# Patient Record
Sex: Male | Born: 1975 | Race: Black or African American | Hispanic: No | Marital: Single | Smoking: Former smoker
Health system: Southern US, Community
[De-identification: ages and names within clinical notes are randomized; demographics above are authoritative.]

## PROBLEM LIST (undated history)

## (undated) DIAGNOSIS — K219 Gastro-esophageal reflux disease without esophagitis: Secondary | ICD-10-CM

## (undated) DIAGNOSIS — N183 Chronic kidney disease, stage 3 unspecified: Secondary | ICD-10-CM

## (undated) DIAGNOSIS — I10 Essential (primary) hypertension: Secondary | ICD-10-CM

## (undated) DIAGNOSIS — R0600 Dyspnea, unspecified: Secondary | ICD-10-CM

## (undated) DIAGNOSIS — K859 Acute pancreatitis without necrosis or infection, unspecified: Secondary | ICD-10-CM

## (undated) DIAGNOSIS — I219 Acute myocardial infarction, unspecified: Secondary | ICD-10-CM

## (undated) DIAGNOSIS — I509 Heart failure, unspecified: Secondary | ICD-10-CM

## (undated) DIAGNOSIS — J449 Chronic obstructive pulmonary disease, unspecified: Secondary | ICD-10-CM

## (undated) DIAGNOSIS — I471 Supraventricular tachycardia, unspecified: Secondary | ICD-10-CM

## (undated) DIAGNOSIS — G4730 Sleep apnea, unspecified: Secondary | ICD-10-CM

## (undated) DIAGNOSIS — I214 Non-ST elevation (NSTEMI) myocardial infarction: Secondary | ICD-10-CM

## (undated) DIAGNOSIS — E669 Obesity, unspecified: Secondary | ICD-10-CM

## (undated) DIAGNOSIS — M109 Gout, unspecified: Secondary | ICD-10-CM

## (undated) DIAGNOSIS — I700 Atherosclerosis of aorta: Secondary | ICD-10-CM

## (undated) DIAGNOSIS — Z789 Other specified health status: Secondary | ICD-10-CM

## (undated) DIAGNOSIS — I428 Other cardiomyopathies: Secondary | ICD-10-CM

## (undated) DIAGNOSIS — Z95810 Presence of automatic (implantable) cardiac defibrillator: Secondary | ICD-10-CM

## (undated) DIAGNOSIS — E119 Type 2 diabetes mellitus without complications: Secondary | ICD-10-CM

## (undated) DIAGNOSIS — R0902 Hypoxemia: Secondary | ICD-10-CM

## (undated) DIAGNOSIS — I2510 Atherosclerotic heart disease of native coronary artery without angina pectoris: Secondary | ICD-10-CM

## (undated) DIAGNOSIS — I480 Paroxysmal atrial fibrillation: Secondary | ICD-10-CM

## (undated) DIAGNOSIS — J9610 Chronic respiratory failure, unspecified whether with hypoxia or hypercapnia: Secondary | ICD-10-CM

## (undated) DIAGNOSIS — J45909 Unspecified asthma, uncomplicated: Secondary | ICD-10-CM

## (undated) DIAGNOSIS — G4733 Obstructive sleep apnea (adult) (pediatric): Secondary | ICD-10-CM

## (undated) DIAGNOSIS — I420 Dilated cardiomyopathy: Secondary | ICD-10-CM

## (undated) DIAGNOSIS — K449 Diaphragmatic hernia without obstruction or gangrene: Secondary | ICD-10-CM

## (undated) DIAGNOSIS — E785 Hyperlipidemia, unspecified: Secondary | ICD-10-CM

## (undated) DIAGNOSIS — N189 Chronic kidney disease, unspecified: Secondary | ICD-10-CM

## (undated) DIAGNOSIS — Y639 Failure in dosage during unspecified surgical and medical care: Secondary | ICD-10-CM

## (undated) DIAGNOSIS — I499 Cardiac arrhythmia, unspecified: Secondary | ICD-10-CM

## (undated) DIAGNOSIS — Z9889 Other specified postprocedural states: Secondary | ICD-10-CM

## (undated) DIAGNOSIS — H9191 Unspecified hearing loss, right ear: Secondary | ICD-10-CM

## (undated) DIAGNOSIS — I5020 Unspecified systolic (congestive) heart failure: Secondary | ICD-10-CM

## (undated) DIAGNOSIS — I429 Cardiomyopathy, unspecified: Secondary | ICD-10-CM

## (undated) DIAGNOSIS — I447 Left bundle-branch block, unspecified: Secondary | ICD-10-CM

## (undated) HISTORY — PX: IMPLANTABLE CARDIOVERTER DEFIBRILLATOR IMPLANT: SHX5860

## (undated) HISTORY — DX: Unspecified asthma, uncomplicated: J45.909

## (undated) HISTORY — DX: Hyperlipidemia, unspecified: E78.5

## (undated) HISTORY — PX: TONSILLECTOMY: SUR1361

## (undated) HISTORY — PX: INSERT / REPLACE / REMOVE PACEMAKER: SUR710

## (undated) HISTORY — DX: Unspecified hearing loss, right ear: H91.91

## (undated) HISTORY — DX: Type 2 diabetes mellitus without complications: E11.9

---

## 2006-01-31 ENCOUNTER — Emergency Department: Payer: Self-pay | Admitting: Internal Medicine

## 2006-04-23 ENCOUNTER — Emergency Department: Payer: Self-pay | Admitting: Emergency Medicine

## 2009-12-13 ENCOUNTER — Inpatient Hospital Stay: Payer: Self-pay | Admitting: Internal Medicine

## 2009-12-14 HISTORY — PX: RIGHT/LEFT HEART CATH AND CORONARY ANGIOGRAPHY: CATH118266

## 2011-01-30 ENCOUNTER — Inpatient Hospital Stay: Payer: Self-pay | Admitting: Internal Medicine

## 2012-02-06 ENCOUNTER — Inpatient Hospital Stay: Payer: Self-pay | Admitting: Internal Medicine

## 2012-02-06 LAB — COMPREHENSIVE METABOLIC PANEL
Albumin: 3.5 g/dL (ref 3.4–5.0)
Alkaline Phosphatase: 179 U/L — ABNORMAL HIGH (ref 50–136)
Anion Gap: 9 (ref 7–16)
BUN: 12 mg/dL (ref 7–18)
Calcium, Total: 8.3 mg/dL — ABNORMAL LOW (ref 8.5–10.1)
Chloride: 103 mmol/L (ref 98–107)
EGFR (African American): >60
Glucose: 110 mg/dL — ABNORMAL HIGH (ref 65–99)
Osmolality: 278 (ref 275–301)
Potassium: 3.8 mmol/L (ref 3.5–5.1)
SGOT(AST): 62 U/L — ABNORMAL HIGH (ref 15–37)
Sodium: 139 mmol/L (ref 136–145)
Total Protein: 8.4 g/dL — ABNORMAL HIGH (ref 6.4–8.2)

## 2012-02-06 LAB — URINALYSIS, COMPLETE
Bilirubin,UR: NEGATIVE
Glucose,UR: NEGATIVE mg/dL (ref 0–75)
Ketone: NEGATIVE
Leukocyte Esterase: NEGATIVE
Ph: 5 (ref 4.5–8.0)
Protein: 100
Specific Gravity: 1.006 (ref 1.003–1.030)
WBC UR: 4 /HPF (ref 0–5)

## 2012-02-06 LAB — CBC
HCT: 46.1 % (ref 40.0–52.0)
HGB: 15.6 g/dL (ref 13.0–18.0)
MCH: 32.2 pg (ref 26.0–34.0)
MCHC: 33.8 g/dL (ref 32.0–36.0)
MCV: 95 fL (ref 80–100)
Platelet: 169 10*3/uL (ref 150–440)
RBC: 4.83 10*6/uL (ref 4.40–5.90)
RDW: 13.7 % (ref 11.5–14.5)
WBC: 6.7 10*3/uL (ref 3.8–10.6)

## 2012-02-06 LAB — TROPONIN I
Troponin-I: 0.1 ng/mL — ABNORMAL HIGH
Troponin-I: 0.09 ng/mL — ABNORMAL HIGH

## 2012-02-06 LAB — LIPASE, BLOOD: Lipase: 161 U/L (ref 73–393)

## 2012-02-06 LAB — PRO B NATRIURETIC PEPTIDE: B-Type Natriuretic Peptide: 405 pg/mL — ABNORMAL HIGH (ref 0–125)

## 2012-02-06 LAB — CK TOTAL AND CKMB (NOT AT ARMC)
CK-MB: 3.9 ng/mL — ABNORMAL HIGH (ref 0.5–3.6)
CK-MB: 4.2 ng/mL — ABNORMAL HIGH (ref 0.5–3.6)
CK-MB: 3.3 ng/mL (ref 0.5–3.6)

## 2012-02-07 LAB — LIPID PANEL
Cholesterol: 256 mg/dL — ABNORMAL HIGH (ref 0–200)
HDL Cholesterol: 40 mg/dL (ref 40–60)
Ldl Cholesterol, Calc: 203 mg/dL — ABNORMAL HIGH (ref 0–100)
VLDL Cholesterol, Calc: 13 mg/dL (ref 5–40)

## 2012-02-07 LAB — CBC WITH DIFFERENTIAL/PLATELET
Basophil %: 0 %
HGB: 16.4 g/dL (ref 13.0–18.0)
Lymphocyte %: 17.7 %
MCH: 32.3 pg (ref 26.0–34.0)
MCHC: 33.7 g/dL (ref 32.0–36.0)
Monocyte #: 0 x10 3/mm — ABNORMAL LOW (ref 0.2–1.0)
Monocyte %: 1 %
Neutrophil %: 81.3 %
Platelet: 204 10*3/uL (ref 150–440)
RBC: 5.07 10*6/uL (ref 4.40–5.90)

## 2012-02-07 LAB — COMPREHENSIVE METABOLIC PANEL
Alkaline Phosphatase: 176 U/L — ABNORMAL HIGH (ref 50–136)
Anion Gap: 7 (ref 7–16)
Bilirubin,Total: 0.3 mg/dL (ref 0.2–1.0)
Co2: 31 mmol/L (ref 21–32)
Creatinine: 0.84 mg/dL (ref 0.60–1.30)
EGFR (Non-African Amer.): >60
Glucose: 146 mg/dL — ABNORMAL HIGH (ref 65–99)
SGOT(AST): 47 U/L — ABNORMAL HIGH (ref 15–37)
SGPT (ALT): 83 U/L — ABNORMAL HIGH

## 2012-02-07 LAB — TSH: Thyroid Stimulating Horm: 0.275 u[IU]/mL — ABNORMAL LOW

## 2012-02-07 LAB — T4, FREE: Free Thyroxine: 0.99 ng/dL (ref 0.76–1.46)

## 2012-02-07 LAB — PROTIME-INR: Prothrombin Time: 13.5 secs (ref 11.5–14.7)

## 2012-02-07 LAB — HEMOGLOBIN A1C: Hemoglobin A1C: 6.6 % — ABNORMAL HIGH (ref 4.2–6.3)

## 2012-02-20 ENCOUNTER — Emergency Department: Payer: Self-pay | Admitting: Emergency Medicine

## 2012-02-20 LAB — CBC
HCT: 43.6 % (ref 40.0–52.0)
HGB: 14.6 g/dL (ref 13.0–18.0)
MCH: 32.3 pg (ref 26.0–34.0)
MCHC: 33.4 g/dL (ref 32.0–36.0)
Platelet: 160 10*3/uL (ref 150–440)
RDW: 13.4 % (ref 11.5–14.5)
WBC: 5.5 10*3/uL (ref 3.8–10.6)

## 2012-02-20 LAB — BASIC METABOLIC PANEL
Calcium, Total: 8.4 mg/dL — ABNORMAL LOW (ref 8.5–10.1)
Chloride: 103 mmol/L (ref 98–107)
Co2: 29 mmol/L (ref 21–32)
Creatinine: 1 mg/dL (ref 0.60–1.30)
EGFR (Non-African Amer.): >60
Osmolality: 282 (ref 275–301)
Potassium: 3.9 mmol/L (ref 3.5–5.1)
Sodium: 138 mmol/L (ref 136–145)

## 2012-02-20 LAB — CK TOTAL AND CKMB (NOT AT ARMC)
CK, Total: 162 U/L (ref 35–232)
CK-MB: 3.5 ng/mL (ref 0.5–3.6)

## 2012-02-25 ENCOUNTER — Emergency Department: Payer: Self-pay | Admitting: *Deleted

## 2012-02-25 LAB — CBC
MCH: 32.4 pg (ref 26.0–34.0)
MCHC: 33.8 g/dL (ref 32.0–36.0)
MCV: 96 fL (ref 80–100)
Platelet: 151 10*3/uL (ref 150–440)
RBC: 4.55 10*6/uL (ref 4.40–5.90)
RDW: 13.7 % (ref 11.5–14.5)

## 2012-02-25 LAB — COMPREHENSIVE METABOLIC PANEL
Albumin: 3.3 g/dL — ABNORMAL LOW (ref 3.4–5.0)
Alkaline Phosphatase: 135 U/L (ref 50–136)
Anion Gap: 5 — ABNORMAL LOW (ref 7–16)
Calcium, Total: 8.8 mg/dL (ref 8.5–10.1)
Co2: 29 mmol/L (ref 21–32)
Creatinine: 0.94 mg/dL (ref 0.60–1.30)
EGFR (African American): >60
EGFR (Non-African Amer.): >60
Osmolality: 280 (ref 275–301)
Potassium: 4.1 mmol/L (ref 3.5–5.1)

## 2012-02-25 LAB — URINALYSIS, COMPLETE
Bacteria: NONE SEEN
Bilirubin,UR: NEGATIVE
Blood: NEGATIVE
Ketone: NEGATIVE
Ph: 5 (ref 4.5–8.0)
Protein: 100
Specific Gravity: 1.016 (ref 1.003–1.030)
Squamous Epithelial: 1

## 2012-02-25 LAB — TROPONIN I: Troponin-I: 0.1 ng/mL — ABNORMAL HIGH

## 2012-12-19 ENCOUNTER — Inpatient Hospital Stay: Payer: Self-pay | Admitting: Internal Medicine

## 2012-12-19 LAB — CK TOTAL AND CKMB (NOT AT ARMC)
CK, Total: 387 U/L — ABNORMAL HIGH (ref 35–232)
CK, Total: 257 U/L — ABNORMAL HIGH (ref 35–232)
CK-MB: 2.8 ng/mL (ref 0.5–3.6)
CK-MB: 2.9 ng/mL (ref 0.5–3.6)

## 2012-12-19 LAB — TROPONIN I
Troponin-I: 0.19 ng/mL — ABNORMAL HIGH
Troponin-I: 0.14 ng/mL — ABNORMAL HIGH
Troponin-I: 0.12 ng/mL — ABNORMAL HIGH

## 2012-12-19 LAB — COMPREHENSIVE METABOLIC PANEL
Alkaline Phosphatase: 116 U/L (ref 50–136)
Anion Gap: 5 — ABNORMAL LOW (ref 7–16)
Bilirubin,Total: 0.2 mg/dL (ref 0.2–1.0)
Co2: 30 mmol/L (ref 21–32)
Creatinine: 0.85 mg/dL (ref 0.60–1.30)
EGFR (Non-African Amer.): >60
Osmolality: 284 (ref 275–301)
Potassium: 3.6 mmol/L (ref 3.5–5.1)
SGPT (ALT): 46 U/L (ref 12–78)
Total Protein: 7.8 g/dL (ref 6.4–8.2)

## 2012-12-19 LAB — CBC
HCT: 42.4 % (ref 40.0–52.0)
MCH: 33 pg (ref 26.0–34.0)
MCHC: 35.1 g/dL (ref 32.0–36.0)
MCV: 94 fL (ref 80–100)
Platelet: 187 10*3/uL (ref 150–440)
RBC: 4.51 10*6/uL (ref 4.40–5.90)
WBC: 6.1 10*3/uL (ref 3.8–10.6)

## 2012-12-19 LAB — LIPASE, BLOOD: Lipase: 2864 U/L — ABNORMAL HIGH (ref 73–393)

## 2012-12-19 LAB — LIPID PANEL: VLDL Cholesterol, Calc: 30 mg/dL (ref 5–40)

## 2013-01-13 ENCOUNTER — Ambulatory Visit: Payer: Self-pay | Admitting: Adult Health

## 2013-03-15 ENCOUNTER — Ambulatory Visit: Payer: Self-pay | Admitting: Adult Health

## 2013-07-19 ENCOUNTER — Emergency Department: Payer: Self-pay | Admitting: Emergency Medicine

## 2014-04-14 ENCOUNTER — Emergency Department: Payer: Self-pay | Admitting: Emergency Medicine

## 2014-04-14 LAB — CBC
HCT: 46.4 % (ref 40.0–52.0)
HGB: 15.7 g/dL (ref 13.0–18.0)
MCH: 32.6 pg (ref 26.0–34.0)
MCHC: 33.7 g/dL (ref 32.0–36.0)
MCV: 97 fL (ref 80–100)
PLATELETS: 192 10*3/uL (ref 150–440)
RBC: 4.8 10*6/uL (ref 4.40–5.90)
RDW: 13.6 % (ref 11.5–14.5)
WBC: 6 10*3/uL (ref 3.8–10.6)

## 2014-04-14 LAB — BASIC METABOLIC PANEL
Anion Gap: 11 (ref 7–16)
BUN: 16 mg/dL (ref 7–18)
CO2: 28 mmol/L (ref 21–32)
CREATININE: 0.88 mg/dL (ref 0.60–1.30)
Calcium, Total: 8.5 mg/dL (ref 8.5–10.1)
Chloride: 100 mmol/L (ref 98–107)
EGFR (African American): >60
EGFR (Non-African Amer.): >60
Glucose: 97 mg/dL (ref 65–99)
OSMOLALITY: 279 (ref 275–301)
POTASSIUM: 3.5 mmol/L (ref 3.5–5.1)
Sodium: 139 mmol/L (ref 136–145)

## 2014-04-14 LAB — HEPATIC FUNCTION PANEL A (ARMC)
ALT: 42 U/L
AST: 46 U/L — AB (ref 15–37)
Albumin: 3.5 g/dL (ref 3.4–5.0)
Alkaline Phosphatase: 119 U/L — ABNORMAL HIGH
Bilirubin, Direct: 0.1 mg/dL (ref 0.00–0.20)
Bilirubin,Total: 0.4 mg/dL (ref 0.2–1.0)
TOTAL PROTEIN: 8.7 g/dL — AB (ref 6.4–8.2)

## 2014-04-14 LAB — LIPASE, BLOOD: Lipase: 195 U/L (ref 73–393)

## 2014-04-14 LAB — TROPONIN I
Troponin-I: 0.1 ng/mL — ABNORMAL HIGH
Troponin-I: 0.11 ng/mL — ABNORMAL HIGH

## 2014-04-18 ENCOUNTER — Inpatient Hospital Stay: Payer: Self-pay | Admitting: Internal Medicine

## 2014-04-18 LAB — CBC WITH DIFFERENTIAL/PLATELET
BASOS PCT: 0.4 %
Basophil #: 0 10*3/uL (ref 0.0–0.1)
Eosinophil #: 0.2 10*3/uL (ref 0.0–0.7)
Eosinophil %: 3.6 %
HCT: 45.5 % (ref 40.0–52.0)
HGB: 15.4 g/dL (ref 13.0–18.0)
Lymphocyte #: 1.7 10*3/uL (ref 1.0–3.6)
Lymphocyte %: 34.3 %
MCH: 32.6 pg (ref 26.0–34.0)
MCHC: 33.8 g/dL (ref 32.0–36.0)
MCV: 96 fL (ref 80–100)
Monocyte #: 0.3 x10 3/mm (ref 0.2–1.0)
Monocyte %: 5.6 %
NEUTROS PCT: 56.1 %
Neutrophil #: 2.9 10*3/uL (ref 1.4–6.5)
Platelet: 178 10*3/uL (ref 150–440)
RBC: 4.73 10*6/uL (ref 4.40–5.90)
RDW: 13.4 % (ref 11.5–14.5)
WBC: 5.1 10*3/uL (ref 3.8–10.6)

## 2014-04-18 LAB — URINALYSIS, COMPLETE
BACTERIA: NONE SEEN
Blood: NEGATIVE
Glucose,UR: NEGATIVE mg/dL (ref 0–75)
KETONE: NEGATIVE
Nitrite: NEGATIVE
Ph: 5 (ref 4.5–8.0)
RBC,UR: 23 /HPF (ref 0–5)
Specific Gravity: 1.018 (ref 1.003–1.030)
Squamous Epithelial: 12

## 2014-04-18 LAB — COMPREHENSIVE METABOLIC PANEL
AST: 130 U/L — AB (ref 15–37)
Albumin: 3.3 g/dL — ABNORMAL LOW (ref 3.4–5.0)
Alkaline Phosphatase: 202 U/L — ABNORMAL HIGH
Anion Gap: 10 (ref 7–16)
BUN: 17 mg/dL (ref 7–18)
Bilirubin,Total: 1.6 mg/dL — ABNORMAL HIGH (ref 0.2–1.0)
CALCIUM: 8.4 mg/dL — AB (ref 8.5–10.1)
CHLORIDE: 101 mmol/L (ref 98–107)
Co2: 29 mmol/L (ref 21–32)
Creatinine: 1 mg/dL (ref 0.60–1.30)
EGFR (Non-African Amer.): >60
Glucose: 102 mg/dL — ABNORMAL HIGH (ref 65–99)
Osmolality: 281 (ref 275–301)
POTASSIUM: 3.6 mmol/L (ref 3.5–5.1)
SGPT (ALT): 219 U/L — ABNORMAL HIGH
SODIUM: 140 mmol/L (ref 136–145)
Total Protein: 8.3 g/dL — ABNORMAL HIGH (ref 6.4–8.2)

## 2014-04-18 LAB — LIPASE, BLOOD: Lipase: 4866 U/L — ABNORMAL HIGH (ref 73–393)

## 2014-04-18 LAB — TROPONIN I: TROPONIN-I: 0.08 ng/mL — AB

## 2014-04-19 LAB — CBC WITH DIFFERENTIAL/PLATELET
Basophil #: 0 10*3/uL (ref 0.0–0.1)
Basophil %: 0.3 %
EOS ABS: 0.2 10*3/uL (ref 0.0–0.7)
Eosinophil %: 4 %
HCT: 44.4 % (ref 40.0–52.0)
HGB: 15.5 g/dL (ref 13.0–18.0)
Lymphocyte #: 2 10*3/uL (ref 1.0–3.6)
Lymphocyte %: 37.3 %
MCH: 33.8 pg (ref 26.0–34.0)
MCHC: 35 g/dL (ref 32.0–36.0)
MCV: 97 fL (ref 80–100)
MONO ABS: 0.3 x10 3/mm (ref 0.2–1.0)
MONOS PCT: 6 %
NEUTROS ABS: 2.9 10*3/uL (ref 1.4–6.5)
NEUTROS PCT: 52.4 %
PLATELETS: 163 10*3/uL (ref 150–440)
RBC: 4.59 10*6/uL (ref 4.40–5.90)
RDW: 13.5 % (ref 11.5–14.5)
WBC: 5.5 10*3/uL (ref 3.8–10.6)

## 2014-04-19 LAB — COMPREHENSIVE METABOLIC PANEL
ALBUMIN: 3.1 g/dL — AB (ref 3.4–5.0)
ALT: 168 U/L — AB
ANION GAP: 10 (ref 7–16)
Alkaline Phosphatase: 185 U/L — ABNORMAL HIGH
BUN: 14 mg/dL (ref 7–18)
Bilirubin,Total: 1.1 mg/dL — ABNORMAL HIGH (ref 0.2–1.0)
Calcium, Total: 7.9 mg/dL — ABNORMAL LOW (ref 8.5–10.1)
Chloride: 101 mmol/L (ref 98–107)
Co2: 30 mmol/L (ref 21–32)
Creatinine: 0.84 mg/dL (ref 0.60–1.30)
EGFR (African American): >60
EGFR (Non-African Amer.): >60
GLUCOSE: 79 mg/dL (ref 65–99)
Osmolality: 281 (ref 275–301)
Potassium: 3.7 mmol/L (ref 3.5–5.1)
SGOT(AST): 91 U/L — ABNORMAL HIGH (ref 15–37)
SODIUM: 141 mmol/L (ref 136–145)
TOTAL PROTEIN: 8 g/dL (ref 6.4–8.2)

## 2014-04-19 LAB — LIPID PANEL
Cholesterol: 220 mg/dL — ABNORMAL HIGH (ref 0–200)
HDL: 26 mg/dL — AB (ref 40–60)
LDL CHOLESTEROL, CALC: 166 mg/dL — AB (ref 0–100)
Triglycerides: 141 mg/dL (ref 0–200)
VLDL Cholesterol, Calc: 28 mg/dL (ref 5–40)

## 2014-04-19 LAB — LIPASE, BLOOD: Lipase: 1683 U/L — ABNORMAL HIGH (ref 73–393)

## 2014-04-19 LAB — TSH: THYROID STIMULATING HORM: 0.91 u[IU]/mL

## 2014-04-19 LAB — URINE CULTURE

## 2014-08-03 ENCOUNTER — Emergency Department: Payer: Self-pay | Admitting: Emergency Medicine

## 2014-08-03 LAB — CBC
HCT: 48.7 % (ref 40.0–52.0)
HGB: 16.4 g/dL (ref 13.0–18.0)
MCH: 32.8 pg (ref 26.0–34.0)
MCHC: 33.6 g/dL (ref 32.0–36.0)
MCV: 98 fL (ref 80–100)
Platelet: 202 10*3/uL (ref 150–440)
RBC: 4.99 10*6/uL (ref 4.40–5.90)
RDW: 12.7 % (ref 11.5–14.5)
WBC: 5.9 10*3/uL (ref 3.8–10.6)

## 2014-08-03 LAB — URINALYSIS, COMPLETE
BILIRUBIN, UR: NEGATIVE
Bacteria: NONE SEEN
Glucose,UR: NEGATIVE mg/dL (ref 0–75)
Hyaline Cast: 2
Ketone: NEGATIVE
Nitrite: NEGATIVE
Ph: 5 (ref 4.5–8.0)
Protein: 100
RBC,UR: 4 /HPF (ref 0–5)
SPECIFIC GRAVITY: 1.015 (ref 1.003–1.030)
WBC UR: 77 /HPF (ref 0–5)

## 2014-08-03 LAB — COMPREHENSIVE METABOLIC PANEL
ANION GAP: 5 — AB (ref 7–16)
Albumin: 3.3 g/dL — ABNORMAL LOW (ref 3.4–5.0)
Alkaline Phosphatase: 144 U/L — ABNORMAL HIGH
BILIRUBIN TOTAL: 0.3 mg/dL (ref 0.2–1.0)
BUN: 18 mg/dL (ref 7–18)
CALCIUM: 8.4 mg/dL — AB (ref 8.5–10.1)
Chloride: 101 mmol/L (ref 98–107)
Co2: 31 mmol/L (ref 21–32)
Creatinine: 1.03 mg/dL (ref 0.60–1.30)
EGFR (Non-African Amer.): >60
Glucose: 215 mg/dL — ABNORMAL HIGH (ref 65–99)
Osmolality: 282 (ref 275–301)
Potassium: 4.2 mmol/L (ref 3.5–5.1)
SGOT(AST): 44 U/L — ABNORMAL HIGH (ref 15–37)
SGPT (ALT): 51 U/L
Sodium: 137 mmol/L (ref 136–145)
Total Protein: 8.3 g/dL — ABNORMAL HIGH (ref 6.4–8.2)

## 2014-08-03 LAB — LIPASE, BLOOD: LIPASE: 587 U/L — AB (ref 73–393)

## 2014-08-10 ENCOUNTER — Inpatient Hospital Stay: Payer: Self-pay | Admitting: Internal Medicine

## 2014-08-10 LAB — URINALYSIS, COMPLETE
BACTERIA: NONE SEEN
BLOOD: NEGATIVE
Bilirubin,UR: NEGATIVE
Glucose,UR: NEGATIVE mg/dL (ref 0–75)
Ketone: NEGATIVE
NITRITE: NEGATIVE
PH: 7 (ref 4.5–8.0)
RBC,UR: 5 /HPF (ref 0–5)
Specific Gravity: 1.015 (ref 1.003–1.030)

## 2014-08-10 LAB — CBC WITH DIFFERENTIAL/PLATELET
BASOS PCT: 0.4 %
Basophil #: 0 10*3/uL (ref 0.0–0.1)
Eosinophil #: 0.2 10*3/uL (ref 0.0–0.7)
Eosinophil %: 3.2 %
HCT: 47.7 % (ref 40.0–52.0)
HGB: 16.3 g/dL (ref 13.0–18.0)
LYMPHS PCT: 45.8 %
Lymphocyte #: 2.9 10*3/uL (ref 1.0–3.6)
MCH: 32.9 pg (ref 26.0–34.0)
MCHC: 34.2 g/dL (ref 32.0–36.0)
MCV: 96 fL (ref 80–100)
MONOS PCT: 7.5 %
Monocyte #: 0.5 x10 3/mm (ref 0.2–1.0)
Neutrophil #: 2.7 10*3/uL (ref 1.4–6.5)
Neutrophil %: 43.1 %
PLATELETS: 202 10*3/uL (ref 150–440)
RBC: 4.96 10*6/uL (ref 4.40–5.90)
RDW: 12.4 % (ref 11.5–14.5)
WBC: 6.4 10*3/uL (ref 3.8–10.6)

## 2014-08-10 LAB — COMPREHENSIVE METABOLIC PANEL
ALT: 41 U/L
Albumin: 3.3 g/dL — ABNORMAL LOW (ref 3.4–5.0)
Alkaline Phosphatase: 153 U/L — ABNORMAL HIGH
Anion Gap: 4 — ABNORMAL LOW (ref 7–16)
BUN: 15 mg/dL (ref 7–18)
Bilirubin,Total: 0.3 mg/dL (ref 0.2–1.0)
CO2: 33 mmol/L — AB (ref 21–32)
Calcium, Total: 8.6 mg/dL (ref 8.5–10.1)
Chloride: 101 mmol/L (ref 98–107)
Creatinine: 1.21 mg/dL (ref 0.60–1.30)
EGFR (African American): >60
GLUCOSE: 131 mg/dL — AB (ref 65–99)
Osmolality: 278 (ref 275–301)
Potassium: 4.2 mmol/L (ref 3.5–5.1)
SGOT(AST): 42 U/L — ABNORMAL HIGH (ref 15–37)
Sodium: 138 mmol/L (ref 136–145)
Total Protein: 8.5 g/dL — ABNORMAL HIGH (ref 6.4–8.2)

## 2014-08-10 LAB — LIPASE, BLOOD: LIPASE: 4121 U/L — AB (ref 73–393)

## 2014-08-10 LAB — TROPONIN I: Troponin-I: 0.12 ng/mL — ABNORMAL HIGH

## 2014-08-11 LAB — BASIC METABOLIC PANEL
Anion Gap: 4 — ABNORMAL LOW (ref 7–16)
BUN: 11 mg/dL (ref 7–18)
CO2: 30 mmol/L (ref 21–32)
Calcium, Total: 8.4 mg/dL — ABNORMAL LOW (ref 8.5–10.1)
Chloride: 103 mmol/L (ref 98–107)
Creatinine: 0.77 mg/dL (ref 0.60–1.30)
EGFR (African American): >60
Glucose: 132 mg/dL — ABNORMAL HIGH (ref 65–99)
OSMOLALITY: 275 (ref 275–301)
Potassium: 4.1 mmol/L (ref 3.5–5.1)
Sodium: 137 mmol/L (ref 136–145)

## 2014-08-11 LAB — COMPREHENSIVE METABOLIC PANEL
ALK PHOS: 130 U/L — AB
AST: 35 U/L (ref 15–37)
Albumin: 3 g/dL — ABNORMAL LOW (ref 3.4–5.0)
Anion Gap: 8 (ref 7–16)
BILIRUBIN TOTAL: 0.2 mg/dL (ref 0.2–1.0)
BUN: 18 mg/dL (ref 7–18)
CO2: 25 mmol/L (ref 21–32)
CREATININE: 0.97 mg/dL (ref 0.60–1.30)
Calcium, Total: 8.1 mg/dL — ABNORMAL LOW (ref 8.5–10.1)
Chloride: 104 mmol/L (ref 98–107)
EGFR (African American): >60
EGFR (Non-African Amer.): >60
GLUCOSE: 163 mg/dL — AB (ref 65–99)
Osmolality: 279 (ref 275–301)
Potassium: 3.7 mmol/L (ref 3.5–5.1)
SGPT (ALT): 37 U/L
Sodium: 137 mmol/L (ref 136–145)
Total Protein: 7.6 g/dL (ref 6.4–8.2)

## 2014-08-11 LAB — LIPID PANEL
Cholesterol: 237 mg/dL — ABNORMAL HIGH (ref 0–200)
HDL: 31 mg/dL — AB (ref 40–60)
Ldl Cholesterol, Calc: 186 mg/dL — ABNORMAL HIGH (ref 0–100)
Triglycerides: 101 mg/dL (ref 0–200)
VLDL Cholesterol, Calc: 20 mg/dL (ref 5–40)

## 2014-08-11 LAB — LIPASE, BLOOD
Lipase: 2467 U/L — ABNORMAL HIGH (ref 73–393)
Lipase: 5151 U/L — ABNORMAL HIGH (ref 73–393)

## 2014-08-11 LAB — CBC WITH DIFFERENTIAL/PLATELET
BASOS ABS: 0.1 10*3/uL (ref 0.0–0.1)
Basophil %: 0.9 %
Eosinophil #: 0.2 10*3/uL (ref 0.0–0.7)
Eosinophil %: 3.3 %
HCT: 44.7 % (ref 40.0–52.0)
HGB: 15 g/dL (ref 13.0–18.0)
Lymphocyte #: 2.6 10*3/uL (ref 1.0–3.6)
Lymphocyte %: 44.8 %
MCH: 32.6 pg (ref 26.0–34.0)
MCHC: 33.6 g/dL (ref 32.0–36.0)
MCV: 97 fL (ref 80–100)
MONO ABS: 0.4 x10 3/mm (ref 0.2–1.0)
Monocyte %: 6.1 %
Neutrophil #: 2.6 10*3/uL (ref 1.4–6.5)
Neutrophil %: 44.9 %
Platelet: 166 10*3/uL (ref 150–440)
RBC: 4.59 10*6/uL (ref 4.40–5.90)
RDW: 12.9 % (ref 11.5–14.5)
WBC: 5.8 10*3/uL (ref 3.8–10.6)

## 2014-08-11 LAB — CK-MB: CK-MB: 2 ng/mL (ref 0.5–3.6)

## 2014-08-11 LAB — TROPONIN I: Troponin-I: 0.13 ng/mL — ABNORMAL HIGH

## 2014-08-12 LAB — URINE CULTURE

## 2014-08-21 ENCOUNTER — Inpatient Hospital Stay: Payer: Self-pay | Admitting: Specialist

## 2014-08-21 LAB — COMPREHENSIVE METABOLIC PANEL
ALK PHOS: 262 U/L — AB
ANION GAP: 7 (ref 7–16)
Albumin: 3.3 g/dL — ABNORMAL LOW (ref 3.4–5.0)
BUN: 21 mg/dL — AB (ref 7–18)
Bilirubin,Total: 0.4 mg/dL (ref 0.2–1.0)
CREATININE: 1.02 mg/dL (ref 0.60–1.30)
Calcium, Total: 8.9 mg/dL (ref 8.5–10.1)
Chloride: 100 mmol/L (ref 98–107)
Co2: 29 mmol/L (ref 21–32)
EGFR (African American): >60
Glucose: 138 mg/dL — ABNORMAL HIGH (ref 65–99)
OSMOLALITY: 277 (ref 275–301)
Potassium: 4.8 mmol/L (ref 3.5–5.1)
SGOT(AST): 133 U/L — ABNORMAL HIGH (ref 15–37)
SGPT (ALT): 136 U/L — ABNORMAL HIGH
SODIUM: 136 mmol/L (ref 136–145)
Total Protein: 8.7 g/dL — ABNORMAL HIGH (ref 6.4–8.2)

## 2014-08-21 LAB — TROPONIN I
Troponin-I: 0.11 ng/mL — ABNORMAL HIGH
Troponin-I: 0.09 ng/mL — ABNORMAL HIGH
Troponin-I: 0.07 ng/mL — ABNORMAL HIGH

## 2014-08-21 LAB — CBC
HCT: 47.6 % (ref 40.0–52.0)
HGB: 16.5 g/dL (ref 13.0–18.0)
MCH: 32.8 pg (ref 26.0–34.0)
MCHC: 34.6 g/dL (ref 32.0–36.0)
MCV: 95 fL (ref 80–100)
PLATELETS: 192 10*3/uL (ref 150–440)
RBC: 5.03 10*6/uL (ref 4.40–5.90)
RDW: 12.4 % (ref 11.5–14.5)
WBC: 4.8 10*3/uL (ref 3.8–10.6)

## 2014-08-21 LAB — URINALYSIS, COMPLETE
BILIRUBIN, UR: NEGATIVE
BLOOD: NEGATIVE
Glucose,UR: NEGATIVE mg/dL (ref 0–75)
Ketone: NEGATIVE
NITRITE: NEGATIVE
Ph: 5 (ref 4.5–8.0)
Protein: 100
Specific Gravity: 1.015 (ref 1.003–1.030)
Squamous Epithelial: 2

## 2014-08-21 LAB — LIPASE, BLOOD: Lipase: 9295 U/L — ABNORMAL HIGH (ref 73–393)

## 2014-08-21 LAB — ETHANOL

## 2014-08-22 LAB — CBC WITH DIFFERENTIAL/PLATELET
BASOS ABS: 0 10*3/uL (ref 0.0–0.1)
Basophil %: 0.2 %
EOS ABS: 0.1 10*3/uL (ref 0.0–0.7)
Eosinophil %: 3 %
HCT: 43.1 % (ref 40.0–52.0)
HGB: 14.3 g/dL (ref 13.0–18.0)
LYMPHS PCT: 51 %
Lymphocyte #: 2.5 10*3/uL (ref 1.0–3.6)
MCH: 32.4 pg (ref 26.0–34.0)
MCHC: 33.1 g/dL (ref 32.0–36.0)
MCV: 98 fL (ref 80–100)
MONO ABS: 0.3 x10 3/mm (ref 0.2–1.0)
MONOS PCT: 6.3 %
Neutrophil #: 2 10*3/uL (ref 1.4–6.5)
Neutrophil %: 39.5 %
PLATELETS: 179 10*3/uL (ref 150–440)
RBC: 4.41 10*6/uL (ref 4.40–5.90)
RDW: 12.6 % (ref 11.5–14.5)
WBC: 5 10*3/uL (ref 3.8–10.6)

## 2014-08-22 LAB — COMPREHENSIVE METABOLIC PANEL
Albumin: 2.9 g/dL — ABNORMAL LOW (ref 3.4–5.0)
Alkaline Phosphatase: 209 U/L — ABNORMAL HIGH
Anion Gap: 5 — ABNORMAL LOW (ref 7–16)
BILIRUBIN TOTAL: 0.4 mg/dL (ref 0.2–1.0)
BUN: 21 mg/dL — ABNORMAL HIGH (ref 7–18)
CALCIUM: 8 mg/dL — AB (ref 8.5–10.1)
CO2: 27 mmol/L (ref 21–32)
Chloride: 103 mmol/L (ref 98–107)
Creatinine: 1.08 mg/dL (ref 0.60–1.30)
Glucose: 138 mg/dL — ABNORMAL HIGH (ref 65–99)
OSMOLALITY: 275 (ref 275–301)
Potassium: 4.1 mmol/L (ref 3.5–5.1)
SGOT(AST): 68 U/L — ABNORMAL HIGH (ref 15–37)
SGPT (ALT): 100 U/L — ABNORMAL HIGH
SODIUM: 135 mmol/L — AB (ref 136–145)
Total Protein: 7.5 g/dL (ref 6.4–8.2)

## 2014-08-22 LAB — LIPASE, BLOOD: LIPASE: 482 U/L — AB (ref 73–393)

## 2014-12-09 ENCOUNTER — Encounter (HOSPITAL_COMMUNITY)
Admission: EM | Disposition: A | Discharge status: Home / Self Care | Payer: Self-pay | Source: Other Acute Inpatient Hospital | Attending: Cardiology

## 2014-12-09 ENCOUNTER — Emergency Department (HOSPITAL_COMMUNITY): Payer: Self-pay

## 2014-12-09 ENCOUNTER — Inpatient Hospital Stay (HOSPITAL_COMMUNITY)
Admission: EM | Admit: 2014-12-09 | Discharge: 2014-12-13 | DRG: 280 | Disposition: A | Discharge status: Home / Self Care | Payer: Self-pay | Source: Other Acute Inpatient Hospital | Attending: Cardiology | Admitting: Cardiology

## 2014-12-09 ENCOUNTER — Encounter (HOSPITAL_COMMUNITY): Payer: Self-pay | Admitting: Cardiology

## 2014-12-09 ENCOUNTER — Other Ambulatory Visit (HOSPITAL_COMMUNITY): Payer: Self-pay

## 2014-12-09 ENCOUNTER — Other Ambulatory Visit: Payer: Self-pay

## 2014-12-09 DIAGNOSIS — I429 Cardiomyopathy, unspecified: Secondary | ICD-10-CM | POA: Diagnosis present

## 2014-12-09 DIAGNOSIS — I2510 Atherosclerotic heart disease of native coronary artery without angina pectoris: Secondary | ICD-10-CM | POA: Diagnosis present

## 2014-12-09 DIAGNOSIS — Z6841 Body Mass Index (BMI) 40.0 and over, adult: Secondary | ICD-10-CM

## 2014-12-09 DIAGNOSIS — I214 Non-ST elevation (NSTEMI) myocardial infarction: Secondary | ICD-10-CM

## 2014-12-09 DIAGNOSIS — I471 Supraventricular tachycardia, unspecified: Secondary | ICD-10-CM | POA: Diagnosis present

## 2014-12-09 DIAGNOSIS — R079 Chest pain, unspecified: Secondary | ICD-10-CM

## 2014-12-09 DIAGNOSIS — I129 Hypertensive chronic kidney disease with stage 1 through stage 4 chronic kidney disease, or unspecified chronic kidney disease: Secondary | ICD-10-CM | POA: Diagnosis present

## 2014-12-09 DIAGNOSIS — I428 Other cardiomyopathies: Secondary | ICD-10-CM

## 2014-12-09 DIAGNOSIS — F17210 Nicotine dependence, cigarettes, uncomplicated: Secondary | ICD-10-CM | POA: Diagnosis present

## 2014-12-09 DIAGNOSIS — R109 Unspecified abdominal pain: Secondary | ICD-10-CM

## 2014-12-09 DIAGNOSIS — R0902 Hypoxemia: Secondary | ICD-10-CM | POA: Insufficient documentation

## 2014-12-09 DIAGNOSIS — G4733 Obstructive sleep apnea (adult) (pediatric): Secondary | ICD-10-CM | POA: Diagnosis present

## 2014-12-09 DIAGNOSIS — E785 Hyperlipidemia, unspecified: Secondary | ICD-10-CM | POA: Diagnosis present

## 2014-12-09 DIAGNOSIS — N189 Chronic kidney disease, unspecified: Secondary | ICD-10-CM | POA: Diagnosis present

## 2014-12-09 DIAGNOSIS — R7989 Other specified abnormal findings of blood chemistry: Secondary | ICD-10-CM

## 2014-12-09 DIAGNOSIS — R778 Other specified abnormalities of plasma proteins: Secondary | ICD-10-CM | POA: Insufficient documentation

## 2014-12-09 DIAGNOSIS — I10 Essential (primary) hypertension: Secondary | ICD-10-CM

## 2014-12-09 DIAGNOSIS — Z713 Dietary counseling and surveillance: Secondary | ICD-10-CM | POA: Diagnosis present

## 2014-12-09 DIAGNOSIS — K219 Gastro-esophageal reflux disease without esophagitis: Secondary | ICD-10-CM | POA: Diagnosis present

## 2014-12-09 DIAGNOSIS — J9612 Chronic respiratory failure with hypercapnia: Secondary | ICD-10-CM | POA: Diagnosis present

## 2014-12-09 DIAGNOSIS — I5041 Acute combined systolic (congestive) and diastolic (congestive) heart failure: Secondary | ICD-10-CM | POA: Diagnosis present

## 2014-12-09 HISTORY — DX: Essential (primary) hypertension: I10

## 2014-12-09 HISTORY — PX: LEFT HEART CATHETERIZATION WITH CORONARY ANGIOGRAM: SHX5451

## 2014-12-09 HISTORY — DX: Gastro-esophageal reflux disease without esophagitis: K21.9

## 2014-12-09 LAB — COMPREHENSIVE METABOLIC PANEL
ALBUMIN: 3.4 g/dL — AB (ref 3.5–5.2)
ALT: 45 U/L (ref 0–53)
AST: 49 U/L — AB (ref 0–37)
Alkaline Phosphatase: 139 U/L — ABNORMAL HIGH (ref 39–117)
Anion gap: 13 (ref 5–15)
BUN: 20 mg/dL (ref 6–23)
CHLORIDE: 101 mmol/L (ref 96–112)
CO2: 24 mmol/L (ref 19–32)
Calcium: 8.8 mg/dL (ref 8.4–10.5)
Creatinine, Ser: 1.48 mg/dL — ABNORMAL HIGH (ref 0.50–1.35)
GFR calc Af Amer: 67 mL/min — ABNORMAL LOW (ref >90)
GFR calc non Af Amer: 58 mL/min — ABNORMAL LOW (ref >90)
Glucose, Bld: 199 mg/dL — ABNORMAL HIGH (ref 70–99)
Potassium: 3.6 mmol/L (ref 3.5–5.1)
SODIUM: 138 mmol/L (ref 135–145)
TOTAL PROTEIN: 7.2 g/dL (ref 6.0–8.3)
Total Bilirubin: 0.7 mg/dL (ref 0.3–1.2)

## 2014-12-09 LAB — CBC WITH DIFFERENTIAL/PLATELET
Basophils Absolute: 0 10*3/uL (ref 0.0–0.1)
Basophils Relative: 0 % (ref 0–1)
Eosinophils Absolute: 0.1 10*3/uL (ref 0.0–0.7)
Eosinophils Relative: 2 % (ref 0–5)
HCT: 45.8 % (ref 39.0–52.0)
Hemoglobin: 15.9 g/dL (ref 13.0–17.0)
LYMPHS ABS: 1.6 10*3/uL (ref 0.7–4.0)
Lymphocytes Relative: 31 % (ref 12–46)
MCH: 32.3 pg (ref 26.0–34.0)
MCHC: 34.7 g/dL (ref 30.0–36.0)
MCV: 93.1 fL (ref 78.0–100.0)
MONO ABS: 0.3 10*3/uL (ref 0.1–1.0)
Monocytes Relative: 6 % (ref 3–12)
Neutro Abs: 3.2 10*3/uL (ref 1.7–7.7)
Neutrophils Relative %: 61 % (ref 43–77)
PLATELETS: 179 10*3/uL (ref 150–400)
RBC: 4.92 MIL/uL (ref 4.22–5.81)
RDW: 12.7 % (ref 11.5–15.5)
WBC: 5.2 10*3/uL (ref 4.0–10.5)

## 2014-12-09 LAB — I-STAT CG4 LACTIC ACID, ED
Lactic Acid, Venous: 1.61 mmol/L (ref 0.5–2.0)
Lactic Acid, Venous: 0.86 mmol/L (ref 0.5–2.0)

## 2014-12-09 LAB — LIPASE, BLOOD: Lipase: 36 U/L (ref 11–59)

## 2014-12-09 LAB — I-STAT TROPONIN, ED: Troponin i, poc: 0.13 ng/mL (ref 0.00–0.08)

## 2014-12-09 LAB — BRAIN NATRIURETIC PEPTIDE: B NATRIURETIC PEPTIDE 5: 69.1 pg/mL (ref 0.0–100.0)

## 2014-12-09 SURGERY — LEFT HEART CATHETERIZATION WITH CORONARY ANGIOGRAM
Anesthesia: LOCAL

## 2014-12-09 MED ORDER — ONDANSETRON HCL 4 MG/2ML IJ SOLN: 4.0000 mg | Freq: Four times a day (QID) | INTRAMUSCULAR | Status: DC | PRN

## 2014-12-09 MED ORDER — NITROGLYCERIN 0.4 MG SL SUBL: 0.4000 mg | SUBLINGUAL_TABLET | SUBLINGUAL | Status: DC | PRN

## 2014-12-09 MED ORDER — HYDROMORPHONE HCL 1 MG/ML IJ SOLN
INTRAMUSCULAR | Status: AC
  Filled 2014-12-09: qty 1

## 2014-12-09 MED ORDER — NITROGLYCERIN IN D5W 200-5 MCG/ML-% IV SOLN
0.0000 ug/min | INTRAVENOUS | Status: DC
  Administered 2014-12-10: 40 ug/min via INTRAVENOUS
  Filled 2014-12-09 (×2): qty 250

## 2014-12-09 MED ORDER — NITROGLYCERIN IN D5W 200-5 MCG/ML-% IV SOLN
10.0000 ug/min | Freq: Once | INTRAVENOUS | Status: AC
  Administered 2014-12-09: 5 ug/min via INTRAVENOUS
  Filled 2014-12-09: qty 250

## 2014-12-09 MED ORDER — SODIUM CHLORIDE 0.9 % IV BOLUS (SEPSIS)
500.0000 mL | Freq: Once | INTRAVENOUS | Status: AC
  Administered 2014-12-09: 500 mL via INTRAVENOUS

## 2014-12-09 MED ORDER — ASPIRIN EC 81 MG PO TBEC
81.0000 mg | DELAYED_RELEASE_TABLET | Freq: Every day | ORAL | Status: DC
  Administered 2014-12-10: 81 mg via ORAL
  Filled 2014-12-09: qty 1

## 2014-12-09 MED ORDER — HYDROMORPHONE HCL 1 MG/ML IJ SOLN
1.0000 mg | Freq: Once | INTRAMUSCULAR | Status: AC
  Administered 2014-12-09: 1 mg via INTRAVENOUS

## 2014-12-09 MED ORDER — ATORVASTATIN CALCIUM 80 MG PO TABS
80.0000 mg | ORAL_TABLET | Freq: Every day | ORAL | Status: DC
  Administered 2014-12-10 – 2014-12-12 (×4): 80 mg via ORAL
  Filled 2014-12-09 (×5): qty 1

## 2014-12-09 MED ORDER — ASPIRIN 325 MG PO TABS: 325.0000 mg | ORAL_TABLET | Freq: Once | ORAL | Status: DC

## 2014-12-09 MED ORDER — ASPIRIN 300 MG RE SUPP: 300.0000 mg | RECTAL | Status: AC

## 2014-12-09 MED ORDER — ASPIRIN 81 MG PO CHEW: 324.0000 mg | CHEWABLE_TABLET | ORAL | Status: AC

## 2014-12-09 MED ORDER — ACETAMINOPHEN 325 MG PO TABS
650.0000 mg | ORAL_TABLET | ORAL | Status: DC | PRN
  Administered 2014-12-10 – 2014-12-11 (×2): 650 mg via ORAL
  Filled 2014-12-09 (×2): qty 2

## 2014-12-09 MED ORDER — PANTOPRAZOLE SODIUM 40 MG PO TBEC
40.0000 mg | DELAYED_RELEASE_TABLET | Freq: Every day | ORAL | Status: DC
  Administered 2014-12-10 – 2014-12-13 (×4): 40 mg via ORAL
  Filled 2014-12-09 (×4): qty 1

## 2014-12-09 MED ORDER — CETYLPYRIDINIUM CHLORIDE 0.05 % MT LIQD
7.0000 mL | Freq: Two times a day (BID) | OROMUCOSAL | Status: DC
  Administered 2014-12-09 – 2014-12-13 (×8): 7 mL via OROMUCOSAL

## 2014-12-09 MED ORDER — IOHEXOL 300 MG/ML  SOLN
25.0000 mL | Freq: Once | INTRAMUSCULAR | Status: AC | PRN
  Administered 2014-12-09: 25 mL via ORAL

## 2014-12-09 MED ORDER — IOHEXOL 300 MG/ML  SOLN
100.0000 mL | Freq: Once | INTRAMUSCULAR | Status: AC | PRN
Start: 2014-12-09 — End: 2014-12-09
  Administered 2014-12-09: 100 mL via INTRAVENOUS

## 2014-12-09 MED ORDER — FUROSEMIDE 10 MG/ML IJ SOLN
40.0000 mg | Freq: Once | INTRAMUSCULAR | Status: AC
  Administered 2014-12-10: 40 mg via INTRAVENOUS
  Filled 2014-12-09: qty 4

## 2014-12-09 MED ORDER — METOPROLOL TARTRATE 12.5 MG HALF TABLET
12.5000 mg | ORAL_TABLET | Freq: Two times a day (BID) | ORAL | Status: DC
  Administered 2014-12-10 (×2): 12.5 mg via ORAL
  Filled 2014-12-09 (×3): qty 1

## 2014-12-09 MED ORDER — HEPARIN BOLUS VIA INFUSION
4000.0000 [IU] | Freq: Once | INTRAVENOUS | Status: AC
  Administered 2014-12-09: 4000 [IU] via INTRAVENOUS
  Filled 2014-12-09: qty 4000

## 2014-12-09 MED ORDER — HEPARIN (PORCINE) IN NACL 100-0.45 UNIT/ML-% IJ SOLN
2300.0000 [IU]/h | INTRAMUSCULAR | Status: DC
  Administered 2014-12-09: 1500 [IU]/h via INTRAVENOUS
  Administered 2014-12-10 (×2): 1900 [IU]/h via INTRAVENOUS
  Administered 2014-12-11 (×2): 2300 [IU]/h via INTRAVENOUS
  Filled 2014-12-09 (×7): qty 250

## 2014-12-09 NOTE — ED Notes (Signed)
Pt to department via EMS- pt reports chest pain and SOB that started this afternoon while he was working. Pt took 325 ASA, and was noted to be in SVT on EMS arrival. Pt given 6mg  and 12mg  Adenosine. 4mg  Zofran. Pt with 20g left hand and 18g RAC. Given 48ml NS. Bp-108/82 225-cbg Hr-95.

## 2014-12-09 NOTE — Plan of Care (Signed)
Problem: Phase I Progression Outcomes Goal: Aspirin unless contraindicated Outcome: Completed/Met Date Met:  12/09/14 ASA was given by EMS

## 2014-12-09 NOTE — Progress Notes (Signed)
ANTICOAGULATION CONSULT NOTE - Initial Consult  Pharmacy Consult for Heparin Indication: chest pain/ACS  No Known Allergies  Patient Measurements: Weight: (!) 345 lb (156.491 kg)  Ht: 5'9" (RN estimate - pt is sleepy) IBW ~ 70.7 kg Heparin Dosing Weight: 108.8 kg  Vital Signs: Temp: 98.5 F (36.9 C) (04/09 1807) Temp Source: Oral (04/09 1807) BP: 97/58 mmHg (04/09 1845) Pulse Rate: 92 (04/09 1845)  Labs:  Recent Labs  12/09/14 1810  HGB 15.9  HCT 45.8  PLT 179    CrCl cannot be calculated (Unknown ideal weight.).   Medical History: Past Medical History  Diagnosis Date  . Hypertension   . GERD (gastroesophageal reflux disease)    Assessment: 57 YOM who presented on 4/9 with CP and SOB - originally called a code STEMI however cancelled upon arrival to the [facility] consulted to start Heparin for r/o ACS. Baseline CBC wnl, the patient was not on any blood thinners PTA besides ASA. Estimated Heparin Wt: 108.8 kg  Goal of Therapy:  Heparin level 0.3-0.7 units/ml Monitor platelets by anticoagulation protocol: Yes   Plan:  1. Heparin 4000 unit bolus x 1 dose 2. Start Heparin at a drip rate of 1500 units/hr (15 ml/hr) 3. Daily HL, CBC 4. Will continue to monitor for any signs/symptoms of bleeding and will follow up with heparin level in 6 hours   Alycia Rossetti, PharmD, BCPS Clinical Pharmacist Pager: [phone] 12/09/2014 7:09 PM

## 2014-12-09 NOTE — Progress Notes (Signed)
Chaplain responded to code stemi. No family present at this time. Page chaplain should family arrive and need support. [phone]   12/09/14 1800  Clinical Encounter Type  Visited With Health care provider  Visit Type Initial;Spiritual support  Marcelino Scot 12/09/2014 6:05 PM

## 2014-12-09 NOTE — H&P (Addendum)
Oscar [name] is an 39 y.o. male.    Chief Complaint: chest pain Primary Cardiologist: new HPI: Oscar [name] is a 39 yo man with PMH of hypertension on lisinopril and furosemide, pancreatitis and prior episode of SVT per history at [facility] with similar presentation and cardiac catheterization per history revealing borderline obstructive disease without PCI/intervention and he was supposed to follow-up but did not who presents with chest pain and shortness of breath. In the ambulance he was noted to be in SVT and code STEMI activated given ST elevation per their read. In the [facility] ER, after he had received 6 mg and 12 mg of adenosine and converted to sinus rhythm, pain much improved and ST elevation noted but in setting of significant LVH so code STEMI cancelled. Heparin gtt started for presumed ACS and troponin 0.13 resulted. He also received aspirin. He tells me he is now pain free after low dose NTG gtt started. He's noticed some swelling in his abdomen and legs at home over the last few weeks. He is compliant with his medications. 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 He continues to smoke ~ 1/2 ppd. He snores at night and has been told he stops breathing.    Past Medical History  Diagnosis Date  . Hypertension   . GERD (gastroesophageal reflux disease)     History reviewed. No pertinent past surgical history. No known family history of CAD History reviewed. No pertinent family history. Social History:  reports that he has been smoking.  He does not have any smokeless tobacco history on file. He reports that he does not drink alcohol or use illicit drugs.  Allergies: No Known Allergies   (Not in a hospital admission)  Results for orders placed or performed during the hospital encounter of 12/09/14 (from the past 48 hour(s))  CBC with Differential     Status: None   Collection Time: 12/09/14  6:10 PM  Result Value Ref Range   WBC 5.2 4.0 - 10.5 K/uL   RBC 4.92 4.22  - 5.81 MIL/uL   Hemoglobin 15.9 13.0 - 17.0 g/dL   HCT 45.8 39.0 - 52.0 %   MCV 93.1 78.0 - 100.0 fL   MCH 32.3 26.0 - 34.0 pg   MCHC 34.7 30.0 - 36.0 g/dL   RDW 12.7 11.5 - 15.5 %   Platelets 179 150 - 400 K/uL   Neutrophils Relative % 61 43 - 77 %   Neutro Abs 3.2 1.7 - 7.7 K/uL   Lymphocytes Relative 31 12 - 46 %   Lymphs Abs 1.6 0.7 - 4.0 K/uL   Monocytes Relative 6 3 - 12 %   Monocytes Absolute 0.3 0.1 - 1.0 K/uL   Eosinophils Relative 2 0 - 5 %   Eosinophils Absolute 0.1 0.0 - 0.7 K/uL   Basophils Relative 0 0 - 1 %   Basophils Absolute 0.0 0.0 - 0.1 K/uL  Comprehensive metabolic panel     Status: Abnormal   Collection Time: 12/09/14  6:10 PM  Result Value Ref Range   Sodium 138 135 - 145 mmol/L   Potassium 3.6 3.5 - 5.1 mmol/L   Chloride 101 96 - 112 mmol/L   CO2 24 19 - 32 mmol/L   Glucose, Bld 199 (H) 70 - 99 mg/dL   BUN 20 6 - 23 mg/dL   Creatinine, Ser 1.48 (H) 0.50 - 1.35 mg/dL   Calcium 8.8 8.4 - 10.5 mg/dL   Total Protein 7.2  6.0 - 8.3 g/dL   Albumin 3.4 (L) 3.5 - 5.2 g/dL   AST 49 (H) 0 - 37 U/L   ALT 45 0 - 53 U/L   Alkaline Phosphatase 139 (H) 39 - 117 U/L   Total Bilirubin 0.7 0.3 - 1.2 mg/dL   GFR calc non Af Amer 58 (L) >90 mL/min   GFR calc Af Amer 67 (L) >90 mL/min    Comment: (NOTE) The eGFR has been calculated using the CKD EPI equation. This calculation has not been validated in all clinical situations. eGFR's persistently <90 mL/min signify possible Chronic Kidney Disease.    Anion gap 13 5 - 15  Lipase, blood     Status: None   Collection Time: 12/09/14  6:10 PM  Result Value Ref Range   Lipase 36 11 - 59 U/L  Brain natriuretic peptide     Status: None   Collection Time: 12/09/14  6:10 PM  Result Value Ref Range   B Natriuretic Peptide 69.1 0.0 - 100.0 pg/mL  I-stat troponin, ED     Status: Abnormal   Collection Time: 12/09/14  6:31 PM  Result Value Ref Range   Troponin i, poc 0.13 (HH) 0.00 - 0.08 ng/mL   Comment NOTIFIED PHYSICIAN      Comment 3            Comment: Due to the release kinetics of cTnI, a negative result within the first hours of the onset of symptoms does not rule out myocardial infarction with certainty. If myocardial infarction is still suspected, repeat the test at appropriate intervals.   I-Stat CG4 Lactic Acid, ED     Status: None   Collection Time: 12/09/14  6:33 PM  Result Value Ref Range   Lactic Acid, Venous 1.61 0.5 - 2.0 mmol/L   Dg Chest Port 1 View  12/09/2014   CLINICAL DATA:  Chest pain, shortness of breath, dizziness  EXAM: PORTABLE CHEST - 1 VIEW  COMPARISON:  08/10/2014  FINDINGS: Significant cardiomegaly again noted. No acute infiltrate or pleural effusion. No pulmonary edema. Bony thorax is unremarkable.  IMPRESSION: No active disease.  Significant cardiomegaly.   Electronically Signed   By: Lahoma Crocker M.D.   On: 12/09/2014 18:37    Review of Systems  Constitutional: Positive for malaise/fatigue. Negative for fever and chills.  HENT: Negative for ear pain.   Eyes: Negative for double vision and photophobia.  Respiratory: Positive for cough and shortness of breath.   Cardiovascular: Positive for chest pain, palpitations and leg swelling.  Gastrointestinal: Negative for diarrhea, blood in stool and melena.  Genitourinary: Negative for dysuria and urgency.  Musculoskeletal: Negative for myalgias and neck pain.  Skin: Negative for rash.  Neurological: Negative for dizziness and headaches.  Endo/Heme/Allergies: Negative for polydipsia. Does not bruise/bleed easily.  Psychiatric/Behavioral: Negative for suicidal ideas and hallucinations. The patient is not nervous/anxious.     Blood pressure 92/65, pulse 85, temperature 98.5 F (36.9 C), temperature source Oral, resp. rate 18, weight 156.491 kg (345 lb), SpO2 94 %. Physical Exam  Nursing note and vitals reviewed. Constitutional: He is oriented to person, place, and time. He appears well-developed and well-nourished. No distress.   Obese man  HENT:  Head: Normocephalic and atraumatic.  Nose: Nose normal.  Mouth/Throat: Oropharynx is clear and moist. No oropharyngeal exudate.  Large neck  Eyes: Conjunctivae and EOM are normal. Pupils are equal, round, and reactive to light. No scleral icterus.  Neck: Normal range of motion. Neck supple. JVD  present. No tracheal deviation present.  JVP appeared elevated midneck but challenging to fully assess given large neck  Cardiovascular: Normal rate, regular rhythm, normal heart sounds and intact distal pulses.  Exam reveals no gallop.   No murmur heard. Respiratory: Effort normal and breath sounds normal. No respiratory distress. He has no wheezes. He has no rales.  GI: Soft. Bowel sounds are normal. He exhibits no distension. There is no tenderness.  + umbilical hernia  Musculoskeletal: Normal range of motion. He exhibits edema.  Trace to 1+ edema bilaterally  Neurological: He is alert and oriented to person, place, and time. No cranial nerve deficit. Coordination normal.  Skin: Skin is warm and dry. No rash noted. He is not diaphoretic. No erythema.  Psychiatric: He has a normal mood and affect. His behavior is normal. Thought content normal.    Labs reviewed; trop 0.13, creatinine 1.49, K 3.6, na 138, ast/alt 49/45 ECG: HR 93, LBBB with significant voltage (difficult to interpret in setting of LBBB), sinus rhythm  Assessment/Plan  39 yo man with prior history of SVT, hypertension, pancreatitis, ? Obstructive CAD per patient self report who presents with chest pain, shortness of breath and SVT that converted in the ambulance.  Problem List Chest Pain SVT NSTEMI with elevated troponin Hypertension ? 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 Stops breathing at night Tobacco abuse Plan: Differential diagnosis is musculoskeletal pain, esophageal spasm, GERD, aortic dissection, pericarditis, heart  failure, ACS/NSTEMI among other etiologies. I do not feel there is clear etiology as yet. He does have some known underlying CAD. However, he also has some manifestations of heart failure. Will trend cardiac biomarkers, ok to continue on heparin gtt and obtain echocardiogram in AM. Attempt to find old records. He also probably warrants LHC. He has a LBBB and elevated troponin with presumed history of CAD per patient report on prior cath. For now, aspirin, heparin, ntg gtt, morphine. If pain recurs, low threshold to activate cath lab.   - trend cardiac markers - observation on telemetry, admit to stepdown - plan for LHC; if symptoms change, low threshold to activate cath lab - IV lasix 40 mg x1 - asa 81 mg, heparin gtt - atorvastatin 80 mg qHS first dose now - hba1c, tsh, lipid panel, BNP - echocardiogram in AM - he will need sleep apnea evaluation as outpatient - daily smoking cessation counseling  Leigh Kaeding 12/09/2014, 7:45 PM

## 2014-12-09 NOTE — ED Notes (Signed)
Cardiology at [facility]

## 2014-12-09 NOTE — ED Notes (Signed)
CT informed that patient is done with contrast

## 2014-12-10 ENCOUNTER — Encounter (HOSPITAL_COMMUNITY): Payer: Self-pay | Admitting: *Deleted

## 2014-12-10 DIAGNOSIS — I471 Supraventricular tachycardia: Secondary | ICD-10-CM

## 2014-12-10 DIAGNOSIS — R079 Chest pain, unspecified: Secondary | ICD-10-CM

## 2014-12-10 LAB — HEPARIN LEVEL (UNFRACTIONATED)
Heparin Unfractionated: 0.19 IU/mL — ABNORMAL LOW (ref 0.30–0.70)
Heparin Unfractionated: 0.36 [IU]/mL (ref 0.30–0.70)
Heparin Unfractionated: 0.14 IU/mL — ABNORMAL LOW (ref 0.30–0.70)

## 2014-12-10 LAB — COMPREHENSIVE METABOLIC PANEL WITH GFR
ALT: 41 U/L (ref 0–53)
AST: 39 U/L — ABNORMAL HIGH (ref 0–37)
Albumin: 3 g/dL — ABNORMAL LOW (ref 3.5–5.2)
Alkaline Phosphatase: 132 U/L — ABNORMAL HIGH (ref 39–117)
Anion gap: 7 (ref 5–15)
BUN: 20 mg/dL (ref 6–23)
CO2: 31 mmol/L (ref 19–32)
Calcium: 8.1 mg/dL — ABNORMAL LOW (ref 8.4–10.5)
Chloride: 99 mmol/L (ref 96–112)
Creatinine, Ser: 1.2 mg/dL (ref 0.50–1.35)
GFR calc Af Amer: 87 mL/min — ABNORMAL LOW
GFR calc non Af Amer: 75 mL/min — ABNORMAL LOW
Glucose, Bld: 140 mg/dL — ABNORMAL HIGH (ref 70–99)
Potassium: 4.1 mmol/L (ref 3.5–5.1)
Sodium: 137 mmol/L (ref 135–145)
Total Bilirubin: 0.6 mg/dL (ref 0.3–1.2)
Total Protein: 7.1 g/dL (ref 6.0–8.3)

## 2014-12-10 LAB — BASIC METABOLIC PANEL WITH GFR
Anion gap: 13 (ref 5–15)
BUN: 21 mg/dL (ref 6–23)
CO2: 26 mmol/L (ref 19–32)
Calcium: 8.2 mg/dL — ABNORMAL LOW (ref 8.4–10.5)
Chloride: 98 mmol/L (ref 96–112)
Creatinine, Ser: 1.21 mg/dL (ref 0.50–1.35)
GFR calc Af Amer: 86 mL/min — ABNORMAL LOW
GFR calc non Af Amer: 74 mL/min — ABNORMAL LOW
Glucose, Bld: 192 mg/dL — ABNORMAL HIGH (ref 70–99)
Potassium: 4 mmol/L (ref 3.5–5.1)
Sodium: 137 mmol/L (ref 135–145)

## 2014-12-10 LAB — CBC
HCT: 43.1 % (ref 39.0–52.0)
HEMATOCRIT: 42.2 % (ref 39.0–52.0)
HEMOGLOBIN: 14.4 g/dL (ref 13.0–17.0)
Hemoglobin: 14.6 g/dL (ref 13.0–17.0)
MCH: 32.4 pg (ref 26.0–34.0)
MCH: 32.7 pg (ref 26.0–34.0)
MCHC: 33.9 g/dL (ref 30.0–36.0)
MCHC: 34.1 g/dL (ref 30.0–36.0)
MCV: 95.8 fL (ref 78.0–100.0)
MCV: 95.7 fL (ref 78.0–100.0)
PLATELETS: 165 10*3/uL (ref 150–400)
Platelets: 128 10*3/uL — ABNORMAL LOW (ref 150–400)
RBC: 4.5 MIL/uL (ref 4.22–5.81)
RBC: 4.41 MIL/uL (ref 4.22–5.81)
RDW: 13.3 % (ref 11.5–15.5)
RDW: 12.9 % (ref 11.5–15.5)
WBC: 5.6 10*3/uL (ref 4.0–10.5)
WBC: 7.2 10*3/uL (ref 4.0–10.5)

## 2014-12-10 LAB — MAGNESIUM: MAGNESIUM: 1.6 mg/dL (ref 1.5–2.5)

## 2014-12-10 LAB — TROPONIN I
TROPONIN I: 0.68 ng/mL — AB (ref <0.031)
Troponin I: 0.63 ng/mL (ref <0.031)
Troponin I: 0.44 ng/mL — ABNORMAL HIGH (ref <0.031)

## 2014-12-10 LAB — LIPID PANEL
Cholesterol: 236 mg/dL — ABNORMAL HIGH (ref 0–200)
HDL: 28 mg/dL — ABNORMAL LOW (ref >39)
LDL Cholesterol: 169 mg/dL — ABNORMAL HIGH (ref 0–99)
Total CHOL/HDL Ratio: 8.4 RATIO
Triglycerides: 195 mg/dL — ABNORMAL HIGH (ref <150)
VLDL: 39 mg/dL (ref 0–40)

## 2014-12-10 LAB — PROTIME-INR
INR: 1.05 (ref 0.00–1.49)
PROTHROMBIN TIME: 13.8 s (ref 11.6–15.2)

## 2014-12-10 LAB — BRAIN NATRIURETIC PEPTIDE: B NATRIURETIC PEPTIDE 5: 60.8 pg/mL (ref 0.0–100.0)

## 2014-12-10 LAB — MRSA PCR SCREENING: MRSA by PCR: NEGATIVE

## 2014-12-10 LAB — TSH: TSH: 0.648 u[IU]/mL (ref 0.350–4.500)

## 2014-12-10 MED ORDER — SODIUM CHLORIDE 0.9 % IJ SOLN: 3.0000 mL | INTRAMUSCULAR | Status: DC | PRN

## 2014-12-10 MED ORDER — HEPARIN BOLUS VIA INFUSION
3000.0000 [IU] | Freq: Once | INTRAVENOUS | Status: AC
  Administered 2014-12-10: 3000 [IU] via INTRAVENOUS
  Filled 2014-12-10: qty 3000

## 2014-12-10 MED ORDER — SODIUM CHLORIDE 0.9 % IJ SOLN
3.0000 mL | Freq: Two times a day (BID) | INTRAMUSCULAR | Status: DC
  Administered 2014-12-10: 3 mL via INTRAVENOUS
  Administered 2014-12-11: 11:00:00 via INTRAVENOUS

## 2014-12-10 MED ORDER — POTASSIUM CHLORIDE CRYS ER 20 MEQ PO TBCR
40.0000 meq | EXTENDED_RELEASE_TABLET | Freq: Once | ORAL | Status: AC
  Administered 2014-12-10: 40 meq via ORAL
  Filled 2014-12-10: qty 2

## 2014-12-10 MED ORDER — SODIUM CHLORIDE 0.9 % IV SOLN: 250.0000 mL | INTRAVENOUS | Status: DC | PRN

## 2014-12-10 MED ORDER — CARVEDILOL 6.25 MG PO TABS
6.2500 mg | ORAL_TABLET | Freq: Two times a day (BID) | ORAL | Status: DC
  Administered 2014-12-10 – 2014-12-13 (×7): 6.25 mg via ORAL
  Filled 2014-12-10 (×9): qty 1

## 2014-12-10 MED ORDER — CARVEDILOL 6.25 MG PO TABS: 6.2500 mg | ORAL_TABLET | Freq: Two times a day (BID) | ORAL | Status: DC

## 2014-12-10 MED ORDER — ASPIRIN 81 MG PO CHEW
81.0000 mg | CHEWABLE_TABLET | ORAL | Status: AC
Start: 2014-12-11 — End: 2014-12-11
  Administered 2014-12-11: 81 mg via ORAL
  Filled 2014-12-10: qty 1

## 2014-12-10 MED ORDER — FUROSEMIDE 10 MG/ML IJ SOLN
40.0000 mg | Freq: Once | INTRAMUSCULAR | Status: AC
  Administered 2014-12-10: 40 mg via INTRAVENOUS
  Filled 2014-12-10: qty 4

## 2014-12-10 NOTE — Progress Notes (Signed)
ANTICOAGULATION CONSULT NOTE - Follow Up Consult  Pharmacy Consult for heparin Indication: NSTEMI   Labs:  Recent Labs  12/09/14 1810 12/10/14 0007 12/10/14 0352  HGB 15.9  --  14.6  HCT 45.8  --  43.1  PLT 179  --  PENDING  HEPARINUNFRC  --   --  0.19*  CREATININE 1.48* 1.20  --   TROPONINI  --  0.68*  --      Assessment: 38yo male subtherapeutic on heparin with initial dosing for NSTEMI.  Goal of Therapy:  Heparin level 0.3-0.7 units/ml   Plan:  Will rebolus with heparin 3000 units x1 and increase gtt by 3 units/kg/hr to 1900 units/hr and check level in 6hr.  Wynona Neat, PharmD, BCPS  12/10/2014,4:53 AM

## 2014-12-10 NOTE — Progress Notes (Signed)
ANTICOAGULATION CONSULT NOTE - Follow Up Consult  Pharmacy Consult for heparin Indication: NSTEMI  No Known Allergies  Patient Measurements: Height: 5\' 4"  (162.6 cm) Weight: (!) 340 lb 9.8 oz (154.5 kg) IBW/kg (Calculated) : 59.2 Heparin Dosing Weight: 109 kg  Vital Signs: Temp: 97.5 F (36.4 C) (04/10 1642) Temp Source: Oral (04/10 1642) BP: 104/72 mmHg (04/10 1642) Pulse Rate: 78 (04/10 1642)  Labs:  Recent Labs  12/09/14 1810 12/10/14 0007 12/10/14 0352 12/10/14 1310 12/10/14 1631  HGB 15.9  --  14.6  --   --   HCT 45.8  --  43.1  --   --   PLT 179  --  128*  --   --   HEPARINUNFRC  --   --  0.19* >2.20* 0.36  CREATININE 1.48* 1.20 1.21  --   --   TROPONINI  --  0.68* 0.63* 0.44*  --     Estimated Creatinine Clearance: 112.8 mL/min (by C-G formula based on Cr of 1.21).   Medications:  Infusions:  . heparin 1,900 Units (12/10/14 1400)  . nitroGLYCERIN 45 mcg/min (12/10/14 1400)    Assessment: 39 y/o male on IV heparin for NSTEMI awaiting cath tomorrow. Heparin level is therapeutic at 0.36 on 1900 units/hr. No bleeding noted, Hb is stable, platelets have dropped to 128 (179 on admit).  Goal of Therapy:  Heparin level 0.3-0.7 units/ml Monitor platelets by anticoagulation protocol: Yes   Plan:  - Continue heparin drip at 1900 units/hr - 6 hr confirmatory heparin level - Will also check CBC with drop in platelets  [facility] [place].D., BCPS Clinical Pharmacist Pager: [phone] 12/10/2014 5:07 PM

## 2014-12-10 NOTE — Consult Note (Signed)
PULMONARY  / CRITICAL CARE MEDICINE  Name: Oscar [name] MRN: [id number] DOB: [birth year]    LOS: 2  REFERRING MD :  Cardiology  CHIEF COMPLAINT: Desaturation while asleep/OSA  BRIEF PATIENT DESCRIPTION: Oscar [name] is a 39 yo man with PMHx of HTN, suspected OSA, CHF, CKD, borderline obstructive CAD, pancreatitis and history of SVT who presented in SVT and found to have elevated troponins. Cardiology is managing patient for demand ischemia due to SVT versus ACS with plaque rupture. Patient will go for cath tomorrow. PCCM consulted for management of desaturation while asleep on nasal cannula.   LINES / TUBES: PIV  CULTURES: None  ANTIBIOTICS: None  LEVEL OF CARE:  Step-down PRIMARY SERVICE:  Cardiology CONSULTANTS: PCCM CODE STATUS: FULL DIET:  NPO at midnight for cath DVT Px: Heparin gtt GI Px: Protonix 40 mg QD  HISTORY OF PRESENT ILLNESS:  Oscar [name] is a 39 yo man with PMHx of HTN, suspected OSA, CHF, CKD, borderline obstructive CAD, pancreatitis and history of SVT who presented in SVT and found to have elevated troponins. In the ED, patient was given adenosine with conversion to sinus rhythm. Heparin gtt and nitro gtt was started and ASA given for possible ACS/NSTEMI with elevated troponin 0.68>0.63>0.44. Chest pain resolved and patient remained in sinus rhythm. Cardiology is managing patient for demand ischemia due to SVT versus ACS with plaque rupture. Patient will go for cath tomorrow. Patient has been satting in the low-mid 90s while on nasal cannula 3-4 L during the day. Patient was desaturating from this to 79% while sleeping on 5 L via [place]. Patient also noted to have periods of apnea lasting for 25 seconds. PCCM was consulted for management. Patient was sleeping comfortably but arousable while on BiPAP during visit.    PAST MEDICAL HISTORY :  Past Medical History  Diagnosis Date  . Hypertension   . GERD (gastroesophageal reflux disease)    Past Surgical History   Procedure Laterality Date  . Left heart catheterization with coronary angiogram N/A 12/09/2014    Procedure: LEFT HEART CATHETERIZATION WITH CORONARY ANGIOGRAM;  Surgeon: Burnell Blanks, MD;  Location: [facility] CATH LAB;  Service: Cardiovascular;  Laterality: N/A;   Prior to Admission medications   Medication Sig Start Date End Date Taking? Authorizing Provider  aspirin EC 325 MG tablet Take 325 mg by mouth daily.   Yes Historical Provider, MD  furosemide (LASIX) 20 MG tablet Take 20 mg by mouth daily.   Yes Historical Provider, MD  lisinopril (PRINIVIL,ZESTRIL) 20 MG tablet Take 20 mg by mouth daily.   Yes Historical Provider, MD  omeprazole (PRILOSEC) 20 MG capsule Take 20 mg by mouth daily.   Yes Historical Provider, MD  potassium chloride SA (K-DUR,KLOR-CON) 20 MEQ tablet Take 20 mEq by mouth daily.   Yes Historical Provider, MD   No Known Allergies  FAMILY HISTORY:  History reviewed. No pertinent family history.   SOCIAL HISTORY:  reports that he has been smoking Cigarettes.  He has been smoking about 0.50 packs per day. He does not have any smokeless tobacco history on file. He reports that he does not drink alcohol or use illicit drugs.  REVIEW OF SYSTEMS:   General: Denies fever, chills, fatigue Respiratory: Denies SOB, cough, chest tightness, and wheezing.   Cardiovascular: Denies chest pain and palpitations.  Gastrointestinal: Denies nausea, vomiting, abdominal pain  VITAL SIGNS: Temp:  [97.5 F (36.4 C)-98.8 F (37.1 C)] 97.6 F (36.4 C) (04/10 2300) Pulse Rate:  [51-85] 78 (04/11  0012) Resp:  [14-20] 19 (04/11 0012) BP: (99-152)/(56-92) 106/63 mmHg (04/11 0012) SpO2:  [90 %-99 %] 97 % (04/11 0012) FiO2 (%):  [40 %] 40 % (04/11 0012) Weight:  [340 lb 9.8 oz (154.5 kg)] 340 lb 9.8 oz (154.5 kg) (04/10 0426)   Vent Mode:  [-]  FiO2 (%):  [40 %] 40 % INTAKE / OUTPUT: Intake/Output      04/10 0701 - 04/11 0700   P.O. 1180   I.V. (mL/kg) 555.2 (3.6)   Total  Intake(mL/kg) 1735.2 (11.2)   Urine (mL/kg/hr) 2100 (0.6)   Total Output 2100   Net -364.9        PHYSICAL EXAMINATION: General: Vital signs reviewed.  Patient is an obese male, sleeping comfortably, in no acute distress and cooperative with exam.  Neck: Morbidly obese neck, unable to effectively assess for JVD Cardiovascular: RRR Pulmonary/Chest: Clear to auscultation in anterior fields, difficult to auscultate posterior fields given obesity, no wheezes, rales, or rhonchi. Abdominal: Soft, obese, non-tender, non-distended, BS + Extremities: Trace pitting edema bilaterally Skin: Warm, dry and intact. No rashes or erythema.  LABS: Cbc  Recent Labs Lab 12/09/14 1810 12/10/14 0352 12/10/14 2153  WBC 5.2 5.6 7.2  HGB 15.9 14.6 14.4  HCT 45.8 43.1 42.2  PLT 179 128* 165    Chemistry   Recent Labs Lab 12/09/14 1810 12/10/14 0007 12/10/14 0352  NA 138 137 137  K 3.6 4.1 4.0  CL 101 99 98  CO2 24 31 26   BUN 20 20 21   CREATININE 1.48* 1.20 1.21  CALCIUM 8.8 8.1* 8.2*  MG  --  1.6  --   GLUCOSE 199* 140* 192*    Liver fxn  Recent Labs Lab 12/09/14 1810 12/10/14 0007  AST 49* 39*  ALT 45 41  ALKPHOS 139* 132*  BILITOT 0.7 0.6  PROT 7.2 7.1  ALBUMIN 3.4* 3.0*   coags  Recent Labs Lab 12/10/14 1631  INR 1.05   Sepsis markers  Recent Labs Lab 12/09/14 1833 12/09/14 2126  LATICACIDVEN 1.61 0.86   Cardiac markers  Recent Labs Lab 12/10/14 0007 12/10/14 0352 12/10/14 1310  TROPONINI 0.68* 0.63* 0.44*   IMAGING: CT abdomen/pelvix 12/09/14>>Stable fat containing periumbilical hernia. Stable retroperitoneal and inguinal adenopathy compared to prior exam most likely reactive or benign. No other significant abnormality seen in the abdomen or pelvis. CXR 12/09/2014>> No active disease.Significant cardiomegaly.  DIAGNOSES: Active Problems:   SVT (supraventricular tachycardia)   NSTEMI (non-ST elevated myocardial infarction)    Hypertension   ASSESSMENT / PLAN:  PULMONARY  ASSESSMENT: Pt with mild desaturation down to 91% on 5L O2 via nasal canula. No prior sleep study in the system and pt is not on CPAP at home. Pt is morbidly obese and is intermittently apneic while asleep on exam. Tobacco abuse 1/2 ppd PLAN:  Likely with OSA vs OHS given body habitus Bipap overnight Will need sleep study as an outpatient Smoking cessation  CARDIOVASCULAR  ASSESSMENT:  Chest pain on admission SVT w/ demand ischemia vs ACS Acute CHF exacerbation H/o CAD  HTN PLAN:  Pt admitted to Cardiology service Plans for heart catheterization 4/11 SVT control with medical management per primary team Diuresis and BP control per primary team  RENAL  ASSESSMENT: CKD 2 vs AKI. Cr improved from admission.  PLAN:  ACEI being held prior to heart catheterization BMP in am  CLINICAL SUMMARY: Oscar [name] is a 39 yo man with PMHx of HTN, suspected OSA, CHF, CKD, borderline obstructive CAD, pancreatitis  and history of SVT who presented in SVT and found to have elevated troponins. Cardiology is managing patient for demand ischemia due to SVT versus ACS with plaque rupture. Patient will go for cath tomorrow. Patient was desaturating from this to 79% while sleeping on 5 L via [place] with apneic episodes. PCCM was consulted for management. Patient satting well on BiPAP.   Osa Craver, DO PGY-1 Internal Medicine Resident Pager # [phone] 12/11/2014 12:21 AM   Attending:  I have seen and examined the patient with nurse practitioner/resident and agree with the note above.   On my exam Oscar [name] is sleepy but comfortable, answering questions, some crackles in bases, legs with edema.    Impression/Plan 1) Obstructive sleep apnea> untreated due to financial issues, never had CPAP titration study.  I think he has resting hypercapnea so he may qualify for BIPAP based on that alone.  Clearly his OSA is contributing to the severity  of his hypertension and heart failure.   Will obtain ABG and place case management consult for BIPAP.  2) CHF exacerbation 3) NSTEMI  Roselie Awkward, MD [place] [facility] Pager: [phone] Cell: [phone] If no response, call [phone]

## 2014-12-10 NOTE — ED Provider Notes (Signed)
CSN: [id number]     Arrival date & time 12/09/14  1801 History   First MD Initiated Contact with Patient 12/09/14 1813     Chief Complaint  Patient presents with  . Chest Pain     (Consider location/radiation/quality/duration/timing/severity/associated sxs/prior Treatment) HPI  This is a 39 yo male with PMH HTN, GERD, ACS about a year ago, presenting today with CP as a code STEMI.  Pain started two years ago.  Located substernally.  Persistent, but has improved after converting from SVT to sinus rhythm.  Described as pressure-like.  Non-radiating.  Initially associated with nausea, diaphoresis.   Past Medical History  Diagnosis Date  . Hypertension   . GERD (gastroesophageal reflux disease)    Past Surgical History  Procedure Laterality Date  . Left heart catheterization with coronary angiogram N/A 12/09/2014    Procedure: LEFT HEART CATHETERIZATION WITH CORONARY ANGIOGRAM;  Surgeon: Burnell Blanks, MD;  Location: [facility] CATH LAB;  Service: Cardiovascular;  Laterality: N/A;   History reviewed. No pertinent family history. History  Substance Use Topics  . Smoking status: Current Every Day Smoker  . Smokeless tobacco: Not on file  . Alcohol Use: No    Review of Systems  Constitutional: Negative for fever and chills.  HENT: Negative for facial swelling.   Eyes: Negative for pain and visual disturbance.  Respiratory: Positive for chest tightness and shortness of breath.   Cardiovascular: Positive for chest pain.  Gastrointestinal: Positive for nausea. Negative for vomiting.  Genitourinary: Negative for dysuria.  Musculoskeletal: Negative for myalgias and arthralgias.  Neurological: Negative for headaches.  Psychiatric/Behavioral: Negative for behavioral problems.      Allergies  Review of patient's allergies indicates no known allergies.  Home Medications   Prior to Admission medications   Medication Sig Start Date End Date Taking? Authorizing Provider  aspirin EC  325 MG tablet Take 325 mg by mouth daily.   Yes Historical Provider, MD  furosemide (LASIX) 20 MG tablet Take 20 mg by mouth daily.   Yes Historical Provider, MD  lisinopril (PRINIVIL,ZESTRIL) 20 MG tablet Take 20 mg by mouth daily.   Yes Historical Provider, MD  omeprazole (PRILOSEC) 20 MG capsule Take 20 mg by mouth daily.   Yes Historical Provider, MD  potassium chloride SA (K-DUR,KLOR-CON) 20 MEQ tablet Take 20 mEq by mouth daily.   Yes Historical Provider, MD   BP 118/69 mmHg  Pulse 65  Temp(Src) 97.4 F (36.3 C) (Oral)  Resp 18  Ht 5\' 4"  (1.626 m)  Wt 340 lb 6.2 oz (154.4 kg)  BMI 58.40 kg/m2  SpO2 93% Physical Exam  Constitutional: He is oriented to person, place, and time. He appears well-developed and well-nourished. No distress.  HENT:  Head: Normocephalic and atraumatic.  Mouth/Throat: No oropharyngeal exudate.  Eyes: Conjunctivae are normal. Pupils are equal, round, and reactive to light. No scleral icterus.  Neck: Normal range of motion. No tracheal deviation present. No thyromegaly present.  Cardiovascular: Regular rhythm and normal heart sounds.  Exam reveals no gallop and no friction rub.   No murmur heard. Sinus tachycardia  Pulmonary/Chest: Effort normal and breath sounds normal. No stridor. No respiratory distress. He has no wheezes. He has no rales. He exhibits no tenderness.  Abdominal: Soft. He exhibits no distension and no mass. There is no tenderness. There is no rebound and no guarding.  Musculoskeletal: Normal range of motion. He exhibits no edema.  Neurological: He is alert and oriented to person, place, and time.  Skin: Skin  is warm. He is diaphoretic (mild).    ED Course  Procedures (including critical care time) Labs Review Labs Reviewed  COMPREHENSIVE METABOLIC PANEL - Abnormal; Notable for the following:    Glucose, Bld 199 (*)    Creatinine, Ser 1.48 (*)    Albumin 3.4 (*)    AST 49 (*)    Alkaline Phosphatase 139 (*)    GFR calc non Af Amer  58 (*)    GFR calc Af Amer 67 (*)    All other components within normal limits  I-STAT TROPOININ, ED - Abnormal; Notable for the following:    Troponin i, poc 0.13 (*)    All other components within normal limits  MRSA PCR SCREENING  CBC WITH DIFFERENTIAL/PLATELET  LIPASE, BLOOD  BRAIN NATRIURETIC PEPTIDE  HEPARIN LEVEL (UNFRACTIONATED)  COMPREHENSIVE METABOLIC PANEL  MAGNESIUM  TSH  TROPONIN I  TROPONIN I  TROPONIN I  HEMOGLOBIN A1C  BRAIN NATRIURETIC PEPTIDE  BASIC METABOLIC PANEL  LIPID PANEL  CBC  I-STAT CG4 LACTIC ACID, ED  I-STAT CG4 LACTIC ACID, ED    Imaging Review Ct Abdomen Pelvis W Contrast  12/09/2014   CLINICAL DATA:  Upper abdominal pain.  EXAM: CT ABDOMEN AND PELVIS WITH CONTRAST  TECHNIQUE: Multidetector CT imaging of the abdomen and pelvis was performed using the standard protocol following bolus administration of intravenous contrast.  CONTRAST:  166mL OMNIPAQUE IOHEXOL 300 MG/ML  SOLN  COMPARISON:  CT scan of February 16, 2014.  FINDINGS: Visualized lung bases appear normal. No significant osseous abnormality is noted.  No gallstones are noted. The liver, spleen and pancreas appear normal. Adrenal glands and kidneys appear normal. No hydronephrosis or renal obstruction is noted. No renal or ureteral calculi are noted. Stable fat containing periumbilical hernia is noted. There is no evidence bowel obstruction. No abnormal fluid collection is noted. Stable retroperitoneal and bilateral inguinal adenopathy is noted compared to prior exam which is nonspecific and most likely reactive. Urinary bladder appears normal.  IMPRESSION: Stable fat containing periumbilical hernia.  Stable retroperitoneal and inguinal adenopathy compared to prior exam most likely reactive or benign.  No other significant abnormality seen in the abdomen or pelvis.   Electronically Signed   By: Marijo Conception, M.D.   On: 12/09/2014 20:34   Dg Chest Port 1 View  12/09/2014   CLINICAL DATA:  Chest pain,  shortness of breath, dizziness  EXAM: PORTABLE CHEST - 1 VIEW  COMPARISON:  08/10/2014  FINDINGS: Significant cardiomegaly again noted. No acute infiltrate or pleural effusion. No pulmonary edema. Bony thorax is unremarkable.  IMPRESSION: No active disease.  Significant cardiomegaly.   Electronically Signed   By: Lahoma Crocker M.D.   On: 12/09/2014 18:37     EKG Interpretation   Date/Time:  Saturday December 09 2014 18:15:29 EDT Ventricular Rate:  93 PR Interval:  161 QRS Duration: 126 QT Interval:  424 QTC Calculation: 527 R Axis:   -28 Text Interpretation:  Sinus rhythm Left atrial enlargement Left bundle  branch block No significant change since last tracing Confirmed by YAO   MD, DAVID ([id number]) on 12/09/2014 6:40:17 PM      MDM   Final diagnoses:  Chest pain  Abdominal pain    This is a 39 yo male with PMH HTN, GERD, ACS about a year ago, presenting today with CP as a code STEMI.  Pain started two years ago.  Located substernally.  Persistent, but has improved after converting from SVT to sinus rhythm.  Described  as pressure-like.  Non-radiating.  Initially associated with nausea, diaphoresis.   Exam reveals an uncomfortable gentleman, with mild diaphoresis.  He states that he feels much better since conversion from SVT.  Paramedics state they found the patient tachycardic to the 160s, in SVT.  6 mg IV adenosine given to no avail.  Pt converted after second dose, consisting of 12 mg.    Troponin is positive.  Heparin given.  Pt has improved.  I am concerned with history of ACS, presentation, J point elevation (although in the setting of LVH), and elevated troponin, pt is suffering from ACS.  I have spoken with cardiology.  They will admit to their service.  I have started nitroglycerin drip with good response.    Pt is being admitted in stable condition.    I have discussed case and care has been guided by my attending physician, Darl Householder.   Doy Hutching, MD 12/10/14 0025  Wandra Arthurs, MD 12/11/14 [phone]

## 2014-12-10 NOTE — Progress Notes (Signed)
Patient c/o feeling like he can not breath when sleeping. Patient observed having periods of apnea lasting about 25 sec with oxygen levels dropping to 79 with O2 @ 5L Oscar [name]. Dr. Wynonia Lawman notified and plans to get pulmonary involved. Will continue to monitor at this time.

## 2014-12-10 NOTE — Progress Notes (Signed)
ANTICOAGULATION CONSULT NOTE - Follow Up Consult  Pharmacy Consult for heparin Indication: NSTEMI  Labs:  Recent Labs  12/09/14 1810 12/10/14 0007  12/10/14 0352 12/10/14 1310 12/10/14 1631 12/10/14 2153  HGB 15.9  --   --  14.6  --   --  14.4  HCT 45.8  --   --  43.1  --   --  42.2  PLT 179  --   --  128*  --   --  165  LABPROT  --   --   --   --   --  13.8  --   INR  --   --   --   --   --  1.05  --   HEPARINUNFRC  --   --   < > 0.19* >2.20* 0.36 0.14*  CREATININE 1.48* 1.20  --  1.21  --   --   --   TROPONINI  --  0.68*  --  0.63* 0.44*  --   --   < > = values in this interval not displayed.   Assessment: 39yo male now subtherapeutic on heparin after one level at goal, no gtt issues per RN.  Plt back to baseline, earlier Plt ?lab error.  Goal of Therapy:  Heparin level 0.3-0.7 units/ml   Plan:  Will rebolus with heparin 3000 units and increase gtt by 3-4 units/kg/hr to 2300 units/hr and check level with am labs.  Wynona Neat, PharmD, BCPS  12/10/2014,10:48 PM

## 2014-12-10 NOTE — Progress Notes (Addendum)
Patient ID: Oscar [name], male   DOB: 1976-08-10, 39 y.o.   MRN: [id number]   SUBJECTIVE: No further chest pain, remains in NSR.  Reports a lot of sleepiness during the day.   Scheduled Meds: . antiseptic oral rinse  7 mL Mouth Rinse BID  . aspirin  324 mg Oral NOW   Or  . aspirin  300 mg Rectal NOW  . aspirin EC  81 mg Oral Daily  . atorvastatin  80 mg Oral q1800  . carvedilol  6.25 mg Oral BID WC  . furosemide  40 mg Intravenous Once  . metoprolol tartrate  12.5 mg Oral BID  . pantoprazole  40 mg Oral Daily   Continuous Infusions: . heparin 1,900 Units (12/10/14 0900)  . nitroGLYCERIN 45 mcg/min (12/10/14 0900)   PRN Meds:.acetaminophen, nitroGLYCERIN, ondansetron (ZOFRAN) IV    Filed Vitals:   12/10/14 0300 12/10/14 0355 12/10/14 0426 12/10/14 0808  BP: 107/57 129/78  152/92  Pulse: 65 69  80  Temp:  97.6 F (36.4 C)  98.8 F (37.1 C)  TempSrc:  Oral  Oral  Resp: 14 15  18   Height:      Weight:   340 lb 9.8 oz (154.5 kg)   SpO2: 90% 92%  92%    Intake/Output Summary (Last 24 hours) at 12/10/14 1027 Last data filed at 12/10/14 0900  Gross per 24 hour  Intake 1962.08 ml  Output   1300 ml  Net 662.08 ml    LABS: Basic Metabolic Panel:  Recent Labs  12/10/14 0007 12/10/14 0352  NA 137 137  K 4.1 4.0  CL 99 98  CO2 31 26  GLUCOSE 140* 192*  BUN 20 21  CREATININE 1.20 1.21  CALCIUM 8.1* 8.2*  MG 1.6  --    Liver Function Tests:  Recent Labs  12/09/14 1810 12/10/14 0007  AST 49* 39*  ALT 45 41  ALKPHOS 139* 132*  BILITOT 0.7 0.6  PROT 7.2 7.1  ALBUMIN 3.4* 3.0*    Recent Labs  12/09/14 1810  LIPASE 36   CBC:  Recent Labs  12/09/14 1810 12/10/14 0352  WBC 5.2 5.6  NEUTROABS 3.2  --   HGB 15.9 14.6  HCT 45.8 43.1  MCV 93.1 95.8  PLT 179 128*   Cardiac Enzymes:  Recent Labs  12/10/14 0007 12/10/14 0352  TROPONINI 0.68* 0.63*   BNP: Invalid input(s): POCBNP D-Dimer: No results for input(s): DDIMER in the last 72  hours. Hemoglobin A1C: No results for input(s): HGBA1C in the last 72 hours. Fasting Lipid Panel:  Recent Labs  12/10/14 0352  CHOL 236*  HDL 28*  LDLCALC 169*  TRIG 195*  CHOLHDL 8.4   Thyroid Function Tests:  Recent Labs  12/10/14 0007  TSH 0.648   Anemia Panel: No results for input(s): VITAMINB12, FOLATE, FERRITIN, TIBC, IRON, RETICCTPCT in the last 72 hours.  RADIOLOGY: Ct Abdomen Pelvis W Contrast  12/09/2014   CLINICAL DATA:  Upper abdominal pain.  EXAM: CT ABDOMEN AND PELVIS WITH CONTRAST  TECHNIQUE: Multidetector CT imaging of the abdomen and pelvis was performed using the standard protocol following bolus administration of intravenous contrast.  CONTRAST:  160mL OMNIPAQUE IOHEXOL 300 MG/ML  SOLN  COMPARISON:  CT scan of February 16, 2014.  FINDINGS: Visualized lung bases appear normal. No significant osseous abnormality is noted.  No gallstones are noted. The liver, spleen and pancreas appear normal. Adrenal glands and kidneys appear normal. No hydronephrosis or renal obstruction is noted. No  renal or ureteral calculi are noted. Stable fat containing periumbilical hernia is noted. There is no evidence bowel obstruction. No abnormal fluid collection is noted. Stable retroperitoneal and bilateral inguinal adenopathy is noted compared to prior exam which is nonspecific and most likely reactive. Urinary bladder appears normal.  IMPRESSION: Stable fat containing periumbilical hernia.  Stable retroperitoneal and inguinal adenopathy compared to prior exam most likely reactive or benign.  No other significant abnormality seen in the abdomen or pelvis.   Electronically Signed   By: Marijo Conception, M.D.   On: 12/09/2014 20:34   Dg Chest Port 1 View  12/09/2014   CLINICAL DATA:  Chest pain, shortness of breath, dizziness  EXAM: PORTABLE CHEST - 1 VIEW  COMPARISON:  08/10/2014  FINDINGS: Significant cardiomegaly again noted. No acute infiltrate or pleural effusion. No pulmonary edema. Bony  thorax is unremarkable.  IMPRESSION: No active disease.  Significant cardiomegaly.   Electronically Signed   By: Lahoma Crocker M.D.   On: 12/09/2014 18:37    PHYSICAL EXAM General: NAD Neck: Thick, JVP 10 cm, no thyromegaly or thyroid nodule.  Lungs: Clear to auscultation bilaterally with normal respiratory effort. CV: Nondisplaced PMI.  Heart regular S1/S2, no S3/S4, no murmur.  Trace ankle edema.   Abdomen: Soft, nontender, no hepatosplenomegaly, no distention.  Neurologic: Alert and oriented x 3.  Psych: Normal affect. Extremities: No clubbing or cyanosis.   TELEMETRY: Reviewed telemetry pt in NSR  ASSESSMENT AND PLAN: 38 yo with h/o SVT, "borderline obstructive CAD," HTN, smoking, suspected OSA was admitted with chest pain and SVT. Troponin elevated.  1. Elevated troponin: Reached 0.63.  Demand ischemia due to SVT versus true ACS with plaque rupture.  Says that the chest pain began when his heart started racing.  CP has resolved.  - Continue ASA, statin, heparin gtt.  - He is on NTG gtt, will continue.  - Add Coreg 6.25 mg bid.   - I would favor cardiac catheterization.  He had cath around 2 years ago at [facility], was told his CAD was "borderline" and was told to go to [facility] for something but he does not remember what.  He never followed up. May risk factors: HTN, hyperlipidemia, ongoing smoking.  Will arrange for tomorrow.  2. HTN: For now, manage with NTG gtt and Coreg.  Will hold lisinopril pre-cath given CKD.  Restart afterwards.  3. Acute CHF: Most likely diastolic. He has volume overload on exam with elevated JVP and dyspnea.  BNP not high but he is obese.  - Got Lasix this am, will give another 40 mg IV at noon then hold off until after cath tomorrow. - Echo 4. CKD: Stable creatinine.  As above, hold ACEI prior to cath.  5. SVT: Resolved in ER with adenosine. No strips or ECGs showing the SVT.  Has had in the past.  Not on beta blocker or CCB at home.  - Adding Coreg.  - Could consider  ablation, but would probably try medical management 1st especially as we have no 12 lead to document.  6. Suspect OSA, possible OHS/OSA given low oxygen saturation currently.  Will need workup of this.   Loralie Champagne 12/10/2014 10:37 AM

## 2014-12-10 NOTE — Progress Notes (Signed)
  Echocardiogram 2D Echocardiogram has been performed.  Oscar [name] 12/10/2014, 4:23 PM

## 2014-12-11 ENCOUNTER — Encounter (HOSPITAL_COMMUNITY)
Admission: EM | Disposition: A | Discharge status: Home / Self Care | Payer: Self-pay | Source: Other Acute Inpatient Hospital | Attending: Cardiology

## 2014-12-11 ENCOUNTER — Encounter (HOSPITAL_COMMUNITY): Payer: Self-pay | Admitting: Cardiovascular Disease

## 2014-12-11 DIAGNOSIS — I10 Essential (primary) hypertension: Secondary | ICD-10-CM

## 2014-12-11 DIAGNOSIS — I214 Non-ST elevation (NSTEMI) myocardial infarction: Principal | ICD-10-CM

## 2014-12-11 DIAGNOSIS — I509 Heart failure, unspecified: Secondary | ICD-10-CM

## 2014-12-11 DIAGNOSIS — I209 Angina pectoris, unspecified: Secondary | ICD-10-CM

## 2014-12-11 DIAGNOSIS — R0902 Hypoxemia: Secondary | ICD-10-CM | POA: Insufficient documentation

## 2014-12-11 DIAGNOSIS — I5041 Acute combined systolic (congestive) and diastolic (congestive) heart failure: Secondary | ICD-10-CM | POA: Insufficient documentation

## 2014-12-11 DIAGNOSIS — I429 Cardiomyopathy, unspecified: Secondary | ICD-10-CM

## 2014-12-11 DIAGNOSIS — R079 Chest pain, unspecified: Secondary | ICD-10-CM | POA: Insufficient documentation

## 2014-12-11 DIAGNOSIS — G4733 Obstructive sleep apnea (adult) (pediatric): Secondary | ICD-10-CM

## 2014-12-11 HISTORY — PX: CARDIAC CATHETERIZATION: SHX172

## 2014-12-11 LAB — POCT I-STAT 3, ART BLOOD GAS (G3+)
Acid-Base Excess: 2 mmol/L (ref 0.0–2.0)
Bicarbonate: 28.9 mEq/L — ABNORMAL HIGH (ref 20.0–24.0)
O2 Saturation: 89 %
PCO2 ART: 53.5 mmHg — AB (ref 35.0–45.0)
TCO2: 31 mmol/L (ref 0–100)
pH, Arterial: 7.337 — ABNORMAL LOW (ref 7.350–7.450)
pO2, Arterial: 60 mmHg — ABNORMAL LOW (ref 80.0–100.0)

## 2014-12-11 LAB — BASIC METABOLIC PANEL
Anion gap: 5 (ref 5–15)
BUN: 17 mg/dL (ref 6–23)
CHLORIDE: 97 mmol/L (ref 96–112)
CO2: 32 mmol/L (ref 19–32)
Calcium: 8 mg/dL — ABNORMAL LOW (ref 8.4–10.5)
Creatinine, Ser: 0.91 mg/dL (ref 0.50–1.35)
GFR calc Af Amer: >90 mL/min (ref >90)
GLUCOSE: 167 mg/dL — AB (ref 70–99)
Potassium: 4.1 mmol/L (ref 3.5–5.1)
Sodium: 134 mmol/L — ABNORMAL LOW (ref 135–145)

## 2014-12-11 LAB — POCT ACTIVATED CLOTTING TIME: Activated Clotting Time: 147 seconds

## 2014-12-11 LAB — HEPARIN LEVEL (UNFRACTIONATED): HEPARIN UNFRACTIONATED: 0.65 [IU]/mL (ref 0.30–0.70)

## 2014-12-11 LAB — HEMOGLOBIN A1C
HEMOGLOBIN A1C: 8.1 % — AB (ref 4.8–5.6)
Mean Plasma Glucose: 186 mg/dL

## 2014-12-11 LAB — CBC
HCT: 40.5 % (ref 39.0–52.0)
Hemoglobin: 13.6 g/dL (ref 13.0–17.0)
MCH: 32.4 pg (ref 26.0–34.0)
MCHC: 33.6 g/dL (ref 30.0–36.0)
MCV: 96.4 fL (ref 78.0–100.0)
Platelets: 161 10*3/uL (ref 150–400)
RBC: 4.2 MIL/uL — ABNORMAL LOW (ref 4.22–5.81)
RDW: 13 % (ref 11.5–15.5)
WBC: 6.2 10*3/uL (ref 4.0–10.5)

## 2014-12-11 SURGERY — RIGHT/LEFT HEART CATH AND CORONARY ANGIOGRAPHY

## 2014-12-11 MED ORDER — SODIUM CHLORIDE 0.9 % IV SOLN
INTRAVENOUS | Status: DC
  Administered 2014-12-11: 16:00:00 via INTRAVENOUS

## 2014-12-11 MED ORDER — LIDOCAINE HCL (PF) 1 % IJ SOLN
INTRAMUSCULAR | Status: AC
  Filled 2014-12-11: qty 30

## 2014-12-11 MED ORDER — FENTANYL CITRATE 0.05 MG/ML IJ SOLN
INTRAMUSCULAR | Status: AC
  Filled 2014-12-11: qty 2

## 2014-12-11 MED ORDER — ACETAMINOPHEN 325 MG PO TABS: 650.0000 mg | ORAL_TABLET | ORAL | Status: DC | PRN

## 2014-12-11 MED ORDER — ONDANSETRON HCL 4 MG/2ML IJ SOLN: 4.0000 mg | Freq: Four times a day (QID) | INTRAMUSCULAR | Status: DC | PRN

## 2014-12-11 MED ORDER — NITROGLYCERIN 1 MG/10 ML FOR IR/CATH LAB
INTRA_ARTERIAL | Status: AC
  Filled 2014-12-11: qty 10

## 2014-12-11 MED ORDER — SODIUM CHLORIDE 0.9 % IV SOLN: INTRAVENOUS | Status: AC

## 2014-12-11 MED ORDER — MORPHINE SULFATE 2 MG/ML IJ SOLN
2.0000 mg | INTRAMUSCULAR | Status: DC | PRN
  Administered 2014-12-12: 2 mg via INTRAVENOUS
  Filled 2014-12-11: qty 1

## 2014-12-11 MED ORDER — HEPARIN (PORCINE) IN NACL 2-0.9 UNIT/ML-% IJ SOLN
INTRAMUSCULAR | Status: AC
  Filled 2014-12-11: qty 1000

## 2014-12-11 MED ORDER — ASPIRIN 81 MG PO CHEW
81.0000 mg | CHEWABLE_TABLET | Freq: Every day | ORAL | Status: DC
  Administered 2014-12-12 – 2014-12-13 (×2): 81 mg via ORAL
  Filled 2014-12-11 (×2): qty 1

## 2014-12-11 MED ORDER — HEPARIN SODIUM (PORCINE) 5000 UNIT/ML IJ SOLN
5000.0000 [IU] | Freq: Three times a day (TID) | INTRAMUSCULAR | Status: DC
  Administered 2014-12-11 – 2014-12-13 (×5): 5000 [IU] via SUBCUTANEOUS
  Filled 2014-12-11 (×6): qty 1

## 2014-12-11 NOTE — CV Procedure (Signed)
Oscar [name] is a 39 y.o. male    [id number] LOCATION:  FACILITY: [place]  PHYSICIAN: Quay Burow, M.D. 1976/01/09   DATE OF PROCEDURE:  12/11/2014  DATE OF DISCHARGE:     CARDIAC CATHETERIZATION     History obtained from chart review. Oscar [name] is a 39 year old morbidly overweight African-American male admitted on 12/09/14 with congestive heart failure, SVT converted with adenosine with mildly positive enzymes. His ejection fraction is 25%. He presents now for right and left heart cardiac catheterization to define his anatomy and physiology.   PROCEDURE DESCRIPTION:   The patient was brought to the second floor [facility] cath lab in the postabsorptive state. He was not premedicated. His right groinwas prepped and shaved in usual sterile fashion. Xylocaine 1% was used for local anesthesia. A 5 French sheath was inserted into the right common femoral artery using standard Seldinger technique. A 7 French sheath was inserted into the right common femoral vein. A 7 French balloon tipped thermodilution Swan-Ganz catheter was advanced through the right heart chambers appearing sequential pressures. 5 French right and left Judkins diagnostic catheters along with a 5 French pigtail catheter were used for selective coronary angiography and obtaining left heart pressures. Visipaque dye was used for the entirety of the case. Retrograde aortic, left ventricular and pullback pressures were recorded.   HEMODYNAMICS:    AO SYSTOLIC/AO DIASTOLIC: 578/46   LV SYSTOLIC/LV DIASTOLIC: 962/95  Right atrial pressure: 12/11, mean equals 9  Right ventricular pressure: 41/12  Pulmonary artery pressure: Systolic 36, diastolic 7, mean equals 22  Pulmonary capillary wedge pressure: A-wave equals 24, V-wave equals 24, mean equals 22  ANGIOGRAPHIC RESULTS:   1. Left main; normal  2. LAD; normal 3. Left circumflex; dominant and normal.  4. Right coronary artery; nondominant and normal 5.  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 Continue medical therapy will be recommended. A right common femoral angiogram was performed and his puncture site was closed with a MYNX device. The venous sheath was pull up old also in the Cath Lab and pressure held. The patient left the lab in stable condition.  Lorretta Harp MD, [facility] 12/11/2014 4:42 PM

## 2014-12-11 NOTE — Progress Notes (Signed)
Utilization review completed. Roylee Chaffin, RN, BSN. 

## 2014-12-11 NOTE — Interval H&P Note (Signed)
Cath Lab Visit (complete for each Cath Lab visit)  Clinical Evaluation Leading to the Procedure:   ACS: Yes.    Non-ACS:    Anginal Classification: CCS IV  Anti-ischemic medical therapy: Minimal Therapy (1 class of medications)  Non-Invasive Test Results: No non-invasive testing performed  Prior CABG: No previous CABG      History and Physical Interval Note:  12/11/2014 3:49 PM  Oscar [name]  has presented today for surgery, with the diagnosis of NSTEMi  The various methods of treatment have been discussed with the patient and family. After consideration of risks, benefits and other options for treatment, the patient has consented to  Procedure(s): LEFT HEART CATHETERIZATION WITH CORONARY ANGIOGRAM (N/A) as a surgical intervention .  The patient's history has been reviewed, patient examined, no change in status, stable for surgery.  I have reviewed the patient's chart and labs.  Questions were answered to the patient's satisfaction.     Lorretta Harp

## 2014-12-11 NOTE — Progress Notes (Signed)
ANTICOAGULATION CONSULT NOTE - Follow Up Consult  Pharmacy Consult for heparin Indication: NSTEMI  No Known Allergies  Patient Measurements: Height: 5\' 4"  (162.6 cm) Weight: (!) 341 lb (154.677 kg) IBW/kg (Calculated) : 59.2 Heparin Dosing Weight: 109 kg  Vital Signs: Temp: 97.6 F (36.4 C) (04/11 0802) Temp Source: Oral (04/11 0802) BP: 158/93 mmHg (04/11 0943) Pulse Rate: 79 (04/11 0943)  Labs:  Recent Labs  12/10/14 0007  12/10/14 0352 12/10/14 1310 12/10/14 1631 12/10/14 2153 12/11/14 0250  HGB  --   --  14.6  --   --  14.4 13.6  HCT  --   --  43.1  --   --  42.2 40.5  PLT  --   --  128*  --   --  165 161  LABPROT  --   --   --   --  13.8  --   --   INR  --   --   --   --  1.05  --   --   HEPARINUNFRC  --   < > 0.19* >2.20* 0.36 0.14* 0.65  CREATININE 1.20  --  1.21  --   --   --  0.91  TROPONINI 0.68*  --  0.63* 0.44*  --   --   --   < > = values in this interval not displayed.  Estimated Creatinine Clearance: 150.1 mL/min (by C-G formula based on Cr of 0.91).   Medications:  Infusions:  . heparin 2,300 Units/hr (12/11/14 0800)  . nitroGLYCERIN 20 mcg/min (12/11/14 0800)    Assessment: 39 y/o male on IV heparin for NSTEMI awaiting cath today. Heparin level is therapeutic at 0.65 on 2300 units/hr. No bleeding noted, Hb is stable, platelets back up to 161 (179 on admit).  Goal of Therapy:  Heparin level 0.3-0.7 units/ml Monitor platelets by anticoagulation protocol: Yes   Plan:  - Continue heparin drip at 2300 units/hr - F/u plans for anticoagulation after cath today.  Uvaldo Rising, BCPS  Clinical Pharmacist Pager [phone]  12/11/2014 11:44 AM

## 2014-12-11 NOTE — H&P (View-Only) (Signed)
SUBJECTIVE: No chest pain this am  BP 151/89 mmHg  Pulse 78  Temp(Src) 98.1 F (36.7 C) (Oral)  Resp 20  Ht 5\' 4"  (1.626 m)  Wt 341 lb (154.677 kg)  BMI 58.50 kg/m2  SpO2 93%  Intake/Output Summary (Last 24 hours) at 12/11/14 0706 Last data filed at 12/11/14 0700  Gross per 24 hour  Intake 1969.65 ml  Output   2550 ml  Net -580.35 ml    PHYSICAL EXAM General: Well developed, well nourished, in no acute distress. Alert and oriented x 3.  Psych:  Good affect, responds appropriately Neck: + JVD. No masses noted.  Lungs: Clear bilaterally with no wheezes or rhonci noted.  Heart: RRR with no murmurs noted. Abdomen: Bowel sounds are present. Soft, non-tender.  Extremities: No lower extremity edema.   LABS: Basic Metabolic Panel:  Recent Labs  12/10/14 0007 12/10/14 0352 12/11/14 0250  NA 137 137 134*  K 4.1 4.0 4.1  CL 99 98 97  CO2 31 26 32  GLUCOSE 140* 192* 167*  BUN 20 21 17   CREATININE 1.20 1.21 0.91  CALCIUM 8.1* 8.2* 8.0*  MG 1.6  --   --    CBC:  Recent Labs  12/09/14 1810  12/10/14 2153 12/11/14 0250  WBC 5.2  < > 7.2 6.2  NEUTROABS 3.2  --   --   --   HGB 15.9  < > 14.4 13.6  HCT 45.8  < > 42.2 40.5  MCV 93.1  < > 95.7 96.4  PLT 179  < > 165 161  < > = values in this interval not displayed. Cardiac Enzymes:  Recent Labs  12/10/14 0007 12/10/14 0352 12/10/14 1310  TROPONINI 0.68* 0.63* 0.44*   Fasting Lipid Panel:  Recent Labs  12/10/14 0352  CHOL 236*  HDL 28*  LDLCALC 169*  TRIG 195*  CHOLHDL 8.4    Current Meds: . antiseptic oral rinse  7 mL Mouth Rinse BID  . aspirin EC  81 mg Oral Daily  . atorvastatin  80 mg Oral q1800  . carvedilol  6.25 mg Oral BID WC  . pantoprazole  40 mg Oral Daily  . sodium chloride  3 mL Intravenous Q12H   Echo 12/10/14: Left ventricle: Diffuse hypokinesis worse in the inferior wall The cavity size was severely dilated. There was moderate concentric hypertrophy. The estimated  ejection fraction was 25%. - Mitral valve: Calcified annulus. Mildly thickened leaflets . There was mild regurgitation. - Left atrium: The atrium was moderately dilated.  ASSESSMENT AND PLAN: 39 yo with h/o SVT, "borderline obstructive CAD," HTN, smoking, suspected OSA was admitted with chest pain and SVT. Troponin elevated with peak of 0.6 and resolution of chest pain when SVT resolved.   1. Elevated troponin: This could represent demand ischemia with SVT but with new LV dysfunction/wall motion abnormality and chest pain will plan cath today to exclude obstructive CAD. Continue ASA, Coreg, statin, heparin gtt, NTG drip.   2. HTN: For now, manage with NTG gtt and Coreg. Holding lisinopril pre-cath given CKD. Restart afterwards.   3. Acute combined diastolic and systolic CHF: Echo yesterday with LVEF=25% with wall motion abnormality. He is followed by Dr. [name] in [place] and has not bee told before about LV dysfunction. He has volume overload on exam with elevated JVP and dyspnea. BNP not high but he is obese. A right heart cath would be helpful today as well to define volume status.   4. CKD: Stable  creatinine. As above, hold ACE-Inh prior to cath.   5. SVT: Resolved via EMS with adenosine. He has had in the past. Not on beta blocker or CCB at home.   6. Suspect OSA, possible OHS/OSA given low oxygen saturation currently. Will need workup of this.   Right and left heart cath today in the afternoon. Clear liquid breakfast. Likely transfer to floor on telemetry unit after cath. His long term follow up is with Dr. [name] in [place].     Oscar [name]  4/11/20167:06 AM

## 2014-12-11 NOTE — Progress Notes (Signed)
SUBJECTIVE: No chest pain this am  BP 151/89 mmHg  Pulse 78  Temp(Src) 98.1 F (36.7 C) (Oral)  Resp 20  Ht 5\' 4"  (1.626 m)  Wt 341 lb (154.677 kg)  BMI 58.50 kg/m2  SpO2 93%  Intake/Output Summary (Last 24 hours) at 12/11/14 0706 Last data filed at 12/11/14 0700  Gross per 24 hour  Intake 1969.65 ml  Output   2550 ml  Net -580.35 ml    PHYSICAL EXAM General: Well developed, well nourished, in no acute distress. Alert and oriented x 3.  Psych:  Good affect, responds appropriately Neck: + JVD. No masses noted.  Lungs: Clear bilaterally with no wheezes or rhonci noted.  Heart: RRR with no murmurs noted. Abdomen: Bowel sounds are present. Soft, non-tender.  Extremities: No lower extremity edema.   LABS: Basic Metabolic Panel:  Recent Labs  12/10/14 0007 12/10/14 0352 12/11/14 0250  NA 137 137 134*  K 4.1 4.0 4.1  CL 99 98 97  CO2 31 26 32  GLUCOSE 140* 192* 167*  BUN 20 21 17   CREATININE 1.20 1.21 0.91  CALCIUM 8.1* 8.2* 8.0*  MG 1.6  --   --    CBC:  Recent Labs  12/09/14 1810  12/10/14 2153 12/11/14 0250  WBC 5.2  < > 7.2 6.2  NEUTROABS 3.2  --   --   --   HGB 15.9  < > 14.4 13.6  HCT 45.8  < > 42.2 40.5  MCV 93.1  < > 95.7 96.4  PLT 179  < > 165 161  < > = values in this interval not displayed. Cardiac Enzymes:  Recent Labs  12/10/14 0007 12/10/14 0352 12/10/14 1310  TROPONINI 0.68* 0.63* 0.44*   Fasting Lipid Panel:  Recent Labs  12/10/14 0352  CHOL 236*  HDL 28*  LDLCALC 169*  TRIG 195*  CHOLHDL 8.4    Current Meds: . antiseptic oral rinse  7 mL Mouth Rinse BID  . aspirin EC  81 mg Oral Daily  . atorvastatin  80 mg Oral q1800  . carvedilol  6.25 mg Oral BID WC  . pantoprazole  40 mg Oral Daily  . sodium chloride  3 mL Intravenous Q12H   Echo 12/10/14: Left ventricle: Diffuse hypokinesis worse in the inferior wall The cavity size was severely dilated. There was moderate concentric hypertrophy. The estimated  ejection fraction was 25%. - Mitral valve: Calcified annulus. Mildly thickened leaflets . There was mild regurgitation. - Left atrium: The atrium was moderately dilated.  ASSESSMENT AND PLAN: 39 yo with h/o SVT, "borderline obstructive CAD," HTN, smoking, suspected OSA was admitted with chest pain and SVT. Troponin elevated with peak of 0.6 and resolution of chest pain when SVT resolved.   1. Elevated troponin: This could represent demand ischemia with SVT but with new LV dysfunction/wall motion abnormality and chest pain will plan cath today to exclude obstructive CAD. Continue ASA, Coreg, statin, heparin gtt, NTG drip.   2. HTN: For now, manage with NTG gtt and Coreg. Holding lisinopril pre-cath given CKD. Restart afterwards.   3. Acute combined diastolic and systolic CHF: Echo yesterday with LVEF=25% with wall motion abnormality. He is followed by Dr. [name] in [place] and has not bee told before about LV dysfunction. He has volume overload on exam with elevated JVP and dyspnea. BNP not high but he is obese. A right heart cath would be helpful today as well to define volume status.   4. CKD: Stable  creatinine. As above, hold ACE-Inh prior to cath.   5. SVT: Resolved via EMS with adenosine. He has had in the past. Not on beta blocker or CCB at home.   6. Suspect OSA, possible OHS/OSA given low oxygen saturation currently. Will need workup of this.   Right and left heart cath today in the afternoon. Clear liquid breakfast. Likely transfer to floor on telemetry unit after cath. His long term follow up is with Dr. [name] in [place].     Oscar [name]  4/11/20167:06 AM

## 2014-12-12 DIAGNOSIS — J9612 Chronic respiratory failure with hypercapnia: Secondary | ICD-10-CM

## 2014-12-12 DIAGNOSIS — R7989 Other specified abnormal findings of blood chemistry: Secondary | ICD-10-CM

## 2014-12-12 DIAGNOSIS — R778 Other specified abnormalities of plasma proteins: Secondary | ICD-10-CM | POA: Insufficient documentation

## 2014-12-12 DIAGNOSIS — I428 Other cardiomyopathies: Secondary | ICD-10-CM | POA: Insufficient documentation

## 2014-12-12 DIAGNOSIS — I5041 Acute combined systolic (congestive) and diastolic (congestive) heart failure: Secondary | ICD-10-CM

## 2014-12-12 LAB — HEMOGLOBIN A1C
Hgb A1c MFr Bld: 8 % — ABNORMAL HIGH (ref 4.8–5.6)
MEAN PLASMA GLUCOSE: 183 mg/dL

## 2014-12-12 MED ORDER — LISINOPRIL 20 MG PO TABS
20.0000 mg | ORAL_TABLET | Freq: Every day | ORAL | Status: DC
  Administered 2014-12-12 – 2014-12-13 (×2): 20 mg via ORAL
  Filled 2014-12-12 (×2): qty 1

## 2014-12-12 MED ORDER — FUROSEMIDE 10 MG/ML IJ SOLN
40.0000 mg | Freq: Every day | INTRAMUSCULAR | Status: DC
  Administered 2014-12-12 – 2014-12-13 (×2): 40 mg via INTRAVENOUS
  Filled 2014-12-12 (×2): qty 4

## 2014-12-12 NOTE — Progress Notes (Signed)
CSW ordered received to assist patient with medication assistance. This is an RNCM function and CSW will notify her of order.  CSW will sign off but will be available if needed.  Oscar [name]. [name], [place]

## 2014-12-12 NOTE — Progress Notes (Signed)
SUBJECTIVE: No chest pain. Breathing is better.   BP 151/99 mmHg  Pulse 86  Temp(Src) 98.1 F (36.7 C) (Oral)  Resp 23  Ht 5\' 4"  (1.626 m)  Wt 335 lb 15.7 oz (152.4 kg)  BMI 57.64 kg/m2  SpO2 96%  Intake/Output Summary (Last 24 hours) at 12/12/14 1044 Last data filed at 12/12/14 0358  Gross per 24 hour  Intake 655.18 ml  Output   2990 ml  Net -2334.82 ml    PHYSICAL EXAM General: Well developed, well nourished, in no acute distress. Alert and oriented x 3.  Psych:  Good affect, responds appropriately Neck: Unable to assess JVD. No masses noted.  Lungs: Clear bilaterally with no wheezes or rhonci noted.  Heart: RRR with no murmurs noted. Abdomen: Bowel sounds are present. Soft, non-tender.  Extremities: No lower extremity edema.   LABS: Basic Metabolic Panel:  Recent Labs  12/10/14 0007 12/10/14 0352 12/11/14 0250  NA 137 137 134*  K 4.1 4.0 4.1  CL 99 98 97  CO2 31 26 32  GLUCOSE 140* 192* 167*  BUN 20 21 17   CREATININE 1.20 1.21 0.91  CALCIUM 8.1* 8.2* 8.0*  MG 1.6  --   --    CBC:  Recent Labs  12/09/14 1810  12/10/14 2153 12/11/14 0250  WBC 5.2  < > 7.2 6.2  NEUTROABS 3.2  --   --   --   HGB 15.9  < > 14.4 13.6  HCT 45.8  < > 42.2 40.5  MCV 93.1  < > 95.7 96.4  PLT 179  < > 165 161  < > = values in this interval not displayed. Cardiac Enzymes:  Recent Labs  12/10/14 0007 12/10/14 0352 12/10/14 1310  TROPONINI 0.68* 0.63* 0.44*   Fasting Lipid Panel:  Recent Labs  12/10/14 0352  CHOL 236*  HDL 28*  LDLCALC 169*  TRIG 195*  CHOLHDL 8.4    Current Meds: . antiseptic oral rinse  7 mL Mouth Rinse BID  . aspirin  81 mg Oral Daily  . atorvastatin  80 mg Oral q1800  . carvedilol  6.25 mg Oral BID WC  . heparin subcutaneous  5,000 Units Subcutaneous 3 times per day  . pantoprazole  40 mg Oral Daily   Echo 12/10/14: Left ventricle: Diffuse hypokinesis worse in the inferior wall The cavity size was severely dilated. There  was moderate concentric hypertrophy. The estimated ejection fraction was 25%. - Mitral valve: Calcified annulus. Mildly thickened leaflets . There was mild regurgitation. - Left atrium: The atrium was moderately dilated.  Cardiac cath 9/56/21: AO SYSTOLIC/AO DIASTOLIC: 308/65 LV SYSTOLIC/LV DIASTOLIC: 784/69 Right atrial pressure: 12/11, mean equals 9 Right ventricular pressure: 41/12 Pulmonary artery pressure: Systolic 36, diastolic 7, mean equals 22 Pulmonary capillary wedge pressure: A-wave equals 24, V-wave equals 24, mean equals 22 ANGIOGRAPHIC RESULTS:  1. Left main; normal  2. LAD; normal 3. Left circumflex; dominant and normal.  4. Right coronary artery; nondominant and normal 5. Left ventriculography;left particular views not performed by left heart pressures and pullback pressures were recorded  ASSESSMENT AND PLAN: 39 yo with h/o SVT, "borderline obstructive CAD," HTN, smoking, suspected OSA was admitted with chest pain and SVT. Troponin elevated with peak of 0.6 and resolution of chest pain when SVT resolved. Echo with LVEF=25%. Cardiac cath with no CAD.   1. Elevated troponin: Likely demand ischemia with SVT. He has no CAD by cath yesterday. Continue ASA, Coreg, statin.    2.  HTN: Will continue Coreg and will restart lisinopril.    3. Acute combined diastolic and systolic CHF: Echo with XHFS=14% with wall motion abnormality. He is followed by Dr. [name] in [place] and has not been told before about LV dysfunction. He has volume overload on exam with elevated JVP and dyspnea. PCWP is elevated. Will continue diuresis with IV Lasix.    4. CKD: Stable creatinine.  5. SVT: Resolved via EMS with adenosine. He has had in the past. Not on beta blocker or CCB at home. Now on beta blocker.   6. Respiratory distress/Suspect OSA: Appreciate Pulm consult team assistance.    7. Non-ischemic cardiomyopathy: Medical management.      Oscar [name]  4/12/201610:44 AM

## 2014-12-12 NOTE — Progress Notes (Signed)
CARDIAC REHAB PHASE I   PRE:  Rate/Rhythm: 73 SR  BP:  Supine:   Sitting:   Standing:    SaO2: 95 RA  MODE:  Ambulation: 740 ft   POST:  Rate/Rhythm: 90 SR  BP:  Supine:   Sitting:149/77   SaO2: 91-93 RA 1335-1415 On arrival pt was sitting outside of his room in chair, states that he had walked from room to the chair. Pt was able to walk 740 feet without c/o. VS stable Pt's RA sat during walk 91-93%. Gait steady. Pt back to recliner after walk. Completed CHF education with pt. I gave him CHF packet. I discussed with him CHF zones, daily weights,sodium and fluid restrictions and when to call MD and 911. Pt voices understanding. I  discussed smoking cessation with pt. He plans to quit cold [place]. I gave him cessation information. Placed CHF video on for pt to watch.  Rodney Langton RN 12/12/2014 2:16 PM

## 2014-12-12 NOTE — Care Management Note (Addendum)
    Page 1 of 2   12/13/2014     11:52:04 AM CARE MANAGEMENT NOTE 12/13/2014  Patient:  Oscar [name], Oscar [name]   Account Number:  [id number]  Date Initiated:  12/11/2014  Documentation initiated by:  MAYO,HENRIETTA  Subjective/Objective Assessment:   dx SVT/NSTEMI; lives with sister    Primary Care @ [facility] in [place]     Action/Plan:   Anticipated DC Date:     Anticipated DC Plan:    In-house referral  [place]  CM consult  [facility]      Choice offered to / List presented to:             Status of service:  Completed, signed off Medicare Important Message given?  NO (If response is "NO", the following Medicare IM given date fields will be blank) Date Medicare IM given:   Medicare IM given by:   Date Additional Medicare IM given:   Additional Medicare IM given by:    Discharge Disposition:  HOME/SELF CARE  Per UR Regulation:  Reviewed for med. necessity/level of care/duration of stay  If discussed at [place] of Stay Meetings, dates discussed:    Comments:  12-13-14 1128 Jacqlyn Krauss, RN,BSN [phone] CM did receive a call from [place]. Center needed application order verification for BIPAP split night test. Tarri Fuller PA cardiology gave verbal order over phone to [facility] schedular at the center for correct test. Gaspar Bidding placed order for BIPAP in EPIC. CM did call [place] to see if they could work with pt in regards in BIPAP. CM did call [facility] to see if Medicaid would possibly be approved and pt has Low EF. [facility] felt like pt had good chance for approval. Medicaid would take at least 90 days to be in effect. MATCH not needed due to meds can be obtained via [facility] $4.00 med list or the [facility]. Unable to set up a follow up appointment @ the Clinic. Placed on AVS pt to f/u in one week for diabetes management. No further needs from CM at this time.  12/12/14 1005 [place] MSN BSN CCM Application for sleep study faxed to [employer].   Pt has been getting meds for 0 copay through [facility], will qualify for free 34-day supply of meds through [facility] when discharged.  12/11/14 [place] MSN BSN CCM Per pt, he has applied for disability and medicaid, has retained attorney for disability application.  Will need BiPAP and sleep study on discharge, pt requests sleep lab in [place]. 4287 Provided application for [place] sleep lab. [facility] will provide BiPAP for dischange based on CO2 level > 50.

## 2014-12-12 NOTE — Consult Note (Signed)
PULMONARY  / CRITICAL CARE MEDICINE  Name: Oscar [name] MRN: [id number] DOB: [birth year]    LOS: 8  REFERRING MD :  Cardiology  CHIEF COMPLAINT: Desaturation while asleep/OSA  BRIEF PATIENT DESCRIPTION: Oscar [name] is a 39 yo man with PMHx of HTN, suspected OSA, CHF, CKD, borderline obstructive CAD, pancreatitis and history of SVT who presented in SVT and found to have elevated troponins. Cardiology is managing patient for demand ischemia due to SVT versus ACS with plaque rupture. Patient will go for cath tomorrow. PCCM consulted for management of desaturation while asleep on nasal cannula.   HISTORY OF PRESENT ILLNESS:  Oscar [name] is a 39 yo man with PMHx of HTN, suspected OSA, CHF, CKD, borderline obstructive CAD, pancreatitis and history of SVT who presented in SVT and found to have elevated troponins. In the ED, patient was given adenosine with conversion to sinus rhythm. Heparin gtt and nitro gtt was started and ASA given for possible ACS/NSTEMI with elevated troponin 0.68>0.63>0.44. Chest pain resolved and patient remained in sinus rhythm. Cardiology is managing patient for demand ischemia due to SVT versus ACS with plaque rupture. Patient will go for cath tomorrow. Patient has been satting in the low-mid 90s while on nasal cannula 3-4 L during the day. Patient was desaturating from this to 79% while sleeping on 5 L via [place]. Patient also noted to have periods of apnea lasting for 25 seconds. PCCM was consulted for management. Patient was sleeping comfortably but arousable while on BiPAP during visit.    Subjective: pt doing well   VITAL SIGNS: Temp:  [97.5 F (36.4 C)-98.1 F (36.7 C)] 98.1 F (36.7 C) (04/12 0734) Pulse Rate:  [65-82] 72 (04/11 2258) Resp:  [15-23] 23 (04/12 0734) BP: (130-168)/(72-113) 145/82 mmHg (04/12 0734) SpO2:  [94 %-99 %] 96 % (04/12 0734) Weight:  [335 lb 15.7 oz (152.4 kg)] 335 lb 15.7 oz (152.4 kg) (04/12 0401)     INTAKE / OUTPUT: Intake/Output       04/11 0701 - 04/12 0700 04/12 0701 - 04/13 0700   P.O. 800    I.V. (mL/kg) 399.7 (2.6)    Total Intake(mL/kg) 1199.7 (7.9)    Urine (mL/kg/hr) 3790 (1)    Stool     Total Output 3790     Net -2590.3           PHYSICAL EXAMINATION: General: obese male, NAD Neck: Morbidly obese neck, unable to effectively assess for JVD Cardiovascular: RRR, no murmurs, rubs, gallops Pulmonary/Chest: Clear to auscultation in anterior fields, difficult to auscultate posterior fields given obesity, no wheezes, rales, or rhonchi. Abdominal: Soft, obese, non-tender, non-distended, BS + Extremities: Trace pitting edema bilaterally, right arterial site c/d/i Skin: Warm, dry and intact. No rashes or erythema.  LABS: Cbc  Recent Labs Lab 12/10/14 0352 12/10/14 2153 12/11/14 0250  WBC 5.6 7.2 6.2  HGB 14.6 14.4 13.6  HCT 43.1 42.2 40.5  PLT 128* 165 161    Chemistry   Recent Labs Lab 12/10/14 0007 12/10/14 0352 12/11/14 0250  NA 137 137 134*  K 4.1 4.0 4.1  CL 99 98 97  CO2 31 26 32  BUN 20 21 17   CREATININE 1.20 1.21 0.91  CALCIUM 8.1* 8.2* 8.0*  MG 1.6  --   --   GLUCOSE 140* 192* 167*    Liver fxn  Recent Labs Lab 12/09/14 1810 12/10/14 0007  AST 49* 39*  ALT 45 41  ALKPHOS 139* 132*  BILITOT 0.7 0.6  PROT 7.2  7.1  ALBUMIN 3.4* 3.0*   coags  Recent Labs Lab 12/10/14 1631  INR 1.05   Sepsis markers  Recent Labs Lab 12/09/14 1833 12/09/14 2126  LATICACIDVEN 1.61 0.86   Cardiac markers  Recent Labs Lab 12/10/14 0007 12/10/14 0352 12/10/14 1310  TROPONINI 0.68* 0.63* 0.44*   IMAGING: CT abdomen/pelvix 12/09/14>>Stable fat containing periumbilical hernia. Stable retroperitoneal and inguinal adenopathy compared to prior exam most likely reactive or benign. No other significant abnormality seen in the abdomen or pelvis. CXR 12/09/2014>> No active disease.Significant cardiomegaly.  DIAGNOSES: Nonischemic cardiomyopathy EF 25% Daytime  hypercapnea  ASSESSMENT / PLAN:  PULMONARY  ASSESSMENT: Pt with mild desaturation down to 91% on 5L O2 via nasal canula. No prior sleep study in the system and pt is not on CPAP at home. Pt is morbidly obese and is intermittently apneic while asleep on exam. 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 Cr improved from admission.  PLAN:  ACEI being held prior to heart catheterization BMP in am  CLINICAL SUMMARY: Oscar [name] is a 39 yo man with PMHx of HTN, suspected OSA, PCCM was consulted for management. Patient satting well on BiPAP at night.   Clinton Gallant, MD IM PGY-3 Pgr: [phone] 12/12/2014 8:26 AM   Attending:  I have seen and examined the patient with nurse practitioner/resident and agree with the note above.   [name] has non-ischemic cardiomyopathy on his echo.  He also has daytime resting hypercapnea.  On exam: Lungs clear CV: normal S1/S2, cannot assess JVD Ext: warm trace edema  RHC numbers reviewed  Impression/plan: 1) Chronic respiratory failure with hypercapnea: given his severe ischemic cardiomyopathy his obstructive sleep apnea (known) and daytime resting hypercapne (newly diagnosed yesterday) are both significantly contribute to his cardiovascular disease.  He needs BIPAP qHS and O2 bleed-in with it sufficient to keep his O2 saturation > 88% 2) Non-ischemic cardiomyopathy: per cardiology  PCCM to sign off, call if questions  Roselie Awkward, MD [place] PCCM Pager: [phone] Cell: [phone] If no  response, call [phone]

## 2014-12-13 LAB — CBC
HCT: 43.2 % (ref 39.0–52.0)
Hemoglobin: 14.5 g/dL (ref 13.0–17.0)
MCH: 32.1 pg (ref 26.0–34.0)
MCHC: 33.6 g/dL (ref 30.0–36.0)
MCV: 95.6 fL (ref 78.0–100.0)
Platelets: 166 10*3/uL (ref 150–400)
RBC: 4.52 MIL/uL (ref 4.22–5.81)
RDW: 12.6 % (ref 11.5–15.5)
WBC: 4.9 10*3/uL (ref 4.0–10.5)

## 2014-12-13 LAB — BASIC METABOLIC PANEL
ANION GAP: 7 (ref 5–15)
BUN: 13 mg/dL (ref 6–23)
CALCIUM: 8.6 mg/dL (ref 8.4–10.5)
CO2: 30 mmol/L (ref 19–32)
Chloride: 100 mmol/L (ref 96–112)
Creatinine, Ser: 0.87 mg/dL (ref 0.50–1.35)
GFR calc non Af Amer: >90 mL/min (ref >90)
GLUCOSE: 153 mg/dL — AB (ref 70–99)
POTASSIUM: 4.3 mmol/L (ref 3.5–5.1)
Sodium: 137 mmol/L (ref 135–145)

## 2014-12-13 MED ORDER — ATORVASTATIN CALCIUM 80 MG PO TABS: 80.0000 mg | ORAL_TABLET | Freq: Every day | ORAL | Status: DC

## 2014-12-13 MED ORDER — CARVEDILOL 6.25 MG PO TABS: 6.2500 mg | ORAL_TABLET | Freq: Two times a day (BID) | ORAL | Status: DC

## 2014-12-13 NOTE — Discharge Summary (Signed)
Physician Discharge Summary     Cardiologist:  Ileene Rubens, [facility] Patient ID: Oscar [name] MRN: [id number] DOB/AGE: [birth year] 39 y.o.  Admit date: 12/09/2014 Discharge date: 12/13/2014  Admission Diagnoses: SVT, NSTEMI  Discharge Diagnoses:  Active Problems:   SVT (supraventricular tachycardia)   NSTEMI (non-ST elevated myocardial infarction)   Hypertension   Chest pain   OSA (obstructive sleep apnea)   Hypoxemia   Acute combined systolic and diastolic heart failure   Chronic respiratory failure with hypercapnia   Elevated troponin   Non-ischemic cardiomyopathy   Discharged Condition: stable  Hospital Course:   [name] is a 39 yo man with PMH of hypertension on lisinopril and furosemide, pancreatitis and prior episode of SVT per history at [facility] with similar presentation and cardiac catheterization per history.   Cardiac cath revealed borderline obstructive disease without PCI/intervention and he was supposed to follow-up but did not who.   He presented with chest pain and shortness of breath. In the ambulance he was noted to be in SVT and code STEMI activated given ST elevation per their read. In the [facility] ER, after he had received 6 mg and 12 mg of adenosine and converted to sinus rhythm, pain improved and ST elevation noted but in setting of significant LVH so code STEMI cancelled.  Heparin gtt started for presumed ACS and troponin 0.13 resulted. He also received aspirin. He reported being pain free after low dose NTG gtt started. He's noticed some swelling in his abdomen and legs at home over the last few weeks. He is compliant with his medications. 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 He continues to smoke ~ 1/2 ppd. He snores at night and has been told he stops breathing.   He was admitted to [facility].  He was given a dose of IV lasix 40mg .  ASA, Coreg 6.25BID and lipitor 80mg (See lipid panel below).  He had no further SVT.  He was taken to  the cath lab for [facility] which revealed normal coronary arteries and moderately elevated filling pressures.  He was given additional IV lasix, was asymptomatic, and appeared to be euvolemic at discharge.  Weight decreased from 340 to 333.  Echo revealed an EF of  25%.  He was continued on lisinopril 20mg .   He was also seen by Pulmonology who started Bipap QHS with O2.  The patient will be scheduled for a sleep study and sent home on Bipap for now.  He might need BP med titration in the office at follow up. Range:  106/82-151/99.  Follow BP in the office.  The patient was seen by Dr. Mare Ferrari who felt he was stable for DC home.   Consults: Pulmonology, Diabetes Coordinator, Heart Failure Coor   Significant Diagnostic Studies:  CARDIAC CATHETERIZATION   History obtained from chart review. Oscar [name] is a 39 year old morbidly overweight African-American male admitted on 12/09/14 with congestive heart failure, SVT converted with adenosine with mildly positive enzymes. His ejection fraction is 25%. He presents now for right and left heart cardiac catheterization to define his anatomy and physiology.   PROCEDURE DESCRIPTION:   The patient was brought to the second floor [place] [facility]iac cath lab in the postabsorptive state. He was not premedicated. His right groinwas prepped and shaved in usual sterile fashion. Xylocaine 1% was used for local anesthesia. A 5 French sheath was inserted into the right common femoral artery using standard Seldinger technique. A 7 French sheath was inserted into the  right common femoral vein. A 7 French balloon tipped thermodilution Swan-Ganz catheter was advanced through the right heart chambers appearing sequential pressures. 5 French right and left Judkins diagnostic catheters along with a 5 French pigtail catheter were used for selective coronary angiography and obtaining left heart pressures. Visipaque dye was used for the entirety of the case. Retrograde aortic, left  ventricular and pullback pressures were recorded.   HEMODYNAMICS:   AO SYSTOLIC/AO DIASTOLIC: 448/18  LV SYSTOLIC/LV DIASTOLIC: 563/14  Right atrial pressure: 12/11, mean equals 9  Right ventricular pressure: 41/12  Pulmonary artery pressure: Systolic 36, diastolic 7, mean equals 22  Pulmonary capillary wedge pressure: A-wave equals 24, V-wave equals 24, mean equals 22  ANGIOGRAPHIC RESULTS:   1. Left main; normal  2. LAD; normal 3. Left circumflex; dominant and normal.  4. Right coronary artery; nondominant and normal 5. 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 Continue medical therapy will be recommended. A right common femoral angiogram was performed and his puncture site was closed with a MYNX device. The venous sheath was pull up old also in the Cath Lab and pressure held. The patient left the lab in stable condition.  Lorretta Harp MD, [facility] 12/11/2014  Echocardiogram, 12/10/14 Study Conclusions  - Left ventricle: Diffuse hypokinesis worse in the inferior wall The cavity size was severely dilated. There was moderate concentric hypertrophy. The estimated ejection fraction was 25%. - Mitral valve: Calcified annulus. Mildly thickened leaflets . There was mild regurgitation. - Left atrium: The atrium was moderately dilated.  Lipid Panel     Component Value Date/Time   CHOL 236* 12/10/2014 0352   TRIG 195* 12/10/2014 0352   HDL 28* 12/10/2014 0352   CHOLHDL 8.4 12/10/2014 0352   VLDL 39 12/10/2014 0352   LDLCALC 169* 12/10/2014 0352       Treatments: See above  Discharge Exam: Blood pressure 150/100, pulse 69, temperature 97.9 F (36.6 C), temperature source Oral, resp. rate 18, height 5\' 6"  (1.676 m), weight 333 lb 4.8 oz (151.184 kg), SpO2  95 %.   Disposition:       Discharge Instructions    Diet - low sodium heart healthy    Complete by:  As directed      Discharge instructions    Complete by:  As directed   Reduce carbohydrate/starch/sugar intake.  This will help you loose weight along with exercise.     Increase activity slowly    Complete by:  As directed             Medication List    TAKE these medications        aspirin EC 325 MG tablet  Take 325 mg by mouth daily.     atorvastatin 80 MG tablet  Commonly known as:  LIPITOR  Take 1 tablet (80 mg total) by mouth daily at 6 PM.     carvedilol 6.25 MG tablet  Commonly known as:  COREG  Take 1 tablet (6.25 mg total) by mouth 2 (two) times daily with a meal.     furosemide 20 MG tablet  Commonly known as:  LASIX  Take 20 mg by mouth daily.     lisinopril 20 MG tablet  Commonly known as:  PRINIVIL,ZESTRIL  Take 20 mg by mouth daily.     omeprazole 20 MG capsule  Commonly known as:  PRILOSEC  Take 20 mg by mouth daily.     potassium chloride SA 20 MEQ tablet  Commonly known as:  K-DUR,KLOR-CON  Take 20 mEq by mouth daily.       Follow-up Information    Follow up with Yolonda Kida., MD On 12/19/2014.   Specialty:  Internal Medicine   Why:  9:45 AM   Contact information:   [place] [address] [phone]       Follow up with [facility].   Why:  BIPAP   Contact information:   [address] [place] [address] [phone]       Follow up with [facility]. Go in 1 week.   Specialty:  Primary Care   Why:  Please follow up for diabetes management.    Contact information:   [place]      Follow up with [facility] On 12/15/2014.   Why:  Please arrive @ 8:45 pm for sleep study until 6:00 am next morning.    Contact information:   [address] [place], [place] [address]     Greater than 30 minutes was spent completing the patient's discharge.    SignedTarri [name], [place] 12/13/2014, 12:01 PM

## 2014-12-13 NOTE — Progress Notes (Signed)
Subjective: No complaints.  Feels back to his usual self.   Objective: Vital signs in last 24 hours: Temp:  [97.5 F (36.4 C)-98.3 F (36.8 C)] 97.9 F (36.6 C) (04/13 0528) Pulse Rate:  [61-70] 69 (04/13 0835) Resp:  [17-20] 18 (04/13 0528) BP: (106-150)/(76-100) 150/100 mmHg (04/13 0835) SpO2:  [94 %-100 %] 95 % (04/13 0528) Weight:  [333 lb 4.8 oz (151.184 kg)-335 lb 8 oz (152.182 kg)] 333 lb 4.8 oz (151.184 kg) (04/13 0528) Last BM Date: 12/12/14  Intake/Output from previous day: 04/12 0701 - 04/13 0700 In: 545 [P.O.:655; I.V.:4] Out: -  Intake/Output this shift:    Medications Current Facility-Administered Medications  Medication Dose Route Frequency Provider Last Rate Last Dose  . acetaminophen (TYLENOL) tablet 650 mg  650 mg Oral Q4H PRN Lorretta Harp, MD      . antiseptic oral rinse (CPC / CETYLPYRIDINIUM CHLORIDE 0.05%) solution 7 mL  7 mL Mouth Rinse BID Larey Dresser, MD   7 mL at 12/13/14 1000  . aspirin chewable tablet 81 mg  81 mg Oral Daily Lorretta Harp, MD   81 mg at 12/13/14 0946  . atorvastatin (LIPITOR) tablet 80 mg  80 mg Oral q1800 Jules Husbands, MD   80 mg at 12/12/14 1713  . carvedilol (COREG) tablet 6.25 mg  6.25 mg Oral BID WC Larey Dresser, MD   6.25 mg at 12/13/14 0835  . furosemide (LASIX) injection 40 mg  40 mg Intravenous Daily Burnell Blanks, MD   40 mg at 12/13/14 0946  . heparin injection 5,000 Units  5,000 Units Subcutaneous 3 times per day Lorretta Harp, MD   5,000 Units at 12/13/14 0554  . lisinopril (PRINIVIL,ZESTRIL) tablet 20 mg  20 mg Oral Daily Burnell Blanks, MD   20 mg at 12/13/14 0946  . morphine 2 MG/ML injection 2 mg  2 mg Intravenous Q1H PRN Lorretta Harp, MD   2 mg at 12/12/14 6256  . nitroGLYCERIN (NITROSTAT) SL tablet 0.4 mg  0.4 mg Sublingual Q5 Min x 3 PRN Jules Husbands, MD      . ondansetron [facility]) injection 4 mg  4 mg Intravenous Q6H PRN Lorretta Harp, MD      . pantoprazole (PROTONIX)  EC tablet 40 mg  40 mg Oral Daily Jules Husbands, MD   40 mg at 12/13/14 0946    PE: General appearance: alert, cooperative and no distress Neck: no JVD Lungs: clear to auscultation bilaterally Heart: regular rate and rhythm, S1, S2 normal, no murmur, click, rub or gallop Extremities: No LEE Pulses: 2+ and symmetric Skin: Warm and dry Neurologic: Grossly normal  Lab Results:   Recent Labs  12/10/14 2153 12/11/14 0250 12/13/14 0440  WBC 7.2 6.2 4.9  HGB 14.4 13.6 14.5  HCT 42.2 40.5 43.2  PLT 165 161 166   BMET  Recent Labs  12/11/14 0250 12/13/14 0440  NA 134* 137  K 4.1 4.3  CL 97 100  CO2 32 30  GLUCOSE 167* 153*  BUN 17 13  CREATININE 0.91 0.87  CALCIUM 8.0* 8.6   PT/INR  Recent Labs  12/10/14 1631  LABPROT 13.8  INR 1.05     Assessment/Plan 39 yo with h/o SVT, "borderline obstructive CAD," HTN, smoking, suspected OSA was admitted with chest pain and SVT. Troponin elevated with peak of 0.6 and resolution of chest pain when SVT resolved. Echo with LVEF=25%. Cardiac cath with no CAD.   Active Problems:  SVT (supraventricular tachycardia)   NSTEMI (non-ST elevated myocardial infarction)   Hypertension   Chest pain   OSA (obstructive sleep apnea)   Hypoxemia   Acute combined systolic and diastolic heart failure   Chronic respiratory failure with hypercapnia   Elevated troponin   Non-ischemic cardiomyopathy  1. Elevated troponin: Likely demand ischemia with SVT. He has no CAD by cath 12/11/14. Continue ASA, Coreg, statin.   2. HTN: Will continue Coreg and will restart lisinopril.   3. Acute combined diastolic and systolic CHF: Echo with TXMI=68% with wall motion abnormality. He is followed by Dr. [name] in [place] and has not been told before about LV dysfunction. He has volume overload on exam with elevated JVP and dyspnea. PCWP is elevated.    Net fluids:  Uncharted UOP.  Weight down from 340 to 333.  IV Lasix 40mg  Daily currently.  Stable  SCr. Supposed to take 20mg  PO lasix daily at home.   Resume this.  4. CKD: Stable creatinine.  5. SVT: Resolved via EMS with adenosine. He has had in the past. Not on beta blocker or CCB at home. Now on Coreg 6.25 BID.   6. Respiratory distress/Suspect OSA with hypercapnea: Appreciate Pulm consult team assistance.   7. Non-ischemic cardiomyopathy: Medical management.   8.  Of for DC home today.  Follow up with cardiologist, Dr. [name], in [place].  He will need a sleep study.     LOS: 4 days    HAGER, BRYAN PA-C 12/13/2014 10:29 AM  Patient is seen.  He has had no further chest pain or SVT.  He thinks that he had a sleep study many years ago but certainly needs an update.  He will arrange this through his PCP and his cardiologist in [place].  okay for discharge from cardiac standpoint.

## 2014-12-13 NOTE — Progress Notes (Signed)
Inpatient Diabetes Program Recommendations  AACE/ADA: New Consensus Statement on Inpatient Glycemic Control (2013)  Target Ranges:  Prepandial:   less than 140 mg/dL      Peak postprandial:   less than 180 mg/dL (1-2 hours)      Critically ill patients:  140 - 180 mg/dL  Results for BEHR, CISLO (MRN [id number]) as of 12/13/2014 09:07  Ref. Range 12/11/2014 02:50  Hemoglobin A1C Latest Ref Range: 4.8-5.6 % 8.0 (H)    Diabetes history: None listed  Inpatient Diabetes Program Recommendations  HgbA1C: Patient's A1c is 8%. Per ADA this meets criteria for a new diagnosis of DM. If patient is newley diagnosed, MD please place order for DM consult and Dietitian consult. Patient may need to be placed on oral medication on discharge.  Thanks,  Tama Headings RN, MSN, [facility] Inpatient Diabetes Coordinator Team Pager [phone]

## 2014-12-13 NOTE — Progress Notes (Signed)
Heart Failure Navigator Consult Note  Presentation: Oscar [name] is a 39 yo man with PMH of hypertension on lisinopril and furosemide, pancreatitis and prior episode of SVT per history at [facility] with similar presentation and cardiac catheterization per history revealing borderline obstructive disease without PCI/intervention and he was supposed to follow-up but did not who presents with chest pain and shortness of breath. In the ambulance he was noted to be in SVT and code STEMI activated given ST elevation per their read. In the [facility] ER, after he had received 6 mg and 12 mg of adenosine and converted to sinus rhythm, pain much improved and ST elevation noted but in setting of significant LVH so code STEMI cancelled. Heparin gtt started for presumed ACS and troponin 0.13 resulted. He also received aspirin. He tells me he is now pain free after low dose NTG gtt started. He's noticed some swelling in his abdomen and legs at home over the last few weeks. He is compliant with his medications. 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 He continues to smoke ~ 1/2 ppd. He snores at night and has been told he stops breathing.   Past Medical History  Diagnosis Date  . Hypertension   . GERD (gastroesophageal reflux disease)     History   Social History  . Marital Status: Single    Spouse Name: N/A  . Number of Children: N/A  . Years of Education: N/A   Social History Main Topics  . Smoking status: Current Every Day Smoker -- 0.50 packs/day    Types: Cigarettes  . Smokeless tobacco: Not on file  . Alcohol Use: No  . Drug Use: No  . Sexual Activity: Yes   Other Topics Concern  . None   Social History Narrative    ECHO:Study Conclusions--12/10/14  - Left ventricle: Diffuse hypokinesis worse in the inferior wall The cavity size was severely dilated. There was moderate concentric hypertrophy. The estimated ejection fraction was 25%. - Mitral valve: Calcified annulus. Mildly  thickened leaflets . There was mild regurgitation. - Left atrium: The atrium was moderately dilated.  Transthoracic echocardiography. M-mode, complete 2D, spectral Doppler, and color Doppler. Birthdate: Patient birthdate: 31-Dec-1975. Age: Patient is 39 yr old. Sex: Gender: male. BMI: 58.4 kg/m^2. Blood pressure:   100/65 Patient status: Inpatient. Study date: Study date: 12/10/2014. Study time: 03:41 PM. Location: ICU/CCU  BNP    Component Value Date/Time   BNP 60.8 12/10/2014 0007    ProBNP No results found for: PROBNP   Education Assessment and Provision:  Detailed education and instructions provided on heart failure disease management including the following:  Signs and symptoms of Heart Failure When to call the physician Importance of daily weights Low sodium diet Fluid restriction Medication management Anticipated future follow-up appointments  Patient education given on each of the above topics.  Patient acknowledges understanding and acceptance of all instructions.  I spoke briefly with patient regarding HF.  He tells me that he has had HF for "some time".  He has received education from cardiac rehab prior to my visit and he is able to teach back all topics listed above.  He admits that he frequently forgets his medications and only takes them once a day and says he thinks he was supposed to be taking twice a day.  He also admits that he is frequently very thirsty and "drinks a lot of fluid".  I reinforced a 2 L per day fluid restriction.  I also reviewed  a low sodium diet and high sodium foods to avoid.  He acknowledges understanding and is going to "try to do better".  He lives in [place] alone.  Per physician notes he will follow up with a [facility] in [place].  Education Materials:  "Living Better With Heart Failure" Booklet, Daily Weight Tracker Tool    High Risk Criteria for Readmission and/or Poor Patient Outcomes:   EF <30%-  Yes-25%  2 or more admissions in 6 months- No  Difficult social situation- No-lives alone  Demonstrates medication noncompliance- ?Yes    Barriers of Care:  Knowledge and compliance  Discharge Planning:   Plans to return home alone

## 2014-12-22 NOTE — Discharge Summary (Signed)
PATIENT NAME:  Oscar [name], Oscar [name] MR#:  [id number] DATE OF BIRTH:  [birth year]  DATE OF ADMISSION:  12/19/2012 DATE OF DISCHARGE:  12/20/2012  PRIMARY CARE PHYSICIAN: [facility]    FINAL DIAGNOSES: 1.  Acute pancreatitis.  2.  Nonischemic cardiomyopathy with chronic systolic congestive heart failure and low ejection fraction.  3.  Sleep apnea and obesity.  4.  Tobacco abuse.  5.  Elevated troponin. Likely from the malignant hypertension on presentation.  6.  Malignant hypertension.   MEDICATIONS ON DISCHARGE: Include aspirin 81 mg daily,  Klor-Con 20 mEq extended-release daily, Lasix 20 mg daily, lisinopril 10 mg daily, carvedilol 25 mg twice a day, nicotine patch 21 mg per 24-hour chest wall film daily.   FOLLOW-UP:  In 1 to 2 weeks [facility].   ACTIVITY: As tolerated.   DIET:  Low sodium, bland diet.   HOSPITAL COURSE:   The patient was admitted 12/19/2012 and discharged 12/20/2012, came in with abdominal pain. This is a 39 year old man with ejection fraction 25%, came in with abdominal pain, right upper quadrant, 10 out of 10 in intensity, no radiation. He was found to have an elevated lipase. The patient does drink some alcohol. He was admitted with acute pancreatitis, given IV fluid hydration.   LABORATORY AND RADIOLOGICAL DATA DURING HOSPITAL COURSE:  Included EKG showed normal sinus rhythm, left atrial enlargement, left ventricular hypertrophy.   LDL 168, HDL 31, triglycerides 151. BNP 168, lipase 2864.   Chest x-ray showed no acute cardiopulmonary disease. Mild cardiomegaly.   Troponin borderline at 0.19. Glucose 182, BUN 18, creatinine 0.85, sodium 139, potassium 3.6, chloride 104, CO2 30, calcium 8.6.   Liver function tests: AST slightly elevated at 39, albumin slightly low at 33.2, other liver function tests normal.   White blood cell count 6.1, hemoglobin and hematocrit 14.9 and 42.4, platelet count 187.   Ultrasound of the abdomen showed prominent pancreatic  head and body. No evidence of pancreatic or biliary ductal dilation and large nodes in the peripancreatic and portal or hepatic region, can correlate for infectious or malignant etiologies. No evidence of cholelithiasis. Gallbladder is decompressed without definite stones. There is a negative sonographic minimal Murphy's sign reported.   Troponin borderline at 0.14, next troponin borderline at 0.12, repeat lipase down to 501.   HOSPITAL COURSE PER PROBLEM LIST:  1.  For the patient's acute pancreatitis. The patient was admitted 12/19/2012. He was feeling much better on 12/20/2012. I advanced him to liquid diet at that point in time. He grabbed in the afternoon and said "I have to get out here at this time." I had him eat a sandwich and since he was having no further pain, he was discharged home on no pain medications. I believe alcohol as the cause of his pancreatitis. He was advised no alcohol drinking. Lisinopril can also be a culprit, so I decreased the dose down to 10 mg. Can consider a CT scan of the abdomen and pelvis as an outpatient, since the patient did have some lymph nodes in the abdomen.  2.  For his nonischemic cardiomyopathy, chronic systolic congestive heart failure. He was not in heart failure during the hospital stay. His Lasix was held and he was given IV fluids. I did give the patient discharge instructions with regards to heart failure because of his history. He was started on Coreg. His lisinopril dose was decreased secondary to being a possible cause of pancreatitis and he is on aspirin and oral Lasix on  discharge:  3.  For her sleep apnea and obesity. He can consider a sleep study as outpatient, must lose weight.  4.  For his tobacco abuse. Smoking cessation counseling done 3 minutes by me. Nicotine patch applied.  5.  For his elevated troponin. Likely secondary to malignant hypertension or chronic systolic congestive heart failure. He is not having chest pain. This is not a  myocardial infarction. I repeat this is not a myocardial infarction.  6.  Malignant hypertension. Blood pressure elevated on presentation and likely secondary to pain. The patient's blood pressure upon discharge was 139/95, was as low as 127/83 prior.   Close clinical follow-up as outpatient needed.    ____________________________ Tana Conch. Leslye Peer, MD rjw:cc D: 12/21/2012 17:13:48 ET T: 12/21/2012 18:49:30 ET JOB#: [id number]  cc: Tana Conch. Leslye Peer, MD, <Dictator> [facility] MD ELECTRONICALLY SIGNED 12/22/2012 20:16

## 2014-12-22 NOTE — H&P (Signed)
PATIENT NAME:  Oscar [name], Oscar [name] MR#:  [id number] DATE OF BIRTH:  May 27, 1976  DATE OF ADMISSION:  12/19/2012  PRIMARY CARE PHYSICIAN: [facility].   REFERRING PHYSICIAN: Dr. Renard Hamper.   CHIEF COMPLAINT: Abdominal pain.   HISTORY OF PRESENT ILLNESS: The patient is an unfortunate 39 year old African American male with a history of malignant hypertension, morbid obesity, congestive heart failure with EF of 25%, presented to the Emergency Department with complaints of abdominal pain of 3 to 4 days' duration. The patient states the pain is in the right upper quadrant area,  10/10 intensity, no radiation, and stabbing in nature. This was associated with the nausea. No episodes of vomiting. The patient denied having any fever. Denied having any diarrhea.  this, came to the Emergency Department. The patient also has been experiencing chest pain in the mid-sternal area associated with mild shortness of breath. Workup in the Emergency Department revealed the patient had elevated lipase of 2864. The patient denied drinking daily use of alcohol, however drinks alcohol 1 to  2 times a week lasting. The last time was 3 days back. The patient states he drank 2 shots of vodka. In the Emergency Department ultrasound was done that did not show any gallstones.   In regards to chest pain cardiac enzymes were done which showed troponin of 0.19, however CK-MB was normal at 3.2. EKG does not show any ST  or T wave abnormalities other than repolarization changes.   PAST MEDICAL HISTORY:  1.  History of MI in April 2011, with heart catheterization; showed normal coronaries.  2.  Supraventricular tachycardia, with atrial flutter.  3.  Ongoing tobacco use.  4.  Morbid obesity.  5.  Noncompliance.  6.  Cardiomyopathy, with EF of 25%  7.  Possible obstructive sleep apnea.  8.  Hyperlipidemia.   ALLERGIES: No known drug allergies.   HOME MEDICATIONS:  1.  Lisinopril 20 mg 2 times a day.  2.  Lasix 20 mg daily.  3.   Klor-Con 20 mg daily.  4.  Aspirin 81 mg daily.   SOCIAL HISTORY: Continues to smoke 1 pack a day. Drinks alcohol 1 to 2 times a day. Denies using any illicit drugs. Lives by himself. Works as a Biomedical scientist.   FAMILY HISTORY: Mother died of heart disease. Father has gout.   REVIEW OF SYSTEMS: CONSTITUTIONAL: Generalized weakness. No fever.  EYES: No change in vision. No pain in the eyes.  ENT: No sore throat. No change in hearing.  RESPIRATORY:  Denies any cough. Has shortness of breath.  CARDIOVASCULAR: Complained of chest pain.  GASTROINTESTINAL: Has nausea, abdominal pain.  GENITOURINARY: Denies any dysuria or hematuria.  ENDOCRINE: No polyuria or polydipsia.  SKIN: No rash or lesions.  HEMATOLOGIC: No easy bruising or bleeding.  MUSCULOSKELETAL: Denies in any pain or swelling in the joints.  NEUROLOGIC: No numbness or weakness.  PSYCHIATRIC: No anxiety or depression.   FAMILY HISTORY: Morbidly obese male laying down in the bed, sleeping after receiving a dose of Dilaudid.   VITAL SIGNS: Temperature 98.2, pulse 89. Blood pressure 165/89, respiratory rate of 22, oxygen saturations 97% on 2 liters of oxygen.  HEENT: Head normocephalic, atraumatic. EYES: No scleral icterus. Conjunctivae normal. Pupils equal and reactive to light. Mucous membranes moist.  NECK: Supple. No lymphadenopathy. No JVD. No carotid bruit.  CHEST: Has no focal tenderness.  LUNGS: Bilaterally to auscultation.  HEART: S1, S2 regular. No murmurs are heard. No pedal edema. Pulses less in dorsalis pedis and posterior  tibialis.   ABDOMEN: Bowel sounds soft. Obese abdomen. Has tenderness in the right upper quadrant. No rebound or guarding. No CVA tenderness.   EXTREMITIES/MUSCULOSKELETAL: Has good range of motion in all the extremities.  SKIN: No rash or lesions.   NEUROLOGIC: The patient is alert, oriented to place, person and time. The cranial nerves II-XII intact. Motor 5/5 in upper and lower extremities. No sensory  deficits.  LYMPHATIC SYSTEM: No cervical, axillary, or inguinal lymphadenopathy.   LABORATORY AND DIAGNOSTIC DATA: CBC: WBC of 6.1, hemoglobin 14.9, platelet count of 187.   CMP is completely within normal limits. Troponin 0.19, CK 387, CK-MB 8.2.   Chest x-ray, portable: No acute cardiopulmonary disease.   Lipase 2800, BNP is 168.   EKG, 12-lead: Normal sinus rhythm, with nonspecific ST-T wave abnormalities, repolarization changes secondary to left ventricular hypertrophy.   ASSESSMENT AND PLAN: The patient is a 39 year old morbidly obese male who comes to the Emergency Department with complaints of right upper quadrant pain, was found to have acute pancreatitis.   1.  Acute pancreatitis: Rule out gallstones with ultrasound of the right upper quadrant. We will check a lipid profile, with possible hypertriglyceridemia. The patient also consumed alcohol three days which corresponds to the time of the onset of the right upper quadrant. The patient has a normal calcium level. I will give gentle hydration with 50 mL per hour, considering the patient's history of congestive heart failure. Continue to follow up with lipase.  2. Chest pain: The patient had a heart catheterization in 2011, showed normal coronaries. However, considering the patient's multiple risk factors I will continue to check on cardiac enzymes x 3. However, this seems to be unlikely from cardiac origin. Patient has normal CK-MB.  3.  Shortness of breath: This could be a combination of mild congestive heart failure and chronic obstructive pulmonary disease. Currently, the patient has clear lungs at this time. Will continue to follow up.  4.  Morbid obesity: Counseled with the patient regarding diet and exercise. The patient expressed understanding.  5.  Hypertension, poorly-controlled. The patient also is noncompliant with his medication. Start back on lisinopril.  6.  Congestive heart failure, with ejection fraction of 25%: The  patient is not on any beta blocker. Also considering the patient's atrial flutter start the patient on Coreg 25 mg b.i.d.  7.  Keep the patient on deep vein thrombosis prophylaxis with Lovenox.   Time spent: Fifty-five minutes.    ____________________________ Monica Becton, MD pv:dm D: 12/19/2012 08:04:23 ET T: 12/19/2012 08:32:24 ET JOB#: [id number]  cc: Monica Becton, MD, <Dictator> Monica Becton MD ELECTRONICALLY SIGNED 12/22/2012 8:34

## 2014-12-23 NOTE — H&P (Signed)
PATIENT NAME:  Oscar [name], Oscar [name] MR#:  [id number] DATE OF BIRTH:  1975/12/01  DATE OF ADMISSION:  08/10/2014  PRIMARY CARE PHYSICIAN:  Oscar Lou. Shepanski, NP  REFERRING PHYSICIAN:   Eryka A. Edd Fabian, MD  CHIEF COMPLAINT:  Abdominal pain.  HISTORY OF PRESENT ILLNESS:  Oscar [name] is a 39 year old morbidly-obese male with complaints of abdominal pain in the epigastric area. The patient has had multiple episodes of abdominal pain and acute pancreatitis. The patient's last admission was in August 2015. The patient denies drinking any alcohol. The patient had previous workup done with ultrasound, which did not show any gallstones. The patient states the pain is in the epigastric area and 10/10 in intensity. Workup in the Emergency Department:  The patient's lipase was elevated at 4000.  PAST MEDICAL HISTORY:   1.  History of coronary artery disease status post MI with stent placement in 2011.   2.  History of supraventricular tachycardia. 3.  Ongoing tobacco abuse. 4.  Morbid obesity. 5.  Noncompliance.  6.  Sleep apnea. 7.  Hyperlipidemia. 8.  Hypertension.  ALLERGIES:  No known drug allergies.  HOME MEDICATIONS:   1.  Prevacid 30 mg daily. 2.  Lisinopril 20 mg once a day. 3.  Coreg 25 mg once a day. 4.  Aspirin 81 mg daily.  SOCIAL HISTORY:  Continues to smoke 1 pack a day. Denies drinking alcohol or using illicit drugs. Lives by himself.   FAMILY HISTORY:  History of hypertension and diabetes mellitus.  REVIEW OF SYSTEMS: CONSTITUTIONAL:  Experiencing generalized weakness. EYES:  No change in vision. EARS, NOSE, AND THROAT:  No change in hearing. RESPIRATORY:  No cough or shortness of breath. CARDIOVASCULAR:  No chest pain or palpitations. GASTROINTESTINAL:  Has abdominal pain in the epigastric area. No hepatosplenomegaly. SKIN:  No rash or lesions. MUSCULOSKELETAL:  Good range of motion in all of the extremities. No joint pains and aches.   LABORATORY AND RADIOGRAPHIC DATA:   Lipase is 4300. No elevated LFTs. CMP:  Elevated alkaline phosphatase of 153, AST 42, ALT 41. Ultrasound of the right upper quadrant is negative for any cholelithiasis or acute cholecystitis. CBC is completely within normal limits. UA is negative for nitrites and leukocyte esterase.  ASSESSMENT AND PLAN:  Oscar [name] is a 39 year old male who comes to the Emergency Department with multiple episodes of acute pancreatitis.  1.  Acute pancreatitis. The patient denies drinking any alcohol. We will obtain lipid profile. Also could be medication-induced from lisinopril. We will hold the lisinopril and start the patient on a different type of antihypertensive medication. Keep the patient n.p.o. Continue with IV fluids.  2.  Morbid obesity. Counseled with the patient regarding diet and exercise. 3.  Tobacco use. Counseled with the patient.  4.  Diabetes mellitus. 5.  Hypertension, currently well controlled. 6.  Keep the patient on deep vein thrombosis prophylaxis with Lovenox.  TIME SPENT:  50 minutes.   ____________________________ Monica Becton, MD pv:nb D: 08/11/2014 02:34:55 ET T: 08/11/2014 02:45:45 ET JOB#: [id number]  cc: Monica Becton, MD, <Dictator> Monica Becton MD ELECTRONICALLY SIGNED 08/11/2014 21:38

## 2014-12-23 NOTE — H&P (Signed)
PATIENT NAME:  Oscar [name], FORGET MR#:  [id number] DATE OF BIRTH:  1975-10-21  DATE OF ADMISSION:  08/21/2014  REFERRING PHYSICIAN: Gretchen Short. Beather Arbour, MD  PRIMARY CARE PHYSICIAN: [facility], Howell Shepanski, NP, trying to establish care at [facility].   CHIEF COMPLAINT: Abdominal pain.   HISTORY OF PRESENT ILLNESS: This is a 39 year old African American gentleman with history of nonischemic cardiomyopathy, ejection fraction of 25%, which was diagnosed back in 2013; hypertension, essential; hyperlipidemia, unspecified; obstructive sleep apnea, presenting with abdominal pain. Recent discharge from [place] on 08/11/2014 with discharge diagnosis of acute pancreatitis. He said is doing fairly well since discharge; however, his abdominal pain never completely resolved. Today, he ate a large meal and developed acute abdominal pain, periumbilical epigastric in location, sharp in quality, 10 out of 10 in intensity, nonradiating, worse with p.o. intake, no relieving factors,  had associated constipation over the last few days as well. Workup in the Emergency Department revealed worsening lipase from his discharge, currently 9295.   REVIEW OF SYSTEMS:  CONSTITUTIONAL: Denies any fevers. Positive for fatigue, weakness. EYES: Denies blurred vision, double vision, eye pain. EARS, NOSE, THROAT: Denies tinnitus, ear pain, hearing loss. RESPIRATORY: Denies cough, wheeze, shortness of breath.  CARDIOVASCULAR: Denies chest pain, palpitations, edema.  GASTROINTESTINAL: Positive for constipation and abdominal pain as described above. Denies any nausea, vomiting, diarrhea.  GENITOURINARY: Denies dysuria or hematuria. ENDOCRINE: Denies nocturia or thyroid problems. HEMATOLOGIC AND LYMPHATIC: Denies easy bruising, bleeding.  SKIN: Denies rash or lesions.  MUSCULOSKELETAL: Denies pain in neck, back, shoulder, knees, hips or arthritic symptoms  NEUROLOGIC: Denies any paralysis or paresthesias.  PSYCHIATRIC: Denies anxiety  or depressive symptoms.   Otherwise, full review of systems performed by me is negative.   PAST MEDICAL HISTORY: Essential hypertension, gastroesophageal reflux disease,  without esophagitis, coronary artery disease status post PCI and stent placement, hyperlipidemia, obstructive sleep apnea, history of nonischemic cardiomyopathy, ejection fraction of 25% on last echo in 2013.   SOCIAL HISTORY: Everyday tobacco use. Denies any alcohol or drug use.   FAMILY HISTORY: No known cardiovascular or pulmonary disorders.   ALLERGIES: No known drug allergies.   HOME MEDICATIONS: Include spironolactone 25 mg p.o. daily, aspirin 81 mg p.o. daily, lisinopril 40 mg p.o. b.i.d., Coreg 25 mg p.o. b.i.d., Prevacid 30 mg p.o. daily.   PHYSICAL EXAMINATION:  VITAL SIGNS: Temperature 97.5, heart rate of 74, respirations 20, blood pressure 143/86, currently systolic blood pressure in the 90s, saturating 96% on room air. Weight 151.5 kg, BMI 53.97.  GENERAL: Obese African American gentleman, currently in minimal distress given abdominal pain.  HEAD: Normocephalic, atraumatic.  EYES: Pupils equal, round, reactive to light. Extraocular muscles intact. No scleral icterus. MOUTH: Moist mucosal membranes. Dentition intact. No abscess noted.  EARS, NOSE, THROAT: Clear without exudates. No external lesions.  NECK: Supple. No thyromegaly. No nodules. No JVD.  PULMONARY: Clear to auscultation bilaterally without wheezes, rubs, or rhonchi. No use of accessory muscles. Good respiratory effort.  CHEST: Nontender to palpation.  CARDIOVASCULAR: S1, S2, regular rate and rhythm. No murmurs, rubs, or gallops. No edema. Pedal pulses 2+ bilaterally.  GASTROINTESTINAL: Soft. Minimal tenderness in the epigastric region. Nondistended, obese. There is an umbilical hernia which is reducible. Positive bowel sounds. No appreciable hepatosplenomegaly.  MUSCULOSKELETAL: No swelling, clubbing, or edema. Range of motion full in all  extremities.  NEUROLOGIC: Cranial nerves II through XII though intact. No gross focal neurological deficits. Sensation intact. Reflexes intact.  SKIN: No ulceration, lesions, rashes, or cyanosis.  Skin warm and dry. Turgor intact.  PSYCHIATRIC: Mood and affect within normal limits. The patient is awake, alert, oriented x 3. Insight and judgment intact.   LABORATORY DATA: Sodium 136, potassium 4.8, chloride 100, bicarbonate 29, BUN 21, creatinine 1.02, glucose 138, lipase 9295. LFTs: Protein of 8.7, albumin 3.3, bilirubin 0.4, alkaline phosphatase 262, AST 133, ALT 136. Troponin 0.11. WBC of 4.8, hemoglobin 16.5, platelets of 192,000. X-rays of the abdomen and chest performed: No acute abdominal process. No acute cardiopulmonary process.   ASSESSMENT AND PLAN: A 39 year old African American gentleman with a history of nonischemic cardiomyopathy, ejection fraction of 25%, hypertension, hyperlipidemia, obstructive sleep apnea, complaining of abdominal pain. Recent discharge from [place] on 08/11/2014 with discharge diagnosis of acute pancreatitis.  1.  Recurrence of pancreatitis: Intravenous fluid hydration and pain medications as required. Add bowel regimen and will monitor his respiratory status as he is getting intravenous fluids and has a low ejection fraction.  2.  Elevated troponin, which is chronic in nature: Place on telemetry. Trend cardiac enzymes x 3.  3.  Transaminitis with recent right upper quadrant ultrasound within normal limits: Suspect this is related to solely to pancreatitis; however, if no improvement, we get a CT abdomen and pelvis to look at further etiologies.  4.  Hypertension: He currently is hypotensive after receiving some pain medications. We will hold his angiotensin-converting enzyme inhibitor and Aldactone  for now. Readjust blood pressure medications as required.  5.  Obstructive sleep apnea: Give him continuous positive airway pressure for nighttime use.  6.  Venous  thromboembolism prophylaxis with heparin subcutaneous.   CODE STATUS: The patient is FULL CODE.   TIME SPENT: 45 minutes.    ____________________________ Aaron Mose. Govani Radloff, MD dkh:ts D: 08/21/2014 03:07:40 ET T: 08/21/2014 03:37:28 ET JOB#: [id number]  cc: Aaron Mose. Stuart Mirabile, MD, <Dictator> Rhylen Pulido Woodfin Ganja MD ELECTRONICALLY SIGNED 08/21/2014 20:34

## 2014-12-23 NOTE — Discharge Summary (Signed)
PATIENT NAME:  Oscar [name], ADERMAN MR#:  [id number] DATE OF BIRTH:  14-Jul-1976  DATE OF ADMISSION:  04/18/2014 DATE OF DISCHARGE:  04/19/2014  DISCHARGE DIAGNOSES: 1. Acute alcoholic pancreatitis.  2. Chronic systolic congestive heart failure.  3. Nonischemic cardiomyopathy with ejection fraction of 25%.  4. Tobacco abuse.  5. Alcohol abuse.  6. Morbid obesity.  7. Hyperlipidemia.  8. Chronic elevation of troponin.   CONSULTS: Manya Silvas, MD, with GI.   IMAGING STUDIES: Include a CT scan of the abdomen and pelvis which showed nothing acute.   Ultrasound Doppler right upper quadrant, showed no gallstones, normal gallbladder CBD.   ADMITTING HISTORY AND PHYSICAL: Please see detailed H and P dictated by Dr. Posey Pronto. In brief, a 39 year old morbidly obese African American male patient with history of chronic systolic CHF, nonischemic cardiomyopathy with an EF of 25% presented to the hospital complaining of abdominal pain. The patient was found to have elevated lipase into the 4000s and was admitted to the hospitalist service.   HOSPITAL COURSE: 1. Acute alcoholic pancreatitis: The patient has history of alcoholic pancreatitis and continues to drink alcohol and had elevated lipase, presented to the ER with abdominal pain, admitted to the hospitalist service, started on IV fluids. By day of discharge his lipase trended down to 1600. CT scan of the abdomen and right upper quadrant ultrasound were normal. No gallstones. No problems with the gallbladder. Was seen by GI, thought to be likely from alcohol or passing gallstone. His numbers are improving. The patient has eaten food and tolerated it well. Has requested to be discharged home and was discharged with pain medication to follow up with his primary care physician. He has been requested to return to the Emergency Room if there is any worsening.  2. Nonischemic cardiomyopathy 25%: No signs of heart failure at this point in spite of IV fluids,  and his medications are being continued from home.   Prior to discharge, the patient has very minimal tenderness in the epigastric area. Lungs sound clear. Heart sounds S1, S2 with a systolic murmur, and no edema.   The patient was suspected to have a UTI initially with some white blood cells, but no bacteria, afebrile. No dysuria, and antibiotics have been stopped. His cultures have shown contamination, mixed cultures.   DISCHARGE MEDICATIONS: 1. Coreg 25 mg orally 2 times a day.  2. Lisinopril 20 mg orally 2 times a day.  3. Aspirin 81 mg daily.  4. Acetaminophen/oxycodone 325/5, 1 tablet every 6 hours as needed for pain.  DISCHARGE INSTRUCTIONS:  Low-sodium, low-fat diet. Activity as tolerated. Follow up with primary care physician in 1 to 2 weeks.   Time spent on discharge and discharge activity was 35 minutes.    ____________________________ Leia Alf Samarie Pinder, MD srs:NT D: 04/19/2014 14:22:59 ET T: 04/19/2014 16:32:33 ET JOB#: [id number]  cc: Alveta Heimlich R. Reichen Hutzler, MD, <Dictator> Earlie Lou. Keitha Butte, NP Neita Carp MD ELECTRONICALLY SIGNED 05/16/2014 16:27

## 2014-12-23 NOTE — Consult Note (Signed)
PATIENT NAME:  Oscar [name], Oscar [name] MR#:  [id number] DATE OF BIRTH:  1976-05-07  DATE OF CONSULTATION:  04/19/2014  CONSULTING PHYSICIAN:  Manya Silvas, MD.  REASON FOR ADMISSION: The patient is a 39 year old black male with a history of nonischemic cardiomyopathy with ejection fraction 25%. Hypertension, morbid obesity, previous pancreatitis 1 year ago. He had onset of abdominal pain Friday, which is 5 days ago, he was seen in the ER and ultrasound was negative. He was discharged from the ER with his lipase normal at that time. He tried to eat after that and vomited every time he tried to eat. There was no blood in the vomitus, no coffee grounds, and he was also constipated and took Ex-Lax.  He had loose stools after that.  He says his abdomen was sore but he is now 75% better.   The patient is evasive about alcohol and grossly under reports his drinking activities. He told the ER doctor that and he had not drunk in 3 months, told me he had a fifth of alcohol 3 weeks ago, but he normally drinks 1-2 drinks a day, as well as beer, but he does have spells where he drinks more heavily for a period of time.   PAST MEDICAL HISTORY: Myocardial infarction in 2011, normal coronaries, a 25% ejection fraction due to nonischemic cardiomyopathy, history of supraventricular tachycardia with atrial flutter, morbid obesity, alcohol abuse, possible sleep apnea, hyperlipidemia, and previous pancreatitis.   MEDICATIONS: Lisinopril 20 mg a day, carvedilol 25 mg b.i.d.   ALLERGIES: None.   HABITS: Alcohol, sporadic; sometimes 2 drinks a day, sometimes 1 fifth per day. Does smoke 0.5 pack a day.   FAMILY HISTORY: Mother died of heart disease. He denies any rectal bleeding. No melena. No hematemesis. He denies any fever. No asthma or wheezing. No current chest pains but his abdomen hurt all over. No dysuria or hematuria.   PHYSICAL EXAMINATION:  GENERAL: Obese black male in no acute distress says he is feeling a lot  better than on admission.  VITAL SIGNS: Blood pressure 160/100, temperature 97.5, pulse 70, respirations 18.  HEENT: Sclerae anicteric. Conjunctivae negative. Tongue is negative.  HEAD: Atraumatic.  CHEST: Clear in the anterior fields.  HEART: No murmurs or gallops that I can hear.  ABDOMEN: Obese. Minimal tenderness in the epigastric area. Bowel sounds are present. He does have a periumbilical hernia, nontender.  RECTAL: Exam not done at this time.  SKIN: Warm and dry.  PSYCHIATRIC: Mood and affect are appropriate.   LABORATORY DATA:  He has 3+ leukocyte esterase with 156 white cells per high-powered field.   White count 5.5, hemoglobin 15.5, platelet count 163,000, TSH 0.91, troponin highest 0.11 on August 14th, 0.08 yesterday; protein 8, albumin 3.1, total bilirubin 1.1, alkaline phosphatase 185, SGOT 91, SGPT 168, lipase yesterday was 4866, today is 1683; cholesterol 220, triglycerides 141, calcium 7.9, glucose 79, BUN 14, creatinine 0.84, sodium 141, potassium 3.7, chloride 101, CO2 30.   CAT scan of the abdomen done with contrast yesterday reveals a periumbilical hernia. There is enlargement of the left ventricle with left ventricular hypertrophy, similar to previous examination. Trace amount of free fluid in the pelvis. There is a normal appearance of the pancreas, spleen, adrenal glands and kidneys.   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 Cannot rule out the possibility of passage of small stones. He had an ultrasound of the abdomen done 1 week ago that showed no gallstones or wall thickening. Certainly possible to form stones and  pass them and have an acute pancreatitis and then be improved afterwards.   ASSESSMENT:  1.  Acute pancreatitis with improvement with fall in lipase and improvement in abdominal pain.  2.  Cardiomyopathy with decreased ejection fraction reported to be 25%.  3.  Pyuria, etiology unknown; possible prostate, possible bladder infection.  4.   Alcohol abuse.  5.  Cigarette abuse.  6.  Hypertension.   I will follow with you.  Recommend we await urine culture.  The patient received a dose of Rocephin yesterday. He has also been placed on subcutaneous Lovenox to prevent clotting.   ____________________________ Manya Silvas, MD rte:lt D: 04/19/2014 07:42:00 ET T: 04/19/2014 08:25:59 ET JOB#: [id number]  cc:  Manya Silvas, MD, <Dictator> [facility] Manya Silvas MD ELECTRONICALLY SIGNED 04/30/2014 12:44

## 2014-12-23 NOTE — H&P (Signed)
PATIENT NAME:  Oscar [name], Oscar [name] MR#:  [id number] DATE OF BIRTH:  [birth year]  DATE OF ADMISSION:  04/18/2014  PRIMARY CARE PROVIDER: [facility].   EMERGENCY DEPARTMENT REFERRING PHYSICIAN: Dr. Jacqualine Code.   CHIEF COMPLAINT: Abdominal pain.   HISTORY OF PRESENT ILLNESS: The patient is a 39 year old African American male with history of nonischemic cardiomyopathy, hypertension, morbid obesity, likely sleep apnea, who presented to the ED with complaint of having abdominal pain, sharp in nature, in the epigastric region with radiation to the back. The patient was actually seen in the Emergency Room on August 14th. At that time he was evaluated and had an ultrasound of the abdomen which showed negative for gallstones or any other abnormality. He returns back complaining of worsening abdominal pain, unable to eat, along with nausea. Today the patient is noted to have elevated lipase in the 4000 range. His lipase on August 14th was 195. His LFTs are also elevated. The patient otherwise reports no diarrhea, denies any urinary frequency, urgency. No fevers or chills. He reports that every time he eats he gets nauseous and food comes out and then he starts having substernal pressure related to indigestion. He used to drink in the past but reports he had not had a drink in 3 months. Even before that he was drinking only 1-2 drinks.   PAST MEDICAL HISTORY:  1.  History of myocardial infarction in 2011 with cardiac catheterization showing normal coronary arteries, has ejection fraction of 25% due to nonischemic cardiomyopathy.  2.  History of supraventricular tachycardia with atrial flutter.  3.  Ongoing tobacco abuse.  4.  Morbid obesity.  5.  Noncompliance.  6.  Likely sleep apnea.  7.  Hyperlipidemia.   ALLERGIES: None.   HOME MEDICATIONS: He is on lisinopril 20 mg daily, carvedilol 25 mg 1 tab p.o. b.i.d.   SOCIAL HISTORY: Continues to smoke, less than 0.5 pack per day, denies any alcohol use. No  drug use.   FAMILY HISTORY: Mother died of heart disease. Father has gout.   REVIEW OF SYSTEMS: CONSTITUTIONAL: Denies any fevers, fatigue, or weakness. No weight loss or weight gain.  EYES: No blurred or double vision. No pain. No redness. No inflammation. No glaucoma.  ENT: No tinnitus. No ear pain. No hearing loss. No seasonal or year-round allergies. No epistaxis. No nasal discharge. No difficulty swallowing.  RESPIRATORY: Denies any cough, wheezing, hemoptysis. No dyspnea. No asthma. No painful respiration. No chronic obstructive pulmonary disease.  CARDIOVASCULAR: Denies any chest pain, orthopnea, edema, or arrhythmia.  GASTROINTESTINAL: Denies any nausea, vomiting or diarrhea. Complains of abdominal pain. No hematemesis. No melena. No ulcer, no GERD, no irritable bowel syndrome. No jaundice.  GENITOURINARY: Denies any dysuria, hematuria, renal calculus or frequency.  ENDOCRINE: Denies any polyuria, nocturia or thyroid problems.  HEMATOLOGIC AND LYMPHATIC: Denies anemia, easy bruisability or bleeding.  SKIN: No acne. No rash. No changes in mole, hair or skin.  MUSCULOSKELETAL: Denies any pain in the neck, back or shoulder.  NEUROLOGIC: No numbness, CVA, transient ischemic attack or seizures.  PSYCHIATRIC: No anxiety, insomnia, or ADD.   PHYSICAL EXAMINATION:  VITAL SIGNS: Temperature 98.5, pulse 58, respirations 14, blood pressure 171/95, O2 saturation of 95%.  GENERAL: The patient is a well-developed, obese male in no acute distress.  HEENT: Head atraumatic, normocephalic. Pupils equally round, reactive to light and accommodation. No conjunctival pallor. No scleral icterus. Extraocular movements intact. Nasal exam shows no drainage or lesions. Ears: No drainage or external lesions. Mouth has  no lesions or exudate.  NECK: Supple. No masses. Thyroid midline not enlarged. No JVD.  RESPIRATORY: Good respiratory effort, clear to auscultation bilaterally without any rales, rhonchi, wheezing.   CARDIOVASCULAR: Regular rate and rhythm. No murmurs, rubs, clicks, or gallops. PMI is not displaced.  ABDOMEN: There is mild epigastric tenderness. No rebound. No guarding. No hepatosplenomegaly.  GENITOURINARY: Deferred.  MUSCULOSKELETAL: There is no erythema or swelling.  SKIN: No rash.  LYMPHATICS: No lymph nodes palpable.  VASCULAR: Good DP, PT pulses.  PSYCHIATRIC: Not anxious or depressed.  NEUROLOGIC: Awake, alert, oriented x 3. PSYCHIATRIC: Not anxious or depressed.   LABORATORY DATA: Glucose 102, BUN 17, creatinine 1.0, sodium 140, potassium 3.6, chloride 101, CO2 of 29, lipase 4866, total protein 8.3, albumin 3.3, bilirubin total 1.6, bilirubin direct 0.1, alkaline phosphatase 202, AST 130, ALT 219, troponin less than 0.08, WBCs 5.1, hemoglobin 15.4, platelet count 178.  CT scan of the abdomen and pelvis without contrast showed trace amount of free fluid in the pelvis, finding, nonspecific. Periumbilical hernia containing fat, prominent left ventricle. Prominent lymph nodes in the upper abdomen.   ASSESSMENT AND PLAN: The patient is a 39 year old African American male with history of nonischemic cardiomyopathy, history of alcohol use in the past, none now, who presents with abdominal pain, noted to have acute pancreatitis.  1.  Acute pancreatitis, etiology unclear, in light of the patient not drinking. His ultrasound of the abdomen done a few days ago was negative. His CT scan is nonrevealing. His LFTs are abnormal.  I will have GI come evaluate the patient for further recommendations.  Will keep him n.p.o., give him IV fluids. Pain control.  2.  History of nonischemic cardiomyopathy. We will monitor his IV fluid intake. Monitor for any evidence of congestive heart failure.  3.  Hypertension. Hold lisinopril and carvedilol. I will put him on p.r.n. hydralazine.  4.  Nicotine addiction. Smoking cessation done, 4 minutes spent. Recommend the patient to stop smoking. He will be started on  a nicotine patch.   TIME SPENT: 60 minutes on this patient.    ____________________________ Lafonda Mosses. Posey Pronto, MD shp:lt D: 04/18/2014 15:18:39 ET T: 04/18/2014 15:39:24 ET JOB#: [id number]  cc: Jashley Yellin H. Posey Pronto, MD, <Dictator> Alric Seton MD ELECTRONICALLY SIGNED 04/23/2014 13:53

## 2014-12-23 NOTE — Discharge Summary (Signed)
PATIENT NAME:  Oscar [name], Oscar [name] MR#:  [id number] DATE OF BIRTH:  [birth year]  DATE OF ADMISSION:  08/21/2014 DATE OF DISCHARGE:  08/22/2014  For a detailed note, please take a look at the history and physical done on admission by Dr. Valentino Nose.   DIAGNOSES AT DISCHARGE: As follows: Acute on chronic pancreatitis ischemic, cardiomyopathy, ejection fraction of 25%, hypertension, hyperlipidemia, obstructive sleep apnea.   CODE STATUS: The patient is being discharged on a low-sodium, low-fat diet.   ACTIVITY: As tolerated. Follow-up at the [facility].   DISCHARGE MEDICATIONS: Are as follows: Omeprazole 20 mg daily, lisinopril 20 mg daily; potassium 20 mEq daily, promethazine 25 mg 1-2 tabs every 4-6 hours as needed, Tylenol with oxycodone 5/325 one to 2 tabs q. 4-6 hours as needed, Coreg 25 mg b.i.d.   PERTINENT STUDIES DONE DURING THE HOSPITAL COURSE: As follows: A lipase level noted to be as high as 9200. An abdominal 3-way done showing negative abdominal radiograph, no acute cardiopulmonary disease.   BRIEF HOSPITAL COURSE: This is a 39 year old male who presented to the hospital with abdominal pain and nausea, and noted to have elevated lipase consistent with acute pancreatitis.   1.  Acute on chronic pancreatitis. The patient has had previous history of pancreatitis. The patient was admitted to the hospital, started on supportive care with IV fluids, antiemetics, pain control. He was kept n.p.o. The exact etiology of his pancreatitis is unclear. He denies any alcohol abuse. He was recently admitted for similar reasons. Had a right upper quadrant ultrasound, which was essentially benign. His LFTs actually improved. His triglycerides levels were normal. After supportive care, the patient's lipase level did come down to as low as 400. He was started on a liquid diet, eventually advanced to a low-fat diet, which he is currently tolerating without any evidence of further abdominal pain, nausea,  or vomiting: and therefore, is being discharged home on oral pain meds and antiemetics as mentioned.  2.  Elevated troponin. This was likely in the setting of demand ischemia. The patient had no evidence of acute coronary syndrome. He was observed on telemetry, which was discontinued 24 hours later. He will continue his Coreg, aspirin, and lisinopril as stated.  3.  Obstructive sleep apnea. The patient was maintained on CPAP while in the hospital. He does not have a CPAP machine at home, but he plans on getting this through his primary care physician.  4.  Gastroesophageal reflux disease. The patient has maintained on his Protonix. He will resume that.   CODE STATUS: The patient is a full code.   DISPOSITION: He was discharged home.   TIME SPENT: 40 minutes    ____________________________ Belia Heman. Verdell Carmine, MD vjs:mw D: 08/22/2014 16:33:45 ET T: 08/22/2014 17:16:58 ET JOB#: [id number]  cc: Belia Heman. Verdell Carmine, MD, <Dictator> Henreitta Leber MD ELECTRONICALLY SIGNED 08/31/2014 10:46

## 2014-12-23 NOTE — Discharge Summary (Signed)
PATIENT NAME:  LENG, MONTESDEOCA MR#:  [id number] DATE OF BIRTH:  1975-10-25  DATE OF ADMISSION:  08/10/2014 DATE OF DISCHARGE:  08/11/2014  ADDENDUM: It was noted that the patient had elevated troponins on admission; however, it should be noted that the patient has chronically elevated troponins. There is no indication for acute coronary syndrome. This is at his baseline.    ____________________________ Donell Beers. Benjie Karvonen, MD spm:TT D: 08/11/2014 12:57:24 ET T: 08/11/2014 20:34:26 ET JOB#: [id number]  cc: Taylon Coole P. Benjie Karvonen, MD, <Dictator> Donell Beers Gretchen Weinfeld MD ELECTRONICALLY SIGNED 08/11/2014 21:47

## 2014-12-23 NOTE — Consult Note (Signed)
Pt consult done, he has pancreatitis, likely alcohol in origin, can't rule out passage of small stones.  He is improving with less pain and falling lipase.  He does drink more than he lets on, drank a fifth of liquor 3 weeks ago, usually 2-3 drinks a day. He has signif pyuria of uncertain cause, rocephin started, will likely need a few days of oral antibiotics.  I will follow with you.  With an EF of 25 % is he a candidate for implantation of pacemaker/defib device? like to see HCT fall after hydration in acute pancreatitis but our hydration must take in account his CHF/cardiomyopathy.  indication for EGD or colonoscopy at this time. follow with you.  Electronic Signatures: Manya Silvas (MD)  (Signed on 19-Aug-15 07:47)  Authored  Last Updated: 19-Aug-15 07:47 by Manya Silvas (MD)

## 2014-12-23 NOTE — Discharge Summary (Signed)
PATIENT NAME:  Oscar [name], Oscar [name] MR#:  [id number] DATE OF BIRTH:  [birth year]  DATE OF ADMISSION:  08/10/2014 DATE OF DISCHARGE:  08/11/2014  ADMISSION DIAGNOSIS: Acute pancreatitis.   DISCHARGE DIAGNOSES:  1.  Acute pancreatitis with a history of pancreatitis in the past.  2.  Morbid obesity.  3.  Cardiomyopathy nonischemic, ejection fraction of less than 25%, with a history of coronary artery disease and stents.  4.  Diabetes, without mention of complications.  5.  Essential hypertension.  6.  Obstructive sleep apnea, does not have a CPAP at this time.   LABORATORIES AT DISCHARGE: Triglycerides of 101, LDL of 186, lipase of 2467. White blood cells 5.8, hemoglobin 15, hematocrit 45, platelets are 166,000. Sodium 137, potassium 3.7, chloride 104, bicarbonate 25, BUN 18, creatinine 0.97, glucose is 163. AST is 35, ALT 37, alkaline phosphatase 130, bilirubin 0.2. Troponin 0.13.   Ultrasound of the abdomen showed no evidence of acute cholecystitis.   HOSPITAL COURSE: This is a 39 year old male with morbid obesity, nonischemic cardiomyopathy, history of CAD, who presents with epigastric pain, found to have acute pancreatitis. For further details, please refer to the H and P.  1.  Acute pancreatitis. The patient had several admissions in the past for acute pancreatitis without a clear etiology. Abdominal ultrasound did not show gallbladder stones. He denies any alcohol abuse. Triglyceride levels were within normal limits. He thinks, despite an elevated lipase, that his abdominal pain is better, so we tried him on a liquid diet, advanced as tolerated, and he is tolerating this diet.  2.  OSA. The patient does not have a CPAP; he says he will obtain this from [facility]. We encouraged patient to do so.  3.  History of CAD, stents and nonischemic cardiomyopathy. Last EF was less than 20%. The patient is on lisinopril. I did add an ARB, and he will continue on Coreg.  4.  Tobacco dependence. The patient was  counseled to stop smoking.  5.  Morbid obesity. Encouraged weight loss as tolerated.  6.  Malignant hypertension on essential hypertension. The patient is not compliant with his medications. I made some medication regimen changes, and he will need to follow up closely with his primary care physician.   DISCHARGE MEDICATIONS:  1.  Aspirin 81 mg daily.  2.  Prevacid 30 mg daily. 3.  Lisinopril 20 mg 2 tablets b.i.d.  4.  Coreg 25 mg b.i.d.  5.  Spironolactone 25 mg daily.  6.  Nicotine patch 21 mg per 24 hours.   DISCHARGE DIET: Low sodium.   DISCHARGE ACTIVITY: As tolerated.   DISCHARGE FOLLOWUP: The patient will need to follow up with his primary care physician for blood pressure control as well as a BMP.    TIME SPENT: Approximately 40 minutes.    ____________________________ Donell Beers. Benjie Karvonen, MD spm:MT D: 08/11/2014 12:37:01 ET T: 08/11/2014 19:29:48 ET JOB#: [id number]  cc: [address]by [address] P. Benjie Karvonen, MD, <Dictator> Earlie Lou. Keitha Butte, NP Donell Beers Zaahir Pickney MD ELECTRONICALLY SIGNED 08/11/2014 21:46

## 2014-12-24 NOTE — Discharge Summary (Signed)
PATIENT NAME:  Oscar [name], Oscar [name] MR#:  [id number] DATE OF BIRTH:  1976/08/21  DATE OF ADMISSION:  02/06/2012 DATE OF DISCHARGE:  02/07/2012  ADMITTING PHYSICIAN: Alounthith Phichith, MD  DISCHARGING PHYSICIAN: Gladstone Lighter, MD  PRIMARY CARE PHYSICIAN: None. An appointment has been scheduled with the [facility] prior to discharge. (His appointment is on 02/19/2012)  CONSULTANTS: Lujean Amel, MD - Cardiology.  DISCHARGE DIAGNOSES:  1. Elevated troponins, likely demand ischemia, not non-ST-elevation myocardial infarction.  2. Congestive heart failure with systolic dysfunction, ejection fraction 25%.  3. Nonischemic cardiomyopathy.  4. Hypertension.  5. Noncompliance with medications.  6. Chronic obstructive pulmonary disease exacerbation with bronchitis.  7. Obesity.  8. Tobacco use disorder.  9. Likely has obstructive sleep apnea and will need outpatient sleep study.  10. Elevated transaminases, liver normal on ultrasound. 11. 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   DISCHARGE HOME MEDICATIONS:  1. Aspirin 81 mg p.o. daily.  2. Magnesium oxide 400 mg p.o. daily.  3. Lisinopril 40 mg p.o. daily.  4. Coreg 25 mg p.o. b.i.d.  5. Hydralazine 25 mg p.o. b.i.d.  6. Prednisone taper.  7. Levaquin 500 mg p.o. daily for five more days.   NOTE: The patient was advised to hold the following medications: Simvastatin.  DISCHARGE HOME OXYGEN: 2 liters.   DISCHARGE DIET: Low-sodium diet.   DISCHARGE ACTIVITY: As tolerated.   FOLLOWUP INSTRUCTIONS: Primary care physician followup in 1 to 2 weeks. Liver function tests in one week.   LABS AND IMAGING STUDIES: WBC 4.8, hemoglobin 16.4, hematocrit 48.6, platelet count 204.   Sodium 137, potassium 4.5, chloride 99, bicarbonate 31, BUN 18, creatinine 0.84 glucose 146, calcium 9.1.   ALT 83, AST 47, alkaline phosphatase 176, total bilirubin 0.3, and albumin 3.4. Magnesium 1.7. INR 1.0. LDL 203, HDL 40, total  cholesterol 256, and triglycerides 65. Free T4 0.99.   Acute hepatitis panel was negative. HIV antibody test was negative.   Ultrasound of the abdomen is showing normal abdominal ultrasound. The liver is normal without evidence of focal hepatic lesion, normal echogenicity.   CT of the chest with contrast is showing no classic alveolar pneumonia. Mild increased interstitial density noted in superior segment of right lower lobe. Could be interstitial pneumonia and there are bullous changes seen in the apices. Borderline enlargement of mediastinal and hilar lymph nodes. Enlargement of cardiac chambers without evidence of pleural or pericardial effusion.  No evidence of pulmonary embolus.   Urinalysis: Negative for any infection.   Troponin on admission was 0.10 and trended down to be negative later.  BRIEF HOSPITAL COURSE: Oscar [name] a very obese 39 year old African American male with history of nonischemic cardiomyopathy with ejection fraction of 25%, hypertension, and chronic obstructive pulmonary disease who is noncompliant with his medications who presents with sudden onset of chest pain and found to have troponin elevation of 0.1.  1. Chest pain with mild troponin elevation. Possibly the chest pain was related to his chronic obstructive pulmonary disease exacerbation as he was found to be wheezing in the hospital and troponin elevation was probably because of his cardiomyopathy and congestive heart failure and it trended down. Dr. [name] from cardiology has seen the patient. Echo was with no wall motion abnormalities. So he is being discharged to home since his chest pain has resolved. He will be on medical management with aspirin, lisinopril, Coreg, and hydralazine.  2. Chronic obstructive pulmonary disease exacerbation with bronchitis and/or early pneumonia as seen on CT of the chest.  He was started on Levaquin in the hospital and finished off the course. He was on Solu-Medrol IV, which  improved his wheezing, and he is being discharged on prednisone taper.  3. Hypertension. He is on lisinopril, Coreg, and hydralazine.  4. Obesity and hypoxia. Probably has underlying sleep apnea. He was requiring 2 to 3 liters oxygen while in the hospital and is being discharged on the same. He will need outpatient follow-up with the [facility]. 5. Tobacco abuse disorder. He is strongly counseled against smoking and requested for nicotine patch in the hospital and wanted to use Nicorette gum, which is over-the-counter, as an outpatient.  6. Elevated transaminases secondary to congestive hepatomegaly on admission. That was improving by the time of discharge. Repeat liver test in one week recommended. Ultrasound of the abdomen showed normal liver echogenicity.  His course has been otherwise uneventful in the hospital.   DISCHARGE CONDITION: Stable.   DISCHARGE DISPOSITION: Home with home oxygen.   TIME SPENT ON DISCHARGE: 40 minutes.  ____________________________ Gladstone Lighter, MD rk:slb D: 02/08/2012 12:47:10 ET    T: 02/10/2012 09:55:40 ET      JOB#: [id number]  cc: Gladstone Lighter, MD, <Dictator> [facility] Gladstone Lighter MD ELECTRONICALLY SIGNED 02/10/2012 14:21

## 2014-12-24 NOTE — H&P (Signed)
PATIENT NAME:  Oscar [name], Oscar [name] MR#:  [id number] DATE OF BIRTH:  Dec 17, 1975  DATE OF ADMISSION:  02/06/2012  REFERRING PHYSICIAN: Marjean Donna, MD    PRIMARY CARE PHYSICIAN: [facility] but has not been seen for the past four months.   PRESENTING COMPLAINT: Chest pain, shortness of breath.   HISTORY OF PRESENT ILLNESS: Oscar [name] is a 39 year old unfortunate gentleman with history of malignant hypertension, cardiomyopathy, last ejection fraction of 25%, history of hypertension and probable obstructive sleep apnea, hyperlipidemia, who represents with complaints of developing acute onset of chest pain earlier this evening when he was coming out of the shower. He reports it occurring in the middle of his chest, no radiation associated. He reports it was associated with shortness of breath and diaphoresis. No nausea or vomiting. No palpitations, presyncope, or syncope. He reports that for the past one month now he has run out of his medications. He cannot provide what medications he was on. He also endorses approximately one week ago he had one episode where he had a cough that was bright red and bloody, has not had recurrence, but continues to have the cough. No fevers or chills. He does endorse abdominal pain that has been going on for approximately one week and noticing the development of a knot in his abdomen this evening.   PAST MEDICAL HISTORY:  1. Hospitalized in April 2011 for a non-ST elevation myocardial infarction. He had a cardiac catheterization at that time that showed mainly normal coronaries. He apparently had a renal angiogram to evaluate for his hypertension and it was normal in the past. He was readmitted in May of 2012 also for non-ST elevation myocardial infarction and had another catheterization by Oscar [name] that showed ejection fraction of 25% and no significant coronary disease.  2. History of supraventricular tachycardia/atrial flutter.  3. Tobacco use, ongoing.   4. Noncompliance.  5. Cardiomyopathy with ejection fraction of 25% thought to be secondary to elevated blood pressure plus/minus obstructive sleep apnea. The patient was instructed back in April of 2011 to evaluate his sleep apnea, but he has not done so.  6. Hypertension.  7. Again, questionable obstructive sleep apnea.  8. Hyperlipidemia.   ALLERGIES: No known drug allergies.   MEDICATIONS: He has been out of his medications for the past month and does not know what medicine he is on except for Lipitor. According to his Discharge Summary from May 2012, he was on:  1. Aspirin 81 mg daily.  2. Nicotine patch 14 mg daily with titration after two weeks.  3. Magnesium oxide 400 mg daily.  4. Coreg 25 mg b.i.d.  5. Lisinopril 40 mg daily.  6. Hydralazine 10 mg q.i.d.  7. Simvastatin 40 mg at bedtime.   FAMILY HISTORY: Mother died of heart disease. Father has gout.   PAST SURGICAL HISTORY: None.   SOCIAL HISTORY: He lives in [place] alone. He smokes a pack per day. No alcohol or drug use. He works as a Training and development officer at [employer].    REVIEW OF SYSTEMS: CONSTITUTIONAL: No fevers, chills, nausea, or vomiting. EYES: No visual disturbances. ENT: No epistaxis, discharge. RESPIRATORY: Endorses cough that began a week ago, productive with reports of one episode of hemoptysis. Denies any wheezing. CARDIOVASCULAR: As per history of present illness. He endorses chronic feet edema. No palpitations or syncope. GI: No nausea, vomiting, diarrhea. He endorses abdominal pain for the past one week. No hematemesis or melena. He reports developing a knot over his umbilical area. GU:  No dysuria or hematuria. ENDOCRINE: No polyuria or polydipsia. HEMATOLOGIC: No easy bleeding. SKIN: No ulcers. MUSCULOSKELETAL: No joint swelling. NEUROLOGIC: No history of stroke or seizure. PSYCHIATRIC: Denies any suicidal ideation.   PHYSICAL EXAMINATION:  VITAL SIGNS: Temperature 98.2, pulse 103, respiratory rate 28 initially,  blood pressure 196/113, repeat blood pressure 161/86, sating 94% on room air.   GENERAL: Obese, lying in bed in some mild discomfort.  HEENT: Normocephalic, atraumatic. Pupils are slightly dilated. Anicteric. Nares without discharge. He has moist mucous membranes.   NECK: Soft and supple. No adenopathy. He has elevated JVP of approximately 8 cm.   CARDIOVASCULAR: Mildly tachycardic. No murmurs, rubs, or gallops.   LUNGS: Coarse breath sounds. No use of accessory muscles or increased respiratory effort.   ABDOMEN: Soft, obese. He has mild tenderness. There is a palpable knot over his umbilical area, difficult to assess his liver size.   EXTREMITIES: Trace edema bilaterally. Dorsalis pedis pulses intact.   MUSCULOSKELETAL: No joint effusion.   SKIN: No ulcers.   NEUROLOGIC: No dysarthria or aphasia. Symmetrical strength. No focal deficits.   PSYCHIATRIC: He is alert and oriented. The patient is cooperative.   PERTINENT LABORATORY, DIAGNOSTIC AND RADIOLOGICAL DATA:  Urinalysis with specific gravity of 1.006, blood 1+, pH 5, protein 100 mg/dL, 4 WBC per high-power field.  Troponin 0.1. CK 646. MB 3.9.  WBC 6.7, hemoglobin 15.6, hematocrit 46.1, platelets 169, MCV 95. Glucose 110, BUN 12, creatinine 0.85, sodium 139, potassium 3.8, chloride 103, carbon dioxide 27, calcium 8.3. Total bilirubin 0.4, alkaline phosphatase 179, ALT 103, AST of 62, total protein 8.4.  INR 0.9. BNP 405.  Chest x-ray with pulmonary congestion and cardiomegaly. Official chest x-ray read is pending. EKG with sinus tachycardia with mildly peaked T waves. No ST elevation or depression. No Q waves.   ASSESSMENT AND PLAN: Oscar [name] is a 39 year old gentleman with history of hypertension malignant, SVT, atrial flutter, dyslipidemia, obesity, cardiomyopathy with ejection fraction of 25%, prior non-ST elevation myocardial infarction, in the setting of hypertension and cardiomyopathy, history of cardiomegaly, probable  obstructive sleep apnea, ongoing tobacco abuse and noncompliance, presenting with chest pain and shortness of breath.   1. Non-ST elevation myocardial infarction: Likely demand ischemia in the setting of accelerated hypertension, cardiomyopathy, and noncompliance. His chest x-ray, as above, is revealing for pulmonary congestion; and it appears that there is likely a trend of the patient running out of medications and presenting with similar issues as he was admitted on a yearly basis from April 2011, and then May 2012. The patient was educated on medication compliance. We will continue him on telemetry. Cycle cardiac enzymes. Start on Lovenox, aspirin. Resume his Coreg, lisinopril. We will hold simvastatin in light of his transaminitis. Obtain Cardiology consultation. We will repeat an echocardiogram. Send a TSH, fasting lipid panel, and A1c.  2. Hemoptysis x1: Questionable in the setting of congestive heart failure and hypertension uncontrolled.  His chest x-ray does show some right hilar prominence, could be fluid related. We will get CT of the chest to further evaluate. He has no recurrence with ongoing cough. We will follow his hemoglobin and hematocrit.  3. Abdominal pain and transaminitis: Likely congestive hepatopathy versus fatty infiltration. We will get a lipase level, hepatitis panel, and HIV. We will obtain an abdominal ultrasound, hold his simvastatin.  4. Hypertension, accelerated: As above, restart Coreg, lisinopril. We will restart his hydralazine.  5. Cardiomyopathy with ejection fraction of 25%: As above, Cardiology consultation, echocardiogram. Resume medications. Ins and outs,  daily weights, and start on Lasix IV.  6. Prophylaxis with Lovenox, aspirin and Protonix.   TIME SPENT: Approximately 50 minutes spent on patient care.   ____________________________ Rita Ohara, MD ap:cbb D: 02/06/2012 05:06:42 ET T: 02/06/2012 09:45:26 ET JOB#: [id number]  cc: Brien Few Darrian Goodwill,  MD, <Dictator> [facility] MD ELECTRONICALLY SIGNED 02/20/2012 0:27

## 2015-01-04 ENCOUNTER — Ambulatory Visit: Payer: MEDICAID | Attending: Ophthalmology

## 2015-01-04 DIAGNOSIS — G4733 Obstructive sleep apnea (adult) (pediatric): Secondary | ICD-10-CM | POA: Insufficient documentation

## 2015-01-04 DIAGNOSIS — I509 Heart failure, unspecified: Secondary | ICD-10-CM | POA: Insufficient documentation

## 2015-01-17 ENCOUNTER — Other Ambulatory Visit: Payer: Self-pay

## 2015-01-24 ENCOUNTER — Ambulatory Visit: Payer: Self-pay | Admitting: Internal Medicine

## 2015-02-22 ENCOUNTER — Ambulatory Visit: Payer: MEDICAID

## 2015-03-08 ENCOUNTER — Ambulatory Visit: Payer: MEDICAID | Admitting: Ophthalmology

## 2015-03-08 ENCOUNTER — Ambulatory Visit: Payer: MEDICAID

## 2015-03-13 ENCOUNTER — Ambulatory Visit: Payer: Self-pay

## 2015-03-19 ENCOUNTER — Inpatient Hospital Stay: Admission: RE | Admit: 2015-03-19 | Payer: Self-pay | Source: Ambulatory Visit

## 2015-03-29 ENCOUNTER — Encounter
Admission: RE | Admit: 2015-03-29 | Discharge: 2015-03-29 | Disposition: A | Discharge status: Home / Self Care | Payer: Self-pay | Source: Ambulatory Visit | Attending: Cardiology | Admitting: Cardiology

## 2015-03-29 DIAGNOSIS — Z01812 Encounter for preprocedural laboratory examination: Secondary | ICD-10-CM | POA: Insufficient documentation

## 2015-03-29 HISTORY — DX: Cardiomyopathy, unspecified: I42.9

## 2015-03-29 HISTORY — DX: Sleep apnea, unspecified: G47.30

## 2015-03-29 HISTORY — DX: Hypoxemia: R09.02

## 2015-03-29 HISTORY — DX: Cardiac arrhythmia, unspecified: I49.9

## 2015-03-29 HISTORY — DX: Atherosclerotic heart disease of native coronary artery without angina pectoris: I25.10

## 2015-03-29 HISTORY — DX: Failure in dosage during unspecified surgical and medical care: Y63.9

## 2015-03-29 HISTORY — DX: Heart failure, unspecified: I50.9

## 2015-03-29 HISTORY — DX: Acute myocardial infarction, unspecified: I21.9

## 2015-03-29 LAB — CBC
HEMATOCRIT: 47.7 % (ref 40.0–52.0)
Hemoglobin: 16.5 g/dL (ref 13.0–18.0)
MCH: 32.3 pg (ref 26.0–34.0)
MCHC: 34.6 g/dL (ref 32.0–36.0)
MCV: 93.3 fL (ref 80.0–100.0)
PLATELETS: 162 10*3/uL (ref 150–440)
RBC: 5.12 MIL/uL (ref 4.40–5.90)
RDW: 13.3 % (ref 11.5–14.5)
WBC: 5.5 10*3/uL (ref 3.8–10.6)

## 2015-03-29 LAB — URINALYSIS COMPLETE WITH MICROSCOPIC (ARMC ONLY)
Bacteria, UA: NONE SEEN
Bilirubin Urine: NEGATIVE
Glucose, UA: 50 mg/dL — AB
Hgb urine dipstick: NEGATIVE
Ketones, ur: NEGATIVE mg/dL
Nitrite: NEGATIVE
PH: 5 (ref 5.0–8.0)
PROTEIN: 100 mg/dL — AB
SPECIFIC GRAVITY, URINE: 1.02 (ref 1.005–1.030)

## 2015-03-29 LAB — DIFFERENTIAL
BASOS ABS: 0 10*3/uL (ref 0–0.1)
Basophils Relative: 0 %
EOS PCT: 3 %
Eosinophils Absolute: 0.1 10*3/uL (ref 0–0.7)
LYMPHS ABS: 2 10*3/uL (ref 1.0–3.6)
LYMPHS PCT: 37 %
Monocytes Absolute: 0.3 10*3/uL (ref 0.2–1.0)
Monocytes Relative: 6 %
Neutro Abs: 3 10*3/uL (ref 1.4–6.5)
Neutrophils Relative %: 54 %

## 2015-03-29 LAB — BASIC METABOLIC PANEL
ANION GAP: 8 (ref 5–15)
BUN: 21 mg/dL — AB (ref 6–20)
CALCIUM: 8.6 mg/dL — AB (ref 8.9–10.3)
CHLORIDE: 96 mmol/L — AB (ref 101–111)
CO2: 31 mmol/L (ref 22–32)
CREATININE: 0.72 mg/dL (ref 0.61–1.24)
GFR calc Af Amer: >60 mL/min (ref >60)
GFR calc non Af Amer: >60 mL/min (ref >60)
Glucose, Bld: 259 mg/dL — ABNORMAL HIGH (ref 65–99)
POTASSIUM: 4.1 mmol/L (ref 3.5–5.1)
SODIUM: 135 mmol/L (ref 135–145)

## 2015-03-29 LAB — APTT: aPTT: 27 seconds (ref 24–36)

## 2015-03-29 LAB — PROTIME-INR
INR: 0.9
PROTHROMBIN TIME: 12.4 s (ref 11.4–15.0)

## 2015-03-29 NOTE — Patient Instructions (Signed)
  Your procedure is scheduled on:7/29/16Report to Day Surgery.  [facility] SECOND FLOOR To find out your arrival time please call [phone] between 1PM - 3PM on   03/29/15  Remember: Instructions that are not followed completely may result in serious medical risk, up to and including death, or upon the discretion of your surgeon and anesthesiologist your surgery may need to be rescheduled.    __X__ 1. Do not eat food or drink liquids after midnight. No gum chewing or hard candies.     __X__ 2. No Alcohol for 24 hours before or after surgery.   ____ 3. Bring all medications with you on the day of surgery if instructed.    __X__ 4. Notify your doctor if there is any change in your medical condition     (cold, fever, infections).     Do not wear jewelry, make-up, hairpins, clips or nail polish.  Do not wear lotions, powders, or perfumes. You may wear deodorant.  Do not shave 48 hours prior to surgery. Men may shave face and neck.  Do not bring valuables to the hospital.    [facility] is not responsible for any belongings or valuables.               Contacts, dentures or bridgework may not be worn into surgery.  Leave your suitcase in the car. After surgery it may be brought to your room.  For patients admitted to the hospital, discharge time is determined by your                treatment team.   Patients discharged the day of surgery will not be allowed to drive home.   Please read over the following fact sheets that you were given:   Surgical Site Infection Prevention   ____ Take these medicines the morning of surgery with A SIP OF WATER:    1.AS INSTRUCTED BY HEART DOCTOR  2.   3.   4.  5.  6.  ____ Fleet Enema (as directed)   ___X_ Use CHG Soap as directed  ____ Use inhalers on the day of surgery  ____ Stop metformin 2 days prior to surgery    ____ Take 1/2 of usual insulin dose the night before surgery and none on the morning of surgery.   ____ Stop  Coumadin/Plavix/aspirin on   ____ Stop Anti-inflammatories on   ____ Stop supplements until after surgery.    ____ Bring C-Pap to the hospital.

## 2015-03-30 ENCOUNTER — Observation Stay
Admission: RE | Admit: 2015-03-30 | Discharge: 2015-03-31 | Disposition: A | Discharge status: Home / Self Care | Payer: Self-pay | Source: Ambulatory Visit | Attending: Cardiology | Admitting: Cardiology

## 2015-03-30 ENCOUNTER — Encounter: Payer: Self-pay | Admitting: *Deleted

## 2015-03-30 ENCOUNTER — Encounter
Admission: RE | Disposition: A | Discharge status: Home / Self Care | Payer: Self-pay | Source: Ambulatory Visit | Attending: Cardiology

## 2015-03-30 ENCOUNTER — Ambulatory Visit: Payer: Self-pay | Admitting: Anesthesiology

## 2015-03-30 ENCOUNTER — Ambulatory Visit: Payer: Self-pay

## 2015-03-30 DIAGNOSIS — Z950 Presence of cardiac pacemaker: Secondary | ICD-10-CM | POA: Diagnosis present

## 2015-03-30 DIAGNOSIS — Z79899 Other long term (current) drug therapy: Secondary | ICD-10-CM | POA: Insufficient documentation

## 2015-03-30 DIAGNOSIS — I423 Endomyocardial (eosinophilic) disease: Secondary | ICD-10-CM | POA: Insufficient documentation

## 2015-03-30 DIAGNOSIS — I10 Essential (primary) hypertension: Secondary | ICD-10-CM | POA: Insufficient documentation

## 2015-03-30 DIAGNOSIS — I252 Old myocardial infarction: Secondary | ICD-10-CM | POA: Insufficient documentation

## 2015-03-30 DIAGNOSIS — R0602 Shortness of breath: Secondary | ICD-10-CM | POA: Insufficient documentation

## 2015-03-30 DIAGNOSIS — I5022 Chronic systolic (congestive) heart failure: Secondary | ICD-10-CM | POA: Insufficient documentation

## 2015-03-30 DIAGNOSIS — I420 Dilated cardiomyopathy: Principal | ICD-10-CM | POA: Insufficient documentation

## 2015-03-30 DIAGNOSIS — R002 Palpitations: Secondary | ICD-10-CM | POA: Insufficient documentation

## 2015-03-30 DIAGNOSIS — E780 Pure hypercholesterolemia: Secondary | ICD-10-CM | POA: Insufficient documentation

## 2015-03-30 DIAGNOSIS — Z8249 Family history of ischemic heart disease and other diseases of the circulatory system: Secondary | ICD-10-CM | POA: Insufficient documentation

## 2015-03-30 DIAGNOSIS — R55 Syncope and collapse: Secondary | ICD-10-CM | POA: Insufficient documentation

## 2015-03-30 DIAGNOSIS — K219 Gastro-esophageal reflux disease without esophagitis: Secondary | ICD-10-CM | POA: Insufficient documentation

## 2015-03-30 DIAGNOSIS — R079 Chest pain, unspecified: Secondary | ICD-10-CM | POA: Insufficient documentation

## 2015-03-30 DIAGNOSIS — F17200 Nicotine dependence, unspecified, uncomplicated: Secondary | ICD-10-CM | POA: Insufficient documentation

## 2015-03-30 DIAGNOSIS — Z7982 Long term (current) use of aspirin: Secondary | ICD-10-CM | POA: Insufficient documentation

## 2015-03-30 DIAGNOSIS — G4733 Obstructive sleep apnea (adult) (pediatric): Secondary | ICD-10-CM | POA: Insufficient documentation

## 2015-03-30 HISTORY — PX: ICD LEAD REMOVAL: SHX5855

## 2015-03-30 LAB — POTASSIUM: Potassium: 4.5 mmol/L (ref 3.5–5.1)

## 2015-03-30 SURGERY — REMOVAL, ELECTRODE LEAD, ICD
Anesthesia: General | Wound class: Clean

## 2015-03-30 MED ORDER — HEPARIN SODIUM (PORCINE) 5000 UNIT/ML IJ SOLN
INTRAMUSCULAR | Status: AC
  Filled 2015-03-30: qty 1

## 2015-03-30 MED ORDER — FAMOTIDINE 20 MG PO TABS
20.0000 mg | ORAL_TABLET | Freq: Once | ORAL | Status: AC
  Administered 2015-03-30: 20 mg via ORAL

## 2015-03-30 MED ORDER — ATORVASTATIN CALCIUM 20 MG PO TABS
80.0000 mg | ORAL_TABLET | Freq: Every day | ORAL | Status: DC
  Administered 2015-03-30: 80 mg via ORAL
  Filled 2015-03-30: qty 4

## 2015-03-30 MED ORDER — PANTOPRAZOLE SODIUM 40 MG PO TBEC
40.0000 mg | DELAYED_RELEASE_TABLET | Freq: Every day | ORAL | Status: DC
  Administered 2015-03-30 – 2015-03-31 (×2): 40 mg via ORAL
  Filled 2015-03-30 (×2): qty 1

## 2015-03-30 MED ORDER — FENTANYL CITRATE (PF) 100 MCG/2ML IJ SOLN: 25.0000 ug | INTRAMUSCULAR | Status: DC | PRN

## 2015-03-30 MED ORDER — KETAMINE HCL 50 MG/ML IJ SOLN
INTRAMUSCULAR | Status: DC | PRN
  Administered 2015-03-30: 50 mg via INTRAMUSCULAR

## 2015-03-30 MED ORDER — PROPOFOL INFUSION 10 MG/ML OPTIME
INTRAVENOUS | Status: DC | PRN
  Administered 2015-03-30: 50 ug/kg/min via INTRAVENOUS

## 2015-03-30 MED ORDER — ASPIRIN EC 325 MG PO TBEC
325.0000 mg | DELAYED_RELEASE_TABLET | Freq: Every day | ORAL | Status: DC
  Administered 2015-03-30 – 2015-03-31 (×2): 325 mg via ORAL
  Filled 2015-03-30 (×2): qty 1

## 2015-03-30 MED ORDER — FUROSEMIDE 20 MG PO TABS
20.0000 mg | ORAL_TABLET | Freq: Every day | ORAL | Status: DC
  Administered 2015-03-30 – 2015-03-31 (×2): 20 mg via ORAL
  Filled 2015-03-30 (×2): qty 1

## 2015-03-30 MED ORDER — GENTAMICIN SULFATE 40 MG/ML IJ SOLN
INTRAMUSCULAR | Status: AC
  Filled 2015-03-30: qty 2

## 2015-03-30 MED ORDER — LIDOCAINE 1 % OPTIME INJ - NO CHARGE
INTRAMUSCULAR | Status: DC | PRN
  Administered 2015-03-30: 20 mL

## 2015-03-30 MED ORDER — LACTATED RINGERS IV SOLN
INTRAVENOUS | Status: DC
  Administered 2015-03-30: 75 mL/h via INTRAVENOUS
  Administered 2015-03-30: 14:00:00 via INTRAVENOUS
  Administered 2015-03-30: 75 mL/h via INTRAVENOUS

## 2015-03-30 MED ORDER — CARVEDILOL 6.25 MG PO TABS
6.2500 mg | ORAL_TABLET | Freq: Two times a day (BID) | ORAL | Status: DC
  Administered 2015-03-30 – 2015-03-31 (×2): 6.25 mg via ORAL
  Filled 2015-03-30 (×3): qty 1

## 2015-03-30 MED ORDER — ONDANSETRON HCL 4 MG/2ML IJ SOLN: 4.0000 mg | Freq: Four times a day (QID) | INTRAMUSCULAR | Status: DC | PRN

## 2015-03-30 MED ORDER — FAMOTIDINE 20 MG PO TABS
ORAL_TABLET | ORAL | Status: AC
  Administered 2015-03-30: 20 mg via ORAL
  Filled 2015-03-30: qty 1

## 2015-03-30 MED ORDER — MIDAZOLAM HCL 2 MG/2ML IJ SOLN
INTRAMUSCULAR | Status: DC | PRN
  Administered 2015-03-30 (×2): 1 mg via INTRAVENOUS
  Administered 2015-03-30: 2 mg via INTRAVENOUS

## 2015-03-30 MED ORDER — GENTAMICIN SULFATE 40 MG/ML IJ SOLN
80.0000 mg | Freq: Once | INTRAMUSCULAR | Status: DC
  Filled 2015-03-30: qty 2

## 2015-03-30 MED ORDER — ONDANSETRON HCL 4 MG/2ML IJ SOLN: 4.0000 mg | Freq: Once | INTRAMUSCULAR | Status: DC | PRN

## 2015-03-30 MED ORDER — HYDRALAZINE HCL 25 MG PO TABS
25.0000 mg | ORAL_TABLET | Freq: Two times a day (BID) | ORAL | Status: DC
  Administered 2015-03-30 – 2015-03-31 (×2): 25 mg via ORAL
  Filled 2015-03-30 (×2): qty 1

## 2015-03-30 MED ORDER — GENTAMICIN SULFATE 40 MG/ML IJ SOLN
INTRAMUSCULAR | Status: DC | PRN
  Administered 2015-03-30: 200 mL

## 2015-03-30 MED ORDER — CARVEDILOL 12.5 MG PO TABS
ORAL_TABLET | ORAL | Status: AC
  Administered 2015-03-30: 6.25 mg via ORAL
  Filled 2015-03-30: qty 1

## 2015-03-30 MED ORDER — LISINOPRIL 20 MG PO TABS
20.0000 mg | ORAL_TABLET | Freq: Every day | ORAL | Status: DC
  Administered 2015-03-30 – 2015-03-31 (×2): 20 mg via ORAL
  Filled 2015-03-30 (×2): qty 1

## 2015-03-30 MED ORDER — ACETAMINOPHEN 325 MG PO TABS
325.0000 mg | ORAL_TABLET | ORAL | Status: DC | PRN
  Administered 2015-03-30 – 2015-03-31 (×2): 650 mg via ORAL
  Filled 2015-03-30 (×2): qty 2

## 2015-03-30 MED ORDER — CARVEDILOL 12.5 MG PO TABS
6.2500 mg | ORAL_TABLET | Freq: Once | ORAL | Status: AC
  Administered 2015-03-30: 6.25 mg via ORAL

## 2015-03-30 MED ORDER — CEFAZOLIN SODIUM-DEXTROSE 2-3 GM-% IV SOLR
2.0000 g | INTRAVENOUS | Status: AC
  Administered 2015-03-30: 2 g via INTRAVENOUS

## 2015-03-30 MED ORDER — POTASSIUM CHLORIDE CRYS ER 20 MEQ PO TBCR
20.0000 meq | EXTENDED_RELEASE_TABLET | Freq: Every day | ORAL | Status: DC
  Administered 2015-03-30 – 2015-03-31 (×2): 20 meq via ORAL
  Filled 2015-03-30 (×2): qty 1

## 2015-03-30 MED ORDER — CEFAZOLIN SODIUM-DEXTROSE 2-3 GM-% IV SOLR
INTRAVENOUS | Status: AC
  Administered 2015-03-30: 2 g via INTRAVENOUS
  Filled 2015-03-30: qty 50

## 2015-03-30 SURGICAL SUPPLY — 43 items
BAG DECANTER STRL (MISCELLANEOUS) ×3 IMPLANT
CABLE SURG 12 DISP A/V CHANNEL (MISCELLANEOUS) ×3 IMPLANT
CANISTER SUCT 1200ML W/VALVE (MISCELLANEOUS) ×3 IMPLANT
CHLORAPREP W/TINT 26ML (MISCELLANEOUS) ×3 IMPLANT
COVER LIGHT HANDLE STERIS (MISCELLANEOUS) ×6 IMPLANT
COVER MAYO STAND STRL (DRAPES) ×3 IMPLANT
COVER PROBE FLX POLY STRL (MISCELLANEOUS) IMPLANT
DRAPE C-ARM XRAY 36X54 (DRAPES) ×3 IMPLANT
DRESSING TELFA 4X3 1S ST N-ADH (GAUZE/BANDAGES/DRESSINGS) ×3 IMPLANT
DRSG TEGADERM 4X4.75 (GAUZE/BANDAGES/DRESSINGS) ×3 IMPLANT
GLOVE BIO SURGEON STRL SZ7.5 (GLOVE) ×6 IMPLANT
GOWN STRL REUS W/ TWL LRG LVL3 (GOWN DISPOSABLE) IMPLANT
GOWN STRL REUS W/ TWL XL LVL3 (GOWN DISPOSABLE) ×2 IMPLANT
GOWN STRL REUS W/TWL LRG LVL3 (GOWN DISPOSABLE)
GOWN STRL REUS W/TWL XL LVL3 (GOWN DISPOSABLE) ×4
HANDLE YANKAUER SUCT BULB TIP (MISCELLANEOUS) ×3 IMPLANT
ICD EVERA DR XT MRI DDMB1D4 (ICD Generator) ×3 IMPLANT
IMMOBILIZER SHDR LG LX 900803 (SOFTGOODS) ×3 IMPLANT
INTRO PACEMAKR LEAD 9FR 13CM (INTRODUCER) ×3
INTRO PACEMKR SHEATH II 7FR (MISCELLANEOUS) ×3
INTRODUCER PACEMKR LD 9FR 13CM (INTRODUCER) ×1 IMPLANT
INTRODUCER PACEMKR SHTH II 7FR (MISCELLANEOUS) ×1 IMPLANT
IV NS 1000ML (IV SOLUTION) ×2
IV NS 1000ML BAXH (IV SOLUTION) ×1 IMPLANT
KIT RM TURNOVER STRD PROC AR (KITS) ×3 IMPLANT
LABEL OR SOLS (LABEL) ×3 IMPLANT
LEAD CAPSURE NOVUS 5076-52CM (Lead) ×3 IMPLANT
LEAD SPRINT QUAT SEC 6935M-62 (Lead) ×3 IMPLANT
NDL SAFETY 25GX1.5 (NEEDLE) ×3 IMPLANT
NEEDLE FILTER BLUNT 18X 1/2SAF (NEEDLE) ×2
NEEDLE FILTER BLUNT 18X1 1/2 (NEEDLE) ×1 IMPLANT
NEEDLE SPNL 22GX3.5 QUINCKE BK (NEEDLE) IMPLANT
NS IRRIG 500ML POUR BTL (IV SOLUTION) ×3 IMPLANT
PACK PACE INSERTION (MISCELLANEOUS) ×3 IMPLANT
PAD GROUND ADULT SPLIT (MISCELLANEOUS) ×3 IMPLANT
PAD STATPAD (MISCELLANEOUS) ×3 IMPLANT
SUT DVC V-LOC 4-0 90 CLR P-12 (SUTURE) ×3
SUT DVC VLOC 3-0 CL 6 P-12 (SUTURE) ×3 IMPLANT
SUT SILK 0 CT 1 30 (SUTURE) ×6 IMPLANT
SUT VIC AB 3-0 PS2 18 (SUTURE) ×3 IMPLANT
SUTURE DVC V-LC4-0 90 CLR P-12 (SUTURE) ×1 IMPLANT
SYR CONTROL 10ML (SYRINGE) ×3 IMPLANT
SYRINGE 10CC LL (SYRINGE) ×3 IMPLANT

## 2015-03-30 NOTE — Anesthesia Preprocedure Evaluation (Signed)
Anesthesia Evaluation  Patient identified by MRN, date of birth, ID band Patient awake    Reviewed: Allergy & Precautions, NPO status , Patient's Chart, lab work & pertinent test results  History of Anesthesia Complications Negative for: history of anesthetic complications  Airway Mallampati: III  TM Distance: >3 FB     Dental  (+) Missing, Loose, Chipped, Poor Dentition   Pulmonary sleep apnea and Continuous Positive Airway Pressure Ventilation , Current Smoker (1/2 ppd),          Cardiovascular hypertension, + CAD, + Past MI and +CHF     Neuro/Psych    GI/Hepatic GERD-  Medicated and Poorly Controlled,  Endo/Other    Renal/GU      Musculoskeletal   Abdominal   Peds  Hematology   Anesthesia Other Findings   Reproductive/Obstetrics                             Anesthesia Physical Anesthesia Plan  ASA: III  Anesthesia Plan: General   Post-op Pain Management:    Induction: Intravenous  Airway Management Planned: Nasal Cannula  Additional Equipment:   Intra-op Plan:   Post-operative Plan:   Informed Consent: I have reviewed the patients History and Physical, chart, labs and discussed the procedure including the risks, benefits and alternatives for the proposed anesthesia with the patient or authorized representative who has indicated his/her understanding and acceptance.     Plan Discussed with:   Anesthesia Plan Comments:         Anesthesia Quick Evaluation

## 2015-03-30 NOTE — Anesthesia Procedure Notes (Signed)
Date/Time: 03/30/2015 2:05 PM Performed by: Nelda Marseille Pre-anesthesia Checklist: Patient identified, Emergency Drugs available, Suction available, Patient being monitored and Timeout performed Oxygen Delivery Method: Simple face mask Airway Equipment and Method: Oral airway Comments: #10 oral airway placed and #7 nasal airway placed breathing 10L/min vis simple oxygen mask

## 2015-03-30 NOTE — H&P (Deleted)
Referring Provider: Jobe Gibbon, MD [place], [place] [address]  Primary Care Provider: [place], [place] [address]   Patient Profile:   Oscar [name] is a very pleasant 39 y.o. male who I was asked to see for consideration of a primary prevention ICD in a patient with NICM, OSA, obesity, HTN ? SVT no dcoumentation.   Problem List:  Problem List Date Reviewed: 13-Mar-2015  Codes Priority Class Noted - Resolved  Obstructive sleep apnea ICD-10-CM: G47.33 ICD-9-CM: 327.23 01/02/2015 - Present  Endomyocardial disease ICD-10-CM: I42.3 ICD-9-CM: 425.4 01/02/2015 - Present  Hypoxemia ICD-10-CM: R09.02 ICD-9-CM: 799.02 01/02/2015 - Present  Other specified abnormal findings of blood chemistry ICD-10-CM: R79.89 ICD-9-CM: 790.6 01/02/2015 - Present  Chest pain ICD-10-CM: R07.9 ICD-9-CM: 786.50 01/02/2015 - Present  Acute combined systolic and diastolic heart failure GHW-29-HB: I50.41 ICD-9-CM: 428.41 01/02/2015 - Present  Chronic respiratory failure ICD-10-CM: J96.10 ICD-9-CM: 518.83 12/12/2014 - Present  Supraventricular tachycardia ICD-10-CM: I47.1 ICD-9-CM: 427.89 12/09/2014 - Present  Acute myocardial infarction, subendocardial infarction ICD-10-CM: I21.4 ICD-9-CM: 410.70 12/09/2014 - Present  Essential hypertension ICD-10-CM: I10 ICD-9-CM: 401.9 12/09/2014 - Present     Current Medications:  Current Outpatient Prescriptions  Medication Sig Dispense Refill  . aspirin 325 MG EC tablet Take by mouth.  Marland Kitchen atorvastatin (LIPITOR) 80 MG tablet Take by mouth.  . carvedilol (COREG) 25 MG tablet Take 1 tablet (25 mg total) by mouth 2 (two) times daily with meals. 60 tablet 5  . FUROsemide (LASIX) 20 MG tablet Take by mouth.  . hydrALAZINE (APRESOLINE) 25 MG tablet Take 1 tablet (25 mg total) by mouth 3 (three) times daily. 90 tablet 5  . lisinopril (PRINIVIL,ZESTRIL) 40 MG tablet Take 1 tablet  (40 mg total) by mouth once daily. 30 tablet 5  . omeprazole (PRILOSEC) 20 MG DR capsule Take by mouth.  . potassium chloride (K-DUR,KLOR-CON) 20 MEQ ER tablet Take by mouth.   No current facility-administered medications for this visit.    Allergies:  Review of patient's allergies indicates no known allergies.  History of Present Illness   Oscar [name] is a very pleasant 39 y.o. male with NICM. The patient has had shortness of breath, has had chest pain, has had near syncope, has not had syncope, and has had palpitations. The patient has had orthopnea and PND.  He was hospitalized at [facility] in [facility] 12/09/2014 12/13/2014. He presented with cp and dyspnea/ EMS reported SVT and ST elevation. He was bought to the ER and treated with adenosine x 2 with restoration of NSR. Strips unavailable for my review. Ekg notable for LVH with repolarization changes. He was diuresed and treated for decompensated HF. LHC and RHC pefomred with no obstructive epicardial CAD. Elevated filling pressure wedge 24. ECHO and cath notable for EF 20-25% RANGE. It was recommended he have an outpatient evaluation of OSA and empirically treated with cpap per d/c summary. Has been followed closely by [facility] cardiology. Echo 02/16/2015 EF 25% dilated LV suggestive chronic cardiomyopathy. His NYHA class is II-III. 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   Family History   Patient denies any family history of sudden cardiac death.  Social History   History   Social History  . Marital Status: Single  Spouse Name: N/A  . Number of Children: N/A  . Years of Education: N/A   Occupational  History  . Not currently employed previously worked as a Training and development officer.   Social History Main Topics  . Smoking status: Current Every Day Smoker  . Smokeless tobacco: Never Used  . Alcohol Use: No  . Drug Use: No  . Sexual Activity: Defer   Other Topics Concern  . Not  on file   Social History Narrative   Review of Systems   14 systems reviewed pertinent positives and negatives as noted in the HPI and below.  Physical Examinations   Vital Signs: BP 150/100 mmHg  Pulse 86  Temp(Src) 36.8 C (98.2 F)  Ht 165.1 cm (5\' 5" )  Wt 151.501 kg (334 lb)  BMI 55.58 kg/m2  SpO2 92% Body mass index is 55.58 kg/(m^2). General appearance: He appears well, in no apparent distress. Alert and oriented times three. Obese Neck: supple, no significant adenopathy, carotids upstroke normal bilaterally, no bruits   Unable to assess JVP due to habitus.  Thyroid: thyroid is normal in size without nodules or tenderness   Lungs: clear to auscultation, no wheezes, rales or rhonchi, symmetric air entry   CV: normal rate and regular rhythm, S4 present  Abdomen: soft, nontender, nondistended, no masses or organomegaly   Neurological: alert, oriented, normal speech, no focal findings or movement disorder noted   Musculoskeletal: full range of motion without pain, moves all extremities. Ambulates without difficulty. There is no erythema, erosion or discharge.  Extremities: no edema  ECG  Rhythm: normal sinus rhythm, rate=88 bpm, QRS=128, LVH No ST segment or T-wave abnormalities.  Echocardiogram  Date: 02/06/2015 ECHOCARDIOGRAPHIC MEASUREMENTS 2D DIMENSIONS AORTA       Values   Normal Range   MAIN PA     Values   Normal Range     Annulus: nm*    [2.3 - 2.9]        PA Main: nm*    [1.5 - 2.1]    Aorta Sin: nm*    [3.1 - 3.7]    RIGHT VENTRICLE   ST Junction: nm*    [2.6 - 3.2]        RV Base: 4.8 cm  [ < 4.2]    Asc.Aorta: nm*    [2.6 - 3.4]         RV Mid: nm*    [ < 3.5] LEFT VENTRICLE                    RV Length: nm*    [ < 8.6]      LVIDd: 6.7 cm  [4.2 - 5.9]    INFERIOR VENA CAVA      LVIDs: 6.2 cm                Max. IVC: nm*    [ <= 2.1]        FS: 7.5 %   [> 25]          Min. IVC: nm*       SWT: 2.1 cm  [0.6 - 1.0]          ------------------       PWT: 1.7 cm  [0.6 - 1.0]          nm* - not measured LEFT ATRIUM     LA Diam: 5.5 cm  [3.0 - 4.0]   LA A4C Area: nm*    [ < 20]    LA Volume: nm*    [18 - 58]  ___________________________________________________________________________________________ ECHOCARDIOGRAPHIC DESCRIPTIONS  AORTIC ROOT    Size:Normal  Dissection:No dissection  AORTIC VALVE  Leaflets:Tricuspid        Morphology:Normal  Mobility:Fully mobile  LEFT VENTRICLE    Size:MODERATELY ENLARGED    Anterior:HYPOCONTRACTILE Contraction:SEVERE GLOBAL DECREASE   Lateral:HYPOCONTRACTILE Closest EF:25% (Estimated)       Septal:HYPOCONTRACTILE  LV Masses:No Masses          Apical:HYPOCONTRACTILE     ZWC:HENIDP LVH CONCENTRIC   Inferior:HYPOCONTRACTILE                   Posterior:HYPOCONTRACTILE Dias.FxClass:(Grade 1) relaxation abnormal, E/A reversal  MITRAL VALVE  Leaflets:Normal          Mobility:Fully mobile Morphology:Normal  LEFT ATRIUM    Size:MILDLY ENLARGED     LA Masses:No masses  IA Septum:Normal IAS  MAIN PA    Size:Normal  PULMONIC VALVE Morphology:Normal          Mobility:Fully mobile  RIGHT VENTRICLE  RV Masses:No Masses           Size:Normal  Free Wall:Normal         Contraction:Normal  TRICUSPID VALVE  Leaflets:Normal          Mobility:Fully mobile Morphology:Normal  RIGHT ATRIUM    Size:Normal          RA Other:None   RA Mass:No masses  PERICARDIUM    Fluid:No effusion  INFERIOR VENACAVA    Size:Normal Normal respiratory  collapse   ____________________________________________________________________ DOPPLER ECHO and OTHER SPECIAL PROCEDURES  Aortic:No AR             No AS     138.0 cm/sec peak vel     7.6 mmHg peak grad     4.0 mmHg mean grad      5.2 cm^2 by DOPPLER   Mitral:MILD MR            No MS                    2.9 cm^2 by DOPPLER     MV Inflow E Vel=67.1 cm/sec  MV Annulus E'Vel=4.9 cm/sec     E/E'Ratio=13.7  Tricuspid:TRIVIAL TR          No TS     192.0 cm/sec peak TR vel   19.7 mmHg peak RV pressure  Pulmonary:TRIVIAL PR          No PS     ___________________________________________________________________________________________ INTERPRETATION SEVERE LV SYSTOLIC DYSFUNCTION WITH AN ESTIMATED EF = 20-30 % NORMAL RIGHT VENTRICULAR SYSTOLIC FUNCTION MILD MITRAL VALVE INSUFFICIENCY TRACE TRICUSPID VALVE INSUFFICIENCY NO VALVULAR STENOSIS SEVERE LVH MODERATE LV ENLARGEMENT MILD LA ENLARGEMENT  Labs   Hematology No results found for: WBC, HGB, HCT, MCV, PLT  Chemistries No results found for: CREATININE, BUN, NA, K, CL, CO2  Liver Function Studies No results found for: ALT, AST, GGT, ALKPHOS, BILITOT  Thyroid Function Studies No results found for: TSH, T3TOTAL, T4TOTAL, THYROIDAB  INR No results found for: INR  Assessment and Plan  Given the patient's persistently low EF 25%, NYHA class II-III HF sxs on optimal medical therapy he is at an increased risk of sudden cardiac death and will likely benefit from an ICD. I spent some time with him reviewing the potential benefits and risks of the procedure. Specifically, I informed him that the potential risks include but are not limited to infection, bleeding, pneumothorax, and cardiac perforation with or without tamponade. He verbalized understanding, and he signed the consent form in my presence in clinic today.  His procedure is scheduled for  July 29th vendor [employer]. He knows to be n.p.o. after midnight the night before his procedure. I gave him a scrub brush to use the morning of his procedure. We plan to implant a dual-chamber ICD in the left infraclavicular region, and I plan not to do DFT testing. I plan to give him cefazolin 2grams before his procedure. He knows to call us should he have any questions or problems at any time. Vendor MDT MRI compatible device given his co-morbidities.

## 2015-03-30 NOTE — Transfer of Care (Signed)
Immediate Anesthesia Transfer of Care Note  Patient: Oscar [name]  Procedure(s) Performed: Procedure(s): ICD LEAD REMOVAL (N/A)  Patient Location: PACU  Anesthesia Type:General  Level of Consciousness: sedated  Airway & Oxygen Therapy: Patient connected to face mask oxygen  Post-op Assessment: Report given to RN and Post -op Vital signs reviewed and stable  Post vital signs: Reviewed and stable  Last Vitals:  Filed Vitals:   03/30/15 1222  BP: 175/111  Pulse: 91  Temp: 36.6 C  Resp: 16    Complications: No apparent anesthesia complications

## 2015-03-30 NOTE — H&P (Signed)
Referring Provider: Jobe Gibbon, MD [place], [place] [address]  Primary Care Provider: [place],  [id number]   Patient Profile:   Oscar [name] is a very pleasant 39 y.o. male who I was asked to see for consideration of a primary prevention ICD in a patient with NICM, OSA, obesity, HTN ? SVT no dcoumentation.   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 ICD-9-CM: 425.4 01/02/2015 - Present  Hypoxemia ICD-10-CM: R09.02 ICD-9-CM: 799.02 01/02/2015 - Present  Other specified abnormal findings of blood chemistry ICD-10-CM: R79.89 ICD-9-CM: 790.6 01/02/2015 - Present  Chest pain ICD-10-CM: R07.9 ICD-9-CM: 786.50 01/02/2015 - Present  Acute combined systolic and diastolic heart failure UJW-11-BJ: I50.41 ICD-9-CM: 428.41 01/02/2015 - Present  Chronic respiratory failure ICD-10-CM: J96.10 ICD-9-CM: 518.83 12/12/2014 - Present  Supraventricular tachycardia ICD-10-CM: I47.1 ICD-9-CM: 427.89 12/09/2014 - Present  Acute myocardial infarction, subendocardial infarction ICD-10-CM: I21.4 ICD-9-CM: 410.70 12/09/2014 - Present  Essential hypertension ICD-10-CM: I10 ICD-9-CM: 401.9 12/09/2014 - Present     Current Medications:  Current Outpatient Prescriptions  Medication Sig Dispense Refill  . aspirin 325 MG EC tablet Take by mouth.  Marland Kitchen atorvastatin (LIPITOR) 80 MG tablet Take by mouth.  . carvedilol (COREG) 25 MG tablet Take 1 tablet (25 mg total) by mouth 2 (two) times daily with meals. 60 tablet 5  . FUROsemide (LASIX) 20 MG tablet Take by mouth.  . hydrALAZINE (APRESOLINE) 25 MG tablet Take 1 tablet (25 mg total) by mouth 3 (three) times daily. 90 tablet 5  . lisinopril (PRINIVIL,ZESTRIL) 40 MG tablet Take 1 tablet  (40 mg total) by mouth once daily. 30 tablet 5  . omeprazole (PRILOSEC) 20 MG DR capsule Take by mouth.  . potassium chloride (K-DUR,KLOR-CON) 20 MEQ ER tablet Take by mouth.   No current facility-administered medications for this visit.    Allergies:  Review of patient's allergies indicates no known allergies.  History of Present Illness   [name] is a very pleasant 39 y.o. male with NICM. The patient has had shortness of breath, has had chest pain, has had near syncope, has not had syncope, and has had palpitations. The patient has had orthopnea and PND.  He was hospitalized at [facility] in [facility] 12/09/2014 12/13/2014. He presented with cp and dyspnea/ EMS reported SVT and ST elevation. He was bought to the ER and treated with adenosine x 2 with restoration of NSR. Strips unavailable for my review. Ekg notable for LVH with repolarization changes. He was diuresed and treated for decompensated HF. LHC and RHC pefomred with no obstructive epicardial CAD. Elevated filling pressure wedge 24. ECHO and cath notable for EF 20-25% RANGE. It was recommended he have an outpatient evaluation of OSA and empirically treated with cpap per d/c summary. Has been followed closely by [facility] cardiology. Echo 02/16/2015 EF 25% dilated LV suggestive chronic cardiomyopathy. His NYHA class is II-III. 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   Family History   Patient denies any family history of sudden cardiac death.  Social History   History   Social History  . Marital Status: Single  Spouse Name: N/A  . Number of Children: N/A  . Years of Education: N/A   Occupational  History  . Not currently employed previously worked as a Training and development officer.   Social History Main Topics  . Smoking status: Current Every Day Smoker  . Smokeless tobacco: Never Used  . Alcohol Use: No  . Drug Use: No  . Sexual Activity: Defer   Other Topics Concern  . Not  on file   Social History Narrative   Review of Systems   14 systems reviewed pertinent positives and negatives as noted in the HPI and below.  Physical Examinations   Vital Signs: BP 150/100 mmHg  Pulse 86  Temp(Src) 36.8 C (98.2 F)  Ht 165.1 cm (5\' 5" )  Wt 151.501 kg (334 lb)  BMI 55.58 kg/m2  SpO2 92% Body mass index is 55.58 kg/(m^2). General appearance: He appears well, in no apparent distress. Alert and oriented times three. Obese Neck: supple, no significant adenopathy, carotids upstroke normal bilaterally, no bruits   Unable to assess JVP due to habitus.  Thyroid: thyroid is normal in size without nodules or tenderness   Lungs: clear to auscultation, no wheezes, rales or rhonchi, symmetric air entry   CV: normal rate and regular rhythm, S4 present  Abdomen: soft, nontender, nondistended, no masses or organomegaly   Neurological: alert, oriented, normal speech, no focal findings or movement disorder noted   Musculoskeletal: full range of motion without pain, moves all extremities. Ambulates without difficulty. There is no erythema, erosion or discharge.  Extremities: no edema  ECG  Rhythm: normal sinus rhythm, rate=88 bpm, QRS=128, LVH No ST segment or T-wave abnormalities.  Echocardiogram  Date: 02/06/2015 ECHOCARDIOGRAPHIC MEASUREMENTS 2D DIMENSIONS AORTA       Values   Normal Range   MAIN PA     Values   Normal Range     Annulus: nm*    [2.3 - 2.9]        PA Main: nm*    [1.5 - 2.1]    Aorta Sin: nm*    [3.1 - 3.7]    RIGHT VENTRICLE   ST Junction: nm*    [2.6 - 3.2]        RV Base: 4.8 cm  [ < 4.2]    Asc.Aorta: nm*    [2.6 - 3.4]         RV Mid: nm*    [ < 3.5] LEFT VENTRICLE                    RV Length: nm*    [ < 8.6]      LVIDd: 6.7 cm  [4.2 - 5.9]    INFERIOR VENA CAVA      LVIDs: 6.2 cm                Max. IVC: nm*    [ <= 2.1]        FS: 7.5 %   [> 25]          Min. IVC: nm*       SWT: 2.1 cm  [0.6 - 1.0]          ------------------       PWT: 1.7 cm  [0.6 - 1.0]          nm* - not measured LEFT ATRIUM     LA Diam: 5.5 cm  [3.0 - 4.0]   LA A4C Area: nm*    [ < 20]    LA Volume: nm*    [18 - 58]  ___________________________________________________________________________________________ ECHOCARDIOGRAPHIC DESCRIPTIONS  AORTIC ROOT    Size:Normal  Dissection:No dissection  AORTIC VALVE  Leaflets:Tricuspid        Morphology:Normal  Mobility:Fully mobile  LEFT VENTRICLE    Size:MODERATELY ENLARGED    Anterior:HYPOCONTRACTILE Contraction:SEVERE GLOBAL DECREASE   Lateral:HYPOCONTRACTILE Closest EF:25% (Estimated)       Septal:HYPOCONTRACTILE  LV Masses:No Masses          Apical:HYPOCONTRACTILE     WCH:ENIDPO LVH CONCENTRIC   Inferior:HYPOCONTRACTILE                   Posterior:HYPOCONTRACTILE Dias.FxClass:(Grade 1) relaxation abnormal, E/A reversal  MITRAL VALVE  Leaflets:Normal          Mobility:Fully mobile Morphology:Normal  LEFT ATRIUM    Size:MILDLY ENLARGED     LA Masses:No masses  IA Septum:Normal IAS  MAIN PA    Size:Normal  PULMONIC VALVE Morphology:Normal          Mobility:Fully mobile  RIGHT VENTRICLE  RV Masses:No Masses           Size:Normal  Free Wall:Normal         Contraction:Normal  TRICUSPID VALVE  Leaflets:Normal          Mobility:Fully mobile Morphology:Normal  RIGHT ATRIUM    Size:Normal          RA Other:None   RA Mass:No masses  PERICARDIUM    Fluid:No effusion  INFERIOR VENACAVA    Size:Normal Normal respiratory  collapse   ____________________________________________________________________ DOPPLER ECHO and OTHER SPECIAL PROCEDURES  Aortic:No AR             No AS     138.0 cm/sec peak vel     7.6 mmHg peak grad     4.0 mmHg mean grad      5.2 cm^2 by DOPPLER   Mitral:MILD MR            No MS                    2.9 cm^2 by DOPPLER     MV Inflow E Vel=67.1 cm/sec  MV Annulus E'Vel=4.9 cm/sec     E/E'Ratio=13.7  Tricuspid:TRIVIAL TR          No TS     192.0 cm/sec peak TR vel   19.7 mmHg peak RV pressure  Pulmonary:TRIVIAL PR          No PS     ___________________________________________________________________________________________ INTERPRETATION SEVERE LV SYSTOLIC DYSFUNCTION WITH AN ESTIMATED EF = 20-30 % NORMAL RIGHT VENTRICULAR SYSTOLIC FUNCTION MILD MITRAL VALVE INSUFFICIENCY TRACE TRICUSPID VALVE INSUFFICIENCY NO VALVULAR STENOSIS SEVERE LVH MODERATE LV ENLARGEMENT MILD LA ENLARGEMENT  Labs   Hematology No results found for: WBC, HGB, HCT, MCV, PLT  Chemistries No results found for: CREATININE, BUN, NA, K, CL, CO2  Liver Function Studies No results found for: ALT, AST, GGT, ALKPHOS, BILITOT  Thyroid Function Studies No results found for: TSH, T3TOTAL, T4TOTAL, THYROIDAB  INR No results found for: INR  Assessment and Plan  Given the patient's persistently low EF 25%, NYHA class II-III HF sxs on optimal medical therapy he is at an increased risk of sudden cardiac death and will likely benefit from an ICD. I spent some time with him reviewing the potential benefits and risks of the procedure. Specifically, I informed him that the potential risks include but are not limited to infection, bleeding, pneumothorax, and cardiac perforation with or without tamponade. He verbalized understanding, and he signed the consent form in my presence in clinic today.  His procedure is scheduled for  July 29th vendor Medtronic. He knows to be n.p.o. after midnight the night before his procedure. I gave him a scrub brush to use the morning of his procedure. We plan to implant a dual-chamber ICD in the left infraclavicular region, and I plan not to do DFT testing. I plan to give him cefazolin 2grams before his procedure. He knows to call us should he have any questions or problems at any time. Vendor MDT MRI compatible device given his co-morbidities.   I have reviewed the patients condition today and there are no changes in his history or sxs.  I have updated his physical exam findings.

## 2015-03-30 NOTE — Brief Op Note (Signed)
03/30/2015  3:44 PM  PATIENT:  Oscar [name]  39 y.o. male  PRE-OPERATIVE DIAGNOSIS:  CARDIOMIOPATHY  POST-OPERATIVE DIAGNOSIS:  Same, Dual Chamber ICD implantation   PROCEDURE:  Dual Chamber ICD implant  SURGEON:  Surgeon(s) and Role:    * Marzetta Board, MD - Primary  ANESTHESIA:   local and IV sedation  EBL:     BLOOD ADMINISTERED:none  DRAINS: none   LOCAL MEDICATIONS USED:  LIDOCAINE   SPECIMEN:  No Specimen  DISPOSITION OF SPECIMEN:  N/A  COUNTS:  YES  TOURNIQUET:  * No tourniquets in log *  DICTATION: .Note written in EPIC  PLAN OF CARE: Admit for overnight observation  PATIENT DISPOSITION:  PACU - hemodynamically stable.   Delay start of Pharmacological VTE agent (>24hrs) due to surgical blood loss or risk of bleeding: yes

## 2015-03-30 NOTE — Op Note (Signed)
Oscar [name] [id number]  [id number]  Preop MH:DQQI EF 20% Postop Dx same NYHA Class III  Cx: none apparent   Procedure: dual  chamber ICD implantation without intraoperative defibrillation threshold testing  Following the obtaining of informed consent the patient was brought to the electrophysiology laboratory in place of the fluoroscopic table in the supine position. After routine prep and drape, lidocaine was infiltrated in the prepectoral subclavicular region and an incision was made and carried down to the layer of the prepectoral fascia using electrocautery and sharp dissection. A pocket was formed similarly.  Thereafter  attention was turned to gaining access to the extrathoracic left subclavian vein which was accomplished without difficulty and without the aspiration of air or puncture of the artery.Two separate venipunctures were accomplished  Sequentially  A 9 French sheath  And 65F sheath were placed through which were   passed a  Single coil   active fixation defibrillator lead, model 6935M serial number [id number]  and a 5076 active fixation atrial lead, serial number [id number]  .  They were  passed under fluoroscopic guidance to the right ventricular septum and R atrial lateral wall  respectively.    In its location the bipolar R wave was 20 millivolts, impedance was 608 ohms, the pacing threshold was 0.5 volts at 0.4 msec.   There was no diaphragmatic pacing at 10 V. The current of injury was brisk.  The bipolar P wave was 5.64millivolts, impedance was 494 ohms, the pacing threshold was 0.5v at 0.4 msec.   There was no diaphragmatic pacing at 10 V. The current of injury was brisk.   The leads were secured to the prepectoral fascia and then attached to a Medtronic MRI compatible ICD, serial numbermodel [id number] serial number [id number]     .  Through the device, the bipolar R wave was 20illivolts, impedance was 655ms, the pacing threshold was 0.75s at 0.16msec.  The bipolar P wave was  5.3llivolts, impedance was 46ms, the pacing threshold was 0.5lts at 0.72msec. High-voltage impedance was  86 ohms.      The pocket was copiously irrigated with antibiotic containing saline solution. Hemostasis was assured, and the device and the leads were placed in the pocket and secured to the prepectoral fascia.  The wound was closed in 2.o vicryl and 3.0 v-lock layers in normal fashion subcuticular with 4.0 v lock. The wound was washed dried and  Steri-Strips and tegaderrm dressing was  applied. Needle counts, sponge counts and instrument counts were correct at the end of the procedure according to the staff.  EBL minimal .

## 2015-03-31 MED ORDER — LISINOPRIL 20 MG PO TABS: 20.0000 mg | ORAL_TABLET | Freq: Every day | ORAL | Status: DC

## 2015-03-31 MED ORDER — ATORVASTATIN CALCIUM 80 MG PO TABS: 80.0000 mg | ORAL_TABLET | Freq: Every day | ORAL | Status: DC

## 2015-03-31 MED ORDER — ASPIRIN 325 MG PO TBEC: 325.0000 mg | DELAYED_RELEASE_TABLET | Freq: Every day | ORAL | Status: DC

## 2015-03-31 MED ORDER — CARVEDILOL 6.25 MG PO TABS: 6.2500 mg | ORAL_TABLET | Freq: Two times a day (BID) | ORAL | Status: DC

## 2015-03-31 MED ORDER — FUROSEMIDE 20 MG PO TABS: 20.0000 mg | ORAL_TABLET | Freq: Every day | ORAL | Status: DC

## 2015-03-31 MED ORDER — HYDRALAZINE HCL 25 MG PO TABS: 25.0000 mg | ORAL_TABLET | Freq: Two times a day (BID) | ORAL | Status: DC

## 2015-03-31 NOTE — Progress Notes (Signed)
As pt signed AMA form Dr Nehemiah Massed walked in and placed D/C orders for pt. Discharge instructions went over with pt with verbal acknowledgment of understanding. Pt ambulated self off unit.

## 2015-03-31 NOTE — Discharge Summary (Signed)
[facility] Cardiology Discharge Summary  Patient ID: Oscar [name] MRN: [id number] DOB/AGE: 1976-01-11 39 y.o.  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 Melchor Amour Course:  The patient has had undergone a defibrillator placement for severe systolic dysfunction congestive heart failure and dilated cardiomyopathy for which she is on stable medication management including carvedilol hydralazine lisinopril. The patient additionally has furosemide for further lower extremity edema with no evidence of heart failure during this hospital at administration. The patient also has had a high intensity cholesterol therapy with atorvastatin aspirin without bleeding complication status post left upper chest incision and with defibrillator placement. The patient was ambulating well with no evidence of complications from this procedure. He was tolerating them her seizure well and therefore had his maximal hospital benefit. He is discharged in good condition with follow-up with Dr. Gerre Couch in 7 days Discharge Exam: Blood pressure 160/105, pulse 91, temperature 97.6 F (36.4 C), temperature source Oral, resp. rate 21, height 5\' 5"  (1.651 m), weight 347 lb 1.6 oz (157.444 kg), SpO2 93 %.  Constitutional: Alet oriented to person, place, and time. No distress.  HENT: No nasal discharge.  Head: Normocephalic and atraumatic.  Eyes: Pupils are equal and round. No discharge.  Neck: Normal range of motion. Neck supple. No JVD  present. No thyromegaly present.  Cardiovascular: Normal rate, regular rhythm, normal S1 S2, no gallop, no friction rub. No murmur Pulmonary/Chest: Effort normal, No stridor. No respiratory distress. no wheezes.  no rales.    Abdominal: Soft. Bowel sounds are normal.  no distension.  no tenderness. There is no rebound and no guarding.  Musculoskeletal: No edema, no cyanosis, normal pulses, no bleeding, Normal range of motion. no tenderness.  Neurological:  alert and oriented to person, place, and time. Coordination normal.  Skin: Skin is warm and dry. No rash noted. No erythema. No pallor.  Psychiatric:  normal mood and affect. behavior is normal.    Labs:   Lab Results  Component Value Date   WBC 5.5 03/29/2015   HGB 16.5 03/29/2015   HCT 47.7 03/29/2015   MCV 93.3 03/29/2015   PLT 162 03/29/2015    Recent Labs Lab 03/29/15 1107 03/30/15 1243  NA 135  --   K 4.1 4.5  CL 96*  --   CO2 31  --   BUN 21*  --   CREATININE 0.72  --   CALCIUM 8.6*  --   GLUCOSE 259*  --     EKG: NSR without evidence of new changes  FOLLOW UP IN ONE TO TWO WEEKS    Medication List    TAKE these medications        aspirin EC 325 MG tablet  Take 325 mg by mouth daily.     aspirin 325 MG EC tablet  Take 1 tablet (325 mg total) by mouth daily.     atorvastatin 80 MG tablet  Commonly known as:  LIPITOR  Take 1 tablet (80 mg total) by mouth daily at 6 PM.     atorvastatin 80 MG tablet  Commonly known as:  LIPITOR  Take 1  tablet (80 mg total) by mouth daily at 6 PM.     carvedilol 6.25 MG tablet  Commonly known as:  COREG  Take 1 tablet (6.25 mg total) by mouth 2 (two) times daily with a meal.     carvedilol 6.25 MG tablet  Commonly known as:  COREG  Take 1 tablet (6.25 mg total) by mouth 2 (two) times daily with a meal.     furosemide 20 MG tablet  Commonly known as:  LASIX  Take 20 mg by mouth daily.     furosemide 20 MG tablet  Commonly known as:  LASIX  Take 1 tablet (20  mg total) by mouth daily.     hydrALAZINE 25 MG tablet  Commonly known as:  APRESOLINE  Take 25 mg by mouth 2 (two) times daily.     hydrALAZINE 25 MG tablet  Commonly known as:  APRESOLINE  Take 1 tablet (25 mg total) by mouth 2 (two) times daily.     lisinopril 20 MG tablet  Commonly known as:  PRINIVIL,ZESTRIL  Take 20 mg by mouth daily.     lisinopril 20 MG tablet  Commonly known as:  PRINIVIL,ZESTRIL  Take 1 tablet (20 mg total) by mouth daily.     omeprazole 20 MG capsule  Commonly known as:  PRILOSEC  Take 20 mg by mouth daily.     potassium chloride SA 20 MEQ tablet  Commonly known as:  K-DUR,KLOR-CON  Take 20 mEq by mouth daily.           Follow-up Information    Follow up In 2 weeks.   Why:  For wound re-check      Follow up with CALLWOOD,DWAYNE D., MD. Call in 7 days.   Specialties:  Cardiology, Internal Medicine   Contact information:   [place] [address] [phone]       THE PATIENT  SHALL BRING ALL MEDICATIONS TO FOLLOW UP APPOINTMENT  Signed:  Corey Skains MD, [facility] 03/31/2015, 5:06 PM

## 2015-03-31 NOTE — Progress Notes (Signed)
Patient stated that he was to be discharged today and eager to leave. Dr. Marcello [name] the only MD listed. The only number for this MD is to [facility]. On call [facility] Cardiology Oscar [name]) contacted and stated that he was ok with the patient being discharged but was unable to place D/C orders. On call for [facility] contacted and stated that she knew nothing about pt and did not feel comfortable discharging the pt. Currently attempting to contact Dr Oscar [name] as no MD has rounded on Oscar [name] and pt is eager to be discharged.

## 2015-03-31 NOTE — Progress Notes (Signed)
Initial Nutrition Assessment   INTERVENTION:   Meals and Snacks: Cater to patient preferences Education: pt reports understanding heart healthy nutrition therapy as he has had in the past. RD reviewed importance of compliance on visit.   NUTRITION DIAGNOSIS:   None at this time  GOAL:   Patient will meet greater than or equal to 90% of their needs  MONITOR:    (Energy Intake, Anthropometrics)  REASON FOR ASSESSMENT:   Diagnosis    ASSESSMENT:   Pt s/p ICD implantation yesterday.  Past Medical History  Diagnosis Date  . Hypertension   . GERD (gastroesophageal reflux disease)   . Sleep apnea     osa  . Hypoxemia   . CHF (congestive heart failure)   . Failure in dosage     chronic respiratory   . Dysrhythmia     svt  . Coronary artery disease   . Myocardial infarction     2012,2014,2/16  . Cardiomyopathy     Diet Order:  Diet Heart Room service appropriate?: Yes; Fluid consistency:: Thin    Current Nutrition: Pt reports eating 100% of breakfast and dinner last night  Food/Nutrition-Related History: Pt reports good appetite PTA   Medications: Lasix, Protonix, KCl  Electrolyte/Renal Profile and Glucose Profile:   Recent Labs Lab 03/29/15 1107 03/30/15 1243  NA 135  --   K 4.1 4.5  CL 96*  --   CO2 31  --   BUN 21*  --   CREATININE 0.72  --   CALCIUM 8.6*  --   GLUCOSE 259*  --    Protein Profile: No results for input(s): ALBUMIN in the last 168 hours.  Gastrointestinal Profile: WDL per Nsg Last BM: 03/30/2015   Nutrition-Focused Physical Exam Findings: Nutrition-Focused physical exam completed. Findings are WDL for fat depletion, muscle depletion, and edema.    Weight Change: Pt reports weight fluctuates with fluid acculation PTA; pt states he weighs himself often PTA  Height:   Ht Readings from Last 1 Encounters:  03/30/15 5\' 5"  (1.651 m)    Weight:   Wt Readings from Last 1 Encounters:  03/30/15 347 lb 1.6 oz (157.444 kg)   Wt  Readings from Last 10 Encounters:  03/30/15   347 lb 1.6 oz (157.444 kg)  03/29/15   334 lb (151.501 kg)  12/13/14   333 lb 4.8 oz (151.184 kg)   Ideal Body Weight:   64.5kg  BMI:  Body mass index is 57.76 kg/(m^2).  Estimated Nutritional Needs:   Kcal:  2119-2504kcals, BEE: 1481kcals, TEE: (IF 1.1-1.3)(AF 1.2) using IBW of 64.5kg  Protein:  52-65g protein (0.8-1.0g/kg)  Fluid:  1613-1926mL of fluid (25-34mL/kg)  EDUCATION NEEDS:   No education needs identified at this time    [place], RD, LDN Pager [phone]

## 2015-03-31 NOTE — Anesthesia Postprocedure Evaluation (Signed)
  Anesthesia Post-op Note  Patient: Oscar [name]  Procedure(s) Performed: Procedure(s): ICD LEAD REMOVAL (N/A)  Anesthesia type:General  Patient location: PACU  Post pain: Pain level controlled  Post assessment: Post-op Vital signs reviewed, Patient's Cardiovascular Status Stable, Respiratory Function Stable, Patent Airway and No signs of Nausea or vomiting  Post vital signs: Reviewed and stable  Last Vitals:  Filed Vitals:   03/31/15 0619  BP: 163/102  Pulse: 64  Temp: 36.7 C  Resp: 24    Level of consciousness: awake, alert  and patient cooperative  Complications: No apparent anesthesia complications

## 2015-03-31 NOTE — Progress Notes (Signed)
Per instruction of the charge nurse, the nursing supervisor was called about the pt going unseen and no order for discharge to be placed until tomorrow per Oscar [name]. Awaiting call back from nursing supervisor.

## 2015-04-01 ENCOUNTER — Encounter: Payer: Self-pay | Admitting: Cardiology

## 2015-04-09 ENCOUNTER — Other Ambulatory Visit: Payer: Self-pay

## 2015-04-09 ENCOUNTER — Emergency Department
Admission: EM | Admit: 2015-04-09 | Discharge: 2015-04-09 | Disposition: A | Discharge status: Home / Self Care | Payer: Self-pay | Attending: Emergency Medicine | Admitting: Emergency Medicine

## 2015-04-09 ENCOUNTER — Encounter: Payer: Self-pay | Admitting: Emergency Medicine

## 2015-04-09 DIAGNOSIS — K8500 Idiopathic acute pancreatitis without necrosis or infection: Secondary | ICD-10-CM

## 2015-04-09 DIAGNOSIS — Z720 Tobacco use: Secondary | ICD-10-CM | POA: Insufficient documentation

## 2015-04-09 DIAGNOSIS — I10 Essential (primary) hypertension: Secondary | ICD-10-CM | POA: Insufficient documentation

## 2015-04-09 LAB — CBC
HCT: 48.8 % (ref 40.0–52.0)
HEMOGLOBIN: 16.8 g/dL (ref 13.0–18.0)
MCH: 32.2 pg (ref 26.0–34.0)
MCHC: 34.4 g/dL (ref 32.0–36.0)
MCV: 93.7 fL (ref 80.0–100.0)
Platelets: 164 10*3/uL (ref 150–440)
RBC: 5.21 MIL/uL (ref 4.40–5.90)
RDW: 13.4 % (ref 11.5–14.5)
WBC: 6.7 10*3/uL (ref 3.8–10.6)

## 2015-04-09 LAB — COMPREHENSIVE METABOLIC PANEL
ALT: 39 U/L (ref 17–63)
AST: 28 U/L (ref 15–41)
Albumin: 3.6 g/dL (ref 3.5–5.0)
Alkaline Phosphatase: 187 U/L — ABNORMAL HIGH (ref 38–126)
Anion gap: 8 (ref 5–15)
BILIRUBIN TOTAL: 0.4 mg/dL (ref 0.3–1.2)
BUN: 17 mg/dL (ref 6–20)
CALCIUM: 9.4 mg/dL (ref 8.9–10.3)
CO2: 30 mmol/L (ref 22–32)
Chloride: 92 mmol/L — ABNORMAL LOW (ref 101–111)
Creatinine, Ser: 0.81 mg/dL (ref 0.61–1.24)
GFR calc Af Amer: >60 mL/min (ref >60)
GFR calc non Af Amer: >60 mL/min (ref >60)
Glucose, Bld: 238 mg/dL — ABNORMAL HIGH (ref 65–99)
POTASSIUM: 4 mmol/L (ref 3.5–5.1)
SODIUM: 130 mmol/L — AB (ref 135–145)
Total Protein: 8.4 g/dL — ABNORMAL HIGH (ref 6.5–8.1)

## 2015-04-09 LAB — URINALYSIS COMPLETE WITH MICROSCOPIC (ARMC ONLY)
BACTERIA UA: NONE SEEN
Bilirubin Urine: NEGATIVE
GLUCOSE, UA: 150 mg/dL — AB
Hgb urine dipstick: NEGATIVE
KETONES UR: NEGATIVE mg/dL
NITRITE: NEGATIVE
Protein, ur: >500 mg/dL — AB
Specific Gravity, Urine: 1.024 (ref 1.005–1.030)
pH: 5 (ref 5.0–8.0)

## 2015-04-09 LAB — LIPASE, BLOOD: Lipase: 379 U/L — ABNORMAL HIGH (ref 22–51)

## 2015-04-09 MED ORDER — ONDANSETRON HCL 4 MG PO TABS: 4.0000 mg | ORAL_TABLET | Freq: Every day | ORAL | Status: DC | PRN

## 2015-04-09 MED ORDER — OXYCODONE-ACETAMINOPHEN 5-325 MG PO TABS: 1.0000 | ORAL_TABLET | Freq: Four times a day (QID) | ORAL | Status: DC | PRN

## 2015-04-09 MED ORDER — HYDROMORPHONE HCL 1 MG/ML IJ SOLN
2.0000 mg | Freq: Once | INTRAMUSCULAR | Status: DC
  Filled 2015-04-09: qty 2

## 2015-04-09 MED ORDER — HYDROMORPHONE HCL 1 MG/ML IJ SOLN
2.0000 mg | Freq: Once | INTRAMUSCULAR | Status: AC
  Administered 2015-04-09: 2 mg via INTRAVENOUS

## 2015-04-09 NOTE — Discharge Instructions (Signed)

## 2015-04-09 NOTE — ED Notes (Addendum)
C/o mid abd. Pain since Saturday, hx of pancreatitis, having nausea but denies any v,d, pt states he just had a defib placed on Friday

## 2015-04-09 NOTE — Addendum Note (Signed)
Addendum  created 04/09/15 9735 by Rolla Plate, CRNA   Modules edited: Anesthesia Responsible Staff

## 2015-04-09 NOTE — ED Provider Notes (Signed)
[facility] Emergency Department Provider Note     Time seen: ----------------------------------------- 6:13 PM on 04/09/2015 -----------------------------------------    I have reviewed the triage vital signs and the nursing notes.   HISTORY  Chief Complaint Abdominal Pain    HPI Oscar [name] is a 39 y.o. male who presents ER with abdominal pain. Patient reports severe upper abdominal pain, nothing makes it better or worse. Patient does report a history of same with pancreatitis. [place] fevers chills, vomiting or diarrhea. Also concerned because he has umbilical hernia. Was to be evaluated for that as well.   Past Medical History  Diagnosis Date  . Hypertension   . GERD (gastroesophageal reflux disease)   . Sleep apnea     osa  . Hypoxemia   . CHF (congestive heart failure)   . Failure in dosage     chronic respiratory   . Dysrhythmia     svt  . Coronary artery disease   . Myocardial infarction     2012,2014,2/16  . Cardiomyopathy     Patient Active Problem List   Diagnosis Date Noted  . Chronic systolic heart failure 48/88/9169  . Cardiac pacemaker 03/30/2015  . Chronic respiratory failure with hypercapnia 12/12/2014  . Elevated troponin   . Non-ischemic cardiomyopathy   . Chest pain   . OSA (obstructive sleep apnea)   . Hypoxemia   . Acute combined systolic and diastolic heart failure   . SVT (supraventricular tachycardia) 12/09/2014  . NSTEMI (non-ST elevated myocardial infarction) 12/09/2014  . Hypertension 12/09/2014    Past Surgical History  Procedure Laterality Date  . Left heart catheterization with coronary angiogram N/A 12/09/2014    Procedure: LEFT HEART CATHETERIZATION WITH CORONARY ANGIOGRAM;  Surgeon: Burnell Blanks, MD;  Location: [facility] CATH LAB;  Service: Cardiovascular;  Laterality: N/A;  . Cardiac catheterization  12/11/2014    Procedure: RIGHT/LEFT HEART CATH AND CORONARY ANGIOGRAPHY;  Surgeon: Lorretta Harp, MD;  Location: [facility] CATH LAB;  Service: Cardiovascular;;  . Icd lead removal N/A 03/30/2015    Procedure: ICD LEAD REMOVAL;  Surgeon: Marzetta Board, MD;  Location: [facility] ORS;  Service: Cardiovascular;  Laterality: N/A;  . Implantable cardioverter defibrillator implant      Allergies Review of patient's allergies indicates no known allergies.  Social History History  Substance Use Topics  . Smoking status: Current Every Day Smoker -- 0.50 packs/day    Types: Cigarettes  . Smokeless tobacco: Never Used  . Alcohol Use: No    Review of Systems Constitutional: Negative for fever. Eyes: Negative for visual changes. ENT: Negative for sore throat. Cardiovascular: Negative for chest pain. Respiratory: Negative for shortness of breath. Gastrointestinal: Positive for abdominal pain Genitourinary: Negative for dysuria. Musculoskeletal: Negative for back pain. Skin: Negative for rash. Neurological: Negative for headaches, focal weakness or numbness.  10-point ROS otherwise negative.  ____________________________________________   PHYSICAL EXAM:  VITAL SIGNS: ED Triage Vitals  Enc Vitals Group     BP 04/09/15 1747 166/101 mmHg     Pulse Rate 04/09/15 1747 92     Resp 04/09/15 1747 18     Temp 04/09/15 1747 98 F (36.7 C)     Temp Source 04/09/15 1747 Oral     SpO2 04/09/15 1747 93 %     Weight 04/09/15 1747 336 lb (152.409 kg)     Height 04/09/15 1747 5\' 7"  (1.702 m)     Head Cir --      Peak Flow --  Pain Score 04/09/15 1748 8     Pain Loc --      Pain Edu? --      Excl. in [place]? --     Constitutional: Alert and oriented. Well appearing and in no distress. Eyes: Conjunctivae are normal. PERRL. Normal extraocular movements. ENT   Head: Normocephalic and atraumatic.   Nose: No congestion/rhinnorhea.   Mouth/Throat: Mucous membranes are moist.   Neck: No stridor. Cardiovascular: Normal rate, regular rhythm. Normal and symmetric distal pulses are  present in all extremities. No murmurs, rubs, or gallops. Respiratory: Normal respiratory effort without tachypnea nor retractions. Breath sounds are clear and equal bilaterally. No wheezes/rales/rhonchi. Gastrointestinal: Mild epigastric tenderness, no rebound or guarding. He has reproducible peri- Umbilical hernia. Musculoskeletal: Nontender with normal range of motion in all extremities. No joint effusions.  No lower extremity tenderness nor edema. Neurologic:  Normal speech and language. No gross focal neurologic deficits are appreciated. Speech is normal. No gait instability. Skin:  Skin is warm, dry and intact. No rash noted. Psychiatric: Mood and affect are normal. Speech and behavior are normal. Patient exhibits appropriate insight and judgment. ____________________________________________  EKG: Interpreted by me. Normal sinus rhythm with left axis deviation, LVH with QRS widening. Rate is 95 bpm  ____________________________________________  ED COURSE:  Pertinent labs & imaging results that were available during my care of the patient were reviewed by me and considered in my medical decision making (see chart for details). Patient likely with recurrent or chronic pancreatitis. We'll check abdominal labs to get pain medicine. ____________________________________________    LABS (pertinent positives/negatives)  Labs Reviewed  COMPREHENSIVE METABOLIC PANEL - Abnormal; Notable for the following:    Sodium 130 (*)    Chloride 92 (*)    Glucose, Bld 238 (*)    Total Protein 8.4 (*)    Alkaline Phosphatase 187 (*)    All other components within normal limits  LIPASE, BLOOD - Abnormal; Notable for the following:    Lipase 379 (*)    All other components within normal limits  URINALYSIS COMPLETEWITH MICROSCOPIC ([facility] ONLY) - Abnormal; Notable for the following:    Color, Urine YELLOW (*)    APPearance CLEAR (*)    Glucose, UA 150 (*)    Protein, ur >500 (*)    Leukocytes, UA  TRACE (*)    Squamous Epithelial / LPF 0-5 (*)    All other components within normal limits  CBC    ____________________________________________  FINAL ASSESSMENT AND PLAN  Abdominal pain, pancreatitis  Plan: Patient with labs and imaging as dictated above. Patient states she is feeling better after IV Dilaudid here. Does have pancreatitis by labs, this is idiopathic pancreatitis and that he states he does not drink. He's been seen for this before and admitted in the past. Did offer admission, patient wanted to go home with oral medications. Advised him to follow-up in 48 hours for recheck.   Earleen Newport, MD   Earleen Newport, MD 04/09/15 2013

## 2015-05-17 ENCOUNTER — Ambulatory Visit: Payer: MEDICAID

## 2015-06-07 ENCOUNTER — Other Ambulatory Visit: Payer: MEDICAID

## 2015-06-14 ENCOUNTER — Ambulatory Visit: Payer: MEDICAID

## 2015-07-09 ENCOUNTER — Emergency Department: Payer: Self-pay

## 2015-07-09 ENCOUNTER — Encounter: Payer: Self-pay | Admitting: Emergency Medicine

## 2015-07-09 ENCOUNTER — Inpatient Hospital Stay
Admission: EM | Admit: 2015-07-09 | Discharge: 2015-07-10 | DRG: 291 | Disposition: A | Discharge status: Home Health | Payer: Self-pay | Attending: Internal Medicine | Admitting: Internal Medicine

## 2015-07-09 DIAGNOSIS — I5020 Unspecified systolic (congestive) heart failure: Secondary | ICD-10-CM

## 2015-07-09 DIAGNOSIS — Z9981 Dependence on supplemental oxygen: Secondary | ICD-10-CM

## 2015-07-09 DIAGNOSIS — E662 Morbid (severe) obesity with alveolar hypoventilation: Secondary | ICD-10-CM

## 2015-07-09 DIAGNOSIS — F17210 Nicotine dependence, cigarettes, uncomplicated: Secondary | ICD-10-CM | POA: Diagnosis present

## 2015-07-09 DIAGNOSIS — I509 Heart failure, unspecified: Secondary | ICD-10-CM

## 2015-07-09 DIAGNOSIS — I5023 Acute on chronic systolic (congestive) heart failure: Secondary | ICD-10-CM | POA: Diagnosis present

## 2015-07-09 DIAGNOSIS — Z6841 Body Mass Index (BMI) 40.0 and over, adult: Secondary | ICD-10-CM

## 2015-07-09 DIAGNOSIS — E119 Type 2 diabetes mellitus without complications: Secondary | ICD-10-CM

## 2015-07-09 DIAGNOSIS — R079 Chest pain, unspecified: Secondary | ICD-10-CM

## 2015-07-09 DIAGNOSIS — R748 Abnormal levels of other serum enzymes: Secondary | ICD-10-CM | POA: Diagnosis present

## 2015-07-09 DIAGNOSIS — R778 Other specified abnormalities of plasma proteins: Secondary | ICD-10-CM

## 2015-07-09 DIAGNOSIS — J9621 Acute and chronic respiratory failure with hypoxia: Secondary | ICD-10-CM | POA: Diagnosis present

## 2015-07-09 DIAGNOSIS — G4733 Obstructive sleep apnea (adult) (pediatric): Secondary | ICD-10-CM | POA: Diagnosis present

## 2015-07-09 DIAGNOSIS — R7989 Other specified abnormal findings of blood chemistry: Secondary | ICD-10-CM

## 2015-07-09 DIAGNOSIS — I252 Old myocardial infarction: Secondary | ICD-10-CM

## 2015-07-09 DIAGNOSIS — I2510 Atherosclerotic heart disease of native coronary artery without angina pectoris: Secondary | ICD-10-CM | POA: Diagnosis present

## 2015-07-09 DIAGNOSIS — E785 Hyperlipidemia, unspecified: Secondary | ICD-10-CM | POA: Diagnosis present

## 2015-07-09 DIAGNOSIS — K219 Gastro-esophageal reflux disease without esophagitis: Secondary | ICD-10-CM | POA: Diagnosis present

## 2015-07-09 DIAGNOSIS — I110 Hypertensive heart disease with heart failure: Principal | ICD-10-CM | POA: Diagnosis present

## 2015-07-09 LAB — COMPREHENSIVE METABOLIC PANEL
ALBUMIN: 3.3 g/dL — AB (ref 3.5–5.0)
ALK PHOS: 126 U/L (ref 38–126)
ALT: 37 U/L (ref 17–63)
AST: 36 U/L (ref 15–41)
Anion gap: 4 — ABNORMAL LOW (ref 5–15)
BUN: 13 mg/dL (ref 6–20)
CALCIUM: 8 mg/dL — AB (ref 8.9–10.3)
CO2: 33 mmol/L — AB (ref 22–32)
Chloride: 99 mmol/L — ABNORMAL LOW (ref 101–111)
Creatinine, Ser: 0.81 mg/dL (ref 0.61–1.24)
GFR calc Af Amer: >60 mL/min (ref >60)
GFR calc non Af Amer: >60 mL/min (ref >60)
GLUCOSE: 277 mg/dL — AB (ref 65–99)
POTASSIUM: 3.9 mmol/L (ref 3.5–5.1)
Sodium: 136 mmol/L (ref 135–145)
Total Bilirubin: 0.6 mg/dL (ref 0.3–1.2)
Total Protein: 7.4 g/dL (ref 6.5–8.1)

## 2015-07-09 LAB — CBC WITH DIFFERENTIAL/PLATELET
BASOS ABS: 0 10*3/uL (ref 0–0.1)
Basophils Relative: 1 %
Eosinophils Absolute: 0.1 10*3/uL (ref 0–0.7)
Eosinophils Relative: 2 %
HCT: 47.2 % (ref 40.0–52.0)
HEMOGLOBIN: 16 g/dL (ref 13.0–18.0)
LYMPHS ABS: 2.1 10*3/uL (ref 1.0–3.6)
LYMPHS PCT: 38 %
MCH: 32.9 pg (ref 26.0–34.0)
MCHC: 33.8 g/dL (ref 32.0–36.0)
MCV: 97.2 fL (ref 80.0–100.0)
Monocytes Absolute: 0.3 10*3/uL (ref 0.2–1.0)
Monocytes Relative: 5 %
NEUTROS PCT: 54 %
Neutro Abs: 3 10*3/uL (ref 1.4–6.5)
Platelets: 153 10*3/uL (ref 150–440)
RBC: 4.86 MIL/uL (ref 4.40–5.90)
RDW: 13.4 % (ref 11.5–14.5)
WBC: 5.6 10*3/uL (ref 3.8–10.6)

## 2015-07-09 LAB — BLOOD GAS, VENOUS
ACID-BASE EXCESS: 3.6 mmol/L — AB (ref 0.0–3.0)
BICARBONATE: 31.4 meq/L — AB (ref 21.0–28.0)
PATIENT TEMPERATURE: 37
PCO2 VEN: 61 mmHg — AB (ref 44.0–60.0)
pH, Ven: 7.32 (ref 7.320–7.430)

## 2015-07-09 LAB — GLUCOSE, CAPILLARY
Glucose-Capillary: 285 mg/dL — ABNORMAL HIGH (ref 65–99)
Glucose-Capillary: 234 mg/dL — ABNORMAL HIGH (ref 65–99)
Glucose-Capillary: 221 mg/dL — ABNORMAL HIGH (ref 65–99)

## 2015-07-09 LAB — HEMOGLOBIN A1C: HEMOGLOBIN A1C: 10.3 % — AB (ref 4.0–6.0)

## 2015-07-09 LAB — TROPONIN I: Troponin I: 0.12 ng/mL — ABNORMAL HIGH (ref <0.031)

## 2015-07-09 LAB — BRAIN NATRIURETIC PEPTIDE: B Natriuretic Peptide: 128 pg/mL — ABNORMAL HIGH (ref 0.0–100.0)

## 2015-07-09 LAB — LIPASE, BLOOD: LIPASE: 47 U/L (ref 11–51)

## 2015-07-09 MED ORDER — ENOXAPARIN SODIUM 40 MG/0.4ML ~~LOC~~ SOLN
40.0000 mg | Freq: Two times a day (BID) | SUBCUTANEOUS | Status: DC
Start: 2015-07-09 — End: 2015-07-10
  Administered 2015-07-09 – 2015-07-10 (×3): 40 mg via SUBCUTANEOUS
  Filled 2015-07-09 (×3): qty 0.4

## 2015-07-09 MED ORDER — ASPIRIN EC 325 MG PO TBEC
325.0000 mg | DELAYED_RELEASE_TABLET | Freq: Every day | ORAL | Status: DC
  Administered 2015-07-10: 325 mg via ORAL
  Filled 2015-07-09: qty 1

## 2015-07-09 MED ORDER — ATORVASTATIN CALCIUM 20 MG PO TABS
80.0000 mg | ORAL_TABLET | Freq: Every day | ORAL | Status: DC
  Administered 2015-07-09: 80 mg via ORAL
  Filled 2015-07-09: qty 4

## 2015-07-09 MED ORDER — HYDRALAZINE HCL 25 MG PO TABS
25.0000 mg | ORAL_TABLET | Freq: Two times a day (BID) | ORAL | Status: DC
  Administered 2015-07-09 – 2015-07-10 (×2): 25 mg via ORAL
  Filled 2015-07-09 (×2): qty 1

## 2015-07-09 MED ORDER — CARVEDILOL 6.25 MG PO TABS
6.2500 mg | ORAL_TABLET | Freq: Two times a day (BID) | ORAL | Status: DC
  Administered 2015-07-09 – 2015-07-10 (×2): 6.25 mg via ORAL
  Filled 2015-07-09 (×2): qty 1

## 2015-07-09 MED ORDER — SODIUM CHLORIDE 0.9 % IJ SOLN: 3.0000 mL | INTRAMUSCULAR | Status: DC | PRN

## 2015-07-09 MED ORDER — POTASSIUM CHLORIDE CRYS ER 20 MEQ PO TBCR
20.0000 meq | EXTENDED_RELEASE_TABLET | Freq: Every day | ORAL | Status: DC
  Administered 2015-07-09 – 2015-07-10 (×2): 20 meq via ORAL
  Filled 2015-07-09 (×2): qty 1

## 2015-07-09 MED ORDER — PANTOPRAZOLE SODIUM 40 MG PO TBEC
40.0000 mg | DELAYED_RELEASE_TABLET | Freq: Every day | ORAL | Status: DC
  Administered 2015-07-10: 40 mg via ORAL
  Filled 2015-07-09: qty 1

## 2015-07-09 MED ORDER — FUROSEMIDE 10 MG/ML IJ SOLN
20.0000 mg | Freq: Three times a day (TID) | INTRAMUSCULAR | Status: DC
  Administered 2015-07-09 – 2015-07-10 (×4): 20 mg via INTRAVENOUS
  Filled 2015-07-09 (×4): qty 2

## 2015-07-09 MED ORDER — ONDANSETRON HCL 4 MG/2ML IJ SOLN: 4.0000 mg | Freq: Four times a day (QID) | INTRAMUSCULAR | Status: DC | PRN

## 2015-07-09 MED ORDER — LISINOPRIL 20 MG PO TABS
20.0000 mg | ORAL_TABLET | Freq: Every day | ORAL | Status: DC
  Administered 2015-07-10: 20 mg via ORAL
  Filled 2015-07-09: qty 1

## 2015-07-09 MED ORDER — INSULIN ASPART 100 UNIT/ML ~~LOC~~ SOLN
0.0000 [IU] | Freq: Three times a day (TID) | SUBCUTANEOUS | Status: DC
  Administered 2015-07-09: 8 [IU] via SUBCUTANEOUS
  Administered 2015-07-09 – 2015-07-10 (×2): 5 [IU] via SUBCUTANEOUS
  Administered 2015-07-10: 8 [IU] via SUBCUTANEOUS
  Filled 2015-07-09 (×2): qty 8
  Filled 2015-07-09: qty 5

## 2015-07-09 MED ORDER — SODIUM CHLORIDE 0.9 % IJ SOLN
3.0000 mL | Freq: Two times a day (BID) | INTRAMUSCULAR | Status: DC
  Administered 2015-07-09 – 2015-07-10 (×2): 3 mL via INTRAVENOUS

## 2015-07-09 MED ORDER — ACETAMINOPHEN 325 MG PO TABS
650.0000 mg | ORAL_TABLET | ORAL | Status: DC | PRN
  Administered 2015-07-10: 650 mg via ORAL
  Filled 2015-07-09: qty 2

## 2015-07-09 MED ORDER — SODIUM CHLORIDE 0.9 % IV SOLN: 250.0000 mL | INTRAVENOUS | Status: DC | PRN

## 2015-07-09 NOTE — Progress Notes (Signed)
Inpatient Diabetes Program Recommendations  AACE/ADA: New Consensus Statement on Inpatient Glycemic Control (2015)  Target Ranges:  Prepandial:   less than 140 mg/dL      Peak postprandial:   less than 180 mg/dL (1-2 hours)      Critically ill patients:  140 - 180 mg/dL   Review of Glycemic Control  Diabetes history: none noted- A1C 8.% on 12/11/14 Outpatient Diabetes medications: none Current orders for Inpatient glycemic control: Novolog 0-15 units tid  Inpatient Diabetes Program Recommendations: A1C in progress. Agree with Novolog 0-15 units tid.  Consider adding Novolog 0-5 units qhs.    Will follow.    Gentry Fitz, RN, BA, MHA, CDE Diabetes Coordinator Inpatient Diabetes Program  [phone] (Team Pager) [phone] ([place]) 07/09/2015 2:27 PM

## 2015-07-09 NOTE — ED Notes (Signed)
Doll to call back for report; she is in isolation room

## 2015-07-09 NOTE — Care Management (Signed)
Patient presented to ED with shortness of breath and cough.  Patient had ICD placement July 2016 and has EF of 25%.  Patient says he needs home 02.  Lowest room air sat in the ED was 89%.  Spoke with charge nurse to request aggressive assessment of need for home 02.  Patient has home 02 about "6-8" months ago "but they came and got it because I could not pay for it.  Unsure of the name of the company.  DME companies do not usually remove life saving medical equipment due to inability to pay if it is still needed.  Patient is followed by [facility] - is current and in good standing.  Able to obtain his meds.  At present, he is sitting on his bed and ambulating around the bed without 02

## 2015-07-09 NOTE — Progress Notes (Signed)
2 L of oxygen. NSR. Takes meds ok. FS are stable. IV lasix for CHF. Trop positive. A & O. Pt has not reported any pain. Pt has no further concerns at this time.

## 2015-07-09 NOTE — Progress Notes (Signed)
Clinical Education officer, museum (CSW) received consult for "Ethical Issues". Per RN patient can't afford home oxygen. RN Case Manager aware of above. Please reconsult if future social work needs arise. CSW signing off.   Blima Rich, [place] [phone]

## 2015-07-09 NOTE — ED Notes (Signed)
Pt reports that he has been feeling sob and having epigastric pain x 10 days, worsening today. States that sob is worse with exertion and when ling down. Pt alert & oriented. Placed on 2L [place] for sats at 90%.

## 2015-07-09 NOTE — ED Provider Notes (Signed)
[facility] Emergency Department Provider Note     Time seen: ----------------------------------------- 7:36 AM on 07/09/2015 -----------------------------------------    I have reviewed the triage vital signs and the nursing notes.   HISTORY  Chief Complaint Shortness of Breath    HPI Oscar [name] is a 39 y.o. male who presents ER for shortness of breath, chest pain and cough 3 days. Patient had been on oxygen continuously in the past, does not have oxygen currently because of Medicaid problems. He presents oxygen saturation is 89% having chest pain as well. Patient states his symptoms been bothering him for several days, is also having abdominal pain consistent with pancreatitis.  Past Medical History  Diagnosis Date  . Hypertension   . GERD (gastroesophageal reflux disease)   . Sleep apnea     osa  . Hypoxemia   . CHF (congestive heart failure) ([place])   . Failure in dosage     chronic respiratory   . Dysrhythmia     svt  . Coronary artery disease   . Myocardial infarction ([place])     6606,3016,0/10  . Cardiomyopathy [facility])     Patient Active Problem List   Diagnosis Date Noted  . Chronic systolic heart failure ([place]) 03/30/2015  . Cardiac pacemaker 03/30/2015  . Chronic respiratory failure with hypercapnia ([place]) 12/12/2014  . Elevated troponin   . Non-ischemic cardiomyopathy ([place])   . Chest pain   . OSA (obstructive sleep apnea)   . Hypoxemia   . Acute combined systolic and diastolic heart failure ([place])   . SVT (supraventricular tachycardia) ([place]) 12/09/2014  . NSTEMI (non-ST elevated myocardial infarction) ([place]) 12/09/2014  . Hypertension 12/09/2014    Past Surgical History  Procedure Laterality Date  . Left heart catheterization with coronary angiogram N/A 12/09/2014    Procedure: LEFT HEART CATHETERIZATION WITH CORONARY ANGIOGRAM;  Surgeon: Burnell Blanks, MD;  Location: [facility] CATH LAB;  Service: Cardiovascular;  Laterality:  N/A;  . Cardiac catheterization  12/11/2014    Procedure: RIGHT/LEFT HEART CATH AND CORONARY ANGIOGRAPHY;  Surgeon: Lorretta Harp, MD;  Location: [facility] CATH LAB;  Service: Cardiovascular;;  . Icd lead removal N/A 03/30/2015    Procedure: ICD LEAD REMOVAL;  Surgeon: Marzetta Board, MD;  Location: [facility] ORS;  Service: Cardiovascular;  Laterality: N/A;  . Implantable cardioverter defibrillator implant      Allergies Review of patient's allergies indicates no known allergies.  Social History Social History  Substance Use Topics  . Smoking status: Current Every Day Smoker -- 0.50 packs/day    Types: Cigarettes  . Smokeless tobacco: Never Used  . Alcohol Use: No    Review of Systems Constitutional: Negative for fever. Eyes: Negative for visual changes. ENT: Negative for sore throat. Cardiovascular: Positive for chest pain Respiratory: Positive for shortness of breath Gastrointestinal: Positive for abdominal pain, negative for vomiting and diarrhea Genitourinary: Negative for dysuria. Musculoskeletal: Negative for back pain. Skin: Negative for rash. Neurological: Negative for headaches, positive for weakness  10-point ROS otherwise negative.  ____________________________________________   PHYSICAL EXAM:  VITAL SIGNS: ED Triage Vitals  Enc Vitals Group     BP 07/09/15 0727 161/106 mmHg     Pulse Rate 07/09/15 0727 93     Resp 07/09/15 0727 18     Temp 07/09/15 0727 98.3 F (36.8 C)     Temp Source 07/09/15 0727 Oral     SpO2 07/09/15 0727 89 %     Weight 07/09/15 0727 343 lb (155.584 kg)  Height 07/09/15 0727 5\' 6"  (1.676 m)     Head Cir --      Peak Flow --      Pain Score 07/09/15 0732 5     Pain Loc --      Pain Edu? --      Excl. in [place]? --     Constitutional: Alert and oriented. Mild distress, patient seems drowsy Eyes: Conjunctivae are normal. PERRL. Normal extraocular movements. ENT   Head: Normocephalic and atraumatic.   Nose: No  congestion/rhinnorhea.   Mouth/Throat: Mucous membranes are moist.   Neck: No stridor. Cardiovascular: Normal rate, regular rhythm. Normal and symmetric distal pulses are present in all extremities. No murmurs, rubs, or gallops. Respiratory: Normal respiratory effort without tachypnea nor retractions. Breath sounds are clear and equal bilaterally.  Gastrointestinal: Soft and nontender. No distention. No abdominal bruits.  Musculoskeletal: Nontender with normal range of motion in all extremities. No joint effusions.  No lower extremity tenderness nor edema. Neurologic:  Normal speech and language. No gross focal neurologic deficits are appreciated. Speech is normal. No gait instability. Skin:  Skin is warm, dry and intact. No rash noted. Psychiatric: Mood and affect are normal. Speech and behavior are normal. Patient exhibits appropriate insight and judgment. ____________________________________________  EKG: Interpreted by me. Normal sinus rhythm with a rate of 98 bpm, normal PR interval, widened QRS, long QT interval. Left axis deviation, LVH with repolarization abnormalities  ____________________________________________  ED COURSE:  Pertinent labs & imaging results that were available during my care of the patient were reviewed by me and considered in my medical decision making (see chart for details). Patient well-known to me, will check basic labs and x-rays. Generally he is in poor health. ____________________________________________    LABS (pertinent positives/negatives)  Labs Reviewed  BRAIN NATRIURETIC PEPTIDE - Abnormal; Notable for the following:    B Natriuretic Peptide 128.0 (*)    All other components within normal limits  TROPONIN I - Abnormal; Notable for the following:    Troponin I 0.12 (*)    All other components within normal limits  COMPREHENSIVE METABOLIC PANEL - Abnormal; Notable for the following:    Chloride 99 (*)    CO2 33 (*)    Glucose, Bld 277  (*)    Calcium 8.0 (*)    Albumin 3.3 (*)    Anion gap 4 (*)    All other components within normal limits  BLOOD GAS, VENOUS - Abnormal; Notable for the following:    pCO2, Ven 61 (*)    Bicarbonate 31.4 (*)    Acid-Base Excess 3.6 (*)    All other components within normal limits  CBC WITH DIFFERENTIAL/PLATELET  LIPASE, BLOOD    RADIOLOGY  Chest x-ray IMPRESSION: Evidence of a degree of congestive heart failure. No airspace consolidation. ____________________________________________  FINAL ASSESSMENT AND PLAN  Chest pain, dyspnea, abdominal pain, hypercarbia, elevated troponin  Plan: Patient with labs and imaging as dictated above. No clear cause for the abdominal pain. Chest pain and shortness of breath is likely acute on chronic. He's supposed to be on home oxygen however is not. He may would benefit from observation to ensure we can arrange home oxygen, serial troponins to ensure his troponin is not going up as well as occasional BiPAP.   Earleen Newport, MD   Earleen Newport, MD 07/09/15 [phone]

## 2015-07-09 NOTE — ED Notes (Signed)
Pt presents with shortness of breath and  Cough for three days. Pt is supposed to be on oxygen continuous, but medicaid has not gone through. Pt is currently 89% on room and c/o chest pain as well.

## 2015-07-09 NOTE — ED Notes (Signed)
Critical lab value: troponin 0.12; reported to Dr. Jimmye Norman.

## 2015-07-09 NOTE — H&P (Signed)
[facility] [place] NAME: Oscar [name]    MR#:  [id number]  DATE OF BIRTH:  1976/06/18  DATE OF ADMISSION:  07/09/2015  PRIMARY CARE PHYSICIAN: PROVIDER NOT IN SYSTEM   REQUESTING/REFERRING PHYSICIAN: dr Jimmye Norman   increasing shortness of breath for 3 days. HISTORY OF PRESENT ILLNESS:  Oscar [name]  is a 39 y.o. male with a known history of nonischemic cardiomyopathy EF of 25% with normal heart catheterization in April 2016 status post AICD placement in April 2016, morbid obesity, hypertension comes to the emergency room with increasing shortness of breath for 2-3 days.   patient reports drinking a significant amount of regular Coke. He was found to have sats 87-89% on room air. Patient was on home oxygen how her about 6 months ago was taken off of it since his Medicaid has not kicked in. In the emergency room patient was found to be in congestive heart failure acute on chronic systolic. He received IV Lasix. He is being admitted for further evaluation management.  PAST MEDICAL HISTORY:   Past Medical History  Diagnosis Date  . Hypertension   . GERD (gastroesophageal reflux disease)   . Sleep apnea     osa  . Hypoxemia   . CHF (congestive heart failure) ([place])   . Failure in dosage     chronic respiratory   . Dysrhythmia     svt  . Coronary artery disease   . Myocardial infarction ([place])     [id number],4496,7/59  . Cardiomyopathy ([place])     PAST SURGICAL HISTOIRY:   Past Surgical History  Procedure Laterality Date  . Left heart catheterization with coronary angiogram N/A 12/09/2014    Procedure: LEFT HEART CATHETERIZATION WITH CORONARY ANGIOGRAM;  Surgeon: Burnell Blanks, MD;  Location: [facility] CATH LAB;  Service: Cardiovascular;  Laterality: N/A;  . Cardiac catheterization  12/11/2014    Procedure: RIGHT/LEFT HEART CATH AND CORONARY ANGIOGRAPHY;  Surgeon: Lorretta Harp, MD;  Location: [facility] CATH LAB;  Service:  Cardiovascular;;  . Icd lead removal N/A 03/30/2015    Procedure: ICD LEAD REMOVAL;  Surgeon: Marzetta Board, MD;  Location: [facility] ORS;  Service: Cardiovascular;  Laterality: N/A;  . Implantable cardioverter defibrillator implant      SOCIAL HISTORY:   Social History  Substance Use Topics  . Smoking status: Current Every Day Smoker -- 0.50 packs/day    Types: Cigarettes  . Smokeless tobacco: Never Used  . Alcohol Use: No    FAMILY HISTORY:  No known cardiovascular or pulmonary disorder.   DRUG ALLERGIES:  No Known Allergies  REVIEW OF SYSTEMS:  Review of Systems  Constitutional: Negative for fever, chills and weight loss.  HENT: Negative for ear discharge, ear pain and nosebleeds.   Eyes: Negative for blurred vision, pain and discharge.  Respiratory: Positive for cough and shortness of breath. Negative for sputum production, wheezing and stridor.   Cardiovascular: Negative for chest pain, palpitations, orthopnea and PND.  Gastrointestinal: Negative for nausea, vomiting, abdominal pain and diarrhea.  Genitourinary: Negative for urgency and frequency.  Musculoskeletal: Negative for back pain and joint pain.  Neurological: Positive for weakness. Negative for sensory change, speech change and focal weakness.  Psychiatric/Behavioral: Negative for depression. The patient is not nervous/anxious.   All other systems reviewed and are negative.    MEDICATIONS AT HOME:   Prior to Admission medications   Medication Sig Start Date End Date Taking? Authorizing Provider  aspirin EC 325 MG tablet  Take 325 mg by mouth daily.   Yes Historical Provider, MD  furosemide (LASIX) 20 MG tablet Take 20 mg by mouth daily.   Yes Historical Provider, MD  hydrALAZINE (APRESOLINE) 25 MG tablet Take 25 mg by mouth 2 (two) times daily.   Yes Historical Provider, MD  lisinopril (PRINIVIL,ZESTRIL) 20 MG tablet Take 20 mg by mouth daily.   Yes Historical Provider, MD  omeprazole (PRILOSEC) 20 MG capsule Take  20 mg by mouth daily.   Yes Historical Provider, MD  potassium chloride SA (K-DUR,KLOR-CON) 20 MEQ tablet Take 20 mEq by mouth daily.   Yes Historical Provider, MD  atorvastatin (LIPITOR) 80 MG tablet Take 1 tablet (80 mg total) by mouth daily at 6 PM. 03/31/15   Corey Skains, MD  carvedilol (COREG) 6.25 MG tablet Take 1 tablet (6.25 mg total) by mouth 2 (two) times daily with a meal. 03/31/15   Corey Skains, MD      VITAL SIGNS:  Blood pressure 155/107, pulse 81, temperature 98.3 F (36.8 C), temperature source Oral, resp. rate 13, height 5\' 6"  (1.676 m), weight 155.584 kg (343 lb), SpO2 96 %.  PHYSICAL EXAMINATION:  GENERAL:  39 y.o.-year-old patient lying in the bed with no acute distress.  morbidly obese EYES: Pupils equal, round, reactive to light and accommodation. No scleral icterus. Extraocular muscles intact.  HEENT: Head atraumatic, normocephalic. Oropharynx and nasopharynx clear.  NECK:  Supple, no jugular venous distention. No thyroid enlargement, no tenderness.  LUNGdecreasedbreath sounds bilaterally, no wheezing, rales,rhonchi or crepitation. No use of accessory muscles of respiration.  CARDIOVASCULAR: S1, S2 normal. No murmurs, rubs, or gallops.  ABDOMEN: Soft, nontender, nondistended. Bowel sounds present. No organomegaly or mass.  EXTREMITIES:+ pedal edema, no cyanosis, or clubbing.  NEUROLOGIC: Cranial nerves II through XII are intact. Muscle strength 5/5 in all extremities. Sensation intact. Gait not checked.  PSYCHIATRIC: The patient is alert and oriented x 3.  SKIN: No obvious rash, lesion, or ulcer.   LABORATORY PANEL:   CBC  Recent Labs Lab 07/09/15 0755  WBC 5.6  HGB 16.0  HCT 47.2  PLT 153   ------------------------------------------------------------------------------------------------------------------  Chemistries   Recent Labs Lab 07/09/15 0755  NA 136  K 3.9  CL 99*  CO2 33*  GLUCOSE 277*  BUN 13  CREATININE 0.81  CALCIUM 8.0*   AST 36  ALT 37  ALKPHOS 126  BILITOT 0.6   ------------------------------------------------------------------------------------------------------------------  Cardiac Enzymes  Recent Labs Lab 07/09/15 0755  TROPONINI 0.12*   ------------------------------------------------------------------------------------------------------------------  RADIOLOGY:  Dg Chest 2 View  07/09/2015  CLINICAL DATA:  Three-day history of shortness of breath and cough EXAM: CHEST  2 VIEW COMPARISON:  December 09, 2014 FINDINGS: There is interstitial edema without appreciable airspace consolidation. There is cardiomegaly with pulmonary venous hypertension. Pacemaker leads are attached to right atrium and right ventricle. No adenopathy. No pneumothorax. Slight anterior wedging of several thoracic vertebral bodies is noted, age uncertain. IMPRESSION: Evidence of a degree of congestive heart failure. No airspace consolidation. Electronically Signed   By: Lowella Grip III M.D.   On: 07/09/2015 08:19    EKG:   NSR, LVH, no acute ST-T changes IMPRESSION AND PLAN:    39 year old African-American gentleman with history of nonischemic cardiac myopathy EF of 25%, hypertension, hyperlipidemia, obstructive sleep apnea comes into the emergency room with increasing shortness of breath he was found to have    1. Acute on chronic hypoxic respiratory failure secondary to acute on chronic systolic congestive heart failure.  EF 25% by echo and cardiac catheter done in 2016 Admit to medical floor with telemetry 2 g sodium diet IV Lasix 20 mg 3 times a day Daily weights, I's and O's, monitor creatinine Continue lisinopril, Coreg, potassium Patient advised on not drinking regular Coke in excessive amounts. We'll recommend him for outpatient CHF clinic.  2. Hyper glycemia suspect underlying diabetes Check A1c We'll place patient on glycemic control with subcutaneous insulin when necessary Start on by mouth meds if A1c is  elevated  3. Hypertension Resumed Coreg, lisinopril, hydralazine  4. Hyperlipidemia We'll resume statins  5. Morbid obesity with history of sleep apnea We'll get care management involved to see patient will be eligible for home oxygen and/or CPAP  6. DVT prophylaxis subcutaneous Lovenox No family members in the emergency room  7. Tobacco abuse patient counseled on smoking cessation about 4 minutes spent.  All the records are reviewed and case discussed with ED provider. Management plans discussed with the patient, family and they are in agreement.  CODE STATUS: Full  TOTAL TIME TAKING CARE OF THIS PATIENT: 55  minutes.    Oscar [name] M.D on 07/09/2015 at 10:47 AM  Between 7am to 6pm - Pager - [phone]  After 6pm go to [email or web address] - password [email or web address] [facility]  Office  [phone]  CC: Primary care physician; PROVIDER NOT IN SYSTEM

## 2015-07-10 DIAGNOSIS — E119 Type 2 diabetes mellitus without complications: Secondary | ICD-10-CM

## 2015-07-10 LAB — BASIC METABOLIC PANEL
Anion gap: 7 (ref 5–15)
BUN: 12 mg/dL (ref 6–20)
CALCIUM: 8.4 mg/dL — AB (ref 8.9–10.3)
CO2: 34 mmol/L — AB (ref 22–32)
CREATININE: 0.79 mg/dL (ref 0.61–1.24)
Chloride: 96 mmol/L — ABNORMAL LOW (ref 101–111)
Glucose, Bld: 240 mg/dL — ABNORMAL HIGH (ref 65–99)
Potassium: 4.1 mmol/L (ref 3.5–5.1)
Sodium: 137 mmol/L (ref 135–145)

## 2015-07-10 LAB — TROPONIN I: TROPONIN I: 0.08 ng/mL — AB (ref <0.031)

## 2015-07-10 LAB — GLUCOSE, CAPILLARY
Glucose-Capillary: 254 mg/dL — ABNORMAL HIGH (ref 65–99)
Glucose-Capillary: 208 mg/dL — ABNORMAL HIGH (ref 65–99)

## 2015-07-10 MED ORDER — OMEPRAZOLE 20 MG PO CPDR: 20.0000 mg | DELAYED_RELEASE_CAPSULE | Freq: Every day | ORAL | Status: DC

## 2015-07-10 MED ORDER — METFORMIN HCL 1000 MG PO TABS: 1000.0000 mg | ORAL_TABLET | Freq: Two times a day (BID) | ORAL | Status: DC

## 2015-07-10 MED ORDER — INSULIN STARTER KIT- SYRINGES (ENGLISH): 1.0000 | Freq: Once | Status: DC

## 2015-07-10 MED ORDER — POTASSIUM CHLORIDE CRYS ER 20 MEQ PO TBCR: 20.0000 meq | EXTENDED_RELEASE_TABLET | Freq: Every day | ORAL | Status: DC

## 2015-07-10 MED ORDER — INSULIN GLARGINE 100 UNIT/ML ~~LOC~~ SOLN
15.0000 [IU] | Freq: Every day | SUBCUTANEOUS | Status: DC
  Administered 2015-07-10: 15 [IU] via SUBCUTANEOUS
  Filled 2015-07-10: qty 0.15

## 2015-07-10 MED ORDER — FUROSEMIDE 20 MG PO TABS: 20.0000 mg | ORAL_TABLET | Freq: Every day | ORAL | Status: DC

## 2015-07-10 MED ORDER — HYDRALAZINE HCL 25 MG PO TABS: 25.0000 mg | ORAL_TABLET | Freq: Two times a day (BID) | ORAL | Status: DC

## 2015-07-10 MED ORDER — INSULIN GLARGINE 100 UNIT/ML ~~LOC~~ SOLN: 10.0000 [IU] | Freq: Every day | SUBCUTANEOUS | Status: DC

## 2015-07-10 MED ORDER — LIVING WELL WITH DIABETES BOOK
Freq: Once | Status: AC
  Administered 2015-07-10: 16:00:00
  Filled 2015-07-10: qty 1

## 2015-07-10 MED ORDER — ATORVASTATIN CALCIUM 80 MG PO TABS
80.0000 mg | ORAL_TABLET | Freq: Every day | ORAL | Status: DC
Start: 2015-07-10 — End: 2015-12-25

## 2015-07-10 MED ORDER — ATORVASTATIN CALCIUM 80 MG PO TABS: 80.0000 mg | ORAL_TABLET | Freq: Every day | ORAL | Status: DC

## 2015-07-10 MED ORDER — CARVEDILOL 6.25 MG PO TABS: 6.2500 mg | ORAL_TABLET | Freq: Two times a day (BID) | ORAL | Status: DC

## 2015-07-10 MED ORDER — LISINOPRIL 20 MG PO TABS: 20.0000 mg | ORAL_TABLET | Freq: Every day | ORAL | Status: DC

## 2015-07-10 MED ORDER — ASPIRIN EC 325 MG PO TBEC: 325.0000 mg | DELAYED_RELEASE_TABLET | Freq: Every day | ORAL | Status: DC

## 2015-07-10 MED ORDER — LISINOPRIL 20 MG PO TABS
20.0000 mg | ORAL_TABLET | Freq: Every day | ORAL | Status: DC
Start: 2015-07-10 — End: 2015-08-21

## 2015-07-10 MED ORDER — INSULIN STARTER KIT- SYRINGES (ENGLISH)
1.0000 | Freq: Once | Status: AC
  Administered 2015-07-10: 1
  Filled 2015-07-10: qty 1

## 2015-07-10 NOTE — Care Management (Signed)
Patient is requesting discharge today per attending. Found out from  [facility] that if he complies with appointments, Dr Gwynneth Aliment will sign orders  for home health.  Made patient an appointment with [facility] Nov 10 7pm.  [facility] has assessed and approved for charity care for home health nurse and home 02.  Patient is discharging home on new insulin injections and glucose monitoring.  Nursing is aware that patient needs to be able to prepare and administer insuline and perform fingerstick blood sugars prior to discharge.   Compared discharge meds with meds on H/P.  The only new meds are the insulin and metoformin. Patient says that he has supply of all of his previous home meds Contacted apothecary to check  to see if  Lantus can be filled under [facility].  Informed the cost of the med warrants [facility].  Contacted the clinic and there is no Lantus in stock.  Made [facility] referral for the Lantus only and instructed on the 3 dollar copay. [facility] will instruct on the participating pharmacies. Family to transport home

## 2015-07-10 NOTE — Care Management (Signed)
Spoke with representative from [facility].  Informed that patient is most definitely not compliant with his appointments.  he is a frequent no show- no call for scheduled appointments.  Will speak with patient about conflicting information.  At present, patient's exertional sats are 87%.  Asked for [facility] to assess for charity care/dme.  Questioned [facility] as to whether CM can set up home health under [facility] attending

## 2015-07-10 NOTE — Progress Notes (Signed)
Patient given living with diabetes booklet, hypo and hyperglycemia discussed with patient, page 15 also marked for patient. Patient was able to demonstrate administering shot and have to drawn up liquid in insulin syringe. Insulin started kit also given and discussed with patient. Glucometer was also given to patient, patient and family instructed on how to set up lancet and work glucometer. Patient and family verbalized that they understood and felt comfortable with checking and administering insulin [place]

## 2015-07-10 NOTE — Care Management (Signed)
Patient enrolled in [facility] program for patient to obtain his first 30 days for Lantus for $3.  Writer called [facility] on [address] in [place].  Pharmacy stated that they were not familiar with the [facility] program, however it shouldn't be an issue to process.  I have processed [facility] prescriptions previously with [facility] on [address].  I provided the patient with the written script for Lantus, M[facility] assistance card, and list of participating pharmacies.  [facility] of pharmacies include [facility] [address], and [facility] on [address].

## 2015-07-10 NOTE — Progress Notes (Signed)
Discharge instructions along with home medication list and follow up gone over with patient and sister. Patient was given printed prescriptions. Nann and stephanie the case managers have discussed obtaining the medications with the patient and sister. Both verbalized that they understood instructions. Personal 02 tank delivered and at bedside. Iv and telemetry removed. Patient to be discharged home with home health. No distress noted.

## 2015-07-10 NOTE — Discharge Planning (Signed)
Appointment has been made to follow up in the  [facility] on 07/16/15 at 10 AM. If you have any questions or concerns please contact the office at [phone].

## 2015-07-10 NOTE — Progress Notes (Signed)
Inpatient Diabetes Program Recommendations  AACE/ADA: New Consensus Statement on Inpatient Glycemic Control (2015)  Target Ranges:  Prepandial:   less than 140 mg/dL      Peak postprandial:   less than 180 mg/dL (1-2 hours)      Critically ill patients:  140 - 180 mg/dL   Review of Glycemic Control Results for JAHRED, TATAR (MRN [id number]) as of 07/10/2015 08:01  Ref. Range 07/09/2015 12:36 07/09/2015 16:16 07/09/2015 20:33 07/10/2015 07:25  Glucose-Capillary Latest Ref Range: 65-99 mg/dL 285 (H) 234 (H) 221 (H) 254 (H)    Diabetes history: none noted- A1C 8.0 % on 12/11/14   10.3% on 07/09/15 Outpatient Diabetes medications: none Current orders for Inpatient glycemic control: Novolog 0-15 units tid  Inpatient Diabetes Program Recommendations: A1C in progress. Agree with Novolog 0-15 units tid. Consider starting the patient on Lantus 16 units q day beginning this am- he is a patient of the [facility] and will have access to medication at [facility]. Consider adding Novolog 0-5 units qhs.   Staff- Once he has been told he has diabetes, please develop a care plan and instruct patient on insulin administration (insulin pen/syringe teaching kits are available in the orders- no co-sign required). Allow him to give himself his own insulin each time it is given.  Please review hypo/hyperglycemia. Please instruct him on meter use and the  importance of checking his blood sugars. Will need a dietitian consult once he has been diagnosed.    Will continue to follow.  Gentry Fitz, RN, BA, MHA, CDE Diabetes Coordinator Inpatient Diabetes Program  [phone] (Team Pager) [phone] ([place]) 07/10/2015 8:08 AM

## 2015-07-10 NOTE — Discharge Instructions (Signed)
[facility]  Thursday NOV 10 7 PM  [facility] - TO SEE 07/11/2015-----[phone]

## 2015-07-10 NOTE — Progress Notes (Signed)
SATURATION QUALIFICATIONS: (This note is used to comply with regulatory documentation for home oxygen)  Patient Saturations on Room Air at Rest = 92%  Patient Saturations on Room Air while Ambulating = 87%  Patient Saturations on  2 Liters of oxygen while Ambulating = 95%  Please briefly explain why patient needs home oxygen: 

## 2015-07-10 NOTE — Progress Notes (Signed)
Inpatient Diabetes Program Recommendations  AACE/ADA: New Consensus Statement on Inpatient Glycemic Control (2015)  Target Ranges:  Prepandial:   less than 140 mg/dL      Peak postprandial:   less than 180 mg/dL (1-2 hours)      Critically ill patients:  140 - 180 mg/dL  Met with the patient, his sister and niece to discuss diabetes.  I have called the [facility] and they have Lantus vials available for him.  I have ordered the insulin teaching kit and Living Well with Diabetes book- both will come up through pharmacy and should be given to him before discharge.  Instructed on the drawing and administering of insulin.  Discussed storage, site rotation and expiration of insulin.  Instructed on where to pick up insulin ([facility]).  Using the teach back method, the patient was able to successfully demonstrate the use of the insulin.    I discussed the plate method of eating- I have asked him to include a protein and 4 carb servings per meal, 1 protein and 1 carb serving per meal (with examples).  It will difficult for this patient to follow through; his meal tray was on the bedside table and included an empty 20 oz regular soda, a Snicker's wrapper and an empty Cheetos bag.  Gentry Fitz, RN, BA, MHA, CDE Diabetes Coordinator Inpatient Diabetes Program  [phone] (Team Pager) [phone] ([place]) 07/10/2015 2:51 PM

## 2015-07-12 ENCOUNTER — Ambulatory Visit: Payer: MEDICAID

## 2015-07-13 NOTE — Discharge Summary (Signed)
[facility] [place] NAME: Oscar [name]    MR#:  [id number]  DATE OF BIRTH:  15-Sep-1975  DATE OF ADMISSION:  07/09/2015 ADMITTING PHYSICIAN: Fritzi Mandes, MD  DATE OF DISCHARGE: 07/10/2015  PRIMARY CARE PHYSICIAN: PROVIDER NOT IN SYSTEM    ADMISSION DIAGNOSIS:  Obesity hypoventilation syndrome ([place]) [E66.2] Elevated troponin I level [R79.89] Chest pain, unspecified chest pain type [R07.9]  DISCHARGE DIAGNOSIS:  Active Problems:   CHF (congestive heart failure), NYHA class III ([place])   Diabetes ([place])   SECONDARY DIAGNOSIS:   Past Medical History  Diagnosis Date  . Hypertension   . GERD (gastroesophageal reflux disease)   . Sleep apnea     osa  . Hypoxemia   . CHF (congestive heart failure) ([place])   . Failure in dosage     chronic respiratory   . Dysrhythmia     svt  . Coronary artery disease   . Myocardial infarction ([place])     [id number],7/78  . 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 Acute on chronic hypoxic respiratory failure secondary to acute on chronic systolic congestive heart failure. EF 25% by echo and cardiac catheter done in 2016.  He admits to non compliance with diet and medications. Has a scale but does not weigh. He knows that he could weigh and prevent exacerbation. He received CHF education in hospital from me. He has an appointment at CHF clinic for further assistance.  He responded well to diuresis and was up walking in the halls without distress. He did have some desaturations and 02 was ordered, but he did not seem like he was planning to use it even though it was encouraged. Refills for all medications were given. CM worked with him on  medication access. Follow up made with PCP.  2. Hyper glycemia suspect underlying diabetes: A1C >10. Poor control. Education on diet and lifestyle provided. Particularly stopping soda. He was started on long acting insulin and metformin. Given glucometer, strips, parameters. He does not like injections so does not seem very willing to comply. Follow up with PCP scheduled.  3. Hypertension: Controlled. Resumed Coreg, lisinopril, hydralazine  4. Hyperlipidemia: resume statins  5. Morbid obesity with history of sleep apnea: Home 02 ordered. He needs a sleep study in outpatient setting. Weight loss counseling provided.  6. Tobacco abuse patient counseled on smoking cessation daily   DISCHARGE CONDITIONS:   fair  CONSULTS OBTAINED:    None  DRUG ALLERGIES:  No Known Allergies  DISCHARGE MEDICATIONS:   Discharge Medication List as of 07/10/2015  4:14 PM    CONTINUE these medications which have CHANGED   Details  aspirin EC 325 MG tablet Take 1 tablet (325 mg total) by mouth daily., Starting 07/10/2015, Until Discontinued, Print    atorvastatin (LIPITOR) 80 MG tablet Take 1 tablet (80 mg total) by mouth daily at 6 PM., Starting 07/10/2015, Until Discontinued, Print    carvedilol (COREG) 6.25 MG tablet Take 1 tablet (6.25 mg total) by mouth 2 (two) times daily with a meal., Starting 07/10/2015, Until Discontinued, Print    furosemide (LASIX) 20 MG tablet Take 1 tablet (20 mg total) by mouth daily., Starting 07/10/2015, Until Discontinued, Print    hydrALAZINE (APRESOLINE) 25  MG tablet Take 1 tablet (25 mg total) by mouth 2 (two) times daily., Starting 07/10/2015, Until Discontinued, Print    insulin glargine (LANTUS) 100 UNIT/ML injection Inject 0.1 mLs (10 Units total) into the skin daily., Starting 07/10/2015, Until Discontinued, Print    insulin starter kit- syringes MISC 1 kit by Other route once., Starting 07/10/2015, Print    lisinopril (PRINIVIL,ZESTRIL) 20 MG tablet Take 1 tablet  (20 mg total) by mouth daily., Starting 07/10/2015, Until Discontinued, Print    metFORMIN (GLUCOPHAGE) 1000 MG tablet Take 1 tablet (1,000 mg total) by mouth 2 (two) times daily with a meal., Starting 07/10/2015, Until Discontinued, Print    omeprazole (PRILOSEC) 20 MG capsule Take 1 capsule (20 mg total) by mouth daily., Starting 07/10/2015, Until Discontinued, Print    potassium chloride SA (K-DUR,KLOR-CON) 20 MEQ tablet Take 1 tablet (20 mEq total) by mouth daily., Starting 07/10/2015, Until Discontinued, Print         DISCHARGE INSTRUCTIONS:    See AVS  If you experience worsening of your admission symptoms, develop shortness of breath, life threatening emergency, suicidal or homicidal thoughts you must seek medical attention immediately by calling 911 or calling your MD immediately  if symptoms less severe.  You Must read complete instructions/literature along with all the possible adverse reactions/side effects for all the Medicines you take and that have been prescribed to you. Take any new Medicines after you have completely understood and accept all the possible adverse reactions/side effects.   Please note  You were cared for by a hospitalist during your hospital stay. If you have any questions about your discharge medications or the care you received while you were in the hospital after you are discharged, you can call the unit and asked to speak with the hospitalist on call if the hospitalist that took care of you is not available. Once you are discharged, your primary care physician will handle any further medical issues. Please note that NO REFILLS for any discharge medications will be authorized once you are discharged, as it is imperative that you return to your primary care physician (or establish a relationship with a primary care physician if you do not have one) for your aftercare needs so that they can reassess your need for medications and monitor your lab  values.    Today   CHIEF COMPLAINT:   Chief Complaint  Patient presents with  . Shortness of Breath    HISTORY OF PRESENT ILLNESS:  Oscar [name] is a 39 y.o. male with a known history of nonischemic cardiomyopathy EF of 25% with normal heart catheterization in April 2016 status post AICD placement in April 2016, morbid obesity, hypertension comes to the emergency room with increasing shortness of breath for 2-3 days.  patient reports drinking a significant amount of regular Coke. He was found to have sats 87-89% on room air. Patient was on home oxygen how her about 6 months ago was taken off of it since his Medicaid has not kicked in. In the emergency room patient was found to be in congestive heart failure acute on chronic systolic. He received IV Lasix. He is being admitted for further evaluation management.   VITAL SIGNS:  Blood pressure 111/72, pulse 82, temperature 97.6 F (36.4 C), temperature source Oral, resp. rate 20, height _0  (1.676 m), weight 157.353 kg (346 lb 14.4 oz), SpO2 93 %.  I/O:  No intake or output data in the 24 hours ending 07/13/15 0759  PHYSICAL EXAMINATION:  GENERAL:  39 y.o.-year-old patient sitting up, no distress , obese LUNGS: Normal breath sounds bilaterally, no wheezing, rales,rhonchi or crepitation. No use of accessory muscles of respiration.  CARDIOVASCULAR: S1, S2 normal. No murmurs, rubs, or gallops. distant ABDOMEN: Soft, non-tender, non-distended. Bowel sounds present. No organomegaly or mass.  EXTREMITIES: No pedal edema, cyanosis, or clubbing.  NEUROLOGIC: Cranial nerves II through XII are intact. Muscle strength 5/5 in all extremities. Sensation intact. Gait not checked.  PSYCHIATRIC: The patient is alert and oriented x 3.  SKIN: No obvious rash, lesion, or ulcer.   DATA REVIEW:   CBC  Recent Labs Lab 07/09/15 0755  WBC 5.6  HGB 16.0  HCT 47.2  PLT 153    Chemistries   Recent Labs Lab 07/09/15 0755 07/10/15 0455   NA 136 137  K 3.9 4.1  CL 99* 96*  CO2 33* 34*  GLUCOSE 277* 240*  BUN 13 12  CREATININE 0.81 0.79  CALCIUM 8.0* 8.4*  AST 36  --   ALT 37  --   ALKPHOS 126  --   BILITOT 0.6  --     Cardiac Enzymes  Recent Labs Lab 07/10/15 1058  TROPONINI 0.08*    Microbiology Results  Results for orders placed or performed during the hospital encounter of 12/09/14  MRSA PCR Screening     Status: None   Collection Time: 12/09/14 11:08 PM  Result Value Ref Range Status   MRSA by PCR NEGATIVE NEGATIVE Final    Comment:        The GeneXpert MRSA Assay (FDA approved for NASAL specimens only), is one component of a comprehensive MRSA colonization surveillance program. It is not intended to diagnose MRSA infection nor to guide or monitor treatment for MRSA infections.     RADIOLOGY:  No results found.  EKG:   Orders placed or performed during the hospital encounter of 07/09/15  . EKG 12-Lead  . EKG 12-Lead  . ED EKG  . ED EKG  . EKG      Management plans discussed with the patient, family and they are in agreement.  CODE STATUS: full  TOTAL TIME TAKING CARE OF THIS PATIENT: 50 minutes.  Greater than 50% of time spent in care coordination and counseling.  Myrtis Ser M.D on 07/13/2015 at 7:59 AM  Between 7am to 6pm - Pager - [phone]  After 6pm go to [email or web address] - password [email or web address] [facility]  Office  [phone]  CC: Primary care physician; PROVIDER NOT IN SYSTEM

## 2015-07-16 ENCOUNTER — Telehealth: Payer: Self-pay | Admitting: Family

## 2015-07-16 ENCOUNTER — Ambulatory Visit: Payer: Self-pay | Admitting: Family

## 2015-07-16 NOTE — Telephone Encounter (Signed)
Patient did not show for his initial appointment at the [facility] on 07/16/15. Will attempt to reschedule.

## 2015-07-18 ENCOUNTER — Ambulatory Visit: Payer: Self-pay | Admitting: Family

## 2015-07-31 ENCOUNTER — Encounter: Payer: Self-pay | Admitting: Family

## 2015-07-31 ENCOUNTER — Ambulatory Visit: Payer: Self-pay | Attending: Family | Admitting: Family

## 2015-07-31 VITALS — BP 121/77 | HR 84 | Resp 18 | Ht 67.0 in | Wt 338.0 lb

## 2015-07-31 DIAGNOSIS — I498 Other specified cardiac arrhythmias: Secondary | ICD-10-CM | POA: Insufficient documentation

## 2015-07-31 DIAGNOSIS — Z9889 Other specified postprocedural states: Secondary | ICD-10-CM | POA: Insufficient documentation

## 2015-07-31 DIAGNOSIS — I429 Cardiomyopathy, unspecified: Secondary | ICD-10-CM | POA: Insufficient documentation

## 2015-07-31 DIAGNOSIS — G4733 Obstructive sleep apnea (adult) (pediatric): Secondary | ICD-10-CM | POA: Insufficient documentation

## 2015-07-31 DIAGNOSIS — Z7984 Long term (current) use of oral hypoglycemic drugs: Secondary | ICD-10-CM | POA: Insufficient documentation

## 2015-07-31 DIAGNOSIS — I2510 Atherosclerotic heart disease of native coronary artery without angina pectoris: Secondary | ICD-10-CM | POA: Insufficient documentation

## 2015-07-31 DIAGNOSIS — Z794 Long term (current) use of insulin: Secondary | ICD-10-CM | POA: Insufficient documentation

## 2015-07-31 DIAGNOSIS — F17210 Nicotine dependence, cigarettes, uncomplicated: Secondary | ICD-10-CM | POA: Insufficient documentation

## 2015-07-31 DIAGNOSIS — Z7982 Long term (current) use of aspirin: Secondary | ICD-10-CM | POA: Insufficient documentation

## 2015-07-31 DIAGNOSIS — Z79899 Other long term (current) drug therapy: Secondary | ICD-10-CM | POA: Insufficient documentation

## 2015-07-31 DIAGNOSIS — R0602 Shortness of breath: Secondary | ICD-10-CM | POA: Insufficient documentation

## 2015-07-31 DIAGNOSIS — I5022 Chronic systolic (congestive) heart failure: Secondary | ICD-10-CM | POA: Insufficient documentation

## 2015-07-31 DIAGNOSIS — E119 Type 2 diabetes mellitus without complications: Secondary | ICD-10-CM | POA: Insufficient documentation

## 2015-07-31 DIAGNOSIS — I252 Old myocardial infarction: Secondary | ICD-10-CM | POA: Insufficient documentation

## 2015-07-31 DIAGNOSIS — I10 Essential (primary) hypertension: Secondary | ICD-10-CM | POA: Insufficient documentation

## 2015-07-31 DIAGNOSIS — K219 Gastro-esophageal reflux disease without esophagitis: Secondary | ICD-10-CM | POA: Insufficient documentation

## 2015-07-31 DIAGNOSIS — Z720 Tobacco use: Secondary | ICD-10-CM | POA: Insufficient documentation

## 2015-07-31 NOTE — Patient Instructions (Signed)
Continue weighing daily and call for an overnight weight gain of > 2 pounds or a weekly weight gain of >5 pounds. 

## 2015-07-31 NOTE — Progress Notes (Signed)
Subjective:    Patient ID: Oscar [name], male    DOB: 1976/02/17, 39 y.o.   MRN: [id number]  Congestive Heart Failure Presents for initial visit. The disease course has been stable. Associated symptoms include chest pain (over defibrillator site), fatigue and shortness of breath. Pertinent negatives include no abdominal pain, edema or palpitations. The symptoms have been stable. Past treatments include ACE inhibitors, beta blockers and salt and fluid restriction. The treatment provided moderate relief. Compliance with prior treatments has been good. Prior compliance problems include medication issues. His past medical history is significant for CAD and HTN. Compliance with total regimen is 76-100%.  Other This is a chronic (fatigue) problem. The current episode started more than 1 month ago. The problem occurs constantly. The problem has been unchanged. Associated symptoms include chest pain (over defibrillator site), fatigue and headaches (in mornings until medications are taken). Pertinent negatives include no abdominal pain, congestion, coughing, neck pain, sore throat, visual change or weakness. The symptoms are aggravated by exertion. He has tried sleep for the symptoms. The treatment provided no relief.  Hypertension This is a chronic problem. The current episode started more than 1 year ago. The problem has been gradually improving since onset. Associated symptoms include chest pain (over defibrillator site), headaches (in mornings until medications are taken) and shortness of breath. Pertinent negatives include no neck pain, palpitations, peripheral edema or PND. There are no associated agents to hypertension. Risk factors for coronary artery disease include diabetes mellitus, dyslipidemia, male gender, obesity and smoking/tobacco exposure. Past treatments include diuretics, lifestyle changes, beta blockers and ACE inhibitors. The current treatment provides moderate improvement. Compliance  problems include medication cost.  Hypertensive end-organ damage includes CAD/MI and heart failure.    Past Medical History  Diagnosis Date  . Hypertension   . GERD (gastroesophageal reflux disease)   . Sleep apnea     osa  . Hypoxemia   . CHF (congestive heart failure) ([place])   . Failure in dosage     chronic respiratory   . Dysrhythmia     svt  . Coronary artery disease   . Myocardial infarction ([place])     9030,0923,3/00  . Cardiomyopathy [facility])     Past Surgical History  Procedure Laterality Date  . Left heart catheterization with coronary angiogram N/A 12/09/2014    Procedure: LEFT HEART CATHETERIZATION WITH CORONARY ANGIOGRAM;  Surgeon: Burnell Blanks, MD;  Location: [facility] CATH LAB;  Service: Cardiovascular;  Laterality: N/A;  . Cardiac catheterization  12/11/2014    Procedure: RIGHT/LEFT HEART CATH AND CORONARY ANGIOGRAPHY;  Surgeon: Lorretta Harp, MD;  Location[facility] CATH LAB;  Service: Cardiovascular;;  . Icd lead removal N/A 03/30/2015    Procedure: ICD LEAD REMOVAL;  Surgeon: Marzetta Board, MD;  Location: [facility] ORS;  Service: Cardiovascular;  Laterality: N/A;  . Implantable cardioverter defibrillator implant    . Insert / replace / remove pacemaker      Family History  Problem Relation Age of Onset  . Hypertension Mother   . Hypertension Sister   . Diabetes Sister   . Anemia Neg Hx   . Arrhythmia Neg Hx   . Asthma Neg Hx   . Clotting disorder Neg Hx   . Fainting Neg Hx   . Heart attack Neg Hx   . Heart disease Neg Hx   . Heart failure Neg Hx   . Hyperlipidemia Neg Hx      Social History  Substance Use Topics  . Smoking status:  Current Every Day Smoker -- 0.50 packs/day for 24 years    Types: Cigarettes  . Smokeless tobacco: Never Used  . Alcohol Use: No    No Known Allergies  Prior to Admission medications   Medication Sig Start Date End Date Taking? Authorizing Provider  aspirin EC 325 MG tablet Take 1 tablet (325 mg total) by mouth daily.  07/10/15  Yes Aldean Jewett, MD  atorvastatin (LIPITOR) 80 MG tablet Take 1 tablet (80 mg total) by mouth daily at 6 PM. 07/10/15  Yes Aldean Jewett, MD  carvedilol (COREG) 6.25 MG tablet Take 1 tablet (6.25 mg total) by mouth 2 (two) times daily with a meal. 07/10/15  Yes Aldean Jewett, MD  furosemide (LASIX) 20 MG tablet Take 1 tablet (20 mg total) by mouth daily. 07/10/15  Yes Aldean Jewett, MD  hydrALAZINE (APRESOLINE) 25 MG tablet Take 1 tablet (25 mg total) by mouth 2 (two) times daily. 07/10/15  Yes Aldean Jewett, MD  insulin glargine (LANTUS) 100 UNIT/ML injection Inject 0.1 mLs (10 Units total) into the skin daily. 07/10/15  Yes Aldean Jewett, MD  insulin starter kit- syringes MISC 1 kit by Other route once. 07/10/15  Yes Aldean Jewett, MD  lisinopril (PRINIVIL,ZESTRIL) 20 MG tablet Take 1 tablet (20 mg total) by mouth daily. 07/10/15  Yes Aldean Jewett, MD  metFORMIN (GLUCOPHAGE) 1000 MG tablet Take 1 tablet (1,000 mg total) by mouth 2 (two) times daily with a meal. 07/10/15  Yes Aldean Jewett, MD  omeprazole (PRILOSEC) 20 MG capsule Take 1 capsule (20 mg total) by mouth daily. 07/10/15  Yes Aldean Jewett, MD  potassium chloride SA (K-DUR,KLOR-CON) 20 MEQ tablet Take 1 tablet (20 mEq total) by mouth daily. 07/10/15  Yes Aldean Jewett, MD     Review of Systems  Constitutional: Positive for fatigue. Negative for appetite change.  HENT: Negative for congestion, postnasal drip and sore throat.   Eyes: Negative for pain and visual disturbance.  Respiratory: Positive for shortness of breath and wheezing. Negative for cough and chest tightness.   Cardiovascular: Positive for chest pain (over defibrillator site). Negative for palpitations, leg swelling and PND.  Gastrointestinal: Negative for abdominal pain and abdominal distention.  Endocrine: Negative.   Genitourinary: Negative.   Musculoskeletal: Positive for neck stiffness. Negative for back pain and neck  pain.  Skin: Negative.   Allergic/Immunologic: Negative.   Neurological: Positive for headaches (in mornings until medications are taken). Negative for dizziness, weakness and light-headedness.  Hematological: Negative for adenopathy. Bruises/bleeds easily.  Psychiatric/Behavioral: Negative for sleep disturbance and dysphoric mood. The patient is not nervous/anxious.        Objective:   Physical Exam  Constitutional: He is oriented to person, place, and time. He appears well-developed and well-nourished.  HENT:  Head: Normocephalic and atraumatic.  Eyes: Conjunctivae are normal. Pupils are equal, round, and reactive to light.  Neck: Normal range of motion. Neck supple.  Cardiovascular: Normal rate and regular rhythm.   Pulmonary/Chest: Effort normal. He has no wheezes. He has no rales.  Abdominal: Soft. He exhibits no distension. There is no tenderness.  Musculoskeletal: He exhibits no edema or tenderness.  Neurological: He is alert and oriented to person, place, and time.  Skin: Skin is warm and dry.  Psychiatric: He has a normal mood and affect. His behavior is normal. Thought content normal.  Nursing note and vitals reviewed.   BP 121/77 mmHg  Pulse 84  Resp 18  Ht 5' 7"  (1.702 m)  Wt 338 lb (153.316 kg)  BMI 52.93 kg/m2  SpO2 97%       Assessment & Plan:  1: Chronic heart failure with reduced ejection fraction- Patient presents with fatigue and shortness of breath upon exertion. He says that his biggest concern is of how tired he feels "all the time". He says that his breathing improves after he sits and rests for a few minutes but the fatigue never goes away. He does wear oxygen while he's exerting himself as well as at bedtime at 2L. He denies having any edema in his legs/abdomen. He has been weighing himself every day and says that his weight has been stable. Discussed the importance of calling for an overnight weight gain of >2 pounds or a weekly weight gain of >5  pounds. He says that he's not adding any salt to his food. Discussed the importance of reading food labels so that he can stay under 203m sodium daily. Written information was given to him about this as well. He's not sure if he received the flu vaccine so encouraged him to follow up with [facility] about this. Discussed titrating up his carvedilol, adding bidil and changing his lisinopril to entresto.  2: Diabetes- Patient says that his glucose this morning was 158 but that it's usually lower than that. He thinks it was higher because he drank a large glass of orange juice. Discussed with him that fruit and fruit juice can have quite a bit of sugar in them. Follows with [facility] about his diabetes. 3: Obstructive sleep apnea- Patient says that he had a sleep study done about 4 months ago but has never heard any results. He says that he does snore and that he's tired "all the time". He has to take a nap during the day but he's just as tired. When he wakes up in the morning after sleeping all night, he doesn't feel rested. Will have the office followup regarding this.  4: Tobacco use- Patient continues to smoke and says that he smokes about 4-5 cigarettes daily and is working on quitting completely. Complete cessation was discussed for 3 minutes with the patient.   Return in 1 month or sooner for any questions/problems before then.

## 2015-08-01 ENCOUNTER — Encounter: Payer: Self-pay | Admitting: Family

## 2015-08-06 ENCOUNTER — Other Ambulatory Visit: Payer: Self-pay | Admitting: Family

## 2015-08-06 MED ORDER — SYRINGE (DISPOSABLE) 1 ML MISC: 0.1000 mL | Status: DC

## 2015-08-06 MED ORDER — INSULIN GLARGINE 100 UNIT/ML ~~LOC~~ SOLN: 10.0000 [IU] | Freq: Every day | SUBCUTANEOUS | Status: DC

## 2015-08-14 ENCOUNTER — Emergency Department: Payer: Self-pay

## 2015-08-14 ENCOUNTER — Emergency Department
Admission: EM | Admit: 2015-08-14 | Discharge: 2015-08-14 | Disposition: A | Discharge status: Home / Self Care | Payer: Self-pay | Attending: Emergency Medicine | Admitting: Emergency Medicine

## 2015-08-14 ENCOUNTER — Encounter: Payer: Self-pay | Admitting: Medical Oncology

## 2015-08-14 DIAGNOSIS — Z79899 Other long term (current) drug therapy: Secondary | ICD-10-CM | POA: Insufficient documentation

## 2015-08-14 DIAGNOSIS — E119 Type 2 diabetes mellitus without complications: Secondary | ICD-10-CM | POA: Insufficient documentation

## 2015-08-14 DIAGNOSIS — Z950 Presence of cardiac pacemaker: Secondary | ICD-10-CM | POA: Insufficient documentation

## 2015-08-14 DIAGNOSIS — W208XXA Other cause of strike by thrown, projected or falling object, initial encounter: Secondary | ICD-10-CM | POA: Insufficient documentation

## 2015-08-14 DIAGNOSIS — E785 Hyperlipidemia, unspecified: Secondary | ICD-10-CM | POA: Insufficient documentation

## 2015-08-14 DIAGNOSIS — Z7984 Long term (current) use of oral hypoglycemic drugs: Secondary | ICD-10-CM | POA: Insufficient documentation

## 2015-08-14 DIAGNOSIS — F17210 Nicotine dependence, cigarettes, uncomplicated: Secondary | ICD-10-CM | POA: Insufficient documentation

## 2015-08-14 DIAGNOSIS — Y9289 Other specified places as the place of occurrence of the external cause: Secondary | ICD-10-CM | POA: Insufficient documentation

## 2015-08-14 DIAGNOSIS — Q845 Enlarged and hypertrophic nails: Secondary | ICD-10-CM | POA: Insufficient documentation

## 2015-08-14 DIAGNOSIS — Z8679 Personal history of other diseases of the circulatory system: Secondary | ICD-10-CM | POA: Insufficient documentation

## 2015-08-14 DIAGNOSIS — S9031XA Contusion of right foot, initial encounter: Secondary | ICD-10-CM | POA: Insufficient documentation

## 2015-08-14 DIAGNOSIS — Y998 Other external cause status: Secondary | ICD-10-CM | POA: Insufficient documentation

## 2015-08-14 DIAGNOSIS — Z794 Long term (current) use of insulin: Secondary | ICD-10-CM | POA: Insufficient documentation

## 2015-08-14 DIAGNOSIS — I10 Essential (primary) hypertension: Secondary | ICD-10-CM | POA: Insufficient documentation

## 2015-08-14 DIAGNOSIS — Y9389 Activity, other specified: Secondary | ICD-10-CM | POA: Insufficient documentation

## 2015-08-14 DIAGNOSIS — Z7982 Long term (current) use of aspirin: Secondary | ICD-10-CM | POA: Insufficient documentation

## 2015-08-14 MED ORDER — KETOROLAC TROMETHAMINE 60 MG/2ML IM SOLN
60.0000 mg | Freq: Once | INTRAMUSCULAR | Status: AC
  Administered 2015-08-14: 60 mg via INTRAMUSCULAR
  Filled 2015-08-14: qty 2

## 2015-08-14 MED ORDER — TRAMADOL HCL 50 MG PO TABS: 50.0000 mg | ORAL_TABLET | Freq: Four times a day (QID) | ORAL | Status: DC | PRN

## 2015-08-14 NOTE — ED Notes (Signed)
AAOx3.  Skin warm and dry.  NAD 

## 2015-08-14 NOTE — Discharge Instructions (Signed)
Foot Contusion  A foot contusion is a deep bruise to the foot. Contusions happen when an injury causes bleeding under the skin. Signs of bruising include pain, puffiness (swelling), and discolored skin. The contusion may turn blue, purple, or yellow. HOME CARE  Put ice on the injured area.  Put ice in a plastic bag.  Place a towel between your skin and the bag.  Leave the ice on for 15-20 minutes, 03-04 times a day.  Only take medicines as told by your doctor.  Use an elastic wrap only as told. You may remove the wrap for sleeping, showering, and bathing. Take the wrap off if you lose feeling (numb) in your toes, or they turn blue or cold. Put the wrap on more loosely.  Keep the foot raised (elevated) with pillows.  If your foot hurts, avoid standing or walking.  When your doctor says it is okay to use your foot, start using it slowly. If you have pain, lessen how much you use your foot.  See your doctor as told. GET HELP RIGHT AWAY IF:   You have more redness, puffiness, or pain in your foot.  Your puffiness or pain does not get better with medicine.  You lose feeling in your foot, or you cannot move your toes.  Your foot turns cold or blue.  You have pain when you move your toes.  Your foot feels warm.  Your contusion does not get better in 2 days. MAKE SURE YOU:   Understand these instructions.  Will watch this condition.  Will get help right away if you or your child is not doing well or gets worse.   This information is not intended to replace advice given to you by your health care provider. Make sure you discuss any questions you have with your health care provider.   Document Released: 05/27/2008 Document Revised: 02/17/2012 Document Reviewed: 04/24/2015 Elsevier Interactive Patient Education [employer].

## 2015-08-14 NOTE — ED Notes (Signed)
States he dropped a hammer on right foot 2 days ago. conts to have pain min swelling

## 2015-08-14 NOTE — ED Notes (Signed)
Pt reports that he dropped a hammer on the top of his rt foot 2 days ago and since has been having pain.

## 2015-08-14 NOTE — ED Provider Notes (Signed)
[facility] Emergency Department Provider Note  ____________________________________________  Time seen: Approximately 11:49 AM  I have reviewed the triage vital signs and the nursing notes.   HISTORY  Chief Complaint Foot Injury    HPI Oscar [name] is a 39 y.o. male patient complained of pain to the dorsal aspect of the right foot. Patient stated 2 days ago he dropped a hammer on the right foot. Patient continued head pain and mild swelling to the dorsal aspect of the right foot. Patient was is able to ambulate without difficulty. Patient rates his pain as 8/10 and describes pain as sharp.Patient also state he is diabetic and has hypertension and has a pacemaker. No palliative measures taken for this complaint.   Past Medical History  Diagnosis Date  . Hypertension   . GERD (gastroesophageal reflux disease)   . Sleep apnea     osa  . Hypoxemia   . CHF (congestive heart failure) ([place])   . Failure in dosage     chronic respiratory   . Dysrhythmia     svt  . Coronary artery disease   . Myocardial infarction ([place])     5784,6962,9/52  . Cardiomyopathy ([place])   . Diabetes mellitus without complication ([place])   . Hyperlipidemia     Patient Active Problem List   Diagnosis Date Noted  . Obstructive sleep apnea 07/31/2015  . Tobacco use 07/31/2015  . Diabetes ([place]) 07/10/2015  . Chronic systolic heart failure ([place]) 03/30/2015  . Cardiac pacemaker 03/30/2015  . Chronic respiratory failure with hypercapnia ([place]) 12/12/2014  . Elevated troponin   . Non-ischemic cardiomyopathy ([place])   . Hypoxemia   . SVT (supraventricular tachycardia) ([place]) 12/09/2014  . NSTEMI (non-ST elevated myocardial infarction) ([place]) 12/09/2014  . Hypertension 12/09/2014    Past Surgical History  Procedure Laterality Date  . Left heart catheterization with coronary angiogram N/A 12/09/2014    Procedure: LEFT HEART CATHETERIZATION WITH CORONARY ANGIOGRAM;  Surgeon: Burnell Blanks, MD;  Location: [facility] CATH LAB;  Service: Cardiovascular;  Laterality: N/A;  . Cardiac catheterization  12/11/2014    Procedure: RIGHT/LEFT HEART CATH AND CORONARY ANGIOGRAPHY;  Surgeon: Lorretta Harp, MD;  Location: [facility] CATH LAB;  Service: Cardiovascular;;  . Icd lead removal N/A 03/30/2015    Procedure: ICD LEAD REMOVAL;  Surgeon: Marzetta Board, MD;  Location: [facility] ORS;  Service: Cardiovascular;  Laterality: N/A;  . Implantable cardioverter defibrillator implant    . Insert / replace / remove pacemaker      Current Outpatient Rx  Name  Route  Sig  Dispense  Refill  . aspirin EC 325 MG tablet   Oral   Take 1 tablet (325 mg total) by mouth daily.   30 tablet   0   . atorvastatin (LIPITOR) 80 MG tablet   Oral   Take 1 tablet (80 mg total) by mouth daily at 6 PM.   90 tablet   2   . carvedilol (COREG) 6.25 MG tablet   Oral   Take 1 tablet (6.25 mg total) by mouth 2 (two) times daily with a meal.   90 tablet   2   . furosemide (LASIX) 20 MG tablet   Oral   Take 1 tablet (20 mg total) by mouth daily.   30 tablet   0   . hydrALAZINE (APRESOLINE) 25 MG tablet   Oral   Take 1 tablet (25 mg total) by mouth 2 (two) times daily.   60 tablet  0   . insulin glargine (LANTUS) 100 UNIT/ML injection   Subcutaneous   Inject 0.1 mLs (10 Units total) into the skin daily.   10 mL   11   . insulin starter kit- syringes MISC   Other   1 kit by Other route once.   1 kit   0   . lisinopril (PRINIVIL,ZESTRIL) 20 MG tablet   Oral   Take 1 tablet (20 mg total) by mouth daily.   30 tablet   0   . metFORMIN (GLUCOPHAGE) 1000 MG tablet   Oral   Take 1 tablet (1,000 mg total) by mouth 2 (two) times daily with a meal.   60 tablet   0   . omeprazole (PRILOSEC) 20 MG capsule   Oral   Take 1 capsule (20 mg total) by mouth daily.   30 capsule   0   . potassium chloride SA (K-DUR,KLOR-CON) 20 MEQ tablet   Oral   Take 1 tablet (20 mEq total) by mouth daily.   30  tablet   0   . Syringe, Disposable, 1 ML MISC   Does not apply   0.1 mLs by Does not apply route as directed.   60 each   3   . traMADol (ULTRAM) 50 MG tablet   Oral   Take 1 tablet (50 mg total) by mouth every 6 (six) hours as needed for moderate pain.   12 tablet   0     Allergies Review of patient's allergies indicates no known allergies.  Family History  Problem Relation Age of Onset  . Hypertension Mother   . Hypertension Sister   . Diabetes Sister   . Anemia Neg Hx   . Arrhythmia Neg Hx   . Asthma Neg Hx   . Clotting disorder Neg Hx   . Fainting Neg Hx   . Heart attack Neg Hx   . Heart disease Neg Hx   . Heart failure Neg Hx   . Hyperlipidemia Neg Hx     Social History Social History  Substance Use Topics  . Smoking status: Current Every Day Smoker -- 0.50 packs/day for 24 years    Types: Cigarettes  . Smokeless tobacco: Never Used  . Alcohol Use: No    Review of Systems Constitutional: No fever/chills Eyes: No visual changes. ENT: No sore throat. Cardiovascular: Denies chest pain. History of cardiomyopathy, congestive heart failure and dysrhythmia. Respiratory: Denies shortness of breath. Gastrointestinal: No abdominal pain.  No nausea, no vomiting.  No diarrhea.  No constipation. Genitourinary: Negative for dysuria. Musculoskeletal: Right dorsal foot pain Skin: Negative for rash. Neurological: Negative for headaches, focal weakness or numbness. Endocrine:Hypertension, hyperlipidemia, and diabetes. 10-point ROS otherwise negative.  ____________________________________________   PHYSICAL EXAM:  VITAL SIGNS: ED Triage Vitals  Enc Vitals Group     BP 08/14/15 1121 120/91 mmHg     Pulse Rate 08/14/15 1121 86     Resp 08/14/15 1121 20     Temp 08/14/15 1121 98 F (36.7 C)     Temp Source 08/14/15 1121 Oral     SpO2 08/14/15 1121 95 %     Weight 08/14/15 1121 333 lb (151.048 kg)     Height 08/14/15 1121 _0  (1.676 m)     Head Cir --       Peak Flow --      Pain Score 08/14/15 1121 8     Pain Loc --      Pain Edu? --  Excl. in [place]? --     Constitutional: Alert and oriented. Well appearing and in no acute distress. Morbid obesity Eyes: Conjunctivae are normal. PERRL. EOMI. Head: Atraumatic. Nose: No congestion/rhinnorhea. Mouth/Throat: Mucous membranes are moist.  Oropharynx non-erythematous. Neck: No stridor.No cervical spine tenderness to palpation. Hematological/Lymphatic/Immunilogical: No cervical lymphadenopathy. Cardiovascular: Normal rate, regular rhythm. Grossly normal heart sounds.  Good peripheral circulation. Respiratory: Normal respiratory effort.  No retractions. Lungs CTAB. Gastrointestinal: Soft and nontender. No distention. No abdominal bruits. No CVA tenderness. Musculoskeletal: No obvious deformity to the right foot there is mild edema to the dorsal aspect of the foot. Hypertrophic nails. Neurologic:  Normal speech and language. No gross focal neurologic deficits are appreciated. No gait instability. Skin:  Skin is warm, dry and intact. No rash noted. Psychiatric: Mood and affect are normal. Speech and behavior are normal.  ____________________________________________   LABS (all labs ordered are listed, but only abnormal results are displayed)  Labs Reviewed - No data to display ____________________________________________  EKG   ____________________________________________  RADIOLOGY No acute findings on x-ray. I, Sable Feil, personally viewed and evaluated these images (plain radiographs) as part of my medical decision making.   ____________________________________________   PROCEDURES  Procedure(s) performed: None  Critical Care performed: No  ____________________________________________   INITIAL IMPRESSION / ASSESSMENT AND PLAN / ED COURSE  Pertinent labs & imaging results that were available during my care of the patient were reviewed by me and considered in my medical  decision making (see chart for details).  Right foot contusion. Chest x-ray findings with patient. Patient given discharge care instructions. Patient given a prescription for tramadol and ibuprofen. He advised follow-up family doctor if this if complain continue. ____________________________________________   FINAL CLINICAL IMPRESSION(S) / ED DIAGNOSES  Final diagnoses:  Foot contusion, right, initial encounter      Sable Feil, PA-C 08/14/15 1257  Harvest Dark, MD 08/14/15 [phone]

## 2015-08-21 ENCOUNTER — Ambulatory Visit: Payer: Self-pay | Attending: Family | Admitting: Family

## 2015-08-21 ENCOUNTER — Encounter: Payer: Self-pay | Admitting: Family

## 2015-08-21 VITALS — BP 125/80 | HR 80 | Resp 20 | Ht 67.0 in | Wt 344.0 lb

## 2015-08-21 DIAGNOSIS — G4733 Obstructive sleep apnea (adult) (pediatric): Secondary | ICD-10-CM

## 2015-08-21 DIAGNOSIS — I252 Old myocardial infarction: Secondary | ICD-10-CM | POA: Insufficient documentation

## 2015-08-21 DIAGNOSIS — Z7982 Long term (current) use of aspirin: Secondary | ICD-10-CM | POA: Insufficient documentation

## 2015-08-21 DIAGNOSIS — Z7984 Long term (current) use of oral hypoglycemic drugs: Secondary | ICD-10-CM | POA: Insufficient documentation

## 2015-08-21 DIAGNOSIS — I429 Cardiomyopathy, unspecified: Secondary | ICD-10-CM | POA: Insufficient documentation

## 2015-08-21 DIAGNOSIS — I5022 Chronic systolic (congestive) heart failure: Secondary | ICD-10-CM

## 2015-08-21 DIAGNOSIS — I509 Heart failure, unspecified: Secondary | ICD-10-CM | POA: Insufficient documentation

## 2015-08-21 DIAGNOSIS — E785 Hyperlipidemia, unspecified: Secondary | ICD-10-CM | POA: Insufficient documentation

## 2015-08-21 DIAGNOSIS — K219 Gastro-esophageal reflux disease without esophagitis: Secondary | ICD-10-CM | POA: Insufficient documentation

## 2015-08-21 DIAGNOSIS — R0902 Hypoxemia: Secondary | ICD-10-CM | POA: Insufficient documentation

## 2015-08-21 DIAGNOSIS — I2510 Atherosclerotic heart disease of native coronary artery without angina pectoris: Secondary | ICD-10-CM | POA: Insufficient documentation

## 2015-08-21 DIAGNOSIS — M940 Chondrocostal junction syndrome [Tietze]: Secondary | ICD-10-CM

## 2015-08-21 DIAGNOSIS — I10 Essential (primary) hypertension: Secondary | ICD-10-CM

## 2015-08-21 DIAGNOSIS — Z79899 Other long term (current) drug therapy: Secondary | ICD-10-CM | POA: Insufficient documentation

## 2015-08-21 DIAGNOSIS — Z9889 Other specified postprocedural states: Secondary | ICD-10-CM | POA: Insufficient documentation

## 2015-08-21 DIAGNOSIS — I110 Hypertensive heart disease with heart failure: Secondary | ICD-10-CM | POA: Insufficient documentation

## 2015-08-21 DIAGNOSIS — E119 Type 2 diabetes mellitus without complications: Secondary | ICD-10-CM | POA: Insufficient documentation

## 2015-08-21 DIAGNOSIS — F17210 Nicotine dependence, cigarettes, uncomplicated: Secondary | ICD-10-CM | POA: Insufficient documentation

## 2015-08-21 DIAGNOSIS — Z794 Long term (current) use of insulin: Secondary | ICD-10-CM

## 2015-08-21 DIAGNOSIS — Z720 Tobacco use: Secondary | ICD-10-CM

## 2015-08-21 MED ORDER — SACUBITRIL-VALSARTAN 24-26 MG PO TABS: 1.0000 | ORAL_TABLET | Freq: Two times a day (BID) | ORAL | Status: DC

## 2015-08-21 MED ORDER — INSULIN GLARGINE 100 UNIT/ML ~~LOC~~ SOLN: 10.0000 [IU] | Freq: Every day | SUBCUTANEOUS | Status: DC

## 2015-08-21 MED ORDER — SYRINGE (DISPOSABLE) 1 ML MISC: 0.1000 mL | Status: DC

## 2015-08-21 NOTE — Patient Instructions (Addendum)
Continue weighing daily and call for an overnight weight gain of > 2 pounds or a weekly weight gain of >5 pounds.  Finish out the Lisinopril. When you take the last Lisinopril tablet, you will not begin Entresto until the following evening and then you will only take 1 tablet that evening. The next morning, you will start taking it twice daily (morning & evening).

## 2015-08-21 NOTE — Progress Notes (Addendum)
Subjective:    Patient ID: Oscar [name], male    DOB: Jul 04, 1976, 39 y.o.   MRN: [id number]  Congestive Heart Failure Presents for follow-up visit. The disease course has been stable. Associated symptoms include chest pain (mid-sternal to palpation only), fatigue, palpitations and shortness of breath. Pertinent negatives include no abdominal pain or edema. The symptoms have been stable. The pain is at a severity of 4/10. The pain is moderate. The pain is present in the substernal region. The quality of the pain is described as dull. The pain does not radiate. Chest pain occurs with exertion and at rest. Past treatments include nothing. His past medical history is significant for CAD, DM and HTN. Compliance with total regimen is 76-100%.  Hypertension This is a chronic problem. The current episode started more than 1 year ago. The problem is unchanged. The problem is controlled. Associated symptoms include chest pain (mid-sternal to palpation only), malaise/fatigue, neck pain, palpitations and shortness of breath. Pertinent negatives include no headaches or peripheral edema. There are no associated agents to hypertension. Risk factors for coronary artery disease include diabetes mellitus, obesity, male gender, dyslipidemia and family history. Past treatments include ACE inhibitors, beta blockers, diuretics and lifestyle changes. The current treatment provides significant improvement. Compliance problems include medication cost.  Hypertensive end-organ damage includes CAD/MI and heart failure.    Past Medical History  Diagnosis Date  . Hypertension   . GERD (gastroesophageal reflux disease)   . Sleep apnea     osa  . Hypoxemia   . CHF (congestive heart failure) ([place])   . Failure in dosage     chronic respiratory   . Dysrhythmia     svt  . Coronary artery disease   . Myocardial infarction ([place])     5093,2671,2/45  . Cardiomyopathy ([place])   . Diabetes mellitus without complication ([place])    . Hyperlipidemia     Past Surgical History  Procedure Laterality Date  . Left heart catheterization with coronary angiogram N/A 12/09/2014    Procedure: LEFT HEART CATHETERIZATION WITH CORONARY ANGIOGRAM;  Surgeon: Burnell Blanks, MD;  Location: [facility] CATH LAB;  Service: Cardiovascular;  Laterality: N/A;  . Cardiac catheterization  12/11/2014    Procedure: RIGHT/LEFT HEART CATH AND CORONARY ANGIOGRAPHY;  Surgeon: Lorretta Harp, MD;  Location: [facility] CATH LAB;  Service: Cardiovascular;;  . Icd lead removal N/A 03/30/2015    Procedure: ICD LEAD REMOVAL;  Surgeon: Marzetta Board, MD;  Location: [facility] ORS;  Service: Cardiovascular;  Laterality: N/A;  . Implantable cardioverter defibrillator implant    . Insert / replace / remove pacemaker      Family History  Problem Relation Age of Onset  . Hypertension Mother   . Hypertension Sister   . Diabetes Sister   . Anemia Neg Hx   . Arrhythmia Neg Hx   . Asthma Neg Hx   . Clotting disorder Neg Hx   . Fainting Neg Hx   . Heart attack Neg Hx   . Heart disease Neg Hx   . Heart failure Neg Hx   . Hyperlipidemia Neg Hx     Social History  Substance Use Topics  . Smoking status: Current Every Day Smoker -- 0.50 packs/day for 24 years    Types: Cigarettes  . Smokeless tobacco: Never Used  . Alcohol Use: No    No Known Allergies  Prior to Admission medications   Medication Sig Start Date End Date Taking? Authorizing Provider  aspirin EC 325 MG  tablet Take 1 tablet (325 mg total) by mouth daily. 07/10/15  Yes Aldean Jewett, MD  atorvastatin (LIPITOR) 80 MG tablet Take 1 tablet (80 mg total) by mouth daily at 6 PM. 07/10/15  Yes Aldean Jewett, MD  carvedilol (COREG) 6.25 MG tablet Take 1 tablet (6.25 mg total) by mouth 2 (two) times daily with a meal. 07/10/15  Yes Aldean Jewett, MD  furosemide (LASIX) 20 MG tablet Take 1 tablet (20 mg total) by mouth daily. 07/10/15  Yes Aldean Jewett, MD  hydrALAZINE (APRESOLINE) 25 MG tablet  Take 1 tablet (25 mg total) by mouth 2 (two) times daily. 07/10/15  Yes Aldean Jewett, MD  insulin glargine (LANTUS) 100 UNIT/ML injection Inject 0.1 mLs (10 Units total) into the skin daily. 08/21/15  Yes Alisa Graff, FNP  insulin starter kit- syringes MISC 1 kit by Other route once. 07/10/15  Yes Aldean Jewett, MD  metFORMIN (GLUCOPHAGE) 1000 MG tablet Take 1 tablet (1,000 mg total) by mouth 2 (two) times daily with a meal. 07/10/15  Yes Aldean Jewett, MD  omeprazole (PRILOSEC) 20 MG capsule Take 1 capsule (20 mg total) by mouth daily. 07/10/15  Yes Aldean Jewett, MD  potassium chloride SA (K-DUR,KLOR-CON) 20 MEQ tablet Take 1 tablet (20 mEq total) by mouth daily. 07/10/15  Yes Aldean Jewett, MD  Syringe, Disposable, 1 ML MISC 0.1 mLs by Does not apply route as directed. 08/21/15  Yes Alisa Graff, FNP  sacubitril-valsartan (ENTRESTO) 24-26 MG Take 1 tablet by mouth 2 (two) times daily. 08/21/15   Alisa Graff, FNP     Review of Systems  Constitutional: Positive for malaise/fatigue and fatigue. Negative for appetite change.  HENT: Negative for congestion, postnasal drip and sore throat.   Eyes: Negative.   Respiratory: Positive for shortness of breath and wheezing. Negative for cough and chest tightness.   Cardiovascular: Positive for chest pain (mid-sternal to palpation only) and palpitations. Negative for leg swelling.  Gastrointestinal: Negative for abdominal pain and abdominal distention.  Endocrine: Negative.   Genitourinary: Negative.   Musculoskeletal: Positive for back pain, arthralgias (right foot) and neck pain.  Skin: Negative.   Allergic/Immunologic: Negative.   Neurological: Negative for dizziness, light-headedness and headaches.  Hematological: Negative for adenopathy. Does not bruise/bleed easily.  Psychiatric/Behavioral: Negative for sleep disturbance (sleeping on 4 pillows) and dysphoric mood. The patient is not nervous/anxious.        Objective:    Physical Exam  Constitutional: He is oriented to person, place, and time. He appears well-developed and well-nourished.  HENT:  Head: Normocephalic and atraumatic.  Eyes: Conjunctivae are normal. Pupils are equal, round, and reactive to light.  Neck: Normal range of motion. Neck supple.  Cardiovascular: Normal rate and regular rhythm.   Pulmonary/Chest: Effort normal. He has no wheezes. He has no rales.  Abdominal: Soft. He exhibits no distension. There is no tenderness.  Musculoskeletal: He exhibits no edema or tenderness.  Tender to palpation over the mid-sternum  Neurological: He is alert and oriented to person, place, and time.  Skin: Skin is warm and dry.  Psychiatric: He has a normal mood and affect. His behavior is normal. Thought content normal.  Nursing note and vitals reviewed.   BP 125/80 mmHg  Pulse 80  Resp 20  Ht 5' 7"  (1.702 m)  Wt 344 lb (156.037 kg)  BMI 53.87 kg/m2  SpO2 97%       Assessment & Plan:  1: Chronic  heart failure with reduced ejection fraction- Patient presents with fatigue and shortness of breath upon exertion which is stable. He continues to weigh himself and has noticed a gradual weight gain. By our scale, he has gained 6 pounds in the last month. He doesn't add salt to his food but says that he doesn't cook his food. His sister does the cooking and he thinks that she may cook with salt. He does wear oxygen on exertion. He will finish out taking his lisinopril and then wait 36 hours before beginning entresto 24/86m. He will begin by taking just 1 tablet the evening he begins it and then will start it BID the following day. Prescription sent electronically to [facility]. He has about 2 weeks left of his lisinopril to finish.  2: HTN- Blood pressure looks good today. 3: Obstructive sleep apnea- We are still trying to track down his sleep study results. 4: Diabetes- He didn't check his glucose this morning but yesterday when he  checked it, it was 121. Prescription sent to [facility] for his insulin and needles as the price for his insulin was too much at his regular pharmacy. 5: Tobacco use- Patient continues to smoke and cessation was discussed for 3 minutes with the patient. 6: Costochondritis- Patient is tender to palpation directly over the sternum. Can you icy hot/bengay etc and ice to help alleviate inflammation. [facility]. Can present to ER if symptom worsens or changes in anyway.   Return in 2 months or sooner for any questions/problems before then.

## 2015-08-23 ENCOUNTER — Ambulatory Visit: Payer: Self-pay | Admitting: Family

## 2015-09-01 ENCOUNTER — Emergency Department: Payer: Self-pay

## 2015-09-01 ENCOUNTER — Encounter: Payer: Self-pay | Admitting: Emergency Medicine

## 2015-09-01 ENCOUNTER — Emergency Department
Admission: EM | Admit: 2015-09-01 | Discharge: 2015-09-01 | Disposition: A | Discharge status: Home / Self Care | Payer: Self-pay | Attending: Emergency Medicine | Admitting: Emergency Medicine

## 2015-09-01 DIAGNOSIS — E119 Type 2 diabetes mellitus without complications: Secondary | ICD-10-CM | POA: Insufficient documentation

## 2015-09-01 DIAGNOSIS — E669 Obesity, unspecified: Secondary | ICD-10-CM | POA: Insufficient documentation

## 2015-09-01 DIAGNOSIS — Z7984 Long term (current) use of oral hypoglycemic drugs: Secondary | ICD-10-CM | POA: Insufficient documentation

## 2015-09-01 DIAGNOSIS — R109 Unspecified abdominal pain: Secondary | ICD-10-CM

## 2015-09-01 DIAGNOSIS — R1084 Generalized abdominal pain: Secondary | ICD-10-CM

## 2015-09-01 DIAGNOSIS — Z7982 Long term (current) use of aspirin: Secondary | ICD-10-CM | POA: Insufficient documentation

## 2015-09-01 DIAGNOSIS — Z79899 Other long term (current) drug therapy: Secondary | ICD-10-CM | POA: Insufficient documentation

## 2015-09-01 DIAGNOSIS — Z794 Long term (current) use of insulin: Secondary | ICD-10-CM | POA: Insufficient documentation

## 2015-09-01 DIAGNOSIS — I10 Essential (primary) hypertension: Secondary | ICD-10-CM | POA: Insufficient documentation

## 2015-09-01 DIAGNOSIS — N390 Urinary tract infection, site not specified: Secondary | ICD-10-CM | POA: Insufficient documentation

## 2015-09-01 DIAGNOSIS — F17210 Nicotine dependence, cigarettes, uncomplicated: Secondary | ICD-10-CM | POA: Insufficient documentation

## 2015-09-01 LAB — COMPREHENSIVE METABOLIC PANEL
ALK PHOS: 166 U/L — AB (ref 38–126)
ALT: 36 U/L (ref 17–63)
AST: 35 U/L (ref 15–41)
Albumin: 3.8 g/dL (ref 3.5–5.0)
Anion gap: 8 (ref 5–15)
BUN: 15 mg/dL (ref 6–20)
CALCIUM: 9.1 mg/dL (ref 8.9–10.3)
CHLORIDE: 98 mmol/L — AB (ref 101–111)
CO2: 31 mmol/L (ref 22–32)
CREATININE: 0.81 mg/dL (ref 0.61–1.24)
GFR calc non Af Amer: >60 mL/min (ref >60)
GLUCOSE: 155 mg/dL — AB (ref 65–99)
Potassium: 4.4 mmol/L (ref 3.5–5.1)
SODIUM: 137 mmol/L (ref 135–145)
Total Bilirubin: 0.6 mg/dL (ref 0.3–1.2)
Total Protein: 8.3 g/dL — ABNORMAL HIGH (ref 6.5–8.1)

## 2015-09-01 LAB — URINALYSIS COMPLETE WITH MICROSCOPIC (ARMC ONLY)
Bilirubin Urine: NEGATIVE
Glucose, UA: NEGATIVE mg/dL
Hgb urine dipstick: NEGATIVE
KETONES UR: NEGATIVE mg/dL
Nitrite: NEGATIVE
PH: 5 (ref 5.0–8.0)
Specific Gravity, Urine: 1.025 (ref 1.005–1.030)

## 2015-09-01 LAB — CBC WITH DIFFERENTIAL/PLATELET
BASOS ABS: 0 10*3/uL (ref 0–0.1)
Basophils Relative: 0 %
EOS ABS: 0.2 10*3/uL (ref 0–0.7)
Eosinophils Relative: 3 %
HCT: 47.3 % (ref 40.0–52.0)
HEMOGLOBIN: 16.4 g/dL (ref 13.0–18.0)
LYMPHS ABS: 2.4 10*3/uL (ref 1.0–3.6)
LYMPHS PCT: 48 %
MCH: 32.3 pg (ref 26.0–34.0)
MCHC: 34.7 g/dL (ref 32.0–36.0)
MCV: 93.1 fL (ref 80.0–100.0)
Monocytes Absolute: 0.3 10*3/uL (ref 0.2–1.0)
Monocytes Relative: 6 %
Neutro Abs: 2.2 10*3/uL (ref 1.4–6.5)
Neutrophils Relative %: 43 %
PLATELETS: 178 10*3/uL (ref 150–440)
RBC: 5.08 MIL/uL (ref 4.40–5.90)
RDW: 13 % (ref 11.5–14.5)
WBC: 5 10*3/uL (ref 3.8–10.6)

## 2015-09-01 LAB — LIPASE, BLOOD: Lipase: 52 U/L — ABNORMAL HIGH (ref 11–51)

## 2015-09-01 MED ORDER — CEPHALEXIN 500 MG PO CAPS: 500.0000 mg | ORAL_CAPSULE | Freq: Two times a day (BID) | ORAL | Status: DC

## 2015-09-01 MED ORDER — DEXTROSE 5 % IV SOLN
1.0000 g | Freq: Once | INTRAVENOUS | Status: AC
  Administered 2015-09-01: 1 g via INTRAVENOUS
  Filled 2015-09-01: qty 10

## 2015-09-01 MED ORDER — MORPHINE SULFATE (PF) 4 MG/ML IV SOLN
4.0000 mg | Freq: Once | INTRAVENOUS | Status: AC
  Administered 2015-09-01: 4 mg via INTRAVENOUS
  Filled 2015-09-01: qty 1

## 2015-09-01 MED ORDER — ONDANSETRON HCL 4 MG/2ML IJ SOLN
4.0000 mg | Freq: Once | INTRAMUSCULAR | Status: AC
  Administered 2015-09-01: 4 mg via INTRAVENOUS
  Filled 2015-09-01: qty 2

## 2015-09-01 MED ORDER — SODIUM CHLORIDE 0.9 % IV BOLUS (SEPSIS)
500.0000 mL | Freq: Once | INTRAVENOUS | Status: AC
  Administered 2015-09-01: 500 mL via INTRAVENOUS

## 2015-09-01 MED ORDER — HYDROCODONE-ACETAMINOPHEN 5-325 MG PO TABS: 1.0000 | ORAL_TABLET | Freq: Four times a day (QID) | ORAL | Status: DC | PRN

## 2015-09-01 MED ORDER — PROMETHAZINE HCL 12.5 MG PO TABS: 25.0000 mg | ORAL_TABLET | Freq: Four times a day (QID) | ORAL | Status: DC | PRN

## 2015-09-01 NOTE — ED Provider Notes (Signed)
[facility] Emergency Department Provider Note   ____________________________________________  Time seen: Approximately 3:15 I have reviewed the triage vital signs and the triage nursing note.  HISTORY  Chief Complaint Abdominal Pain   Historian Patient  HPI Oscar [name] is a 39 y.o. male with a history of hypertension, GERD, CHF, and MI, and pancreatitis, is here for 3 days of periumbilical and right sided abdominal pain. Has been waxing and waning, but somewhat worse today. Does seem to be made worse with eating. No fever. Some pain up into the epigastrium into the base of the chest. No shortness of breath or trouble breathing. No fevers. No vomiting or diarrhea. Dark/bloody urine noted. No flank pain. He does report having pancreatic tightness in the past which was thought to be a medication side effect, he does not drink alcohol. He is unsure whether or not he has any gallstones. No prior abdominal surgeries.    Past Medical History  Diagnosis Date  . Hypertension   . GERD (gastroesophageal reflux disease)   . Sleep apnea     osa  . Hypoxemia   . CHF (congestive heart failure) ([place])   . Failure in dosage     chronic respiratory   . Dysrhythmia     svt  . Coronary artery disease   . Myocardial infarction ([place])     [id number],6/59  . Cardiomyopathy ([place])   . Diabetes mellitus without complication ([place])   . Hyperlipidemia     Patient Active Problem List   Diagnosis Date Noted  . Acute on chronic heart failure ([place]) 08/21/2015  . Obstructive sleep apnea 07/31/2015  . Tobacco use 07/31/2015  . Diabetes ([place]) 07/10/2015  . Chronic systolic heart failure ([place]) 03/30/2015  . Cardiac pacemaker 03/30/2015  . Chronic respiratory failure with hypercapnia ([place]) 12/12/2014  . Elevated troponin   . Non-ischemic cardiomyopathy ([place])   . Hypoxemia   . SVT (supraventricular tachycardia) ([place]) 12/09/2014  . NSTEMI (non-ST elevated myocardial  infarction) ([place]) 12/09/2014  . Hypertension 12/09/2014    Past Surgical History  Procedure Laterality Date  . Left heart catheterization with coronary angiogram N/A 12/09/2014    Procedure: LEFT HEART CATHETERIZATION WITH CORONARY ANGIOGRAM;  Surgeon: Burnell Blanks, MD;  Location: [facility] CATH LAB;  Service: Cardiovascular;  Laterality: N/A;  . Cardiac catheterization  12/11/2014    Procedure: RIGHT/LEFT HEART CATH AND CORONARY ANGIOGRAPHY;  Surgeon: Lorretta Harp, MD;  Location: [facility] CATH LAB;  Service: Cardiovascular;;  . Icd lead removal N/A 03/30/2015    Procedure: ICD LEAD REMOVAL;  Surgeon: Marzetta Board, MD;  Location: [facility] ORS;  Service: Cardiovascular;  Laterality: N/A;  . Implantable cardioverter defibrillator implant    . Insert / replace / remove pacemaker      Current Outpatient Rx  Name  Route  Sig  Dispense  Refill  . aspirin EC 325 MG tablet   Oral   Take 1 tablet (325 mg total) by mouth daily.   30 tablet   0   . atorvastatin (LIPITOR) 80 MG tablet   Oral   Take 1 tablet (80 mg total) by mouth daily at 6 PM. Patient taking differently: Take 80 mg by mouth every morning.    90 tablet   2   . carvedilol (COREG) 6.25 MG tablet   Oral   Take 1 tablet (6.25 mg total) by mouth 2 (two) times daily with a meal.   90 tablet   2   . furosemide (LASIX)  20 MG tablet   Oral   Take 1 tablet (20 mg total) by mouth daily.   30 tablet   0   . hydrALAZINE (APRESOLINE) 25 MG tablet   Oral   Take 1 tablet (25 mg total) by mouth 2 (two) times daily.   60 tablet   0   . insulin glargine (LANTUS) 100 UNIT/ML injection   Subcutaneous   Inject 0.1 mLs (10 Units total) into the skin daily. Patient taking differently: Inject 10 Units into the skin at bedtime.    10 mL   11   . insulin starter kit- syringes MISC   Other   1 kit by Other route once.   1 kit   0   . metFORMIN (GLUCOPHAGE) 1000 MG tablet   Oral   Take 1 tablet (1,000 mg total) by mouth 2 (two)  times daily with a meal.   60 tablet   0   . omeprazole (PRILOSEC) 20 MG capsule   Oral   Take 1 capsule (20 mg total) by mouth daily.   30 capsule   0   . potassium chloride SA (K-DUR,KLOR-CON) 20 MEQ tablet   Oral   Take 1 tablet (20 mEq total) by mouth daily.   30 tablet   0   . sacubitril-valsartan (ENTRESTO) 24-26 MG   Oral   Take 1 tablet by mouth 2 (two) times daily.   180 tablet   3   . Syringe, Disposable, 1 ML MISC   Does not apply   0.1 mLs by Does not apply route as directed.   60 each   11     Allergies Review of patient's allergies indicates no known allergies.  Family History  Problem Relation Age of Onset  . Hypertension Mother   . Hypertension Sister   . Diabetes Sister   . Anemia Neg Hx   . Arrhythmia Neg Hx   . Asthma Neg Hx   . Clotting disorder Neg Hx   . Fainting Neg Hx   . Heart attack Neg Hx   . Heart disease Neg Hx   . Heart failure Neg Hx   . Hyperlipidemia Neg Hx     Social History Social History  Substance Use Topics  . Smoking status: Current Every Day Smoker -- 0.50 packs/day for 24 years    Types: Cigarettes  . Smokeless tobacco: Never Used  . Alcohol Use: No    Review of Systems  Constitutional: Negative for fever. Eyes: Negative for visual changes. ENT: Negative for sore throat. Cardiovascular: Negative for palpitations Respiratory: Negative for shortness of breath. Negative for cough Gastrointestinal: Negative for vomiting and diarrhea. Genitourinary: Negative for dysuria. Musculoskeletal: Negative for back pain. Skin: Negative for rash. Neurological: Negative for headache. 10 point Review of Systems otherwise negative ____________________________________________   PHYSICAL EXAM:  VITAL SIGNS: ED Triage Vitals  Enc Vitals Group     BP 09/01/15 1243 132/76 mmHg     Pulse Rate 09/01/15 1243 78     Resp 09/01/15 1243 22     Temp 09/01/15 1243 98.6 F (37 C)     Temp src --      SpO2 09/01/15 1243 96 %      Weight 09/01/15 1243 344 lb (156.037 kg)     Height 09/01/15 1243 5' 6"  (1.676 m)     Head Cir --      Peak Flow --      Pain Score 09/01/15 1244 10  Pain Loc --      Pain Edu? --      Excl. in [place]? --      Constitutional: Alert and oriented. Well appearing and in no distress. Eyes: Conjunctivae are normal. PERRL. Normal extraocular movements. ENT   Head: Normocephalic and atraumatic.   Nose: No congestion/rhinnorhea.   Mouth/Throat: Mucous membranes are moist.   Neck: No stridor. Cardiovascular/Chest: Normal rate, regular rhythm.  No murmurs, rubs, or gallops. Respiratory: Normal respiratory effort without tachypnea nor retractions. Breath sounds are clear and equal bilaterally. No wheezes/rales/rhonchi. Gastrointestinal: Soft. No distention, no guarding, no rebound. Obese, moderate periumbilical and right-sided abdominal pain to palpation.  Genitourinary/rectal:Deferred Musculoskeletal: Nontender with normal range of motion in all extremities. No joint effusions.  No lower extremity tenderness.  No edema. Neurologic:  Normal speech and language. No gross or focal neurologic deficits are appreciated. Skin:  Skin is warm, dry and intact. No rash noted. Psychiatric: Mood and affect are normal. Speech and behavior are normal. Patient exhibits appropriate insight and judgment.  ____________________________________________   EKG I, Lisa Roca, MD, the attending physician have personally viewed and interpreted all ECGs.  81 bpm. Normal sinus rhythm. Left ventricular hypertrophy and left axis deviation. Nonspecific T-wave ____________________________________________  LABS (pertinent positives/negatives)  CBC within normal limits Comprehensive metabolic panel significant for alkaline phosphatase 166 and otherwise LFTs within normal limits Lipase 52 Urinalysis:  Trace leukocytes, 6-30 white blood cells, rare bacteria and 0-5 squamous epithelial  cells  ____________________________________________  RADIOLOGY All Xrays were viewed by me. Imaging interpreted by Radiologist.  Portable ultrasound: Unremarkable right upper quadrant abdominal ultrasound  X-ray abdomen with a chest: Negative abdominal regards. No acute cardio pulmonary disease __________________________________________  PROCEDURES  Procedure(s) performed: None  Critical Care performed: None  ____________________________________________   ED COURSE / ASSESSMENT AND PLAN  CONSULTATIONS: None  Pertinent labs & imaging results that were available during my care of the patient were reviewed by me and considered in my medical decision making (see chart for details).   The patient has had several days of waxing and waning mid abdominal pain, and on exam he does seem tender, possibly more right-sided but patient points to his mid abdomen. His symptoms sound exacerbated by food raising GERD versus biliary colic as likely sources of the pain. He's also had GERD in the past. He is denying lower abdominal tenderness.  Symptoms were controlled initially with medication. Patient reports he's had approximately 7 abdominal CT scans in the past, and we had a long discussion about the risk versus benefit. He is not a fever and his white blood cell count is normal. X-ray is normal. His pain is resolved, and so injured decision making we chose not to proceed with CT of abdomen today.  His urinalysis does show bacteria with leukocytes/white blood cells consistent with a urinary tract infection. Urine culture was sent. He was given a dose of Rocephin in the emergency department, and will be sent home on by mouth antibiotics. I think since there is bacteria this is more likely a urinary infection rather than just wbc in urine (which might be suspicious for appendicitis).  On clinical exam he does not have any tenderness over McBurney's point or the lower abdomen, and I have a very low  suspicion for appendicitis.    Patient / Family / Caregiver informed of clinical course, medical decision-making process, and agree with plan.   I discussed return precautions, follow-up instructions, and discharged instructions with patient and/or family.  ___________________________________________  FINAL CLINICAL IMPRESSION(S) / ED DIAGNOSES   Final diagnoses:  Right sided abdominal pain  Generalized abdominal pain  Urinary tract infection without hematuria, site unspecified       Lisa Roca, MD 09/01/15 1918

## 2015-09-01 NOTE — ED Notes (Signed)
MD Lord  At bedside

## 2015-09-01 NOTE — Discharge Instructions (Signed)
You were evaluated for abdominal pain, and you're being treated for urinary tract infection.  Your lipase is slightly elevated indicating mild pancreatitis. Take a liquid diet for one to 2 days and then increase increase diet as tolerated.   We discussed whether or not to obtain a CT scan today, however given your multiple CT scans in the past, and your abdominal pain is resolved, we chose to hold off at this point in time.  Return to the emergency department immediately for any worsening condition including worsening pain, fever, black or bloody stools, concerned about dehydration, dizziness or passing out, or any other symptoms concerning to you.   Abdominal Pain, Adult Many things can cause belly (abdominal) pain. Most times, the belly pain is not dangerous. Many cases of belly pain can be watched and treated at home. HOME CARE   Do not take medicines that help you go poop (laxatives) unless told to by your doctor.  Only take medicine as told by your doctor.  Eat or drink as told by your doctor. Your doctor will tell you if you should be on a special diet. GET HELP IF:  You do not know what is causing your belly pain.  You have belly pain while you are sick to your stomach (nauseous) or have runny poop (diarrhea).  You have pain while you pee or poop.  Your belly pain wakes you up at night.  You have belly pain that gets worse or better when you eat.  You have belly pain that gets worse when you eat fatty foods.  You have a fever. GET HELP RIGHT AWAY IF:   The pain does not go away within 2 hours.  You keep throwing up (vomiting).  The pain changes and is only in the right or left part of the belly.  You have bloody or tarry looking poop. MAKE SURE YOU:   Understand these instructions.  Will watch your condition.  Will get help right away if you are not doing well or get worse.   This information is not intended to replace advice given to you by your health care  provider. Make sure you discuss any questions you have with your health care provider.   Document Released: 02/04/2008 Document Revised: 09/08/2014 Document Reviewed: 04/27/2013 Elsevier Interactive Patient Education 2016 Else[employer]  Urinary Tract Infection Urinary tract infections (UTIs) can develop anywhere along your urinary tract. Your urinary tract is your body's drainage system for removing wastes and extra water. Your urinary tract includes two kidneys, two ureters, a bladder, and a urethra. Your kidneys are a pair of bean-shaped organs. Each kidney is about the size of your fist. They are located below your ribs, one on each side of your spine. CAUSES Infections are caused by microbes, which are microscopic organisms, including fungi, viruses, and bacteria. These organisms are so small that they can only be seen through a microscope. Bacteria are the microbes that most commonly cause UTIs. SYMPTOMS  Symptoms of UTIs may vary by age and gender of the patient and by the location of the infection. Symptoms in young women typically include a frequent and intense urge to urinate and a painful, burning feeling in the bladder or urethra during urination. Older women and men are more likely to be tired, shaky, and weak and have muscle aches and abdominal pain. A fever may mean the infection is in your kidneys. Other symptoms of a kidney infection include pain in your back or sides below the ribs,  nausea, and vomiting. DIAGNOSIS To diagnose a UTI, your caregiver will ask you about your symptoms. Your caregiver will also ask you to provide a urine sample. The urine sample will be tested for bacteria and white blood cells. White blood cells are made by your body to help fight infection. TREATMENT  Typically, UTIs can be treated with medication. Because most UTIs are caused by a bacterial infection, they usually can be treated with the use of antibiotics. The choice of antibiotic and length of  treatment depend on your symptoms and the type of bacteria causing your infection. HOME CARE INSTRUCTIONS  If you were prescribed antibiotics, take them exactly as your caregiver instructs you. Finish the medication even if you feel better after you have only taken some of the medication.  Drink enough water and fluids to keep your urine clear or pale yellow.  Avoid caffeine, tea, and carbonated beverages. They tend to irritate your bladder.  Empty your bladder often. Avoid holding urine for long periods of time.  Empty your bladder before and after sexual intercourse.  After a bowel movement, women should cleanse from front to back. Use each tissue only once. SEEK MEDICAL CARE IF:   You have back pain.  You develop a fever.  Your symptoms do not begin to resolve within 3 days. SEEK IMMEDIATE MEDICAL CARE IF:   You have severe back pain or lower abdominal pain.  You develop chills.  You have nausea or vomiting.  You have continued burning or discomfort with urination. MAKE SURE YOU:   Understand these instructions.  Will watch your condition.  Will get help right away if you are not doing well or get worse.   This information is not intended to replace advice given to you by your health care provider. Make sure you discuss any questions you have with your health care provider.   Document Released: 05/28/2005 Document Revised: 05/09/2015 Document Reviewed: 09/26/2011 Elsevier Interactive Patient Education [employer].

## 2015-09-01 NOTE — ED Notes (Signed)
Reports upper abd pain x 2 days.  Denies n/v.  States he has noticed blood in his urine x 2 days.

## 2015-09-01 NOTE — ED Notes (Signed)
Pt instructed to give urine sample as soon as possible.

## 2015-09-03 LAB — URINE CULTURE

## 2015-09-11 ENCOUNTER — Ambulatory Visit: Payer: Self-pay

## 2015-09-13 ENCOUNTER — Ambulatory Visit: Payer: Self-pay

## 2015-09-17 ENCOUNTER — Ambulatory Visit: Payer: Self-pay

## 2015-10-01 ENCOUNTER — Emergency Department
Admission: EM | Admit: 2015-10-01 | Discharge: 2015-10-01 | Disposition: A | Discharge status: Home / Self Care | Payer: Self-pay | Attending: Emergency Medicine | Admitting: Emergency Medicine

## 2015-10-01 ENCOUNTER — Encounter: Payer: Self-pay | Admitting: Emergency Medicine

## 2015-10-01 ENCOUNTER — Emergency Department: Payer: Self-pay

## 2015-10-01 DIAGNOSIS — Z794 Long term (current) use of insulin: Secondary | ICD-10-CM | POA: Insufficient documentation

## 2015-10-01 DIAGNOSIS — R109 Unspecified abdominal pain: Secondary | ICD-10-CM

## 2015-10-01 DIAGNOSIS — Z792 Long term (current) use of antibiotics: Secondary | ICD-10-CM | POA: Insufficient documentation

## 2015-10-01 DIAGNOSIS — R1011 Right upper quadrant pain: Secondary | ICD-10-CM | POA: Insufficient documentation

## 2015-10-01 DIAGNOSIS — I10 Essential (primary) hypertension: Secondary | ICD-10-CM | POA: Insufficient documentation

## 2015-10-01 DIAGNOSIS — F17210 Nicotine dependence, cigarettes, uncomplicated: Secondary | ICD-10-CM | POA: Insufficient documentation

## 2015-10-01 DIAGNOSIS — Z79899 Other long term (current) drug therapy: Secondary | ICD-10-CM | POA: Insufficient documentation

## 2015-10-01 DIAGNOSIS — Z7984 Long term (current) use of oral hypoglycemic drugs: Secondary | ICD-10-CM | POA: Insufficient documentation

## 2015-10-01 DIAGNOSIS — R0602 Shortness of breath: Secondary | ICD-10-CM | POA: Insufficient documentation

## 2015-10-01 DIAGNOSIS — E119 Type 2 diabetes mellitus without complications: Secondary | ICD-10-CM | POA: Insufficient documentation

## 2015-10-01 DIAGNOSIS — Z7982 Long term (current) use of aspirin: Secondary | ICD-10-CM | POA: Insufficient documentation

## 2015-10-01 LAB — BASIC METABOLIC PANEL
Anion gap: 5 (ref 5–15)
BUN: 16 mg/dL (ref 6–20)
CHLORIDE: 99 mmol/L — AB (ref 101–111)
CO2: 33 mmol/L — AB (ref 22–32)
CREATININE: 0.86 mg/dL (ref 0.61–1.24)
Calcium: 9.2 mg/dL (ref 8.9–10.3)
GFR calc Af Amer: >60 mL/min (ref >60)
GFR calc non Af Amer: >60 mL/min (ref >60)
Glucose, Bld: 181 mg/dL — ABNORMAL HIGH (ref 65–99)
Potassium: 4.2 mmol/L (ref 3.5–5.1)
SODIUM: 137 mmol/L (ref 135–145)

## 2015-10-01 LAB — CBC
HEMATOCRIT: 48 % (ref 40.0–52.0)
HEMOGLOBIN: 16.6 g/dL (ref 13.0–18.0)
MCH: 32 pg (ref 26.0–34.0)
MCHC: 34.6 g/dL (ref 32.0–36.0)
MCV: 92.4 fL (ref 80.0–100.0)
PLATELETS: 168 10*3/uL (ref 150–440)
RBC: 5.19 MIL/uL (ref 4.40–5.90)
RDW: 13.8 % (ref 11.5–14.5)
WBC: 5 10*3/uL (ref 3.8–10.6)

## 2015-10-01 LAB — URINALYSIS COMPLETE WITH MICROSCOPIC (ARMC ONLY)
BACTERIA UA: NONE SEEN
Bilirubin Urine: NEGATIVE
GLUCOSE, UA: NEGATIVE mg/dL
HGB URINE DIPSTICK: NEGATIVE
Ketones, ur: NEGATIVE mg/dL
Nitrite: NEGATIVE
Protein, ur: >500 mg/dL — AB
SPECIFIC GRAVITY, URINE: 1.018 (ref 1.005–1.030)
pH: 6 (ref 5.0–8.0)

## 2015-10-01 MED ORDER — ACETAMINOPHEN 500 MG PO TABS
1000.0000 mg | ORAL_TABLET | Freq: Once | ORAL | Status: AC
  Administered 2015-10-01: 1000 mg via ORAL

## 2015-10-01 NOTE — ED Notes (Signed)
Pt was seen here for uti 2 weeks ago, was given antibiotics, finished them, here now for continued left flank pain and hematuria.

## 2015-10-01 NOTE — Discharge Instructions (Signed)

## 2015-10-01 NOTE — ED Notes (Signed)
MD Kinner at bedside  

## 2015-10-01 NOTE — ED Provider Notes (Signed)
[facility] Emergency Department Provider Note  ____________________________________________   I have reviewed the triage vital signs and the nursing notes.   HISTORY  Chief Complaint Flank Pain    HPI Oscar [name] is a 40 y.o. male who presents with right-sided intermittent pain. More specifically appears to be in the right upper quadrant where he has discomfort especially after eating. He was treated for urinary tract infection 2 weeks ago and he feels this pain is similar. He reports improved after receiving antibiotics. He denies fevers or chills. No nausea or vomiting. He does have a history of CHF GERD and pancreatitis.      Past Medical History  Diagnosis Date  . Hypertension   . GERD (gastroesophageal reflux disease)   . Sleep apnea     osa  . Hypoxemia   . CHF (congestive heart failure) ([place])   . Failure in dosage     chronic respiratory   . Dysrhythmia     svt  . Coronary artery disease   . Myocardial infarction ([place])     4193,7902,4/09  . Cardiomyopathy ([place])   . Diabetes mellitus without complication ([place])   . Hyperlipidemia     Patient Active Problem List   Diagnosis Date Noted  . Acute on chronic heart failure ([place]) 08/21/2015  . Obstructive sleep apnea 07/31/2015  . Tobacco use 07/31/2015  . Diabetes ([place]) 07/10/2015  . Chronic systolic heart failure ([place]) 03/30/2015  . Cardiac pacemaker 03/30/2015  . Chronic respiratory failure with hypercapnia ([place]) 12/12/2014  . Elevated troponin   . Non-ischemic cardiomyopathy ([place])   . Hypoxemia   . SVT (supraventricular tachycardia) ([place]) 12/09/2014  . NSTEMI (non-ST elevated myocardial infarction) ([place]) 12/09/2014  . Hypertension 12/09/2014    Past Surgical History  Procedure Laterality Date  . Left heart catheterization with coronary angiogram N/A 12/09/2014    Procedure: LEFT HEART CATHETERIZATION WITH CORONARY ANGIOGRAM;  Surgeon: Burnell Blanks, MD;  Location: [facility]  CATH LAB;  Service: Cardiovascular;  Laterality: N/A;  . Cardiac catheterization  12/11/2014    Procedure: RIGHT/LEFT HEART CATH AND CORONARY ANGIOGRAPHY;  Surgeon: Lorretta Harp, MD;  Location: [facility] CATH LAB;  Service: Cardiovascular;;  . Icd lead removal N/A 03/30/2015    Procedure: ICD LEAD REMOVAL;  Surgeon: Marzetta Board, MD;  Location: [facility] ORS;  Service: Cardiovascular;  Laterality: N/A;  . Implantable cardioverter defibrillator implant    . Insert / replace / remove pacemaker      Current Outpatient Rx  Name  Route  Sig  Dispense  Refill  . aspirin EC 325 MG tablet   Oral   Take 1 tablet (325 mg total) by mouth daily.   30 tablet   0   . atorvastatin (LIPITOR) 80 MG tablet   Oral   Take 1 tablet (80 mg total) by mouth daily at 6 PM. Patient taking differently: Take 80 mg by mouth every morning.    90 tablet   2   . carvedilol (COREG) 6.25 MG tablet   Oral   Take 1 tablet (6.25 mg total) by mouth 2 (two) times daily with a meal.   90 tablet   2   . cephALEXin (KEFLEX) 500 MG capsule   Oral   Take 1 capsule (500 mg total) by mouth 2 (two) times daily.   14 capsule   0   . furosemide (LASIX) 20 MG tablet   Oral   Take 1 tablet (20 mg total) by mouth daily.  30 tablet   0   . hydrALAZINE (APRESOLINE) 25 MG tablet   Oral   Take 1 tablet (25 mg total) by mouth 2 (two) times daily.   60 tablet   0   . HYDROcodone-acetaminophen (NORCO/VICODIN) 5-325 MG tablet   Oral   Take 1 tablet by mouth every 6 (six) hours as needed for moderate pain.   6 tablet   0   . insulin glargine (LANTUS) 100 UNIT/ML injection   Subcutaneous   Inject 0.1 mLs (10 Units total) into the skin daily. Patient taking differently: Inject 10 Units into the skin at bedtime.    10 mL   11   . insulin starter kit- syringes MISC   Other   1 kit by Other route once.   1 kit   0   . metFORMIN (GLUCOPHAGE) 1000 MG tablet   Oral   Take 1 tablet (1,000 mg total) by mouth 2 (two) times  daily with a meal.   60 tablet   0   . omeprazole (PRILOSEC) 20 MG capsule   Oral   Take 1 capsule (20 mg total) by mouth daily.   30 capsule   0   . potassium chloride SA (K-DUR,KLOR-CON) 20 MEQ tablet   Oral   Take 1 tablet (20 mEq total) by mouth daily.   30 tablet   0   . promethazine (PHENERGAN) 12.5 MG tablet   Oral   Take 2 tablets (25 mg total) by mouth every 6 (six) hours as needed for nausea or vomiting.   20 tablet   0   . sacubitril-valsartan (ENTRESTO) 24-26 MG   Oral   Take 1 tablet by mouth 2 (two) times daily.   180 tablet   3   . Syringe, Disposable, 1 ML MISC   Does not apply   0.1 mLs by Does not apply route as directed.   60 each   11     Allergies Review of patient's allergies indicates no known allergies.  Family History  Problem Relation Age of Onset  . Hypertension Mother   . Hypertension Sister   . Diabetes Sister   . Anemia Neg Hx   . Arrhythmia Neg Hx   . Asthma Neg Hx   . Clotting disorder Neg Hx   . Fainting Neg Hx   . Heart attack Neg Hx   . Heart disease Neg Hx   . Heart failure Neg Hx   . Hyperlipidemia Neg Hx     Social History Social History  Substance Use Topics  . Smoking status: Current Every Day Smoker -- 0.50 packs/day for 24 years    Types: Cigarettes  . Smokeless tobacco: Never Used  . Alcohol Use: No    Review of Systems  Constitutional: Negative for fever. Eyes: Negative for visual changes. ENT: Negative for sore throat Cardiovascular: Negative for chest pain. Respiratory: Chronic shortness of breath, unchanged Gastrointestinal: Negative for  vomiting and diarrhea. Genitourinary: Negative for dysuria. Musculoskeletal: Negative for back pain. Skin: Negative for rash. Neurological: Negative for headaches Psychiatric: No anxiety    ____________________________________________   PHYSICAL EXAM:  VITAL SIGNS: ED Triage Vitals  Enc Vitals Group     BP 10/01/15 0946 171/104 mmHg     Pulse Rate  10/01/15 0946 92     Resp 10/01/15 0946 18     Temp 10/01/15 0946 97.5 F (36.4 C)     Temp Source 10/01/15 0946 Oral     SpO2 10/01/15 0946  94 %     Weight 10/01/15 0946 324 lb (146.965 kg)     Height 10/01/15 0946 5' 3"  (1.6 m)     Head Cir --      Peak Flow --      Pain Score 10/01/15 0948 4     Pain Loc --      Pain Edu? --      Excl. in [place]? --      Constitutional: Alert and oriented. Well appearing and in no distress. Eyes: Conjunctivae are normal.  ENT   Head: Normocephalic and atraumatic.   Mouth/Throat: Mucous membranes are moist. Cardiovascular: Normal rate, regular rhythm. Normal and symmetric distal pulses are present in all extremities. Respiratory: Normal respiratory effort without tachypnea nor retractions.  Gastrointestinal: Soft and non-tender in all quadrants. No distention. There is no CVA tenderness. Genitourinary: deferred Musculoskeletal: Nontender with normal range of motion in all extremities. No lower extremity tenderness nor edema. Neurologic:  Normal speech and language. No gross focal neurologic deficits are appreciated. Skin:  Skin is warm, dry and intact. No rash noted. Psychiatric: Mood and affect are normal. Patient exhibits appropriate insight and judgment.  ____________________________________________    LABS (pertinent positives/negatives)  Labs Reviewed  URINALYSIS COMPLETEWITH MICROSCOPIC ([place] ONLY) - Abnormal; Notable for the following:    Color, Urine YELLOW (*)    APPearance CLEAR (*)    Protein, ur >500 (*)    Leukocytes, UA TRACE (*)    Squamous Epithelial / LPF 0-5 (*)    All other components within normal limits  BASIC METABOLIC PANEL - Abnormal; Notable for the following:    Chloride 99 (*)    CO2 33 (*)    Glucose, Bld 181 (*)    All other components within normal limits  CBC    ____________________________________________   EKG  None  ____________________________________________    RADIOLOGY I have  personally reviewed any xrays that were ordered on this patient: Normal right upper quadrant ultrasound  ____________________________________________   PROCEDURES  Procedure(s) performed: none  Critical Care performed: none  ____________________________________________   INITIAL IMPRESSION / ASSESSMENT AND PLAN / ED COURSE  Pertinent labs & imaging results that were available during my care of the patient were reviewed by me and considered in my medical decision making (see chart for details).  Patient is overall well-appearing with a benign abdominal exam. He seems to have right upper quadrant pain after eating which is suspicious for cholelithiasis. His lab work is reassuring and his urine is unremarkable. We will send him for an ultrasound  Ultrasound is unremarkable. The patient is pain-free without intervention. His labs are unremarkable as noted. I have asked him to follow-up with his PCP and we discussed return precautions  ____________________________________________   FINAL CLINICAL IMPRESSION(S) / ED DIAGNOSES  Final diagnoses:  Abdominal pain     Lavonia Drafts, MD 10/01/15 [phone]

## 2015-10-02 ENCOUNTER — Encounter: Payer: Self-pay | Admitting: Urgent Care

## 2015-10-02 DIAGNOSIS — I10 Essential (primary) hypertension: Secondary | ICD-10-CM | POA: Insufficient documentation

## 2015-10-02 DIAGNOSIS — K859 Acute pancreatitis without necrosis or infection, unspecified: Secondary | ICD-10-CM | POA: Insufficient documentation

## 2015-10-02 DIAGNOSIS — Z792 Long term (current) use of antibiotics: Secondary | ICD-10-CM | POA: Insufficient documentation

## 2015-10-02 DIAGNOSIS — Z794 Long term (current) use of insulin: Secondary | ICD-10-CM | POA: Insufficient documentation

## 2015-10-02 DIAGNOSIS — Z7982 Long term (current) use of aspirin: Secondary | ICD-10-CM | POA: Insufficient documentation

## 2015-10-02 DIAGNOSIS — E119 Type 2 diabetes mellitus without complications: Secondary | ICD-10-CM | POA: Insufficient documentation

## 2015-10-02 DIAGNOSIS — Z7984 Long term (current) use of oral hypoglycemic drugs: Secondary | ICD-10-CM | POA: Insufficient documentation

## 2015-10-02 DIAGNOSIS — F17210 Nicotine dependence, cigarettes, uncomplicated: Secondary | ICD-10-CM | POA: Insufficient documentation

## 2015-10-02 DIAGNOSIS — Z79899 Other long term (current) drug therapy: Secondary | ICD-10-CM | POA: Insufficient documentation

## 2015-10-02 LAB — COMPREHENSIVE METABOLIC PANEL
ALK PHOS: 191 U/L — AB (ref 38–126)
ALT: 45 U/L (ref 17–63)
AST: 52 U/L — ABNORMAL HIGH (ref 15–41)
Albumin: 3.6 g/dL (ref 3.5–5.0)
Anion gap: 9 (ref 5–15)
BILIRUBIN TOTAL: 0.7 mg/dL (ref 0.3–1.2)
BUN: 17 mg/dL (ref 6–20)
CALCIUM: 8.8 mg/dL — AB (ref 8.9–10.3)
CO2: 28 mmol/L (ref 22–32)
Chloride: 100 mmol/L — ABNORMAL LOW (ref 101–111)
Creatinine, Ser: 0.89 mg/dL (ref 0.61–1.24)
Glucose, Bld: 187 mg/dL — ABNORMAL HIGH (ref 65–99)
Potassium: 4.6 mmol/L (ref 3.5–5.1)
Sodium: 137 mmol/L (ref 135–145)
TOTAL PROTEIN: 8.1 g/dL (ref 6.5–8.1)

## 2015-10-02 LAB — CBC
HEMATOCRIT: 48.6 % (ref 40.0–52.0)
Hemoglobin: 16.7 g/dL (ref 13.0–18.0)
MCH: 32.4 pg (ref 26.0–34.0)
MCHC: 34.3 g/dL (ref 32.0–36.0)
MCV: 94.4 fL (ref 80.0–100.0)
Platelets: 180 10*3/uL (ref 150–440)
RBC: 5.15 MIL/uL (ref 4.40–5.90)
RDW: 13.7 % (ref 11.5–14.5)
WBC: 6 10*3/uL (ref 3.8–10.6)

## 2015-10-02 LAB — LIPASE, BLOOD: Lipase: 83 U/L — ABNORMAL HIGH (ref 11–51)

## 2015-10-02 NOTE — ED Notes (Addendum)
Spoke with Clearnce Hasten, MD regarding presenting c/o and the fact that patient was seen yesterday. MD with order for abdominal pain protocols with EKG and troponin only at this time.

## 2015-10-02 NOTE — ED Notes (Addendum)
Patient c/o 10/10 abdominal pain for one month. Patient has been seen here before for same complaint. C/o diarrhea, but no N/V. B/P 200/100, 18 Left AC, BS 273

## 2015-10-02 NOTE — ED Notes (Signed)
Patient presents to the ED via EMS with c/o periumbical pain x 2 weeks. Patient reports that he came yesterday and was "just sent home with aspirin". Patient returns today reporting that it feels like he is being stabbed with a knife. (+) nausea.

## 2015-10-03 ENCOUNTER — Emergency Department
Admission: EM | Admit: 2015-10-03 | Discharge: 2015-10-03 | Disposition: A | Discharge status: Home / Self Care | Payer: Self-pay | Attending: Emergency Medicine | Admitting: Emergency Medicine

## 2015-10-03 DIAGNOSIS — K859 Acute pancreatitis, unspecified: Secondary | ICD-10-CM

## 2015-10-03 MED ORDER — MORPHINE SULFATE (PF) 2 MG/ML IV SOLN
INTRAVENOUS | Status: AC
  Filled 2015-10-03: qty 2

## 2015-10-03 MED ORDER — ONDANSETRON 4 MG PO TBDP: 4.0000 mg | ORAL_TABLET | Freq: Three times a day (TID) | ORAL | Status: DC | PRN

## 2015-10-03 MED ORDER — OXYCODONE-ACETAMINOPHEN 5-325 MG PO TABS: 1.0000 | ORAL_TABLET | ORAL | Status: DC | PRN

## 2015-10-03 MED ORDER — ONDANSETRON HCL 4 MG/2ML IJ SOLN
INTRAMUSCULAR | Status: AC
  Administered 2015-10-03: 4 mg via INTRAVENOUS
  Filled 2015-10-03: qty 2

## 2015-10-03 MED ORDER — ONDANSETRON HCL 4 MG/2ML IJ SOLN
4.0000 mg | Freq: Once | INTRAMUSCULAR | Status: AC
  Administered 2015-10-03: 4 mg via INTRAVENOUS

## 2015-10-03 MED ORDER — MORPHINE SULFATE (PF) 4 MG/ML IV SOLN
4.0000 mg | Freq: Once | INTRAVENOUS | Status: AC
  Administered 2015-10-03: 4 mg via INTRAVENOUS

## 2015-10-03 NOTE — ED Notes (Signed)
Patient discharge and follow up information reviewed with patient by ED nursing staff and patient given the opportunity to ask questions pertaining to ED visit and discharge plan of care. Patient advised that should symptoms not continue to improve, resolve entirely, or should new symptoms develop then a follow up visit with their PCP or a return visit to the ED may be warranted. Patient verbalized consent and understanding of discharge plan of care including potential need for further evaluation. Patient being discharged in stable condition per attending ED physician on duty.  Patient ambulatory to w/c with steady gait - taken to lobby by this RN to await taxi; 34min ETA provided by cab company.

## 2015-10-03 NOTE — Discharge Instructions (Signed)

## 2015-10-03 NOTE — ED Provider Notes (Signed)
[facility] Emergency Department Provider Note  ____________________________________________  Time seen: 11:45 PM  I have reviewed the triage vital signs and the nursing notes.   HISTORY  Chief Complaint Abdominal Pain      HPI Oscar [name] is a 40 y.o. male presents with epigastric abdominal pain for approximately 2 weeks. Patient admits to nausea however no vomiting. Patient admits to diarrhea. Patient states that his current pain score the time presentation was 10 out of 10 currently 5 out of 10 status post IV morphine. Patient admits to previous episodes of pancreatitis which was consistent with the current pain. Patient states that the pain is worse following eating denies any alleviating factors.    Past Medical History  Diagnosis Date  . Hypertension   . GERD (gastroesophageal reflux disease)   . Sleep apnea     osa  . Hypoxemia   . CHF (congestive heart failure) ([place])   . Failure in dosage     chronic respiratory   . Dysrhythmia     svt  . Coronary artery disease   . Myocardial infarction ([place])     [id number],9/29  . Cardiomyopathy ([place])   . Diabetes mellitus without complication ([place])   . Hyperlipidemia     Patient Active Problem List   Diagnosis Date Noted  . Acute on chronic heart failure ([place]) 08/21/2015  . Obstructive sleep apnea 07/31/2015  . Tobacco use 07/31/2015  . Diabetes ([place]) 07/10/2015  . Chronic systolic heart failure ([place]) 03/30/2015  . Cardiac pacemaker 03/30/2015  . Chronic respiratory failure with hypercapnia ([place]) 12/12/2014  . Elevated troponin   . Non-ischemic cardiomyopathy ([place])   . Hypoxemia   . SVT (supraventricular tachycardia) ([place]) 12/09/2014  . NSTEMI (non-ST elevated myocardial infarction) ([place]) 12/09/2014  . Hypertension 12/09/2014    Past Surgical History  Procedure Laterality Date  . Left heart catheterization with coronary angiogram N/A 12/09/2014    Procedure: LEFT HEART CATHETERIZATION  WITH CORONARY ANGIOGRAM;  Surgeon: Burnell Blanks, MD;  Location: [facility] CATH LAB;  Service: Cardiovascular;  Laterality: N/A;  . Cardiac catheterization  12/11/2014    Procedure: RIGHT/LEFT HEART CATH AND CORONARY ANGIOGRAPHY;  Surgeon: Lorretta Harp, MD;  Location: [facility] CATH LAB;  Service: Cardiovascular;;  . Icd lead removal N/A 03/30/2015    Procedure: ICD LEAD REMOVAL;  Surgeon: Marzetta Board, MD;  Location: [facility] ORS;  Service: Cardiovascular;  Laterality: N/A;  . Implantable cardioverter defibrillator implant    . Insert / replace / remove pacemaker      Current Outpatient Rx  Name  Route  Sig  Dispense  Refill  . aspirin EC 325 MG tablet   Oral   Take 1 tablet (325 mg total) by mouth daily.   30 tablet   0   . atorvastatin (LIPITOR) 80 MG tablet   Oral   Take 1 tablet (80 mg total) by mouth daily at 6 PM. Patient taking differently: Take 80 mg by mouth every morning.    90 tablet   2   . carvedilol (COREG) 6.25 MG tablet   Oral   Take 1 tablet (6.25 mg total) by mouth 2 (two) times daily with a meal.   90 tablet   2   . cephALEXin (KEFLEX) 500 MG capsule   Oral   Take 1 capsule (500 mg total) by mouth 2 (two) times daily.   14 capsule   0   . furosemide (LASIX) 20 MG tablet   Oral  Take 1 tablet (20 mg total) by mouth daily.   30 tablet   0   . hydrALAZINE (APRESOLINE) 25 MG tablet   Oral   Take 1 tablet (25 mg total) by mouth 2 (two) times daily.   60 tablet   0   . HYDROcodone-acetaminophen (NORCO/VICODIN) 5-325 MG tablet   Oral   Take 1 tablet by mouth every 6 (six) hours as needed for moderate pain.   6 tablet   0   . insulin glargine (LANTUS) 100 UNIT/ML injection   Subcutaneous   Inject 0.1 mLs (10 Units total) into the skin daily. Patient taking differently: Inject 10 Units into the skin at bedtime.    10 mL   11   . insulin starter kit- syringes MISC   Other   1 kit by Other route once.   1 kit   0   . metFORMIN (GLUCOPHAGE) 1000  MG tablet   Oral   Take 1 tablet (1,000 mg total) by mouth 2 (two) times daily with a meal.   60 tablet   0   . omeprazole (PRILOSEC) 20 MG capsule   Oral   Take 1 capsule (20 mg total) by mouth daily.   30 capsule   0   . ondansetron (ZOFRAN ODT) 4 MG disintegrating tablet   Oral   Take 1 tablet (4 mg total) by mouth every 8 (eight) hours as needed for nausea or vomiting.   20 tablet   0   . oxyCODONE-acetaminophen (ROXICET) 5-325 MG tablet   Oral   Take 1 tablet by mouth every 4 (four) hours as needed for severe pain.   20 tablet   0   . potassium chloride SA (K-DUR,KLOR-CON) 20 MEQ tablet   Oral   Take 1 tablet (20 mEq total) by mouth daily.   30 tablet   0   . promethazine (PHENERGAN) 12.5 MG tablet   Oral   Take 2 tablets (25 mg total) by mouth every 6 (six) hours as needed for nausea or vomiting.   20 tablet   0   . sacubitril-valsartan (ENTRESTO) 24-26 MG   Oral   Take 1 tablet by mouth 2 (two) times daily.   180 tablet   3   . Syringe, Disposable, 1 ML MISC   Does not apply   0.1 mLs by Does not apply route as directed.   60 each   11     Allergies Review of patient's allergies indicates no known allergies.  Family History  Problem Relation Age of Onset  . Hypertension Mother   . Hypertension Sister   . Diabetes Sister   . Anemia Neg Hx   . Arrhythmia Neg Hx   . Asthma Neg Hx   . Clotting disorder Neg Hx   . Fainting Neg Hx   . Heart attack Neg Hx   . Heart disease Neg Hx   . Heart failure Neg Hx   . Hyperlipidemia Neg Hx     Social History Social History  Substance Use Topics  . Smoking status: Current Every Day Smoker -- 0.50 packs/day for 24 years    Types: Cigarettes  . Smokeless tobacco: Never Used  . Alcohol Use: No    Review of Systems  Constitutional: Negative for fever. Eyes: Negative for visual changes. ENT: Negative for sore throat. Cardiovascular: Negative for chest pain. Respiratory: Negative for shortness of  breath. Gastrointestinal: Positive for abdominal pain and nausea Genitourinary: Negative for dysuria. Musculoskeletal: Negative  for back pain. Skin: Negative for rash. Neurological: Negative for headaches, focal weakness or numbness.   10-point ROS otherwise negative.  ____________________________________________   PHYSICAL EXAM:  VITAL SIGNS: ED Triage Vitals  Enc Vitals Group     BP 10/02/15 2101 170/130 mmHg     Pulse Rate 10/02/15 2101 95     Resp 10/02/15 2101 22     Temp 10/02/15 2101 97.5 F (36.4 C)     Temp Source 10/02/15 2101 Oral     SpO2 10/02/15 2101 95 %     Weight 10/02/15 2101 325 lb (147.419 kg)     Height --      Head Cir --      Peak Flow --      Pain Score 10/02/15 2101 10     Pain Loc --      Pain Edu? --      Excl. in [place]? --      Constitutional: Alert and oriented. Well appearing and in no distress. Eyes: Conjunctivae are normal. PERRL. Normal extraocular movements. ENT   Head: Normocephalic and atraumatic.   Nose: No congestion/rhinnorhea.   Mouth/Throat: Mucous membranes are moist.   Neck: No stridor. Hematological/Lymphatic/Immunilogical: No cervical lymphadenopathy. Cardiovascular: Normal rate, regular rhythm. Normal and symmetric distal pulses are present in all extremities. No murmurs, rubs, or gallops. Respiratory: Normal respiratory effort without tachypnea nor retractions. Breath sounds are clear and equal bilaterally. No wheezes/rales/rhonchi. Gastrointestinal: Epigastric abdominal discomfort with palpation No distention. There is no CVA tenderness. Genitourinary: deferred Musculoskeletal: Nontender with normal range of motion in all extremities. No joint effusions.  No lower extremity tenderness nor edema. Neurologic:  Normal speech and language. No gross focal neurologic deficits are appreciated. Speech is normal.  Skin:  Skin is warm, dry and intact. No rash noted. Psychiatric: Mood and affect are normal. Speech and  behavior are normal. Patient exhibits appropriate insight and judgment.  ____________________________________________    LABS (pertinent positives/negatives)  Labs Reviewed  LIPASE, BLOOD - Abnormal; Notable for the following:    Lipase 83 (*)    All other components within normal limits  COMPREHENSIVE METABOLIC PANEL - Abnormal; Notable for the following:    Chloride 100 (*)    Glucose, Bld 187 (*)    Calcium 8.8 (*)    AST 52 (*)    Alkaline Phosphatase 191 (*)    All other components within normal limits  CBC  URINALYSIS COMPLETEWITH MICROSCOPIC ([facility] ONLY)     ____________________________________________   EKG  ED ECG REPORT I, Raylynne Cubbage, [place], the attending physician, personally viewed and interpreted this ECG.   Date: 10/04/2015  EKG Time: 11:21 PM  Rate: 95  Rhythm: Normal sinus rhythm with left ventricular hypertrophy  Axis: None  Intervals: Normal  ST&T Change: Lateral T-wave inversion     INITIAL IMPRESSION / ASSESSMENT AND PLAN / ED COURSE  Pertinent labs & imaging results that were available during my care of the patient were reviewed by me and considered in my medical decision making (see chart for details).  History physical exam and lab data consistent with pancreatitis. She received IV morphine and Zofran emergency department with pain improvement will discharge patient home with prescription for analgesia and antiemetic with recommendation for outpatient follow-up   ____________________________________________   FINAL CLINICAL IMPRESSION(S) / ED DIAGNOSES  Final diagnoses:  Acute pancreatitis, unspecified pancreatitis type      Gregor Hams, MD 10/04/15 [phone]

## 2015-10-03 NOTE — ED Notes (Signed)
This RN to subwait area to reassess patient's pain s/p IV Morphine 4mg . Patient reports that his pain has improved and now rates pain 4/10; decreased from 10/10. Will continue to monitor.

## 2015-10-03 NOTE — ED Notes (Signed)
EDP and this RN to see and assess patient in subwait area. Of note, MD has previously seen patient in consult. Patient continues to be somnolent following IV Morphine; easy to arouse to verbal and light tactile stimulation. Patient being discharged at this time - pain is well controlled and patient, able to tolerate PO fluids, and patient has been able to rest. RN to reassess and document VS. Patient's family left to go home. RN will place call to "Clarene Critchley" at [phone] to advise that patient ready to go home at this time. EDT asked to removed PIV.

## 2015-10-03 NOTE — ED Notes (Signed)
MD reviewed labs secondary to RN requesting pain medication for this patient. MD with order for Morphine 4mg  IVP and Zofran 4mg  IVP. Order to be entered and carried by this RN.

## 2015-10-03 NOTE — ED Notes (Signed)
Patient called this RN to subwait area. Patient advising now no to call his family; he advises that he is going to call a taxi. As previously stated, patient is stable - able to be awakened, converse appropriately with MD and RN, and is able to take PO fluids without difficulties. MD ok with patient discharging home in a taxi.

## 2015-10-18 ENCOUNTER — Ambulatory Visit: Payer: MEDICAID

## 2015-10-18 DIAGNOSIS — I509 Heart failure, unspecified: Secondary | ICD-10-CM | POA: Insufficient documentation

## 2015-10-18 LAB — HEPATIC FUNCTION PANEL
ALT: 37 U/L (ref 10–40)
AST: 38 U/L (ref 14–40)
Alkaline Phosphatase: 218 U/L — AB (ref 25–125)
BILIRUBIN, TOTAL: 0.3 mg/dL

## 2015-10-18 LAB — LIPID PANEL
Cholesterol: 272 mg/dL — AB (ref 0–200)
HDL: 34 mg/dL — AB (ref 35–70)
LDL CALC: 215 mg/dL
Triglycerides: 117 mg/dL (ref 40–160)

## 2015-10-18 LAB — BASIC METABOLIC PANEL
BUN: 11 mg/dL (ref 4–21)
Creatinine: 1 mg/dL (ref 0.6–1.3)
GLUCOSE: 126 mg/dL
Potassium: 5.2 mmol/L (ref 3.4–5.3)
SODIUM: 140 mmol/L (ref 137–147)

## 2015-10-18 LAB — HEMOGLOBIN A1C: Hemoglobin A1C: 7.5

## 2015-10-18 LAB — TSH: TSH: 1.19 u[IU]/mL (ref 0.41–5.90)

## 2015-10-18 LAB — MICROALBUMIN, URINE: MICROALB UR: 3041.1

## 2015-10-22 ENCOUNTER — Ambulatory Visit: Payer: Self-pay | Admitting: Family

## 2015-10-23 DIAGNOSIS — I509 Heart failure, unspecified: Secondary | ICD-10-CM

## 2015-10-26 ENCOUNTER — Encounter: Payer: Self-pay | Admitting: Emergency Medicine

## 2015-10-26 ENCOUNTER — Emergency Department
Admission: EM | Admit: 2015-10-26 | Discharge: 2015-10-26 | Disposition: A | Discharge status: Home / Self Care | Payer: Self-pay | Attending: Emergency Medicine | Admitting: Emergency Medicine

## 2015-10-26 DIAGNOSIS — Z7982 Long term (current) use of aspirin: Secondary | ICD-10-CM | POA: Insufficient documentation

## 2015-10-26 DIAGNOSIS — I10 Essential (primary) hypertension: Secondary | ICD-10-CM | POA: Insufficient documentation

## 2015-10-26 DIAGNOSIS — R300 Dysuria: Secondary | ICD-10-CM | POA: Insufficient documentation

## 2015-10-26 DIAGNOSIS — Z7984 Long term (current) use of oral hypoglycemic drugs: Secondary | ICD-10-CM | POA: Insufficient documentation

## 2015-10-26 DIAGNOSIS — Z792 Long term (current) use of antibiotics: Secondary | ICD-10-CM | POA: Insufficient documentation

## 2015-10-26 DIAGNOSIS — R1013 Epigastric pain: Secondary | ICD-10-CM | POA: Insufficient documentation

## 2015-10-26 DIAGNOSIS — E119 Type 2 diabetes mellitus without complications: Secondary | ICD-10-CM | POA: Insufficient documentation

## 2015-10-26 DIAGNOSIS — K219 Gastro-esophageal reflux disease without esophagitis: Secondary | ICD-10-CM | POA: Insufficient documentation

## 2015-10-26 DIAGNOSIS — F17210 Nicotine dependence, cigarettes, uncomplicated: Secondary | ICD-10-CM | POA: Insufficient documentation

## 2015-10-26 DIAGNOSIS — Z794 Long term (current) use of insulin: Secondary | ICD-10-CM | POA: Insufficient documentation

## 2015-10-26 LAB — COMPREHENSIVE METABOLIC PANEL
ALK PHOS: 171 U/L — AB (ref 38–126)
ALT: 34 U/L (ref 17–63)
ANION GAP: 7 (ref 5–15)
AST: 34 U/L (ref 15–41)
Albumin: 4 g/dL (ref 3.5–5.0)
BILIRUBIN TOTAL: 0.6 mg/dL (ref 0.3–1.2)
BUN: 21 mg/dL — ABNORMAL HIGH (ref 6–20)
CALCIUM: 9.3 mg/dL (ref 8.9–10.3)
CO2: 29 mmol/L (ref 22–32)
Chloride: 100 mmol/L — ABNORMAL LOW (ref 101–111)
Creatinine, Ser: 0.86 mg/dL (ref 0.61–1.24)
GFR calc non Af Amer: >60 mL/min (ref >60)
Glucose, Bld: 138 mg/dL — ABNORMAL HIGH (ref 65–99)
Potassium: 4.1 mmol/L (ref 3.5–5.1)
Sodium: 136 mmol/L (ref 135–145)
TOTAL PROTEIN: 8.8 g/dL — AB (ref 6.5–8.1)

## 2015-10-26 LAB — URINALYSIS COMPLETE WITH MICROSCOPIC (ARMC ONLY)
BILIRUBIN URINE: NEGATIVE
Bacteria, UA: NONE SEEN
Glucose, UA: 50 mg/dL — AB
Hgb urine dipstick: NEGATIVE
Nitrite: NEGATIVE
PH: 5 (ref 5.0–8.0)
Protein, ur: >500 mg/dL — AB
Specific Gravity, Urine: 1.024 (ref 1.005–1.030)

## 2015-10-26 LAB — LIPASE, BLOOD: Lipase: 55 U/L — ABNORMAL HIGH (ref 11–51)

## 2015-10-26 LAB — CBC
HCT: 49.9 % (ref 40.0–52.0)
HEMOGLOBIN: 17.5 g/dL (ref 13.0–18.0)
MCH: 32.5 pg (ref 26.0–34.0)
MCHC: 35 g/dL (ref 32.0–36.0)
MCV: 92.7 fL (ref 80.0–100.0)
Platelets: 177 10*3/uL (ref 150–440)
RBC: 5.38 MIL/uL (ref 4.40–5.90)
RDW: 13.8 % (ref 11.5–14.5)
WBC: 4.6 10*3/uL (ref 3.8–10.6)

## 2015-10-26 MED ORDER — OXYCODONE-ACETAMINOPHEN 5-325 MG PO TABS
2.0000 | ORAL_TABLET | ORAL | Status: AC
  Administered 2015-10-26: 2 via ORAL
  Filled 2015-10-26: qty 2

## 2015-10-26 MED ORDER — OXYCODONE-ACETAMINOPHEN 5-325 MG PO TABS: 1.0000 | ORAL_TABLET | Freq: Four times a day (QID) | ORAL | Status: DC | PRN

## 2015-10-26 MED ORDER — LEVOFLOXACIN 750 MG PO TABS: 750.0000 mg | ORAL_TABLET | Freq: Every day | ORAL | Status: DC

## 2015-10-26 NOTE — ED Notes (Signed)
Pt presents with abd pain, hx of pancreatitis. Seen here recently for same. No acute distress noted.

## 2015-10-26 NOTE — Discharge Instructions (Signed)
You were seen in the emergency room for abdominal pain. It is important that you follow up closely with a gastroenterology doctor in the next couple of days.  Please return to the emergency room right away if you are to develop a fever, severe nausea, your pain becomes severe or worsens, you are unable to keep food down, begin vomiting any dark or bloody fluid, you develop any dark or bloody stools, feel dehydrated, or other new concerns or symptoms arise.   Abdominal Pain, Adult Many things can cause abdominal pain. Usually, abdominal pain is not caused by a disease and will improve without treatment. It can often be observed and treated at home. Your health care provider will do a physical exam and possibly order blood tests and X-rays to help determine the seriousness of your pain. However, in many cases, more time must pass before a clear cause of the pain can be found. Before that point, your health care provider may not know if you need more testing or further treatment. HOME CARE INSTRUCTIONS Monitor your abdominal pain for any changes. The following actions may help to alleviate any discomfort you are experiencing:  Only take over-the-counter or prescription medicines as directed by your health care provider.  Do not take laxatives unless directed to do so by your health care provider.  Try a clear liquid diet (broth, tea, or water) as directed by your health care provider. Slowly move to a bland diet as tolerated. SEEK MEDICAL CARE IF:  You have unexplained abdominal pain.  You have abdominal pain associated with nausea or diarrhea.  You have pain when you urinate or have a bowel movement.  You experience abdominal pain that wakes you in the night.  You have abdominal pain that is worsened or improved by eating food.  You have abdominal pain that is worsened with eating fatty foods.  You have a fever. SEEK IMMEDIATE MEDICAL CARE IF:  Your pain does not go away within 2  hours.  You keep throwing up (vomiting).  Your pain is felt only in portions of the abdomen, such as the right side or the left lower portion of the abdomen.  You pass bloody or black tarry stools. MAKE SURE YOU:  Understand these instructions.  Will watch your condition.  Will get help right away if you are not doing well or get worse.   This information is not intended to replace advice given to you by your health care provider. Make sure you discuss any questions you have with your health care provider.   Document Released: 05/28/2005 Document Revised: 05/09/2015 Document Reviewed: 04/27/2013 Elsevier Interactive Patient Education 2016 [employer]  Dysuria Dysuria is pain or discomfort while urinating. The pain or discomfort may be felt in the tube that carries urine out of the bladder (urethra) or in the surrounding tissue of the genitals. The pain may also be felt in the groin area, lower abdomen, and lower back. You may have to urinate frequently or have the sudden feeling that you have to urinate (urgency). Dysuria can affect both men and women, but is more common in women. Dysuria can be caused by many different things, including:  Urinary tract infection in women.  Infection of the kidney or bladder.  Kidney stones or bladder stones.  Certain sexually transmitted infections (STIs), such as chlamydia.  Dehydration.  Inflammation of the vagina.  Use of certain medicines.  Use of certain soaps or scented products that cause irritation. HOME CARE INSTRUCTIONS Watch your  dysuria for any changes. The following actions may help to reduce any discomfort you are feeling:  Drink enough fluid to keep your urine clear or pale yellow.  Empty your bladder often. Avoid holding urine for long periods of time.  After a bowel movement or urination, women should cleanse from front to back, using each tissue only once.  Empty your bladder after sexual intercourse.  Take  medicines only as directed by your health care provider.  If you were prescribed an antibiotic medicine, finish it all even if you start to feel better.  Avoid caffeine, tea, and alcohol. They can irritate the bladder and make dysuria worse. In men, alcohol may irritate the prostate.  Keep all follow-up visits as directed by your health care provider. This is important.  If you had any tests done to find the cause of dysuria, it is your responsibility to obtain your test results. Ask the lab or department performing the test when and how you will get your results. Talk with your health care provider if you have any questions about your results. SEEK MEDICAL CARE IF:  You develop pain in your back or sides.  You have a fever.  You have nausea or vomiting.  You have blood in your urine.  You are not urinating as often as you usually do. SEEK IMMEDIATE MEDICAL CARE IF:  You pain is severe and not relieved with medicines.  You are unable to hold down any fluids.  You or someone else notices a change in your mental function.  You have a rapid heartbeat at rest.  You have shaking or chills.  You feel extremely weak.   This information is not intended to replace advice given to you by your health care provider. Make sure you discuss any questions you have with your health care provider.   Document Released: 05/16/2004 Document Revised: 09/08/2014 Document Reviewed: 04/13/2014 Elsevier Interactive Patient Education [employer].

## 2015-10-26 NOTE — ED Notes (Signed)
Patient arrives from home with c/o periumbilical bilateral abd pain for 7 days. Seen here prior for same and diagnosed with pancreatitis. [facility] and prescribed cefdinir for 'some sort of kidney infection'. Patient has stopped his home medications due to ABD Pain. Denis N/V/D

## 2015-10-26 NOTE — ED Provider Notes (Addendum)
[facility] Emergency Department Provider Note  ____________________________________________  Time seen: Approximately 12:28 PM  I have reviewed the triage vital signs and the nursing notes.   HISTORY  Chief Complaint Abdominal Pain    HPI Oscar [name] is a 40 y.o. male percents for evaluation of recurrent abdominal pain.  Patient reports he was diagnosed with pancreatitis about a month ago. This has improved, and now he reports that he still has ongoing discomfort in the upper abdomen. He states that after eating especially notices pain across his upper abdomen. In addition he was having some trouble with urinary symptoms of week ago and was placed on a cefdinir. He reports he still continues to have some slight urinary symptoms.  Denies vomiting, no nausea. He reports his pain improves when he is not eating, seems to get a little worse when he eats. He reports had ultrasounds 3 or 4 times of his gallbladder in the past, has also had previous CT scan for similar and same issues which seem to recur.  Currently states he has mild to moderate discomfort  Past Medical History  Diagnosis Date  . Hypertension   . GERD (gastroesophageal reflux disease)   . Sleep apnea     osa  . Hypoxemia   . CHF (congestive heart failure) ([place])   . Failure in dosage     chronic respiratory   . Dysrhythmia     svt  . Coronary artery disease   . Myocardial infarction ([place])     [id number],2/94  . Cardiomyopathy ([place])   . Diabetes mellitus without complication ([place])   . Hyperlipidemia   . Asthma   . Deafness in right ear     Patient Active Problem List   Diagnosis Date Noted  . Congestive heart failure ([place]) 10/18/2015  . Acute on chronic heart failure ([place]) 08/21/2015  . Obstructive sleep apnea 07/31/2015  . Tobacco use 07/31/2015  . Diabetes ([place]) 07/10/2015  . Chronic systolic heart failure ([place]) 03/30/2015  . Cardiac pacemaker 03/30/2015  . Chronic  respiratory failure with hypercapnia ([place]) 12/12/2014  . Elevated troponin   . Non-ischemic cardiomyopathy ([place])   . Hypoxemia   . SVT (supraventricular tachycardia) ([place]) 12/09/2014  . NSTEMI (non-ST elevated myocardial infarction) ([place]) 12/09/2014  . Hypertension 12/09/2014    Past Surgical History  Procedure Laterality Date  . Left heart catheterization with coronary angiogram N/A 12/09/2014    Procedure: LEFT HEART CATHETERIZATION WITH CORONARY ANGIOGRAM;  Surgeon: Burnell Blanks, MD;  Location[facility] CATH LAB;  Service: Cardiovascular;  Laterality: N/A;  . Cardiac catheterization  12/11/2014    Procedure: RIGHT/LEFT HEART CATH AND CORONARY ANGIOGRAPHY;  Surgeon: Lorretta Harp, MD;  Location: [facility] CATH LAB;  Service: Cardiovascular;;  . Icd lead removal N/A 03/30/2015    Procedure: ICD LEAD REMOVAL;  Surgeon: Marzetta Board, MD;  Location: [facility] ORS;  Service: Cardiovascular;  Laterality: N/A;  . Implantable cardioverter defibrillator implant    . Insert / replace / remove pacemaker      Current Outpatient Rx  Name  Route  Sig  Dispense  Refill  . amLODipine (NORVASC) 5 MG tablet   Oral   Take 5 mg by mouth daily.         Marland Kitchen aspirin EC 325 MG tablet   Oral   Take 1 tablet (325 mg total) by mouth daily.   30 tablet   0   . atorvastatin (LIPITOR) 80 MG tablet   Oral  Take 1 tablet (80 mg total) by mouth daily at 6 PM. Patient taking differently: Take 80 mg by mouth every morning.    90 tablet   2   . carvedilol (COREG) 6.25 MG tablet   Oral   Take 1 tablet (6.25 mg total) by mouth 2 (two) times daily with a meal.   90 tablet   2   . cephALEXin (KEFLEX) 500 MG capsule   Oral   Take 1 capsule (500 mg total) by mouth 2 (two) times daily.   14 capsule   0   . furosemide (LASIX) 20 MG tablet   Oral   Take 1 tablet (20 mg total) by mouth daily.   30 tablet   0   . hydrALAZINE (APRESOLINE) 25 MG tablet   Oral   Take 1 tablet (25 mg total) by mouth 2 (two)  times daily.   60 tablet   0   . insulin glargine (LANTUS) 100 UNIT/ML injection   Subcutaneous   Inject 0.1 mLs (10 Units total) into the skin daily. Patient taking differently: Inject 10 Units into the skin at bedtime.    10 mL   11   . insulin starter kit- syringes MISC   Other   1 kit by Other route once.   1 kit   0   . levofloxacin (LEVAQUIN) 750 MG tablet   Oral   Take 1 tablet (750 mg total) by mouth daily.   6 tablet   0   . lisinopril (PRINIVIL,ZESTRIL) 40 MG tablet   Oral   Take 40 mg by mouth daily.         . metFORMIN (GLUCOPHAGE) 1000 MG tablet   Oral   Take 1 tablet (1,000 mg total) by mouth 2 (two) times daily with a meal.   60 tablet   0   . omeprazole (PRILOSEC) 20 MG capsule   Oral   Take 1 capsule (20 mg total) by mouth daily.   30 capsule   0   . ondansetron (ZOFRAN ODT) 4 MG disintegrating tablet   Oral   Take 1 tablet (4 mg total) by mouth every 8 (eight) hours as needed for nausea or vomiting.   20 tablet   0   . oxyCODONE-acetaminophen (ROXICET) 5-325 MG tablet   Oral   Take 1 tablet by mouth every 6 (six) hours as needed for severe pain.   10 tablet   0   . potassium chloride SA (K-DUR,KLOR-CON) 20 MEQ tablet   Oral   Take 1 tablet (20 mEq total) by mouth daily.   30 tablet   0   . promethazine (PHENERGAN) 12.5 MG tablet   Oral   Take 2 tablets (25 mg total) by mouth every 6 (six) hours as needed for nausea or vomiting.   20 tablet   0   . sacubitril-valsartan (ENTRESTO) 24-26 MG   Oral   Take 1 tablet by mouth 2 (two) times daily.   180 tablet   3   . Syringe, Disposable, 1 ML MISC   Does not apply   0.1 mLs by Does not apply route as directed.   60 each   11     Allergies Review of patient's allergies indicates no known allergies.  Family History  Problem Relation Age of Onset  . Hypertension Mother   . Congestive Heart Failure Mother   . Hypertension Sister   . Diabetes Sister   . Pancreatitis Sister    .  COPD Sister   . Anemia Neg Hx   . Arrhythmia Neg Hx   . Asthma Neg Hx   . Clotting disorder Neg Hx   . Fainting Neg Hx   . Heart attack Neg Hx   . Heart disease Neg Hx   . Heart failure Neg Hx   . Hyperlipidemia Neg Hx   . Pancreatitis Brother     Social History Social History  Substance Use Topics  . Smoking status: Current Every Day Smoker -- 0.50 packs/day for 24 years    Types: Cigarettes  . Smokeless tobacco: Never Used  . Alcohol Use: No    Review of Systems Constitutional: No fever/chills Eyes: No visual changes. ENT: No sore throat. Cardiovascular: Denies chest pain. Respiratory: Denies shortness of breath. Gastrointestinal: No vomiting.  No diarrhea.  No constipation. Normal bowel movements. Genitourinary: Some dysuria, says improved somewhat antibiotic however has been about a week and still having some burning. Musculoskeletal: Negative for back pain. Skin: Negative for rash. Neurological: Negative for headaches, focal weakness or numbness.  10-point ROS otherwise negative.  ____________________________________________   PHYSICAL EXAM:  VITAL SIGNS: ED Triage Vitals  Enc Vitals Group     BP 10/26/15 0920 175/130 mmHg     Pulse Rate 10/26/15 0920 91     Resp 10/26/15 0920 18     Temp 10/26/15 0920 97.6 F (36.4 C)     Temp Source 10/26/15 0920 Oral     SpO2 10/26/15 0920 93 %     Weight 10/26/15 0920 344 lb (156.037 kg)     Height 10/26/15 0920 5' 4" (1.626 m)     Head Cir --      Peak Flow --      Pain Score 10/26/15 0921 8     Pain Loc --      Pain Edu? --      Excl. in [place]? --    Constitutional: Alert and oriented. Well appearing and in no acute distress. Patient sitting up in chair, drinking less water and watching television. Eyes: Conjunctivae are normal. PERRL. EOMI. Head: Atraumatic. Nose: No congestion/rhinnorhea. Mouth/Throat: Mucous membranes are moist.  Oropharynx non-erythematous. Neck: No stridor.   Cardiovascular: Normal  rate, regular rhythm. Grossly normal heart sounds.  Good peripheral circulation. Respiratory: Normal respiratory effort.  No retractions. Lungs CTAB. Gastrointestinal: Soft and nontender except for moderate discomfort to deep palpation in the epigastrium area negative Murphy. No right lower quadrant pain. No rebound or guarding. No distention. No abdominal bruits. No CVA tenderness. Musculoskeletal: No lower extremity tenderness nor edema.  No joint effusions. Neurologic:  Normal speech and language. No gross focal neurologic deficits are appreciated. No gait instability. Skin:  Skin is warm, dry and intact. No rash noted. Psychiatric: Mood and affect are normal. Speech and behavior are normal.  ____________________________________________   LABS (all labs ordered are listed, but only abnormal results are displayed)  Labs Reviewed  LIPASE, BLOOD - Abnormal; Notable for the following:    Lipase 55 (*)    All other components within normal limits  COMPREHENSIVE METABOLIC PANEL - Abnormal; Notable for the following:    Chloride 100 (*)    Glucose, Bld 138 (*)    BUN 21 (*)    Total Protein 8.8 (*)    Alkaline Phosphatase 171 (*)    All other components within normal limits  URINALYSIS COMPLETEWITH MICROSCOPIC ([facility] ONLY) - Abnormal; Notable for the following:    Color, Urine AMBER (*)    APPearance  HAZY (*)    Glucose, UA 50 (*)    Ketones, ur TRACE (*)    Protein, ur >500 (*)    Leukocytes, UA 1+ (*)    Squamous Epithelial / LPF 0-5 (*)    All other components within normal limits  CBC   ____________________________________________  EKG  Reviewed and interpreted by me at 1240 Heart rate 80 QRS 1:30 QTC 500 , Left bundle branch block, probable left ventricular hypertrophy As compared with the previous EKG the patient had earlier this month no significant changes noted. T-wave abnormality noted in lateral precordial leads and limb  leads ____________________________________________  RADIOLOGY   ____________________________________________   PROCEDURES  Procedure(s) performed: None  Critical Care performed: No  ____________________________________________   INITIAL IMPRESSION / ASSESSMENT AND PLAN / ED COURSE  Pertinent labs & imaging results that were available during my care of the patient were reviewed by me and considered in my medical decision making (see chart for details).  Differential diagnosis includes but is not limited to, abdominal perforation, aortic dissection, cholecystitis, appendicitis, diverticulitis, colitis, esophagitis/gastritis, kidney stone, pyelonephritis, urinary tract infection, aortic aneurysm. All are considered in decision and treatment plan. Based upon the patient's presentation and risk factors reports he's had same pain multiple times, last seen about a month ago. He's had previous ultrasound in the last 6 months showed no gallstones. He does have very minimally elevated lipase, and his previous evaluation visit seem to be consistent with acute pancreatitis. At the present time is suspect probably mild ongoing symptoms of pancreatitis, question if he maybe develops some chronic pancreatitis. His benign abdominal exam is quite reassuring except for epigastric discomfort. Hemoglobin stable. He is hemodynamically stable, afebrile. No signs or symptoms of bowel obstruction. Discussed with the patient repeating ultrasound or obtaining a CT scan to further evaluate for cause of his pain but he states he has had these before and does not feel that he would like to have this done again. In reviewing his records, I think this is reasonable. I will treat his pain, and have strongly advised her to follow-up with gastroenterology.  No cardiopulmonary symptoms  Review of urinalysis is concerning for possible ongoing urinary tract infection given number of white cells. I will place him on Levaquin,  and order culture.   FINAL CLINICAL IMPRESSION(S) / ED DIAGNOSES  Final diagnoses:  Dysuria  Epigastric abdominal pain      Delman Kitten, MD 10/26/15 1244  I will prescribe the patient a narcotic pain medicine due to their condition which I anticipate will cause at least moderate pain short term. I discussed with the patient safe use of narcotic pain medicines, and that they are not to drive, work in dangerous areas, or ever take more than prescribed (no more than 1 pill every 6 hours). We discussed that this is the type of medication that can be  overdosed on and the risks of this type of medicine. Patient is very agreeable to only use as prescribed and to never use more than prescribed.   Delman Kitten, MD 10/26/15 [id number]

## 2015-11-01 ENCOUNTER — Ambulatory Visit: Payer: Self-pay | Attending: Family | Admitting: Family

## 2015-11-01 ENCOUNTER — Encounter: Payer: Self-pay | Admitting: Family

## 2015-11-01 ENCOUNTER — Other Ambulatory Visit: Payer: MEDICAID

## 2015-11-01 VITALS — BP 148/86 | HR 86 | Resp 18 | Ht 67.0 in | Wt 329.0 lb

## 2015-11-01 DIAGNOSIS — I2510 Atherosclerotic heart disease of native coronary artery without angina pectoris: Secondary | ICD-10-CM | POA: Insufficient documentation

## 2015-11-01 DIAGNOSIS — Z7982 Long term (current) use of aspirin: Secondary | ICD-10-CM | POA: Insufficient documentation

## 2015-11-01 DIAGNOSIS — R002 Palpitations: Secondary | ICD-10-CM | POA: Insufficient documentation

## 2015-11-01 DIAGNOSIS — I252 Old myocardial infarction: Secondary | ICD-10-CM | POA: Insufficient documentation

## 2015-11-01 DIAGNOSIS — K219 Gastro-esophageal reflux disease without esophagitis: Secondary | ICD-10-CM | POA: Insufficient documentation

## 2015-11-01 DIAGNOSIS — I5022 Chronic systolic (congestive) heart failure: Secondary | ICD-10-CM

## 2015-11-01 DIAGNOSIS — I429 Cardiomyopathy, unspecified: Secondary | ICD-10-CM | POA: Insufficient documentation

## 2015-11-01 DIAGNOSIS — N390 Urinary tract infection, site not specified: Secondary | ICD-10-CM | POA: Insufficient documentation

## 2015-11-01 DIAGNOSIS — E785 Hyperlipidemia, unspecified: Secondary | ICD-10-CM | POA: Insufficient documentation

## 2015-11-01 DIAGNOSIS — Z794 Long term (current) use of insulin: Secondary | ICD-10-CM | POA: Insufficient documentation

## 2015-11-01 DIAGNOSIS — R1011 Right upper quadrant pain: Secondary | ICD-10-CM

## 2015-11-01 DIAGNOSIS — G4730 Sleep apnea, unspecified: Secondary | ICD-10-CM | POA: Insufficient documentation

## 2015-11-01 DIAGNOSIS — I10 Essential (primary) hypertension: Secondary | ICD-10-CM

## 2015-11-01 DIAGNOSIS — F17210 Nicotine dependence, cigarettes, uncomplicated: Secondary | ICD-10-CM | POA: Insufficient documentation

## 2015-11-01 DIAGNOSIS — J45909 Unspecified asthma, uncomplicated: Secondary | ICD-10-CM | POA: Insufficient documentation

## 2015-11-01 DIAGNOSIS — Z79899 Other long term (current) drug therapy: Secondary | ICD-10-CM | POA: Insufficient documentation

## 2015-11-01 DIAGNOSIS — E119 Type 2 diabetes mellitus without complications: Secondary | ICD-10-CM

## 2015-11-01 DIAGNOSIS — Z720 Tobacco use: Secondary | ICD-10-CM

## 2015-11-01 NOTE — Progress Notes (Signed)
Subjective:    Patient ID: Oscar [name], male    DOB: [birth year], 40 y.o.   MRN: [id number]  Congestive Heart Failure Presents for follow-up visit. The disease course has been stable. Associated symptoms include abdominal pain, edema (minimal), fatigue, palpitations and shortness of breath. Pertinent negatives include no chest pain or orthopnea. The symptoms have been stable. Past treatments include ACE inhibitors, beta blockers, oxygen and salt and fluid restriction. The treatment provided mild relief. Compliance with prior treatments has been variable. Prior compliance problems include difficulty understanding directions. His past medical history is significant for CAD, chronic lung disease, DM and HTN. Compliance with total regimen is 76-100%.  Hypertension This is a chronic problem. The current episode started more than 1 year ago. The problem has been waxing and waning since onset. Associated symptoms include palpitations, peripheral edema and shortness of breath. Pertinent negatives include no chest pain, headaches or neck pain. There are no associated agents to hypertension. Risk factors for coronary artery disease include diabetes mellitus, dyslipidemia, family history, male gender, obesity, sedentary lifestyle and smoking/tobacco exposure. Past treatments include ACE inhibitors, beta blockers, calcium channel blockers, diuretics and lifestyle changes. The current treatment provides mild improvement. Compliance problems include exercise.  Hypertensive end-organ damage includes CAD/MI and heart failure.    Past Medical History  Diagnosis Date  . Hypertension   . GERD (gastroesophageal reflux disease)   . Sleep apnea     osa  . Hypoxemia   . CHF (congestive heart failure) ([place])   . Failure in dosage     chronic respiratory   . Dysrhythmia     svt  . Coronary artery disease   . Myocardial infarction ([place])     [id number],1/66  . Cardiomyopathy ([place])   . Diabetes mellitus without  complication ([place])   . Hyperlipidemia   . Asthma   . Deafness in right ear     Past Surgical History  Procedure Laterality Date  . Left heart catheterization with coronary angiogram N/A 12/09/2014    Procedure: LEFT HEART CATHETERIZATION WITH CORONARY ANGIOGRAM;  Surgeon: Burnell Blanks, MD;  Location: [facility] CATH LAB;  Service: Cardiovascular;  Laterality: N/A;  . Cardiac catheterization  12/11/2014    Procedure: RIGHT/LEFT HEART CATH AND CORONARY ANGIOGRAPHY;  Surgeon: Lorretta Harp, MD;  Location: [facility] CATH LAB;  Service: Cardiovascular;;  . Icd lead removal N/A 03/30/2015    Procedure: ICD LEAD REMOVAL;  Surgeon: Marzetta Board, MD;  Location: [facility] ORS;  Service: Cardiovascular;  Laterality: N/A;  . Implantable cardioverter defibrillator implant    . Insert / replace / remove pacemaker      Family History  Problem Relation Age of Onset  . Hypertension Mother   . Congestive Heart Failure Mother   . Hypertension Sister   . Diabetes Sister   . Pancreatitis Sister   . COPD Sister   . Anemia Neg Hx   . Arrhythmia Neg Hx   . Asthma Neg Hx   . Clotting disorder Neg Hx   . Fainting Neg Hx   . Heart attack Neg Hx   . Heart disease Neg Hx   . Heart failure Neg Hx   . Hyperlipidemia Neg Hx   . Pancreatitis Brother     Social History  Substance Use Topics  . Smoking status: Current Every Day Smoker -- 0.50 packs/day for 24 years    Types: Cigarettes  . Smokeless tobacco: Never Used  . Alcohol Use: No    No Known  Allergies  Prior to Admission medications   Medication Sig Start Date End Date Taking? Authorizing Provider  amLODipine (NORVASC) 5 MG tablet Take 5 mg by mouth daily. 10/18/15  Yes Historical Provider, MD  aspirin EC 325 MG tablet Take 1 tablet (325 mg total) by mouth daily. 07/10/15  Yes Aldean Jewett, MD  atorvastatin (LIPITOR) 80 MG tablet Take 1 tablet (80 mg total) by mouth daily at 6 PM. Patient taking differently: Take 80 mg by mouth every morning.   07/10/15  Yes Aldean Jewett, MD  carvedilol (COREG) 6.25 MG tablet Take 1 tablet (6.25 mg total) by mouth 2 (two) times daily with a meal. 07/10/15  Yes Aldean Jewett, MD  cefdinir (OMNICEF) 300 MG capsule Take 300 mg by mouth 2 (two) times daily.   Yes Historical Provider, MD  furosemide (LASIX) 20 MG tablet Take 1 tablet (20 mg total) by mouth daily. 07/10/15  Yes Aldean Jewett, MD  hydrALAZINE (APRESOLINE) 25 MG tablet Take 1 tablet (25 mg total) by mouth 2 (two) times daily. 07/10/15  Yes Aldean Jewett, MD  insulin glargine (LANTUS) 100 UNIT/ML injection Inject 0.1 mLs (10 Units total) into the skin daily. Patient taking differently: Inject 10 Units into the skin at bedtime.  08/21/15  Yes Alisa Graff, FNP  insulin starter kit- syringes MISC 1 kit by Other route once. 07/10/15  Yes Aldean Jewett, MD  potassium chloride SA (K-DUR,KLOR-CON) 20 MEQ tablet Take 1 tablet (20 mEq total) by mouth daily. 07/10/15  Yes Aldean Jewett, MD  Syringe, Disposable, 1 ML MISC 0.1 mLs by Does not apply route as directed. 08/21/15  Yes Alisa Graff, FNP  levofloxacin (LEVAQUIN) 750 MG tablet Take 1 tablet (750 mg total) by mouth daily. Patient not taking: Reported on 11/01/2015 10/26/15   Delman Kitten, MD  omeprazole (PRILOSEC) 20 MG capsule Take 1 capsule (20 mg total) by mouth daily. Patient not taking: Reported on 11/01/2015 07/10/15   Aldean Jewett, MD  sacubitril-valsartan (ENTRESTO) 24-26 MG Take 1 tablet by mouth 2 (two) times daily. Patient not taking: Reported on 11/01/2015 08/21/15   Alisa Graff, FNP     Review of Systems  Constitutional: Positive for appetite change and fatigue.  HENT: Positive for rhinorrhea. Negative for congestion and sore throat.   Eyes: Negative.   Respiratory: Positive for cough, chest tightness and shortness of breath.   Cardiovascular: Positive for palpitations and leg swelling (minimal ). Negative for chest pain.  Gastrointestinal: Positive for  abdominal pain. Negative for abdominal distention.  Endocrine: Negative.   Genitourinary: Positive for dysuria and hematuria.  Musculoskeletal: Positive for back pain. Negative for neck pain.  Skin: Negative.   Allergic/Immunologic: Negative.   Neurological: Positive for dizziness and light-headedness. Negative for headaches.  Hematological: Negative for adenopathy. Does not bruise/bleed easily.  Psychiatric/Behavioral: Positive for sleep disturbance (trouble sleeping due to abdominal pain). Negative for dysphoric mood. The patient is nervous/anxious (due to not knowing what abdominal pain is from).        Objective:   Physical Exam  Constitutional: He is oriented to person, place, and time. He appears well-developed and well-nourished.  HENT:  Head: Normocephalic and atraumatic.  Eyes: Conjunctivae are normal. Pupils are equal, round, and reactive to light.  Neck: Normal range of motion. Neck supple.  Cardiovascular: Normal rate and regular rhythm.   Pulmonary/Chest: Effort normal. He has no wheezes. He has no rales.  Abdominal: Soft. He exhibits no distension.  There is tenderness.  Musculoskeletal: He exhibits edema (trace amount pitting edema in bilateral lower legs). He exhibits no tenderness.  Neurological: He is alert and oriented to person, place, and time.  Skin: Skin is warm and dry.  Psychiatric: He has a normal mood and affect. His behavior is normal. Thought content normal.  Nursing note and vitals reviewed.   BP 148/86 mmHg  Pulse 86  Resp 18  Ht _0  (1.702 m)  Wt 329 lb (149.233 kg)  BMI 51.52 kg/m2  SpO2 99%       Assessment & Plan:  1: Chronic heart failure with reduced ejection fraction- Patient presents with fatigue and shortness of breath upon exertion. He says that he was a little short of breath upon walking into the office but once he sat down, his symptoms improved. He has noticed minimal swelling in his lower legs. He continues to weigh himself and  has noticed a gradual weight loss because he's not eating much. By our scale, he's lost 15 pounds since 08/21/15. Reminded to call for an overnight weight gain of >2 pounds or a weekly weight gain of >5 pounds. He is not adding any salt to his foods and tries to eat low sodium items. He hasn't begun his entresto yet because he was finishing out his lisinopril. He says that he took the last dose of lisinopril yesterday afternoon around 1pm. Advised patient to begin taking entresto 24/42m twice daily on 11/02/15. Will plan on checking lab work at his next visit. Need to also increase carvedilol and add bidil if possible at future visits.  2: HTN- Blood pressure elevated initially but better on recheck. Changing to entresto. 3: Diabetes- Fasting glucose yesterday was 120. He says that he's stopped taking metformin because it gave him diarrhea. Once he stopped it, the diarrhea went away. Encouraged him to follow up with his PCP regarding his diabetes.  4: RUQ pain- Patient has been to the ER on a few occasions due to pain and he's been told it could be pancreatitis or his gallbladder. Has also been diagnosed with a UTI for which he's been taking antibiotics for. Continues to have abdominal pain. Has an appointment with his PCP later today. 5: Tobacco use- Patient says that he's decreased his smoking and now smokes about 1/2 pack of cigarettes/week. Complete cessation discussed with the patient for 3 minutes.  Patient did not bring his medications with him so I'm not completely sure we know what he's taking. Discussed the importance of bringing his medications bottles with him to every visit every time and to every provider.  Return here in 1 month or sooner for any questions/problems before then.

## 2015-11-01 NOTE — Patient Instructions (Addendum)
Continue weighing daily and call for an overnight weight gain of > 2 pounds or a weekly weight gain of >5 pounds.  Begin taking entresto 24/26mg  twice daily beginning tomorrow morning. Do NOT resume lisinopril  Bring medications to every visit with every provider.

## 2015-11-02 DIAGNOSIS — R109 Unspecified abdominal pain: Secondary | ICD-10-CM | POA: Insufficient documentation

## 2015-11-07 ENCOUNTER — Other Ambulatory Visit: Payer: Self-pay | Admitting: Gastroenterology

## 2015-11-07 ENCOUNTER — Ambulatory Visit
Admission: RE | Admit: 2015-11-07 | Discharge: 2015-11-07 | Disposition: A | Discharge status: Home / Self Care | Payer: Self-pay | Source: Ambulatory Visit | Attending: Gastroenterology | Admitting: Gastroenterology

## 2015-11-07 DIAGNOSIS — K420 Umbilical hernia with obstruction, without gangrene: Secondary | ICD-10-CM

## 2015-11-07 DIAGNOSIS — K429 Umbilical hernia without obstruction or gangrene: Secondary | ICD-10-CM | POA: Insufficient documentation

## 2015-11-07 MED ORDER — IOHEXOL 350 MG/ML SOLN
100.0000 mL | Freq: Once | INTRAVENOUS | Status: AC | PRN
  Administered 2015-11-07: 100 mL via INTRAVENOUS

## 2015-11-08 ENCOUNTER — Ambulatory Visit: Payer: MEDICAID | Admitting: Nurse Practitioner

## 2015-11-08 VITALS — BP 119/81 | HR 81 | Wt 272.0 lb

## 2015-11-08 DIAGNOSIS — I10 Essential (primary) hypertension: Secondary | ICD-10-CM

## 2015-11-08 DIAGNOSIS — K5900 Constipation, unspecified: Secondary | ICD-10-CM

## 2015-11-08 DIAGNOSIS — I509 Heart failure, unspecified: Secondary | ICD-10-CM

## 2015-11-08 DIAGNOSIS — E119 Type 2 diabetes mellitus without complications: Secondary | ICD-10-CM

## 2015-11-08 DIAGNOSIS — Z794 Long term (current) use of insulin: Secondary | ICD-10-CM

## 2015-11-08 NOTE — Assessment & Plan Note (Addendum)
Pt had CT scan - umbilical hernia appears stable. Pt advised to eat lots of vegetables. Pt advised to get generic stool softeners from [employer] and take 1-2 twice a day.

## 2015-11-08 NOTE — Progress Notes (Signed)
   Subjective:    Patient ID: Oscar [name], male    DOB: [birth year], 40 y.o.   MRN: [id number]  HPI  Pt presents with a f/u for hypertension. Pt is taking amlodopine 5 mg, carvedilol 6.25, hydralazine 35 mg,   Pt presents with a f/u for congestive heart failure. Pt is taking carvedilol 6.26, furosemide 20 mg, Ertresto 24-26 mg. Pt    Pt presents with a f/u for diabetes. Pt is taking insulin, Lantus 100 units/mL injection 10 units daily at bedtime. Last A1c was 11.7. Blood glucose this evening was 143. Pt is checking the blood sugar twice a day (in the morning and at night before he goes to bed). It has been running in the 140s.   Pt smokes 1/2 pack of cigarettes a day.  Pt had CT (3/8),  Review of Systems  Respiratory: Positive for cough.   Gastrointestinal: Positive for constipation.  Hematological: Negative for adenopathy.       Objective:   Physical Exam  Constitutional: He is oriented to person, place, and time. He appears well-developed.  HENT:  Head: Normocephalic.  Neck: No thyromegaly present.  Pulmonary/Chest: Respiratory distress: mild.  Musculoskeletal: Edema: slight.  Neurological: He is alert and oriented to person, place, and time.  No evidence of carotid bruits. Bilateral breath sounds mildly diminished Mildly tachycardic    BP 119/81 mmHg  Pulse 81  Wt 272 lb (123.378 kg)  Blood glucose 143.      Assessment & Plan:   No problem-specific assessment & plan notes found for this encounter.  Pt advised that we can prescribe nicotine inhalers when pt is ready.  Was given dental referral today.   Labs tonight: CMP, A1c, lipid panel MD f/u: 2 weeks - check on foot sensation

## 2015-11-08 NOTE — Assessment & Plan Note (Signed)
Pt is taking insulin. Last A1c was in October (11.7).

## 2015-11-15 ENCOUNTER — Other Ambulatory Visit: Payer: MEDICAID

## 2015-11-20 ENCOUNTER — Telehealth: Payer: Self-pay

## 2015-11-20 NOTE — Telephone Encounter (Signed)
[facility]- He can google [facility] coming up to find the locations or call [phone] or [phone]. Per Ria Comment [facility] Dental Coordinator

## 2015-11-22 ENCOUNTER — Ambulatory Visit: Payer: MEDICAID

## 2015-11-22 ENCOUNTER — Encounter: Payer: Self-pay | Admitting: Internal Medicine

## 2015-11-22 ENCOUNTER — Inpatient Hospital Stay
Admission: EM | Admit: 2015-11-22 | Discharge: 2015-11-23 | DRG: 313 | Disposition: A | Discharge status: Home / Self Care | Payer: Medicaid Other | Attending: Internal Medicine | Admitting: Internal Medicine

## 2015-11-22 ENCOUNTER — Emergency Department: Payer: Self-pay

## 2015-11-22 ENCOUNTER — Encounter: Payer: Self-pay | Admitting: *Deleted

## 2015-11-22 DIAGNOSIS — I509 Heart failure, unspecified: Secondary | ICD-10-CM

## 2015-11-22 DIAGNOSIS — J9610 Chronic respiratory failure, unspecified whether with hypoxia or hypercapnia: Secondary | ICD-10-CM | POA: Diagnosis present

## 2015-11-22 DIAGNOSIS — E876 Hypokalemia: Secondary | ICD-10-CM | POA: Diagnosis present

## 2015-11-22 DIAGNOSIS — Z95810 Presence of automatic (implantable) cardiac defibrillator: Secondary | ICD-10-CM

## 2015-11-22 DIAGNOSIS — Z794 Long term (current) use of insulin: Secondary | ICD-10-CM

## 2015-11-22 DIAGNOSIS — I252 Old myocardial infarction: Secondary | ICD-10-CM

## 2015-11-22 DIAGNOSIS — Z7982 Long term (current) use of aspirin: Secondary | ICD-10-CM

## 2015-11-22 DIAGNOSIS — I110 Hypertensive heart disease with heart failure: Secondary | ICD-10-CM | POA: Diagnosis present

## 2015-11-22 DIAGNOSIS — F17210 Nicotine dependence, cigarettes, uncomplicated: Secondary | ICD-10-CM | POA: Diagnosis present

## 2015-11-22 DIAGNOSIS — I471 Supraventricular tachycardia: Secondary | ICD-10-CM

## 2015-11-22 DIAGNOSIS — I214 Non-ST elevation (NSTEMI) myocardial infarction: Secondary | ICD-10-CM | POA: Diagnosis present

## 2015-11-22 DIAGNOSIS — I10 Essential (primary) hypertension: Secondary | ICD-10-CM

## 2015-11-22 DIAGNOSIS — R0789 Other chest pain: Principal | ICD-10-CM | POA: Diagnosis present

## 2015-11-22 DIAGNOSIS — Z8249 Family history of ischemic heart disease and other diseases of the circulatory system: Secondary | ICD-10-CM

## 2015-11-22 DIAGNOSIS — R7989 Other specified abnormal findings of blood chemistry: Secondary | ICD-10-CM

## 2015-11-22 DIAGNOSIS — E119 Type 2 diabetes mellitus without complications: Secondary | ICD-10-CM | POA: Diagnosis present

## 2015-11-22 DIAGNOSIS — J45909 Unspecified asthma, uncomplicated: Secondary | ICD-10-CM | POA: Diagnosis present

## 2015-11-22 DIAGNOSIS — Z9981 Dependence on supplemental oxygen: Secondary | ICD-10-CM

## 2015-11-22 DIAGNOSIS — R000 Tachycardia, unspecified: Secondary | ICD-10-CM | POA: Diagnosis present

## 2015-11-22 DIAGNOSIS — Z6841 Body Mass Index (BMI) 40.0 and over, adult: Secondary | ICD-10-CM

## 2015-11-22 DIAGNOSIS — I5022 Chronic systolic (congestive) heart failure: Secondary | ICD-10-CM | POA: Diagnosis present

## 2015-11-22 DIAGNOSIS — R778 Other specified abnormalities of plasma proteins: Secondary | ICD-10-CM

## 2015-11-22 DIAGNOSIS — I200 Unstable angina: Secondary | ICD-10-CM

## 2015-11-22 DIAGNOSIS — G4733 Obstructive sleep apnea (adult) (pediatric): Secondary | ICD-10-CM | POA: Diagnosis present

## 2015-11-22 DIAGNOSIS — K219 Gastro-esophageal reflux disease without esophagitis: Secondary | ICD-10-CM | POA: Diagnosis present

## 2015-11-22 DIAGNOSIS — R079 Chest pain, unspecified: Secondary | ICD-10-CM

## 2015-11-22 DIAGNOSIS — E669 Obesity, unspecified: Secondary | ICD-10-CM | POA: Diagnosis present

## 2015-11-22 DIAGNOSIS — Z79899 Other long term (current) drug therapy: Secondary | ICD-10-CM

## 2015-11-22 DIAGNOSIS — H9191 Unspecified hearing loss, right ear: Secondary | ICD-10-CM | POA: Diagnosis present

## 2015-11-22 DIAGNOSIS — I2510 Atherosclerotic heart disease of native coronary artery without angina pectoris: Secondary | ICD-10-CM | POA: Diagnosis present

## 2015-11-22 DIAGNOSIS — E785 Hyperlipidemia, unspecified: Secondary | ICD-10-CM | POA: Diagnosis present

## 2015-11-22 DIAGNOSIS — Z833 Family history of diabetes mellitus: Secondary | ICD-10-CM

## 2015-11-22 LAB — COMPREHENSIVE METABOLIC PANEL
ALBUMIN: 3.6 g/dL (ref 3.5–5.0)
ALT: 54 U/L (ref 17–63)
ANION GAP: 9 (ref 5–15)
AST: 59 U/L — ABNORMAL HIGH (ref 15–41)
Alkaline Phosphatase: 174 U/L — ABNORMAL HIGH (ref 38–126)
BUN: 27 mg/dL — ABNORMAL HIGH (ref 6–20)
CALCIUM: 8.6 mg/dL — AB (ref 8.9–10.3)
CHLORIDE: 99 mmol/L — AB (ref 101–111)
CO2: 25 mmol/L (ref 22–32)
Creatinine, Ser: 1.39 mg/dL — ABNORMAL HIGH (ref 0.61–1.24)
GFR calc non Af Amer: >60 mL/min (ref >60)
GLUCOSE: 203 mg/dL — AB (ref 65–99)
POTASSIUM: 3.4 mmol/L — AB (ref 3.5–5.1)
SODIUM: 133 mmol/L — AB (ref 135–145)
Total Bilirubin: 0.8 mg/dL (ref 0.3–1.2)
Total Protein: 7.8 g/dL (ref 6.5–8.1)

## 2015-11-22 LAB — CBC WITH DIFFERENTIAL/PLATELET
BASOS PCT: 1 %
Basophils Absolute: 0 10*3/uL (ref 0–0.1)
EOS ABS: 0.2 10*3/uL (ref 0–0.7)
EOS PCT: 4 %
HCT: 47.4 % (ref 40.0–52.0)
HEMOGLOBIN: 16.5 g/dL (ref 13.0–18.0)
LYMPHS ABS: 1.8 10*3/uL (ref 1.0–3.6)
Lymphocytes Relative: 37 %
MCH: 32.6 pg (ref 26.0–34.0)
MCHC: 34.8 g/dL (ref 32.0–36.0)
MCV: 93.9 fL (ref 80.0–100.0)
MONOS PCT: 6 %
Monocytes Absolute: 0.3 10*3/uL (ref 0.2–1.0)
NEUTROS PCT: 52 %
Neutro Abs: 2.6 10*3/uL (ref 1.4–6.5)
PLATELETS: 155 10*3/uL (ref 150–440)
RBC: 5.04 MIL/uL (ref 4.40–5.90)
RDW: 13.3 % (ref 11.5–14.5)
WBC: 5 10*3/uL (ref 3.8–10.6)

## 2015-11-22 LAB — PROTIME-INR
INR: 1.13
PROTHROMBIN TIME: 14.7 s (ref 11.4–15.0)

## 2015-11-22 LAB — TROPONIN I
TROPONIN I: 0.16 ng/mL — AB (ref <0.031)
Troponin I: 0.16 ng/mL — ABNORMAL HIGH (ref <0.031)
Troponin I: 0.14 ng/mL — ABNORMAL HIGH (ref <0.031)

## 2015-11-22 LAB — GLUCOSE, CAPILLARY
Glucose-Capillary: 137 mg/dL — ABNORMAL HIGH (ref 65–99)
Glucose-Capillary: 165 mg/dL — ABNORMAL HIGH (ref 65–99)
Glucose-Capillary: 117 mg/dL — ABNORMAL HIGH (ref 65–99)
Glucose-Capillary: 215 mg/dL — ABNORMAL HIGH (ref 65–99)

## 2015-11-22 LAB — URINE DRUG SCREEN, QUALITATIVE (ARMC ONLY)
AMPHETAMINES, UR SCREEN: NOT DETECTED
Barbiturates, Ur Screen: NOT DETECTED
Benzodiazepine, Ur Scrn: NOT DETECTED
CANNABINOID 50 NG, UR ~~LOC~~: NOT DETECTED
COCAINE METABOLITE, UR ~~LOC~~: NOT DETECTED
MDMA (ECSTASY) UR SCREEN: NOT DETECTED
Methadone Scn, Ur: NOT DETECTED
Opiate, Ur Screen: NOT DETECTED
PHENCYCLIDINE (PCP) UR S: NOT DETECTED
Tricyclic, Ur Screen: NOT DETECTED

## 2015-11-22 LAB — BRAIN NATRIURETIC PEPTIDE: B NATRIURETIC PEPTIDE 5: 100 pg/mL (ref 0.0–100.0)

## 2015-11-22 LAB — HEPARIN LEVEL (UNFRACTIONATED): HEPARIN UNFRACTIONATED: 0.51 [IU]/mL (ref 0.30–0.70)

## 2015-11-22 LAB — ETHANOL: Alcohol, Ethyl (B): <5 mg/dL (ref <5)

## 2015-11-22 LAB — LIPASE, BLOOD: LIPASE: 30 U/L (ref 11–51)

## 2015-11-22 LAB — TSH: TSH: 1.602 u[IU]/mL (ref 0.350–4.500)

## 2015-11-22 LAB — HEMOGLOBIN A1C: Hgb A1c MFr Bld: 7.1 % — ABNORMAL HIGH (ref 4.0–6.0)

## 2015-11-22 LAB — APTT: APTT: 29 s (ref 24–36)

## 2015-11-22 MED ORDER — HEPARIN SODIUM (PORCINE) 5000 UNIT/ML IJ SOLN
60.0000 [IU]/kg | Freq: Once | INTRAMUSCULAR | Status: DC
  Administered 2015-11-22: 5000 [IU] via INTRAVENOUS
  Filled 2015-11-22: qty 2

## 2015-11-22 MED ORDER — HEPARIN (PORCINE) IN NACL 100-0.45 UNIT/ML-% IJ SOLN: 14.0000 [IU]/kg/h | Freq: Once | INTRAMUSCULAR | Status: DC

## 2015-11-22 MED ORDER — ACETAMINOPHEN 325 MG PO TABS: 650.0000 mg | ORAL_TABLET | Freq: Four times a day (QID) | ORAL | Status: DC | PRN

## 2015-11-22 MED ORDER — CARVEDILOL 6.25 MG PO TABS
6.2500 mg | ORAL_TABLET | Freq: Two times a day (BID) | ORAL | Status: DC
  Administered 2015-11-22 – 2015-11-23 (×3): 6.25 mg via ORAL
  Filled 2015-11-22 (×3): qty 1

## 2015-11-22 MED ORDER — OXYCODONE-ACETAMINOPHEN 5-325 MG PO TABS: 1.0000 | ORAL_TABLET | ORAL | Status: DC | PRN

## 2015-11-22 MED ORDER — POTASSIUM CHLORIDE CRYS ER 20 MEQ PO TBCR: 40.0000 meq | EXTENDED_RELEASE_TABLET | Freq: Every day | ORAL | Status: DC

## 2015-11-22 MED ORDER — ACETAMINOPHEN 650 MG RE SUPP: 650.0000 mg | Freq: Four times a day (QID) | RECTAL | Status: DC | PRN

## 2015-11-22 MED ORDER — POTASSIUM CHLORIDE CRYS ER 20 MEQ PO TBCR
40.0000 meq | EXTENDED_RELEASE_TABLET | Freq: Every day | ORAL | Status: DC
  Administered 2015-11-23: 40 meq via ORAL
  Filled 2015-11-22: qty 2

## 2015-11-22 MED ORDER — SODIUM CHLORIDE 0.9% FLUSH
3.0000 mL | Freq: Two times a day (BID) | INTRAVENOUS | Status: DC
  Administered 2015-11-22 – 2015-11-23 (×3): 3 mL via INTRAVENOUS

## 2015-11-22 MED ORDER — SODIUM CHLORIDE 0.9 % IV SOLN: INTRAVENOUS | Status: DC

## 2015-11-22 MED ORDER — MAGNESIUM SULFATE 2 GM/50ML IV SOLN
2.0000 g | Freq: Once | INTRAVENOUS | Status: AC
  Administered 2015-11-22: 2 g via INTRAVENOUS
  Filled 2015-11-22: qty 50

## 2015-11-22 MED ORDER — PANTOPRAZOLE SODIUM 40 MG PO TBEC
40.0000 mg | DELAYED_RELEASE_TABLET | Freq: Every day | ORAL | Status: DC
  Administered 2015-11-22 – 2015-11-23 (×2): 40 mg via ORAL
  Filled 2015-11-22 (×2): qty 1

## 2015-11-22 MED ORDER — HEPARIN SODIUM (PORCINE) 5000 UNIT/ML IJ SOLN
INTRAMUSCULAR | Status: AC
  Administered 2015-11-22: 5000 [IU] via INTRAVENOUS
  Filled 2015-11-22: qty 1

## 2015-11-22 MED ORDER — NITROGLYCERIN 2 % TD OINT
1.0000 [in_us] | TOPICAL_OINTMENT | Freq: Once | TRANSDERMAL | Status: AC
  Administered 2015-11-22: 1 [in_us] via TOPICAL
  Filled 2015-11-22: qty 1

## 2015-11-22 MED ORDER — HYDRALAZINE HCL 25 MG PO TABS
25.0000 mg | ORAL_TABLET | Freq: Two times a day (BID) | ORAL | Status: DC
  Administered 2015-11-22 – 2015-11-23 (×3): 25 mg via ORAL
  Filled 2015-11-22 (×3): qty 1

## 2015-11-22 MED ORDER — HEPARIN (PORCINE) IN NACL 100-0.45 UNIT/ML-% IJ SOLN
1300.0000 [IU]/h | INTRAMUSCULAR | Status: DC
  Administered 2015-11-22: 1300 [IU]/h via INTRAVENOUS
  Filled 2015-11-22: qty 250

## 2015-11-22 MED ORDER — INSULIN GLARGINE 100 UNIT/ML ~~LOC~~ SOLN
6.0000 [IU] | Freq: Every day | SUBCUTANEOUS | Status: DC
  Administered 2015-11-22: 6 [IU] via SUBCUTANEOUS
  Filled 2015-11-22 (×2): qty 0.06

## 2015-11-22 MED ORDER — HEPARIN BOLUS VIA INFUSION
4000.0000 [IU] | Freq: Once | INTRAVENOUS | Status: DC
  Filled 2015-11-22: qty 4000

## 2015-11-22 MED ORDER — ONDANSETRON HCL 4 MG/2ML IJ SOLN: 4.0000 mg | Freq: Four times a day (QID) | INTRAMUSCULAR | Status: DC | PRN

## 2015-11-22 MED ORDER — AMLODIPINE BESYLATE 5 MG PO TABS
5.0000 mg | ORAL_TABLET | Freq: Every day | ORAL | Status: DC
  Administered 2015-11-22 – 2015-11-23 (×2): 5 mg via ORAL
  Filled 2015-11-22 (×2): qty 1

## 2015-11-22 MED ORDER — ASPIRIN EC 325 MG PO TBEC
325.0000 mg | DELAYED_RELEASE_TABLET | Freq: Every day | ORAL | Status: DC
Start: 2015-11-22 — End: 2015-11-23
  Administered 2015-11-22 – 2015-11-23 (×2): 325 mg via ORAL
  Filled 2015-11-22 (×2): qty 1

## 2015-11-22 MED ORDER — POTASSIUM CHLORIDE CRYS ER 20 MEQ PO TBCR
40.0000 meq | EXTENDED_RELEASE_TABLET | Freq: Once | ORAL | Status: AC
  Administered 2015-11-22: 40 meq via ORAL
  Filled 2015-11-22: qty 2

## 2015-11-22 MED ORDER — MORPHINE SULFATE (PF) 2 MG/ML IV SOLN: 2.0000 mg | INTRAVENOUS | Status: DC | PRN

## 2015-11-22 MED ORDER — ATORVASTATIN CALCIUM 20 MG PO TABS
80.0000 mg | ORAL_TABLET | ORAL | Status: DC
  Administered 2015-11-22 – 2015-11-23 (×2): 80 mg via ORAL
  Filled 2015-11-22 (×3): qty 4

## 2015-11-22 MED ORDER — INSULIN ASPART 100 UNIT/ML ~~LOC~~ SOLN
0.0000 [IU] | Freq: Three times a day (TID) | SUBCUTANEOUS | Status: DC
  Administered 2015-11-22 – 2015-11-23 (×2): 3 [IU] via SUBCUTANEOUS
  Filled 2015-11-22 (×2): qty 3

## 2015-11-22 MED ORDER — SACUBITRIL-VALSARTAN 24-26 MG PO TABS
1.0000 | ORAL_TABLET | Freq: Two times a day (BID) | ORAL | Status: DC
  Administered 2015-11-22 – 2015-11-23 (×3): 1 via ORAL
  Filled 2015-11-22 (×3): qty 1

## 2015-11-22 MED ORDER — ONDANSETRON HCL 4 MG PO TABS: 4.0000 mg | ORAL_TABLET | Freq: Four times a day (QID) | ORAL | Status: DC | PRN

## 2015-11-22 MED ORDER — FUROSEMIDE 20 MG PO TABS
20.0000 mg | ORAL_TABLET | Freq: Every day | ORAL | Status: DC
  Administered 2015-11-22 – 2015-11-23 (×2): 20 mg via ORAL
  Filled 2015-11-22 (×2): qty 1

## 2015-11-22 MED ORDER — ASPIRIN 81 MG PO CHEW
324.0000 mg | CHEWABLE_TABLET | Freq: Once | ORAL | Status: AC
  Administered 2015-11-22: 324 mg via ORAL
  Filled 2015-11-22: qty 4

## 2015-11-22 MED ORDER — DOCUSATE SODIUM 100 MG PO CAPS
100.0000 mg | ORAL_CAPSULE | Freq: Two times a day (BID) | ORAL | Status: DC
  Administered 2015-11-22 – 2015-11-23 (×3): 100 mg via ORAL
  Filled 2015-11-22 (×3): qty 1

## 2015-11-22 NOTE — Consult Note (Signed)
The patient's device was interrogated. It demonstrated NO SHOCK ECG reviews demonstrated SVT. Presumably then the SVTs terminated spontaneously associated with a startle that was described by the patient is a shock.  Let me knmow if i can be of further assistance

## 2015-11-22 NOTE — Progress Notes (Signed)
Went to check on the patient at around 1740 and could not find patient anywhere in his room. Checked all the way down the halls on the 2nd floor and could not find the patient. Notified switch board and a security alert missing person was called. Patient showing up on the heart monitor the whole time. Patient was downstairs in the cafeteria and was brought back to his room. Patient is now sitting in the chair and has been informed he cannot leave his room to go the cafeteria while a patient.

## 2015-11-22 NOTE — Progress Notes (Signed)
Pharmacy tech is to follow up for patient's home medication list. All rest of admission is complete.

## 2015-11-22 NOTE — ED Notes (Signed)
Admitting MD in to evaluate patient for admission.

## 2015-11-22 NOTE — Progress Notes (Addendum)
ANTICOAGULATION CONSULT NOTE - Initial Consult  Pharmacy Consult for heparin drip Indication: ACS/STEMI  No Known Allergies  Patient Measurements: Height: 5\' 6"  (167.6 cm) Weight: (!) 325 lb (147.419 kg) IBW/kg (Calculated) : 63.8 Heparin Dosing Weight: 100kg  Vital Signs: Temp Source: Oral (03/23 0339) BP: 142/102 mmHg (03/23 0339) Pulse Rate: 99 (03/23 0339)  Labs:  Recent Labs  11/22/15 0339  HGB 16.5  HCT 47.4  PLT 155  LABPROT 14.7  INR 1.13  CREATININE 1.39*  TROPONINI 0.16*    Estimated Creatinine Clearance: 97.1 mL/min (by C-G formula based on Cr of 1.39).   Medical History: Past Medical History  Diagnosis Date  . Hypertension   . GERD (gastroesophageal reflux disease)   . Sleep apnea     osa  . Hypoxemia   . CHF (congestive heart failure) ([place])   . Failure in dosage     chronic respiratory   . Dysrhythmia     svt  . Coronary artery disease   . Myocardial infarction ([place])     [id number]  . Cardiomyopathy ([place])   . Diabetes mellitus without complication ([place])   . Hyperlipidemia   . Asthma   . Deafness in right ear     Medications:    Assessment: APTT pending   INR 1.13 No outpatient anticoag in med rec.   Goal of Therapy:  Heparin level 0.3-0.7 units/ml Monitor platelets by anticoagulation protocol: Yes   Plan:  8850 unit bolus given in ED. Will start infusion at initial rate of 1300 units/hr. First heparin level 6 hours after start of infusion.  Dinia Joynt S 11/22/2015,4:57 AM

## 2015-11-22 NOTE — Progress Notes (Signed)
Patient ID: Oscar [name], male   DOB: [birth year], 40 y.o.   MRN: [id number] [facility] PROGRESS NOTE  Oscar [name] [id number] DOB: October 24, 1975 DOA: 11/22/2015 PCP: No PCP Per Patient  HPI/Subjective: Patient and chest soreness. He thinks his defibrillator went off. Some slight cough.  Objective: Filed Vitals:   11/22/15 0833 11/22/15 1151  BP: 132/90 112/65  Pulse: 69 79  Temp: 97.4 F (36.3 C) 97.5 F (36.4 C)  Resp: 18 18    Filed Weights   11/22/15 0339 11/22/15 0700  Weight: 147.419 kg (325 lb) 148.372 kg (327 lb 1.6 oz)    ROS: Review of Systems  Constitutional: Negative for fever and chills.  Eyes: Negative for blurred vision.  Respiratory: Positive for cough. Negative for shortness of breath.   Cardiovascular: Positive for chest pain.  Gastrointestinal: Negative for nausea, vomiting, abdominal pain, diarrhea and constipation.  Genitourinary: Negative for dysuria.  Musculoskeletal: Negative for joint pain.  Neurological: Negative for dizziness and headaches.   Exam: Physical Exam  Constitutional: He is oriented to person, place, and time.  HENT:  Nose: No mucosal edema.  Mouth/Throat: No oropharyngeal exudate or posterior oropharyngeal edema.  Eyes: Conjunctivae, EOM and lids are normal. Pupils are equal, round, and reactive to light.  Neck: No JVD present. Carotid bruit is not present. No edema present. No thyroid mass and no thyromegaly present.  Cardiovascular: S1 normal and S2 normal.  Exam reveals no gallop.   Murmur heard.  Systolic murmur is present with a grade of 2/6  Pulses:      Dorsalis pedis pulses are 2+ on the right side, and 2+ on the left side.  Respiratory: No respiratory distress. He has no wheezes. He has no rhonchi. He has no rales.  GI: Soft. Bowel sounds are normal. There is no tenderness.  Musculoskeletal:       Right ankle: He exhibits swelling.       Left ankle: He exhibits swelling.  Lymphadenopathy:    He  has no cervical adenopathy.  Neurological: He is alert and oriented to person, place, and time. No cranial nerve deficit.  Skin: Skin is warm. No rash noted. Nails show no clubbing.  Psychiatric: He has a normal mood and affect.      Data Reviewed: Basic Metabolic Panel:  Recent Labs Lab 11/22/15 0339  NA 133*  K 3.4*  CL 99*  CO2 25  GLUCOSE 203*  BUN 27*  CREATININE 1.39*  CALCIUM 8.6*   Liver Function Tests:  Recent Labs Lab 11/22/15 0339  AST 59*  ALT 54  ALKPHOS 174*  BILITOT 0.8  PROT 7.8  ALBUMIN 3.6    Recent Labs Lab 11/22/15 0339  LIPASE 30   CBC:  Recent Labs Lab 11/22/15 0339  WBC 5.0  NEUTROABS 2.6  HGB 16.5  HCT 47.4  MCV 93.9  PLT 155   Cardiac Enzymes:  Recent Labs Lab 11/22/15 0339 11/22/15 0818 11/22/15 1346  TROPONINI 0.16* 0.16* 0.14*   BNP (last 3 results)  Recent Labs  12/10/14 0007 07/09/15 0755 11/22/15 0339  BNP 60.8 128.0* 100.0    CBG:  Recent Labs Lab 11/22/15 0814 11/22/15 1149 11/22/15 1623  GLUCAP 137* 165* 117*      Studies: Dg Chest Port 1 View  11/22/2015  CLINICAL DATA:  Sudden onset chest pain and diaphoresis.  Nausea. EXAM: PORTABLE CHEST 1 VIEW COMPARISON:  09/01/2015 FINDINGS: Cardiac pacemaker. Cardiac enlargement with hazy perihilar opacities suggesting edema. No blunting of costophrenic  angles. No pneumothorax. No focal consolidation. IMPRESSION: Cardiac enlargement with mild perihilar edema. Electronically Signed   By: Lucienne Capers M.D.   On: 11/22/2015 03:50    Scheduled Meds: . amLODipine  5 mg Oral Daily  . aspirin EC  325 mg Oral Daily  . atorvastatin  80 mg Oral [id number]  . carvedilol  6.25 mg Oral BID WC  . docusate sodium  100 mg Oral BID  . furosemide  20 mg Oral Daily  . hydrALAZINE  25 mg Oral BID  . insulin aspart  0-15 Units Subcutaneous TID WC  . insulin glargine  6 Units Subcutaneous QHS  . pantoprazole  40 mg Oral Daily  . [START ON 11/23/2015] potassium  chloride SA  40 mEq Oral Daily  . sacubitril-valsartan  1 tablet Oral BID  . sodium chloride flush  3 mL Intravenous Q12H    Assessment/Plan:  1. Elevated troponin and chest pain. This is not a myocardial infarction. Discontinue heparin drip. Continue aspirin and cardiac meds. 2. Patient states that his defibrillator fired. Interrogate the defibrillator. Likely tachyarrhythmia. 3. Chronic systolic congestive heart failure. Continue Entresto, Lasix, Coreg, hydralazine. 4. Type 2 diabetes mellitus continue Levemir insulin 5. Obesity and sleep apnea weight loss needed 6. Chronic respiratory failure on oxygen 2 L nasal cannula 7. Essential hypertension. Continue usual medications 8. Hyperlipidemia unspecified continue atorvastatin  Code Status:     Code Status Orders        Start     Ordered   11/22/15 0754  Full code   Continuous     11/22/15 0753    Code Status History    Date Active Date Inactive Code Status Order ID Comments User Context   07/09/2015 12:28 PM 07/10/2015  7:34 PM Full Code [id number]  Fritzi Mandes, MD Inpatient   03/30/2015  4:57 PM 03/31/2015  8:18 PM Full Code [id number]  Marzetta Board, MD Inpatient   12/11/2014  5:20 PM 12/13/2014  4:18 PM Full Code [id number]  Lorretta Harp, MD Inpatient   12/09/2014 11:15 PM 12/11/2014  5:20 PM Full Code [id number]  Jules Husbands, MD Inpatient     Disposition Plan: Likely home tomorrow  Consultants:  Cardiology  Time spent: 35 minutes  Loletha Grayer  [facility]

## 2015-11-22 NOTE — Progress Notes (Signed)
ANTICOAGULATION CONSULT NOTE - FOLLOW UP   Pharmacy Consult for heparin drip Indication: ACS/STEMI  No Known Allergies  Patient Measurements: Height: 5\' 6"  (167.6 cm) Weight: (!) 327 lb 1.6 oz (148.372 kg) IBW/kg (Calculated) : 63.8 Heparin Dosing Weight: 100kg  Vital Signs: Temp: 97.5 F (36.4 C) (03/23 1151) Temp Source: Oral (03/23 0833) BP: 112/65 mmHg (03/23 1151) Pulse Rate: 79 (03/23 1151)  Labs:  Recent Labs  11/22/15 0339 11/22/15 0818 11/22/15 1217 11/22/15 1346  HGB 16.5  --   --   --   HCT 47.4  --   --   --   PLT 155  --   --   --   APTT 29  --   --   --   LABPROT 14.7  --   --   --   INR 1.13  --   --   --   HEPARINUNFRC  --   --  0.51  --   CREATININE 1.39*  --   --   --   TROPONINI 0.16* 0.16*  --  0.14*    Estimated Creatinine Clearance: 97.5 mL/min (by C-G formula based on Cr of 1.39).   Medical History: Past Medical History  Diagnosis Date  . Hypertension   . GERD (gastroesophageal reflux disease)   . Sleep apnea     osa  . Hypoxemia   . CHF (congestive heart failure) ([place])   . Failure in dosage     chronic respiratory   . Dysrhythmia     svt  . Coronary artery disease   . Myocardial infarction ([place])     [id number]  . Cardiomyopathy ([place])   . Diabetes mellitus without complication ([place])   . Hyperlipidemia   . Asthma   . Deafness in right ear     Medications:    Assessment: Pharmacy consulted to dose and monitor heparin therapy in this 40 year old male being treated for NSTEMI.   Goal of Therapy:  Heparin level 0.3-0.7 units/ml Monitor platelets by anticoagulation protocol: Yes   Plan:  Heparin level resulted @ 0.51, which is therapeutic; therefore will continue the same rate. Next heparin level ordered to be drawn @1830 .    Oscar [name] D 11/22/2015,2:25 PM

## 2015-11-22 NOTE — ED Notes (Signed)
Pt presents to ED by EMS with sudden onset of chest pain while lying in bed. Pain radiates to left arm. Pt diaphoretic and nauseated. Pt with initial rate of 156. EMS report pt defib fired at 310 with a resulting rate of 90. Adenosine of 6 and then 12 had been given by EMS prior to that with no change.

## 2015-11-22 NOTE — Progress Notes (Signed)
Inpatient Diabetes Program Recommendations  AACE/ADA: New Consensus Statement on Inpatient Glycemic Control (2015)  Target Ranges:  Prepandial:   less than 140 mg/dL      Peak postprandial:   less than 180 mg/dL (1-2 hours)      Critically ill patients:  140 - 180 mg/dL   Review of Glycemic Control  Results for MAMORU, HERRIG (MRN [id number]) as of 11/22/2015 14:52  Ref. Range 11/22/2015 08:14 11/22/2015 11:49  Glucose-Capillary Latest Ref Range: 65-99 mg/dL 137 (H) 165 (H)    Diabetes history: Type 2 Outpatient Diabetes medications: Lantus 10 units qhs Current orders for Inpatient glycemic control:Lantus 6 units qhs, Novolog 0-15 units tid with meals  Inpatient Diabetes Program Recommendations:  Agree with current orders for insulin administration.  Elevated CBG at lunch because patient refused insulin at breakfast.  Gentry Fitz, RN, BA, MHA, CDE Diabetes Coordinator Inpatient Diabetes Program  [phone] (Team Pager) [phone] (Tool) 11/22/2015 2:54 PM

## 2015-11-22 NOTE — Progress Notes (Signed)
Patient admitted today from home with sister and brother-in-law. Patient is very drowsy this AM, but is arousable to voice and answers questions appropriately. Tele verified by night shift and skin assessment checked with Ronny Flurry, RN. Patient is complaining of some soreness in his chest from where they believe his ICD fired. Heparin drip currently running at 13. Cardiology is to see patient. Will continue to monitor.

## 2015-11-22 NOTE — ED Notes (Signed)
Lab called to report critical troponin of 0.16

## 2015-11-22 NOTE — Progress Notes (Signed)
Patient went and got two large cheeseburgers, french fries, chips, and sunkist down in the cafeteria. Patient also had his whole dinner tray delivered to him earlier. Patient now knows he cannot leave and go downstairs.

## 2015-11-22 NOTE — ED Provider Notes (Signed)
[facility] Emergency Department Provider Note  ____________________________________________  Time seen: Approximately 3:28 AM  I have reviewed the triage vital signs and the nursing notes.   HISTORY  Chief Complaint Chest pain   HPI Oscar [name] is a 40 y.o. male who presents to the ED from home via EMS with a chief complain of chest pain. Patient has a medical history significant for CAD s/p MI, CHF with AICD. States he was laying in bed approximately one hour ago when he had onset of left-sided chest pressure. EMS reports upon their arrival patient was extremely diaphoretic and tachycardic to the 150s. EMS administered 6 mg then 12 mg of IV adenosine without affect. Subsequently patient's AICD fired and he arrives to the ED in normal sinus rhythm. Denies recent fever, chills, cough, congestion, shortness of breath, abdominal pain, nausea, vomiting, diarrhea. Denies recent travel or trauma.   Past Medical History  Diagnosis Date  . Hypertension   . GERD (gastroesophageal reflux disease)   . Sleep apnea     osa  . Hypoxemia   . CHF (congestive heart failure) ([place])   . Failure in dosage     chronic respiratory   . Dysrhythmia     svt  . Coronary artery disease   . Myocardial infarction ([place])     [id number],8/25  . Cardiomyopathy ([place])   . Diabetes mellitus without complication ([place])   . Hyperlipidemia   . Asthma   . Deafness in right ear     Patient Active Problem List   Diagnosis Date Noted  . Constipation 11/08/2015  . Abdominal pain 11/02/2015  . Congestive heart failure ([place]) 10/18/2015  . Obstructive sleep apnea 07/31/2015  . Tobacco use 07/31/2015  . Diabetes ([place]) 07/10/2015  . Chronic systolic heart failure ([place]) 03/30/2015  . Cardiac pacemaker 03/30/2015  . Chronic respiratory failure with hypercapnia ([place]) 12/12/2014  . Elevated troponin   . Non-ischemic cardiomyopathy ([place])   . Hypoxemia   . SVT (supraventricular tachycardia)  ([place]) 12/09/2014  . NSTEMI (non-ST elevated myocardial infarction) ([place]) 12/09/2014  . Hypertension 12/09/2014    Past Surgical History  Procedure Laterality Date  . Left heart catheterization with coronary angiogram N/A 12/09/2014    Procedure: LEFT HEART CATHETERIZATION WITH CORONARY ANGIOGRAM;  Surgeon: Burnell Blanks, MD;  Location: [facility] CATH LAB;  Service: Cardiovascular;  Laterality: N/A;  . Cardiac catheterization  12/11/2014    Procedure: RIGHT/LEFT HEART CATH AND CORONARY ANGIOGRAPHY;  Surgeon: Lorretta Harp, MD;  Location: [facility] CATH LAB;  Service: Cardiovascular;;  . Icd lead removal N/A 03/30/2015    Procedure: ICD LEAD REMOVAL;  Surgeon: Marzetta Board, MD;  Location: [facility] ORS;  Service: Cardiovascular;  Laterality: N/A;  . Implantable cardioverter defibrillator implant    . Insert / replace / remove pacemaker      Current Outpatient Rx  Name  Route  Sig  Dispense  Refill  . amLODipine (NORVASC) 5 MG tablet   Oral   Take 5 mg by mouth daily.         Marland Kitchen aspirin EC 325 MG tablet   Oral   Take 1 tablet (325 mg total) by mouth daily.   30 tablet   0   . atorvastatin (LIPITOR) 80 MG tablet   Oral   Take 1 tablet (80 mg total) by mouth daily at 6 PM. Patient taking differently: Take 80 mg by mouth every morning.    90 tablet   2   . carvedilol (  COREG) 6.25 MG tablet   Oral   Take 1 tablet (6.25 mg total) by mouth 2 (two) times daily with a meal.   90 tablet   2   . furosemide (LASIX) 20 MG tablet   Oral   Take 1 tablet (20 mg total) by mouth daily.   30 tablet   0   . hydrALAZINE (APRESOLINE) 25 MG tablet   Oral   Take 1 tablet (25 mg total) by mouth 2 (two) times daily.   60 tablet   0   . insulin glargine (LANTUS) 100 UNIT/ML injection   Subcutaneous   Inject 0.1 mLs (10 Units total) into the skin daily. Patient taking differently: Inject 10 Units into the skin at bedtime.    10 mL   11   . insulin starter kit- syringes MISC   Other   1 kit  by Other route once.   1 kit   0   . omeprazole (PRILOSEC) 20 MG capsule   Oral   Take 1 capsule (20 mg total) by mouth daily.   30 capsule   0   . potassium chloride SA (K-DUR,KLOR-CON) 20 MEQ tablet   Oral   Take 1 tablet (20 mEq total) by mouth daily.   30 tablet   0   . sacubitril-valsartan (ENTRESTO) 24-26 MG   Oral   Take 1 tablet by mouth 2 (two) times daily.   180 tablet   3   . Syringe, Disposable, 1 ML MISC   Does not apply   0.1 mLs by Does not apply route as directed.   60 each   11     Allergies Review of patient's allergies indicates no known allergies.  Family History  Problem Relation Age of Onset  . Hypertension Mother   . Congestive Heart Failure Mother   . Hypertension Sister   . Diabetes Sister   . Pancreatitis Sister   . COPD Sister   . Anemia Neg Hx   . Arrhythmia Neg Hx   . Asthma Neg Hx   . Clotting disorder Neg Hx   . Fainting Neg Hx   . Heart attack Neg Hx   . Heart disease Neg Hx   . Heart failure Neg Hx   . Hyperlipidemia Neg Hx   . Pancreatitis Brother     Social History Social History  Substance Use Topics  . Smoking status: Current Every Day Smoker -- 0.50 packs/day for 24 years    Types: Cigarettes  . Smokeless tobacco: Never Used  . Alcohol Use: No    Review of Systems  Constitutional: No fever/chills. Eyes: No visual changes. ENT: No sore throat. Cardiovascular: Positive for chest pain. Respiratory: Denies shortness of breath. Gastrointestinal: No abdominal pain.  No nausea, no vomiting.  No diarrhea.  No constipation. Genitourinary: Negative for dysuria. Musculoskeletal: Negative for back pain. Skin: Negative for rash. Neurological: Negative for headaches, focal weakness or numbness.  10-point ROS otherwise negative.  ____________________________________________   PHYSICAL EXAM:  VITAL SIGNS: ED Triage Vitals  Enc Vitals Group     BP --      Pulse --      Resp --      Temp --      Temp src --       SpO2 --      Weight --      Height --      Head Cir --      Peak Flow --  Pain Score --      Pain Loc --      Pain Edu? --      Excl. in [place]? --     Constitutional: Alert and oriented. Well appearing and in mild acute distress. Eyes: Conjunctivae are normal. PERRL. EOMI. Head: Atraumatic. Nose: No congestion/rhinnorhea. Mouth/Throat: Mucous membranes are moist.  Oropharynx non-erythematous. Neck: No stridor.   Cardiovascular: Normal rate, regular rhythm. Grossly normal heart sounds.  Good peripheral circulation. Respiratory: Normal respiratory effort.  No retractions. Lungs CTAB. Gastrointestinal: Obese. Soft and nontender. No distention. No abdominal bruits. No CVA tenderness. Musculoskeletal: No lower extremity tenderness nor edema.  No joint effusions. Neurologic:  Normal speech and language. No gross focal neurologic deficits are appreciated. No gait instability. Skin:  Skin is warm, dry and intact. No rash noted. Psychiatric: Mood and affect are normal. Speech and behavior are normal.  ____________________________________________   LABS (all labs ordered are listed, but only abnormal results are displayed)  Labs Reviewed  COMPREHENSIVE METABOLIC PANEL - Abnormal; Notable for the following:    Sodium 133 (*)    Potassium 3.4 (*)    Chloride 99 (*)    Glucose, Bld 203 (*)    BUN 27 (*)    Creatinine, Ser 1.39 (*)    Calcium 8.6 (*)    AST 59 (*)    Alkaline Phosphatase 174 (*)    All other components within normal limits  TROPONIN I - Abnormal; Notable for the following:    Troponin I 0.16 (*)    All other components within normal limits  CBC WITH DIFFERENTIAL/PLATELET  ETHANOL  BRAIN NATRIURETIC PEPTIDE  LIPASE, BLOOD  PROTIME-INR  APTT   ____________________________________________  EKG  ED ECG REPORT I, Maleigh Bagot J, the attending physician, personally viewed and interpreted this ECG.   Date: 11/22/2015  EKG Time: 0326  Rate: 98  Rhythm:  normal EKG, normal sinus rhythm  Axis: LAD  Intervals:left bundle branch block  ST&T Change: T-wave depression anterior lateral leads New T-wave inversion in leads V5 and V6 compared with EKG dated 10/02/2015 ____________________________________________  RADIOLOGY  Portable chest x-ray (viewed by me, interpreted per Dr. Gerilyn Nestle): Cardiac enlargement with mild perihilar edema. ____________________________________________   PROCEDURES  Procedure(s) performed: None  Critical Care performed:   CRITICAL CARE Performed by: Paulette Blanch   Total critical care time: 30 minutes  Critical care time was exclusive of separately billable procedures and treating other patients.  Critical care was necessary to treat or prevent imminent or life-threatening deterioration.  Critical care was time spent personally by me on the following activities: development of treatment plan with patient and/or surrogate as well as nursing, discussions with consultants, evaluation of patient's response to treatment, examination of patient, obtaining history from patient or surrogate, ordering and performing treatments and interventions, ordering and review of laboratory studies, ordering and review of radiographic studies, pulse oximetry and re-evaluation of patient's condition.  ____________________________________________   INITIAL IMPRESSION / ASSESSMENT AND PLAN / ED COURSE  Pertinent labs & imaging results that were available during my care of the patient were reviewed by me and considered in my medical decision making (see chart for details).  40 year old male with a history of CAD s/p MI, cardiomyopathy, CHF with AICD who presents with chest pain, tachycardia which resolved after his pacemaker fired. Will administer aspirin, check screening lab work including troponin, BNP, portable chest x-ray. Anticipate hospital admission.  ----------------------------------------- 4:38 AM on  11/22/2015 -----------------------------------------  Updated patient of laboratory and imaging  results. Will initiate heparin bolus with drip. Discussed with hospitalist who will evaluate patient in the emergency department for admission. ____________________________________________   FINAL CLINICAL IMPRESSION(S) / ED DIAGNOSES  Final diagnoses:  Chest pain, unspecified chest pain type  Unstable angina (HCC)  Congestive heart failure, unspecified congestive heart failure chronicity, unspecified congestive heart failure type ([place])  Elevated troponin  Essential hypertension      Paulette Blanch, MD 11/22/15 [phone]

## 2015-11-22 NOTE — ED Notes (Signed)
Report called to 2A 

## 2015-11-22 NOTE — H&P (Signed)
Oscar [name] is an 40 y.o. male.   Chief Complaint: Chest pain HPI: The patient with past medical history of ischemic cardiomyopathy status post AICD placement presents emergency department via EMS after complaining of chest pain. The patient states that his pain began at rest. He called EMS who felt that he may have a tachyarrhythmia. He was given adenosine in the field which slowed his heart rate and prompted firing of his AICD. He states his chest began to hurt more after this. In the emergency department he was found to have ST segment changes to his EKG with vague intermittent chest pain which prompted the emergency department staff to call for admission.  Past Medical History  Diagnosis Date  . Hypertension   . GERD (gastroesophageal reflux disease)   . Sleep apnea     osa  . Hypoxemia   . CHF (congestive heart failure) ([place])   . Failure in dosage     chronic respiratory   . Dysrhythmia     svt  . Coronary artery disease   . Myocardial infarction ([place])     [id number],3/53  . Cardiomyopathy ([place])   . Diabetes mellitus without complication ([place])   . Hyperlipidemia   . Asthma   . Deafness in right ear     Past Surgical History  Procedure Laterality Date  . Left heart catheterization with coronary angiogram N/A 12/09/2014    Procedure: LEFT HEART CATHETERIZATION WITH CORONARY ANGIOGRAM;  Surgeon: Burnell Blanks, MD;  Location: [facility] CATH LAB;  Service: Cardiovascular;  Laterality: N/A;  . Cardiac catheterization  12/11/2014    Procedure: RIGHT/LEFT HEART CATH AND CORONARY ANGIOGRAPHY;  Surgeon: Lorretta Harp, MD;  Location: [facility] CATH LAB;  Service: Cardiovascular;;  . Icd lead removal N/A 03/30/2015    Procedure: ICD LEAD REMOVAL;  Surgeon: Marzetta Board, MD;  Location: [facility] ORS;  Service: Cardiovascular;  Laterality: N/A;  . Implantable cardioverter defibrillator implant    . Insert / replace / remove pacemaker      Family History  Problem Relation Age of Onset  .  Hypertension Mother   . Congestive Heart Failure Mother   . Hypertension Sister   . Diabetes Sister   . Pancreatitis Sister   . COPD Sister   . Anemia Neg Hx   . Arrhythmia Neg Hx   . Asthma Neg Hx   . Clotting disorder Neg Hx   . Fainting Neg Hx   . Heart attack Neg Hx   . Heart disease Neg Hx   . Heart failure Neg Hx   . Hyperlipidemia Neg Hx   . Pancreatitis Brother    Social History:  reports that he has been smoking Cigarettes.  He has a 12 pack-year smoking history. He has never used smokeless tobacco. He reports that he does not drink alcohol or use illicit drugs.  Allergies: No Known Allergies  Medications Prior to Admission  Medication Sig Dispense Refill  . aspirin EC 325 MG tablet Take 1 tablet (325 mg total) by mouth daily. 30 tablet 0  . atorvastatin (LIPITOR) 80 MG tablet Take 1 tablet (80 mg total) by mouth daily at 6 PM. (Patient taking differently: Take 80 mg by mouth every morning. ) 90 tablet 2  . amLODipine (NORVASC) 5 MG tablet Take 5 mg by mouth daily.    . carvedilol (COREG) 6.25 MG tablet Take 1 tablet (6.25 mg total) by mouth 2 (two) times daily with a meal. 90 tablet 2  . furosemide (LASIX)  20 MG tablet Take 1 tablet (20 mg total) by mouth daily. 30 tablet 0  . hydrALAZINE (APRESOLINE) 25 MG tablet Take 1 tablet (25 mg total) by mouth 2 (two) times daily. 60 tablet 0  . insulin glargine (LANTUS) 100 UNIT/ML injection Inject 0.1 mLs (10 Units total) into the skin daily. (Patient taking differently: Inject 10 Units into the skin at bedtime. ) 10 mL 11  . insulin starter kit- syringes MISC 1 kit by Other route once. 1 kit 0  . omeprazole (PRILOSEC) 20 MG capsule Take 1 capsule (20 mg total) by mouth daily. 30 capsule 0  . potassium chloride SA (K-DUR,KLOR-CON) 20 MEQ tablet Take 1 tablet (20 mEq total) by mouth daily. 30 tablet 0  . sacubitril-valsartan (ENTRESTO) 24-26 MG Take 1 tablet by mouth 2 (two) times daily. 180 tablet 3  . Syringe, Disposable, 1 ML  MISC 0.1 mLs by Does not apply route as directed. 60 each 11    Results for orders placed or performed during the hospital encounter of 11/22/15 (from the past 48 hour(s))  CBC with Differential     Status: None   Collection Time: 11/22/15  3:39 AM  Result Value Ref Range   WBC 5.0 3.8 - 10.6 K/uL   RBC 5.04 4.40 - 5.90 MIL/uL   Hemoglobin 16.5 13.0 - 18.0 g/dL   HCT 47.4 40.0 - 52.0 %   MCV 93.9 80.0 - 100.0 fL   MCH 32.6 26.0 - 34.0 pg   MCHC 34.8 32.0 - 36.0 g/dL   RDW 13.3 11.5 - 14.5 %   Platelets 155 150 - 440 K/uL   Neutrophils Relative % 52 %   Neutro Abs 2.6 1.4 - 6.5 K/uL   Lymphocytes Relative 37 %   Lymphs Abs 1.8 1.0 - 3.6 K/uL   Monocytes Relative 6 %   Monocytes Absolute 0.3 0.2 - 1.0 K/uL   Eosinophils Relative 4 %   Eosinophils Absolute 0.2 0 - 0.7 K/uL   Basophils Relative 1 %   Basophils Absolute 0.0 0 - 0.1 K/uL  Comprehensive metabolic panel     Status: Abnormal   Collection Time: 11/22/15  3:39 AM  Result Value Ref Range   Sodium 133 (L) 135 - 145 mmol/L   Potassium 3.4 (L) 3.5 - 5.1 mmol/L   Chloride 99 (L) 101 - 111 mmol/L   CO2 25 22 - 32 mmol/L   Glucose, Bld 203 (H) 65 - 99 mg/dL   BUN 27 (H) 6 - 20 mg/dL   Creatinine, Ser 1.39 (H) 0.61 - 1.24 mg/dL   Calcium 8.6 (L) 8.9 - 10.3 mg/dL   Total Protein 7.8 6.5 - 8.1 g/dL   Albumin 3.6 3.5 - 5.0 g/dL   AST 59 (H) 15 - 41 U/L   ALT 54 17 - 63 U/L   Alkaline Phosphatase 174 (H) 38 - 126 U/L   Total Bilirubin 0.8 0.3 - 1.2 mg/dL   GFR calc non Af Amer >60 >60 mL/min   GFR calc Af Amer >60 >60 mL/min    Comment: (NOTE) The eGFR has been calculated using the CKD EPI equation. This calculation has not been validated in all clinical situations. eGFR's persistently <60 mL/min signify possible Chronic Kidney Disease.    Anion gap 9 5 - 15  Ethanol     Status: None   Collection Time: 11/22/15  3:39 AM  Result Value Ref Range   Alcohol, Ethyl (B) <5 <5 mg/dL    Comment:  LOWEST DETECTABLE  LIMIT FOR SERUM ALCOHOL IS 5 mg/dL FOR MEDICAL PURPOSES ONLY   Troponin I     Status: Abnormal   Collection Time: 11/22/15  3:39 AM  Result Value Ref Range   Troponin I 0.16 (H) <0.031 ng/mL    Comment: READ BACK AND VERIFIED WITH LEE FERGUESON AT 4742 11/22/15.PMH        PERSISTENTLY INCREASED TROPONIN VALUES IN THE RANGE OF 0.04-0.49 ng/mL CAN BE SEEN IN:       -UNSTABLE ANGINA       -CONGESTIVE HEART FAILURE       -MYOCARDITIS       -CHEST TRAUMA       -ARRYHTHMIAS       -LATE PRESENTING MYOCARDIAL INFARCTION       -COPD   CLINICAL FOLLOW-UP RECOMMENDED.   Brain natriuretic peptide     Status: None   Collection Time: 11/22/15  3:39 AM  Result Value Ref Range   B Natriuretic Peptide 100.0 0.0 - 100.0 pg/mL  Lipase, blood     Status: None   Collection Time: 11/22/15  3:39 AM  Result Value Ref Range   Lipase 30 11 - 51 U/L  Protime-INR     Status: None   Collection Time: 11/22/15  3:39 AM  Result Value Ref Range   Prothrombin Time 14.7 11.4 - 15.0 seconds   INR 1.13   APTT     Status: None   Collection Time: 11/22/15  3:39 AM  Result Value Ref Range   aPTT 29 24 - 36 seconds   Dg Chest Port 1 View  11/22/2015  CLINICAL DATA:  Sudden onset chest pain and diaphoresis.  Nausea. EXAM: PORTABLE CHEST 1 VIEW COMPARISON:  09/01/2015 FINDINGS: Cardiac pacemaker. Cardiac enlargement with hazy perihilar opacities suggesting edema. No blunting of costophrenic angles. No pneumothorax. No focal consolidation. IMPRESSION: Cardiac enlargement with mild perihilar edema. Electronically Signed   By: Lucienne Capers M.D.   On: 11/22/2015 03:50    Review of Systems  Constitutional: Negative for fever and chills.  HENT: Negative for sore throat and tinnitus.   Eyes: Negative for blurred vision and redness.  Respiratory: Positive for shortness of breath. Negative for cough.   Cardiovascular: Positive for chest pain. Negative for palpitations, orthopnea and PND.  Gastrointestinal: Negative  for nausea, vomiting, abdominal pain and diarrhea.  Genitourinary: Negative for dysuria, urgency and frequency.  Musculoskeletal: Negative for myalgias and joint pain.  Skin: Negative for rash.       No lesions  Neurological: Negative for speech change, focal weakness and weakness.  Endo/Heme/Allergies: Does not bruise/bleed easily.       No temperature intolerance  Psychiatric/Behavioral: Negative for depression and suicidal ideas.    Blood pressure 125/88, pulse 89, resp. rate 20, height 5' 6"  (1.676 m), weight 148.372 kg (327 lb 1.6 oz), SpO2 99 %. Physical Exam  Constitutional: He is oriented to person, place, and time. He appears well-developed and well-nourished. No distress.  HENT:  Head: Normocephalic and atraumatic.  Mouth/Throat: Oropharynx is clear and moist.  Eyes: Conjunctivae and EOM are normal. Pupils are equal, round, and reactive to light. No scleral icterus.  Neck: Normal range of motion. Neck supple. No JVD present. No tracheal deviation present. No thyromegaly present.  Cardiovascular: Normal rate, regular rhythm and normal heart sounds.  Exam reveals no gallop and no friction rub.   No murmur heard. Respiratory: Effort normal and breath sounds normal.  GI: Soft. Bowel sounds are normal. He  exhibits no distension. There is no tenderness.  Genitourinary:  Deferred  Musculoskeletal: Normal range of motion. He exhibits no edema.  Lymphadenopathy:    He has no cervical adenopathy.  Neurological: He is alert and oriented to person, place, and time. No cranial nerve deficit.  Skin: Skin is warm and dry. No rash noted. No erythema.  Psychiatric: He has a normal mood and affect. His behavior is normal. Judgment and thought content normal.     Assessment/Plan This is a 40 year old male admitted for NSTEMI. 1. NSTEMI: Patient is chest pain-free at the time of my interview. Initial troponin elevated at 0.16; heparin drip started. Continue to follow cardiac enzymes.  Cardiology consulted. Monitor telemetry. 2. Essential hypertension: Continue amlodipine, hydralazine and Ernesto 3. Chronic systolic heart failure: Continue Lasix and carvedilol 4. Diabetes mellitus type 2: Tinea basal insulin; add sliding scale while patient is hospitalized. 5. Obesity: BMI is 52.6; encouraged healthy diet and exercise 6. Obstructive sleep apnea: Initiate CPAP oh patient is hospitalized; he has not yet received his machine at home. 7. DVT prophylaxis: Full dose anticoagulation as above 8. GI prophylaxis: None The patient is a full code. Time spent on admission orders and patient care approximately 45 minutes  Harrie Foreman, MD 11/22/2015, 7:24 AM

## 2015-11-22 NOTE — Care Management (Signed)
Patient is active and current with [facility] and Medication management. No additional needs identified. Case closed.

## 2015-11-22 NOTE — Progress Notes (Signed)
  Oscar [name] is a 40 y.o. male  [id number]  Primary Cardiologist:   Reason for Consultation: Chest pain  HPI: 40-year-old, admitted to the hospital after his AICD fired and he developed some chest pain. He was given adenosine in the field. Chest pain was sharp.   Review of Systems: No orthopnea or PND   Past Medical History  Diagnosis Date  . Hypertension   . GERD (gastroesophageal reflux disease)   . Sleep apnea     osa  . Hypoxemia   . CHF (congestive heart failure) (HCC)   . Failure in dosage     chronic respiratory   . Dysrhythmia     svt  . Coronary artery disease   . Myocardial infarction (HCC)     2012,2014,2/16  . Cardiomyopathy (HCC)   . Diabetes mellitus without complication (HCC)   . Hyperlipidemia   . Asthma   . Deafness in right ear     Medications Prior to Admission  Medication Sig Dispense Refill  . aspirin EC 325 MG tablet Take 1 tablet (325 mg total) by mouth daily. 30 tablet 0  . atorvastatin (LIPITOR) 80 MG tablet Take 1 tablet (80 mg total) by mouth daily at 6 PM. (Patient taking differently: Take 80 mg by mouth every morning. ) 90 tablet 2  . amLODipine (NORVASC) 5 MG tablet Take 5 mg by mouth daily.    . carvedilol (COREG) 6.25 MG tablet Take 1 tablet (6.25 mg total) by mouth 2 (two) times daily with a meal. 90 tablet 2  . furosemide (LASIX) 20 MG tablet Take 1 tablet (20 mg total) by mouth daily. 30 tablet 0  . hydrALAZINE (APRESOLINE) 25 MG tablet Take 1 tablet (25 mg total) by mouth 2 (two) times daily. 60 tablet 0  . insulin glargine (LANTUS) 100 UNIT/ML injection Inject 0.1 mLs (10 Units total) into the skin daily. (Patient taking differently: Inject 10 Units into the skin at bedtime. ) 10 mL 11  . insulin starter kit- syringes MISC 1 kit by Other route once. 1 kit 0  . omeprazole (PRILOSEC) 20 MG capsule Take 1 capsule (20 mg total) by mouth daily. 30 capsule 0  . potassium chloride SA (K-DUR,KLOR-CON) 20 MEQ tablet Take 1  tablet (20 mEq total) by mouth daily. 30 tablet 0  . sacubitril-valsartan (ENTRESTO) 24-26 MG Take 1 tablet by mouth 2 (two) times daily. 180 tablet 3  . Syringe, Disposable, 1 ML MISC 0.1 mLs by Does not apply route as directed. 60 each 11     . amLODipine  5 mg Oral Daily  . aspirin EC  325 mg Oral Daily  . atorvastatin  80 mg Oral [id number]  . carvedilol  6.25 mg Oral BID WC  . docusate sodium  100 mg Oral BID  . furosemide  20 mg Oral Daily  . hydrALAZINE  25 mg Oral BID  . insulin aspart  0-15 Units Subcutaneous TID WC  . insulin glargine  6 Units Subcutaneous QHS  . magnesium sulfate 1 - 4 g bolus IVPB  2 g Intravenous Once  . pantoprazole  40 mg Oral Daily  . potassium chloride  40 mEq Oral Once  . [START ON 11/23/2015] potassium chloride SA  40 mEq Oral Daily  . sacubitril-valsartan  1 tablet Oral BID  . sodium chloride flush  3 mL Intravenous Q12H    Infusions: . heparin 1,300 Units/hr (11/22/15 0530)    No Known Allergies  Social History     Social History  . Marital Status: Single    Spouse Name: N/A  . Number of Children: N/A  . Years of Education: N/A   Occupational History  . Not on file.   Social History Main Topics  . Smoking status: Current Every Day Smoker -- 0.50 packs/day for 24 years    Types: Cigarettes  . Smokeless tobacco: Never Used  . Alcohol Use: No  . Drug Use: No  . Sexual Activity: Yes   Other Topics Concern  . Not on file   Social History Narrative    Family History  Problem Relation Age of Onset  . Hypertension Mother   . Congestive Heart Failure Mother   . Hypertension Sister   . Diabetes Sister   . Pancreatitis Sister   . COPD Sister   . Anemia Neg Hx   . Arrhythmia Neg Hx   . Asthma Neg Hx   . Clotting disorder Neg Hx   . Fainting Neg Hx   . Heart attack Neg Hx   . Heart disease Neg Hx   . Heart failure Neg Hx   . Hyperlipidemia Neg Hx   . Pancreatitis Brother     PHYSICAL EXAM: Filed Vitals:   11/22/15 0639  11/22/15 0833  BP: 125/88 132/90  Pulse: 89 69  Temp:  97.4 F (36.3 C)  Resp: 20 18     Intake/Output Summary (Last 24 hours) at 11/22/15 0908 Last data filed at 11/22/15 0745  Gross per 24 hour  Intake      0 ml  Output    600 ml  Net   -600 ml    General:  Well appearing. No respiratory difficulty HEENT: normal Neck: supple. no JVD. Carotids 2+ bilat; no bruits. No lymphadenopathy or thryomegaly appreciated. Cor: PMI nondisplaced. Regular rate & rhythm. No rubs, gallops or murmurs. Lungs: clear Abdomen: soft, nontender, nondistended. No hepatosplenomegaly. No bruits or masses. Good bowel sounds. Extremities: no cyanosis, clubbing, rash, edema Neuro: alert & oriented x 3, cranial nerves grossly intact. moves all 4 extremities w/o difficulty. Affect pleasant.  ECG:Sinus rhythm 98 bpm with left bundle branch block  Results for orders placed or performed during the hospital encounter of 11/22/15 (from the past 24 hour(s))  CBC with Differential     Status: None   Collection Time: 11/22/15  3:39 AM  Result Value Ref Range   WBC 5.0 3.8 - 10.6 K/uL   RBC 5.04 4.40 - 5.90 MIL/uL   Hemoglobin 16.5 13.0 - 18.0 g/dL   HCT 47.4 40.0 - 52.0 %   MCV 93.9 80.0 - 100.0 fL   MCH 32.6 26.0 - 34.0 pg   MCHC 34.8 32.0 - 36.0 g/dL   RDW 13.3 11.5 - 14.5 %   Platelets 155 150 - 440 K/uL   Neutrophils Relative % 52 %   Neutro Abs 2.6 1.4 - 6.5 K/uL   Lymphocytes Relative 37 %   Lymphs Abs 1.8 1.0 - 3.6 K/uL   Monocytes Relative 6 %   Monocytes Absolute 0.3 0.2 - 1.0 K/uL   Eosinophils Relative 4 %   Eosinophils Absolute 0.2 0 - 0.7 K/uL   Basophils Relative 1 %   Basophils Absolute 0.0 0 - 0.1 K/uL  Comprehensive metabolic panel     Status: Abnormal   Collection Time: 11/22/15  3:39 AM  Result Value Ref Range   Sodium 133 (L) 135 - 145 mmol/L   Potassium 3.4 (L) 3.5 - 5.1 mmol/L   Chloride 99 (  L) 101 - 111 mmol/L   CO2 25 22 - 32 mmol/L   Glucose, Bld 203 (H) 65 - 99 mg/dL    BUN 27 (H) 6 - 20 mg/dL   Creatinine, Ser 1.39 (H) 0.61 - 1.24 mg/dL   Calcium 8.6 (L) 8.9 - 10.3 mg/dL   Total Protein 7.8 6.5 - 8.1 g/dL   Albumin 3.6 3.5 - 5.0 g/dL   AST 59 (H) 15 - 41 U/L   ALT 54 17 - 63 U/L   Alkaline Phosphatase 174 (H) 38 - 126 U/L   Total Bilirubin 0.8 0.3 - 1.2 mg/dL   GFR calc non Af Amer >60 >60 mL/min   GFR calc Af Amer >60 >60 mL/min   Anion gap 9 5 - 15  Ethanol     Status: None   Collection Time: 11/22/15  3:39 AM  Result Value Ref Range   Alcohol, Ethyl (B) <5 <5 mg/dL  Troponin I     Status: Abnormal   Collection Time: 11/22/15  3:39 AM  Result Value Ref Range   Troponin I 0.16 (H) <0.031 ng/mL  Brain natriuretic peptide     Status: None   Collection Time: 11/22/15  3:39 AM  Result Value Ref Range   B Natriuretic Peptide 100.0 0.0 - 100.0 pg/mL  Lipase, blood     Status: None   Collection Time: 11/22/15  3:39 AM  Result Value Ref Range   Lipase 30 11 - 51 U/L  Protime-INR     Status: None   Collection Time: 11/22/15  3:39 AM  Result Value Ref Range   Prothrombin Time 14.7 11.4 - 15.0 seconds   INR 1.13   APTT     Status: None   Collection Time: 11/22/15  3:39 AM  Result Value Ref Range   aPTT 29 24 - 36 seconds  Glucose, capillary     Status: Abnormal   Collection Time: 11/22/15  8:14 AM  Result Value Ref Range   Glucose-Capillary 137 (H) 65 - 99 mg/dL   Comment 1 Notify RN    Dg Chest Port 1 View  11/22/2015  CLINICAL DATA:  Sudden onset chest pain and diaphoresis.  Nausea. EXAM: PORTABLE CHEST 1 VIEW COMPARISON:  09/01/2015 FINDINGS: Cardiac pacemaker. Cardiac enlargement with hazy perihilar opacities suggesting edema. No blunting of costophrenic angles. No pneumothorax. No focal consolidation. IMPRESSION: Cardiac enlargement with mild perihilar edema. Electronically Signed   By: William  Stevens M.D.   On: 11/22/2015 03:50     ASSESSMENT AND PLAN:Atypical chest pain most likely due to tachyarrhythmia. We will have to get  interrogation of the AICD to find out what arrhythmia the patient was in. Will get echocardiogram to evaluate ejection fraction as history of cardiomyopathy.  , A 

## 2015-11-23 LAB — BASIC METABOLIC PANEL
ANION GAP: 3 — AB (ref 5–15)
BUN: 21 mg/dL — ABNORMAL HIGH (ref 6–20)
CALCIUM: 8 mg/dL — AB (ref 8.9–10.3)
CHLORIDE: 101 mmol/L (ref 101–111)
CO2: 32 mmol/L (ref 22–32)
CREATININE: 0.94 mg/dL (ref 0.61–1.24)
GFR calc non Af Amer: >60 mL/min (ref >60)
GLUCOSE: 187 mg/dL — AB (ref 65–99)
Potassium: 4.3 mmol/L (ref 3.5–5.1)
Sodium: 136 mmol/L (ref 135–145)

## 2015-11-23 LAB — GLUCOSE, CAPILLARY: Glucose-Capillary: 186 mg/dL — ABNORMAL HIGH (ref 65–99)

## 2015-11-23 LAB — MAGNESIUM: Magnesium: 1.7 mg/dL (ref 1.7–2.4)

## 2015-11-23 MED ORDER — INSULIN GLARGINE 100 UNIT/ML ~~LOC~~ SOLN: 6.0000 [IU] | Freq: Every day | SUBCUTANEOUS | Status: DC

## 2015-11-23 MED ORDER — CEPHALEXIN 500 MG PO CAPS: 500.0000 mg | ORAL_CAPSULE | Freq: Three times a day (TID) | ORAL | Status: DC

## 2015-11-23 MED ORDER — OXYCODONE-ACETAMINOPHEN 5-325 MG PO TABS: 1.0000 | ORAL_TABLET | Freq: Four times a day (QID) | ORAL | Status: DC | PRN

## 2015-11-23 NOTE — Progress Notes (Signed)
A& O. Room air. Ambulating in the room and tolerated it well. Prescriptions given to pt. Discharge instructions given to pt. IV and tele removed. Pt has no further concerns at this time.

## 2015-11-23 NOTE — Progress Notes (Signed)
SUBJECTIVE: Doing much better no chest pains   Filed Vitals:   11/22/15 1927 11/23/15 0245 11/23/15 0410 11/23/15 0500  BP: 121/80  129/81   Pulse: 80 68 82   Temp: 97.9 F (36.6 C)  98 F (36.7 C)   TempSrc: Oral     Resp:  20 17   Height:      Weight:    328 lb (148.78 kg)  SpO2: 99% 94% 92%     Intake/Output Summary (Last 24 hours) at 11/23/15 1031 Last data filed at 11/23/15 0820  Gross per 24 hour  Intake 373.25 ml  Output   1400 ml  Net -1026.75 ml    LABS: Basic Metabolic Panel:  Recent Labs  11/22/15 0339 11/23/15 0533  NA 133* 136  K 3.4* 4.3  CL 99* 101  CO2 25 32  GLUCOSE 203* 187*  BUN 27* 21*  CREATININE 1.39* 0.94  CALCIUM 8.6* 8.0*  MG  --  1.7   Liver Function Tests:  Recent Labs  11/22/15 0339  AST 59*  ALT 54  ALKPHOS 174*  BILITOT 0.8  PROT 7.8  ALBUMIN 3.6    Recent Labs  11/22/15 0339  LIPASE 30   CBC:  Recent Labs  11/22/15 0339  WBC 5.0  NEUTROABS 2.6  HGB 16.5  HCT 47.4  MCV 93.9  PLT 155   Cardiac Enzymes:  Recent Labs  11/22/15 0339 11/22/15 0818 11/22/15 1346  TROPONINI 0.16* 0.16* 0.14*   BNP: Invalid input(s): POCBNP D-Dimer: No results for input(s): DDIMER in the last 72 hours. Hemoglobin A1C:  Recent Labs  11/22/15 0818  HGBA1C 7.1*   Fasting Lipid Panel: No results for input(s): CHOL, HDL, LDLCALC, TRIG, CHOLHDL, LDLDIRECT in the last 72 hours. Thyroid Function Tests:  Recent Labs  11/22/15 0818  TSH 1.602   Anemia Panel: No results for input(s): VITAMINB12, FOLATE, FERRITIN, TIBC, IRON, RETICCTPCT in the last 72 hours.   PHYSICAL EXAM General: Well developed, well nourished, in no acute distress HEENT:  Normocephalic and atramatic Neck:  No JVD.  Lungs: Clear bilaterally to auscultation and percussion. Heart: HRRR . Normal S1 and S2 without gallops or murmurs.  Abdomen: Bowel sounds are positive, abdomen soft and non-tender  Msk:  Back normal, normal gait. Normal strength  and tone for age. Extremities: No clubbing, cyanosis or edema.   Neuro: Alert and oriented X 3. Psych:  Good affect, responds appropriately  TELEMETRY: NSR  ASSESSMENT AND PLAN: Atypical chest pains, no AICD discharge by enterogation, can go home with f/u next Tuesday.  Active Problems:   NSTEMI (non-ST elevated myocardial infarction) ([place])    Oscar [name] A, MD, [facility] 11/23/2015 10:31 AM

## 2015-11-23 NOTE — Discharge Instructions (Signed)
Advised to stop drinking soda. Chest Pain Observation It is often hard to give a specific diagnosis for the cause of chest pain. Among other possibilities your symptoms might be caused by inadequate oxygen delivery to your heart (angina). Angina that is not treated or evaluated can lead to a heart attack (myocardial infarction) or death. Blood tests, electrocardiograms, and X-rays may have been done to help determine a possible cause of your chest pain. After evaluation and observation, your health care provider has determined that it is unlikely your pain was caused by an unstable condition that requires hospitalization. However, a full evaluation of your pain may need to be completed, with additional diagnostic testing as directed. It is very important to keep your follow-up appointments. Not keeping your follow-up appointments could result in permanent heart damage, disability, or death. If there is any problem keeping your follow-up appointments, you must call your health care provider. HOME CARE INSTRUCTIONS  Due to the slight chance that your pain could be angina, it is important to follow your health care provider's treatment plan and also maintain a healthy lifestyle:  Maintain or work toward achieving a healthy weight.  Stay physically active and exercise regularly.  Decrease your salt intake.  Eat a balanced, healthy diet. Talk to a dietitian to learn about heart-healthy foods.  Increase your fiber intake by including whole grains, vegetables, fruits, and nuts in your diet.  Avoid situations that cause stress, anger, or depression.  Take medicines as advised by your health care provider. Report any side effects to your health care provider. Do not stop medicines or adjust the dosages on your own.  Quit smoking. Do not use nicotine patches or gum until you check with your health care provider.  Keep your blood pressure, blood sugar, and cholesterol levels within normal  limits.  Limit alcohol intake to no more than 1 drink per day for women who are not pregnant and 2 drinks per day for men.  Do not abuse drugs. SEEK IMMEDIATE MEDICAL CARE IF: You have severe chest pain or pressure which may include symptoms such as:  You feel pain or pressure in your arms, neck, jaw, or back.  You have severe back or abdominal pain, feel sick to your stomach (nauseous), or throw up (vomit).  You are sweating profusely.  You are having a fast or irregular heartbeat.  You feel short of breath while at rest.  You notice increasing shortness of breath during rest, sleep, or with activity.  You have chest pain that does not get better after rest or after taking your usual medicine.  You wake from sleep with chest pain.  You are unable to sleep because you cannot breathe.  You develop a frequent cough or you are coughing up blood.  You feel dizzy, faint, or experience extreme fatigue.  You develop severe weakness, dizziness, fainting, or chills. Any of these symptoms may represent a serious problem that is an emergency. Do not wait to see if the symptoms will go away. Call your local emergency services (911 in the [place] Do not drive yourself to the hospital. MAKE SURE YOU:  Understand these instructions.  Will watch your condition.  Will get help right away if you are not doing well or get worse.   This information is not intended to replace advice given to you by your health care provider. Make sure you discuss any questions you have with your health care provider.   Document Released: 09/20/2010 Document Revised: 08/23/2013 Document  Reviewed: 02/17/2013 Elsevier Interactive Patient Education [employer].

## 2015-11-23 NOTE — Discharge Summary (Signed)
[place] [facility] [place] NAME: Oscar [name]    MR#:  [id number]  DATE OF BIRTH:  01/04/1976  DATE OF ADMISSION:  11/22/2015 ADMITTING PHYSICIAN: Harrie Foreman, MD  DATE OF DISCHARGE: 11/23/2015  9:11 AM  PRIMARY CARE PHYSICIAN: No PCP Per Patient    ADMISSION DIAGNOSIS:  Unstable angina ([place]) [I20.0] Elevated troponin [R79.89] Essential hypertension [I10] Chest pain, unspecified chest pain type [R07.9] Congestive heart failure, unspecified congestive heart failure chronicity, unspecified congestive heart failure type ([place]) [I50.9]  DISCHARGE DIAGNOSIS:  Active Problems:   NSTEMI (non-ST elevated myocardial infarction) ([place])   SECONDARY DIAGNOSIS:   Past Medical History  Diagnosis Date  . Hypertension   . GERD (gastroesophageal reflux disease)   . Sleep apnea     osa  . Hypoxemia   . CHF (congestive heart failure) ([place])   . Failure in dosage     chronic respiratory   . Dysrhythmia     svt  . Coronary artery disease   . Myocardial infarction ([place])     6440,3474,2/59  . Cardiomyopathy ([place])   . Diabetes mellitus without complication ([place])   . Hyperlipidemia   . Asthma   . Deafness in right ear     HOSPITAL COURSE:   1. Chest pain and elevated troponin. Troponins chronically elevated with history of cardiomyopathy and CHF. Patient's chest pain had improved but relieved with pain medication. Troponins remained flat. Seen in consultation by cardiology and no further workup. Follow-up with cardiology as outpatient. 2. Patient states that his defibrillator fired. His defibrillator was interrogated and it did not fire. 3. Chronic systolic congestive heart failure. Continue interest oh, Lasix, Coreg and hydralazine 4. Type 2 diabetes mellitus continue Levemir insulin 5. Obesity and sleep apnea weight loss needed 6. Chronic respiratory failure on 2 L nasal cannula 7. Essential hypertension continue usual  medications 8. Hyperlipidemia unspecified continue atorvastatin 9. Positive urinalysis treated empirically with Keflex. 10. Hypokalemia this was replaced during the hospital course.   DISCHARGE CONDITIONS:   Satisfactory  CONSULTS OBTAINED:  Treatment Team:  Dionisio David, MD  DRUG ALLERGIES:  No Known Allergies  DISCHARGE MEDICATIONS:   Discharge Medication List as of 11/23/2015  9:06 AM    START taking these medications   Details  cephALEXin (KEFLEX) 500 MG capsule Take 1 capsule (500 mg total) by mouth 3 (three) times daily., Starting 11/23/2015, Until Discontinued, Print    oxyCODONE-acetaminophen (PERCOCET/ROXICET) 5-325 MG tablet Take 1 tablet by mouth every 6 (six) hours as needed for moderate pain., Starting 11/23/2015, Until Discontinued, Print      CONTINUE these medications which have CHANGED   Details  insulin glargine (LANTUS) 100 UNIT/ML injection Inject 0.06 mLs (6 Units total) into the skin at bedtime., Starting 11/23/2015, Until Discontinued, No Print      CONTINUE these medications which have NOT CHANGED   Details  aspirin EC 325 MG tablet Take 1 tablet (325 mg total) by mouth daily., Starting 07/10/2015, Until Discontinued, Print    atorvastatin (LIPITOR) 80 MG tablet Take 1 tablet (80 mg total) by mouth daily at 6 PM., Starting 07/10/2015, Until Discontinued, Print    amLODipine (NORVASC) 5 MG tablet Take 5 mg by mouth daily., Starting 10/18/2015, Until Discontinued, Historical Med    carvedilol (COREG) 6.25 MG tablet Take 1 tablet (6.25 mg total) by mouth 2 (two) times daily with a meal., Starting 07/10/2015, Until Discontinued, Print    furosemide (LASIX) 20 MG tablet Take  1 tablet (20 mg total) by mouth daily., Starting 07/10/2015, Until Discontinued, Print    hydrALAZINE (APRESOLINE) 25 MG tablet Take 1 tablet (25 mg total) by mouth 2 (two) times daily., Starting 07/10/2015, Until Discontinued, Print    insulin starter kit- syringes MISC 1 kit by Other  route once., Starting 07/10/2015, Print    omeprazole (PRILOSEC) 20 MG capsule Take 1 capsule (20 mg total) by mouth daily., Starting 07/10/2015, Until Discontinued, Print    potassium chloride SA (K-DUR,KLOR-CON) 20 MEQ tablet Take 1 tablet (20 mEq total) by mouth daily., Starting 07/10/2015, Until Discontinued, Print    sacubitril-valsartan (ENTRESTO) 24-26 MG Take 1 tablet by mouth 2 (two) times daily., Starting 08/21/2015, Until Discontinued, Normal    Syringe, Disposable, 1 ML MISC 0.1 mLs by Does not apply route as directed., Starting 08/21/2015, Until Discontinued, Normal         DISCHARGE INSTRUCTIONS:   Follow-up one week cardiology  If you experience worsening of your admission symptoms, develop shortness of breath, life threatening emergency, suicidal or homicidal thoughts you must seek medical attention immediately by calling 911 or calling your MD immediately  if symptoms less severe.  You Must read complete instructions/literature along with all the possible adverse reactions/side effects for all the Medicines you take and that have been prescribed to you. Take any new Medicines after you have completely understood and accept all the possible adverse reactions/side effects.   Please note  You were cared for by a hospitalist during your hospital stay. If you have any questions about your discharge medications or the care you received while you were in the hospital after you are discharged, you can call the unit and asked to speak with the hospitalist on call if the hospitalist that took care of you is not available. Once you are discharged, your primary care physician will handle any further medical issues. Please note that NO REFILLS for any discharge medications will be authorized once you are discharged, as it is imperative that you return to your primary care physician (or establish a relationship with a primary care physician if you do not have one) for your aftercare needs so  that they can reassess your need for medications and monitor your lab values.    Today   CHIEF COMPLAINT:   Chief Complaint  Patient presents with  . Chest Pain    HISTORY OF PRESENT ILLNESS:  Oscar [name]  is a 40 y.o. male with a known history of CAD and CHF presents with chest pain   VITAL SIGNS:  Blood pressure 129/81, pulse 82, temperature 98 F (36.7 C), temperature source Oral, resp. rate 17, height 5' 6"  (1.676 m), weight 148.78 kg (328 lb), SpO2 92 %.    PHYSICAL EXAMINATION:  GENERAL:  40 y.o.-year-old patient lying in the bed with no acute distress.  EYES: Pupils equal, round, reactive to light and accommodation. No scleral icterus. Extraocular muscles intact.  HEENT: Head atraumatic, normocephalic. Oropharynx and nasopharynx clear.  NECK:  Supple, no jugular venous distention. No thyroid enlargement, no tenderness.  LUNGS: Normal breath sounds bilaterally, no wheezing, rales,rhonchi or crepitation. No use of accessory muscles of respiration.  CARDIOVASCULAR: S1, S2 normal. No murmurs, rubs, or gallops.  ABDOMEN: Soft, non-tender, non-distended. Bowel sounds present. No organomegaly or mass.  EXTREMITIES: 2+ edema, no cyanosis, or clubbing.  NEUROLOGIC: Cranial nerves II through XII are intact. Muscle strength 5/5 in all extremities. Sensation intact. Gait not checked.  PSYCHIATRIC: The patient is alert and oriented  x 3.  SKIN: No obvious rash, lesion, or ulcer.   DATA REVIEW:   CBC  Recent Labs Lab 11/22/15 0339  WBC 5.0  HGB 16.5  HCT 47.4  PLT 155    Chemistries   Recent Labs Lab 11/22/15 0339 11/23/15 0533  NA 133* 136  K 3.4* 4.3  CL 99* 101  CO2 25 32  GLUCOSE 203* 187*  BUN 27* 21*  CREATININE 1.39* 0.94  CALCIUM 8.6* 8.0*  MG  --  1.7  AST 59*  --   ALT 54  --   ALKPHOS 174*  --   BILITOT 0.8  --     Cardiac Enzymes  Recent Labs Lab 11/22/15 1346  TROPONINI 0.14*    Microbiology Results  Results for orders placed or  performed during the hospital encounter of 11/22/15  Urine culture     Status: None (Preliminary result)   Collection Time: 11/22/15  6:48 PM  Result Value Ref Range Status   Specimen Description URINE, CLEAN CATCH  Final   Special Requests Normal  Final   Culture INSIGNIFICANT GROWTH  Final   Report Status PENDING  Incomplete    RADIOLOGY:  Dg Chest Port 1 View  11/22/2015  CLINICAL DATA:  Sudden onset chest pain and diaphoresis.  Nausea. EXAM: PORTABLE CHEST 1 VIEW COMPARISON:  09/01/2015 FINDINGS: Cardiac pacemaker. Cardiac enlargement with hazy perihilar opacities suggesting edema. No blunting of costophrenic angles. No pneumothorax. No focal consolidation. IMPRESSION: Cardiac enlargement with mild perihilar edema. Electronically Signed   By: Lucienne Capers M.D.   On: 11/22/2015 03:50   Management plans discussed with the patient, and he is in agreement.  CODE STATUS:  Code Status History    Date Active Date Inactive Code Status Order ID Comments User Context   11/22/2015  7:53 AM 11/23/2015 12:12 PM Full Code [id number]  Harrie Foreman, MD Inpatient   07/09/2015 12:28 PM 07/10/2015  7:34 PM Full Code [id number]  Fritzi Mandes, MD Inpatient   03/30/2015  4:57 PM 03/31/2015  8:18 PM Full Code [id number]  Marzetta Board, MD Inpatient   12/11/2014  5:20 PM 12/13/2014  4:18 PM Full Code [id number]  Lorretta Harp, MD Inpatient   12/09/2014 11:15 PM 12/11/2014  5:20 PM Full Code [id number]  Jules Husbands, MD Inpatient      TOTAL TIME TAKING CARE OF THIS PATIENT: 35 minutes.    Loletha Grayer M.D on 11/23/2015 at 4:35 PM  Between 7am to 6pm - Pager - [phone]  After 6pm go to [email or web address] - password [email or web address] [facility]  [facility]  Office  [phone]  CC: Primary care physician; No PCP Per Patient

## 2015-11-23 NOTE — Progress Notes (Signed)
Patient rested quietly tonight with no complaints of pain. A&Ox4, VSS, and NSR on tele. Nursing staff will continue to monitor. Earleen Reaper, RN

## 2015-11-24 LAB — URINE CULTURE: Special Requests: NORMAL

## 2015-11-29 ENCOUNTER — Ambulatory Visit: Payer: Self-pay | Admitting: Family

## 2015-12-07 ENCOUNTER — Other Ambulatory Visit: Payer: Self-pay | Admitting: Family

## 2015-12-07 MED ORDER — INSULIN GLARGINE 100 UNIT/ML ~~LOC~~ SOLN: 10.0000 [IU] | Freq: Every day | SUBCUTANEOUS | Status: DC

## 2015-12-11 ENCOUNTER — Telehealth: Payer: Self-pay

## 2015-12-11 NOTE — Telephone Encounter (Signed)
Mr called needs to know dates of his future appointments and wants to see if he has missed any appointments.   [phone]

## 2015-12-11 NOTE — Telephone Encounter (Signed)
Lm to give patient requested info  [phone]

## 2015-12-11 NOTE — Telephone Encounter (Signed)
Called patient to give information about appointments. Needs a call about his dental appointment.

## 2015-12-12 ENCOUNTER — Ambulatory Visit: Payer: Self-pay | Admitting: Family

## 2015-12-19 ENCOUNTER — Institutional Professional Consult (permissible substitution): Payer: MEDICAID | Admitting: Urology

## 2015-12-19 ENCOUNTER — Encounter: Payer: MEDICAID | Admitting: Urology

## 2015-12-25 ENCOUNTER — Ambulatory Visit: Payer: Self-pay | Attending: Family | Admitting: Family

## 2015-12-25 ENCOUNTER — Encounter: Payer: Self-pay | Admitting: Family

## 2015-12-25 VITALS — BP 157/82 | HR 86 | Resp 20 | Ht 67.0 in | Wt 335.0 lb

## 2015-12-25 DIAGNOSIS — I2510 Atherosclerotic heart disease of native coronary artery without angina pectoris: Secondary | ICD-10-CM | POA: Insufficient documentation

## 2015-12-25 DIAGNOSIS — I110 Hypertensive heart disease with heart failure: Secondary | ICD-10-CM | POA: Insufficient documentation

## 2015-12-25 DIAGNOSIS — G4733 Obstructive sleep apnea (adult) (pediatric): Secondary | ICD-10-CM | POA: Insufficient documentation

## 2015-12-25 DIAGNOSIS — Z794 Long term (current) use of insulin: Secondary | ICD-10-CM | POA: Insufficient documentation

## 2015-12-25 DIAGNOSIS — I252 Old myocardial infarction: Secondary | ICD-10-CM | POA: Insufficient documentation

## 2015-12-25 DIAGNOSIS — I10 Essential (primary) hypertension: Secondary | ICD-10-CM

## 2015-12-25 DIAGNOSIS — J45909 Unspecified asthma, uncomplicated: Secondary | ICD-10-CM | POA: Insufficient documentation

## 2015-12-25 DIAGNOSIS — K219 Gastro-esophageal reflux disease without esophagitis: Secondary | ICD-10-CM | POA: Insufficient documentation

## 2015-12-25 DIAGNOSIS — I429 Cardiomyopathy, unspecified: Secondary | ICD-10-CM | POA: Insufficient documentation

## 2015-12-25 DIAGNOSIS — Z720 Tobacco use: Secondary | ICD-10-CM

## 2015-12-25 DIAGNOSIS — Z79899 Other long term (current) drug therapy: Secondary | ICD-10-CM | POA: Insufficient documentation

## 2015-12-25 DIAGNOSIS — I5022 Chronic systolic (congestive) heart failure: Secondary | ICD-10-CM | POA: Insufficient documentation

## 2015-12-25 DIAGNOSIS — H9191 Unspecified hearing loss, right ear: Secondary | ICD-10-CM | POA: Insufficient documentation

## 2015-12-25 DIAGNOSIS — Z7982 Long term (current) use of aspirin: Secondary | ICD-10-CM | POA: Insufficient documentation

## 2015-12-25 DIAGNOSIS — E119 Type 2 diabetes mellitus without complications: Secondary | ICD-10-CM | POA: Insufficient documentation

## 2015-12-25 DIAGNOSIS — R5383 Other fatigue: Secondary | ICD-10-CM | POA: Insufficient documentation

## 2015-12-25 DIAGNOSIS — E785 Hyperlipidemia, unspecified: Secondary | ICD-10-CM | POA: Insufficient documentation

## 2015-12-25 DIAGNOSIS — F17210 Nicotine dependence, cigarettes, uncomplicated: Secondary | ICD-10-CM | POA: Insufficient documentation

## 2015-12-25 NOTE — Patient Instructions (Signed)
Continue weighing daily and call for an overnight weight gain of > 2 pounds or a weekly weight gain of >5 pounds.    Smoking Cessation Quitting smoking is important to your health and has many advantages. However, it is not always easy to quit since nicotine is a very addictive drug. Oftentimes, people try 3 times or more before being able to quit. This document explains the best ways for you to prepare to quit smoking. Quitting takes hard work and a lot of effort, but you can do it. ADVANTAGES OF QUITTING SMOKING  You will live longer, feel better, and live better.  Your body will feel the impact of quitting smoking almost immediately.  Within 20 minutes, blood pressure decreases. Your pulse returns to its normal level.  After 8 hours, carbon monoxide levels in the blood return to normal. Your oxygen level increases.  After 24 hours, the chance of having a heart attack starts to decrease. Your breath, hair, and body stop smelling like smoke.  After 48 hours, damaged nerve endings begin to recover. Your sense of taste and smell improve.  After 72 hours, the body is virtually free of nicotine. Your bronchial tubes relax and breathing becomes easier.  After 2 to 12 weeks, lungs can hold more air. Exercise becomes easier and circulation improves.  The risk of having a heart attack, stroke, cancer, or lung disease is greatly reduced.  After 1 year, the risk of coronary heart disease is cut in half.  After 5 years, the risk of stroke falls to the same as a nonsmoker.  After 10 years, the risk of lung cancer is cut in half and the risk of other cancers decreases significantly.  After 15 years, the risk of coronary heart disease drops, usually to the level of a nonsmoker.  If you are pregnant, quitting smoking will improve your chances of having a healthy baby.  The people you live with, especially any children, will be healthier.  You will have extra money to spend on things other  than cigarettes. QUESTIONS TO THINK ABOUT BEFORE ATTEMPTING TO QUIT You may want to talk about your answers with your health care provider.  Why do you want to quit?  If you tried to quit in the past, what helped and what did not?  What will be the most difficult situations for you after you quit? How will you plan to handle them?  Who can help you through the tough times? Your family? Friends? A health care provider?  What pleasures do you get from smoking? What ways can you still get pleasure if you quit? Here are some questions to ask your health care provider:  How can you help me to be successful at quitting?  What medicine do you think would be best for me and how should I take it?  What should I do if I need more help?  What is smoking withdrawal like? How can I get information on withdrawal? GET READY  Set a quit date.  Change your environment by getting rid of all cigarettes, ashtrays, matches, and lighters in your home, car, or work. Do not let people smoke in your home.  Review your past attempts to quit. Think about what worked and what did not. GET SUPPORT AND ENCOURAGEMENT You have a better chance of being successful if you have help. You can get support in many ways.  Tell your family, friends, and coworkers that you are going to quit and need their support. Ask   them not to smoke around you.  Get individual, group, or telephone counseling and support. Programs are available at local hospitals and health centers. Call your local health department for information about programs in your area.  Spiritual beliefs and practices may help some smokers quit.  Download a "quit meter" on your computer to keep track of quit statistics, such as how long you have gone without smoking, cigarettes not smoked, and money saved.  Get a self-help book about quitting smoking and staying off tobacco. LEARN NEW SKILLS AND BEHAVIORS  Distract yourself from urges to smoke. Talk to  someone, go for a walk, or occupy your time with a task.  Change your normal routine. Take a different route to work. Drink tea instead of coffee. Eat breakfast in a different place.  Reduce your stress. Take a hot bath, exercise, or read a book.  Plan something enjoyable to do every day. Reward yourself for not smoking.  Explore interactive web-based programs that specialize in helping you quit. GET MEDICINE AND USE IT CORRECTLY Medicines can help you stop smoking and decrease the urge to smoke. Combining medicine with the above behavioral methods and support can greatly increase your chances of successfully quitting smoking.  Nicotine replacement therapy helps deliver nicotine to your body without the negative effects and risks of smoking. Nicotine replacement therapy includes nicotine gum, lozenges, inhalers, nasal sprays, and skin patches. Some may be available over-the-counter and others require a prescription.  Antidepressant medicine helps people abstain from smoking, but how this works is unknown. This medicine is available by prescription.  Nicotinic receptor partial agonist medicine simulates the effect of nicotine in your brain. This medicine is available by prescription. Ask your health care provider for advice about which medicines to use and how to use them based on your health history. Your health care provider will tell you what side effects to look out for if you choose to be on a medicine or therapy. Carefully read the information on the package. Do not use any other product containing nicotine while using a nicotine replacement product.  RELAPSE OR DIFFICULT SITUATIONS Most relapses occur within the first 3 months after quitting. Do not be discouraged if you start smoking again. Remember, most people try several times before finally quitting. You may have symptoms of withdrawal because your body is used to nicotine. You may crave cigarettes, be irritable, feel very hungry, cough  often, get headaches, or have difficulty concentrating. The withdrawal symptoms are only temporary. They are strongest when you first quit, but they will go away within 10-14 days. To reduce the chances of relapse, try to:  Avoid drinking alcohol. Drinking lowers your chances of successfully quitting.  Reduce the amount of caffeine you consume. Once you quit smoking, the amount of caffeine in your body increases and can give you symptoms, such as a rapid heartbeat, sweating, and anxiety.  Avoid smokers because they can make you want to smoke.  Do not let weight gain distract you. Many smokers will gain weight when they quit, usually less than 10 pounds. Eat a healthy diet and stay active. You can always lose the weight gained after you quit.  Find ways to improve your mood other than smoking. FOR MORE INFORMATION  www.smokefree.gov  Document Released: 08/12/2001 Document Revised: 01/02/2014 Document Reviewed: 11/27/2011 [employer] Patient Information 2015 [employer]. This information is not intended to replace advice given to you by your health care provider. Make sure you discuss any questions you have with your   health care provider.  

## 2015-12-25 NOTE — Progress Notes (Signed)
Subjective:    Patient ID: Oscar [name], male    DOB: [birth year], 40 y.o.   MRN: [id number]  Congestive Heart Failure Presents for follow-up visit. The disease course has been stable. Associated symptoms include fatigue, orthopnea, palpitations and shortness of breath (at times). Pertinent negatives include no abdominal pain, chest pain, chest pressure or edema. The symptoms have been stable. Past treatments include angiotensin receptor blockers, beta blockers, oxygen and salt and fluid restriction. The treatment provided mild relief. Compliance with prior treatments has been good. His past medical history is significant for arrhythmia, CAD, DM and HTN. He has one 1st degree relative with heart disease.  Hypertension This is a chronic problem. The current episode started more than 1 year ago. The problem is unchanged. Associated symptoms include headaches, neck pain, palpitations and shortness of breath (at times). Pertinent negatives include no chest pain or peripheral edema. There are no associated agents to hypertension. Risk factors for coronary artery disease include diabetes mellitus, dyslipidemia, family history, male gender, obesity and smoking/tobacco exposure. Past treatments include angiotensin blockers, beta blockers, diuretics, lifestyle changes and calcium channel blockers. The current treatment provides mild improvement. Compliance problems include exercise.  Hypertensive end-organ damage includes CAD/MI and heart failure.   Past Medical History  Diagnosis Date  . Hypertension   . GERD (gastroesophageal reflux disease)   . Sleep apnea     osa  . Hypoxemia   . CHF (congestive heart failure) ([place])   . Failure in dosage     chronic respiratory   . Dysrhythmia     svt  . Coronary artery disease   . Myocardial infarction ([place])     [id number],5/63  . Cardiomyopathy ([place])   . Diabetes mellitus without complication ([place])   . Hyperlipidemia   . Asthma   . Deafness in right ear      Past Surgical History  Procedure Laterality Date  . Left heart catheterization with coronary angiogram N/A 12/09/2014    Procedure: LEFT HEART CATHETERIZATION WITH CORONARY ANGIOGRAM;  Surgeon: Burnell Blanks, MD;  Location: [facility] CATH LAB;  Service: Cardiovascular;  Laterality: N/A;  . Cardiac catheterization  12/11/2014    Procedure: RIGHT/LEFT HEART CATH AND CORONARY ANGIOGRAPHY;  Surgeon: Lorretta Harp, MD;  Location[facility] CATH LAB;  Service: Cardiovascular;;  . Icd lead removal N/A 03/30/2015    Procedure: ICD LEAD REMOVAL;  Surgeon: Marzetta Board, MD;  Location: [facility] ORS;  Service: Cardiovascular;  Laterality: N/A;  . Implantable cardioverter defibrillator implant    . Insert / replace / remove pacemaker      Family History  Problem Relation Age of Onset  . Hypertension Mother   . Congestive Heart Failure Mother   . Hypertension Sister   . Diabetes Sister   . Pancreatitis Sister   . COPD Sister   . Anemia Neg Hx   . Arrhythmia Neg Hx   . Asthma Neg Hx   . Clotting disorder Neg Hx   . Fainting Neg Hx   . Heart attack Neg Hx   . Heart disease Neg Hx   . Heart failure Neg Hx   . Hyperlipidemia Neg Hx   . Pancreatitis Brother     Social History  Substance Use Topics  . Smoking status: Current Every Day Smoker -- 0.50 packs/day for 24 years    Types: Cigarettes  . Smokeless tobacco: Never Used  . Alcohol Use: No    No Known Allergies  Prior to Admission medications  Medication Sig Start Date End Date Taking? Authorizing Provider  amLODipine (NORVASC) 5 MG tablet Take 5 mg by mouth daily. 10/18/15  Yes Historical Provider, MD  aspirin EC 325 MG tablet Take 1 tablet (325 mg total) by mouth daily. 07/10/15  Yes Aldean Jewett, MD  atorvastatin (LIPITOR) 80 MG tablet Take 80 mg by mouth daily.   Yes Historical Provider, MD  carvedilol (COREG) 6.25 MG tablet Take 1 tablet (6.25 mg total) by mouth 2 (two) times daily with a meal. 07/10/15  Yes Aldean Jewett,  MD  furosemide (LASIX) 20 MG tablet Take 1 tablet (20 mg total) by mouth daily. 07/10/15  Yes Aldean Jewett, MD  hydrALAZINE (APRESOLINE) 25 MG tablet Take 1 tablet (25 mg total) by mouth 2 (two) times daily. 07/10/15  Yes Aldean Jewett, MD  insulin glargine (LANTUS) 100 UNIT/ML injection Inject 0.1 mLs (10 Units total) into the skin at bedtime. 12/07/15  Yes Alisa Graff, FNP  insulin starter kit- syringes MISC 1 kit by Other route once. 07/10/15  Yes Aldean Jewett, MD  omeprazole (PRILOSEC) 20 MG capsule Take 1 capsule (20 mg total) by mouth daily. 07/10/15  Yes Aldean Jewett, MD  oxyCODONE-acetaminophen (PERCOCET/ROXICET) 5-325 MG tablet Take 1 tablet by mouth every 6 (six) hours as needed for moderate pain. 11/23/15  Yes Richard Leslye Peer, MD  potassium chloride SA (K-DUR,KLOR-CON) 20 MEQ tablet Take 1 tablet (20 mEq total) by mouth daily. 07/10/15  Yes Aldean Jewett, MD  sacubitril-valsartan (ENTRESTO) 24-26 MG Take 1 tablet by mouth 2 (two) times daily. 08/21/15  Yes Alisa Graff, FNP  Syringe, Disposable, 1 ML MISC 0.1 mLs by Does not apply route as directed. 08/21/15  Yes Alisa Graff, FNP      Review of Systems  Constitutional: Positive for fatigue. Negative for appetite change.  HENT: Positive for dental problem. Negative for congestion, sinus pressure and sore throat.   Eyes: Negative.   Respiratory: Positive for cough and shortness of breath (at times). Negative for chest tightness.   Cardiovascular: Positive for palpitations. Negative for chest pain and leg swelling.  Gastrointestinal: Negative for abdominal pain and abdominal distention.  Endocrine: Negative.   Genitourinary: Negative.   Musculoskeletal: Positive for neck pain. Negative for back pain.  Skin: Negative.   Allergic/Immunologic: Negative.   Neurological: Positive for headaches. Negative for dizziness and light-headedness.  Hematological: Negative for adenopathy. Does not bruise/bleed easily.   Psychiatric/Behavioral: Positive for dysphoric mood. Negative for suicidal ideas and sleep disturbance (sleeping on 3 pillows and oxygen at 3L). The patient is not nervous/anxious.        Objective:   Physical Exam  Constitutional: He is oriented to person, place, and time. He appears well-developed and well-nourished.  HENT:  Head: Normocephalic and atraumatic.  Eyes: Conjunctivae are normal. Pupils are equal, round, and reactive to light.  Neck: Normal range of motion. Neck supple.  Cardiovascular: Normal rate and regular rhythm.   Pulmonary/Chest: Effort normal. He has no wheezes. He has no rales.  Abdominal: Soft. He exhibits no distension. There is no tenderness.  Musculoskeletal: He exhibits no edema or tenderness.  Neurological: He is alert and oriented to person, place, and time.  Skin: Skin is warm and dry.  Psychiatric: Thought content normal. His mood appears not anxious. He is slowed.  Nursing note and vitals reviewed.   BP 157/82 mmHg  Pulse 86  Resp 20  Ht 5' 7"  (1.702 m)  Wt 335 lb (  151.955 kg)  BMI 52.46 kg/m2  SpO2 95%       Assessment & Plan:  1: Chronic heart failure with reduced ejection fraction- Patient presents with fatigue "all the time" along with intermittent shortness of breath. When he gets short of breath, he'll stop to sit down and rest until his breathing improves. He does have oxygen that he wears at 2L upon exertion as well as at home. He says that he's weighing himself every day and says that his weight has been stable. By our scale, he's gained 6 pounds since 11/01/15. Reminded to call for an overnight weight gain of >2 pounds or a weekly weight gain of >5 pounds. He is not adding any salt to his food and tries to eat low sodium foods. Patient is already fatigued so concerned about increasing carvedilol. Could consider titrating up entresto or adding bidil in the future. 2: HTN- Blood pressure slightly elevated today. Continue to monitor. 3:  Obstructive sleep apnea- Patient says that he's had a sleep study done about 6 months ago. He says that he's supposed to be wearing cpap but he can't afford it because he currently doesn't have any insurance. Patient is falling asleep while he's sitting in the chair and he says that he sleeps "all the time". Will try to get the sleep study report as well as call the [facility] to see if they can assist with getting the equipment for him. Patient is agreeable to [place] making that call. 4: Tobacco use- Patient says that he's still smoking about 1/2 ppd of cigarettes and doesn't express a desire to quit. Does not smoke around his oxygen. Complete cessation discussed for 3 minutes with him.  Patient did not bring his medications nor a list. Each medication was verbally reviewed with the patient and he was encouraged to bring the bottles to every visit to confirm accuracy of list.  Return here in 3 months or sooner for any questions/problems before then.

## 2015-12-26 ENCOUNTER — Ambulatory Visit: Payer: MEDICAID | Admitting: Internal Medicine

## 2016-03-10 ENCOUNTER — Emergency Department
Admission: EM | Admit: 2016-03-10 | Discharge: 2016-03-10 | Disposition: A | Discharge status: Home / Self Care | Payer: Medicaid Other | Attending: Emergency Medicine | Admitting: Emergency Medicine

## 2016-03-10 ENCOUNTER — Emergency Department: Payer: Medicaid Other

## 2016-03-10 ENCOUNTER — Encounter: Payer: Self-pay | Admitting: *Deleted

## 2016-03-10 DIAGNOSIS — I252 Old myocardial infarction: Secondary | ICD-10-CM | POA: Insufficient documentation

## 2016-03-10 DIAGNOSIS — J45909 Unspecified asthma, uncomplicated: Secondary | ICD-10-CM | POA: Insufficient documentation

## 2016-03-10 DIAGNOSIS — Z950 Presence of cardiac pacemaker: Secondary | ICD-10-CM | POA: Insufficient documentation

## 2016-03-10 DIAGNOSIS — I2510 Atherosclerotic heart disease of native coronary artery without angina pectoris: Secondary | ICD-10-CM | POA: Diagnosis not present

## 2016-03-10 DIAGNOSIS — Z8719 Personal history of other diseases of the digestive system: Secondary | ICD-10-CM | POA: Insufficient documentation

## 2016-03-10 DIAGNOSIS — Z79899 Other long term (current) drug therapy: Secondary | ICD-10-CM | POA: Insufficient documentation

## 2016-03-10 DIAGNOSIS — I110 Hypertensive heart disease with heart failure: Secondary | ICD-10-CM | POA: Diagnosis not present

## 2016-03-10 DIAGNOSIS — Z794 Long term (current) use of insulin: Secondary | ICD-10-CM | POA: Diagnosis not present

## 2016-03-10 DIAGNOSIS — E119 Type 2 diabetes mellitus without complications: Secondary | ICD-10-CM | POA: Diagnosis not present

## 2016-03-10 DIAGNOSIS — Z7982 Long term (current) use of aspirin: Secondary | ICD-10-CM | POA: Insufficient documentation

## 2016-03-10 DIAGNOSIS — E785 Hyperlipidemia, unspecified: Secondary | ICD-10-CM | POA: Diagnosis not present

## 2016-03-10 DIAGNOSIS — F17210 Nicotine dependence, cigarettes, uncomplicated: Secondary | ICD-10-CM | POA: Insufficient documentation

## 2016-03-10 DIAGNOSIS — I5022 Chronic systolic (congestive) heart failure: Secondary | ICD-10-CM | POA: Diagnosis not present

## 2016-03-10 DIAGNOSIS — I509 Heart failure, unspecified: Secondary | ICD-10-CM

## 2016-03-10 DIAGNOSIS — I429 Cardiomyopathy, unspecified: Secondary | ICD-10-CM | POA: Insufficient documentation

## 2016-03-10 DIAGNOSIS — R0602 Shortness of breath: Secondary | ICD-10-CM | POA: Diagnosis present

## 2016-03-10 LAB — COMPREHENSIVE METABOLIC PANEL
ALBUMIN: 3.5 g/dL (ref 3.5–5.0)
ALT: 33 U/L (ref 17–63)
AST: 32 U/L (ref 15–41)
Alkaline Phosphatase: 161 U/L — ABNORMAL HIGH (ref 38–126)
Anion gap: 6 (ref 5–15)
BILIRUBIN TOTAL: 0.5 mg/dL (ref 0.3–1.2)
BUN: 18 mg/dL (ref 6–20)
CO2: 32 mmol/L (ref 22–32)
CREATININE: 0.86 mg/dL (ref 0.61–1.24)
Calcium: 8.7 mg/dL — ABNORMAL LOW (ref 8.9–10.3)
Chloride: 99 mmol/L — ABNORMAL LOW (ref 101–111)
GFR calc Af Amer: >60 mL/min (ref >60)
GLUCOSE: 164 mg/dL — AB (ref 65–99)
POTASSIUM: 4 mmol/L (ref 3.5–5.1)
Sodium: 137 mmol/L (ref 135–145)
TOTAL PROTEIN: 7.7 g/dL (ref 6.5–8.1)

## 2016-03-10 LAB — CBC
HEMATOCRIT: 47.9 % (ref 40.0–52.0)
Hemoglobin: 16.9 g/dL (ref 13.0–18.0)
MCH: 33.8 pg (ref 26.0–34.0)
MCHC: 35.3 g/dL (ref 32.0–36.0)
MCV: 95.5 fL (ref 80.0–100.0)
Platelets: 161 10*3/uL (ref 150–440)
RBC: 5.02 MIL/uL (ref 4.40–5.90)
RDW: 13.1 % (ref 11.5–14.5)
WBC: 6.1 10*3/uL (ref 3.8–10.6)

## 2016-03-10 LAB — BRAIN NATRIURETIC PEPTIDE: B Natriuretic Peptide: 130 pg/mL — ABNORMAL HIGH (ref 0.0–100.0)

## 2016-03-10 LAB — TROPONIN I: TROPONIN I: 0.12 ng/mL — AB (ref <0.03)

## 2016-03-10 MED ORDER — FUROSEMIDE 10 MG/ML IJ SOLN
40.0000 mg | Freq: Once | INTRAMUSCULAR | Status: AC
  Administered 2016-03-10: 40 mg via INTRAVENOUS
  Filled 2016-03-10: qty 4

## 2016-03-10 MED ORDER — ACETAMINOPHEN 500 MG PO TABS
ORAL_TABLET | ORAL | Status: AC
  Filled 2016-03-10: qty 2

## 2016-03-10 MED ORDER — ACETAMINOPHEN 500 MG PO TABS
1000.0000 mg | ORAL_TABLET | Freq: Once | ORAL | Status: AC
  Administered 2016-03-10: 1000 mg via ORAL

## 2016-03-10 NOTE — ED Notes (Signed)
Patient presents to the ED with chest pressure x 2 days.  Patient states he normally uses oxygen at home but has been out for about 1 month due to insurance issues.  Patient reports feeling short of breath and reports increased swelling to his lower legs and difficulty lying down flat.

## 2016-03-10 NOTE — ED Notes (Signed)
Dr. Corky Downs in room to assess patient at this time.

## 2016-03-10 NOTE — ED Notes (Signed)
Patient transported to X-ray 

## 2016-03-10 NOTE — Discharge Instructions (Signed)
Heart Failure  Heart failure means your heart has trouble pumping blood. This makes it hard for your body to work well. Heart failure is usually a long-term (chronic) condition. You must take good care of yourself and follow your doctor's treatment plan.  HOME CARE   Take your heart medicine as told by your doctor.    Do not stop taking medicine unless your doctor tells you to.    Do not skip any dose of medicine.    Refill your medicines before they run out.    Take other medicines only as told by your doctor or pharmacist.   Stay active if told by your doctor. The elderly and people with severe heart failure should talk with a doctor about physical activity.   Eat heart-healthy foods. Choose foods that are without trans fat and are low in saturated fat, cholesterol, and salt (sodium). This includes fresh or frozen fruits and vegetables, fish, lean meats, fat-free or low-fat dairy foods, whole grains, and high-fiber foods. Lentils and dried peas and beans (legumes) are also good choices.   Limit salt if told by your doctor.   Cook in a healthy way. Roast, grill, broil, bake, poach, steam, or stir-fry foods.   Limit fluids as told by your doctor.   Weigh yourself every morning. Do this after you pee (urinate) and before you eat breakfast. Write down your weight to give to your doctor.   Take your blood pressure and write it down if your doctor tells you to.   Ask your doctor how to check your pulse. Check your pulse as told.   Lose weight if told by your doctor.   Stop smoking or chewing tobacco. Do not use gum or patches that help you quit without your doctor's approval.   Schedule and go to doctor visits as told.   Nonpregnant women should have no more than 1 drink a day. Men should have no more than 2 drinks a day. Talk to your doctor about drinking alcohol.   Stop illegal drug use.   Stay current with shots (immunizations).   Manage your health conditions as told by your doctor.   Learn to  manage your stress.   Rest when you are tired.   If it is really hot outside:    Avoid intense activities.    Use air conditioning or fans, or get in a cooler place.    Avoid caffeine and alcohol.    Wear loose-fitting, lightweight, and light-colored clothing.   If it is really cold outside:    Avoid intense activities.    Layer your clothing.    Wear mittens or gloves, a hat, and a scarf when going outside.    Avoid alcohol.   Learn about heart failure and get support as needed.   Get help to maintain or improve your quality of life and your ability to care for yourself as needed.  GET HELP IF:    You gain weight quickly.   You are more short of breath than usual.   You cannot do your normal activities.   You tire easily.   You cough more than normal, especially with activity.   You have any or more puffiness (swelling) in areas such as your hands, feet, ankles, or belly (abdomen).   You cannot sleep because it is hard to breathe.   You feel like your heart is beating fast (palpitations).   You get dizzy or light-headed when you stand up.  GET HELP   RIGHT AWAY IF:    You have trouble breathing.   There is a change in mental status, such as becoming less alert or not being able to focus.   You have chest pain or discomfort.   You faint.  MAKE SURE YOU:    Understand these instructions.   Will watch your condition.   Will get help right away if you are not doing well or get worse.     This information is not intended to replace advice given to you by your health care provider. Make sure you discuss any questions you have with your health care provider.     Document Released: 05/27/2008 Document Revised: 09/08/2014 Document Reviewed: 10/04/2012  [employer] Interactive Patient Education 2016 [employer]

## 2016-03-10 NOTE — ED Provider Notes (Signed)
[facility] Emergency Department Provider Note   ____________________________________________    I have reviewed the triage vital signs and the nursing notes.   HISTORY  Chief Complaint Chest Pain and Leg Swelling     HPI Oscar [name] is a 40 y.o. male who presents with intermittent chest pressure and mild shortness of breath. He also reports some increased edema in his bilateral lower extremity. He notes that he becomes short of breath with exertion but does not develop chest pain with exertion. He denies fevers or chills. No calf pain. No recent travel. He has had this before and thinks he may have some extra fluid. He is not currently have chest pain.    Past Medical History  Diagnosis Date  . Hypertension   . GERD (gastroesophageal reflux disease)   . Sleep apnea     osa  . Hypoxemia   . CHF (congestive heart failure) ([place])   . Failure in dosage     chronic respiratory   . Dysrhythmia     svt  . Coronary artery disease   . Myocardial infarction ([place])     5537,4827,0/78  . Cardiomyopathy ([place])   . Diabetes mellitus without complication ([place])   . Hyperlipidemia   . Asthma   . Deafness in right ear     Patient Active Problem List   Diagnosis Date Noted  . Constipation 11/08/2015  . Abdominal pain 11/02/2015  . Congestive heart failure ([place]) 10/18/2015  . Obstructive sleep apnea 07/31/2015  . Tobacco use 07/31/2015  . Diabetes ([place]) 07/10/2015  . Chronic systolic heart failure ([place]) 03/30/2015  . Cardiac pacemaker 03/30/2015  . Chronic respiratory failure with hypercapnia ([place]) 12/12/2014  . Elevated troponin   . Non-ischemic cardiomyopathy ([place])   . Hypoxemia   . SVT (supraventricular tachycardia) ([place]) 12/09/2014  . NSTEMI (non-ST elevated myocardial infarction) ([place]) 12/09/2014  . Hypertension 12/09/2014    Past Surgical History  Procedure Laterality Date  . Left heart catheterization with coronary angiogram N/A  12/09/2014    Procedure: LEFT HEART CATHETERIZATION WITH CORONARY ANGIOGRAM;  Surgeon: Burnell Blanks, MD;  Location: [facility] CATH LAB;  Service: Cardiovascular;  Laterality: N/A;  . Cardiac catheterization  12/11/2014    Procedure: RIGHT/LEFT HEART CATH AND CORONARY ANGIOGRAPHY;  Surgeon: Lorretta Harp, MD;  Location: [facility] CATH LAB;  Service: Cardiovascular;;  . Icd lead removal N/A 03/30/2015    Procedure: ICD LEAD REMOVAL;  Surgeon: Marzetta Board, MD;  Location: [facility] ORS;  Service: Cardiovascular;  Laterality: N/A;  . Implantable cardioverter defibrillator implant    . Insert / replace / remove pacemaker      Current Outpatient Rx  Name  Route  Sig  Dispense  Refill  . amLODipine (NORVASC) 5 MG tablet   Oral   Take 5 mg by mouth daily.         Marland Kitchen aspirin EC 325 MG tablet   Oral   Take 1 tablet (325 mg total) by mouth daily.   30 tablet   0   . atorvastatin (LIPITOR) 80 MG tablet   Oral   Take 80 mg by mouth daily.         . carvedilol (COREG) 6.25 MG tablet   Oral   Take 1 tablet (6.25 mg total) by mouth 2 (two) times daily with a meal.   90 tablet   2   . furosemide (LASIX) 20 MG tablet   Oral   Take 1 tablet (20  mg total) by mouth daily.   30 tablet   0   . hydrALAZINE (APRESOLINE) 25 MG tablet   Oral   Take 1 tablet (25 mg total) by mouth 2 (two) times daily.   60 tablet   0   . insulin glargine (LANTUS) 100 UNIT/ML injection   Subcutaneous   Inject 0.1 mLs (10 Units total) into the skin at bedtime.   4 vial   3   . omeprazole (PRILOSEC) 20 MG capsule   Oral   Take 1 capsule (20 mg total) by mouth daily.   30 capsule   0   . oxyCODONE-acetaminophen (PERCOCET/ROXICET) 5-325 MG tablet   Oral   Take 1 tablet by mouth every 6 (six) hours as needed for moderate pain.   25 tablet   0   . potassium chloride SA (K-DUR,KLOR-CON) 20 MEQ tablet   Oral   Take 1 tablet (20 mEq total) by mouth daily.   30 tablet   0   . sacubitril-valsartan (ENTRESTO)  24-26 MG   Oral   Take 1 tablet by mouth 2 (two) times daily.   180 tablet   3   . insulin starter kit- syringes MISC   Other   1 kit by Other route once.   1 kit   0   . Syringe, Disposable, 1 ML MISC   Does not apply   0.1 mLs by Does not apply route as directed.   60 each   11     Allergies Review of patient's allergies indicates no known allergies.  Family History  Problem Relation Age of Onset  . Hypertension Mother   . Congestive Heart Failure Mother   . Hypertension Sister   . Diabetes Sister   . Pancreatitis Sister   . COPD Sister   . Anemia Neg Hx   . Arrhythmia Neg Hx   . Asthma Neg Hx   . Clotting disorder Neg Hx   . Fainting Neg Hx   . Heart attack Neg Hx   . Heart disease Neg Hx   . Heart failure Neg Hx   . Hyperlipidemia Neg Hx   . Pancreatitis Brother     Social History Social History  Substance Use Topics  . Smoking status: Current Every Day Smoker -- 0.50 packs/day for 24 years    Types: Cigarettes  . Smokeless tobacco: Never Used  . Alcohol Use: No    Review of Systems  Constitutional: No fever/chills Eyes: No visual changes. ENT: No Throat swelling Cardiovascular: As above Respiratory: As above Gastrointestinal: No abdominal pain.  No nausea, no vomiting.   Genitourinary: Negative for dysuria. Musculoskeletal: Negative for back pain. Skin: Negative for rash. Neurological: Negative for headaches or weakness  10-point ROS otherwise negative.  ____________________________________________   PHYSICAL EXAM:  VITAL SIGNS: ED Triage Vitals  Enc Vitals Group     BP 03/10/16 1041 159/97 mmHg     Pulse Rate 03/10/16 1041 92     Resp 03/10/16 1041 22     Temp 03/10/16 1041 98.3 F (36.8 C)     Temp Source 03/10/16 1041 Oral     SpO2 03/10/16 1041 93 %     Weight 03/10/16 1041 338 lb (153.316 kg)     Height 03/10/16 1041 5' 4"  (1.626 m)     Head Cir --      Peak Flow --      Pain Score 03/10/16 1039 5     Pain Loc --  Pain Edu? --      Excl. in [place]? --     Constitutional: Alert and oriented. No acute distress. Pleasant and interactive Eyes: Conjunctivae are normal.  Head: Atraumatic.Normocephalic Nose: No congestion/rhinnorhea. Mouth/Throat: Mucous membranes are moist.  Oropharynx non-erythematous.No swelling Neck: No stridor. Painless ROM Cardiovascular: Normal rate, regular rhythm. Grossly normal heart sounds.  Good peripheral circulation. Respiratory: Normal respiratory effort.  No retractions. Lungs CTAB. Gastrointestinal: Soft and nontender. No distention.  No CVA tenderness. Genitourinary: deferred Musculoskeletal: No lower extremity tenderness. 1+ edema bilaterally  Warm and well perfused Neurologic:  Normal speech and language. No gross focal neurologic deficits are appreciated.  Skin:  Skin is warm, dry and intact. No rash noted. Psychiatric: Mood and affect are normal. Speech and behavior are normal.  ____________________________________________   LABS (all labs ordered are listed, but only abnormal results are displayed)  Labs Reviewed  BRAIN NATRIURETIC PEPTIDE - Abnormal; Notable for the following:    B Natriuretic Peptide 130.0 (*)    All other components within normal limits  COMPREHENSIVE METABOLIC PANEL - Abnormal; Notable for the following:    Chloride 99 (*)    Glucose, Bld 164 (*)    Calcium 8.7 (*)    Alkaline Phosphatase 161 (*)    All other components within normal limits  TROPONIN I - Abnormal; Notable for the following:    Troponin I 0.12 (*)    All other components within normal limits  CBC   ____________________________________________  EKG  ED ECG REPORT I, Lavonia Drafts, the attending physician, personally viewed and interpreted this ECG.   Date: 03/10/2016  EKG Time: 10:42 AM  Rate: 97  Rhythm: Normal sinus rhythm  Axis: Left axis deviation  Intervals:none  ST&T Change:  Nonspecific  ____________________________________________  RADIOLOGY  Chest x-ray unremarkable ____________________________________________   PROCEDURES  Procedure(s) performed: No    Critical Care performed: No ____________________________________________   INITIAL IMPRESSION / ASSESSMENT AND PLAN / ED COURSE  Pertinent labs & imaging results that were available during my care of the patient were reviewed by me and considered in my medical decision making (see chart for details).  Patient presents with increased shortness of breath with exertion, intermittent chest discomfort over the last several days. Review of records demonstrates this is not unusual for him. He does have chronically elevated troponin. We will check labs, x-ray and reevaluate  Workup is unremarkable except for chronically elevated troponin which is lower than usual. We will give Lasix 40 mg. Discussed admission with the patient he states he would prefer to go home because he does not feel he needs admission. He does agree to return immediately to the emergency department if any change in his symptoms.  He will follow-up with heart failure clinic in one day ____________________________________________   FINAL CLINICAL IMPRESSION(S) / ED DIAGNOSES  Final diagnoses:  Congestive heart failure, unspecified congestive heart failure chronicity, unspecified congestive heart failure type ([place])      NEW MEDICATIONS STARTED DURING THIS VISIT:  New Prescriptions   No medications on file     Note:  This document was prepared using Dragon voice recognition software and may include unintentional dictation errors.    Lavonia Drafts, MD 03/10/16 [id number]

## 2016-03-10 NOTE — ED Notes (Addendum)
States chest pressure for 2 days, lower ext edema with SOB, hx of CHF, pt on lasix, pt wears home o2, arrives on RA

## 2016-03-10 NOTE — ED Notes (Signed)
Patient placed on 2L nasal cannula at this time.

## 2016-03-13 ENCOUNTER — Encounter: Payer: Self-pay | Admitting: Family

## 2016-03-13 ENCOUNTER — Ambulatory Visit: Payer: Medicaid Other | Attending: Family | Admitting: Family

## 2016-03-13 VITALS — BP 149/107 | HR 87 | Resp 20 | Ht 67.0 in | Wt 343.0 lb

## 2016-03-13 DIAGNOSIS — I2510 Atherosclerotic heart disease of native coronary artery without angina pectoris: Secondary | ICD-10-CM | POA: Diagnosis not present

## 2016-03-13 DIAGNOSIS — I252 Old myocardial infarction: Secondary | ICD-10-CM | POA: Diagnosis not present

## 2016-03-13 DIAGNOSIS — Z7982 Long term (current) use of aspirin: Secondary | ICD-10-CM | POA: Insufficient documentation

## 2016-03-13 DIAGNOSIS — J45909 Unspecified asthma, uncomplicated: Secondary | ICD-10-CM | POA: Insufficient documentation

## 2016-03-13 DIAGNOSIS — Z794 Long term (current) use of insulin: Secondary | ICD-10-CM | POA: Diagnosis not present

## 2016-03-13 DIAGNOSIS — Z6841 Body Mass Index (BMI) 40.0 and over, adult: Secondary | ICD-10-CM | POA: Diagnosis not present

## 2016-03-13 DIAGNOSIS — F17210 Nicotine dependence, cigarettes, uncomplicated: Secondary | ICD-10-CM | POA: Insufficient documentation

## 2016-03-13 DIAGNOSIS — E669 Obesity, unspecified: Secondary | ICD-10-CM | POA: Diagnosis not present

## 2016-03-13 DIAGNOSIS — Z79899 Other long term (current) drug therapy: Secondary | ICD-10-CM | POA: Insufficient documentation

## 2016-03-13 DIAGNOSIS — I10 Essential (primary) hypertension: Secondary | ICD-10-CM

## 2016-03-13 DIAGNOSIS — F32A Depression, unspecified: Secondary | ICD-10-CM

## 2016-03-13 DIAGNOSIS — K219 Gastro-esophageal reflux disease without esophagitis: Secondary | ICD-10-CM | POA: Insufficient documentation

## 2016-03-13 DIAGNOSIS — I429 Cardiomyopathy, unspecified: Secondary | ICD-10-CM | POA: Insufficient documentation

## 2016-03-13 DIAGNOSIS — Z720 Tobacco use: Secondary | ICD-10-CM

## 2016-03-13 DIAGNOSIS — G4733 Obstructive sleep apnea (adult) (pediatric): Secondary | ICD-10-CM | POA: Diagnosis not present

## 2016-03-13 DIAGNOSIS — E785 Hyperlipidemia, unspecified: Secondary | ICD-10-CM | POA: Diagnosis not present

## 2016-03-13 DIAGNOSIS — F329 Major depressive disorder, single episode, unspecified: Secondary | ICD-10-CM | POA: Diagnosis not present

## 2016-03-13 DIAGNOSIS — E119 Type 2 diabetes mellitus without complications: Secondary | ICD-10-CM | POA: Insufficient documentation

## 2016-03-13 DIAGNOSIS — I5021 Acute systolic (congestive) heart failure: Secondary | ICD-10-CM | POA: Insufficient documentation

## 2016-03-13 DIAGNOSIS — I110 Hypertensive heart disease with heart failure: Secondary | ICD-10-CM | POA: Diagnosis not present

## 2016-03-13 MED ORDER — FUROSEMIDE 40 MG PO TABS: 40.0000 mg | ORAL_TABLET | Freq: Two times a day (BID) | ORAL | Status: DC

## 2016-03-13 NOTE — Progress Notes (Signed)
Subjective:    Patient ID: Oscar [name], male    DOB: June 01, 1976, 40 y.o.   MRN: [id number]  Congestive Heart Failure Presents for follow-up visit. The disease course has been worsening. Associated symptoms include edema, fatigue, palpitations and shortness of breath. Pertinent negatives include no abdominal pain, chest pain or orthopnea. The symptoms have been worsening. Past treatments include angiotensin receptor blockers, oxygen and salt and fluid restriction. The treatment provided mild relief. Compliance with prior treatments has been good. His past medical history is significant for CAD, DM and HTN. There is no history of CVA. He has one 1st degree relative with heart disease.  Hypertension This is a chronic problem. The current episode started more than 1 year ago. The problem has been waxing and waning since onset. Associated symptoms include palpitations, peripheral edema and shortness of breath. Pertinent negatives include no chest pain, headaches or neck pain. There are no associated agents to hypertension. Risk factors for coronary artery disease include diabetes mellitus, dyslipidemia, family history, male gender, obesity, sedentary lifestyle and smoking/tobacco exposure. Past treatments include angiotensin blockers, diuretics and lifestyle changes. The current treatment provides mild improvement. Compliance problems include exercise and diet.  Hypertensive end-organ damage includes CAD/MI and heart failure. Identifiable causes of hypertension include sleep apnea.   Past Medical History  Diagnosis Date  . Hypertension   . GERD (gastroesophageal reflux disease)   . Sleep apnea     osa  . Hypoxemia   . CHF (congestive heart failure) ([place])   . Failure in dosage     chronic respiratory   . Dysrhythmia     svt  . Coronary artery disease   . Myocardial infarction ([place])     5809,9833,8/25  . Cardiomyopathy ([place])   . Diabetes mellitus without complication ([place])   .  Hyperlipidemia   . Asthma   . Deafness in right ear     Past Surgical History  Procedure Laterality Date  . Left heart catheterization with coronary angiogram N/A 12/09/2014    Procedure: LEFT HEART CATHETERIZATION WITH CORONARY ANGIOGRAM;  Surgeon: Oscar Blanks, MD;  Location: [facility] CATH LAB;  Service: Cardiovascular;  Laterality: N/A;  . Cardiac catheterization  12/11/2014    Procedure: RIGHT/LEFT HEART CATH AND CORONARY ANGIOGRAPHY;  Surgeon: Lorretta Harp, MD;  Location: [facility] CATH LAB;  Service: Cardiovascular;;  . Icd lead removal N/A 03/30/2015    Procedure: ICD LEAD REMOVAL;  Surgeon: Marzetta Board, MD;  Location: [facility] ORS;  Service: Cardiovascular;  Laterality: N/A;  . Implantable cardioverter defibrillator implant    . Insert / replace / remove pacemaker      Family History  Problem Relation Age of Onset  . Hypertension Mother   . Congestive Heart Failure Mother   . Hypertension Sister   . Diabetes Sister   . Pancreatitis Sister   . COPD Sister   . Anemia Neg Hx   . Arrhythmia Neg Hx   . Asthma Neg Hx   . Clotting disorder Neg Hx   . Fainting Neg Hx   . Heart attack Neg Hx   . Heart disease Neg Hx   . Heart failure Neg Hx   . Hyperlipidemia Neg Hx   . Pancreatitis Brother    Social History  Substance Use Topics  . Smoking status: Current Every Day Smoker -- 0.50 packs/day for 24 years    Types: Cigarettes  . Smokeless tobacco: Never Used  . Alcohol Use: No    No Known Allergies  Prior to Admission medications   Medication Sig Start Date End Date Taking? Authorizing Provider  aspirin EC 325 MG tablet Take 1 tablet (325 mg total) by mouth daily. 07/10/15  Yes Aldean Jewett, MD  insulin glargine (LANTUS) 100 UNIT/ML injection Inject 0.1 mLs (10 Units total) into the skin at bedtime. 12/07/15  Yes Alisa Graff, FNP  insulin starter kit- syringes MISC 1 kit by Other route once. 07/10/15  Yes Aldean Jewett, MD  metFORMIN (GLUCOPHAGE) 1000 MG tablet  Take 1,000 mg by mouth 2 (two) times daily with a meal.   Yes Historical Provider, MD  omeprazole (PRILOSEC) 20 MG capsule Take 1 capsule (20 mg total) by mouth daily. 07/10/15  Yes Aldean Jewett, MD  potassium chloride SA (K-DUR,KLOR-CON) 20 MEQ tablet Take 1 tablet (20 mEq total) by mouth daily. 07/10/15  Yes Aldean Jewett, MD  sacubitril-valsartan (ENTRESTO) 24-26 MG Take 1 tablet by mouth 2 (two) times daily. 08/21/15  Yes Alisa Graff, FNP  Syringe, Disposable, 1 ML MISC 0.1 mLs by Does not apply route as directed. 08/21/15  Yes Alisa Graff, FNP  furosemide (LASIX) 40 MG tablet Take 1 tablet (40 mg total) by mouth 2 (two) times daily. 03/13/16   Alisa Graff, FNP      Review of Systems  Constitutional: Positive for fatigue. Negative for appetite change.  HENT: Negative for congestion, postnasal drip and sore throat.   Eyes: Negative.   Respiratory: Positive for cough, chest tightness (when laying down), shortness of breath and wheezing ("at times").   Cardiovascular: Positive for palpitations and leg swelling. Negative for chest pain.  Gastrointestinal: Positive for abdominal distention. Negative for abdominal pain.  Endocrine: Negative.   Genitourinary: Negative.   Musculoskeletal: Positive for arthralgias ("legs hurt"). Negative for neck pain.  Skin: Negative.   Allergic/Immunologic: Negative.   Neurological: Negative for dizziness, light-headedness and headaches.  Hematological: Negative for adenopathy. Does not bruise/bleed easily.  Psychiatric/Behavioral: Positive for sleep disturbance (sleeping on 2 pillows with oxygen at 2L; falling asleep constantly) and dysphoric mood. The patient is not nervous/anxious.        Objective:   Physical Exam  Constitutional: He is oriented to person, place, and time. He appears well-developed and well-nourished.  HENT:  Head: Normocephalic and atraumatic.  Eyes: Conjunctivae are normal. Pupils are equal, round, and reactive to  light.  Neck: Normal range of motion. Neck supple.  Cardiovascular: Normal rate and regular rhythm.   Pulmonary/Chest: Effort normal. He has no wheezes. He has no rales.  Abdominal: Soft. He exhibits distension. There is no tenderness.  Musculoskeletal: He exhibits edema (1+ pitting edema in bilateral lower legs). He exhibits no tenderness.  Neurological: He is alert and oriented to person, place, and time.  Skin: Skin is warm and dry.  Psychiatric: Thought content normal. His mood appears not anxious. He is slowed.  Nursing note and vitals reviewed.  BP 149/107 mmHg  Pulse 87  Resp 20  Ht 5' 7"  (1.702 m)  Wt 343 lb (155.584 kg)  BMI 53.71 kg/m2  SpO2 99%        Assessment & Plan:  1: Acute heart failure with reduced ejection fraction- Patient presents after a recent ER visit with continued edema, fatigue and shortness of breath with little exertion (Class III). Denies having symptoms at rest. He is weighing himself daily and says that he's had a gradual weight gain. By our scale, he's gained 8 pounds since he was last here  on 12/25/15. He is not adding any salt to his food and uses Mrs. Dash for seasoning instead. Does admit to drinking about 1 1/2 gallons of fluid daily and realizes that it's too much fluid. Discussed restricting his fluid intake to 1.5-2L daily. Will increase his furosemide to 48m twice daily and will plan on checking a BMP in one week. He will continue to take his potassium once daily along with entresto twice daily. Would like to add bidil but want to get some of this fluid off first. May need to change his diuretic to torsemide if increasing the furosemide doesn't help. Saw his cardiologist May 2017. 2: HTN- Blood pressure is elevated but he's obviously fluid overloaded. Hopefully blood pressure will come down with the increase in diuretic. 3: Obstructive sleep apnea- He continues to wear his oxygen at 2L around the clock. Has recently gotten his medicaid so will  re-write the orders for his CPAP so that it can get resumed. He says that he's tired "all the time" and he's almost falling asleep while he's sitting in the chair talking to me.  4: Tobacco use- Patient continues to smoke 2-3 cigarettes daily and is working on quitting completely.  5: Depression- Patient admits that he's been feeling depressed just because his health isn't doing well right now. Denies any suicidal thoughts. [facility]. Phone number given to him for a PCP as well as podiatry (per his request).   Medication bottles were reviewed with the patient.  Return here in 1 week or sooner for any problems. Patient also instructed to call if his symptoms worsen before next week.

## 2016-03-13 NOTE — Patient Instructions (Addendum)
Make New Patient appointment with:  [facility] Internal Medicine [phone]  [facility] [phone]  **Increse furosemide to 40mg  twice daily. You can finish your current prescription by taking 2 of the 20mg  tablets twice daily until gone and then begin the new prescription which will be 40mg  tablets as 1 tablet twice daily.    Continue weighing daily and call for an overnight weight gain of > 2 pounds or a weekly weight gain of >5 pounds.  Smoking Cessation Quitting smoking is important to your health and has many advantages. However, it is not always easy to quit since nicotine is a very addictive drug. Oftentimes, people try 3 times or more before being able to quit. This document explains the best ways for you to prepare to quit smoking. Quitting takes hard work and a lot of effort, but you can do it. ADVANTAGES OF QUITTING SMOKING  You will live longer, feel better, and live better.  Your body will feel the impact of quitting smoking almost immediately.  Within 20 minutes, blood pressure decreases. Your pulse returns to its normal level.  After 8 hours, carbon monoxide levels in the blood return to normal. Your oxygen level increases.  After 24 hours, the chance of having a heart attack starts to decrease. Your breath, hair, and body stop smelling like smoke.  After 48 hours, damaged nerve endings begin to recover. Your sense of taste and smell improve.  After 72 hours, the body is virtually free of nicotine. Your bronchial tubes relax and breathing becomes easier.  After 2 to 12 weeks, lungs can hold more air. Exercise becomes easier and circulation improves.  The risk of having a heart attack, stroke, cancer, or lung disease is greatly reduced.  After 1 year, the risk of coronary heart disease is cut in half.  After 5 years, the risk of stroke falls to the same as a nonsmoker.  After 10 years, the risk of lung cancer is cut in half and the risk of  other cancers decreases significantly.  After 15 years, the risk of coronary heart disease drops, usually to the level of a nonsmoker.  If you are pregnant, quitting smoking will improve your chances of having a healthy baby.  The people you live with, especially any children, will be healthier.  You will have extra money to spend on things other than cigarettes. QUESTIONS TO THINK ABOUT BEFORE ATTEMPTING TO QUIT You may want to talk about your answers with your health care provider.  Why do you want to quit?  If you tried to quit in the past, what helped and what did not?  What will be the most difficult situations for you after you quit? How will you plan to handle them?  Who can help you through the tough times? Your family? Friends? A health care provider?  What pleasures do you get from smoking? What ways can you still get pleasure if you quit? Here are some questions to ask your health care provider:  How can you help me to be successful at quitting?  What medicine do you think would be best for me and how should I take it?  What should I do if I need more help?  What is smoking withdrawal like? How can I get information on withdrawal? GET READY  Set a quit date.  Change your environment by getting rid of all cigarettes, ashtrays, matches, and lighters in your home, car, or work. Do not let people smoke in your  home.  Review your past attempts to quit. Think about what worked and what did not. GET SUPPORT AND ENCOURAGEMENT You have a better chance of being successful if you have help. You can get support in many ways.  Tell your family, friends, and coworkers that you are going to quit and need their support. Ask them not to smoke around you.  Get individual, group, or telephone counseling and support. Programs are available at [employer] and health centers. Call your local health department for information about programs in your area.  Spiritual beliefs and  practices may help some smokers quit.  Download a "quit meter" on your computer to keep track of quit statistics, such as how long you have gone without smoking, cigarettes not smoked, and money saved.  Get a self-help book about quitting smoking and staying off tobacco. Hawkeye yourself from urges to smoke. Talk to someone, go for a walk, or occupy your time with a task.  Change your normal routine. Take a different route to work. Drink tea instead of coffee. Eat breakfast in a different place.  Reduce your stress. Take a hot bath, exercise, or read a book.  Plan something enjoyable to do every day. Reward yourself for not smoking.  Explore interactive web-based programs that specialize in helping you quit. GET MEDICINE AND USE IT CORRECTLY Medicines can help you stop smoking and decrease the urge to smoke. Combining medicine with the above behavioral methods and support can greatly increase your chances of successfully quitting smoking.  Nicotine replacement therapy helps deliver nicotine to your body without the negative effects and risks of smoking. Nicotine replacement therapy includes nicotine gum, lozenges, inhalers, nasal sprays, and skin patches. Some may be available over-the-counter and others require a prescription.  Antidepressant medicine helps people abstain from smoking, but how this works is unknown. This medicine is available by prescription.  Nicotinic receptor partial agonist medicine simulates the effect of nicotine in your brain. This medicine is available by prescription. Ask your health care provider for advice about which medicines to use and how to use them based on your health history. Your health care provider will tell you what side effects to look out for if you choose to be on a medicine or therapy. Carefully read the information on the package. Do not use any other product containing nicotine while using a nicotine replacement  product.  RELAPSE OR DIFFICULT SITUATIONS Most relapses occur within the first 3 months after quitting. Do not be discouraged if you start smoking again. Remember, most people try several times before finally quitting. You may have symptoms of withdrawal because your body is used to nicotine. You may crave cigarettes, be irritable, feel very hungry, cough often, get headaches, or have difficulty concentrating. The withdrawal symptoms are only temporary. They are strongest when you first quit, but they will go away within 10-14 days. To reduce the chances of relapse, try to:  Avoid drinking alcohol. Drinking lowers your chances of successfully quitting.  Reduce the amount of caffeine you consume. Once you quit smoking, the amount of caffeine in your body increases and can give you symptoms, such as a rapid heartbeat, sweating, and anxiety.  Avoid smokers because they can make you want to smoke.  Do not let weight gain distract you. Many smokers will gain weight when they quit, usually less than 10 pounds. Eat a healthy diet and stay active. You can always lose the weight gained after you quit.  Find ways to improve your mood other than smoking. FOR MORE INFORMATION  www.smokefree.gov  Document Released: 08/12/2001 Document Revised: 01/02/2014 Document Reviewed: 11/27/2011 [employer] Patient Information 2015 [place], [place]. This information is not intended to replace advice given to you by your health care provider. Make sure you discuss any questions you have with your health care provider.

## 2016-03-18 ENCOUNTER — Ambulatory Visit: Payer: Medicaid Other | Attending: Family | Admitting: Family

## 2016-03-18 ENCOUNTER — Encounter: Payer: Self-pay | Admitting: Family

## 2016-03-18 ENCOUNTER — Telehealth: Payer: Self-pay | Admitting: Family

## 2016-03-18 VITALS — BP 150/111 | HR 92 | Resp 18 | Ht 67.0 in | Wt 337.0 lb

## 2016-03-18 DIAGNOSIS — R5383 Other fatigue: Secondary | ICD-10-CM | POA: Insufficient documentation

## 2016-03-18 DIAGNOSIS — I2510 Atherosclerotic heart disease of native coronary artery without angina pectoris: Secondary | ICD-10-CM | POA: Diagnosis not present

## 2016-03-18 DIAGNOSIS — Z716 Tobacco abuse counseling: Secondary | ICD-10-CM | POA: Insufficient documentation

## 2016-03-18 DIAGNOSIS — Z794 Long term (current) use of insulin: Secondary | ICD-10-CM | POA: Insufficient documentation

## 2016-03-18 DIAGNOSIS — Z836 Family history of other diseases of the respiratory system: Secondary | ICD-10-CM | POA: Diagnosis not present

## 2016-03-18 DIAGNOSIS — J45909 Unspecified asthma, uncomplicated: Secondary | ICD-10-CM | POA: Insufficient documentation

## 2016-03-18 DIAGNOSIS — I429 Cardiomyopathy, unspecified: Secondary | ICD-10-CM | POA: Insufficient documentation

## 2016-03-18 DIAGNOSIS — E119 Type 2 diabetes mellitus without complications: Secondary | ICD-10-CM | POA: Diagnosis not present

## 2016-03-18 DIAGNOSIS — Z8249 Family history of ischemic heart disease and other diseases of the circulatory system: Secondary | ICD-10-CM | POA: Diagnosis not present

## 2016-03-18 DIAGNOSIS — R0602 Shortness of breath: Secondary | ICD-10-CM | POA: Insufficient documentation

## 2016-03-18 DIAGNOSIS — Z720 Tobacco use: Secondary | ICD-10-CM

## 2016-03-18 DIAGNOSIS — K219 Gastro-esophageal reflux disease without esophagitis: Secondary | ICD-10-CM | POA: Insufficient documentation

## 2016-03-18 DIAGNOSIS — H9191 Unspecified hearing loss, right ear: Secondary | ICD-10-CM | POA: Diagnosis not present

## 2016-03-18 DIAGNOSIS — I252 Old myocardial infarction: Secondary | ICD-10-CM | POA: Insufficient documentation

## 2016-03-18 DIAGNOSIS — Z832 Family history of diseases of the blood and blood-forming organs and certain disorders involving the immune mechanism: Secondary | ICD-10-CM | POA: Insufficient documentation

## 2016-03-18 DIAGNOSIS — Z7982 Long term (current) use of aspirin: Secondary | ICD-10-CM | POA: Diagnosis not present

## 2016-03-18 DIAGNOSIS — I5022 Chronic systolic (congestive) heart failure: Secondary | ICD-10-CM

## 2016-03-18 DIAGNOSIS — F17210 Nicotine dependence, cigarettes, uncomplicated: Secondary | ICD-10-CM | POA: Diagnosis not present

## 2016-03-18 DIAGNOSIS — E785 Hyperlipidemia, unspecified: Secondary | ICD-10-CM | POA: Insufficient documentation

## 2016-03-18 DIAGNOSIS — I471 Supraventricular tachycardia: Secondary | ICD-10-CM | POA: Insufficient documentation

## 2016-03-18 DIAGNOSIS — G4733 Obstructive sleep apnea (adult) (pediatric): Secondary | ICD-10-CM

## 2016-03-18 DIAGNOSIS — I110 Hypertensive heart disease with heart failure: Secondary | ICD-10-CM | POA: Diagnosis not present

## 2016-03-18 DIAGNOSIS — Z7984 Long term (current) use of oral hypoglycemic drugs: Secondary | ICD-10-CM | POA: Diagnosis not present

## 2016-03-18 DIAGNOSIS — I10 Essential (primary) hypertension: Secondary | ICD-10-CM

## 2016-03-18 LAB — BASIC METABOLIC PANEL
Anion gap: 5 (ref 5–15)
BUN: 17 mg/dL (ref 6–20)
CHLORIDE: 98 mmol/L — AB (ref 101–111)
CO2: 34 mmol/L — ABNORMAL HIGH (ref 22–32)
CREATININE: 0.87 mg/dL (ref 0.61–1.24)
Calcium: 8.7 mg/dL — ABNORMAL LOW (ref 8.9–10.3)
GFR calc Af Amer: >60 mL/min (ref >60)
GLUCOSE: 170 mg/dL — AB (ref 65–99)
POTASSIUM: 4.1 mmol/L (ref 3.5–5.1)
SODIUM: 137 mmol/L (ref 135–145)

## 2016-03-18 MED ORDER — ISOSORB DINITRATE-HYDRALAZINE 20-37.5 MG PO TABS: 1.0000 | ORAL_TABLET | Freq: Three times a day (TID) | ORAL | Status: DC

## 2016-03-18 NOTE — Progress Notes (Signed)
Subjective:    Patient ID: Oscar [name], male    DOB: [birth year], 40 y.o.   MRN: [id number]  Congestive Heart Failure Presents for follow-up visit. The disease course has been stable. Associated symptoms include edema, fatigue, palpitations and shortness of breath. Pertinent negatives include no abdominal pain, chest pain, chest pressure, orthopnea or paroxysmal nocturnal dyspnea. The symptoms have been improving. Past treatments include angiotensin receptor blockers, oxygen and salt and fluid restriction. The treatment provided mild relief. Compliance with prior treatments has been good. His past medical history is significant for CAD, DM and HTN. There is no history of CVA. He has one 1st degree relative with heart disease.  Hypertension This is a chronic problem. The current episode started more than 1 year ago. The problem is unchanged. The problem is uncontrolled. Associated symptoms include palpitations, peripheral edema and shortness of breath. Pertinent negatives include no chest pain, headaches or neck pain. There are no associated agents to hypertension. Risk factors for coronary artery disease include diabetes mellitus, dyslipidemia, family history, male gender, obesity, sedentary lifestyle and smoking/tobacco exposure. Past treatments include angiotensin blockers, diuretics and lifestyle changes. The current treatment provides mild improvement. Compliance problems include exercise.  Hypertensive end-organ damage includes CAD/MI and heart failure.   Past Medical History  Diagnosis Date  . Hypertension   . GERD (gastroesophageal reflux disease)   . Sleep apnea     osa  . Hypoxemia   . CHF (congestive heart failure) ([place])   . Failure in dosage     chronic respiratory   . Dysrhythmia     svt  . Coronary artery disease   . Myocardial infarction ([place])     1583,0940,7/68  . Cardiomyopathy ([place])   . Diabetes mellitus without complication ([place])   . Hyperlipidemia   . Asthma   .  Deafness in right ear     Past Surgical History  Procedure Laterality Date  . Left heart catheterization with coronary angiogram N/A 12/09/2014    Procedure: LEFT HEART CATHETERIZATION WITH CORONARY ANGIOGRAM;  Surgeon: Burnell Blanks, MD;  Location: [facility] CATH LAB;  Service: Cardiovascular;  Laterality: N/A;  . Cardiac catheterization  12/11/2014    Procedure: RIGHT/LEFT HEART CATH AND CORONARY ANGIOGRAPHY;  Surgeon: Lorretta Harp, MD;  Location: [facility] CATH LAB;  Service: Cardiovascular;;  . Icd lead removal N/A 03/30/2015    Procedure: ICD LEAD REMOVAL;  Surgeon: Marzetta Board, MD;  Location: [facility] ORS;  Service: Cardiovascular;  Laterality: N/A;  . Implantable cardioverter defibrillator implant    . Insert / replace / remove pacemaker      Family History  Problem Relation Age of Onset  . Hypertension Mother   . Congestive Heart Failure Mother   . Hypertension Sister   . Diabetes Sister   . Pancreatitis Sister   . COPD Sister   . Anemia Neg Hx   . Arrhythmia Neg Hx   . Asthma Neg Hx   . Clotting disorder Neg Hx   . Fainting Neg Hx   . Heart attack Neg Hx   . Heart disease Neg Hx   . Heart failure Neg Hx   . Hyperlipidemia Neg Hx   . Pancreatitis Brother     Social History  Substance Use Topics  . Smoking status: Current Every Day Smoker -- 0.50 packs/day for 24 years    Types: Cigarettes  . Smokeless tobacco: Never Used  . Alcohol Use: No   No Known Allergies  Prior to Admission medications  Medication Sig Start Date End Date Taking? Authorizing Provider  aspirin EC 325 MG tablet Take 1 tablet (325 mg total) by mouth daily. 07/10/15  Yes Aldean Jewett, MD  furosemide (LASIX) 40 MG tablet Take 1 tablet (40 mg total) by mouth 2 (two) times daily. 03/13/16  Yes Alisa Graff, FNP  insulin glargine (LANTUS) 100 UNIT/ML injection Inject 0.1 mLs (10 Units total) into the skin at bedtime. 12/07/15  Yes Alisa Graff, FNP  insulin starter kit- syringes MISC 1 kit by  Other route once. 07/10/15  Yes Aldean Jewett, MD  metFORMIN (GLUCOPHAGE) 1000 MG tablet Take 1,000 mg by mouth 2 (two) times daily with a meal.   Yes Historical Provider, MD  omeprazole (PRILOSEC) 20 MG capsule Take 1 capsule (20 mg total) by mouth daily. 07/10/15  Yes Aldean Jewett, MD  potassium chloride SA (K-DUR,KLOR-CON) 20 MEQ tablet Take 1 tablet (20 mEq total) by mouth daily. 07/10/15  Yes Aldean Jewett, MD  sacubitril-valsartan (ENTRESTO) 24-26 MG Take 1 tablet by mouth 2 (two) times daily. 08/21/15  Yes Alisa Graff, FNP  Syringe, Disposable, 1 ML MISC 0.1 mLs by Does not apply route as directed. 08/21/15  Yes Alisa Graff, FNP  isosorbide-hydrALAZINE (BIDIL) 20-37.5 MG tablet Take 1 tablet by mouth 3 (three) times daily. 03/18/16   Alisa Graff, FNP      Review of Systems  Constitutional: Positive for fatigue. Negative for appetite change.  HENT: Negative for congestion, postnasal drip and sore throat.   Eyes: Negative.   Respiratory: Positive for shortness of breath. Negative for cough, chest tightness and wheezing.   Cardiovascular: Positive for palpitations and leg swelling ("better"). Negative for chest pain.  Gastrointestinal: Negative for abdominal pain and abdominal distention.  Endocrine: Negative.   Genitourinary: Negative.   Musculoskeletal: Negative for back pain and neck pain.  Allergic/Immunologic: Negative.   Neurological: Negative for dizziness, light-headedness and headaches.  Hematological: Negative for adenopathy. Does not bruise/bleed easily.  Psychiatric/Behavioral: Positive for sleep disturbance (wearing oxygen at 2L; "sleeping all the time") and dysphoric mood. Negative for suicidal ideas. The patient is not nervous/anxious.        Objective:   Physical Exam  Constitutional: He is oriented to person, place, and time. He appears well-developed and well-nourished.  HENT:  Head: Normocephalic and atraumatic.  Eyes: Conjunctivae are normal.  Pupils are equal, round, and reactive to light.  Neck: Normal range of motion. Neck supple.  Cardiovascular: Regular rhythm.  Tachycardia present.   Pulmonary/Chest: Effort normal. He has no wheezes. He has no rales.  Abdominal: Soft. He exhibits no distension. There is no tenderness.  Musculoskeletal: He exhibits edema (trace amount pedal edema in bilateral lower legs). He exhibits no tenderness.  Neurological: He is alert and oriented to person, place, and time.  Skin: Skin is warm and dry.  Psychiatric: He has a normal mood and affect. His behavior is normal. Thought content normal.  Very sleepy in the office  Nursing note and vitals reviewed.  BP 150/111 mmHg  Pulse 92  Resp 18  Ht 5' 7"  (1.702 m)  Wt 337 lb (152.862 kg)  BMI 52.77 kg/m2  SpO2 93%        Assessment & Plan:  1: Chronic heart failure with reduced ejection fraction- Patient presents with continued fatigue and shortness of breath with minimal exertion (Class III). He says that symptoms quickly improve upon rest. He feels like the swelling in his lower legs are  better from last week after increasing his furosemide to 20m twice daily. Will check a BMP today and call patient with the results. He is weighing himself daily and says that he's lost some weight. By our scale, he's lost 6 pounds in the last 5 days. Reminded to call for an overnight weight gain of >2 pounds or a weekly weight gain of >5 pounds. He is not adding any salt to his food. He still doesn't know how much fluid he is drinking but he says that he's no longer drinking anything after 8pm. Encouraged him to still measure his daily intake so as to keep it under 2L (1/2 gallon) daily. Will add bidil 37.5/20mg three times daily. [facility] went in and reviewed medications with the patient. Plan on adding carvedilol at his next visit as long as he's stable.  2: HTN- Blood pressure remains elevated but adding bidil today. Emphasized the importance of taking the  medication three times daily and patient says that he will. [facility]. 3: Obstructive sleep apnea- New order faxed in to sleep med on 03/14/16 but patient hasn't heard anything yet. If he hasn't heard anything by his next appointment with [facility] will call them to follow-up. Patient is still very sleepy while in the office. Does wear his oxygen at 2L at bedtime and exertion. 4: Tobacco use- Patient is smoking 2 cigarettes daily. Discussed complete cessation with him for 3 minutes.   Patient did not bring his medications nor a list. Each medication was verbally reviewed with the patient and he was encouraged to bring the bottles to every visit to confirm accuracy of list.  Return in 1 week or sooner for any questions/problems before then.

## 2016-03-18 NOTE — Telephone Encounter (Signed)
Advised patient that potassium, sodium and renal function all looked good. Continue furosemide 40mg  twice daily and potassium 41meq once daily. Awaiting CPAP equipment.

## 2016-03-18 NOTE — Patient Instructions (Signed)
Continue weighing daily and call for an overnight weight gain of > 2 pounds or a weekly weight gain of >5 pounds.    Smoking Cessation Quitting smoking is important to your health and has many advantages. However, it is not always easy to quit since nicotine is a very addictive drug. Oftentimes, people try 3 times or more before being able to quit. This document explains the best ways for you to prepare to quit smoking. Quitting takes hard work and a lot of effort, but you can do it. ADVANTAGES OF QUITTING SMOKING  You will live longer, feel better, and live better.  Your body will feel the impact of quitting smoking almost immediately.  Within 20 minutes, blood pressure decreases. Your pulse returns to its normal level.  After 8 hours, carbon monoxide levels in the blood return to normal. Your oxygen level increases.  After 24 hours, the chance of having a heart attack starts to decrease. Your breath, hair, and body stop smelling like smoke.  After 48 hours, damaged nerve endings begin to recover. Your sense of taste and smell improve.  After 72 hours, the body is virtually free of nicotine. Your bronchial tubes relax and breathing becomes easier.  After 2 to 12 weeks, lungs can hold more air. Exercise becomes easier and circulation improves.  The risk of having a heart attack, stroke, cancer, or lung disease is greatly reduced.  After 1 year, the risk of coronary heart disease is cut in half.  After 5 years, the risk of stroke falls to the same as a nonsmoker.  After 10 years, the risk of lung cancer is cut in half and the risk of other cancers decreases significantly.  After 15 years, the risk of coronary heart disease drops, usually to the level of a nonsmoker.  If you are pregnant, quitting smoking will improve your chances of having a healthy baby.  The people you live with, especially any children, will be healthier.  You will have extra money to spend on things other  than cigarettes. QUESTIONS TO THINK ABOUT BEFORE ATTEMPTING TO QUIT You may want to talk about your answers with your health care provider.  Why do you want to quit?  If you tried to quit in the past, what helped and what did not?  What will be the most difficult situations for you after you quit? How will you plan to handle them?  Who can help you through the tough times? Your family? Friends? A health care provider?  What pleasures do you get from smoking? What ways can you still get pleasure if you quit? Here are some questions to ask your health care provider:  How can you help me to be successful at quitting?  What medicine do you think would be best for me and how should I take it?  What should I do if I need more help?  What is smoking withdrawal like? How can I get information on withdrawal? GET READY  Set a quit date.  Change your environment by getting rid of all cigarettes, ashtrays, matches, and lighters in your home, car, or work. Do not let people smoke in your home.  Review your past attempts to quit. Think about what worked and what did not. GET SUPPORT AND ENCOURAGEMENT You have a better chance of being successful if you have help. You can get support in many ways.  Tell your family, friends, and coworkers that you are going to quit and need their support. Ask   them not to smoke around you.  Get individual, group, or telephone counseling and support. Programs are available at local hospitals and health centers. Call your local health department for information about programs in your area.  Spiritual beliefs and practices may help some smokers quit.  Download a "quit meter" on your computer to keep track of quit statistics, such as how long you have gone without smoking, cigarettes not smoked, and money saved.  Get a self-help book about quitting smoking and staying off tobacco. LEARN NEW SKILLS AND BEHAVIORS  Distract yourself from urges to smoke. Talk to  someone, go for a walk, or occupy your time with a task.  Change your normal routine. Take a different route to work. Drink tea instead of coffee. Eat breakfast in a different place.  Reduce your stress. Take a hot bath, exercise, or read a book.  Plan something enjoyable to do every day. Reward yourself for not smoking.  Explore interactive web-based programs that specialize in helping you quit. GET MEDICINE AND USE IT CORRECTLY Medicines can help you stop smoking and decrease the urge to smoke. Combining medicine with the above behavioral methods and support can greatly increase your chances of successfully quitting smoking.  Nicotine replacement therapy helps deliver nicotine to your body without the negative effects and risks of smoking. Nicotine replacement therapy includes nicotine gum, lozenges, inhalers, nasal sprays, and skin patches. Some may be available over-the-counter and others require a prescription.  Antidepressant medicine helps people abstain from smoking, but how this works is unknown. This medicine is available by prescription.  Nicotinic receptor partial agonist medicine simulates the effect of nicotine in your brain. This medicine is available by prescription. Ask your health care provider for advice about which medicines to use and how to use them based on your health history. Your health care provider will tell you what side effects to look out for if you choose to be on a medicine or therapy. Carefully read the information on the package. Do not use any other product containing nicotine while using a nicotine replacement product.  RELAPSE OR DIFFICULT SITUATIONS Most relapses occur within the first 3 months after quitting. Do not be discouraged if you start smoking again. Remember, most people try several times before finally quitting. You may have symptoms of withdrawal because your body is used to nicotine. You may crave cigarettes, be irritable, feel very hungry, cough  often, get headaches, or have difficulty concentrating. The withdrawal symptoms are only temporary. They are strongest when you first quit, but they will go away within 10-14 days. To reduce the chances of relapse, try to:  Avoid drinking alcohol. Drinking lowers your chances of successfully quitting.  Reduce the amount of caffeine you consume. Once you quit smoking, the amount of caffeine in your body increases and can give you symptoms, such as a rapid heartbeat, sweating, and anxiety.  Avoid smokers because they can make you want to smoke.  Do not let weight gain distract you. Many smokers will gain weight when they quit, usually less than 10 pounds. Eat a healthy diet and stay active. You can always lose the weight gained after you quit.  Find ways to improve your mood other than smoking. FOR MORE INFORMATION  www.smokefree.gov  Document Released: 08/12/2001 Document Revised: 01/02/2014 Document Reviewed: 11/27/2011 [employer] Patient Information 2015 [employer]. This information is not intended to replace advice given to you by your health care provider. Make sure you discuss any questions you have with your   health care provider.  

## 2016-03-21 ENCOUNTER — Ambulatory Visit: Payer: Medicaid Other | Admitting: Family

## 2016-03-25 ENCOUNTER — Ambulatory Visit: Payer: Self-pay | Admitting: Family

## 2016-03-28 ENCOUNTER — Telehealth: Payer: Self-pay | Admitting: Family

## 2016-03-28 ENCOUNTER — Ambulatory Visit: Payer: Medicaid Other | Admitting: Family

## 2016-03-28 ENCOUNTER — Encounter: Payer: Self-pay | Admitting: Family

## 2016-03-28 NOTE — Telephone Encounter (Signed)
Patient did not show for his [facility] appointment on 03/28/16. Will attempt to reschedule.

## 2016-04-01 ENCOUNTER — Other Ambulatory Visit: Payer: Self-pay

## 2016-04-01 DIAGNOSIS — K219 Gastro-esophageal reflux disease without esophagitis: Secondary | ICD-10-CM

## 2016-04-01 MED ORDER — OMEPRAZOLE 20 MG PO CPDR: 20.0000 mg | DELAYED_RELEASE_CAPSULE | Freq: Every day | ORAL | 0 refills | Status: DC

## 2016-04-15 ENCOUNTER — Encounter: Payer: Self-pay | Admitting: Family

## 2016-04-15 ENCOUNTER — Ambulatory Visit: Payer: Medicaid Other | Attending: Family | Admitting: Family

## 2016-04-15 VITALS — BP 158/114 | HR 91 | Resp 20 | Ht 67.0 in | Wt 347.0 lb

## 2016-04-15 DIAGNOSIS — R0902 Hypoxemia: Secondary | ICD-10-CM | POA: Insufficient documentation

## 2016-04-15 DIAGNOSIS — H9191 Unspecified hearing loss, right ear: Secondary | ICD-10-CM | POA: Insufficient documentation

## 2016-04-15 DIAGNOSIS — I509 Heart failure, unspecified: Secondary | ICD-10-CM | POA: Insufficient documentation

## 2016-04-15 DIAGNOSIS — Z833 Family history of diabetes mellitus: Secondary | ICD-10-CM | POA: Diagnosis not present

## 2016-04-15 DIAGNOSIS — Z8489 Family history of other specified conditions: Secondary | ICD-10-CM | POA: Diagnosis not present

## 2016-04-15 DIAGNOSIS — Z832 Family history of diseases of the blood and blood-forming organs and certain disorders involving the immune mechanism: Secondary | ICD-10-CM | POA: Insufficient documentation

## 2016-04-15 DIAGNOSIS — E785 Hyperlipidemia, unspecified: Secondary | ICD-10-CM | POA: Diagnosis not present

## 2016-04-15 DIAGNOSIS — I429 Cardiomyopathy, unspecified: Secondary | ICD-10-CM | POA: Insufficient documentation

## 2016-04-15 DIAGNOSIS — J45909 Unspecified asthma, uncomplicated: Secondary | ICD-10-CM | POA: Insufficient documentation

## 2016-04-15 DIAGNOSIS — E119 Type 2 diabetes mellitus without complications: Secondary | ICD-10-CM | POA: Insufficient documentation

## 2016-04-15 DIAGNOSIS — G4730 Sleep apnea, unspecified: Secondary | ICD-10-CM | POA: Diagnosis not present

## 2016-04-15 DIAGNOSIS — Z825 Family history of asthma and other chronic lower respiratory diseases: Secondary | ICD-10-CM | POA: Insufficient documentation

## 2016-04-15 DIAGNOSIS — Z794 Long term (current) use of insulin: Secondary | ICD-10-CM | POA: Diagnosis not present

## 2016-04-15 DIAGNOSIS — I110 Hypertensive heart disease with heart failure: Secondary | ICD-10-CM | POA: Insufficient documentation

## 2016-04-15 DIAGNOSIS — I5023 Acute on chronic systolic (congestive) heart failure: Secondary | ICD-10-CM

## 2016-04-15 DIAGNOSIS — Z79899 Other long term (current) drug therapy: Secondary | ICD-10-CM | POA: Insufficient documentation

## 2016-04-15 DIAGNOSIS — Z95811 Presence of heart assist device: Secondary | ICD-10-CM | POA: Diagnosis not present

## 2016-04-15 DIAGNOSIS — Z9889 Other specified postprocedural states: Secondary | ICD-10-CM | POA: Diagnosis not present

## 2016-04-15 DIAGNOSIS — I2510 Atherosclerotic heart disease of native coronary artery without angina pectoris: Secondary | ICD-10-CM | POA: Diagnosis not present

## 2016-04-15 DIAGNOSIS — K219 Gastro-esophageal reflux disease without esophagitis: Secondary | ICD-10-CM | POA: Insufficient documentation

## 2016-04-15 DIAGNOSIS — Z8249 Family history of ischemic heart disease and other diseases of the circulatory system: Secondary | ICD-10-CM | POA: Insufficient documentation

## 2016-04-15 DIAGNOSIS — Z888 Allergy status to other drugs, medicaments and biological substances status: Secondary | ICD-10-CM | POA: Insufficient documentation

## 2016-04-15 DIAGNOSIS — G4733 Obstructive sleep apnea (adult) (pediatric): Secondary | ICD-10-CM

## 2016-04-15 DIAGNOSIS — F17210 Nicotine dependence, cigarettes, uncomplicated: Secondary | ICD-10-CM | POA: Insufficient documentation

## 2016-04-15 DIAGNOSIS — Z720 Tobacco use: Secondary | ICD-10-CM

## 2016-04-15 DIAGNOSIS — I10 Essential (primary) hypertension: Secondary | ICD-10-CM

## 2016-04-15 DIAGNOSIS — Z7982 Long term (current) use of aspirin: Secondary | ICD-10-CM | POA: Insufficient documentation

## 2016-04-15 MED ORDER — POTASSIUM CHLORIDE CRYS ER 20 MEQ PO TBCR: 20.0000 meq | EXTENDED_RELEASE_TABLET | Freq: Two times a day (BID) | ORAL | 0 refills | Status: DC

## 2016-04-15 MED ORDER — TORSEMIDE 20 MG PO TABS: 60.0000 mg | ORAL_TABLET | Freq: Two times a day (BID) | ORAL | 3 refills | Status: DC

## 2016-04-15 MED ORDER — METOLAZONE 2.5 MG PO TABS: 2.5000 mg | ORAL_TABLET | Freq: Every day | ORAL | 0 refills | Status: DC

## 2016-04-15 NOTE — Progress Notes (Signed)
Subjective:    Patient ID: Oscar [name], male    DOB: 03/08/1976, 40 y.o.   MRN: [id number]  Congestive Heart Failure  Presents for follow-up visit. Associated symptoms include edema, fatigue, orthopnea and shortness of breath. Pertinent negatives include no abdominal pain, chest pain, chest pressure, palpitations or paroxysmal nocturnal dyspnea. The symptoms have been worsening.  Hypertension  This is a chronic problem. The current episode started more than 1 year ago. The problem has been waxing and waning since onset. The problem is uncontrolled. Associated symptoms include peripheral edema and shortness of breath. Pertinent negatives include no chest pain, headaches, neck pain or palpitations. There are no associated agents to hypertension. Risk factors for coronary artery disease include family history, obesity, male gender, smoking/tobacco exposure and dyslipidemia. Past treatments include angiotensin blockers, calcium channel blockers, diuretics and lifestyle changes. Compliance problems include exercise.  Hypertensive end-organ damage includes CAD/MI and heart failure.   Past Medical History:  Diagnosis Date  . Asthma   . Cardiomyopathy ([place])   . CHF (congestive heart failure) ([place])   . Coronary artery disease   . Deafness in right ear   . Diabetes mellitus without complication ([place])   . Dysrhythmia    svt  . 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 GERD (gastroesophageal reflux disease)   . Hyperlipidemia   . Hypertension   . Hypoxemia   . Myocardial infarction ([place])    [id number],2/20  . Sleep apnea    osa    Past Surgical History:  Procedure Laterality Date  . CARDIAC CATHETERIZATION  12/11/2014   Procedure: RIGHT/LEFT HEART CATH AND CORONARY ANGIOGRAPHY;  Surgeon: Lorretta Harp, MD;  Location: [facility] CATH LAB;  Service: Cardiovascular;;  . ICD LEAD REMOVAL N/A 03/30/2015   Procedure: ICD LEAD REMOVAL;  Surgeon: Marzetta Board, MD;  Location: [facility] ORS;  Service:  Cardiovascular;  Laterality: N/A;  . IMPLANTABLE CARDIOVERTER DEFIBRILLATOR IMPLANT    . INSERT / REPLACE / REMOVE PACEMAKER    . LEFT HEART CATHETERIZATION WITH CORONARY ANGIOGRAM N/A 12/09/2014   Procedure: LEFT HEART CATHETERIZATION WITH CORONARY ANGIOGRAM;  Surgeon: Burnell Blanks, MD;  Location: [facility] CATH LAB;  Service: Cardiovascular;  Laterality: N/A;    Family History  Problem Relation Age of Onset  . Hypertension Mother   . Congestive Heart Failure Mother   . Hypertension Sister   . Diabetes Sister   . Pancreatitis Sister   . COPD Sister   . Anemia Neg Hx   . Arrhythmia Neg Hx   . Asthma Neg Hx   . Clotting disorder Neg Hx   . Fainting Neg Hx   . Heart attack Neg Hx   . Heart disease Neg Hx   . Heart failure Neg Hx   . Hyperlipidemia Neg Hx   . Pancreatitis Brother    Social History  Substance Use Topics  . Smoking status: Current Every Day Smoker    Packs/day: 0.50    Years: 24.00    Types: Cigarettes  . Smokeless tobacco: Never Used  . Alcohol use No     Allergies  Allergen Reactions  . Bidil [Isosorb Dinitrate-Hydralazine] Other (See Comments)    Migraine Headache    Prior to Admission medications   Medication Sig Start Date End Date Taking? Authorizing Provider  aspirin EC 325 MG tablet Take 1 tablet (325 mg total) by mouth daily. 07/10/15  Yes Aldean Jewett, MD  insulin glargine (LANTUS) 100 UNIT/ML injection Inject 0.1  mLs (10 Units total) into the skin at bedtime. 12/07/15  Yes Alisa Graff, FNP  insulin starter kit- syringes MISC 1 kit by Other route once. 07/10/15  Yes Aldean Jewett, MD  metFORMIN (GLUCOPHAGE) 1000 MG tablet Take 1,000 mg by mouth 2 (two) times daily with a meal.   Yes Historical Provider, MD  omeprazole (PRILOSEC) 20 MG capsule Take 1 capsule (20 mg total) by mouth daily. 04/01/16  Yes Shannon A McGowan, PA-C  potassium chloride SA (K-DUR,KLOR-CON) 20 MEQ tablet Take 1 tablet (20 mEq total) by mouth 2 (two) times daily.  04/15/16  Yes Alisa Graff, FNP  sacubitril-valsartan (ENTRESTO) 24-26 MG Take 1 tablet by mouth 2 (two) times daily. 08/21/15  Yes Alisa Graff, FNP  Syringe, Disposable, 1 ML MISC 0.1 mLs by Does not apply route as directed. 08/21/15  Yes Alisa Graff, FNP  metolazone (ZAROXOLYN) 2.5 MG tablet Take 1 tablet (2.5 mg total) by mouth daily. 04/15/16 04/19/16  Alisa Graff, FNP  torsemide (DEMADEX) 20 MG tablet Take 3 tablets (60 mg total) by mouth 2 (two) times daily. 04/15/16   Alisa Graff, FNP      Review of Systems  Constitutional: Positive for fatigue. Negative for appetite change.  HENT: Negative for congestion, rhinorrhea and sore throat.   Eyes: Positive for visual disturbance (blurry vision at times). Negative for pain.  Respiratory: Positive for cough (productive cough of yellow sputum), shortness of breath and wheezing. Negative for chest tightness.   Cardiovascular: Positive for leg swelling. Negative for chest pain and palpitations.  Gastrointestinal: Negative for abdominal distention and abdominal pain.  Endocrine: Negative.   Genitourinary: Negative.   Musculoskeletal: Negative for back pain and neck pain.  Skin: Negative.   Allergic/Immunologic: Negative.   Neurological: Negative for dizziness, light-headedness and headaches.  Hematological: Negative for adenopathy. Does not bruise/bleed easily.  Psychiatric/Behavioral: Positive for dysphoric mood. Negative for sleep disturbance (sleeping on 4 pillows) and suicidal ideas. The patient is not nervous/anxious.        Objective:   Physical Exam  Constitutional: He is oriented to person, place, and time. He appears well-developed and well-nourished.  HENT:  Head: Normocephalic and atraumatic.  Eyes: Conjunctivae are normal. Pupils are equal, round, and reactive to light.  Neck: Normal range of motion. Neck supple.  Cardiovascular: Regular rhythm.  Tachycardia present.   Pulmonary/Chest: Effort normal. He has no  wheezes. He has no rales.  Abdominal: Soft. He exhibits no distension. There is no tenderness.  Musculoskeletal: He exhibits edema (2+ pitting edema in bilateral lower legs). He exhibits no tenderness.  Neurological: He is alert and oriented to person, place, and time.  Skin: Skin is warm and dry.  Psychiatric: He has a normal mood and affect. His behavior is normal. Thought content normal.  Nursing note and vitals reviewed.   BP (!) 158/114 (BP Location: Left Arm, Patient Position: Sitting)   Pulse 91   Resp 20   Ht 5' 7"  (1.702 m)   Wt (!) 347 lb (157.4 kg)   SpO2 98% Comment: on 2L  BMI 54.35 kg/m        Assessment & Plan:  1: Acute on chronic heart failure with reduced ejection fraction- Patient presents with fatigue and shortness of breath with little exertion (Class III). Symptoms do improve upon rest. He has continued edema in bilateral lower legs. He continues to weigh himself daily and has noticed a gradual weight gain. By our scale, he's gained 10  pounds since he was last here on 03/18/16. Will change his diuretic to torsemide 62m twice daily and add metolazone 2.520mdaily for 4 days along with increasing his potassium to 2028mtwice daily. Written instructions provided and [facility] and [facility] medications with the patient. Patient verbalizes understanding of medication changes. Added bidil at last visit but he says that he had to stop it because it gave him migraines. Added bidil to allergies today. Would like to add a beta-blocker but need to wait until he's not having a HF exacerbation. Saw his cardiologist on 01/16/16 and returns to him November 2017.  2: HTN- Blood pressure elevated but patient says that he hasn't taken any of his medications yet today. He will take them when he returns home and adjusting diuretic per above. 3: Obstructive sleep apnea- Sleep study was scheduled for 05/06/16 but patient called sleep med saying he needed to reschedule it but hadn't heard  anything. Sleep Med called and spoke with someone regarding this. Updated phone number given to sleep med and she said that she would call him to reschedule. Discussed the importance of getting this done as quickly as possible so that his CPAP can be resumed. Continues to wear oxygen at 2L around the clock.  4: Diabetes- Glucose this morning was 201. [facility] 5: Tobacco use- Patient continues to smoke and doesn't express a desire to quit at this time. Complete cessation was discussed for 3 minutes with him.   Patient did not bring his medications nor a list. Each medication was verbally reviewed with the patient and he was encouraged to bring the bottles to every visit to confirm accuracy of list.  Patient to call the office in 3 days to update on weight, edema and symptoms. He will return in 1 week and will check a basic metabolic panel at that time.

## 2016-04-15 NOTE — Patient Instructions (Addendum)
STOP Furosemide (lasix)  Begin Torsemide (Demadex) 60mg  (3 tablets) twice daily.  Take Metolazone 2.5mg  once daily for 4 days. Take it 30 minutes before the torsemide.  Increase potassium to twice daily as well.     Continue weighing daily and call for an overnight weight gain of > 2 pounds or a weekly weight gain of >5 pounds.    Smoking Cessation Quitting smoking is important to your health and has many advantages. However, it is not always easy to quit since nicotine is a very addictive drug. Oftentimes, people try 3 times or more before being able to quit. This document explains the best ways for you to prepare to quit smoking. Quitting takes hard work and a lot of effort, but you can do it. ADVANTAGES OF QUITTING SMOKING  You will live longer, feel better, and live better.  Your body will feel the impact of quitting smoking almost immediately.  Within 20 minutes, blood pressure decreases. Your pulse returns to its normal level.  After 8 hours, carbon monoxide levels in the blood return to normal. Your oxygen level increases.  After 24 hours, the chance of having a heart attack starts to decrease. Your breath, hair, and body stop smelling like smoke.  After 48 hours, damaged nerve endings begin to recover. Your sense of taste and smell improve.  After 72 hours, the body is virtually free of nicotine. Your bronchial tubes relax and breathing becomes easier.  After 2 to 12 weeks, lungs can hold more air. Exercise becomes easier and circulation improves.  The risk of having a heart attack, stroke, cancer, or lung disease is greatly reduced.  After 1 year, the risk of coronary heart disease is cut in half.  After 5 years, the risk of stroke falls to the same as a nonsmoker.  After 10 years, the risk of lung cancer is cut in half and the risk of other cancers decreases significantly.  After 15 years, the risk of coronary heart disease drops, usually to the level of a  nonsmoker.  If you are pregnant, quitting smoking will improve your chances of having a healthy baby.  The people you live with, especially any children, will be healthier.  You will have extra money to spend on things other than cigarettes. QUESTIONS TO THINK ABOUT BEFORE ATTEMPTING TO QUIT You may want to talk about your answers with your health care provider.  Why do you want to quit?  If you tried to quit in the past, what helped and what did not?  What will be the most difficult situations for you after you quit? How will you plan to handle them?  Who can help you through the tough times? Your family? Friends? A health care provider?  What pleasures do you get from smoking? What ways can you still get pleasure if you quit? Here are some questions to ask your health care provider:  How can you help me to be successful at quitting?  What medicine do you think would be best for me and how should I take it?  What should I do if I need more help?  What is smoking withdrawal like? How can I get information on withdrawal? GET READY  Set a quit date.  Change your environment by getting rid of all cigarettes, ashtrays, matches, and lighters in your home, car, or work. Do not let people smoke in your home.  Review your past attempts to quit. Think about what worked and what did not. GET  SUPPORT AND ENCOURAGEMENT You have a better chance of being successful if you have help. You can get support in many ways.  Tell your family, friends, and coworkers that you are going to quit and need their support. Ask them not to smoke around you.  Get individual, group, or telephone counseling and support. Programs are available at [employer] and health centers. Call your local health department for information about programs in your area.  Spiritual beliefs and practices may help some smokers quit.  Download a "quit meter" on your computer to keep track of quit statistics, such as how  long you have gone without smoking, cigarettes not smoked, and money saved.  Get a self-help book about quitting smoking and staying off tobacco. [place] yourself from urges to smoke. Talk to someone, go for a walk, or occupy your time with a task.  Change your normal routine. Take a different route to work. Drink tea instead of coffee. Eat breakfast in a different place.  Reduce your stress. Take a hot bath, exercise, or read a book.  Plan something enjoyable to do every day. Reward yourself for not smoking.  Explore interactive web-based programs that specialize in helping you quit. GET MEDICINE AND USE IT CORRECTLY Medicines can help you stop smoking and decrease the urge to smoke. Combining medicine with the above behavioral methods and support can greatly increase your chances of successfully quitting smoking.  Nicotine replacement therapy helps deliver nicotine to your body without the negative effects and risks of smoking. Nicotine replacement therapy includes nicotine gum, lozenges, inhalers, nasal sprays, and skin patches. Some may be available over-the-counter and others require a prescription.  Antidepressant medicine helps people abstain from smoking, but how this works is unknown. This medicine is available by prescription.  Nicotinic receptor partial agonist medicine simulates the effect of nicotine in your brain. This medicine is available by prescription. Ask your health care provider for advice about which medicines to use and how to use them based on your health history. Your health care provider will tell you what side effects to look out for if you choose to be on a medicine or therapy. Carefully read the information on the package. Do not use any other product containing nicotine while using a nicotine replacement product.  RELAPSE OR DIFFICULT SITUATIONS Most relapses occur within the first 3 months after quitting. Do not be discouraged  if you start smoking again. Remember, most people try several times before finally quitting. You may have symptoms of withdrawal because your body is used to nicotine. You may crave cigarettes, be irritable, feel very hungry, cough often, get headaches, or have difficulty concentrating. The withdrawal symptoms are only temporary. They are strongest when you first quit, but they will go away within 10-14 days. To reduce the chances of relapse, try to:  Avoid drinking alcohol. Drinking lowers your chances of successfully quitting.  Reduce the amount of caffeine you consume. Once you quit smoking, the amount of caffeine in your body increases and can give you symptoms, such as a rapid heartbeat, sweating, and anxiety.  Avoid smokers because they can make you want to smoke.  Do not let weight gain distract you. Many smokers will gain weight when they quit, usually less than 10 pounds. Eat a healthy diet and stay active. You can always lose the weight gained after you quit.  Find ways to improve your mood other than smoking. FOR MORE INFORMATION  www.smokefree.gov  Document  Released: 08/12/2001 Document Revised: 01/02/2014 Document Reviewed: 11/27/2011 [facility] Patient Information 2015 [place], [place]. This information is not intended to replace advice given to you by your health care provider. Make sure you discuss any questions you have with your health care provider.

## 2016-04-16 ENCOUNTER — Telehealth: Payer: Self-pay | Admitting: Family

## 2016-04-16 NOTE — Telephone Encounter (Signed)
Patient called to say that he began the 60mg  torsemide yesterday along with 2.5mg  metolazone. He also took extra potassium. This morning he woke up 3 pounds lighter, less swelling but with "terrible" leg/foot cramps so he only took 20mg  torsemide this morning and 1.25mg  (1/2 tablet) of metolazone this morning. Patient says that his feet look "much better" so advised him to just take 20mg  torsemide this afternoon. If his weight goes down more overnight, he can hold the metolazone. He is to call me back tomorrow if further problems or in 2 days to update. Patient is comfortable with this plan.

## 2016-04-24 ENCOUNTER — Ambulatory Visit: Payer: Medicaid Other | Admitting: Family

## 2016-05-06 ENCOUNTER — Ambulatory Visit: Payer: Medicaid Other | Attending: Otolaryngology

## 2016-05-06 DIAGNOSIS — J449 Chronic obstructive pulmonary disease, unspecified: Secondary | ICD-10-CM | POA: Diagnosis not present

## 2016-05-06 DIAGNOSIS — I509 Heart failure, unspecified: Secondary | ICD-10-CM | POA: Insufficient documentation

## 2016-05-06 DIAGNOSIS — G4733 Obstructive sleep apnea (adult) (pediatric): Secondary | ICD-10-CM | POA: Insufficient documentation

## 2016-05-06 DIAGNOSIS — G4730 Sleep apnea, unspecified: Secondary | ICD-10-CM | POA: Diagnosis present

## 2016-05-09 ENCOUNTER — Ambulatory Visit: Payer: Medicaid Other | Admitting: Family

## 2016-05-20 ENCOUNTER — Encounter: Payer: Self-pay | Admitting: Family

## 2016-05-20 ENCOUNTER — Ambulatory Visit: Payer: Medicaid Other | Attending: Family | Admitting: Family

## 2016-05-20 VITALS — BP 135/88 | HR 89 | Resp 20 | Ht 67.0 in | Wt 341.0 lb

## 2016-05-20 DIAGNOSIS — I499 Cardiac arrhythmia, unspecified: Secondary | ICD-10-CM | POA: Insufficient documentation

## 2016-05-20 DIAGNOSIS — Z833 Family history of diabetes mellitus: Secondary | ICD-10-CM | POA: Diagnosis not present

## 2016-05-20 DIAGNOSIS — Z794 Long term (current) use of insulin: Secondary | ICD-10-CM | POA: Diagnosis not present

## 2016-05-20 DIAGNOSIS — K219 Gastro-esophageal reflux disease without esophagitis: Secondary | ICD-10-CM | POA: Insufficient documentation

## 2016-05-20 DIAGNOSIS — Z7982 Long term (current) use of aspirin: Secondary | ICD-10-CM | POA: Insufficient documentation

## 2016-05-20 DIAGNOSIS — Z716 Tobacco abuse counseling: Secondary | ICD-10-CM | POA: Diagnosis not present

## 2016-05-20 DIAGNOSIS — H918X1 Other specified hearing loss, right ear: Secondary | ICD-10-CM | POA: Insufficient documentation

## 2016-05-20 DIAGNOSIS — Z8249 Family history of ischemic heart disease and other diseases of the circulatory system: Secondary | ICD-10-CM | POA: Diagnosis not present

## 2016-05-20 DIAGNOSIS — I10 Essential (primary) hypertension: Secondary | ICD-10-CM | POA: Insufficient documentation

## 2016-05-20 DIAGNOSIS — I5022 Chronic systolic (congestive) heart failure: Secondary | ICD-10-CM | POA: Diagnosis present

## 2016-05-20 DIAGNOSIS — J40 Bronchitis, not specified as acute or chronic: Secondary | ICD-10-CM

## 2016-05-20 DIAGNOSIS — Z836 Family history of other diseases of the respiratory system: Secondary | ICD-10-CM | POA: Insufficient documentation

## 2016-05-20 DIAGNOSIS — I252 Old myocardial infarction: Secondary | ICD-10-CM | POA: Diagnosis not present

## 2016-05-20 DIAGNOSIS — Z955 Presence of coronary angioplasty implant and graft: Secondary | ICD-10-CM | POA: Diagnosis not present

## 2016-05-20 DIAGNOSIS — Z720 Tobacco use: Secondary | ICD-10-CM

## 2016-05-20 DIAGNOSIS — E119 Type 2 diabetes mellitus without complications: Secondary | ICD-10-CM | POA: Insufficient documentation

## 2016-05-20 DIAGNOSIS — J9612 Chronic respiratory failure with hypercapnia: Secondary | ICD-10-CM

## 2016-05-20 DIAGNOSIS — G4733 Obstructive sleep apnea (adult) (pediatric): Secondary | ICD-10-CM | POA: Diagnosis not present

## 2016-05-20 DIAGNOSIS — I2510 Atherosclerotic heart disease of native coronary artery without angina pectoris: Secondary | ICD-10-CM | POA: Diagnosis not present

## 2016-05-20 DIAGNOSIS — E785 Hyperlipidemia, unspecified: Secondary | ICD-10-CM | POA: Insufficient documentation

## 2016-05-20 DIAGNOSIS — F17210 Nicotine dependence, cigarettes, uncomplicated: Secondary | ICD-10-CM | POA: Diagnosis not present

## 2016-05-20 MED ORDER — DM-GUAIFENESIN ER 30-600 MG PO TB12: 1.0000 | ORAL_TABLET | Freq: Two times a day (BID) | ORAL | 3 refills | Status: DC | PRN

## 2016-05-20 MED ORDER — AZITHROMYCIN 250 MG PO TABS: 250.0000 mg | ORAL_TABLET | Freq: Every day | ORAL | 0 refills | Status: DC

## 2016-05-20 NOTE — Progress Notes (Signed)
Subjective:    Patient ID: Oscar [name], male    DOB: 1976-06-04, 40 y.o.   MRN: [id number]  Congestive Heart Failure  Presents for follow-up visit. The disease course has been stable. Associated symptoms include edema, fatigue, orthopnea, palpitations and shortness of breath. Pertinent negatives include no abdominal pain, chest pain or paroxysmal nocturnal dyspnea. The symptoms have been stable. Past treatments include angiotensin receptor blockers, oxygen and salt and fluid restriction. The treatment provided moderate relief. Compliance with prior treatments has been good. His past medical history is significant for CAD, chronic lung disease, DM and HTN.  Cough  This is a new problem. The current episode started in the past 7 days. The problem has been gradually worsening. The problem occurs every few hours. The cough is productive of brown sputum. Associated symptoms include shortness of breath and wheezing. Pertinent negatives include no chest pain, ear congestion, fever, nasal congestion, postnasal drip or sore throat. Nothing aggravates the symptoms. Risk factors for lung disease include smoking/tobacco exposure. He has tried nothing for the symptoms. His past medical history is significant for COPD.   Past Medical History:  Diagnosis Date  . Asthma   . Cardiomyopathy ([place])   . CHF (congestive heart failure) ([place])   . Coronary artery disease   . Deafness in right ear   . Diabetes mellitus without complication ([place])   . Dysrhythmia    svt  . 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 GERD (gastroesophageal reflux disease)   . Hyperlipidemia   . Hypertension   . Hypoxemia   . Myocardial infarction ([place])    3016,0109,3/23  . Sleep apnea    osa    Past Surgical History:  Procedure Laterality Date  . CARDIAC CATHETERIZATION  12/11/2014   Procedure: RIGHT/LEFT HEART CATH AND CORONARY ANGIOGRAPHY;  Surgeon: Lorretta Harp, MD;  Location: [facility] CATH LAB;  Service:  Cardiovascular;;  . ICD LEAD REMOVAL N/A 03/30/2015   Procedure: ICD LEAD REMOVAL;  Surgeon: Marzetta Board, MD;  Location: [facility] ORS;  Service: Cardiovascular;  Laterality: N/A;  . IMPLANTABLE CARDIOVERTER DEFIBRILLATOR IMPLANT    . INSERT / REPLACE / REMOVE PACEMAKER    . LEFT HEART CATHETERIZATION WITH CORONARY ANGIOGRAM N/A 12/09/2014   Procedure: LEFT HEART CATHETERIZATION WITH CORONARY ANGIOGRAM;  Surgeon: Burnell Blanks, MD;  Location: [facility] CATH LAB;  Service: Cardiovascular;  Laterality: N/A;    Family History  Problem Relation Age of Onset  . Hypertension Mother   . Congestive Heart Failure Mother   . Hypertension Sister   . Diabetes Sister   . Pancreatitis Sister   . COPD Sister   . Anemia Neg Hx   . Arrhythmia Neg Hx   . Asthma Neg Hx   . Clotting disorder Neg Hx   . Fainting Neg Hx   . Heart attack Neg Hx   . Heart disease Neg Hx   . Heart failure Neg Hx   . Hyperlipidemia Neg Hx   . Pancreatitis Brother     Social History  Substance Use Topics  . Smoking status: Current Every Day Smoker    Packs/day: 0.50    Years: 24.00    Types: Cigarettes  . Smokeless tobacco: Never Used  . Alcohol use No    Allergies  Allergen Reactions  . Bidil [Isosorb Dinitrate-Hydralazine] Other (See Comments)    Migraine Headache    Prior to Admission medications   Medication Sig Start Date End Date Taking? Authorizing Provider  aspirin EC 325 MG tablet Take 1 tablet (325 mg total) by mouth daily. 07/10/15  Yes Aldean Jewett, MD  insulin glargine (LANTUS) 100 UNIT/ML injection Inject 0.1 mLs (10 Units total) into the skin at bedtime. Patient taking differently: Inject 15 Units into the skin at bedtime.  12/07/15  Yes Alisa Graff, FNP  insulin starter kit- syringes MISC 1 kit by Other route once. 07/10/15  Yes Aldean Jewett, MD  metFORMIN (GLUCOPHAGE) 1000 MG tablet Take 1,000 mg by mouth 2 (two) times daily with a meal.   Yes Historical Provider, MD  omeprazole  (PRILOSEC) 20 MG capsule Take 1 capsule (20 mg total) by mouth daily. 04/01/16  Yes Shannon A McGowan, PA-C  potassium chloride SA (K-DUR,KLOR-CON) 20 MEQ tablet Take 1 tablet (20 mEq total) by mouth 2 (two) times daily. 04/15/16  Yes Alisa Graff, FNP  sacubitril-valsartan (ENTRESTO) 24-26 MG Take 1 tablet by mouth 2 (two) times daily. 08/21/15  Yes Alisa Graff, FNP  Syringe, Disposable, 1 ML MISC 0.1 mLs by Does not apply route as directed. 08/21/15  Yes Alisa Graff, FNP  torsemide (DEMADEX) 20 MG tablet Take 3 tablets (60 mg total) by mouth 2 (two) times daily. 04/15/16  Yes Alisa Graff, FNP  azithromycin (ZITHROMAX Z-PAK) 250 MG tablet Take 1 tablet (250 mg total) by mouth daily. Take 2 tablets today and then 1 tablet daily for the next 4 days 05/20/16   Alisa Graff, FNP  [facility]an[facility] DM) 30-600 MG 12hr tablet Take 1 tablet by mouth 2 (two) times daily as needed for cough. 05/20/16   Alisa Graff, FNP             Review of Systems  Constitutional: Positive for fatigue. Negative for appetite change and fever.  HENT: Negative for congestion, postnasal drip and sore throat.   Eyes: Negative.   Respiratory: Positive for cough (brown sputum), chest tightness ("since having this cough"), shortness of breath and wheezing.   Cardiovascular: Positive for palpitations and leg swelling. Negative for chest pain.  Gastrointestinal: Negative for abdominal distention and abdominal pain.  Endocrine: Negative.   Genitourinary: Negative.   Musculoskeletal: Negative for back pain and neck pain.  Skin: Negative.   Allergic/Immunologic: Negative.   Neurological: Negative for dizziness and light-headedness.  Hematological: Negative for adenopathy. Does not bruise/bleed easily.  Psychiatric/Behavioral: Positive for sleep disturbance (ran out of oxygen 2 days ago). Negative for dysphoric mood. The patient is nervous/anxious.        Objective:   Physical Exam   Constitutional: He is oriented to person, place, and time. He appears well-developed and well-nourished.  HENT:  Head: Normocephalic and atraumatic.  Eyes: Conjunctivae are normal. Pupils are equal, round, and reactive to light.  Neck: Normal range of motion. Neck supple.  Cardiovascular: Normal rate and regular rhythm.   Pulmonary/Chest: Effort normal. He has wheezes in the right lower field and the left lower field. He has rhonchi in the right upper field, the right lower field, the left upper field and the left lower field. He has no rales.  Abdominal: Soft. He exhibits no distension. There is no tenderness.  Musculoskeletal: He exhibits edema (1+ pitting edema in bilateral lower legs). He exhibits no tenderness.  Neurological: He is alert and oriented to person, place, and time.  Skin: Skin is warm and dry.  Psychiatric: He has a normal mood and affect. His behavior is normal. Thought content normal.  Nursing note and vitals  reviewed.  BP 135/88   Pulse 89   Resp 20   Ht 5' 7"  (1.702 m)   Wt (!) 341 lb (154.7 kg)   SpO2 94%   BMI 53.41 kg/m         Assessment & Plan:  1: Chronic heart failure with reduced ejection fraction- Patient presents with fatigue and shortness of breath with little exertion (Class III) which does improve somewhat upon rest. He is minimally fluid overloaded with 1+ pitting edema in bilateral lower legs. Encouraged him to elevate them as much as possible. He is taking torsemide 80m twice daily. He continues to weigh himself and says that he's lost some weight. By our scale, he's lost 6 pounds since he was last here on 04/15/16. Reminded to call for an overnight weight gain of >2 pounds or a weekly weight gain of >5 pounds. He is not adding any salt to his food and is trying to eat low sodium foods. Need to see if we can add metoprolol XL at his next office visit along with titrating up entresto in the future. Bidil gave him migraines. [facility]  [facility]. 2: Bronchitis- He says that he started coughing a few days ago and it's getting worse. Coughing up brown sputum along with having some tightness in his chest. Will treat with zpack along with mucinex. He is to call back if he's not feeling better by the end of the week.  3: Chronic respiratory failure- He had his sleep study done on 05/06/16 and is waiting to hear about the results. Normally wears his oxygen at 2L but he says that he ran completely out 2 days ago. He says that new tanks are supposed to be coming today. He is very sleepy in the office today but does waken easily. 4: Tobacco use- Patient continues to smoke 1/2 ppd of cigarettes daily. Complete cessation discussed for 3 minutes with him.  Patient did not bring his medications nor a list. Each medication was verbally reviewed with the patient and he was encouraged to bring the bottles to every visit to confirm accuracy of list.  Return in 2 weeks for a recheck or sooner for any questions/problems before then.

## 2016-05-20 NOTE — Patient Instructions (Signed)
Take the zpack as directed along with over the counter mucinex DM.   Continue weighing daily and call for an overnight weight gain of > 2 pounds or a weekly weight gain of >5 pounds.  Smoking Cessation Quitting smoking is important to your health and has many advantages. However, it is not always easy to quit since nicotine is a very addictive drug. Oftentimes, people try 3 times or more before being able to quit. This document explains the best ways for you to prepare to quit smoking. Quitting takes hard work and a lot of effort, but you can do it. ADVANTAGES OF QUITTING SMOKING  You will live longer, feel better, and live better.  Your body will feel the impact of quitting smoking almost immediately.  Within 20 minutes, blood pressure decreases. Your pulse returns to its normal level.  After 8 hours, carbon monoxide levels in the blood return to normal. Your oxygen level increases.  After 24 hours, the chance of having a heart attack starts to decrease. Your breath, hair, and body stop smelling like smoke.  After 48 hours, damaged nerve endings begin to recover. Your sense of taste and smell improve.  After 72 hours, the body is virtually free of nicotine. Your bronchial tubes relax and breathing becomes easier.  After 2 to 12 weeks, lungs can hold more air. Exercise becomes easier and circulation improves.  The risk of having a heart attack, stroke, cancer, or lung disease is greatly reduced.  After 1 year, the risk of coronary heart disease is cut in half.  After 5 years, the risk of stroke falls to the same as a nonsmoker.  After 10 years, the risk of lung cancer is cut in half and the risk of other cancers decreases significantly.  After 15 years, the risk of coronary heart disease drops, usually to the level of a nonsmoker.  If you are pregnant, quitting smoking will improve your chances of having a healthy baby.  The people you live with, especially any children, will be  healthier.  You will have extra money to spend on things other than cigarettes. QUESTIONS TO THINK ABOUT BEFORE ATTEMPTING TO QUIT You may want to talk about your answers with your health care provider.  Why do you want to quit?  If you tried to quit in the past, what helped and what did not?  What will be the most difficult situations for you after you quit? How will you plan to handle them?  Who can help you through the tough times? Your family? Friends? A health care provider?  What pleasures do you get from smoking? What ways can you still get pleasure if you quit? Here are some questions to ask your health care provider:  How can you help me to be successful at quitting?  What medicine do you think would be best for me and how should I take it?  What should I do if I need more help?  What is smoking withdrawal like? How can I get information on withdrawal? GET READY  Set a quit date.  Change your environment by getting rid of all cigarettes, ashtrays, matches, and lighters in your home, car, or work. Do not let people smoke in your home.  Review your past attempts to quit. Think about what worked and what did not. GET SUPPORT AND ENCOURAGEMENT You have a better chance of being successful if you have help. You can get support in many ways.  Tell your family, friends, and  coworkers that you are going to quit and need their support. Ask them not to smoke around you.  Get individual, group, or telephone counseling and support. Programs are available at [employer] and health centers. Call your local health department for information about programs in your area.  Spiritual beliefs and practices may help some smokers quit.  Download a "quit meter" on your computer to keep track of quit statistics, such as how long you have gone without smoking, cigarettes not smoked, and money saved.  Get a self-help book about quitting smoking and staying off tobacco. Henry yourself from urges to smoke. Talk to someone, go for a walk, or occupy your time with a task.  Change your normal routine. Take a different route to work. Drink tea instead of coffee. Eat breakfast in a different place.  Reduce your stress. Take a hot bath, exercise, or read a book.  Plan something enjoyable to do every day. Reward yourself for not smoking.  Explore interactive web-based programs that specialize in helping you quit. GET MEDICINE AND USE IT CORRECTLY Medicines can help you stop smoking and decrease the urge to smoke. Combining medicine with the above behavioral methods and support can greatly increase your chances of successfully quitting smoking.  Nicotine replacement therapy helps deliver nicotine to your body without the negative effects and risks of smoking. Nicotine replacement therapy includes nicotine gum, lozenges, inhalers, nasal sprays, and skin patches. Some may be available over-the-counter and others require a prescription.  Antidepressant medicine helps people abstain from smoking, but how this works is unknown. This medicine is available by prescription.  Nicotinic receptor partial agonist medicine simulates the effect of nicotine in your brain. This medicine is available by prescription. Ask your health care provider for advice about which medicines to use and how to use them based on your health history. Your health care provider will tell you what side effects to look out for if you choose to be on a medicine or therapy. Carefully read the information on the package. Do not use any other product containing nicotine while using a nicotine replacement product.  RELAPSE OR DIFFICULT SITUATIONS Most relapses occur within the first 3 months after quitting. Do not be discouraged if you start smoking again. Remember, most people try several times before finally quitting. You may have symptoms of withdrawal because your body is used to nicotine.  You may crave cigarettes, be irritable, feel very hungry, cough often, get headaches, or have difficulty concentrating. The withdrawal symptoms are only temporary. They are strongest when you first quit, but they will go away within 10-14 days. To reduce the chances of relapse, try to:  Avoid drinking alcohol. Drinking lowers your chances of successfully quitting.  Reduce the amount of caffeine you consume. Once you quit smoking, the amount of caffeine in your body increases and can give you symptoms, such as a rapid heartbeat, sweating, and anxiety.  Avoid smokers because they can make you want to smoke.  Do not let weight gain distract you. Many smokers will gain weight when they quit, usually less than 10 pounds. Eat a healthy diet and stay active. You can always lose the weight gained after you quit.  Find ways to improve your mood other than smoking. FOR MORE INFORMATION  www.smokefree.gov  Document Released: 08/12/2001 Document Revised: 01/02/2014 Document Reviewed: 11/27/2011 [facility] Patient Information 2015 [place], [place]. This information is not intended to replace advice given to you by your health  care provider. Make sure you discuss any questions you have with your health care provider.

## 2016-06-05 ENCOUNTER — Ambulatory Visit: Payer: Medicaid Other | Admitting: Family

## 2016-06-17 ENCOUNTER — Ambulatory Visit: Payer: Medicaid Other | Attending: Family | Admitting: Family

## 2016-06-17 ENCOUNTER — Encounter: Payer: Self-pay | Admitting: Family

## 2016-06-17 VITALS — BP 146/105 | HR 94 | Resp 20 | Ht 66.0 in | Wt 353.0 lb

## 2016-06-17 DIAGNOSIS — Z720 Tobacco use: Secondary | ICD-10-CM

## 2016-06-17 DIAGNOSIS — H918X1 Other specified hearing loss, right ear: Secondary | ICD-10-CM | POA: Diagnosis not present

## 2016-06-17 DIAGNOSIS — Z794 Long term (current) use of insulin: Secondary | ICD-10-CM | POA: Diagnosis not present

## 2016-06-17 DIAGNOSIS — Z825 Family history of asthma and other chronic lower respiratory diseases: Secondary | ICD-10-CM | POA: Insufficient documentation

## 2016-06-17 DIAGNOSIS — E785 Hyperlipidemia, unspecified: Secondary | ICD-10-CM | POA: Diagnosis not present

## 2016-06-17 DIAGNOSIS — Z716 Tobacco abuse counseling: Secondary | ICD-10-CM | POA: Diagnosis not present

## 2016-06-17 DIAGNOSIS — G4733 Obstructive sleep apnea (adult) (pediatric): Secondary | ICD-10-CM | POA: Diagnosis not present

## 2016-06-17 DIAGNOSIS — F17210 Nicotine dependence, cigarettes, uncomplicated: Secondary | ICD-10-CM | POA: Diagnosis not present

## 2016-06-17 DIAGNOSIS — I10 Essential (primary) hypertension: Secondary | ICD-10-CM

## 2016-06-17 DIAGNOSIS — Z888 Allergy status to other drugs, medicaments and biological substances status: Secondary | ICD-10-CM | POA: Diagnosis not present

## 2016-06-17 DIAGNOSIS — Z955 Presence of coronary angioplasty implant and graft: Secondary | ICD-10-CM | POA: Diagnosis not present

## 2016-06-17 DIAGNOSIS — E119 Type 2 diabetes mellitus without complications: Secondary | ICD-10-CM | POA: Insufficient documentation

## 2016-06-17 DIAGNOSIS — I110 Hypertensive heart disease with heart failure: Secondary | ICD-10-CM | POA: Diagnosis not present

## 2016-06-17 DIAGNOSIS — I5022 Chronic systolic (congestive) heart failure: Secondary | ICD-10-CM | POA: Diagnosis present

## 2016-06-17 DIAGNOSIS — Z833 Family history of diabetes mellitus: Secondary | ICD-10-CM | POA: Diagnosis not present

## 2016-06-17 DIAGNOSIS — Z8249 Family history of ischemic heart disease and other diseases of the circulatory system: Secondary | ICD-10-CM | POA: Insufficient documentation

## 2016-06-17 DIAGNOSIS — I2510 Atherosclerotic heart disease of native coronary artery without angina pectoris: Secondary | ICD-10-CM | POA: Diagnosis not present

## 2016-06-17 DIAGNOSIS — K219 Gastro-esophageal reflux disease without esophagitis: Secondary | ICD-10-CM | POA: Diagnosis not present

## 2016-06-17 DIAGNOSIS — Z832 Family history of diseases of the blood and blood-forming organs and certain disorders involving the immune mechanism: Secondary | ICD-10-CM | POA: Insufficient documentation

## 2016-06-17 DIAGNOSIS — Z7982 Long term (current) use of aspirin: Secondary | ICD-10-CM | POA: Diagnosis not present

## 2016-06-17 DIAGNOSIS — J45909 Unspecified asthma, uncomplicated: Secondary | ICD-10-CM | POA: Diagnosis not present

## 2016-06-17 DIAGNOSIS — I219 Acute myocardial infarction, unspecified: Secondary | ICD-10-CM | POA: Insufficient documentation

## 2016-06-17 DIAGNOSIS — I429 Cardiomyopathy, unspecified: Secondary | ICD-10-CM | POA: Diagnosis not present

## 2016-06-17 DIAGNOSIS — I252 Old myocardial infarction: Secondary | ICD-10-CM | POA: Insufficient documentation

## 2016-06-17 MED ORDER — METOLAZONE 5 MG PO TABS
5.0000 mg | ORAL_TABLET | Freq: Every day | ORAL | 0 refills | Status: DC
Start: 2016-06-17 — End: 2016-09-25

## 2016-06-17 MED ORDER — SPIRONOLACTONE 25 MG PO TABS: 25.0000 mg | ORAL_TABLET | Freq: Every day | ORAL | 3 refills | Status: DC

## 2016-06-17 NOTE — Patient Instructions (Addendum)
Continue weighing daily and call for an overnight weight gain of > 2 pounds or a weekly weight gain of >5 pounds.  Take metolazone 5mg  daily for the next 3 days. Best to take it 1/2 hour prior to torsemide.  Adding spironolactone 25mg  every day.  Will check lab work next week

## 2016-06-17 NOTE — Progress Notes (Signed)
Patient ID: Oscar [name], male    DOB: 06-19-1976, 40 y.o.   MRN: [id number]  HPI  Mr Rafferty is a 40 y/o male with a history of obstructive sleep apnea (currently without CPAP), MI, HTN, hyperlipidemia, GERD, DM, SVT, asthma and chronic heart failure.  Last echo was done 02/05/14 which showed an EF of 25% which is unchanged from previous echo on 12/10/14 which showed an EF of 25% and mild MR.   Was in the ED on 03/10/16 with HF exacerbation and was treated and released. Prior admission on 11/22/15 was for a NSTEMI. Cardiology consult obtained during hospitalization and his defibrillator was interrogated. Sleep study was done on 05/06/16 but no results at this time.   He presents today for a follow-up visit with fatigue and shortness of breath with little exertion. Denies symptoms at rest. Also endorses abdominal distention but no pedal edema. Weight at home is going up. Wears oxygen at 2L. Has not gotten CPAP supplies as of yet. Continues to smoke  Past Medical History:  Diagnosis Date  . Asthma   . Cardiomyopathy ([place])   . CHF (congestive heart failure) ([place])   . Coronary artery disease   . Deafness in right ear   . Diabetes mellitus without complication ([place])   . Dysrhythmia    svt  . 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 GERD (gastroesophageal reflux disease)   . Hyperlipidemia   . Hypertension   . Hypoxemia   . Myocardial infarction    2012,2014,2/16  . Sleep apnea    osa    Past Surgical History:  Procedure Laterality Date  . CARDIAC CATHETERIZATION  12/11/2014   Procedure: RIGHT/LEFT HEART CATH AND CORONARY ANGIOGRAPHY;  Surgeon: Lorretta Harp, MD;  Location: [facility] CATH LAB;  Service: Cardiovascular;;  . ICD LEAD REMOVAL N/A 03/30/2015   Procedure: ICD LEAD REMOVAL;  Surgeon: Marzetta Board, MD;  Location: [facility] ORS;  Service: Cardiovascular;  Laterality: N/A;  . IMPLANTABLE CARDIOVERTER DEFIBRILLATOR IMPLANT    . INSERT / REPLACE / REMOVE PACEMAKER    . LEFT  HEART CATHETERIZATION WITH CORONARY ANGIOGRAM N/A 12/09/2014   Procedure: LEFT HEART CATHETERIZATION WITH CORONARY ANGIOGRAM;  Surgeon: Burnell Blanks, MD;  Location: [facility] CATH LAB;  Service: Cardiovascular;  Laterality: N/A;    Family History  Problem Relation Age of Onset  . Hypertension Mother   . Congestive Heart Failure Mother   . Hypertension Sister   . Diabetes Sister   . Pancreatitis Sister   . COPD Sister   . Pancreatitis Brother   . Anemia Neg Hx   . Arrhythmia Neg Hx   . Asthma Neg Hx   . Clotting disorder Neg Hx   . Fainting Neg Hx   . Heart attack Neg Hx   . Heart disease Neg Hx   . Heart failure Neg Hx   . Hyperlipidemia Neg Hx     Social History  Substance Use Topics  . Smoking status: Current Every Day Smoker    Packs/day: 0.50    Years: 24.00    Types: Cigarettes  . Smokeless tobacco: Never Used  . Alcohol use No    Allergies  Allergen Reactions  . Bidil [Isosorb Dinitrate-Hydralazine] Other (See Comments)    Migraine Headache    Prior to Admission medications   Medication Sig Start Date End Date Taking? Authorizing Provider  aspirin EC 325 MG tablet Take 1 tablet (325 mg total) by mouth daily. 07/10/15  Yes Aldean Jewett, MD  dextromethorphan-guaiFENesin [facility] DM) 30-600 MG 12hr tablet Take 1 tablet by mouth 2 (two) times daily as needed for cough. 05/20/16  Yes Alisa Graff, FNP  insulin glargine (LANTUS) 100 UNIT/ML injection Inject 0.1 mLs (10 Units total) into the skin at bedtime. Patient taking differently: Inject 15 Units into the skin at bedtime.  12/07/15  Yes Alisa Graff, FNP  insulin starter kit- syringes MISC 1 kit by Other route once. 07/10/15  Yes Aldean Jewett, MD  metFORMIN (GLUCOPHAGE) 1000 MG tablet Take 1,000 mg by mouth 2 (two) times daily with a meal.   Yes Historical Provider, MD  omeprazole (PRILOSEC) 20 MG capsule Take 1 capsule (20 mg total) by mouth daily. 04/01/16  Yes Shannon A McGowan, PA-C  potassium chloride  SA (K-DUR,KLOR-CON) 20 MEQ tablet Take 1 tablet (20 mEq total) by mouth 2 (two) times daily. 04/15/16  Yes Alisa Graff, FNP  sacubitril-valsartan (ENTRESTO) 24-26 MG Take 1 tablet by mouth 2 (two) times daily. 08/21/15  Yes Alisa Graff, FNP  Syringe, Disposable, 1 ML MISC 0.1 mLs by Does not apply route as directed. 08/21/15  Yes Alisa Graff, FNP  torsemide (DEMADEX) 20 MG tablet Take 3 tablets (60 mg total) by mouth 2 (two) times daily. Patient taking differently: Take 20 mg by mouth 3 (three) times daily.  04/15/16  Yes Alisa Graff, FNP  metolazone (ZAROXOLYN) 5 MG tablet Take 1 tablet (5 mg total) by mouth daily. 06/17/16 09/15/16  Alisa Graff, FNP  spironolactone (ALDACTONE) 25 MG tablet Take 1 tablet (25 mg total) by mouth daily. 06/17/16 07/17/16  Alisa Graff, FNP     Review of Systems  Constitutional: Positive for fatigue. Negative for appetite change.  HENT: Negative for congestion, postnasal drip and sore throat.   Eyes: Negative.   Respiratory: Positive for cough, chest tightness, shortness of breath and wheezing.   Cardiovascular: Negative for chest pain, palpitations and leg swelling.  Gastrointestinal: Positive for abdominal distention. Negative for abdominal pain.  Endocrine: Negative.   Genitourinary: Negative.   Musculoskeletal: Negative for back pain and neck pain.  Skin: Negative.   Allergic/Immunologic: Negative.   Neurological: Negative for dizziness and light-headedness.  Hematological: Negative for adenopathy. Does not bruise/bleed easily.  Psychiatric/Behavioral: Positive for sleep disturbance (oxygen at 2L at home). Negative for dysphoric mood. The patient is not nervous/anxious.    Vitals:   06/17/16 1039  BP: (!) 146/105  Pulse: 94  Resp: 20  SpO2: 94%  Weight: (!) 353 lb (160.1 kg)  Height: '5\' 6"'$  (1.676 m)      Physical Exam  Constitutional: He is oriented to person, place, and time. He appears well-developed and well-nourished.  HENT:   Head: Normocephalic and atraumatic.  Eyes: Conjunctivae are normal. Pupils are equal, round, and reactive to light.  Neck: Normal range of motion. Neck supple.  Cardiovascular: Regular rhythm.  Tachycardia present.   Pulmonary/Chest: Effort normal. He has wheezes in the right upper field and the left lower field. He has no rales.  Abdominal: He exhibits distension. There is no tenderness.  Musculoskeletal: He exhibits no edema or tenderness.  Neurological: He is alert and oriented to person, place, and time.  Skin: Skin is warm and dry.  Psychiatric: He has a normal mood and affect. His behavior is normal. Thought content normal.  Nursing note and vitals reviewed.  Assessment & Plan:  1: Chronic heart failure with reduced ejection fraction- - NYHA  class III - moderately fluid overloaded today - weight up 12 pounds since last here on 05/20/16 - metolazone 20m added daily for the next 3 days. Take it 1/2 hour prior to torsemide - add spironolactone 267mdaily. May need potassium decreased - pharmD reviewed medications - check BMP at next visit next week - last saw cardiology 01/16/16. Needs appointment scheduled  2: HTN- - BP elevated today but he's 12 pounds heavier from 1 month ago - Med changes per above  3: Obstructive sleep apnea- - sleep study done but hasn't heard anything - falling asleep while in the exam room  - wearing oxygen at 2L - CMA to call sleep med to see what the status of sleep study results are  4: Tobacco use- - continues to smoke 4 cigarettes daily - does remove himself from the oxygen when smoking - complete cessation discussed for 3 minutes with him  Return in 1 week for a recheck and to check lab work

## 2016-06-25 ENCOUNTER — Ambulatory Visit: Payer: Medicaid Other | Attending: Otolaryngology

## 2016-06-25 DIAGNOSIS — G4733 Obstructive sleep apnea (adult) (pediatric): Secondary | ICD-10-CM | POA: Diagnosis present

## 2016-06-26 ENCOUNTER — Telehealth: Payer: Self-pay | Admitting: Family

## 2016-06-26 ENCOUNTER — Ambulatory Visit: Payer: Medicaid Other | Admitting: Family

## 2016-06-26 NOTE — Telephone Encounter (Signed)
Patient did not show for his [facility] appointment on 06/26/16. Will attempt to reschedule.

## 2016-07-06 ENCOUNTER — Encounter: Payer: Self-pay | Admitting: Emergency Medicine

## 2016-07-06 ENCOUNTER — Emergency Department: Payer: Medicaid Other

## 2016-07-06 ENCOUNTER — Inpatient Hospital Stay
Admission: EM | Admit: 2016-07-06 | Discharge: 2016-07-07 | DRG: 439 | Disposition: A | Discharge status: Home / Self Care | Payer: Medicaid Other | Attending: Internal Medicine | Admitting: Internal Medicine

## 2016-07-06 ENCOUNTER — Other Ambulatory Visit: Payer: Self-pay

## 2016-07-06 DIAGNOSIS — J9610 Chronic respiratory failure, unspecified whether with hypoxia or hypercapnia: Secondary | ICD-10-CM | POA: Diagnosis present

## 2016-07-06 DIAGNOSIS — J45909 Unspecified asthma, uncomplicated: Secondary | ICD-10-CM | POA: Diagnosis present

## 2016-07-06 DIAGNOSIS — I429 Cardiomyopathy, unspecified: Secondary | ICD-10-CM | POA: Diagnosis present

## 2016-07-06 DIAGNOSIS — I5022 Chronic systolic (congestive) heart failure: Secondary | ICD-10-CM | POA: Diagnosis present

## 2016-07-06 DIAGNOSIS — I110 Hypertensive heart disease with heart failure: Secondary | ICD-10-CM | POA: Diagnosis present

## 2016-07-06 DIAGNOSIS — Z8249 Family history of ischemic heart disease and other diseases of the circulatory system: Secondary | ICD-10-CM | POA: Diagnosis not present

## 2016-07-06 DIAGNOSIS — Z794 Long term (current) use of insulin: Secondary | ICD-10-CM | POA: Diagnosis not present

## 2016-07-06 DIAGNOSIS — E119 Type 2 diabetes mellitus without complications: Secondary | ICD-10-CM | POA: Diagnosis present

## 2016-07-06 DIAGNOSIS — H9191 Unspecified hearing loss, right ear: Secondary | ICD-10-CM | POA: Diagnosis present

## 2016-07-06 DIAGNOSIS — F17210 Nicotine dependence, cigarettes, uncomplicated: Secondary | ICD-10-CM | POA: Diagnosis present

## 2016-07-06 DIAGNOSIS — Z9981 Dependence on supplemental oxygen: Secondary | ICD-10-CM

## 2016-07-06 DIAGNOSIS — I252 Old myocardial infarction: Secondary | ICD-10-CM | POA: Diagnosis not present

## 2016-07-06 DIAGNOSIS — K859 Acute pancreatitis without necrosis or infection, unspecified: Principal | ICD-10-CM | POA: Diagnosis present

## 2016-07-06 DIAGNOSIS — Z9889 Other specified postprocedural states: Secondary | ICD-10-CM

## 2016-07-06 DIAGNOSIS — R079 Chest pain, unspecified: Secondary | ICD-10-CM

## 2016-07-06 DIAGNOSIS — K210 Gastro-esophageal reflux disease with esophagitis: Secondary | ICD-10-CM | POA: Diagnosis present

## 2016-07-06 DIAGNOSIS — K853 Drug induced acute pancreatitis without necrosis or infection: Secondary | ICD-10-CM | POA: Diagnosis present

## 2016-07-06 DIAGNOSIS — Z79899 Other long term (current) drug therapy: Secondary | ICD-10-CM | POA: Diagnosis not present

## 2016-07-06 DIAGNOSIS — Z833 Family history of diabetes mellitus: Secondary | ICD-10-CM | POA: Diagnosis not present

## 2016-07-06 DIAGNOSIS — Z825 Family history of asthma and other chronic lower respiratory diseases: Secondary | ICD-10-CM

## 2016-07-06 DIAGNOSIS — G4733 Obstructive sleep apnea (adult) (pediatric): Secondary | ICD-10-CM | POA: Diagnosis present

## 2016-07-06 DIAGNOSIS — I2510 Atherosclerotic heart disease of native coronary artery without angina pectoris: Secondary | ICD-10-CM | POA: Diagnosis present

## 2016-07-06 DIAGNOSIS — Z888 Allergy status to other drugs, medicaments and biological substances status: Secondary | ICD-10-CM | POA: Diagnosis not present

## 2016-07-06 DIAGNOSIS — Z7982 Long term (current) use of aspirin: Secondary | ICD-10-CM

## 2016-07-06 HISTORY — DX: Acute pancreatitis without necrosis or infection, unspecified: K85.90

## 2016-07-06 LAB — HEPATIC FUNCTION PANEL
ALBUMIN: 3.6 g/dL (ref 3.5–5.0)
ALT: 34 U/L (ref 17–63)
AST: 27 U/L (ref 15–41)
Alkaline Phosphatase: 171 U/L — ABNORMAL HIGH (ref 38–126)
Bilirubin, Direct: 0.1 mg/dL (ref 0.1–0.5)
Indirect Bilirubin: 0.6 mg/dL (ref 0.3–0.9)
TOTAL PROTEIN: 8.2 g/dL — AB (ref 6.5–8.1)
Total Bilirubin: 0.7 mg/dL (ref 0.3–1.2)

## 2016-07-06 LAB — CBC
HCT: 49 % (ref 40.0–52.0)
HEMOGLOBIN: 17.3 g/dL (ref 13.0–18.0)
MCH: 33.2 pg (ref 26.0–34.0)
MCHC: 35.4 g/dL (ref 32.0–36.0)
MCV: 93.9 fL (ref 80.0–100.0)
Platelets: 159 10*3/uL (ref 150–440)
RBC: 5.21 MIL/uL (ref 4.40–5.90)
RDW: 13.4 % (ref 11.5–14.5)
WBC: 5.8 10*3/uL (ref 3.8–10.6)

## 2016-07-06 LAB — GLUCOSE, CAPILLARY
GLUCOSE-CAPILLARY: 181 mg/dL — AB (ref 65–99)
Glucose-Capillary: 148 mg/dL — ABNORMAL HIGH (ref 65–99)

## 2016-07-06 LAB — TROPONIN I
TROPONIN I: 0.1 ng/mL — AB (ref <0.03)
Troponin I: 0.07 ng/mL (ref <0.03)

## 2016-07-06 LAB — BASIC METABOLIC PANEL
ANION GAP: 8 (ref 5–15)
BUN: 17 mg/dL (ref 6–20)
CALCIUM: 8.7 mg/dL — AB (ref 8.9–10.3)
CO2: 31 mmol/L (ref 22–32)
Chloride: 94 mmol/L — ABNORMAL LOW (ref 101–111)
Creatinine, Ser: 0.93 mg/dL (ref 0.61–1.24)
Glucose, Bld: 247 mg/dL — ABNORMAL HIGH (ref 65–99)
Potassium: 4.1 mmol/L (ref 3.5–5.1)
Sodium: 133 mmol/L — ABNORMAL LOW (ref 135–145)

## 2016-07-06 LAB — LIPASE, BLOOD: LIPASE: 515 U/L — AB (ref 11–51)

## 2016-07-06 LAB — BRAIN NATRIURETIC PEPTIDE: B Natriuretic Peptide: 136 pg/mL — ABNORMAL HIGH (ref 0.0–100.0)

## 2016-07-06 MED ORDER — NITROGLYCERIN 2 % TD OINT: 1.0000 [in_us] | TOPICAL_OINTMENT | Freq: Three times a day (TID) | TRANSDERMAL | Status: DC

## 2016-07-06 MED ORDER — SODIUM CHLORIDE 0.9 % IV SOLN: 250.0000 mL | INTRAVENOUS | Status: DC | PRN

## 2016-07-06 MED ORDER — GI COCKTAIL ~~LOC~~
30.0000 mL | Freq: Once | ORAL | Status: AC
  Administered 2016-07-06: 30 mL via ORAL
  Filled 2016-07-06: qty 30

## 2016-07-06 MED ORDER — METOLAZONE 5 MG PO TABS
5.0000 mg | ORAL_TABLET | Freq: Every day | ORAL | Status: DC
  Administered 2016-07-07: 5 mg via ORAL
  Filled 2016-07-06: qty 1

## 2016-07-06 MED ORDER — IOPAMIDOL (ISOVUE-300) INJECTION 61%
125.0000 mL | Freq: Once | INTRAVENOUS | Status: AC | PRN
  Administered 2016-07-06: 125 mL via INTRAVENOUS

## 2016-07-06 MED ORDER — SODIUM CHLORIDE 0.9% FLUSH: 3.0000 mL | Freq: Two times a day (BID) | INTRAVENOUS | Status: DC

## 2016-07-06 MED ORDER — HYDROMORPHONE HCL 1 MG/ML IJ SOLN
1.0000 mg | Freq: Once | INTRAMUSCULAR | Status: AC
  Administered 2016-07-06: 1 mg via INTRAVENOUS
  Filled 2016-07-06: qty 1

## 2016-07-06 MED ORDER — INSULIN GLARGINE 100 UNIT/ML ~~LOC~~ SOLN
7.0000 [IU] | Freq: Every day | SUBCUTANEOUS | Status: DC
  Administered 2016-07-06: 7 [IU] via SUBCUTANEOUS
  Filled 2016-07-06 (×2): qty 0.07

## 2016-07-06 MED ORDER — ENOXAPARIN SODIUM 40 MG/0.4ML ~~LOC~~ SOLN
40.0000 mg | Freq: Two times a day (BID) | SUBCUTANEOUS | Status: DC
  Administered 2016-07-06 – 2016-07-07 (×2): 40 mg via SUBCUTANEOUS
  Filled 2016-07-06 (×2): qty 0.4

## 2016-07-06 MED ORDER — ORAL CARE MOUTH RINSE: 15.0000 mL | Freq: Two times a day (BID) | OROMUCOSAL | Status: DC

## 2016-07-06 MED ORDER — SODIUM CHLORIDE 0.9% FLUSH: 3.0000 mL | INTRAVENOUS | Status: DC | PRN

## 2016-07-06 MED ORDER — TORSEMIDE 20 MG PO TABS
60.0000 mg | ORAL_TABLET | Freq: Two times a day (BID) | ORAL | Status: DC
  Administered 2016-07-07: 60 mg via ORAL
  Filled 2016-07-06: qty 3

## 2016-07-06 MED ORDER — SODIUM CHLORIDE 0.9 % IV SOLN
INTRAVENOUS | Status: DC
  Administered 2016-07-06 – 2016-07-07 (×2): via INTRAVENOUS

## 2016-07-06 MED ORDER — SACUBITRIL-VALSARTAN 24-26 MG PO TABS
1.0000 | ORAL_TABLET | Freq: Two times a day (BID) | ORAL | Status: DC
  Administered 2016-07-06 – 2016-07-07 (×2): 1 via ORAL
  Filled 2016-07-06 (×3): qty 1

## 2016-07-06 MED ORDER — HYDROCODONE-ACETAMINOPHEN 5-325 MG PO TABS: 1.0000 | ORAL_TABLET | ORAL | Status: DC | PRN

## 2016-07-06 MED ORDER — SPIRONOLACTONE 25 MG PO TABS
25.0000 mg | ORAL_TABLET | Freq: Every day | ORAL | Status: DC
  Administered 2016-07-07: 25 mg via ORAL
  Filled 2016-07-06: qty 1

## 2016-07-06 MED ORDER — ACETAMINOPHEN 650 MG RE SUPP: 650.0000 mg | Freq: Four times a day (QID) | RECTAL | Status: DC | PRN

## 2016-07-06 MED ORDER — ONDANSETRON HCL 4 MG/2ML IJ SOLN
4.0000 mg | Freq: Four times a day (QID) | INTRAMUSCULAR | Status: DC | PRN
Start: 2016-07-06 — End: 2016-07-07

## 2016-07-06 MED ORDER — ACETAMINOPHEN 325 MG PO TABS
650.0000 mg | ORAL_TABLET | Freq: Once | ORAL | Status: AC
  Administered 2016-07-06: 650 mg via ORAL
  Filled 2016-07-06: qty 2

## 2016-07-06 MED ORDER — ASPIRIN EC 325 MG PO TBEC
325.0000 mg | DELAYED_RELEASE_TABLET | Freq: Every day | ORAL | Status: DC
  Administered 2016-07-07: 325 mg via ORAL
  Filled 2016-07-06: qty 1

## 2016-07-06 MED ORDER — POTASSIUM CHLORIDE CRYS ER 20 MEQ PO TBCR
20.0000 meq | EXTENDED_RELEASE_TABLET | Freq: Two times a day (BID) | ORAL | Status: DC
  Administered 2016-07-07: 20 meq via ORAL
  Filled 2016-07-06: qty 1

## 2016-07-06 MED ORDER — MORPHINE SULFATE (PF) 2 MG/ML IV SOLN
2.0000 mg | INTRAVENOUS | Status: DC | PRN
  Administered 2016-07-07: 2 mg via INTRAVENOUS
  Filled 2016-07-06: qty 1

## 2016-07-06 MED ORDER — ONDANSETRON HCL 4 MG/2ML IJ SOLN
4.0000 mg | Freq: Once | INTRAMUSCULAR | Status: AC
  Administered 2016-07-06: 4 mg via INTRAVENOUS
  Filled 2016-07-06: qty 2

## 2016-07-06 MED ORDER — ONDANSETRON HCL 4 MG PO TABS: 4.0000 mg | ORAL_TABLET | Freq: Four times a day (QID) | ORAL | Status: DC | PRN

## 2016-07-06 MED ORDER — IOPAMIDOL (ISOVUE-300) INJECTION 61%
30.0000 mL | Freq: Once | INTRAVENOUS | Status: AC | PRN
  Administered 2016-07-06: 30 mL via ORAL

## 2016-07-06 MED ORDER — NITROGLYCERIN 2 % TD OINT
1.0000 [in_us] | TOPICAL_OINTMENT | Freq: Once | TRANSDERMAL | Status: AC
  Administered 2016-07-06: 1 [in_us] via TOPICAL
  Filled 2016-07-06: qty 1

## 2016-07-06 MED ORDER — ACETAMINOPHEN 325 MG PO TABS: 650.0000 mg | ORAL_TABLET | Freq: Four times a day (QID) | ORAL | Status: DC | PRN

## 2016-07-06 MED ORDER — INSULIN ASPART 100 UNIT/ML ~~LOC~~ SOLN
0.0000 [IU] | Freq: Three times a day (TID) | SUBCUTANEOUS | Status: DC
  Administered 2016-07-06 – 2016-07-07 (×2): 2 [IU] via SUBCUTANEOUS
  Administered 2016-07-07: 3 [IU] via SUBCUTANEOUS
  Filled 2016-07-06 (×2): qty 2
  Filled 2016-07-06: qty 3

## 2016-07-06 NOTE — Progress Notes (Signed)
Pt placed on [facility] CPAP C-3. CPAP plugged into red outlet. 2L O2 in line.

## 2016-07-06 NOTE — ED Triage Notes (Signed)
Pt presents to ED c/o epigastric pain radiating into central chest. Hx Cardiac cath and ICD placement 4/17. Pain is 6/10, started last night, radiating into R arm. C/o nausea and lightheadedness. 2L chronic home O2.

## 2016-07-06 NOTE — ED Notes (Signed)
Pt HTN, MD made aware.

## 2016-07-06 NOTE — Progress Notes (Signed)
PHARMACIST - PHYSICIAN COMMUNICATION  CONCERNING:  Enoxaparin (Lovenox) for DVT Prophylaxis    RECOMMENDATION: Patient was prescribed enoxaprin 40mg  q24 hours for VTE prophylaxis.   Filed Weights   07/06/16 1154 07/06/16 1746  Weight: (!) 334 lb (151.5 kg) (!) 340 lb 3.2 oz (154.3 kg)    Body mass index is 60.26 kg/m.  Estimated Creatinine Clearance: 141.8 mL/min (by C-G formula based on SCr of 0.93 mg/dL).   Based on [place] patient is candidate for enoxaparin 40mg  every 12 hour dosing due to BMI being >40.   DESCRIPTION: Pharmacy has adjusted enoxaparin dose per [facility] policy.  Patient is now receiving enoxaparin 40mg  every 12 hours.    Nancy Fetter, PharmD Clinical Pharmacist  07/06/2016 5:49 PM

## 2016-07-06 NOTE — Progress Notes (Signed)
Pt. admitted to unit, [id number] from ED, report from Marrion Coy., RN. Oriented to room, call bell, Ascom phones and staff. Bed in low position. Fall safety plan reviewed, blue non-skid socks in place. Full assessment to Epic; skin assessed with Gildardo Pounds, RN, CDI. Telemetry box verified with CCMD by Lurlean Horns, RN and Gildardo Pounds RN: [phone]. Will continue to monitor.

## 2016-07-06 NOTE — ED Notes (Signed)
CT informed pt completed oral intake of contrast.

## 2016-07-06 NOTE — ED Notes (Signed)
Critical Troponin MD made aware.

## 2016-07-06 NOTE — H&P (Signed)
[place] [facility] [place] NAME: Oscar [name]    MR#:  [id number]  DATE OF BIRTH:  03-Sep-1975  DATE OF ADMISSION:  07/06/2016  PRIMARY CARE PHYSICIAN: Yolonda Kida, MD   REQUESTING/REFERRING PHYSICIAN: Leilani Merl MD  CHIEF COMPLAINT:   Chief Complaint  Patient presents with  . Abdominal Pain  . Chest Pain    HISTORY OF PRESENT ILLNESS: Oscar [name]  is a 40 y.o. male with a known history of  Nonischemic cardiomyopathy, asthma, congestive heart failure, diabetes type 2, chronic respiratory failure on 2 L of oxygen at all times. As well as sleep apnea and uses CPAP at nighttime is presenting with complaint of chest pain and abdominal pain. Patient reports that he started having chest tightness 2 days ago. It comes and goes. There are no loss of relieving or exacerbations factors.   His symptoms lasted for a few hours yesterday. And then reoccurred this morning. He denies any radiation of the pain. He did have some shortness of breath which is chronic. Patient also reports that he's been having constant epigastric pain ongoing for the past 2 days as well. He has not had any nausea or vomiting. Denies any fevers or chills. Patient had a lipase that was elevated had a CT scan of the abdomen which showed findings consistent with acute pancreatitis. Patient denies drinking alcohol. He does report that he has a history of pancreatitis. But not sure why. PAST MEDICAL HISTORY:   Past Medical History:  Diagnosis Date  . Asthma   . Cardiomyopathy ([place])   . CHF (congestive heart failure) ([place])   . Coronary artery disease   . Deafness in right ear   . Diabetes mellitus without complication ([place])   . Dysrhythmia    svt  . 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 GERD (gastroesophageal reflux disease)   . Hyperlipidemia   . Hypertension   . Hypoxemia   . Myocardial infarction    2012,2014,2/16  . Sleep apnea    osa    PAST  SURGICAL HISTORY: Past Surgical History:  Procedure Laterality Date  . CARDIAC CATHETERIZATION  12/11/2014   Procedure: RIGHT/LEFT HEART CATH AND CORONARY ANGIOGRAPHY;  Surgeon: Lorretta Harp, MD;  Location: [facility] CATH LAB;  Service: Cardiovascular;;  . ICD LEAD REMOVAL N/A 03/30/2015   Procedure: ICD LEAD REMOVAL;  Surgeon: Marzetta Board, MD;  Location: [facility] ORS;  Service: Cardiovascular;  Laterality: N/A;  . IMPLANTABLE CARDIOVERTER DEFIBRILLATOR IMPLANT    . INSERT / REPLACE / REMOVE PACEMAKER    . LEFT HEART CATHETERIZATION WITH CORONARY ANGIOGRAM N/A 12/09/2014   Procedure: LEFT HEART CATHETERIZATION WITH CORONARY ANGIOGRAM;  Surgeon: Burnell Blanks, MD;  Location[facility] C[facility] LAB;  Service: Cardiovascular;  Laterality: N/A;    SOCIAL HISTORY:  Social History  Substance Use Topics  . Smoking status: Current Every Day Smoker    Packs/day: 0.50    Years: 24.00    Types: Cigarettes  . Smokeless tobacco: Never Used  . Alcohol use No    FAMILY HISTORY:  Family History  Problem Relation Age of Onset  . Hypertension Mother   . Congestive Heart Failure Mother   . Hypertension Sister   . Diabetes Sister   . Pancreatitis Sister   . COPD Sister   . Pancreatitis Brother   . Anemia Neg Hx   . Arrhythmia Neg Hx   . Asthma Neg Hx   . Clotting  disorder Neg Hx   . Fainting Neg Hx   . Heart attack Neg Hx   . Heart disease Neg Hx   . Heart failure Neg Hx   . Hyperlipidemia Neg Hx     DRUG ALLERGIES:  Allergies  Allergen Reactions  . Bidil [Isosorb Dinitrate-Hydralazine] Other (See Comments)    Migraine Headache    REVIEW OF SYSTEMS:   CONSTITUTIONAL: No fever, fatigue or weakness.  EYES: No blurred or double vision.  EARS, NOSE, AND THROAT: No tinnitus or ear pain.  RESPIRATORY: No cough, shortness of breath, wheezing or hemoptysis.  CARDIOVASCULAR: Positive chest pain, orthopnea, edema.  GASTROINTESTINAL: No nausea, vomiting, diarrhea or positive abdominal pain.   GENITOURINARY: No dysuria, hematuria.  ENDOCRINE: No polyuria, nocturia,  HEMATOLOGY: No anemia, easy bruising or bleeding SKIN: No rash or lesion. MUSCULOSKELETAL: No joint pain or arthritis.   NEUROLOGIC: No tingling, numbness, weakness.  PSYCHIATRY: No anxiety or depression.   MEDICATIONS AT HOME:  Prior to Admission medications   Medication Sig Start Date End Date Taking? Authorizing Provider  aspirin EC 325 MG tablet Take 1 tablet (325 mg total) by mouth daily. 07/10/15  Yes Aldean Jewett, MD  insulin glargine (LANTUS) 100 UNIT/ML injection Inject 0.1 mLs (10 Units total) into the skin at bedtime. Patient taking differently: Inject 15 Units into the skin at bedtime.  12/07/15  Yes Alisa Graff, FNP  metFORMIN (GLUCOPHAGE) 1000 MG tablet Take 1,000 mg by mouth 2 (two) times daily with a meal.   Yes Historical Provider, MD  metolazone (ZAROXOLYN) 5 MG tablet Take 1 tablet (5 mg total) by mouth daily. 06/17/16 09/15/16 Yes Alisa Graff, FNP  omeprazole (PRILOSEC) 20 MG capsule Take 1 capsule (20 mg total) by mouth daily. 04/01/16  Yes Shannon A McGowan, PA-C  polyethylene glycol (MIRALAX / GLYCOLAX) packet Take 17 g by mouth daily.   Yes Historical Provider, MD  potassium chloride SA (K-DUR,KLOR-CON) 20 MEQ tablet Take 1 tablet (20 mEq total) by mouth 2 (two) times daily. 04/15/16  Yes Alisa Graff, FNP  sacubitril-valsartan (ENTRESTO) 24-26 MG Take 1 tablet by mouth 2 (two) times daily. 08/21/15  Yes Alisa Graff, FNP  torsemide (DEMADEX) 20 MG tablet Take 3 tablets (60 mg total) by mouth 2 (two) times daily. Patient taking differently: Take 20 mg by mouth 3 (three) times daily.  04/15/16  Yes Alisa Graff, FNP  insulin starter kit- syringes MISC 1 kit by Other route once. 07/10/15   Aldean Jewett, MD  spironolactone (ALDACTONE) 25 MG tablet Take 1 tablet (25 mg total) by mouth daily. Patient not taking: Reported on 07/06/2016 06/17/16 07/17/16  Alisa Graff, FNP  Syringe,  Disposable, 1 ML MISC 0.1 mLs by Does not apply route as directed. 08/21/15   Alisa Graff, FNP      PHYSICAL EXAMINATION:   VITAL SIGNS: Blood pressure (!) 163/109, pulse 85, temperature 97.8 F (36.6 C), temperature source Oral, resp. rate (!) 21, height 5' 4"  (1.626 m), weight (!) 334 lb (151.5 kg), SpO2 97 %.  GENERAL:  40 y.o.-year-old patient lying in the bed with no acute distress.  EYES: Pupils equal, round, reactive to light and accommodation. No scleral icterus. Extraocular muscles intact.  HEENT: Head atraumatic, normocephalic. Oropharynx and nasopharynx clear.  NECK:  Supple, no jugular venous distention. No thyroid enlargement, no tenderness.  LUNGS: Normal breath sounds bilaterally, no wheezing, rales,rhonchi or crepitation. No use of accessory muscles of respiration.  CARDIOVASCULAR: S1,  S2 normal. No murmurs, rubs, or gallops.  ABDOMEN: Soft, Epigastric tenderness nondistended. Bowel sounds present. No organomegaly or mass.  EXTREMITIES: No pedal edema, cyanosis, or clubbing.  NEUROLOGIC: Cranial nerves II through XII are intact. Muscle strength 5/5 in all extremities. Sensation intact. Gait not checked.  PSYCHIATRIC: The patient is alert and oriented x 3.  SKIN: No obvious rash, lesion, or ulcer.   LABORATORY PANEL:   CBC  Recent Labs Lab 07/06/16 1158  WBC 5.8  HGB 17.3  HCT 49.0  PLT 159  MCV 93.9  MCH 33.2  MCHC 35.4  RDW 13.4   ------------------------------------------------------------------------------------------------------------------  Chemistries   Recent Labs Lab 07/06/16 1158  NA 133*  K 4.1  CL 94*  CO2 31  GLUCOSE 247*  BUN 17  CREATININE 0.93  CALCIUM 8.7*  AST 27  ALT 34  ALKPHOS 171*  BILITOT 0.7   ------------------------------------------------------------------------------------------------------------------ estimated creatinine clearance is 142.1 mL/min (by C-G formula based on SCr of 0.93  mg/dL). ------------------------------------------------------------------------------------------------------------------ No results for input(s): TSH, T4TOTAL, T3FREE, THYROIDAB in the last 72 hours.  Invalid input(s): FREET3   Coagulation profile No results for input(s): INR, PROTIME in the last 168 hours. ------------------------------------------------------------------------------------------------------------------- No results for input(s): DDIMER in the last 72 hours. -------------------------------------------------------------------------------------------------------------------  Cardiac Enzymes  Recent Labs Lab 07/06/16 1158  TROPONINI 0.10*   ------------------------------------------------------------------------------------------------------------------ Invalid input(s): POCBNP  ---------------------------------------------------------------------------------------------------------------  Urinalysis    Component Value Date/Time   COLORURINE AMBER (A) 10/26/2015 0932   APPEARANCEUR HAZY (A) 10/26/2015 0932   APPEARANCEUR Hazy 08/21/2014 0800   LABSPEC 1.024 10/26/2015 0932   LABSPEC 1.015 08/21/2014 0800   PHURINE 5.0 10/26/2015 0932   GLUCOSEU 50 (A) 10/26/2015 0932   GLUCOSEU Negative 08/21/2014 0800   HGBUR NEGATIVE 10/26/2015 0932   BILIRUBINUR NEGATIVE 10/26/2015 0932   BILIRUBINUR Negative 08/21/2014 0800   KETONESUR TRACE (A) 10/26/2015 0932   PROTEINUR >500 (A) 10/26/2015 0932   NITRITE NEGATIVE 10/26/2015 0932   LEUKOCYTESUR 1+ (A) 10/26/2015 0932   LEUKOCYTESUR 1+ 08/21/2014 0800     RADIOLOGY: Dg Chest 2 View  Result Date: 07/06/2016 CLINICAL DATA:  Chest pain EXAM: CHEST  2 VIEW COMPARISON:  03/10/2016 chest radiograph. FINDINGS: Left subclavian 2 lead ICD is stable in configuration. Stable cardiomediastinal silhouette with mild cardiomegaly. No pneumothorax. No pleural effusion. Lungs appear clear, with no acute consolidative airspace  disease and no pulmonary edema. IMPRESSION: Stable mild cardiomegaly without pulmonary edema. No active pulmonary disease. Electronically Signed   By: Ilona Sorrel M.D.   On: 07/06/2016 12:25   Ct Abdomen Pelvis W Contrast  Result Date: 07/06/2016 CLINICAL DATA:  Epigastric pain for 3 days with nausea and vomiting. Elevated lipase. EXAM: CT ABDOMEN AND PELVIS WITH CONTRAST TECHNIQUE: Multidetector CT imaging of the abdomen and pelvis was performed using the standard protocol following bolus administration of intravenous contrast. CONTRAST:  127m ISOVUE-300 IOPAMIDOL (ISOVUE-300) INJECTION 61% COMPARISON:  11/07/2015 FINDINGS: Lower chest: Clear lung bases. Incompletely imaged pacer. Mild cardiomegaly, without pericardial or pleural effusion. Hepatobiliary: Hepatomegaly, greater than 20 cm craniocaudal. Mildly irregular hepatic capsule with lateral segment left liver lobe enlargement. No focal liver lesion. Normal gallbladder, without biliary ductal dilatation. Pancreas: Suspicion of subtle peripancreatic edema, including adjacent the head on image 52/series 8. Spleen: Normal in size, without focal abnormality. Adrenals/Urinary Tract: Normal adrenal glands. Normal kidneys, without hydronephrosis. Normal urinary bladder. Stomach/Bowel: Normal stomach, without wall thickening. Normal colon, appendix, and terminal ileum. Normal small bowel. Vascular/Lymphatic: Normal caliber of the aorta and branch vessels. Multiple small  retroperitoneal nodes are again identified. None are pathologic by size criteria. Porta hepatis nodes including at up to 1.5 cm on image 44/series, similar. No pelvic sidewall adenopathy. Reproductive: Normal prostate. Other: No significant free fluid. Fat containing periumbilical hernia is unchanged, without evidence of strangulation. Musculoskeletal: No acute osseous abnormality. IMPRESSION: 1. Suspicion of mild pancreatitis, especially given elevated lipase. No acute complication. 2.  Hepatomegaly. Possible mild cirrhosis. Correlate with risk factors. 3. Prominent abdominal nodes are similar and favored to be reactive. Electronically Signed   By: Abigail Miyamoto M.D.   On: 07/06/2016 14:27    EKG: Orders placed or performed during the hospital encounter of 07/06/16  . ED EKG within 10 minutes  . ED EKG within 10 minutes    IMPRESSION AND PLAN: Patient is a 40 year old African-American male presenting with chest pain and abdominal pain  1. Abdominal pain due to acute pancreatitis At this point I will keep patient nothing by mouth, I do not see any medications that would cause acute pancreatitis but we can have pharmacy review the medication list. I will check a fasting lipid panel in the a.m. Patient denies alcohol use.  Shows No Evidence of Gallstones  2. Chest pain with history of nonischemic cardiomyopathy reports that he had his cardiac catheter a few years ago which was negative I will cycle his troponins ask cardiology to see patient was started on nitroglycerin ED supposedly has an allergy to it but he is able to tolerate it  3. Diabetes type 2 I will place patient on sliding scale insulin, hold his metformin, decrease the dose of his insulin  4. Chronic systolic CHF I will continue diuretics He'll be on IV fluids cautiously  5. Sleep apnea continue CPAP at bedtime  6. Misc: lovenox for dvt proph       All the records are reviewed and case discussed with ED provider. Management plans discussed with the patient, family and they are in agreement.  CODE STATUS: Code Status History    Date Active Date Inactive Code Status Order ID Comments User Context   11/22/2015  7:53 AM 11/23/2015 12:12 PM Full Code [id number]  Harrie Foreman, MD Inpatient   07/09/2015 12:28 PM 07/10/2015  7:34 PM Full Code [id number]  Fritzi Mandes, MD Inpatient   03/30/2015  4:57 PM 03/31/2015  8:18 PM Full Code [id number]  Marzetta Board, MD Inpatient   12/11/2014  5:20 PM 12/13/2014  4:18  PM Full Code [id number]  Lorretta Harp, MD Inpatient   12/09/2014 11:15 PM 12/11/2014  5:20 PM Full Code [id number]  Jules Husbands, MD Inpatient       TOTAL TIME TAKING CARE OF THIS PATIENT: 55 minutes.    Dustin Flock M.D on 07/06/2016 at 4:08 PM  Between 7am to 6pm - Pager - [phone]  After 6pm go to [email or web address] - password [email or web address] [facility]  Office  [phone]  CC: Primary care physician; Yolonda Kida, MD

## 2016-07-06 NOTE — ED Provider Notes (Signed)
Time Seen: Approximately 1229  I have reviewed the triage notes  Chief Complaint: Abdominal Pain and Chest Pain   History of Present Illness: Oscar [name] is a 40 y.o. male who presents with epigastric abdominal pain and substernal chest discomfort. He states he's had epigastric pain now for the last 2 days and then the chest pain started last evening and points to the center of his chest without radiation to the arm or jaw area. Patient does have a history of coronary artery disease and has previous ICD placement. He is without syncope and nausea without vomiting. He denies any shortness of breath or diaphoresis. He denies any fever or chills or productive cough or wheezing.   Past Medical History:  Diagnosis Date  . Asthma   . Cardiomyopathy ([place])   . CHF (congestive heart failure) ([place])   . Coronary artery disease   . Deafness in right ear   . Diabetes mellitus without complication ([place])   . Dysrhythmia    svt  . 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 GERD (gastroesophageal reflux disease)   . Hyperlipidemia   . Hypertension   . Hypoxemia   . Myocardial infarction    2012,2014,2/16  . Sleep apnea    osa    Patient Active Problem List   Diagnosis Date Noted  . Bronchitis 05/20/2016  . Acute on chronic systolic (congestive) heart failure 04/15/2016  . Constipation 11/08/2015  . Abdominal pain 11/02/2015  . Obstructive sleep apnea 07/31/2015  . Tobacco use 07/31/2015  . Diabetes ([place]) 07/10/2015  . Chronic systolic heart failure ([place]) 03/30/2015  . Cardiac pacemaker 03/30/2015  . Chronic respiratory failure with hypercapnia ([place]) 12/12/2014  . Elevated troponin   . Non-ischemic cardiomyopathy ([place])   . Hypoxemia   . SVT (supraventricular tachycardia) ([place]) 12/09/2014  . NSTEMI (non-ST elevated myocardial infarction) ([place]) 12/09/2014  . Hypertension 12/09/2014    Past Surgical History:  Procedure Laterality Date  . CARDIAC CATHETERIZATION  12/11/2014    Procedure: RIGHT/LEFT HEART CATH AND CORONARY ANGIOGRAPHY;  Surgeon: Lorretta Harp, MD;  Location: [facility] CATH LAB;  Service: Cardiovascular;;  . ICD LEAD REMOVAL N/A 03/30/2015   Procedure: ICD LEAD REMOVAL;  Surgeon: Marzetta Board, MD;  Location: [facility] ORS;  Service: Cardiovascular;  Laterality: N/A;  . IMPLANTABLE CARDIOVERTER DEFIBRILLATOR IMPLANT    . INSERT / REPLACE / REMOVE PACEMAKER    . LEFT HEART CATHETERIZATION WITH CORONARY ANGIOGRAM N/A 12/09/2014   Procedure: LEFT HEART CATHETERIZATION WITH CORONARY ANGIOGRAM;  Surgeon: Burnell Blanks, MD;  Location: [facility] CATH LAB;  Service: Cardiovascular;  Laterality: N/A;    Past Surgical History:  Procedure Laterality Date  . CARDIAC CATHETERIZATION  12/11/2014   Procedure: RIGHT/LEFT HEART CATH AND CORONARY ANGIOGRAPHY;  Surgeon: Lorretta Harp, MD;  Location: [facility] CATH LAB;  Service: Cardiovascular;;  . ICD LEAD REMOVAL N/A 03/30/2015   Procedure: ICD LEAD REMOVAL;  Surgeon: Marzetta Board, MD;  Location: [facility] ORS;  Service: Cardiovascular;  Laterality: N/A;  . IMPLANTABLE CARDIOVERTER DEFIBRILLATOR IMPLANT    . INSERT / REPLACE / REMOVE PACEMAKER    . LEFT HEART CATHETERIZATION WITH CORONARY ANGIOGRAM N/A 12/09/2014   Procedure: LEFT HEART CATHETERIZATION WITH CORONARY ANGIOGRAM;  Surgeon: Burnell Blanks, MD;  Location: [facility] CATH LAB;  Service: Cardiovascular;  Laterality: N/A;    Current Outpatient Rx  . Order #: [id number] Class: Print  . Order #: [id number] Class: Print  . Order #: [id number] Class: Historical Med  .  Order #: [id number] Class: Normal  . Order #: [id number] Class: Print  . Order #: [id number] Class: Historical Med  . Order #: [id number] Class: Normal  . Order #: [id number] Class: Normal  . Order #: [id number] Class: Normal  . Order #: [id number] Class: Print  . Order #: [id number] Class: Normal  . Order #: [id number] Class: Normal    Allergies:  Bidil [isosorb dinitrate-hydralazine]  Family History: Family History   Problem Relation Age of Onset  . Hypertension Mother   . Congestive Heart Failure Mother   . Hypertension Sister   . Diabetes Sister   . Pancreatitis Sister   . COPD Sister   . Pancreatitis Brother   . Anemia Neg Hx   . Arrhythmia Neg Hx   . Asthma Neg Hx   . Clotting disorder Neg Hx   . Fainting Neg Hx   . Heart attack Neg Hx   . Heart disease Neg Hx   . Heart failure Neg Hx   . Hyperlipidemia Neg Hx     Social History: Social History  Substance Use Topics  . Smoking status: Current Every Day Smoker    Packs/day: 0.50    Years: 24.00    Types: Cigarettes  . Smokeless tobacco: Never Used  . Alcohol use No     Review of Systems:   10 point review of systems was performed and was otherwise negative:  Constitutional: No fever Eyes: No visual disturbances ENT: No sore throat, ear pain Cardiac: Chest pain substernal without radiation to the arm job back or flank area Respiratory: No shortness of breath, wheezing, or stridor Abdomen: Patient points to the epigastric region without any complaints of lower abdominal discomfort. Endocrine: No weight loss, No night sweats Extremities: No peripheral edema, cyanosis Skin: No rashes, easy bruising Neurologic: No focal weakness, trouble with speech or swollowing Urologic: No dysuria, Hematuria, or urinary frequency   Physical Exam:  ED Triage Vitals  Enc Vitals Group     BP 07/06/16 1156 (!) 151/98     Pulse Rate 07/06/16 1156 90     Resp 07/06/16 1156 20     Temp 07/06/16 1156 97.8 F (36.6 C)     Temp Source 07/06/16 1156 Oral     SpO2 07/06/16 1156 93 %     Weight 07/06/16 1154 (!) 334 lb (151.5 kg)     Height 07/06/16 1154 5\' 4"  (1.626 m)     Head Circumference --      Peak Flow --      Pain Score 07/06/16 1155 6     Pain Loc --      Pain Edu? --      Excl. in [place]? --     General: Awake , Alert , and Oriented times 3; GCS 15 Head: Normal cephalic , atraumatic Eyes: Pupils equal , round, reactive to  light Nose/Throat: No nasal drainage, patent upper airway without erythema or exudate.  Neck: Supple, Full range of motion, No anterior adenopathy or palpable thyroid masses Lungs: Clear to ascultation without wheezes , rhonchi, or rales Heart: Regular rate, regular rhythm without murmurs , gallops , or rubs. Some tenderness over the ICD in the left upper chest wall region Abdomen: Epigastric abdominal pain without any focal tenderness over the right upper or lower quadrant in the abdomen. Morbid obesity sounds positive and symmetric in all 4 quadrants. No palpable masses.        Extremities: 2 plus symmetric pulses. No edema, clubbing or cyanosis Neurologic: normal ambulation, Motor symmetric without deficits, sensory intact  Skin: warm, dry, no rashes   Labs:   All laboratory work was reviewed including any pertinent negatives or positives listed below:  Labs Reviewed  BASIC METABOLIC PANEL - Abnormal; Notable for the following:       Result Value   Sodium 133 (*)    Chloride 94 (*)    Glucose, Bld 247 (*)    Calcium 8.7 (*)    All other components within normal limits  TROPONIN I - Abnormal; Notable for the following:    Troponin I 0.10 (*)    All other components within normal limits  LIPASE, BLOOD - Abnormal; Notable for the following:    Lipase 515 (*)    All other components within normal limits  HEPATIC FUNCTION PANEL - Abnormal; Notable for the following:    Total Protein 8.2 (*)    Alkaline Phosphatase 171 (*)    All other components within normal limits  BRAIN NATRIURETIC PEPTIDE - Abnormal; Notable for the following:    B Natriuretic Peptide 136.0 (*)    All other components within normal limits  CBC   The patient's troponins are chronically elevated and it did not appear to be any significant change. EKG:  ED ECG REPORT I, Daymon Larsen, the attending physician, personally viewed and interpreted this ECG.  Date: 07/06/2016 EKG Time: 1156 Rate: 86 Rhythm:  normal sinus rhythm QRS Axis: Left axis deviation Intervals: normal ST/T Wave abnormalities: He has some peak T waves without any ST elevation or depression  Conduction Disturbances: none Narrative Interpretation: No acute ischemic changes    Radiology:  "Dg Chest 2 View  Result Date: 07/06/2016 CLINICAL DATA:  Chest pain EXAM: CHEST  2 VIEW COMPARISON:  03/10/2016 chest radiograph. FINDINGS: Left subclavian 2 lead ICD is stable in configuration. Stable cardiomediastinal silhouette with mild cardiomegaly. No pneumothorax. No pleural effusion. Lungs appear clear, with no acute consolidative airspace disease and no pulmonary edema. IMPRESSION: Stable mild cardiomegaly without pulmonary edema. No active pulmonary disease. Electronically Signed   By: Ilona Sorrel M.D.   On: 07/06/2016 12:25   Ct Abdomen Pelvis W Contrast  Result Date: 07/06/2016 CLINICAL DATA:  Epigastric pain for 3 days with nausea and vomiting. Elevated lipase. EXAM: CT ABDOMEN AND PELVIS WITH CONTRAST TECHNIQUE: Multidetector CT imaging of the abdomen and pelvis was performed using the standard protocol following bolus administration of intravenous contrast. CONTRAST:  177mL ISOVUE-300 IOPAMIDOL (ISOVUE-300) INJECTION 61% COMPARISON:  11/07/2015 FINDINGS: Lower chest: Clear lung bases. Incompletely imaged pacer. Mild cardiomegaly, without pericardial or pleural effusion. Hepatobiliary: Hepatomegaly, greater than 20 cm craniocaudal. Mildly irregular hepatic capsule with lateral segment left liver lobe enlargement. No focal liver lesion. Normal gallbladder, without biliary ductal dilatation. Pancreas: Suspicion of subtle peripancreatic edema, including adjacent the head on image 52/series 8. Spleen: Normal in size, without focal abnormality. Adrenals/Urinary Tract: Normal adrenal glands. Normal kidneys, without hydronephrosis. Normal urinary bladder. Stomach/Bowel: Normal stomach, without wall thickening. Normal colon, appendix, and  terminal ileum. Normal small bowel. Vascular/Lymphatic: Normal caliber of the aorta and branch vessels. Multiple small retroperitoneal nodes are again identified. None are pathologic by size criteria. Porta hepatis nodes including at up to 1.5 cm on image 44/series, similar. No pelvic sidewall adenopathy. Reproductive: Normal prostate. Other: No significant free fluid. Fat containing periumbilical hernia is unchanged, without evidence of strangulation. Musculoskeletal: No acute osseous abnormality. IMPRESSION: 1. Suspicion of mild pancreatitis, especially given elevated lipase. No acute complication. 2. Hepatomegaly. Possible mild cirrhosis. Correlate with risk factors. 3. Prominent abdominal  nodes are similar and favored to be reactive. Electronically Signed   By: Abigail Miyamoto M.D.   On: 07/06/2016 14:27  "  I personally reviewed the radiologic studies     ED Course:  Patient has findings on CAT scan evaluation along with laboratory work shows his epigastric pain is most likely due to acute pancreatitis. On further questioning the patient states he has had a history of pancreatitis and s secondary to his medication, though he could not tell me which medication had created the pancreatitis. The patient had substernal chest discomfort that was relieved with nitroglycerin. He does have a history of coronary artery disease and would be certainly unusual to have acute pancreatitis exacerbation, and a o acute coronary syndrome. The patient's otherwise hemodynamically stable was hypertensive here in emergency department and was given additional pain medication. Clinical Course      Assessment: * Acute unspecified chest pain History of coronary artery disease Chronically elevated troponin History of nonischemic cardiomyopathy and congestive heart failure Long-term hypertension Acute pancreatitis   Final Clinical Impression:   Final diagnoses:  Chest pain, unspecified type  Drug-induced acute  pancreatitis without infection or necrosis     Plan:  Inpatient            Daymon Larsen, MD 07/06/16 1610

## 2016-07-07 LAB — URINALYSIS COMPLETE WITH MICROSCOPIC (ARMC ONLY)
Bilirubin Urine: NEGATIVE
GLUCOSE, UA: NEGATIVE mg/dL
Ketones, ur: NEGATIVE mg/dL
Nitrite: NEGATIVE
PROTEIN: 100 mg/dL — AB
SPECIFIC GRAVITY, URINE: 1.005 (ref 1.005–1.030)
pH: 5 (ref 5.0–8.0)

## 2016-07-07 LAB — CBC
HEMATOCRIT: 47.4 % (ref 40.0–52.0)
Hemoglobin: 16.6 g/dL (ref 13.0–18.0)
MCH: 33 pg (ref 26.0–34.0)
MCHC: 35.1 g/dL (ref 32.0–36.0)
MCV: 94.2 fL (ref 80.0–100.0)
PLATELETS: 138 10*3/uL — AB (ref 150–440)
RBC: 5.04 MIL/uL (ref 4.40–5.90)
RDW: 13.6 % (ref 11.5–14.5)
WBC: 5.4 10*3/uL (ref 3.8–10.6)

## 2016-07-07 LAB — BASIC METABOLIC PANEL
ANION GAP: 7 (ref 5–15)
BUN: 16 mg/dL (ref 6–20)
CALCIUM: 8.4 mg/dL — AB (ref 8.9–10.3)
CO2: 31 mmol/L (ref 22–32)
CREATININE: 0.85 mg/dL (ref 0.61–1.24)
Chloride: 97 mmol/L — ABNORMAL LOW (ref 101–111)
Glucose, Bld: 157 mg/dL — ABNORMAL HIGH (ref 65–99)
Potassium: 4 mmol/L (ref 3.5–5.1)
SODIUM: 135 mmol/L (ref 135–145)

## 2016-07-07 LAB — GLUCOSE, CAPILLARY
GLUCOSE-CAPILLARY: 174 mg/dL — AB (ref 65–99)
GLUCOSE-CAPILLARY: 207 mg/dL — AB (ref 65–99)

## 2016-07-07 LAB — LIPID PANEL
CHOL/HDL RATIO: 7.5 ratio
Cholesterol: 234 mg/dL — ABNORMAL HIGH (ref 0–200)
HDL: 31 mg/dL — AB (ref >40)
LDL Cholesterol: 179 mg/dL — ABNORMAL HIGH (ref 0–99)
TRIGLYCERIDES: 121 mg/dL (ref <150)
VLDL: 24 mg/dL (ref 0–40)

## 2016-07-07 LAB — TROPONIN I: Troponin I: 0.08 ng/mL (ref <0.03)

## 2016-07-07 NOTE — Progress Notes (Signed)
Discharge instructions explained to pt/ verbalized an understanding/ iv and tele removed/ will transport off unit when ride arrives.  

## 2016-07-07 NOTE — Discharge Summary (Signed)
[facility] [place] NAME: Oscar [name]    MR#:  [id number]  DATE OF BIRTH:  Apr 04, 1976  DATE OF ADMISSION:  07/06/2016 ADMITTING PHYSICIAN: Dustin Flock, MD  DATE OF DISCHARGE: 07/07/16  PRIMARY CARE PHYSICIAN: Yolonda Kida, MD    ADMISSION DIAGNOSIS:  Drug-induced acute pancreatitis without infection or necrosis [K85.30] Chest pain, unspecified type [R07.9]  DISCHARGE DIAGNOSIS:  Acute pancreatitis of unclear etiology  SECONDARY DIAGNOSIS:   Past Medical History:  Diagnosis Date  . Asthma   . Cardiomyopathy ([place])   . CHF (congestive heart failure) ([place])   . Coronary artery disease   . Deafness in right ear   . Diabetes mellitus without complication ([place])   . Dysrhythmia    svt  . 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 GERD (gastroesophageal reflux disease)   . Hyperlipidemia   . Hypertension   . Hypoxemia   . Myocardial infarction    2012,2014,2/16  . Pancreatitis   . Sleep apnea    osa    HOSPITAL COURSE:  40 year old African-American male presenting with chest pain and abdominal pain  1. Abdominal pain due to acute pancreatitis Lipid panel ok -denies alcohol use -etiology unclear Patient denies alcohol use.  -US Shows No Evidence of Gallstones -no abd pain. Pt wants to eat food-soft diet  2. Chest pain with history of nonischemic cardiomyopathy reports that he had his cardiac catheterization a few years ago which was negative Pt asymptomatic Seen by cardiology appreciate input from Dr Ubaldo Glassing  3. Diabetes type 2 Resume home meds  4. Chronic systolic CHF -will continue diuretics -d/c IVF  5. Sleep apnea continue CPAP at bedtime  6. Misc: lovenox for dvt proph  Overall ok Will check UA D/c home later  CONSULTS OBTAINED:  Treatment Team:  Teodoro Spray, MD  DRUG ALLERGIES:   Allergies  Allergen Reactions  . Bidil [Isosorb Dinitrate-Hydralazine] Other (See Comments)     Migraine Headache    DISCHARGE MEDICATIONS:   Current Discharge Medication List    CONTINUE these medications which have NOT CHANGED   Details  aspirin EC 325 MG tablet Take 1 tablet (325 mg total) by mouth daily. Qty: 30 tablet, Refills: 0    insulin glargine (LANTUS) 100 UNIT/ML injection Inject 0.1 mLs (10 Units total) into the skin at bedtime. Qty: 4 vial, Refills: 3    metFORMIN (GLUCOPHAGE) 1000 MG tablet Take 1,000 mg by mouth 2 (two) times daily with a meal.    metolazone (ZAROXOLYN) 5 MG tablet Take 1 tablet (5 mg total) by mouth daily. Qty: 3 tablet, Refills: 0    omeprazole (PRILOSEC) 20 MG capsule Take 1 capsule (20 mg total) by mouth daily. Qty: 30 capsule, Refills: 0   Associated Diagnoses: Gastroesophageal reflux disease, esophagitis presence not specified    polyethylene glycol (MIRALAX / GLYCOLAX) packet Take 17 g by mouth daily.    potassium chloride SA (K-DUR,KLOR-CON) 20 MEQ tablet Take 1 tablet (20 mEq total) by mouth 2 (two) times daily. Qty: 60 tablet, Refills: 0    sacubitril-valsartan (ENTRESTO) 24-26 MG Take 1 tablet by mouth 2 (two) times daily. Qty: 180 tablet, Refills: 3    torsemide (DEMADEX) 20 MG tablet Take 3 tablets (60 mg total) by mouth 2 (two) times daily. Qty: 180 tablet, Refills: 3    insulin starter kit- syringes MISC 1 kit by Other route once. Qty: 1 kit, Refills: 0  spironolactone (ALDACTONE) 25 MG tablet Take 1 tablet (25 mg total) by mouth daily. Qty: 30 tablet, Refills: 3    Syringe, Disposable, 1 ML MISC 0.1 mLs by Does not apply route as directed. Qty: 60 each, Refills: 11        If you experience worsening of your admission symptoms, develop shortness of breath, life threatening emergency, suicidal or homicidal thoughts you must seek medical attention immediately by calling 911 or calling your MD immediately  if symptoms less severe.  You Must read complete instructions/literature along with all the possible  adverse reactions/side effects for all the Medicines you take and that have been prescribed to you. Take any new Medicines after you have completely understood and accept all the possible adverse reactions/side effects.   Please note  You were cared for by a hospitalist during your hospital stay. If you have any questions about your discharge medications or the care you received while you were in the hospital after you are discharged, you can call the unit and asked to speak with the hospitalist on call if the hospitalist that took care of you is not available. Once you are discharged, your primary care physician will handle any further medical issues. Please note that NO REFILLS for any discharge medications will be authorized once you are discharged, as it is imperative that you return to your primary care physician (or establish a relationship with a primary care physician if you do not have one) for your aftercare needs so that they can reassess your need for medications and monitor your lab values. Today   SUBJECTIVE  I am doing well. I want to eat food No abdominal pain  VITAL SIGNS:  Blood pressure 106/77, pulse 92, temperature 98 F (36.7 C), temperature source Oral, resp. rate 18, height 5' 3"  (1.6 m), weight (!) 154.3 kg (340 lb 3.2 oz), SpO2 90 %.  I/O:   Intake/Output Summary (Last 24 hours) at 07/07/16 1315 Last data filed at 07/07/16 0641  Gross per 24 hour  Intake              555 ml  Output                0 ml  Net              555 ml    PHYSICAL EXAMINATION:  GENERAL:  40 y.o.-year-old patient lying in the bed with no acute distress. obese  EYES: Pupils equal, round, reactive to light and accommodation. No scleral icterus. Extraocular muscles intact.  HEENT: Head atraumatic, normocephalic. Oropharynx and nasopharynx clear.  NECK: Supple, no jugular venous distention. No thyroid enlargement, no tenderness.  LUNGS: Normal breath sounds bilaterally, no wheezing,  rales,rhonchi or crepitation. No use of accessory muscles of respiration.  CARDIOVASCULAR: S1, S2 normal. No murmurs, rubs, or gallops.  ABDOMEN: Soft, non-tender, non-distended. Bowel sounds present. No organomegaly or mass.  EXTREMITIES: No pedal edema, cyanosis, or clubbing.  NEUROLOGIC: Cranial nerves II through XII are intact. Muscle strength 5/5 in all extremities. Sensation intact. Gait not checked.  PSYCHIATRIC:  patient is alert and oriented x 3.  SKIN: No obvious rash, lesion, or ulcer.   DATA REVIEW:   CBC   Recent Labs Lab 07/07/16 0142  WBC 5.4  HGB 16.6  HCT 47.4  PLT 138*    Chemistries   Recent Labs Lab 07/06/16 1158 07/07/16 0142  NA 133* 135  K 4.1 4.0  CL 94* 97*  CO2 31 31  GLUCOSE 247* 157*  BUN 17 16  CREATININE 0.93 0.85  CALCIUM 8.7* 8.4*  AST 27  --   ALT 34  --   ALKPHOS 171*  --   BILITOT 0.7  --     Microbiology Results   No results found for this or any previous visit (from the past 240 hour(s)).  RADIOLOGY:  Dg Chest 2 View  Result Date: 07/06/2016 CLINICAL DATA:  Chest pain EXAM: CHEST  2 VIEW COMPARISON:  03/10/2016 chest radiograph. FINDINGS: Left subclavian 2 lead ICD is stable in configuration. Stable cardiomediastinal silhouette with mild cardiomegaly. No pneumothorax. No pleural effusion. Lungs appear clear, with no acute consolidative airspace disease and no pulmonary edema. IMPRESSION: Stable mild cardiomegaly without pulmonary edema. No active pulmonary disease. Electronically Signed   By: Ilona Sorrel M.D.   On: 07/06/2016 12:25   Ct Abdomen Pelvis W Contrast  Result Date: 07/06/2016 CLINICAL DATA:  Epigastric pain for 3 days with nausea and vomiting. Elevated lipase. EXAM: CT ABDOMEN AND PELVIS WITH CONTRAST TECHNIQUE: Multidetector CT imaging of the abdomen and pelvis was performed using the standard protocol following bolus administration of intravenous contrast. CONTRAST:  117m ISOVUE-300 IOPAMIDOL (ISOVUE-300)  INJECTION 61% COMPARISON:  11/07/2015 FINDINGS: Lower chest: Clear lung bases. Incompletely imaged pacer. Mild cardiomegaly, without pericardial or pleural effusion. Hepatobiliary: Hepatomegaly, greater than 20 cm craniocaudal. Mildly irregular hepatic capsule with lateral segment left liver lobe enlargement. No focal liver lesion. Normal gallbladder, without biliary ductal dilatation. Pancreas: Suspicion of subtle peripancreatic edema, including adjacent the head on image 52/series 8. Spleen: Normal in size, without focal abnormality. Adrenals/Urinary Tract: Normal adrenal glands. Normal kidneys, without hydronephrosis. Normal urinary bladder. Stomach/Bowel: Normal stomach, without wall thickening. Normal colon, appendix, and terminal ileum. Normal small bowel. Vascular/Lymphatic: Normal caliber of the aorta and branch vessels. Multiple small retroperitoneal nodes are again identified. None are pathologic by size criteria. Porta hepatis nodes including at up to 1.5 cm on image 44/series, similar. No pelvic sidewall adenopathy. Reproductive: Normal prostate. Other: No significant free fluid. Fat containing periumbilical hernia is unchanged, without evidence of strangulation. Musculoskeletal: No acute osseous abnormality. IMPRESSION: 1. Suspicion of mild pancreatitis, especially given elevated lipase. No acute complication. 2. Hepatomegaly. Possible mild cirrhosis. Correlate with risk factors. 3. Prominent abdominal nodes are similar and favored to be reactive. Electronically Signed   By: KAbigail MiyamotoM.D.   On: 07/06/2016 14:27     Management plans discussed with the patient, family and they are in agreement.  CODE STATUS:     Code Status Orders        Start     Ordered   07/06/16 1735  Full code  Continuous     07/06/16 1735    Code Status History    Date Active Date Inactive Code Status Order ID Comments User Context   11/22/2015  7:53 AM 11/23/2015 12:12 PM Full Code [id number] MHarrie Foreman  MD Inpatient   07/09/2015 12:28 PM 07/10/2015  7:34 PM Full Code [id number] SFritzi Mandes MD Inpatient   03/30/2015  4:57 PM 03/31/2015  8:18 PM Full Code [id number] KMarzetta Board MD Inpatient   12/11/2014  5:20 PM 12/13/2014  4:18 PM Full Code [id number] JLorretta Harp MD Inpatient   12/09/2014 11:15 PM 12/11/2014  5:20 PM Full Code [id number] JJules Husbands MD Inpatient      TOTAL TIME TAKING CARE OF THIS PATIENT: 40 minutes.    Janeth Terry M.D on 07/07/2016 at 1:15 PM  Between 7am to 6pm - Pager - [phone] After 6pm go to [email or web address] - password [email or web address] [facility]  Office  [phone]  CC: Primary care physician; Yolonda Kida, MD

## 2016-07-07 NOTE — Consult Note (Signed)
Oscar [name]  CARDIOLOGY CONSULT NOTE  Patient ID: Oscar [name] MRN: [id number] DOB/AGE: 10/06/1975 40 y.o.  Admit date: 07/06/2016 Referring Physician Dr. Posey Pronto Primary Physician Dr. Ginette Pitman Primary Cardiologist Dr. [name] Reason for Consultation chest pain  HPI: Patient is an 40 year old male with history of idiopathic cardiomyopathy with left cardiac catheterization proximal and one year ago showing normal coronary arteries with ejection fraction of 25%. He was admitted that time with SVT. He was felt to have a non-ST elevation myocardial infarction. Patient presented to [facility] with complaints of chest pain. He had a mild troponin elevation of 0.12. Electrocardiogram revealed sinus rhythm at a rate of 86. Interventricular conduction delay no ischemic changes. Patient complained of midsternal and upper abdominal chest pain. Chest x-ray revealed stable mild cardiomegaly without pulmonary edema. No active pulmonary disease. Lipase was markedly elevated at 515 consistent with probable pancreatitis. Alk phosphatase was 171. He was admitted with diagnosis tightness. Pain has improved overnight. He denies shortness of breath. He continues to smoke cigarettes. Patient has an ICD in place for primary prevention.  Review of Systems  Constitutional: Negative.   HENT: Negative.   Eyes: Negative.   Respiratory: Negative.   Cardiovascular: Positive for chest pain.  Gastrointestinal: Positive for abdominal pain.  Genitourinary: Negative.   Musculoskeletal: Negative.   Skin: Negative.   Neurological: Negative.   Endo/Heme/Allergies: Negative.   Psychiatric/Behavioral: Negative.     Past Medical History:  Diagnosis Date  . Asthma   . Cardiomyopathy ([place])   . CHF (congestive heart failure) ([place])   . Coronary artery disease   . Deafness in right ear   . Diabetes mellitus without complication ([place])   . Dysrhythmia    svt  . 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 GERD (gastroesophageal reflux disease)   . Hyperlipidemia   . Hypertension   . Hypoxemia   . Myocardial infarction    2012,2014,2/16  . Pancreatitis   . Sleep apnea    osa    Family History  Problem Relation Age of Onset  . Hypertension Mother   . Congestive Heart Failure Mother   . Hypertension Sister   . Diabetes Sister   . Pancreatitis Sister   . COPD Sister   . Pancreatitis Brother   . Anemia Neg Hx   . Arrhythmia Neg Hx   . Asthma Neg Hx   . Clotting disorder Neg Hx   . Fainting Neg Hx   . Heart attack Neg Hx   . Heart disease Neg Hx   . Heart failure Neg Hx   . Hyperlipidemia Neg Hx     Social History   Social History  . Marital status: Single    Spouse name: N/A  . Number of children: N/A  . Years of education: N/A   Occupational History  . Not on file.   Social History Main Topics  . Smoking status: Current Every Day Smoker    Packs/day: 0.50    Years: 24.00    Types: Cigarettes  . Smokeless tobacco: Never Used  . Alcohol use No  . Drug use: No  . Sexual activity: Yes   Other Topics Concern  . Not on file   Social History Narrative  . No narrative on file    Past Surgical History:  Procedure Laterality Date  . CARDIAC CATHETERIZATION  12/11/2014   Procedure: RIGHT/LEFT HEART CATH AND CORONARY ANGIOGRAPHY;  Surgeon: Lorretta Harp, MD;  Location:  [facility] CATH [facility];  Service: Cardiovascular;;  . ICD LEAD REMOVAL N/A 03/30/2015   Procedure: ICD LEAD REMOVAL;  Surgeon: Marzetta Board, MD;  Location: [facility] ORS;  Service: Cardiovascular;  Laterality: N/A;  . IMPLANTABLE CARDIOVERTER DEFIBRILLATOR IMPLANT    . INSERT / REPLACE / REMOVE PACEMAKER    . LEFT HEART CATHETERIZATION WITH CORONARY ANGIOGRAM N/A 12/09/2014   Procedure: LEFT HEART CATHETERIZATION WITH CORONARY ANGIOGRAM;  Surgeon: Burnell Blanks, MD;  Location: [facility] CATH LAB;  Service: Cardiovascular;  Laterality: N/A;     Prescriptions Prior to Admission  Medication Sig  Dispense Refill Last Dose  . aspirin EC 325 MG tablet Take 1 tablet (325 mg total) by mouth daily. 30 tablet 0 Past Week at Unknown time  . insulin glargine (LANTUS) 100 UNIT/ML injection Inject 0.1 mLs (10 Units total) into the skin at bedtime. (Patient taking differently: Inject 15 Units into the skin at bedtime. ) 4 vial 3 Past Week at Unknown time  . metFORMIN (GLUCOPHAGE) 1000 MG tablet Take 1,000 mg by mouth 2 (two) times daily with a meal.   Past Week at am  . metolazone (ZAROXOLYN) 5 MG tablet Take 1 tablet (5 mg total) by mouth daily. 3 tablet 0 Past Week at Unknown time  . omeprazole (PRILOSEC) 20 MG capsule Take 1 capsule (20 mg total) by mouth daily. 30 capsule 0 Past Week at Unknown time  . polyethylene glycol (MIRALAX / GLYCOLAX) packet Take 17 g by mouth daily.   07/05/2016 at Unknown time  . potassium chloride SA (K-DUR,KLOR-CON) 20 MEQ tablet Take 1 tablet (20 mEq total) by mouth 2 (two) times daily. 60 tablet 0 Past Week at Unknown time  . sacubitril-valsartan (ENTRESTO) 24-26 MG Take 1 tablet by mouth 2 (two) times daily. 180 tablet 3 Past Week at Unknown time  . torsemide (DEMADEX) 20 MG tablet Take 3 tablets (60 mg total) by mouth 2 (two) times daily. (Patient taking differently: Take 20 mg by mouth 3 (three) times daily. ) 180 tablet 3 Past Week at Unknown time  . insulin starter kit- syringes MISC 1 kit by Other route once. 1 kit 0 Taking  . spironolactone (ALDACTONE) 25 MG tablet Take 1 tablet (25 mg total) by mouth daily. (Patient not taking: Reported on 07/06/2016) 30 tablet 3 Not Taking at Unknown time  . Syringe, Disposable, 1 ML MISC 0.1 mLs by Does not apply route as directed. 60 each 11 Taking    Physical Exam: Blood pressure (!) 95/42, pulse 78, temperature 97.6 F (36.4 C), temperature source Oral, resp. rate 18, height 5' 3"  (1.6 m), weight (!) 154.3 kg (340 lb 3.2 oz), SpO2 96 %.   Wt Readings from Last 1 Encounters:  07/06/16 (!) 154.3 kg (340 lb 3.2 oz)      General appearance: alert and cooperative Resp: clear to auscultation bilaterally Cardio: regular rate and rhythm GI: soft, non-tender; bowel sounds normal; no masses,  no organomegaly Extremities: extremities normal, atraumatic, no cyanosis or edema Neurologic: Grossly normal  Labs:   Lab Results  Component Value Date   WBC 5.4 07/07/2016   HGB 16.6 07/07/2016   HCT 47.4 07/07/2016   MCV 94.2 07/07/2016   PLT 138 (L) 07/07/2016    Recent Labs Lab 07/06/16 1158 07/07/16 0142  NA 133* 135  K 4.1 4.0  CL 94* 97*  CO2 31 31  BUN 17 16  CREATININE 0.93 0.85  CALCIUM 8.7* 8.4*  PROT 8.2*  --   BILITOT 0.7  --  ALKPHOS 171*  --   ALT 34  --   AST 27  --   GLUCOSE 247* 157*   Lab Results  Component Value Date   XPFRHZJ 250 (H) 12/19/2012   CKMB 2.0 08/11/2014   TROPONINI 0.08 ([place]) 07/07/2016      Radiology: No acute cardiopulmonary disease with cardiomegaly. EKG: Sinus rhythm with no ischemia.  ASSESSMENT AND PLAN:  40 year old male with history of idiopathic cardiomyopathy with an ejection fraction of 25% and normal coronary arteries by cardiac catheterization and [facility] approximately one year ago. He was mated with chest pain and abdominal pain. Mild troponin elevation with markedly elevated serum lipase. Likely etiology of his symptoms his pancreatitis. Given his normal coronary arteries approximate one year ago, does not appear to be in acute coronary event. Treatment of pancreatitis, weight loss and smoking cessation is recommended. Would continue with Entresto at current dose as well as torsemide and spironolactone. Low-sodium diet. Signed: Teodoro Spray MD, [facility] 07/07/2016, 7:30 AM

## 2016-09-12 ENCOUNTER — Inpatient Hospital Stay
Admission: EM | Admit: 2016-09-12 | Discharge: 2016-09-13 | DRG: 439 | Disposition: A | Discharge status: Home Health | Payer: Medicaid Other | Attending: Internal Medicine | Admitting: Internal Medicine

## 2016-09-12 ENCOUNTER — Encounter: Payer: Self-pay | Admitting: Emergency Medicine

## 2016-09-12 ENCOUNTER — Emergency Department: Payer: Medicaid Other

## 2016-09-12 DIAGNOSIS — K853 Drug induced acute pancreatitis without necrosis or infection: Principal | ICD-10-CM | POA: Diagnosis present

## 2016-09-12 DIAGNOSIS — I25118 Atherosclerotic heart disease of native coronary artery with other forms of angina pectoris: Secondary | ICD-10-CM | POA: Diagnosis present

## 2016-09-12 DIAGNOSIS — I5022 Chronic systolic (congestive) heart failure: Secondary | ICD-10-CM | POA: Diagnosis present

## 2016-09-12 DIAGNOSIS — E785 Hyperlipidemia, unspecified: Secondary | ICD-10-CM | POA: Diagnosis present

## 2016-09-12 DIAGNOSIS — R1011 Right upper quadrant pain: Secondary | ICD-10-CM

## 2016-09-12 DIAGNOSIS — K859 Acute pancreatitis without necrosis or infection, unspecified: Secondary | ICD-10-CM | POA: Diagnosis present

## 2016-09-12 DIAGNOSIS — R110 Nausea: Secondary | ICD-10-CM | POA: Diagnosis not present

## 2016-09-12 DIAGNOSIS — Z95810 Presence of automatic (implantable) cardiac defibrillator: Secondary | ICD-10-CM

## 2016-09-12 DIAGNOSIS — J45909 Unspecified asthma, uncomplicated: Secondary | ICD-10-CM | POA: Diagnosis present

## 2016-09-12 DIAGNOSIS — T501X5A Adverse effect of loop [high-ceiling] diuretics, initial encounter: Secondary | ICD-10-CM | POA: Diagnosis present

## 2016-09-12 DIAGNOSIS — Z6841 Body Mass Index (BMI) 40.0 and over, adult: Secondary | ICD-10-CM

## 2016-09-12 DIAGNOSIS — Z8249 Family history of ischemic heart disease and other diseases of the circulatory system: Secondary | ICD-10-CM | POA: Diagnosis not present

## 2016-09-12 DIAGNOSIS — F17210 Nicotine dependence, cigarettes, uncomplicated: Secondary | ICD-10-CM | POA: Diagnosis present

## 2016-09-12 DIAGNOSIS — I110 Hypertensive heart disease with heart failure: Secondary | ICD-10-CM | POA: Diagnosis present

## 2016-09-12 DIAGNOSIS — I252 Old myocardial infarction: Secondary | ICD-10-CM | POA: Diagnosis not present

## 2016-09-12 DIAGNOSIS — E119 Type 2 diabetes mellitus without complications: Secondary | ICD-10-CM | POA: Diagnosis present

## 2016-09-12 DIAGNOSIS — I248 Other forms of acute ischemic heart disease: Secondary | ICD-10-CM | POA: Diagnosis present

## 2016-09-12 DIAGNOSIS — Z794 Long term (current) use of insulin: Secondary | ICD-10-CM

## 2016-09-12 DIAGNOSIS — G4733 Obstructive sleep apnea (adult) (pediatric): Secondary | ICD-10-CM | POA: Diagnosis present

## 2016-09-12 DIAGNOSIS — Z888 Allergy status to other drugs, medicaments and biological substances status: Secondary | ICD-10-CM | POA: Diagnosis not present

## 2016-09-12 LAB — GLUCOSE, CAPILLARY
Glucose-Capillary: 382 mg/dL — ABNORMAL HIGH (ref 65–99)
Glucose-Capillary: 345 mg/dL — ABNORMAL HIGH (ref 65–99)
Glucose-Capillary: 209 mg/dL — ABNORMAL HIGH (ref 65–99)
Glucose-Capillary: 169 mg/dL — ABNORMAL HIGH (ref 65–99)

## 2016-09-12 LAB — CBC
HEMATOCRIT: 46.1 % (ref 40.0–52.0)
HEMOGLOBIN: 16.4 g/dL (ref 13.0–18.0)
MCH: 33.9 pg (ref 26.0–34.0)
MCHC: 35.5 g/dL (ref 32.0–36.0)
MCV: 95.4 fL (ref 80.0–100.0)
Platelets: 187 10*3/uL (ref 150–440)
RBC: 4.83 MIL/uL (ref 4.40–5.90)
RDW: 13.5 % (ref 11.5–14.5)
WBC: 6.6 10*3/uL (ref 3.8–10.6)

## 2016-09-12 LAB — URINALYSIS, COMPLETE (UACMP) WITH MICROSCOPIC
BACTERIA UA: NONE SEEN
Bilirubin Urine: NEGATIVE
Glucose, UA: >=500 mg/dL — AB
KETONES UR: NEGATIVE mg/dL
Nitrite: NEGATIVE
Specific Gravity, Urine: 1.025 (ref 1.005–1.030)
pH: 5 (ref 5.0–8.0)

## 2016-09-12 LAB — COMPREHENSIVE METABOLIC PANEL
ALBUMIN: 3.7 g/dL (ref 3.5–5.0)
ALT: 27 U/L (ref 17–63)
ANION GAP: 8 (ref 5–15)
AST: 28 U/L (ref 15–41)
Alkaline Phosphatase: 167 U/L — ABNORMAL HIGH (ref 38–126)
BUN: 19 mg/dL (ref 6–20)
CHLORIDE: 96 mmol/L — AB (ref 101–111)
CO2: 31 mmol/L (ref 22–32)
Calcium: 8.5 mg/dL — ABNORMAL LOW (ref 8.9–10.3)
Creatinine, Ser: 1.08 mg/dL (ref 0.61–1.24)
GFR calc Af Amer: >60 mL/min (ref >60)
GFR calc non Af Amer: >60 mL/min (ref >60)
GLUCOSE: 409 mg/dL — AB (ref 65–99)
POTASSIUM: 3.7 mmol/L (ref 3.5–5.1)
SODIUM: 135 mmol/L (ref 135–145)
Total Bilirubin: 0.7 mg/dL (ref 0.3–1.2)
Total Protein: 8.3 g/dL — ABNORMAL HIGH (ref 6.5–8.1)

## 2016-09-12 LAB — LIPASE, BLOOD: LIPASE: 621 U/L — AB (ref 11–51)

## 2016-09-12 LAB — TROPONIN I
TROPONIN I: 0.14 ng/mL — AB (ref <0.03)
Troponin I: 0.1 ng/mL (ref <0.03)
Troponin I: 0.1 ng/mL (ref <0.03)
Troponin I: 0.09 ng/mL (ref <0.03)
Troponin I: 0.09 ng/mL (ref <0.03)

## 2016-09-12 LAB — TSH: TSH: 2.282 u[IU]/mL (ref 0.350–4.500)

## 2016-09-12 LAB — LIPID PANEL
Cholesterol: 247 mg/dL — ABNORMAL HIGH (ref 0–200)
HDL: 31 mg/dL — ABNORMAL LOW (ref >40)
LDL CALC: 177 mg/dL — AB (ref 0–99)
Total CHOL/HDL Ratio: 8 RATIO
Triglycerides: 193 mg/dL — ABNORMAL HIGH (ref <150)
VLDL: 39 mg/dL (ref 0–40)

## 2016-09-12 MED ORDER — ORAL CARE MOUTH RINSE
15.0000 mL | Freq: Two times a day (BID) | OROMUCOSAL | Status: DC
  Administered 2016-09-12 – 2016-09-13 (×3): 15 mL via OROMUCOSAL

## 2016-09-12 MED ORDER — ASPIRIN EC 325 MG PO TBEC
325.0000 mg | DELAYED_RELEASE_TABLET | Freq: Every day | ORAL | Status: DC
  Administered 2016-09-13: 325 mg via ORAL
  Filled 2016-09-12: qty 1

## 2016-09-12 MED ORDER — ENOXAPARIN SODIUM 40 MG/0.4ML ~~LOC~~ SOLN
40.0000 mg | Freq: Two times a day (BID) | SUBCUTANEOUS | Status: DC
  Administered 2016-09-12 – 2016-09-13 (×3): 40 mg via SUBCUTANEOUS
  Filled 2016-09-12 (×4): qty 0.4

## 2016-09-12 MED ORDER — MORPHINE SULFATE (PF) 4 MG/ML IV SOLN
4.0000 mg | Freq: Once | INTRAVENOUS | Status: AC
  Administered 2016-09-12: 4 mg via INTRAVENOUS
  Filled 2016-09-12: qty 1

## 2016-09-12 MED ORDER — ONDANSETRON HCL 4 MG/2ML IJ SOLN: 4.0000 mg | Freq: Four times a day (QID) | INTRAMUSCULAR | Status: DC | PRN

## 2016-09-12 MED ORDER — SPIRONOLACTONE 25 MG PO TABS
25.0000 mg | ORAL_TABLET | Freq: Every day | ORAL | Status: DC
  Administered 2016-09-12 – 2016-09-13 (×2): 25 mg via ORAL
  Filled 2016-09-12 (×2): qty 1

## 2016-09-12 MED ORDER — ACETAMINOPHEN 650 MG RE SUPP: 650.0000 mg | Freq: Four times a day (QID) | RECTAL | Status: DC | PRN

## 2016-09-12 MED ORDER — ASPIRIN 81 MG PO CHEW: 324.0000 mg | CHEWABLE_TABLET | Freq: Once | ORAL | Status: DC

## 2016-09-12 MED ORDER — HYDROMORPHONE HCL 1 MG/ML IJ SOLN
1.0000 mg | INTRAMUSCULAR | Status: DC | PRN
  Administered 2016-09-12: 1 mg via INTRAVENOUS
  Filled 2016-09-12: qty 1

## 2016-09-12 MED ORDER — ACETAMINOPHEN 325 MG PO TABS: 650.0000 mg | ORAL_TABLET | Freq: Four times a day (QID) | ORAL | Status: DC | PRN

## 2016-09-12 MED ORDER — POTASSIUM CHLORIDE CRYS ER 20 MEQ PO TBCR
20.0000 meq | EXTENDED_RELEASE_TABLET | Freq: Two times a day (BID) | ORAL | Status: DC
  Administered 2016-09-12 – 2016-09-13 (×3): 20 meq via ORAL
  Filled 2016-09-12 (×3): qty 1

## 2016-09-12 MED ORDER — DEXTROSE 5 % IV SOLN
100.0000 mg | Freq: Every day | INTRAVENOUS | Status: DC | PRN
  Filled 2016-09-12: qty 10

## 2016-09-12 MED ORDER — ONDANSETRON HCL 4 MG/2ML IJ SOLN
4.0000 mg | Freq: Once | INTRAMUSCULAR | Status: AC
  Administered 2016-09-12: 4 mg via INTRAVENOUS
  Filled 2016-09-12: qty 2

## 2016-09-12 MED ORDER — ATORVASTATIN CALCIUM 20 MG PO TABS
40.0000 mg | ORAL_TABLET | Freq: Every day | ORAL | Status: DC
  Administered 2016-09-12: 40 mg via ORAL
  Filled 2016-09-12: qty 2

## 2016-09-12 MED ORDER — INSULIN ASPART 100 UNIT/ML ~~LOC~~ SOLN
0.0000 [IU] | Freq: Three times a day (TID) | SUBCUTANEOUS | Status: DC
  Administered 2016-09-12: 7 [IU] via SUBCUTANEOUS
  Administered 2016-09-12: 20 [IU] via SUBCUTANEOUS
  Administered 2016-09-12: 15 [IU] via SUBCUTANEOUS
  Administered 2016-09-13: 11 [IU] via SUBCUTANEOUS
  Administered 2016-09-13: 7 [IU] via SUBCUTANEOUS
  Filled 2016-09-12: qty 11
  Filled 2016-09-12: qty 7
  Filled 2016-09-12: qty 20
  Filled 2016-09-12: qty 7

## 2016-09-12 MED ORDER — TORSEMIDE 20 MG PO TABS
60.0000 mg | ORAL_TABLET | Freq: Once | ORAL | Status: DC
Start: 2016-09-13 — End: 2016-09-12

## 2016-09-12 MED ORDER — PANTOPRAZOLE SODIUM 40 MG PO TBEC
40.0000 mg | DELAYED_RELEASE_TABLET | Freq: Every day | ORAL | Status: DC
  Administered 2016-09-12 – 2016-09-13 (×2): 40 mg via ORAL
  Filled 2016-09-12 (×2): qty 1

## 2016-09-12 MED ORDER — METOLAZONE 5 MG PO TABS: 5.0000 mg | ORAL_TABLET | Freq: Every day | ORAL | Status: DC

## 2016-09-12 MED ORDER — TORSEMIDE 20 MG PO TABS
60.0000 mg | ORAL_TABLET | Freq: Two times a day (BID) | ORAL | Status: DC | PRN
  Filled 2016-09-12: qty 3

## 2016-09-12 MED ORDER — SACUBITRIL-VALSARTAN 24-26 MG PO TABS
1.0000 | ORAL_TABLET | Freq: Two times a day (BID) | ORAL | Status: DC
  Administered 2016-09-12 – 2016-09-13 (×3): 1 via ORAL
  Filled 2016-09-12 (×3): qty 1

## 2016-09-12 MED ORDER — SODIUM CHLORIDE 0.9 % IV SOLN
INTRAVENOUS | Status: DC
  Administered 2016-09-12 – 2016-09-13 (×3): via INTRAVENOUS

## 2016-09-12 MED ORDER — POLYETHYLENE GLYCOL 3350 17 G PO PACK
17.0000 g | PACK | Freq: Every day | ORAL | Status: DC
  Administered 2016-09-12 – 2016-09-13 (×2): 17 g via ORAL
  Filled 2016-09-12 (×2): qty 1

## 2016-09-12 MED ORDER — INSULIN GLARGINE 100 UNIT/ML ~~LOC~~ SOLN
6.0000 [IU] | Freq: Every day | SUBCUTANEOUS | Status: DC
  Administered 2016-09-12: 6 [IU] via SUBCUTANEOUS
  Filled 2016-09-12 (×2): qty 0.06

## 2016-09-12 MED ORDER — DOCUSATE SODIUM 100 MG PO CAPS
100.0000 mg | ORAL_CAPSULE | Freq: Two times a day (BID) | ORAL | Status: DC
  Administered 2016-09-12 – 2016-09-13 (×3): 100 mg via ORAL
  Filled 2016-09-12 (×3): qty 1

## 2016-09-12 MED ORDER — ONDANSETRON HCL 4 MG PO TABS: 4.0000 mg | ORAL_TABLET | Freq: Four times a day (QID) | ORAL | Status: DC | PRN

## 2016-09-12 NOTE — ED Triage Notes (Signed)
Pt ambulatory to triage with steady gait, no distress noted. Pt co of upper epigastric pain x1 week, accompanied with N/V x2 days. Pt reports he has not been able to take medications (BP) in 2 days due to emesis.

## 2016-09-12 NOTE — Progress Notes (Signed)
Inpatient Diabetes Program Recommendations  AACE/ADA: New Consensus Statement on Inpatient Glycemic Control (2015)  Target Ranges:  Prepandial:   less than 140 mg/dL      Peak postprandial:   less than 180 mg/dL (1-2 hours)      Critically ill patients:  140 - 180 mg/dL   Lab Results  Component Value Date   GLUCAP 382 (H) 09/12/2016   HGBA1C 7.1 (H) 11/22/2015    Review of Glycemic Control  Results for Oscar, [name] (MRN [id number]) as of 09/12/2016 08:12  Ref. Range 09/12/2016 08:02  Glucose-Capillary Latest Ref Range: 65 - 99 mg/dL 382 (H)    Diabetes history: Type 2- A1C in progress Outpatient Diabetes medications: Metformin 1000mg  bid, Lantus 15 units qhs Current orders for Inpatient glycemic control: Lantus 6 units qhs, Novolog 0-20 units tid  Inpatient Diabetes Program Recommendations:   Consider adding Novolog 0-5 units qhs and increasing Lantus to 20 units qhs (0.13units/kg)   Gentry Fitz, RN, BA, [place], CDE Diabetes Coordinator Inpatient Diabetes Program  [phone] (Team Pager) [phone] ([place]) 09/12/2016 8:16 AM

## 2016-09-12 NOTE — Progress Notes (Signed)
Initial Nutrition Assessment  DOCUMENTATION CODES:   Morbid obesity  INTERVENTION:  Diet advancement per MD.  Will provide appropriate nutrition intervention as needed with diet advancement.   NUTRITION DIAGNOSIS:   Inadequate oral intake related to inability to eat as evidenced by NPO status.  GOAL:   Patient will meet greater than or equal to 90% of their needs  MONITOR:   Diet advancement, Labs, Weight trends, I & O's  REASON FOR ASSESSMENT:   Malnutrition Screening Tool    ASSESSMENT:   41 year old male with PMHx significant for HTN, GERD, CHF, CAD, MI, Cardiomyopathy, DM, HLD now admitted for recurrent pancreatitis.    Spoke with patient and family members at bedside. Patient has had poor appetite for 5 days in setting of abdominal pain, N/V. Typically has 3+ meals per day with snacks. Patient unsure of UBW but reports weight is stable.   Medications reviewed and include: Colace, Novolog sliding scale TID with  Meals, Lantus 6 units daily at bedtime, pantoprazole, Miralax, potassium chloride 20 mEq BID, NS @ 100 ml/hr.   Labs reviewed: CBG 382, Chloride 96, Lipase 621, elevated Troponin.   Nutrition-Focused physical exam completed. Findings are no fat depletion, no muscle depletion, and no edema.   Diet Order:  Diet NPO time specified Except for: Sips with Meds  Skin:  Reviewed, no issues  Last BM:  Unknown  Height:   Ht Readings from Last 1 Encounters:  09/12/16 5\' 5"  (1.651 m)    Weight:   Wt Readings from Last 1 Encounters:  09/12/16 (!) 332 lb 14.3 oz (151 kg)    Ideal Body Weight:  61.8 kg  BMI:  Body mass index is 55.4 kg/m.  Estimated Nutritional Needs:   Kcal:  2100-2400 (14-16 kcal/kg)  Protein:  124 grams (2 grams/kg IBW)  Fluid:  2.1-2.4 L/day  EDUCATION NEEDS:   No education needs identified at this time  Willey Blade, MS, RD, LDN Pager: [phone] After Hours Pager: [phone]

## 2016-09-12 NOTE — Progress Notes (Signed)
Follow-up appointment at the [facility] scheduled on September 22, 2016 at 10:00am. Of note, he did not show for his last appointment October 2017. Thank you.

## 2016-09-12 NOTE — Progress Notes (Addendum)
[place] [facility] [place] NAME: Oscar [name]    MR#:  [id number]  DATE OF BIRTH:  1976/07/17  SUBJECTIVE:  CHIEF COMPLAINT:  Patient is reporting epigastric abdominal pain. Denies any alcohol intake  REVIEW OF SYSTEMS:  CONSTITUTIONAL: No fever, fatigue or weakness.  EYES: No blurred or double vision.  EARS, NOSE, AND THROAT: No tinnitus or ear pain.  RESPIRATORY: No cough, shortness of breath, wheezing or hemoptysis.  CARDIOVASCULAR: No chest pain, orthopnea, edema.  GASTROINTESTINAL: No diarrhea . Reporting epigastric abdominal pain. Reports nausea but no vomiting GENITOURINARY: No dysuria, hematuria.  ENDOCRINE: No polyuria, nocturia,  HEMATOLOGY: No anemia, easy bruising or bleeding SKIN: No rash or lesion. MUSCULOSKELETAL: No joint pain or arthritis.   NEUROLOGIC: No tingling, numbness, weakness.  PSYCHIATRY: No anxiety or depression.   DRUG ALLERGIES:   Allergies  Allergen Reactions  . Bidil [Isosorb Dinitrate-Hydralazine] Other (See Comments)    Migraine Headache    VITALS:  Blood pressure 110/70, pulse 84, temperature 97.5 F (36.4 C), temperature source Oral, resp. rate 17, height 5\' 5"  (1.651 m), weight (!) 151 kg (332 lb 14.3 oz), SpO2 92 %.  PHYSICAL EXAMINATION:  GENERAL:  41 y.o.-year-old patient lying in the bed with no acute distress.  EYES: Pupils equal, round, reactive to light and accommodation. No scleral icterus. Extraocular muscles intact.  HEENT: Head atraumatic, normocephalic. Oropharynx and nasopharynx clear.  NECK:  Supple, no jugular venous distention. No thyroid enlargement, no tenderness.  LUNGS: Normal breath sounds bilaterally, no wheezing, rales,rhonchi or crepitation. No use of accessory muscles of respiration.  CARDIOVASCULAR: S1, S2 normal. No murmurs, rubs, or gallops.  ABDOMEN: Soft, Tender epigastric area, no rebound tenderness, nondistended. Bowel sounds present. No organomegaly or mass.   EXTREMITIES: No pedal edema, cyanosis, or clubbing.  NEUROLOGIC: Cranial nerves II through XII are intact. Muscle strength 5/5 in all extremities. Sensation intact. Gait not checked.  PSYCHIATRIC: The patient is alert and oriented x 3.  SKIN: No obvious rash, lesion, or ulcer.    LABORATORY PANEL:   CBC  Recent Labs Lab 09/12/16 0109  WBC 6.6  HGB 16.4  HCT 46.1  PLT 187   ------------------------------------------------------------------------------------------------------------------  Chemistries   Recent Labs Lab 09/12/16 0109  NA 135  K 3.7  CL 96*  CO2 31  GLUCOSE 409*  BUN 19  CREATININE 1.08  CALCIUM 8.5*  AST 28  ALT 27  ALKPHOS 167*  BILITOT 0.7   ------------------------------------------------------------------------------------------------------------------  Cardiac Enzymes  Recent Labs Lab 09/12/16 1332  TROPONINI 0.09*   ------------------------------------------------------------------------------------------------------------------  RADIOLOGY:  US Abdomen Limited Ruq  Result Date: 09/12/2016 CLINICAL DATA:  Right upper quadrant pain and nausea for 1 week history of pancreatitis. EXAM: US ABDOMEN LIMITED - RIGHT UPPER QUADRANT COMPARISON:  CT abdomen and pelvis 07/06/2016. Ultrasound abdomen 10/01/2015. FINDINGS: Gallbladder: No gallstones or wall thickening visualized. No sonographic Murphy sign noted by sonographer. Common bile duct: Diameter: 6 mm, normal Liver: Liver parenchymal echotexture is heterogeneous and increased. This may indicate cirrhosis or fatty infiltration. No focal lesions identified. IMPRESSION: No evidence of cholelithiasis or cholecystitis. Increased and coarsened liver echotexture may indicate fatty infiltration or cirrhosis. Electronically Signed   By: Lucienne Capers M.D.   On: 09/12/2016 03:32    EKG:   Orders placed or performed during the hospital encounter of 09/12/16  . ED EKG  . ED EKG  . EKG 12-Lead  . EKG  12-Lead    ASSESSMENT AND PLAN:  This is a 41 year old male admitted for recurrent pancreatitis.  1. Pancreatitis: Recurrent; could be from torsemide Abdominal ultrasound is normal  Lipid panel LDL 177 and triglycerides 193  The patient has severe congestive heart failure and takes diuretics torsemide daily which may contribute to recurrent pancreatitis. Gently hydrate and hold torsemide Follow-up urine culture and sensitivity  2. Elevated troponin: This appears chronic and is clinically related to demand ischemia  3. Congestive heart failure: Chronic; systolic. Continue Entresto. Continue metolazone . Also continue spironolactone due to class IV symptoms [place] cardiology as patient is with recurrent pancreatitis holding torsemide at this time which could be the etiology  4. Coronary artery disease: Stable; the patient has stable angina which has bothered him intermittently over the last week but this is a chronic complaint. Follow cardiac biomarkers nonetheless. Continue aspirin  5. Diabetes mellitus type 2: Continue  sliding scale insulin. Patient is nothing by mouth at this time Reports taking Lantus 20 units at home  6. DVT prophylaxis: Lovenox  7. GI prophylaxis: Pantoprazole per home regimen     All the records are reviewed and case discussed with Care Management/Social Workerr. Management plans discussed with the patient, family and they are in agreement.  CODE STATUS: FC   TOTAL TIME TAKING CARE OF THIS PATIENT: 36  minutes.   POSSIBLE D/C IN 2  DAYS, DEPENDING ON CLINICAL CONDITION.  Note: This dictation was prepared with Dragon dictation along with smaller phrase technology. Any transcriptional errors that result from this process are unintentional.   Nicholes Mango M.D on 09/12/2016 at 5:00 PM  Between 7am to 6pm - Pager - [phone] After 6pm go to [email or web address] - password [email or web address] [facility]  Office  [phone]  CC: Primary  care physician; Yolonda Kida, MD

## 2016-09-12 NOTE — H&P (Signed)
Oscar [name] is an 41 y.o. male.   Chief Complaint: Abdominal pain HPI: The patient with past medical history of coronary artery disease status post MI, CHF as well as recurrent pancreatitis presents to emergency department complaining of abdominal pain. The patient states that his abdomen has been hurting for one week now. He has nausea and occasional nonbloody nonbilious emesis. In the emergency Department laboratory evaluation revealed significant elevated lipase. The patient was made nothing by mouth and the hospital service called for further management.  Past Medical History:  Diagnosis Date  . Asthma   . Cardiomyopathy ([place])   . CHF (congestive heart failure) ([place])   . Coronary artery disease   . Deafness in right ear   . Diabetes mellitus without complication ([place])   . Dysrhythmia    svt  . 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 GERD (gastroesophageal reflux disease)   . Hyperlipidemia   . Hypertension   . Hypoxemia   . Myocardial infarction    2012,2014,2/16  . Pancreatitis   . Sleep apnea    osa    Past Surgical History:  Procedure Laterality Date  . CARDIAC CATHETERIZATION  12/11/2014   Procedure: RIGHT/LEFT HEART CATH AND CORONARY ANGIOGRAPHY;  Surgeon: Lorretta Harp, MD;  Location: [facility] CATH LAB;  Service: Cardiovascular;;  . ICD LEAD REMOVAL N/A 03/30/2015   Procedure: ICD LEAD REMOVAL;  Surgeon: Marzetta Board, MD;  Location: [facility] ORS;  Service: Cardiovascular;  Laterality: N/A;  . IMPLANTABLE CARDIOVERTER DEFIBRILLATOR IMPLANT    . INSERT / REPLACE / REMOVE PACEMAKER    . LEFT HEART CATHETERIZATION WITH CORONARY ANGIOGRAM N/A 12/09/2014   Procedure: LEFT HEART CATHETERIZATION WITH CORONARY ANGIOGRAM;  Surgeon: Burnell Blanks, MD;  Location: [facility] CATH LAB;  Service: Cardiovascular;  Laterality: N/A;    Family History  Problem Relation Age of Onset  . Hypertension Mother   . Congestive Heart Failure Mother   . Hypertension Sister   . Diabetes  Sister   . Pancreatitis Sister   . COPD Sister   . Pancreatitis Brother   . Anemia Neg Hx   . Arrhythmia Neg Hx   . Asthma Neg Hx   . Clotting disorder Neg Hx   . Fainting Neg Hx   . Heart attack Neg Hx   . Heart disease Neg Hx   . Heart failure Neg Hx   . Hyperlipidemia Neg Hx    Social History:  reports that he has been smoking Cigarettes.  He has a 12.00 pack-year smoking history. He has never used smokeless tobacco. He reports that he does not drink alcohol or use drugs.  Allergies:  Allergies  Allergen Reactions  . Bidil [Isosorb Dinitrate-Hydralazine] Other (See Comments)    Migraine Headache    Medications Prior to Admission  Medication Sig Dispense Refill  . aspirin EC 325 MG tablet Take 1 tablet (325 mg total) by mouth daily. 30 tablet 0  . insulin glargine (LANTUS) 100 UNIT/ML injection Inject 0.1 mLs (10 Units total) into the skin at bedtime. (Patient taking differently: Inject 15 Units into the skin at bedtime. ) 4 vial 3  . insulin starter kit- syringes MISC 1 kit by Other route once. 1 kit 0  . metFORMIN (GLUCOPHAGE) 1000 MG tablet Take 1,000 mg by mouth 2 (two) times daily with a meal.    . metolazone (ZAROXOLYN) 5 MG tablet Take 1 tablet (5 mg total) by mouth daily. 3 tablet 0  . omeprazole (  PRILOSEC) 20 MG capsule Take 1 capsule (20 mg total) by mouth daily. 30 capsule 0  . polyethylene glycol (MIRALAX / GLYCOLAX) packet Take 17 g by mouth daily.    . potassium chloride SA (K-DUR,KLOR-CON) 20 MEQ tablet Take 1 tablet (20 mEq total) by mouth 2 (two) times daily. 60 tablet 0  . sacubitril-valsartan (ENTRESTO) 24-26 MG Take 1 tablet by mouth 2 (two) times daily. 180 tablet 3  . spironolactone (ALDACTONE) 25 MG tablet Take 1 tablet (25 mg total) by mouth daily. (Patient not taking: Reported on 07/06/2016) 30 tablet 3  . Syringe, Disposable, 1 ML MISC 0.1 mLs by Does not apply route as directed. 60 each 11  . torsemide (DEMADEX) 20 MG tablet Take 3 tablets (60 mg total)  by mouth 2 (two) times daily. (Patient taking differently: Take 20 mg by mouth 3 (three) times daily. ) 180 tablet 3    Results for orders placed or performed during the hospital encounter of 09/12/16 (from the past 48 hour(s))  Lipase, blood     Status: Abnormal   Collection Time: 09/12/16  1:09 AM  Result Value Ref Range   Lipase 621 (H) 11 - 51 U/L    Comment: RESULT CONFIRMED BY MANUAL DILUTION.MSS  Comprehensive metabolic panel     Status: Abnormal   Collection Time: 09/12/16  1:09 AM  Result Value Ref Range   Sodium 135 135 - 145 mmol/L   Potassium 3.7 3.5 - 5.1 mmol/L   Chloride 96 (L) 101 - 111 mmol/L   CO2 31 22 - 32 mmol/L   Glucose, Bld 409 (H) 65 - 99 mg/dL   BUN 19 6 - 20 mg/dL   Creatinine, Ser 1.08 0.61 - 1.24 mg/dL   Calcium 8.5 (L) 8.9 - 10.3 mg/dL   Total Protein 8.3 (H) 6.5 - 8.1 g/dL   Albumin 3.7 3.5 - 5.0 g/dL   AST 28 15 - 41 U/L   ALT 27 17 - 63 U/L   Alkaline Phosphatase 167 (H) 38 - 126 U/L   Total Bilirubin 0.7 0.3 - 1.2 mg/dL   GFR calc non Af Amer >60 >60 mL/min   GFR calc Af Amer >60 >60 mL/min    Comment: (NOTE) The eGFR has been calculated using the CKD EPI equation. This calculation has not been validated in all clinical situations. eGFR's persistently <60 mL/min signify possible Chronic Kidney Disease.    Anion gap 8 5 - 15  CBC     Status: None   Collection Time: 09/12/16  1:09 AM  Result Value Ref Range   WBC 6.6 3.8 - 10.6 K/uL   RBC 4.83 4.40 - 5.90 MIL/uL   Hemoglobin 16.4 13.0 - 18.0 g/dL   HCT 46.1 40.0 - 52.0 %   MCV 95.4 80.0 - 100.0 fL   MCH 33.9 26.0 - 34.0 pg   MCHC 35.5 32.0 - 36.0 g/dL   RDW 13.5 11.5 - 14.5 %   Platelets 187 150 - 440 K/uL  Urinalysis, Complete w Microscopic     Status: Abnormal   Collection Time: 09/12/16  1:09 AM  Result Value Ref Range   Color, Urine YELLOW (A) YELLOW   APPearance CLEAR (A) CLEAR   Specific Gravity, Urine 1.025 1.005 - 1.030   pH 5.0 5.0 - 8.0   Glucose, UA >=500 (A) NEGATIVE  mg/dL   Hgb urine dipstick SMALL (A) NEGATIVE   Bilirubin Urine NEGATIVE NEGATIVE   Ketones, ur NEGATIVE NEGATIVE mg/dL   Protein,  ur >=300 (A) NEGATIVE mg/dL   Nitrite NEGATIVE NEGATIVE   Leukocytes, UA TRACE (A) NEGATIVE   RBC / HPF 0-5 0 - 5 RBC/hpf   WBC, UA 6-30 0 - 5 WBC/hpf   Bacteria, UA NONE SEEN NONE SEEN   Squamous Epithelial / LPF 0-5 (A) NONE SEEN   Mucous PRESENT   Troponin I     Status: Abnormal   Collection Time: 09/12/16  1:09 AM  Result Value Ref Range   Troponin I 0.14 (HH) <0.03 ng/mL    Comment: CRITICAL RESULT CALLED TO, READ BACK BY AND VERIFIED WITH LISA THOMPSON RN AT 0200 09/12/16 MSS.   Troponin I     Status: Abnormal   Collection Time: 09/12/16  6:32 AM  Result Value Ref Range   Troponin I 0.10 (HH) <0.03 ng/mL    Comment: CRITICAL VALUE NOTED. VALUE IS CONSISTENT WITH PREVIOUSLY REPORTED/CALLED VALUE JLJ   US Abdomen Limited Ruq  Result Date: 09/12/2016 CLINICAL DATA:  Right upper quadrant pain and nausea for 1 week history of pancreatitis. EXAM: US ABDOMEN LIMITED - RIGHT UPPER QUADRANT COMPARISON:  CT abdomen and pelvis 07/06/2016. Ultrasound abdomen 10/01/2015. FINDINGS: Gallbladder: No gallstones or wall thickening visualized. No sonographic Murphy sign noted by sonographer. Common bile duct: Diameter: 6 mm, normal Liver: Liver parenchymal echotexture is heterogeneous and increased. This may indicate cirrhosis or fatty infiltration. No focal lesions identified. IMPRESSION: No evidence of cholelithiasis or cholecystitis. Increased and coarsened liver echotexture may indicate fatty infiltration or cirrhosis. Electronically Signed   By: Lucienne Capers M.D.   On: 09/12/2016 03:32    Review of Systems  Constitutional: Negative for chills and fever.  HENT: Negative for sore throat and tinnitus.   Eyes: Negative for blurred vision and redness.  Respiratory: Negative for cough and shortness of breath.   Cardiovascular: Positive for chest pain (chronic;  intermittent). Negative for palpitations, orthopnea and PND.  Gastrointestinal: Positive for abdominal pain and nausea. Negative for diarrhea and vomiting.  Genitourinary: Negative for dysuria, frequency and urgency.  Musculoskeletal: Negative for joint pain and myalgias.  Skin: Negative for rash.       No lesions  Neurological: Negative for speech change, focal weakness and weakness.  Endo/Heme/Allergies: Does not bruise/bleed easily.       No temperature intolerance  Psychiatric/Behavioral: Negative for depression and suicidal ideas.    Blood pressure 105/61, pulse 80, temperature 97.5 F (36.4 C), temperature source Oral, resp. rate 18, height 5' 5"  (1.651 m), weight (!) 151 kg (332 lb 14.3 oz), SpO2 91 %. Physical Exam  Constitutional: He is oriented to person, place, and time. He appears well-developed and well-nourished. No distress.  HENT:  Head: Normocephalic and atraumatic.  Mouth/Throat: Oropharynx is clear and moist.  Eyes: Conjunctivae and EOM are normal. Pupils are equal, round, and reactive to light. No scleral icterus.  Neck: Normal range of motion. Neck supple. No JVD present. No tracheal deviation present. No thyromegaly present.  Cardiovascular: Normal rate and regular rhythm.  Exam reveals no gallop and no friction rub.   No murmur heard. Respiratory: Effort normal and breath sounds normal. No respiratory distress. He has no wheezes.  GI: Soft. Bowel sounds are normal. He exhibits distension. He exhibits no mass. There is tenderness. There is no rebound and no guarding.  Small reducible umbilical hernia  Genitourinary:  Genitourinary Comments: Deferred  Musculoskeletal: Normal range of motion. He exhibits no edema.  Lymphadenopathy:    He has no cervical adenopathy.  Neurological: He is  alert and oriented to person, place, and time. No cranial nerve deficit.  Skin: Skin is warm and dry. No rash noted. No erythema.  Psychiatric: He has a normal mood and affect. His  behavior is normal. Judgment and thought content normal.     Assessment/Plan This is a 41 year old male admitted for recurrent pancreatitis. 1. Pancreatitis: Recurrent; no signs or symptoms of sepsis. No need for emergent imaging at this time. The patient has severe congestive heart failure and takes diuretics daily which may contribute to recurrent pancreatitis. Gently hydrate and stagger resumption of diuretics. Manage pain and advance diet as tolerated. 2. Elevated troponin: This appears chronic and is clinically related to demand ischemia 3. Congestive heart failure: Chronic; systolic. Continue Entresto. Continue metolazone prior to loop diuretic. Also continue spironolactone due to class IV symptoms 4. Coronary artery disease: Stable; the patient has stable angina which has bothered him intermittently over the last week but this is a chronic complaint. Follow cardiac biomarkers nonetheless. Continue aspirin 5. Diabetes mellitus type 2: Continue basal insulin therapy as well as sliding scale insulin 6. DVT prophylaxis: Lovenox 7. GI prophylaxis: Pantoprazole per home regimen The patient is a full code. Time spent on admission orders and patient care approximately 45 minutes  Harrie Foreman, MD 09/12/2016, 7:43 AM

## 2016-09-12 NOTE — ED Notes (Signed)
Tech tranported pt to room 214. Floor called before leaving ED.

## 2016-09-12 NOTE — Progress Notes (Signed)
Inpatient Diabetes Program Recommendations  AACE/ADA: New Consensus Statement on Inpatient Glycemic Control (2015)  Target Ranges:  Prepandial:   less than 140 mg/dL      Peak postprandial:   less than 180 mg/dL (1-2 hours)      Critically ill patients:  140 - 180 mg/dL   Lab Results  Component Value Date   GLUCAP 345 (H) 09/12/2016   HGBA1C 7.1 (H) 11/22/2015    Spoke with patient at the bedside- patient reports he takes Lantus 20 units qhs.  He has not been taking Metformin because his stomach has been bothering him.    Oscar Fitz, RN, BA, MHA, CDE Diabetes Coordinator Inpatient Diabetes Program  [phone] (Team Pager) [phone] ([place]) 09/12/2016 12:40 PM

## 2016-09-12 NOTE — Progress Notes (Signed)
Anticoagulation monitoring(Lovenox):  41 yo male ordered Lovenox 40 mg Q24h  Filed Weights   09/12/16 0114  Weight: (!) 333 lb (151 kg)   BMI 55.5    Lab Results  Component Value Date   CREATININE 1.08 09/12/2016   CREATININE 0.85 07/07/2016   CREATININE 0.93 07/06/2016   Estimated Creatinine Clearance: 123.9 mL/min (by C-G formula based on SCr of 1.08 mg/dL). Hemoglobin & Hematocrit     Component Value Date/Time   HGB 16.4 09/12/2016 0109   HGB 14.3 08/22/2014 0358   HCT 46.1 09/12/2016 0109   HCT 43.1 08/22/2014 0358     Per Protocol for Patient with estCrcl < 30 ml/min and BMI > 40, will transition to Lovenox 40 mg Q12h.

## 2016-09-13 LAB — COMPREHENSIVE METABOLIC PANEL
ALBUMIN: 3.2 g/dL — AB (ref 3.5–5.0)
ALK PHOS: 140 U/L — AB (ref 38–126)
ALT: 23 U/L (ref 17–63)
AST: 22 U/L (ref 15–41)
Anion gap: 8 (ref 5–15)
BUN: 19 mg/dL (ref 6–20)
CO2: 31 mmol/L (ref 22–32)
CREATININE: 0.91 mg/dL (ref 0.61–1.24)
Calcium: 8.3 mg/dL — ABNORMAL LOW (ref 8.9–10.3)
Chloride: 98 mmol/L — ABNORMAL LOW (ref 101–111)
GFR calc Af Amer: >60 mL/min (ref >60)
GFR calc non Af Amer: >60 mL/min (ref >60)
GLUCOSE: 235 mg/dL — AB (ref 65–99)
Potassium: 4 mmol/L (ref 3.5–5.1)
SODIUM: 137 mmol/L (ref 135–145)
Total Bilirubin: 0.7 mg/dL (ref 0.3–1.2)
Total Protein: 7.2 g/dL (ref 6.5–8.1)

## 2016-09-13 LAB — GLUCOSE, CAPILLARY
Glucose-Capillary: 258 mg/dL — ABNORMAL HIGH (ref 65–99)
Glucose-Capillary: 212 mg/dL — ABNORMAL HIGH (ref 65–99)

## 2016-09-13 LAB — URINE CULTURE
Culture: NO GROWTH
Special Requests: NORMAL

## 2016-09-13 LAB — HEMOGLOBIN A1C
Hgb A1c MFr Bld: 10.9 % — ABNORMAL HIGH (ref 4.8–5.6)
Mean Plasma Glucose: 266 mg/dL

## 2016-09-13 LAB — LIPASE, BLOOD: Lipase: 58 U/L — ABNORMAL HIGH (ref 11–51)

## 2016-09-13 MED ORDER — ATORVASTATIN CALCIUM 40 MG PO TABS: 40.0000 mg | ORAL_TABLET | Freq: Every day | ORAL | 0 refills | Status: DC

## 2016-09-13 MED ORDER — OXYCODONE HCL 5 MG PO TABS: 5.0000 mg | ORAL_TABLET | Freq: Four times a day (QID) | ORAL | 0 refills | Status: DC | PRN

## 2016-09-13 MED ORDER — OXYCODONE HCL 5 MG PO TABS: 5.0000 mg | ORAL_TABLET | Freq: Four times a day (QID) | ORAL | Status: DC | PRN

## 2016-09-13 NOTE — Discharge Summary (Signed)
[facility] [place] NAME: Oscar [name]    MR#:  [id number]  [place] OF BIRTH:  1976/01/09  DATE OF ADMISSION:  09/12/2016 ADMITTING PHYSICIAN: Harrie Foreman, MD  DATE OF DISCHARGE: 09/13/16 PRIMARY CARE PHYSICIAN: Yolonda Kida, MD    ADMISSION DIAGNOSIS:  Nausea [R11.0] RUQ pain [R10.11]  DISCHARGE DIAGNOSIS:  Acute pancreatitis secondary to Lasix  SECONDARY DIAGNOSIS:   Past Medical History:  Diagnosis Date  . Asthma   . Cardiomyopathy ([place])   . CHF (congestive heart failure) ([place])   . Coronary artery disease   . Deafness in right ear   . Diabetes mellitus without complication ([place])   . Dysrhythmia    svt  . 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 GERD (gastroesophageal reflux disease)   . Hyperlipidemia   . Hypertension   . Hypoxemia   . Myocardial infarction    2012,2014,2/16  . Pancreatitis   . Sleep apnea    osa    HOSPITAL COURSE:   HPI: The patient with past medical history of coronary artery disease status post MI, CHF as well as recurrent pancreatitis presents to emergency department complaining of abdominal pain. The patient states that his abdomen has been hurting for one week now. He has nausea and occasional nonbloody nonbilious emesis. In the emergency Department laboratory evaluation revealed significant elevated lipase. The patient was made nothing by mouth and the hospital service called for further management. Review history and physical for details  Hospital course  1. Pancreatitis: Recurrent; could be from torsemide Abdominal ultrasound is normal  Lipid panel LDL 177 and triglycerides 193  The patient has severe congestive heart failure and takes diuretics torsemide daily which may contribute to recurrent pancreatitis. Plan is to discontinue torsemide and outpatient follow-up with Dr. Nehemiah Massed in 2 days Continue Demadex and Spiriva lactone  Urine culture with no growth  2.  Elevated troponin: This appears chronic and is clinically related to demand ischemia  3. Congestive heart failure: Chronic; systolic. Continue Entresto. Continue metolazone .Also continue spironolactone due to class IV symptoms Discussed with Dr. Nehemiah Massed who has recommended to discontinue torsemide at this time and follow-up with him at office in 2 days on January 15  4. Coronary artery disease: Stable; the patient has stable angina which has bothered him intermittently over the last week but this is a chronic complaint.  Continue aspirin  5. Diabetes mellitus type 2:   sliding scale insulin while NPO, patient is to resume his home dose Lantus as he is tolerating diet from today  6. DVT prophylaxis: Lovenox  7. GI prophylaxis: Pantoprazole per home regimen  DISCHARGE CONDITIONS:   FAIR  CONSULTS OBTAINED:  Treatment Team:  Jobe Igo, MD Corey Skains, MD   PROCEDURES none  DRUG ALLERGIES:   Allergies  Allergen Reactions  . Bidil [Isosorb Dinitrate-Hydralazine] Other (See Comments)    Migraine Headache    DISCHARGE MEDICATIONS:   Current Discharge Medication List    START taking these medications   Details  atorvastatin (LIPITOR) 40 MG tablet Take 1 tablet (40 mg total) by mouth daily at 6 PM. Qty: 30 tablet, Refills: 0    oxyCODONE (OXY IR/ROXICODONE) 5 MG immediate release tablet Take 1 tablet (5 mg total) by mouth every 6 (six) hours as needed for moderate pain, severe pain or breakthrough pain. Qty: 20 tablet, Refills: 0      CONTINUE these medications which have NOT CHANGED  Details  aspirin EC 325 MG tablet Take 1 tablet (325 mg total) by mouth daily. Qty: 30 tablet, Refills: 0    insulin glargine (LANTUS) 100 UNIT/ML injection Inject 0.1 mLs (10 Units total) into the skin at bedtime. Qty: 4 vial, Refills: 3    insulin starter kit- syringes MISC 1 kit by Other route once. Qty: 1 kit, Refills: 0    metFORMIN (GLUCOPHAGE) 1000 MG tablet Take  1,000 mg by mouth 2 (two) times daily with a meal.    metolazone (ZAROXOLYN) 5 MG tablet Take 1 tablet (5 mg total) by mouth daily. Qty: 3 tablet, Refills: 0    omeprazole (PRILOSEC) 20 MG capsule Take 1 capsule (20 mg total) by mouth daily. Qty: 30 capsule, Refills: 0   Associated Diagnoses: Gastroesophageal reflux disease, esophagitis presence not specified    polyethylene glycol (MIRALAX / GLYCOLAX) packet Take 17 g by mouth daily.    potassium chloride SA (K-DUR,KLOR-CON) 20 MEQ tablet Take 1 tablet (20 mEq total) by mouth 2 (two) times daily. Qty: 60 tablet, Refills: 0    sacubitril-valsartan (ENTRESTO) 24-26 MG Take 1 tablet by mouth 2 (two) times daily. Qty: 180 tablet, Refills: 3    spironolactone (ALDACTONE) 25 MG tablet Take 1 tablet (25 mg total) by mouth daily. Qty: 30 tablet, Refills: 3    Syringe, Disposable, 1 ML MISC 0.1 mLs by Does not apply route as directed. Qty: 60 each, Refills: 11      STOP taking these medications     torsemide (DEMADEX) 20 MG tablet          DISCHARGE INSTRUCTIONS:  Heart Failure Clinic appointment on September 22, 2016 at 10:00am with Darylene Price, [place]. Please call [phone] to reschedule.  Follow-up with primary care physician in a week Follow-up with cardiology Dr. Nehemiah Massed on January 15 please call office Monitor daily weights Home health    DIET:  Cardiac diet, DIABETIC  DISCHARGE CONDITION:  Fair  ACTIVITY:  Activity as tolerated  OXYGEN:  Home Oxygen: No.   Oxygen Delivery: room air  DISCHARGE LOCATION:  home   If you experience worsening of your admission symptoms, develop shortness of breath, life threatening emergency, suicidal or homicidal thoughts you must seek medical attention immediately by calling 911 or calling your MD immediately  if symptoms less severe.  You Must read complete instructions/literature along with all the possible adverse reactions/side effects for all the Medicines you take and  that have been prescribed to you. Take any new Medicines after you have completely understood and accpet all the possible adverse reactions/side effects.   Please note  You were cared for by a hospitalist during your hospital stay. If you have any questions about your discharge medications or the care you received while you were in the hospital after you are discharged, you can call the unit and asked to speak with the hospitalist on call if the hospitalist that took care of you is not available. Once you are discharged, your primary care physician will handle any further medical issues. Please note that NO REFILLS for any discharge medications will be authorized once you are discharged, as it is imperative that you return to your primary care physician (or establish a relationship with a primary care physician if you do not have one) for your aftercare needs so that they can reassess your need for medications and monitor your lab values.     Today  Chief Complaint  Patient presents with  . Abdominal Pain  Patient is a significantly improved. Tolerating clear liquid diet. Advance to soft diet which was also tolerated by the patient and wants to be discharged home. Discussed with Dr. Nehemiah Massed who has recommended to discontinue furosemide and outpatient follow-up in 2-3 days  ROS:  CONSTITUTIONAL: Denies fevers, chills. Denies any fatigue, weakness.  EYES: Denies blurry vision, double vision, eye pain. EARS, NOSE, THROAT: Denies tinnitus, ear pain, hearing loss. RESPIRATORY: Denies cough, wheeze, shortness of breath.  CARDIOVASCULAR: Denies chest pain, palpitations, edema.  GASTROINTESTINAL: Denies nausea, vomiting, diarrhea, abdominal pain. Denies bright red blood per rectum. GENITOURINARY: Denies dysuria, hematuria. ENDOCRINE: Denies nocturia or thyroid problems. HEMATOLOGIC AND LYMPHATIC: Denies easy bruising or bleeding. SKIN: Denies rash or lesion. MUSCULOSKELETAL: Denies pain in neck,  back, shoulder, knees, hips or arthritic symptoms.  NEUROLOGIC: Denies paralysis, paresthesias.  PSYCHIATRIC: Denies anxiety or depressive symptoms.   VITAL SIGNS:  Blood pressure 125/84, pulse 72, temperature 98.1 F (36.7 C), temperature source Oral, resp. rate 18, height 5' 5"  (1.651 m), weight (!) 156.9 kg (345 lb 12.8 oz), SpO2 96 %.  I/O:    Intake/Output Summary (Last 24 hours) at 09/13/16 1401 Last data filed at 09/13/16 1042  Gross per 24 hour  Intake             1671 ml  Output              850 ml  Net              821 ml    PHYSICAL EXAMINATION:  GENERAL:  40 y.o.-year-old patient lying in the bed with no acute distress.  EYES: Pupils equal, round, reactive to light and accommodation. No scleral icterus. Extraocular muscles intact.  HEENT: Head atraumatic, normocephalic. Oropharynx and nasopharynx clear.  NECK:  Supple, no jugular venous distention. No thyroid enlargement, no tenderness.  LUNGS: Normal breath sounds bilaterally, no wheezing, rales,rhonchi or crepitation. No use of accessory muscles of respiration.  CARDIOVASCULAR: S1, S2 normal. No murmurs, rubs, or gallops.  ABDOMEN: Soft, non-tender, non-distended. Bowel sounds present. No organomegaly or mass.  EXTREMITIES: No pedal edema, cyanosis, or clubbing.  NEUROLOGIC: Cranial nerves II through XII are intact. Muscle strength 5/5 in all extremities. Sensation intact. Gait not checked.  PSYCHIATRIC: The patient is alert and oriented x 3.  SKIN: No obvious rash, lesion, or ulcer.   DATA REVIEW:   CBC  Recent Labs Lab 09/12/16 0109  WBC 6.6  HGB 16.4  HCT 46.1  PLT 187    Chemistries   Recent Labs Lab 09/13/16 0604  NA 137  K 4.0  CL 98*  CO2 31  GLUCOSE 235*  BUN 19  CREATININE 0.91  CALCIUM 8.3*  AST 22  ALT 23  ALKPHOS 140*  BILITOT 0.7    Cardiac Enzymes  Recent Labs Lab 09/12/16 1913  TROPONINI 0.09*    Microbiology Results  Results for orders placed or performed during  the hospital encounter of 09/12/16  Urine culture     Status: None   Collection Time: 09/12/16  1:09 AM  Result Value Ref Range Status   Specimen Description URINE, CLEAN CATCH  Final   Special Requests Normal  Final   Culture NO GROWTH Performed at [facility]   Final   Report Status 09/13/2016 FINAL  Final    RADIOLOGY:  US Abdomen Limited Ruq  Result Date: 09/12/2016 CLINICAL DATA:  Right upper quadrant pain and nausea for 1 week history of pancreatitis. EXAM: US ABDOMEN LIMITED - RIGHT  UPPER QUADRANT COMPARISON:  CT abdomen and pelvis 07/06/2016. Ultrasound abdomen 10/01/2015. FINDINGS: Gallbladder: No gallstones or wall thickening visualized. No sonographic Murphy sign noted by sonographer. Common bile duct: Diameter: 6 mm, normal Liver: Liver parenchymal echotexture is heterogeneous and increased. This may indicate cirrhosis or fatty infiltration. No focal lesions identified. IMPRESSION: No evidence of cholelithiasis or cholecystitis. Increased and coarsened liver echotexture may indicate fatty infiltration or cirrhosis. Electronically Signed   By: Lucienne Capers M.D.   On: 09/12/2016 03:32    EKG:   Orders placed or performed during the hospital encounter of 09/12/16  . ED EKG  . ED EKG  . EKG 12-Lead  . EKG 12-Lead      Management plans discussed with the patient, family and they are in agreement.  CODE STATUS:     Code Status Orders        Start     Ordered   09/12/16 0715  Full code  Continuous     09/12/16 0714    Code Status History    Date Active Date Inactive Code Status Order ID Comments User Context   07/06/2016  5:35 PM 07/07/2016  5:49 PM Full Code [id number]  Dustin Flock, MD Inpatient   11/22/2015  7:53 AM 11/23/2015 12:12 PM Full Code [id number]  Harrie Foreman, MD Inpatient   07/09/2015 12:28 PM 07/10/2015  7:34 PM Full Code [id number]  Fritzi Mandes, MD Inpatient   03/30/2015  4:57 PM 03/31/2015  8:18 PM Full Code [id number]  Marzetta Board, MD  Inpatient   12/11/2014  5:20 PM 12/13/2014  4:18 PM Full Code [id number]  Lorretta Harp, MD Inpatient   12/09/2014 11:15 PM 12/11/2014  5:20 PM Full Code [id number]  Jules Husbands, MD Inpatient      TOTAL TIME TAKING CARE OF THIS PATIENT: 45 minutes.   Note: This dictation was prepared with Dragon dictation along with smaller phrase technology. Any transcriptional errors that result from this process are unintentional.   @[facility] @  on 09/13/2016 at 2:01 PM  Between 7am to 6pm - Pager - [phone]  After 6pm go to [email or web address] - password [email or web address] [facility]  Office  [phone]  CC: Primary care physician; Yolonda Kida, MD

## 2016-09-13 NOTE — Consult Note (Signed)
[facility] Cardiology Consultation Note  Patient ID: JHAN CONERY, MRN: [id number], DOB/AGE: [birth year] 42 y.o. Admit date: 09/12/2016   Date of Consult: 09/13/2016 Primary Physician: Yolonda Kida, MD Primary Cardiologist: [facility]  Chief Complaint:  Chief Complaint  Patient presents with  . Abdominal Pain   Reason for Consult: pancreatitis  HPI: 41 y.o. male with known apparent severe systolic dysfunction congestive heart failure status post previous ICD placement and previous cardioversion who has done fairly well on current medical regimen including an tress though Aldactone and furosemide with apparent relatively recent cardiac catheterization showing normal coronary arteries. The patient recently has had progressive issues of abdominal discomfort over the last 3-6 months. At this time the patient felt as if it was heart failure although appears that it may be for pancreatitis. He came in with abdominal discomfort to the emergency room with the elevated lipase consistent with pancreatitis possibly secondary to furosemide use. Since admission and discontinuation of furosemide the patient has felt much better at this time. He did have an EKG showing normal sinus rhythm with left axis deviation and left ventricular hypertrophy and a troponin of 0.14 consistent with demand ischemia rather than acute coronary syndrome. Currently he does not have any exacerbation of his congestive heart failure and is relatively stable  Past Medical History:  Diagnosis Date  . Asthma   . Cardiomyopathy ([place])   . CHF (congestive heart failure) ([place])   . Coronary artery disease   . Deafness in right ear   . Diabetes mellitus without complication ([place])   . Dysrhythmia    svt  . 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 GERD (gastroesophageal reflux disease)   . Hyperlipidemia   . Hypertension   . Hypoxemia   . Myocardial infarction    2012,2014,2/16  . Pancreatitis   . Sleep apnea     osa      Surgical History:  Past Surgical History:  Procedure Laterality Date  . CARDIAC CATHETERIZATION  12/11/2014   Procedure: RIGHT/LEFT HEART CATH AND CORONARY ANGIOGRAPHY;  Surgeon: Lorretta Harp, MD;  Location: [facility] CATH LAB;  Service: Cardiovascular;;  . ICD LEAD REMOVAL N/A 03/30/2015   Procedure: ICD LEAD REMOVAL;  Surgeon: Marzetta Board, MD;  Location: [facility] ORS;  Service: Cardiovascular;  Laterality: N/A;  . IMPLANTABLE CARDIOVERTER DEFIBRILLATOR IMPLANT    . INSERT / REPLACE / REMOVE PACEMAKER    . LEFT HEART CATHETERIZATION WITH CORONARY ANGIOGRAM N/A 12/09/2014   Procedure: LEFT HEART CATHETERIZATION WITH CORONARY ANGIOGRAM;  Surgeon: Burnell Blanks, MD;  Location: [facility] CATH LAB;  Service: Cardiovascular;  Laterality: N/A;     Home Meds: Prior to Admission medications   Medication Sig Start Date End Date Taking? Authorizing Provider  aspirin EC 325 MG tablet Take 1 tablet (325 mg total) by mouth daily. 07/10/15   Aldean Jewett, MD  insulin glargine (LANTUS) 100 UNIT/ML injection Inject 0.1 mLs (10 Units total) into the skin at bedtime. Patient taking differently: Inject 15 Units into the skin at bedtime.  12/07/15   Alisa Graff, FNP  insulin starter kit- syringes MISC 1 kit by Other route once. 07/10/15   Aldean Jewett, MD  metFORMIN (GLUCOPHAGE) 1000 MG tablet Take 1,000 mg by mouth 2 (two) times daily with a meal.    Historical Provider, MD  metolazone (ZAROXOLYN) 5 MG tablet Take 1 tablet (5 mg total) by mouth daily. 06/17/16 09/15/16  Alisa Graff, FNP  omeprazole (  PRILOSEC) 20 MG capsule Take 1 capsule (20 mg total) by mouth daily. 04/01/16   Larene Beach A McGowan, PA-C  polyethylene glycol (MIRALAX / GLYCOLAX) packet Take 17 g by mouth daily.    Historical Provider, MD  potassium chloride SA (K-DUR,KLOR-CON) 20 MEQ tablet Take 1 tablet (20 mEq total) by mouth 2 (two) times daily. 04/15/16   Alisa Graff, FNP  sacubitril-valsartan (ENTRESTO) 24-26 MG Take 1  tablet by mouth 2 (two) times daily. 08/21/15   Alisa Graff, FNP  spironolactone (ALDACTONE) 25 MG tablet Take 1 tablet (25 mg total) by mouth daily. Patient not taking: Reported on 07/06/2016 06/17/16 07/17/16  Alisa Graff, FNP  Syringe, Disposable, 1 ML MISC 0.1 mLs by Does not apply route as directed. 08/21/15   Alisa Graff, FNP  torsemide (DEMADEX) 20 MG tablet Take 3 tablets (60 mg total) by mouth 2 (two) times daily. Patient taking differently: Take 20 mg by mouth 3 (three) times daily.  04/15/16   Alisa Graff, FNP    Inpatient Medications:  . aspirin  324 mg Oral Once  . aspirin EC  325 mg Oral Daily  . atorvastatin  40 mg Oral q1800  . docusate sodium  100 mg Oral BID  . enoxaparin (LOVENOX) injection  40 mg Subcutaneous Q12H  . insulin aspart  0-20 Units Subcutaneous TID WC  . insulin glargine  6 Units Subcutaneous QHS  . mouth rinse  15 mL Mouth Rinse BID  . pantoprazole  40 mg Oral Daily  . polyethylene glycol  17 g Oral Daily  . potassium chloride SA  20 mEq Oral BID  . sacubitril-valsartan  1 tablet Oral BID  . spironolactone  25 mg Oral Daily   . sodium chloride 100 mL/hr at 09/12/16 2304    Allergies:  Allergies  Allergen Reactions  . Bidil [Isosorb Dinitrate-Hydralazine] Other (See Comments)    Migraine Headache    Social History   Social History  . Marital status: Single    Spouse name: N/A  . Number of children: N/A  . Years of education: N/A   Occupational History  . Not on file.   Social History Main Topics  . Smoking status: Current Every Day Smoker    Packs/day: 0.50    Years: 24.00    Types: Cigarettes  . Smokeless tobacco: Never Used  . Alcohol use No  . Drug use: No  . Sexual activity: Yes   Other Topics Concern  . Not on file   Social History Narrative  . No narrative on file     Family History  Problem Relation Age of Onset  . Hypertension Mother   . Congestive Heart Failure Mother   . Hypertension Sister   .  Diabetes Sister   . Pancreatitis Sister   . COPD Sister   . Pancreatitis Brother   . Anemia Neg Hx   . Arrhythmia Neg Hx   . Asthma Neg Hx   . Clotting disorder Neg Hx   . Fainting Neg Hx   . Heart attack Neg Hx   . Heart disease Neg Hx   . Heart failure Neg Hx   . Hyperlipidemia Neg Hx      Review of Systems Positive for Shortness of breath abdominal pain Negative for: General:  chills, fever, night sweats or weight changes.  Cardiovascular: PND orthopnea syncope dizziness  Dermatological skin lesions rashes Respiratory: Cough congestion Urologic: Frequent urination urination at night and hematuria Abdominal:  positive for  nausea, negative for vomiting, diarrhea, bright red blood per rectum, melena, or hematemesis Neurologic: negative for visual changes, and/or hearing changes  All other systems reviewed and are otherwise negative except as noted above.  Labs:  Recent Labs  09/12/16 0632 09/12/16 0716 09/12/16 1332 09/12/16 1913  TROPONINI 0.10* 0.10* 0.09* 0.09*   Lab Results  Component Value Date   WBC 6.6 09/12/2016   HGB 16.4 09/12/2016   HCT 46.1 09/12/2016   MCV 95.4 09/12/2016   PLT 187 09/12/2016    Recent Labs Lab 09/12/16 0109  NA 135  K 3.7  CL 96*  CO2 31  BUN 19  CREATININE 1.08  CALCIUM 8.5*  PROT 8.3*  BILITOT 0.7  ALKPHOS 167*  ALT 27  AST 28  GLUCOSE 409*   Lab Results  Component Value Date   CHOL 247 (H) 09/12/2016   HDL 31 (L) 09/12/2016   LDLCALC 177 (H) 09/12/2016   TRIG 193 (H) 09/12/2016   No results found for: DDIMER  Radiology/Studies:  US Abdomen Limited Ruq  Result Date: 09/12/2016 CLINICAL DATA:  Right upper quadrant pain and nausea for 1 week history of pancreatitis. EXAM: US ABDOMEN LIMITED - RIGHT UPPER QUADRANT COMPARISON:  CT abdomen and pelvis 07/06/2016. Ultrasound abdomen 10/01/2015. FINDINGS: Gallbladder: No gallstones or wall thickening visualized. No sonographic Murphy sign noted by sonographer. Common  bile duct: Diameter: 6 mm, normal Liver: Liver parenchymal echotexture is heterogeneous and increased. This may indicate cirrhosis or fatty infiltration. No focal lesions identified. IMPRESSION: No evidence of cholelithiasis or cholecystitis. Increased and coarsened liver echotexture may indicate fatty infiltration or cirrhosis. Electronically Signed   By: Lucienne Capers M.D.   On: 09/12/2016 03:32    EKG: Normal sinus rhythm with left axis deviation and left ventricular hypertrophy with non-specific ST and T-wave changes  Weights: Filed Weights   09/12/16 0114 09/12/16 0726 09/13/16 0625  Weight: (!) 151 kg (333 lb) (!) 151 kg (332 lb 14.3 oz) (!) 156.9 kg (345 lb 12.8 oz)     Physical Exam: Blood pressure 133/80, pulse 79, temperature 97.5 F (36.4 C), resp. rate 20, height 5' 5"  (1.651 m), weight (!) 156.9 kg (345 lb 12.8 oz), SpO2 95 %. Body mass index is 57.54 kg/m. General: Well developed, well nourished, in no acute distress. Head eyes ears nose throat: Normocephalic, atraumatic, sclera non-icteric, no xanthomas, nares are without discharge. No apparent thyromegaly and/or mass  Lungs: Normal respiratory effort.  no wheezes, no rales, no rhonchi.  Heart: RRR with normal S1 S2. no murmur gallop, no rub, PMI is normal size and placement, carotid upstroke normal without bruit, jugular venous pressure is normal Abdomen: Soft, non-tender,  distended with normoactive bowel sounds. No hepatomegaly. No rebound/guarding. No obvious abdominal masses. Abdominal aorta is normal size without bruit Extremities: 1+ edema. no cyanosis, no clubbing, no ulcers  Peripheral : 2+ bilateral upper extremity pulses, 2+ bilateral femoral pulses, 2+ bilateral dorsal pedal pulse Neuro: Alert and oriented. No facial asymmetry. No focal deficit. Moves all extremities spontaneously. Musculoskeletal: Normal muscle tone  Psych:  Responds to questions appropriately with a normal affect.     Assessment: 40 year old male with severe dilated cardiomyopathy with chronic systolic dysfunction congestive heart failure essential hypertension makes hyperlipidemia and diabetes with complication having acute pancreatitis likely secondary to medication management for his heart failure without evidence of myocardial infarction  Plan: 1. Continue current medical regimen for further risk reduction of cardiomyopathy on  2. Discontinuation and abstain from furosemide at this  time due to concerns of causing pancreatitis 3. Further diuretic use including the possibility of Bumex or other medication management if able depending on significance of pancreatitis 4. No further cardiac diagnostics necessary at this time due to no evidence of myocardial infarction or exacerbation of congestive heart failure 5. Ambulation and follow for any further significant symptoms with adjustments of medications as outpatient  Signed, Corey Skains M.D. [facility] 09/13/2016, 6:35 AM

## 2016-09-13 NOTE — Care Management Note (Signed)
Case Management Note  Patient Details  Name: [name] MRN: [id number] Date of Birth: 02/21/1976  Subjective/Objective:     Discussed discharge planning with Oscar [name]. He chose [facility] to be his home health provider. Updated his address in EPIC to [address], [place], [place].  [name] referral for home health PT, RN, Aide was faxed to [facility] with address update. Phone number is unchanged per Oscar [name].               Action/Plan:   Expected Discharge Date:  09/13/16               Expected Discharge Plan:     In-House Referral:     Discharge planning Services     Post Acute Care Choice:    Choice offered to:     DME Arranged:    DME Agency:     HH Arranged:    HH Agency:     Status of Service:     If discussed at [employer], dates discussed:    Additional Comments:  Oscar Yeates A, RN 09/13/2016, 2:12 PM

## 2016-09-13 NOTE — Discharge Instructions (Signed)
Heart Failure Clinic appointment on September 22, 2016 at 10:00am with Darylene Price, [place]. Please call [phone] to reschedule.  Follow-up with primary care physician in a week Follow-up with cardiology Dr. Nehemiah Massed on January 15 please call office Monitor daily weights Home health

## 2016-09-13 NOTE — Clinical Social Work Note (Signed)
CSW received consult for "other" with no indication of needs. CSW contacted the RN who indicated no knowledge of consult. CSW is signing off as chart review indicates no CSW needs. CSW available for consult as needs arise.  Santiago Bumpers, MSW, Latanya Presser [phone]

## 2016-09-19 ENCOUNTER — Other Ambulatory Visit: Payer: Self-pay | Admitting: Family

## 2016-09-19 ENCOUNTER — Telehealth: Payer: Self-pay | Admitting: Family

## 2016-09-19 MED ORDER — SACUBITRIL-VALSARTAN 24-26 MG PO TABS: 1.0000 | ORAL_TABLET | Freq: Two times a day (BID) | ORAL | 3 refills | Status: DC

## 2016-09-19 MED ORDER — SPIRONOLACTONE 25 MG PO TABS: 25.0000 mg | ORAL_TABLET | Freq: Every day | ORAL | 3 refills | Status: DC

## 2016-09-19 MED ORDER — TORSEMIDE 20 MG PO TABS: 20.0000 mg | ORAL_TABLET | Freq: Every day | ORAL | 3 refills | Status: DC

## 2016-09-19 NOTE — Telephone Encounter (Signed)
Patient called to say that his torsemide had been stopped at hospital discharge due to pancreatitis. Since discharge his weight has fluctuated from 233-245 pounds and his feet are becoming more swollen. No change in his breathing status.  Upon reviewing medications, he is out of entresto and spironolactone. Advised him that I would send these prescriptions to [place]. Will also send in a prescription for torsemide 20mg  that he can take if needed over the weekend.  Does have a follow-up appointment scheduled with [place] on 09/22/16 and he was advised to bring all his bottles with him. Patient verbalized understanding.

## 2016-09-19 NOTE — Progress Notes (Signed)
Patient called to say that his diuretic had been stopped at discharge due to pancreatitis. He says that since discharge, his weight has fluctuated between 233-245 pounds and his feet are becoming more swollen. Doesn't feel like his shortness of breath is getting any worse.   In reviewing his medications, he says that he doesn't have his entresto nor his spironolactone. Advised him that I would send these prescriptions in to [facility]. I will also send in torsemide 20mg  so that if he needs it, he can take it.   He has a follow-up appointment scheduled with [place] on 09/22/16 and he was instructed to bring his medication bottles with him. Patient verbalized understanding.

## 2016-09-22 ENCOUNTER — Ambulatory Visit: Payer: Medicaid Other | Admitting: Family

## 2016-09-22 NOTE — ED Provider Notes (Signed)
[facility] Emergency Department Provider Note    First MD Initiated Contact with Patient 09/12/16 0211     (approximate)  I have reviewed the triage vital signs and the nursing notes.   HISTORY  Chief Complaint Abdominal Pain    HPI Oscar [name] is a 41 y.o. male with below list of chronic medical conditions including's coronary artery disease status post MI, CHF as well as recurrent pancreatitis presents to the emergency department with epigastric tenderness out of 10 abdominal pain with radiation to the chest times one week. Patient admits to nonbloody emesis. Patient denies any diarrhea.   Past Medical History:  Diagnosis Date  . Asthma   . Cardiomyopathy ([place])   . CHF (congestive heart failure) ([place])   . Coronary artery disease   . Deafness in right ear   . Diabetes mellitus without complication ([place])   . Dysrhythmia    svt  . 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 GERD (gastroesophageal reflux disease)   . Hyperlipidemia   . Hypertension   . Hypoxemia   . Myocardial infarction    2012,2014,2/16  . Pancreatitis   . Sleep apnea    osa    Patient Active Problem List   Diagnosis Date Noted  . Pancreatitis 09/12/2016  . Acute pancreatitis 07/06/2016  . Bronchitis 05/20/2016  . Acute on chronic systolic (congestive) heart failure 04/15/2016  . Constipation 11/08/2015  . Abdominal pain 11/02/2015  . Obstructive sleep apnea 07/31/2015  . Tobacco use 07/31/2015  . Diabetes ([place]) 07/10/2015  . Chronic systolic heart failure ([place]) 03/30/2015  . Cardiac pacemaker 03/30/2015  . Chronic respiratory failure with hypercapnia ([place]) 12/12/2014  . Elevated troponin   . Non-ischemic cardiomyopathy ([place])   . Hypoxemia   . SVT (supraventricular tachycardia) ([place]) 12/09/2014  . NSTEMI (non-ST elevated myocardial infarction) ([place]) 12/09/2014  . Hypertension 12/09/2014    Past Surgical History:  Procedure Laterality Date  . CARDIAC  CATHETERIZATION  12/11/2014   Procedure: RIGHT/LEFT HEART CATH AND CORONARY ANGIOGRAPHY;  Surgeon: Lorretta Harp, MD;  Location: [facility] CATH LAB;  Service: Cardiovascular;;  . ICD LEAD REMOVAL N/A 03/30/2015   Procedure: ICD LEAD REMOVAL;  Surgeon: Marzetta Board, MD;  Location: [facility] ORS;  Service: Cardiovascular;  Laterality: N/A;  . IMPLANTABLE CARDIOVERTER DEFIBRILLATOR IMPLANT    . INSERT / REPLACE / REMOVE PACEMAKER    . LEFT HEART CATHETERIZATION WITH CORONARY ANGIOGRAM N/A 12/09/2014   Procedure: LEFT HEART CATHETERIZATION WITH CORONARY ANGIOGRAM;  Surgeon: Burnell Blanks, MD;  Location: [facility] CATH LAB;  Service: Cardiovascular;  Laterality: N/A;    Prior to Admission medications   Medication Sig Start Date End Date Taking? Authorizing Provider  aspirin EC 325 MG tablet Take 1 tablet (325 mg total) by mouth daily. 07/10/15   Aldean Jewett, MD  atorvastatin (LIPITOR) 40 MG tablet Take 1 tablet (40 mg total) by mouth daily at 6 PM. 09/13/16   Nicholes Mango, MD  insulin glargine (LANTUS) 100 UNIT/ML injection Inject 0.1 mLs (10 Units total) into the skin at bedtime. Patient taking differently: Inject 15 Units into the skin at bedtime.  12/07/15   Alisa Graff, FNP  insulin starter kit- syringes MISC 1 kit by Other route once. 07/10/15   Aldean Jewett, MD  metFORMIN (GLUCOPHAGE) 1000 MG tablet Take 1,000 mg by mouth 2 (two) times daily with a meal.    Historical Provider, MD  metolazone (ZAROXOLYN) 5 MG tablet  Take 1 tablet (5 mg total) by mouth daily. 06/17/16 09/15/16  Alisa Graff, FNP  omeprazole (PRILOSEC) 20 MG capsule Take 1 capsule (20 mg total) by mouth daily. 04/01/16   Nori Riis, PA-C  oxyCODONE (OXY IR/ROXICODONE) 5 MG immediate release tablet Take 1 tablet (5 mg total) by mouth every 6 (six) hours as needed for moderate pain, severe pain or breakthrough pain. 09/13/16   Nicholes Mango, MD  polyethylene glycol (MIRALAX / GLYCOLAX) packet Take 17 g by mouth daily.     Historical Provider, MD  potassium chloride SA (K-DUR,KLOR-CON) 20 MEQ tablet Take 1 tablet (20 mEq total) by mouth 2 (two) times daily. 04/15/16   Alisa Graff, FNP  sacubitril-valsartan (ENTRESTO) 24-26 MG Take 1 tablet by mouth 2 (two) times daily. 09/19/16   Alisa Graff, FNP  spironolactone (ALDACTONE) 25 MG tablet Take 1 tablet (25 mg total) by mouth daily. 09/19/16 10/19/16  Alisa Graff, FNP  Syringe, Disposable, 1 ML MISC 0.1 mLs by Does not apply route as directed. 08/21/15   Alisa Graff, FNP  torsemide (DEMADEX) 20 MG tablet Take 1 tablet (20 mg total) by mouth daily. 09/19/16 10/19/16  Alisa Graff, FNP    Allergies Bidil [isosorb dinitrate-hydralazine]  Family History  Problem Relation Age of Onset  . Hypertension Mother   . Congestive Heart Failure Mother   . Hypertension Sister   . Diabetes Sister   . Pancreatitis Sister   . COPD Sister   . Pancreatitis Brother   . Anemia Neg Hx   . Arrhythmia Neg Hx   . Asthma Neg Hx   . Clotting disorder Neg Hx   . Fainting Neg Hx   . Heart attack Neg Hx   . Heart disease Neg Hx   . Heart failure Neg Hx   . Hyperlipidemia Neg Hx     Social History Social History  Substance Use Topics  . Smoking status: Current Every Day Smoker    Packs/day: 0.50    Years: 24.00    Types: Cigarettes  . Smokeless tobacco: Never Used  . Alcohol use No    Review of Systems Constitutional: No fever/chills Eyes: No visual changes. ENT: No sore throat. Cardiovascular: Denies chest pain. Respiratory: Denies shortness of breath. Gastrointestinal: Positive for abdominal pain and vomiting. Genitourinary: Negative for dysuria. Musculoskeletal: Negative for back pain. Skin: Negative for rash. Neurological: Negative for headaches, focal weakness or numbness.  10-point ROS otherwise negative.  ____________________________________________   PHYSICAL EXAM:  VITAL SIGNS: ED Triage Vitals  Enc Vitals Group     BP 09/12/16 0114 (!)  150/102     Pulse Rate 09/12/16 0114 94     Resp 09/12/16 0114 16     Temp 09/12/16 0114 98 F (36.7 C)     Temp Source 09/12/16 0114 Oral     SpO2 09/12/16 0114 98 %     Weight 09/12/16 0114 (!) 333 lb (151 kg)     Height 09/12/16 0114 5' 5"  (1.651 m)     Head Circumference --      Peak Flow --      Pain Score 09/12/16 0228 10     Pain Loc --      Pain Edu? --      Excl. in [place]? --     Constitutional: Alert and oriented. Apparent discomfort.  Eyes: Conjunctivae are normal. PERRL. EOMI. Head: Atraumatic. Mouth/Throat: Mucous membranes are moist.  Oropharynx non-erythematous. Neck: No stridor.  Cardiovascular: Normal rate, regular rhythm. Good peripheral circulation. Grossly normal heart sounds. Respiratory: Normal respiratory effort.  No retractions. Lungs CTAB. Gastrointestinal: Epigastric abdominal pain with palpation No distention.  Musculoskeletal: No lower extremity tenderness nor edema. No gross deformities of extremities. Neurologic:  Normal speech and language. No gross focal neurologic deficits are appreciated.  Skin:  Skin is warm, dry and intact. No rash noted. Psychiatric: Mood and affect are normal. Speech and behavior are normal.  ____________________________________________   LABS (all labs ordered are listed, but only abnormal results are displayed)  Labs Reviewed  LIPASE, BLOOD - Abnormal; Notable for the following:       Result Value   Lipase 621 (*)    All other components within normal limits  COMPREHENSIVE METABOLIC PANEL - Abnormal; Notable for the following:    Chloride 96 (*)    Glucose, Bld 409 (*)    Calcium 8.5 (*)    Total Protein 8.3 (*)    Alkaline Phosphatase 167 (*)    All other components within normal limits  URINALYSIS, COMPLETE (UACMP) WITH MICROSCOPIC - Abnormal; Notable for the following:    Color, Urine YELLOW (*)    APPearance CLEAR (*)    Glucose, UA >=500 (*)    Hgb urine dipstick SMALL (*)    Protein, ur >=300 (*)     Leukocytes, UA TRACE (*)    Squamous Epithelial / LPF 0-5 (*)    All other components within normal limits  TROPONIN I - Abnormal; Notable for the following:    Troponin I 0.14 (*)    All other components within normal limits  TROPONIN I - Abnormal; Notable for the following:    Troponin I 0.10 (*)    All other components within normal limits  TROPONIN I - Abnormal; Notable for the following:    Troponin I 0.10 (*)    All other components within normal limits  TROPONIN I - Abnormal; Notable for the following:    Troponin I 0.09 (*)    All other components within normal limits  TROPONIN I - Abnormal; Notable for the following:    Troponin I 0.09 (*)    All other components within normal limits  HEMOGLOBIN A1C - Abnormal; Notable for the following:    Hgb A1c MFr Bld 10.9 (*)    All other components within normal limits  LIPID PANEL - Abnormal; Notable for the following:    Cholesterol 247 (*)    Triglycerides 193 (*)    HDL 31 (*)    LDL Cholesterol 177 (*)    All other components within normal limits  GLUCOSE, CAPILLARY - Abnormal; Notable for the following:    Glucose-Capillary 382 (*)    All other components within normal limits  GLUCOSE, CAPILLARY - Abnormal; Notable for the following:    Glucose-Capillary 345 (*)    All other components within normal limits  GLUCOSE, CAPILLARY - Abnormal; Notable for the following:    Glucose-Capillary 209 (*)    All other components within normal limits  COMPREHENSIVE METABOLIC PANEL - Abnormal; Notable for the following:    Chloride 98 (*)    Glucose, Bld 235 (*)    Calcium 8.3 (*)    Albumin 3.2 (*)    Alkaline Phosphatase 140 (*)    All other components within normal limits  LIPASE, BLOOD - Abnormal; Notable for the following:    Lipase 58 (*)    All other components within normal limits  GLUCOSE, CAPILLARY - Abnormal;  Notable for the following:    Glucose-Capillary 169 (*)    All other components within normal limits    GLUCOSE, CAPILLARY - Abnormal; Notable for the following:    Glucose-Capillary 258 (*)    All other components within normal limits  GLUCOSE, CAPILLARY - Abnormal; Notable for the following:    Glucose-Capillary 212 (*)    All other components within normal limits  URINE CULTURE  CBC  TSH   ____________________________________________  EKG  ED ECG REPORT I, [place] N Kamea Dacosta, the attending physician, personally viewed and interpreted this ECG.   Date: 09/22/2016  EKG Time: 1:19 a.m.  Rate: 95  Rhythm: Normal sinus rhythm  Axis: Normal  Intervals: Normal  ST&T Change: None    Procedures     INITIAL IMPRESSION / ASSESSMENT AND PLAN / ED COURSE  Pertinent labs & imaging results that were available during my care of the patient were reviewed by me and considered in my medical decision making (see chart for details).        ____________________________________________  FINAL CLINICAL IMPRESSION(S) / ED DIAGNOSES  Pancreatitis  MEDICATIONS GIVEN DURING THIS VISIT:  Medications  morphine 4 MG/ML injection 4 mg (4 mg Intravenous Given 09/12/16 0228)  ondansetron (ZOFRAN) injection 4 mg (4 mg Intravenous Given 09/12/16 0228)  morphine 4 MG/ML injection 4 mg (4 mg Intravenous Given 09/12/16 0637)     NEW OUTPATIENT MEDICATIONS STARTED DURING THIS VISIT:  Discharge Medication List as of 09/13/2016  2:01 PM    START taking these medications   Details  atorvastatin (LIPITOR) 40 MG tablet Take 1 tablet (40 mg total) by mouth daily at 6 PM., Starting Sat 09/13/2016, Print    oxyCODONE (OXY IR/ROXICODONE) 5 MG immediate release tablet Take 1 tablet (5 mg total) by mouth every 6 (six) hours as needed for moderate pain, severe pain or breakthrough pain., Starting Sat 09/13/2016, Print        Discharge Medication List as of 09/13/2016  2:01 PM      Discharge Medication List as of 09/13/2016  2:01 PM    STOP taking these medications     torsemide (DEMADEX) 20 MG  tablet Comments:  Reason for Stopping:           Note:  This document was prepared using Dragon voice recognition software and may include unintentional dictation errors.    Gregor Hams, MD 09/24/16 [phone]

## 2016-09-24 ENCOUNTER — Ambulatory Visit: Payer: Medicaid Other | Admitting: Family

## 2016-09-25 ENCOUNTER — Telehealth: Payer: Self-pay

## 2016-09-25 ENCOUNTER — Other Ambulatory Visit: Payer: Self-pay | Admitting: Family

## 2016-09-25 NOTE — Telephone Encounter (Signed)
Spoke with Rip Harbour from [place] regarding patient's medications. Informed her that patient doesn't come with his medication bottles or a list so I'm not entirely sure that our list is accurate. She requests that a list be faxed to [place] anyways to give her a starting point.   He had missed his CPAP titration study and she has since rescheduled that to early February 2018. He currently has a follow-up appointment scheduled with [place] on 10/01/16.

## 2016-09-25 NOTE — Telephone Encounter (Signed)
Oscar [name] is starting to see Oscar [name]. She states that he has been noncompliant with taking his medications and picking them up from the pharmacy. She would like to review his medications with you, to ensure he is taking what he should be. She also states that his blood pressure reading was elevated when she checked it today but did not state the reading. Please contact her to discuss patients medications.

## 2016-10-01 ENCOUNTER — Ambulatory Visit: Payer: Medicaid Other | Attending: Family | Admitting: Family

## 2016-10-01 ENCOUNTER — Encounter: Payer: Self-pay | Admitting: Family

## 2016-10-01 VITALS — BP 124/94 | HR 90 | Resp 18 | Ht 65.0 in | Wt 348.1 lb

## 2016-10-01 DIAGNOSIS — I110 Hypertensive heart disease with heart failure: Secondary | ICD-10-CM | POA: Diagnosis not present

## 2016-10-01 DIAGNOSIS — E119 Type 2 diabetes mellitus without complications: Secondary | ICD-10-CM | POA: Diagnosis not present

## 2016-10-01 DIAGNOSIS — Z5189 Encounter for other specified aftercare: Secondary | ICD-10-CM | POA: Insufficient documentation

## 2016-10-01 DIAGNOSIS — G4733 Obstructive sleep apnea (adult) (pediatric): Secondary | ICD-10-CM | POA: Diagnosis not present

## 2016-10-01 DIAGNOSIS — Z794 Long term (current) use of insulin: Secondary | ICD-10-CM | POA: Insufficient documentation

## 2016-10-01 DIAGNOSIS — Z720 Tobacco use: Secondary | ICD-10-CM

## 2016-10-01 DIAGNOSIS — Z79899 Other long term (current) drug therapy: Secondary | ICD-10-CM | POA: Diagnosis not present

## 2016-10-01 DIAGNOSIS — Z9889 Other specified postprocedural states: Secondary | ICD-10-CM | POA: Diagnosis not present

## 2016-10-01 DIAGNOSIS — Z7982 Long term (current) use of aspirin: Secondary | ICD-10-CM | POA: Diagnosis not present

## 2016-10-01 DIAGNOSIS — I5022 Chronic systolic (congestive) heart failure: Secondary | ICD-10-CM | POA: Diagnosis not present

## 2016-10-01 DIAGNOSIS — F17210 Nicotine dependence, cigarettes, uncomplicated: Secondary | ICD-10-CM | POA: Diagnosis not present

## 2016-10-01 DIAGNOSIS — I10 Essential (primary) hypertension: Secondary | ICD-10-CM

## 2016-10-01 MED ORDER — SACUBITRIL-VALSARTAN 24-26 MG PO TABS: 1.0000 | ORAL_TABLET | Freq: Two times a day (BID) | ORAL | 0 refills | Status: DC

## 2016-10-01 MED ORDER — SACUBITRIL-VALSARTAN 24-26 MG PO TABS: 1.0000 | ORAL_TABLET | Freq: Two times a day (BID) | ORAL | 5 refills | Status: DC

## 2016-10-01 NOTE — Progress Notes (Addendum)
Patient ID: Oscar [name], male    DOB: 15-Oct-1975, 41 y.o.   MRN: [id number]  HPI  Oscar [name] is a 41 y/o male with a history of obstructive sleep apnea (currently without CPAP), MI, HTN, hyperlipidemia, GERD, DM, SVT, asthma and chronic heart failure.  Last echo was done 02/06/15 which showed an EF of 25% which is unchanged from previous echo on 12/10/14 which showed an EF of 25% and mild Oscar.   Admitted on 09/12/16 due to pancreatitis possibly due to diuretic. Elevated troponin thought to be due to demand ischemia.  Diuretic was held and patient was discharged home. Previous admission was 07/06/16 for chest pain and acute pancreatitis. Cardiology consult was obtained and patient was discharged home. Was in the ED on 03/10/16 with HF exacerbation and was treated and released. Prior admission on 11/22/15 was for a NSTEMI. Cardiology consult obtained during hospitalization and his defibrillator was interrogated. Sleep study was done on 05/06/16 but no results at this time.   He presents today for a follow-up visit with fatigue and shortness of breath with little exertion. Denies symptoms at rest. Denies any swelling in his legs/abdomen. Weight at home is decreasing. Wears oxygen at 2L. Has not gotten CPAP supplies as of yet. Continues to smoke. Has been out of entresto for the last 3 weeks.   Past Medical History:  Diagnosis Date  . Asthma   . Cardiomyopathy ([place])   . CHF (congestive heart failure) ([place])   . Coronary artery disease   . Deafness in right ear   . Diabetes mellitus without complication ([place])   . Dysrhythmia    svt  . 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 GERD (gastroesophageal reflux disease)   . Hyperlipidemia   . Hypertension   . Hypoxemia   . Myocardial infarction    2012,2014,2/16  . Pancreatitis   . Sleep apnea    osa   Past Surgical History:  Procedure Laterality Date  . CARDIAC CATHETERIZATION  12/11/2014   Procedure: RIGHT/LEFT HEART CATH AND CORONARY  ANGIOGRAPHY;  Surgeon: Lorretta Harp, MD;  Location: [facility] CATH LAB;  Service: Cardiovascular;;  . ICD LEAD REMOVAL N/A 03/30/2015   Procedure: ICD LEAD REMOVAL;  Surgeon: Marzetta Board, MD;  Location: [facility] ORS;  Service: Cardiovascular;  Laterality: N/A;  . IMPLANTABLE CARDIOVERTER DEFIBRILLATOR IMPLANT    . INSERT / REPLACE / REMOVE PACEMAKER    . LEFT HEART CATHETERIZATION WITH CORONARY ANGIOGRAM N/A 12/09/2014   Procedure: LEFT HEART CATHETERIZATION WITH CORONARY ANGIOGRAM;  Surgeon: Burnell Blanks, MD;  Location: [facility] CATH LAB;  Service: Cardiovascular;  Laterality: N/A;   Family History  Problem Relation Age of Onset  . Hypertension Mother   . Congestive Heart Failure Mother   . Hypertension Sister   . Diabetes Sister   . Pancreatitis Sister   . COPD Sister   . Pancreatitis Brother   . Anemia Neg Hx   . Arrhythmia Neg Hx   . Asthma Neg Hx   . Clotting disorder Neg Hx   . Fainting Neg Hx   . Heart attack Neg Hx   . Heart disease Neg Hx   . Heart failure Neg Hx   . Hyperlipidemia Neg Hx    Social History  Substance Use Topics  . Smoking status: Current Every Day Smoker    Packs/day: 0.50    Years: 24.00    Types: Cigarettes  . Smokeless tobacco: Never Used  . Alcohol use  No   Allergies  Allergen Reactions  . Bidil [Isosorb Dinitrate-Hydralazine] Other (See Comments)    Migraine Headache   Prior to Admission medications   Medication Sig Start Date End Date Taking? Authorizing Provider  aspirin EC 325 MG tablet Take 1 tablet (325 mg total) by mouth daily. 07/10/15  Yes Aldean Jewett, MD  atorvastatin (LIPITOR) 40 MG tablet Take 1 tablet (40 mg total) by mouth daily at 6 PM. 09/13/16  Yes Nicholes Mango, MD  insulin glargine (LANTUS) 100 UNIT/ML injection Inject 0.1 mLs (10 Units total) into the skin at bedtime. Patient taking differently: Inject 15 Units into the skin at bedtime.  12/07/15  Yes Alisa Graff, FNP  insulin starter kit- syringes MISC 1 kit by Other  route once. 07/10/15  Yes Aldean Jewett, MD  metFORMIN (GLUCOPHAGE) 1000 MG tablet Take 1,000 mg by mouth 2 (two) times daily with a meal.   Yes Historical Provider, MD  omeprazole (PRILOSEC) 20 MG capsule Take 1 capsule (20 mg total) by mouth daily. 04/01/16  Yes Shannon A McGowan, PA-C  oxyCODONE (OXY IR/ROXICODONE) 5 MG immediate release tablet Take 1 tablet (5 mg total) by mouth every 6 (six) hours as needed for moderate pain, severe pain or breakthrough pain. 09/13/16  Yes Nicholes Mango, MD  polyethylene glycol (MIRALAX / GLYCOLAX) packet Take 17 g by mouth daily.   Yes Historical Provider, MD  potassium chloride SA (K-DUR,KLOR-CON) 20 MEQ tablet Take 1 tablet (20 mEq total) by mouth 2 (two) times daily. 04/15/16  Yes Alisa Graff, FNP  spironolactone (ALDACTONE) 25 MG tablet Take 1 tablet (25 mg total) by mouth daily. 09/19/16 10/19/16 Yes Alisa Graff, FNP  Syringe, Disposable, 1 ML MISC 0.1 mLs by Does not apply route as directed. 08/21/15  Yes Alisa Graff, FNP  torsemide (DEMADEX) 20 MG tablet Take 20 mg by mouth 2 (two) times daily.   Yes Historical Provider, MD           Review of Systems  Constitutional: Positive for fatigue. Negative for appetite change.  HENT: Negative for congestion, postnasal drip and sore throat.   Eyes: Negative.   Respiratory: Positive for cough (sometimes at night) and shortness of breath (at times). Negative for chest tightness.   Cardiovascular: Negative for chest pain, palpitations and leg swelling.  Gastrointestinal: Negative for abdominal distention and abdominal pain.  Endocrine: Negative.   Genitourinary: Negative.   Musculoskeletal: Positive for arthralgias (top of both feet and fingers are stiff feeling). Negative for back pain.  Skin: Negative.   Allergic/Immunologic: Negative.   Neurological: Negative for dizziness and light-headedness.  Hematological: Negative for adenopathy. Does not bruise/bleed easily.  Psychiatric/Behavioral: Positive  for dysphoric mood (little sad) and sleep disturbance (wake up tired; sleeping with 2L oxygen). Negative for suicidal ideas. The patient is not nervous/anxious.    Vitals:   10/01/16 1246  BP: (!) 124/94  Pulse: 90  Resp: 18  SpO2: 95%  Weight: (!) 348 lb 2 oz (157.9 kg)  Height: _0  (1.651 m)   Wt Readings from Last 3 Encounters:  10/01/16 (!) 348 lb 2 oz (157.9 kg)  09/13/16 (!) 345 lb 12.8 oz (156.9 kg)  07/06/16 (!) 340 lb 3.2 oz (154.3 kg)   Lab Results  Component Value Date   CREATININE 0.91 09/13/2016   CREATININE 1.08 09/12/2016   CREATININE 0.85 07/07/2016   Physical Exam  Constitutional: He is oriented to person, place, and time. He appears well-developed and well-nourished.  HENT:  Head: Normocephalic and atraumatic.  Eyes: Conjunctivae are normal. Pupils are equal, round, and reactive to light.  Neck: Normal range of motion. Neck supple. No JVD present.  Cardiovascular: Normal rate and regular rhythm.   Pulmonary/Chest: Effort normal. He has no wheezes. He has no rales.  Abdominal: Soft. He exhibits no distension. There is no tenderness.  Musculoskeletal: He exhibits no edema or tenderness.  Neurological: He is alert and oriented to person, place, and time.  Skin: Skin is warm and dry.  Psychiatric: He has a normal mood and affect. His behavior is normal. Thought content normal.  Nursing note and vitals reviewed.  Assessment & Plan:  1: Chronic heart failure with reduced ejection fraction- - NYHA class III - euvolemic today - weight down 5 pounds from when he was last seen Oct 2017 although it's up a few pounds from hospital discharge. Reminded to call for an overnight weight gain of >2 pounds or a weekly weight gain of >5 pounds. -been out of entresto for the last 3 weeks. 2 weeks of samples provided and a new prescription was sent in as well - last saw cardiology 01/16/16. Needs appointment scheduled - consider adding toprol XL - bidil gave him migraine  headache  2: HTN- - BP elevated slightly - resume entresto per above - sees PCP [facility]) on 10/07/16  3: Obstructive sleep apnea- - goes back to sleep lab 10/07/16 to get CPAP equipment - wearing oxygen at 2L  4: Tobacco use- - continues to smoke 4 cigarettes daily - does remove himself from the oxygen when smoking - complete cessation discussed for 3 minutes with him  Patient did not bring her medications nor a list. Each medication was verbally reviewed with the patient and she was encouraged to bring the bottles to every visit to confirm accuracy of list.  [facility] is not coming to patient's home to help with medication management.  Return in 1 month or sooner for any questions/problems before then.

## 2016-10-01 NOTE — Patient Instructions (Signed)
Continue weighing daily and call for an overnight weight gain of > 2 pounds or a weekly weight gain of >5 pounds.    Smoking Cessation Quitting smoking is important to your health and has many advantages. However, it is not always easy to quit since nicotine is a very addictive drug. Oftentimes, people try 3 times or more before being able to quit. This document explains the best ways for you to prepare to quit smoking. Quitting takes hard work and a lot of effort, but you can do it. ADVANTAGES OF QUITTING SMOKING  You will live longer, feel better, and live better.  Your body will feel the impact of quitting smoking almost immediately.  Within 20 minutes, blood pressure decreases. Your pulse returns to its normal level.  After 8 hours, carbon monoxide levels in the blood return to normal. Your oxygen level increases.  After 24 hours, the chance of having a heart attack starts to decrease. Your breath, hair, and body stop smelling like smoke.  After 48 hours, damaged nerve endings begin to recover. Your sense of taste and smell improve.  After 72 hours, the body is virtually free of nicotine. Your bronchial tubes relax and breathing becomes easier.  After 2 to 12 weeks, lungs can hold more air. Exercise becomes easier and circulation improves.  The risk of having a heart attack, stroke, cancer, or lung disease is greatly reduced.  After 1 year, the risk of coronary heart disease is cut in half.  After 5 years, the risk of stroke falls to the same as a nonsmoker.  After 10 years, the risk of lung cancer is cut in half and the risk of other cancers decreases significantly.  After 15 years, the risk of coronary heart disease drops, usually to the level of a nonsmoker.  If you are pregnant, quitting smoking will improve your chances of having a healthy baby.  The people you live with, especially any children, will be healthier.  You will have extra money to spend on things other  than cigarettes. QUESTIONS TO THINK ABOUT BEFORE ATTEMPTING TO QUIT You may want to talk about your answers with your health care provider.  Why do you want to quit?  If you tried to quit in the past, what helped and what did not?  What will be the most difficult situations for you after you quit? How will you plan to handle them?  Who can help you through the tough times? Your family? Friends? A health care provider?  What pleasures do you get from smoking? What ways can you still get pleasure if you quit? Here are some questions to ask your health care provider:  How can you help me to be successful at quitting?  What medicine do you think would be best for me and how should I take it?  What should I do if I need more help?  What is smoking withdrawal like? How can I get information on withdrawal? GET READY  Set a quit date.  Change your environment by getting rid of all cigarettes, ashtrays, matches, and lighters in your home, car, or work. Do not let people smoke in your home.  Review your past attempts to quit. Think about what worked and what did not. GET SUPPORT AND ENCOURAGEMENT You have a better chance of being successful if you have help. You can get support in many ways.  Tell your family, friends, and coworkers that you are going to quit and need their support. Ask   them not to smoke around you.  Get individual, group, or telephone counseling and support. Programs are available at local hospitals and health centers. Call your local health department for information about programs in your area.  Spiritual beliefs and practices may help some smokers quit.  Download a "quit meter" on your computer to keep track of quit statistics, such as how long you have gone without smoking, cigarettes not smoked, and money saved.  Get a self-help book about quitting smoking and staying off tobacco. LEARN NEW SKILLS AND BEHAVIORS  Distract yourself from urges to smoke. Talk to  someone, go for a walk, or occupy your time with a task.  Change your normal routine. Take a different route to work. Drink tea instead of coffee. Eat breakfast in a different place.  Reduce your stress. Take a hot bath, exercise, or read a book.  Plan something enjoyable to do every day. Reward yourself for not smoking.  Explore interactive web-based programs that specialize in helping you quit. GET MEDICINE AND USE IT CORRECTLY Medicines can help you stop smoking and decrease the urge to smoke. Combining medicine with the above behavioral methods and support can greatly increase your chances of successfully quitting smoking.  Nicotine replacement therapy helps deliver nicotine to your body without the negative effects and risks of smoking. Nicotine replacement therapy includes nicotine gum, lozenges, inhalers, nasal sprays, and skin patches. Some may be available over-the-counter and others require a prescription.  Antidepressant medicine helps people abstain from smoking, but how this works is unknown. This medicine is available by prescription.  Nicotinic receptor partial agonist medicine simulates the effect of nicotine in your brain. This medicine is available by prescription. Ask your health care provider for advice about which medicines to use and how to use them based on your health history. Your health care provider will tell you what side effects to look out for if you choose to be on a medicine or therapy. Carefully read the information on the package. Do not use any other product containing nicotine while using a nicotine replacement product.  RELAPSE OR DIFFICULT SITUATIONS Most relapses occur within the first 3 months after quitting. Do not be discouraged if you start smoking again. Remember, most people try several times before finally quitting. You may have symptoms of withdrawal because your body is used to nicotine. You may crave cigarettes, be irritable, feel very hungry, cough  often, get headaches, or have difficulty concentrating. The withdrawal symptoms are only temporary. They are strongest when you first quit, but they will go away within 10-14 days. To reduce the chances of relapse, try to:  Avoid drinking alcohol. Drinking lowers your chances of successfully quitting.  Reduce the amount of caffeine you consume. Once you quit smoking, the amount of caffeine in your body increases and can give you symptoms, such as a rapid heartbeat, sweating, and anxiety.  Avoid smokers because they can make you want to smoke.  Do not let weight gain distract you. Many smokers will gain weight when they quit, usually less than 10 pounds. Eat a healthy diet and stay active. You can always lose the weight gained after you quit.  Find ways to improve your mood other than smoking. FOR MORE INFORMATION  www.smokefree.gov  Document Released: 08/12/2001 Document Revised: 01/02/2014 Document Reviewed: 11/27/2011 [employer] Patient Information 2015 [employer]. This information is not intended to replace advice given to you by your health care provider. Make sure you discuss any questions you have with your   health care provider.  

## 2016-10-02 ENCOUNTER — Telehealth: Payer: Self-pay

## 2016-10-02 NOTE — Telephone Encounter (Signed)
LM for Melinda at [facility] regarding patient's increased shortness of breath without weight gain. Seen in the clinic yesterday and Entresto 24/26mg  was resumed as he had been out of it for the last 3 weeks. Message left that patient could take an additional 20mg  torsemide today and if symptoms persist, to call us back. Also asked her to double check to make sure that he started the entresto back since samples were given to him yesterday.

## 2016-10-02 NOTE — Telephone Encounter (Signed)
Oscar [name] with [facility] called she wanted to let you know that she saw Mr. Glave today. Although there has been no changes in his weight he is complaining of more shortness of breath and states that he had to sleep sitting up in his chair.   Rip Harbour would like to know if you would like to make any changes?

## 2016-10-29 ENCOUNTER — Encounter: Payer: Self-pay | Admitting: Family

## 2016-10-29 ENCOUNTER — Ambulatory Visit: Payer: Medicaid Other | Attending: Family | Admitting: Family

## 2016-10-29 VITALS — BP 137/100 | HR 96 | Resp 20 | Ht 68.0 in | Wt 345.0 lb

## 2016-10-29 DIAGNOSIS — F17210 Nicotine dependence, cigarettes, uncomplicated: Secondary | ICD-10-CM | POA: Insufficient documentation

## 2016-10-29 DIAGNOSIS — Z7982 Long term (current) use of aspirin: Secondary | ICD-10-CM | POA: Diagnosis not present

## 2016-10-29 DIAGNOSIS — Z8249 Family history of ischemic heart disease and other diseases of the circulatory system: Secondary | ICD-10-CM | POA: Insufficient documentation

## 2016-10-29 DIAGNOSIS — Z79899 Other long term (current) drug therapy: Secondary | ICD-10-CM | POA: Diagnosis not present

## 2016-10-29 DIAGNOSIS — Z833 Family history of diabetes mellitus: Secondary | ICD-10-CM | POA: Insufficient documentation

## 2016-10-29 DIAGNOSIS — Z794 Long term (current) use of insulin: Secondary | ICD-10-CM | POA: Diagnosis not present

## 2016-10-29 DIAGNOSIS — Z9889 Other specified postprocedural states: Secondary | ICD-10-CM | POA: Insufficient documentation

## 2016-10-29 DIAGNOSIS — E785 Hyperlipidemia, unspecified: Secondary | ICD-10-CM | POA: Diagnosis not present

## 2016-10-29 DIAGNOSIS — I5022 Chronic systolic (congestive) heart failure: Secondary | ICD-10-CM | POA: Diagnosis not present

## 2016-10-29 DIAGNOSIS — K219 Gastro-esophageal reflux disease without esophagitis: Secondary | ICD-10-CM | POA: Insufficient documentation

## 2016-10-29 DIAGNOSIS — R0602 Shortness of breath: Secondary | ICD-10-CM | POA: Insufficient documentation

## 2016-10-29 DIAGNOSIS — I252 Old myocardial infarction: Secondary | ICD-10-CM | POA: Insufficient documentation

## 2016-10-29 DIAGNOSIS — Z5189 Encounter for other specified aftercare: Secondary | ICD-10-CM | POA: Diagnosis not present

## 2016-10-29 DIAGNOSIS — E119 Type 2 diabetes mellitus without complications: Secondary | ICD-10-CM | POA: Insufficient documentation

## 2016-10-29 DIAGNOSIS — I10 Essential (primary) hypertension: Secondary | ICD-10-CM

## 2016-10-29 DIAGNOSIS — G4733 Obstructive sleep apnea (adult) (pediatric): Secondary | ICD-10-CM | POA: Diagnosis not present

## 2016-10-29 DIAGNOSIS — R5383 Other fatigue: Secondary | ICD-10-CM | POA: Insufficient documentation

## 2016-10-29 DIAGNOSIS — Z720 Tobacco use: Secondary | ICD-10-CM

## 2016-10-29 DIAGNOSIS — I110 Hypertensive heart disease with heart failure: Secondary | ICD-10-CM | POA: Insufficient documentation

## 2016-10-29 MED ORDER — SACUBITRIL-VALSARTAN 49-51 MG PO TABS: 1.0000 | ORAL_TABLET | Freq: Two times a day (BID) | ORAL | 5 refills | Status: DC

## 2016-10-29 NOTE — Progress Notes (Signed)
Patient ID: Oscar [name], male    DOB: [birth year], 41 y.o.   MRN: [id number]  HPI  Mr Mahany is a 41 y/o male with a history of obstructive sleep apnea (currently without CPAP), MI, HTN, hyperlipidemia, GERD, DM, SVT, asthma and chronic heart failure.  Last echo was done 02/06/15 which showed an EF of 25% which is unchanged from previous echo on 12/10/14 which showed an EF of 25% and mild MR.   Admitted on 09/12/16 due to pancreatitis possibly due to diuretic. Elevated troponin thought to be due to demand ischemia.  Diuretic was held and patient was discharged home. Previous admission was 07/06/16 for chest pain and acute pancreatitis. Cardiology consult was obtained and patient was discharged home. Was in the ED on 03/10/16 with HF exacerbation and was treated and released. Prior admission on 11/22/15 was for a NSTEMI. Cardiology consult obtained during hospitalization and his defibrillator was interrogated. Sleep study was done on 05/06/16 but no results at this time.   He presents today for a follow-up visit with fatigue and shortness of breath with little exertion. Denies symptoms at rest. Denies any swelling in his legs/abdomen. Weight at home is decreasing. Wears oxygen at 2L although he didn't wear it in today. Has been wearing his CPAP for about one month now. Glucose has been running high and he's following with his PCP about this. Does have home health coming weekly now.   Past Medical History:  Diagnosis Date  . Asthma   . Cardiomyopathy ([place])   . CHF (congestive heart failure) ([place])   . Coronary artery disease   . Deafness in right ear   . Diabetes mellitus without complication ([place])   . Dysrhythmia    svt  . 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 GERD (gastroesophageal reflux disease)   . Hyperlipidemia   . Hypertension   . Hypoxemia   . Myocardial infarction    2012,2014,2/16  . Pancreatitis   . Sleep apnea    osa   Past Surgical History:  Procedure Laterality  Date  . CARDIAC CATHETERIZATION  12/11/2014   Procedure: RIGHT/LEFT HEART CATH AND CORONARY ANGIOGRAPHY;  Surgeon: Lorretta Harp, MD;  Location: [facility] CATH LAB;  Service: Cardiovascular;;  . ICD LEAD REMOVAL N/A 03/30/2015   Procedure: ICD LEAD REMOVAL;  Surgeon: Marzetta Board, MD;  Location: [facility] ORS;  Service: Cardiovascular;  Laterality: N/A;  . IMPLANTABLE CARDIOVERTER DEFIBRILLATOR IMPLANT    . INSERT / REPLACE / REMOVE PACEMAKER    . LEFT HEART CATHETERIZATION WITH CORONARY ANGIOGRAM N/A 12/09/2014   Procedure: LEFT HEART CATHETERIZATION WITH CORONARY ANGIOGRAM;  Surgeon: Burnell Blanks, MD;  Location: [facility] CATH LAB;  Service: Cardiovascular;  Laterality: N/A;   Family History  Problem Relation Age of Onset  . Hypertension Mother   . Congestive Heart Failure Mother   . Hypertension Sister   . Diabetes Sister   . Pancreatitis Sister   . COPD Sister   . Pancreatitis Brother   . Anemia Neg Hx   . Arrhythmia Neg Hx   . Asthma Neg Hx   . Clotting disorder Neg Hx   . Fainting Neg Hx   . Heart attack Neg Hx   . Heart disease Neg Hx   . Heart failure Neg Hx   . Hyperlipidemia Neg Hx    Social History  Substance Use Topics  . Smoking status: Current Every Day Smoker    Packs/day: 0.50    Years:  24.00    Types: Cigarettes  . Smokeless tobacco: Never Used  . Alcohol use No   Allergies  Allergen Reactions  . Bidil [Isosorb Dinitrate-Hydralazine] Other (See Comments)    Migraine Headache     Prior to Admission medications   Medication Sig Start Date End Date Taking? Authorizing Provider  aspirin EC 325 MG tablet Take 1 tablet (325 mg total) by mouth daily. 07/10/15  Yes Aldean Jewett, MD  atorvastatin (LIPITOR) 40 MG tablet Take 1 tablet (40 mg total) by mouth daily at 6 PM. 09/13/16  Yes Nicholes Mango, MD  insulin glargine (LANTUS) 100 UNIT/ML injection Inject 0.1 mLs (10 Units total) into the skin at bedtime. Patient taking differently: Inject 20 Units into the skin at  bedtime.  12/07/15  Yes Alisa Graff, FNP  insulin starter kit- syringes MISC 1 kit by Other route once. 07/10/15  Yes Aldean Jewett, MD  metFORMIN (GLUCOPHAGE) 1000 MG tablet Take 1,000 mg by mouth 2 (two) times daily with a meal.   Yes Historical Provider, MD  omeprazole (PRILOSEC) 20 MG capsule Take 1 capsule (20 mg total) by mouth daily. 04/01/16  Yes Shannon A McGowan, PA-C  oxyCODONE (OXY IR/ROXICODONE) 5 MG immediate release tablet Take 1 tablet (5 mg total) by mouth every 6 (six) hours as needed for moderate pain, severe pain or breakthrough pain. 09/13/16  Yes Nicholes Mango, MD  polyethylene glycol (MIRALAX / GLYCOLAX) packet Take 17 g by mouth daily.   Yes Historical Provider, MD  potassium chloride SA (K-DUR,KLOR-CON) 20 MEQ tablet Take 1 tablet (20 mEq total) by mouth 2 (two) times daily. 04/15/16  Yes Alisa Graff, FNP  sacubitril-valsartan (ENTRESTO) 24-26 MG Take 1 tablet by mouth 2 (two) times daily. 10/01/16  Yes Alisa Graff, FNP  Syringe, Disposable, 1 ML MISC 0.1 mLs by Does not apply route as directed. 08/21/15  Yes Alisa Graff, FNP  torsemide (DEMADEX) 20 MG tablet Take 20 mg by mouth 2 (two) times daily.   Yes Historical Provider, MD  spironolactone (ALDACTONE) 25 MG tablet Take 1 tablet (25 mg total) by mouth daily. 09/19/16 10/19/16  Alisa Graff, FNP    Review of Systems  Constitutional: Positive for fatigue. Negative for appetite change.  HENT: Positive for rhinorrhea. Negative for congestion and sore throat.   Eyes: Negative.   Respiratory: Positive for cough and shortness of breath. Negative for chest tightness.   Cardiovascular: Negative for chest pain, palpitations and leg swelling.  Gastrointestinal: Negative for abdominal distention and abdominal pain.  Endocrine: Negative.   Genitourinary: Negative.   Musculoskeletal: Negative for back pain and neck pain.  Skin: Negative.   Allergic/Immunologic: Negative.   Neurological: Positive for numbness (bilateral  feet). Negative for dizziness and light-headedness.  Hematological: Negative for adenopathy. Does not bruise/bleed easily.  Psychiatric/Behavioral: Negative for dysphoric mood, sleep disturbance (wearing CPAP nightly; sleeping on 4 pillows) and suicidal ideas. The patient is not nervous/anxious.    Vitals:   10/29/16 1127  BP: (!) 137/100  Pulse: 96  Resp: 20  SpO2: 95%  Weight: (!) 345 lb (156.5 kg)  Height: 5' 8"  (1.727 m)   Wt Readings from Last 3 Encounters:  10/29/16 (!) 345 lb (156.5 kg)  10/01/16 (!) 348 lb 2 oz (157.9 kg)  09/13/16 (!) 345 lb 12.8 oz (156.9 kg)   Lab Results  Component Value Date   CREATININE 0.91 09/13/2016   CREATININE 1.08 09/12/2016   CREATININE 0.85 07/07/2016  Physical Exam  Constitutional: He is oriented to person, place, and time. He appears well-developed and well-nourished.  HENT:  Head: Normocephalic and atraumatic.  Eyes: Conjunctivae are normal. Pupils are equal, round, and reactive to light.  Neck: Normal range of motion. Neck supple. No JVD present.  Cardiovascular: Normal rate and regular rhythm.   Pulmonary/Chest: Effort normal. He has no wheezes. He has no rales.  Abdominal: Soft. He exhibits no distension. There is no tenderness.  Musculoskeletal: He exhibits no edema or tenderness.  Neurological: He is alert and oriented to person, place, and time.  Skin: Skin is warm and dry.  Psychiatric: He has a normal mood and affect. His behavior is normal. Thought content normal.  Nursing note and vitals reviewed.    Assessment & Plan:  1: Chronic heart failure with reduced ejection fraction- - NYHA class III - euvolemic today - weight down another 3 pounds from when he was last seen. Reminded to call for an overnight weight gain of >2 pounds or a weekly weight gain of >5 pounds. - last saw cardiology [name]) 09/08/16 and returns to him 03/11/17 - increase entresto to 49/33m twice daily - could consider adding toprol xl  - bidil  gave him migraine headache  2: HTN- - BP elevated slightly - increase entresto per above - sees [facility]CP ([facility] on 11/12/16  3: Obstructive sleep apnea- - has been wearing his CPAP nightly for about the last month and reports feeling better since wearing it. Doesn't feel as tired and stays awake more during the day - wearing oxygen at 2L although he didn't wear it in today as his tank was empty  4: Tobacco use- - continues to smoke 4 cigarettes daily - does remove himself from the oxygen when smoking - complete cessation discussed for 3 minutes with him  Patient did not bring his medications nor a list. Each medication was verbally reviewed with the patient and he was encouraged to bring the bottles to every visit to confirm accuracy of list.  ABordenis now coming to patient's home to help with medication management.  Return in 1 month or sooner for any questions/problems before then. Will check a BMP at that time since increasing the entresto.

## 2016-10-29 NOTE — Patient Instructions (Addendum)
Resume weighing daily and call for an overnight weight gain of > 2 pounds or a weekly weight gain of >5 pounds. 

## 2016-11-10 ENCOUNTER — Ambulatory Visit: Payer: Medicaid Other | Admitting: Podiatry

## 2016-11-26 ENCOUNTER — Telehealth: Payer: Self-pay | Admitting: Family

## 2016-11-26 ENCOUNTER — Ambulatory Visit: Payer: Medicaid Other | Admitting: Family

## 2016-11-26 NOTE — Telephone Encounter (Signed)
Patient did not show for his [facility] appointment on 11/26/16. Will attempt to reschedule.  

## 2016-11-27 ENCOUNTER — Ambulatory Visit: Payer: Medicaid Other | Admitting: Podiatry

## 2016-12-04 ENCOUNTER — Ambulatory Visit: Payer: Medicaid Other | Attending: Family | Admitting: Family

## 2016-12-04 ENCOUNTER — Telehealth: Payer: Self-pay | Admitting: Family

## 2016-12-04 ENCOUNTER — Encounter: Payer: Self-pay | Admitting: Family

## 2016-12-04 VITALS — BP 122/87 | HR 96 | Resp 18 | Ht 67.0 in | Wt 333.4 lb

## 2016-12-04 DIAGNOSIS — K219 Gastro-esophageal reflux disease without esophagitis: Secondary | ICD-10-CM | POA: Insufficient documentation

## 2016-12-04 DIAGNOSIS — E119 Type 2 diabetes mellitus without complications: Secondary | ICD-10-CM

## 2016-12-04 DIAGNOSIS — Z79899 Other long term (current) drug therapy: Secondary | ICD-10-CM | POA: Diagnosis not present

## 2016-12-04 DIAGNOSIS — I5022 Chronic systolic (congestive) heart failure: Secondary | ICD-10-CM | POA: Diagnosis present

## 2016-12-04 DIAGNOSIS — G4733 Obstructive sleep apnea (adult) (pediatric): Secondary | ICD-10-CM | POA: Diagnosis not present

## 2016-12-04 DIAGNOSIS — Z794 Long term (current) use of insulin: Secondary | ICD-10-CM | POA: Diagnosis not present

## 2016-12-04 DIAGNOSIS — E785 Hyperlipidemia, unspecified: Secondary | ICD-10-CM | POA: Diagnosis not present

## 2016-12-04 DIAGNOSIS — I110 Hypertensive heart disease with heart failure: Secondary | ICD-10-CM | POA: Insufficient documentation

## 2016-12-04 DIAGNOSIS — I471 Supraventricular tachycardia: Secondary | ICD-10-CM | POA: Diagnosis not present

## 2016-12-04 DIAGNOSIS — Z7982 Long term (current) use of aspirin: Secondary | ICD-10-CM | POA: Insufficient documentation

## 2016-12-04 DIAGNOSIS — I252 Old myocardial infarction: Secondary | ICD-10-CM | POA: Insufficient documentation

## 2016-12-04 DIAGNOSIS — I10 Essential (primary) hypertension: Secondary | ICD-10-CM

## 2016-12-04 DIAGNOSIS — F17210 Nicotine dependence, cigarettes, uncomplicated: Secondary | ICD-10-CM | POA: Insufficient documentation

## 2016-12-04 DIAGNOSIS — J45909 Unspecified asthma, uncomplicated: Secondary | ICD-10-CM | POA: Diagnosis not present

## 2016-12-04 DIAGNOSIS — Z720 Tobacco use: Secondary | ICD-10-CM

## 2016-12-04 LAB — BASIC METABOLIC PANEL
ANION GAP: 7 (ref 5–15)
BUN: 22 mg/dL — ABNORMAL HIGH (ref 6–20)
CALCIUM: 8.8 mg/dL — AB (ref 8.9–10.3)
CO2: 31 mmol/L (ref 22–32)
CREATININE: 1.07 mg/dL (ref 0.61–1.24)
Chloride: 99 mmol/L — ABNORMAL LOW (ref 101–111)
GLUCOSE: 177 mg/dL — AB (ref 65–99)
Potassium: 5.3 mmol/L — ABNORMAL HIGH (ref 3.5–5.1)
Sodium: 137 mmol/L (ref 135–145)

## 2016-12-04 MED ORDER — METOPROLOL SUCCINATE ER 25 MG PO TB24: 25.0000 mg | ORAL_TABLET | Freq: Every day | ORAL | 5 refills | Status: DC

## 2016-12-04 NOTE — Telephone Encounter (Signed)
Spoke with patient regarding lab work that was drawn today (12/04/16). Renal function looks good but potassium is now 5.3.   Delene Loll has been increased to 49/51mg  and he takes spironolactone and potassium. Advised him to stop his potassium and we will recheck his labs at his next visit on 12/18/16.  Patient verbalized understanding.

## 2016-12-04 NOTE — Progress Notes (Signed)
Patient ID: Oscar [name], male    DOB: [birth year], 41 y.o.   MRN: [id number]  HPI Oscar [name] is a 41 y/o male with a history of obstructive sleep apnea, MI, HTN, hyperlipidemia, GERD, DM, SVT, asthma and chronic heart failure.  Last echo was done 02/06/15 which showed an EF of 25% which is unchanged from previous echo on 12/10/14 which showed an EF of 25% and mild Oscar.   Admitted on 09/12/16 due to pancreatitis possibly due to diuretic. Elevated troponin thought to be due to demand ischemia.  Diuretic was held and patient was discharged home without torsemide. Previous admission was 07/06/16 for chest pain and acute pancreatitis. Cardiology consult was obtained and patient was discharged home. Was in the ED on 03/10/16 with HF exacerbation and was treated and released. Prior admission on 11/22/15 was for a NSTEMI. Cardiology consult obtained during hospitalization and his defibrillator was interrogated. Sleep study was done on 05/06/16 but no results at this time.   He presents today for a follow-up visit with fatigue and shortness of breath with little exertion. Becomes SOB while walking but recovers quickly. Denies symptoms at rest. Denies any swelling in his legs/abdomen. Weight at home is stable at 333 lbs and is down since last visit. Wears oxygen at 2L wearing around the clock. Has CPAP machine. Glucose has been decreasing and he's following with his PCP about this. Does have home health to assess him today and will start visiting him after assessment.   Past Medical History:  Diagnosis Date  . Asthma   . Cardiomyopathy ([place])   . CHF (congestive heart failure) ([place])   . Coronary artery disease   . Deafness in right ear   . Diabetes mellitus without complication ([place])   . Dysrhythmia    svt  . 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 GERD (gastroesophageal reflux disease)   . Hyperlipidemia   . Hypertension   . Hypoxemia   . Myocardial infarction    2012,2014,2/16  . Pancreatitis    . Sleep apnea    osa   Past Surgical History:  Procedure Laterality Date  . CARDIAC CATHETERIZATION  12/11/2014   Procedure: RIGHT/LEFT HEART CATH AND CORONARY ANGIOGRAPHY;  Surgeon: Lorretta Harp, MD;  Location: [facility] CATH LAB;  Service: Cardiovascular;;  . ICD LEAD REMOVAL N/A 03/30/2015   Procedure: ICD LEAD REMOVAL;  Surgeon: Marzetta Board, MD;  Location: [facility] ORS;  Service: Cardiovascular;  Laterality: N/A;  . IMPLANTABLE CARDIOVERTER DEFIBRILLATOR IMPLANT    . INSERT / REPLACE / REMOVE PACEMAKER    . LEFT HEART CATHETERIZATION WITH CORONARY ANGIOGRAM N/A 12/09/2014   Procedure: LEFT HEART CATHETERIZATION WITH CORONARY ANGIOGRAM;  Surgeon: Burnell Blanks, MD;  Location: [facility] CATH LAB;  Service: Cardiovascular;  Laterality: N/A;   Family History  Problem Relation Age of Onset  . Hypertension Mother   . Congestive Heart Failure Mother   . Hypertension Sister   . Diabetes Sister   . Pancreatitis Sister   . COPD Sister   . Pancreatitis Brother   . Anemia Neg Hx   . Arrhythmia Neg Hx   . Asthma Neg Hx   . Clotting disorder Neg Hx   . Fainting Neg Hx   . Heart attack Neg Hx   . Heart disease Neg Hx   . Heart failure Neg Hx   . Hyperlipidemia Neg Hx    Social History  Substance Use Topics  . Smoking status: Current Every  Day Smoker    Packs/day: 0.50    Years: 24.00    Types: Cigarettes  . Smokeless tobacco: Never Used     Comment: 4/5 Smoking 5 cigs a day  . Alcohol use No   Allergies  Allergen Reactions  . Bidil [Isosorb Dinitrate-Hydralazine] Other (See Comments)    Migraine Headache   Prior to Admission medications   Medication Sig Start Date End Date Taking? Authorizing Provider  aspirin EC 81 MG tablet Take 81 mg by mouth daily.   Yes Historical Provider, MD  atorvastatin (LIPITOR) 40 MG tablet Take 1 tablet (40 mg total) by mouth daily at 6 PM. 09/13/16  Yes Nicholes Mango, MD  insulin glargine (LANTUS) 100 UNIT/ML injection Inject 0.1 mLs (10 Units total)  into the skin at bedtime. Patient taking differently: Inject 25 Units into the skin at bedtime.  12/07/15  Yes Alisa Graff, FNP  insulin starter kit- syringes MISC 1 kit by Other route once. 07/10/15  Yes Aldean Jewett, MD  metFORMIN (GLUCOPHAGE) 1000 MG tablet Take 1,000 mg by mouth 2 (two) times daily with a meal.   Yes Historical Provider, MD  omeprazole (PRILOSEC) 20 MG capsule Take 1 capsule (20 mg total) by mouth daily. 04/01/16  Yes Shannon A McGowan, PA-C  polyethylene glycol (MIRALAX / GLYCOLAX) packet Take 17 g by mouth daily.   Yes Historical Provider, MD  potassium chloride SA (K-DUR,KLOR-CON) 20 MEQ tablet Take 1 tablet (20 mEq total) by mouth 2 (two) times daily. Patient taking differently: Take 20 mEq by mouth daily.  04/15/16  Yes Alisa Graff, FNP  sacubitril-valsartan (ENTRESTO) 49-51 MG Take 1 tablet by mouth 2 (two) times daily. 10/29/16  Yes Alisa Graff, FNP  spironolactone (ALDACTONE) 25 MG tablet Take 1 tablet (25 mg total) by mouth daily. 09/19/16 12/04/16 Yes Alisa Graff, FNP  Syringe, Disposable, 1 ML MISC 0.1 mLs by Does not apply route as directed. 08/21/15  Yes Alisa Graff, FNP  tiotropium (SPIRIVA) 18 MCG inhalation capsule Place 18 mcg into inhaler and inhale daily.   Yes Historical Provider, MD  torsemide (DEMADEX) 20 MG tablet Take 20 mg by mouth daily.    Yes Historical Provider, MD  aspirin EC 325 MG tablet Take 1 tablet (325 mg total) by mouth daily. Patient not taking: Reported on 12/04/2016 07/10/15   Aldean Jewett, MD  metoprolol succinate (TOPROL XL) 25 MG 24 hr tablet Take 1 tablet (25 mg total) by mouth daily. 12/04/16   Alisa Graff, FNP    Review of Systems  Constitutional: Positive for appetite change and fatigue.       Appetite as decreased  HENT: Negative for congestion and postnasal drip.        Does have allergies  Eyes: Negative.   Respiratory: Positive for cough and shortness of breath. Negative for chest tightness and wheezing.    Cardiovascular: Negative for chest pain, palpitations and leg swelling.  Gastrointestinal: Negative for nausea and vomiting.  Endocrine: Negative.   Genitourinary: Negative.   Musculoskeletal: Positive for back pain and neck pain.       On occasion  Skin: Negative.   Allergic/Immunologic: Negative.   Neurological: Positive for headaches. Negative for dizziness and light-headedness.  Hematological: Does not bruise/bleed easily.  Psychiatric/Behavioral: Negative for dysphoric mood, sleep disturbance and suicidal ideas. The patient is not nervous/anxious.        Sleeping okay, 6 pillows because when lays down he can't breathe  Physical Exam  Constitutional: He is oriented to person, place, and time. He appears well-developed and well-nourished.  HENT:  Head: Normocephalic and atraumatic.  Eyes: Conjunctivae are normal. Pupils are equal, round, and reactive to light.  Neck: Normal range of motion. Neck supple. No JVD present.  Cardiovascular: Regular rhythm.  Tachycardia present.   Pulmonary/Chest: Effort normal. He has no wheezes. He has no rales.  Abdominal: Soft. He exhibits no distension. There is no tenderness.  Musculoskeletal: He exhibits no edema or tenderness.  Neurological: He is alert and oriented to person, place, and time.  Skin: Skin is warm and dry.  Psychiatric: He has a normal mood and affect. His behavior is normal. Thought content normal.  Nursing note and vitals reviewed.   Vitals:   12/04/16 1035  BP: 122/87  Pulse: 96  Resp: 18  SpO2: 97%  Weight: (!) 333 lb 6 oz (151.2 kg)  Height: 5' 7"  (1.702 m)   Wt Readings from Last 3 Encounters:  12/04/16 (!) 333 lb 6 oz (151.2 kg)  10/29/16 (!) 345 lb (156.5 kg)  10/01/16 (!) 348 lb 2 oz (157.9 kg)    Assessment & Plan:  1: Chronic heart failure with reduced ejection fraction- - NYHA class III - euvolemic today - weighing daily and remains ~333 lbs. Down 12 lbs since last visit (2/28) Reminded to call  for an overnight weight gain of >2 pounds or a weekly weight gain of >5 pounds. - drinking 7 bottles of water a day, recommended 40-50 oz - last saw cardiology [name]) 09/08/16 and returns to him 03/11/17 - needs another ECHO - Not salting foods, using Mrs Deliah Boston - Bidil gives patient migraine headache - Delene Loll was increased at last visit to 49/51 mg BID - patient had stopped taking evening Entresto the last two nights because of headache, asked patient to resume BID Entresto 49/85m and use Tylenol for HA if continues - Added metoprolol succinate 25 mg qd  - Will draw labs today  2: HTN- - BP good today 122/87 mmHg - increased Entresto at last visit - saw PCP ([facility] on 11/12/16, per pt there were no changes  3: Obstructive sleep apnea- - two nights of CPAP machine not working, will be getting it fixed today - Admits to being sluggish today without being able to use his CPAP but less sluggish than baseline - wearing oxygen at 2L around the clock  4: Diabetes - Checking twice daily - FBS 186 today; no lows - A1c 09/12/16 10.9% up from 7.1% 11/22/15 - Patient says he has been eating less and seeing lower numbers recently than around the beginning of the year   5. Tobacco use- - continues to smoke 5 cigarettes daily; was smoking 4 daily at last visit - does remove himself from the oxygen when smoking - has nicotine patches and gum at home, has quit date set for this Saturday - discussed in detail steps to take prior to quit date and how to use patches/gum - Says he has a good support system at home  Patient brought bubble pack of medications ([place] with him today. Each medication was verbally reviewed with the patient.  Return in 2 weeks or sooner for any questions/problems before then.   HDarrow Bussing PharmD Pharmacy Resident 12/04/2016 11:47 AM

## 2016-12-05 ENCOUNTER — Encounter: Payer: Self-pay | Admitting: Family

## 2016-12-08 ENCOUNTER — Ambulatory Visit (INDEPENDENT_AMBULATORY_CARE_PROVIDER_SITE_OTHER): Payer: Medicaid Other | Admitting: Podiatry

## 2016-12-08 ENCOUNTER — Encounter: Payer: Self-pay | Admitting: Podiatry

## 2016-12-08 DIAGNOSIS — B351 Tinea unguium: Secondary | ICD-10-CM

## 2016-12-08 DIAGNOSIS — E119 Type 2 diabetes mellitus without complications: Secondary | ICD-10-CM

## 2016-12-08 DIAGNOSIS — E1149 Type 2 diabetes mellitus with other diabetic neurological complication: Secondary | ICD-10-CM | POA: Diagnosis not present

## 2016-12-08 DIAGNOSIS — M79676 Pain in unspecified toe(s): Secondary | ICD-10-CM | POA: Diagnosis not present

## 2016-12-08 NOTE — Progress Notes (Signed)
Complaint:  Visit Type: Patient comes  to my office for  preventative foot care services. Complaint: Patient states" my nails have grown long and thick and become painful to walk and wear shoes" Patient has been diagnosed with DM with no foot complications. The patient presents for preventative foot care services. No changes to ROS  Podiatric Exam: Vascular: dorsalis pedis and posterior tibial pulses are palpable bilateral. Capillary return is immediate. Temperature gradient is WNL. Skin turgor WNL  Sensorium: Normal Semmes Weinstein monofilament test. Normal tactile sensation bilaterally. Nail Exam: Pt has thick disfigured discolored nails with subungual debris noted bilateral entire nail hallux through fifth toenails Ulcer Exam: There is no evidence of ulcer or pre-ulcerative changes or infection. Orthopedic Exam: Muscle tone and strength are WNL. No limitations in general ROM. No crepitus or effusions noted. Foot type and digits show no abnormalities. Bony prominences are unremarkable. Skin: No Porokeratosis. No infection or ulcers  Diagnosis:  Onychomycosis, , Pain in right toe, pain in left toes  Treatment & Plan Procedures and Treatment: Consent by patient was obtained for treatment procedures. The patient understood the discussion of treatment and procedures well. All questions were answered thoroughly reviewed. Debridement of mycotic and hypertrophic toenails, 1 through 5 bilateral and clearing of subungual debris. No ulceration, no infection noted.  Return Visit-Office Procedure: Patient instructed to return to the office for a follow up visit  10 weeks. for continued evaluation and treatment.    [name] DPM

## 2016-12-18 ENCOUNTER — Ambulatory Visit: Payer: Medicaid Other | Admitting: Family

## 2016-12-18 ENCOUNTER — Telehealth: Payer: Self-pay | Admitting: Family

## 2016-12-18 NOTE — Telephone Encounter (Signed)
Patient missed his appointment at the [facility] on 12/18/16. Will attempt to reschedule.

## 2017-01-06 ENCOUNTER — Ambulatory Visit: Payer: Medicaid Other | Admitting: Family

## 2017-01-09 ENCOUNTER — Ambulatory Visit: Payer: Medicaid Other | Attending: Family | Admitting: Family

## 2017-01-09 ENCOUNTER — Telehealth: Payer: Self-pay

## 2017-01-09 ENCOUNTER — Encounter: Payer: Self-pay | Admitting: Family

## 2017-01-09 VITALS — BP 140/93 | HR 93 | Resp 20 | Ht 67.0 in | Wt 332.5 lb

## 2017-01-09 DIAGNOSIS — K219 Gastro-esophageal reflux disease without esophagitis: Secondary | ICD-10-CM | POA: Diagnosis not present

## 2017-01-09 DIAGNOSIS — G4733 Obstructive sleep apnea (adult) (pediatric): Secondary | ICD-10-CM | POA: Diagnosis not present

## 2017-01-09 DIAGNOSIS — F17210 Nicotine dependence, cigarettes, uncomplicated: Secondary | ICD-10-CM | POA: Insufficient documentation

## 2017-01-09 DIAGNOSIS — Z720 Tobacco use: Secondary | ICD-10-CM

## 2017-01-09 DIAGNOSIS — E119 Type 2 diabetes mellitus without complications: Secondary | ICD-10-CM | POA: Diagnosis not present

## 2017-01-09 DIAGNOSIS — I5022 Chronic systolic (congestive) heart failure: Secondary | ICD-10-CM

## 2017-01-09 DIAGNOSIS — J45909 Unspecified asthma, uncomplicated: Secondary | ICD-10-CM | POA: Insufficient documentation

## 2017-01-09 DIAGNOSIS — Z794 Long term (current) use of insulin: Secondary | ICD-10-CM | POA: Insufficient documentation

## 2017-01-09 DIAGNOSIS — I252 Old myocardial infarction: Secondary | ICD-10-CM | POA: Diagnosis not present

## 2017-01-09 DIAGNOSIS — I110 Hypertensive heart disease with heart failure: Secondary | ICD-10-CM | POA: Diagnosis not present

## 2017-01-09 DIAGNOSIS — Z7982 Long term (current) use of aspirin: Secondary | ICD-10-CM | POA: Diagnosis not present

## 2017-01-09 DIAGNOSIS — I10 Essential (primary) hypertension: Secondary | ICD-10-CM

## 2017-01-09 LAB — BASIC METABOLIC PANEL
Anion gap: 8 (ref 5–15)
BUN: 16 mg/dL (ref 6–20)
CHLORIDE: 97 mmol/L — AB (ref 101–111)
CO2: 30 mmol/L (ref 22–32)
CREATININE: 0.93 mg/dL (ref 0.61–1.24)
Calcium: 8.3 mg/dL — ABNORMAL LOW (ref 8.9–10.3)
GFR calc non Af Amer: >60 mL/min (ref >60)
Glucose, Bld: 221 mg/dL — ABNORMAL HIGH (ref 65–99)
POTASSIUM: 4.5 mmol/L (ref 3.5–5.1)
SODIUM: 135 mmol/L (ref 135–145)

## 2017-01-09 NOTE — Progress Notes (Signed)
Patient ID: Oscar [name], male    DOB: 02-Oct-1975, 41 y.o.   MRN: [id number]  HPI  Oscar [name] is a 41 y/o male with a history of obstructive sleep apnea (currently with CPAP), MI, HTN, hyperlipidemia, GERD, DM, SVT, asthma and chronic heart failure.  Last echo was done 02/06/15 which showed an EF of 25% which is unchanged from previous echo on 12/10/14 which showed an EF of 25% and mild Oscar.   Admitted on 09/12/16 due to pancreatitis possibly due to diuretic. Elevated troponin thought to be due to demand ischemia.  Diuretic was held and patient was discharged home. Previous admission was 07/06/16 for chest pain and acute pancreatitis. Cardiology consult was obtained and patient was discharged home. Was in the ED on 03/10/16 with HF exacerbation and was treated and released. Prior admission on 11/22/15 was for a NSTEMI. Cardiology consult obtained during hospitalization and his defibrillator was interrogated. Sleep study was done on 05/06/16 but no results at this time.   He presents today for a follow-up visit with a chief complaint of mild shortness of breath with moderate exertion. He describes this as chronic in nature and has been present for several years. He has associated fatigue, wheezing and light-headedness with this.   Past Medical History:  Diagnosis Date  . Asthma   . Cardiomyopathy ([place])   . CHF (congestive heart failure) ([place])   . Coronary artery disease   . Deafness in right ear   . Diabetes mellitus without complication ([place])   . Dysrhythmia    svt  . 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 GERD (gastroesophageal reflux disease)   . Hyperlipidemia   . Hypertension   . Hypoxemia   . Myocardial infarction ([place])    [id number],2/70  . Pancreatitis   . Sleep apnea    osa   Past Surgical History:  Procedure Laterality Date  . CARDIAC CATHETERIZATION  12/11/2014   Procedure: RIGHT/LEFT HEART CATH AND CORONARY ANGIOGRAPHY;  Surgeon: Lorretta Harp, MD;  [facility]  CATH LAB;  Service: Cardiovascular;;  . ICD LEAD REMOVAL N/A 03/30/2015   Procedure: ICD LEAD REMOVAL;  Surgeon: Marzetta Board, MD;  Location: [facility] ORS;  Service: Cardiovascular;  Laterality: N/A;  . IMPLANTABLE CARDIOVERTER DEFIBRILLATOR IMPLANT    . INSERT / REPLACE / REMOVE PACEMAKER    . LEFT HEART CATHETERIZATION WITH CORONARY ANGIOGRAM N/A 12/09/2014   Procedure: LEFT HEART CATHETERIZATION WITH CORONARY ANGIOGRAM;  Surgeon: Burnell Blanks, MD;  Location: [facility] CATH LAB;  Service: Cardiovascular;  Laterality: N/A;   Family History  Problem Relation Age of Onset  . Hypertension Mother   . Congestive Heart Failure Mother   . Hypertension Sister   . Diabetes Sister   . Pancreatitis Sister   . COPD Sister   . Pancreatitis Brother   . Anemia Neg Hx   . Arrhythmia Neg Hx   . Asthma Neg Hx   . Clotting disorder Neg Hx   . Fainting Neg Hx   . Heart attack Neg Hx   . Heart disease Neg Hx   . Heart failure Neg Hx   . Hyperlipidemia Neg Hx    Social History  Substance Use Topics  . Smoking status: Current Every Day Smoker    Packs/day: 0.50    Years: 24.00    Types: Cigarettes  . Smokeless tobacco: Never Used     Comment: 4/5 Smoking 5 cigs a day  . Alcohol use No  Allergies  Allergen Reactions  . Bidil [Isosorb Dinitrate-Hydralazine] Other (See Comments)    Migraine Headache    Prior to Admission medications   Medication Sig Start Date End Date Taking? Authorizing Provider  aspirin EC 325 MG tablet Take 1 tablet (325 mg total) by mouth daily. 07/10/15  Yes Aldean Jewett, MD  aspirin EC 81 MG tablet Take 81 mg by mouth daily.   Yes [provider]  atorvastatin (LIPITOR) 40 MG tablet Take 1 tablet (40 mg total) by mouth daily at 6 PM. 09/13/16  Yes Gouru, Aruna, MD  insulin glargine (LANTUS) 100 UNIT/ML injection Inject 0.1 mLs (10 Units total) into the skin at bedtime. Patient taking differently: Inject 25 Units into the skin at bedtime.  12/07/15  Yes  Alisa Graff, FNP  insulin starter kit- syringes MISC 1 kit by Other route once. 07/10/15  Yes Aldean Jewett, MD  metFORMIN (GLUCOPHAGE) 1000 MG tablet Take 1,000 mg by mouth 2 (two) times daily with a meal.   Yes [provider]  metoprolol succinate (TOPROL XL) 25 MG 24 hr tablet Take 1 tablet (25 mg total) by mouth daily. 12/04/16  Yes Darylene Price A, FNP  omeprazole (PRILOSEC) 20 MG capsule Take 1 capsule (20 mg total) by mouth daily. 04/01/16  Yes McGowan, Larene Beach A, PA-C  polyethylene glycol (MIRALAX / GLYCOLAX) packet Take 17 g by mouth daily.   Yes [provider]  sacubitril-valsartan (ENTRESTO) 49-51 MG Take 1 tablet by mouth 2 (two) times daily. 10/29/16  Yes Darylene Price A, FNP  spironolactone (ALDACTONE) 25 MG tablet Take 1 tablet (25 mg total) by mouth daily. 09/19/16 01/09/17 Yes Hackney, Otila Kluver A, FNP  Syringe, Disposable, 1 ML MISC 0.1 mLs by Does not apply route as directed. 08/21/15  Yes Darylene Price A, FNP  tiotropium (SPIRIVA) 18 MCG inhalation capsule Place 18 mcg into inhaler and inhale daily.   Yes [provider]  torsemide (DEMADEX) 20 MG tablet Take 20 mg by mouth daily.    Yes [provider]    Review of Systems  Constitutional: Positive for fatigue. Negative for appetite change.  HENT: Negative for congestion, postnasal drip and sore throat.   Eyes: Negative.   Respiratory: Positive for shortness of breath and wheezing. Negative for chest tightness.   Cardiovascular: Negative for chest pain, palpitations and leg swelling.  Gastrointestinal: Negative for abdominal distention.  Endocrine: Negative.   Genitourinary: Negative.   Musculoskeletal: Negative for arthralgias and back pain.  Skin: Negative.   Allergic/Immunologic: Negative.   Neurological: Positive for light-headedness. Negative for dizziness.  Hematological: Negative for adenopathy. Does not bruise/bleed easily.  Psychiatric/Behavioral: Negative for dysphoric mood,  sleep disturbance (sleeping on 4 pillows) and suicidal ideas. The patient is not nervous/anxious.    Vitals:   01/09/17 1053  BP: (!) 140/93  Pulse: 93  Resp: 20  SpO2: 98%  Weight: (!) 332 lb 8 oz (150.8 kg)  Height: 5' 7"  (1.702 m)   Wt Readings from Last 3 Encounters:  01/09/17 (!) 332 lb 8 oz (150.8 kg)  12/04/16 (!) 333 lb 6 oz (151.2 kg)  10/29/16 (!) 345 lb (156.5 kg)   Lab Results  Component Value Date   CREATININE 1.07 12/04/2016   CREATININE 0.91 09/13/2016   CREATININE 1.08 09/12/2016    Physical Exam  Constitutional: He is oriented to person, place, and time. He appears well-developed and well-nourished.  HENT:  Head: Normocephalic and atraumatic.  Neck: Normal range of motion. Neck  supple. No JVD present.  Cardiovascular: Normal rate and regular rhythm.   Pulmonary/Chest: Effort normal. He has no wheezes. He has no rales.  Abdominal: Soft. He exhibits no distension. There is no tenderness.  Musculoskeletal: He exhibits no edema or tenderness.  Neurological: He is alert and oriented to person, place, and time.  Skin: Skin is warm and dry.  Psychiatric: He has a normal mood and affect. His behavior is normal. Thought content normal.  Nursing note and vitals reviewed.    Assessment & Plan:  1: Chronic heart failure with reduced ejection fraction- - NYHA class II - euvolemic today - weight stable. Reminded to call for an overnight weight gain of >2 pounds or a weekly weight gain of >5 pounds. - tolerated increase in entresto without known side effects. - last saw cardiology [name]) 09/08/16 and returns to him 03/11/17 - could consider adding toprol xl  - bidil gave him migraine headache - BMP drawn today - need to get an echocardiogram ordered  2: HTN- - BP ok today - saw PCP [facility]) on 11/12/16  3: Obstructive sleep apnea- - has been without his CPAP mask for a few days as his seal is broken and insurance will not pay for him to  get another one until 01/12/17 - he has noticed an increase in fatigue since he hasn't been wearing it but has plans to pick up the new one on the 14th - wearing oxygen at 3L now  4: Tobacco use- - continues to smoke 4 cigarettes daily - does remove himself from the oxygen when smoking - complete cessation discussed for 3 minutes with him  Patient did not bring his medications nor a list. Each medication was verbally reviewed with the patient and he was encouraged to bring the bottles to every visit to confirm accuracy of list.  [place] is now coming to patient's home to help with medication management.  Return in 2 months or sooner for any questions/problems before then.

## 2017-01-09 NOTE — Patient Instructions (Signed)
Continue weighing daily and call for an overnight weight gain of > 2 pounds or a weekly weight gain of >5 pounds. 

## 2017-01-12 ENCOUNTER — Other Ambulatory Visit: Payer: Self-pay

## 2017-01-12 DIAGNOSIS — I5022 Chronic systolic (congestive) heart failure: Secondary | ICD-10-CM

## 2017-01-12 NOTE — Addendum Note (Signed)
Addended by: Gaylord Shih on: 01/12/2017 08:58 AM   Modules accepted: Orders

## 2017-01-13 NOTE — Telephone Encounter (Signed)
Contacted Mr. Berling in regards to his labs. Pt informed that labs look good, and no changes are needed at this time. He is to follow up as directed.

## 2017-01-29 ENCOUNTER — Emergency Department: Payer: Medicaid Other

## 2017-01-29 ENCOUNTER — Emergency Department
Admission: EM | Admit: 2017-01-29 | Discharge: 2017-01-29 | Disposition: A | Discharge status: Home / Self Care | Payer: Medicaid Other | Attending: Emergency Medicine | Admitting: Emergency Medicine

## 2017-01-29 DIAGNOSIS — I5022 Chronic systolic (congestive) heart failure: Secondary | ICD-10-CM | POA: Diagnosis not present

## 2017-01-29 DIAGNOSIS — Z794 Long term (current) use of insulin: Secondary | ICD-10-CM | POA: Diagnosis not present

## 2017-01-29 DIAGNOSIS — Z79899 Other long term (current) drug therapy: Secondary | ICD-10-CM | POA: Insufficient documentation

## 2017-01-29 DIAGNOSIS — Z7982 Long term (current) use of aspirin: Secondary | ICD-10-CM | POA: Diagnosis not present

## 2017-01-29 DIAGNOSIS — F17210 Nicotine dependence, cigarettes, uncomplicated: Secondary | ICD-10-CM | POA: Insufficient documentation

## 2017-01-29 DIAGNOSIS — I110 Hypertensive heart disease with heart failure: Secondary | ICD-10-CM | POA: Insufficient documentation

## 2017-01-29 DIAGNOSIS — K859 Acute pancreatitis without necrosis or infection, unspecified: Secondary | ICD-10-CM | POA: Diagnosis not present

## 2017-01-29 DIAGNOSIS — R1010 Upper abdominal pain, unspecified: Secondary | ICD-10-CM | POA: Diagnosis present

## 2017-01-29 DIAGNOSIS — J45909 Unspecified asthma, uncomplicated: Secondary | ICD-10-CM | POA: Insufficient documentation

## 2017-01-29 DIAGNOSIS — R109 Unspecified abdominal pain: Secondary | ICD-10-CM

## 2017-01-29 DIAGNOSIS — E119 Type 2 diabetes mellitus without complications: Secondary | ICD-10-CM | POA: Insufficient documentation

## 2017-01-29 LAB — TROPONIN I: Troponin I: 0.07 ng/mL (ref <0.03)

## 2017-01-29 LAB — CBC
HCT: 43.3 % (ref 40.0–52.0)
Hemoglobin: 15.3 g/dL (ref 13.0–18.0)
MCH: 32.9 pg (ref 26.0–34.0)
MCHC: 35.4 g/dL (ref 32.0–36.0)
MCV: 93 fL (ref 80.0–100.0)
PLATELETS: 186 10*3/uL (ref 150–440)
RBC: 4.66 MIL/uL (ref 4.40–5.90)
RDW: 13.5 % (ref 11.5–14.5)
WBC: 5.1 10*3/uL (ref 3.8–10.6)

## 2017-01-29 LAB — BASIC METABOLIC PANEL
Anion gap: 8 (ref 5–15)
BUN: 20 mg/dL (ref 6–20)
CHLORIDE: 98 mmol/L — AB (ref 101–111)
CO2: 30 mmol/L (ref 22–32)
Calcium: 8.8 mg/dL — ABNORMAL LOW (ref 8.9–10.3)
Creatinine, Ser: 1.03 mg/dL (ref 0.61–1.24)
GFR calc non Af Amer: >60 mL/min (ref >60)
GLUCOSE: 207 mg/dL — AB (ref 65–99)
Potassium: 4.3 mmol/L (ref 3.5–5.1)
Sodium: 136 mmol/L (ref 135–145)

## 2017-01-29 LAB — HEPATIC FUNCTION PANEL
ALT: 40 U/L (ref 17–63)
AST: 37 U/L (ref 15–41)
Albumin: 3.9 g/dL (ref 3.5–5.0)
Alkaline Phosphatase: 221 U/L — ABNORMAL HIGH (ref 38–126)
BILIRUBIN TOTAL: 0.7 mg/dL (ref 0.3–1.2)
Total Protein: 8.3 g/dL — ABNORMAL HIGH (ref 6.5–8.1)

## 2017-01-29 LAB — LIPASE, BLOOD: Lipase: 323 U/L — ABNORMAL HIGH (ref 11–51)

## 2017-01-29 LAB — CHLAMYDIA/NGC RT PCR (ARMC ONLY)
Chlamydia Tr: NOT DETECTED
N gonorrhoeae: NOT DETECTED

## 2017-01-29 MED ORDER — ONDANSETRON 4 MG PO TBDP
8.0000 mg | ORAL_TABLET | Freq: Once | ORAL | Status: AC
  Administered 2017-01-29: 8 mg via ORAL
  Filled 2017-01-29: qty 2

## 2017-01-29 MED ORDER — ONDANSETRON 8 MG PO TBDP
8.0000 mg | ORAL_TABLET | Freq: Three times a day (TID) | ORAL | 0 refills | Status: DC | PRN
Start: 2017-01-29 — End: 2017-08-13

## 2017-01-29 MED ORDER — OXYCODONE-ACETAMINOPHEN 5-325 MG PO TABS
2.0000 | ORAL_TABLET | Freq: Once | ORAL | Status: AC
  Administered 2017-01-29: 2 via ORAL
  Filled 2017-01-29: qty 2

## 2017-01-29 MED ORDER — OXYCODONE-ACETAMINOPHEN 10-325 MG PO TABS: 1.0000 | ORAL_TABLET | Freq: Four times a day (QID) | ORAL | 0 refills | Status: DC | PRN

## 2017-01-29 MED ORDER — MORPHINE SULFATE (PF) 4 MG/ML IV SOLN
6.0000 mg | Freq: Once | INTRAVENOUS | Status: AC
  Administered 2017-01-29: 6 mg via INTRAVENOUS
  Filled 2017-01-29: qty 2

## 2017-01-29 MED ORDER — ONDANSETRON HCL 4 MG/2ML IJ SOLN
4.0000 mg | Freq: Once | INTRAMUSCULAR | Status: AC
Start: 2017-01-29 — End: 2017-01-29
  Administered 2017-01-29: 4 mg via INTRAVENOUS
  Filled 2017-01-29: qty 2

## 2017-01-29 NOTE — ED Triage Notes (Signed)
Pt reports that he has been having abd pain for the last 2 days and that this am he started with chest tightness - c/o shortness of breath but pt is chronic O2 usage at 2 L and reports it is no more than usual - reports nausea but denies vomiting - denies lightheadedness

## 2017-01-29 NOTE — ED Notes (Signed)
Collected CBC, sent to lab.

## 2017-01-29 NOTE — ED Provider Notes (Signed)
[facility] Emergency Department Provider Note  ____________________________________________   First MD Initiated Contact with Patient 01/29/17 1224     (approximate)  I have reviewed the triage vital signs and the nursing notes.   HISTORY  Chief Complaint Chest Pain; Shortness of Breath; and Abdominal Pain  HPI Oscar [name] is a 41 y.o. male who self presents to the emergency department with severe searing upper abdominal pain for the past 2-3 days. Pain is postprandial and improved when not trying to eat. It is associated with nausea. He has a previous history of pancreatitis of unknown etiology. He still has a gallbladder. He does not drink alcohol. He also reports several days of atypical chest pain. He has a past medical history of nonischemic cardiomyopathy with an AICD. It has not fired recently. He most recently had a cardiac catheterization several months ago which was negative.He sleeps on 4 pillows which he says is chronic for him.   Past Medical History:  Diagnosis Date  . Asthma   . Cardiomyopathy ([place])   . CHF (congestive heart failure) ([place])   . Coronary artery disease   . Deafness in right ear   . Diabetes mellitus without complication ([place])   . Dysrhythmia    svt  . 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 GERD (gastroesophageal reflux disease)   . Hyperlipidemia   . Hypertension   . Hypoxemia   . Myocardial infarction ([place])    [id number],2/87  . Pancreatitis   . Sleep apnea    osa    Patient Active Problem List   Diagnosis Date Noted  . Pancreatitis 09/12/2016  . Bronchitis 05/20/2016  . Constipation 11/08/2015  . Abdominal pain 11/02/2015  . Obstructive sleep apnea 07/31/2015  . Tobacco use 07/31/2015  . Diabetes ([place]) 07/10/2015  . Chronic systolic heart failure ([place]) 03/30/2015  . Cardiac pacemaker 03/30/2015  . Chronic respiratory failure with hypercapnia ([place]) 12/12/2014  . Elevated troponin   .  Non-ischemic cardiomyopathy ([place])   . Hypoxemia   . SVT (supraventricular tachycardia) ([place]) 12/09/2014  . NSTEMI (non-ST elevated myocardial infarction) ([place]) 12/09/2014  . Hypertension 12/09/2014    Past Surgical History:  Procedure Laterality Date  . CARDIAC CATHETERIZATION  12/11/2014   Procedure: RIGHT/LEFT HEART CATH AND CORONARY ANGIOGRAPHY;  Surgeon: Lorretta Harp, MD;  Location: [facility] CATH LAB;  Service: Cardiovascular;;  . ICD LEAD REMOVAL N/A 03/30/2015   Procedure: ICD LEAD REMOVAL;  Surgeon: Marzetta Board, MD;  Location: [facility] ORS;  Service: Cardiovascular;  Laterality: N/A;  . IMPLANTABLE CARDIOVERTER DEFIBRILLATOR IMPLANT    . INSERT / REPLACE / REMOVE PACEMAKER    . LEFT HEART CATHETERIZATION WITH CORONARY ANGIOGRAM N/A 12/09/2014   Procedure: LEFT HEART CATHETERIZATION WITH CORONARY ANGIOGRAM;  Surgeon: Burnell Blanks, MD;  Location[facility] CATH LAB;  Service: Cardiovascular;  Laterality: N/A;    Prior to Admission medications   Medication Sig Start Date End Date Taking? Authorizing Provider  aspirin EC 325 MG tablet Take 1 tablet (325 mg total) by mouth daily. 07/10/15  Yes Aldean Jewett, MD  atorvastatin (LIPITOR) 40 MG tablet Take 1 tablet (40 mg total) by mouth daily at 6 PM. 09/13/16  Yes Gouru, Aruna, MD  insulin glargine (LANTUS) 100 UNIT/ML injection Inject 0.1 mLs (10 Units total) into the skin at bedtime. Patient taking differently: Inject 40 Units into the skin at bedtime.  12/07/15  Yes Darylene Price A, FNP  metFORMIN (GLUCOPHAGE) 1000 MG  tablet Take 1,000 mg by mouth 2 (two) times daily with a meal.   Yes [provider]  omeprazole (PRILOSEC) 20 MG capsule Take 1 capsule (20 mg total) by mouth daily. 04/01/16  Yes McGowan, Larene Beach A, PA-C  polyethylene glycol (MIRALAX / GLYCOLAX) packet Take 17 g by mouth daily.   Yes [provider]  sacubitril-valsartan (ENTRESTO) 49-51 MG Take 1 tablet by mouth 2 (two) times daily. 10/29/16  Yes Darylene Price A, FNP  spironolactone (ALDACTONE) 25 MG tablet Take 1 tablet (25 mg total) by mouth daily. 09/19/16 01/29/18 Yes Hackney, Otila Kluver A, FNP  tiotropium (SPIRIVA) 18 MCG inhalation capsule Place 18 mcg into inhaler and inhale daily.   Yes [provider]  torsemide (DEMADEX) 20 MG tablet Take 20 mg by mouth daily.    Yes [provider]  insulin starter kit- syringes MISC 1 kit by Other route once. 07/10/15   Aldean Jewett, MD  metoprolol succinate (TOPROL XL) 25 MG 24 hr tablet Take 1 tablet (25 mg total) by mouth daily. Patient not taking: Reported on 01/29/2017 12/04/16   Alisa Graff, FNP  ondansetron (ZOFRAN ODT) 8 MG disintegrating tablet Take 1 tablet (8 mg total) by mouth every 8 (eight) hours as needed for nausea or vomiting. 01/29/17   Darel Hong, MD  oxyCODONE-acetaminophen (PERCOCET) 10-325 MG tablet Take 1 tablet by mouth every 6 (six) hours as needed for pain. 01/29/17 01/29/18  Darel Hong, MD  Syringe, Disposable, 1 ML MISC 0.1 mLs by Does not apply route as directed. 08/21/15   Alisa Graff, FNP    Allergies Bidil [isosorb dinitrate-hydralazine]  Family History  Problem Relation Age of Onset  . Hypertension Mother   . Congestive Heart Failure Mother   . Hypertension Sister   . Diabetes Sister   . Pancreatitis Sister   . COPD Sister   . Pancreatitis Brother   . Anemia Neg Hx   . Arrhythmia Neg Hx   . Asthma Neg Hx   . Clotting disorder Neg Hx   . Fainting Neg Hx   . Heart attack Neg Hx   . Heart disease Neg Hx   . Heart failure Neg Hx   . Hyperlipidemia Neg Hx     Social History Social History  Substance Use Topics  . Smoking status: Current Every Day Smoker    Packs/day: 0.50    Years: 24.00    Types: Cigarettes  . Smokeless tobacco: Never Used     Comment: 4/5 Smoking 5 cigs a day  . Alcohol use No    Review of Systems Constitutional: No fever/chills Eyes: No visual changes. ENT: No sore throat. Cardiovascular:  positivechest pain. Respiratory: positiveshortness of breath. Gastrointestinal: positiveabdominal pain.  positivenausea, no vomiting.  No diarrhea.  No constipation. Genitourinary: Negative for dysuria. Musculoskeletal: Negative for back pain. Skin: Negative for rash. Neurological: Negative for headaches, focal weakness or numbness.   ____________________________________________   PHYSICAL EXAM:  VITAL SIGNS: ED Triage Vitals  Enc Vitals Group     BP 01/29/17 1116 (!) 150/91     Pulse Rate 01/29/17 1116 94     Resp 01/29/17 1116 (!) 22     Temp 01/29/17 1116 98 F (36.7 C)     Temp Source 01/29/17 1116 Oral     SpO2 01/29/17 1116 96 %     Weight 01/29/17 1123 (!) 331 lb (150.1 kg)     Height 01/29/17 1123 _0  (1.6 m)  Head Circumference --      Peak Flow --      Pain Score 01/29/17 1123 8     Pain Loc --      Pain Edu? --      Excl. in [place]? --     Constitutional: Alert and oriented x 4 well appearing nontoxic no diaphoresis speaks in full, clear sentences Eyes: PERRL EOMI. Head: Atraumatic. Nose: No congestion/rhinnorhea. Mouth/Throat: No trismus Neck: No stridor.   Cardiovascular: Normal rate, regular rhythm. Grossly normal heart sounds.  Good peripheral circulation.Able to lie completely flat with no jugular venous distention Respiratory: Normal respiratory effort.  No retractions. Lungs CTAB and moving good air Gastrointestinal: Morbidly obese soft mild upper abdominal tenderness with negative Murphy's Musculoskeletal: No lower extremity edema   Neurologic:  Normal speech and language. No gross focal neurologic deficits are appreciated. Skin:  Skin is warm, dry and intact. No rash noted. Psychiatric: Mood and affect are normal. Speech and behavior are normal.    ____________________________________________   DIFFERENTIAL  Biliary colic, cholecystitis, pancreatitis, acute coronary syndrome, congestive heart  failure ____________________________________________   LABS (all labs ordered are listed, but only abnormal results are displayed)  Labs Reviewed  BASIC METABOLIC PANEL - Abnormal; Notable for the following:       Result Value   Chloride 98 (*)    Glucose, Bld 207 (*)    Calcium 8.8 (*)    All other components within normal limits  TROPONIN I - Abnormal; Notable for the following:    Troponin I 0.07 (*)    All other components within normal limits  HEPATIC FUNCTION PANEL - Abnormal; Notable for the following:    Total Protein 8.3 (*)    Alkaline Phosphatase 221 (*)    Bilirubin, Direct <0.1 (*)    All other components within normal limits  LIPASE, BLOOD - Abnormal; Notable for the following:    Lipase 323 (*)    All other components within normal limits  CHLAMYDIA/NGC RT PCR ([facility] ONLY)  CBC    Elevated troponin although down from previous likely nonischemic __________________________________________  EKG  ED ECG REPORT I, Darel Hong, the attending physician, personally viewed and interpreted this ECG.  Date: 01/29/2017 Rate: 84 Rhythm: normal sinus rhythm QRS Axis: normal Intervals: Prolonged QTC ST/T Wave abnormalities: Normal repolarization abnormalities with no signs of acute ischemia Conduction Disturbances: Nonspecific intraventricular conduction delay Narrative Interpretation: Abnormal  ____________________________________________  RADIOLOGY  Cardiomegaly on chest x-ray with no signs of pulmonary overload  Ultrasound of the right upper quadrant with no evidence of biliary obstruction ____________________________________________   PROCEDURES  Procedure(s) performed: no  Procedures  Critical Care performed:no  Observation: no ____________________________________________   INITIAL IMPRESSION / ASSESSMENT AND PLAN / ED COURSE  Pertinent labs & imaging results that were available during my care of the patient were reviewed by me and  considered in my medical decision making (see chart for details).  The patient arrives uncomfortable but overall well-appearing. He says this pain feels like his previous pancreatitis so I added on a lipase to his labs which is positive. Ultrasound of the right upper quadrant confirms no biliary obstruction. Unclear etiology of this pancreatitis as the patient does not drink alcohol. I offered the patient inpatient admission for IV pain control and anti-emetics, however he declined stating he felt that he was okay to go home and will slowly restart his diet with fluids himself. I think this is reasonable. He would not receive large volumes of IV  fluids in house anyway secondary to his congestive heart failure. Strict return precautions given. He is discharged home in improved condition.      ____________________________________________   FINAL CLINICAL IMPRESSION(S) / ED DIAGNOSES  Final diagnoses:  Acute pancreatitis, unspecified complication status, unspecified pancreatitis type      NEW MEDICATIONS STARTED DURING THIS VISIT:  Discharge Medication List as of 01/29/2017  2:57 PM    START taking these medications   Details  ondansetron (ZOFRAN ODT) 8 MG disintegrating tablet Take 1 tablet (8 mg total) by mouth every 8 (eight) hours as needed for nausea or vomiting., Starting Thu 01/29/2017, Print    oxyCODONE-acetaminophen (PERCOCET) 10-325 MG tablet Take 1 tablet by mouth every 6 (six) hours as needed for pain., Starting Thu 01/29/2017, Until Fri 01/29/2018, Print         Note:  This document was prepared using Oscar voice recognition software and may include unintentional dictation errors.     Darel Hong, MD 01/30/17 1141

## 2017-01-29 NOTE — Discharge Instructions (Signed)
Please take your pain medication and antinausea medication as needed. Return immediately to the emergency department for any new or worsening symptoms such as worsening pain, if you cannot eat or drink, or for any other concerns. The danger with pancreatitis is that her pain can be so intense that you are unable to keep food down. Sometimes we need to keep people in the hospital for IV medications.  It was a pleasure to take care of you today, and thank you for coming to our emergency department.  If you have any questions or concerns before leaving please ask the nurse to grab me and I'm more than happy to go through your aftercare instructions again.  If you were prescribed any opioid pain medication today such as Norco, Vicodin, Percocet, morphine, hydrocodone, or oxycodone please make sure you do not drive when you are taking this medication as it can alter your ability to drive safely.  If you have any concerns once you are home that you are not improving or are in fact getting worse before you can make it to your follow-up appointment, please do not hesitate to call 911 and come back for further evaluation.  Darel Hong MD  Results for orders placed or performed during the hospital encounter of 81/44/81  Basic metabolic panel  Result Value Ref Range   Sodium 136 135 - 145 mmol/L   Potassium 4.3 3.5 - 5.1 mmol/L   Chloride 98 (L) 101 - 111 mmol/L   CO2 30 22 - 32 mmol/L   Glucose, Bld 207 (H) 65 - 99 mg/dL   BUN 20 6 - 20 mg/dL   Creatinine, Ser 1.03 0.61 - 1.24 mg/dL   Calcium 8.8 (L) 8.9 - 10.3 mg/dL   GFR calc non Af Amer >60 >60 mL/min   GFR calc Af Amer >60 >60 mL/min   Anion gap 8 5 - 15  CBC  Result Value Ref Range   WBC 5.1 3.8 - 10.6 K/uL   RBC 4.66 4.40 - 5.90 MIL/uL   Hemoglobin 15.3 13.0 - 18.0 g/dL   HCT 43.3 40.0 - 52.0 %   MCV 93.0 80.0 - 100.0 fL   MCH 32.9 26.0 - 34.0 pg   MCHC 35.4 32.0 - 36.0 g/dL   RDW 13.5 11.5 - 14.5 %   Platelets 186 150 - 440 K/uL    Troponin I  Result Value Ref Range   Troponin I 0.07 (HH) <0.03 ng/mL  Hepatic function panel  Result Value Ref Range   Total Protein 8.3 (H) 6.5 - 8.1 g/dL   Albumin 3.9 3.5 - 5.0 g/dL   AST 37 15 - 41 U/L   ALT 40 17 - 63 U/L   Alkaline Phosphatase 221 (H) 38 - 126 U/L   Total Bilirubin 0.7 0.3 - 1.2 mg/dL   Bilirubin, Direct <0.1 (L) 0.1 - 0.5 mg/dL   Indirect Bilirubin NOT CALCULATED 0.3 - 0.9 mg/dL  Lipase, blood  Result Value Ref Range   Lipase 323 (H) 11 - 51 U/L   Dg Chest 2 View  Result Date: 01/29/2017 CLINICAL DATA:  Pt reports that he has been having abd pain for the last 2 days and that this am he started with chest tightness - c/o shortness of breath but pt is chronic O2 usage at 2 L and reports it is no more than usual - reports nausea bu.*comment was truncated* EXAM: CHEST  2 VIEW COMPARISON:  07/06/2016 FINDINGS: Dual lead pacer/ AICD device. Midline trachea.  Moderate cardiomegaly. Mediastinal contours otherwise within normal limits. No pleural effusion or pneumothorax. No congestive failure. Clear lungs. IMPRESSION: Cardiomegaly without congestive failure. Electronically Signed   By: Abigail Miyamoto M.D.   On: 01/29/2017 12:11   US Abdomen Limited Ruq  Result Date: 01/29/2017 CLINICAL DATA:  Right upper quadrant pain and nausea x1 week. History of pancreatitis. EXAM: US ABDOMEN LIMITED - RIGHT UPPER QUADRANT COMPARISON:  09/12/2016 FINDINGS: Gallbladder: No gallstones or wall thickening visualized. No sonographic Murphy sign noted by sonographer. Common bile duct: Diameter: Normal at 6 mm Liver: Slight coarsened appearance the liver echotexture nonspecific without focal mass noted nor biliary dilatation seen. The coursing can be seen with changes of fibrosis. Heterogeneity of the liver parenchyma cannot exclude areas of fatty infiltration. IMPRESSION: No acute abnormality within the right upper quadrant. Slight coarsening of hepatic echotexture may be secondary to changes of  subtle fibrosis. The slightly heterogeneous appearance of the liver may be secondary to changes of fatty infiltration. No significant change from prior. Electronically Signed   By: Ashley Royalty M.D.   On: 01/29/2017 13:36

## 2017-01-29 NOTE — ED Notes (Signed)
Elevated troponin reported verbally to Dr Mable Paris of 0.07.

## 2017-02-02 ENCOUNTER — Telehealth: Payer: Self-pay | Admitting: Emergency Medicine

## 2017-02-02 NOTE — Telephone Encounter (Signed)
Patient called asking for test results.  I gave him std results.

## 2017-02-16 ENCOUNTER — Ambulatory Visit: Payer: Medicaid Other | Admitting: Podiatry

## 2017-03-01 ENCOUNTER — Emergency Department
Admission: EM | Admit: 2017-03-01 | Discharge: 2017-03-02 | Disposition: A | Discharge status: Home / Self Care | Payer: Medicaid Other | Attending: Emergency Medicine | Admitting: Emergency Medicine

## 2017-03-01 ENCOUNTER — Encounter: Payer: Self-pay | Admitting: Emergency Medicine

## 2017-03-01 ENCOUNTER — Emergency Department: Payer: Medicaid Other

## 2017-03-01 DIAGNOSIS — I110 Hypertensive heart disease with heart failure: Secondary | ICD-10-CM | POA: Insufficient documentation

## 2017-03-01 DIAGNOSIS — I252 Old myocardial infarction: Secondary | ICD-10-CM | POA: Insufficient documentation

## 2017-03-01 DIAGNOSIS — K1120 Sialoadenitis, unspecified: Secondary | ICD-10-CM | POA: Diagnosis not present

## 2017-03-01 DIAGNOSIS — E119 Type 2 diabetes mellitus without complications: Secondary | ICD-10-CM | POA: Insufficient documentation

## 2017-03-01 DIAGNOSIS — Z950 Presence of cardiac pacemaker: Secondary | ICD-10-CM | POA: Insufficient documentation

## 2017-03-01 DIAGNOSIS — I509 Heart failure, unspecified: Secondary | ICD-10-CM | POA: Insufficient documentation

## 2017-03-01 DIAGNOSIS — K1121 Acute sialoadenitis: Secondary | ICD-10-CM

## 2017-03-01 DIAGNOSIS — J45909 Unspecified asthma, uncomplicated: Secondary | ICD-10-CM | POA: Diagnosis not present

## 2017-03-01 DIAGNOSIS — R600 Localized edema: Secondary | ICD-10-CM | POA: Diagnosis present

## 2017-03-01 DIAGNOSIS — F17210 Nicotine dependence, cigarettes, uncomplicated: Secondary | ICD-10-CM | POA: Diagnosis not present

## 2017-03-01 DIAGNOSIS — I5022 Chronic systolic (congestive) heart failure: Secondary | ICD-10-CM | POA: Insufficient documentation

## 2017-03-01 LAB — CBC WITH DIFFERENTIAL/PLATELET
BASOS PCT: 1 %
Basophils Absolute: 0 10*3/uL (ref 0–0.1)
EOS ABS: 0.2 10*3/uL (ref 0–0.7)
Eosinophils Relative: 2 %
HCT: 40.9 % (ref 40.0–52.0)
Hemoglobin: 14.8 g/dL (ref 13.0–18.0)
LYMPHS ABS: 2 10*3/uL (ref 1.0–3.6)
Lymphocytes Relative: 22 %
MCH: 33.4 pg (ref 26.0–34.0)
MCHC: 36.1 g/dL — ABNORMAL HIGH (ref 32.0–36.0)
MCV: 92.7 fL (ref 80.0–100.0)
Monocytes Absolute: 0.7 10*3/uL (ref 0.2–1.0)
Monocytes Relative: 8 %
Neutro Abs: 6.2 10*3/uL (ref 1.4–6.5)
Neutrophils Relative %: 67 %
PLATELETS: 185 10*3/uL (ref 150–440)
RBC: 4.41 MIL/uL (ref 4.40–5.90)
RDW: 12.8 % (ref 11.5–14.5)
WBC: 9.3 10*3/uL (ref 3.8–10.6)

## 2017-03-01 LAB — COMPREHENSIVE METABOLIC PANEL
ALBUMIN: 3.9 g/dL (ref 3.5–5.0)
ALT: 63 U/L (ref 17–63)
ANION GAP: 7 (ref 5–15)
AST: 45 U/L — ABNORMAL HIGH (ref 15–41)
Alkaline Phosphatase: 244 U/L — ABNORMAL HIGH (ref 38–126)
BUN: 19 mg/dL (ref 6–20)
CHLORIDE: 96 mmol/L — AB (ref 101–111)
CO2: 28 mmol/L (ref 22–32)
CREATININE: 1.05 mg/dL (ref 0.61–1.24)
Calcium: 8.3 mg/dL — ABNORMAL LOW (ref 8.9–10.3)
GFR calc non Af Amer: >60 mL/min (ref >60)
Glucose, Bld: 302 mg/dL — ABNORMAL HIGH (ref 65–99)
Potassium: 4.2 mmol/L (ref 3.5–5.1)
SODIUM: 131 mmol/L — AB (ref 135–145)
Total Bilirubin: 0.7 mg/dL (ref 0.3–1.2)
Total Protein: 8.3 g/dL — ABNORMAL HIGH (ref 6.5–8.1)

## 2017-03-01 MED ORDER — HYDROMORPHONE HCL 1 MG/ML IJ SOLN
0.5000 mg | Freq: Once | INTRAMUSCULAR | Status: AC
  Administered 2017-03-01: 0.5 mg via INTRAVENOUS
  Filled 2017-03-01: qty 1

## 2017-03-01 MED ORDER — DEXAMETHASONE SODIUM PHOSPHATE 10 MG/ML IJ SOLN
10.0000 mg | Freq: Once | INTRAMUSCULAR | Status: AC
  Administered 2017-03-01: 10 mg via INTRAVENOUS
  Filled 2017-03-01: qty 1

## 2017-03-01 MED ORDER — SODIUM CHLORIDE 0.9 % IV SOLN
3.0000 g | Freq: Once | INTRAVENOUS | Status: AC
  Administered 2017-03-01: 3 g via INTRAVENOUS
  Filled 2017-03-01: qty 3

## 2017-03-01 MED ORDER — CLINDAMYCIN HCL 300 MG PO CAPS: 300.0000 mg | ORAL_CAPSULE | Freq: Three times a day (TID) | ORAL | 0 refills | Status: DC

## 2017-03-01 MED ORDER — OXYCODONE-ACETAMINOPHEN 5-325 MG PO TABS: 1.0000 | ORAL_TABLET | Freq: Four times a day (QID) | ORAL | 0 refills | Status: DC | PRN

## 2017-03-01 MED ORDER — IOPAMIDOL (ISOVUE-300) INJECTION 61%
75.0000 mL | Freq: Once | INTRAVENOUS | Status: AC | PRN
  Administered 2017-03-01: 75 mL via INTRAVENOUS

## 2017-03-01 NOTE — ED Notes (Signed)
Patient transported to CT 

## 2017-03-01 NOTE — ED Triage Notes (Signed)
Pt states that he had right sided facial swelling as of yesterday that has not gotten better. Pt denies injury or trauma of any kind. Pt reports no dental pain and is able to maintain control of saliva at this time.

## 2017-03-01 NOTE — ED Provider Notes (Signed)
[facility] Emergency Department Provider Note       Time seen: ----------------------------------------- 10:48 PM on 03/01/2017 -----------------------------------------     I have reviewed the triage vital signs and the nursing notes.   HISTORY   Chief Complaint Facial Swelling    HPI Oscar [name] is a 41 y.o. male who presents to the ED for right-sided facial swelling that began yesterday. Patient states the swelling has not gotten any better, he denies any injury or trauma, denies fevers or chills. Patient is not had any dental pain and reports he can swallow. Pain is 9 out of 10 on the right side of his face, nothing makes his pain better or worse.   Past Medical History:  Diagnosis Date  . Asthma   . Cardiomyopathy ([place])   . CHF (congestive heart failure) ([place])   . Coronary artery disease   . Deafness in right ear   . Diabetes mellitus without complication ([place])   . Dysrhythmia    svt  . 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 GERD (gastroesophageal reflux disease)   . Hyperlipidemia   . Hypertension   . Hypoxemia   . Myocardial infarction ([place])    [id number],[id number],5/91  . Pancreatitis   . Sleep apnea    osa    Patient Active Problem List   Diagnosis Date Noted  . Pancreatitis 09/12/2016  . Bronchitis 05/20/2016  . Constipation 11/08/2015  . Abdominal pain 11/02/2015  . Obstructive sleep apnea 07/31/2015  . Tobacco use 07/31/2015  . Diabetes ([place]) 07/10/2015  . Chronic systolic heart failure ([place]) 03/30/2015  . Cardiac pacemaker 03/30/2015  . Chronic respiratory failure with hypercapnia ([place]) 12/12/2014  . Elevated troponin   . Non-ischemic cardiomyopathy ([place])   . Hypoxemia   . SVT (supraventricular tachycardia) ([place]) 12/09/2014  . NSTEMI (non-ST elevated myocardial infarction) ([place]) 12/09/2014  . Hypertension 12/09/2014    Past Surgical History:  Procedure Laterality Date  . CARDIAC CATHETERIZATION  12/11/2014   Procedure: RIGHT/LEFT HEART CATH AND CORONARY ANGIOGRAPHY;  Surgeon: Lorretta Harp, MD;  Location[facility] CATH LAB;  Service: Cardiovascular;;  . ICD LEAD REMOVAL N/A 03/30/2015   Procedure: ICD LEAD REMOVAL;  Surgeon: Marzetta Board, MD;  Location: [facility] ORS;  Service: Cardiovascular;  Laterality: N/A;  . IMPLANTABLE CARDIOVERTER DEFIBRILLATOR IMPLANT    . INSERT / REPLACE / REMOVE PACEMAKER    . LEFT HEART CATHETERIZATION WITH CORONARY ANGIOGRAM N/A 12/09/2014   Procedure: LEFT HEART CATHETERIZATION WITH CORONARY ANGIOGRAM;  Surgeon: Burnell Blanks, MD;  Location: [facility] C[facility] LAB;  Service: Cardiovascular;  Laterality: N/A;    Allergies Bidil [isosorb dinitrate-hydralazine]  Social History Social History  Substance Use Topics  . Smoking status: Current Every Day Smoker    Packs/day: 0.50    Years: 24.00    Types: Cigarettes  . Smokeless tobacco: Never Used     Comment: 4/5 Smoking 5 cigs a day  . Alcohol use No    Review of Systems Constitutional: Negative for fever. Eyes: Negative for vision changes ENT:  Negative for congestion, sore throat. Positive for right-sided facial pain and tenderness Cardiovascular: Negative for chest pain. Respiratory: Negative for shortness of breath. Gastrointestinal: Negative for abdominal pain, vomiting and diarrhea. Genitourinary: Negative for dysuria. Musculoskeletal: Negative for back pain. Skin: Negative for rash. Neurological: Negative for headaches, focal weakness or numbness.  All systems negative/normal/unremarkable except as stated in the HPI  ____________________________________________   PHYSICAL EXAM:  VITAL SIGNS: ED Triage  Vitals  Enc Vitals Group     BP 03/01/17 2135 (!) 161/93     Pulse Rate 03/01/17 2135 100     Resp --      Temp 03/01/17 2135 99.2 F (37.3 C)     Temp Source 03/01/17 2135 Oral     SpO2 03/01/17 2135 96 %     Weight 03/01/17 2137 (!) 333 lb (151 kg)     Height 03/01/17 2137 5\' 6"  (1.676 m)      Head Circumference --      Peak Flow --      Pain Score 03/01/17 2135 9     Pain Loc --      Pain Edu? --      Excl. in [place]? --     Constitutional: Alert and oriented. Well appearing and in no distress. Eyes: Conjunctivae are normal. Normal extraocular movements. ENT   Head: Normocephalic and atraumatic.   Nose: No congestion/rhinnorhea.   Mouth/Throat: Mucous membranes are moist. Enlarged and tender right parotid gland. Intraorally I cannot express any purulence from Stensen's duct   Neck: No stridor. Floor the mouth is soft, no adenopathy Cardiovascular: Normal rate, regular rhythm. No murmurs, rubs, or gallops. Respiratory: Normal respiratory effort without tachypnea nor retractions. Breath sounds are clear and equal bilaterally. No wheezes/rales/rhonchi. Gastrointestinal: Soft and nontender. Normal bowel sounds Musculoskeletal: Nontender with normal range of motion in extremities. No lower extremity tenderness nor edema. Neurologic:  Normal speech and language. No gross focal neurologic deficits are appreciated.  Skin:  Skin is warm, dry and intact. No rash noted. Psychiatric: Mood and affect are normal. Speech and behavior are normal.  ____________________________________________  ED COURSE:  Pertinent labs & imaging results that were available during my care of the patient were reviewed by me and considered in my medical decision making (see chart for details). Patient presents for parotitis, we will assess with labs and imaging as indicated.   Procedures ____________________________________________   LABS (pertinent positives/negatives)  Labs Reviewed  CBC WITH DIFFERENTIAL/PLATELET - Abnormal; Notable for the following:       Result Value   MCHC 36.1 (*)    All other components within normal limits  COMPREHENSIVE METABOLIC PANEL - Abnormal; Notable for the following:    Sodium 131 (*)    Chloride 96 (*)    Glucose, Bld 302 (*)    Calcium 8.3 (*)    Total  Protein 8.3 (*)    AST 45 (*)    Alkaline Phosphatase 244 (*)    All other components within normal limits    RADIOLOGY  CT maxillofacial with contrast Is pending ____________________________________________  FINAL ASSESSMENT AND PLAN  Parotitis  Plan: Patient's labs and imaging were dictated above. Patient had presented for parotitis. I have ordered IV antibiotics and disposition will be determined by CT imaging.   Earleen Newport, MD   Note: This note was generated in part or whole with voice recognition software. Voice recognition is usually quite accurate but there are transcription errors that can and very often do occur. I apologize for any typographical errors that were not detected and corrected.     Earleen Newport, MD 03/01/17 2250

## 2017-03-02 NOTE — ED Provider Notes (Signed)
Signout from Dr. Jimmye Norman this 41 year old male with likely right otitis. Pending CT scan to rule out abscess. Already given Unasyn. No airway compromise.  Physical Exam  BP 131/90   Pulse 98   Temp 99.2 F (37.3 C) (Oral)   Resp (!) 21   Ht 5\' 6"  (1.676 m)   Wt (!) 151 kg (333 lb)   SpO2 97%   BMI 53.75 kg/m  ----------------------------------------- 12:45 AM on 03/02/2017 -----------------------------------------   Physical Exam Patient controlling his secretions and speaking with a normal voice this time. Swelling to the right parotid gland with tenderness to palpation and no fluctuance. He requested that I looked in his ear and the ear on the right side appears normal with a mild amount of cerumen but a normal tympanic membrane. ED Course  Procedures  MDM CAT scan with peritonitis but without any abscess.  I discussed the diagnosis as well as treatment plan with the patient. He is understanding willing to comply. Will be discharged home with antibiotics as well as pain control.       Orbie Pyo, MD 03/02/17 [phone]

## 2017-03-05 ENCOUNTER — Ambulatory Visit (INDEPENDENT_AMBULATORY_CARE_PROVIDER_SITE_OTHER): Payer: Medicaid Other | Admitting: Podiatry

## 2017-03-05 ENCOUNTER — Encounter: Payer: Self-pay | Admitting: Podiatry

## 2017-03-05 DIAGNOSIS — M79676 Pain in unspecified toe(s): Secondary | ICD-10-CM | POA: Diagnosis not present

## 2017-03-05 DIAGNOSIS — B351 Tinea unguium: Secondary | ICD-10-CM | POA: Diagnosis not present

## 2017-03-05 DIAGNOSIS — E119 Type 2 diabetes mellitus without complications: Secondary | ICD-10-CM

## 2017-03-05 NOTE — Progress Notes (Signed)
Complaint:  Visit Type: Patient returns to my office for continued preventative foot care services. Complaint: Patient states" my nails have grown long and thick and become painful to walk and wear shoes" Patient has been diagnosed with DM with no foot complications. The patient presents for preventative foot care services. No changes to ROS  Podiatric Exam: Vascular: dorsalis pedis and posterior tibial pulses are palpable bilateral. Capillary return is immediate. Temperature gradient is WNL. Skin turgor WNL  Sensorium: Normal Semmes Weinstein monofilament test. Normal tactile sensation bilaterally. Nail Exam: Pt has thick disfigured discolored nails with subungual debris noted bilateral entire nail hallux through fifth toenails Ulcer Exam: There is no evidence of ulcer or pre-ulcerative changes or infection. Orthopedic Exam: Muscle tone and strength are WNL. No limitations in general ROM. No crepitus or effusions noted. Foot type and digits show no abnormalities. Bony prominences are unremarkable. Skin: No Porokeratosis. No infection or ulcers  Diagnosis:  Onychomycosis, , Pain in right toe, pain in left toes  Treatment & Plan Procedures and Treatment: Consent by patient was obtained for treatment procedures. The patient understood the discussion of treatment and procedures well. All questions were answered thoroughly reviewed. Debridement of mycotic and hypertrophic toenails, 1 through 5 bilateral and clearing of subungual debris. No ulceration, no infection noted.  Return Visit-Office Procedure: Patient instructed to return to the office for a follow up visit 3 months for continued evaluation and treatment.    Rithy Mandley DPM 

## 2017-03-23 ENCOUNTER — Encounter: Payer: Self-pay | Admitting: *Deleted

## 2017-03-23 ENCOUNTER — Emergency Department
Admission: EM | Admit: 2017-03-23 | Discharge: 2017-03-23 | Disposition: A | Discharge status: Home / Self Care | Payer: Medicaid Other | Attending: Emergency Medicine | Admitting: Emergency Medicine

## 2017-03-23 ENCOUNTER — Ambulatory Visit: Payer: Medicaid Other | Admitting: Family

## 2017-03-23 DIAGNOSIS — I5022 Chronic systolic (congestive) heart failure: Secondary | ICD-10-CM | POA: Diagnosis not present

## 2017-03-23 DIAGNOSIS — I2510 Atherosclerotic heart disease of native coronary artery without angina pectoris: Secondary | ICD-10-CM | POA: Diagnosis not present

## 2017-03-23 DIAGNOSIS — F17210 Nicotine dependence, cigarettes, uncomplicated: Secondary | ICD-10-CM | POA: Diagnosis not present

## 2017-03-23 DIAGNOSIS — Z950 Presence of cardiac pacemaker: Secondary | ICD-10-CM | POA: Diagnosis not present

## 2017-03-23 DIAGNOSIS — J45909 Unspecified asthma, uncomplicated: Secondary | ICD-10-CM | POA: Diagnosis not present

## 2017-03-23 DIAGNOSIS — E119 Type 2 diabetes mellitus without complications: Secondary | ICD-10-CM | POA: Insufficient documentation

## 2017-03-23 DIAGNOSIS — Z7982 Long term (current) use of aspirin: Secondary | ICD-10-CM | POA: Diagnosis not present

## 2017-03-23 DIAGNOSIS — Z7984 Long term (current) use of oral hypoglycemic drugs: Secondary | ICD-10-CM | POA: Diagnosis not present

## 2017-03-23 DIAGNOSIS — I252 Old myocardial infarction: Secondary | ICD-10-CM | POA: Insufficient documentation

## 2017-03-23 DIAGNOSIS — I110 Hypertensive heart disease with heart failure: Secondary | ICD-10-CM | POA: Diagnosis not present

## 2017-03-23 DIAGNOSIS — K8500 Idiopathic acute pancreatitis without necrosis or infection: Secondary | ICD-10-CM | POA: Insufficient documentation

## 2017-03-23 DIAGNOSIS — Z79899 Other long term (current) drug therapy: Secondary | ICD-10-CM | POA: Diagnosis not present

## 2017-03-23 DIAGNOSIS — Z794 Long term (current) use of insulin: Secondary | ICD-10-CM | POA: Diagnosis not present

## 2017-03-23 DIAGNOSIS — R1013 Epigastric pain: Secondary | ICD-10-CM | POA: Diagnosis present

## 2017-03-23 LAB — COMPREHENSIVE METABOLIC PANEL
ALBUMIN: 3.8 g/dL (ref 3.5–5.0)
ALT: 46 U/L (ref 17–63)
ANION GAP: 9 (ref 5–15)
AST: 51 U/L — ABNORMAL HIGH (ref 15–41)
Alkaline Phosphatase: 234 U/L — ABNORMAL HIGH (ref 38–126)
BILIRUBIN TOTAL: 0.5 mg/dL (ref 0.3–1.2)
BUN: 12 mg/dL (ref 6–20)
CO2: 24 mmol/L (ref 22–32)
Calcium: 8.6 mg/dL — ABNORMAL LOW (ref 8.9–10.3)
Chloride: 102 mmol/L (ref 101–111)
Creatinine, Ser: 1.04 mg/dL (ref 0.61–1.24)
GFR calc Af Amer: >60 mL/min (ref >60)
GFR calc non Af Amer: >60 mL/min (ref >60)
GLUCOSE: 354 mg/dL — AB (ref 65–99)
POTASSIUM: 3.7 mmol/L (ref 3.5–5.1)
Sodium: 135 mmol/L (ref 135–145)
TOTAL PROTEIN: 8.2 g/dL — AB (ref 6.5–8.1)

## 2017-03-23 LAB — CBC
HEMATOCRIT: 40.1 % (ref 40.0–52.0)
HEMOGLOBIN: 14.4 g/dL (ref 13.0–18.0)
MCH: 33.4 pg (ref 26.0–34.0)
MCHC: 36 g/dL (ref 32.0–36.0)
MCV: 92.7 fL (ref 80.0–100.0)
Platelets: 186 10*3/uL (ref 150–440)
RBC: 4.32 MIL/uL — ABNORMAL LOW (ref 4.40–5.90)
RDW: 12.9 % (ref 11.5–14.5)
WBC: 5.5 10*3/uL (ref 3.8–10.6)

## 2017-03-23 LAB — GLUCOSE, CAPILLARY: Glucose-Capillary: 217 mg/dL — ABNORMAL HIGH (ref 65–99)

## 2017-03-23 LAB — LIPASE, BLOOD: LIPASE: 89 U/L — AB (ref 11–51)

## 2017-03-23 MED ORDER — MORPHINE SULFATE (PF) 4 MG/ML IV SOLN
4.0000 mg | Freq: Once | INTRAVENOUS | Status: AC
  Administered 2017-03-23: 4 mg via INTRAVENOUS
  Filled 2017-03-23: qty 1

## 2017-03-23 MED ORDER — OXYCODONE HCL 5 MG PO TABS
5.0000 mg | ORAL_TABLET | Freq: Once | ORAL | Status: AC
  Administered 2017-03-23: 5 mg via ORAL
  Filled 2017-03-23: qty 1

## 2017-03-23 MED ORDER — ONDANSETRON HCL 4 MG PO TABS: 4.0000 mg | ORAL_TABLET | Freq: Three times a day (TID) | ORAL | 0 refills | Status: DC | PRN

## 2017-03-23 MED ORDER — OXYCODONE-ACETAMINOPHEN 5-325 MG PO TABS: 1.0000 | ORAL_TABLET | Freq: Four times a day (QID) | ORAL | 0 refills | Status: DC | PRN

## 2017-03-23 MED ORDER — ONDANSETRON HCL 4 MG/2ML IJ SOLN
4.0000 mg | Freq: Once | INTRAMUSCULAR | Status: AC
  Administered 2017-03-23: 4 mg via INTRAVENOUS
  Filled 2017-03-23: qty 2

## 2017-03-23 MED ORDER — INSULIN ASPART 100 UNIT/ML ~~LOC~~ SOLN
10.0000 [IU] | Freq: Once | SUBCUTANEOUS | Status: AC
  Administered 2017-03-23: 10 [IU] via SUBCUTANEOUS
  Filled 2017-03-23: qty 1

## 2017-03-23 MED ORDER — SODIUM CHLORIDE 0.9 % IV BOLUS (SEPSIS)
1000.0000 mL | Freq: Once | INTRAVENOUS | Status: AC
  Administered 2017-03-23: 1000 mL via INTRAVENOUS

## 2017-03-23 MED ORDER — ACETAMINOPHEN 500 MG PO TABS
1000.0000 mg | ORAL_TABLET | Freq: Once | ORAL | Status: AC
  Administered 2017-03-23: 1000 mg via ORAL
  Filled 2017-03-23: qty 2

## 2017-03-23 NOTE — ED Triage Notes (Signed)
States RUQ abd pain and cramping for several weeks, states he was seen in ED for facial swelling and given abx and then pain seemed to get worse, denies any nausea or vomiitng, pt on 2L [place] with hx of COPD

## 2017-03-23 NOTE — ED Notes (Signed)
Pt given water and crackers as PO challenge at this time

## 2017-03-23 NOTE — ED Notes (Signed)
ED Provider at bedside. 

## 2017-03-23 NOTE — ED Notes (Signed)
cbg 217, MD aware

## 2017-03-23 NOTE — Discharge Instructions (Signed)
Take pain and nausea medication as prescribed. Slowly advance your diet starting with clear fluids and advancing every 24 hours as tolerated. Return to the emergency room if you have a fever, if your pain is not well controlled, if your nausea and vomiting are not well controlled, or if you developed any new symptoms that are not present today.

## 2017-03-23 NOTE — ED Provider Notes (Signed)
[facility] Department Provider Note  ____________________________________________  Time seen: Approximately 11:52 AM  I have reviewed the triage vital signs and the nursing notes.   HISTORY  Chief Complaint Abdominal Pain   HPI Oscar [name] is a 41 y.o. male with a history of CHF, CAD, diabetes, hypertension, hyperlipidemia, pancreatitis who presents for evaluation of abdominal pain.Patient reports 2-3 weeks of constant epigastric dull abdominal pain that is worse postprandially associated with nausea. No vomiting, no diarrhea, no fever, no chills, no chest pain or shortness of breath, no dysuria or hematuria. Patient reports that the pain is similar to prior episodes of pancreatitis. Currently 6/10.  He is not a drinker. He was seen here 2 months ago with a negative right upper quadrant ultrasound with no evidence of gallstones. He has an appointment to follow-up with the GI in 10 days. He denies unintentional weight loss or family history of pancreatic cancer.  Past Medical History:  Diagnosis Date  . Asthma   . Cardiomyopathy ([place])   . CHF (congestive heart failure) ([place])   . Coronary artery disease   . Deafness in right ear   . Diabetes mellitus without complication ([place])   . Dysrhythmia    svt  . 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 GERD (gastroesophageal reflux disease)   . Hyperlipidemia   . Hypertension   . Hypoxemia   . Myocardial infarction ([place])    [id number],0/94  . Pancreatitis   . Sleep apnea    osa    Patient Active Problem List   Diagnosis Date Noted  . Pancreatitis 09/12/2016  . Bronchitis 05/20/2016  . Constipation 11/08/2015  . Abdominal pain 11/02/2015  . Obstructive sleep apnea 07/31/2015  . Tobacco use 07/31/2015  . Diabetes ([place]) 07/10/2015  . Chronic systolic heart failure ([place]) 03/30/2015  . Cardiac pacemaker 03/30/2015  . Chronic respiratory failure with hypercapnia ([place]) 12/12/2014  .  Elevated troponin   . Non-ischemic cardiomyopathy ([place])   . Hypoxemia   . SVT (supraventricular tachycardia) ([place]) 12/09/2014  . NSTEMI (non-ST elevated myocardial infarction) ([place]) 12/09/2014  . Hypertension 12/09/2014    Past Surgical History:  Procedure Laterality Date  . CARDIAC CATHETERIZATION  12/11/2014   Procedure: RIGHT/LEFT HEART CATH AND CORONARY ANGIOGRAPHY;  Surgeon: Lorretta Harp, MD;  Location: [facility] CATH LAB;  Service: Cardiovascular;;  . ICD LEAD REMOVAL N/A 03/30/2015   Procedure: ICD LEAD REMOVAL;  Surgeon: Marzetta Board, MD;  Location: [facility] ORS;  Service: Cardiovascular;  Laterality: N/A;  . IMPLANTABLE CARDIOVERTER DEFIBRILLATOR IMPLANT    . INSERT / REPLACE / REMOVE PACEMAKER    . LEFT HEART CATHETERIZATION WITH CORONARY ANGIOGRAM N/A 12/09/2014   Procedure: LEFT HEART CATHETERIZATION WITH CORONARY ANGIOGRAM;  Surgeon: Burnell Blanks, MD;  Location: [facility] CATH LAB;  Service: Cardiovascular;  Laterality: N/A;    Prior to Admission medications   Medication Sig Start Date End Date Taking? Authorizing Provider  aspirin EC 325 MG tablet Take 1 tablet (325 mg total) by mouth daily. 07/10/15  Yes Aldean Jewett, MD  atorvastatin (LIPITOR) 40 MG tablet Take 1 tablet (40 mg total) by mouth daily at 6 PM. 09/13/16  Yes Gouru, Aruna, MD  insulin glargine (LANTUS) 100 UNIT/ML injection Inject 0.1 mLs (10 Units total) into the skin at bedtime. Patient taking differently: Inject 50 Units into the skin at bedtime.  12/07/15  Yes Darylene Price A, FNP  metFORMIN (GLUCOPHAGE) 1000 MG tablet Take 1,000 mg  by mouth 2 (two) times daily with a meal.   Yes [provider]  metoprolol succinate (TOPROL XL) 25 MG 24 hr tablet Take 1 tablet (25 mg total) by mouth daily. 12/04/16  Yes Darylene Price A, FNP  omeprazole (PRILOSEC) 20 MG capsule Take 1 capsule (20 mg total) by mouth daily. 04/01/16  Yes McGowan, Larene Beach A, PA-C  polyethylene glycol (MIRALAX / GLYCOLAX) packet Take 17 g by  mouth daily.   Yes [provider]  sacubitril-valsartan (ENTRESTO) 49-51 MG Take 1 tablet by mouth 2 (two) times daily. 10/29/16  Yes Darylene Price A, FNP  spironolactone (ALDACTONE) 25 MG tablet Take 1 tablet (25 mg total) by mouth daily. 09/19/16 01/29/18 Yes Hackney, Otila Kluver A, FNP  tiotropium (SPIRIVA) 18 MCG inhalation capsule Place 18 mcg into inhaler and inhale daily.   Yes [provider]  insulin starter kit- syringes MISC 1 kit by Other route once. 07/10/15   Aldean Jewett, MD  ondansetron (ZOFRAN ODT) 8 MG disintegrating tablet Take 1 tablet (8 mg total) by mouth every 8 (eight) hours as needed for nausea or vomiting. Patient not taking: Reported on 03/23/2017 01/29/17   Darel Hong, MD  ondansetron (ZOFRAN) 4 MG tablet Take 1 tablet (4 mg total) by mouth every 8 (eight) hours as needed for nausea or vomiting. 03/23/17   Alfred Levins, [place], MD  oxyCODONE-acetaminophen (ROXICET) 5-325 MG tablet Take 1 tablet by mouth every 6 (six) hours as needed. 03/23/17 03/23/18  Rudene Re, MD  Syringe, Disposable, 1 ML MISC 0.1 mLs by Does not apply route as directed. 08/21/15   Alisa Graff, FNP  torsemide (DEMADEX) 20 MG tablet Take 20 mg by mouth daily.     [provider]    Allergies Bidil [isosorb dinitrate-hydralazine]  Family History  Problem Relation Age of Onset  . Hypertension Mother   . Congestive Heart Failure Mother   . Hypertension Sister   . Diabetes Sister   . Pancreatitis Sister   . COPD Sister   . Pancreatitis Brother   . Anemia Neg Hx   . Arrhythmia Neg Hx   . Asthma Neg Hx   . Clotting disorder Neg Hx   . Fainting Neg Hx   . Heart attack Neg Hx   . Heart disease Neg Hx   . Heart failure Neg Hx   . Hyperlipidemia Neg Hx     Social History Social History  Substance Use Topics  . Smoking status: Current Every Day Smoker    Packs/day: 0.50    Years: 24.00    Types: Cigarettes  . Smokeless tobacco: Never Used     Comment:  4/5 Smoking 5 cigs a day  . Alcohol use No    Review of Systems  Constitutional: Negative for fever. Eyes: Negative for visual changes. ENT: Negative for sore throat. Neck: No neck pain  Cardiovascular: Negative for chest pain. Respiratory: Negative for shortness of breath. Gastrointestinal: + epigastric abdominal pain and nausea. No vomiting or diarrhea. Genitourinary: Negative for dysuria. Musculoskeletal: Negative for back pain. Skin: Negative for rash. Neurological: Negative for headaches, weakness or numbness. Psych: No SI or HI  ____________________________________________   PHYSICAL EXAM:  VITAL SIGNS: ED Triage Vitals  Enc Vitals Group     BP 03/23/17 1038 124/89     Pulse Rate 03/23/17 1038 83     Resp 03/23/17 1038 18     Temp 03/23/17 1038 98 F (36.7 C)     Temp Source 03/23/17 1038 Oral  SpO2 03/23/17 1038 92 %     Weight 03/23/17 1038 (!) 333 lb (151 kg)     Height 03/23/17 1038 _0  (1.676 m)     Head Circumference --      Peak Flow --      Pain Score 03/23/17 1037 6     Pain Loc --      Pain Edu? --      Excl. in [place]? --     Constitutional: Alert and oriented. Well appearing and in no apparent distress. HEENT:      Head: Normocephalic and atraumatic.         Eyes: Conjunctivae are normal. Sclera is non-icteric.       Mouth/Throat: Mucous membranes are moist.       Neck: Supple with no signs of meningismus. Cardiovascular: Regular rate and rhythm. No murmurs, gallops, or rubs. 2+ symmetrical distal pulses are present in all extremities. No JVD. Respiratory: Normal respiratory effort. Lungs are clear to auscultation bilaterally. No wheezes, crackles, or rhonchi.  Gastrointestinal: Soft, ttp over the epigastric region and RUQ, negative Murphy's sign, non distended with positive bowel sounds. No rebound or guarding. Musculoskeletal: Nontender with normal range of motion in all extremities. No edema, cyanosis, or erythema of extremities. Neurologic:  Normal speech and language. Face is symmetric. Moving all extremities. No gross focal neurologic deficits are appreciated. Skin: Skin is warm, dry and intact. No rash noted. Psychiatric: Mood and affect are normal. Speech and behavior are normal.  ____________________________________________   LABS (all labs ordered are listed, but only abnormal results are displayed)  Labs Reviewed  LIPASE, BLOOD - Abnormal; Notable for the following:       Result Value   Lipase 89 (*)    All other components within normal limits  COMPREHENSIVE METABOLIC PANEL - Abnormal; Notable for the following:    Glucose, Bld 354 (*)    Calcium 8.6 (*)    Total Protein 8.2 (*)    AST 51 (*)    Alkaline Phosphatase 234 (*)    All other components within normal limits  CBC - Abnormal; Notable for the following:    RBC 4.32 (*)    All other components within normal limits  URINALYSIS, COMPLETE (UACMP) WITH MICROSCOPIC  CBG MONITORING, ED   ____________________________________________  EKG  ED ECG REPORT I, Rudene Re, the attending physician, personally viewed and interpreted this ECG.  Normal sinus rhythm, rate of 92, LBBB, LVH, LAD, normal QTc, STE in V1-V2 with T wave inversion in I and aVL. Unchanged from prior from June 2018  ____________________________________________  RADIOLOGY  none  ____________________________________________   PROCEDURES  Procedure(s) performed: None Procedures Critical Care performed:  None ____________________________________________   INITIAL IMPRESSION / ASSESSMENT AND PLAN / ED COURSE  41 y.o. male with a history of CHF, CAD, diabetes, hypertension, hyperlipidemia, pancreatitis who presents for evaluation of several weeks of epigastric abdominal pain worse post-prandially associated with nausea. Patient has had several episodes of pancreatitis dating back to November 2017 of unclear etiology. He had a CT scan abdomen and pelvis that did not show any  masses in the pancreas but was concerning for possible cirrhosis of the liver. Patient denies history of alcohol abuse. Patient has also had a right upper quadrant ultrasound done 2 months ago which was negative for gallstones. No evidence of hypercalcemia. Patient is on a statin for cholesterol and has not had his cholesterol checked recently and therefore recommended that he follows up with his PCP  for a fasting cholesterol check. He has an appointment with GI in 10 days for further evaluation. Patient's clinical picture and labs are consistent with acute pancreatitis. Do not believe patient benefits from any imaging at this time. Discussed with patient that he needs to f/u with GI doctor for EUS to rule out any blockages in the pancreatic duct. Will give IVF, antiemetics and analgesia and reassess.  Clinical Course as of Mar 23 1338  Mon Mar 23, 2017  1334 Patient is tolerating by mouth. Low risk based on [place] criteria. Pain is well controlled. We'll discharge patient home at this time with Zofran, Percocet, and follow up with GI. Discussed slowly advancing his diet. Discussed signs and symptoms of worsening pancreatitis including uncontrolled pain, inability to tolerate PO, fever and recommended return to the ER if these develop. Patient is in agreement.   [CV]    Clinical Course User Index [CV] Rudene Re, MD    Pertinent labs & imaging results that were available during my care of the patient were reviewed by me and considered in my medical decision making (see chart for details).    ____________________________________________   FINAL CLINICAL IMPRESSION(S) / ED DIAGNOSES  Final diagnoses:  Idiopathic acute pancreatitis without infection or necrosis      NEW MEDICATIONS STARTED DURING THIS VISIT:  New Prescriptions   ONDANSETRON (ZOFRAN) 4 MG TABLET    Take 1 tablet (4 mg total) by mouth every 8 (eight) hours as needed for nausea or vomiting.   OXYCODONE-ACETAMINOPHEN  (ROXICET) 5-325 MG TABLET    Take 1 tablet by mouth every 6 (six) hours as needed.     Note:  This document was prepared using Dragon voice recognition software and may include unintentional dictation errors.    Alfred Levins, [place], MD 03/23/17 [phone]

## 2017-03-23 NOTE — ED Notes (Signed)
Pt verbalizes understanding of d/c teaching and rx. Pt calling for ride and waiting in lobby, pt offered wheelchair but states wants to ambulate. Pt in NAD at this time

## 2017-04-03 ENCOUNTER — Encounter: Payer: Self-pay | Admitting: Family

## 2017-04-03 ENCOUNTER — Ambulatory Visit: Payer: Medicaid Other | Attending: Family | Admitting: Family

## 2017-04-03 VITALS — BP 151/96 | HR 84 | Resp 18 | Ht 67.0 in | Wt 337.0 lb

## 2017-04-03 DIAGNOSIS — I110 Hypertensive heart disease with heart failure: Secondary | ICD-10-CM | POA: Diagnosis not present

## 2017-04-03 DIAGNOSIS — I471 Supraventricular tachycardia: Secondary | ICD-10-CM | POA: Diagnosis not present

## 2017-04-03 DIAGNOSIS — E119 Type 2 diabetes mellitus without complications: Secondary | ICD-10-CM | POA: Insufficient documentation

## 2017-04-03 DIAGNOSIS — J45909 Unspecified asthma, uncomplicated: Secondary | ICD-10-CM | POA: Diagnosis not present

## 2017-04-03 DIAGNOSIS — Z8249 Family history of ischemic heart disease and other diseases of the circulatory system: Secondary | ICD-10-CM | POA: Diagnosis not present

## 2017-04-03 DIAGNOSIS — Z9981 Dependence on supplemental oxygen: Secondary | ICD-10-CM | POA: Diagnosis not present

## 2017-04-03 DIAGNOSIS — K219 Gastro-esophageal reflux disease without esophagitis: Secondary | ICD-10-CM | POA: Diagnosis not present

## 2017-04-03 DIAGNOSIS — I2510 Atherosclerotic heart disease of native coronary artery without angina pectoris: Secondary | ICD-10-CM | POA: Insufficient documentation

## 2017-04-03 DIAGNOSIS — I252 Old myocardial infarction: Secondary | ICD-10-CM | POA: Diagnosis not present

## 2017-04-03 DIAGNOSIS — Z833 Family history of diabetes mellitus: Secondary | ICD-10-CM | POA: Insufficient documentation

## 2017-04-03 DIAGNOSIS — Z7982 Long term (current) use of aspirin: Secondary | ICD-10-CM | POA: Diagnosis not present

## 2017-04-03 DIAGNOSIS — I5022 Chronic systolic (congestive) heart failure: Secondary | ICD-10-CM | POA: Diagnosis not present

## 2017-04-03 DIAGNOSIS — F17210 Nicotine dependence, cigarettes, uncomplicated: Secondary | ICD-10-CM | POA: Diagnosis not present

## 2017-04-03 DIAGNOSIS — Z794 Long term (current) use of insulin: Secondary | ICD-10-CM | POA: Insufficient documentation

## 2017-04-03 DIAGNOSIS — G4733 Obstructive sleep apnea (adult) (pediatric): Secondary | ICD-10-CM | POA: Insufficient documentation

## 2017-04-03 DIAGNOSIS — Z825 Family history of asthma and other chronic lower respiratory diseases: Secondary | ICD-10-CM | POA: Diagnosis not present

## 2017-04-03 DIAGNOSIS — I10 Essential (primary) hypertension: Secondary | ICD-10-CM

## 2017-04-03 DIAGNOSIS — Z79899 Other long term (current) drug therapy: Secondary | ICD-10-CM | POA: Insufficient documentation

## 2017-04-03 DIAGNOSIS — Z95810 Presence of automatic (implantable) cardiac defibrillator: Secondary | ICD-10-CM | POA: Diagnosis not present

## 2017-04-03 DIAGNOSIS — Z720 Tobacco use: Secondary | ICD-10-CM

## 2017-04-03 DIAGNOSIS — Z888 Allergy status to other drugs, medicaments and biological substances status: Secondary | ICD-10-CM | POA: Insufficient documentation

## 2017-04-03 DIAGNOSIS — R220 Localized swelling, mass and lump, head: Secondary | ICD-10-CM | POA: Insufficient documentation

## 2017-04-03 DIAGNOSIS — E785 Hyperlipidemia, unspecified: Secondary | ICD-10-CM | POA: Diagnosis not present

## 2017-04-03 DIAGNOSIS — I509 Heart failure, unspecified: Secondary | ICD-10-CM | POA: Diagnosis present

## 2017-04-03 NOTE — Progress Notes (Signed)
Patient ID: Oscar [name], male    DOB: 01-04-1976, 41 y.o.   MRN: [id number]  HPI  Oscar [name] is a 41 y/o male with a history of obstructive sleep apnea (currently with CPAP), MI, HTN, hyperlipidemia, GERD, DM, SVT, asthma and chronic heart failure.  Last echo was done 02/06/15 which showed an EF of 25% which is unchanged from previous echo on 12/10/14 which showed an EF of 25% and mild Oscar.   Has been in the ED 3 times since he was last here with the most recent being 03/23/17. Twice for pancreatitis and once for parotitis. Admitted on 09/12/16 due to pancreatitis possibly due to diuretic. Elevated troponin thought to be due to demand ischemia.  Diuretic was held and patient was discharged home. Previous admission was 07/06/16 for chest pain and acute pancreatitis. Cardiology consult was obtained and patient was discharged home. Was in the ED on 03/10/16 with HF exacerbation and was treated and released. Prior admission on 11/22/15 was for a NSTEMI. Cardiology consult obtained during hospitalization and his defibrillator was interrogated. Sleep study was done on 05/06/16 but no results at this time.   He presents today for a follow-up visit with a chief complaint of mild shortness of breath with moderate exertion. He describes this as chronic in nature and has been present for several years. He has associated fatigue, wheezing and weight gain along with this. Hasn't been able to wear his CPAP recently due to facial swelling.   Past Medical History:  Diagnosis Date  . Asthma   . Cardiomyopathy ([place])   . CHF (congestive heart failure) ([place])   . Coronary artery disease   . Deafness in right ear   . Diabetes mellitus without complication ([place])   . Dysrhythmia    svt  . 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 GERD (gastroesophageal reflux disease)   . Hyperlipidemia   . Hypertension   . Hypoxemia   . Myocardial infarction ([place])    [id number],0/56  . Pancreatitis   . Sleep apnea    osa    Past Surgical History:  Procedure Laterality Date  . CARDIAC CATHETERIZATION  12/11/2014   Procedure: RIGHT/LEFT HEART CATH AND CORONARY ANGIOGRAPHY;  Surgeon: Lorretta Harp, MD;  Location: [facility] CATH LAB;  Service: Cardiovascular;;  . ICD LEAD REMOVAL N/A 03/30/2015   Procedure: ICD LEAD REMOVAL;  Surgeon: Marzetta Board, MD;  Location: [facility] ORS;  Service: Cardiovascular;  Laterality: N/A;  . IMPLANTABLE CARDIOVERTER DEFIBRILLATOR IMPLANT    . INSERT / REPLACE / REMOVE PACEMAKER    . LEFT HEART CATHETERIZATION WITH CORONARY ANGIOGRAM N/A 12/09/2014   Procedure: LEFT HEART CATHETERIZATION WITH CORONARY ANGIOGRAM;  Surgeon: Burnell Blanks, MD;  Location: [facility] CATH LAB;  Service: Cardiovascular;  Laterality: N/A;   Family History  Problem Relation Age of Onset  . Hypertension Mother   . Congestive Heart Failure Mother   . Hypertension Sister   . Diabetes Sister   . Pancreatitis Sister   . COPD Sister   . Pancreatitis Brother   . Anemia Neg Hx   . Arrhythmia Neg Hx   . Asthma Neg Hx   . Clotting disorder Neg Hx   . Fainting Neg Hx   . Heart attack Neg Hx   . Heart disease Neg Hx   . Heart failure Neg Hx   . Hyperlipidemia Neg Hx    Social History  Substance Use Topics  . Smoking status: Current Every Day  Smoker    Packs/day: 0.50    Years: 24.00    Types: Cigarettes  . Smokeless tobacco: Never Used     Comment: 4/5 Smoking 5 cigs a day  . Alcohol use No   Allergies  Allergen Reactions  . Bidil [Isosorb Dinitrate-Hydralazine] Other (See Comments)    Migraine Headache Migraine Headache    Prior to Admission medications   Medication Sig Start Date End Date Taking? Authorizing Provider  aspirin EC 325 MG tablet Take 1 tablet (325 mg total) by mouth daily. 07/10/15  Yes Aldean Jewett, MD  aspirin EC 81 MG tablet Take 81 mg by mouth daily.   Yes [provider]  atorvastatin (LIPITOR) 40 MG tablet Take 1 tablet (40 mg total) by mouth daily at 6 PM. 09/13/16   Yes Gouru, Aruna, MD  insulin glargine (LANTUS) 100 UNIT/ML injection Inject 0.1 mLs (10 Units total) into the skin at bedtime. Patient taking differently: Inject 50 Units into the skin at bedtime.  12/07/15  Yes Alisa Graff, FNP  insulin starter kit- syringes MISC 1 kit by Other route once. 07/10/15  Yes Aldean Jewett, MD  metFORMIN (GLUCOPHAGE) 1000 MG tablet Take 1,000 mg by mouth 2 (two) times daily with a meal.   Yes [provider]  metoprolol succinate (TOPROL XL) 25 MG 24 hr tablet Take 1 tablet (25 mg total) by mouth daily. 12/04/16  Yes Darylene Price A, FNP  omeprazole (PRILOSEC) 20 MG capsule Take 1 capsule (20 mg total) by mouth daily. 04/01/16  Yes McGowan, Larene Beach A, PA-C  polyethylene glycol (MIRALAX / GLYCOLAX) packet Take 17 g by mouth daily.   Yes [provider]  sacubitril-valsartan (ENTRESTO) 49-51 MG Take 1 tablet by mouth 2 (two) times daily. 10/29/16  Yes Darylene Price A, FNP  spironolactone (ALDACTONE) 25 MG tablet Take 1 tablet (25 mg total) by mouth daily. 09/19/16 01/09/17 Yes Hackney, Otila Kluver A, FNP  Syringe, Disposable, 1 ML MISC 0.1 mLs by Does not apply route as directed. 08/21/15  Yes Darylene Price A, FNP  tiotropium (SPIRIVA) 18 MCG inhalation capsule Place 18 mcg into inhaler and inhale daily.   Yes [provider]  torsemide (DEMADEX) 20 MG tablet Take 20 mg by mouth daily.    Yes [provider]    Review of Systems  Constitutional: Positive for fatigue. Negative for appetite change.  HENT: Negative for congestion, postnasal drip and sore throat.   Eyes: Negative.   Respiratory: Positive for shortness of breath and wheezing. Negative for chest tightness.   Cardiovascular: Negative for chest pain, palpitations and leg swelling.  Gastrointestinal: Positive for diarrhea. Negative for abdominal distention and abdominal pain.  Endocrine: Negative.   Genitourinary: Negative.   Musculoskeletal: Negative for back pain.  Skin:  Negative.   Allergic/Immunologic: Negative.   Neurological: Negative for dizziness and light-headedness.  Hematological: Negative for adenopathy. Does not bruise/bleed easily.  Psychiatric/Behavioral: Negative for dysphoric mood, sleep disturbance (sleeping on 4 pillows) and suicidal ideas. The patient is not nervous/anxious.    Vitals:   04/03/17 1048  Pulse: 84  Resp: 18  SpO2: 96%  Weight: (!) 337 lb (152.9 kg)  Height: _0  (1.702 m)   Wt Readings from Last 3 Encounters:  04/03/17 (!) 337 lb (152.9 kg)  03/23/17 (!) 333 lb (151 kg)  03/01/17 (!) 333 lb (151 kg)   Lab Results  Component Value Date   CREATININE 1.04 03/23/2017   CREATININE 1.05 03/01/2017   CREATININE 1.03  01/29/2017    Physical Exam  Constitutional: He is oriented to person, place, and time. He appears well-developed and well-nourished.  HENT:  Head: Normocephalic and atraumatic.  Neck: Normal range of motion. Neck supple. No JVD present.  Cardiovascular: Normal rate and regular rhythm.   Pulmonary/Chest: Effort normal. He has no wheezes. He has no rales.  Abdominal: Soft. He exhibits no distension. There is no tenderness.  Musculoskeletal: He exhibits no edema or tenderness.  Neurological: He is alert and oriented to person, place, and time.  Skin: Skin is warm and dry.  Psychiatric: He has a normal mood and affect. His behavior is normal. Thought content normal.  Nursing note and vitals reviewed.    Assessment & Plan:  1: Chronic heart failure with reduced ejection fraction- - NYHA class II - euvolemic today - weight up 5 pounds since he was last here. Reminded to call for an overnight weight gain of >2 pounds or a weekly weight gain of >5 pounds. - tolerated increase in entresto without known side effects; considering titrating up at next visit - tolerating toprol XL without known side effects; consider titrating up - echo ordered as last one done 2016 - last saw cardiology [name])  03/27/17 - bidil gave him migraine headache - Pharm D went in and reviewed medications with the patient - [facility] comes and fills out his pill box for him - does not meet ReDS vest criteria due to BMI  2: HTN- - BP slightly elevated today - saw PCP [facility]) 2 weeks ago and returns in about 3 months  3: Obstructive sleep apnea- - hasn't been wearing his CPAP recently due to facial swelling - encouraged him to try resuming his CPAP as soon as he can - continues wearing oxygen at 3L   4: Diabetes- - glucose at home was 237 - lantus is being titrated up - feels like he has diarrhea with metformin even at the lower dose; discussed talking with PCP about the XR version or trying something completely different  5: Tobacco use- - continues to smoke 2-3 cigarettes daily - does remove himself from the oxygen when smoking - complete cessation discussed for 3 minutes with him  Patient did not bring his medications nor a list. Each medication was verbally reviewed with the patient and he was encouraged to bring the bottles to every visit to confirm accuracy of list.  Return in 1 month or sooner for any questions/problems before then.

## 2017-04-03 NOTE — Patient Instructions (Addendum)
Continue weighing daily and call for an overnight weight gain of > 2 pounds or a weekly weight gain of >5 pounds.    Smoking Cessation Quitting smoking is important to your health and has many advantages. However, it is not always easy to quit since nicotine is a very addictive drug. Oftentimes, people try 3 times or more before being able to quit. This document explains the best ways for you to prepare to quit smoking. Quitting takes hard work and a lot of effort, but you can do it. ADVANTAGES OF QUITTING SMOKING  You will live longer, feel better, and live better.  Your body will feel the impact of quitting smoking almost immediately.  Within 20 minutes, blood pressure decreases. Your pulse returns to its normal level.  After 8 hours, carbon monoxide levels in the blood return to normal. Your oxygen level increases.  After 24 hours, the chance of having a heart attack starts to decrease. Your breath, hair, and body stop smelling like smoke.  After 48 hours, damaged nerve endings begin to recover. Your sense of taste and smell improve.  After 72 hours, the body is virtually free of nicotine. Your bronchial tubes relax and breathing becomes easier.  After 2 to 12 weeks, lungs can hold more air. Exercise becomes easier and circulation improves.  The risk of having a heart attack, stroke, cancer, or lung disease is greatly reduced.  After 1 year, the risk of coronary heart disease is cut in half.  After 5 years, the risk of stroke falls to the same as a nonsmoker.  After 10 years, the risk of lung cancer is cut in half and the risk of other cancers decreases significantly.  After 15 years, the risk of coronary heart disease drops, usually to the level of a nonsmoker.  If you are pregnant, quitting smoking will improve your chances of having a healthy baby.  The people you live with, especially any children, will be healthier.  You will have extra money to spend on things other  than cigarettes. QUESTIONS TO THINK ABOUT BEFORE ATTEMPTING TO QUIT You may want to talk about your answers with your health care provider.  Why do you want to quit?  If you tried to quit in the past, what helped and what did not?  What will be the most difficult situations for you after you quit? How will you plan to handle them?  Who can help you through the tough times? Your family? Friends? A health care provider?  What pleasures do you get from smoking? What ways can you still get pleasure if you quit? Here are some questions to ask your health care provider:  How can you help me to be successful at quitting?  What medicine do you think would be best for me and how should I take it?  What should I do if I need more help?  What is smoking withdrawal like? How can I get information on withdrawal? GET READY  Set a quit date.  Change your environment by getting rid of all cigarettes, ashtrays, matches, and lighters in your home, car, or work. Do not let people smoke in your home.  Review your past attempts to quit. Think about what worked and what did not. GET SUPPORT AND ENCOURAGEMENT You have a better chance of being successful if you have help. You can get support in many ways.  Tell your family, friends, and coworkers that you are going to quit and need their support. Ask   them not to smoke around you.  Get individual, group, or telephone counseling and support. Programs are available at local hospitals and health centers. Call your local health department for information about programs in your area.  Spiritual beliefs and practices may help some smokers quit.  Download a "quit meter" on your computer to keep track of quit statistics, such as how long you have gone without smoking, cigarettes not smoked, and money saved.  Get a self-help book about quitting smoking and staying off tobacco. LEARN NEW SKILLS AND BEHAVIORS  Distract yourself from urges to smoke. Talk to  someone, go for a walk, or occupy your time with a task.  Change your normal routine. Take a different route to work. Drink tea instead of coffee. Eat breakfast in a different place.  Reduce your stress. Take a hot bath, exercise, or read a book.  Plan something enjoyable to do every day. Reward yourself for not smoking.  Explore interactive web-based programs that specialize in helping you quit. GET MEDICINE AND USE IT CORRECTLY Medicines can help you stop smoking and decrease the urge to smoke. Combining medicine with the above behavioral methods and support can greatly increase your chances of successfully quitting smoking.  Nicotine replacement therapy helps deliver nicotine to your body without the negative effects and risks of smoking. Nicotine replacement therapy includes nicotine gum, lozenges, inhalers, nasal sprays, and skin patches. Some may be available over-the-counter and others require a prescription.  Antidepressant medicine helps people abstain from smoking, but how this works is unknown. This medicine is available by prescription.  Nicotinic receptor partial agonist medicine simulates the effect of nicotine in your brain. This medicine is available by prescription. Ask your health care provider for advice about which medicines to use and how to use them based on your health history. Your health care provider will tell you what side effects to look out for if you choose to be on a medicine or therapy. Carefully read the information on the package. Do not use any other product containing nicotine while using a nicotine replacement product.  RELAPSE OR DIFFICULT SITUATIONS Most relapses occur within the first 3 months after quitting. Do not be discouraged if you start smoking again. Remember, most people try several times before finally quitting. You may have symptoms of withdrawal because your body is used to nicotine. You may crave cigarettes, be irritable, feel very hungry, cough  often, get headaches, or have difficulty concentrating. The withdrawal symptoms are only temporary. They are strongest when you first quit, but they will go away within 10-14 days. To reduce the chances of relapse, try to:  Avoid drinking alcohol. Drinking lowers your chances of successfully quitting.  Reduce the amount of caffeine you consume. Once you quit smoking, the amount of caffeine in your body increases and can give you symptoms, such as a rapid heartbeat, sweating, and anxiety.  Avoid smokers because they can make you want to smoke.  Do not let weight gain distract you. Many smokers will gain weight when they quit, usually less than 10 pounds. Eat a healthy diet and stay active. You can always lose the weight gained after you quit.  Find ways to improve your mood other than smoking. FOR MORE INFORMATION  www.smokefree.gov  Document Released: 08/12/2001 Document Revised: 01/02/2014 Document Reviewed: 11/27/2011 ExitCare Patient Information 2015 [employer]. This information is not intended to replace advice given to you by your health care provider. Make sure you discuss any questions you have with your   health care provider.  

## 2017-04-16 ENCOUNTER — Ambulatory Visit: Payer: Medicaid Other

## 2017-05-01 ENCOUNTER — Ambulatory Visit: Payer: Medicaid Other | Attending: Family | Admitting: Family

## 2017-05-01 ENCOUNTER — Encounter: Payer: Self-pay | Admitting: Family

## 2017-05-01 VITALS — BP 109/84 | HR 82 | Resp 18 | Ht 67.0 in | Wt 328.4 lb

## 2017-05-01 DIAGNOSIS — Z794 Long term (current) use of insulin: Secondary | ICD-10-CM | POA: Diagnosis not present

## 2017-05-01 DIAGNOSIS — I2510 Atherosclerotic heart disease of native coronary artery without angina pectoris: Secondary | ICD-10-CM | POA: Insufficient documentation

## 2017-05-01 DIAGNOSIS — J45909 Unspecified asthma, uncomplicated: Secondary | ICD-10-CM | POA: Insufficient documentation

## 2017-05-01 DIAGNOSIS — I429 Cardiomyopathy, unspecified: Secondary | ICD-10-CM | POA: Diagnosis not present

## 2017-05-01 DIAGNOSIS — I509 Heart failure, unspecified: Secondary | ICD-10-CM | POA: Insufficient documentation

## 2017-05-01 DIAGNOSIS — E119 Type 2 diabetes mellitus without complications: Secondary | ICD-10-CM | POA: Diagnosis not present

## 2017-05-01 DIAGNOSIS — Z720 Tobacco use: Secondary | ICD-10-CM

## 2017-05-01 DIAGNOSIS — I10 Essential (primary) hypertension: Secondary | ICD-10-CM

## 2017-05-01 DIAGNOSIS — Z95810 Presence of automatic (implantable) cardiac defibrillator: Secondary | ICD-10-CM | POA: Insufficient documentation

## 2017-05-01 DIAGNOSIS — I252 Old myocardial infarction: Secondary | ICD-10-CM | POA: Insufficient documentation

## 2017-05-01 DIAGNOSIS — K219 Gastro-esophageal reflux disease without esophagitis: Secondary | ICD-10-CM | POA: Diagnosis not present

## 2017-05-01 DIAGNOSIS — G4733 Obstructive sleep apnea (adult) (pediatric): Secondary | ICD-10-CM | POA: Insufficient documentation

## 2017-05-01 DIAGNOSIS — I110 Hypertensive heart disease with heart failure: Secondary | ICD-10-CM | POA: Insufficient documentation

## 2017-05-01 DIAGNOSIS — I5022 Chronic systolic (congestive) heart failure: Secondary | ICD-10-CM

## 2017-05-01 DIAGNOSIS — F17210 Nicotine dependence, cigarettes, uncomplicated: Secondary | ICD-10-CM | POA: Insufficient documentation

## 2017-05-01 DIAGNOSIS — E785 Hyperlipidemia, unspecified: Secondary | ICD-10-CM | POA: Diagnosis not present

## 2017-05-01 DIAGNOSIS — I471 Supraventricular tachycardia: Secondary | ICD-10-CM | POA: Diagnosis not present

## 2017-05-01 DIAGNOSIS — Z7982 Long term (current) use of aspirin: Secondary | ICD-10-CM | POA: Diagnosis not present

## 2017-05-01 NOTE — Patient Instructions (Signed)
Continue weighing daily and call for an overnight weight gain of > 2 pounds or a weekly weight gain of >5 pounds.    Smoking Cessation Quitting smoking is important to your health and has many advantages. However, it is not always easy to quit since nicotine is a very addictive drug. Oftentimes, people try 3 times or more before being able to quit. This document explains the best ways for you to prepare to quit smoking. Quitting takes hard work and a lot of effort, but you can do it. ADVANTAGES OF QUITTING SMOKING  You will live longer, feel better, and live better.  Your body will feel the impact of quitting smoking almost immediately.  Within 20 minutes, blood pressure decreases. Your pulse returns to its normal level.  After 8 hours, carbon monoxide levels in the blood return to normal. Your oxygen level increases.  After 24 hours, the chance of having a heart attack starts to decrease. Your breath, hair, and body stop smelling like smoke.  After 48 hours, damaged nerve endings begin to recover. Your sense of taste and smell improve.  After 72 hours, the body is virtually free of nicotine. Your bronchial tubes relax and breathing becomes easier.  After 2 to 12 weeks, lungs can hold more air. Exercise becomes easier and circulation improves.  The risk of having a heart attack, stroke, cancer, or lung disease is greatly reduced.  After 1 year, the risk of coronary heart disease is cut in half.  After 5 years, the risk of stroke falls to the same as a nonsmoker.  After 10 years, the risk of lung cancer is cut in half and the risk of other cancers decreases significantly.  After 15 years, the risk of coronary heart disease drops, usually to the level of a nonsmoker.  If you are pregnant, quitting smoking will improve your chances of having a healthy baby.  The people you live with, especially any children, will be healthier.  You will have extra money to spend on things other  than cigarettes. QUESTIONS TO THINK ABOUT BEFORE ATTEMPTING TO QUIT You may want to talk about your answers with your health care provider.  Why do you want to quit?  If you tried to quit in the past, what helped and what did not?  What will be the most difficult situations for you after you quit? How will you plan to handle them?  Who can help you through the tough times? Your family? Friends? A health care provider?  What pleasures do you get from smoking? What ways can you still get pleasure if you quit? Here are some questions to ask your health care provider:  How can you help me to be successful at quitting?  What medicine do you think would be best for me and how should I take it?  What should I do if I need more help?  What is smoking withdrawal like? How can I get information on withdrawal? GET READY  Set a quit date.  Change your environment by getting rid of all cigarettes, ashtrays, matches, and lighters in your home, car, or work. Do not let people smoke in your home.  Review your past attempts to quit. Think about what worked and what did not. GET SUPPORT AND ENCOURAGEMENT You have a better chance of being successful if you have help. You can get support in many ways.  Tell your family, friends, and coworkers that you are going to quit and need their support. Ask   them not to smoke around you.  Get individual, group, or telephone counseling and support. Programs are available at local hospitals and health centers. Call your local health department for information about programs in your area.  Spiritual beliefs and practices may help some smokers quit.  Download a "quit meter" on your computer to keep track of quit statistics, such as how long you have gone without smoking, cigarettes not smoked, and money saved.  Get a self-help book about quitting smoking and staying off tobacco. LEARN NEW SKILLS AND BEHAVIORS  Distract yourself from urges to smoke. Talk to  someone, go for a walk, or occupy your time with a task.  Change your normal routine. Take a different route to work. Drink tea instead of coffee. Eat breakfast in a different place.  Reduce your stress. Take a hot bath, exercise, or read a book.  Plan something enjoyable to do every day. Reward yourself for not smoking.  Explore interactive web-based programs that specialize in helping you quit. GET MEDICINE AND USE IT CORRECTLY Medicines can help you stop smoking and decrease the urge to smoke. Combining medicine with the above behavioral methods and support can greatly increase your chances of successfully quitting smoking.  Nicotine replacement therapy helps deliver nicotine to your body without the negative effects and risks of smoking. Nicotine replacement therapy includes nicotine gum, lozenges, inhalers, nasal sprays, and skin patches. Some may be available over-the-counter and others require a prescription.  Antidepressant medicine helps people abstain from smoking, but how this works is unknown. This medicine is available by prescription.  Nicotinic receptor partial agonist medicine simulates the effect of nicotine in your brain. This medicine is available by prescription. Ask your health care provider for advice about which medicines to use and how to use them based on your health history. Your health care provider will tell you what side effects to look out for if you choose to be on a medicine or therapy. Carefully read the information on the package. Do not use any other product containing nicotine while using a nicotine replacement product.  RELAPSE OR DIFFICULT SITUATIONS Most relapses occur within the first 3 months after quitting. Do not be discouraged if you start smoking again. Remember, most people try several times before finally quitting. You may have symptoms of withdrawal because your body is used to nicotine. You may crave cigarettes, be irritable, feel very hungry, cough  often, get headaches, or have difficulty concentrating. The withdrawal symptoms are only temporary. They are strongest when you first quit, but they will go away within 10-14 days. To reduce the chances of relapse, try to:  Avoid drinking alcohol. Drinking lowers your chances of successfully quitting.  Reduce the amount of caffeine you consume. Once you quit smoking, the amount of caffeine in your body increases and can give you symptoms, such as a rapid heartbeat, sweating, and anxiety.  Avoid smokers because they can make you want to smoke.  Do not let weight gain distract you. Many smokers will gain weight when they quit, usually less than 10 pounds. Eat a healthy diet and stay active. You can always lose the weight gained after you quit.  Find ways to improve your mood other than smoking. FOR MORE INFORMATION  www.smokefree.gov  Document Released: 08/12/2001 Document Revised: 01/02/2014 Document Reviewed: 11/27/2011 [employer] Patient Information 2015 [employer]. This information is not intended to replace advice given to you by your health care provider. Make sure you discuss any questions you have with your   health care provider.  

## 2017-05-01 NOTE — Progress Notes (Signed)
Patient ID: Oscar [name], male    DOB: [birth year], 41 y.o.   MRN: [id number]  HPI  Oscar [name] is a 41 y/o male with a history of obstructive sleep apnea (currently with CPAP), MI, HTN, hyperlipidemia, GERD, DM, SVT, asthma and chronic heart failure.  Last echo was done 02/06/15 which showed an EF of 25% which is unchanged from previous echo on 12/10/14 which showed an EF of 25% and mild Oscar.   Has been in the ED 3 times since he was last here with the most recent being 03/23/17. Twice for pancreatitis and once for parotitis. Admitted on 09/12/16 due to pancreatitis possibly due to diuretic. Elevated troponin thought to be due to demand ischemia.  Diuretic was held and patient was discharged home. Previous admission was 07/06/16 for chest pain and acute pancreatitis. Cardiology consult was obtained and patient was discharged home. Was in the ED on 03/10/16 with HF exacerbation and was treated and released. Prior admission on 11/22/15 was for a NSTEMI. Cardiology consult obtained during hospitalization and his defibrillator was interrogated. Sleep study was done on 05/06/16 but no results at this time.   He presents today for a follow-up visit with a chief complaint of moderate fatigue with moderate exertion. He describes this as chronic in nature having been present for several years with varying levels of intensity. He has associated shortness of breath and wheezing along with this. He denies any edema or weight gain. Having difficulty sleeping due to staying up late and then sleeping in.   Past Medical History:  Diagnosis Date  . Asthma   . Cardiomyopathy ([place])   . CHF (congestive heart failure) ([place])   . Coronary artery disease   . Deafness in right ear   . Diabetes mellitus without complication ([place])   . Dysrhythmia    svt  . 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 GERD (gastroesophageal reflux disease)   . Hyperlipidemia   . Hypertension   . Hypoxemia   . Myocardial infarction ([place])     5427,0623,7/62  . Pancreatitis   . Sleep apnea    osa   Past Surgical History:  Procedure Laterality Date  . CARDIAC CATHETERIZATION  12/11/2014   Procedure: RIGHT/LEFT HEART CATH AND CORONARY ANGIOGRAPHY;  Surgeon: Lorretta Harp, MD;  Location: [facility] CATH LAB;  Service: Cardiovascular;;  . ICD LEAD REMOVAL N/A 03/30/2015   Procedure: ICD LEAD REMOVAL;  Surgeon: Marzetta Board, MD;  Location: [facility] ORS;  Service: Cardiovascular;  Laterality: N/A;  . IMPLANTABLE CARDIOVERTER DEFIBRILLATOR IMPLANT    . INSERT / REPLACE / REMOVE PACEMAKER    . LEFT HEART CATHETERIZATION WITH CORONARY ANGIOGRAM N/A 12/09/2014   Procedure: LEFT HEART CATHETERIZATION WITH CORONARY ANGIOGRAM;  Surgeon: Burnell Blanks, MD;  Location: [facility] CATH LAB;  Service: Cardiovascular;  Laterality: N/A;   Family History  Problem Relation Age of Onset  . Hypertension Mother   . Congestive Heart Failure Mother   . Hypertension Sister   . Diabetes Sister   . Pancreatitis Sister   . COPD Sister   . Pancreatitis Brother   . Anemia Neg Hx   . Arrhythmia Neg Hx   . Asthma Neg Hx   . Clotting disorder Neg Hx   . Fainting Neg Hx   . Heart attack Neg Hx   . Heart disease Neg Hx   . Heart failure Neg Hx   . Hyperlipidemia Neg Hx    Social History  Substance  Use Topics  . Smoking status: Current Every Day Smoker    Packs/day: 0.50    Years: 24.00    Types: Cigarettes  . Smokeless tobacco: Never Used     Comment: 4/5 Smoking 5 cigs a day  . Alcohol use No   Allergies  Allergen Reactions  . Bidil [Isosorb Dinitrate-Hydralazine] Other (See Comments)    Migraine Headache Migraine Headache   Prior to Admission medications   Medication Sig Start Date End Date Taking? Authorizing Provider  aspirin EC 325 MG tablet Take 1 tablet (325 mg total) by mouth daily. 07/10/15  Yes Aldean Jewett, MD  atorvastatin (LIPITOR) 40 MG tablet Take 1 tablet (40 mg total) by mouth daily at 6 PM. 09/13/16  Yes Gouru, Aruna, MD   insulin glargine (LANTUS) 100 UNIT/ML injection Inject 56 Units into the skin at bedtime.    Yes [provider]  insulin starter kit- syringes MISC 1 kit by Other route once. 07/10/15  Yes Aldean Jewett, MD  metFORMIN (GLUCOPHAGE) 1000 MG tablet Take 1,000 mg by mouth 2 (two) times daily with a meal.   Yes [provider]  metoprolol succinate (TOPROL XL) 25 MG 24 hr tablet Take 1 tablet (25 mg total) by mouth daily. 12/04/16  Yes Darylene Price A, FNP  omeprazole (PRILOSEC) 20 MG capsule Take 1 capsule (20 mg total) by mouth daily. 04/01/16  Yes McGowan, Larene Beach A, PA-C  ondansetron (ZOFRAN ODT) 8 MG disintegrating tablet Take 1 tablet (8 mg total) by mouth every 8 (eight) hours as needed for nausea or vomiting. 01/29/17  Yes Darel Hong, MD  ondansetron (ZOFRAN) 4 MG tablet Take 1 tablet (4 mg total) by mouth every 8 (eight) hours as needed for nausea or vomiting. 03/23/17  Yes Alfred Levins, [place], MD  oxyCODONE-acetaminophen (ROXICET) 5-325 MG tablet Take 1 tablet by mouth every 6 (six) hours as needed. 03/23/17 03/23/18 Yes Veronese, [place], MD  polyethylene glycol [facility] / Floria Raveling) packet Take 17 g by mouth daily.   Yes [provider]  sacubitril-valsartan (ENTRESTO) 49-51 MG Take 1 tablet by mouth 2 (two) times daily. 10/29/16  Yes Darylene Price A, FNP  spironolactone (ALDACTONE) 25 MG tablet Take 1 tablet (25 mg total) by mouth daily. 09/19/16 01/29/18 Yes Caymen Dubray, Otila Kluver A, FNP  Syringe, Disposable, 1 ML MISC 0.1 mLs by Does not apply route as directed. 08/21/15  Yes Darylene Price A, FNP  tiotropium (SPIRIVA) 18 MCG inhalation capsule Place 18 mcg into inhaler and inhale daily.   Yes [provider]  torsemide (DEMADEX) 20 MG tablet Take 20 mg by mouth daily.    Yes [provider]   Review of Systems  Constitutional: Positive for fatigue. Negative for appetite change.  HENT: Negative for congestion, postnasal drip and sore throat.   Eyes:  Negative.   Respiratory: Positive for shortness of breath and wheezing. Negative for chest tightness.   Cardiovascular: Negative for chest pain, palpitations and leg swelling.  Gastrointestinal: Positive for diarrhea. Negative for abdominal distention and abdominal pain.  Endocrine: Negative.   Genitourinary: Negative.   Musculoskeletal: Negative for back pain.  Skin: Negative.   Allergic/Immunologic: Negative.   Neurological: Negative for dizziness and light-headedness.  Hematological: Negative for adenopathy. Does not bruise/bleed easily.  Psychiatric/Behavioral: Negative for dysphoric mood, sleep disturbance (sleeping on 4 pillows) and suicidal ideas. The patient is not nervous/anxious.    Vitals:   05/01/17 1113  BP: 109/84  Pulse: 82  Resp: 18  SpO2: 97%  Weight: Marland Kitchen)  328 lb 6 oz (148.9 kg)  Height: 5' 7"  (1.702 m)   Wt Readings from Last 3 Encounters:  05/01/17 (!) 328 lb 6 oz (148.9 kg)  04/03/17 (!) 337 lb (152.9 kg)  03/23/17 (!) 333 lb (151 kg)    Lab Results  Component Value Date   CREATININE 1.04 03/23/2017   CREATININE 1.05 03/01/2017   CREATININE 1.03 01/29/2017    Physical Exam  Constitutional: He is oriented to person, place, and time. He appears well-developed and well-nourished.  HENT:  Head: Normocephalic and atraumatic.  Neck: Normal range of motion. Neck supple. No JVD present.  Cardiovascular: Normal rate and regular rhythm.   Pulmonary/Chest: Effort normal. He has no wheezes. He has no rales.  Abdominal: Soft. He exhibits no distension. There is no tenderness.  Musculoskeletal: He exhibits no edema or tenderness.  Neurological: He is alert and oriented to person, place, and time.  Skin: Skin is warm and dry.  Psychiatric: He has a normal mood and affect. His behavior is normal. Thought content normal.  Nursing note and vitals reviewed.    Assessment & Plan:  1: Chronic heart failure with reduced ejection fraction- - NYHA class II - euvolemic  today - weight down 9 pounds since he was last here. Reminded to call for an overnight weight gain of >2 pounds or a weekly weight gain of >5 pounds. Says that he's been having abdominal pain when eating meat so he's been eating more fruits/vegetables - tolerating toprol XL without known side effects; consider titrating up - had missed his echo appointment so it was rescheduled for 05/18/17 - last saw cardiology [name]) 03/27/17 - bidil gave him migraine headache - [facility] comes and fills out his pill box for him - does not meet ReDS vest criteria due to BMI  2: HTN- - BP on the low side today; may not be able to titrate up entresto - BMP from 03/23/17 reviewed and shows sodium 135, potassium 3.7 & GFR >60  3: Obstructive sleep apnea- - has resumed wearing his CPAP and wears it for about 3 hours each night - continues wearing oxygen at 3L  - has his days/nights turned around and is sleeping most of the day and then not going to sleep until close to 4am; not interested in melatonin - encouraged good sleep hygiene such as not watching tv late at night  4: Diabetes- - glucose at home was 263 fasting - lantus is being titrated up by PCP - feels like he has diarrhea with metformin even at the lower dose; discussed talking with PCP about the XR version or trying something completely different  5: Tobacco use- - continues to smoke 4 cigarettes daily - does remove himself from the oxygen when smoking - complete cessation discussed for 3 minutes with him  Patient did not bring his medications nor a list. Each medication was verbally reviewed with the patient and he was encouraged to bring the bottles to every visit to confirm accuracy of list.  Return in 3 months or sooner for any questions/problems before then.

## 2017-05-18 ENCOUNTER — Emergency Department: Payer: Medicaid Other

## 2017-05-18 ENCOUNTER — Other Ambulatory Visit: Payer: Self-pay

## 2017-05-18 ENCOUNTER — Encounter: Payer: Self-pay | Admitting: Emergency Medicine

## 2017-05-18 ENCOUNTER — Inpatient Hospital Stay
Admission: EM | Admit: 2017-05-18 | Discharge: 2017-05-20 | DRG: 439 | Disposition: A | Discharge status: Home / Self Care | Payer: Medicaid Other | Attending: Internal Medicine | Admitting: Internal Medicine

## 2017-05-18 DIAGNOSIS — F17210 Nicotine dependence, cigarettes, uncomplicated: Secondary | ICD-10-CM | POA: Diagnosis present

## 2017-05-18 DIAGNOSIS — Z950 Presence of cardiac pacemaker: Secondary | ICD-10-CM

## 2017-05-18 DIAGNOSIS — R748 Abnormal levels of other serum enzymes: Secondary | ICD-10-CM | POA: Diagnosis present

## 2017-05-18 DIAGNOSIS — K859 Acute pancreatitis without necrosis or infection, unspecified: Principal | ICD-10-CM

## 2017-05-18 DIAGNOSIS — J449 Chronic obstructive pulmonary disease, unspecified: Secondary | ICD-10-CM | POA: Diagnosis present

## 2017-05-18 DIAGNOSIS — I252 Old myocardial infarction: Secondary | ICD-10-CM

## 2017-05-18 DIAGNOSIS — R109 Unspecified abdominal pain: Secondary | ICD-10-CM

## 2017-05-18 DIAGNOSIS — E785 Hyperlipidemia, unspecified: Secondary | ICD-10-CM | POA: Diagnosis present

## 2017-05-18 DIAGNOSIS — E1165 Type 2 diabetes mellitus with hyperglycemia: Secondary | ICD-10-CM | POA: Diagnosis present

## 2017-05-18 DIAGNOSIS — K219 Gastro-esophageal reflux disease without esophagitis: Secondary | ICD-10-CM | POA: Diagnosis present

## 2017-05-18 DIAGNOSIS — R7989 Other specified abnormal findings of blood chemistry: Secondary | ICD-10-CM

## 2017-05-18 DIAGNOSIS — Z79899 Other long term (current) drug therapy: Secondary | ICD-10-CM

## 2017-05-18 DIAGNOSIS — I110 Hypertensive heart disease with heart failure: Secondary | ICD-10-CM | POA: Diagnosis present

## 2017-05-18 DIAGNOSIS — T380X5A Adverse effect of glucocorticoids and synthetic analogues, initial encounter: Secondary | ICD-10-CM | POA: Diagnosis present

## 2017-05-18 DIAGNOSIS — R079 Chest pain, unspecified: Secondary | ICD-10-CM

## 2017-05-18 DIAGNOSIS — M109 Gout, unspecified: Secondary | ICD-10-CM | POA: Diagnosis present

## 2017-05-18 DIAGNOSIS — I5022 Chronic systolic (congestive) heart failure: Secondary | ICD-10-CM | POA: Diagnosis present

## 2017-05-18 DIAGNOSIS — I2510 Atherosclerotic heart disease of native coronary artery without angina pectoris: Secondary | ICD-10-CM | POA: Diagnosis present

## 2017-05-18 DIAGNOSIS — I429 Cardiomyopathy, unspecified: Secondary | ICD-10-CM | POA: Diagnosis present

## 2017-05-18 DIAGNOSIS — G4733 Obstructive sleep apnea (adult) (pediatric): Secondary | ICD-10-CM | POA: Diagnosis present

## 2017-05-18 DIAGNOSIS — Z6841 Body Mass Index (BMI) 40.0 and over, adult: Secondary | ICD-10-CM

## 2017-05-18 DIAGNOSIS — Z794 Long term (current) use of insulin: Secondary | ICD-10-CM

## 2017-05-18 DIAGNOSIS — Z7982 Long term (current) use of aspirin: Secondary | ICD-10-CM

## 2017-05-18 DIAGNOSIS — H9191 Unspecified hearing loss, right ear: Secondary | ICD-10-CM | POA: Diagnosis present

## 2017-05-18 DIAGNOSIS — R778 Other specified abnormalities of plasma proteins: Secondary | ICD-10-CM

## 2017-05-18 LAB — CBC
HCT: 40.1 % (ref 40.0–52.0)
Hemoglobin: 14.5 g/dL (ref 13.0–18.0)
MCH: 33.4 pg (ref 26.0–34.0)
MCHC: 36.1 g/dL — AB (ref 32.0–36.0)
MCV: 92.6 fL (ref 80.0–100.0)
PLATELETS: 193 10*3/uL (ref 150–440)
RBC: 4.33 MIL/uL — AB (ref 4.40–5.90)
RDW: 12.6 % (ref 11.5–14.5)
WBC: 8.2 10*3/uL (ref 3.8–10.6)

## 2017-05-18 LAB — BRAIN NATRIURETIC PEPTIDE: B Natriuretic Peptide: 156 pg/mL — ABNORMAL HIGH (ref 0.0–100.0)

## 2017-05-18 LAB — BASIC METABOLIC PANEL
Anion gap: 9 (ref 5–15)
BUN: 16 mg/dL (ref 6–20)
CALCIUM: 9.2 mg/dL (ref 8.9–10.3)
CHLORIDE: 100 mmol/L — AB (ref 101–111)
CO2: 25 mmol/L (ref 22–32)
CREATININE: 0.93 mg/dL (ref 0.61–1.24)
GFR calc Af Amer: >60 mL/min (ref >60)
GFR calc non Af Amer: >60 mL/min (ref >60)
GLUCOSE: 208 mg/dL — AB (ref 65–99)
Potassium: 4.1 mmol/L (ref 3.5–5.1)
Sodium: 134 mmol/L — ABNORMAL LOW (ref 135–145)

## 2017-05-18 LAB — TROPONIN I: TROPONIN I: 0.09 ng/mL — AB (ref <0.03)

## 2017-05-18 LAB — LIPASE, BLOOD: Lipase: 1104 U/L — ABNORMAL HIGH (ref 11–51)

## 2017-05-18 MED ORDER — ONDANSETRON HCL 4 MG/2ML IJ SOLN
4.0000 mg | Freq: Once | INTRAMUSCULAR | Status: AC
  Administered 2017-05-18: 4 mg via INTRAVENOUS
  Filled 2017-05-18: qty 2

## 2017-05-18 MED ORDER — ONDANSETRON HCL 4 MG/2ML IJ SOLN
4.0000 mg | Freq: Once | INTRAMUSCULAR | Status: AC | PRN
  Administered 2017-05-18: 4 mg via INTRAVENOUS
  Filled 2017-05-18: qty 2

## 2017-05-18 MED ORDER — MORPHINE SULFATE (PF) 4 MG/ML IV SOLN
4.0000 mg | Freq: Once | INTRAVENOUS | Status: AC
  Administered 2017-05-18: 4 mg via INTRAVENOUS
  Filled 2017-05-18: qty 1

## 2017-05-18 MED ORDER — FENTANYL CITRATE (PF) 100 MCG/2ML IJ SOLN
50.0000 ug | INTRAMUSCULAR | Status: DC | PRN
  Administered 2017-05-18: 50 ug via INTRAVENOUS
  Filled 2017-05-18: qty 2

## 2017-05-18 NOTE — ED Triage Notes (Signed)
Patient to ER from home via [facility] for c/o upper mid abd pain, as well as chest pain and pain to right foot up to ankle. Patient denies any injuries. States he has h/o pancreatitis and pain is severe like previous episodes of pancreatitis. Patient has had foot pain and abd pain x2 days, developed chest tightness today. Patient wears 3L O2 at home at all times, was placed on 3L upon arrival to ER. CBG for EMS was 200. EKG for EMS unremarkable. 324 ASA given en route.

## 2017-05-19 ENCOUNTER — Encounter: Payer: Self-pay | Admitting: Internal Medicine

## 2017-05-19 ENCOUNTER — Emergency Department: Payer: Medicaid Other

## 2017-05-19 DIAGNOSIS — G4733 Obstructive sleep apnea (adult) (pediatric): Secondary | ICD-10-CM | POA: Diagnosis present

## 2017-05-19 DIAGNOSIS — I429 Cardiomyopathy, unspecified: Secondary | ICD-10-CM | POA: Diagnosis present

## 2017-05-19 DIAGNOSIS — Z7982 Long term (current) use of aspirin: Secondary | ICD-10-CM | POA: Diagnosis not present

## 2017-05-19 DIAGNOSIS — K219 Gastro-esophageal reflux disease without esophagitis: Secondary | ICD-10-CM | POA: Diagnosis present

## 2017-05-19 DIAGNOSIS — I2510 Atherosclerotic heart disease of native coronary artery without angina pectoris: Secondary | ICD-10-CM | POA: Diagnosis present

## 2017-05-19 DIAGNOSIS — F17210 Nicotine dependence, cigarettes, uncomplicated: Secondary | ICD-10-CM | POA: Diagnosis present

## 2017-05-19 DIAGNOSIS — I252 Old myocardial infarction: Secondary | ICD-10-CM | POA: Diagnosis not present

## 2017-05-19 DIAGNOSIS — Z6841 Body Mass Index (BMI) 40.0 and over, adult: Secondary | ICD-10-CM | POA: Diagnosis not present

## 2017-05-19 DIAGNOSIS — M109 Gout, unspecified: Secondary | ICD-10-CM | POA: Diagnosis present

## 2017-05-19 DIAGNOSIS — E1165 Type 2 diabetes mellitus with hyperglycemia: Secondary | ICD-10-CM | POA: Diagnosis present

## 2017-05-19 DIAGNOSIS — R748 Abnormal levels of other serum enzymes: Secondary | ICD-10-CM | POA: Diagnosis present

## 2017-05-19 DIAGNOSIS — I5022 Chronic systolic (congestive) heart failure: Secondary | ICD-10-CM | POA: Diagnosis present

## 2017-05-19 DIAGNOSIS — R109 Unspecified abdominal pain: Secondary | ICD-10-CM | POA: Diagnosis present

## 2017-05-19 DIAGNOSIS — E785 Hyperlipidemia, unspecified: Secondary | ICD-10-CM | POA: Diagnosis present

## 2017-05-19 DIAGNOSIS — Z794 Long term (current) use of insulin: Secondary | ICD-10-CM | POA: Diagnosis not present

## 2017-05-19 DIAGNOSIS — Z950 Presence of cardiac pacemaker: Secondary | ICD-10-CM | POA: Diagnosis not present

## 2017-05-19 DIAGNOSIS — Z79899 Other long term (current) drug therapy: Secondary | ICD-10-CM | POA: Diagnosis not present

## 2017-05-19 DIAGNOSIS — I110 Hypertensive heart disease with heart failure: Secondary | ICD-10-CM | POA: Diagnosis present

## 2017-05-19 DIAGNOSIS — K859 Acute pancreatitis without necrosis or infection, unspecified: Secondary | ICD-10-CM | POA: Diagnosis not present

## 2017-05-19 DIAGNOSIS — T380X5A Adverse effect of glucocorticoids and synthetic analogues, initial encounter: Secondary | ICD-10-CM | POA: Diagnosis present

## 2017-05-19 DIAGNOSIS — J449 Chronic obstructive pulmonary disease, unspecified: Secondary | ICD-10-CM | POA: Diagnosis present

## 2017-05-19 DIAGNOSIS — H9191 Unspecified hearing loss, right ear: Secondary | ICD-10-CM | POA: Diagnosis present

## 2017-05-19 LAB — TROPONIN I
Troponin I: 0.09 ng/mL (ref <0.03)
Troponin I: 0.09 ng/mL (ref <0.03)
Troponin I: 0.08 ng/mL (ref <0.03)
Troponin I: 0.06 ng/mL (ref <0.03)

## 2017-05-19 LAB — HEPATIC FUNCTION PANEL
ALBUMIN: 3.6 g/dL (ref 3.5–5.0)
ALT: 24 U/L (ref 17–63)
AST: 26 U/L (ref 15–41)
Alkaline Phosphatase: 196 U/L — ABNORMAL HIGH (ref 38–126)
BILIRUBIN TOTAL: 0.5 mg/dL (ref 0.3–1.2)
Bilirubin, Direct: 0.1 mg/dL (ref 0.1–0.5)
Indirect Bilirubin: 0.4 mg/dL (ref 0.3–0.9)
Total Protein: 8 g/dL (ref 6.5–8.1)

## 2017-05-19 LAB — BASIC METABOLIC PANEL
Anion gap: 8 (ref 5–15)
BUN: 16 mg/dL (ref 6–20)
CHLORIDE: 100 mmol/L — AB (ref 101–111)
CO2: 28 mmol/L (ref 22–32)
Calcium: 8.7 mg/dL — ABNORMAL LOW (ref 8.9–10.3)
Creatinine, Ser: 0.98 mg/dL (ref 0.61–1.24)
GFR calc Af Amer: >60 mL/min (ref >60)
GFR calc non Af Amer: >60 mL/min (ref >60)
GLUCOSE: 218 mg/dL — AB (ref 65–99)
Potassium: 4.1 mmol/L (ref 3.5–5.1)
SODIUM: 136 mmol/L (ref 135–145)

## 2017-05-19 LAB — CBC
HEMATOCRIT: 38.7 % — AB (ref 40.0–52.0)
HEMOGLOBIN: 14 g/dL (ref 13.0–18.0)
MCH: 34.2 pg — AB (ref 26.0–34.0)
MCHC: 36.2 g/dL — AB (ref 32.0–36.0)
MCV: 94.5 fL (ref 80.0–100.0)
Platelets: 175 10*3/uL (ref 150–440)
RBC: 4.09 MIL/uL — AB (ref 4.40–5.90)
RDW: 12.7 % (ref 11.5–14.5)
WBC: 7.3 10*3/uL (ref 3.8–10.6)

## 2017-05-19 LAB — URIC ACID: Uric Acid, Serum: 7.8 mg/dL — ABNORMAL HIGH (ref 4.4–7.6)

## 2017-05-19 LAB — GLUCOSE, CAPILLARY: Glucose-Capillary: 448 mg/dL — ABNORMAL HIGH (ref 65–99)

## 2017-05-19 LAB — LIPASE, BLOOD
Lipase: 505 U/L — ABNORMAL HIGH (ref 11–51)
Lipase: 170 U/L — ABNORMAL HIGH (ref 11–51)

## 2017-05-19 LAB — TRIGLYCERIDES: Triglycerides: 157 mg/dL — ABNORMAL HIGH (ref <150)

## 2017-05-19 MED ORDER — ASPIRIN 81 MG PO CHEW
324.0000 mg | CHEWABLE_TABLET | Freq: Once | ORAL | Status: AC
Start: 2017-05-19 — End: 2017-05-19
  Administered 2017-05-19: 324 mg via ORAL
  Filled 2017-05-19: qty 4

## 2017-05-19 MED ORDER — TIOTROPIUM BROMIDE MONOHYDRATE 18 MCG IN CAPS
18.0000 ug | ORAL_CAPSULE | Freq: Every day | RESPIRATORY_TRACT | Status: DC
  Administered 2017-05-19 – 2017-05-20 (×2): 18 ug via RESPIRATORY_TRACT
  Filled 2017-05-19: qty 5

## 2017-05-19 MED ORDER — SPIRONOLACTONE 25 MG PO TABS
25.0000 mg | ORAL_TABLET | Freq: Every day | ORAL | Status: DC
  Administered 2017-05-19 – 2017-05-20 (×2): 25 mg via ORAL
  Filled 2017-05-19 (×2): qty 1

## 2017-05-19 MED ORDER — TORSEMIDE 20 MG PO TABS
20.0000 mg | ORAL_TABLET | Freq: Every day | ORAL | Status: DC
Start: 2017-05-19 — End: 2017-05-20
  Administered 2017-05-19 – 2017-05-20 (×2): 20 mg via ORAL
  Filled 2017-05-19 (×2): qty 1

## 2017-05-19 MED ORDER — INSULIN ASPART 100 UNIT/ML ~~LOC~~ SOLN
0.0000 [IU] | Freq: Four times a day (QID) | SUBCUTANEOUS | Status: DC
  Administered 2017-05-19: 15 [IU] via SUBCUTANEOUS
  Administered 2017-05-20 (×2): 8 [IU] via SUBCUTANEOUS
  Filled 2017-05-19 (×3): qty 1

## 2017-05-19 MED ORDER — ONDANSETRON HCL 4 MG PO TABS: 4.0000 mg | ORAL_TABLET | Freq: Four times a day (QID) | ORAL | Status: DC | PRN

## 2017-05-19 MED ORDER — METHYLPREDNISOLONE SODIUM SUCC 125 MG IJ SOLR
60.0000 mg | Freq: Every day | INTRAMUSCULAR | Status: DC
  Administered 2017-05-19 – 2017-05-20 (×2): 60 mg via INTRAVENOUS
  Filled 2017-05-19 (×2): qty 2

## 2017-05-19 MED ORDER — INSULIN GLARGINE 100 UNIT/ML ~~LOC~~ SOLN
12.0000 [IU] | Freq: Every day | SUBCUTANEOUS | Status: DC
  Administered 2017-05-19: 12 [IU] via SUBCUTANEOUS
  Filled 2017-05-19 (×2): qty 0.12

## 2017-05-19 MED ORDER — ACETAMINOPHEN 325 MG PO TABS
650.0000 mg | ORAL_TABLET | Freq: Four times a day (QID) | ORAL | Status: DC | PRN
  Administered 2017-05-19: 650 mg via ORAL
  Filled 2017-05-19: qty 2

## 2017-05-19 MED ORDER — MORPHINE SULFATE (PF) 2 MG/ML IV SOLN
2.0000 mg | INTRAVENOUS | Status: DC | PRN
Start: 2017-05-19 — End: 2017-05-20
  Administered 2017-05-19 (×3): 2 mg via INTRAVENOUS
  Filled 2017-05-19 (×3): qty 1

## 2017-05-19 MED ORDER — SODIUM CHLORIDE 0.9 % IV SOLN
INTRAVENOUS | Status: DC
  Administered 2017-05-19 (×2): via INTRAVENOUS

## 2017-05-19 MED ORDER — ONDANSETRON HCL 4 MG/2ML IJ SOLN: 4.0000 mg | Freq: Four times a day (QID) | INTRAMUSCULAR | Status: DC | PRN

## 2017-05-19 MED ORDER — METOPROLOL SUCCINATE ER 25 MG PO TB24
25.0000 mg | ORAL_TABLET | Freq: Every day | ORAL | Status: DC
  Administered 2017-05-19 – 2017-05-20 (×2): 25 mg via ORAL
  Filled 2017-05-19 (×2): qty 1

## 2017-05-19 MED ORDER — ENOXAPARIN SODIUM 40 MG/0.4ML ~~LOC~~ SOLN
40.0000 mg | Freq: Two times a day (BID) | SUBCUTANEOUS | Status: DC
  Administered 2017-05-19 – 2017-05-20 (×3): 40 mg via SUBCUTANEOUS
  Filled 2017-05-19 (×3): qty 0.4

## 2017-05-19 MED ORDER — ACETAMINOPHEN 650 MG RE SUPP: 650.0000 mg | Freq: Four times a day (QID) | RECTAL | Status: DC | PRN

## 2017-05-19 MED ORDER — SACUBITRIL-VALSARTAN 49-51 MG PO TABS
1.0000 | ORAL_TABLET | Freq: Two times a day (BID) | ORAL | Status: DC
  Administered 2017-05-19 – 2017-05-20 (×3): 1 via ORAL
  Filled 2017-05-19 (×3): qty 1

## 2017-05-19 NOTE — Progress Notes (Signed)
[place] [facility] [place] NAME: Oscar [name]    MR#:  [id number]  DATE OF BIRTH:  04-03-1976  SUBJECTIVE: Admitted for abdominal pain, acute pancreatitis. He also has severe right foot pain and unable to bear any weight. Going on for 2 days. No fever. Abdominal pain improving.   CHIEF COMPLAINT:   Chief Complaint  Patient presents with  . Abdominal Pain  . Chest Pain    REVIEW OF SYSTEMS:   ROS CONSTITUTIONAL: No fever, fatigue or weakness.  EYES: No. blurred or double vision.  EARS, NOSE, AND THROAT: No tinnitus or ear pain.  RESPIRATORY: No cough, shortness of breath, wheezing or hemoptysis.  CARDIOVASCULAR: No chest pain, orthopnea, edema.  GASTROINTESTINAL: No nausea, vomiting, diarrhea or abdominal pain.  GENITOURINARY: No dysuria, hematuria.  ENDOCRINE: No polyuria, nocturia,  HEMATOLOGY: No anemia, easy bruising or bleeding SKIN: No rash or lesion. MUSCULOSKELETAL: Right foot pain, unable to bear weight, unable to even tolerate putting socks on due to significant pain on the right  NEUROLOGIC: No tingling, numbness, weakness.  PSYCHIATRY: No anxiety or depression.   DRUG ALLERGIES:   Allergies  Allergen Reactions  . Bidil [Isosorb Dinitrate-Hydralazine] Other (See Comments)    Migraine Headache Migraine Headache    VITALS:  Blood pressure (!) 143/93, pulse 71, temperature 97.8 F (36.6 C), temperature source Oral, resp. rate 13, height 5\' 6"  (1.676 m), weight (!) 149.3 kg (329 lb 3.2 oz), SpO2 100 %.  PHYSICAL EXAMINATION:  GENERAL:  41 y.o.-year-old  obese male having right foot pain  EYES: Pupils equal, round, reactive to light  No scleral icterus. Extraocular muscles intact.  HEENT: Head atraumatic, normocephalic. Oropharynx and nasopharynx clear.  NECK:  Supple, no jugular venous distention. No thyroid enlargement, no tenderness.  LUNGS: Normal breath sounds bilaterally, no wheezing, rales,rhonchi or  crepitation. No use of accessory muscles of respiration.  CARDIOVASCULAR: S1, S2 normal. No murmurs, rubs, or gallops.  ABDOMEN: Soft, nontender, nondistended. Bowel sounds present. No organomegaly or mass.  EXTREMITIES: Pedal edema. Right foot is not warm to touch. But has exquisite tenderness present and range of motion is limited on the right fot pain. because of the pain, patient complains of pain mainly in the dorsum of the right foot. NEUROLOGIC: Cranial nerves II through XII are intact. Muscle strength 5/5 in all extremities. Sensation intact. Gait not checked.  PSYCHIATRIC: The patient is alert and oriented x 3.  SKIN: No obvious rash, lesion, or ulcer.    LABORATORY PANEL:   CBC  Recent Labs Lab 05/19/17 0259  WBC 7.3  HGB 14.0  HCT 38.7*  PLT 175   ------------------------------------------------------------------------------------------------------------------  Chemistries   Recent Labs Lab 05/19/17 0117 05/19/17 0259  NA  --  136  K  --  4.1  CL  --  100*  CO2  --  28  GLUCOSE  --  218*  BUN  --  16  CREATININE  --  0.98  CALCIUM  --  8.7*  AST 26  --   ALT 24  --   ALKPHOS 196*  --   BILITOT 0.5  --    ------------------------------------------------------------------------------------------------------------------  Cardiac Enzymes  Recent Labs Lab 05/19/17 0847  TROPONINI 0.08*   ------------------------------------------------------------------------------------------------------------------  RADIOLOGY:  Dg Chest 2 View  Result Date: 05/18/2017 CLINICAL DATA:  Chest pain EXAM: CHEST  2 VIEW COMPARISON:  03/01/2017 FINDINGS: Mild cardiomegaly with central vascular congestion. No focal pneumonia, collapse or consolidation. Negative for edema, effusion  or pneumothorax. Trachea is midline. Left subclavian 2 lead pacer noted. IMPRESSION: Cardiomegaly with vascular congestion Electronically Signed   By: Jerilynn Mages.  Shick M.D.   On: 05/18/2017 22:39   Dg Foot  Complete Right  Result Date: 05/18/2017 CLINICAL DATA:  Right foot pain.  No known injury. EXAM: RIGHT FOOT COMPLETE - 3+ VIEW COMPARISON:  Right foot radiograph 08/14/2015 FINDINGS: No fracture or dislocation of the right foot. Bone density is normal. Small plantar and posterior calcaneal enthesophytes. Normal joint spaces. IMPRESSION: No acute abnormality of the right foot. Electronically Signed   By: Ulyses Jarred M.D.   On: 05/18/2017 23:55   US Abdomen Limited Ruq  Result Date: 05/19/2017 CLINICAL DATA:  Abdominal pain history of pancreatitis EXAM: ULTRASOUND ABDOMEN LIMITED RIGHT UPPER QUADRANT COMPARISON:  01/29/2017 FINDINGS: Gallbladder: No gallstones or wall thickening visualized. No sonographic Murphy sign noted by sonographer. Common bile duct: Diameter: 5.1 mm Liver: Slight increased echogenicity suggesting fatty infiltration. Portal vein is patent on color Doppler imaging with normal direction of blood flow towards the liver. IMPRESSION: Negative for gallstones or biliary dilatation. Slight increased echogenicity of the liver suggests fatty infiltration Electronically Signed   By: Donavan Foil M.D.   On: 05/19/2017 01:21    EKG:   Orders placed or performed during the hospital encounter of 05/18/17  . EKG 12-Lead  . EKG 12-Lead  . ED EKG within 10 minutes  . ED EKG within 10 minutes    ASSESSMENT AND PLAN:   #1 acute abdominal pain secondary to acute pancreatitis. Patient has recurrent idiopathic pancreatitis. Consultant gastroenterology, check repeat lipase because patient says his abdominal pain is better today., These are normal, ultrasound of abdomen showed no gallstones.  2 history of chronic systolic heart failure: Stable at this time, no shortness of breath, continue beta blockers, entresto, Aldactone Respiratory history of COPD: No wheezing. Continue Spiriva #3 diabetes mellitus type 2: Continue sliding-scale insulin with coverage, hold Lantus. As patient is nothing  by mouth. Metformin also is on hold due to Nothing by mouth status. Resume them when patient starts to eat. Acute right foot pain: X-ray of the right foot is normal. Possible acute gout: Check uric acid, start IV steroids, IV pain medicines. Discussed with  sister, registered nurse.   All the records are reviewed and case discussed with Care Management/Social Workerr. Management plans discussed with the patient, family and they are in agreement.  CODE STATUS: Full code  TOTAL TIME TAKING CARE OF THIS PATIENT: 35 minutes.   POSSIBLE D/C IN 1-2DAYS, DEPENDING ON CLINICAL CONDITION.   Epifanio Lesches M.D on 05/19/2017 at 11:54 AM  Between 7am to 6pm - Pager - [phone]  After 6pm go to [email or web address] - password [email or web address] [facility]  Office  [phone]  CC: Primary care physician; [facility], [place]   Note: This dictation was prepared with Diplomatic Services operational officer dictation along with smaller phrase technology. Any transcriptional errors that result from this process are unintentional.

## 2017-05-19 NOTE — Consult Note (Signed)
Oscar Bellows MD, MRCP(U.K) [address]  [place]  [place], [place] [address]  Main: [phone]  Fax: [phone]  Consultation  Referring Provider:  DR Vianne Bulls Primary Care Physician:  [facility] Primary Gastroenterologist:  [facility]          Reason for Consultation:     Pancreatitis   Date of Admission:  05/18/2017 Date of Consultation:  05/19/2017         HPI:   Oscar [name] is a 41 y.o. male presented to the ER yesterday with abdominal pain of 2 weeks duration . On admission Lipase was 1104 ,alkaline phosphotase was elevated at 196 with normal T bilirubin . He has had a history of pancreatitis in the past, says that he has had at  least 12 episodes, no clear cause identified and says he has not actually been evaluated for the same.Denies any alcohol use, no family history of pancreatitis or pancreatic cancer . Says he developed epigastric abdominal pain two weeks back similar to prior episodes and brought him into hospital. Much better since coming into the hospital.   RUQ USG done today shows no biliary dilation  , no gall stones , fatty infiltration of the liver. CT scan of the abdomen in 07/2016 showed mild pancreatitis , possible mild cirrhosis.   Past Medical History:  Diagnosis Date  . Asthma   . Cardiomyopathy ([place])   . CHF (congestive heart failure) ([place])   . Coronary artery disease   . Deafness in right ear   . Diabetes mellitus without complication ([place])   . Dysrhythmia    svt  . 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 GERD (gastroesophageal reflux disease)   . Hyperlipidemia   . Hypertension   . Hypoxemia   . Myocardial infarction ([place])    [id number]/79  . Pancreatitis   . Sleep apnea    osa    Past Surgical History:  Procedure Laterality Date  . CARDIAC CATHETERIZATION  12/11/2014   Procedure: RIGHT/LEFT HEART CATH AND CORONARY ANGIOGRAPHY;  Surgeon: Lorretta Harp, MD;  Location: [facility] CATH LAB;   Service: Cardiovascular;;  . ICD LEAD REMOVAL N/A 03/30/2015   Procedure: ICD LEAD REMOVAL;  Surgeon: Marzetta Board, MD;  Location: [facility] ORS;  Service: Cardiovascular;  Laterality: N/A;  . IMPLANTABLE CARDIOVERTER DEFIBRILLATOR IMPLANT    . INSERT / REPLACE / REMOVE PACEMAKER    . LEFT HEART CATHETERIZATION WITH CORONARY ANGIOGRAM N/A 12/09/2014   Procedure: LEFT HEART CATHETERIZATION WITH CORONARY ANGIOGRAM;  Surgeon: Burnell Blanks, MD;  Location: [facility] CATH LAB;  Service: Cardiovascular;  Laterality: N/A;    Prior to Admission medications   Medication Sig Start Date End Date Taking? Authorizing Provider  aspirin EC 325 MG tablet Take 1 tablet (325 mg total) by mouth daily. Patient taking differently: Take 81 mg by mouth daily.  07/10/15  Yes Aldean Jewett, MD  atorvastatin (LIPITOR) 40 MG tablet Take 1 tablet (40 mg total) by mouth daily at 6 PM. 09/13/16  Yes Gouru, Aruna, MD  insulin glargine (LANTUS) 100 UNIT/ML injection Inject 25 Units into the skin at bedtime.    Yes [provider]  insulin starter kit- syringes MISC 1 kit by Other route once. 07/10/15  Yes Aldean Jewett, MD  metFORMIN (GLUCOPHAGE) 1000 MG tablet Take 1,000 mg by mouth 2 (two) times daily with a meal.   Yes [provider]  metoprolol succinate (TOPROL XL) 25  MG 24 hr tablet Take 1 tablet (25 mg total) by mouth daily. 12/04/16  Yes Darylene Price A, FNP  omeprazole (PRILOSEC) 20 MG capsule Take 1 capsule (20 mg total) by mouth daily. 04/01/16  Yes McGowan, Larene Beach A, PA-C  polyethylene glycol (MIRALAX / GLYCOLAX) packet Take 17 g by mouth daily.   Yes [provider]  sacubitril-valsartan (ENTRESTO) 49-51 MG Take 1 tablet by mouth 2 (two) times daily. 10/29/16  Yes Darylene Price A, FNP  spironolactone (ALDACTONE) 25 MG tablet Take 1 tablet (25 mg total) by mouth daily. 09/19/16 01/29/18 Yes Hackney, Otila Kluver A, FNP  Syringe, Disposable, 1 ML MISC 0.1 mLs by Does not apply route as directed.  08/21/15  Yes Darylene Price A, FNP  tiotropium (SPIRIVA) 18 MCG inhalation capsule Place 18 mcg into inhaler and inhale daily.   Yes [provider]  torsemide (DEMADEX) 20 MG tablet Take 20 mg by mouth daily.    Yes [provider]  ondansetron (ZOFRAN ODT) 8 MG disintegrating tablet Take 1 tablet (8 mg total) by mouth every 8 (eight) hours as needed for nausea or vomiting. Patient not taking: Reported on 05/19/2017 01/29/17   Darel Hong, MD  ondansetron (ZOFRAN) 4 MG tablet Take 1 tablet (4 mg total) by mouth every 8 (eight) hours as needed for nausea or vomiting. Patient not taking: Reported on 05/19/2017 03/23/17   Rudene Re, MD  oxyCODONE-acetaminophen (ROXICET) 5-325 MG tablet Take 1 tablet by mouth every 6 (six) hours as needed. Patient not taking: Reported on 05/19/2017 03/23/17 03/23/18  Rudene Re, MD    Family History  Problem Relation Age of Onset  . Hypertension Mother   . Congestive Heart Failure Mother   . Hypertension Sister   . Diabetes Sister   . Pancreatitis Sister   . COPD Sister   . Pancreatitis Brother   . Anemia Neg Hx   . Arrhythmia Neg Hx   . Asthma Neg Hx   . Clotting disorder Neg Hx   . Fainting Neg Hx   . Heart attack Neg Hx   . Heart disease Neg Hx   . Heart failure Neg Hx   . Hyperlipidemia Neg Hx      Social History  Substance Use Topics  . Smoking status: Current Every Day Smoker    Packs/day: 0.50    Years: 24.00    Types: Cigarettes  . Smokeless tobacco: Never Used     Comment: 4/5 Smoking 5 cigs a day  . Alcohol use No    Allergies as of 05/18/2017 - Review Complete 05/18/2017  Allergen Reaction Noted  . Bidil [isosorb dinitrate-hydralazine] Other (See Comments) 04/15/2016    Review of Systems:    All systems reviewed and negative except where noted in HPI.   Physical Exam:  Vital signs in last 24 hours: Temp:  [97.8 F (36.6 C)-98.7 F (37.1 C)] 98.4 F (36.9 C) (09/18 1212) Pulse Rate:   [62-92] 62 (09/18 1212) Resp:  [9-22] 18 (09/18 1212) BP: (143-200)/(83-107) 155/91 (09/18 1212) SpO2:  [94 %-100 %] 99 % (09/18 1212) Weight:  [329 lb 3.2 oz (149.3 kg)-330 lb (149.7 kg)] 329 lb 3.2 oz (149.3 kg) (09/18 0246) Last BM Date: 05/17/17 General:   Pleasant, cooperative in NAD Head:  Normocephalic and atraumatic. Eyes:   No icterus.   Conjunctiva pink. PERRLA. Ears:  Normal auditory acuity. Neck:  Supple; no masses or thyroidomegaly Lungs: Respirations even and unlabored. Lungs clear to auscultation bilaterally.   No wheezes, crackles,  or rhonchi.  Heart:  Regular rate and rhythm;  Without murmur, clicks, rubs or gallops Abdomen:  Soft, nondistended, nontender. Normal bowel sounds. No appreciable masses or hepatomegaly.  No rebound or guarding.  Rectal:  Not performed. Neurologic:  Alert and oriented x3;  grossly normal neurologically. Skin:  Intact without significant lesions or rashes. Cervical Nodes:  No significant cervical adenopathy. Psych:  Alert and cooperative. Normal affect.  LAB RESULTS:  Recent Labs  05/18/17 2207 05/19/17 0259  WBC 8.2 7.3  HGB 14.5 14.0  HCT 40.1 38.7*  PLT 193 175   BMET  Recent Labs  05/18/17 2207 05/19/17 0259  NA 134* 136  K 4.1 4.1  CL 100* 100*  CO2 25 28  GLUCOSE 208* 218*  BUN 16 16  CREATININE 0.93 0.98  CALCIUM 9.2 8.7*   LFT  Recent Labs  05/19/17 0117  PROT 8.0  ALBUMIN 3.6  AST 26  ALT 24  ALKPHOS 196*  BILITOT 0.5  BILIDIR 0.1  IBILI 0.4   PT/INR No results for input(s): LABPROT, INR in the last 72 hours.  STUDIES: Dg Chest 2 View  Result Date: 05/18/2017 CLINICAL DATA:  Chest pain EXAM: CHEST  2 VIEW COMPARISON:  03/01/2017 FINDINGS: Mild cardiomegaly with central vascular congestion. No focal pneumonia, collapse or consolidation. Negative for edema, effusion or pneumothorax. Trachea is midline. Left subclavian 2 lead pacer noted. IMPRESSION: Cardiomegaly with vascular congestion Electronically  Signed   By: Jerilynn Mages.  Shick M.D.   On: 05/18/2017 22:39   Dg Foot Complete Right  Result Date: 05/18/2017 CLINICAL DATA:  Right foot pain.  No known injury. EXAM: RIGHT FOOT COMPLETE - 3+ VIEW COMPARISON:  Right foot radiograph 08/14/2015 FINDINGS: No fracture or dislocation of the right foot. Bone density is normal. Small plantar and posterior calcaneal enthesophytes. Normal joint spaces. IMPRESSION: No acute abnormality of the right foot. Electronically Signed   By: Ulyses Jarred M.D.   On: 05/18/2017 23:55   US Abdomen Limited Ruq  Result Date: 05/19/2017 CLINICAL DATA:  Abdominal pain history of pancreatitis EXAM: ULTRASOUND ABDOMEN LIMITED RIGHT UPPER QUADRANT COMPARISON:  01/29/2017 FINDINGS: Gallbladder: No gallstones or wall thickening visualized. No sonographic Murphy sign noted by sonographer. Common bile duct: Diameter: 5.1 mm Liver: Slight increased echogenicity suggesting fatty infiltration. Portal vein is patent on color Doppler imaging with normal direction of blood flow towards the liver. IMPRESSION: Negative for gallstones or biliary dilatation. Slight increased echogenicity of the liver suggests fatty infiltration Electronically Signed   By: Donavan Foil M.D.   On: 05/19/2017 01:21      Impression / Plan:   Oscar [name] is a 41 y.o. y/o male with admitted with acute pancreatitis. History is suggestive of prior pancreatitis. LFT's are normal except for mildly elevated alkaline phosphotase which may be related to his pancreatitis or may be related to his foot pain and be from a bone source. He doesn't seemed to have been evaluated for the etiology of pancreatitis in the past .   Plan  1. Repeat Hepatic function panel and GGT tomorrow 2. Check IGG4 and TGL  3. He will need a MRCP to r/o pancreatic masses/nodules/divisum   4. Advance diet as tolerated.  5. F/u with GI clinic at [facility] as an outpatient    Thank you for involving me in the care of this patient.      LOS: 0  days   Oscar Bellows, MD  05/19/2017, 2:51 PM

## 2017-05-19 NOTE — Progress Notes (Signed)
Pt started on CL diet today, pt tolerating diet well.

## 2017-05-19 NOTE — Progress Notes (Signed)
Pt blood sugar found to be 448. MD Vianne Bulls notified.orders received to give 15 units of insulin per sliding scale. Pt started on IV solumedrol today.Will continue to monitor.

## 2017-05-19 NOTE — ED Provider Notes (Signed)
 [facility] Emergency Department Provider Note   ____________________________________________   First MD Initiated Contact with Patient 05/18/17 2313     (approximate)  I have reviewed the triage vital signs and the nursing notes.   HISTORY  Chief Complaint Abdominal Pain and Chest Pain    HPI Oscar [name] is a 41 y.o. male who comes into the hospital today with some abdominal pain and right foot and ankle pain. The patient reports that the pain is making his chest tight. The patient states that when he started feeling the chest tightness he called the ambulance. The pain is mainly in his abdomen. He's been having these symptoms for the past 2 days. The patient states that he has pain in his epigastric area and the small hernia. He reports that his stomach has been causing him problems for 2 years. The patient has a history of pancreatitis and he's not sure exactly why he gets it. He reports that he didn't take his aspirin today. He's had some decreased appetite and dry heaving. He denies any trauma to his foot but states that the pain started in his big toe and his condom moved into his foot and ankle. The patient denies a history of gout. He rates his pain 8 out of 10 in intensity. He's had diarrhea for a month. The patient is here today for evaluation of his symptoms.   Past Medical History:  Diagnosis Date  . Asthma   . Cardiomyopathy ([facility])   . CHF (congestive heart failure) ([facility])   . Coronary artery disease   . Deafness in right ear   . Diabetes mellitus without complication ([facility])   . Dysrhythmia    svt  . 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 GERD (gastroesophageal reflux disease)   . Hyperlipidemia   . Hypertension   . Hypoxemia   . Myocardial infarction ([facility])    2012,2014,2/16  . Pancreatitis   . Sleep apnea    osa    Patient Active Problem List   Diagnosis Date Noted  . Pancreatitis 09/12/2016  . Bronchitis 05/20/2016  .  Constipation 11/08/2015  . Abdominal pain 11/02/2015  . Obstructive sleep apnea 07/31/2015  . Tobacco use 07/31/2015  . Diabetes (HCC) 07/10/2015  . Chronic systolic heart failure ([facility]) 03/30/2015  . Cardiac pacemaker 03/30/2015  . Chronic respiratory failure with hypercapnia (HCC) 12/12/2014  . Elevated troponin   . Non-ischemic cardiomyopathy (H[facility])   . Hypoxemia   . SVT (supraventricular tachycardia) (H[facility]) 12/09/2014  . NSTEMI (non-ST elevated myocardial infarction) (H[facility]) 12/09/2014  . Hypertension 12/09/2014    Past Surgical History:  Procedure Laterality Date  . CARDIAC CATHETERIZATION  12/11/2014   Procedure: RIGHT/LEFT HEART CATH AND CORONARY ANGIOGRAPHY;  Surgeon: Jonathan J Berry, MD;  Location: [facility] CATH LAB;  Service: Cardiovascular;;  . ICD LEAD REMOVAL N/A 03/30/2015   Procedure: ICD LEAD REMOVAL;  Surgeon: Kevin L Thomas, MD;  Location: [facility] ORS;  Service: Cardiovascular;  Laterality: N/A;  . IMPLANTABLE CARDIOVERTER DEFIBRILLATOR IMPLANT    . INSERT / REPLACE / REMOVE PACEMAKER    . LEFT HEART CATHETERIZATION WITH CORONARY ANGIOGRAM N/A 12/09/2014   Procedure: LEFT HEART CATHETERIZATION WITH CORONARY ANGIOGRAM;  Surgeon: Christopher D McAlhany, MD;  Location: [facility] CATH LAB;  Service: Cardiovascular;  Laterality: N/A;    Prior to Admission medications   Medication Sig Start Date End Date Taking? Authorizing Provider  aspirin EC 325 MG tablet Take 1 tablet (325 mg total)   by mouth daily. 07/10/15   Walsh, Catherine P, MD  atorvastatin (LIPITOR) 40 MG tablet Take 1 tablet (40 mg total) by mouth daily at 6 PM. 09/13/16   Gouru, Aruna, MD  insulin glargine (LANTUS) 100 UNIT/ML injection Inject 56 Units into the skin at bedtime.     [provider]  insulin starter kit- syringes MISC 1 kit by Other route once. 07/10/15   Walsh, Catherine P, MD  metFORMIN (GLUCOPHAGE) 1000 MG tablet Take 1,000 mg by mouth 2 (two) times daily with a meal.    [provider]  metoprolol  succinate (TOPROL XL) 25 MG 24 hr tablet Take 1 tablet (25 mg total) by mouth daily. 12/04/16   Hackney, Tina A, FNP  omeprazole (PRILOSEC) 20 MG capsule Take 1 capsule (20 mg total) by mouth daily. 04/01/16   McGowan, Shannon A, PA-C  ondansetron (ZOFRAN ODT) 8 MG disintegrating tablet Take 1 tablet (8 mg total) by mouth every 8 (eight) hours as needed for nausea or vomiting. 01/29/17   Rifenbark, Neil, MD  ondansetron (ZOFRAN) 4 MG tablet Take 1 tablet (4 mg total) by mouth every 8 (eight) hours as needed for nausea or vomiting. 03/23/17   Veronese, Logan, MD  oxyCODONE-acetaminophen (ROXICET) 5-325 MG tablet Take 1 tablet by mouth every 6 (six) hours as needed. 03/23/17 03/23/18  Veronese, [place], MD  polyethylene glycol (MIRALAX / GLYCOLAX) packet Take 17 g by mouth daily.    [provider]  sacubitril-valsartan (ENTRESTO) 49-51 MG Take 1 tablet by mouth 2 (two) times daily. 10/29/16   Hackney, Tina A, FNP  spironolactone (ALDACTONE) 25 MG tablet Take 1 tablet (25 mg total) by mouth daily. 09/19/16 01/29/18  Hackney, Tina A, FNP  Syringe, Disposable, 1 ML MISC 0.1 mLs by Does not apply route as directed. 08/21/15   Hackney, Tina A, FNP  tiotropium (SPIRIVA) 18 MCG inhalation capsule Place 18 mcg into inhaler and inhale daily.    [provider]  torsemide (DEMADEX) 20 MG tablet Take 20 mg by mouth daily.     [provider]    Allergies Bidil [isosorb dinitrate-hydralazine]  Family History  Problem Relation Age of Onset  . Hypertension Mother   . Congestive Heart Failure Mother   . Hypertension Sister   . Diabetes Sister   . Pancreatitis Sister   . COPD Sister   . Pancreatitis Brother   . Anemia Neg Hx   . Arrhythmia Neg Hx   . Asthma Neg Hx   . Clotting disorder Neg Hx   . Fainting Neg Hx   . Heart attack Neg Hx   . Heart disease Neg Hx   . Heart failure Neg Hx   . Hyperlipidemia Neg Hx     Social History Social History  Substance Use Topics  .  Smoking status: Current Every Day Smoker    Packs/day: 0.50    Years: 24.00    Types: Cigarettes  . Smokeless tobacco: Never Used     Comment: 4/5 Smoking 5 cigs a day  . Alcohol use No    Review of Systems  Constitutional: No fever/chills Eyes: No visual changes. ENT: No sore throat. Cardiovascular:  chest pain. Respiratory:  shortness of breath. Gastrointestinal:  abdominal pain.   nausea, no vomiting.  No diarrhea.  No constipation. Genitourinary: Negative for dysuria. Musculoskeletal: right foot pain Skin: Negative for rash. Neurological: Negative for headaches, focal weakness or numbness.   ____________________________________________   PHYSICAL EXAM:  VITAL SIGNS: ED Triage Vitals    Enc Vitals Group     BP 05/18/17 2205 (!) 200/107     Pulse Rate 05/18/17 2205 92     Resp 05/18/17 2205 (!) 22     Temp 05/18/17 2205 98.7 F (37.1 C)     Temp Source 05/18/17 2205 Oral     SpO2 05/18/17 2205 94 %     Weight 05/18/17 2206 (!) 330 lb (149.7 kg)     Height 05/18/17 2206 5' 6" (1.676 m)     Head Circumference --      Peak Flow --      Pain Score 05/18/17 2205 10     Pain Loc --      Pain Edu? --      Excl. in [place]? --     Constitutional: Alert and oriented. Well appearing and in moderate distress. Eyes: Conjunctivae are normal. PERRL. EOMI. Head: Atraumatic. Nose: No congestion/rhinnorhea. Mouth/Throat: Mucous membranes are moist.  Oropharynx non-erythematous. Cardiovascular: Normal rate, regular rhythm. Grossly normal heart sounds.  Good peripheral circulation. Respiratory: Normal respiratory effort.  No retractions. Lungs CTAB. Gastrointestinal: Soft with some epigastric tenderness to palpation. No distention. positive bowel sounds Musculoskeletal: No lower extremity tenderness nor edema.  tenderness to palpation of right toe and foot without any significant toe redness or swelling. Neurologic:  Normal speech and language.  Skin:  Skin is warm, dry and intact.    Psychiatric: Mood and affect are normal.   ____________________________________________   LABS (all labs ordered are listed, but only abnormal results are displayed)  Labs Reviewed  BASIC METABOLIC PANEL - Abnormal; Notable for the following:       Result Value   Sodium 134 (*)    Chloride 100 (*)    Glucose, Bld 208 (*)    All other components within normal limits  CBC - Abnormal; Notable for the following:    RBC 4.33 (*)    MCHC 36.1 (*)    All other components within normal limits  TROPONIN I - Abnormal; Notable for the following:    Troponin I 0.09 (*)    All other components within normal limits  LIPASE, BLOOD - Abnormal; Notable for the following:    Lipase 1,104 (*)    All other components within normal limits  BRAIN NATRIURETIC PEPTIDE - Abnormal; Notable for the following:    B Natriuretic Peptide 156.0 (*)    All other components within normal limits  TROPONIN I  HEPATIC FUNCTION PANEL  URIC ACID   ____________________________________________  EKG  ED ECG REPORT I, Loney Hering, the attending physician, personally viewed and interpreted this ECG.   Date: 05/18/2017  EKG Time: 2159  Rate: 95  Rhythm: normal sinus rhythm  Axis: left axis deviation  Intervals:none  ST&T Change: Flipped T waves in lead aVL and I, no ST segment elevation  ____________________________________________  RADIOLOGY  Dg Chest 2 View  Result Date: 05/18/2017 CLINICAL DATA:  Chest pain EXAM: CHEST  2 VIEW COMPARISON:  03/01/2017 FINDINGS: Mild cardiomegaly with central vascular congestion. No focal pneumonia, collapse or consolidation. Negative for edema, effusion or pneumothorax. Trachea is midline. Left subclavian 2 lead pacer noted. IMPRESSION: Cardiomegaly with vascular congestion Electronically Signed   By: Jerilynn Mages.  Shick M.D.   On: 05/18/2017 22:39   Dg Foot Complete Right  Result Date: 05/18/2017 CLINICAL DATA:  Right foot pain.  No known injury. EXAM: RIGHT FOOT  COMPLETE - 3+ VIEW COMPARISON:  Right foot radiograph 08/14/2015 FINDINGS: No fracture or dislocation of  the right foot. Bone density is normal. Small plantar and posterior calcaneal enthesophytes. Normal joint spaces. IMPRESSION: No acute abnormality of the right foot. Electronically Signed   By: Kevin  Herman M.D.   On: 05/18/2017 23:55   Us Abdomen Limited Ruq  Result Date: 05/19/2017 CLINICAL DATA:  Abdominal pain history of pancreatitis EXAM: ULTRASOUND ABDOMEN LIMITED RIGHT UPPER QUADRANT COMPARISON:  01/29/2017 FINDINGS: Gallbladder: No gallstones or wall thickening visualized. No sonographic Murphy sign noted by sonographer. Common bile duct: Diameter: 5.1 mm Liver: Slight increased echogenicity suggesting fatty infiltration. Portal vein is patent on color Doppler imaging with normal direction of blood flow towards the liver. IMPRESSION: Negative for gallstones or biliary dilatation. Slight increased echogenicity of the liver suggests fatty infiltration Electronically Signed   By: Kim  Fujinaga M.D.   On: 05/19/2017 01:21    ____________________________________________   PROCEDURES  Procedure(s) performed: None  Procedures  Critical Care performed: No  ____________________________________________   INITIAL IMPRESSION / ASSESSMENT AND PLAN / ED COURSE  Pertinent labs & imaging results that were available during my care of the patient were reviewed by me and considered in my medical decision making (see chart for details).  this is a 40-year-old male who comes into the hospital today with some abdominal pain chest pain and ankle pain. The patient was brought from triage after his troponin was found to be 0.09. I did add a lipase and hepatic function panel onto the patient's blood work given his history of pancreatitis. When I received the results the patient's lipase was over 1100. I gave the patient a liter of normal saline as well as some morphine and Zofran. I sent the patient for  an ultrasound to evaluate for biliary disease as well. The patient's ultrasound is unremarkable but given the patient's pancreatitis and elevated troponin I will admit him to the hospital for further evaluation of his symptoms and treatment. also given the patient's symptoms I feel that he may have gout. At this time I am still awaiting the results of his uric acid. He will be admitted.       ____________________________________________   FINAL CLINICAL IMPRESSION(S) / ED DIAGNOSES  Final diagnoses:  Abdominal pain  Acute pancreatitis, unspecified complication status, unspecified pancreatitis type  Chest pain, unspecified type  Elevated troponin      NEW MEDICATIONS STARTED DURING THIS VISIT:  New Prescriptions   No medications on file     Note:  This document was prepared using Dragon voice recognition software and may include unintentional dictation errors.    Webster, Allison P, MD 05/19/17 0139  

## 2017-05-19 NOTE — Progress Notes (Signed)
Lovenox changed to 40 mg BID for BMI >40 and CrCl >30. 

## 2017-05-19 NOTE — Progress Notes (Signed)
Inpatient Diabetes Program Recommendations  AACE/ADA: New Consensus Statement on Inpatient Glycemic Control (2015)  Target Ranges:  Prepandial:   less than 140 mg/dL      Peak postprandial:   less than 180 mg/dL (1-2 hours)      Critically ill patients:  140 - 180 mg/dL   Results for LAMARK, SCHUE (MRN [id number]) as of 05/19/2017 09:35  Ref. Range 05/18/2017 22:07 05/19/2017 02:59  Glucose Latest Ref Range: 65 - 99 mg/dL 208 (H) 218 (H)   Review of Glycemic Control  Diabetes history: DM2 Outpatient Diabetes medications: Lantus 56 units QHS, Metformin 1000 mg BID Current orders for Inpatient glycemic control: None  Inpatient Diabetes Program Recommendations: Insulin - Basal: If glucose is consistently greater than 180 mg/dl with Novolog correction scale, please consider ordeirng Lantus 15 units Q24H (based on 149 kg x 0.1 units). Correction (SSI): Please consider ordering CBGs with Novolog 0-15 units Q4H. A1C: Please consider ordering an A1C to evaluate glycemic control over the past 2-3 months.  Thanks, Barnie Alderman, RN, MSN, CDE Diabetes Coordinator Inpatient Diabetes Program [phone] (Team Pager from 8am to 5pm)

## 2017-05-19 NOTE — H&P (Signed)
[facility] [place] NAME: Oscar [name]    MR#:  [id number]  DATE OF BIRTH:  02/01/1976  DATE OF ADMISSION:  05/18/2017  PRIMARY CARE PHYSICIAN: [facility] [place]   REQUESTING/REFERRING PHYSICIAN:   CHIEF COMPLAINT:   Chief Complaint  Patient presents with  . Abdominal Pain  . Chest Pain    HISTORY OF PRESENT ILLNESS: Oscar [name]  is a 41 y.o. male with a known history of Bronchial asthma, congestive heart failure, cardiomyopathy, coronary artery disease presented to the emergency room with epigastric pain. The abdominal pain is going on for the last 2 weeks and has been worse for the last 2 days. Pain is located in the epigastrium and radiates to the chest. Pain is sharp in nature ,8 out of 10 on a scale of 1-10. Patient also complaints of pain in the right foot in the big toe and has history of gout. Patient also has history of pancreatitis in the past with unknown etiology. No complaints of any shortness of breath. Has dry heaves. During the workup in the emergency room lipase level is elevated. Hospitalist service was consulted for further care of the patient.  PAST MEDICAL HISTORY:   Past Medical History:  Diagnosis Date  . Asthma   . Cardiomyopathy ([place])   . CHF (congestive heart failure) ([place])   . Coronary artery disease   . Deafness in right ear   . Diabetes mellitus without complication ([facility])   . Dysrhythmia    svt  . 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 GERD (gastroesophageal reflux disease)   . Hyperlipidemia   . Hypertension   . Hypoxemia   . Myocardial infarction ([place])    [id number],5/61  . Pancreatitis   . Sleep apnea    osa    PAST SURGICAL HISTORY: Past Surgical History:  Procedure Laterality Date  . CARDIAC CATHETERIZATION  12/11/2014   Procedure: RIGHT/LEFT HEART CATH AND CORONARY ANGIOGRAPHY;  Surgeon: Lorretta Harp, MD;  Location: [facility] CATH LAB;  Service:  Cardiovascular;;  . ICD LEAD REMOVAL N/A 03/30/2015   Procedure: ICD LEAD REMOVAL;  Surgeon: Marzetta Board, MD;  Location: [facility] ORS;  Service: Cardiovascular;  Laterality: N/A;  . IMPLANTABLE CARDIOVERTER DEFIBRILLATOR IMPLANT    . INSERT / REPLACE / REMOVE PACEMAKER    . LEFT HEART CATHETERIZATION WITH CORONARY ANGIOGRAM N/A 12/09/2014   Procedure: LEFT HEART CATHETERIZATION WITH CORONARY ANGIOGRAM;  Surgeon: Burnell Blanks, MD;  Location: [facility] CATH LAB;  Service: Cardiovascular;  Laterality: N/A;    SOCIAL HISTORY:  Social History  Substance Use Topics  . Smoking status: Current Every Day Smoker    Packs/day: 0.50    Years: 24.00    Types: Cigarettes  . Smokeless tobacco: Never Used     Comment: 4/5 Smoking 5 cigs a day  . Alcohol use No    FAMILY HISTORY:  Family History  Problem Relation Age of Onset  . Hypertension Mother   . Congestive Heart Failure Mother   . Hypertension Sister   . Diabetes Sister   . Pancreatitis Sister   . COPD Sister   . Pancreatitis Brother   . Anemia Neg Hx   . Arrhythmia Neg Hx   . Asthma Neg Hx   . Clotting disorder Neg Hx   . Fainting Neg Hx   . Heart attack Neg Hx   . Heart disease Neg Hx   . Heart failure  Neg Hx   . Hyperlipidemia Neg Hx     DRUG ALLERGIES:  Allergies  Allergen Reactions  . Bidil [Isosorb Dinitrate-Hydralazine] Other (See Comments)    Migraine Headache Migraine Headache    REVIEW OF SYSTEMS:   CONSTITUTIONAL: No fever, fatigue or weakness.  EYES: No blurred or double vision.  EARS, NOSE, AND THROAT: No tinnitus or ear pain.  RESPIRATORY: No cough, shortness of breath, wheezing or hemoptysis.  CARDIOVASCULAR: No chest pain, orthopnea, edema.  GASTROINTESTINAL: Has nausea, no vomiting, diarrhea  Has abdominal pain.  GENITOURINARY: No dysuria, hematuria.  ENDOCRINE: No polyuria, nocturia,  HEMATOLOGY: No anemia, easy bruising or bleeding SKIN: No rash or lesion. MUSCULOSKELETAL: Has pain in right foot  , big toe area NEUROLOGIC: No tingling, numbness, weakness.  PSYCHIATRY: No anxiety or depression.   MEDICATIONS AT HOME:  Prior to Admission medications   Medication Sig Start Date End Date Taking? Authorizing Provider  aspirin EC 325 MG tablet Take 1 tablet (325 mg total) by mouth daily. 07/10/15   Aldean Jewett, MD  atorvastatin (LIPITOR) 40 MG tablet Take 1 tablet (40 mg total) by mouth daily at 6 PM. 09/13/16   Gouru, Aruna, MD  insulin glargine (LANTUS) 100 UNIT/ML injection Inject 56 Units into the skin at bedtime.     [provider]  insulin starter kit- syringes MISC 1 kit by Other route once. 07/10/15   Aldean Jewett, MD  metFORMIN (GLUCOPHAGE) 1000 MG tablet Take 1,000 mg by mouth 2 (two) times daily with a meal.    [provider]  metoprolol succinate (TOPROL XL) 25 MG 24 hr tablet Take 1 tablet (25 mg total) by mouth daily. 12/04/16   Alisa Graff, FNP  omeprazole (PRILOSEC) 20 MG capsule Take 1 capsule (20 mg total) by mouth daily. 04/01/16   Zara Council A, PA-C  ondansetron (ZOFRAN ODT) 8 MG disintegrating tablet Take 1 tablet (8 mg total) by mouth every 8 (eight) hours as needed for nausea or vomiting. 01/29/17   Darel Hong, MD  ondansetron (ZOFRAN) 4 MG tablet Take 1 tablet (4 mg total) by mouth every 8 (eight) hours as needed for nausea or vomiting. 03/23/17   Alfred Levins, [place], MD  oxyCODONE-acetaminophen (ROXICET) 5-325 MG tablet Take 1 tablet by mouth every 6 (six) hours as needed. 03/23/17 03/23/18  Rudene Re, MD  polyethylene glycol [facility] / Floria Raveling) packet Take 17 g by mouth daily.    [provider]  sacubitril-valsartan (ENTRESTO) 49-51 MG Take 1 tablet by mouth 2 (two) times daily. 10/29/16   Alisa Graff, FNP  spironolactone (ALDACTONE) 25 MG tablet Take 1 tablet (25 mg total) by mouth daily. 09/19/16 01/29/18  Alisa Graff, FNP  Syringe, Disposable, 1 ML MISC 0.1 mLs by Does not apply route as directed. 08/21/15    Alisa Graff, FNP  tiotropium (SPIRIVA) 18 MCG inhalation capsule Place 18 mcg into inhaler and inhale daily.    [provider]  torsemide (DEMADEX) 20 MG tablet Take 20 mg by mouth daily.     [provider]      PHYSICAL EXAMINATION:   VITAL SIGNS: Blood pressure (!) 172/100, pulse 70, temperature 98.7 F (37.1 C), temperature source Oral, resp. rate 13, height _0  (1.676 m), weight (!) 149.7 kg (330 lb), SpO2 100 %.  GENERAL:  41 y.o.-year-old patient lying in the bed with no acute distress.  EYES: Pupils equal, round, reactive to light and accommodation. No scleral icterus. Extraocular muscles intact.  HEENT: Head atraumatic, normocephalic. Oropharynx and nasopharynx clear.  NECK:  Supple, no jugular venous distention. No thyroid enlargement, no tenderness.  LUNGS: Normal breath sounds bilaterally, no wheezing, rales,rhonchi or crepitation. No use of accessory muscles of respiration.  CARDIOVASCULAR: S1, S2 normal. No murmurs, rubs, or gallops.  ABDOMEN: Soft, nontender, nondistended. Bowel sounds present. No organomegaly or mass.  EXTREMITIES: No pedal edema, cyanosis, or clubbing.  NEUROLOGIC: Cranial nerves II through XII are intact. Muscle strength 5/5 in all extremities. Sensation intact. Gait not checked.  PSYCHIATRIC: The patient is alert and oriented x 3.  SKIN: No obvious rash, lesion, or ulcer.   LABORATORY PANEL:   CBC  Recent Labs Lab 05/18/17 2207  WBC 8.2  HGB 14.5  HCT 40.1  PLT 193  MCV 92.6  MCH 33.4  MCHC 36.1*  RDW 12.6   ------------------------------------------------------------------------------------------------------------------  Chemistries   Recent Labs Lab 05/18/17 2207 05/19/17 0117  NA 134*  --   K 4.1  --   CL 100*  --   CO2 25  --   GLUCOSE 208*  --   BUN 16  --   CREATININE 0.93  --   CALCIUM 9.2  --   AST  --  26  ALT  --  24  ALKPHOS  --  196*  BILITOT  --  0.5    ------------------------------------------------------------------------------------------------------------------ estimated creatinine clearance is 145.2 mL/min (by C-G formula based on SCr of 0.93 mg/dL). ------------------------------------------------------------------------------------------------------------------ No results for input(s): TSH, T4TOTAL, T3FREE, THYROIDAB in the last 72 hours.  Invalid input(s): FREET3   Coagulation profile No results for input(s): INR, PROTIME in the last 168 hours. ------------------------------------------------------------------------------------------------------------------- No results for input(s): DDIMER in the last 72 hours. -------------------------------------------------------------------------------------------------------------------  Cardiac Enzymes  Recent Labs Lab 05/18/17 2207 05/19/17 0117  TROPONINI 0.09* 0.09*   ------------------------------------------------------------------------------------------------------------------ Invalid input(s): POCBNP  ---------------------------------------------------------------------------------------------------------------  Urinalysis    Component Value Date/Time   COLORURINE YELLOW (A) 09/12/2016 0109   APPEARANCEUR CLEAR (A) 09/12/2016 0109   APPEARANCEUR Hazy 08/21/2014 0800   LABSPEC 1.025 09/12/2016 0109   LABSPEC 1.015 08/21/2014 0800   PHURINE 5.0 09/12/2016 0109   GLUCOSEU >=500 (A) 09/12/2016 0109   GLUCOSEU Negative 08/21/2014 0800   HGBUR SMALL (A) 09/12/2016 0109   BILIRUBINUR NEGATIVE 09/12/2016 0109   BILIRUBINUR Negative 08/21/2014 0800   KETONESUR NEGATIVE 09/12/2016 0109   PROTEINUR >=300 (A) 09/12/2016 0109   NITRITE NEGATIVE 09/12/2016 0109   LEUKOCYTESUR TRACE (A) 09/12/2016 0109   LEUKOCYTESUR 1+ 08/21/2014 0800     RADIOLOGY: Dg Chest 2 View  Result Date: 05/18/2017 CLINICAL DATA:  Chest pain EXAM: CHEST  2 VIEW COMPARISON:  03/01/2017  FINDINGS: Mild cardiomegaly with central vascular congestion. No focal pneumonia, collapse or consolidation. Negative for edema, effusion or pneumothorax. Trachea is midline. Left subclavian 2 lead pacer noted. IMPRESSION: Cardiomegaly with vascular congestion Electronically Signed   By: Jerilynn Mages.  Shick M.D.   On: 05/18/2017 22:39   Dg Foot Complete Right  Result Date: 05/18/2017 CLINICAL DATA:  Right foot pain.  No known injury. EXAM: RIGHT FOOT COMPLETE - 3+ VIEW COMPARISON:  Right foot radiograph 08/14/2015 FINDINGS: No fracture or dislocation of the right foot. Bone density is normal. Small plantar and posterior calcaneal enthesophytes. Normal joint spaces. IMPRESSION: No acute abnormality of the right foot. Electronically Signed   By: Ulyses Jarred M.D.   On: 05/18/2017 23:55   US Abdomen Limited Ruq  Result Date: 05/19/2017 CLINICAL DATA:  Abdominal pain history of pancreatitis EXAM: ULTRASOUND ABDOMEN LIMITED  RIGHT UPPER QUADRANT COMPARISON:  01/29/2017 FINDINGS: Gallbladder: No gallstones or wall thickening visualized. No sonographic Murphy sign noted by sonographer. Common bile duct: Diameter: 5.1 mm Liver: Slight increased echogenicity suggesting fatty infiltration. Portal vein is patent on color Doppler imaging with normal direction of blood flow towards the liver. IMPRESSION: Negative for gallstones or biliary dilatation. Slight increased echogenicity of the liver suggests fatty infiltration Electronically Signed   By: Donavan Foil M.D.   On: 05/19/2017 01:21    EKG: Orders placed or performed during the hospital encounter of 05/18/17  . EKG 12-Lead  . EKG 12-Lead  . ED EKG within 10 minutes  . ED EKG within 10 minutes    IMPRESSION AND PLAN: 41 year old male patient with history of cardiomyopathy, congestive heart failure, coronary artery disease, diabetes mellitus, hypertension, hyperlipidemia presented to the emergency room with abdominal discomfort.  Admitting diagnosis 1. Acute  pancreatitis 2. Abdominal pain 3. Atypical chest pain 4. Cardiomyopathy 5. Congestive heart failure 6. Coronary artery disease Treatment plan Admit patient to telemetry IV fluid hydration Keep patient nothing by mouth Cycle troponin Follow-up lipase level Pain management with IV morphine  All the records are reviewed and case discussed with ED provider. Management plans discussed with the patient, family and they are in agreement.  CODE STATUS:FULL CODE Code Status History    Date Active Date Inactive Code Status Order ID Comments User Context   09/12/2016  7:14 AM 09/13/2016  6:23 PM Full Code [id number]  Harrie Foreman, MD Inpatient   07/06/2016  5:35 PM 07/07/2016  5:49 PM Full Code [id number]  Dustin Flock, MD Inpatient   11/22/2015  7:53 AM 11/23/2015 12:12 PM Full Code [id number]  Harrie Foreman, MD Inpatient   07/09/2015 12:28 PM 07/10/2015  7:34 PM Full Code [id number]  Fritzi Mandes, MD Inpatient   03/30/2015  4:57 PM 03/31/2015  8:18 PM Full Code [id number]  Marzetta Board, MD Inpatient   12/11/2014  5:20 PM 12/13/2014  4:18 PM Full Code [id number]  Lorretta Harp, MD Inpatient   12/09/2014 11:15 PM 12/11/2014  5:20 PM Full Code [id number]  Jules Husbands, MD Inpatient       TOTAL TIME TAKING CARE OF THIS PATIENT: 50 minutes.    Saundra Shelling M.D on 05/19/2017 at 2:04 AM  Between 7am to 6pm - Pager - [phone]  After 6pm go to [email or web address] - password [email or web address] [place] [facility]  Office  [phone]  CC: Primary care physician; [facility] [place]

## 2017-05-19 NOTE — Progress Notes (Signed)
Initial Nutrition Assessment  DOCUMENTATION CODES:   Obesity unspecified  INTERVENTION:   Monitor for diet advancement  NUTRITION DIAGNOSIS:   Inadequate oral intake related to acute illness as evidenced by per patient/family report, percent weight loss.  GOAL:   Patient will meet greater than or equal to 90% of their needs  MONITOR:   PO intake, I & O's, Labs, Weight trends, Diet advancement, Supplement acceptance  REASON FOR ASSESSMENT:   Malnutrition Screening Tool    ASSESSMENT:   Oscar [name] has a PMH of CHF, DM, GERD, HLD, HTN, MI, recurrent idiopathic pancreatitis, presents with sharp epigastric pain, acute pancreatitis.  Spoke with Oscar [name] at bedside. He admits to pain today, but much less than upon admission "just some soreness." Denies any nausea/vomiting.  For the last 2 days he has eaten an apple, banana or some sort of fruit for breakfast, a sandwich for lunch, and nothing for dinner.  Normal PO intake consists of an apple, banana or fruit for breakfast, a sandwich for lunch, and a pizza or [employer] for dinner. States his usual weight is around 334 pounds with a 5-6 pound(1.7%) moderate weight loss over the past week.  No other complaints at this time. Still awaiting diet advancement pending labs Discussed with RN.   Labs reviewed:  Lipase 505  Medications reviewed and include:  Solumedrol NS at 46mL/hr  Nutrition-Focused physical exam completed. Findings are no fat depletion, no muscle depletion, and no edema.   Diet Order:  Diet NPO time specified  Skin:  Reviewed, no issues  Last BM:  PTA  Height:   Ht Readings from Last 1 Encounters:  05/18/17 5\' 6"  (1.676 m)    Weight:   Wt Readings from Last 1 Encounters:  05/19/17 (!) 329 lb 3.2 oz (149.3 kg)    Ideal Body Weight:  64.54 kg  BMI:  Body mass index is 53.13 kg/m.  Estimated Nutritional Needs:   Kcal:  2100-2300  Protein:  120-140 grams  Fluid:   2.1-2.3L  EDUCATION NEEDS:   No education needs identified at this time  Oscar [name]. Vianne Grieshop, MS, RD LDN Inpatient Clinical Dietitian Pager [phone]

## 2017-05-20 LAB — HIV ANTIBODY (ROUTINE TESTING W REFLEX): HIV SCREEN 4TH GENERATION: NONREACTIVE

## 2017-05-20 LAB — HEPATIC FUNCTION PANEL
ALK PHOS: 189 U/L — AB (ref 38–126)
ALT: 26 U/L (ref 17–63)
AST: 29 U/L (ref 15–41)
Albumin: 3.6 g/dL (ref 3.5–5.0)
BILIRUBIN INDIRECT: 0.4 mg/dL (ref 0.3–0.9)
BILIRUBIN TOTAL: 0.6 mg/dL (ref 0.3–1.2)
Bilirubin, Direct: 0.2 mg/dL (ref 0.1–0.5)
TOTAL PROTEIN: 8 g/dL (ref 6.5–8.1)

## 2017-05-20 LAB — GAMMA GT: GGT: 415 U/L — AB (ref 7–50)

## 2017-05-20 LAB — GLUCOSE, CAPILLARY
GLUCOSE-CAPILLARY: 296 mg/dL — AB (ref 65–99)
GLUCOSE-CAPILLARY: 354 mg/dL — AB (ref 65–99)
Glucose-Capillary: 300 mg/dL — ABNORMAL HIGH (ref 65–99)

## 2017-05-20 MED ORDER — PREDNISONE 10 MG PO TABS: 10.0000 mg | ORAL_TABLET | Freq: Every day | ORAL | 0 refills | Status: DC

## 2017-05-20 MED ORDER — OXYCODONE-ACETAMINOPHEN 5-325 MG PO TABS: 2.0000 | ORAL_TABLET | Freq: Four times a day (QID) | ORAL | 0 refills | Status: DC | PRN

## 2017-05-20 MED ORDER — INSULIN GLARGINE 100 UNIT/ML ~~LOC~~ SOLN: 30.0000 [IU] | Freq: Every day | SUBCUTANEOUS | 11 refills | Status: DC

## 2017-05-20 NOTE — Progress Notes (Signed)
Inpatient Diabetes Program Recommendations  AACE/ADA: New Consensus Statement on Inpatient Glycemic Control (2015)  Target Ranges:  Prepandial:   less than 140 mg/dL      Peak postprandial:   less than 180 mg/dL (1-2 hours)      Critically ill patients:  140 - 180 mg/dL   Lab Results  Component Value Date   GLUCAP 354 (H) 05/20/2017   HGBA1C 10.9 (H) 09/12/2016    Review of Glycemic ControlResults for JODEY, BURBANO (MRN [id number]) as of 05/20/2017 08:53  Ref. Range 05/19/2017 18:00 05/20/2017 01:12 05/20/2017 05:15 05/20/2017 07:26  Glucose-Capillary Latest Ref Range: 65 - 99 mg/dL 448 (H) 296 (H) 300 (H) 354 (H)   Diabetes history: Type 2 diabetes Outpatient Diabetes medications: Lantus 56 units QHS, Metformin 1000 mg BID Current orders for Inpatient glycemic control:  Lantus 12 units q HS, Novolog moderate q 6 hours  Inpatient Diabetes Program Recommendations:  Please increase Lantus to 30 units q HS and increase frequency of Novolog correction to q 4 hours.   Thanks, Adah Perl, RN, BC-ADM Inpatient Diabetes Coordinator Pager [phone] (8a-5p)

## 2017-05-20 NOTE — Discharge Summary (Signed)
Oscar [name], is a 41 y.o. male  DOB [birth year]  MRN [id number].  Admission date:  05/18/2017  Admitting Physician  Saundra Shelling, MD  Discharge Date:  05/20/2017   Primary MD  [facility], [place]  Recommendations for primary care physician for things to follow:  Follow-up with PCP in one week Follow up with [place] 2 WEEKS   Admission Diagnosis  Elevated troponin [R74.8] Abdominal pain [R10.9] Chest pain, unspecified type [R07.9] Acute pancreatitis, unspecified complication status, unspecified pancreatitis type [K85.90]   Discharge Diagnosis  Elevated troponin [R74.8] Abdominal pain [R10.9] Chest pain, unspecified type [R07.9] Acute pancreatitis, unspecified complication status, unspecified pancreatitis type [K85.90]    Active Problems:   Acute pancreatitis      Past Medical History:  Diagnosis Date  . Asthma   . Cardiomyopathy ([place])   . CHF (congestive heart failure) ([place])   . Coronary artery disease   . Deafness in right ear   . Diabetes mellitus without complication ([place])   . Dysrhythmia    svt  . 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 GERD (gastroesophageal reflux disease)   . Hyperlipidemia   . Hypertension   . Hypoxemia   . Myocardial infarction ([place])    0881,1031,5/94  . Pancreatitis   . Sleep apnea    osa    Past Surgical History:  Procedure Laterality Date  . CARDIAC CATHETERIZATION  12/11/2014   Procedure: RIGHT/LEFT HEART CATH AND CORONARY ANGIOGRAPHY;  Surgeon: Lorretta Harp, MD;  Location: [facility] CATH LAB;  Service: Cardiovascular;;  . ICD LEAD REMOVAL N/A 03/30/2015   Procedure: ICD LEAD REMOVAL;  Surgeon: Marzetta Board, MD;  Location: [facility] ORS;  Service: Cardiovascular;  Laterality: N/A;  . IMPLANTABLE CARDIOVERTER DEFIBRILLATOR IMPLANT    .  INSERT / REPLACE / REMOVE PACEMAKER    . LEFT HEART CATHETERIZATION WITH CORONARY ANGIOGRAM N/A 12/09/2014   Procedure: LEFT HEART CATHETERIZATION WITH CORONARY ANGIOGRAM;  Surgeon: Burnell Blanks, MD;  Location: [facility] CATH LAB;  Service: Cardiovascular;  Laterality: N/A;       History of present illness and  Hospital Course:     Kindly see H&P for history of present illness and admission details, please review complete Labs, Consult reports and Test reports for all details in brief  HPI  from the history and physical done on the day of admission 41 year old male patient admitted for abdominal pain, found to have acute pancreatitis.   Hospital Course  1 acute abdominal pain secondary to acute pancreatitis: Has been having recurrent episodes. Normal LFTs. Seen by gastroenterology, patient needs to see them as an outpatient for further workup including pancreatic mass/pancreas divisum. Lipase initially 500 per decreased to 170 with conservative treatment with pain medicines, nothing by mouth status. Started on the diet, patient abdominal pain resolved and tolerating the diet. Ultrasound abdomen no gallstones. Found to have isolated alkaline phosphatase elevation up to 196 without any bilirubin elevation. Denies any alcohol abuse. And no family history of pancreatitis. Follow up with GI Clinic at [facility].  Acute gout of the right foot: No history of gout but IV steroids helped him, discharged home with tapering course of steroids, pain medicines, except the right foot did not show any fractures. Needs follow-up with primary doctor regarding confirmation of diagnosis and further treatment.   #2 diabetes mellitus type 2: Dose is increased to 30 units at bedtime, dose is increased secondary to hyperglycemia due to  steroids, continue metformin.     #3 history of chronic systolic heart failure: No shortness of breath this time, no CHF attack. Continue beta blockers, and  Crestor, Aldactone #4 morbid obesity.    Discharge Condition:    Follow UP      Discharge Instructions  and  Discharge Medications      Allergies as of 05/20/2017      Reactions   Bidil [isosorb Dinitrate-hydralazine] Other (See Comments)   Migraine Headache Migraine Headache      Medication List    STOP taking these medications   ondansetron 4 MG tablet Commonly known as:  ZOFRAN     TAKE these medications   aspirin EC 325 MG tablet Take 1 tablet (325 mg total) by mouth daily. What changed:  how much to take   atorvastatin 40 MG tablet Commonly known as:  LIPITOR Take 1 tablet (40 mg total) by mouth daily at 6 PM.   insulin glargine 100 UNIT/ML injection Commonly known as:  LANTUS Inject 0.3 mLs (30 Units total) into the skin at bedtime. What changed:  how much to take   insulin starter kit- syringes Misc 1 kit by Other route once.   metFORMIN 1000 MG tablet Commonly known as:  GLUCOPHAGE Take 1,000 mg by mouth 2 (two) times daily with a meal.   metoprolol succinate 25 MG 24 hr tablet Commonly known as:  TOPROL XL Take 1 tablet (25 mg total) by mouth daily.   omeprazole 20 MG capsule Commonly known as:  PRILOSEC Take 1 capsule (20 mg total) by mouth daily.   ondansetron 8 MG disintegrating tablet Commonly known as:  ZOFRAN ODT Take 1 tablet (8 mg total) by mouth every 8 (eight) hours as needed for nausea or vomiting.   oxyCODONE-acetaminophen 5-325 MG tablet Commonly known as:  ROXICET Take 2 tablets by mouth every 6 (six) hours as needed. What changed:  how much to take   polyethylene glycol packet Commonly known as:  MIRALAX / GLYCOLAX Take 17 g by mouth daily.   predniSONE 10 MG tablet Commonly known as:  DELTASONE Take 1 tablet (10 mg total) by mouth daily.   sacubitril-valsartan 49-51 MG Commonly known as:  ENTRESTO Take 1 tablet by mouth 2 (two) times daily.   spironolactone 25 MG tablet Commonly known as:  ALDACTONE Take 1  tablet (25 mg total) by mouth daily.   Syringe (Disposable) 1 ML Misc 0.1 mLs by Does not apply route as directed.   tiotropium 18 MCG inhalation capsule Commonly known as:  SPIRIVA Place 18 mcg into inhaler and inhale daily.   torsemide 20 MG tablet Commonly known as:  DEMADEX Take 20 mg by mouth daily.            Discharge Care Instructions        Start     Ordered   05/20/17 0000  insulin glargine (LANTUS) 100 UNIT/ML injection  Daily at bedtime     05/20/17 1016   05/20/17 0000  oxyCODONE-acetaminophen (ROXICET) 5-325 MG tablet  Every 6 hours PRN     05/20/17 1016   05/20/17 0000  predniSONE (DELTASONE) 10 MG tablet  Daily    Comments:  Prednisone 50 mg daily for 2 days 40 mg by mouth daily for 2 days 30 mg daily for 2 days 20 mg daily for 2 days 10 mg daily for 2 days   05/20/17 1016        Diet and Activity recommendation: See Discharge Instructions  above   Consults obtained - GI   Major procedures and Radiology Reports - PLEASE review detailed and final reports for all details, in brief -     Dg Chest 2 View  Result Date: 05/18/2017 CLINICAL DATA:  Chest pain EXAM: CHEST  2 VIEW COMPARISON:  03/01/2017 FINDINGS: Mild cardiomegaly with central vascular congestion. No focal pneumonia, collapse or consolidation. Negative for edema, effusion or pneumothorax. Trachea is midline. Left subclavian 2 lead pacer noted. IMPRESSION: Cardiomegaly with vascular congestion Electronically Signed   By: Jerilynn Mages.  Shick M.D.   On: 05/18/2017 22:39   Dg Foot Complete Right  Result Date: 05/18/2017 CLINICAL DATA:  Right foot pain.  No known injury. EXAM: RIGHT FOOT COMPLETE - 3+ VIEW COMPARISON:  Right foot radiograph 08/14/2015 FINDINGS: No fracture or dislocation of the right foot. Bone density is normal. Small plantar and posterior calcaneal enthesophytes. Normal joint spaces. IMPRESSION: No acute abnormality of the right foot. Electronically Signed   By: Ulyses Jarred M.D.    On: 05/18/2017 23:55   US Abdomen Limited Ruq  Result Date: 05/19/2017 CLINICAL DATA:  Abdominal pain history of pancreatitis EXAM: ULTRASOUND ABDOMEN LIMITED RIGHT UPPER QUADRANT COMPARISON:  01/29/2017 FINDINGS: Gallbladder: No gallstones or wall thickening visualized. No sonographic Murphy sign noted by sonographer. Common bile duct: Diameter: 5.1 mm Liver: Slight increased echogenicity suggesting fatty infiltration. Portal vein is patent on color Doppler imaging with normal direction of blood flow towards the liver. IMPRESSION: Negative for gallstones or biliary dilatation. Slight increased echogenicity of the liver suggests fatty infiltration Electronically Signed   By: Donavan Foil M.D.   On: 05/19/2017 01:21    Micro Results     No results found for this or any previous visit (from the past 240 hour(s)).     Today   Subjective:   Oscar [name] today has no headache,no chest abdominal pain,no new weakness tingling or numbness, feels much better wants to go home today.   Objective:   Blood pressure (!) 144/88, pulse 67, temperature 97.7 F (36.5 C), temperature source Oral, resp. rate 18, height 5' 6"  (1.676 m), weight (!) 149.3 kg (329 lb 3.2 oz), SpO2 98 %.   Intake/Output Summary (Last 24 hours) at 05/20/17 1303 Last data filed at 05/20/17 1022  Gross per 24 hour  Intake              840 ml  Output             1300 ml  Net             -460 ml    Exam Awake Alert, Oriented x 3, No new F.N deficits, Normal affect [place].AT,PERRAL Supple Neck,No JVD, No cervical lymphadenopathy appriciated.  Symmetrical Chest wall movement, Good air movement bilaterally, CTAB RRR,No Gallops,Rubs or new Murmurs, No Parasternal Heave +ve B.Sounds, Abd Soft, Non tender, No organomegaly appriciated, No rebound -guarding or rigidity. No Cyanosis, Clubbing or edema, No new Rash or bruise  Data Review   CBC w Diff: Lab Results  Component Value Date   WBC 7.3 05/19/2017   HGB 14.0  05/19/2017   HGB 14.3 08/22/2014   HCT 38.7 (L) 05/19/2017   HCT 43.1 08/22/2014   PLT 175 05/19/2017   PLT 179 08/22/2014   LYMPHOPCT 22 03/01/2017   LYMPHOPCT 51.0 08/22/2014   MONOPCT 8 03/01/2017   MONOPCT 6.3 08/22/2014   EOSPCT 2 03/01/2017   EOSPCT 3.0 08/22/2014   BASOPCT 1 03/01/2017   BASOPCT 0.2 08/22/2014  CMP: Lab Results  Component Value Date   NA 136 05/19/2017   NA 140 10/18/2015   NA 135 (L) 08/22/2014   K 4.1 05/19/2017   K 4.1 08/22/2014   CL 100 (L) 05/19/2017   CL 103 08/22/2014   CO2 28 05/19/2017   CO2 27 08/22/2014   BUN 16 05/19/2017   BUN 11 10/18/2015   BUN 21 (H) 08/22/2014   CREATININE 0.98 05/19/2017   CREATININE 1.08 08/22/2014   GLU 126 10/18/2015   PROT 8.0 05/20/2017   PROT 7.5 08/22/2014   ALBUMIN 3.6 05/20/2017   ALBUMIN 2.9 (L) 08/22/2014   BILITOT 0.6 05/20/2017   BILITOT 0.4 08/22/2014   ALKPHOS 189 (H) 05/20/2017   ALKPHOS 209 (H) 08/22/2014   AST 29 05/20/2017   AST 68 (H) 08/22/2014   ALT 26 05/20/2017   ALT 100 (H) 08/22/2014  .   Total Time in preparing paper work, data evaluation and todays exam - 4 minutes  Crystall Donaldson M.D on 05/20/2017 at 1:03 PM    Note: This dictation was prepared with Dragon dictation along with smaller phrase technology. Any transcriptional errors that result from this process are unintentional.

## 2017-05-20 NOTE — Progress Notes (Signed)
Oscar [name] to be D/C'd Home per MD order.  Discussed prescriptions and follow up appointments with the patient. Prescriptions given to patient, medication list explained in detail. Pt verbalized understanding.  Allergies as of 05/20/2017      Reactions   Bidil [isosorb Dinitrate-hydralazine] Other (See Comments)   Migraine Headache Migraine Headache      Medication List    STOP taking these medications   ondansetron 4 MG tablet Commonly known as:  ZOFRAN     TAKE these medications   aspirin EC 325 MG tablet Take 1 tablet (325 mg total) by mouth daily. What changed:  how much to take   atorvastatin 40 MG tablet Commonly known as:  LIPITOR Take 1 tablet (40 mg total) by mouth daily at 6 PM.   insulin glargine 100 UNIT/ML injection Commonly known as:  LANTUS Inject 0.3 mLs (30 Units total) into the skin at bedtime. What changed:  how much to take   insulin starter kit- syringes Misc 1 kit by Other route once.   metFORMIN 1000 MG tablet Commonly known as:  GLUCOPHAGE Take 1,000 mg by mouth 2 (two) times daily with a meal.   metoprolol succinate 25 MG 24 hr tablet Commonly known as:  TOPROL XL Take 1 tablet (25 mg total) by mouth daily.   omeprazole 20 MG capsule Commonly known as:  PRILOSEC Take 1 capsule (20 mg total) by mouth daily.   ondansetron 8 MG disintegrating tablet Commonly known as:  ZOFRAN ODT Take 1 tablet (8 mg total) by mouth every 8 (eight) hours as needed for nausea or vomiting.   oxyCODONE-acetaminophen 5-325 MG tablet Commonly known as:  ROXICET Take 2 tablets by mouth every 6 (six) hours as needed. What changed:  how much to take   polyethylene glycol packet Commonly known as:  MIRALAX / GLYCOLAX Take 17 g by mouth daily.   predniSONE 10 MG tablet Commonly known as:  DELTASONE Take 1 tablet (10 mg total) by mouth daily.   sacubitril-valsartan 49-51 MG Commonly known as:  ENTRESTO Take 1 tablet by mouth 2 (two) times daily.    spironolactone 25 MG tablet Commonly known as:  ALDACTONE Take 1 tablet (25 mg total) by mouth daily.   Syringe (Disposable) 1 ML Misc 0.1 mLs by Does not apply route as directed.   tiotropium 18 MCG inhalation capsule Commonly known as:  SPIRIVA Place 18 mcg into inhaler and inhale daily.   torsemide 20 MG tablet Commonly known as:  DEMADEX Take 20 mg by mouth daily.            Discharge Care Instructions        Start     Ordered   05/20/17 0000  insulin glargine (LANTUS) 100 UNIT/ML injection  Daily at bedtime     05/20/17 1016   05/20/17 0000  oxyCODONE-acetaminophen (ROXICET) 5-325 MG tablet  Every 6 hours PRN     05/20/17 1016   05/20/17 0000  predniSONE (DELTASONE) 10 MG tablet  Daily    Comments:  Prednisone 50 mg daily for 2 days 40 mg by mouth daily for 2 days 30 mg daily for 2 days 20 mg daily for 2 days 10 mg daily for 2 days   05/20/17 1016      Vitals:   05/20/17 0808 05/20/17 1019  BP: 105/60 (!) 144/88  Pulse: (!) 59 67  Resp: 18   Temp: 97.7 F (36.5 C)   SpO2: 98%     Skin  clean, dry and intact without evidence of skin break down, no evidence of skin tears noted.  Tele box removed and returned.IV catheter discontinued intact. Site without signs and symptoms of complications. Dressing and pressure applied. Pt denies pain at this time. No complaints noted.  An After Visit Summary was printed and given to the patient. Patient escorted via [place], and D/C home via private auto.  Rolley Sims

## 2017-05-21 LAB — IGG 4: IgG, Subclass 4: 17 mg/dL (ref 2–96)

## 2017-06-08 ENCOUNTER — Ambulatory Visit: Payer: Medicaid Other | Admitting: Podiatry

## 2017-06-17 ENCOUNTER — Other Ambulatory Visit: Payer: Self-pay | Admitting: Family

## 2017-06-22 ENCOUNTER — Ambulatory Visit (INDEPENDENT_AMBULATORY_CARE_PROVIDER_SITE_OTHER): Payer: Medicaid Other | Admitting: Podiatry

## 2017-06-22 DIAGNOSIS — E119 Type 2 diabetes mellitus without complications: Secondary | ICD-10-CM

## 2017-06-22 DIAGNOSIS — M79676 Pain in unspecified toe(s): Secondary | ICD-10-CM

## 2017-06-22 DIAGNOSIS — B351 Tinea unguium: Secondary | ICD-10-CM

## 2017-06-22 NOTE — Progress Notes (Signed)
Patient presented to the office for nail care and was marked no show.  Therefore he was never brought to a treatment room.  He was rescheduled for 3 days.   [name] DPM

## 2017-06-25 ENCOUNTER — Ambulatory Visit (INDEPENDENT_AMBULATORY_CARE_PROVIDER_SITE_OTHER): Payer: Medicaid Other | Admitting: Podiatry

## 2017-06-25 ENCOUNTER — Encounter: Payer: Self-pay | Admitting: Podiatry

## 2017-06-25 DIAGNOSIS — M79676 Pain in unspecified toe(s): Secondary | ICD-10-CM

## 2017-06-25 DIAGNOSIS — B351 Tinea unguium: Secondary | ICD-10-CM

## 2017-06-25 DIAGNOSIS — E119 Type 2 diabetes mellitus without complications: Secondary | ICD-10-CM

## 2017-06-25 NOTE — Progress Notes (Signed)
Complaint:  Visit Type: Patient returns to my office for continued preventative foot care services. Complaint: Patient states" my nails have grown long and thick and become painful to walk and wear shoes" Patient has been diagnosed with DM with no foot complications. The patient presents for preventative foot care services. No changes to ROS  Podiatric Exam: Vascular: dorsalis pedis and posterior tibial pulses are palpable bilateral. Capillary return is immediate. Temperature gradient is WNL. Skin turgor WNL  Sensorium: Normal Semmes Weinstein monofilament test. Normal tactile sensation bilaterally. Nail Exam: Pt has thick disfigured discolored nails with subungual debris noted bilateral entire nail hallux through fifth toenails Ulcer Exam: There is no evidence of ulcer or pre-ulcerative changes or infection. Orthopedic Exam: Muscle tone and strength are WNL. No limitations in general ROM. No crepitus or effusions noted. Foot type and digits show no abnormalities. Bony prominences are unremarkable. Skin: No Porokeratosis. No infection or ulcers  Diagnosis:  Onychomycosis, , Pain in right toe, pain in left toes  Treatment & Plan Procedures and Treatment: Consent by patient was obtained for treatment procedures. The patient understood the discussion of treatment and procedures well. All questions were answered thoroughly reviewed. Debridement of mycotic and hypertrophic toenails, 1 through 5 bilateral and clearing of subungual debris. No ulceration, no infection noted. Medicaid  ABN signed for 2018. Return Visit-Office Procedure: Patient instructed to return to the office for a follow up visit 3 months for continued evaluation and treatment.    [name] DPM

## 2017-07-10 ENCOUNTER — Other Ambulatory Visit: Payer: Self-pay | Admitting: Family

## 2017-08-04 ENCOUNTER — Other Ambulatory Visit: Payer: Self-pay

## 2017-08-04 ENCOUNTER — Encounter: Payer: Self-pay | Admitting: Family

## 2017-08-04 ENCOUNTER — Ambulatory Visit: Payer: Medicaid Other | Attending: Family | Admitting: Family

## 2017-08-04 VITALS — BP 129/79 | HR 93 | Resp 18 | Ht 66.0 in | Wt 331.4 lb

## 2017-08-04 DIAGNOSIS — I471 Supraventricular tachycardia: Secondary | ICD-10-CM | POA: Diagnosis not present

## 2017-08-04 DIAGNOSIS — I429 Cardiomyopathy, unspecified: Secondary | ICD-10-CM | POA: Diagnosis not present

## 2017-08-04 DIAGNOSIS — Z794 Long term (current) use of insulin: Secondary | ICD-10-CM | POA: Diagnosis not present

## 2017-08-04 DIAGNOSIS — I10 Essential (primary) hypertension: Secondary | ICD-10-CM

## 2017-08-04 DIAGNOSIS — Z8249 Family history of ischemic heart disease and other diseases of the circulatory system: Secondary | ICD-10-CM | POA: Diagnosis not present

## 2017-08-04 DIAGNOSIS — F17210 Nicotine dependence, cigarettes, uncomplicated: Secondary | ICD-10-CM | POA: Diagnosis not present

## 2017-08-04 DIAGNOSIS — Z833 Family history of diabetes mellitus: Secondary | ICD-10-CM | POA: Diagnosis not present

## 2017-08-04 DIAGNOSIS — Z95810 Presence of automatic (implantable) cardiac defibrillator: Secondary | ICD-10-CM | POA: Insufficient documentation

## 2017-08-04 DIAGNOSIS — Z8379 Family history of other diseases of the digestive system: Secondary | ICD-10-CM | POA: Insufficient documentation

## 2017-08-04 DIAGNOSIS — G4733 Obstructive sleep apnea (adult) (pediatric): Secondary | ICD-10-CM | POA: Insufficient documentation

## 2017-08-04 DIAGNOSIS — H9191 Unspecified hearing loss, right ear: Secondary | ICD-10-CM | POA: Diagnosis not present

## 2017-08-04 DIAGNOSIS — E785 Hyperlipidemia, unspecified: Secondary | ICD-10-CM | POA: Diagnosis not present

## 2017-08-04 DIAGNOSIS — I2510 Atherosclerotic heart disease of native coronary artery without angina pectoris: Secondary | ICD-10-CM | POA: Insufficient documentation

## 2017-08-04 DIAGNOSIS — I110 Hypertensive heart disease with heart failure: Secondary | ICD-10-CM | POA: Diagnosis not present

## 2017-08-04 DIAGNOSIS — Z7982 Long term (current) use of aspirin: Secondary | ICD-10-CM | POA: Diagnosis not present

## 2017-08-04 DIAGNOSIS — E119 Type 2 diabetes mellitus without complications: Secondary | ICD-10-CM | POA: Diagnosis not present

## 2017-08-04 DIAGNOSIS — I252 Old myocardial infarction: Secondary | ICD-10-CM | POA: Insufficient documentation

## 2017-08-04 DIAGNOSIS — I5022 Chronic systolic (congestive) heart failure: Secondary | ICD-10-CM | POA: Insufficient documentation

## 2017-08-04 DIAGNOSIS — J45909 Unspecified asthma, uncomplicated: Secondary | ICD-10-CM | POA: Diagnosis not present

## 2017-08-04 DIAGNOSIS — R0602 Shortness of breath: Secondary | ICD-10-CM | POA: Insufficient documentation

## 2017-08-04 DIAGNOSIS — K219 Gastro-esophageal reflux disease without esophagitis: Secondary | ICD-10-CM | POA: Insufficient documentation

## 2017-08-04 DIAGNOSIS — Z79899 Other long term (current) drug therapy: Secondary | ICD-10-CM | POA: Diagnosis not present

## 2017-08-04 DIAGNOSIS — Z720 Tobacco use: Secondary | ICD-10-CM

## 2017-08-04 DIAGNOSIS — Z825 Family history of asthma and other chronic lower respiratory diseases: Secondary | ICD-10-CM | POA: Insufficient documentation

## 2017-08-04 DIAGNOSIS — K861 Other chronic pancreatitis: Secondary | ICD-10-CM

## 2017-08-04 NOTE — Progress Notes (Signed)
Patient ID: Oscar [name], male    DOB: Nov 07, 1975, 41 y.o.   MRN: [id number]  HPI  Mr Wadhwa is a 41 y/o male with a history of obstructive sleep apnea (currently with CPAP), MI, HTN, hyperlipidemia, GERD, DM, SVT, asthma and chronic heart failure.  Last echo was done 02/06/15 which showed an EF of 25% which is unchanged from previous echo on 12/10/14 which showed an EF of 25% and mild MR.   Admitted 05/18/17 due to acute pancreatitis. Lipase initially elevated at 500 and then decreased to 170 with conservative treatment. Restarted on his regular diet. Abdominal ultrasound was negative for gallstones. GI consult obtained and he was discharged home after 2 days. Had previously been in the ED 3 times with the most recent being 03/23/17. Twice for pancreatitis and once for parotitis.   He presents today for a follow-up visit with a chief complaint of mild shortness of breath upon moderate exertion. He describes this as chronic in nature having been present for several years with varying levels of severity. He has associated fatigue, wheezing, abdominal pain and weight gain associated with this. He denies any chest pain, edema, palpitations, dizziness or difficulty sleeping. He says that his abdominal pain has been worsening and he's getting frustrated with these recurrent bouts of pancreatitis that he's having. He feels like this may have coincided with the start of insulin.   Past Medical History:  Diagnosis Date  . Asthma   . Cardiomyopathy ([place])   . CHF (congestive heart failure) ([place])   . Coronary artery disease   . Deafness in right ear   . Diabetes mellitus without complication ([place])   . Dysrhythmia    svt  . 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 GERD (gastroesophageal reflux disease)   . Hyperlipidemia   . Hypertension   . Hypoxemia   . Myocardial infarction ([place])    [id number],6/57  . Pancreatitis   . Sleep apnea    osa   Past Surgical History:  Procedure Laterality  Date  . CARDIAC CATHETERIZATION  12/11/2014   Procedure: RIGHT/LEFT HEART CATH AND CORONARY ANGIOGRAPHY;  Surgeon: Lorretta Harp, MD;  Location: [facility] CATH LAB;  Service: Cardiovascular;;  . ICD LEAD REMOVAL N/A 03/30/2015   Procedure: ICD LEAD REMOVAL;  Surgeon: Marzetta Board, MD;  Location: [facility] ORS;  Service: Cardiovascular;  Laterality: N/A;  . IMPLANTABLE CARDIOVERTER DEFIBRILLATOR IMPLANT    . INSERT / REPLACE / REMOVE PACEMAKER    . LEFT HEART CATHETERIZATION WITH CORONARY ANGIOGRAM N/A 12/09/2014   Procedure: LEFT HEART CATHETERIZATION WITH CORONARY ANGIOGRAM;  Surgeon: Burnell Blanks, MD;  Location: [facility] CATH LAB;  Service: Cardiovascular;  Laterality: N/A;   Family History  Problem Relation Age of Onset  . Hypertension Mother   . Congestive Heart Failure Mother   . Hypertension Sister   . Diabetes Sister   . Pancreatitis Sister   . COPD Sister   . Pancreatitis Brother   . Anemia Neg Hx   . Arrhythmia Neg Hx   . Asthma Neg Hx   . Clotting disorder Neg Hx   . Fainting Neg Hx   . Heart attack Neg Hx   . Heart disease Neg Hx   . Heart failure Neg Hx   . Hyperlipidemia Neg Hx    Social History   Tobacco Use  . Smoking status: Current Every Day Smoker    Packs/day: 0.50    Years: 24.00    Pack  years: 12.00    Types: Cigarettes  . Smokeless tobacco: Never Used  . Tobacco comment: 4/5 Smoking 5 cigs a day  Substance Use Topics  . Alcohol use: No   Allergies  Allergen Reactions  . Bidil [Isosorb Dinitrate-Hydralazine] Other (See Comments)    Migraine Headache Migraine Headache   Prior to Admission medications   Medication Sig Start Date End Date Taking? Authorizing Provider  aspirin EC 325 MG tablet Take 1 tablet (325 mg total) by mouth daily. 07/10/15  Yes Aldean Jewett, MD  atorvastatin (LIPITOR) 40 MG tablet Take 1 tablet (40 mg total) by mouth daily at 6 PM. 09/13/16  Yes Gouru, Aruna, MD  ENTRESTO 49-51 MG TAKE 1 TABLET BY MOUTH TWO TIMES DAILY 06/17/17   Yes Darylene Price A, FNP  insulin glargine (LANTUS) 100 UNIT/ML injection Inject 0.3 mLs (30 Units total) into the skin at bedtime. 05/20/17  Yes Epifanio Lesches, MD  insulin starter kit- syringes MISC 1 kit by Other route once. 07/10/15  Yes Aldean Jewett, MD  metFORMIN (GLUCOPHAGE) 1000 MG tablet Take 1,000 mg by mouth 2 (two) times daily with a meal.   Yes [provider]  metoprolol succinate (TOPROL-XL) 25 MG 24 hr tablet TAKE ONE TABLET BY MOUTH DAILY 07/10/17  Yes Darylene Price A, FNP  omeprazole (PRILOSEC) 20 MG capsule Take 1 capsule (20 mg total) by mouth daily. 04/01/16  Yes McGowan, Larene [place] A, PA-C  oxyCODONE-acetaminophen (ROXICET) 5-325 MG tablet Take 2 tablets by mouth every 6 (six) hours as needed. 05/20/17 05/20/18 Yes Epifanio Lesches, MD  polyethylene glycol (MIRALAX / GLYCOLAX) packet Take 17 g by mouth daily.   Yes [provider]  predniSONE (DELTASONE) 10 MG tablet Take 1 tablet (10 mg total) by mouth daily. 05/20/17  Yes Epifanio Lesches, MD  spironolactone (ALDACTONE) 25 MG tablet Take 1 tablet (25 mg total) by mouth daily. 09/19/16 01/29/18 Yes Hackney, Otila Kluver A, FNP  Syringe, Disposable, 1 ML MISC 0.1 mLs by Does not apply route as directed. 08/21/15  Yes Darylene Price A, FNP  tiotropium (SPIRIVA) 18 MCG inhalation capsule Place 18 mcg into inhaler and inhale daily.   Yes [provider]  torsemide (DEMADEX) 20 MG tablet Take 20 mg by mouth daily.    Yes [provider]  ondansetron (ZOFRAN ODT) 8 MG disintegrating tablet Take 1 tablet (8 mg total) by mouth every 8 (eight) hours as needed for nausea or vomiting. Patient not taking: Reported on 05/19/2017 01/29/17   Darel Hong, MD    Review of Systems  Constitutional: Positive for fatigue. Negative for appetite change.  HENT: Negative for congestion, postnasal drip and sore throat.   Eyes: Negative.   Respiratory: Positive for shortness of breath and wheezing. Negative for  chest tightness.   Cardiovascular: Negative for chest pain, palpitations and leg swelling.  Gastrointestinal: Positive for abdominal pain. Negative for abdominal distention and diarrhea.  Endocrine: Negative.   Genitourinary: Negative.   Musculoskeletal: Negative for back pain.  Skin: Negative.   Allergic/Immunologic: Negative.   Neurological: Negative for dizziness and light-headedness.  Hematological: Negative for adenopathy. Does not bruise/bleed easily.  Psychiatric/Behavioral: Negative for dysphoric mood, sleep disturbance (sleeping on 4 pillows) and suicidal ideas. The patient is not nervous/anxious.    Vitals:   08/04/17 1121  BP: 129/79  Pulse: 93  Resp: 18  SpO2: 100%  Weight: (!) 331 lb 6 oz (150.3 kg)  Height: 5' 6"  (1.676 m)   Wt Readings from Last 3 Encounters:  08/04/17 (!) 331 lb 6 oz (150.3 kg)  05/19/17 (!) 329 lb 3.2 oz (149.3 kg)  05/01/17 (!) 328 lb 6 oz (148.9 kg)    Lab Results  Component Value Date   CREATININE 0.98 05/19/2017   CREATININE 0.93 05/18/2017   CREATININE 1.04 03/23/2017    Physical Exam  Constitutional: He is oriented to person, place, and time. He appears well-developed and well-nourished.  HENT:  Head: Normocephalic and atraumatic.  Neck: Normal range of motion. Neck supple. No JVD present.  Cardiovascular: Normal rate and regular rhythm.  Pulmonary/Chest: Effort normal. He has no wheezes. He has no rales.  Abdominal: Soft. He exhibits no distension. There is tenderness.  Musculoskeletal: He exhibits no edema or tenderness.  Neurological: He is alert and oriented to person, place, and time.  Skin: Skin is warm and dry.  Psychiatric: He has a normal mood and affect. His behavior is normal. Thought content normal.  Nursing note and vitals reviewed.    Assessment & Plan:  1: Chronic heart failure with reduced ejection fraction- - NYHA class II - euvolemic today - weight up 3 pounds since he was last here. Reminded to call for  an overnight weight gain of >2 pounds or a weekly weight gain of >5 pounds.  - tolerating toprol XL without known side effects; consider titrating up - echo not done; readdress this with patient - last saw cardiology [name]) 03/27/17 - bidil gave him migraine headache - [facility] comes and fills out his pill box for him - does not meet ReDS vest criteria due to BMI  2: HTN- - BP looks good today - BMP from 05/19/17 reviewed and shows sodium 136, potassium 4.1 & GFR >60  3: Obstructive sleep apnea- - wearing his CPAP and wears it for about 3 hours each night - continues wearing oxygen at 3L   4: Diabetes- - glucose at home was 335 fasting - lantus is being titrated up by PCP - encouraged him to follow closely with his PCP or ask about a referral to an endocrinologist  5: Tobacco use- - continues to smoke 4 cigarettes daily - does remove himself from the oxygen when smoking - complete cessation discussed for 3 minutes with him  6: Pancreatitis- - concerned about this recurrent problem - denies any alcohol use - has a follow-up appointment with GI providers - will have a pharmacist here review medications for possible contributors to pancreatitis  Patient did not bring his medications nor a list. Each medication was verbally reviewed with the patient and he was encouraged to bring the bottles to every visit to confirm accuracy of list.  Return in 3 months or sooner for any questions/problems before then.

## 2017-08-04 NOTE — Patient Instructions (Signed)
Continue weighing daily and call for an overnight weight gain of > 2 pounds or a weekly weight gain of >5 pounds. 

## 2017-08-05 ENCOUNTER — Encounter: Payer: Self-pay | Admitting: Emergency Medicine

## 2017-08-05 ENCOUNTER — Emergency Department
Admission: EM | Admit: 2017-08-05 | Discharge: 2017-08-05 | Disposition: A | Discharge status: Home / Self Care | Payer: Medicaid Other | Attending: Emergency Medicine | Admitting: Emergency Medicine

## 2017-08-05 DIAGNOSIS — I110 Hypertensive heart disease with heart failure: Secondary | ICD-10-CM | POA: Diagnosis not present

## 2017-08-05 DIAGNOSIS — I5022 Chronic systolic (congestive) heart failure: Secondary | ICD-10-CM | POA: Diagnosis not present

## 2017-08-05 DIAGNOSIS — J45909 Unspecified asthma, uncomplicated: Secondary | ICD-10-CM | POA: Insufficient documentation

## 2017-08-05 DIAGNOSIS — I509 Heart failure, unspecified: Secondary | ICD-10-CM | POA: Diagnosis not present

## 2017-08-05 DIAGNOSIS — Z794 Long term (current) use of insulin: Secondary | ICD-10-CM | POA: Insufficient documentation

## 2017-08-05 DIAGNOSIS — I2510 Atherosclerotic heart disease of native coronary artery without angina pectoris: Secondary | ICD-10-CM | POA: Diagnosis not present

## 2017-08-05 DIAGNOSIS — Z7982 Long term (current) use of aspirin: Secondary | ICD-10-CM | POA: Diagnosis not present

## 2017-08-05 DIAGNOSIS — Z950 Presence of cardiac pacemaker: Secondary | ICD-10-CM | POA: Diagnosis not present

## 2017-08-05 DIAGNOSIS — Z79899 Other long term (current) drug therapy: Secondary | ICD-10-CM | POA: Insufficient documentation

## 2017-08-05 DIAGNOSIS — R1013 Epigastric pain: Secondary | ICD-10-CM | POA: Diagnosis present

## 2017-08-05 DIAGNOSIS — E119 Type 2 diabetes mellitus without complications: Secondary | ICD-10-CM | POA: Diagnosis not present

## 2017-08-05 DIAGNOSIS — I252 Old myocardial infarction: Secondary | ICD-10-CM | POA: Insufficient documentation

## 2017-08-05 DIAGNOSIS — F17210 Nicotine dependence, cigarettes, uncomplicated: Secondary | ICD-10-CM | POA: Diagnosis not present

## 2017-08-05 DIAGNOSIS — K859 Acute pancreatitis without necrosis or infection, unspecified: Secondary | ICD-10-CM | POA: Diagnosis not present

## 2017-08-05 LAB — CBC
HEMATOCRIT: 47 % (ref 40.0–52.0)
Hemoglobin: 16.2 g/dL (ref 13.0–18.0)
MCH: 32.8 pg (ref 26.0–34.0)
MCHC: 34.5 g/dL (ref 32.0–36.0)
MCV: 95.1 fL (ref 80.0–100.0)
Platelets: 185 10*3/uL (ref 150–440)
RBC: 4.94 MIL/uL (ref 4.40–5.90)
RDW: 13.2 % (ref 11.5–14.5)
WBC: 5.3 10*3/uL (ref 3.8–10.6)

## 2017-08-05 LAB — COMPREHENSIVE METABOLIC PANEL
ALT: 45 U/L (ref 17–63)
ANION GAP: 10 (ref 5–15)
AST: 40 U/L (ref 15–41)
Albumin: 3.9 g/dL (ref 3.5–5.0)
Alkaline Phosphatase: 242 U/L — ABNORMAL HIGH (ref 38–126)
BUN: 18 mg/dL (ref 6–20)
CHLORIDE: 97 mmol/L — AB (ref 101–111)
CO2: 26 mmol/L (ref 22–32)
Calcium: 9.1 mg/dL (ref 8.9–10.3)
Creatinine, Ser: 1.04 mg/dL (ref 0.61–1.24)
Glucose, Bld: 341 mg/dL — ABNORMAL HIGH (ref 65–99)
POTASSIUM: 3.9 mmol/L (ref 3.5–5.1)
Sodium: 133 mmol/L — ABNORMAL LOW (ref 135–145)
Total Bilirubin: 1 mg/dL (ref 0.3–1.2)
Total Protein: 8.4 g/dL — ABNORMAL HIGH (ref 6.5–8.1)

## 2017-08-05 LAB — LIPASE, BLOOD: LIPASE: 144 U/L — AB (ref 11–51)

## 2017-08-05 MED ORDER — OXYCODONE-ACETAMINOPHEN 5-325 MG PO TABS
1.0000 | ORAL_TABLET | Freq: Once | ORAL | Status: AC
  Administered 2017-08-05: 1 via ORAL
  Filled 2017-08-05: qty 1

## 2017-08-05 MED ORDER — ONDANSETRON HCL 4 MG/2ML IJ SOLN
4.0000 mg | Freq: Once | INTRAMUSCULAR | Status: AC
  Administered 2017-08-05: 4 mg via INTRAVENOUS
  Filled 2017-08-05: qty 2

## 2017-08-05 MED ORDER — SODIUM CHLORIDE 0.9 % IV BOLUS (SEPSIS)
1000.0000 mL | Freq: Once | INTRAVENOUS | Status: AC
  Administered 2017-08-05: 1000 mL via INTRAVENOUS

## 2017-08-05 MED ORDER — MORPHINE SULFATE (PF) 4 MG/ML IV SOLN
4.0000 mg | Freq: Once | INTRAVENOUS | Status: AC
  Administered 2017-08-05: 4 mg via INTRAVENOUS
  Filled 2017-08-05: qty 1

## 2017-08-05 MED ORDER — OXYCODONE-ACETAMINOPHEN 5-325 MG PO TABS: 1.0000 | ORAL_TABLET | Freq: Four times a day (QID) | ORAL | 0 refills | Status: DC | PRN

## 2017-08-05 NOTE — ED Triage Notes (Signed)
Pt to ED via POV with c/o abd pain x4days , denies n/v/d. Hx of pancreatitis. Pt hx of COPD, wears 3L [place] chronically. VS stable

## 2017-08-05 NOTE — ED Provider Notes (Signed)
[facility] Emergency Department Provider Note  ____________________________________________   First MD Initiated Contact with Patient 08/05/17 1712     (approximate)  I have reviewed the triage vital signs and the nursing notes.   HISTORY  Chief Complaint Abdominal Pain   HPI Oscar [name] is a 41 y.o. male  with history of CAD, CHF as well as hyperlipidemia and pancreatitis was presented to the emergency department today with epigastric pain.  He says that the pain is been ongoing for 3 days and is an 8 out of 10.  He says it radiates through his lower back and feels similar to his past episodes of pancreatitis.  The patient says that he does not drink.  Says that it is suspicion that he may be having these episodes of pancreatitis secondary to a medication he is taking but he does not know which medication it may be.  He denies any nausea, vomiting or diarrhea.  Has had imaging in the past which has showed negative pathology in his gallbladder.   Past Medical History:  Diagnosis Date  . Asthma   . Cardiomyopathy ([place])   . CHF (congestive heart failure) ([place])   . Coronary artery disease   . Deafness in right ear   . Diabetes mellitus without complication ([place])   . Dysrhythmia    svt  . 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 GERD (gastroesophageal reflux disease)   . Hyperlipidemia   . Hypertension   . Hypoxemia   . Myocardial infarction ([place])    0034,9179,1/50  . Pancreatitis   . Sleep apnea    osa    Patient Active Problem List   Diagnosis Date Noted  . Acute pancreatitis 05/19/2017  . Pancreatitis 09/12/2016  . Bronchitis 05/20/2016  . Constipation 11/08/2015  . Abdominal pain 11/02/2015  . Obstructive sleep apnea 07/31/2015  . Tobacco use 07/31/2015  . Diabetes ([place]) 07/10/2015  . Chronic systolic heart failure ([place]) 03/30/2015  . Cardiac pacemaker 03/30/2015  . Chronic respiratory failure with hypercapnia ([place])  12/12/2014  . Elevated troponin   . Non-ischemic cardiomyopathy ([place])   . Hypoxemia   . SVT (supraventricular tachycardia) ([place]) 12/09/2014  . NSTEMI (non-ST elevated myocardial infarction) ([place]) 12/09/2014  . Hypertension 12/09/2014    Past Surgical History:  Procedure Laterality Date  . CARDIAC CATHETERIZATION  12/11/2014   Procedure: RIGHT/LEFT HEART CATH AND CORONARY ANGIOGRAPHY;  Surgeon: Lorretta Harp, MD;  Location: [facility] CATH LAB;  Service: Cardiovascular;;  . ICD LEAD REMOVAL N/A 03/30/2015   Procedure: ICD LEAD REMOVAL;  Surgeon: Marzetta Board, MD;  Location: [facility] ORS;  Service: Cardiovascular;  Laterality: N/A;  . IMPLANTABLE CARDIOVERTER DEFIBRILLATOR IMPLANT    . INSERT / REPLACE / REMOVE PACEMAKER    . LEFT HEART CATHETERIZATION WITH CORONARY ANGIOGRAM N/A 12/09/2014   Procedure: LEFT HEART CATHETERIZATION WITH CORONARY ANGIOGRAM;  Surgeon: Burnell Blanks, MD;  Location[facility] CATH LAB;  Service: Cardiovascular;  Laterality: N/A;    Prior to Admission medications   Medication Sig Start Date End Date Taking? Authorizing Provider  aspirin EC 325 MG tablet Take 1 tablet (325 mg total) by mouth daily. 07/10/15   Aldean Jewett, MD  atorvastatin (LIPITOR) 40 MG tablet Take 1 tablet (40 mg total) by mouth daily at 6 PM. 09/13/16   Nicholes Mango, MD  ENTRESTO 49-51 MG TAKE 1 TABLET BY MOUTH TWO TIMES DAILY 06/17/17   Alisa Graff, FNP  insulin glargine (  LANTUS) 100 UNIT/ML injection Inject 0.3 mLs (30 Units total) into the skin at bedtime. 05/20/17   Epifanio Lesches, MD  insulin starter kit- syringes MISC 1 kit by Other route once. 07/10/15   Aldean Jewett, MD  metFORMIN (GLUCOPHAGE) 1000 MG tablet Take 1,000 mg by mouth 2 (two) times daily with a meal.    [provider]  metoprolol succinate (TOPROL-XL) 25 MG 24 hr tablet TAKE ONE TABLET BY MOUTH DAILY 07/10/17   Darylene Price A, FNP  omeprazole (PRILOSEC) 20 MG capsule Take 1 capsule (20 mg total) by  mouth daily. 04/01/16   Zara Council A, PA-C  ondansetron (ZOFRAN ODT) 8 MG disintegrating tablet Take 1 tablet (8 mg total) by mouth every 8 (eight) hours as needed for nausea or vomiting. Patient not taking: Reported on 05/19/2017 01/29/17   Darel Hong, MD  oxyCODONE-acetaminophen (ROXICET) 5-325 MG tablet Take 2 tablets by mouth every 6 (six) hours as needed. 05/20/17 05/20/18  Epifanio Lesches, MD  polyethylene glycol (MIRALAX / GLYCOLAX) packet Take 17 g by mouth daily.    [provider]  predniSONE (DELTASONE) 10 MG tablet Take 1 tablet (10 mg total) by mouth daily. 05/20/17   Epifanio Lesches, MD  spironolactone (ALDACTONE) 25 MG tablet Take 1 tablet (25 mg total) by mouth daily. 09/19/16 01/29/18  Alisa Graff, FNP  Syringe, Disposable, 1 ML MISC 0.1 mLs by Does not apply route as directed. 08/21/15   Alisa Graff, FNP  tiotropium (SPIRIVA) 18 MCG inhalation capsule Place 18 mcg into inhaler and inhale daily.    [provider]  torsemide (DEMADEX) 20 MG tablet Take 20 mg by mouth daily.     [provider]    Allergies Bidil [isosorb dinitrate-hydralazine]  Family History  Problem Relation Age of Onset  . Hypertension Mother   . Congestive Heart Failure Mother   . Hypertension Sister   . Diabetes Sister   . Pancreatitis Sister   . COPD Sister   . Pancreatitis Brother   . Anemia Neg Hx   . Arrhythmia Neg Hx   . Asthma Neg Hx   . Clotting disorder Neg Hx   . Fainting Neg Hx   . Heart attack Neg Hx   . Heart disease Neg Hx   . Heart failure Neg Hx   . Hyperlipidemia Neg Hx     Social History Social History   Tobacco Use  . Smoking status: Current Every Day Smoker    Packs/day: 0.50    Years: 24.00    Pack years: 12.00    Types: Cigarettes  . Smokeless tobacco: Never Used  . Tobacco comment: 4/5 Smoking 5 cigs a day  Substance Use Topics  . Alcohol use: No  . Drug use: No    Review of Systems  Constitutional: No  fever/chills Eyes: No visual changes. ENT: No sore throat. Cardiovascular: Denies chest pain. Respiratory: Denies shortness of breath. Gastrointestinal:  No nausea, no vomiting.  No diarrhea.  No constipation. Genitourinary: Negative for dysuria. Musculoskeletal: Negative for back pain. Skin: Negative for rash. Neurological: Negative for headaches, focal weakness or numbness.   ____________________________________________   PHYSICAL EXAM:  VITAL SIGNS: ED Triage Vitals  Enc Vitals Group     BP 08/05/17 1627 (!) 134/95     Pulse Rate 08/05/17 1625 88     Resp 08/05/17 1625 16     Temp 08/05/17 1625 97.6 F (36.4 C)     Temp Source 08/05/17 1625 Oral  SpO2 08/05/17 1625 98 %     Weight 08/05/17 1626 (!) 331 lb (150.1 kg)     Height 08/05/17 1626 5' 6"  (1.676 m)     Head Circumference --      Peak Flow --      Pain Score 08/05/17 1625 8     Pain Loc --      Pain Edu? --      Excl. in [place]? --     Constitutional: Alert and oriented. Well appearing and in no acute distress. Eyes: Conjunctivae are normal.  Head: Atraumatic. Nose: No congestion/rhinnorhea. Mouth/Throat: Mucous membranes are moist.  Neck: No stridor.   Cardiovascular: Normal rate, regular rhythm. Grossly normal heart sounds.   Respiratory: Normal respiratory effort.  No retractions. Lungs CTAB. Gastrointestinal: Soft with moderate epigastric tenderness to palpation.  No distention.  Musculoskeletal: No lower extremity tenderness nor edema.  No joint effusions. Neurologic:  Normal speech and language. No gross focal neurologic deficits are appreciated. Skin:  Skin is warm, dry and intact. No rash noted. Psychiatric: Mood and affect are normal. Speech and behavior are normal.  ____________________________________________   LABS (all labs ordered are listed, but only abnormal results are displayed)  Labs Reviewed  LIPASE, BLOOD - Abnormal; Notable for the following components:      Result Value    Lipase 144 (*)    All other components within normal limits  COMPREHENSIVE METABOLIC PANEL - Abnormal; Notable for the following components:   Sodium 133 (*)    Chloride 97 (*)    Glucose, Bld 341 (*)    Total Protein 8.4 (*)    Alkaline Phosphatase 242 (*)    All other components within normal limits  CBC  URINALYSIS, COMPLETE (UACMP) WITH MICROSCOPIC   ____________________________________________  EKG   ____________________________________________  RADIOLOGY   ____________________________________________   PROCEDURES  Procedure(s) performed:   Procedures  Critical Care performed:   ____________________________________________   INITIAL IMPRESSION / ASSESSMENT AND PLAN / ED COURSE  Pertinent labs & imaging results that were available during my care of the patient were reviewed by me and considered in my medical decision making (see chart for details).  Differential diagnosis includes, but is not limited to, biliary disease (biliary colic, acute cholecystitis, cholangitis, choledocholithiasis, etc), intrathoracic causes for epigastric abdominal pain including ACS, gastritis, duodenitis, pancreatitis, small bowel or large bowel obstruction, abdominal aortic aneurysm, hernia, and gastritis.  As part of my medical decision making, I reviewed the following data within the [place] reviewed reviewed the patient's past triglyceride levels that are in the 100s.  Does not have triglyceride levels that appear to be high enough to cause recurrent pancreatitis.  And also reviewed Notes from prior ED visits   ----------------------------------------- 6:44 PM on 08/05/2017 -----------------------------------------  Patient says that his pain is greatly improved after morphine dosing.  He has not had any nausea or vomiting throughout his stay in the emergency I believe it is appropriate at this time for the patient be discharged home.  We discussed  discharge and he feels comfortable with the plan.  He says he still has Zofran at home if needed.  He will be given a course of Percocet as it was the last time he was here as well.  Unable to access the patient's [facility] substance controlled registry as I the log of the website and it was inoperable due to "site is currently unavailable" message on webpage.  Patient will be only a simple  diet and progress back to his normal diet as tolerated.  He is understanding of the plan and willing to comply.     ____________________________________________   FINAL CLINICAL IMPRESSION(S) / ED DIAGNOSES  Pancreatitis.    NEW MEDICATIONS STARTED DURING THIS VISIT:  This SmartLink is deprecated. Use AVSMEDLIST instead to display the medication list for a patient.   Note:  This document was prepared using Dragon voice recognition software and may include unintentional dictation errors.     Orbie Pyo, MD 08/05/17 [phone]

## 2017-08-13 ENCOUNTER — Emergency Department: Payer: Medicaid Other

## 2017-08-13 ENCOUNTER — Other Ambulatory Visit: Payer: Self-pay

## 2017-08-13 ENCOUNTER — Emergency Department
Admission: EM | Admit: 2017-08-13 | Discharge: 2017-08-13 | Disposition: A | Discharge status: Home / Self Care | Payer: Medicaid Other | Attending: Emergency Medicine | Admitting: Emergency Medicine

## 2017-08-13 DIAGNOSIS — Z950 Presence of cardiac pacemaker: Secondary | ICD-10-CM | POA: Insufficient documentation

## 2017-08-13 DIAGNOSIS — K859 Acute pancreatitis without necrosis or infection, unspecified: Secondary | ICD-10-CM

## 2017-08-13 DIAGNOSIS — R1010 Upper abdominal pain, unspecified: Secondary | ICD-10-CM | POA: Diagnosis present

## 2017-08-13 DIAGNOSIS — R778 Other specified abnormalities of plasma proteins: Secondary | ICD-10-CM

## 2017-08-13 DIAGNOSIS — I2510 Atherosclerotic heart disease of native coronary artery without angina pectoris: Secondary | ICD-10-CM | POA: Insufficient documentation

## 2017-08-13 DIAGNOSIS — I110 Hypertensive heart disease with heart failure: Secondary | ICD-10-CM | POA: Insufficient documentation

## 2017-08-13 DIAGNOSIS — I5022 Chronic systolic (congestive) heart failure: Secondary | ICD-10-CM | POA: Diagnosis not present

## 2017-08-13 DIAGNOSIS — F17210 Nicotine dependence, cigarettes, uncomplicated: Secondary | ICD-10-CM | POA: Insufficient documentation

## 2017-08-13 DIAGNOSIS — I252 Old myocardial infarction: Secondary | ICD-10-CM | POA: Insufficient documentation

## 2017-08-13 DIAGNOSIS — Z794 Long term (current) use of insulin: Secondary | ICD-10-CM | POA: Diagnosis not present

## 2017-08-13 DIAGNOSIS — R7989 Other specified abnormal findings of blood chemistry: Secondary | ICD-10-CM

## 2017-08-13 DIAGNOSIS — Z7982 Long term (current) use of aspirin: Secondary | ICD-10-CM | POA: Insufficient documentation

## 2017-08-13 DIAGNOSIS — E119 Type 2 diabetes mellitus without complications: Secondary | ICD-10-CM | POA: Insufficient documentation

## 2017-08-13 DIAGNOSIS — Z79899 Other long term (current) drug therapy: Secondary | ICD-10-CM | POA: Insufficient documentation

## 2017-08-13 DIAGNOSIS — J45909 Unspecified asthma, uncomplicated: Secondary | ICD-10-CM | POA: Insufficient documentation

## 2017-08-13 DIAGNOSIS — R1013 Epigastric pain: Secondary | ICD-10-CM | POA: Diagnosis not present

## 2017-08-13 DIAGNOSIS — I10 Essential (primary) hypertension: Secondary | ICD-10-CM | POA: Diagnosis not present

## 2017-08-13 LAB — CBC WITH DIFFERENTIAL/PLATELET
BASOS ABS: 0 10*3/uL (ref 0–0.1)
Basophils Relative: 1 %
EOS ABS: 0.1 10*3/uL (ref 0–0.7)
EOS PCT: 2 %
HCT: 46.7 % (ref 40.0–52.0)
Hemoglobin: 16.4 g/dL (ref 13.0–18.0)
Lymphocytes Relative: 47 %
Lymphs Abs: 2.7 10*3/uL (ref 1.0–3.6)
MCH: 32.8 pg (ref 26.0–34.0)
MCHC: 35.1 g/dL (ref 32.0–36.0)
MCV: 93.4 fL (ref 80.0–100.0)
MONO ABS: 0.3 10*3/uL (ref 0.2–1.0)
Monocytes Relative: 6 %
Neutro Abs: 2.5 10*3/uL (ref 1.4–6.5)
Neutrophils Relative %: 44 %
PLATELETS: 200 10*3/uL (ref 150–440)
RBC: 5 MIL/uL (ref 4.40–5.90)
RDW: 13.2 % (ref 11.5–14.5)
WBC: 5.7 10*3/uL (ref 3.8–10.6)

## 2017-08-13 LAB — BASIC METABOLIC PANEL
Anion gap: 7 (ref 5–15)
BUN: 24 mg/dL — AB (ref 6–20)
CALCIUM: 8.6 mg/dL — AB (ref 8.9–10.3)
CO2: 28 mmol/L (ref 22–32)
CREATININE: 1.1 mg/dL (ref 0.61–1.24)
Chloride: 98 mmol/L — ABNORMAL LOW (ref 101–111)
GFR calc non Af Amer: >60 mL/min (ref >60)
Glucose, Bld: 326 mg/dL — ABNORMAL HIGH (ref 65–99)
Potassium: 3.4 mmol/L — ABNORMAL LOW (ref 3.5–5.1)
SODIUM: 133 mmol/L — AB (ref 135–145)

## 2017-08-13 LAB — HEPATIC FUNCTION PANEL
ALT: 41 U/L (ref 17–63)
AST: 34 U/L (ref 15–41)
Albumin: 3.8 g/dL (ref 3.5–5.0)
Alkaline Phosphatase: 252 U/L — ABNORMAL HIGH (ref 38–126)
Bilirubin, Direct: 0.2 mg/dL (ref 0.1–0.5)
Indirect Bilirubin: 0.5 mg/dL (ref 0.3–0.9)
TOTAL PROTEIN: 8 g/dL (ref 6.5–8.1)
Total Bilirubin: 0.7 mg/dL (ref 0.3–1.2)

## 2017-08-13 LAB — TROPONIN I
TROPONIN I: 0.07 ng/mL — AB (ref <0.03)
Troponin I: 0.07 ng/mL (ref <0.03)

## 2017-08-13 LAB — LACTIC ACID, PLASMA: LACTIC ACID, VENOUS: 1.3 mmol/L (ref 0.5–1.9)

## 2017-08-13 LAB — LIPASE, BLOOD: Lipase: 128 U/L — ABNORMAL HIGH (ref 11–51)

## 2017-08-13 MED ORDER — MORPHINE SULFATE (PF) 4 MG/ML IV SOLN
8.0000 mg | Freq: Once | INTRAVENOUS | Status: AC
  Administered 2017-08-13: 8 mg via INTRAVENOUS
  Filled 2017-08-13: qty 2

## 2017-08-13 MED ORDER — SODIUM CHLORIDE 0.9 % IV BOLUS (SEPSIS): 1000.0000 mL | Freq: Once | INTRAVENOUS | Status: DC

## 2017-08-13 MED ORDER — OXYCODONE HCL 5 MG PO TABS: 5.0000 mg | ORAL_TABLET | ORAL | 0 refills | Status: DC | PRN

## 2017-08-13 MED ORDER — ONDANSETRON 4 MG PO TBDP: 4.0000 mg | ORAL_TABLET | Freq: Three times a day (TID) | ORAL | 0 refills | Status: DC | PRN

## 2017-08-13 MED ORDER — SODIUM CHLORIDE 0.9 % IV BOLUS (SEPSIS)
500.0000 mL | Freq: Once | INTRAVENOUS | Status: AC
  Administered 2017-08-13: 500 mL via INTRAVENOUS

## 2017-08-13 MED ORDER — IOPAMIDOL (ISOVUE-300) INJECTION 61%
125.0000 mL | Freq: Once | INTRAVENOUS | Status: AC | PRN
  Administered 2017-08-13: 125 mL via INTRAVENOUS

## 2017-08-13 MED ORDER — HALOPERIDOL LACTATE 5 MG/ML IJ SOLN
5.0000 mg | Freq: Once | INTRAMUSCULAR | Status: AC
  Administered 2017-08-13: 5 mg via INTRAVENOUS
  Filled 2017-08-13: qty 1

## 2017-08-13 NOTE — ED Provider Notes (Addendum)
This patient was signed out to me by Dr. Mable Paris.  41 year old male with multiple chronic comorbidities including cardiomyopathy, CHF, CAD, and chronic recurrent pancreatitis presenting with abdominal pain.  Overall, the patient is hemodynamically stable and afebrile.  I have reevaluated the patient myself, and he reports several days of abdominal pain but is able to tolerate food and liquids without any difficulty and has not been having any fever or diarrhea.  The patient CT scan does show mild pancreatitis without any complicating factor.  His EKG does not show ischemic changes, and he has a troponin of 0.07.  It appears that the patient is a chronically elevated troponin, and I have discussed his symptoms with him at length.  He reports that he has chest "discomfort" when he lays back, which she has had for years.  He also reports occasional shortness of breath, which she has also had for years.  Neither of these symptoms are new in character or changed in severity.  The patient was admitted in September for pancreatitis with elevated troponin, and was conservatively managed for both; at that time, his pancreatic flare was much more significant with lipases in the 1100s.  Today, the patient's lipase is only 128.  We will plan to get a second troponin to trend, but if it is similar, will speak to Dr. [name], the patient's primary cardiologist.  Plan reevaluation for final disposition .  ----------------------------------------- 9:56 AM on 08/13/2017 -----------------------------------------  At this time, the patient's pain is well controlled and he is tolerating liquids.  His repeat troponin was again 0.07.  Spoken with Dr. Nehemiah Massed, who is on-call for Dr. [name], who agrees with the plan for outpatient follow-up.  I have also spoken with the nurse practitioner at [facility], the patient has been scheduled for a follow-up appointment tomorrow at 11:20 AM.  Return precautions as well  as follow-up instructions were discussed.  The patient is safe for discharge at this time.   Eula Listen, MD 08/13/17 [id number]    Eula Listen, MD 08/13/17 [phone]

## 2017-08-13 NOTE — ED Provider Notes (Signed)
[facility] Emergency Department Provider Note  ____________________________________________   First MD Initiated Contact with Patient 08/13/17 [phone]     (approximate)  I have reviewed the triage vital signs and the nursing notes.   HISTORY  Chief Complaint Abdominal Pain   HPI Oscar [name] is a 41 y.o. male who comes to the emergency department via EMS with 2-3 days of gradual onset severe diffuse upper abdominal pain radiating to his back associated with nausea and decreased appetite.  He has vomited several times.  He has a long-standing history of chronic pancreatitis and was most recently diagnosed with "mild pancreatitis" 1 week ago at our hospital.  His symptoms initially improved however they recurred and he came back.  He has no history of abdominal surgeries.  He does have a history of congestive heart failure, coronary artery disease, diabetes mellitus, and COPD.  He was given no pain medications in route.  His symptoms seem to be worse with eating and somewhat improved when not.  Past Medical History:  Diagnosis Date  . Asthma   . Cardiomyopathy ([place])   . CHF (congestive heart failure) ([place])   . Coronary artery disease   . Deafness in right ear   . Diabetes mellitus without complication ([place])   . Dysrhythmia    svt  . 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 GERD (gastroesophageal reflux disease)   . Hyperlipidemia   . Hypertension   . Hypoxemia   . Myocardial infarction ([place])    3220,2542,7/06  . Pancreatitis   . Sleep apnea    osa    Patient Active Problem List   Diagnosis Date Noted  . Acute pancreatitis 05/19/2017  . Pancreatitis 09/12/2016  . Bronchitis 05/20/2016  . Constipation 11/08/2015  . Abdominal pain 11/02/2015  . Obstructive sleep apnea 07/31/2015  . Tobacco use 07/31/2015  . Diabetes ([place]) 07/10/2015  . Chronic systolic heart failure ([place]) 03/30/2015  . Cardiac pacemaker 03/30/2015  . Chronic respiratory  failure with hypercapnia ([place]) 12/12/2014  . Elevated troponin   . Non-ischemic cardiomyopathy ([place])   . Hypoxemia   . SVT (supraventricular tachycardia) ([place]) 12/09/2014  . NSTEMI (non-ST elevated myocardial infarction) ([place]) 12/09/2014  . Hypertension 12/09/2014    Past Surgical History:  Procedure Laterality Date  . CARDIAC CATHETERIZATION  12/11/2014   Procedure: RIGHT/LEFT HEART CATH AND CORONARY ANGIOGRAPHY;  Surgeon: Lorretta Harp, MD;  Location: [facility] CATH LAB;  Service: Cardiovascular;;  . ICD LEAD REMOVAL N/A 03/30/2015   Procedure: ICD LEAD REMOVAL;  Surgeon: Marzetta Board, MD;  Location: [facility] ORS;  Service: Cardiovascular;  Laterality: N/A;  . IMPLANTABLE CARDIOVERTER DEFIBRILLATOR IMPLANT    . INSERT / REPLACE / REMOVE PACEMAKER    . LEFT HEART CATHETERIZATION WITH CORONARY ANGIOGRAM N/A 12/09/2014   Procedure: LEFT HEART CATHETERIZATION WITH CORONARY ANGIOGRAM;  Surgeon: Burnell Blanks, MD;  Location: [facility] CATH LAB;  Service: Cardiovascular;  Laterality: N/A;    Prior to Admission medications   Medication Sig Start Date End Date Taking? Authorizing Provider  aspirin EC 325 MG tablet Take 1 tablet (325 mg total) by mouth daily. 07/10/15   Aldean Jewett, MD  atorvastatin (LIPITOR) 40 MG tablet Take 1 tablet (40 mg total) by mouth daily at 6 PM. 09/13/16   Nicholes Mango, MD  ENTRESTO 49-51 MG TAKE 1 TABLET BY MOUTH TWO TIMES DAILY 06/17/17   Darylene Price A, FNP  insulin glargine (LANTUS) 100 UNIT/ML injection Inject 0.3  mLs (30 Units total) into the skin at bedtime. 05/20/17   Epifanio Lesches, MD  insulin starter kit- syringes MISC 1 kit by Other route once. 07/10/15   Aldean Jewett, MD  metFORMIN (GLUCOPHAGE) 1000 MG tablet Take 1,000 mg by mouth 2 (two) times daily with a meal.    [provider]  metoprolol succinate (TOPROL-XL) 25 MG 24 hr tablet TAKE ONE TABLET BY MOUTH DAILY 07/10/17   Darylene Price A, FNP  omeprazole (PRILOSEC) 20 MG capsule Take  1 capsule (20 mg total) by mouth daily. 04/01/16   Zara Council A, PA-C  ondansetron (ZOFRAN ODT) 8 MG disintegrating tablet Take 1 tablet (8 mg total) by mouth every 8 (eight) hours as needed for nausea or vomiting. Patient not taking: Reported on 05/19/2017 01/29/17   Darel Hong, MD  oxyCODONE-acetaminophen (ROXICET) 5-325 MG tablet Take 1-2 tablets by mouth every 6 (six) hours as needed. 08/05/17   Schaevitz, Randall An, MD  polyethylene glycol [facility] / Floria Raveling) packet Take 17 g by mouth daily.    [provider]  predniSONE (DELTASONE) 10 MG tablet Take 1 tablet (10 mg total) by mouth daily. 05/20/17   Epifanio Lesches, MD  spironolactone (ALDACTONE) 25 MG tablet Take 1 tablet (25 mg total) by mouth daily. 09/19/16 01/29/18  Alisa Graff, FNP  Syringe, Disposable, 1 ML MISC 0.1 mLs by Does not apply route as directed. 08/21/15   Alisa Graff, FNP  tiotropium (SPIRIVA) 18 MCG inhalation capsule Place 18 mcg into inhaler and inhale daily.    [provider]  torsemide (DEMADEX) 20 MG tablet Take 20 mg by mouth daily.     [provider]    Allergies Bidil [isosorb dinitrate-hydralazine]  Family History  Problem Relation Age of Onset  . Hypertension Mother   . Congestive Heart Failure Mother   . Hypertension Sister   . Diabetes Sister   . Pancreatitis Sister   . COPD Sister   . Pancreatitis Brother   . Anemia Neg Hx   . Arrhythmia Neg Hx   . Asthma Neg Hx   . Clotting disorder Neg Hx   . Fainting Neg Hx   . Heart attack Neg Hx   . Heart disease Neg Hx   . Heart failure Neg Hx   . Hyperlipidemia Neg Hx     Social History Social History   Tobacco Use  . Smoking status: Current Every Day Smoker    Packs/day: 0.50    Years: 24.00    Pack years: 12.00    Types: Cigarettes  . Smokeless tobacco: Never Used  . Tobacco comment: 4/5 Smoking 5 cigs a day  Substance Use Topics  . Alcohol use: No  . Drug use: No    Review of  Systems Constitutional: No fever/chills Eyes: No visual changes. ENT: No sore throat. Cardiovascular: Denies chest pain. Respiratory: Denies shortness of breath. Gastrointestinal: Positive for abdominal pain.  Positive for nausea, positive for vomiting.  No diarrhea.  No constipation. Genitourinary: Negative for dysuria. Musculoskeletal: Negative for back pain. Skin: Negative for rash. Neurological: Negative for headaches, focal weakness or numbness.   ____________________________________________   PHYSICAL EXAM:  VITAL SIGNS: ED Triage Vitals  Enc Vitals Group     BP      Pulse      Resp      Temp      Temp src      SpO2      Weight  Height      Head Circumference      Peak Flow      Pain Score      Pain Loc      Pain Edu?      Excl. in [place]?     Constitutional: Alert and oriented x4 appears very uncomfortable moaning and groaning in pain Eyes: PERRL EOMI. Head: Atraumatic. Nose: No congestion/rhinnorhea. Mouth/Throat: No trismus Neck: No stridor.   Cardiovascular: Normal rate, regular rhythm. Grossly normal heart sounds.  Good peripheral circulation. Respiratory: Normal respiratory effort.  No retractions. Lungs CTAB and moving good air Gastrointestinal: Obese abdomen diffusely tender with no focality he does have a periumbilical hernia with no skin changes although I am unable to reduce it at this time Musculoskeletal: No lower extremity edema   Neurologic:  Normal speech and language. No gross focal neurologic deficits are appreciated. Skin:  Skin is warm, dry and intact. No rash noted. Psychiatric: Mood and affect are normal. Speech and behavior are normal.    ____________________________________________   DIFFERENTIAL includes but not limited to  Pancreatitis, incarcerated hernia, strangulated hernia, small bowel obstruction, appendicitis, diverticulitis ____________________________________________   LABS (all labs ordered are listed, but only  abnormal results are displayed)  Labs Reviewed  CBC WITH DIFFERENTIAL/PLATELET  LACTIC ACID, PLASMA  LACTIC ACID, PLASMA  TROPONIN I  BASIC METABOLIC PANEL  HEPATIC FUNCTION PANEL  LIPASE, BLOOD    Blood work is pending __________________________________________  EKG  ED ECG REPORT I, Darel Hong, the attending physician, personally viewed and interpreted this ECG.  Date: 08/13/2017 EKG Time:  Rate: 83 Rhythm: normal sinus rhythm Intervals: normal ST/T Wave abnormalities: normal Narrative Interpretation: no evidence of acute ischemia.  Left bundle branch block with no signs of acute disease  ____________________________________________  RADIOLOGY  CT abdomen pelvis is pending ____________________________________________   PROCEDURES  Procedure(s) performed: no  Procedures  Critical Care performed: no  Observation: no ____________________________________________   INITIAL IMPRESSION / ASSESSMENT AND PLAN / ED COURSE  Pertinent labs & imaging results that were available during my care of the patient were reviewed by me and considered in my medical decision making (see chart for details).  On arrival the patient is extremely uncomfortable appearing with a diffusely tender abdomen.  He most likely does have worsening pancreatitis, however of concern is also the umbilical hernia that I am unable to reduce.  He is tensing his abdominal muscles secondary to pain and swelling give him 8 mg of morphine as well as 5 mg of haloperidol for nausea now and reevaluate.  He does require CT scan with IV contrast today.  500 cc of normal saline instead of aggressive boluses secondary to his known congestive heart failure.      ____________________________________________   FINAL CLINICAL IMPRESSION(S) / ED DIAGNOSES  Final diagnoses:  Epigastric pain      NEW MEDICATIONS STARTED DURING THIS VISIT:  This SmartLink is deprecated. Use AVSMEDLIST instead to display  the medication list for a patient.   Note:  This document was prepared using Dragon voice recognition software and may include unintentional dictation errors.     Darel Hong, MD 08/13/17 [phone]

## 2017-08-13 NOTE — ED Notes (Signed)
Patient given water to drink for PO challenge per MD. Will continue to monitor for N/V.

## 2017-08-13 NOTE — ED Notes (Signed)
Recollect green tube sent to lab 

## 2017-08-13 NOTE — ED Triage Notes (Addendum)
Pt comes in via [facility] with c/o abdominal pain and nausea for past 2 days.  Per EMS vitals stable, [id number]. Pt A&OX4. Respirations even and unlabored.Pt also states he took AZO for possible kidney infection and back pain.

## 2017-08-13 NOTE — Discharge Instructions (Signed)
Please take a clear liquid diet until your pain has completely resolved, then advance to a bland diet as tolerated.  Your primary care physician's office has scheduled you for an appointment at 11:20am tomorrow.  Your appointment for today has been counseled.  You may take Tylenol or Motrin for mild to moderate pain, and oxycodone for severe pain.  Do not drive within 8 hours of taking oxycodone per  Please return to the emergency department if you develop severe pain, inability to keep down fluids, fever, or any other symptoms concerning to you.

## 2017-08-20 ENCOUNTER — Other Ambulatory Visit: Payer: Self-pay

## 2017-08-20 ENCOUNTER — Emergency Department
Admission: EM | Admit: 2017-08-20 | Discharge: 2017-08-20 | Disposition: A | Discharge status: Home / Self Care | Payer: Medicaid Other | Attending: Emergency Medicine | Admitting: Emergency Medicine

## 2017-08-20 DIAGNOSIS — R109 Unspecified abdominal pain: Secondary | ICD-10-CM

## 2017-08-20 DIAGNOSIS — Z7982 Long term (current) use of aspirin: Secondary | ICD-10-CM | POA: Insufficient documentation

## 2017-08-20 DIAGNOSIS — J45909 Unspecified asthma, uncomplicated: Secondary | ICD-10-CM | POA: Diagnosis not present

## 2017-08-20 DIAGNOSIS — R1031 Right lower quadrant pain: Secondary | ICD-10-CM | POA: Insufficient documentation

## 2017-08-20 DIAGNOSIS — I110 Hypertensive heart disease with heart failure: Secondary | ICD-10-CM | POA: Insufficient documentation

## 2017-08-20 DIAGNOSIS — I252 Old myocardial infarction: Secondary | ICD-10-CM | POA: Diagnosis not present

## 2017-08-20 DIAGNOSIS — I5022 Chronic systolic (congestive) heart failure: Secondary | ICD-10-CM | POA: Diagnosis not present

## 2017-08-20 DIAGNOSIS — F17210 Nicotine dependence, cigarettes, uncomplicated: Secondary | ICD-10-CM | POA: Insufficient documentation

## 2017-08-20 DIAGNOSIS — N179 Acute kidney failure, unspecified: Secondary | ICD-10-CM | POA: Diagnosis not present

## 2017-08-20 DIAGNOSIS — Z79899 Other long term (current) drug therapy: Secondary | ICD-10-CM | POA: Insufficient documentation

## 2017-08-20 DIAGNOSIS — I2510 Atherosclerotic heart disease of native coronary artery without angina pectoris: Secondary | ICD-10-CM | POA: Insufficient documentation

## 2017-08-20 DIAGNOSIS — E119 Type 2 diabetes mellitus without complications: Secondary | ICD-10-CM | POA: Diagnosis not present

## 2017-08-20 DIAGNOSIS — R300 Dysuria: Secondary | ICD-10-CM | POA: Diagnosis not present

## 2017-08-20 DIAGNOSIS — Z794 Long term (current) use of insulin: Secondary | ICD-10-CM | POA: Insufficient documentation

## 2017-08-20 DIAGNOSIS — R319 Hematuria, unspecified: Secondary | ICD-10-CM | POA: Diagnosis present

## 2017-08-20 LAB — BASIC METABOLIC PANEL
ANION GAP: 9 (ref 5–15)
BUN: 43 mg/dL — ABNORMAL HIGH (ref 6–20)
CALCIUM: 8.8 mg/dL — AB (ref 8.9–10.3)
CO2: 22 mmol/L (ref 22–32)
CREATININE: 1.59 mg/dL — AB (ref 0.61–1.24)
Chloride: 103 mmol/L (ref 101–111)
GFR, EST NON AFRICAN AMERICAN: 52 mL/min — AB (ref >60)
Glucose, Bld: 179 mg/dL — ABNORMAL HIGH (ref 65–99)
Potassium: 4.2 mmol/L (ref 3.5–5.1)
Sodium: 134 mmol/L — ABNORMAL LOW (ref 135–145)

## 2017-08-20 LAB — URINALYSIS, COMPLETE (UACMP) WITH MICROSCOPIC
BACTERIA UA: NONE SEEN
BILIRUBIN URINE: NEGATIVE
Glucose, UA: NEGATIVE mg/dL
KETONES UR: NEGATIVE mg/dL
LEUKOCYTES UA: NEGATIVE
Nitrite: NEGATIVE
PH: 5 (ref 5.0–8.0)
PROTEIN: 100 mg/dL — AB
Specific Gravity, Urine: 1.02 (ref 1.005–1.030)

## 2017-08-20 LAB — CBC
HCT: 47.8 % (ref 40.0–52.0)
HEMOGLOBIN: 16.7 g/dL (ref 13.0–18.0)
MCH: 32.6 pg (ref 26.0–34.0)
MCHC: 34.9 g/dL (ref 32.0–36.0)
MCV: 93.6 fL (ref 80.0–100.0)
PLATELETS: 202 10*3/uL (ref 150–440)
RBC: 5.11 MIL/uL (ref 4.40–5.90)
RDW: 12.9 % (ref 11.5–14.5)
WBC: 5.3 10*3/uL (ref 3.8–10.6)

## 2017-08-20 MED ORDER — SODIUM CHLORIDE 0.9 % IV BOLUS (SEPSIS): 1000.0000 mL | Freq: Once | INTRAVENOUS | Status: DC

## 2017-08-20 MED ORDER — CEPHALEXIN 500 MG PO CAPS: 500.0000 mg | ORAL_CAPSULE | Freq: Two times a day (BID) | ORAL | 0 refills | Status: DC

## 2017-08-20 MED ORDER — CEPHALEXIN 500 MG PO CAPS
500.0000 mg | ORAL_CAPSULE | Freq: Once | ORAL | Status: AC
  Administered 2017-08-20: 500 mg via ORAL
  Filled 2017-08-20: qty 1

## 2017-08-20 MED ORDER — TAMSULOSIN HCL 0.4 MG PO CAPS: 0.4000 mg | ORAL_CAPSULE | Freq: Every day | ORAL | 0 refills | Status: DC

## 2017-08-20 NOTE — Discharge Instructions (Signed)
You are evaluated for blood in the urine yesterday although your urine sample today does not show blood in it, there are some white blood cells which could be related to inflammation or infection.  We discussed whether or not to wait for a urine culture or go ahead and treat upfront for what feels like a urinary tract infection to you, and chose to proceed to go ahead and treat with antibiotic called Keflex.  Your kidney function was slightly decreased, which may be related to dehydration.  Make sure you take plenty of fluids.  We discussed the possibility of kidney stone with the blood in the urine yesterday and the right sided flank pain which is intermittent, and decided to hold off on CT scan at this point time, but if you have pain that is ongoing after 1 week, you would need further evaluation and possibly CT scan for further investigation.  Return to the emergency room immediately for any worsening condition including new or uncontrolled pain, weakness, numbness, black or bloody stool, fever, or any other symptoms concerning to you.

## 2017-08-20 NOTE — ED Provider Notes (Signed)
[facility] Emergency Department Provider Note ____________________________________________   I have reviewed the triage vital signs and the triage nursing note.  HISTORY  Chief Complaint Hematuria   Historian Patient  HPI Oscar [name] is a 41 y.o. male presents for evaluation of possible urinary tract infection.  Patient states that he has had urinary frequency and some burning and some flank discomfort and he thinks is probably urinary tract infection.  Yesterday he noticed blood in the urine.  No current pain, but he had pain in his low back and right flank yesterday.  Denies history of urinary tract infection.  Denies penile discharge or exposure or concern about STD.  States that he has been seen twice in the past month for pancreatitis and upper abdominal discomfort which is there occasionally, but he is not here for that are concerned about that at this point.  He wears oxygen at home and sisters bring his portable oxygen tank.    Past Medical History:  Diagnosis Date  . Asthma   . Cardiomyopathy ([place])   . CHF (congestive heart failure) ([place])   . Coronary artery disease   . Deafness in right ear   . Diabetes mellitus without complication ([place])   . Dysrhythmia    svt  . 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 GERD (gastroesophageal reflux disease)   . Hyperlipidemia   . Hypertension   . Hypoxemia   . Myocardial infarction ([place])    6811,5726,2/03  . Pancreatitis   . Sleep apnea    osa    Patient Active Problem List   Diagnosis Date Noted  . Acute pancreatitis 05/19/2017  . Pancreatitis 09/12/2016  . Bronchitis 05/20/2016  . Constipation 11/08/2015  . Abdominal pain 11/02/2015  . Obstructive sleep apnea 07/31/2015  . Tobacco use 07/31/2015  . Diabetes ([place]) 07/10/2015  . Chronic systolic heart failure ([place]) 03/30/2015  . Cardiac pacemaker 03/30/2015  . Chronic respiratory failure with hypercapnia ([place]) 12/12/2014  .  Elevated troponin   . Non-ischemic cardiomyopathy ([place])   . Hypoxemia   . SVT (supraventricular tachycardia) ([place]) 12/09/2014  . NSTEMI (non-ST elevated myocardial infarction) ([place]) 12/09/2014  . Hypertension 12/09/2014    Past Surgical History:  Procedure Laterality Date  . CARDIAC CATHETERIZATION  12/11/2014   Procedure: RIGHT/LEFT HEART CATH AND CORONARY ANGIOGRAPHY;  Surgeon: Lorretta Harp, MD;  Location: [facility] CATH LAB;  Service: Cardiovascular;;  . ICD LEAD REMOVAL N/A 03/30/2015   Procedure: ICD LEAD REMOVAL;  Surgeon: Marzetta Board, MD;  Location: [facility] ORS;  Service: Cardiovascular;  Laterality: N/A;  . IMPLANTABLE CARDIOVERTER DEFIBRILLATOR IMPLANT    . INSERT / REPLACE / REMOVE PACEMAKER    . LEFT HEART CATHETERIZATION WITH CORONARY ANGIOGRAM N/A 12/09/2014   Procedure: LEFT HEART CATHETERIZATION WITH CORONARY ANGIOGRAM;  Surgeon: Burnell Blanks, MD;  [facility] C[facility] LAB;  Service: Cardiovascular;  Laterality: N/A;    Prior to Admission medications   Medication Sig Start Date End Date Taking? Authorizing Provider  aspirin EC 325 MG tablet Take 1 tablet (325 mg total) by mouth daily. 07/10/15  Yes Aldean Jewett, MD  atorvastatin (LIPITOR) 40 MG tablet Take 1 tablet (40 mg total) by mouth daily at 6 PM. 09/13/16  Yes Gouru, Aruna, MD  ENTRESTO 49-51 MG TAKE 1 TABLET BY MOUTH TWO TIMES DAILY 06/17/17  Yes Darylene Price A, FNP  insulin glargine (LANTUS) 100 UNIT/ML injection Inject 0.3 mLs (30 Units total) into the skin at  bedtime. 05/20/17  Yes Epifanio Lesches, MD  insulin starter kit- syringes MISC 1 kit by Other route once. 07/10/15  Yes Aldean Jewett, MD  metFORMIN (GLUCOPHAGE) 1000 MG tablet Take 1,000 mg by mouth 2 (two) times daily with a meal.   Yes [provider]  metoprolol succinate (TOPROL-XL) 25 MG 24 hr tablet TAKE ONE TABLET BY MOUTH DAILY 07/10/17  Yes Darylene Price A, FNP  omeprazole (PRILOSEC) 20 MG capsule Take 1 capsule (20 mg total)  by mouth daily. 04/01/16  Yes McGowan, Larene Beach A, PA-C  spironolactone (ALDACTONE) 25 MG tablet Take 1 tablet (25 mg total) by mouth daily. 09/19/16 01/29/18 Yes Hackney, Otila Kluver A, FNP  tiotropium (SPIRIVA) 18 MCG inhalation capsule Place 18 mcg into inhaler and inhale daily.   Yes [provider]  torsemide (DEMADEX) 20 MG tablet Take 20 mg by mouth daily.    Yes [provider]  cephALEXin (KEFLEX) 500 MG capsule Take 1 capsule (500 mg total) by mouth 2 (two) times daily. 08/20/17   Lisa Roca, MD  ondansetron (ZOFRAN ODT) 4 MG disintegrating tablet Take 1 tablet (4 mg total) by mouth every 8 (eight) hours as needed for nausea or vomiting. Patient not taking: Reported on 08/20/2017 08/13/17   Eula Listen, MD  oxyCODONE (ROXICODONE) 5 MG immediate release tablet Take 1 tablet (5 mg total) by mouth every 4 (four) hours as needed for severe pain. Patient not taking: Reported on 08/20/2017 08/13/17 08/13/18  Eula Listen, MD  oxyCODONE-acetaminophen (ROXICET) 5-325 MG tablet Take 1-2 tablets by mouth every 6 (six) hours as needed. Patient not taking: Reported on 08/20/2017 08/05/17   Orbie Pyo, MD  polyethylene glycol [facility] / Floria Raveling) packet Take 17 g by mouth daily.    [provider]  predniSONE (DELTASONE) 10 MG tablet Take 1 tablet (10 mg total) by mouth daily. Patient not taking: Reported on 08/20/2017 05/20/17   Epifanio Lesches, MD  Syringe, Disposable, 1 ML MISC 0.1 mLs by Does not apply route as directed. 08/21/15   Alisa Graff, FNP  tamsulosin (FLOMAX) 0.4 MG CAPS capsule Take 1 capsule (0.4 mg total) by mouth daily. 08/20/17   Lisa Roca, MD    Allergies  Allergen Reactions  . Bidil [Isosorb Dinitrate-Hydralazine] Other (See Comments)    Migraine Headache Migraine Headache    Family History  Problem Relation Age of Onset  . Hypertension Mother   . Congestive Heart Failure Mother   . Hypertension Sister   .  Diabetes Sister   . Pancreatitis Sister   . COPD Sister   . Pancreatitis Brother   . Anemia Neg Hx   . Arrhythmia Neg Hx   . Asthma Neg Hx   . Clotting disorder Neg Hx   . Fainting Neg Hx   . Heart attack Neg Hx   . Heart disease Neg Hx   . Heart failure Neg Hx   . Hyperlipidemia Neg Hx     Social History Social History   Tobacco Use  . Smoking status: Current Every Day Smoker    Packs/day: 0.50    Years: 24.00    Pack years: 12.00    Types: Cigarettes  . Smokeless tobacco: Never Used  . Tobacco comment: 4/5 Smoking 5 cigs a day  Substance Use Topics  . Alcohol use: No  . Drug use: No    Review of Systems  Constitutional: Negative for fever. Eyes: Negative for visual changes. ENT: Negative for sore throat. Cardiovascular: Negative for chest  pain. Respiratory: Negative for shortness of breath. Gastrointestinal: Negative for vomiting and diarrhea. Genitourinary: Positive for hematuria yesterday, positive for dysuria and feeling of "pinching". Musculoskeletal: Positive for low and right-sided back pain, intermittent without any traumatic or overuse history. Skin: Negative for rash. Neurological: Negative for headache.  ____________________________________________   PHYSICAL EXAM:  VITAL SIGNS: ED Triage Vitals  Enc Vitals Group     BP 08/20/17 1105 111/74     Pulse Rate 08/20/17 1105 92     Resp 08/20/17 1105 18     Temp 08/20/17 1105 97.6 F (36.4 C)     Temp Source 08/20/17 1105 Oral     SpO2 08/20/17 1105 96 %     Weight 08/20/17 1106 (!) 320 lb (145.2 kg)     Height 08/20/17 1106 5' 6"  (1.676 m)     Head Circumference --      Peak Flow --      Pain Score 08/20/17 1105 4     Pain Loc --      Pain Edu? --      Excl. in [place]? --      Constitutional: Alert and oriented. Well appearing and in no distress. HEENT   Head: Normocephalic and atraumatic.      Eyes: Conjunctivae are normal. Pupils equal and round.       Ears:         Nose: No  congestion/rhinnorhea.   Mouth/Throat: Mucous membranes are moist.   Neck: No stridor. Cardiovascular/Chest: Normal rate, regular rhythm.  No murmurs, rubs, or gallops. Respiratory: Normal respiratory effort without tachypnea nor retractions. Breath sounds are clear and equal bilaterally. No wheezes/rales/rhonchi. Gastrointestinal: Soft. No distention, no guarding, no rebound. Nontender.  Obese Genitourinary/rectal:Deferred Musculoskeletal: Mild right flank discomfort to palpation, no midline lumbar spine tenderness.  Nontender with normal range of motion in all extremities. No joint effusions.  No lower extremity tenderness.  No edema. Neurologic:  Normal speech and language. No gross or focal neurologic deficits are appreciated. Skin:  Skin is warm, dry and intact. No rash noted. Psychiatric: Mood and affect are normal. Speech and behavior are normal. Patient exhibits appropriate insight and judgment.   ____________________________________________  LABS (pertinent positives/negatives) I, Lisa Roca, MD the attending physician have reviewed the labs noted below.  Labs Reviewed  URINALYSIS, COMPLETE (UACMP) WITH MICROSCOPIC - Abnormal; Notable for the following components:      Result Value   Color, Urine YELLOW (*)    APPearance CLEAR (*)    Hgb urine dipstick SMALL (*)    Protein, ur 100 (*)    Squamous Epithelial / LPF 6-30 (*)    All other components within normal limits  BASIC METABOLIC PANEL - Abnormal; Notable for the following components:   Sodium 134 (*)    Glucose, Bld 179 (*)    BUN 43 (*)    Creatinine, Ser 1.59 (*)    Calcium 8.8 (*)    GFR calc non Af Amer 52 (*)    All other components within normal limits  URINE CULTURE  CBC    ____________________________________________    EKG I, Lisa Roca, MD, the attending physician have personally viewed and interpreted all ECGs.  None ____________________________________________  RADIOLOGY All Xrays  were viewed by me.  Imaging interpreted by Radiologist, and I, Lisa Roca, MD the attending physician have reviewed the radiologist interpretation noted below.  None __________________________________________  PROCEDURES  Procedure(s) performed: None  Critical Care performed: None   ____________________________________________  ED COURSE /  ASSESSMENT AND PLAN  Pertinent labs & imaging results that were available during my care of the patient were reviewed by me and considered in my medical decision making (see chart for details).    Patient believes he is urinary tract infection, his urinalysis is only positive for white blood cells, but he did say that he saw gross hematuria yesterday.  He had some intermittent low back and right-sided flank pain yesterday.  Some of the description of discomfort sound like it may be related to kidney stone.  We discussed whether to CT for confirmation, or treat presumptively.  Patient would like to hold off on CT I think this is reasonable.  His creatinine is around 1 at baseline is a little elevated here, he is going to drink plenty fluids.  We discussed about whether or not to place him on antibiotics for possible urinary tract infection, versus wait for the culture.  Patient really would like to go ahead and start antibiotics.  Started him on Keflex.  DIFFERENTIAL DIAGNOSIS: Including but not limited to urinary tract infection, hematuria, radiculopathy, musculoskeletal low back pain, idiopathic hematuria, kidney stone, etc.  CONSULTATIONS:   None   Patient / Family / Caregiver informed of clinical course, medical decision-making process, and agree with plan.   I discussed return precautions, follow-up instructions, and discharge instructions with patient and/or family.  Discharge Instructions : You are evaluated for blood in the urine yesterday although your urine sample today does not show blood in it, there are some white blood cells  which could be related to inflammation or infection.  We discussed whether or not to wait for a urine culture or go ahead and treat upfront for what feels like a urinary tract infection to you, and chose to proceed to go ahead and treat with antibiotic called Keflex.  Your kidney function was slightly decreased, which may be related to dehydration.  Make sure you take plenty of fluids.  We discussed the possibility of kidney stone with the blood in the urine yesterday and the right sided flank pain which is intermittent, and decided to hold off on CT scan at this point time, but if you have pain that is ongoing after 1 week, you would need further evaluation and possibly CT scan for further investigation.  Return to the emergency room immediately for any worsening condition including new or uncontrolled pain, weakness, numbness, black or bloody stool, fever, or any other symptoms concerning to you.    ___________________________________________   FINAL CLINICAL IMPRESSION(S) / ED DIAGNOSES   Final diagnoses:  Acute renal failure, unspecified acute renal failure type ([place])  Right flank pain  Dysuria      ___________________________________________        Note: This dictation was prepared with Dragon dictation. Any transcriptional errors that result from this process are unintentional    Lisa Roca, MD 08/20/17 [phone]

## 2017-08-20 NOTE — ED Triage Notes (Signed)
Pt arrives to ED with c/o of blood in urine, low back pain. States he thinks he has a kidney infection. Hx pancreatitis. Pt is alert, oriented, ambulatory. No distress noted in triage.

## 2017-08-21 LAB — URINE CULTURE: Culture: NO GROWTH

## 2017-09-28 ENCOUNTER — Ambulatory Visit: Payer: Medicaid Other | Admitting: Podiatry

## 2017-09-28 ENCOUNTER — Encounter: Payer: Self-pay | Admitting: Podiatry

## 2017-09-28 DIAGNOSIS — B351 Tinea unguium: Secondary | ICD-10-CM | POA: Diagnosis not present

## 2017-09-28 DIAGNOSIS — E119 Type 2 diabetes mellitus without complications: Secondary | ICD-10-CM

## 2017-09-28 DIAGNOSIS — M79676 Pain in unspecified toe(s): Secondary | ICD-10-CM | POA: Diagnosis not present

## 2017-09-28 NOTE — Progress Notes (Signed)
Complaint:  Visit Type: Patient returns to my office for continued preventative foot care services. Complaint: Patient states" my nails have grown long and thick and become painful to walk and wear shoes" Patient has been diagnosed with DM with no foot complications. The patient presents for preventative foot care services. No changes to ROS  Podiatric Exam: Vascular: dorsalis pedis and posterior tibial pulses are palpable bilateral. Capillary return is immediate. Temperature gradient is WNL. Skin turgor WNL  Sensorium: Normal Semmes Weinstein monofilament test. Normal tactile sensation bilaterally. Nail Exam: Pt has thick disfigured discolored nails with subungual debris noted bilateral entire nail hallux through fifth toenails Ulcer Exam: There is no evidence of ulcer or pre-ulcerative changes or infection. Orthopedic Exam: Muscle tone and strength are WNL. No limitations in general ROM. No crepitus or effusions noted. Foot type and digits show no abnormalities. Bony prominences are unremarkable. Skin: No Porokeratosis. No infection or ulcers  Diagnosis:  Onychomycosis, , Pain in right toe, pain in left toes  Treatment & Plan Procedures and Treatment: Consent by patient was obtained for treatment procedures. The patient understood the discussion of treatment and procedures well. All questions were answered thoroughly reviewed. Debridement of mycotic and hypertrophic toenails, 1 through 5 bilateral and clearing of subungual debris. No ulceration, no infection noted. Medicaid  ABN signed for 2019. Return Visit-Office Procedure: Patient instructed to return to the office for a follow up visit 3 months for continued evaluation and treatment.    [name] DPM

## 2017-10-05 ENCOUNTER — Other Ambulatory Visit: Payer: Self-pay | Admitting: Student

## 2017-10-05 DIAGNOSIS — K859 Acute pancreatitis without necrosis or infection, unspecified: Secondary | ICD-10-CM

## 2017-10-05 DIAGNOSIS — R1013 Epigastric pain: Secondary | ICD-10-CM

## 2017-10-16 ENCOUNTER — Other Ambulatory Visit: Payer: Self-pay | Admitting: Student

## 2017-10-16 DIAGNOSIS — K861 Other chronic pancreatitis: Secondary | ICD-10-CM

## 2017-10-26 ENCOUNTER — Ambulatory Visit
Admission: RE | Admit: 2017-10-26 | Payer: Medicaid Other | Source: Ambulatory Visit | Admitting: Unknown Physician Specialty

## 2017-10-26 ENCOUNTER — Encounter: Admission: RE | Payer: Self-pay | Source: Ambulatory Visit

## 2017-10-26 SURGERY — ESOPHAGOGASTRODUODENOSCOPY (EGD) WITH PROPOFOL
Anesthesia: General

## 2017-10-27 ENCOUNTER — Ambulatory Visit
Admission: RE | Admit: 2017-10-27 | Discharge: 2017-10-27 | Disposition: A | Discharge status: Home / Self Care | Payer: Medicaid Other | Source: Ambulatory Visit | Attending: Student | Admitting: Student

## 2017-10-29 ENCOUNTER — Other Ambulatory Visit: Payer: Medicaid Other

## 2017-10-30 ENCOUNTER — Ambulatory Visit
Admission: RE | Admit: 2017-10-30 | Discharge: 2017-10-30 | Disposition: A | Discharge status: Home / Self Care | Payer: Medicaid Other | Source: Ambulatory Visit | Attending: Student | Admitting: Student

## 2017-10-30 NOTE — Progress Notes (Signed)
Patient ID: Oscar [name], male    DOB: 1975-10-18, 42 y.o.   MRN: [id number]  HPI  Oscar [name] is a 42 y/o male with a history of obstructive sleep apnea (currently with CPAP), MI, HTN, hyperlipidemia, GERD, DM, SVT, asthma and chronic heart failure.  Last echo was done 02/06/15 which showed an EF of 25% which is unchanged from previous echo on 12/10/14 which showed an EF of 25% and mild Oscar.   Was in the ED 08/20/17 due to hematuria. Treated with antibiotics for UTI and released. Was in the ED 08/13/17 due to epigastric pain. Abdominal CT shows pancreatitis. Released that day. Was in the ED 08/05/17 due to pancreatitis where he was treated and released.   He presents today for a follow-up visit with a chief complaint of minimal fatigue upon moderate exertion. He describes this as chronic in nature having been present for years although he does feel like his energy level has improved. He has associated wheezing and abdominal pain along with this. He denies any chest pain, shortness of breath, edema, palpitations, abdominal distention, dizziness, difficulty sleeping or weight gain. Scheduled for an abdominal CT on 11/10/17 due to his chronic pancreatitis.   Past Medical History:  Diagnosis Date  . Asthma   . Cardiomyopathy ([place])   . CHF (congestive heart failure) ([place])   . Coronary artery disease   . Deafness in right ear   . Diabetes mellitus without complication ([place])   . Dysrhythmia    svt  . 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 GERD (gastroesophageal reflux disease)   . Hyperlipidemia   . Hypertension   . Hypoxemia   . Myocardial infarction ([place])    [id number],1/63  . Pancreatitis   . Sleep apnea    osa   Past Surgical History:  Procedure Laterality Date  . CARDIAC CATHETERIZATION  12/11/2014   Procedure: RIGHT/LEFT HEART CATH AND CORONARY ANGIOGRAPHY;  Surgeon: Lorretta Harp, MD;  Location: [facility] CATH LAB;  Service: Cardiovascular;;  . ICD LEAD REMOVAL N/A 03/30/2015   Procedure: ICD LEAD REMOVAL;  Surgeon: Marzetta Board, MD;  Location: [facility] ORS;  Service: Cardiovascular;  Laterality: N/A;  . IMPLANTABLE CARDIOVERTER DEFIBRILLATOR IMPLANT    . INSERT / REPLACE / REMOVE PACEMAKER    . LEFT HEART CATHETERIZATION WITH CORONARY ANGIOGRAM N/A 12/09/2014   Procedure: LEFT HEART CATHETERIZATION WITH CORONARY ANGIOGRAM;  Surgeon: Burnell Blanks, MD;  Location: [facility] CATH LAB;  Service: Cardiovascular;  Laterality: N/A;   Family History  Problem Relation Age of Onset  . Hypertension Mother   . Congestive Heart Failure Mother   . Hypertension Sister   . Diabetes Sister   . Pancreatitis Sister   . COPD Sister   . Pancreatitis Brother   . Anemia Neg Hx   . Arrhythmia Neg Hx   . Asthma Neg Hx   . Clotting disorder Neg Hx   . Fainting Neg Hx   . Heart attack Neg Hx   . Heart disease Neg Hx   . Heart failure Neg Hx   . Hyperlipidemia Neg Hx    Social History   Tobacco Use  . Smoking status: Current Every Day Smoker    Packs/day: 0.50    Years: 24.00    Pack years: 12.00    Types: Cigarettes  . Smokeless tobacco: Never Used  . Tobacco comment: 4/5 Smoking 5 cigs a day  Substance Use Topics  . Alcohol use: No  Allergies  Allergen Reactions  . Bidil [Isosorb Dinitrate-Hydralazine] Other (See Comments)    Migraine Headache Migraine Headache   Prior to Admission medications   Medication Sig Start Date End Date Taking? Authorizing Provider  aspirin EC 325 MG tablet Take 1 tablet (325 mg total) by mouth daily. 07/10/15  Yes Aldean Jewett, MD  atorvastatin (LIPITOR) 40 MG tablet Take 1 tablet (40 mg total) by mouth daily at 6 PM. 09/13/16  Yes Gouru, Aruna, MD  ENTRESTO 49-51 MG TAKE 1 TABLET BY MOUTH TWO TIMES DAILY 06/17/17  Yes Darylene Price A, FNP  insulin glargine (LANTUS) 100 UNIT/ML injection Inject 0.3 mLs (30 Units total) into the skin at bedtime. Patient taking differently: Inject 30 Units into the skin at bedtime. Takes 40 units twice  daily 05/20/17  Yes Epifanio Lesches, MD  metFORMIN (GLUCOPHAGE) 1000 MG tablet Take 1,000 mg by mouth 2 (two) times daily with a meal.   Yes [provider]  metoprolol succinate (TOPROL-XL) 25 MG 24 hr tablet TAKE ONE TABLET BY MOUTH DAILY 07/10/17  Yes Darylene Price A, FNP  omeprazole (PRILOSEC) 20 MG capsule Take 1 capsule (20 mg total) by mouth daily. 04/01/16  Yes McGowan, Larene Beach A, PA-C  polyethylene glycol (MIRALAX / GLYCOLAX) packet Take 17 g by mouth daily. Takes as needed   Yes [provider]  spironolactone (ALDACTONE) 25 MG tablet Take 1 tablet (25 mg total) by mouth daily. 09/19/16 01/29/18 Yes Revis Whalin, Otila Kluver A, FNP  Syringe, Disposable, 1 ML MISC 0.1 mLs by Does not apply route as directed. 08/21/15  Yes Darylene Price A, FNP  tamsulosin (FLOMAX) 0.4 MG CAPS capsule Take 1 capsule (0.4 mg total) by mouth daily. 08/20/17  Yes Lisa Roca, MD  tiotropium (SPIRIVA) 18 MCG inhalation capsule Place 18 mcg into inhaler and inhale daily.   Yes [provider]  torsemide (DEMADEX) 20 MG tablet Take 20 mg by mouth daily.    Yes [provider]  insulin starter kit- syringes MISC 1 kit by Other route once. 07/10/15   Aldean Jewett, MD  ondansetron (ZOFRAN ODT) 8 MG disintegrating tablet Take by mouth. 01/29/17   [provider]  oxyCODONE (ROXICODONE) 5 MG immediate release tablet Take 1 tablet (5 mg total) by mouth every 4 (four) hours as needed for severe pain. Patient not taking: Reported on 08/20/2017 08/13/17 08/13/18  Eula Listen, MD  oxyCODONE-acetaminophen (PERCOCET/ROXICET) 5-325 MG tablet Take by mouth. 08/05/17   [provider]    Review of Systems  Constitutional: Positive for fatigue. Negative for appetite change.  HENT: Negative for congestion, postnasal drip and sore throat.   Eyes: Negative.   Respiratory: Positive for wheezing. Negative for chest tightness and shortness of breath.   Cardiovascular: Negative  for chest pain, palpitations and leg swelling.  Gastrointestinal: Positive for abdominal pain (at times). Negative for abdominal distention and diarrhea.  Endocrine: Negative.   Genitourinary: Negative.   Musculoskeletal: Negative for back pain.  Skin: Negative.   Allergic/Immunologic: Negative.   Neurological: Negative for dizziness and light-headedness.  Hematological: Negative for adenopathy. Does not bruise/bleed easily.  Psychiatric/Behavioral: Negative for dysphoric mood, sleep disturbance (sleeping on 4 pillows) and suicidal ideas. The patient is not nervous/anxious.    Vitals:   11/02/17 0947  BP: 134/84  Pulse: 73  Resp: 18  SpO2: 98%  Weight: (!) 318 lb 2 oz (144.3 kg)  Height: 5' 6"  (1.676 m)   Wt Readings from Last 3 Encounters:  11/02/17 (!) 318  lb 2 oz (144.3 kg)  08/20/17 (!) 320 lb (145.2 kg)  08/13/17 (!) 319 lb (144.7 kg)   Lab Results  Component Value Date   CREATININE 1.59 (H) 08/20/2017   CREATININE 1.10 08/13/2017   CREATININE 1.04 08/05/2017   Physical Exam  Constitutional: He is oriented to person, place, and time. He appears well-developed and well-nourished.  HENT:  Head: Normocephalic and atraumatic.  Neck: Normal range of motion. Neck supple. No JVD present.  Cardiovascular: Normal rate and regular rhythm.  Pulmonary/Chest: Effort normal. He has no wheezes. He has no rales.  Abdominal: Soft. He exhibits no distension. There is tenderness.  Musculoskeletal: He exhibits no edema or tenderness.  Neurological: He is alert and oriented to person, place, and time.  Skin: Skin is warm and dry.  Psychiatric: He has a normal mood and affect. His behavior is normal. Thought content normal.  Nursing note and vitals reviewed.    Assessment & Plan:  1: Chronic heart failure with reduced ejection fraction- - NYHA class II - euvolemic today - weight gradually declining at home due to pancreatitis. Reminded to call for an overnight weight gain of >2  pounds or a weekly weight gain of >5 pounds.  - weight down 13.4 pounds since 08/04/17 - tolerating toprol XL without known side effects; consider titrating up - last saw cardiology [name]) 10/08/17 - bidil gave him migraine headache - [facility] comes and fills out his pill box for him - does not meet ReDS vest criteria due to BMI - BNP on 05/18/17 was 156.0 - PharmD reconciled medications with the patient - he wants to hold off on echocardiogram until his GI issues get straightened out  2: HTN- - BP looks good today - BMP from 08/20/17 reviewed and shows sodium 134, potassium 4.2 & GFR >60  3: Obstructive sleep apnea- - wearing his CPAP inconsistently now as he recently had some ear pain/ear infection and the CPAP made it worse - encouraged him to resume wearing it - continues wearing oxygen at 3L   4: Diabetes- - glucose at home was 273 fasting - lantus is being titrated up by PCP - encouraged him to follow closely with his PCP or ask about a referral to an endocrinologist - A1c from 09/12/16 was 10.9%  5: Tobacco use- - continues to smoke 4 cigarettes daily - does remove himself from the oxygen when smoking - complete cessation discussed for 3 minutes with him  6: Pancreatitis- - scheduled for an abdominal CT next week - says when it flares, that his stomach hurts him so much that he can't really eat and can only keep down jello, freezer pops etc - denies any alcohol use - saw GI provider Wynetta Emery) 10/05/17  Patient did not bring his medications nor a list. Each medication was verbally reviewed with the patient and he was encouraged to bring the bottles to every visit to confirm accuracy of list.  Return in 6 months or sooner for any questions/problems before then.

## 2017-11-02 ENCOUNTER — Encounter: Payer: Self-pay | Admitting: Family

## 2017-11-02 ENCOUNTER — Ambulatory Visit: Payer: Medicaid Other | Attending: Family | Admitting: Family

## 2017-11-02 VITALS — BP 134/84 | HR 73 | Resp 18 | Ht 66.0 in | Wt 318.1 lb

## 2017-11-02 DIAGNOSIS — Z7982 Long term (current) use of aspirin: Secondary | ICD-10-CM | POA: Diagnosis not present

## 2017-11-02 DIAGNOSIS — K219 Gastro-esophageal reflux disease without esophagitis: Secondary | ICD-10-CM | POA: Diagnosis not present

## 2017-11-02 DIAGNOSIS — I252 Old myocardial infarction: Secondary | ICD-10-CM | POA: Diagnosis not present

## 2017-11-02 DIAGNOSIS — Z950 Presence of cardiac pacemaker: Secondary | ICD-10-CM | POA: Insufficient documentation

## 2017-11-02 DIAGNOSIS — K859 Acute pancreatitis without necrosis or infection, unspecified: Secondary | ICD-10-CM | POA: Insufficient documentation

## 2017-11-02 DIAGNOSIS — Z794 Long term (current) use of insulin: Secondary | ICD-10-CM | POA: Insufficient documentation

## 2017-11-02 DIAGNOSIS — Z888 Allergy status to other drugs, medicaments and biological substances status: Secondary | ICD-10-CM | POA: Insufficient documentation

## 2017-11-02 DIAGNOSIS — I5022 Chronic systolic (congestive) heart failure: Secondary | ICD-10-CM

## 2017-11-02 DIAGNOSIS — E785 Hyperlipidemia, unspecified: Secondary | ICD-10-CM | POA: Diagnosis not present

## 2017-11-02 DIAGNOSIS — Z825 Family history of asthma and other chronic lower respiratory diseases: Secondary | ICD-10-CM | POA: Insufficient documentation

## 2017-11-02 DIAGNOSIS — J45909 Unspecified asthma, uncomplicated: Secondary | ICD-10-CM | POA: Insufficient documentation

## 2017-11-02 DIAGNOSIS — F17210 Nicotine dependence, cigarettes, uncomplicated: Secondary | ICD-10-CM | POA: Diagnosis not present

## 2017-11-02 DIAGNOSIS — I110 Hypertensive heart disease with heart failure: Secondary | ICD-10-CM | POA: Insufficient documentation

## 2017-11-02 DIAGNOSIS — Z79891 Long term (current) use of opiate analgesic: Secondary | ICD-10-CM | POA: Insufficient documentation

## 2017-11-02 DIAGNOSIS — Z9889 Other specified postprocedural states: Secondary | ICD-10-CM | POA: Insufficient documentation

## 2017-11-02 DIAGNOSIS — G4733 Obstructive sleep apnea (adult) (pediatric): Secondary | ICD-10-CM | POA: Insufficient documentation

## 2017-11-02 DIAGNOSIS — I471 Supraventricular tachycardia: Secondary | ICD-10-CM | POA: Insufficient documentation

## 2017-11-02 DIAGNOSIS — Z8249 Family history of ischemic heart disease and other diseases of the circulatory system: Secondary | ICD-10-CM | POA: Diagnosis not present

## 2017-11-02 DIAGNOSIS — Z720 Tobacco use: Secondary | ICD-10-CM

## 2017-11-02 DIAGNOSIS — I219 Acute myocardial infarction, unspecified: Secondary | ICD-10-CM | POA: Insufficient documentation

## 2017-11-02 DIAGNOSIS — G43909 Migraine, unspecified, not intractable, without status migrainosus: Secondary | ICD-10-CM | POA: Diagnosis not present

## 2017-11-02 DIAGNOSIS — E119 Type 2 diabetes mellitus without complications: Secondary | ICD-10-CM | POA: Diagnosis not present

## 2017-11-02 DIAGNOSIS — Z79899 Other long term (current) drug therapy: Secondary | ICD-10-CM | POA: Diagnosis not present

## 2017-11-02 DIAGNOSIS — I429 Cardiomyopathy, unspecified: Secondary | ICD-10-CM | POA: Diagnosis not present

## 2017-11-02 DIAGNOSIS — I2510 Atherosclerotic heart disease of native coronary artery without angina pectoris: Secondary | ICD-10-CM | POA: Insufficient documentation

## 2017-11-02 DIAGNOSIS — I10 Essential (primary) hypertension: Secondary | ICD-10-CM

## 2017-11-02 DIAGNOSIS — K861 Other chronic pancreatitis: Secondary | ICD-10-CM

## 2017-11-02 DIAGNOSIS — Z833 Family history of diabetes mellitus: Secondary | ICD-10-CM | POA: Diagnosis not present

## 2017-11-02 DIAGNOSIS — Z87440 Personal history of urinary (tract) infections: Secondary | ICD-10-CM | POA: Insufficient documentation

## 2017-11-02 NOTE — Patient Instructions (Signed)
Continue weighing daily and call for an overnight weight gain of > 2 pounds or a weekly weight gain of >5 pounds. 

## 2017-11-10 ENCOUNTER — Ambulatory Visit
Admission: RE | Admit: 2017-11-10 | Discharge: 2017-11-10 | Disposition: A | Discharge status: Home / Self Care | Payer: Medicaid Other | Source: Ambulatory Visit | Attending: Student | Admitting: Student

## 2017-11-10 DIAGNOSIS — K429 Umbilical hernia without obstruction or gangrene: Secondary | ICD-10-CM | POA: Insufficient documentation

## 2017-11-10 DIAGNOSIS — K861 Other chronic pancreatitis: Secondary | ICD-10-CM | POA: Diagnosis present

## 2017-11-10 MED ORDER — IOPAMIDOL (ISOVUE-370) INJECTION 76%
100.0000 mL | Freq: Once | INTRAVENOUS | Status: AC | PRN
  Administered 2017-11-10: 100 mL via INTRAVENOUS

## 2017-12-18 ENCOUNTER — Encounter: Payer: Self-pay | Admitting: *Deleted

## 2017-12-21 ENCOUNTER — Encounter: Admission: RE | Payer: Self-pay | Source: Ambulatory Visit

## 2017-12-21 ENCOUNTER — Ambulatory Visit
Admission: RE | Admit: 2017-12-21 | Payer: Medicaid Other | Source: Ambulatory Visit | Admitting: Unknown Physician Specialty

## 2017-12-21 HISTORY — DX: Dilated cardiomyopathy: I42.0

## 2017-12-21 HISTORY — DX: Presence of automatic (implantable) cardiac defibrillator: Z95.810

## 2017-12-21 HISTORY — DX: Obesity, unspecified: E66.9

## 2017-12-21 SURGERY — ESOPHAGOGASTRODUODENOSCOPY (EGD) WITH PROPOFOL
Anesthesia: General

## 2017-12-28 ENCOUNTER — Ambulatory Visit: Payer: Medicaid Other | Admitting: Podiatry

## 2017-12-31 ENCOUNTER — Ambulatory Visit (INDEPENDENT_AMBULATORY_CARE_PROVIDER_SITE_OTHER): Payer: Medicare Other | Admitting: Podiatry

## 2017-12-31 ENCOUNTER — Encounter: Payer: Self-pay | Admitting: Podiatry

## 2017-12-31 DIAGNOSIS — B351 Tinea unguium: Secondary | ICD-10-CM | POA: Diagnosis not present

## 2017-12-31 DIAGNOSIS — E119 Type 2 diabetes mellitus without complications: Secondary | ICD-10-CM

## 2017-12-31 DIAGNOSIS — M79676 Pain in unspecified toe(s): Secondary | ICD-10-CM | POA: Diagnosis not present

## 2017-12-31 NOTE — Progress Notes (Signed)
Complaint:  Visit Type: Patient returns to my office for continued preventative foot care services. Complaint: Patient states" my nails have grown long and thick and become painful to walk and wear shoes" Patient has been diagnosed with DM with no foot complications. The patient presents for preventative foot care services. No changes to ROS  Podiatric Exam: Vascular: dorsalis pedis and posterior tibial pulses are palpable bilateral. Capillary return is immediate. Temperature gradient is WNL. Skin turgor WNL  Sensorium: Normal Semmes Weinstein monofilament test. Normal tactile sensation bilaterally. Nail Exam: Pt has thick disfigured discolored nails with subungual debris noted bilateral entire nail hallux through fifth toenails Ulcer Exam: There is no evidence of ulcer or pre-ulcerative changes or infection. Orthopedic Exam: Muscle tone and strength are WNL. No limitations in general ROM. No crepitus or effusions noted. Foot type and digits show no abnormalities. Bony prominences are unremarkable. Skin: No Porokeratosis. No infection or ulcers.  Heel calus  B/L.  Diagnosis:  Onychomycosis, , Pain in right toe, pain in left toes  Treatment & Plan Procedures and Treatment: Consent by patient was obtained for treatment procedures. The patient understood the discussion of treatment and procedures well. All questions were answered thoroughly reviewed. Debridement of mycotic and hypertrophic toenails, 1 through 5 bilateral and clearing of subungual debris. No ulceration, no infection noted. Medicaid  ABN signed for 2019. Told to use vaseline for feet. Return Visit-Office Procedure: Patient instructed to return to the office for a follow up visit 3 months for continued evaluation and treatment.    [name] DPM

## 2018-02-04 ENCOUNTER — Emergency Department
Admission: EM | Admit: 2018-02-04 | Discharge: 2018-02-04 | Disposition: A | Discharge status: Home / Self Care | Payer: Medicare Other | Attending: Emergency Medicine | Admitting: Emergency Medicine

## 2018-02-04 ENCOUNTER — Encounter: Payer: Self-pay | Admitting: Medical Oncology

## 2018-02-04 DIAGNOSIS — R1010 Upper abdominal pain, unspecified: Secondary | ICD-10-CM

## 2018-02-04 DIAGNOSIS — H6501 Acute serous otitis media, right ear: Secondary | ICD-10-CM | POA: Insufficient documentation

## 2018-02-04 DIAGNOSIS — R531 Weakness: Secondary | ICD-10-CM | POA: Diagnosis not present

## 2018-02-04 DIAGNOSIS — I252 Old myocardial infarction: Secondary | ICD-10-CM | POA: Insufficient documentation

## 2018-02-04 DIAGNOSIS — J45909 Unspecified asthma, uncomplicated: Secondary | ICD-10-CM | POA: Diagnosis not present

## 2018-02-04 DIAGNOSIS — F17210 Nicotine dependence, cigarettes, uncomplicated: Secondary | ICD-10-CM | POA: Insufficient documentation

## 2018-02-04 DIAGNOSIS — I110 Hypertensive heart disease with heart failure: Secondary | ICD-10-CM | POA: Diagnosis not present

## 2018-02-04 DIAGNOSIS — H9201 Otalgia, right ear: Secondary | ICD-10-CM | POA: Diagnosis not present

## 2018-02-04 DIAGNOSIS — I5022 Chronic systolic (congestive) heart failure: Secondary | ICD-10-CM | POA: Insufficient documentation

## 2018-02-04 DIAGNOSIS — Z950 Presence of cardiac pacemaker: Secondary | ICD-10-CM | POA: Diagnosis not present

## 2018-02-04 DIAGNOSIS — E119 Type 2 diabetes mellitus without complications: Secondary | ICD-10-CM | POA: Diagnosis not present

## 2018-02-04 LAB — COMPREHENSIVE METABOLIC PANEL
ALBUMIN: 4.1 g/dL (ref 3.5–5.0)
ALK PHOS: 230 U/L — AB (ref 38–126)
ALT: 39 U/L (ref 17–63)
AST: 45 U/L — AB (ref 15–41)
Anion gap: 14 (ref 5–15)
BILIRUBIN TOTAL: 0.6 mg/dL (ref 0.3–1.2)
BUN: 22 mg/dL — AB (ref 6–20)
CO2: 23 mmol/L (ref 22–32)
Calcium: 8.7 mg/dL — ABNORMAL LOW (ref 8.9–10.3)
Chloride: 100 mmol/L — ABNORMAL LOW (ref 101–111)
Creatinine, Ser: 1.13 mg/dL (ref 0.61–1.24)
GFR calc Af Amer: >60 mL/min (ref >60)
GFR calc non Af Amer: >60 mL/min (ref >60)
GLUCOSE: 173 mg/dL — AB (ref 65–99)
POTASSIUM: 3.4 mmol/L — AB (ref 3.5–5.1)
SODIUM: 137 mmol/L (ref 135–145)
TOTAL PROTEIN: 8.6 g/dL — AB (ref 6.5–8.1)

## 2018-02-04 LAB — URINALYSIS, COMPLETE (UACMP) WITH MICROSCOPIC
BACTERIA UA: NONE SEEN
BILIRUBIN URINE: NEGATIVE
GLUCOSE, UA: NEGATIVE mg/dL
HGB URINE DIPSTICK: NEGATIVE
KETONES UR: NEGATIVE mg/dL
LEUKOCYTES UA: NEGATIVE
Nitrite: NEGATIVE
PROTEIN: 30 mg/dL — AB
Specific Gravity, Urine: 1.009 (ref 1.005–1.030)
pH: 5 (ref 5.0–8.0)

## 2018-02-04 LAB — CBC WITH DIFFERENTIAL/PLATELET
BASOS ABS: 0 10*3/uL (ref 0–0.1)
BASOS PCT: 1 %
EOS ABS: 0.2 10*3/uL (ref 0–0.7)
Eosinophils Relative: 3 %
HCT: 47.2 % (ref 40.0–52.0)
HEMOGLOBIN: 16.6 g/dL (ref 13.0–18.0)
Lymphocytes Relative: 46 %
Lymphs Abs: 2.4 10*3/uL (ref 1.0–3.6)
MCH: 33.2 pg (ref 26.0–34.0)
MCHC: 35.2 g/dL (ref 32.0–36.0)
MCV: 94.2 fL (ref 80.0–100.0)
MONOS PCT: 6 %
Monocytes Absolute: 0.3 10*3/uL (ref 0.2–1.0)
NEUTROS ABS: 2.3 10*3/uL (ref 1.4–6.5)
Neutrophils Relative %: 44 %
Platelets: 195 10*3/uL (ref 150–440)
RBC: 5.01 MIL/uL (ref 4.40–5.90)
RDW: 12.8 % (ref 11.5–14.5)
WBC: 5.2 10*3/uL (ref 3.8–10.6)

## 2018-02-04 LAB — LIPASE, BLOOD: Lipase: 50 U/L (ref 11–51)

## 2018-02-04 LAB — TROPONIN I: Troponin I: 0.07 ng/mL (ref <0.03)

## 2018-02-04 MED ORDER — SODIUM CHLORIDE 0.9 % IV BOLUS
250.0000 mL | Freq: Once | INTRAVENOUS | Status: AC
  Administered 2018-02-04: 250 mL via INTRAVENOUS

## 2018-02-04 MED ORDER — ONDANSETRON 4 MG PO TBDP: 4.0000 mg | ORAL_TABLET | Freq: Three times a day (TID) | ORAL | 0 refills | Status: DC | PRN

## 2018-02-04 MED ORDER — MORPHINE SULFATE (PF) 4 MG/ML IV SOLN
4.0000 mg | Freq: Once | INTRAVENOUS | Status: AC
  Administered 2018-02-04: 4 mg via INTRAVENOUS
  Filled 2018-02-04: qty 1

## 2018-02-04 MED ORDER — OXYCODONE-ACETAMINOPHEN 5-325 MG PO TABS: 1.0000 | ORAL_TABLET | Freq: Three times a day (TID) | ORAL | 0 refills | Status: DC | PRN

## 2018-02-04 MED ORDER — CIPROFLOXACIN-DEXAMETHASONE 0.3-0.1 % OT SUSP: 4.0000 [drp] | Freq: Two times a day (BID) | OTIC | 0 refills | Status: DC

## 2018-02-04 MED ORDER — ONDANSETRON HCL 4 MG/2ML IJ SOLN
4.0000 mg | Freq: Once | INTRAMUSCULAR | Status: AC
  Administered 2018-02-04: 4 mg via INTRAVENOUS
  Filled 2018-02-04: qty 2

## 2018-02-04 NOTE — ED Notes (Signed)
Cup of water provided to pt. 

## 2018-02-04 NOTE — ED Provider Notes (Signed)
[facility] Emergency Department Provider Note       Time seen: ----------------------------------------- 9:48 AM on 02/04/2018 -----------------------------------------   I have reviewed the triage vital signs and the nursing notes.  HISTORY   Chief Complaint Abdominal Pain; Emesis; and Otalgia    HPI Oscar [name] is a 42 y.o. male with a history of asthma, cardiomyopathy, hyperlipidemia, hypertension, MI who presents to the ED for multiple complaints, mainly abdominal pain for the past 2 weeks.  Patient states whenever he eats or drinks anything he has upper abdominal pain.  He did have a history of chronic pancreatitis in the past.  He is also complained of right ear pain over the last week.  He denies fevers or chills, denies chest pain or difficulty breathing.  He does describe some weakness.  Past Medical History:  Diagnosis Date  . AICD (automatic cardioverter/defibrillator) present   . Asthma   . Cardiomyopathy ([place])   . CHF (congestive heart failure) ([place])   . Coronary artery disease   . Deafness in right ear   . Diabetes mellitus without complication ([place])   . Dilated cardiomyopathy ([place])   . Dysrhythmia    svt  . 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 GERD (gastroesophageal reflux disease)   . Hyperlipidemia   . Hypertension   . Hypoxemia   . Hypoxemia   . Mild obesity   . Myocardial infarction ([place])    [id number],4/40  . Pancreatitis   . Sleep apnea    osa    Patient Active Problem List   Diagnosis Date Noted  . Acute pancreatitis 05/19/2017  . Pancreatitis 09/12/2016  . Bronchitis 05/20/2016  . Constipation 11/08/2015  . Abdominal pain 11/02/2015  . Obstructive sleep apnea 07/31/2015  . Tobacco use 07/31/2015  . Diabetes ([place]) 07/10/2015  . Chronic systolic heart failure ([place]) 03/30/2015  . Cardiac pacemaker 03/30/2015  . Chronic respiratory failure with hypercapnia ([place]) 12/12/2014  . Elevated troponin   .  Non-ischemic cardiomyopathy ([place])   . Hypoxemia   . SVT (supraventricular tachycardia) ([place]) 12/09/2014  . NSTEMI (non-ST elevated myocardial infarction) ([place]) 12/09/2014  . Hypertension 12/09/2014    Past Surgical History:  Procedure Laterality Date  . CARDIAC CATHETERIZATION  12/11/2014   Procedure: RIGHT/LEFT HEART CATH AND CORONARY ANGIOGRAPHY;  Surgeon: Lorretta Harp, MD;  Location: [facility] CATH LAB;  Service: Cardiovascular;;  . ICD LEAD REMOVAL N/A 03/30/2015   Procedure: ICD LEAD REMOVAL;  Surgeon: Marzetta Board, MD;  Location: [facility] ORS;  Service: Cardiovascular;  Laterality: N/A;  . IMPLANTABLE CARDIOVERTER DEFIBRILLATOR IMPLANT    . INSERT / REPLACE / REMOVE PACEMAKER    . LEFT HEART CATHETERIZATION WITH CORONARY ANGIOGRAM N/A 12/09/2014   Procedure: LEFT HEART CATHETERIZATION WITH CORONARY ANGIOGRAM;  Surgeon: Burnell Blanks, MD;  Location: [facility] CATH LAB;  Service: Cardiovascular;  Laterality: N/A;    Allergies Bidil [isosorb dinitrate-hydralazine] and Ciprofloxacin  Social History Social History   Tobacco Use  . Smoking status: Current Every Day Smoker    Packs/day: 0.50    Years: 24.00    Pack years: 12.00    Types: Cigarettes  . Smokeless tobacco: Never Used  . Tobacco comment: 4/5 Smoking 5 cigs a day  Substance Use Topics  . Alcohol use: No  . Drug use: No   Review of Systems Constitutional: Negative for fever. ENT: Positive for right ear pain Cardiovascular: Negative for chest pain. Respiratory: Negative for shortness of breath. Gastrointestinal: Positive  for abdominal pain, nausea Musculoskeletal: Positive for back pain Skin: Negative for rash. Neurological: Negative for headaches, positive for weakness  All systems negative/normal/unremarkable except as stated in the HPI  ____________________________________________   PHYSICAL EXAM:  VITAL SIGNS: ED Triage Vitals [02/04/18 0935]  Enc Vitals Group     BP 101/62     Pulse Rate 85     Resp  18     Temp 97.7 F (36.5 C)     Temp Source Oral     SpO2 96 %     Weight (!) 302 lb (137 kg)     Height 5\' 8"  (1.727 m)     Head Circumference      Peak Flow      Pain Score 7     Pain Loc      Pain Edu?      Excl. in [place]?    Constitutional: Alert and oriented.  Morbidly obese, drowsy, no distress Eyes: Conjunctivae are normal. Normal extraocular movements. ENT   Head: Normocephalic and atraumatic.  Right TM is retracted with fluid behind it   Nose: No congestion/rhinnorhea.   Mouth/Throat: Mucous membranes are moist.   Neck: No stridor. Cardiovascular: Normal rate, regular rhythm. No murmurs, rubs, or gallops. Respiratory: Normal respiratory effort without tachypnea nor retractions. Breath sounds are clear and equal bilaterally. No wheezes/rales/rhonchi. Gastrointestinal: Epigastric tenderness, no rebound or guarding.  Normal bowel sounds. Musculoskeletal: Nontender with normal range of motion in extremities. No lower extremity tenderness nor edema. Neurologic:  Normal speech and language. No gross focal neurologic deficits are appreciated.  Skin:  Skin is warm, dry and intact. No rash noted. Psychiatric: Mood and affect are normal. Speech and behavior are normal.  ___________________________________________  ED COURSE:  As part of my medical decision making, I reviewed the following data within the [place] History obtained from family if available, nursing notes, old chart and ekg, as well as notes from prior ED visits. Patient presented for abdominal pain as well as right ear pain, we will assess with labs and imaging as indicated at this time.   Procedures ____________________________________________   LABS (pertinent positives/negatives)  Labs Reviewed  COMPREHENSIVE METABOLIC PANEL - Abnormal; Notable for the following components:      Result Value   Potassium 3.4 (*)    Chloride 100 (*)    Glucose, Bld 173 (*)    BUN 22 (*)     Calcium 8.7 (*)    Total Protein 8.6 (*)    AST 45 (*)    Alkaline Phosphatase 230 (*)    All other components within normal limits  URINALYSIS, COMPLETE (UACMP) WITH MICROSCOPIC - Abnormal; Notable for the following components:   Color, Urine YELLOW (*)    APPearance CLEAR (*)    Protein, ur 30 (*)    All other components within normal limits  TROPONIN I - Abnormal; Notable for the following components:   Troponin I 0.07 (*)    All other components within normal limits  CBC WITH DIFFERENTIAL/PLATELET  LIPASE, BLOOD   ____________________________________________  DIFFERENTIAL DIAGNOSIS   Otitis media, dehydration, electrolyte abnormality, pancreatitis, chronic pain, gastritis  FINAL ASSESSMENT AND PLAN  Otitis media, abdominal pain   Plan: The patient had presented for abdominal pain, poor p.o. intake and ear pain. Patient's labs did not reveal any acute abnormality, he has a chronically elevated troponin.  He did have some transient hypotension here which is likely related to his blood pressure medication.  Overall  his pain is likely coming from what has been believed to be chronic pancreatitis in the past.  Will be discharged with pain medicine, antiemetics and Ciprodex drops for his right ear.   Laurence Aly, MD   Note: This note was generated in part or whole with voice recognition software. Voice recognition is usually quite accurate but there are transcription errors that can and very often do occur. I apologize for any typographical errors that were not detected and corrected.     Earleen Newport, MD 02/04/18 1315

## 2018-02-04 NOTE — ED Triage Notes (Signed)
Pt has multiple complaints- lower abd pain/flank pain x 2 weeks, nausea and vomiting x 1 week. Also reports rt ear pain.

## 2018-02-04 NOTE — ED Notes (Signed)
AAOx3.  Skin warm and dry.  NAD 

## 2018-02-17 ENCOUNTER — Emergency Department
Admission: EM | Admit: 2018-02-17 | Discharge: 2018-02-17 | Disposition: A | Discharge status: Home / Self Care | Payer: Medicare Other | Attending: Emergency Medicine | Admitting: Emergency Medicine

## 2018-02-17 ENCOUNTER — Other Ambulatory Visit: Payer: Self-pay

## 2018-02-17 ENCOUNTER — Other Ambulatory Visit: Payer: Self-pay | Admitting: Family

## 2018-02-17 ENCOUNTER — Encounter: Payer: Self-pay | Admitting: Emergency Medicine

## 2018-02-17 DIAGNOSIS — Z79899 Other long term (current) drug therapy: Secondary | ICD-10-CM | POA: Diagnosis not present

## 2018-02-17 DIAGNOSIS — Z7982 Long term (current) use of aspirin: Secondary | ICD-10-CM | POA: Diagnosis not present

## 2018-02-17 DIAGNOSIS — I110 Hypertensive heart disease with heart failure: Secondary | ICD-10-CM | POA: Diagnosis not present

## 2018-02-17 DIAGNOSIS — F17210 Nicotine dependence, cigarettes, uncomplicated: Secondary | ICD-10-CM | POA: Diagnosis not present

## 2018-02-17 DIAGNOSIS — M109 Gout, unspecified: Secondary | ICD-10-CM | POA: Diagnosis not present

## 2018-02-17 DIAGNOSIS — I2510 Atherosclerotic heart disease of native coronary artery without angina pectoris: Secondary | ICD-10-CM | POA: Diagnosis not present

## 2018-02-17 DIAGNOSIS — J45909 Unspecified asthma, uncomplicated: Secondary | ICD-10-CM | POA: Insufficient documentation

## 2018-02-17 DIAGNOSIS — I5022 Chronic systolic (congestive) heart failure: Secondary | ICD-10-CM | POA: Insufficient documentation

## 2018-02-17 DIAGNOSIS — E119 Type 2 diabetes mellitus without complications: Secondary | ICD-10-CM | POA: Insufficient documentation

## 2018-02-17 DIAGNOSIS — Z794 Long term (current) use of insulin: Secondary | ICD-10-CM | POA: Diagnosis not present

## 2018-02-17 DIAGNOSIS — Z95810 Presence of automatic (implantable) cardiac defibrillator: Secondary | ICD-10-CM | POA: Insufficient documentation

## 2018-02-17 DIAGNOSIS — M79671 Pain in right foot: Secondary | ICD-10-CM | POA: Diagnosis present

## 2018-02-17 MED ORDER — COLCHICINE 0.6 MG PO TABS: 0.6000 mg | ORAL_TABLET | Freq: Once | ORAL | Status: DC

## 2018-02-17 MED ORDER — COLCHICINE 0.6 MG PO TABS
1.2000 mg | ORAL_TABLET | Freq: Once | ORAL | Status: AC
  Administered 2018-02-17: 1.2 mg via ORAL
  Filled 2018-02-17: qty 2

## 2018-02-17 MED ORDER — INDOMETHACIN 50 MG PO CAPS
50.0000 mg | ORAL_CAPSULE | Freq: Once | ORAL | Status: AC
  Administered 2018-02-17: 50 mg via ORAL
  Filled 2018-02-17: qty 1

## 2018-02-17 MED ORDER — TRAMADOL HCL 50 MG PO TABS: 50.0000 mg | ORAL_TABLET | Freq: Four times a day (QID) | ORAL | 0 refills | Status: DC | PRN

## 2018-02-17 NOTE — ED Notes (Signed)
Awaiting medications from pharmacy. Patient aware. Warm blanket given. No other needs at this time.

## 2018-02-17 NOTE — ED Triage Notes (Signed)
Patient to ER from home via [facility] for c/o right foot pain. States he began having right foot pain last night. States this am, was unable to walk on foot d/t pain. Patient with no obvious deformity, denies any known injury.

## 2018-02-17 NOTE — ED Provider Notes (Signed)
[facility] Emergency Department Provider Note   First MD Initiated Contact with Patient 02/17/18 [phone]     (approximate)  I have reviewed the triage vital signs and the nursing notes.   HISTORY  Chief Complaint Foot Pain    HPI Oscar [name] is a 42 y.o. male below list of chronic medical conditions presents to the emergency department with nontraumatic right foot pain which began tonight.  Patient states his current pain score is 10 out of 10.  Patient states that symptoms consistent with previous episodes of gout.   Past Medical History:  Diagnosis Date  . AICD (automatic cardioverter/defibrillator) present   . Asthma   . Cardiomyopathy ([place])   . CHF (congestive heart failure) ([place])   . Coronary artery disease   . Deafness in right ear   . Diabetes mellitus without complication ([place])   . Dilated cardiomyopathy ([place])   . Dysrhythmia    svt  . 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 GERD (gastroesophageal reflux disease)   . Hyperlipidemia   . Hypertension   . Hypoxemia   . Hypoxemia   . Mild obesity   . Myocardial infarction ([place])    5681,2751,7/00  . Pancreatitis   . Sleep apnea    osa    Patient Active Problem List   Diagnosis Date Noted  . Acute pancreatitis 05/19/2017  . Pancreatitis 09/12/2016  . Bronchitis 05/20/2016  . Constipation 11/08/2015  . Abdominal pain 11/02/2015  . Obstructive sleep apnea 07/31/2015  . Tobacco use 07/31/2015  . Diabetes ([place]) 07/10/2015  . Chronic systolic heart failure ([place]) 03/30/2015  . Cardiac pacemaker 03/30/2015  . Chronic respiratory failure with hypercapnia ([place]) 12/12/2014  . Elevated troponin   . Non-ischemic cardiomyopathy ([place])   . Hypoxemia   . SVT (supraventricular tachycardia) ([place]) 12/09/2014  . NSTEMI (non-ST elevated myocardial infarction) ([place]) 12/09/2014  . Hypertension 12/09/2014    Past Surgical History:  Procedure Laterality Date  . CARDIAC CATHETERIZATION   12/11/2014   Procedure: RIGHT/LEFT HEART CATH AND CORONARY ANGIOGRAPHY;  Surgeon: Lorretta Harp, MD;  Location: [facility] CATH LAB;  Service: Cardiovascular;;  . ICD LEAD REMOVAL N/A 03/30/2015   Procedure: ICD LEAD REMOVAL;  Surgeon: Marzetta Board, MD;  Location: [facility] ORS;  Service: Cardiovascular;  Laterality: N/A;  . IMPLANTABLE CARDIOVERTER DEFIBRILLATOR IMPLANT    . INSERT / REPLACE / REMOVE PACEMAKER    . LEFT HEART CATHETERIZATION WITH CORONARY ANGIOGRAM N/A 12/09/2014   Procedure: LEFT HEART CATHETERIZATION WITH CORONARY ANGIOGRAM;  Surgeon: Burnell Blanks, MD;  Location: [facility] CATH LAB;  Service: Cardiovascular;  Laterality: N/A;    Prior to Admission medications   Medication Sig Start Date End Date Taking? Authorizing Provider  amLODipine (NORVASC) 5 MG tablet Take 5 mg by mouth daily.    [provider]  aspirin EC 325 MG tablet Take 1 tablet (325 mg total) by mouth daily. 07/10/15   Aldean Jewett, MD  atorvastatin (LIPITOR) 40 MG tablet Take 1 tablet (40 mg total) by mouth daily at 6 PM. 09/13/16   Gouru, Aruna, MD  ciprofloxacin-dexamethasone (CIPRODEX) OTIC suspension Place 4 drops into the right ear 2 (two) times daily. 02/04/18   Earleen Newport, MD  ENTRESTO 49-51 MG TAKE 1 TABLET BY MOUTH TWO TIMES DAILY 06/17/17   Darylene Price A, FNP  furosemide (LASIX) 20 MG tablet Take 20 mg by mouth daily.    [provider]  insulin glargine (LANTUS)  100 UNIT/ML injection Inject 0.3 mLs (30 Units total) into the skin at bedtime. Patient taking differently: Inject 30 Units into the skin at bedtime. Takes 40 units twice daily 05/20/17   Epifanio Lesches, MD  insulin starter kit- syringes MISC 1 kit by Other route once. 07/10/15   Aldean Jewett, MD  metFORMIN (GLUCOPHAGE) 1000 MG tablet Take 1,000 mg by mouth 2 (two) times daily with a meal.    [provider]  metoprolol succinate (TOPROL-XL) 25 MG 24 hr tablet TAKE ONE TABLET BY MOUTH DAILY 07/10/17    Darylene Price A, FNP  omeprazole (PRILOSEC) 20 MG capsule Take 1 capsule (20 mg total) by mouth daily. 04/01/16   Zara Council A, PA-C  ondansetron (ZOFRAN ODT) 4 MG disintegrating tablet Take 1 tablet (4 mg total) by mouth every 8 (eight) hours as needed for nausea or vomiting. 02/04/18   Earleen Newport, MD  ondansetron (ZOFRAN ODT) 8 MG disintegrating tablet Take by mouth. 01/29/17   [provider]  oxyCODONE (ROXICODONE) 5 MG immediate release tablet Take 1 tablet (5 mg total) by mouth every 4 (four) hours as needed for severe pain. Patient not taking: Reported on 08/20/2017 08/13/17 08/13/18  Eula Listen, MD  oxyCODONE-acetaminophen (PERCOCET) 5-325 MG tablet Take 1-2 tablets by mouth every 8 (eight) hours as needed. 02/04/18   Earleen Newport, MD  oxyCODONE-acetaminophen (PERCOCET/ROXICET) 5-325 MG tablet Take by mouth. 08/05/17   [provider]  polyethylene glycol (MIRALAX / GLYCOLAX) packet Take 17 g by mouth daily. Takes as needed    [provider]  spironolactone (ALDACTONE) 25 MG tablet Take 1 tablet (25 mg total) by mouth daily. 09/19/16 01/29/18  Alisa Graff, FNP  Syringe, Disposable, 1 ML MISC 0.1 mLs by Does not apply route as directed. 08/21/15   Alisa Graff, FNP  tamsulosin (FLOMAX) 0.4 MG CAPS capsule Take 1 capsule (0.4 mg total) by mouth daily. 08/20/17   Lisa Roca, MD  tiotropium (SPIRIVA) 18 MCG inhalation capsule Place 18 mcg into inhaler and inhale daily.    [provider]  torsemide (DEMADEX) 20 MG tablet Take 20 mg by mouth daily.     [provider]    Allergies Bidil [isosorb dinitrate-hydralazine] and Ciprofloxacin  Family History  Problem Relation Age of Onset  . Hypertension Mother   . Congestive Heart Failure Mother   . Hypertension Sister   . Diabetes Sister   . Pancreatitis Sister   . COPD Sister   . Pancreatitis Brother   . Anemia Neg Hx   . Arrhythmia Neg Hx   . Asthma Neg Hx    . Clotting disorder Neg Hx   . Fainting Neg Hx   . Heart attack Neg Hx   . Heart disease Neg Hx   . Heart failure Neg Hx   . Hyperlipidemia Neg Hx     Social History Social History   Tobacco Use  . Smoking status: Current Every Day Smoker    Packs/day: 0.50    Years: 24.00    Pack years: 12.00    Types: Cigarettes  . Smokeless tobacco: Never Used  . Tobacco comment: 4/5 Smoking 5 cigs a day  Substance Use Topics  . Alcohol use: No  . Drug use: No    Review of Systems Constitutional: No fever/chills Eyes: No visual changes. ENT: No sore throat. Cardiovascular: Denies chest pain. Respiratory: Denies shortness of breath. Gastrointestinal: No abdominal pain.  No nausea, no vomiting.  No diarrhea.  No constipation. Genitourinary: Negative for dysuria. Musculoskeletal: Negative for neck pain.  Negative for back pain.  Positive for right foot pain Integumentary: Negative for rash. Neurological: Negative for headaches, focal weakness or numbness.   ____________________________________________   PHYSICAL EXAM:  VITAL SIGNS: ED Triage Vitals [02/17/18 0601]  Enc Vitals Group     BP (!) 163/108     Pulse Rate 80     Resp 20     Temp 97.7 F (36.5 C)     Temp Source Oral     SpO2 95 %     Weight (!) 137 kg (302 lb)     Height 1.727 m (5' 8" )     Head Circumference      Peak Flow      Pain Score 10     Pain Loc      Pain Edu?      Excl. in [place]?     Constitutional: Alert and oriented. Well appearing and in no acute distress. Eyes: Conjunctivae are normal.  Head: Atraumatic.Marland Kitchen Mouth/Throat: Mucous membranes are moist.  Oropharynx non-erythematous. Neck: No stridor.   Cardiovascular: Normal rate, regular rhythm. Good peripheral circulation. Grossly normal heart sounds. Respiratory: Normal respiratory effort.  No retractions. Lungs CTAB. Gastrointestinal: Soft and nontender. No distention.  Musculoskeletal: Pain with palpation of the right metatarsal phalangeal  joint and dorsal aspect of the right foot.  Area of pain warm to touch.  No overlying skin changes Neurologic:  Normal speech and language. No gross focal neurologic deficits are appreciated.  Skin:  Skin is warm, dry and intact. No rash noted. Psychiatric: Mood and affect are normal. Speech and behavior are normal.     Procedures   ____________________________________________   INITIAL IMPRESSION / ASSESSMENT AND PLAN / ED COURSE  As part of my medical decision making, I reviewed the following data within the electronic MEDICAL RECORD NUMBER   42 year old male presenting with above-stated history and physical exam secondary to right foot pain.  Patient's history and physical exam consistent with gout. ____________________________________________  FINAL CLINICAL IMPRESSION(S) / ED DIAGNOSES  Final diagnoses:  Acute gout of right foot, unspecified cause     MEDICATIONS GIVEN DURING THIS VISIT:  Medications  colchicine tablet 1.2 mg (has no administration in time range)  indomethacin (INDOCIN) capsule 50 mg (50 mg Oral Given 02/17/18 2919)     ED Discharge Orders    None       Note:  This document was prepared using Dragon voice recognition software and may include unintentional dictation errors.    Gregor Hams, MD 02/17/18 [phone]

## 2018-03-04 ENCOUNTER — Other Ambulatory Visit: Payer: Self-pay | Admitting: Family

## 2018-03-05 ENCOUNTER — Encounter: Payer: Self-pay | Admitting: Family

## 2018-03-05 ENCOUNTER — Ambulatory Visit: Payer: Medicare Other | Attending: Family | Admitting: Family

## 2018-03-05 VITALS — BP 139/96 | HR 90 | Resp 18 | Ht 66.0 in | Wt 298.0 lb

## 2018-03-05 DIAGNOSIS — K859 Acute pancreatitis without necrosis or infection, unspecified: Secondary | ICD-10-CM | POA: Insufficient documentation

## 2018-03-05 DIAGNOSIS — E669 Obesity, unspecified: Secondary | ICD-10-CM | POA: Insufficient documentation

## 2018-03-05 DIAGNOSIS — Z79899 Other long term (current) drug therapy: Secondary | ICD-10-CM | POA: Insufficient documentation

## 2018-03-05 DIAGNOSIS — Z794 Long term (current) use of insulin: Secondary | ICD-10-CM | POA: Insufficient documentation

## 2018-03-05 DIAGNOSIS — Z6841 Body Mass Index (BMI) 40.0 and over, adult: Secondary | ICD-10-CM | POA: Insufficient documentation

## 2018-03-05 DIAGNOSIS — J45909 Unspecified asthma, uncomplicated: Secondary | ICD-10-CM | POA: Insufficient documentation

## 2018-03-05 DIAGNOSIS — F17210 Nicotine dependence, cigarettes, uncomplicated: Secondary | ICD-10-CM | POA: Diagnosis not present

## 2018-03-05 DIAGNOSIS — Z95810 Presence of automatic (implantable) cardiac defibrillator: Secondary | ICD-10-CM | POA: Insufficient documentation

## 2018-03-05 DIAGNOSIS — I5022 Chronic systolic (congestive) heart failure: Secondary | ICD-10-CM | POA: Insufficient documentation

## 2018-03-05 DIAGNOSIS — I252 Old myocardial infarction: Secondary | ICD-10-CM | POA: Insufficient documentation

## 2018-03-05 DIAGNOSIS — K861 Other chronic pancreatitis: Secondary | ICD-10-CM

## 2018-03-05 DIAGNOSIS — Z720 Tobacco use: Secondary | ICD-10-CM

## 2018-03-05 DIAGNOSIS — I10 Essential (primary) hypertension: Secondary | ICD-10-CM

## 2018-03-05 DIAGNOSIS — Z7982 Long term (current) use of aspirin: Secondary | ICD-10-CM | POA: Insufficient documentation

## 2018-03-05 DIAGNOSIS — I2510 Atherosclerotic heart disease of native coronary artery without angina pectoris: Secondary | ICD-10-CM | POA: Diagnosis not present

## 2018-03-05 DIAGNOSIS — M109 Gout, unspecified: Secondary | ICD-10-CM | POA: Diagnosis not present

## 2018-03-05 DIAGNOSIS — G4733 Obstructive sleep apnea (adult) (pediatric): Secondary | ICD-10-CM | POA: Insufficient documentation

## 2018-03-05 DIAGNOSIS — E785 Hyperlipidemia, unspecified: Secondary | ICD-10-CM | POA: Insufficient documentation

## 2018-03-05 DIAGNOSIS — E119 Type 2 diabetes mellitus without complications: Secondary | ICD-10-CM | POA: Diagnosis not present

## 2018-03-05 DIAGNOSIS — K219 Gastro-esophageal reflux disease without esophagitis: Secondary | ICD-10-CM | POA: Insufficient documentation

## 2018-03-05 DIAGNOSIS — I110 Hypertensive heart disease with heart failure: Secondary | ICD-10-CM | POA: Insufficient documentation

## 2018-03-05 DIAGNOSIS — Z881 Allergy status to other antibiotic agents status: Secondary | ICD-10-CM | POA: Insufficient documentation

## 2018-03-05 NOTE — Progress Notes (Signed)
Patient ID: Oscar [name], male    DOB: 08/06/1976, 42 y.o.   MRN: [id number]  HPI  Oscar [name] is a 42 y/o male with a history of obstructive sleep apnea (currently with CPAP), MI, HTN, hyperlipidemia, GERD, DM, SVT, asthma and chronic heart failure.  Last echo was done 02/06/15 which showed an EF of 25% which is unchanged from previous echo on 12/10/14 which showed an EF of 25% and mild Oscar.   Was in the ED 02/17/18 due to acute gout of right foot. Medications were given and he was released. Was in the ED 02/08/18 due to acute pancreatitis where he was treated and released. Was in the ED 02/04/18 due to right acute serous otitis media. Given ear drops and was released.   He presents today for a follow-up visit with a chief complaint of minimal fatigue upon moderate exertion. He describes this as chronic in nature having been present for several years with varying levels of severity. He has associated cough, wheezing, abdominal pain, headaches and right foot pain (due to gout) along with this. He denies any weight gain, abdominal distention, palpitations, pedal edema, chest pain, shortness of breath, dizziness or weight gain. Difficulty walking right now due to gout flare in right foot.  Past Medical History:  Diagnosis Date  . AICD (automatic cardioverter/defibrillator) present   . Asthma   . Cardiomyopathy ([place])   . CHF (congestive heart failure) ([place])   . Coronary artery disease   . Deafness in right ear   . Diabetes mellitus without complication ([place])   . Dilated cardiomyopathy ([place])   . Dysrhythmia    svt  . 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 GERD (gastroesophageal reflux disease)   . Hyperlipidemia   . Hypertension   . Hypoxemia   . Hypoxemia   . Mild obesity   . Myocardial infarction ([place])    4944,9675,9/16  . Pancreatitis   . Sleep apnea    osa   Past Surgical History:  Procedure Laterality Date  . CARDIAC CATHETERIZATION  12/11/2014   Procedure: RIGHT/LEFT  HEART CATH AND CORONARY ANGIOGRAPHY;  Surgeon: Lorretta Harp, MD;  Location: [facility] CATH LAB;  Service: Cardiovascular;;  . ICD LEAD REMOVAL N/A 03/30/2015   Procedure: ICD LEAD REMOVAL;  Surgeon: Marzetta Board, MD;  Location: [facility] ORS;  Service: Cardiovascular;  Laterality: N/A;  . IMPLANTABLE CARDIOVERTER DEFIBRILLATOR IMPLANT    . INSERT / REPLACE / REMOVE PACEMAKER    . LEFT HEART CATHETERIZATION WITH CORONARY ANGIOGRAM N/A 12/09/2014   Procedure: LEFT HEART CATHETERIZATION WITH CORONARY ANGIOGRAM;  Surgeon: Burnell Blanks, MD;  Location: [facility] CATH LAB;  Service: Cardiovascular;  Laterality: N/A;   Family History  Problem Relation Age of Onset  . Hypertension Mother   . Congestive Heart Failure Mother   . Hypertension Sister   . Diabetes Sister   . Pancreatitis Sister   . COPD Sister   . Pancreatitis Brother   . Anemia Neg Hx   . Arrhythmia Neg Hx   . Asthma Neg Hx   . Clotting disorder Neg Hx   . Fainting Neg Hx   . Heart attack Neg Hx   . Heart disease Neg Hx   . Heart failure Neg Hx   . Hyperlipidemia Neg Hx    Social History   Tobacco Use  . Smoking status: Current Every Day Smoker    Packs/day: 0.50    Years: 24.00    Pack years:  12.00    Types: Cigarettes  . Smokeless tobacco: Never Used  . Tobacco comment: 4/5 Smoking 5 cigs a day  Substance Use Topics  . Alcohol use: No   Allergies  Allergen Reactions  . Bidil [Isosorb Dinitrate-Hydralazine] Other (See Comments)    Migraine Headache Migraine Headache  . Ciprofloxacin    Prior to Admission medications   Medication Sig Start Date End Date Taking? Authorizing Provider  amLODipine (NORVASC) 5 MG tablet Take 5 mg by mouth daily.   Yes [provider]  aspirin EC 325 MG tablet Take 1 tablet (325 mg total) by mouth daily. 07/10/15  Yes Aldean Jewett, MD  atorvastatin (LIPITOR) 40 MG tablet Take 1 tablet (40 mg total) by mouth daily at 6 PM. 09/13/16  Yes Gouru, Aruna, MD  furosemide (LASIX) 20  MG tablet Take 20 mg by mouth daily.   Yes [provider]  insulin glargine (LANTUS) 100 UNIT/ML injection Inject 0.3 mLs (30 Units total) into the skin at bedtime. Patient taking differently: Inject 30 Units into the skin at bedtime. Takes 40 units twice daily 05/20/17  Yes Epifanio Lesches, MD  insulin starter kit- syringes MISC 1 kit by Other route once. 07/10/15  Yes Aldean Jewett, MD  metoprolol succinate (TOPROL-XL) 25 MG 24 hr tablet TAKE ONE TABLET BY MOUTH DAILY 07/10/17  Yes Darylene Price A, FNP  omeprazole (PRILOSEC) 20 MG capsule Take 1 capsule (20 mg total) by mouth daily. 04/01/16  Yes McGowan, Larene Beach A, PA-C  polyethylene glycol (MIRALAX / GLYCOLAX) packet Take 17 g by mouth daily. Takes as needed   Yes [provider]  spironolactone (ALDACTONE) 25 MG tablet TAKE 1 TABLET BY MOUTH EVERY DAY. 02/17/18  Yes Bliss Tsang A, FNP  Syringe, Disposable, 1 ML MISC 0.1 mLs by Does not apply route as directed. 08/21/15  Yes Darylene Price A, FNP  tamsulosin (FLOMAX) 0.4 MG CAPS capsule Take 1 capsule (0.4 mg total) by mouth daily. 08/20/17  Yes Lisa Roca, MD  tiotropium (SPIRIVA) 18 MCG inhalation capsule Place 18 mcg into inhaler and inhale daily.   Yes [provider]  torsemide (DEMADEX) 20 MG tablet TAKE 1 TABLET BY MOUTH EVERY DAY. 02/17/18  Yes Alisa Graff, FNP  ENTRESTO 49-51 MG TAKE 1 TABLET BY MOUTH TWO TIMES DAILY 03/05/18   Alisa Graff, FNP    Review of Systems  Constitutional: Positive for fatigue. Negative for appetite change.  HENT: Negative for congestion, postnasal drip and sore throat.   Eyes: Negative.   Respiratory: Positive for cough and wheezing. Negative for chest tightness and shortness of breath.   Cardiovascular: Negative for chest pain, palpitations and leg swelling.  Gastrointestinal: Positive for abdominal pain (at times). Negative for abdominal distention and diarrhea.  Endocrine: Negative.   Genitourinary: Negative.    Musculoskeletal: Positive for arthralgias (right foot pain due to gout). Negative for back pain.  Skin: Negative.   Allergic/Immunologic: Negative.   Neurological: Positive for headaches. Negative for dizziness and light-headedness.  Hematological: Negative for adenopathy. Does not bruise/bleed easily.  Psychiatric/Behavioral: Negative for dysphoric mood, sleep disturbance (sleeping on 4 pillows) and suicidal ideas. The patient is not nervous/anxious.    Vitals:   03/05/18 1103  BP: (!) 139/96  Pulse: 90  Resp: 18  SpO2: 97%  Weight: 298 lb (135.2 kg)  Height: 5' 6"  (1.676 m)   Wt Readings from Last 3 Encounters:  03/05/18 298 lb (135.2 kg)  02/17/18 (!) 302 lb (137 kg)  02/04/18 (!) 302 lb (137 kg)   Lab Results  Component Value Date   CREATININE 1.13 02/04/2018   CREATININE 1.59 (H) 08/20/2017   CREATININE 1.10 08/13/2017    Physical Exam  Constitutional: He is oriented to person, place, and time. He appears well-developed and well-nourished.  HENT:  Head: Normocephalic and atraumatic.  Neck: Normal range of motion. Neck supple. No JVD present.  Cardiovascular: Normal rate and regular rhythm.  Pulmonary/Chest: Effort normal. He has no wheezes. He has no rales.  Abdominal: Soft. He exhibits no distension. There is no tenderness.  Musculoskeletal: He exhibits no edema or tenderness.  Neurological: He is alert and oriented to person, place, and time.  Skin: Skin is warm and dry.  Psychiatric: He has a normal mood and affect. His behavior is normal. Thought content normal.  Nursing note and vitals reviewed.    Assessment & Plan:  1: Chronic heart failure with reduced ejection fraction- - NYHA class II - euvolemic today - weight gradually declining at home due to pancreatitis. Reminded to call for an overnight weight gain of >2 pounds or a weekly weight gain of >5 pounds.  - weight down 20 pounds since last here 3 months ago - tolerating toprol XL without known side  effects; consider titrating up - consider titrating up entresto as well - last saw cardiology [name]) 10/08/17 - bidil gave him migraine headache - per [place], tested O2 sat on room air and the lowest it went to was 93%, then walked patient on room air and he was 100% and then walked patient on 2L of oxygen and he stayed at 100%. Could do another sleep study test if needed.  - does not meet ReDS vest criteria due to BMI - BNP on 05/18/17 was 156.0 - he wants to hold off on echocardiogram until his GI issues get straightened out  2: HTN- - BP slightly elevated today but he says that his right foot is hurting quite a bit from gout - sees PCP [name]) at [place] from 02/04/18 reviewed and shows sodium 137, potassium 3.4 & GFR >60  3: Obstructive sleep apnea- - wearing his CPAP nightly - continues wearing oxygen at 2-3L   4: Diabetes- - fasting glucose at home was 235 - lantus is being titrated up by PCP - encouraged him to follow closely with his PCP or ask about a referral to an endocrinologist - A1c from 09/12/16 was 10.9%  5: Tobacco use- - continues to smoke 4 cigarettes daily - does remove himself from the oxygen when smoking - complete cessation discussed for 3 minutes with him  6: Pancreatitis- - says when it flares, that his stomach hurts him so much that he can't really eat and can only keep down jello, freezer pops etc - denies any alcohol use - saw GI provider Ellison Hughs) 03/02/18  Patient did not bring his medications nor a list. Each medication was verbally reviewed with the patient and he was encouraged to bring the bottles to every visit to confirm accuracy of list.  Return in 2 months or sooner for any questions/problems before then.

## 2018-03-05 NOTE — Patient Instructions (Signed)
Continue weighing daily and call for an overnight weight gain of > 2 pounds or a weekly weight gain of >5 pounds. 

## 2018-03-18 ENCOUNTER — Other Ambulatory Visit: Payer: Self-pay | Admitting: Family

## 2018-03-18 DIAGNOSIS — K219 Gastro-esophageal reflux disease without esophagitis: Secondary | ICD-10-CM

## 2018-03-27 ENCOUNTER — Encounter: Payer: Self-pay | Admitting: Emergency Medicine

## 2018-03-27 ENCOUNTER — Emergency Department
Admission: EM | Admit: 2018-03-27 | Discharge: 2018-03-27 | Disposition: A | Discharge status: Home / Self Care | Payer: Medicare Other | Attending: Emergency Medicine | Admitting: Emergency Medicine

## 2018-03-27 ENCOUNTER — Other Ambulatory Visit: Payer: Self-pay

## 2018-03-27 DIAGNOSIS — Z7982 Long term (current) use of aspirin: Secondary | ICD-10-CM | POA: Insufficient documentation

## 2018-03-27 DIAGNOSIS — F17210 Nicotine dependence, cigarettes, uncomplicated: Secondary | ICD-10-CM | POA: Diagnosis not present

## 2018-03-27 DIAGNOSIS — Z950 Presence of cardiac pacemaker: Secondary | ICD-10-CM | POA: Insufficient documentation

## 2018-03-27 DIAGNOSIS — I110 Hypertensive heart disease with heart failure: Secondary | ICD-10-CM | POA: Diagnosis not present

## 2018-03-27 DIAGNOSIS — Z794 Long term (current) use of insulin: Secondary | ICD-10-CM | POA: Insufficient documentation

## 2018-03-27 DIAGNOSIS — J45909 Unspecified asthma, uncomplicated: Secondary | ICD-10-CM | POA: Insufficient documentation

## 2018-03-27 DIAGNOSIS — E119 Type 2 diabetes mellitus without complications: Secondary | ICD-10-CM | POA: Insufficient documentation

## 2018-03-27 DIAGNOSIS — M25552 Pain in left hip: Secondary | ICD-10-CM | POA: Diagnosis present

## 2018-03-27 DIAGNOSIS — M5442 Lumbago with sciatica, left side: Secondary | ICD-10-CM | POA: Diagnosis not present

## 2018-03-27 DIAGNOSIS — Z79899 Other long term (current) drug therapy: Secondary | ICD-10-CM | POA: Diagnosis not present

## 2018-03-27 DIAGNOSIS — I5022 Chronic systolic (congestive) heart failure: Secondary | ICD-10-CM | POA: Insufficient documentation

## 2018-03-27 LAB — GLUCOSE, CAPILLARY: GLUCOSE-CAPILLARY: 149 mg/dL — AB (ref 70–99)

## 2018-03-27 MED ORDER — NABUMETONE 500 MG PO TABS: 500.0000 mg | ORAL_TABLET | Freq: Every day | ORAL | 0 refills | Status: DC

## 2018-03-27 MED ORDER — KETOROLAC TROMETHAMINE 30 MG/ML IJ SOLN
30.0000 mg | Freq: Once | INTRAMUSCULAR | Status: AC
  Administered 2018-03-27: 30 mg via INTRAMUSCULAR
  Filled 2018-03-27: qty 1

## 2018-03-27 MED ORDER — TRAMADOL HCL 50 MG PO TABS: 50.0000 mg | ORAL_TABLET | Freq: Four times a day (QID) | ORAL | 0 refills | Status: DC | PRN

## 2018-03-27 MED ORDER — HYDROCODONE-ACETAMINOPHEN 5-325 MG PO TABS: 1.0000 | ORAL_TABLET | Freq: Four times a day (QID) | ORAL | 0 refills | Status: DC | PRN

## 2018-03-27 NOTE — ED Triage Notes (Signed)
Pt c/o left hip pain, states the pain has been increasingly worsening over the past 3 weeks, pt seen here 1 month ago for gout. Pt states he has been able to walk on it but is painful

## 2018-03-27 NOTE — ED Notes (Signed)
See triage note states he developed lower back pain which radiates into left leg  Denies any injury  Increased pain with standing  States he was using some left over percocet for pain  Denies any bowel or bladder problems

## 2018-03-27 NOTE — ED Provider Notes (Signed)
[facility] Emergency Department Provider Note  ____________________________________________   First MD Initiated Contact with Patient 03/27/18 [phone]     (approximate)  I have reviewed the triage vital signs and the nursing notes.   HISTORY  Chief Complaint Hip Pain  HPI Oscar [name] is a 42 y.o. male is here complaining of left hip pain that radiates into his left leg.  Patient states that he has had problems for 3 weeks.  He has not seen his PCP for this.  He was also seen in the emergency department 1 month ago for a gout flareup in his right foot.  Patient states that he was taken oxycodone for this but did not receive a prescription for colchicine.  He states that he is able to walk and stand for short periods of time without pain.  Currently rates his pain as an 8 out of 10.  He denies any incontinence of bowel or bladder, saddle anesthesias.   Past Medical History:  Diagnosis Date  . AICD (automatic cardioverter/defibrillator) present   . Asthma   . Cardiomyopathy ([place])   . CHF (congestive heart failure) ([place])   . Coronary artery disease   . Deafness in right ear   . Diabetes mellitus without complication ([place])   . Dilated cardiomyopathy ([place])   . Dysrhythmia    svt  . 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 GERD (gastroesophageal reflux disease)   . Hyperlipidemia   . Hypertension   . Hypoxemia   . Hypoxemia   . Mild obesity   . Myocardial infarction ([place])    4782,9562,1/30  . Pancreatitis   . Sleep apnea    osa    Patient Active Problem List   Diagnosis Date Noted  . Acute pancreatitis 05/19/2017  . Pancreatitis 09/12/2016  . Bronchitis 05/20/2016  . Constipation 11/08/2015  . Abdominal pain 11/02/2015  . Obstructive sleep apnea 07/31/2015  . Tobacco use 07/31/2015  . Diabetes ([place]) 07/10/2015  . Chronic systolic heart failure ([place]) 03/30/2015  . Cardiac pacemaker 03/30/2015  . Chronic respiratory failure with  hypercapnia ([place]) 12/12/2014  . Elevated troponin   . Non-ischemic cardiomyopathy ([place])   . Hypoxemia   . SVT (supraventricular tachycardia) ([place]) 12/09/2014  . NSTEMI (non-ST elevated myocardial infarction) ([place]) 12/09/2014  . Hypertension 12/09/2014    Past Surgical History:  Procedure Laterality Date  . CARDIAC CATHETERIZATION  12/11/2014   Procedure: RIGHT/LEFT HEART CATH AND CORONARY ANGIOGRAPHY;  Surgeon: Lorretta Harp, MD;  Location: [facility] CATH LAB;  Service: Cardiovascular;;  . ICD LEAD REMOVAL N/A 03/30/2015   Procedure: ICD LEAD REMOVAL;  Surgeon: Marzetta Board, MD;  Location: [facility] ORS;  Service: Cardiovascular;  Laterality: N/A;  . IMPLANTABLE CARDIOVERTER DEFIBRILLATOR IMPLANT    . INSERT / REPLACE / REMOVE PACEMAKER    . LEFT HEART CATHETERIZATION WITH CORONARY ANGIOGRAM N/A 12/09/2014   Procedure: LEFT HEART CATHETERIZATION WITH CORONARY ANGIOGRAM;  Surgeon: Burnell Blanks, MD;  Location: [facility] CATH LAB;  Service: Cardiovascular;  Laterality: N/A;    Prior to Admission medications   Medication Sig Start Date End Date Taking? Authorizing Provider  amLODipine (NORVASC) 5 MG tablet Take 5 mg by mouth daily.    [provider]  aspirin EC 325 MG tablet Take 1 tablet (325 mg total) by mouth daily. 07/10/15   Aldean Jewett, MD  atorvastatin (LIPITOR) 40 MG tablet Take 1 tablet (40 mg total) by mouth daily at 6 PM. 09/13/16  Nicholes Mango, MD  ENTRESTO 49-51 MG TAKE 1 TABLET BY MOUTH TWO TIMES DAILY 03/05/18   Darylene Price A, FNP  furosemide (LASIX) 20 MG tablet Take 20 mg by mouth daily.    [provider]  HYDROcodone-acetaminophen (NORCO/VICODIN) 5-325 MG tablet Take 1 tablet by mouth every 6 (six) hours as needed. 03/27/18   Johnn Hai, PA-C  insulin glargine (LANTUS) 100 UNIT/ML injection Inject 0.3 mLs (30 Units total) into the skin at bedtime. Patient taking differently: Inject 30 Units into the skin at bedtime. Takes 40 units twice daily  05/20/17   Epifanio Lesches, MD  insulin starter kit- syringes MISC 1 kit by Other route once. 07/10/15   Aldean Jewett, MD  metoprolol succinate (TOPROL-XL) 25 MG 24 hr tablet TAKE ONE TABLET BY MOUTH DAILY 07/10/17   Darylene Price A, FNP  nabumetone (RELAFEN) 500 MG tablet Take 1 tablet (500 mg total) by mouth daily. 03/27/18   Johnn Hai, PA-C  omeprazole (PRILOSEC) 20 MG capsule Take 1 capsule (20 mg total) by mouth daily. 04/01/16   Zara Council A, PA-C  polyethylene glycol (MIRALAX / GLYCOLAX) packet Take 17 g by mouth daily. Takes as needed    [provider]  spironolactone (ALDACTONE) 25 MG tablet TAKE 1 TABLET BY MOUTH EVERY DAY. 02/17/18   Alisa Graff, FNP  Syringe, Disposable, 1 ML MISC 0.1 mLs by Does not apply route as directed. 08/21/15   Alisa Graff, FNP  tamsulosin (FLOMAX) 0.4 MG CAPS capsule Take 1 capsule (0.4 mg total) by mouth daily. 08/20/17   Lisa Roca, MD  tiotropium (SPIRIVA) 18 MCG inhalation capsule Place 18 mcg into inhaler and inhale daily.    [provider]  torsemide (DEMADEX) 20 MG tablet TAKE 1 TABLET BY MOUTH EVERY DAY. 02/17/18   Alisa Graff, FNP    Allergies Bidil [isosorb dinitrate-hydralazine] and Ciprofloxacin  Family History  Problem Relation Age of Onset  . Hypertension Mother   . Congestive Heart Failure Mother   . Hypertension Sister   . Diabetes Sister   . Pancreatitis Sister   . COPD Sister   . Pancreatitis Brother   . Anemia Neg Hx   . Arrhythmia Neg Hx   . Asthma Neg Hx   . Clotting disorder Neg Hx   . Fainting Neg Hx   . Heart attack Neg Hx   . Heart disease Neg Hx   . Heart failure Neg Hx   . Hyperlipidemia Neg Hx     Social History Social History   Tobacco Use  . Smoking status: Current Every Day Smoker    Packs/day: 0.50    Years: 24.00    Pack years: 12.00    Types: Cigarettes  . Smokeless tobacco: Never Used  . Tobacco comment: 4/5 Smoking 5 cigs a day  Substance Use  Topics  . Alcohol use: No  . Drug use: No    Review of Systems Constitutional: No fever/chills Cardiovascular: Denies chest pain. Respiratory: Denies shortness of breath. Musculoskeletal: Positive for low back pain with left radiculopathy. Skin: Negative for rash. Neurological: Negative for  focal weakness or numbness. ___________________________________________   PHYSICAL EXAM:  VITAL SIGNS: ED Triage Vitals  Enc Vitals Group     BP 03/27/18 0924 134/85     Pulse Rate 03/27/18 0924 88     Resp --      Temp 03/27/18 0924 98.4 F (36.9 C)     Temp Source 03/27/18 0924 Oral  SpO2 03/27/18 0924 96 %     Weight 03/27/18 0925 293 lb (132.9 kg)     Height 03/27/18 0925 _0  (1.651 m)     Head Circumference --      Peak Flow --      Pain Score 03/27/18 0925 8     Pain Loc --      Pain Edu? --      Excl. in [place]? --    Constitutional: Alert and oriented. Well appearing and in no acute distress. Eyes: Conjunctivae are normal.  Head: Atraumatic. Neck: No stridor.   Cardiovascular: Normal rate, regular rhythm. Grossly normal heart sounds.  Good peripheral circulation. Respiratory: Normal respiratory effort.  No retractions. Lungs CTAB. Gastrointestinal: Soft and nontender. No distention.  Musculoskeletal: On examination of lower back there is no gross deformity however there is tenderness on palpation of the left paravertebral muscles L5-S1 area and soft tissue surrounding inside joint area.  Range of motion is minimally limited secondary to discomfort.  Good muscle strength bilaterally.  Straight leg raises are negative. Neurologic:  Normal speech and language. No gross focal neurologic deficits are appreciated.  Reflexes are 2+ bilaterally.  No gait instability. Skin:  Skin is warm, dry and intact.  Psychiatric: Mood and affect are normal. Speech and behavior are normal.  ____________________________________________   LABS (all labs ordered are listed, but only abnormal  results are displayed)  Labs Reviewed  GLUCOSE, CAPILLARY - Abnormal; Notable for the following components:      Result Value   Glucose-Capillary 149 (*)    All other components within normal limits  CBG MONITORING, ED     PROCEDURES  Procedure(s) performed: None  Procedures  Critical Care performed: No  ____________________________________________   INITIAL IMPRESSION / ASSESSMENT AND PLAN / ED COURSE  As part of my medical decision making, I reviewed the following data within the electronic MEDICAL RECORD NUMBER Notes from prior ED visits and [place] Controlled Substance Database  Patient was encouraged to follow-up with his PCP which he states is now [employer].  Patient has not seen them for his low back pain with sciatica.  Patient originally was written a prescription for Relafen 500 mg twice daily and tramadol however patient states that tramadol does not help with his pain.  He states the last time he was at [facility] he got a prescription for oxycodone.  In looking at the controlled substance database it appears that he was seen on 6/19 and did not receive anything but tramadol.  He was also seen at [facility] on 6/19 at which time he was given a narcotic.  Patient states that it is useless for him to take the tramadol.  Prescription was written for hydrocodone No. 8 tablets and Relafen 500 mg twice daily. ____________________________________________   FINAL CLINICAL IMPRESSION(S) / ED DIAGNOSES  Final diagnoses:  Acute left-sided low back pain with left-sided sciatica     ED Discharge Orders        Ordered    nabumetone (RELAFEN) 500 MG tablet  Daily,   Status:  Discontinued     03/27/18 1107    traMADol (ULTRAM) 50 MG tablet  Every 6 hours PRN,   Status:  Discontinued     03/27/18 1107    nabumetone (RELAFEN) 500 MG tablet  Daily     03/27/18 1122    HYDROcodone-acetaminophen (NORCO/VICODIN) 5-325 MG tablet  Every 6 hours PRN     03/27/18 1122  Note:   This document was prepared using Dragon voice recognition software and may include unintentional dictation errors.    Johnn Hai, PA-C 03/27/18 1216    Delman Kitten, MD 03/27/18 1501

## 2018-03-27 NOTE — Discharge Instructions (Signed)
You will need to call make an appointment with your primary care provider for follow-up of your pain next week.  Begin taking tramadol 1 every 6 hours as needed for pain.  Relafen 500 mg twice daily with food.  You may use ice or heat to your lower back and elevate your left leg as needed for pain.

## 2018-03-27 NOTE — ED Notes (Signed)
Glucose was checked 149

## 2018-04-08 ENCOUNTER — Ambulatory Visit (INDEPENDENT_AMBULATORY_CARE_PROVIDER_SITE_OTHER): Payer: Medicare Other | Admitting: Podiatry

## 2018-04-08 ENCOUNTER — Encounter: Payer: Self-pay | Admitting: Podiatry

## 2018-04-08 DIAGNOSIS — B351 Tinea unguium: Secondary | ICD-10-CM

## 2018-04-08 DIAGNOSIS — M79676 Pain in unspecified toe(s): Secondary | ICD-10-CM

## 2018-04-08 DIAGNOSIS — E119 Type 2 diabetes mellitus without complications: Secondary | ICD-10-CM

## 2018-04-08 NOTE — Progress Notes (Signed)
Complaint:  Visit Type: Patient returns to my office for continued preventative foot care services. Complaint: Patient states" my nails have grown long and thick and become painful to walk and wear shoes" Patient has been diagnosed with DM with no foot complications. The patient presents for preventative foot care services. No changes to ROS  Podiatric Exam: Vascular: dorsalis pedis and posterior tibial pulses are palpable bilateral. Capillary return is immediate. Temperature gradient is WNL. Skin turgor WNL  Sensorium: Normal Semmes Weinstein monofilament test. Normal tactile sensation bilaterally. Nail Exam: Pt has thick disfigured discolored nails with subungual debris noted bilateral entire nail hallux through fifth toenails Ulcer Exam: There is no evidence of ulcer or pre-ulcerative changes or infection. Orthopedic Exam: Muscle tone and strength are WNL. No limitations in general ROM. No crepitus or effusions noted. Foot type and digits show no abnormalities. Bony prominences are unremarkable. Skin: No Porokeratosis. No infection or ulcers.  Heel calus  B/L.  Diagnosis:  Onychomycosis, , Pain in right toe, pain in left toes  Treatment & Plan Procedures and Treatment: Consent by patient was obtained for treatment procedures. The patient understood the discussion of treatment and procedures well. All questions were answered thoroughly reviewed. Debridement of mycotic and hypertrophic toenails, 1 through 5 bilateral and clearing of subungual debris. No ulceration, no infection noted. Medicaid  ABN signed for 2019. T Return Visit-Office Procedure: Patient instructed to return to the office for a follow up visit 3 months for continued evaluation and treatment.    [name] DPM

## 2018-04-14 ENCOUNTER — Other Ambulatory Visit: Payer: Self-pay | Admitting: Family Medicine

## 2018-04-14 DIAGNOSIS — M545 Low back pain: Secondary | ICD-10-CM

## 2018-04-15 ENCOUNTER — Other Ambulatory Visit: Payer: Self-pay | Admitting: Family

## 2018-04-15 ENCOUNTER — Other Ambulatory Visit (HOSPITAL_COMMUNITY): Payer: Self-pay | Admitting: Family Medicine

## 2018-04-15 DIAGNOSIS — M545 Low back pain: Secondary | ICD-10-CM

## 2018-04-23 ENCOUNTER — Ambulatory Visit (HOSPITAL_COMMUNITY): Payer: Medicare Other | Attending: Family Medicine

## 2018-05-02 NOTE — Progress Notes (Deleted)
Patient ID: Oscar [name], male    DOB: [birth year], 42 y.o.   MRN: [id number]  HPI  Oscar [name] is a 42 y/o male with a history of obstructive sleep apnea (currently with CPAP), MI, HTN, hyperlipidemia, GERD, DM, SVT, asthma and chronic heart failure.  Last echo was done 02/06/15 which showed an EF of 25% which is unchanged from previous echo on 12/10/14 which showed an EF of 25% and mild Oscar.   Was in the ED 03/27/18 due to left hip pain where he was treated and released. Was in the ED 02/17/18 due to acute gout of right foot. Medications were given and he was released. Was in the ED 02/08/18 due to acute pancreatitis where he was treated and released. Was in the ED 02/04/18 due to right acute serous otitis media. Given ear drops and was released.   He presents today for a follow-up visit with a chief complaint of   Past Medical History:  Diagnosis Date  . AICD (automatic cardioverter/defibrillator) present   . Asthma   . Cardiomyopathy ([place])   . CHF (congestive heart failure) ([place])   . Coronary artery disease   . Deafness in right ear   . Diabetes mellitus without complication ([place])   . Dilated cardiomyopathy ([place])   . Dysrhythmia    svt  . 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 GERD (gastroesophageal reflux disease)   . Hyperlipidemia   . Hypertension   . Hypoxemia   . Hypoxemia   . Mild obesity   . Myocardial infarction ([place])    [id number],8/46  . Pancreatitis   . Sleep apnea    osa   Past Surgical History:  Procedure Laterality Date  . CARDIAC CATHETERIZATION  12/11/2014   Procedure: RIGHT/LEFT HEART CATH AND CORONARY ANGIOGRAPHY;  Surgeon: Lorretta Harp, MD;  Location: [facility] CATH LAB;  Service: Cardiovascular;;  . ICD LEAD REMOVAL N/A 03/30/2015   Procedure: ICD LEAD REMOVAL;  Surgeon: Marzetta Board, MD;  Location: [facility] ORS;  Service: Cardiovascular;  Laterality: N/A;  . IMPLANTABLE CARDIOVERTER DEFIBRILLATOR IMPLANT    . INSERT / REPLACE / REMOVE PACEMAKER    .  LEFT HEART CATHETERIZATION WITH CORONARY ANGIOGRAM N/A 12/09/2014   Procedure: LEFT HEART CATHETERIZATION WITH CORONARY ANGIOGRAM;  Surgeon: Burnell Blanks, MD;  Location: [facility] CATH LAB;  Service: Cardiovascular;  Laterality: N/A;   Family History  Problem Relation Age of Onset  . Hypertension Mother   . Congestive Heart Failure Mother   . Hypertension Sister   . Diabetes Sister   . Pancreatitis Sister   . COPD Sister   . Pancreatitis Brother   . Anemia Neg Hx   . Arrhythmia Neg Hx   . Asthma Neg Hx   . Clotting disorder Neg Hx   . Fainting Neg Hx   . Heart attack Neg Hx   . Heart disease Neg Hx   . Heart failure Neg Hx   . Hyperlipidemia Neg Hx    Social History   Tobacco Use  . Smoking status: Current Every Day Smoker    Packs/day: 0.50    Years: 24.00    Pack years: 12.00    Types: Cigarettes  . Smokeless tobacco: Never Used  . Tobacco comment: 4/5 Smoking 5 cigs a day  Substance Use Topics  . Alcohol use: No   Allergies  Allergen Reactions  . Bidil [Isosorb Dinitrate-Hydralazine] Other (See Comments)    Migraine Headache Migraine Headache  .  Ciprofloxacin      Review of Systems  Constitutional: Positive for fatigue. Negative for appetite change.  HENT: Negative for congestion, postnasal drip and sore throat.   Eyes: Negative.   Respiratory: Positive for cough and wheezing. Negative for chest tightness and shortness of breath.   Cardiovascular: Negative for chest pain, palpitations and leg swelling.  Gastrointestinal: Positive for abdominal pain (at times). Negative for abdominal distention and diarrhea.  Endocrine: Negative.   Genitourinary: Negative.   Musculoskeletal: Positive for arthralgias (right foot pain due to gout). Negative for back pain.  Skin: Negative.   Allergic/Immunologic: Negative.   Neurological: Positive for headaches. Negative for dizziness and light-headedness.  Hematological: Negative for adenopathy. Does not bruise/bleed easily.   Psychiatric/Behavioral: Negative for dysphoric mood, sleep disturbance (sleeping on 4 pillows) and suicidal ideas. The patient is not nervous/anxious.      Physical Exam  Constitutional: He is oriented to person, place, and time. He appears well-developed and well-nourished.  HENT:  Head: Normocephalic and atraumatic.  Neck: Normal range of motion. Neck supple. No JVD present.  Cardiovascular: Normal rate and regular rhythm.  Pulmonary/Chest: Effort normal. He has no wheezes. He has no rales.  Abdominal: Soft. He exhibits no distension. There is no tenderness.  Musculoskeletal: He exhibits no edema or tenderness.  Neurological: He is alert and oriented to person, place, and time.  Skin: Skin is warm and dry.  Psychiatric: He has a normal mood and affect. His behavior is normal. Thought content normal.  Nursing note and vitals reviewed.    Assessment & Plan:  1: Chronic heart failure with reduced ejection fraction- - NYHA class II - euvolemic today - weight gradually declining at home due to pancreatitis. Reminded to call for an overnight weight gain of >2 pounds or a weekly weight gain of >5 pounds.  - weight - tolerating toprol XL without known side effects; consider titrating up - consider titrating up entresto as well - last saw cardiology Oscar [name]) 10/08/17 - bidil gave him migraine headache - per [name], tested O2 sat on room air and the lowest it went to was 93%, then walked patient on room air and he was 100% and then walked patient on 2L of oxygen and he stayed at 100%. Could do another sleep study test if needed.  - does not meet ReDS vest criteria due to BMI - BNP on 05/18/17 was 156.0 - he wants to hold off on echocardiogram until his GI issues get straightened out  2: HTN- - BP  - sees PCP Oscar [name]) at [facility] from 02/04/18 reviewed and shows sodium 137, potassium 3.4 & GFR >60  3: Obstructive sleep apnea- - wearing  his CPAP nightly - continues wearing oxygen at 2-3L   4: Diabetes- - fasting glucose at home was  - lantus is being titrated up by PCP - encouraged him to follow closely with his PCP or ask about a referral to an endocrinologist - A1c from 09/12/16 was 10.9%  5: Tobacco use- - continues to smoke 4 cigarettes daily - does remove himself from the oxygen when smoking - complete cessation discussed for 3 minutes with him  6: Pancreatitis- - says when it flares, that his stomach hurts him so much that he can't really eat and can only keep down jello, freezer pops etc - denies any alcohol use - saw GI provider Ellison Hughs) 03/02/18  Patient did not bring his medications nor a list. Each medication was verbally  reviewed with the patient and he was encouraged to bring the bottles to every visit to confirm accuracy of list.

## 2018-05-04 ENCOUNTER — Ambulatory Visit: Payer: Medicaid Other | Admitting: Family

## 2018-05-04 ENCOUNTER — Telehealth: Payer: Self-pay | Admitting: Family

## 2018-05-04 NOTE — Telephone Encounter (Signed)
Patient did not show for his [facility] appointment on 05/04/18. Will attempt to reschedule.  

## 2018-05-18 ENCOUNTER — Ambulatory Visit: Payer: Medicare Other | Admitting: Family

## 2018-05-18 ENCOUNTER — Emergency Department
Admission: EM | Admit: 2018-05-18 | Discharge: 2018-05-18 | Disposition: A | Discharge status: Home / Self Care | Payer: Medicare Other | Attending: Emergency Medicine | Admitting: Emergency Medicine

## 2018-05-18 ENCOUNTER — Encounter: Payer: Self-pay | Admitting: Emergency Medicine

## 2018-05-18 ENCOUNTER — Other Ambulatory Visit: Payer: Self-pay

## 2018-05-18 ENCOUNTER — Encounter: Payer: Self-pay | Admitting: Family

## 2018-05-18 VITALS — BP 136/99 | HR 86 | Resp 18 | Ht 66.0 in | Wt 288.1 lb

## 2018-05-18 DIAGNOSIS — Z95810 Presence of automatic (implantable) cardiac defibrillator: Secondary | ICD-10-CM | POA: Insufficient documentation

## 2018-05-18 DIAGNOSIS — E785 Hyperlipidemia, unspecified: Secondary | ICD-10-CM | POA: Diagnosis not present

## 2018-05-18 DIAGNOSIS — Z7982 Long term (current) use of aspirin: Secondary | ICD-10-CM | POA: Insufficient documentation

## 2018-05-18 DIAGNOSIS — I420 Dilated cardiomyopathy: Secondary | ICD-10-CM | POA: Insufficient documentation

## 2018-05-18 DIAGNOSIS — I5022 Chronic systolic (congestive) heart failure: Secondary | ICD-10-CM | POA: Insufficient documentation

## 2018-05-18 DIAGNOSIS — I252 Old myocardial infarction: Secondary | ICD-10-CM | POA: Insufficient documentation

## 2018-05-18 DIAGNOSIS — K861 Other chronic pancreatitis: Secondary | ICD-10-CM

## 2018-05-18 DIAGNOSIS — Z794 Long term (current) use of insulin: Secondary | ICD-10-CM | POA: Diagnosis not present

## 2018-05-18 DIAGNOSIS — I2510 Atherosclerotic heart disease of native coronary artery without angina pectoris: Secondary | ICD-10-CM | POA: Diagnosis not present

## 2018-05-18 DIAGNOSIS — Z79899 Other long term (current) drug therapy: Secondary | ICD-10-CM | POA: Insufficient documentation

## 2018-05-18 DIAGNOSIS — R1013 Epigastric pain: Secondary | ICD-10-CM | POA: Diagnosis not present

## 2018-05-18 DIAGNOSIS — Z720 Tobacco use: Secondary | ICD-10-CM

## 2018-05-18 DIAGNOSIS — I110 Hypertensive heart disease with heart failure: Secondary | ICD-10-CM | POA: Diagnosis not present

## 2018-05-18 DIAGNOSIS — F17210 Nicotine dependence, cigarettes, uncomplicated: Secondary | ICD-10-CM | POA: Insufficient documentation

## 2018-05-18 DIAGNOSIS — I10 Essential (primary) hypertension: Secondary | ICD-10-CM

## 2018-05-18 DIAGNOSIS — E119 Type 2 diabetes mellitus without complications: Secondary | ICD-10-CM

## 2018-05-18 DIAGNOSIS — G4733 Obstructive sleep apnea (adult) (pediatric): Secondary | ICD-10-CM

## 2018-05-18 DIAGNOSIS — J45909 Unspecified asthma, uncomplicated: Secondary | ICD-10-CM | POA: Diagnosis not present

## 2018-05-18 LAB — COMPREHENSIVE METABOLIC PANEL
ALT: 59 U/L — AB (ref 0–44)
AST: 50 U/L — ABNORMAL HIGH (ref 15–41)
Albumin: 4.1 g/dL (ref 3.5–5.0)
Alkaline Phosphatase: 318 U/L — ABNORMAL HIGH (ref 38–126)
Anion gap: 11 (ref 5–15)
BUN: 12 mg/dL (ref 6–20)
CHLORIDE: 104 mmol/L (ref 98–111)
CO2: 23 mmol/L (ref 22–32)
CREATININE: 1.01 mg/dL (ref 0.61–1.24)
Calcium: 9.2 mg/dL (ref 8.9–10.3)
GFR calc non Af Amer: >60 mL/min (ref >60)
Glucose, Bld: 127 mg/dL — ABNORMAL HIGH (ref 70–99)
Potassium: 4 mmol/L (ref 3.5–5.1)
SODIUM: 138 mmol/L (ref 135–145)
Total Bilirubin: 0.8 mg/dL (ref 0.3–1.2)
Total Protein: 8.6 g/dL — ABNORMAL HIGH (ref 6.5–8.1)

## 2018-05-18 LAB — CBC
HCT: 43.3 % (ref 40.0–52.0)
HEMOGLOBIN: 15.8 g/dL (ref 13.0–18.0)
MCH: 34.4 pg — AB (ref 26.0–34.0)
MCHC: 36.5 g/dL — ABNORMAL HIGH (ref 32.0–36.0)
MCV: 94.3 fL (ref 80.0–100.0)
PLATELETS: 194 10*3/uL (ref 150–440)
RBC: 4.59 MIL/uL (ref 4.40–5.90)
RDW: 12.9 % (ref 11.5–14.5)
WBC: 4.9 10*3/uL (ref 3.8–10.6)

## 2018-05-18 LAB — URINALYSIS, COMPLETE (UACMP) WITH MICROSCOPIC
Bacteria, UA: NONE SEEN
Bilirubin Urine: NEGATIVE
Glucose, UA: NEGATIVE mg/dL
Hgb urine dipstick: NEGATIVE
Ketones, ur: NEGATIVE mg/dL
Leukocytes, UA: NEGATIVE
Nitrite: NEGATIVE
PH: 5 (ref 5.0–8.0)
Protein, ur: NEGATIVE mg/dL
SPECIFIC GRAVITY, URINE: 1.004 — AB (ref 1.005–1.030)

## 2018-05-18 LAB — LIPASE, BLOOD: LIPASE: 59 U/L — AB (ref 11–51)

## 2018-05-18 MED ORDER — FAMOTIDINE 20 MG PO TABS: 20.0000 mg | ORAL_TABLET | Freq: Two times a day (BID) | ORAL | 0 refills | Status: DC

## 2018-05-18 MED ORDER — ONDANSETRON 8 MG PO TBDP: 8.0000 mg | ORAL_TABLET | Freq: Three times a day (TID) | ORAL | 0 refills | Status: DC | PRN

## 2018-05-18 MED ORDER — ALUMINUM-MAGNESIUM-SIMETHICONE 200-200-20 MG/5ML PO SUSP: 30.0000 mL | Freq: Three times a day (TID) | ORAL | 0 refills | Status: DC

## 2018-05-18 MED ORDER — SODIUM CHLORIDE 0.9 % IV BOLUS: 1000.0000 mL | Freq: Once | INTRAVENOUS | Status: DC

## 2018-05-18 NOTE — ED Provider Notes (Signed)
[facility] Emergency Department Provider Note  ____________________________________________  Time seen: Approximately 3:40 PM  I have reviewed the triage vital signs and the nursing notes.   HISTORY  Chief Complaint Abdominal Pain    HPI Oscar [name] is a 42 y.o. male with a history of CHF, diabetes, hypertension who complains of epigastric abdominal pain for the past 5 days, gradual onset, constant, waxing and waning and worse with eating, no alleviating factors.  Nonradiating.  Associated with nausea.  Feels like pancreatitis that he had in the past.  Compliant with his medications.  Difficulty with oral intake over the past several days and difficulty with hydration.  No dizziness chest pain or shortness of breath.  No syncope.      Past Medical History:  Diagnosis Date  . AICD (automatic cardioverter/defibrillator) present   . Asthma   . Cardiomyopathy ([place])   . CHF (congestive heart failure) ([place])   . Coronary artery disease   . Deafness in right ear   . Diabetes mellitus without complication ([place])   . Dilated cardiomyopathy ([place])   . Dysrhythmia    svt  . 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 GERD (gastroesophageal reflux disease)   . Hyperlipidemia   . Hypertension   . Hypoxemia   . Hypoxemia   . Mild obesity   . Myocardial infarction ([place])    [id number],7/03  . Pancreatitis   . Sleep apnea    osa     Patient Active Problem List   Diagnosis Date Noted  . Acute pancreatitis 05/19/2017  . Pancreatitis 09/12/2016  . Bronchitis 05/20/2016  . Constipation 11/08/2015  . Abdominal pain 11/02/2015  . Obstructive sleep apnea 07/31/2015  . Tobacco use 07/31/2015  . Diabetes ([place]) 07/10/2015  . Chronic systolic heart failure ([place]) 03/30/2015  . Cardiac pacemaker 03/30/2015  . Chronic respiratory failure with hypercapnia ([place]) 12/12/2014  . Elevated troponin   . Non-ischemic cardiomyopathy ([place])   . Hypoxemia   . SVT  (supraventricular tachycardia) ([place]) 12/09/2014  . NSTEMI (non-ST elevated myocardial infarction) ([place]) 12/09/2014  . Hypertension 12/09/2014     Past Surgical History:  Procedure Laterality Date  . CARDIAC CATHETERIZATION  12/11/2014   Procedure: RIGHT/LEFT HEART CATH AND CORONARY ANGIOGRAPHY;  Surgeon: Lorretta Harp, MD;  Location[facility] CATH LAB;  Service: Cardiovascular;;  . ICD LEAD REMOVAL N/A 03/30/2015   Procedure: ICD LEAD REMOVAL;  Surgeon: Marzetta Board, MD;  Location: [facility] ORS;  Service: Cardiovascular;  Laterality: N/A;  . IMPLANTABLE CARDIOVERTER DEFIBRILLATOR IMPLANT    . INSERT / REPLACE / REMOVE PACEMAKER    . LEFT HEART CATHETERIZATION WITH CORONARY ANGIOGRAM N/A 12/09/2014   Procedure: LEFT HEART CATHETERIZATION WITH CORONARY ANGIOGRAM;  Surgeon: Burnell Blanks, MD;  Location: [facility] CATH LAB;  Service: Cardiovascular;  Laterality: N/A;     Prior to Admission medications   Medication Sig Start Date End Date Taking? Authorizing Provider  aspirin EC 325 MG tablet Take 1 tablet (325 mg total) by mouth daily. 07/10/15  Yes Aldean Jewett, MD  atorvastatin (LIPITOR) 40 MG tablet Take 1 tablet (40 mg total) by mouth daily at 6 PM. 09/13/16  Yes Gouru, Aruna, MD  ENTRESTO 49-51 MG TAKE 1 TABLET BY MOUTH TWO TIMES DAILY 03/05/18  Yes Darylene Price A, FNP  gabapentin (NEURONTIN) 300 MG capsule Take 300 mg by mouth 2 (two) times daily.   Yes [provider]  insulin glargine (LANTUS) 100 UNIT/ML injection Inject  0.3 mLs (30 Units total) into the skin at bedtime. Patient taking differently: Inject 30 Units into the skin at bedtime. Takes 40 units twice daily 05/20/17  Yes Epifanio Lesches, MD  insulin starter kit- syringes MISC 1 kit by Other route once. 07/10/15  Yes Aldean Jewett, MD  omeprazole (PRILOSEC) 20 MG capsule Take 1 capsule (20 mg total) by mouth daily. 04/01/16  Yes McGowan, Larene Beach A, PA-C  spironolactone (ALDACTONE) 25 MG tablet TAKE 1 TABLET BY  MOUTH EVERY DAY. 02/17/18  Yes Hackney, Tina A, FNP  Syringe, Disposable, 1 ML MISC 0.1 mLs by Does not apply route as directed. 08/21/15  Yes Darylene Price A, FNP  tiotropium (SPIRIVA) 18 MCG inhalation capsule Place 18 mcg into inhaler and inhale daily.   Yes [provider]  torsemide (DEMADEX) 20 MG tablet TAKE 1 TABLET BY MOUTH EVERY DAY. 02/17/18  Yes Darylene Price A, FNP  aluminum-magnesium hydroxide-simethicone (MAALOX) 245-809-98 MG/5ML SUSP Take 30 mLs by mouth 4 (four) times daily -  before meals and at bedtime. 05/18/18   Carrie Mew, MD  famotidine (PEPCID) 20 MG tablet Take 1 tablet (20 mg total) by mouth 2 (two) times daily. 05/18/18   Carrie Mew, MD  metoprolol succinate (TOPROL-XL) 25 MG 24 hr tablet TAKE ONE TABLET BY MOUTH DAILY 04/15/18   Darylene Price A, FNP  ondansetron (ZOFRAN ODT) 8 MG disintegrating tablet Take 1 tablet (8 mg total) by mouth every 8 (eight) hours as needed for nausea or vomiting. 05/18/18   Carrie Mew, MD     Allergies Bidil [isosorb dinitrate-hydralazine] and Ciprofloxacin   Family History  Problem Relation Age of Onset  . Hypertension Mother   . Congestive Heart Failure Mother   . Hypertension Sister   . Diabetes Sister   . Pancreatitis Sister   . COPD Sister   . Pancreatitis Brother   . Anemia Neg Hx   . Arrhythmia Neg Hx   . Asthma Neg Hx   . Clotting disorder Neg Hx   . Fainting Neg Hx   . Heart attack Neg Hx   . Heart disease Neg Hx   . Heart failure Neg Hx   . Hyperlipidemia Neg Hx     Social History Social History   Tobacco Use  . Smoking status: Current Every Day Smoker    Packs/day: 0.50    Years: 24.00    Pack years: 12.00    Types: Cigarettes  . Smokeless tobacco: Never Used  . Tobacco comment: 4/5 Smoking 5 cigs a day  Substance Use Topics  . Alcohol use: No  . Drug use: No    Review of Systems  Constitutional:   No fever or chills.  ENT:   No sore throat. No  rhinorrhea. Cardiovascular:   No chest pain or syncope. Respiratory:   No dyspnea or cough. Gastrointestinal: Positive as above for abdominal pain without vomiting and diarrhea.  Musculoskeletal:   Negative for focal pain or swelling All other systems reviewed and are negative except as documented above in ROS and HPI.  ____________________________________________   PHYSICAL EXAM:  VITAL SIGNS: ED Triage Vitals  Enc Vitals Group     BP 05/18/18 1252 (!) 142/92     Pulse Rate 05/18/18 1250 78     Resp 05/18/18 1250 16     Temp 05/18/18 1250 97.6 F (36.4 C)     Temp Source 05/18/18 1250 Oral     SpO2 05/18/18 1250 98 %     Weight  05/18/18 1250 288 lb (130.6 kg)     Height 05/18/18 1250 _0  (1.676 m)     Head Circumference --      Peak Flow --      Pain Score 05/18/18 1250 6     Pain Loc --      Pain Edu? --      Excl. in [place]? --     Vital signs reviewed, nursing assessments reviewed.   Constitutional:   Alert and oriented. Non-toxic appearance.  Morbidly obese Eyes:   Conjunctivae are normal. EOMI. PERRL. ENT      Head:   Normocephalic and atraumatic.      Nose:   No congestion/rhinnorhea.       Mouth/Throat:   Dry mucous membranes, no pharyngeal erythema. No peritonsillar mass.       Neck:   No meningismus. Full ROM. Hematological/Lymphatic/Immunilogical:   No cervical lymphadenopathy. Cardiovascular:   RRR. Symmetric bilateral radial and DP pulses.  No murmurs. Cap refill less than 2 seconds. Respiratory:   Normal respiratory effort without tachypnea/retractions. Breath sounds are clear and equal bilaterally. No wheezes/rales/rhonchi. Gastrointestinal:   Soft with epigastric tenderness.  Non distended. There is no CVA tenderness.  No rebound, rigidity, or guarding. Musculoskeletal:   Normal range of motion in all extremities. No joint effusions.  No lower extremity tenderness.  No edema. Neurologic:   Normal speech and language.  Motor grossly intact. No acute  focal neurologic deficits are appreciated.  Patient has medical decision-making capacity Skin:    Skin is warm, dry and intact. No rash noted.  No petechiae, purpura, or bullae.  ____________________________________________    LABS (pertinent positives/negatives) (all labs ordered are listed, but only abnormal results are displayed) Labs Reviewed  LIPASE, BLOOD - Abnormal; Notable for the following components:      Result Value   Lipase 59 (*)    All other components within normal limits  COMPREHENSIVE METABOLIC PANEL - Abnormal; Notable for the following components:   Glucose, Bld 127 (*)    Total Protein 8.6 (*)    AST 50 (*)    ALT 59 (*)    Alkaline Phosphatase 318 (*)    All other components within normal limits  CBC - Abnormal; Notable for the following components:   MCH 34.4 (*)    MCHC 36.5 (*)    All other components within normal limits  URINALYSIS, COMPLETE (UACMP) WITH MICROSCOPIC - Abnormal; Notable for the following components:   Color, Urine STRAW (*)    APPearance CLEAR (*)    Specific Gravity, Urine 1.004 (*)    All other components within normal limits   ____________________________________________   EKG    ____________________________________________    RADIOLOGY  No results found.  ____________________________________________   PROCEDURES Procedures  ____________________________________________  DIFFERENTIAL DIAGNOSIS   Pancreatitis, choledocholithiasis, pancreatic mass, bowel obstruction, diverticulitis  CLINICAL IMPRESSION / ASSESSMENT AND PLAN / ED COURSE  Pertinent labs & imaging results that were available during my care of the patient were reviewed by me and considered in my medical decision making (see chart for details).    Patient presents with upper abdominal pain, constant x5 days.  Doubt cholecystitis AAA dissection or mesenteric ischemia.  Patient is not septic.  Vital signs are normal, but with his morbid obesity and  diabetes CT scan is needed to evaluate for an occult process.  Clinical Course as of May 18 1548  Tue May 18, 2018  1546 Patient initially agreed to my recommended  work-up plan including IV fluids for hydration and CT scan.  However, he now reports that he needs to catch a transport service home and closes at 5:00 and he does not have time to stay.  He feels comfortable leaving and does note that he has an appointment with gastroenterology coming up and just requests an antibiotic and some pain medicine.  Currently I do not see any clear evidence of infection and antibiotics are not warranted at this time.  I will recommend an acids and antiemetics.  Return precautions were discussed and patient will come back if he changes his mind or has any worsening symptoms including fever intractable vomiting or severe pain.   [PS]    Clinical Course User Index [PS] Carrie Mew, MD     ----------------------------------------- 3:50 PM on 05/18/2018 -----------------------------------------  Patient discharge [place], not in distress.  ____________________________________________   FINAL CLINICAL IMPRESSION(S) / ED DIAGNOSES    Final diagnoses:  Epigastric pain     ED Discharge Orders         Ordered    ondansetron (ZOFRAN ODT) 8 MG disintegrating tablet  Every 8 hours PRN     05/18/18 1549    famotidine (PEPCID) 20 MG tablet  2 times daily     05/18/18 1549    aluminum-magnesium hydroxide-simethicone (MAALOX) 200-200-20 MG/5ML SUSP  3 times daily before meals & bedtime     05/18/18 1549          Portions of this note were generated with dragon dictation software. Dictation errors may occur despite best attempts at proofreading.    Carrie Mew, MD 05/18/18 1550

## 2018-05-18 NOTE — Progress Notes (Signed)
Patient ID: Oscar [name], male    DOB: 1976/01/25, 42 y.o.   MRN: [id number]  HPI  Oscar [name] is a 42 y/o male with a history of obstructive sleep apnea (currently with CPAP), MI, HTN, hyperlipidemia, GERD, DM, SVT, asthma and chronic heart failure.  Last echo was done 02/06/15 which showed an EF of 25% which is unchanged from previous echo on 12/10/14 which showed an EF of 25% and mild Oscar.   Was in the ED 03/27/18 due to left hip pain where he was treated and released. Was in the ED 02/17/18 due to acute gout of right foot. Medications were given and he was released. Was in the ED 02/08/18 due to acute pancreatitis where he was treated and released. Was in the ED 02/04/18 due to right acute serous otitis media. Given ear drops and was released.   He presents today for a follow-up visit with a chief complaint of minimal fatigue upon moderate exertion. He describes this as being present for several years. He has associated cough, wheezing and abdominal pain along with this. He denies any difficulty sleeping, abdominal distention, palpitations, pedal edema, chest pain, shortness of breath, dizziness or weight gain. In fact, he continues to lose weight due to his abdominal pain/pancreatitis. Has GI follow-up scheduled early October.   Past Medical History:  Diagnosis Date  . AICD (automatic cardioverter/defibrillator) present   . Asthma   . Cardiomyopathy ([place])   . CHF (congestive heart failure) ([place])   . Coronary artery disease   . Deafness in right ear   . Diabetes mellitus without complication ([place])   . Dilated cardiomyopathy ([place])   . Dysrhythmia    svt  . 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 GERD (gastroesophageal reflux disease)   . Hyperlipidemia   . Hypertension   . Hypoxemia   . Hypoxemia   . Mild obesity   . Myocardial infarction ([place])    0045,9977,4/14  . Pancreatitis   . Sleep apnea    osa   Past Surgical History:  Procedure Laterality Date  . CARDIAC  CATHETERIZATION  12/11/2014   Procedure: RIGHT/LEFT HEART CATH AND CORONARY ANGIOGRAPHY;  Surgeon: Lorretta Harp, MD;  Location: [facility] CATH LAB;  Service: Cardiovascular;;  . ICD LEAD REMOVAL N/A 03/30/2015   Procedure: ICD LEAD REMOVAL;  Surgeon: Marzetta Board, MD;  Location: [facility] ORS;  Service: Cardiovascular;  Laterality: N/A;  . IMPLANTABLE CARDIOVERTER DEFIBRILLATOR IMPLANT    . INSERT / REPLACE / REMOVE PACEMAKER    . LEFT HEART CATHETERIZATION WITH CORONARY ANGIOGRAM N/A 12/09/2014   Procedure: LEFT HEART CATHETERIZATION WITH CORONARY ANGIOGRAM;  Surgeon: Burnell Blanks, MD;  Location: [facility] CATH LAB;  Service: Cardiovascular;  Laterality: N/A;   Family History  Problem Relation Age of Onset  . Hypertension Mother   . Congestive Heart Failure Mother   . Hypertension Sister   . Diabetes Sister   . Pancreatitis Sister   . COPD Sister   . Pancreatitis Brother   . Anemia Neg Hx   . Arrhythmia Neg Hx   . Asthma Neg Hx   . Clotting disorder Neg Hx   . Fainting Neg Hx   . Heart attack Neg Hx   . Heart disease Neg Hx   . Heart failure Neg Hx   . Hyperlipidemia Neg Hx    Social History   Tobacco Use  . Smoking status: Current Every Day Smoker    Packs/day: 0.50  Years: 24.00    Pack years: 12.00    Types: Cigarettes  . Smokeless tobacco: Never Used  . Tobacco comment: 4/5 Smoking 5 cigs a day  Substance Use Topics  . Alcohol use: No   Allergies  Allergen Reactions  . Bidil [Isosorb Dinitrate-Hydralazine] Other (See Comments)    Migraine Headache Migraine Headache  . Ciprofloxacin    Prior to Admission medications   Medication Sig Start Date End Date Taking? Authorizing Provider  aspirin EC 325 MG tablet Take 1 tablet (325 mg total) by mouth daily. 07/10/15  Yes Aldean Jewett, MD  atorvastatin (LIPITOR) 40 MG tablet Take 1 tablet (40 mg total) by mouth daily at 6 PM. 09/13/16  Yes Gouru, Aruna, MD  ENTRESTO 49-51 MG TAKE 1 TABLET BY MOUTH TWO TIMES DAILY 03/05/18   Yes Darylene Price A, FNP  gabapentin (NEURONTIN) 300 MG capsule Take 300 mg by mouth 2 (two) times daily.   Yes [provider]  insulin glargine (LANTUS) 100 UNIT/ML injection Inject 0.3 mLs (30 Units total) into the skin at bedtime. Patient taking differently: Inject 30 Units into the skin at bedtime. Takes 40 units twice daily 05/20/17  Yes Epifanio Lesches, MD  insulin starter kit- syringes MISC 1 kit by Other route once. 07/10/15  Yes Aldean Jewett, MD  metoprolol succinate (TOPROL-XL) 25 MG 24 hr tablet TAKE ONE TABLET BY MOUTH DAILY 04/15/18  Yes Darylene Price A, FNP  omeprazole (PRILOSEC) 20 MG capsule Take 1 capsule (20 mg total) by mouth daily. 04/01/16  Yes McGowan, Larene Beach A, PA-C  spironolactone (ALDACTONE) 25 MG tablet TAKE 1 TABLET BY MOUTH EVERY DAY. 02/17/18  Yes Hackney, Tina A, FNP  Syringe, Disposable, 1 ML MISC 0.1 mLs by Does not apply route as directed. 08/21/15  Yes Darylene Price A, FNP  tiotropium (SPIRIVA) 18 MCG inhalation capsule Place 18 mcg into inhaler and inhale daily.   Yes [provider]  torsemide (DEMADEX) 20 MG tablet TAKE 1 TABLET BY MOUTH EVERY DAY. 02/17/18  Yes Darylene Price A, FNP  aluminum-magnesium hydroxide-simethicone (MAALOX) 017-510-25 MG/5ML SUSP Take 30 mLs by mouth 4 (four) times daily -  before meals and at bedtime. 05/18/18   Carrie Mew, MD  famotidine (PEPCID) 20 MG tablet Take 1 tablet (20 mg total) by mouth 2 (two) times daily. 05/18/18   Carrie Mew, MD  ondansetron (ZOFRAN ODT) 8 MG disintegrating tablet Take 1 tablet (8 mg total) by mouth every 8 (eight) hours as needed for nausea or vomiting. 05/18/18   Carrie Mew, MD    Review of Systems  Constitutional: Positive for fatigue. Negative for appetite change.  HENT: Negative for congestion, postnasal drip and sore throat.   Eyes: Negative.   Respiratory: Positive for cough and wheezing ("little bit"). Negative for chest tightness and shortness of  breath.   Cardiovascular: Negative for chest pain, palpitations and leg swelling.  Gastrointestinal: Positive for abdominal pain (at times). Negative for abdominal distention, diarrhea and nausea.  Endocrine: Negative.   Genitourinary: Negative.   Musculoskeletal: Negative for arthralgias and back pain.  Skin: Negative.   Allergic/Immunologic: Negative.   Neurological: Negative for dizziness, light-headedness and headaches.  Hematological: Negative for adenopathy. Does not bruise/bleed easily.  Psychiatric/Behavioral: Negative for dysphoric mood, sleep disturbance (sleeping on 4 pillows) and suicidal ideas. The patient is not nervous/anxious.    Vitals:   05/18/18 1213  BP: (!) 136/99  Pulse: 86  Resp: 18  SpO2: 97%  Weight: 288 lb 2 oz (  130.7 kg)  Height: _0  (1.676 m)   Wt Readings from Last 3 Encounters:  05/18/18 288 lb 2 oz (130.7 kg)  03/27/18 293 lb (132.9 kg)  03/05/18 298 lb (135.2 kg)   Lab Results  Component Value Date   CREATININE 1.13 02/04/2018   CREATININE 1.59 (H) 08/20/2017   CREATININE 1.10 08/13/2017     Physical Exam  Constitutional: He is oriented to person, place, and time. He appears well-developed and well-nourished.  HENT:  Head: Normocephalic and atraumatic.  Neck: Normal range of motion. Neck supple. No JVD present.  Cardiovascular: Normal rate and regular rhythm.  Pulmonary/Chest: Effort normal. He has no wheezes. He has no rales.  Abdominal: Soft. He exhibits no distension. There is no tenderness.  Musculoskeletal: He exhibits no edema or tenderness.  Neurological: He is alert and oriented to person, place, and time.  Skin: Skin is warm and dry.  Psychiatric: He has a normal mood and affect. His behavior is normal. Thought content normal.  Nursing note and vitals reviewed.    Assessment & Plan:  1: Chronic heart failure with reduced ejection fraction- - NYHA class II - euvolemic today - weight gradually declining at home due to  pancreatitis. Reminded to call for an overnight weight gain of >2 pounds or a weekly weight gain of >5 pounds.  - weight down 10 pounds since he was last here 2 months ago - tolerating toprol XL without known side effects; consider titrating up - consider titrating up entresto once his GI issues get settled - last saw cardiology [name]) 10/08/17 - bidil gave him migraine headache - does not meet ReDS vest criteria due to BMI - BNP on 05/18/17 was 156.0 - he wants to hold off on echocardiogram until his GI issues get straightened out  2: HTN- - BP mildly elevated today but he says that his abdomen is hurting some today - sees PCP [name]) at [place] from 02/04/18 reviewed and shows sodium 137, potassium 3.4 & GFR >60  3: Obstructive sleep apnea- - wearing his CPAP nightly - continues wearing oxygen at 2-3L PRN   4: Diabetes- - fasting glucose at home was 116 - lantus is being titrated up by PCP - encouraged him to follow closely with his PCP or ask about a referral to an endocrinologist - A1c from 09/12/16 was 10.9%  5: Tobacco use- - continues to smoke 4 cigarettes daily - does remove himself from the oxygen when smoking - complete cessation discussed for 3 minutes with him  6: Pancreatitis- - says when it flares, that his stomach hurts him so much that he can't really eat and can only keep down jello, freezer pops etc - denies any alcohol use - saw GI provider Ellison Hughs) 03/02/18  Medication list was reviewed.   Return in 6 months or sooner for any questions/problems before then.

## 2018-05-18 NOTE — ED Notes (Signed)
Says uppper mid abd pain that is worse after eating. Started about Thursday.  Says feel like pancreatitis which he has ahistory of.  Says has been constipated some.  Went yesterday.  No burnign with urination.

## 2018-05-18 NOTE — Patient Instructions (Signed)
Continue weighing daily and call for an overnight weight gain of > 2 pounds or a weekly weight gain of >5 pounds. 

## 2018-05-18 NOTE — ED Notes (Signed)
Pt states he does not want an IV, informed pt that even if he does not want the IV fluids that it is required in order to perform the CT, states he is suppose to see GI specialist next week and have all that done then. EDP notified and in to speak with pt.

## 2018-05-18 NOTE — ED Triage Notes (Signed)
Pt to ED via POV with c/o abd pain x2wks, states hx of pancreatitis. Denies n/v/d. Pt ambulatory, NAD , VSS

## 2018-05-19 ENCOUNTER — Encounter: Payer: Self-pay | Admitting: Family

## 2018-06-10 NOTE — Progress Notes (Signed)
Patient ID: Oscar [name], male    DOB: October 20, 1975, 42 y.o.   MRN: [id number]  HPI  Oscar [name] is a 42 y/o male with a history of obstructive sleep apnea (currently with CPAP), MI, HTN, hyperlipidemia, GERD, DM, SVT, asthma and chronic heart failure.  Last echo was done 02/06/15 which showed an EF of 25% which is unchanged from previous echo on 12/10/14 which showed an EF of 25% and mild Oscar.   Was in the ED 05/18/18 due to epigastric pain where he was treated and released. Was in the ED 03/27/18 due to left hip pain where he was treated and released. Was in the ED 02/17/18 due to acute gout of right foot. Medications were given and he was released. Was in the ED 02/08/18 due to acute pancreatitis where he was treated and released. Was in the ED 02/04/18 due to right acute serous otitis media. Given ear drops and was released.   He presents today for a follow-up visit with a chief complaint of minimal fatigue upon moderate exertion. He describes this as chronic in nature having been present for several years. He does feel like his fatigue has improved since he stopped the entresto on his own. He has associated cough and abdominal pain along with this. He denies any difficulty sleeping, abdominal distention, palpitations, pedal edema, chest pain, shortness of breath, dizziness or weight gain. He says that he's been off the entresto for the last 4 days.   Past Medical History:  Diagnosis Date  . AICD (automatic cardioverter/defibrillator) present   . Asthma   . Cardiomyopathy ([place])   . CHF (congestive heart failure) ([place])   . Coronary artery disease   . Deafness in right ear   . Diabetes mellitus without complication ([place])   . Dilated cardiomyopathy ([place])   . Dysrhythmia    svt  . 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 GERD (gastroesophageal reflux disease)   . Hyperlipidemia   . Hypertension   . Hypoxemia   . Hypoxemia   . Mild obesity   . Myocardial infarction ([place])     8101,7510,2/58  . Pancreatitis   . Sleep apnea    osa   Past Surgical History:  Procedure Laterality Date  . CARDIAC CATHETERIZATION  12/11/2014   Procedure: RIGHT/LEFT HEART CATH AND CORONARY ANGIOGRAPHY;  Surgeon: Lorretta Harp, MD;  Location: [facility] CATH LAB;  Service: Cardiovascular;;  . ICD LEAD REMOVAL N/A 03/30/2015   Procedure: ICD LEAD REMOVAL;  Surgeon: Marzetta Board, MD;  Location: [facility] ORS;  Service: Cardiovascular;  Laterality: N/A;  . IMPLANTABLE CARDIOVERTER DEFIBRILLATOR IMPLANT    . INSERT / REPLACE / REMOVE PACEMAKER    . LEFT HEART CATHETERIZATION WITH CORONARY ANGIOGRAM N/A 12/09/2014   Procedure: LEFT HEART CATHETERIZATION WITH CORONARY ANGIOGRAM;  Surgeon: Burnell Blanks, MD;  Location: [facility] CATH LAB;  Service: Cardiovascular;  Laterality: N/A;   Family History  Problem Relation Age of Onset  . Hypertension Mother   . Congestive Heart Failure Mother   . Hypertension Sister   . Diabetes Sister   . Pancreatitis Sister   . COPD Sister   . Pancreatitis Brother   . Anemia Neg Hx   . Arrhythmia Neg Hx   . Asthma Neg Hx   . Clotting disorder Neg Hx   . Fainting Neg Hx   . Heart attack Neg Hx   . Heart disease Neg Hx   . Heart failure Neg Hx   .  Hyperlipidemia Neg Hx    Social History   Tobacco Use  . Smoking status: Current Every Day Smoker    Packs/day: 0.50    Years: 24.00    Pack years: 12.00    Types: Cigarettes  . Smokeless tobacco: Never Used  . Tobacco comment: 4/5 Smoking 5 cigs a day  Substance Use Topics  . Alcohol use: No   Allergies  Allergen Reactions  . Bidil [Isosorb Dinitrate-Hydralazine] Other (See Comments)    Migraine Headache Migraine Headache  . Ciprofloxacin    Prior to Admission medications   Medication Sig Start Date End Date Taking? Authorizing Provider  aluminum-magnesium hydroxide-simethicone (MAALOX) 786-767-20 MG/5ML SUSP Take 30 mLs by mouth 4 (four) times daily -  before meals and at bedtime. 05/18/18  Yes  Carrie Mew, MD  aspirin EC 325 MG tablet Take 1 tablet (325 mg total) by mouth daily. 07/10/15  Yes Aldean Jewett, MD  atorvastatin (LIPITOR) 40 MG tablet Take 1 tablet (40 mg total) by mouth daily at 6 PM. 09/13/16  Yes Gouru, Aruna, MD  famotidine (PEPCID) 20 MG tablet Take 1 tablet (20 mg total) by mouth 2 (two) times daily. 05/18/18  Yes Carrie Mew, MD  gabapentin (NEURONTIN) 300 MG capsule Take 300 mg by mouth 2 (two) times daily.   Yes [provider]  insulin glargine (LANTUS) 100 UNIT/ML injection Inject 0.3 mLs (30 Units total) into the skin at bedtime. 05/20/17  Yes Epifanio Lesches, MD  insulin starter kit- syringes MISC 1 kit by Other route once. 07/10/15  Yes Aldean Jewett, MD  metoprolol succinate (TOPROL-XL) 25 MG 24 hr tablet TAKE ONE TABLET BY MOUTH DAILY 04/15/18  Yes Darylene Price A, FNP  omeprazole (PRILOSEC) 20 MG capsule Take 1 capsule (20 mg total) by mouth daily. 04/01/16  Yes McGowan, Larene Beach A, PA-C  ondansetron (ZOFRAN ODT) 8 MG disintegrating tablet Take 1 tablet (8 mg total) by mouth every 8 (eight) hours as needed for nausea or vomiting. 05/18/18  Yes Carrie Mew, MD  spironolactone (ALDACTONE) 25 MG tablet TAKE 1 TABLET BY MOUTH EVERY DAY. 02/17/18  Yes Konstance Happel A, FNP  Syringe, Disposable, 1 ML MISC 0.1 mLs by Does not apply route as directed. 08/21/15  Yes Darylene Price A, FNP  tiotropium (SPIRIVA) 18 MCG inhalation capsule Place 18 mcg into inhaler and inhale daily.   Yes [provider]  torsemide (DEMADEX) 20 MG tablet TAKE 1 TABLET BY MOUTH EVERY DAY. 02/17/18  Yes Alisa Graff, FNP  ENTRESTO 49-51 MG TAKE 1 TABLET BY MOUTH TWO TIMES DAILY Patient not taking: Reported on 06/15/2018 03/05/18   Alisa Graff, FNP     Review of Systems  Constitutional: Positive for fatigue (better without entresto). Negative for appetite change.  HENT: Negative for congestion, postnasal drip and sore throat.   Eyes: Negative.    Respiratory: Positive for cough. Negative for chest tightness, shortness of breath and wheezing.   Cardiovascular: Negative for chest pain, palpitations and leg swelling.  Gastrointestinal: Positive for abdominal pain (at times). Negative for abdominal distention, diarrhea and nausea.  Endocrine: Negative.   Genitourinary: Negative.   Musculoskeletal: Negative for arthralgias and back pain.  Skin: Negative.   Allergic/Immunologic: Negative.   Neurological: Negative for dizziness, light-headedness and headaches.  Hematological: Negative for adenopathy. Does not bruise/bleed easily.  Psychiatric/Behavioral: Negative for dysphoric mood, sleep disturbance (sleeping on 4 pillows) and suicidal ideas. The patient is not nervous/anxious.    Vitals:   06/15/18 0932  BP: (!) 146/88  Pulse: 78  Resp: 18  SpO2: 98%  Weight: 287 lb 2 oz (130.2 kg)  Height: 5' 6"  (1.676 m)   Wt Readings from Last 3 Encounters:  06/15/18 287 lb 2 oz (130.2 kg)  05/18/18 288 lb (130.6 kg)  05/18/18 288 lb 2 oz (130.7 kg)   Lab Results  Component Value Date   CREATININE 1.01 05/18/2018   CREATININE 1.13 02/04/2018   CREATININE 1.59 (H) 08/20/2017   Physical Exam  Constitutional: He is oriented to person, place, and time. He appears well-developed and well-nourished.  HENT:  Head: Normocephalic and atraumatic.  Neck: Normal range of motion. Neck supple. No JVD present.  Cardiovascular: Normal rate and regular rhythm.  Pulmonary/Chest: Effort normal. He has no wheezes. He has no rales.  Abdominal: Soft. He exhibits no distension. There is no tenderness.  Musculoskeletal: He exhibits no edema or tenderness.  Neurological: He is alert and oriented to person, place, and time.  Skin: Skin is warm and dry.  Psychiatric: He has a normal mood and affect. His behavior is normal. Thought content normal.  Nursing note and vitals reviewed.    Assessment & Plan:  1: Chronic heart failure with reduced ejection  fraction- - NYHA class II - euvolemic today - weighing daily; Reminded to call for an overnight weight gain of >2 pounds or a weekly weight gain of >5 pounds.  - weight essentially unchanged from last visit here - tolerating toprol XL without known side effects; consider titrating up - discussed the potential benefits of entresto and patient is willing to resume entresto but will do so at a lower dose. He is to break his current dose in half and take 1/2 tablet twice daily (=24/70m twice daily) - last saw cardiology ([name] 10/08/17 - bidil gave him migraine headache - does not meet ReDS vest criteria due to BMI - BNP on 05/18/17 was 156.0 - have scheduled echo for next week as we need to know if EF continues to be low  2: HTN- - BP mildly elevated today; resuming entresto per above - [name] reviewed and shows sodium 138, potassium 4.0, creatinine 1.01 & GFR >60  3: Obstructive sleep apnea- - wearing his CPAP nightly - continues wearing oxygen at 2-3L    4: Diabetes- - fasting glucose at home was 123 - lantus is being titrated up by PCP - A1c from 09/12/16 was 10.9%  5: Tobacco use- - continues to smoke 1/2 ppd cigarettes daily - does remove himself from the oxygen when smoking - complete cessation discussed for 3 minutes with him  6: Pancreatitis- - says when it flares, that his stomach hurts him so much that he can't really eat and can only keep down jello, freezer pops etc - denies any alcohol use - saw GI provider (Ellison Hughs 03/02/18  Medication list was reviewed.   Return in 5 months or sooner for any questions/problems before then.

## 2018-06-15 ENCOUNTER — Ambulatory Visit: Payer: Medicare Other | Attending: Family | Admitting: Family

## 2018-06-15 ENCOUNTER — Encounter: Payer: Self-pay | Admitting: Family

## 2018-06-15 VITALS — BP 146/88 | HR 78 | Resp 18 | Ht 66.0 in | Wt 287.1 lb

## 2018-06-15 DIAGNOSIS — Z720 Tobacco use: Secondary | ICD-10-CM

## 2018-06-15 DIAGNOSIS — I2510 Atherosclerotic heart disease of native coronary artery without angina pectoris: Secondary | ICD-10-CM | POA: Diagnosis not present

## 2018-06-15 DIAGNOSIS — J45909 Unspecified asthma, uncomplicated: Secondary | ICD-10-CM | POA: Insufficient documentation

## 2018-06-15 DIAGNOSIS — E119 Type 2 diabetes mellitus without complications: Secondary | ICD-10-CM

## 2018-06-15 DIAGNOSIS — E669 Obesity, unspecified: Secondary | ICD-10-CM | POA: Insufficient documentation

## 2018-06-15 DIAGNOSIS — I110 Hypertensive heart disease with heart failure: Secondary | ICD-10-CM | POA: Insufficient documentation

## 2018-06-15 DIAGNOSIS — Z79899 Other long term (current) drug therapy: Secondary | ICD-10-CM | POA: Insufficient documentation

## 2018-06-15 DIAGNOSIS — Z794 Long term (current) use of insulin: Secondary | ICD-10-CM | POA: Insufficient documentation

## 2018-06-15 DIAGNOSIS — I5022 Chronic systolic (congestive) heart failure: Secondary | ICD-10-CM

## 2018-06-15 DIAGNOSIS — I471 Supraventricular tachycardia: Secondary | ICD-10-CM | POA: Insufficient documentation

## 2018-06-15 DIAGNOSIS — Z95810 Presence of automatic (implantable) cardiac defibrillator: Secondary | ICD-10-CM | POA: Diagnosis not present

## 2018-06-15 DIAGNOSIS — K219 Gastro-esophageal reflux disease without esophagitis: Secondary | ICD-10-CM | POA: Diagnosis not present

## 2018-06-15 DIAGNOSIS — G43909 Migraine, unspecified, not intractable, without status migrainosus: Secondary | ICD-10-CM | POA: Diagnosis not present

## 2018-06-15 DIAGNOSIS — E785 Hyperlipidemia, unspecified: Secondary | ICD-10-CM | POA: Insufficient documentation

## 2018-06-15 DIAGNOSIS — Z8249 Family history of ischemic heart disease and other diseases of the circulatory system: Secondary | ICD-10-CM | POA: Diagnosis not present

## 2018-06-15 DIAGNOSIS — F17210 Nicotine dependence, cigarettes, uncomplicated: Secondary | ICD-10-CM | POA: Insufficient documentation

## 2018-06-15 DIAGNOSIS — K861 Other chronic pancreatitis: Secondary | ICD-10-CM

## 2018-06-15 DIAGNOSIS — Z9981 Dependence on supplemental oxygen: Secondary | ICD-10-CM | POA: Insufficient documentation

## 2018-06-15 DIAGNOSIS — Z7982 Long term (current) use of aspirin: Secondary | ICD-10-CM | POA: Insufficient documentation

## 2018-06-15 DIAGNOSIS — G4733 Obstructive sleep apnea (adult) (pediatric): Secondary | ICD-10-CM

## 2018-06-15 DIAGNOSIS — M109 Gout, unspecified: Secondary | ICD-10-CM | POA: Insufficient documentation

## 2018-06-15 DIAGNOSIS — I420 Dilated cardiomyopathy: Secondary | ICD-10-CM | POA: Diagnosis not present

## 2018-06-15 DIAGNOSIS — I252 Old myocardial infarction: Secondary | ICD-10-CM | POA: Insufficient documentation

## 2018-06-15 DIAGNOSIS — I10 Essential (primary) hypertension: Secondary | ICD-10-CM

## 2018-06-15 DIAGNOSIS — K859 Acute pancreatitis without necrosis or infection, unspecified: Secondary | ICD-10-CM | POA: Diagnosis not present

## 2018-06-15 NOTE — Patient Instructions (Signed)
Continue weighing daily and call for an overnight weight gain of > 2 pounds or a weekly weight gain of >5 pounds. 

## 2018-06-16 ENCOUNTER — Encounter: Payer: Self-pay | Admitting: Family

## 2018-06-22 ENCOUNTER — Ambulatory Visit: Payer: Medicare Other | Admitting: Gastroenterology

## 2018-06-23 ENCOUNTER — Ambulatory Visit
Admission: RE | Admit: 2018-06-23 | Discharge: 2018-06-23 | Disposition: A | Discharge status: Home / Self Care | Payer: Medicare Other | Source: Ambulatory Visit | Attending: Family | Admitting: Family

## 2018-06-23 DIAGNOSIS — E785 Hyperlipidemia, unspecified: Secondary | ICD-10-CM | POA: Insufficient documentation

## 2018-06-23 DIAGNOSIS — I5022 Chronic systolic (congestive) heart failure: Secondary | ICD-10-CM | POA: Insufficient documentation

## 2018-06-23 DIAGNOSIS — E119 Type 2 diabetes mellitus without complications: Secondary | ICD-10-CM | POA: Insufficient documentation

## 2018-06-23 DIAGNOSIS — K219 Gastro-esophageal reflux disease without esophagitis: Secondary | ICD-10-CM | POA: Insufficient documentation

## 2018-06-23 DIAGNOSIS — I110 Hypertensive heart disease with heart failure: Secondary | ICD-10-CM | POA: Insufficient documentation

## 2018-06-23 DIAGNOSIS — Z95810 Presence of automatic (implantable) cardiac defibrillator: Secondary | ICD-10-CM | POA: Insufficient documentation

## 2018-06-23 DIAGNOSIS — G4730 Sleep apnea, unspecified: Secondary | ICD-10-CM | POA: Diagnosis not present

## 2018-06-23 DIAGNOSIS — I340 Nonrheumatic mitral (valve) insufficiency: Secondary | ICD-10-CM | POA: Diagnosis not present

## 2018-06-23 DIAGNOSIS — I252 Old myocardial infarction: Secondary | ICD-10-CM | POA: Insufficient documentation

## 2018-06-23 DIAGNOSIS — I2510 Atherosclerotic heart disease of native coronary artery without angina pectoris: Secondary | ICD-10-CM | POA: Diagnosis not present

## 2018-06-23 DIAGNOSIS — I420 Dilated cardiomyopathy: Secondary | ICD-10-CM | POA: Insufficient documentation

## 2018-06-23 NOTE — Progress Notes (Signed)
*  PRELIMINARY RESULTS* Echocardiogram 2D Echocardiogram has been performed.  Sallie, Maker 06/23/2018, 10:41 AM

## 2018-07-08 ENCOUNTER — Ambulatory Visit (INDEPENDENT_AMBULATORY_CARE_PROVIDER_SITE_OTHER): Payer: Medicare Other | Admitting: Podiatry

## 2018-07-08 ENCOUNTER — Encounter: Payer: Self-pay | Admitting: Podiatry

## 2018-07-08 DIAGNOSIS — E119 Type 2 diabetes mellitus without complications: Secondary | ICD-10-CM

## 2018-07-08 DIAGNOSIS — M79676 Pain in unspecified toe(s): Secondary | ICD-10-CM

## 2018-07-08 DIAGNOSIS — B351 Tinea unguium: Secondary | ICD-10-CM | POA: Diagnosis not present

## 2018-07-08 NOTE — Progress Notes (Signed)
Complaint:  Visit Type: Patient returns to my office for continued preventative foot care services. Complaint: Patient states" my nails have grown long and thick and become painful to walk and wear shoes" Patient has been diagnosed with DM with no foot complications. The patient presents for preventative foot care services. No changes to ROS  Podiatric Exam: Vascular: dorsalis pedis and posterior tibial pulses are palpable bilateral. Capillary return is immediate. Temperature gradient is WNL. Skin turgor WNL  Sensorium: Normal Semmes Weinstein monofilament test. Normal tactile sensation bilaterally. Nail Exam: Pt has thick disfigured discolored nails with subungual debris noted bilateral entire nail hallux through fifth toenails Ulcer Exam: There is no evidence of ulcer or pre-ulcerative changes or infection. Orthopedic Exam: Muscle tone and strength are WNL. No limitations in general ROM. No crepitus or effusions noted. Foot type and digits show no abnormalities. Bony prominences are unremarkable. Skin: No Porokeratosis. No infection or ulcers.  Heel calus  B/L.  Diagnosis:  Onychomycosis, , Pain in right toe, pain in left toes  Treatment & Plan Procedures and Treatment: Consent by patient was obtained for treatment procedures. The patient understood the discussion of treatment and procedures well. All questions were answered thoroughly reviewed. Debridement of mycotic and hypertrophic toenails, 1 through 5 bilateral and clearing of subungual debris. No ulceration, no infection noted. Medicaid  ABN signed for 2019.  Return Visit-Office Procedure: Patient instructed to return to the office for a follow up visit 3 months for continued evaluation and treatment.    [name] DPM

## 2018-07-27 ENCOUNTER — Ambulatory Visit: Payer: Medicare Other | Admitting: Gastroenterology

## 2018-09-14 ENCOUNTER — Ambulatory Visit: Payer: Medicare Other | Admitting: Gastroenterology

## 2018-10-11 ENCOUNTER — Ambulatory Visit: Payer: Medicare Other | Admitting: Podiatry

## 2018-10-21 ENCOUNTER — Ambulatory Visit: Payer: Medicare Other | Admitting: Podiatry

## 2018-10-28 ENCOUNTER — Ambulatory Visit (INDEPENDENT_AMBULATORY_CARE_PROVIDER_SITE_OTHER): Payer: Medicare Other | Admitting: Podiatry

## 2018-10-28 ENCOUNTER — Encounter: Payer: Self-pay | Admitting: Podiatry

## 2018-10-28 DIAGNOSIS — M79676 Pain in unspecified toe(s): Secondary | ICD-10-CM

## 2018-10-28 DIAGNOSIS — E119 Type 2 diabetes mellitus without complications: Secondary | ICD-10-CM | POA: Diagnosis not present

## 2018-10-28 DIAGNOSIS — B351 Tinea unguium: Secondary | ICD-10-CM | POA: Diagnosis not present

## 2018-10-28 NOTE — Progress Notes (Signed)
Complaint:  Visit Type: Patient returns to my office for continued preventative foot care services. Complaint: Patient states" my nails have grown long and thick and become painful to walk and wear shoes" Patient has been diagnosed with DM with no foot complications. The patient presents for preventative foot care services. No changes to ROS  Podiatric Exam: Vascular: dorsalis pedis and posterior tibial pulses are palpable bilateral. Capillary return is immediate. Temperature gradient is WNL. Skin turgor WNL  Sensorium: Normal Semmes Weinstein monofilament test. Normal tactile sensation bilaterally. Nail Exam: Pt has thick disfigured discolored nails with subungual debris noted bilateral entire nail hallux through fifth toenails Ulcer Exam: There is no evidence of ulcer or pre-ulcerative changes or infection. Orthopedic Exam: Muscle tone and strength are WNL. No limitations in general ROM. No crepitus or effusions noted. Foot type and digits show no abnormalities. 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 Skin: No Porokeratosis. No infection or ulcers.  Heel calus  B/L.  Diagnosis:  Onychomycosis, , Pain in right toe, pain in left toes  Treatment & Plan Procedures and Treatment: Consent by patient was obtained for treatment procedures. The patient understood the discussion of treatment and procedures well. All questions were answered thoroughly reviewed. Debridement of mycotic and hypertrophic toenails, 1 through 5 bilateral and clearing of subungual debris. No ulceration, no infection noted.  Return Visit-Office Procedure: Patient instructed to return to the office for a follow up visit 3 months  for continued evaluation and treatment.    Willma Obando DPM 

## 2018-11-04 ENCOUNTER — Other Ambulatory Visit: Payer: Self-pay | Admitting: Family

## 2018-11-04 MED ORDER — ATORVASTATIN CALCIUM 40 MG PO TABS: 40.0000 mg | ORAL_TABLET | Freq: Every day | ORAL | 3 refills | Status: DC

## 2018-11-04 MED ORDER — SPIRONOLACTONE 25 MG PO TABS: 25.0000 mg | ORAL_TABLET | Freq: Every day | ORAL | 3 refills | Status: DC

## 2018-11-04 MED ORDER — METOPROLOL SUCCINATE ER 25 MG PO TB24: 25.0000 mg | ORAL_TABLET | Freq: Every day | ORAL | 3 refills | Status: DC

## 2018-11-04 MED ORDER — TORSEMIDE 20 MG PO TABS: 20.0000 mg | ORAL_TABLET | Freq: Every day | ORAL | 3 refills | Status: DC

## 2018-11-15 NOTE — Progress Notes (Deleted)
Patient ID: Oscar [name], male    DOB: 01-Apr-1976, 43 y.o.   MRN: [id number]  HPI  Oscar [name] is a 43 y/o male with a history of obstructive sleep apnea (currently with CPAP), MI, HTN, hyperlipidemia, GERD, DM, SVT, asthma and chronic heart failure.  Echo report from 06/23/18 reviewed and showed an EF of 20-25% along with mild Oscar. Echo done 02/06/15 which showed an EF of 25% which is unchanged from previous echo on 12/10/14 which showed an EF of 25% and mild Oscar.   Was in the ED 05/18/18 due to epigastric pain where he was treated and released.   He presents today for a follow-up visit with a chief complaint of   Past Medical History:  Diagnosis Date  . AICD (automatic cardioverter/defibrillator) present   . Asthma   . Cardiomyopathy ([place])   . CHF (congestive heart failure) ([place])   . Coronary artery disease   . Deafness in right ear   . Diabetes mellitus without complication ([place])   . Dilated cardiomyopathy ([place])   . Dysrhythmia    svt  . 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 GERD (gastroesophageal reflux disease)   . Hyperlipidemia   . Hypertension   . Hypoxemia   . Hypoxemia   . Mild obesity   . Myocardial infarction ([place])    6010,9323,5/57  . Pancreatitis   . Sleep apnea    osa   Past Surgical History:  Procedure Laterality Date  . CARDIAC CATHETERIZATION  12/11/2014   Procedure: RIGHT/LEFT HEART CATH AND CORONARY ANGIOGRAPHY;  Surgeon: Lorretta Harp, MD;  Location: [facility] C[facility] LAB;  Service: Cardiovascular;;  . ICD LEAD REMOVAL N/A 03/30/2015   Procedure: ICD LEAD REMOVAL;  Surgeon: Marzetta Board, MD;  Location: [facility] ORS;  Service: Cardiovascular;  Laterality: N/A;  . IMPLANTABLE CARDIOVERTER DEFIBRILLATOR IMPLANT    . INSERT / REPLACE / REMOVE PACEMAKER    . LEFT HEART CATHETERIZATION WITH CORONARY ANGIOGRAM N/A 12/09/2014   Procedure: LEFT HEART CATHETERIZATION WITH CORONARY ANGIOGRAM;  Surgeon: Burnell Blanks, MD;  Location: [facility] CATH LAB;  Service:  Cardiovascular;  Laterality: N/A;   Family History  Problem Relation Age of Onset  . Hypertension Mother   . Congestive Heart Failure Mother   . Hypertension Sister   . Diabetes Sister   . Pancreatitis Sister   . COPD Sister   . Pancreatitis Brother   . Anemia Neg Hx   . Arrhythmia Neg Hx   . Asthma Neg Hx   . Clotting disorder Neg Hx   . Fainting Neg Hx   . Heart attack Neg Hx   . Heart disease Neg Hx   . Heart failure Neg Hx   . Hyperlipidemia Neg Hx    Social History   Tobacco Use  . Smoking status: Current Every Day Smoker    Packs/day: 0.50    Years: 24.00    Pack years: 12.00    Types: Cigarettes  . Smokeless tobacco: Never Used  . Tobacco comment: 4/5 Smoking 5 cigs a day  Substance Use Topics  . Alcohol use: No   Allergies  Allergen Reactions  . Bidil [Isosorb Dinitrate-Hydralazine] Other (See Comments)    Migraine Headache Migraine Headache  . Ciprofloxacin       Review of Systems  Constitutional: Positive for fatigue (better without entresto). Negative for appetite change.  HENT: Negative for congestion, postnasal drip and sore throat.   Eyes: Negative.   Respiratory: Positive  for cough. Negative for chest tightness, shortness of breath and wheezing.   Cardiovascular: Negative for chest pain, palpitations and leg swelling.  Gastrointestinal: Positive for abdominal pain (at times). Negative for abdominal distention, diarrhea and nausea.  Endocrine: Negative.   Genitourinary: Negative.   Musculoskeletal: Negative for arthralgias and back pain.  Skin: Negative.   Allergic/Immunologic: Negative.   Neurological: Negative for dizziness, light-headedness and headaches.  Hematological: Negative for adenopathy. Does not bruise/bleed easily.  Psychiatric/Behavioral: Negative for dysphoric mood, sleep disturbance (sleeping on 4 pillows) and suicidal ideas. The patient is not nervous/anxious.     Physical Exam  Constitutional: He is oriented to person,  place, and time. He appears well-developed and well-nourished.  HENT:  Head: Normocephalic and atraumatic.  Neck: Normal range of motion. Neck supple. No JVD present.  Cardiovascular: Normal rate and regular rhythm.  Pulmonary/Chest: Effort normal. He has no wheezes. He has no rales.  Abdominal: Soft. He exhibits no distension. There is no abdominal tenderness.  Musculoskeletal:        General: No tenderness or edema.  Neurological: He is alert and oriented to person, place, and time.  Skin: Skin is warm and dry.  Psychiatric: He has a normal mood and affect. His behavior is normal. Thought content normal.  Nursing note and vitals reviewed.    Assessment & Plan:  1: Chronic heart failure with reduced ejection fraction- - NYHA class II - euvolemic today - weighing daily; Reminded to call for an overnight weight gain of >2 pounds or a weekly weight gain of >5 pounds.  - weight  - tolerating toprol XL without known side effects; consider titrating up -  - last saw cardiology Oscar [name]) 10/08/17 - bidil gave him migraine headache - does not meet ReDS vest criteria due to BMI - BNP on 05/18/17 was 156.0 -  2: HTN- - BP - sees PCP (Oscar [name]) at [place] from 05/18/18 reviewed and shows sodium 138, potassium 4.0, creatinine 1.01 & GFR >60  3: Obstructive sleep apnea- - wearing his CPAP nightly - continues wearing oxygen at 2-3L    4: Diabetes- - fasting glucose at home was  - lantus is being titrated up by PCP - A1c from 09/12/16 was 10.9%  5: Tobacco use- - continues to smoke 1/2 ppd cigarettes daily - does remove himself from the oxygen when smoking - complete cessation discussed for 3 minutes with him  6: Pancreatitis- - says when it flares, that his stomach hurts him so much that he can't really eat and can only keep down jello, freezer pops etc - denies any alcohol use - saw GI provider Oscar [name]) 03/02/18  Medication list was reviewed.

## 2018-11-16 ENCOUNTER — Ambulatory Visit: Payer: Medicare Other | Admitting: Family

## 2018-11-23 ENCOUNTER — Ambulatory Visit: Payer: Medicare Other | Admitting: Family

## 2018-12-21 ENCOUNTER — Telehealth: Payer: Self-pay | Admitting: Family

## 2018-12-21 ENCOUNTER — Telehealth: Payer: Medicare Other | Admitting: Family

## 2018-12-21 NOTE — Telephone Encounter (Signed)
Patient did not return phone call regarding consent for his virtual HF Clinic visit on 12/21/2018. Will attempt to reschedule.

## 2019-03-17 ENCOUNTER — Other Ambulatory Visit: Payer: Self-pay | Admitting: Student

## 2019-03-17 DIAGNOSIS — Z8719 Personal history of other diseases of the digestive system: Secondary | ICD-10-CM

## 2019-03-17 DIAGNOSIS — R1010 Upper abdominal pain, unspecified: Secondary | ICD-10-CM

## 2019-04-06 ENCOUNTER — Ambulatory Visit: Admission: RE | Admit: 2019-04-06 | Payer: Medicare Other | Source: Ambulatory Visit

## 2019-04-14 ENCOUNTER — Ambulatory Visit: Payer: Medicare Other | Admitting: Podiatry

## 2019-04-21 ENCOUNTER — Ambulatory Visit: Payer: Medicare Other | Admitting: Podiatry

## 2019-04-25 ENCOUNTER — Encounter: Payer: Self-pay | Admitting: Podiatry

## 2019-04-25 ENCOUNTER — Ambulatory Visit (INDEPENDENT_AMBULATORY_CARE_PROVIDER_SITE_OTHER): Payer: Medicare Other | Admitting: Podiatry

## 2019-04-25 ENCOUNTER — Other Ambulatory Visit: Payer: Self-pay

## 2019-04-25 VITALS — Temp 97.9°F

## 2019-04-25 DIAGNOSIS — M79676 Pain in unspecified toe(s): Secondary | ICD-10-CM | POA: Diagnosis not present

## 2019-04-25 DIAGNOSIS — E119 Type 2 diabetes mellitus without complications: Secondary | ICD-10-CM

## 2019-04-25 DIAGNOSIS — B351 Tinea unguium: Secondary | ICD-10-CM

## 2019-04-25 NOTE — Progress Notes (Signed)
Complaint:  Visit Type: Patient returns to my office for continued preventative foot care services. Complaint: Patient states" my nails have grown long and thick and become painful to walk and wear shoes" Patient has been diagnosed with DM with no foot complications. The patient presents for preventative foot care services. No changes to ROS  Podiatric Exam: Vascular: dorsalis pedis and posterior tibial pulses are palpable bilateral. Capillary return is immediate. Temperature gradient is WNL. Skin turgor WNL  Sensorium: Normal Semmes Weinstein monofilament test. Normal tactile sensation bilaterally. Nail Exam: Pt has thick disfigured discolored nails with subungual debris noted bilateral entire nail hallux through fifth toenails Ulcer Exam: There is no evidence of ulcer or pre-ulcerative changes or infection. Orthopedic Exam: Muscle tone and strength are WNL. No limitations in general ROM. No crepitus or effusions noted. Foot type and digits show no abnormalities. 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 Skin: No Porokeratosis. No infection or ulcers.  Heel calus  B/L.  Diagnosis:  Onychomycosis, , Pain in right toe, pain in left toes  Treatment & Plan Procedures and Treatment: Consent by patient was obtained for treatment procedures. The patient understood the discussion of treatment and procedures well. All questions were answered thoroughly reviewed. Debridement of mycotic and hypertrophic toenails, 1 through 5 bilateral and clearing of subungual debris. No ulceration, no infection noted.  Return Visit-Office Procedure: Patient instructed to return to the office for a follow up visit 3 months  for continued evaluation and treatment.    Maicie Vanderloop DPM 

## 2019-05-11 NOTE — Progress Notes (Signed)
Patient ID: Oscar [name], male    DOB: 03-May-1976, 43 y.o.   MRN: [id number]  HPI  Oscar [name] is a 43 y/o male with a history of obstructive sleep apnea (currently with CPAP), MI, HTN, hyperlipidemia, GERD, DM, SVT, asthma and chronic heart failure.  Echo done 06/23/18 reviewed and showed an EF of 20-25% along with mild Oscar. Echo done 02/06/15 which showed an EF of 25% which is unchanged from previous echo on 12/10/14 which showed an EF of 25% and mild Oscar.   Has not been admitted or been in the ED in the last 6 months.    He presents today for a follow-up visit with a chief complaint of minimal shortness of breath upon moderate exertion. He describes this as chronic in nature having been present for several years. He has associated fatigue, cough, palpitations, intermittent abdominal pain and gradual weight gain. He denies any difficulty sleeping, dizziness, abdominal distention, pedal edema, chest pain or wheezing. Has been taking entresto dose of 24/64m twice daily and doesn't notice any issues with taking it.    Past Medical History:  Diagnosis Date  . AICD (automatic cardioverter/defibrillator) present   . Asthma   . Cardiomyopathy ([facility]   . [place]estive heart failure) ([facility] [place]   . Coronary artery disease   . Deafness in right ear   . Diabetes mellitus without complication ([facility]   . Dilated cardiomyopathy ([facility]   . Dysrhythmia    svt  . 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 GERD (gastroesophageal reflux disease)   . Hyperlipidemia   . Hypertension   . Hypoxemia   . Hypoxemia   . Mild obesity   . Myocardial infarction ([place]    23794,3276,1/47 . Pancreatitis   . Sleep apnea    osa   Past Surgical History:  Procedure Laterality Date  . CARDIAC CATHETERIZATION  12/11/2014   Procedure: RIGHT/LEFT HEART CATH AND CORONARY ANGIOGRAPHY;  Surgeon: JLorretta Harp MD;  [facility];  Service: Cardiovascular;;  . ICD LEAD REMOVAL N/A 03/30/2015   Procedure: ICD  LEAD REMOVAL;  Surgeon: KMarzetta Board MD;  Location: [facility] ORS;  Service: Cardiovascular;  Laterality: N/A;  . IMPLANTABLE CARDIOVERTER DEFIBRILLATOR IMPLANT    . INSERT / REPLACE / REMOVE PACEMAKER    . LEFT HEART CATHETERIZATION WITH CORONARY ANGIOGRAM N/A 12/09/2014   Procedure: LEFT HEART CATHETERIZATION WITH CORONARY ANGIOGRAM;  Surgeon: CBurnell Blanks MD;  [facility];  Service: Cardiovascular;  Laterality: N/A;   Family History  Problem Relation Age of Onset  . Hypertension Mother   . Congestive Heart Failure Mother   . Hypertension Sister   . Diabetes Sister   . Pancreatitis Sister   . COPD Sister   . Pancreatitis Brother   . Anemia Neg Hx   . Arrhythmia Neg Hx   . Asthma Neg Hx   . Clotting disorder Neg Hx   . Fainting Neg Hx   . Heart attack Neg Hx   . Heart disease Neg Hx   . Heart failure Neg Hx   . Hyperlipidemia Neg Hx    Social History   Tobacco Use  . Smoking status: Current Every Day Smoker    Packs/day: 0.50    Years: 24.00    Pack years: 12.00    Types: Cigarettes  . Smokeless tobacco: Never Used  . Tobacco comment: 4/5 Smoking 5 cigs a day  Substance Use Topics  . Alcohol use:  No   Allergies  Allergen Reactions  . Bidil [Isosorb Dinitrate-Hydralazine] Other (See Comments)    Migraine Headache Migraine Headache  . Ciprofloxacin    Prior to Admission medications   Medication Sig Start Date End Date Taking? Authorizing Provider  allopurinol (ZYLOPRIM) 100 MG tablet  11/09/18  Yes [provider]  aluminum-magnesium hydroxide-simethicone (MAALOX) 496-759-16 MG/5ML SUSP Take 30 mLs by mouth 4 (four) times daily -  before meals and at bedtime. 05/18/18  Yes Carrie Mew, MD  amLODipine (NORVASC) 5 MG tablet Take by mouth.   Yes [provider]  aspirin EC 325 MG tablet Take 1 tablet (325 mg total) by mouth daily. 07/10/15  Yes Aldean Jewett, MD  atorvastatin (LIPITOR) 40 MG tablet Take 1 tablet (40 mg total)  by mouth daily at 6 PM. 11/04/18  Yes Darylene Price A, FNP  chlorhexidine (HIBICLENS) 4 % external liquid Use as body was daily in the shower 04/18/19  Yes [provider]  ciprofloxacin (CIPRO) 500 MG tablet  04/11/19  Yes [provider]  doxycycline (VIBRA-TABS) 100 MG tablet  04/18/19  Yes [provider]  ENTRESTO 49-51 MG TAKE 1 TABLET BY MOUTH TWO TIMES DAILY Patient taking differently: 0.5 tablets 2 (two) times daily.  03/05/18  Yes Darylene Price A, FNP  famotidine (PEPCID) 20 MG tablet Take 1 tablet (20 mg total) by mouth 2 (two) times daily. 05/18/18  Yes Carrie Mew, MD  furosemide (LASIX) 20 MG tablet Take by mouth.   Yes [provider]  gabapentin (NEURONTIN) 300 MG capsule Take 300 mg by mouth 2 (two) times daily.   Yes [provider]  insulin glargine (LANTUS) 100 UNIT/ML injection Inject into the skin. 12/07/15  Yes [provider]  insulin starter kit- syringes MISC 1 kit by Other route once. 07/10/15  Yes Aldean Jewett, MD  LEVEMIR FLEXTOUCH 100 UNIT/ML Pen  11/10/18  Yes [provider]  metoprolol succinate (TOPROL-XL) 25 MG 24 hr tablet Take 1 tablet (25 mg total) by mouth daily. 11/04/18  Yes Josslynn Mentzer A, FNP  omeprazole (PRILOSEC) 40 MG capsule Take by mouth. 03/17/19 03/16/20 Yes [provider]  ondansetron (ZOFRAN ODT) 8 MG disintegrating tablet Take 1 tablet (8 mg total) by mouth every 8 (eight) hours as needed for nausea or vomiting. 05/18/18  Yes Carrie Mew, MD  oxyCODONE-acetaminophen (PERCOCET/ROXICET) 5-325 MG tablet Take by mouth. 02/04/18  Yes [provider]  spironolactone (ALDACTONE) 25 MG tablet Take by mouth. 09/19/16  Yes [provider]  Syringe, Disposable, 1 ML MISC 0.1 mLs by Does not apply route as directed. 08/21/15  Yes Darylene Price A, FNP  tiotropium (SPIRIVA) 18 MCG inhalation capsule Place 18 mcg into inhaler and inhale daily.   Yes [provider]  torsemide (DEMADEX) 20 MG tablet Take 1 tablet (20 mg total) by mouth daily. 11/04/18  Yes Alisa Graff, FNP  ULTICARE MINI PEN NEEDLES 31G X 6 MM MISC  11/10/18  Yes [provider]  umeclidinium-vilanterol Jearl Klinefelter ELLIPTA) 62.5-25 MCG/INH AEPB  01/31/19  Yes [provider]  VENTOLIN HFA 108 (90 Base) MCG/ACT inhaler  01/21/19  Yes [provider]     Review of Systems  Constitutional: Positive for fatigue. Negative for appetite change.  HENT: Negative for congestion, postnasal drip and sore throat.   Eyes: Negative.   Respiratory: Positive for cough and shortness of breath. Negative for chest tightness and wheezing.   Cardiovascular: Positive for palpitations. Negative  for chest pain and leg swelling.  Gastrointestinal: Positive for abdominal pain (at times). Negative for abdominal distention, diarrhea and nausea.  Endocrine: Negative.   Genitourinary: Negative.   Musculoskeletal: Negative for arthralgias and back pain.  Skin: Negative.   Allergic/Immunologic: Negative.   Neurological: Negative for dizziness, light-headedness and headaches.  Hematological: Negative for adenopathy. Does not bruise/bleed easily.  Psychiatric/Behavioral: Negative for dysphoric mood, sleep disturbance (sleeping on 4 pillows) and suicidal ideas. The patient is not nervous/anxious.    Vitals:   05/13/19 1023  BP: (!) 121/92  Pulse: 85  Resp: 18  SpO2: 97%  Weight: (!) 306 lb 8 oz (139 kg)  Height: 5' 6"  (1.676 m)   Wt Readings from Last 3 Encounters:  05/13/19 (!) 306 lb 8 oz (139 kg)  06/15/18 287 lb 2 oz (130.2 kg)  05/18/18 288 lb (130.6 kg)   Lab Results  Component Value Date   CREATININE 1.01 05/18/2018   CREATININE 1.13 02/04/2018   CREATININE 1.59 (H) 08/20/2017    Physical Exam  Constitutional: He is oriented to person, place, and time. He appears well-developed and well-nourished.  HENT:  Head: Normocephalic and atraumatic.  Neck: Normal range of  motion. Neck supple. No JVD present.  Cardiovascular: Normal rate and regular rhythm.  Pulmonary/Chest: Effort normal. He has no wheezes. He has no rales.  Abdominal: Soft. He exhibits no distension. There is no abdominal tenderness.  Musculoskeletal:        General: No tenderness or edema.  Neurological: He is alert and oriented to person, place, and time.  Skin: Skin is warm and dry.  Psychiatric: He has a normal mood and affect. His behavior is normal. Thought content normal.  Nursing note and vitals reviewed.    Assessment & Plan:  1: Chronic heart failure with reduced ejection fraction- - NYHA class II - euvolemic today - weighing daily; Reminded to call for an overnight weight gain of >2 pounds or a weekly weight gain of >5 pounds.  - weight up 19.6 pounds from last visit here 11 months ago - tolerating toprol XL without known side effects; consider titrating up - tolerating entresto 24/68m bid without difficulty; discussed titrating up at his next visit - last saw cardiology (Oscar [name] 10/08/17 - bidil gave him migraine headache - BNP on 05/18/17 was 156.0 - insulin dependent so may not be able to use farxiga  2: HTN- - BP looks good today - [name] month [facility] - BMP from 05/18/18 reviewed and shows sodium 138, potassium 4.0, creatinine 1.01 & GFR >60  3: Obstructive sleep apnea- - not wearing CPAP anymore due to comfort level  - continues wearing oxygen at 3L at bedtime    4: Diabetes- - fasting glucose at home was 248 - A1c from 09/12/16 was 10.9%  5: Tobacco use- - continues to smoke 1/2 ppd cigarettes daily - does remove himself from the oxygen when smoking - complete cessation discussed for 3 minutes with him   Patient did not bring his medications nor a list. Each medication was verbally reviewed with the patient and he was encouraged to bring the bottles to every visit to confirm accuracy of  list. Emphasized bringing his bottles to every visit every time.  Return in 3 months or sooner for any questions/problems before then.

## 2019-05-13 ENCOUNTER — Ambulatory Visit: Payer: Medicare Other | Attending: Family | Admitting: Family

## 2019-05-13 ENCOUNTER — Other Ambulatory Visit: Payer: Self-pay

## 2019-05-13 ENCOUNTER — Encounter: Payer: Self-pay | Admitting: Family

## 2019-05-13 VITALS — BP 121/92 | HR 85 | Resp 18 | Ht 66.0 in | Wt 306.5 lb

## 2019-05-13 DIAGNOSIS — I252 Old myocardial infarction: Secondary | ICD-10-CM | POA: Insufficient documentation

## 2019-05-13 DIAGNOSIS — E785 Hyperlipidemia, unspecified: Secondary | ICD-10-CM | POA: Insufficient documentation

## 2019-05-13 DIAGNOSIS — Z79899 Other long term (current) drug therapy: Secondary | ICD-10-CM | POA: Diagnosis not present

## 2019-05-13 DIAGNOSIS — I471 Supraventricular tachycardia: Secondary | ICD-10-CM | POA: Diagnosis not present

## 2019-05-13 DIAGNOSIS — E119 Type 2 diabetes mellitus without complications: Secondary | ICD-10-CM

## 2019-05-13 DIAGNOSIS — J45909 Unspecified asthma, uncomplicated: Secondary | ICD-10-CM | POA: Diagnosis not present

## 2019-05-13 DIAGNOSIS — Z720 Tobacco use: Secondary | ICD-10-CM

## 2019-05-13 DIAGNOSIS — I420 Dilated cardiomyopathy: Secondary | ICD-10-CM | POA: Insufficient documentation

## 2019-05-13 DIAGNOSIS — F17210 Nicotine dependence, cigarettes, uncomplicated: Secondary | ICD-10-CM | POA: Diagnosis not present

## 2019-05-13 DIAGNOSIS — I5022 Chronic systolic (congestive) heart failure: Secondary | ICD-10-CM

## 2019-05-13 DIAGNOSIS — R0602 Shortness of breath: Secondary | ICD-10-CM | POA: Diagnosis present

## 2019-05-13 DIAGNOSIS — R002 Palpitations: Secondary | ICD-10-CM | POA: Diagnosis not present

## 2019-05-13 DIAGNOSIS — Z8249 Family history of ischemic heart disease and other diseases of the circulatory system: Secondary | ICD-10-CM | POA: Insufficient documentation

## 2019-05-13 DIAGNOSIS — I110 Hypertensive heart disease with heart failure: Secondary | ICD-10-CM | POA: Insufficient documentation

## 2019-05-13 DIAGNOSIS — G4733 Obstructive sleep apnea (adult) (pediatric): Secondary | ICD-10-CM | POA: Diagnosis not present

## 2019-05-13 DIAGNOSIS — K219 Gastro-esophageal reflux disease without esophagitis: Secondary | ICD-10-CM | POA: Diagnosis not present

## 2019-05-13 DIAGNOSIS — Z794 Long term (current) use of insulin: Secondary | ICD-10-CM | POA: Insufficient documentation

## 2019-05-13 DIAGNOSIS — I10 Essential (primary) hypertension: Secondary | ICD-10-CM

## 2019-05-13 DIAGNOSIS — Z95810 Presence of automatic (implantable) cardiac defibrillator: Secondary | ICD-10-CM | POA: Diagnosis not present

## 2019-05-13 DIAGNOSIS — R109 Unspecified abdominal pain: Secondary | ICD-10-CM | POA: Diagnosis not present

## 2019-05-13 DIAGNOSIS — I2510 Atherosclerotic heart disease of native coronary artery without angina pectoris: Secondary | ICD-10-CM | POA: Diagnosis not present

## 2019-05-13 DIAGNOSIS — Z7982 Long term (current) use of aspirin: Secondary | ICD-10-CM | POA: Diagnosis not present

## 2019-05-13 NOTE — Patient Instructions (Signed)
Continue weighing daily and call for an overnight weight gain of > 2 pounds or a weekly weight gain of >5 pounds. 

## 2019-05-26 ENCOUNTER — Encounter: Payer: Self-pay | Admitting: Podiatry

## 2019-05-26 ENCOUNTER — Other Ambulatory Visit: Payer: Self-pay

## 2019-05-26 ENCOUNTER — Ambulatory Visit (INDEPENDENT_AMBULATORY_CARE_PROVIDER_SITE_OTHER): Payer: Medicare Other

## 2019-05-26 ENCOUNTER — Ambulatory Visit (INDEPENDENT_AMBULATORY_CARE_PROVIDER_SITE_OTHER): Payer: Medicare Other | Admitting: Podiatry

## 2019-05-26 ENCOUNTER — Other Ambulatory Visit: Payer: Self-pay | Admitting: Podiatry

## 2019-05-26 DIAGNOSIS — L923 Foreign body granuloma of the skin and subcutaneous tissue: Secondary | ICD-10-CM | POA: Insufficient documentation

## 2019-05-26 DIAGNOSIS — S90852A Superficial foreign body, left foot, initial encounter: Secondary | ICD-10-CM

## 2019-05-26 DIAGNOSIS — E119 Type 2 diabetes mellitus without complications: Secondary | ICD-10-CM | POA: Diagnosis not present

## 2019-05-26 DIAGNOSIS — S99922A Unspecified injury of left foot, initial encounter: Secondary | ICD-10-CM

## 2019-05-26 DIAGNOSIS — Z794 Long term (current) use of insulin: Secondary | ICD-10-CM

## 2019-05-26 MED ORDER — DOXYCYCLINE HYCLATE 100 MG PO TABS: 100.0000 mg | ORAL_TABLET | Freq: Two times a day (BID) | ORAL | 0 refills | Status: DC

## 2019-05-26 NOTE — Progress Notes (Signed)
This patient presents to the office he stepped on a nail 2 weeks ago.  He says that the nail penetrated the skin directly and not through any shoe.Marland Kitchen  He presents the office today stating that there is pain and thickness at the site of the entry of the nail.  He denies any drainage from the site of penetration.  Patient states he did not go to the emergency room and has not received any antibiotics nor has he received a tetanus shot. No history of chills and fever. This patient states he is diabetic and he presents the office to have his left foot evaluated and treated.  General Appearance  Alert, conversant and in no acute stress.  Vascular  Dorsalis pedis and posterior tibial  pulses are palpable  bilaterally.  Capillary return is within normal limits  bilaterally. Temperature is within normal limits  bilaterally.  Neurologic  Senn-Weinstein monofilament wire test within normal limits  bilaterally. Muscle power within normal limits bilaterally.  Nails Thick disfigured discolored nails with subungual debris  from hallux to fifth toes bilaterally. No evidence of bacterial infection or drainage bilaterally.  Orthopedic  No limitations of motion  feet .  No crepitus or effusions noted.  No bony pathology or digital deformities noted. HAV 1st MPJ right foot .    Skin  normotropic skin with no porokeratosis noted bilaterally.  No signs of infections or ulcers noted.  There is a thickened keratotic area noted in the center of the left arch.  No fluctuance noted.  No evidence of any redness swelling or inflammation noted.    Callus at site of FB pentration left foot.  Debride callus right foot.  Prescribe doxycycline prophylacticly.  Xray reveal no evidence pf foreign body left foot.  Call the office if pain occurs.  RTC preventative foot care appointment.   [name] DPM

## 2019-06-06 ENCOUNTER — Other Ambulatory Visit: Payer: Self-pay

## 2019-06-06 DIAGNOSIS — Z20822 Contact with and (suspected) exposure to covid-19: Secondary | ICD-10-CM

## 2019-06-08 LAB — NOVEL CORONAVIRUS, NAA: SARS-CoV-2, NAA: NOT DETECTED

## 2019-06-15 ENCOUNTER — Telehealth: Payer: Self-pay | Admitting: *Deleted

## 2019-06-15 NOTE — Telephone Encounter (Signed)
[place] request pt's last office notes and x-ray, may fax to [phone].

## 2019-07-25 ENCOUNTER — Ambulatory Visit: Payer: Medicare Other | Admitting: Podiatry

## 2019-08-04 ENCOUNTER — Other Ambulatory Visit: Payer: Self-pay

## 2019-08-04 ENCOUNTER — Ambulatory Visit (INDEPENDENT_AMBULATORY_CARE_PROVIDER_SITE_OTHER): Payer: Medicare Other | Admitting: Podiatry

## 2019-08-04 ENCOUNTER — Encounter: Payer: Self-pay | Admitting: Podiatry

## 2019-08-04 DIAGNOSIS — B351 Tinea unguium: Secondary | ICD-10-CM | POA: Diagnosis not present

## 2019-08-04 DIAGNOSIS — M79676 Pain in unspecified toe(s): Secondary | ICD-10-CM

## 2019-08-04 DIAGNOSIS — Z794 Long term (current) use of insulin: Secondary | ICD-10-CM

## 2019-08-04 DIAGNOSIS — E119 Type 2 diabetes mellitus without complications: Secondary | ICD-10-CM

## 2019-08-04 NOTE — Progress Notes (Signed)
Complaint:  Visit Type: Patient returns to my office for continued preventative foot care services. Complaint: Patient states" my nails have grown long and thick and become painful to walk and wear shoes" Patient has been diagnosed with DM with no foot complications. The patient presents for preventative foot care services. No changes to ROS  Podiatric Exam: Vascular: dorsalis pedis and posterior tibial pulses are palpable bilateral. Capillary return is immediate. Temperature gradient is WNL. Skin turgor WNL  Sensorium: Normal Semmes Weinstein monofilament test. Normal tactile sensation bilaterally. Nail Exam: Pt has thick disfigured discolored nails with subungual debris noted bilateral entire nail hallux through fifth toenails Ulcer Exam: There is no evidence of ulcer or pre-ulcerative changes or infection. Orthopedic Exam: Muscle tone and strength are WNL. No limitations in general ROM. No crepitus or effusions noted. Foot type and digits show no abnormalities. 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 Skin: No Porokeratosis. No infection or ulcers.  Heel calus  B/L.  Diagnosis:  Onychomycosis, , Pain in right toe, pain in left toes  Treatment & Plan Procedures and Treatment: Consent by patient was obtained for treatment procedures. The patient understood the discussion of treatment and procedures well. All questions were answered thoroughly reviewed. Debridement of mycotic and hypertrophic toenails, 1 through 5 bilateral and clearing of subungual debris. No ulceration, no infection noted.  Return Visit-Office Procedure: Patient instructed to return to the office for a follow up visit 3 months  for continued evaluation and treatment.    Franshesca Chipman DPM 

## 2019-08-10 ENCOUNTER — Ambulatory Visit: Payer: Medicare Other | Admitting: Family

## 2019-08-11 ENCOUNTER — Ambulatory Visit: Payer: Medicare Other | Admitting: Family

## 2019-09-12 NOTE — Progress Notes (Addendum)
Patient ID: Oscar [name], male    DOB: 12/11/1975, 44 y.o.   MRN: [id number]  HPI  Oscar [name] is a 44 y/o male with a history of obstructive sleep apnea (currently with CPAP), MI, HTN, hyperlipidemia, GERD, DM, SVT, asthma and chronic heart failure.  Echo done 03/01/2019 which showed an EF of 15% along with mild Oscar          Echo done 06/23/18 reviewed and showed an EF of 20-25% along with mild Oscar. Echo done 02/06/15 which showed an EF of 25% which is unchanged from previous echo on 12/10/14 which showed an EF of 25% and mild Oscar.   Has not been admitted or been in the ED in the last 6 months.    He presents today for a follow-up visit with a chief complaint of minimal shortness of breath upon moderate exertion. He describes this as chronic in nature having been present for several years. He has associated fatigue, cough, palpitations, abdominal distention (at times), occasional light-headedness and gradual weight gain along with this. He denies any difficulty sleeping, pedal edema, chest pain or wheezing.   Admits to adding salt to his food and eating out and just cooking more at home. He hasn't been taking his metoprolol succinate because he says cardiology wanted him to take 3 every day and he wasn't comfortable with that so he just stopped taking it.   Past Medical History:  Diagnosis Date  . AICD (automatic cardioverter/defibrillator) present   . Asthma   . Cardiomyopathy ([place])   . CHF (congestive heart failure) ([place])   . Coronary artery disease   . Deafness in right ear   . Diabetes mellitus without complication ([place])   . Dilated cardiomyopathy ([place])   . Dysrhythmia    svt  . 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 GERD (gastroesophageal reflux disease)   . Hyperlipidemia   . Hypertension   . Hypoxemia   . Hypoxemia   . Mild obesity   . Myocardial infarction ([place])    [id number],7/93  . Pancreatitis   . Sleep apnea    osa   Past Surgical History:  Procedure  Laterality Date  . CARDIAC CATHETERIZATION  12/11/2014   Procedure: RIGHT/LEFT HEART CATH AND CORONARY ANGIOGRAPHY;  Surgeon: Lorretta Harp, MD;  Location[facility] CATH LAB;  Service: Cardiovascular;;  . ICD LEAD REMOVAL N/A 03/30/2015   Procedure: ICD LEAD REMOVAL;  Surgeon: Marzetta Board, MD;  Location: [facility] ORS;  Service: Cardiovascular;  Laterality: N/A;  . IMPLANTABLE CARDIOVERTER DEFIBRILLATOR IMPLANT    . INSERT / REPLACE / REMOVE PACEMAKER    . LEFT HEART CATHETERIZATION WITH CORONARY ANGIOGRAM N/A 12/09/2014   Procedure: LEFT HEART CATHETERIZATION WITH CORONARY ANGIOGRAM;  Surgeon: Burnell Blanks, MD;  [facility];  Service: Cardiovascular;  Laterality: N/A;   Family History  Problem Relation Age of Onset  . Hypertension Mother   . Congestive Heart Failure Mother   . Hypertension Sister   . Diabetes Sister   . Pancreatitis Sister   . COPD Sister   . Pancreatitis Brother   . Anemia Neg Hx   . Arrhythmia Neg Hx   . Asthma Neg Hx   . Clotting disorder Neg Hx   . Fainting Neg Hx   . Heart attack Neg Hx   . Heart disease Neg Hx   . Heart failure Neg Hx   . Hyperlipidemia Neg Hx    Social History  Tobacco Use  . Smoking status: Current Every Day Smoker    Packs/day: 0.50    Years: 24.00    Pack years: 12.00    Types: Cigarettes  . Smokeless tobacco: Never Used  . Tobacco comment: 4/5 Smoking 5 cigs a day  Substance Use Topics  . Alcohol use: No   Allergies  Allergen Reactions  . Bidil [Isosorb Dinitrate-Hydralazine] Other (See Comments)    Migraine Headache Migraine Headache  . Ciprofloxacin    Prior to Admission medications   Medication Sig Start Date End Date Taking? Authorizing Provider  Accu-Chek Softclix Lancets lancets USE TO CHECK BLOOD SUGAR THREE TIMES DAILY 05/20/19  Yes [provider]  allopurinol (ZYLOPRIM) 100 MG tablet  11/09/18  Yes [provider]  aluminum-magnesium hydroxide-simethicone (MAALOX) 944-967-59 MG/5ML  SUSP Take 30 mLs by mouth 4 (four) times daily -  before meals and at bedtime. 05/18/18  Yes Carrie Mew, MD  amLODipine (NORVASC) 5 MG tablet Take by mouth.   Yes [provider]  aspirin EC 325 MG tablet Take 1 tablet (325 mg total) by mouth daily. 07/10/15  Yes Aldean Jewett, MD  atorvastatin (LIPITOR) 40 MG tablet Take 1 tablet (40 mg total) by mouth daily at 6 PM. 11/04/18  Yes Darylene Price A, FNP  chlorhexidine (HIBICLENS) 4 % external liquid Use as body was daily in the shower 04/18/19  Yes [provider]  doxycycline (VIBRA-TABS) 100 MG tablet Take 1 tablet (100 mg total) by mouth 2 (two) times daily. 05/26/19  Yes [name], DPM  famotidine (PEPCID) 20 MG tablet Take 1 tablet (20 mg total) by mouth 2 (two) times daily. 05/18/18  Yes Carrie Mew, MD  furosemide (LASIX) 20 MG tablet Take by mouth.   Yes [provider]  gabapentin (NEURONTIN) 300 MG capsule Take 300 mg by mouth 2 (two) times daily.   Yes [provider]  insulin glargine (LANTUS) 100 UNIT/ML injection Inject into the skin. 12/07/15  Yes [provider]  insulin starter kit- syringes MISC 1 kit by Other route once. 07/10/15  Yes Aldean Jewett, MD  LEVEMIR FLEXTOUCH 100 UNIT/ML Pen  11/10/18  Yes [provider]  omeprazole (PRILOSEC) 40 MG capsule Take by mouth. 03/17/19 03/16/20 Yes [provider]  ondansetron (ZOFRAN ODT) 8 MG disintegrating tablet Take 1 tablet (8 mg total) by mouth every 8 (eight) hours as needed for nausea or vomiting. 05/18/18  Yes Carrie Mew, MD  oxyCODONE-acetaminophen (PERCOCET/ROXICET) 5-325 MG tablet Take by mouth. 02/04/18  Yes [provider]  sacubitril-valsartan (ENTRESTO) 49-51 MG Take 1 tablet by mouth 2 (two) times daily.   Yes [provider]  spironolactone (ALDACTONE) 25 MG tablet Take by mouth. 09/19/16  Yes [provider]  Syringe, Disposable, 1 ML MISC 0.1 mLs by Does not apply  route as directed. 08/21/15  Yes Darylene Price A, FNP  tiotropium (SPIRIVA) 18 MCG inhalation capsule Place 18 mcg into inhaler and inhale daily.   Yes [provider]  torsemide (DEMADEX) 20 MG tablet Take 1 tablet (20 mg total) by mouth daily. 11/04/18  Yes Alisa Graff, FNP  ULTICARE MINI PEN NEEDLES 31G X 6 MM MISC  11/10/18  Yes [provider]  umeclidinium-vilanterol (ANORO ELLIPTA) 62.5-25 MCG/INH AEPB  01/31/19  Yes [provider]  VENTOLIN HFA 108 (90 Base) MCG/ACT inhaler  01/21/19  Yes [provider]  metoprolol succinate (TOPROL-XL) 25 MG 24 hr tablet Take 1 tablet (25 mg total)  by mouth daily. Patient not taking: Reported on 09/13/2019 11/04/18   Alisa Graff, FNP     Review of Systems  Constitutional: Positive for fatigue. Negative for appetite change.  HENT: Negative for congestion, postnasal drip and sore throat.   Eyes: Negative.   Respiratory: Positive for cough and shortness of breath. Negative for chest tightness and wheezing.   Cardiovascular: Positive for palpitations. Negative for chest pain and leg swelling.  Gastrointestinal: Positive for abdominal distention (at times) and abdominal pain (at times). Negative for diarrhea and nausea.  Endocrine: Negative.   Genitourinary: Negative.   Musculoskeletal: Negative for arthralgias and back pain.  Skin: Negative.   Allergic/Immunologic: Negative.   Neurological: Positive for light-headedness (at times). Negative for dizziness and headaches.  Hematological: Negative for adenopathy. Does not bruise/bleed easily.  Psychiatric/Behavioral: Negative for dysphoric mood, sleep disturbance (sleeping on 4 pillows) and suicidal ideas. The patient is not nervous/anxious.    Vitals:   09/13/19 0919 09/13/19 0920  BP: (!) 166/105 (!) 150/100  Pulse: 83   Resp: 20   SpO2: 98%   Weight: (!) 319 lb (144.7 kg)   Height: 5' 6"  (1.676 m)    Wt Readings from Last 3 Encounters:  09/13/19 (!) 319 lb  (144.7 kg)  05/13/19 (!) 306 lb 8 oz (139 kg)  06/15/18 287 lb 2 oz (130.2 kg)   Lab Results  Component Value Date   CREATININE 1.01 05/18/2018   CREATININE 1.13 02/04/2018   CREATININE 1.59 (H) 08/20/2017    Physical Exam  Constitutional: He is oriented to person, place, and time. He appears well-developed and well-nourished.  HENT:  Head: Normocephalic and atraumatic.  Neck: No JVD present.  Cardiovascular: Normal rate and regular rhythm.  Pulmonary/Chest: Effort normal. He has no wheezes. He has no rales.  Abdominal: Soft. He exhibits no distension. There is no abdominal tenderness.  Musculoskeletal:        General: No tenderness or edema.     Cervical back: Normal range of motion and neck supple.  Neurological: He is alert and oriented to person, place, and time.  Skin: Skin is warm and dry.  Psychiatric: He has a normal mood and affect. His behavior is normal. Thought content normal.  Nursing note and vitals reviewed.    Assessment & Plan:  1: Chronic heart failure with reduced ejection fraction- - NYHA class II - euvolemic today - weighing daily; Reminded to call for an overnight weight gain of >2 pounds or a weekly weight gain of >5 pounds.  - weight up 13 pounds from last visit here 4 months ago - patient says that he's been eating out more, just eating more and has been adding salt to his food - last saw cardiology Oscar [name]) 07/21/2019 who wanted him to increase his metoprolol to 68m daily. Patient was concerned about whether he could take it with his other medications so he just stopped it - explained the importance of metoprolol succinate with his HF and he's agreeable to resuming at 256mdaily with the plan to up titrate as we are able - bidil gave him migraine headache - BNP on 05/18/17 was 156.0 - insulin dependent so may not be able to use farxiga - he does not take the flu vaccine; good handwashing encouraged  2: HTN- - BP elevated but he hasn't been  taking his metoprolol succinate; he also says that some days he takes torsemide and other days he take furosemide but not both on the same day -  sees PCP Oscar [name]) at [place] from 05/18/18 reviewed and shows sodium 138, potassium 4.0, creatinine 1.01 & GFR >60  3: Obstructive sleep apnea- - not wearing CPAP anymore due to comfort level  - continues wearing oxygen at 3L at bedtime    4: Diabetes- - fasting glucose at home was 241 but he says that he's still taking prednisone - A1c from 09/12/16 was 10.9%  5: Tobacco use- - continues to smoke 1/2 ppd cigarettes daily - does remove himself from the oxygen when smoking - complete cessation discussed for 3 minutes with him   Patient did not bring his medications nor a list. Emphasized that he needed to bring his medication bottles to every visit with him.   Return in 1 month or sooner for any questions/problems before then.

## 2019-09-13 ENCOUNTER — Ambulatory Visit: Payer: Medicare Other | Attending: Family | Admitting: Family

## 2019-09-13 ENCOUNTER — Encounter: Payer: Self-pay | Admitting: Family

## 2019-09-13 ENCOUNTER — Other Ambulatory Visit: Payer: Self-pay

## 2019-09-13 VITALS — BP 150/100 | HR 83 | Resp 20 | Ht 66.0 in | Wt 319.0 lb

## 2019-09-13 DIAGNOSIS — I252 Old myocardial infarction: Secondary | ICD-10-CM | POA: Insufficient documentation

## 2019-09-13 DIAGNOSIS — J45909 Unspecified asthma, uncomplicated: Secondary | ICD-10-CM | POA: Insufficient documentation

## 2019-09-13 DIAGNOSIS — G4733 Obstructive sleep apnea (adult) (pediatric): Secondary | ICD-10-CM | POA: Diagnosis not present

## 2019-09-13 DIAGNOSIS — I2510 Atherosclerotic heart disease of native coronary artery without angina pectoris: Secondary | ICD-10-CM | POA: Diagnosis not present

## 2019-09-13 DIAGNOSIS — Z95810 Presence of automatic (implantable) cardiac defibrillator: Secondary | ICD-10-CM | POA: Diagnosis not present

## 2019-09-13 DIAGNOSIS — Z8379 Family history of other diseases of the digestive system: Secondary | ICD-10-CM | POA: Diagnosis not present

## 2019-09-13 DIAGNOSIS — I471 Supraventricular tachycardia: Secondary | ICD-10-CM | POA: Diagnosis not present

## 2019-09-13 DIAGNOSIS — Z79899 Other long term (current) drug therapy: Secondary | ICD-10-CM | POA: Insufficient documentation

## 2019-09-13 DIAGNOSIS — Z833 Family history of diabetes mellitus: Secondary | ICD-10-CM | POA: Insufficient documentation

## 2019-09-13 DIAGNOSIS — F17210 Nicotine dependence, cigarettes, uncomplicated: Secondary | ICD-10-CM | POA: Insufficient documentation

## 2019-09-13 DIAGNOSIS — E669 Obesity, unspecified: Secondary | ICD-10-CM | POA: Diagnosis not present

## 2019-09-13 DIAGNOSIS — I420 Dilated cardiomyopathy: Secondary | ICD-10-CM | POA: Diagnosis not present

## 2019-09-13 DIAGNOSIS — I5022 Chronic systolic (congestive) heart failure: Secondary | ICD-10-CM | POA: Diagnosis not present

## 2019-09-13 DIAGNOSIS — I110 Hypertensive heart disease with heart failure: Secondary | ICD-10-CM | POA: Diagnosis not present

## 2019-09-13 DIAGNOSIS — Z8249 Family history of ischemic heart disease and other diseases of the circulatory system: Secondary | ICD-10-CM | POA: Diagnosis not present

## 2019-09-13 DIAGNOSIS — E119 Type 2 diabetes mellitus without complications: Secondary | ICD-10-CM | POA: Insufficient documentation

## 2019-09-13 DIAGNOSIS — Z881 Allergy status to other antibiotic agents status: Secondary | ICD-10-CM | POA: Insufficient documentation

## 2019-09-13 DIAGNOSIS — K219 Gastro-esophageal reflux disease without esophagitis: Secondary | ICD-10-CM | POA: Diagnosis not present

## 2019-09-13 DIAGNOSIS — E785 Hyperlipidemia, unspecified: Secondary | ICD-10-CM | POA: Insufficient documentation

## 2019-09-13 DIAGNOSIS — Z720 Tobacco use: Secondary | ICD-10-CM

## 2019-09-13 DIAGNOSIS — Z794 Long term (current) use of insulin: Secondary | ICD-10-CM | POA: Diagnosis not present

## 2019-09-13 DIAGNOSIS — Z825 Family history of asthma and other chronic lower respiratory diseases: Secondary | ICD-10-CM | POA: Insufficient documentation

## 2019-09-13 DIAGNOSIS — I10 Essential (primary) hypertension: Secondary | ICD-10-CM

## 2019-09-13 DIAGNOSIS — Z7982 Long term (current) use of aspirin: Secondary | ICD-10-CM | POA: Insufficient documentation

## 2019-09-13 NOTE — Patient Instructions (Addendum)
Continue weighing daily and call for an overnight weight gain of > 2 pounds or a weekly weight gain of >5 pounds.  Resume metoprolol back at 25mg  once daily.

## 2019-09-23 ENCOUNTER — Other Ambulatory Visit: Payer: Self-pay | Admitting: Family Medicine

## 2019-09-23 DIAGNOSIS — J449 Chronic obstructive pulmonary disease, unspecified: Secondary | ICD-10-CM

## 2019-09-23 DIAGNOSIS — J441 Chronic obstructive pulmonary disease with (acute) exacerbation: Secondary | ICD-10-CM

## 2019-09-23 DIAGNOSIS — J45901 Unspecified asthma with (acute) exacerbation: Secondary | ICD-10-CM

## 2019-10-10 NOTE — Progress Notes (Deleted)
Patient ID: Oscar [name], male    DOB: Jun 29, 1976, 44 y.o.   MRN: [id number]  HPI  Oscar [name] is a 44 y/o male with a history of obstructive sleep apnea (currently with CPAP), MI, HTN, hyperlipidemia, GERD, DM, SVT, asthma and chronic heart failure.  Echo done 03/01/2019 which showed an EF of 15% along with mild Oscar          Echo done 06/23/18 reviewed and showed an EF of 20-25% along with mild Oscar. Echo done 02/06/15 which showed an EF of 25% which is unchanged from previous echo on 12/10/14 which showed an EF of 25% and mild Oscar.   Has not been admitted or been in the ED in the last 6 months.    He presents today for a follow-up visit with a chief complaint of   Past Medical History:  Diagnosis Date  . AICD (automatic cardioverter/defibrillator) present   . Asthma   . Cardiomyopathy ([place])   . CHF (congestive heart failure) ([place])   . Coronary artery disease   . Deafness in right ear   . Diabetes mellitus without complication ([place])   . Dilated cardiomyopathy ([place])   . Dysrhythmia    svt  . 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 GERD (gastroesophageal reflux disease)   . Hyperlipidemia   . Hypertension   . Hypoxemia   . Hypoxemia   . Mild obesity   . Myocardial infarction ([place])    [id number]  . Pancreatitis   . Sleep apnea    osa   Past Surgical History:  Procedure Laterality Date  . CARDIAC CATHETERIZATION  12/11/2014   Procedure: RIGHT/LEFT HEART CATH AND CORONARY ANGIOGRAPHY;  Surgeon: Lorretta Harp, MD;  Location: [facility] CATH LAB;  Service: Cardiovascular;;  . ICD LEAD REMOVAL N/A 03/30/2015   Procedure: ICD LEAD REMOVAL;  Surgeon: Marzetta Board, MD;  Location: [facility] ORS;  Service: Cardiovascular;  Laterality: N/A;  . IMPLANTABLE CARDIOVERTER DEFIBRILLATOR IMPLANT    . INSERT / REPLACE / REMOVE PACEMAKER    . LEFT HEART CATHETERIZATION WITH CORONARY ANGIOGRAM N/A 12/09/2014   Procedure: LEFT HEART CATHETERIZATION WITH CORONARY ANGIOGRAM;  Surgeon: Burnell Blanks, MD;  Location: [facility] CATH LAB;  Service: Cardiovascular;  Laterality: N/A;   Family History  Problem Relation Age of Onset  . Hypertension Mother   . Congestive Heart Failure Mother   . Hypertension Sister   . Diabetes Sister   . Pancreatitis Sister   . COPD Sister   . Pancreatitis Brother   . Anemia Neg Hx   . Arrhythmia Neg Hx   . Asthma Neg Hx   . Clotting disorder Neg Hx   . Fainting Neg Hx   . Heart attack Neg Hx   . Heart disease Neg Hx   . Heart failure Neg Hx   . Hyperlipidemia Neg Hx    Social History   Tobacco Use  . Smoking status: Current Every Day Smoker    Packs/day: 0.50    Years: 24.00    Pack years: 12.00    Types: Cigarettes  . Smokeless tobacco: Never Used  . Tobacco comment: 4/5 Smoking 5 cigs a day  Substance Use Topics  . Alcohol use: No   Allergies  Allergen Reactions  . Bidil [Isosorb Dinitrate-Hydralazine] Other (See Comments)    Migraine Headache Migraine Headache  . Ciprofloxacin       Review of Systems  Constitutional: Positive for fatigue. Negative for appetite change.  HENT: Negative for congestion, postnasal drip and sore throat.   Eyes: Negative.   Respiratory: Positive for cough and shortness of breath. Negative for chest tightness and wheezing.   Cardiovascular: Positive for palpitations. Negative for chest pain and leg swelling.  Gastrointestinal: Positive for abdominal distention (at times) and abdominal pain (at times). Negative for diarrhea and nausea.  Endocrine: Negative.   Genitourinary: Negative.   Musculoskeletal: Negative for arthralgias and back pain.  Skin: Negative.   Allergic/Immunologic: Negative.   Neurological: Positive for light-headedness (at times). Negative for dizziness and headaches.  Hematological: Negative for adenopathy. Does not bruise/bleed easily.  Psychiatric/Behavioral: Negative for dysphoric mood, sleep disturbance (sleeping on 4 pillows) and suicidal ideas. The patient is not  nervous/anxious.      Physical Exam  Constitutional: He is oriented to person, place, and time. He appears well-developed and well-nourished.  HENT:  Head: Normocephalic and atraumatic.  Neck: No JVD present.  Cardiovascular: Normal rate and regular rhythm.  Pulmonary/Chest: Effort normal. He has no wheezes. He has no rales.  Abdominal: Soft. He exhibits no distension. There is no abdominal tenderness.  Musculoskeletal:        General: No tenderness or edema.     Cervical back: Normal range of motion and neck supple.  Neurological: He is alert and oriented to person, place, and time.  Skin: Skin is warm and dry.  Psychiatric: He has a normal mood and affect. His behavior is normal. Thought content normal.  Nursing note and vitals reviewed.    Assessment & Plan:  1: Chronic heart failure with reduced ejection fraction- - NYHA class II - euvolemic today - weighing daily; Reminded to call for an overnight weight gain of >2 pounds or a weekly weight gain of >5 pounds.  - weight 319 pounds from last visit here 1 month ago - patient says that he's been eating out more, just eating more and has been adding salt to his food - last saw cardiology [name]) 09/29/19 - saw [facility] 10/03/19  - metoprolol succinate resumed at 25mg  daily at his last visit - bidil gave him migraine headache - BNP on 05/18/17 was 156.0 - insulin dependent so may not be able to use farxiga - he does not take the flu vaccine; good handwashing encouraged  2: HTN- - BP  - sees PCP (Mancheno) at [place] from 08/02/2019 reviewed and shows sodium 136, potassium 4.1, creatinine 0.8 & GFR 127  3: Obstructive sleep apnea- - not wearing CPAP anymore due to comfort level  - continues wearing oxygen at 3L at bedtime    4: Diabetes- - fasting glucose at home was  - A1c from 09/12/16 was 10.9%  5: Tobacco use- - continues to smoke 1/2 ppd cigarettes daily - does remove himself from  the oxygen when smoking - complete cessation discussed for 3 minutes with him   Patient did not bring his medications nor a list. Emphasized that he needed to bring his medication bottles to every visit with him.

## 2019-10-11 ENCOUNTER — Telehealth: Payer: Self-pay | Admitting: Family

## 2019-10-11 ENCOUNTER — Ambulatory Visit: Payer: Medicare Other | Admitting: Family

## 2019-10-11 NOTE — Telephone Encounter (Signed)
Patient did not show for his [facility] appointment on 10/11/19. Will attempt to reschedule.

## 2019-10-20 ENCOUNTER — Ambulatory Visit: Payer: Medicare Other | Admitting: Cardiology

## 2019-10-21 ENCOUNTER — Ambulatory Visit: Payer: Medicare Other | Admitting: Cardiology

## 2019-10-24 ENCOUNTER — Encounter: Payer: Medicare Other | Admitting: Cardiology

## 2019-10-24 ENCOUNTER — Other Ambulatory Visit: Payer: Self-pay

## 2019-10-24 NOTE — Progress Notes (Signed)
This encounter was created in error - please disregard.

## 2019-11-03 ENCOUNTER — Other Ambulatory Visit: Payer: Self-pay

## 2019-11-03 ENCOUNTER — Encounter: Payer: Self-pay | Admitting: Podiatry

## 2019-11-03 ENCOUNTER — Ambulatory Visit (INDEPENDENT_AMBULATORY_CARE_PROVIDER_SITE_OTHER): Payer: Medicare Other | Admitting: Podiatry

## 2019-11-03 DIAGNOSIS — E119 Type 2 diabetes mellitus without complications: Secondary | ICD-10-CM | POA: Diagnosis not present

## 2019-11-03 DIAGNOSIS — Z794 Long term (current) use of insulin: Secondary | ICD-10-CM | POA: Diagnosis not present

## 2019-11-03 DIAGNOSIS — M79676 Pain in unspecified toe(s): Secondary | ICD-10-CM | POA: Diagnosis not present

## 2019-11-03 DIAGNOSIS — B351 Tinea unguium: Secondary | ICD-10-CM | POA: Diagnosis not present

## 2019-11-03 NOTE — Progress Notes (Signed)
Complaint:  Visit Type: Patient returns to my office for continued preventative foot care services. Complaint: Patient states" my nails have grown long and thick and become painful to walk and wear shoes" Patient has been diagnosed with DM with no foot complications. The patient presents for preventative foot care services. No changes to ROS  Podiatric Exam: Vascular: dorsalis pedis and posterior tibial pulses are palpable bilateral. Capillary return is immediate. Temperature gradient is WNL. Skin turgor WNL  Sensorium: Normal Semmes Weinstein monofilament test. Normal tactile sensation bilaterally. Nail Exam: Pt has thick disfigured discolored nails with subungual debris noted bilateral entire nail hallux through fifth toenails Ulcer Exam: There is no evidence of ulcer or pre-ulcerative changes or infection. Orthopedic Exam: Muscle tone and strength are WNL. No limitations in general ROM. No crepitus or effusions noted. Foot type and digits show no abnormalities. 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 Skin: No Porokeratosis. No infection or ulcers.  Heel calus  B/L.  Diagnosis:  Onychomycosis, , Pain in right toe, pain in left toes  Treatment & Plan Procedures and Treatment: Consent by patient was obtained for treatment procedures. The patient understood the discussion of treatment and procedures well. All questions were answered thoroughly reviewed. Debridement of mycotic and hypertrophic toenails, 1 through 5 bilateral and clearing of subungual debris. No ulceration, no infection noted.  Return Visit-Office Procedure: Patient instructed to return to the office for a follow up visit 10 weeks  for continued evaluation and treatment.    [name] DPM

## 2019-11-24 ENCOUNTER — Institutional Professional Consult (permissible substitution): Payer: Medicare Other | Admitting: Pulmonary Disease

## 2019-11-28 ENCOUNTER — Ambulatory Visit: Payer: Medicare Other | Admitting: Podiatry

## 2019-12-16 ENCOUNTER — Other Ambulatory Visit
Admission: RE | Admit: 2019-12-16 | Discharge: 2019-12-16 | Disposition: A | Discharge status: Home / Self Care | Payer: Medicare Other | Source: Ambulatory Visit | Attending: Student | Admitting: Student

## 2019-12-16 DIAGNOSIS — R0602 Shortness of breath: Secondary | ICD-10-CM | POA: Diagnosis present

## 2019-12-16 DIAGNOSIS — I5020 Unspecified systolic (congestive) heart failure: Secondary | ICD-10-CM | POA: Insufficient documentation

## 2019-12-16 LAB — BRAIN NATRIURETIC PEPTIDE: B Natriuretic Peptide: 189 pg/mL — ABNORMAL HIGH (ref 0.0–100.0)

## 2019-12-21 ENCOUNTER — Emergency Department
Admission: EM | Admit: 2019-12-21 | Discharge: 2019-12-21 | Discharge status: Against Medical Advice | Payer: Medicare Other | Attending: Student | Admitting: Student

## 2019-12-21 ENCOUNTER — Other Ambulatory Visit: Payer: Self-pay

## 2019-12-21 ENCOUNTER — Encounter: Payer: Self-pay | Admitting: Emergency Medicine

## 2019-12-21 DIAGNOSIS — I2510 Atherosclerotic heart disease of native coronary artery without angina pectoris: Secondary | ICD-10-CM | POA: Diagnosis not present

## 2019-12-21 DIAGNOSIS — Z95810 Presence of automatic (implantable) cardiac defibrillator: Secondary | ICD-10-CM | POA: Diagnosis not present

## 2019-12-21 DIAGNOSIS — Z79899 Other long term (current) drug therapy: Secondary | ICD-10-CM | POA: Insufficient documentation

## 2019-12-21 DIAGNOSIS — I110 Hypertensive heart disease with heart failure: Secondary | ICD-10-CM | POA: Diagnosis not present

## 2019-12-21 DIAGNOSIS — I5022 Chronic systolic (congestive) heart failure: Secondary | ICD-10-CM | POA: Insufficient documentation

## 2019-12-21 DIAGNOSIS — E119 Type 2 diabetes mellitus without complications: Secondary | ICD-10-CM | POA: Diagnosis not present

## 2019-12-21 DIAGNOSIS — J45909 Unspecified asthma, uncomplicated: Secondary | ICD-10-CM | POA: Insufficient documentation

## 2019-12-21 DIAGNOSIS — Z7982 Long term (current) use of aspirin: Secondary | ICD-10-CM | POA: Diagnosis not present

## 2019-12-21 DIAGNOSIS — F17210 Nicotine dependence, cigarettes, uncomplicated: Secondary | ICD-10-CM | POA: Diagnosis not present

## 2019-12-21 DIAGNOSIS — Z950 Presence of cardiac pacemaker: Secondary | ICD-10-CM | POA: Insufficient documentation

## 2019-12-21 DIAGNOSIS — R1013 Epigastric pain: Secondary | ICD-10-CM | POA: Insufficient documentation

## 2019-12-21 DIAGNOSIS — Z794 Long term (current) use of insulin: Secondary | ICD-10-CM | POA: Insufficient documentation

## 2019-12-21 LAB — COMPREHENSIVE METABOLIC PANEL
ALT: 25 U/L (ref 0–44)
AST: 27 U/L (ref 15–41)
Albumin: 3.6 g/dL (ref 3.5–5.0)
Alkaline Phosphatase: 166 U/L — ABNORMAL HIGH (ref 38–126)
Anion gap: 11 (ref 5–15)
BUN: 17 mg/dL (ref 6–20)
CO2: 31 mmol/L (ref 22–32)
Calcium: 8.7 mg/dL — ABNORMAL LOW (ref 8.9–10.3)
Chloride: 92 mmol/L — ABNORMAL LOW (ref 98–111)
Creatinine, Ser: 1.26 mg/dL — ABNORMAL HIGH (ref 0.61–1.24)
GFR calc Af Amer: >60 mL/min (ref >60)
GFR calc non Af Amer: >60 mL/min (ref >60)
Glucose, Bld: 271 mg/dL — ABNORMAL HIGH (ref 70–99)
Potassium: 4 mmol/L (ref 3.5–5.1)
Sodium: 134 mmol/L — ABNORMAL LOW (ref 135–145)
Total Bilirubin: 1 mg/dL (ref 0.3–1.2)
Total Protein: 8 g/dL (ref 6.5–8.1)

## 2019-12-21 LAB — CBC
HCT: 46 % (ref 39.0–52.0)
Hemoglobin: 16.2 g/dL (ref 13.0–17.0)
MCH: 33.1 pg (ref 26.0–34.0)
MCHC: 35.2 g/dL (ref 30.0–36.0)
MCV: 94.1 fL (ref 80.0–100.0)
Platelets: 161 10*3/uL (ref 150–400)
RBC: 4.89 MIL/uL (ref 4.22–5.81)
RDW: 12.1 % (ref 11.5–15.5)
WBC: 5.4 10*3/uL (ref 4.0–10.5)
nRBC: 0 % (ref 0.0–0.2)

## 2019-12-21 LAB — TROPONIN I (HIGH SENSITIVITY): Troponin I (High Sensitivity): 78 ng/L — ABNORMAL HIGH (ref <18)

## 2019-12-21 LAB — LIPASE, BLOOD: Lipase: 60 U/L — ABNORMAL HIGH (ref 11–51)

## 2019-12-21 MED ORDER — OXYCODONE HCL 5 MG PO TABS: 5.0000 mg | ORAL_TABLET | Freq: Four times a day (QID) | ORAL | 0 refills | Status: AC | PRN

## 2019-12-21 NOTE — Discharge Instructions (Addendum)
Thank you for letting us take care of you in the emergency department today.   Please continue to take any regular, prescribed medications.   New medications we have prescribed:  Oxycodone, take as needed for pain. Do not take and drive, drink alcohol, or take w/ any other sedating medications.  Please follow up with: Your cardiology and GI doctor to review your ER visit and follow up on your symptoms.    Please return to the ER for any new or worsening symptoms.

## 2019-12-21 NOTE — ED Notes (Signed)
Pt would like to leave AMA. Pt's father has recently died. EDP made aware.

## 2019-12-21 NOTE — ED Triage Notes (Signed)
Pt reports abd pain to his upper abd for the past 2 days. Pt also reports some nausea but denies vomiting.

## 2019-12-21 NOTE — ED Provider Notes (Signed)
 [facility] Emergency Department Provider Note  ____________________________________________   First MD Initiated Contact with Patient 12/21/19 1239     (approximate)  I have reviewed the triage vital signs and the nursing notes.  History  Chief Complaint Abdominal Pain and Nausea    HPI Oscar [name] is a 43 y.o. male past medical history as below, including CAD, MI, cardiomyopathy with AICD in place, GERD, DM, hx pancreatitis, who presents to the emergency department with concern for recurrent episode of pancreatitis.  Patient states he has had upper abdominal pain for the last 2 days.  Constant since onset.  Located in the epigastric region, upper abdomen.  7/10 in severity.  Described as a cramping, sharp-like sensation.  Worsened with eating.  Associated with nausea, but no vomiting.  No alleviating symptoms.  Denies any chest pain or shortness of breath.  States symptoms feel similar to prior episodes of pancreatitis.  Skipped his medications for the last few days due to the symptoms, but was able to tolerate them this morning.   Past Medical Hx Past Medical History:  Diagnosis Date  . AICD (automatic cardioverter/defibrillator) present   . Asthma   . Cardiomyopathy ([facility])   . CHF (congestive heart failure) ([facility])   . Coronary artery disease   . Deafness in right ear   . Diabetes mellitus without complication ([facility])   . Dilated cardiomyopathy ([facility])   . Dysrhythmia    svt  . 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 GERD (gastroesophageal reflux disease)   . Hyperlipidemia   . Hypertension   . Hypoxemia   . Hypoxemia   . Mild obesity   . Myocardial infarction ([facility])    2012,2014,2/16  . Pancreatitis   . Sleep apnea    osa    Problem List Patient Active Problem List   Diagnosis Date Noted  . Foreign body granuloma of skin and subcutaneous tissue 05/26/2019  . Acute pancreatitis 05/19/2017  . Pancreatitis 09/12/2016  . Bronchitis  05/20/2016  . Constipation 11/08/2015  . Abdominal pain 11/02/2015  . Obstructive sleep apnea 07/31/2015  . Tobacco use 07/31/2015  . Diabetes ([facility]) 07/10/2015  . Chronic systolic heart failure ([facility]) 03/30/2015  . Cardiac pacemaker 03/30/2015  . Chronic respiratory failure with hypercapnia ([facility]) 12/12/2014  . Elevated troponin   . Non-ischemic cardiomyopathy ([facility])   . Hypoxemia   . SVT (supraventricular tachycardia) ([facility]) 12/09/2014  . NSTEMI (non-ST elevated myocardial infarction) ([facility]) 12/09/2014  . Hypertension 12/09/2014    Past Surgical Hx Past Surgical History:  Procedure Laterality Date  . CARDIAC CATHETERIZATION  12/11/2014   Procedure: RIGHT/LEFT HEART CATH AND CORONARY ANGIOGRAPHY;  Surgeon: Jonathan J Berry, MD;  Location: [facility] CATH LAB;  Service: Cardiovascular;;  . ICD LEAD REMOVAL N/A 03/30/2015   Procedure: ICD LEAD REMOVAL;  Surgeon: Kevin L Thomas, MD;  Location: [facility] ORS;  Service: Cardiovascular;  Laterality: N/A;  . IMPLANTABLE CARDIOVERTER DEFIBRILLATOR IMPLANT    . INSERT / REPLACE / REMOVE PACEMAKER    . LEFT HEART CATHETERIZATION WITH CORONARY ANGIOGRAM N/A 12/09/2014   Procedure: LEFT HEART CATHETERIZATION WITH CORONARY ANGIOGRAM;  Surgeon: Christopher D McAlhany, MD;  Location: [facility] CATH LAB;  Service: Cardiovascular;  Laterality: N/A;    Medications Prior to Admission medications   Medication Sig Start Date End Date Taking? Authorizing Provider  Accu-Chek Softclix Lancets lancets USE TO CHECK BLOOD SUGAR THREE TIMES DAILY 05/20/19   [provider]  albuterol (VENTOLIN HFA) 108 (90   Base) MCG/ACT inhaler 2 inhalations every 6 (six) hours as needed 01/21/19   [provider]  allopurinol (ZYLOPRIM) 100 MG tablet Take by mouth. 11/09/18   [provider]  aluminum-magnesium hydroxide-simethicone (MAALOX) 200-200-20 MG/5ML SUSP Take 30 mLs by mouth 4 (four) times daily -  before meals and at bedtime. 05/18/18   Stafford, Phillip, MD   amLODipine (NORVASC) 5 MG tablet Take by mouth.    [provider]  aspirin EC 325 MG tablet Take 1 tablet (325 mg total) by mouth daily. 07/10/15   Walsh, Catherine P, MD  atorvastatin (LIPITOR) 40 MG tablet Take 1 tablet (40 mg total) by mouth daily at 6 PM. 11/04/18   Hackney, Tina A, FNP  chlorhexidine (HIBICLENS) 4 % external liquid Use as body was daily in the shower 04/18/19   [provider]  diazepam (VALIUM) 5 MG tablet Take 1 tablet 30 minutes before MRI. If needed, may take 1 tablet at time of MRI under supervision of radiology. 10/14/19   [provider]  doxycycline (VIBRA-TABS) 100 MG tablet Take 1 tablet (100 mg total) by mouth 2 (two) times daily. 05/26/19   Mayer, Gregory, DPM  famotidine (PEPCID) 20 MG tablet Take 1 tablet (20 mg total) by mouth 2 (two) times daily. 05/18/18   Stafford, Phillip, MD  furosemide (LASIX) 20 MG tablet Take by mouth. 09/22/19   [provider]  gabapentin (NEURONTIN) 300 MG capsule Take 300 mg by mouth 2 (two) times daily.    [provider]  insulin detemir (LEVEMIR FLEXTOUCH) 100 UNIT/ML FlexPen Inject into the skin. 11/10/18   [provider]  insulin glargine (LANTUS) 100 UNIT/ML injection Inject into the skin. 12/07/15   [provider]  insulin starter kit- syringes MISC 1 kit by Other route once. 07/10/15   Walsh, Catherine P, MD  levofloxacin (LEVAQUIN) 750 MG tablet Take by mouth. 09/21/19   [provider]  metoprolol succinate (TOPROL-XL) 25 MG 24 hr tablet Take by mouth. 10/06/19   [provider]  omeprazole (PRILOSEC) 40 MG capsule Take by mouth. 03/17/19 03/16/20  [provider]  ondansetron (ZOFRAN ODT) 8 MG disintegrating tablet Take 1 tablet (8 mg total) by mouth every 8 (eight) hours as needed for nausea or vomiting. 05/18/18   Stafford, Phillip, MD  oxyCODONE-acetaminophen (PERCOCET/ROXICET) 5-325 MG tablet Take by mouth. 02/04/18   [provider]   sacubitril-valsartan (ENTRESTO) 49-51 MG Take by mouth. 06/13/19   [provider]  spironolactone (ALDACTONE) 25 MG tablet Take by mouth. 09/19/16   [provider]  Syringe, Disposable, 1 ML MISC 0.1 mLs by Does not apply route as directed. 08/21/15   Hackney, Tina A, FNP  tiotropium (SPIRIVA) 18 MCG inhalation capsule Place 18 mcg into inhaler and inhale daily.    [provider]  torsemide (DEMADEX) 20 MG tablet Take 1 tablet (20 mg total) by mouth daily. 11/04/18   Hackney, Tina A, FNP  ULTICARE MINI PEN NEEDLES 31G X 6 MM MISC  11/10/18   [provider]  umeclidinium-vilanterol (ANORO ELLIPTA) 62.5-25 MCG/INH AEPB Inhale into the lungs. 01/31/19   [provider]  VICTOZA 18 MG/3ML SOPN  10/17/19   [provider]    Allergies Bidil [isosorb dinitrate-hydralazine] and Ciprofloxacin  Family Hx Family History  Problem Relation Age of Onset  . Hypertension Mother   . Congestive Heart Failure Mother   . Hypertension Sister   . Diabetes Sister   . Pancreatitis Sister   .   COPD Sister   . Pancreatitis Brother   . Anemia Neg Hx   . Arrhythmia Neg Hx   . Asthma Neg Hx   . Clotting disorder Neg Hx   . Fainting Neg Hx   . Heart attack Neg Hx   . Heart disease Neg Hx   . Heart failure Neg Hx   . Hyperlipidemia Neg Hx     Social Hx Social History   Tobacco Use  . Smoking status: Current Every Day Smoker    Packs/day: 0.50    Years: 24.00    Pack years: 12.00    Types: Cigarettes  . Smokeless tobacco: Never Used  . Tobacco comment: 4/5 Smoking 5 cigs a day  Substance Use Topics  . Alcohol use: No  . Drug use: No     Review of Systems  Constitutional: Negative for fever. Negative for chills. Eyes: Negative for visual changes. ENT: Negative for sore throat. Cardiovascular: Negative for chest pain. Respiratory: Negative for shortness of breath. Gastrointestinal: Positive for abdominal pain, nausea. Genitourinary:  Negative for dysuria. Musculoskeletal: Negative for leg swelling. Skin: Negative for rash. Neurological: Negative for headaches.   Physical Exam  Vital Signs: ED Triage Vitals [12/21/19 1020]  Enc Vitals Group     BP      Pulse      Resp      Temp 98.5 F (36.9 C)     Temp Source Oral     SpO2      Weight (!) 321 lb (145.6 kg)     Height 5' 6" (1.676 m)     Head Circumference      Peak Flow      Pain Score 7     Pain Loc      Pain Edu?      Excl. in GC?     Constitutional: Alert and oriented. Well appearing. NAD.  Head: Normocephalic. Atraumatic. Eyes: Conjunctivae clear. Sclera anicteric. Pupils equal and symmetric. Nose: No masses or lesions. No congestion or rhinorrhea. Mouth/Throat: Wearing mask.  Neck: No stridor. Trachea midline.  Cardiovascular: Normal rate, regular rhythm. Extremities well perfused. Respiratory: Normal respiratory effort.  Lungs CTAB. Gastrointestinal: Soft.  TTP in the epigastrium, no rebound, guarding, rigidity.  Remainder of abdomen is soft and nontender. Genitourinary: Deferred. Musculoskeletal: No lower extremity edema. No deformities. Neurologic:  Normal speech and language. No gross focal or lateralizing neurologic deficits are appreciated.  Skin: Skin is warm, dry and intact. No rash noted. Psychiatric: Mood and affect are appropriate for situation.  EKG  Personally reviewed and interpreted by myself.   Date: 12/21/19 Time: 1023 Rate: 92 Rhythm: sinus Axis: left Intervals: prolonged QTC, QRS 162 ms secondary to IVCD PVC Appears generally unchanged from prior No STEMI   Procedures  Procedure(s) performed (including critical care):  Procedures   Initial Impression / Assessment and Plan / MDM / ED Course  43 y.o. male who presents to the ED for upper abdominal, epigastric discomfort, similar to prior episodes of pancreatitis.  Ddx: pancreatitis, esophagitis, gastritis, GERD, atypical ACS  Labs initiated in triage  reveal very mildly decreased sodium and chloride, & slightly elevated creatinine consistent with decreased PO  intake in the setting of his abdominal pain.  Lipase mildly elevated suggestive of possible recurrent pancreatitis, which is consistent with his symptoms.  EKG appears largely unchanged from prior.  High-sensitivity troponin 78.  Patient has no prior high-sensitivity troponins to compare to, but on review of prior labs, his previous troponins have been >  0.07 previously, which may be consistent with his high-sensitivity troponin today.  However, given his risk factors, and no prior high-sensitivity's to compare to, did discuss further evaluation with at minimum a delta troponin and possible observation admission given his high risk cardiac history.  Unfortunately, the patient needs to leave because his father recently died and he has an appointment at a funeral home.  He understands the risks of leaving with incomplete evaluation, demonstrates competency and capacity.  He will leave AMA.  He has multiple close follow-up appointments already in place with his pulmonologist as well as his GI doctor, and will contact his cardiologist for further evaluation as well.  Given short prescription for pain control for likely pancreatitis and given return precautions.  _______________________________   As part of my medical decision making I have reviewed available labs, radiology tests, reviewed old records/performed chart review.    Final Clinical Impression(s) / ED Diagnosis  Final diagnoses:  Epigastric pain       Note:  This document was prepared using Dragon voice recognition software and may include unintentional dictation errors.   Lilia Pro., MD 12/22/19 [phone]

## 2019-12-22 ENCOUNTER — Other Ambulatory Visit: Payer: Self-pay

## 2019-12-22 ENCOUNTER — Institutional Professional Consult (permissible substitution): Payer: Medicare Other | Admitting: Pulmonary Disease

## 2019-12-28 NOTE — Progress Notes (Deleted)
Patient ID: Oscar [name], male    DOB: 1976-08-13, 44 y.o.   MRN: [id number]  HPI  Oscar [name] is a 44 y/o male with a history of obstructive sleep apnea (currently with CPAP), MI, HTN, hyperlipidemia, GERD, DM, SVT, asthma and chronic heart failure.  Echo done 03/01/2019 which showed an EF of 15% along with mild Oscar. Echo done 06/23/18 reviewed and showed an EF of 20-25% along with mild Oscar. Echo done 02/06/15 which showed an EF of 25% which is unchanged from previous echo on 12/10/14 which showed an EF of 25% and mild Oscar.   Was in the ED 12/21/19 due to abdominal pain and nausea. High sensitivity troponin was elevated but patient said he had to leave due to father's recent death and he opted to leave AMA.     He presents today for an acute visit with a chief complaint of    Past Medical History:  Diagnosis Date  . AICD (automatic cardioverter/defibrillator) present   . Asthma   . Cardiomyopathy ([place])   . CHF (congestive heart failure) ([place])   . Coronary artery disease   . Deafness in right ear   . Diabetes mellitus without complication ([place])   . Dilated cardiomyopathy ([place])   . Dysrhythmia    svt  . 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 GERD (gastroesophageal reflux disease)   . Hyperlipidemia   . Hypertension   . Hypoxemia   . Hypoxemia   . Mild obesity   . Myocardial infarction ([place])    [id number]  . Pancreatitis   . Sleep apnea    osa   Past Surgical History:  Procedure Laterality Date  . CARDIAC CATHETERIZATION  12/11/2014   Procedure: RIGHT/LEFT HEART CATH AND CORONARY ANGIOGRAPHY;  Surgeon: Lorretta Harp, MD;  Location: [facility] CATH LAB;  Service: Cardiovascular;;  . ICD LEAD REMOVAL N/A 03/30/2015   Procedure: ICD LEAD REMOVAL;  Surgeon: Marzetta Board, MD;  Location: [facility] ORS;  Service: Cardiovascular;  Laterality: N/A;  . IMPLANTABLE CARDIOVERTER DEFIBRILLATOR IMPLANT    . INSERT / REPLACE / REMOVE PACEMAKER    . LEFT HEART CATHETERIZATION WITH CORONARY  ANGIOGRAM N/A 12/09/2014   Procedure: LEFT HEART CATHETERIZATION WITH CORONARY ANGIOGRAM;  Surgeon: Burnell Blanks, MD;  Location: [facility] CATH LAB;  Service: Cardiovascular;  Laterality: N/A;   Family History  Problem Relation Age of Onset  . Hypertension Mother   . Congestive Heart Failure Mother   . Hypertension Sister   . Diabetes Sister   . Pancreatitis Sister   . COPD Sister   . Pancreatitis Brother   . Anemia Neg Hx   . Arrhythmia Neg Hx   . Asthma Neg Hx   . Clotting disorder Neg Hx   . Fainting Neg Hx   . Heart attack Neg Hx   . Heart disease Neg Hx   . Heart failure Neg Hx   . Hyperlipidemia Neg Hx    Social History   Tobacco Use  . Smoking status: Current Every Day Smoker    Packs/day: 0.50    Years: 24.00    Pack years: 12.00    Types: Cigarettes  . Smokeless tobacco: Never Used  . Tobacco comment: 4/5 Smoking 5 cigs a day  Substance Use Topics  . Alcohol use: No   Allergies  Allergen Reactions  . Bidil [Isosorb Dinitrate-Hydralazine] Other (See Comments)    Migraine Headache Migraine Headache  . Ciprofloxacin  Review of Systems  Constitutional: Positive for fatigue. Negative for appetite change.  HENT: Negative for congestion, postnasal drip and sore throat.   Eyes: Negative.   Respiratory: Positive for cough and shortness of breath. Negative for chest tightness and wheezing.   Cardiovascular: Positive for palpitations. Negative for chest pain and leg swelling.  Gastrointestinal: Positive for abdominal distention (at times) and abdominal pain (at times). Negative for diarrhea and nausea.  Endocrine: Negative.   Genitourinary: Negative.   Musculoskeletal: Negative for arthralgias and back pain.  Skin: Negative.   Allergic/Immunologic: Negative.   Neurological: Positive for light-headedness (at times). Negative for dizziness and headaches.  Hematological: Negative for adenopathy. Does not bruise/bleed easily.  Psychiatric/Behavioral:  Negative for dysphoric mood, sleep disturbance (sleeping on 4 pillows) and suicidal ideas. The patient is not nervous/anxious.      Physical Exam  Constitutional: He is oriented to person, place, and time. He appears well-developed and well-nourished.  HENT:  Head: Normocephalic and atraumatic.  Neck: No JVD present.  Cardiovascular: Normal rate and regular rhythm.  Pulmonary/Chest: Effort normal. He has no wheezes. He has no rales.  Abdominal: Soft. He exhibits no distension. There is no abdominal tenderness.  Musculoskeletal:        General: No tenderness or edema.     Cervical back: Normal range of motion and neck supple.  Neurological: He is alert and oriented to person, place, and time.  Skin: Skin is warm and dry.  Psychiatric: He has a normal mood and affect. His behavior is normal. Thought content normal.  Nursing note and vitals reviewed.    Assessment & Plan:  1: Chronic heart failure with reduced ejection fraction- - NYHA class II - euvolemic today - weighing daily; Reminded to call for an overnight weight gain of >2 pounds or a weekly weight gain of >5 pounds.  - weight 319 pounds from last visit here 3 months ago - patient says that he's been eating out more, just eating more and has been adding salt to his food - last saw cardiology Dema Severin) 12/16/19  - bidil gave him migraine headache - BNP on 12/16/19 was 189.0 - insulin dependent so may not be able to use farxiga - AICD in place  2: HTN- - BP  - sees PCP [name]) at [place] from 12/21/19 reviewed and shows sodium 134, potassium 4.0, creatinine 1.26 & GFR >60  3: Obstructive sleep apnea- - not wearing CPAP anymore due to comfort level  - continues wearing oxygen at 3L at bedtime    4: Diabetes- - fasting glucose at home was  - A1c from 09/12/16 was 10.9%  5: Tobacco use- - continues to smoke 1/2 ppd cigarettes daily - does remove himself from the oxygen when smoking -  complete cessation discussed for 3 minutes with him   Patient did not bring his medications nor a list. Emphasized that he needed to bring his medication bottles to every visit with him.

## 2019-12-29 ENCOUNTER — Ambulatory Visit: Payer: Medicare Other | Admitting: Family

## 2019-12-29 ENCOUNTER — Other Ambulatory Visit: Payer: Self-pay

## 2019-12-29 ENCOUNTER — Emergency Department
Admission: EM | Admit: 2019-12-29 | Discharge: 2019-12-29 | Disposition: A | Discharge status: Home / Self Care | Payer: Medicare Other | Attending: Emergency Medicine | Admitting: Emergency Medicine

## 2019-12-29 DIAGNOSIS — Z79899 Other long term (current) drug therapy: Secondary | ICD-10-CM | POA: Diagnosis not present

## 2019-12-29 DIAGNOSIS — I5022 Chronic systolic (congestive) heart failure: Secondary | ICD-10-CM | POA: Diagnosis not present

## 2019-12-29 DIAGNOSIS — J45909 Unspecified asthma, uncomplicated: Secondary | ICD-10-CM | POA: Insufficient documentation

## 2019-12-29 DIAGNOSIS — F17210 Nicotine dependence, cigarettes, uncomplicated: Secondary | ICD-10-CM | POA: Diagnosis not present

## 2019-12-29 DIAGNOSIS — Z794 Long term (current) use of insulin: Secondary | ICD-10-CM | POA: Diagnosis not present

## 2019-12-29 DIAGNOSIS — Z7982 Long term (current) use of aspirin: Secondary | ICD-10-CM | POA: Diagnosis not present

## 2019-12-29 DIAGNOSIS — Z95810 Presence of automatic (implantable) cardiac defibrillator: Secondary | ICD-10-CM | POA: Diagnosis not present

## 2019-12-29 DIAGNOSIS — I110 Hypertensive heart disease with heart failure: Secondary | ICD-10-CM | POA: Insufficient documentation

## 2019-12-29 DIAGNOSIS — E119 Type 2 diabetes mellitus without complications: Secondary | ICD-10-CM | POA: Diagnosis not present

## 2019-12-29 DIAGNOSIS — R1013 Epigastric pain: Secondary | ICD-10-CM | POA: Diagnosis present

## 2019-12-29 DIAGNOSIS — R109 Unspecified abdominal pain: Secondary | ICD-10-CM

## 2019-12-29 LAB — COMPREHENSIVE METABOLIC PANEL
ALT: 27 U/L (ref 0–44)
AST: 31 U/L (ref 15–41)
Albumin: 3.9 g/dL (ref 3.5–5.0)
Alkaline Phosphatase: 146 U/L — ABNORMAL HIGH (ref 38–126)
Anion gap: 11 (ref 5–15)
BUN: 25 mg/dL — ABNORMAL HIGH (ref 6–20)
CO2: 27 mmol/L (ref 22–32)
Calcium: 8.6 mg/dL — ABNORMAL LOW (ref 8.9–10.3)
Chloride: 94 mmol/L — ABNORMAL LOW (ref 98–111)
Creatinine, Ser: 2.09 mg/dL — ABNORMAL HIGH (ref 0.61–1.24)
GFR calc Af Amer: 43 mL/min — ABNORMAL LOW (ref >60)
GFR calc non Af Amer: 37 mL/min — ABNORMAL LOW (ref >60)
Glucose, Bld: 171 mg/dL — ABNORMAL HIGH (ref 70–99)
Potassium: 3.8 mmol/L (ref 3.5–5.1)
Sodium: 132 mmol/L — ABNORMAL LOW (ref 135–145)
Total Bilirubin: 1.2 mg/dL (ref 0.3–1.2)
Total Protein: 8.5 g/dL — ABNORMAL HIGH (ref 6.5–8.1)

## 2019-12-29 LAB — URINALYSIS, COMPLETE (UACMP) WITH MICROSCOPIC
Glucose, UA: NEGATIVE mg/dL
Hgb urine dipstick: NEGATIVE
Ketones, ur: NEGATIVE mg/dL
Leukocytes,Ua: NEGATIVE
Nitrite: NEGATIVE
Protein, ur: >300 mg/dL — AB
Specific Gravity, Urine: 1.015 (ref 1.005–1.030)
pH: 5 (ref 5.0–8.0)

## 2019-12-29 LAB — CBC
HCT: 47.3 % (ref 39.0–52.0)
Hemoglobin: 17.2 g/dL — ABNORMAL HIGH (ref 13.0–17.0)
MCH: 33.5 pg (ref 26.0–34.0)
MCHC: 36.4 g/dL — ABNORMAL HIGH (ref 30.0–36.0)
MCV: 92.2 fL (ref 80.0–100.0)
Platelets: 200 10*3/uL (ref 150–400)
RBC: 5.13 MIL/uL (ref 4.22–5.81)
RDW: 12 % (ref 11.5–15.5)
WBC: 6.2 10*3/uL (ref 4.0–10.5)
nRBC: 0 % (ref 0.0–0.2)

## 2019-12-29 LAB — LIPASE, BLOOD: Lipase: 69 U/L — ABNORMAL HIGH (ref 11–51)

## 2019-12-29 MED ORDER — LIDOCAINE VISCOUS HCL 2 % MT SOLN
15.0000 mL | Freq: Once | OROMUCOSAL | Status: AC
  Administered 2019-12-29: 15 mL via OROMUCOSAL
  Filled 2019-12-29: qty 15

## 2019-12-29 MED ORDER — SUCRALFATE 1 G PO TABS: 1.0000 g | ORAL_TABLET | Freq: Four times a day (QID) | ORAL | 0 refills | Status: DC

## 2019-12-29 MED ORDER — SODIUM CHLORIDE 0.9 % IV BOLUS
1000.0000 mL | Freq: Once | INTRAVENOUS | Status: AC
  Administered 2019-12-29: 11:00:00 1000 mL via INTRAVENOUS

## 2019-12-29 MED ORDER — LIDOCAINE VISCOUS HCL 2 % MT SOLN: 15.0000 mL | Freq: Four times a day (QID) | OROMUCOSAL | 0 refills | Status: DC | PRN

## 2019-12-29 NOTE — ED Provider Notes (Signed)
[facility] Emergency Department Provider Note  ____________________________________________   I have reviewed the triage vital signs and the nursing notes.   HISTORY  Chief Complaint Abdominal Pain   History limited by: Not Limited   HPI Oscar [name] is a 44 y.o. male who presents to the emergency department today because of concerns for continued abdominal pain.  He is now had the pain for roughly 2 weeks.  Is located in the central upper abdomen.  It becomes worse and feels sharp after he eats.  He was seen in the emergency department for this and was diagnosed with recurrent pancreatitis.  He states he is now finished the pain medication he was prescribed.  He states he has spoken to his GI doctor since then they have scheduled him for outpatient imaging to try to further investigate why he has recurrent pancreatitis.  Patient denies any fevers.  Records reviewed. Per medical record review patient has a history of recent ER visit, diagnosed with pancreatitis.   Past Medical History:  Diagnosis Date  . AICD (automatic cardioverter/defibrillator) present   . Asthma   . Cardiomyopathy ([place])   . CHF (congestive heart failure) ([place])   . Coronary artery disease   . Deafness in right ear   . Diabetes mellitus without complication ([place])   . Dilated cardiomyopathy ([place])   . Dysrhythmia    svt  . 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 GERD (gastroesophageal reflux disease)   . Hyperlipidemia   . Hypertension   . Hypoxemia   . Hypoxemia   . Mild obesity   . Myocardial infarction ([place])    [id number]/26  . Pancreatitis   . Sleep apnea    osa    Patient Active Problem List   Diagnosis Date Noted  . Foreign body granuloma of skin and subcutaneous tissue 05/26/2019  . Acute pancreatitis 05/19/2017  . Pancreatitis 09/12/2016  . Bronchitis 05/20/2016  . Constipation 11/08/2015  . Abdominal pain 11/02/2015  . Obstructive sleep apnea  07/31/2015  . Tobacco use 07/31/2015  . Diabetes ([place]) 07/10/2015  . Chronic systolic heart failure ([place]) 03/30/2015  . Cardiac pacemaker 03/30/2015  . Chronic respiratory failure with hypercapnia ([place]) 12/12/2014  . Elevated troponin   . Non-ischemic cardiomyopathy ([place])   . Hypoxemia   . SVT (supraventricular tachycardia) ([place]) 12/09/2014  . NSTEMI (non-ST elevated myocardial infarction) ([place]) 12/09/2014  . Hypertension 12/09/2014    Past Surgical History:  Procedure Laterality Date  . CARDIAC CATHETERIZATION  12/11/2014   Procedure: RIGHT/LEFT HEART CATH AND CORONARY ANGIOGRAPHY;  Surgeon: Lorretta Harp, MD;  Location: [facility] CATH LAB;  Service: Cardiovascular;;  . ICD LEAD REMOVAL N/A 03/30/2015   Procedure: ICD LEAD REMOVAL;  Surgeon: Marzetta Board, MD;  Location: [facility] ORS;  Service: Cardiovascular;  Laterality: N/A;  . IMPLANTABLE CARDIOVERTER DEFIBRILLATOR IMPLANT    . INSERT / REPLACE / REMOVE PACEMAKER    . LEFT HEART CATHETERIZATION WITH CORONARY ANGIOGRAM N/A 12/09/2014   Procedure: LEFT HEART CATHETERIZATION WITH CORONARY ANGIOGRAM;  Surgeon: Burnell Blanks, MD;  Location: [facility] CATH LAB;  Service: Cardiovascular;  Laterality: N/A;    Prior to Admission medications   Medication Sig Start Date End Date Taking? Authorizing Provider  Accu-Chek Softclix Lancets lancets USE TO CHECK BLOOD SUGAR THREE TIMES DAILY 05/20/19   [provider]  albuterol (VENTOLIN HFA) 108 (90 Base) MCG/ACT inhaler 2 inhalations every 6 (six) hours as needed 01/21/19   [provider]  allopurinol (ZYLOPRIM) 100 MG tablet Take by mouth. 11/09/18   [provider]  aluminum-magnesium hydroxide-simethicone (MAALOX) 591-638-46 MG/5ML SUSP Take 30 mLs by mouth 4 (four) times daily -  before meals and at bedtime. 05/18/18   Carrie Mew, MD  amLODipine (NORVASC) 5 MG tablet Take by mouth.    [provider]  aspirin EC 325 MG tablet Take 1 tablet (325 mg total) by  mouth daily. 07/10/15   Aldean Jewett, MD  atorvastatin (LIPITOR) 40 MG tablet Take 1 tablet (40 mg total) by mouth daily at 6 PM. 11/04/18   Alisa Graff, FNP  chlorhexidine (HIBICLENS) 4 % external liquid Use as body was daily in the shower 04/18/19   [provider]  diazepam (VALIUM) 5 MG tablet Take 1 tablet 30 minutes before MRI. If needed, may take 1 tablet at time of MRI under supervision of radiology. 10/14/19   [provider]  doxycycline (VIBRA-TABS) 100 MG tablet Take 1 tablet (100 mg total) by mouth 2 (two) times daily. 05/26/19   [name], DPM  famotidine (PEPCID) 20 MG tablet Take 1 tablet (20 mg total) by mouth 2 (two) times daily. 05/18/18   Carrie Mew, MD  furosemide (LASIX) 20 MG tablet Take by mouth. 09/22/19   [provider]  gabapentin (NEURONTIN) 300 MG capsule Take 300 mg by mouth 2 (two) times daily.    [provider]  insulin detemir (LEVEMIR FLEXTOUCH) 100 UNIT/ML FlexPen Inject into the skin. 11/10/18   [provider]  insulin glargine (LANTUS) 100 UNIT/ML injection Inject into the skin. 12/07/15   [provider]  insulin starter kit- syringes MISC 1 kit by Other route once. 07/10/15   Aldean Jewett, MD  levofloxacin (LEVAQUIN) 750 MG tablet Take by mouth. 09/21/19   [provider]  metoprolol succinate (TOPROL-XL) 25 MG 24 hr tablet Take by mouth. 10/06/19   [provider]  omeprazole (PRILOSEC) 40 MG capsule Take by mouth. 03/17/19 03/16/20  [provider]  ondansetron (ZOFRAN ODT) 8 MG disintegrating tablet Take 1 tablet (8 mg total) by mouth every 8 (eight) hours as needed for nausea or vomiting. 05/18/18   Carrie Mew, MD  oxyCODONE-acetaminophen (PERCOCET/ROXICET) 5-325 MG tablet Take by mouth. 02/04/18   [provider]  sacubitril-valsartan (ENTRESTO) 49-51 MG Take by mouth. 06/13/19   [provider]  spironolactone (ALDACTONE) 25 MG tablet Take  by mouth. 09/19/16   [provider]  Syringe, Disposable, 1 ML MISC 0.1 mLs by Does not apply route as directed. 08/21/15   Alisa Graff, FNP  tiotropium (SPIRIVA) 18 MCG inhalation capsule Place 18 mcg into inhaler and inhale daily.    [provider]  torsemide (DEMADEX) 20 MG tablet Take 1 tablet (20 mg total) by mouth daily. 11/04/18   Alisa Graff, FNP  ULTICARE MINI PEN NEEDLES 31G X 6 MM MISC  11/10/18   [provider]  umeclidinium-vilanterol (ANORO ELLIPTA) 62.5-25 MCG/INH AEPB Inhale into the lungs. 01/31/19   [provider]  [place] 18 MG/3ML SOPN  10/17/19   [provider]    Allergies Bidil [isosorb dinitrate-hydralazine] and Ciprofloxacin  Family History  Problem Relation Age of Onset  . Hypertension Mother   . Congestive Heart Failure Mother   . Hypertension Sister   . Diabetes Sister   . Pancreatitis Sister   . COPD Sister   . Pancreatitis Brother   . Anemia Neg Hx   . Arrhythmia Neg Hx   .  Asthma Neg Hx   . Clotting disorder Neg Hx   . Fainting Neg Hx   . Heart attack Neg Hx   . Heart disease Neg Hx   . Heart failure Neg Hx   . Hyperlipidemia Neg Hx     Social History Social History   Tobacco Use  . Smoking status: Current Every Day Smoker    Packs/day: 0.50    Years: 24.00    Pack years: 12.00    Types: Cigarettes  . Smokeless tobacco: Never Used  . Tobacco comment: 4/5 Smoking 5 cigs a day  Substance Use Topics  . Alcohol use: No  . Drug use: No    Review of Systems Constitutional: No fever/chills Eyes: No visual changes. ENT: No sore throat. Cardiovascular: Denies chest pain. Respiratory: Denies shortness of breath. Gastrointestinal: Positive for abdominal pain. Genitourinary: Negative for dysuria. Musculoskeletal: Negative for back pain. Skin: Negative for rash. Neurological: Negative for headaches, focal weakness or numbness.  ____________________________________________   PHYSICAL  EXAM:  VITAL SIGNS: ED Triage Vitals  Enc Vitals Group     BP 12/29/19 0918 130/85     Pulse Rate 12/29/19 0914 84     Resp 12/29/19 0914 18     Temp 12/29/19 0914 97.6 F (36.4 C)     Temp Source 12/29/19 0914 Oral     SpO2 12/29/19 0914 94 %     Weight 12/29/19 0914 (!) 308 lb (139.7 kg)     Height 12/29/19 0914 5' 6"  (1.676 m)     Head Circumference --      Peak Flow --      Pain Score 12/29/19 0914 5   Constitutional: Alert and oriented.  Eyes: Conjunctivae are normal.  ENT      Head: Normocephalic and atraumatic.      Nose: No congestion/rhinnorhea.      Mouth/Throat: Mucous membranes are moist.      Neck: No stridor. Hematological/Lymphatic/Immunilogical: No cervical lymphadenopathy. Cardiovascular: Normal rate, regular rhythm.  No murmurs, rubs, or gallops.  Respiratory: Normal respiratory effort without tachypnea nor retractions. Breath sounds are clear and equal bilaterally. No wheezes/rales/rhonchi. Gastrointestinal: Soft and non tender. No rebound. No guarding.  Genitourinary: Deferred Musculoskeletal: Normal range of motion in all extremities. No lower extremity edema. Neurologic:  Normal speech and language. No gross focal neurologic deficits are appreciated.  Skin:  Skin is warm, dry and intact. No rash noted. Psychiatric: Mood and affect are normal. Speech and behavior are normal. Patient exhibits appropriate insight and judgment.  ____________________________________________    LABS (pertinent positives/negatives)  Lipase 69 CBC wbc 6.2, hgb 17.2, plt 200 CMP na 132, k 3.8, glu 171, cr 2.09, alk phos 146 UA without concerning signs of infection ____________________________________________   EKG  I, Nance Pear, attending physician, personally viewed and interpreted this EKG  EKG Time: 1003 Rate: 84 Rhythm: sinus rhythm Axis: left axis deviation Intervals: qtc 518 QRS: LBBB ST changes: no st elevation Impression: abnormal  ekg   ____________________________________________    RADIOLOGY  None  ____________________________________________   PROCEDURES  Procedures  ____________________________________________   INITIAL IMPRESSION / ASSESSMENT AND PLAN / ED COURSE  Pertinent labs & imaging results that were available during my care of the patient were reviewed by me and considered in my medical decision making (see chart for details).   Patient presented to the emergency department today with continued epigastric abdominal pain.  Patient states he has a history of acute pancreatitis.  Lipase today was minimally elevated  at 33.  Blood work does show some elevation of his creatinine in decrease of his sodium and chloride consistent with dehydration.  He was given IV fluids here.  Was also given viscous lidocaine which patient stated helped with his pain.  At this point I do wonder if he might be suffering more from gastritis/ulcerative disease.  Discussed this with the patient.  Will discharge with sucralfate and viscous lidocaine.  Patient already follows up with GI doctor.  ____________________________________________   FINAL CLINICAL IMPRESSION(S) / ED DIAGNOSES  Final diagnoses:  Abdominal pain, unspecified abdominal location     Note: This dictation was prepared with Dragon dictation. Any transcriptional errors that result from this process are unintentional     Nance Pear, MD 12/29/19 1302

## 2019-12-29 NOTE — ED Triage Notes (Addendum)
Pt here with abd pain that started 2 weeks ago and has not improved since last visit. Pt also c/o of cyst in groin area that is causing pain. Pt in no distress.

## 2019-12-29 NOTE — Discharge Instructions (Addendum)
Please seek medical attention for any high fevers, chest pain, shortness of breath, change in behavior, persistent vomiting, bloody stool or any other new or concerning symptoms.  

## 2019-12-29 NOTE — ED Notes (Signed)
Unhooked pt to use restroom.

## 2019-12-29 NOTE — ED Notes (Signed)
ED Provider at bedside. 

## 2020-01-03 NOTE — Progress Notes (Deleted)
Patient ID: Oscar [name], male    DOB: 24-Nov-1975, 44 y.o.   MRN: [id number]  HPI  Oscar [name] is a 44 y/o male with a history of obstructive sleep apnea (currently with CPAP), MI, HTN, hyperlipidemia, GERD, DM, SVT, asthma and chronic heart failure.  Echo done 03/01/2019 which showed an EF of 15% along with mild Oscar. Echo done 06/23/18 reviewed and showed an EF of 20-25% along with mild Oscar. Echo done 02/06/15 which showed an EF of 25% which is unchanged from previous echo on 12/10/14 which showed an EF of 25% and mild Oscar.   Was in the ED 12/29/19 due to abdominal pain. Given IV fluids due to renal function. Lipase minimally elevated. Medications adjusted and he was released. Was in the ED 12/21/19 due to abdominal pain and nausea. High sensitivity troponin was elevated but patient said he had to leave due to father's recent death and he opted to leave AMA.     He presents today for an acute visit with a chief complaint of    Past Medical History:  Diagnosis Date  . AICD (automatic cardioverter/defibrillator) present   . Asthma   . Cardiomyopathy ([place])   . CHF (congestive heart failure) ([place])   . Coronary artery disease   . Deafness in right ear   . Diabetes mellitus without complication ([place])   . Dilated cardiomyopathy ([place])   . Dysrhythmia    svt  . 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 GERD (gastroesophageal reflux disease)   . Hyperlipidemia   . Hypertension   . Hypoxemia   . Hypoxemia   . Mild obesity   . Myocardial infarction ([place])    [id number]  . Pancreatitis   . Sleep apnea    osa   Past Surgical History:  Procedure Laterality Date  . CARDIAC CATHETERIZATION  12/11/2014   Procedure: RIGHT/LEFT HEART CATH AND CORONARY ANGIOGRAPHY;  Surgeon: Oscar Harp, MD;  Location: [facility] CATH LAB;  Service: Cardiovascular;;  . ICD LEAD REMOVAL N/A 03/30/2015   Procedure: ICD LEAD REMOVAL;  Surgeon: Oscar Board, MD;  Location: [facility] ORS;  Service: Cardiovascular;   Laterality: N/A;  . IMPLANTABLE CARDIOVERTER DEFIBRILLATOR IMPLANT    . INSERT / REPLACE / REMOVE PACEMAKER    . LEFT HEART CATHETERIZATION WITH CORONARY ANGIOGRAM N/A 12/09/2014   Procedure: LEFT HEART CATHETERIZATION WITH CORONARY ANGIOGRAM;  Surgeon: Oscar Blanks, MD;  Location: [facility] CATH LAB;  Service: Cardiovascular;  Laterality: N/A;   Family History  Problem Relation Age of Onset  . Hypertension Mother   . Congestive Heart Failure Mother   . Hypertension Sister   . Diabetes Sister   . Pancreatitis Sister   . COPD Sister   . Pancreatitis Brother   . Anemia Neg Hx   . Arrhythmia Neg Hx   . Asthma Neg Hx   . Clotting disorder Neg Hx   . Fainting Neg Hx   . Heart attack Neg Hx   . Heart disease Neg Hx   . Heart failure Neg Hx   . Hyperlipidemia Neg Hx    Social History   Tobacco Use  . Smoking status: Current Every Day Smoker    Packs/day: 0.50    Years: 24.00    Pack years: 12.00    Types: Cigarettes  . Smokeless tobacco: Never Used  . Tobacco comment: 4/5 Smoking 5 cigs a day  Substance Use Topics  . Alcohol use: No   Allergies  Allergen Reactions  . Bidil [Isosorb Dinitrate-Hydralazine] Other (See Comments)    Migraine Headache Migraine Headache  . Ciprofloxacin       Review of Systems  Constitutional: Positive for fatigue. Negative for appetite change.  HENT: Negative for congestion, postnasal drip and sore throat.   Eyes: Negative.   Respiratory: Positive for cough and shortness of breath. Negative for chest tightness and wheezing.   Cardiovascular: Positive for palpitations. Negative for chest pain and leg swelling.  Gastrointestinal: Positive for abdominal distention (at times) and abdominal pain (at times). Negative for diarrhea and nausea.  Endocrine: Negative.   Genitourinary: Negative.   Musculoskeletal: Negative for arthralgias and back pain.  Skin: Negative.   Allergic/Immunologic: Negative.   Neurological: Positive for  light-headedness (at times). Negative for dizziness and headaches.  Hematological: Negative for adenopathy. Does not bruise/bleed easily.  Psychiatric/Behavioral: Negative for dysphoric mood, sleep disturbance (sleeping on 4 pillows) and suicidal ideas. The patient is not nervous/anxious.      Physical Exam  Constitutional: He is oriented to person, place, and time. He appears well-developed and well-nourished.  HENT:  Head: Normocephalic and atraumatic.  Neck: No JVD present.  Cardiovascular: Normal rate and regular rhythm.  Pulmonary/Chest: Effort normal. He has no wheezes. He has no rales.  Abdominal: Soft. He exhibits no distension. There is no abdominal tenderness.  Musculoskeletal:        General: No tenderness or edema.     Cervical back: Normal range of motion and neck supple.  Neurological: He is alert and oriented to person, place, and time.  Skin: Skin is warm and dry.  Psychiatric: He has a normal mood and affect. His behavior is normal. Thought content normal.  Nursing note and vitals reviewed.    Assessment & Plan:  1: Chronic heart failure with reduced ejection fraction- - NYHA class II - euvolemic today - weighing daily; Reminded to call for an overnight weight gain of >2 pounds or a weekly weight gain of >5 pounds.  - weight 319 pounds from last visit here 3 months ago - patient says that he's been eating out more, just eating more and has been adding salt to his food - last saw cardiology Oscar [name]) [name] - bidil gave him migraine headache - BNP on [name] was 189.0 - insulin dependent so may not be able to use farxiga - AICD in place  2: HTN- - BP  - sees PCP Oscar [name]) at [place] from 12/29/19 reviewed and shows sodium 132, potassium 3.8, creatinine 2.09 & GFR 43 - repeat BMP today (patient given IVF during recent ED visit)  3: Obstructive sleep apnea- - not wearing CPAP anymore due to comfort level  - continues wearing  oxygen at 3L at bedtime    4: Diabetes- - fasting glucose at home was  - A1c from 09/12/16 was 10.9%  5: Tobacco use- - continues to smoke 1/2 ppd cigarettes daily - does remove himself from the oxygen when smoking - complete cessation discussed for 3 minutes with him   Patient did not bring his medications nor a list. Emphasized that he needed to bring his medication bottles to every visit with him.

## 2020-01-04 ENCOUNTER — Ambulatory Visit: Payer: Medicare Other | Admitting: Family

## 2020-01-06 ENCOUNTER — Other Ambulatory Visit: Payer: Self-pay

## 2020-01-06 ENCOUNTER — Emergency Department: Payer: Medicare Other

## 2020-01-06 ENCOUNTER — Emergency Department
Admission: EM | Admit: 2020-01-06 | Discharge: 2020-01-06 | Disposition: A | Discharge status: Home / Self Care | Payer: Medicare Other | Attending: Emergency Medicine | Admitting: Emergency Medicine

## 2020-01-06 ENCOUNTER — Encounter: Payer: Self-pay | Admitting: Emergency Medicine

## 2020-01-06 DIAGNOSIS — E119 Type 2 diabetes mellitus without complications: Secondary | ICD-10-CM | POA: Insufficient documentation

## 2020-01-06 DIAGNOSIS — I2510 Atherosclerotic heart disease of native coronary artery without angina pectoris: Secondary | ICD-10-CM | POA: Insufficient documentation

## 2020-01-06 DIAGNOSIS — Z95810 Presence of automatic (implantable) cardiac defibrillator: Secondary | ICD-10-CM | POA: Diagnosis not present

## 2020-01-06 DIAGNOSIS — R1013 Epigastric pain: Secondary | ICD-10-CM

## 2020-01-06 DIAGNOSIS — Z794 Long term (current) use of insulin: Secondary | ICD-10-CM | POA: Diagnosis not present

## 2020-01-06 DIAGNOSIS — I5022 Chronic systolic (congestive) heart failure: Secondary | ICD-10-CM | POA: Insufficient documentation

## 2020-01-06 DIAGNOSIS — Z79899 Other long term (current) drug therapy: Secondary | ICD-10-CM | POA: Diagnosis not present

## 2020-01-06 DIAGNOSIS — F17210 Nicotine dependence, cigarettes, uncomplicated: Secondary | ICD-10-CM | POA: Insufficient documentation

## 2020-01-06 DIAGNOSIS — I110 Hypertensive heart disease with heart failure: Secondary | ICD-10-CM | POA: Insufficient documentation

## 2020-01-06 DIAGNOSIS — J45909 Unspecified asthma, uncomplicated: Secondary | ICD-10-CM | POA: Insufficient documentation

## 2020-01-06 DIAGNOSIS — I252 Old myocardial infarction: Secondary | ICD-10-CM | POA: Insufficient documentation

## 2020-01-06 DIAGNOSIS — K279 Peptic ulcer, site unspecified, unspecified as acute or chronic, without hemorrhage or perforation: Secondary | ICD-10-CM

## 2020-01-06 LAB — CBC
HCT: 48.2 % (ref 39.0–52.0)
Hemoglobin: 17.8 g/dL — ABNORMAL HIGH (ref 13.0–17.0)
MCH: 33.5 pg (ref 26.0–34.0)
MCHC: 36.9 g/dL — ABNORMAL HIGH (ref 30.0–36.0)
MCV: 90.6 fL (ref 80.0–100.0)
Platelets: 153 10*3/uL (ref 150–400)
RBC: 5.32 MIL/uL (ref 4.22–5.81)
RDW: 11.9 % (ref 11.5–15.5)
WBC: 4.4 10*3/uL (ref 4.0–10.5)
nRBC: 0 % (ref 0.0–0.2)

## 2020-01-06 LAB — URINALYSIS, COMPLETE (UACMP) WITH MICROSCOPIC
Bacteria, UA: NONE SEEN
Bilirubin Urine: NEGATIVE
Glucose, UA: NEGATIVE mg/dL
Hgb urine dipstick: NEGATIVE
Ketones, ur: 5 mg/dL — AB
Leukocytes,Ua: NEGATIVE
Nitrite: NEGATIVE
Protein, ur: >=300 mg/dL — AB
Specific Gravity, Urine: 1.012 (ref 1.005–1.030)
pH: 5 (ref 5.0–8.0)

## 2020-01-06 LAB — COMPREHENSIVE METABOLIC PANEL
ALT: 33 U/L (ref 0–44)
AST: 36 U/L (ref 15–41)
Albumin: 4 g/dL (ref 3.5–5.0)
Alkaline Phosphatase: 146 U/L — ABNORMAL HIGH (ref 38–126)
Anion gap: 9 (ref 5–15)
BUN: 21 mg/dL — ABNORMAL HIGH (ref 6–20)
CO2: 29 mmol/L (ref 22–32)
Calcium: 8.7 mg/dL — ABNORMAL LOW (ref 8.9–10.3)
Chloride: 98 mmol/L (ref 98–111)
Creatinine, Ser: 1.35 mg/dL — ABNORMAL HIGH (ref 0.61–1.24)
GFR calc Af Amer: >60 mL/min (ref >60)
GFR calc non Af Amer: >60 mL/min (ref >60)
Glucose, Bld: 157 mg/dL — ABNORMAL HIGH (ref 70–99)
Potassium: 3.9 mmol/L (ref 3.5–5.1)
Sodium: 136 mmol/L (ref 135–145)
Total Bilirubin: 0.9 mg/dL (ref 0.3–1.2)
Total Protein: 8.7 g/dL — ABNORMAL HIGH (ref 6.5–8.1)

## 2020-01-06 LAB — LIPASE, BLOOD: Lipase: 92 U/L — ABNORMAL HIGH (ref 11–51)

## 2020-01-06 MED ORDER — LACTATED RINGERS IV BOLUS
1000.0000 mL | Freq: Once | INTRAVENOUS | Status: AC
  Administered 2020-01-06: 1000 mL via INTRAVENOUS

## 2020-01-06 MED ORDER — OXYCODONE-ACETAMINOPHEN 5-325 MG PO TABS: 1.0000 | ORAL_TABLET | ORAL | 0 refills | Status: DC | PRN

## 2020-01-06 MED ORDER — ONDANSETRON HCL 4 MG/2ML IJ SOLN
4.0000 mg | Freq: Once | INTRAMUSCULAR | Status: AC
  Administered 2020-01-06: 4 mg via INTRAVENOUS
  Filled 2020-01-06: qty 2

## 2020-01-06 MED ORDER — IOHEXOL 300 MG/ML  SOLN
125.0000 mL | Freq: Once | INTRAMUSCULAR | Status: AC | PRN
  Administered 2020-01-06: 125 mL via INTRAVENOUS

## 2020-01-06 MED ORDER — MORPHINE SULFATE (PF) 4 MG/ML IV SOLN
4.0000 mg | Freq: Once | INTRAVENOUS | Status: AC
  Administered 2020-01-06: 4 mg via INTRAVENOUS
  Filled 2020-01-06: qty 1

## 2020-01-06 MED ORDER — ONDANSETRON 4 MG PO TBDP: 4.0000 mg | ORAL_TABLET | Freq: Three times a day (TID) | ORAL | 0 refills | Status: DC | PRN

## 2020-01-06 NOTE — ED Notes (Signed)
Pt given crackers and juice for PO challenge as ordered by EDP. Pt reports minimal nausea, no vomiting, and abd pain 3/10 after eating.

## 2020-01-06 NOTE — ED Triage Notes (Signed)
Pt here for same pain has been seen twice for. C/o mid to upper abdominal pain worse with eating. No vomiting. Has scan scheduled at [facility] Monday but does not want to leave until has reason for pain because cannot eat. No fever. Ambulatory. NAD at this time. Color WNL

## 2020-01-06 NOTE — ED Notes (Signed)
Pt desat on RA, to 71%. Pt uses 3L O2 at home. Pt placed on 3 L, O2 sat increased to 91%.

## 2020-01-06 NOTE — ED Notes (Addendum)
Pt reports taking ibuprofen for abdominal pain yesterday evening. Pt states he went to sleep but it didn't help so he came to the ED today. Pt stated he was told not to take ibuprofen for his abdominal pain but it was the only thing he had. Pt stated he was "prescribed pain medication but took it all". Pt took PPI, but states no longer takes it because it made his stomach hurt.  Pt visualized comfortable, NAD noted, VSS.

## 2020-01-06 NOTE — ED Provider Notes (Signed)
[facility] Emergency Department Provider Note   ____________________________________________   First MD Initiated Contact with Patient 01/06/20 [phone]     (approximate)  I have reviewed the triage vital signs and the nursing notes.   HISTORY  Chief Complaint Abdominal Pain    HPI Oscar [name] is a 44 y.o. male with past medical history of CAD, diabetes, hypertension, CHF, and chronic pancreatitis presents to the ED complaining of epigastric pain.  Patient reports he has been having multiple weeks of epigastric pain that is intermittent and worse when he eats.  It is been associated with nausea and he states he is having difficulty tolerating any solid foods.  He denies any fevers and has not been vomiting, also denies any diarrhea or blood in his stool.  He was seen in the ED for this problem about 1 week ago, where there was concern for peptic ulcer disease and he was started on viscous lidocaine as well as Carafate.  He states the Carafate seems to make his pain worse and the viscous lidocaine does not help.  He denies any alcohol abuse, but does admit he has been taking NSAIDs occasionally for the pain.  He has an MRI scheduled at [facility] on Monday for further evaluation.        Past Medical History:  Diagnosis Date  . AICD (automatic cardioverter/defibrillator) present   . Asthma   . Cardiomyopathy ([place])   . CHF (congestive heart failure) ([place])   . Coronary artery disease   . Deafness in right ear   . Diabetes mellitus without complication ([place])   . Dilated cardiomyopathy ([place])   . Dysrhythmia    svt  . 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 GERD (gastroesophageal reflux disease)   . Hyperlipidemia   . Hypertension   . Hypoxemia   . Hypoxemia   . Mild obesity   . Myocardial infarction ([place])    [id number],2/95  . Pancreatitis   . Sleep apnea    osa    Patient Active Problem List   Diagnosis Date Noted  . Foreign body granuloma of  skin and subcutaneous tissue 05/26/2019  . Acute pancreatitis 05/19/2017  . Pancreatitis 09/12/2016  . Bronchitis 05/20/2016  . Constipation 11/08/2015  . Abdominal pain 11/02/2015  . Obstructive sleep apnea 07/31/2015  . Tobacco use 07/31/2015  . Diabetes ([place]) 07/10/2015  . Chronic systolic heart failure ([place]) 03/30/2015  . Cardiac pacemaker 03/30/2015  . Chronic respiratory failure with hypercapnia ([place]) 12/12/2014  . Elevated troponin   . Non-ischemic cardiomyopathy ([place])   . Hypoxemia   . SVT (supraventricular tachycardia) ([place]) 12/09/2014  . NSTEMI (non-ST elevated myocardial infarction) ([place]) 12/09/2014  . Hypertension 12/09/2014    Past Surgical History:  Procedure Laterality Date  . CARDIAC CATHETERIZATION  12/11/2014   Procedure: RIGHT/LEFT HEART CATH AND CORONARY ANGIOGRAPHY;  Surgeon: Lorretta Harp, MD;  Location: [facility] CATH LAB;  Service: Cardiovascular;;  . ICD LEAD REMOVAL N/A 03/30/2015   Procedure: ICD LEAD REMOVAL;  Surgeon: Marzetta Board, MD;  Location: [facility] ORS;  Service: Cardiovascular;  Laterality: N/A;  . IMPLANTABLE CARDIOVERTER DEFIBRILLATOR IMPLANT    . INSERT / REPLACE / REMOVE PACEMAKER    . LEFT HEART CATHETERIZATION WITH CORONARY ANGIOGRAM N/A 12/09/2014   Procedure: LEFT HEART CATHETERIZATION WITH CORONARY ANGIOGRAM;  Surgeon: Burnell Blanks, MD;  Location: [facility] CATH LAB;  Service: Cardiovascular;  Laterality: N/A;    Prior to Admission medications   Medication  Sig Start Date End Date Taking? Authorizing Provider  Accu-Chek Softclix Lancets lancets USE TO CHECK BLOOD SUGAR THREE TIMES DAILY 05/20/19   [provider]  albuterol (VENTOLIN HFA) 108 (90 Base) MCG/ACT inhaler 2 inhalations every 6 (six) hours as needed 01/21/19   [provider]  allopurinol (ZYLOPRIM) 100 MG tablet Take by mouth. 11/09/18   [provider]  aluminum-magnesium hydroxide-simethicone (MAALOX) 923-300-76 MG/5ML SUSP Take 30 mLs by mouth 4 (four)  times daily -  before meals and at bedtime. 05/18/18   Carrie Mew, MD  amLODipine (NORVASC) 5 MG tablet Take by mouth.    [provider]  aspirin EC 325 MG tablet Take 1 tablet (325 mg total) by mouth daily. 07/10/15   Aldean Jewett, MD  atorvastatin (LIPITOR) 40 MG tablet Take 1 tablet (40 mg total) by mouth daily at 6 PM. 11/04/18   Alisa Graff, FNP  chlorhexidine (HIBICLENS) 4 % external liquid Use as body was daily in the shower 04/18/19   [provider]  diazepam (VALIUM) 5 MG tablet Take 1 tablet 30 minutes before MRI. If needed, may take 1 tablet at time of MRI under supervision of radiology. 10/14/19   [provider]  doxycycline (VIBRA-TABS) 100 MG tablet Take 1 tablet (100 mg total) by mouth 2 (two) times daily. 05/26/19   [name], DPM  famotidine (PEPCID) 20 MG tablet Take 1 tablet (20 mg total) by mouth 2 (two) times daily. 05/18/18   Carrie Mew, MD  furosemide (LASIX) 20 MG tablet Take by mouth. 09/22/19   [provider]  gabapentin (NEURONTIN) 300 MG capsule Take 300 mg by mouth 2 (two) times daily.    [provider]  insulin detemir (LEVEMIR FLEXTOUCH) 100 UNIT/ML FlexPen Inject into the skin. 11/10/18   [provider]  insulin glargine (LANTUS) 100 UNIT/ML injection Inject into the skin. 12/07/15   [provider]  insulin starter kit- syringes MISC 1 kit by Other route once. 07/10/15   Aldean Jewett, MD  levofloxacin (LEVAQUIN) 750 MG tablet Take by mouth. 09/21/19   [provider]  lidocaine (XYLOCAINE) 2 % solution Use as directed 15 mLs in the mouth or throat every 6 (six) hours as needed (stomach pain). 12/29/19   Nance Pear, MD  metoprolol succinate (TOPROL-XL) 25 MG 24 hr tablet Take by mouth. 10/06/19   [provider]  omeprazole (PRILOSEC) 40 MG capsule Take by mouth. 03/17/19 03/16/20  [provider]  ondansetron (ZOFRAN ODT) 4 MG disintegrating tablet  Take 1 tablet (4 mg total) by mouth every 8 (eight) hours as needed for nausea or vomiting. 01/06/20   Blake Divine, MD  oxyCODONE-acetaminophen (PERCOCET) 5-325 MG tablet Take 1 tablet by mouth every 4 (four) hours as needed for severe pain. 01/06/20 01/05/21  Blake Divine, MD  sacubitril-valsartan (ENTRESTO) 49-51 MG Take by mouth. 06/13/19   [provider]  spironolactone (ALDACTONE) 25 MG tablet Take by mouth. 09/19/16   [provider]  sucralfate (CARAFATE) 1 g tablet Take 1 tablet (1 g total) by mouth 4 (four) times daily. 12/29/19   Nance Pear, MD  Syringe, Disposable, 1 ML MISC 0.1 mLs by Does not apply route as directed. 08/21/15   Alisa Graff, FNP  tiotropium (SPIRIVA) 18 MCG inhalation capsule Place 18 mcg into inhaler and inhale daily.    [provider]  torsemide (DEMADEX) 20 MG tablet Take 1 tablet (20 mg total) by mouth daily. 11/04/18  Alisa Graff, FNP  ULTICARE MINI PEN NEEDLES 31G X 6 MM MISC  11/10/18   [provider]  umeclidinium-vilanterol (ANORO ELLIPTA) 62.5-25 MCG/INH AEPB Inhale into the lungs. 01/31/19   [provider]  [place] 18 MG/3ML SOPN  10/17/19   [provider]    Allergies Bidil [isosorb dinitrate-hydralazine] and Ciprofloxacin  Family History  Problem Relation Age of Onset  . Hypertension Mother   . Congestive Heart Failure Mother   . Hypertension Sister   . Diabetes Sister   . Pancreatitis Sister   . COPD Sister   . Pancreatitis Brother   . Anemia Neg Hx   . Arrhythmia Neg Hx   . Asthma Neg Hx   . Clotting disorder Neg Hx   . Fainting Neg Hx   . Heart attack Neg Hx   . Heart disease Neg Hx   . Heart failure Neg Hx   . Hyperlipidemia Neg Hx     Social History Social History   Tobacco Use  . Smoking status: Current Every Day Smoker    Packs/day: 0.50    Years: 24.00    Pack years: 12.00    Types: Cigarettes  . Smokeless tobacco: Never Used  . Tobacco comment: 4/5  Smoking 5 cigs a day  Substance Use Topics  . Alcohol use: No  . Drug use: No    Review of Systems  Constitutional: No fever/chills Eyes: No visual changes. ENT: No sore throat. Cardiovascular: Denies chest pain. Respiratory: Denies shortness of breath. Gastrointestinal: Positive for abdominal pain.  Positive for nausea, no vomiting.  No diarrhea.  No constipation. Genitourinary: Negative for dysuria. Musculoskeletal: Negative for back pain. Skin: Negative for rash. Neurological: Negative for headaches, focal weakness or numbness.  ____________________________________________   PHYSICAL EXAM:  VITAL SIGNS: ED Triage Vitals  Enc Vitals Group     BP 01/06/20 0922 127/81     Pulse Rate 01/06/20 0922 80     Resp 01/06/20 0922 18     Temp 01/06/20 0922 97.8 F (36.6 C)     Temp Source 01/06/20 0922 Oral     SpO2 01/06/20 0922 94 %     Weight 01/06/20 0912 (!) 308 lb (139.7 kg)     Height 01/06/20 0912 5' 6"  (1.676 m)     Head Circumference --      Peak Flow --      Pain Score 01/06/20 0912 8     Pain Loc --      Pain Edu? --      Excl. in [place]? --     Constitutional: Alert and oriented. Eyes: Conjunctivae are normal. Head: Atraumatic. Nose: No congestion/rhinnorhea. Mouth/Throat: Mucous membranes are moist. Neck: Normal ROM Cardiovascular: Normal rate, regular rhythm. Grossly normal heart sounds. Respiratory: Normal respiratory effort.  No retractions. Lungs CTAB. Gastrointestinal: Soft and tender to palpation in the epigastrium with no rebound or guarding. No distention. Genitourinary: deferred Musculoskeletal: No lower extremity tenderness nor edema. Neurologic:  Normal speech and language. No gross focal neurologic deficits are appreciated. Skin:  Skin is warm, dry and intact. No rash noted. Psychiatric: Mood and affect are normal. Speech and behavior are normal.  ____________________________________________   LABS (all labs ordered are listed, but only  abnormal results are displayed)  Labs Reviewed  LIPASE, BLOOD - Abnormal; Notable for the following components:      Result Value   Lipase 92 (*)    All other components within normal limits  COMPREHENSIVE METABOLIC PANEL -  Abnormal; Notable for the following components:   Glucose, Bld 157 (*)    BUN 21 (*)    Creatinine, Ser 1.35 (*)    Calcium 8.7 (*)    Total Protein 8.7 (*)    Alkaline Phosphatase 146 (*)    All other components within normal limits  CBC - Abnormal; Notable for the following components:   Hemoglobin 17.8 (*)    MCHC 36.9 (*)    All other components within normal limits  URINALYSIS, COMPLETE (UACMP) WITH MICROSCOPIC - Abnormal; Notable for the following components:   Color, Urine YELLOW (*)    APPearance CLEAR (*)    Ketones, ur 5 (*)    Protein, ur >=300 (*)    All other components within normal limits   ____________________________________________  EKG  ED ECG REPORT I, Blake Divine, the attending physician, personally viewed and interpreted this ECG.   Date: 01/06/2020  EKG Time: 9:15  Rate: 82  Rhythm: normal sinus rhythm  Axis: Normal  Intervals:left bundle branch block  ST&T Change: None   PROCEDURES  Procedure(s) performed (including Critical Care):  Procedures   ____________________________________________   INITIAL IMPRESSION / ASSESSMENT AND PLAN / ED COURSE       44 year old male with history of chronic pancreatitis presents to the ED with ongoing epigastric pain along with nausea and difficulty tolerating p.o.  He does have epigastric tenderness on exam along with mildly elevated lipase, remainder of labs are unremarkable.  EKG without evidence of arrhythmia or ischemia and I doubt cardiac etiology for symptoms.  UA shows no evidence of infection.  CT scan was obtained and is negative for acute process, no findings consistent with acute pancreatitis or any potential pancreatic complications.  Patient states he feels much better  following IV morphine and Zofran, he was offered admission for further management of symptoms, but declines.  He states he just wants to be able to make it to his appointment at [facility] on Monday.  His symptoms are likely due to chronic pancreatitis and a short course of pain medication seems reasonable.  He was able to tolerate p.o. here in the ED without difficulty, was counseled to return to the ED for new or worsening symptoms.      ____________________________________________   FINAL CLINICAL IMPRESSION(S) / ED DIAGNOSES  Final diagnoses:  Epigastric pain  PUD (peptic ulcer disease)     ED Discharge Orders         Ordered    oxyCODONE-acetaminophen (PERCOCET) 5-325 MG tablet  Every 4 hours PRN     01/06/20 1308    ondansetron (ZOFRAN ODT) 4 MG disintegrating tablet  Every 8 hours PRN     01/06/20 1308           Note:  This document was prepared using Dragon voice recognition software and may include unintentional dictation errors.   Blake Divine, MD 01/06/20 [phone]

## 2020-01-08 ENCOUNTER — Encounter: Payer: Self-pay | Admitting: Emergency Medicine

## 2020-01-08 ENCOUNTER — Other Ambulatory Visit: Payer: Self-pay

## 2020-01-08 ENCOUNTER — Emergency Department
Admission: EM | Admit: 2020-01-08 | Discharge: 2020-01-08 | Disposition: A | Discharge status: Home / Self Care | Payer: Medicare Other | Attending: Emergency Medicine | Admitting: Emergency Medicine

## 2020-01-08 DIAGNOSIS — Z5321 Procedure and treatment not carried out due to patient leaving prior to being seen by health care provider: Secondary | ICD-10-CM | POA: Insufficient documentation

## 2020-01-08 DIAGNOSIS — R109 Unspecified abdominal pain: Secondary | ICD-10-CM | POA: Diagnosis present

## 2020-01-08 DIAGNOSIS — R0789 Other chest pain: Secondary | ICD-10-CM | POA: Insufficient documentation

## 2020-01-08 DIAGNOSIS — M549 Dorsalgia, unspecified: Secondary | ICD-10-CM | POA: Insufficient documentation

## 2020-01-08 LAB — COMPREHENSIVE METABOLIC PANEL
ALT: 29 U/L (ref 0–44)
AST: 32 U/L (ref 15–41)
Albumin: 3.5 g/dL (ref 3.5–5.0)
Alkaline Phosphatase: 138 U/L — ABNORMAL HIGH (ref 38–126)
Anion gap: 9 (ref 5–15)
BUN: 22 mg/dL — ABNORMAL HIGH (ref 6–20)
CO2: 27 mmol/L (ref 22–32)
Calcium: 8.6 mg/dL — ABNORMAL LOW (ref 8.9–10.3)
Chloride: 98 mmol/L (ref 98–111)
Creatinine, Ser: 1.14 mg/dL (ref 0.61–1.24)
GFR calc Af Amer: >60 mL/min (ref >60)
GFR calc non Af Amer: >60 mL/min (ref >60)
Glucose, Bld: 147 mg/dL — ABNORMAL HIGH (ref 70–99)
Potassium: 3.7 mmol/L (ref 3.5–5.1)
Sodium: 134 mmol/L — ABNORMAL LOW (ref 135–145)
Total Bilirubin: 0.8 mg/dL (ref 0.3–1.2)
Total Protein: 7.5 g/dL (ref 6.5–8.1)

## 2020-01-08 LAB — CBC
HCT: 44 % (ref 39.0–52.0)
Hemoglobin: 15.5 g/dL (ref 13.0–17.0)
MCH: 33 pg (ref 26.0–34.0)
MCHC: 35.2 g/dL (ref 30.0–36.0)
MCV: 93.6 fL (ref 80.0–100.0)
Platelets: 154 10*3/uL (ref 150–400)
RBC: 4.7 MIL/uL (ref 4.22–5.81)
RDW: 11.8 % (ref 11.5–15.5)
WBC: 5.4 10*3/uL (ref 4.0–10.5)
nRBC: 0 % (ref 0.0–0.2)

## 2020-01-08 LAB — TROPONIN I (HIGH SENSITIVITY)
Troponin I (High Sensitivity): 46 ng/L — ABNORMAL HIGH (ref <18)
Troponin I (High Sensitivity): 58 ng/L — ABNORMAL HIGH (ref <18)

## 2020-01-08 LAB — LIPASE, BLOOD: Lipase: 59 U/L — ABNORMAL HIGH (ref 11–51)

## 2020-01-08 MED ORDER — SODIUM CHLORIDE 0.9% FLUSH: 3.0000 mL | Freq: Once | INTRAVENOUS | Status: DC

## 2020-01-08 NOTE — ED Triage Notes (Signed)
Pt to ER states abdominal pain and low back pain that started 2 days ago.  Pt also reports mid sternal chest pain. Pt states back pain is worse when he lies down and that it feels like a spasm.  Pt denies n/v, shob, or other s/s at this time.

## 2020-01-11 NOTE — Progress Notes (Signed)
Patient ID: Oscar [name], male    DOB: [birth year], 44 y.o.   MRN: [id number]  HPI  Oscar [name] is a 44 y/o male with a history of obstructive sleep apnea (currently with CPAP), MI, HTN, hyperlipidemia, GERD, DM, SVT, asthma and chronic heart failure.  Echo done 03/01/2019 which showed an EF of 15% along with mild Oscar. Echo done 06/23/18 reviewed and showed an EF of 20-25% along with mild Oscar. Echo done 02/06/15 which showed an EF of 25% which is unchanged from previous echo on 12/10/14 which showed an EF of 25% and mild Oscar.   Was in the ED 01/08/20 due to abdominal/ flank pain. Given IVF. CXR/ abdominal & pelvic CT obtained. Symptoms slightly improved and patient requested discharge. Was in the ED 01/06/20 abdominal/ epigastric pain. CT was negative for acute process. Given IV medications and symptoms improved and Oscar [name] was released. Was in the ED 12/29/19 due to abdominal pain. Given IV fluids due to renal function. Lipase minimally elevated. Medications adjusted and Oscar [name] was released. Was in the ED 12/21/19 due to abdominal pain and nausea. High sensitivity troponin was elevated but patient said Oscar [name] had to leave due to father's recent death and Oscar [name] opted to leave AMA.     Oscar [name] presents today for a follow-up visit with a chief complaint of minimal shortness of breath upon moderate exertion. Oscar [name] describes this as chronic in nature having been present for several years. Oscar [name] has associated fatigue, cough, intermittent palpitations, abdominal distention (at times), abdominal pain, light-headedness (at times) and now back pain along with this. Oscar [name] denies any difficulty sleeping, pedal edema, chest pain, wheezing or weight gain.   Continues to have quite a bit of abdominal pain which had previously been made worse when eating but Oscar [name] says that Oscar [name] now can eat without producing pain. Oscar [name] recently had a stomach MRI which was negative and has an appointment at [facility] biliary clinic in a couple of weeks.    Past Medical History:   Diagnosis Date  . AICD (automatic cardioverter/defibrillator) present   . Asthma   . Cardiomyopathy ([place])   . CHF (congestive heart failure) ([place])   . Coronary artery disease   . Deafness in right ear   . Diabetes mellitus without complication ([place])   . Dilated cardiomyopathy ([place])   . Dysrhythmia    svt  . 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 GERD (gastroesophageal reflux disease)   . Hyperlipidemia   . Hypertension   . Hypoxemia   . Hypoxemia   . Mild obesity   . Myocardial infarction ([place])    8295,[id number],0/86  . Pancreatitis   . Sleep apnea    osa   Past Surgical History:  Procedure Laterality Date  . CARDIAC CATHETERIZATION  12/11/2014   Procedure: RIGHT/LEFT HEART CATH AND CORONARY ANGIOGRAPHY;  Surgeon: Lorretta Harp, MD;  Location: [facility] CATH LAB;  Service: Cardiovascular;;  . ICD LEAD REMOVAL N/A 03/30/2015   Procedure: ICD LEAD REMOVAL;  Surgeon: Marzetta Board, MD;  Location: [facility] ORS;  Service: Cardiovascular;  Laterality: N/A;  . IMPLANTABLE CARDIOVERTER DEFIBRILLATOR IMPLANT    . INSERT / REPLACE / REMOVE PACEMAKER    . LEFT HEART CATHETERIZATION WITH CORONARY ANGIOGRAM N/A 12/09/2014   Procedure: LEFT HEART CATHETERIZATION WITH CORONARY ANGIOGRAM;  Surgeon: Burnell Blanks, MD;  Location: [facility] CATH LAB;  Service: Cardiovascular;  Laterality: N/A;   Family History  Problem Relation Age of Onset  . Hypertension Mother   .  Congestive Heart Failure Mother   . Hypertension Sister   . Diabetes Sister   . Pancreatitis Sister   . COPD Sister   . Pancreatitis Brother   . Anemia Neg Hx   . Arrhythmia Neg Hx   . Asthma Neg Hx   . Clotting disorder Neg Hx   . Fainting Neg Hx   . Heart attack Neg Hx   . Heart disease Neg Hx   . Heart failure Neg Hx   . Hyperlipidemia Neg Hx    Social History   Tobacco Use  . Smoking status: Current Every Day Smoker    Packs/day: 0.50    Years: 24.00    Pack years: 12.00    Types: Cigarettes  . Smokeless  tobacco: Never Used  . Tobacco comment: 4/5 Smoking 5 cigs a day  Substance Use Topics  . Alcohol use: No   Allergies  Allergen Reactions  . Bidil [Isosorb Dinitrate-Hydralazine] Other (See Comments)    Migraine Headache Migraine Headache  . Ciprofloxacin    Prior to Admission medications   Medication Sig Start Date End Date Taking? Authorizing Provider  Accu-Chek Softclix Lancets lancets USE TO CHECK BLOOD SUGAR THREE TIMES DAILY 05/20/19  Yes [provider]  albuterol (VENTOLIN HFA) 108 (90 Base) MCG/ACT inhaler 2 inhalations every 6 (six) hours as needed 01/21/19  Yes [provider]  allopurinol (ZYLOPRIM) 100 MG tablet Take by mouth. 11/09/18  Yes [provider]  aluminum-magnesium hydroxide-simethicone (MAALOX) 480-165-53 MG/5ML SUSP Take 30 mLs by mouth 4 (four) times daily -  before meals and at bedtime. 05/18/18  Yes Carrie Mew, MD  amLODipine (NORVASC) 5 MG tablet Take by mouth.   Yes [provider]  aspirin EC 325 MG tablet Take 1 tablet (325 mg total) by mouth daily. 07/10/15  Yes Aldean Jewett, MD  atorvastatin (LIPITOR) 40 MG tablet Take 1 tablet (40 mg total) by mouth daily at 6 PM. 11/04/18  Yes Darylene [place] A, FNP  chlorhexidine (HIBICLENS) 4 % external liquid Use as body was daily in the shower 04/18/19  Yes [provider]  famotidine (PEPCID) 20 MG tablet Take 1 tablet (20 mg total) by mouth 2 (two) times daily. 05/18/18  Yes Carrie Mew, MD  gabapentin (NEURONTIN) 300 MG capsule Take 300 mg by mouth 2 (two) times daily.   Yes [provider]  insulin detemir (LEVEMIR FLEXTOUCH) 100 UNIT/ML FlexPen Inject into the skin. 11/10/18  Yes [provider]  insulin glargine (LANTUS) 100 UNIT/ML injection Inject into the skin. 12/07/15  Yes [provider]  insulin starter kit- syringes MISC 1 kit by Other route once. 07/10/15  Yes Aldean Jewett, MD  lidocaine (XYLOCAINE) 2 % solution Use as  directed 15 mLs in the mouth or throat every 6 (six) hours as needed (stomach pain). 12/29/19  Yes Nance Pear, MD  metoprolol succinate (TOPROL-XL) 25 MG 24 hr tablet Take by mouth. 10/06/19  Yes [provider]  omeprazole (PRILOSEC) 40 MG capsule Take by mouth. 03/17/19 03/16/20 Yes [provider]  ondansetron (ZOFRAN ODT) 4 MG disintegrating tablet Take 1 tablet (4 mg total) by mouth every 8 (eight) hours as needed for nausea or vomiting. 01/06/20  Yes Blake Divine, MD  sacubitril-valsartan [facility]) 49-51 MG Take by mouth. 06/13/19  Yes [provider]  sucralfate (CARAFATE) 1 g tablet Take 1 tablet (1 g total) by mouth 4 (four) times daily. 12/29/19  Yes Nance [place], MD  Syringe, Disposable, 1 ML  MISC 0.1 mLs by Does not apply route as directed. 08/21/15  Yes Darylene [place] A, FNP  tiotropium (SPIRIVA) 18 MCG inhalation capsule Place 18 mcg into inhaler and inhale daily.   Yes [provider]  torsemide (DEMADEX) 20 MG tablet Take 1 tablet (20 mg total) by mouth daily. Patient taking differently: Take 40 mg by mouth daily.  11/04/18  Yes Gwenyth Dingee, Aura Fey, FNP  ULTICARE MINI PEN NEEDLES 31G X 6 MM MISC  11/10/18  Yes [provider]  umeclidinium-vilanterol (ANORO ELLIPTA) 62.5-25 MCG/INH AEPB Inhale into the lungs. 01/31/19  Yes [provider]  [place] 18 MG/3ML SOPN  10/17/19  Yes [provider]  oxyCODONE-acetaminophen (PERCOCET) 5-325 MG tablet Take 1 tablet by mouth every 4 (four) hours as needed for severe pain. Patient not taking: Reported on 01/12/2020 01/06/20 01/05/21  Blake Divine, MD  spironolactone (ALDACTONE) 25 MG tablet Take by mouth. 09/19/16   [provider]    Review of Systems  Constitutional: Positive for fatigue. Negative for appetite change.  HENT: Negative for congestion, postnasal drip and sore throat.   Eyes: Negative.   Respiratory: Positive for cough and shortness of breath. Negative for  chest tightness and wheezing.   Cardiovascular: Positive for palpitations. Negative for chest pain and leg swelling.  Gastrointestinal: Positive for abdominal distention (at times) and abdominal pain (at times). Negative for diarrhea and nausea.  Endocrine: Negative.   Genitourinary: Negative.   Musculoskeletal: Positive for back pain. Negative for arthralgias.  Skin: Negative.   Allergic/Immunologic: Negative.   Neurological: Positive for light-headedness (at times). Negative for dizziness and headaches.  Hematological: Negative for adenopathy. Does not bruise/bleed easily.  Psychiatric/Behavioral: Negative for dysphoric mood, sleep disturbance (sleeping on 4 pillows) and suicidal ideas. The patient is not nervous/anxious.    Vitals:   01/12/20 1008  BP: 94/68  Pulse: 88  Resp: 18  SpO2: 92%  Weight: (!) 306 lb 8 oz (139 kg)  Height: 5' 6"  (1.676 m)   Wt Readings from Last 3 Encounters:  01/12/20 (!) 306 lb 8 oz (139 kg)  01/08/20 (!) 307 lb 15.7 oz (139.7 kg)  01/06/20 (!) 308 lb (139.7 kg)   Lab Results  Component Value Date   CREATININE 1.14 01/08/2020   CREATININE 1.35 (H) 01/06/2020   CREATININE 2.09 (H) 12/29/2019     Physical Exam  Constitutional: Oscar [name] is oriented to person, place, and time. Oscar [name] appears well-developed and well-nourished.  HENT:  Head: Normocephalic and atraumatic.  Neck: No JVD present.  Cardiovascular: Normal rate and regular rhythm.  Pulmonary/Chest: Effort normal. Oscar [name] has no wheezes. Oscar [name] has no rales.  Abdominal: Soft. Oscar [name] exhibits no distension. There is no abdominal tenderness.  Musculoskeletal:        General: No tenderness or edema.     Cervical back: Normal range of motion and neck supple.  Neurological: Oscar [name] is alert and oriented to person, place, and time.  Skin: Skin is warm and dry.  Psychiatric: Oscar [name] has a normal mood and affect. His behavior is normal. Thought content normal.  Nursing note and vitals reviewed.    Assessment & Plan:  1:  Chronic heart failure with reduced ejection fraction- - NYHA class II - euvolemic today - weighing daily; Reminded to call for an overnight weight gain of >2 pounds or a weekly weight gain of >5 pounds.  - weight down 13 pounds from last visit here 4 months ago; Oscar [name] says that Oscar [name]'s only been able to recently start eating  without reproducing abdominal pain - BP limits titration of medications - last saw cardiology Dema Severin) 12/16/19 - bidil gave him migraine headache - BNP on 12/16/19 was 189.0 - insulin dependent so may not be able to use farxiga - AICD in place - has received his first COVID vaccine  2: HTN- - BP a little on the low side today; Oscar [name]'s to continue checking it at home and let us know if his SBP is <90 - sees PCP [name]) at [place] from 01/08/20 reviewed and shows sodium 134, potassium 3.7, creatinine 1.14 & GFR >60  3: Obstructive sleep apnea- - not wearing CPAP anymore due to comfort level  - continues wearing oxygen at 3L at bedtime    4: Diabetes- - fasting glucose at home was 147 - A1c from 09/12/16 was 10.9%  5: Tobacco use- - continues to smoke 1/2 ppd cigarettes daily - does remove himself from the oxygen when smoking - complete cessation discussed for 3 minutes with him  6: Pancreatitis- - recent stomach MRI was negative. - has appointment at [facility] biliary clinic in a couple of weeks   Patient did not bring his medications nor a list. Emphasized that Oscar [name] needed to bring his medication bottles to every visit with him.   Return in 3 months or sooner for any questions/problems before then.

## 2020-01-12 ENCOUNTER — Encounter: Payer: Self-pay | Admitting: Family

## 2020-01-12 ENCOUNTER — Ambulatory Visit: Payer: Medicare Other | Attending: Family | Admitting: Family

## 2020-01-12 ENCOUNTER — Other Ambulatory Visit: Payer: Self-pay

## 2020-01-12 VITALS — BP 94/68 | HR 88 | Resp 18 | Ht 66.0 in | Wt 306.5 lb

## 2020-01-12 DIAGNOSIS — Z95810 Presence of automatic (implantable) cardiac defibrillator: Secondary | ICD-10-CM | POA: Diagnosis not present

## 2020-01-12 DIAGNOSIS — Z794 Long term (current) use of insulin: Secondary | ICD-10-CM | POA: Insufficient documentation

## 2020-01-12 DIAGNOSIS — I5022 Chronic systolic (congestive) heart failure: Secondary | ICD-10-CM | POA: Insufficient documentation

## 2020-01-12 DIAGNOSIS — E785 Hyperlipidemia, unspecified: Secondary | ICD-10-CM | POA: Diagnosis not present

## 2020-01-12 DIAGNOSIS — I252 Old myocardial infarction: Secondary | ICD-10-CM | POA: Insufficient documentation

## 2020-01-12 DIAGNOSIS — I2510 Atherosclerotic heart disease of native coronary artery without angina pectoris: Secondary | ICD-10-CM | POA: Insufficient documentation

## 2020-01-12 DIAGNOSIS — G4733 Obstructive sleep apnea (adult) (pediatric): Secondary | ICD-10-CM

## 2020-01-12 DIAGNOSIS — K859 Acute pancreatitis without necrosis or infection, unspecified: Secondary | ICD-10-CM | POA: Insufficient documentation

## 2020-01-12 DIAGNOSIS — I471 Supraventricular tachycardia: Secondary | ICD-10-CM | POA: Diagnosis not present

## 2020-01-12 DIAGNOSIS — Z7982 Long term (current) use of aspirin: Secondary | ICD-10-CM | POA: Insufficient documentation

## 2020-01-12 DIAGNOSIS — Z8349 Family history of other endocrine, nutritional and metabolic diseases: Secondary | ICD-10-CM | POA: Diagnosis not present

## 2020-01-12 DIAGNOSIS — I420 Dilated cardiomyopathy: Secondary | ICD-10-CM | POA: Diagnosis not present

## 2020-01-12 DIAGNOSIS — J45909 Unspecified asthma, uncomplicated: Secondary | ICD-10-CM | POA: Insufficient documentation

## 2020-01-12 DIAGNOSIS — I110 Hypertensive heart disease with heart failure: Secondary | ICD-10-CM | POA: Diagnosis not present

## 2020-01-12 DIAGNOSIS — Z8249 Family history of ischemic heart disease and other diseases of the circulatory system: Secondary | ICD-10-CM | POA: Insufficient documentation

## 2020-01-12 DIAGNOSIS — K219 Gastro-esophageal reflux disease without esophagitis: Secondary | ICD-10-CM | POA: Insufficient documentation

## 2020-01-12 DIAGNOSIS — Z833 Family history of diabetes mellitus: Secondary | ICD-10-CM | POA: Insufficient documentation

## 2020-01-12 DIAGNOSIS — F17210 Nicotine dependence, cigarettes, uncomplicated: Secondary | ICD-10-CM | POA: Diagnosis not present

## 2020-01-12 DIAGNOSIS — E119 Type 2 diabetes mellitus without complications: Secondary | ICD-10-CM | POA: Insufficient documentation

## 2020-01-12 DIAGNOSIS — Z79899 Other long term (current) drug therapy: Secondary | ICD-10-CM | POA: Diagnosis not present

## 2020-01-12 DIAGNOSIS — Z720 Tobacco use: Secondary | ICD-10-CM

## 2020-01-12 DIAGNOSIS — K861 Other chronic pancreatitis: Secondary | ICD-10-CM

## 2020-01-12 DIAGNOSIS — I10 Essential (primary) hypertension: Secondary | ICD-10-CM

## 2020-01-12 NOTE — Patient Instructions (Addendum)
Continue weighing daily and call for an overnight weight gain of > 2 pounds or a weekly weight gain of >5 pounds.   Check your blood pressure at home and call if the top number is less than 90.

## 2020-01-16 ENCOUNTER — Telehealth: Payer: Self-pay | Admitting: *Deleted

## 2020-01-16 ENCOUNTER — Ambulatory Visit: Payer: Medicare Other | Admitting: Podiatry

## 2020-01-16 NOTE — Telephone Encounter (Signed)
"  I have an appointment today at 2:00.  I need to reschedule my appointment.  If someone would give me a call, I'd appreciate it."

## 2020-01-16 NOTE — Telephone Encounter (Signed)
Cozell called and rescheduled the patient to February 02, 2020 at 11:15 am.

## 2020-01-24 ENCOUNTER — Telehealth: Payer: Self-pay

## 2020-01-24 NOTE — Telephone Encounter (Signed)
Patient called stated that he is having a gout flare up since 4 days ago.  He states "its so bad I can't walk on it now."  He has only been taking Ibuprofen, aspirin and Allopurinol with no relief.  He would like something called in.   Is it ok to send in a dose pack to help.  He said he took that before and it helped.  Please advise

## 2020-01-26 ENCOUNTER — Other Ambulatory Visit: Payer: Self-pay | Admitting: Podiatry

## 2020-01-26 ENCOUNTER — Telehealth: Payer: Self-pay

## 2020-01-26 ENCOUNTER — Encounter: Payer: Self-pay | Admitting: Podiatry

## 2020-01-26 ENCOUNTER — Ambulatory Visit (INDEPENDENT_AMBULATORY_CARE_PROVIDER_SITE_OTHER): Payer: Medicare Other | Admitting: Podiatry

## 2020-01-26 ENCOUNTER — Other Ambulatory Visit: Payer: Self-pay

## 2020-01-26 ENCOUNTER — Ambulatory Visit (INDEPENDENT_AMBULATORY_CARE_PROVIDER_SITE_OTHER): Payer: Medicare Other

## 2020-01-26 DIAGNOSIS — S93601A Unspecified sprain of right foot, initial encounter: Secondary | ICD-10-CM | POA: Diagnosis not present

## 2020-01-26 DIAGNOSIS — M10071 Idiopathic gout, right ankle and foot: Secondary | ICD-10-CM

## 2020-01-26 DIAGNOSIS — S93609A Unspecified sprain of unspecified foot, initial encounter: Secondary | ICD-10-CM | POA: Insufficient documentation

## 2020-01-26 MED ORDER — MELOXICAM 15 MG PO TABS
15.0000 mg | ORAL_TABLET | Freq: Every day | ORAL | 0 refills | Status: DC
Start: 2020-01-26 — End: 2020-03-31

## 2020-01-26 NOTE — Telephone Encounter (Signed)
No precert required per [employer]. Patient has been notified and will call to schedule appt to his convenience

## 2020-01-26 NOTE — Progress Notes (Addendum)
This patient presents to the office with chief complaint of pain in his right foot making it unbearable to bear weight and walk.  He says that his foot is throbbing and painful and he has difficulty sleeping due to the pain and throbbing.  He says he started with pain in his big toe joint which he thought was gout.  He has been experiencing pain for 5-6 days.  He presents to the office with male caregiver.  Since the initial pain, the  pain has moved and is present on the top of his right midfoot.   Patient points to the most painful area as the Lisfranc's joint right foot.  He also has developed pain on the bottom of the outside of his right foot. I did not prescribe  him  cortisone for his gout attack since he is diabetic.  I asked him to proceed to the office for proper evaluation and treatment of his painful right foot.  This patient denies any history of trauma or injury to the right foot that he is aware of.    General Appearance  Alert, conversant and in no acute stress.  Vascular  Dorsalis pedis and posterior tibial  pulses are palpable  bilaterally.  Capillary return is within normal limits  bilaterally. Temperature is within normal limits  bilaterally.  Neurologic  Senn-Weinstein monofilament wire test within normal limits  bilaterally. Muscle power within normal limits bilaterally.  Nails Thick disfigured discolored nails with subungual debris  from hallux to fifth toes bilaterally. No evidence of bacterial infection or drainage bilaterally.  Orthopedic  No limitations of motion  feet .  No crepitus or effusions noted.  No bony pathology or digital deformities noted.  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  Examination of his right foot does reveal increased swelling noted extending from the Lisfranc's over the metatarsals of his right foot.  There is no noticeable increased temperature or inflammation at the site.  Patient does have palpable pain noted at the second third and fourth metatarsal cuneiform  joints right foot.  Pain appears to be out of proportion to what is seen clinically and on x-ray.  Skin  normotropic skin with no porokeratosis noted bilaterally.  No signs of infections or ulcers noted.    Foot Sprain at [place] right foot.    ROV.  Xrays were taken and no evidence of any bony pathology is noted at the Lisfranc's area and the fifth metatarsal head.  Patient does have mild HAV deformity first MPJ right.  Patient presents to the office today 5 to 6 days after his initial pain.  If this patient had a gout attack the redness and pain has significantly resolved.  He still has pain out of proportion at the Lisfranc's joint.  I checked his L OPS and that was found to be within normal limits.  I am considering an early development of Charcot foot since patient is diabetic.  His clinical findings are hard to decipher.  Therefore patient was treated with injection therapy right foot.  Patient was injected with 5 cc of a mixture of Marcaine and lidocaine.  Patient was also injected with a mixture of 1 cc of lidocaine plus Kenalog 40.  Patient was prescribed Mobic to be taken by mouth daily.  I also ordered an MRI of his right foot to rule out pathology to his right foot.  Finally he should return to the office to  follow-up with Dr. Posey Pronto for continued evaluation and treatment. RTC  appointment with Dr.  Posey Pronto.   [name] [facility]  01/27/20  Called patient a home at 5  Pm .  He says he is much better and the pain level is now 2 out of 10.  He was also told not to take Mobic until the results of the colonoscopy is received.  Patient has not scheduled his  MRI.  Overall his foot is better.   [name] DPM

## 2020-01-27 ENCOUNTER — Telehealth: Payer: Self-pay

## 2020-01-27 NOTE — Telephone Encounter (Signed)
Patient called stated that he is concerned about taking the Meloxicam because it can cause stomach ulcers.  He said he is seeing a GI provider now for stomach issues.  He would like something else called in.  Please advise

## 2020-01-27 NOTE — Telephone Encounter (Signed)
I spoke with patient, he said his foot is still sore but some better.  I informed him of Dr. Neomia Dear recommendation and see Dr. Posey Pronto after his MRI.   He verbalized understanding and instructions

## 2020-01-27 NOTE — Telephone Encounter (Signed)
Ask him how his foot is doing.  If better do not take the Mobic.  If no better he needs to be non-weightbearing and see Dr.  Posey Pronto.

## 2020-01-31 ENCOUNTER — Institutional Professional Consult (permissible substitution): Payer: Medicare Other | Admitting: Pulmonary Disease

## 2020-02-02 ENCOUNTER — Ambulatory Visit (INDEPENDENT_AMBULATORY_CARE_PROVIDER_SITE_OTHER): Payer: Medicare Other | Admitting: Podiatry

## 2020-02-02 ENCOUNTER — Encounter: Payer: Self-pay | Admitting: Podiatry

## 2020-02-02 ENCOUNTER — Other Ambulatory Visit: Payer: Self-pay

## 2020-02-02 VITALS — Temp 98.5°F

## 2020-02-02 DIAGNOSIS — S93601A Unspecified sprain of right foot, initial encounter: Secondary | ICD-10-CM

## 2020-02-02 DIAGNOSIS — B351 Tinea unguium: Secondary | ICD-10-CM | POA: Diagnosis not present

## 2020-02-02 DIAGNOSIS — M79676 Pain in unspecified toe(s): Secondary | ICD-10-CM

## 2020-02-02 DIAGNOSIS — Z794 Long term (current) use of insulin: Secondary | ICD-10-CM | POA: Diagnosis not present

## 2020-02-02 DIAGNOSIS — E119 Type 2 diabetes mellitus without complications: Secondary | ICD-10-CM | POA: Diagnosis not present

## 2020-02-02 NOTE — Progress Notes (Signed)
This patient returns to my office for at risk foot care.  This patient requires this care by a professional since this patient will be at risk due to having diabetes.  He says his right foot pain is 1 out of 10 following injection therapy last week. This patient is unable to cut nails himself since the patient cannot reach his nails.These nails are painful walking and wearing shoes.  This patient presents for at risk foot care today.  General Appearance  Alert, conversant and in no acute stress.  Vascular  Dorsalis pedis and posterior tibial  pulses are palpable  bilaterally.  Capillary return is within normal limits  bilaterally. Temperature is within normal limits  bilaterally.  Neurologic  Senn-Weinstein monofilament wire test within normal limits  bilaterally. Muscle power within normal limits bilaterally.  Nails Thick disfigured discolored nails with subungual debris  from hallux to fifth toes bilaterally. No evidence of bacterial infection or drainage bilaterally.  Orthopedic  No limitations of motion  feet .  No crepitus or effusions noted.  No bony pathology or digital deformities noted.  No swelling over liz-frank joint right foot.  Skin  normotropic skin with no porokeratosis noted bilaterally.  No signs of infections or ulcers noted.     Onychomycosis  Pain in right toes  Pain in left toes  Foot Sprain right foot.  Consent was obtained for treatment procedures.   Mechanical debridement of nails 1-5  bilaterally performed with a nail nipper.  Filed with dremel without incident. Discussed right foot pain with patient.   Return office visit  3 months                    Told patient to return for periodic foot care and evaluation due to potential at risk complications.   [name] DPM

## 2020-02-14 ENCOUNTER — Other Ambulatory Visit: Payer: Self-pay

## 2020-02-14 ENCOUNTER — Ambulatory Visit (INDEPENDENT_AMBULATORY_CARE_PROVIDER_SITE_OTHER): Payer: Medicare Other | Admitting: Podiatry

## 2020-02-14 DIAGNOSIS — E119 Type 2 diabetes mellitus without complications: Secondary | ICD-10-CM

## 2020-02-14 DIAGNOSIS — Z794 Long term (current) use of insulin: Secondary | ICD-10-CM

## 2020-02-14 DIAGNOSIS — M7751 Other enthesopathy of right foot: Secondary | ICD-10-CM | POA: Diagnosis not present

## 2020-02-16 ENCOUNTER — Encounter: Payer: Self-pay | Admitting: Podiatry

## 2020-02-16 NOTE — Progress Notes (Signed)
Subjective:  Patient ID: Oscar [name], male    DOB: 1976/05/25,  MRN: [id number]  Chief Complaint  Patient presents with  . Gout    pt is here for a f/u of gout to the right foot, pt states that he is feeling a lot better, pain is minimal, pt also states that he is concerned that he might have a black spot on the right big toe.    44 y.o. male presents with the above complaint.  Patient presents with a follow-up after having an acute gout flare of the right first metatarsophalangeal joint.  Patient states that he is feeling much better.  The injection the Dr. Prudence Davidson did couple of weeks ago had dramatically helped.  He following up with me for further evaluation and management.  Patient states that given that his pain is better he does not need another injection.  He denies any other acute complaints.  He is managing fine with diet control.  I discussed with him the importance of diet especially in setting of gouty attack.  Patient states understanding   Review of Systems: Negative except as noted in the HPI. Denies N/V/F/Ch.  Past Medical History:  Diagnosis Date  . AICD (automatic cardioverter/defibrillator) present   . Asthma   . Cardiomyopathy ([place])   . CHF (congestive heart failure) ([place])   . Coronary artery disease   . Deafness in right ear   . Diabetes mellitus without complication ([place])   . Dilated cardiomyopathy ([place])   . Dysrhythmia    svt  . 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 GERD (gastroesophageal reflux disease)   . Hyperlipidemia   . Hypertension   . Hypoxemia   . Hypoxemia   . Mild obesity   . Myocardial infarction ([place])    [id number]/93  . Pancreatitis   . Sleep apnea    osa    Current Outpatient Medications:  .  Accu-Chek Softclix Lancets lancets, USE TO CHECK BLOOD SUGAR THREE TIMES DAILY, Disp: , Rfl:  .  albuterol (VENTOLIN HFA) 108 (90 Base) MCG/ACT inhaler, 2 inhalations every 6 (six) hours as needed, Disp: , Rfl:  .  allopurinol  (ZYLOPRIM) 100 MG tablet, Take by mouth., Disp: , Rfl:  .  aluminum-magnesium hydroxide-simethicone (MAALOX) 810-175-10 MG/5ML SUSP, Take 30 mLs by mouth 4 (four) times daily -  before meals and at bedtime., Disp: 355 mL, Rfl: 0 .  amLODipine (NORVASC) 5 MG tablet, Take by mouth., Disp: , Rfl:  .  aspirin EC 325 MG tablet, Take 1 tablet (325 mg total) by mouth daily., Disp: 30 tablet, Rfl: 0 .  atorvastatin (LIPITOR) 40 MG tablet, Take 1 tablet (40 mg total) by mouth daily at 6 PM., Disp: 90 tablet, Rfl: 3 .  chlorhexidine (HIBICLENS) 4 % external liquid, Use as body was daily in the shower, Disp: , Rfl:  .  famotidine (PEPCID) 20 MG tablet, Take 1 tablet (20 mg total) by mouth 2 (two) times daily., Disp: 60 tablet, Rfl: 0 .  gabapentin (NEURONTIN) 300 MG capsule, Take 300 mg by mouth 2 (two) times daily., Disp: , Rfl:  .  insulin detemir (LEVEMIR FLEXTOUCH) 100 UNIT/ML FlexPen, Inject into the skin., Disp: , Rfl:  .  insulin glargine (LANTUS) 100 UNIT/ML injection, Inject into the skin., Disp: , Rfl:  .  insulin starter kit- syringes MISC, 1 kit by Other route once., Disp: 1 kit, Rfl: 0 .  lidocaine (XYLOCAINE) 2 % solution, Use as directed 15  mLs in the mouth or throat every 6 (six) hours as needed (stomach pain)., Disp: 100 mL, Rfl: 0 .  meloxicam (MOBIC) 15 MG tablet, Take 1 tablet (15 mg total) by mouth daily., Disp: 30 tablet, Rfl: 0 .  metoprolol succinate (TOPROL-XL) 25 MG 24 hr tablet, Take by mouth., Disp: , Rfl:  .  minocycline (MINOCIN) 100 MG capsule, Take by mouth daily., Disp: , Rfl:  .  omeprazole (PRILOSEC) 40 MG capsule, Take by mouth., Disp: , Rfl:  .  ondansetron (ZOFRAN ODT) 4 MG disintegrating tablet, Take 1 tablet (4 mg total) by mouth every 8 (eight) hours as needed for nausea or vomiting., Disp: 12 tablet, Rfl: 0 .  oxyCODONE-acetaminophen (PERCOCET) 5-325 MG tablet, Take 1 tablet by mouth every 4 (four) hours as needed for severe pain., Disp: 15 tablet, Rfl: 0 .   sacubitril-valsartan (ENTRESTO) 49-51 MG, Take by mouth., Disp: , Rfl:  .  spironolactone (ALDACTONE) 25 MG tablet, Take by mouth., Disp: , Rfl:  .  sucralfate (CARAFATE) 1 g tablet, Take 1 tablet (1 g total) by mouth 4 (four) times daily., Disp: 60 tablet, Rfl: 0 .  Syringe, Disposable, 1 ML MISC, 0.1 mLs by Does not apply route as directed., Disp: 60 each, Rfl: 11 .  tiotropium (SPIRIVA) 18 MCG inhalation capsule, Place 18 mcg into inhaler and inhale daily., Disp: , Rfl:  .  torsemide (DEMADEX) 20 MG tablet, Take 1 tablet (20 mg total) by mouth daily. (Patient taking differently: Take 40 mg by mouth daily. ), Disp: 90 tablet, Rfl: 3 .  ULTICARE MINI PEN NEEDLES 31G X 6 MM MISC, , Disp: , Rfl:  .  umeclidinium-vilanterol (ANORO ELLIPTA) 62.5-25 MCG/INH AEPB, Inhale into the lungs., Disp: , Rfl:  .  VICTOZA 18 MG/3ML SOPN, , Disp: , Rfl:   Social History   Tobacco Use  Smoking Status Current Every Day Smoker  . Packs/day: 0.50  . Years: 24.00  . Pack years: 12.00  . Types: Cigarettes  Smokeless Tobacco Never Used  Tobacco Comment   4/5 Smoking 5 cigs a day    Allergies  Allergen Reactions  . Bidil [Isosorb Dinitrate-Hydralazine] Other (See Comments)    Migraine Headache Migraine Headache  . Ciprofloxacin    Objective:  There were no vitals filed for this visit. There is no height or weight on file to calculate BMI. Constitutional Well developed. Well nourished.  Vascular Dorsalis pedis pulses palpable bilaterally. Posterior tibial pulses palpable bilaterally. Capillary refill normal to all digits.  No cyanosis or clubbing noted. Pedal hair growth normal.  Neurologic Normal speech. Oriented to person, place, and time. Epicritic sensation to light touch grossly present bilaterally.  Dermatologic Nails well groomed and normal in appearance. No open wounds. No skin lesions.  Orthopedic:  No pain on palpation to the first metatarsophalangeal joint right side.  No  intra-articular pain with range of motion of the first metatarsophalangeal joint active and passive.  No extensor or flexor range of motion as well.   Radiographs: None Assessment:   1. Type 2 diabetes mellitus without complication, with long-term current use of insulin (HCC)   2. Capsulitis of metatarsophalangeal (MTP) joint of right foot    Plan:  Patient was evaluated and treated and all questions answered.  Right first metatarsophalangeal joint gout flare -It appears that clinically the flare has resolved.  I discussed with the patient the importance of diet control and diet management in an effort to control the gout flareup.  Given that  patient has had had less than 2 in the year at this time patient does not need to follow-up rheumatology.  However I did explain to the patient that he will benefit from a rheumatology follow-up if he continues to have acute flareup more frequently.  He will need to be on a maintenance allopurinol if he does. -Patient can continue taking colchicine as needed during a gout flareup.  He will follow up with Dr. Prudence Davidson for routine foot care.  Return in about 3 months (around 05/16/2020) for f/u with Dr. Prudence Davidson rfc.

## 2020-02-27 ENCOUNTER — Emergency Department: Payer: Medicare Other

## 2020-02-27 ENCOUNTER — Encounter: Payer: Self-pay | Admitting: Emergency Medicine

## 2020-02-27 ENCOUNTER — Other Ambulatory Visit: Payer: Self-pay

## 2020-02-27 ENCOUNTER — Observation Stay
Admit: 2020-02-27 | Discharge: 2020-02-27 | Disposition: A | Discharge status: Home / Self Care | Payer: Medicare Other | Attending: Internal Medicine | Admitting: Internal Medicine

## 2020-02-27 ENCOUNTER — Observation Stay
Admission: EM | Admit: 2020-02-27 | Discharge: 2020-02-28 | Discharge status: Against Medical Advice | Payer: Medicare Other | Attending: Internal Medicine | Admitting: Internal Medicine

## 2020-02-27 DIAGNOSIS — E785 Hyperlipidemia, unspecified: Secondary | ICD-10-CM | POA: Insufficient documentation

## 2020-02-27 DIAGNOSIS — J449 Chronic obstructive pulmonary disease, unspecified: Secondary | ICD-10-CM | POA: Insufficient documentation

## 2020-02-27 DIAGNOSIS — R079 Chest pain, unspecified: Secondary | ICD-10-CM | POA: Diagnosis present

## 2020-02-27 DIAGNOSIS — I5043 Acute on chronic combined systolic (congestive) and diastolic (congestive) heart failure: Secondary | ICD-10-CM | POA: Insufficient documentation

## 2020-02-27 DIAGNOSIS — I252 Old myocardial infarction: Secondary | ICD-10-CM | POA: Diagnosis not present

## 2020-02-27 DIAGNOSIS — Z20822 Contact with and (suspected) exposure to covid-19: Secondary | ICD-10-CM | POA: Insufficient documentation

## 2020-02-27 DIAGNOSIS — I428 Other cardiomyopathies: Secondary | ICD-10-CM

## 2020-02-27 DIAGNOSIS — Z6841 Body Mass Index (BMI) 40.0 and over, adult: Secondary | ICD-10-CM | POA: Diagnosis not present

## 2020-02-27 DIAGNOSIS — E669 Obesity, unspecified: Secondary | ICD-10-CM | POA: Insufficient documentation

## 2020-02-27 DIAGNOSIS — Z95810 Presence of automatic (implantable) cardiac defibrillator: Secondary | ICD-10-CM | POA: Insufficient documentation

## 2020-02-27 DIAGNOSIS — G4730 Sleep apnea, unspecified: Secondary | ICD-10-CM | POA: Insufficient documentation

## 2020-02-27 DIAGNOSIS — I2510 Atherosclerotic heart disease of native coronary artery without angina pectoris: Secondary | ICD-10-CM | POA: Diagnosis not present

## 2020-02-27 DIAGNOSIS — Z794 Long term (current) use of insulin: Secondary | ICD-10-CM | POA: Diagnosis not present

## 2020-02-27 DIAGNOSIS — I509 Heart failure, unspecified: Secondary | ICD-10-CM | POA: Diagnosis present

## 2020-02-27 DIAGNOSIS — F17210 Nicotine dependence, cigarettes, uncomplicated: Secondary | ICD-10-CM | POA: Diagnosis not present

## 2020-02-27 DIAGNOSIS — I420 Dilated cardiomyopathy: Secondary | ICD-10-CM | POA: Insufficient documentation

## 2020-02-27 DIAGNOSIS — K219 Gastro-esophageal reflux disease without esophagitis: Secondary | ICD-10-CM | POA: Insufficient documentation

## 2020-02-27 DIAGNOSIS — Z7982 Long term (current) use of aspirin: Secondary | ICD-10-CM | POA: Diagnosis not present

## 2020-02-27 DIAGNOSIS — Z7951 Long term (current) use of inhaled steroids: Secondary | ICD-10-CM | POA: Insufficient documentation

## 2020-02-27 DIAGNOSIS — I110 Hypertensive heart disease with heart failure: Principal | ICD-10-CM | POA: Insufficient documentation

## 2020-02-27 DIAGNOSIS — E119 Type 2 diabetes mellitus without complications: Secondary | ICD-10-CM | POA: Insufficient documentation

## 2020-02-27 DIAGNOSIS — Z5329 Procedure and treatment not carried out because of patient's decision for other reasons: Secondary | ICD-10-CM | POA: Diagnosis not present

## 2020-02-27 DIAGNOSIS — Z791 Long term (current) use of non-steroidal anti-inflammatories (NSAID): Secondary | ICD-10-CM | POA: Insufficient documentation

## 2020-02-27 DIAGNOSIS — I5041 Acute combined systolic (congestive) and diastolic (congestive) heart failure: Secondary | ICD-10-CM

## 2020-02-27 DIAGNOSIS — J441 Chronic obstructive pulmonary disease with (acute) exacerbation: Secondary | ICD-10-CM | POA: Diagnosis present

## 2020-02-27 DIAGNOSIS — I10 Essential (primary) hypertension: Secondary | ICD-10-CM | POA: Diagnosis present

## 2020-02-27 DIAGNOSIS — G4733 Obstructive sleep apnea (adult) (pediatric): Secondary | ICD-10-CM | POA: Diagnosis not present

## 2020-02-27 DIAGNOSIS — R7989 Other specified abnormal findings of blood chemistry: Secondary | ICD-10-CM | POA: Insufficient documentation

## 2020-02-27 DIAGNOSIS — J9612 Chronic respiratory failure with hypercapnia: Secondary | ICD-10-CM | POA: Insufficient documentation

## 2020-02-27 DIAGNOSIS — I214 Non-ST elevation (NSTEMI) myocardial infarction: Secondary | ICD-10-CM | POA: Diagnosis present

## 2020-02-27 DIAGNOSIS — Z79899 Other long term (current) drug therapy: Secondary | ICD-10-CM | POA: Insufficient documentation

## 2020-02-27 DIAGNOSIS — R778 Other specified abnormalities of plasma proteins: Secondary | ICD-10-CM | POA: Diagnosis present

## 2020-02-27 LAB — ECHOCARDIOGRAM COMPLETE
Height: 66 in
Weight: 4960 oz

## 2020-02-27 LAB — TROPONIN I (HIGH SENSITIVITY)
Troponin I (High Sensitivity): 99 ng/L — ABNORMAL HIGH (ref <18)
Troponin I (High Sensitivity): 485 ng/L (ref <18)
Troponin I (High Sensitivity): 6346 ng/L (ref <18)

## 2020-02-27 LAB — BASIC METABOLIC PANEL
Anion gap: 11 (ref 5–15)
BUN: 19 mg/dL (ref 6–20)
CO2: 28 mmol/L (ref 22–32)
Calcium: 8.7 mg/dL — ABNORMAL LOW (ref 8.9–10.3)
Chloride: 97 mmol/L — ABNORMAL LOW (ref 98–111)
Creatinine, Ser: 1.07 mg/dL (ref 0.61–1.24)
GFR calc Af Amer: >60 mL/min (ref >60)
GFR calc non Af Amer: >60 mL/min (ref >60)
Glucose, Bld: 229 mg/dL — ABNORMAL HIGH (ref 70–99)
Potassium: 4.1 mmol/L (ref 3.5–5.1)
Sodium: 136 mmol/L (ref 135–145)

## 2020-02-27 LAB — HEPATIC FUNCTION PANEL
ALT: 44 U/L (ref 0–44)
AST: 47 U/L — ABNORMAL HIGH (ref 15–41)
Albumin: 3.6 g/dL (ref 3.5–5.0)
Alkaline Phosphatase: 230 U/L — ABNORMAL HIGH (ref 38–126)
Bilirubin, Direct: 0.1 mg/dL (ref 0.0–0.2)
Indirect Bilirubin: 0.7 mg/dL (ref 0.3–0.9)
Total Bilirubin: 0.8 mg/dL (ref 0.3–1.2)
Total Protein: 8 g/dL (ref 6.5–8.1)

## 2020-02-27 LAB — SARS CORONAVIRUS 2 BY RT PCR (HOSPITAL ORDER, PERFORMED IN ~~LOC~~ HOSPITAL LAB): SARS Coronavirus 2: NEGATIVE

## 2020-02-27 LAB — GLUCOSE, CAPILLARY
Glucose-Capillary: 244 mg/dL — ABNORMAL HIGH (ref 70–99)
Glucose-Capillary: 262 mg/dL — ABNORMAL HIGH (ref 70–99)
Glucose-Capillary: 219 mg/dL — ABNORMAL HIGH (ref 70–99)

## 2020-02-27 LAB — APTT: aPTT: >160 seconds (ref 24–36)

## 2020-02-27 LAB — CBC
HCT: 45.9 % (ref 39.0–52.0)
Hemoglobin: 16.2 g/dL (ref 13.0–17.0)
MCH: 32.9 pg (ref 26.0–34.0)
MCHC: 35.3 g/dL (ref 30.0–36.0)
MCV: 93.3 fL (ref 80.0–100.0)
Platelets: 161 10*3/uL (ref 150–400)
RBC: 4.92 MIL/uL (ref 4.22–5.81)
RDW: 12 % (ref 11.5–15.5)
WBC: 6 10*3/uL (ref 4.0–10.5)
nRBC: 0 % (ref 0.0–0.2)

## 2020-02-27 LAB — LIPASE, BLOOD: Lipase: 46 U/L (ref 11–51)

## 2020-02-27 LAB — HIV ANTIBODY (ROUTINE TESTING W REFLEX): HIV Screen 4th Generation wRfx: NONREACTIVE

## 2020-02-27 LAB — BRAIN NATRIURETIC PEPTIDE: B Natriuretic Peptide: 338.1 pg/mL — ABNORMAL HIGH (ref 0.0–100.0)

## 2020-02-27 LAB — HEMOGLOBIN A1C
Hgb A1c MFr Bld: 8.5 % — ABNORMAL HIGH (ref 4.8–5.6)
Mean Plasma Glucose: 197.25 mg/dL

## 2020-02-27 LAB — HEPARIN LEVEL (UNFRACTIONATED): Heparin Unfractionated: 1.59 IU/mL — ABNORMAL HIGH (ref 0.30–0.70)

## 2020-02-27 MED ORDER — FUROSEMIDE 10 MG/ML IJ SOLN
40.0000 mg | Freq: Two times a day (BID) | INTRAMUSCULAR | Status: DC
  Administered 2020-02-27: 40 mg via INTRAVENOUS
  Filled 2020-02-27: qty 4

## 2020-02-27 MED ORDER — IPRATROPIUM-ALBUTEROL 0.5-2.5 (3) MG/3ML IN SOLN
3.0000 mL | Freq: Once | RESPIRATORY_TRACT | Status: AC
  Administered 2020-02-27: 3 mL via RESPIRATORY_TRACT
  Filled 2020-02-27: qty 3

## 2020-02-27 MED ORDER — ALUM & MAG HYDROXIDE-SIMETH 200-200-20 MG/5ML PO SUSP
30.0000 mL | Freq: Three times a day (TID) | ORAL | Status: DC
  Administered 2020-02-27 – 2020-02-28 (×3): 30 mL via ORAL
  Filled 2020-02-27 (×4): qty 30

## 2020-02-27 MED ORDER — SODIUM CHLORIDE 0.9 % IV SOLN: INTRAVENOUS | Status: DC

## 2020-02-27 MED ORDER — IPRATROPIUM-ALBUTEROL 0.5-2.5 (3) MG/3ML IN SOLN: 3.0000 mL | Freq: Four times a day (QID) | RESPIRATORY_TRACT | Status: DC | PRN

## 2020-02-27 MED ORDER — METOPROLOL SUCCINATE ER 25 MG PO TB24
25.0000 mg | ORAL_TABLET | Freq: Every day | ORAL | Status: DC
  Administered 2020-02-27 – 2020-02-28 (×2): 25 mg via ORAL
  Filled 2020-02-27 (×2): qty 1

## 2020-02-27 MED ORDER — HEPARIN (PORCINE) 25000 UT/250ML-% IV SOLN
1400.0000 [IU]/h | INTRAVENOUS | Status: DC
  Administered 2020-02-27: 1400 [IU]/h via INTRAVENOUS
  Filled 2020-02-27: qty 250

## 2020-02-27 MED ORDER — ASPIRIN EC 325 MG PO TBEC
325.0000 mg | DELAYED_RELEASE_TABLET | Freq: Every day | ORAL | Status: DC
  Administered 2020-02-28: 325 mg via ORAL
  Filled 2020-02-27: qty 1

## 2020-02-27 MED ORDER — ALLOPURINOL 100 MG PO TABS
100.0000 mg | ORAL_TABLET | Freq: Every day | ORAL | Status: DC
  Administered 2020-02-27 – 2020-02-28 (×2): 100 mg via ORAL
  Filled 2020-02-27 (×2): qty 1

## 2020-02-27 MED ORDER — AMLODIPINE BESYLATE 5 MG PO TABS
5.0000 mg | ORAL_TABLET | Freq: Every day | ORAL | Status: DC
  Administered 2020-02-27 – 2020-02-28 (×2): 5 mg via ORAL
  Filled 2020-02-27 (×2): qty 1

## 2020-02-27 MED ORDER — SUCRALFATE 1 G PO TABS
1.0000 g | ORAL_TABLET | Freq: Four times a day (QID) | ORAL | Status: DC
  Administered 2020-02-27 – 2020-02-28 (×5): 1 g via ORAL
  Filled 2020-02-27 (×4): qty 1

## 2020-02-27 MED ORDER — SODIUM CHLORIDE 0.9% FLUSH
3.0000 mL | Freq: Two times a day (BID) | INTRAVENOUS | Status: DC
  Administered 2020-02-28: 3 mL via INTRAVENOUS

## 2020-02-27 MED ORDER — MORPHINE SULFATE (PF) 4 MG/ML IV SOLN
4.0000 mg | INTRAVENOUS | Status: DC | PRN
  Administered 2020-02-27 (×2): 4 mg via INTRAVENOUS
  Filled 2020-02-27 (×2): qty 1

## 2020-02-27 MED ORDER — FAMOTIDINE 20 MG PO TABS
20.0000 mg | ORAL_TABLET | Freq: Two times a day (BID) | ORAL | Status: DC
  Administered 2020-02-27 – 2020-02-28 (×2): 20 mg via ORAL
  Filled 2020-02-27 (×2): qty 1

## 2020-02-27 MED ORDER — ATORVASTATIN CALCIUM 20 MG PO TABS
40.0000 mg | ORAL_TABLET | Freq: Every day | ORAL | Status: DC
  Administered 2020-02-27: 40 mg via ORAL
  Filled 2020-02-27: qty 2

## 2020-02-27 MED ORDER — SODIUM CHLORIDE 0.9 % IV SOLN: 250.0000 mL | INTRAVENOUS | Status: DC | PRN

## 2020-02-27 MED ORDER — ENOXAPARIN SODIUM 40 MG/0.4ML ~~LOC~~ SOLN: 40.0000 mg | Freq: Two times a day (BID) | SUBCUTANEOUS | Status: DC

## 2020-02-27 MED ORDER — SODIUM CHLORIDE 0.9% FLUSH
3.0000 mL | Freq: Two times a day (BID) | INTRAVENOUS | Status: DC
  Administered 2020-02-27 – 2020-02-28 (×2): 3 mL via INTRAVENOUS

## 2020-02-27 MED ORDER — TORSEMIDE 20 MG PO TABS
40.0000 mg | ORAL_TABLET | Freq: Every day | ORAL | Status: DC
  Administered 2020-02-28: 40 mg via ORAL
  Filled 2020-02-27: qty 2

## 2020-02-27 MED ORDER — GABAPENTIN 300 MG PO CAPS
300.0000 mg | ORAL_CAPSULE | Freq: Two times a day (BID) | ORAL | Status: DC
  Administered 2020-02-27 – 2020-02-28 (×2): 300 mg via ORAL
  Filled 2020-02-27 (×2): qty 1

## 2020-02-27 MED ORDER — ONDANSETRON HCL 4 MG/2ML IJ SOLN
4.0000 mg | Freq: Once | INTRAMUSCULAR | Status: AC
  Administered 2020-02-27: 4 mg via INTRAVENOUS
  Filled 2020-02-27: qty 2

## 2020-02-27 MED ORDER — SACUBITRIL-VALSARTAN 49-51 MG PO TABS: 1.0000 | ORAL_TABLET | Freq: Two times a day (BID) | ORAL | Status: DC

## 2020-02-27 MED ORDER — FUROSEMIDE 10 MG/ML IJ SOLN
60.0000 mg | Freq: Once | INTRAMUSCULAR | Status: AC
  Administered 2020-02-27: 60 mg via INTRAVENOUS
  Filled 2020-02-27: qty 8

## 2020-02-27 MED ORDER — TIOTROPIUM BROMIDE MONOHYDRATE 18 MCG IN CAPS
18.0000 ug | ORAL_CAPSULE | Freq: Every day | RESPIRATORY_TRACT | Status: DC
  Filled 2020-02-27: qty 5

## 2020-02-27 MED ORDER — SPIRONOLACTONE 25 MG PO TABS
25.0000 mg | ORAL_TABLET | Freq: Every day | ORAL | Status: DC
  Administered 2020-02-27 – 2020-02-28 (×2): 25 mg via ORAL
  Filled 2020-02-27 (×2): qty 1

## 2020-02-27 MED ORDER — ACETAMINOPHEN 325 MG PO TABS: 650.0000 mg | ORAL_TABLET | ORAL | Status: DC | PRN

## 2020-02-27 MED ORDER — ASPIRIN 81 MG PO CHEW
81.0000 mg | CHEWABLE_TABLET | ORAL | Status: AC
  Administered 2020-02-28: 81 mg via ORAL
  Filled 2020-02-27: qty 1

## 2020-02-27 MED ORDER — SODIUM CHLORIDE 0.9% FLUSH: 3.0000 mL | INTRAVENOUS | Status: DC | PRN

## 2020-02-27 MED ORDER — INSULIN ASPART 100 UNIT/ML ~~LOC~~ SOLN
0.0000 [IU] | Freq: Three times a day (TID) | SUBCUTANEOUS | Status: DC
  Administered 2020-02-27: 7 [IU] via SUBCUTANEOUS
  Administered 2020-02-27: 11 [IU] via SUBCUTANEOUS
  Administered 2020-02-28: 7 [IU] via SUBCUTANEOUS
  Filled 2020-02-27 (×3): qty 1

## 2020-02-27 MED ORDER — ASPIRIN 81 MG PO CHEW
324.0000 mg | CHEWABLE_TABLET | Freq: Once | ORAL | Status: AC
  Administered 2020-02-27: 324 mg via ORAL
  Filled 2020-02-27: qty 4

## 2020-02-27 MED ORDER — HEPARIN (PORCINE) 25000 UT/250ML-% IV SOLN
1200.0000 [IU]/h | INTRAVENOUS | Status: DC
  Administered 2020-02-27: 1200 [IU]/h via INTRAVENOUS
  Filled 2020-02-27: qty 250

## 2020-02-27 MED ORDER — ONDANSETRON HCL 4 MG/2ML IJ SOLN: 4.0000 mg | Freq: Four times a day (QID) | INTRAMUSCULAR | Status: DC | PRN

## 2020-02-27 MED ORDER — UMECLIDINIUM-VILANTEROL 62.5-25 MCG/INH IN AEPB
1.0000 | INHALATION_SPRAY | Freq: Every day | RESPIRATORY_TRACT | Status: DC
  Administered 2020-02-27: 1 via RESPIRATORY_TRACT
  Filled 2020-02-27: qty 14

## 2020-02-27 MED ORDER — HEPARIN BOLUS VIA INFUSION
4000.0000 [IU] | Freq: Once | INTRAVENOUS | Status: AC
  Administered 2020-02-27: 4000 [IU] via INTRAVENOUS
  Filled 2020-02-27: qty 4000

## 2020-02-27 MED ORDER — SACUBITRIL-VALSARTAN 49-51 MG PO TABS
1.0000 | ORAL_TABLET | Freq: Two times a day (BID) | ORAL | Status: DC
  Administered 2020-02-27 – 2020-02-28 (×2): 1 via ORAL
  Filled 2020-02-27 (×3): qty 1

## 2020-02-27 NOTE — ED Notes (Signed)
Ate all of breakfast

## 2020-02-27 NOTE — Progress Notes (Signed)
When attempting to get signature on cath consent, patient states he is not having cath done in am. States he will stay tonight for treatment but needs to leave in am. States he will reschedule for later date. Explained the importance of getting done ASAP. Verbalized understanding.

## 2020-02-27 NOTE — ED Triage Notes (Signed)
Constant pain to mid chest described as twisting feeling that goes through to back.  Hx cardiomyopathy, MI, CHF.  No increased swelling. Does feel some SHOB. Pain started acutely 45 min ago.  Pt appears uncomfortable.

## 2020-02-27 NOTE — ED Notes (Signed)
Pt stating he has a family emergency to tend too and wants to speak to the Md about if he really needs to stay. This RN stressed the importance of staying for health and increased troponin. Pt stating he wants to see if he can get cardiac cath moved to a different day. Admitting MD notified

## 2020-02-27 NOTE — ED Notes (Signed)
This RN and Jinny Blossom, RN have messaged pharmacy about missing doses due to medication not being verified. Still waiting response from pharamcy

## 2020-02-27 NOTE — Progress Notes (Signed)
*  PRELIMINARY RESULTS* Echocardiogram 2D Echocardiogram has been performed.  Meric, Joye 02/27/2020, 2:31 PM

## 2020-02-27 NOTE — ED Provider Notes (Signed)
[facility] Emergency Department Provider Note    First MD Initiated Contact with Patient 02/27/20 [phone]     (approximate)  I have reviewed the triage vital signs and the nursing notes.   HISTORY  Chief Complaint Chest Pain    HPI Oscar [name] is a 44 y.o. male with the below listed past medical history presents to the ER for evaluation of chest pressure and shortness of breath starting this morning roughly 1 hour prior to arrival.  States he does have a history of heart failure has a pacemaker.  States the pain is a twisting discomfort in his chest.  States has been compliant with his medications.  He does smoke.  Denies any nausea or vomiting.  No diaphoresis.  States has had similar pain in the past but this is more severe.    Past Medical History:  Diagnosis Date  . AICD (automatic cardioverter/defibrillator) present   . Asthma   . Cardiomyopathy ([place])   . CHF (congestive heart failure) ([place])   . Coronary artery disease   . Deafness in right ear   . Diabetes mellitus without complication ([place])   . Dilated cardiomyopathy ([place])   . Dysrhythmia    svt  . 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 GERD (gastroesophageal reflux disease)   . Hyperlipidemia   . Hypertension   . Hypoxemia   . Hypoxemia   . Mild obesity   . Myocardial infarction ([place])    [id number],5/52  . Pancreatitis   . Sleep apnea    osa   Family History  Problem Relation Age of Onset  . Hypertension Mother   . Congestive Heart Failure Mother   . Hypertension Sister   . Diabetes Sister   . Pancreatitis Sister   . COPD Sister   . Pancreatitis Brother   . Anemia Neg Hx   . Arrhythmia Neg Hx   . Asthma Neg Hx   . Clotting disorder Neg Hx   . Fainting Neg Hx   . Heart attack Neg Hx   . Heart disease Neg Hx   . Heart failure Neg Hx   . Hyperlipidemia Neg Hx    Past Surgical History:  Procedure Laterality Date  . CARDIAC CATHETERIZATION  12/11/2014   Procedure:  RIGHT/LEFT HEART CATH AND CORONARY ANGIOGRAPHY;  Surgeon: Lorretta Harp, MD;  Location: [facility] CATH LAB;  Service: Cardiovascular;;  . ICD LEAD REMOVAL N/A 03/30/2015   Procedure: ICD LEAD REMOVAL;  Surgeon: Marzetta Board, MD;  Location: [facility] ORS;  Service: Cardiovascular;  Laterality: N/A;  . IMPLANTABLE CARDIOVERTER DEFIBRILLATOR IMPLANT    . INSERT / REPLACE / REMOVE PACEMAKER    . LEFT HEART CATHETERIZATION WITH CORONARY ANGIOGRAM N/A 12/09/2014   Procedure: LEFT HEART CATHETERIZATION WITH CORONARY ANGIOGRAM;  Surgeon: Burnell Blanks, MD;  Location: [facility] CATH LAB;  Service: Cardiovascular;  Laterality: N/A;   Patient Active Problem List   Diagnosis Date Noted  . Sprain of foot 01/26/2020  . Foreign body granuloma of skin and subcutaneous tissue 05/26/2019  . Acute pancreatitis 05/19/2017  . Pancreatitis 09/12/2016  . Bronchitis 05/20/2016  . Constipation 11/08/2015  . Abdominal pain 11/02/2015  . Obstructive sleep apnea 07/31/2015  . Tobacco use 07/31/2015  . Diabetes ([place]) 07/10/2015  . Chronic systolic heart failure ([place]) 03/30/2015  . Cardiac pacemaker 03/30/2015  . Chronic respiratory failure with hypercapnia ([place]) 12/12/2014  . Elevated troponin   . Non-ischemic cardiomyopathy ([place])   .  Hypoxemia   . SVT (supraventricular tachycardia) ([place]) 12/09/2014  . NSTEMI (non-ST elevated myocardial infarction) ([place]) 12/09/2014  . Hypertension 12/09/2014      Prior to Admission medications   Medication Sig Start Date End Date Taking? Authorizing Provider  Accu-Chek Softclix Lancets lancets USE TO CHECK BLOOD SUGAR THREE TIMES DAILY 05/20/19   [provider]  albuterol (VENTOLIN HFA) 108 (90 Base) MCG/ACT inhaler 2 inhalations every 6 (six) hours as needed 01/21/19   [provider]  allopurinol (ZYLOPRIM) 100 MG tablet Take by mouth. 11/09/18   [provider]  aluminum-magnesium hydroxide-simethicone (MAALOX) 950-932-67 MG/5ML SUSP Take 30 mLs by mouth  4 (four) times daily -  before meals and at bedtime. 05/18/18   Carrie Mew, MD  amLODipine (NORVASC) 5 MG tablet Take by mouth.    [provider]  aspirin EC 325 MG tablet Take 1 tablet (325 mg total) by mouth daily. 07/10/15   Aldean Jewett, MD  atorvastatin (LIPITOR) 40 MG tablet Take 1 tablet (40 mg total) by mouth daily at 6 PM. 11/04/18   Alisa Graff, FNP  chlorhexidine (HIBICLENS) 4 % external liquid Use as body was daily in the shower 04/18/19   [provider]  famotidine (PEPCID) 20 MG tablet Take 1 tablet (20 mg total) by mouth 2 (two) times daily. 05/18/18   Carrie Mew, MD  gabapentin (NEURONTIN) 300 MG capsule Take 300 mg by mouth 2 (two) times daily.    [provider]  insulin detemir (LEVEMIR FLEXTOUCH) 100 UNIT/ML FlexPen Inject into the skin. 11/10/18   [provider]  insulin glargine (LANTUS) 100 UNIT/ML injection Inject into the skin. 12/07/15   [provider]  insulin starter kit- syringes MISC 1 kit by Other route once. 07/10/15   Aldean Jewett, MD  lidocaine (XYLOCAINE) 2 % solution Use as directed 15 mLs in the mouth or throat every 6 (six) hours as needed (stomach pain). 12/29/19   Nance Pear, MD  meloxicam (MOBIC) 15 MG tablet Take 1 tablet (15 mg total) by mouth daily. 01/26/20   [name], DPM  metoprolol succinate (TOPROL-XL) 25 MG 24 hr tablet Take by mouth. 10/06/19   [provider]  minocycline (MINOCIN) 100 MG capsule Take by mouth daily. 02/08/20   [provider]  omeprazole (PRILOSEC) 40 MG capsule Take by mouth. 03/17/19 03/16/20  [provider]  ondansetron (ZOFRAN ODT) 4 MG disintegrating tablet Take 1 tablet (4 mg total) by mouth every 8 (eight) hours as needed for nausea or vomiting. 01/06/20   Blake Divine, MD  oxyCODONE-acetaminophen (PERCOCET) 5-325 MG tablet Take 1 tablet by mouth every 4 (four) hours as needed for severe pain. 01/06/20 01/05/21  Blake Divine,  MD  sacubitril-valsartan (ENTRESTO) 49-51 MG Take by mouth. 06/13/19   [provider]  spironolactone (ALDACTONE) 25 MG tablet Take by mouth. 09/19/16   [provider]  sucralfate (CARAFATE) 1 g tablet Take 1 tablet (1 g total) by mouth 4 (four) times daily. 12/29/19   Nance Pear, MD  Syringe, Disposable, 1 ML MISC 0.1 mLs by Does not apply route as directed. 08/21/15   Alisa Graff, FNP  tiotropium (SPIRIVA) 18 MCG inhalation capsule Place 18 mcg into inhaler and inhale daily.    [provider]  torsemide (DEMADEX) 20 MG tablet Take 1 tablet (20 mg total) by mouth daily. Patient taking differently: Take 40 mg by mouth daily.  11/04/18   Alisa Graff, FNP  Flossie Buffy  MINI PEN NEEDLES 31G X 6 MM MISC  11/10/18   [provider]  umeclidinium-vilanterol (ANORO ELLIPTA) 62.5-25 MCG/INH AEPB Inhale into the lungs. 01/31/19   [provider]  [place] 18 MG/3ML SOPN  10/17/19   [provider]    Allergies Bidil [isosorb dinitrate-hydralazine] and Ciprofloxacin    Social History Social History   Tobacco Use  . Smoking status: Current Every Day Smoker    Packs/day: 0.50    Years: 24.00    Pack years: 12.00    Types: Cigarettes  . Smokeless tobacco: Never Used  . Tobacco comment: 4/5 Smoking 5 cigs a day  Vaping Use  . Vaping Use: Never used  Substance Use Topics  . Alcohol use: No  . Drug use: No    Review of Systems Patient denies headaches, rhinorrhea, blurry vision, numbness, shortness of breath, chest pain, edema, cough, abdominal pain, nausea, vomiting, diarrhea, dysuria, fevers, rashes or hallucinations unless otherwise stated above in HPI. ____________________________________________   PHYSICAL EXAM:  VITAL SIGNS: Vitals:   02/27/20 0737 02/27/20 0800  BP: (!) 153/109 (!) 143/108  Pulse: 89 81  Resp: 20 16  Temp: 98.3 F (36.8 C)   SpO2: 96% 98%    Constitutional: Alert and oriented.  Eyes: Conjunctivae  are normal.  Head: Atraumatic. Nose: No congestion/rhinnorhea. Mouth/Throat: Mucous membranes are moist.   Neck: No stridor. Painless ROM.  Cardiovascular: Normal rate, regular rhythm. Grossly normal heart sounds.  Good peripheral circulation. Respiratory: mild tachypnea. No retractions. Lungs wth diminished bibasilar bs,  Anterior exp wheeze Gastrointestinal: Soft and nontender. No distention. No abdominal bruits. No CVA tenderness. Genitourinary:  Musculoskeletal: No lower extremity tenderness nor edema.  No joint effusions. Neurologic:  Normal speech and language. No gross focal neurologic deficits are appreciated. No facial droop Skin:  Skin is warm, dry and intact. No rash noted. Psychiatric: Mood and affect are normal. Speech and behavior are normal.  ____________________________________________   LABS (all labs ordered are listed, but only abnormal results are displayed)  Results for orders placed or performed during the hospital encounter of 02/27/20 (from the past 24 hour(s))  Basic metabolic panel     Status: Abnormal   Collection Time: 02/27/20  7:37 AM  Result Value Ref Range   Sodium 136 135 - 145 mmol/L   Potassium 4.1 3.5 - 5.1 mmol/L   Chloride 97 (L) 98 - 111 mmol/L   CO2 28 22 - 32 mmol/L   Glucose, Bld 229 (H) 70 - 99 mg/dL   BUN 19 6 - 20 mg/dL   Creatinine, Ser 1.07 0.61 - 1.24 mg/dL   Calcium 8.7 (L) 8.9 - 10.3 mg/dL   GFR calc non Af Amer >60 >60 mL/min   GFR calc Af Amer >60 >60 mL/min   Anion gap 11 5 - 15  CBC     Status: None   Collection Time: 02/27/20  7:37 AM  Result Value Ref Range   WBC 6.0 4.0 - 10.5 K/uL   RBC 4.92 4.22 - 5.81 MIL/uL   Hemoglobin 16.2 13.0 - 17.0 g/dL   HCT 45.9 39 - 52 %   MCV 93.3 80.0 - 100.0 fL   MCH 32.9 26.0 - 34.0 pg   MCHC 35.3 30.0 - 36.0 g/dL   RDW 12.0 11.5 - 15.5 %   Platelets 161 150 - 400 K/uL   nRBC 0.0 0.0 - 0.2 %  Troponin I (High Sensitivity)     Status: Abnormal   Collection Time: 02/27/20  7:37 AM   Result Value Ref Range   Troponin I (High Sensitivity) 99 (H) <18 ng/L  Brain natriuretic peptide     Status: Abnormal   Collection Time: 02/27/20  7:37 AM  Result Value Ref Range   B Natriuretic Peptide 338.1 (H) 0.0 - 100.0 pg/mL  Hepatic function panel     Status: Abnormal   Collection Time: 02/27/20  7:37 AM  Result Value Ref Range   Total Protein 8.0 6.5 - 8.1 g/dL   Albumin 3.6 3.5 - 5.0 g/dL   AST 47 (H) 15 - 41 U/L   ALT 44 0 - 44 U/L   Alkaline Phosphatase 230 (H) 38 - 126 U/L   Total Bilirubin 0.8 0.3 - 1.2 mg/dL   Bilirubin, Direct 0.1 0.0 - 0.2 mg/dL   Indirect Bilirubin 0.7 0.3 - 0.9 mg/dL  Lipase, blood     Status: None   Collection Time: 02/27/20  7:37 AM  Result Value Ref Range   Lipase 46 11 - 51 U/L   ____________________________________________  EKG My review and personal interpretation at Time: 7:31   Indication: chest pain  Rate: 90  Rhythm: sinus Axis: left Other: lbbb, nonspecific st and tw abn,  C/w previous tracings ____________________________________________  RADIOLOGY  I personally reviewed all radiographic images ordered to evaluate for the above acute complaints and reviewed radiology reports and findings.  These findings were personally discussed with the patient.  Please see medical record for radiology report.  ____________________________________________   PROCEDURES  Procedure(s) performed:  Procedures    Critical Care performed: no ____________________________________________   INITIAL IMPRESSION / ASSESSMENT AND PLAN / ED COURSE  Pertinent labs & imaging results that were available during my care of the patient were reviewed by me and considered in my medical decision making (see chart for details).   DDX: ACS, pericarditis, esophagitis, boerhaaves, pe, dissection, pna, bronchitis, costochondritis   TRAYVOND VIETS is a 44 y.o. who presents to the ED with symptoms as described above.  Patient uncomfortable appearing.  His  EKG is abnormal but is consistent with previous.  His exam concerning for CHF versus COPD exacerbation.  Less likely PE or dissection.  Possible ACS.  Troponin is elevated but he seems to have chronically elevated troponins likely secondary to his cardiomyopathy CHF.  Given his presentation today I suspect this is more CHF exacerbation.  He is currently pain-free but I have ordered Lasix will give aspirin.  Will require hospitalization for diuresis.  Have also given nebulizers given his wheezing but I think this is more CHF today.Have discussed with the patient and available family all diagnostics and treatments performed thus far and all questions were answered to the best of my ability. The patient demonstrates understanding and agreement with plan.      The patient was evaluated in Emergency Department today for the symptoms described in the history of present illness. He/she was evaluated in the context of the global COVID-19 pandemic, which necessitated consideration that the patient might be at risk for infection with the SARS-CoV-2 virus that causes COVID-19. Institutional protocols and algorithms that pertain to the evaluation of patients at risk for COVID-19 are in a state of rapid change based on information released by regulatory bodies including the CDC and federal and state organizations. These policies and algorithms were followed during the patient's care in the ED.  As part of my medical decision making, I reviewed the following data within the [facility] notes reviewed  and incorporated, Labs reviewed, notes from prior ED visits and [place] Controlled Substance Database   ____________________________________________   FINAL CLINICAL IMPRESSION(S) / ED DIAGNOSES  Final diagnoses:  Chest pain, unspecified type  Acute on chronic congestive heart failure, unspecified heart failure type ([place])      NEW MEDICATIONS STARTED DURING THIS VISIT:  New Prescriptions    No medications on file     Note:  This document was prepared using Dragon voice recognition software and may include unintentional dictation errors.    Merlyn Lot, MD 02/27/20 [phone]

## 2020-02-27 NOTE — Consult Note (Signed)
[place] for Heparin Indication: chest pain/ACS  Allergies  Allergen Reactions  . Bidil [Isosorb Dinitrate-Hydralazine] Other (See Comments)    Migraine Headache Migraine Headache  . Ciprofloxacin     Patient Measurements: Height: 5\' 6"  (167.6 cm) Weight: (!) 147.9 kg (326 lb) IBW/kg (Calculated) : 63.8 Heparin Dosing Weight: 100.2 kg  Vital Signs: Temp: 98.3 F (36.8 C) (06/28 0737) Temp Source: Oral (06/28 0737) BP: 142/99 (06/28 0915) Pulse Rate: 74 (06/28 0915)  Labs: Recent Labs    02/27/20 0737 02/27/20 0913  HGB 16.2  --   HCT 45.9  --   PLT 161  --   CREATININE 1.07  --   TROPONINIHS 99* 485*    Estimated Creatinine Clearance: 121.4 mL/min (by C-G formula based on SCr of 1.07 mg/dL).   Medical History: Past Medical History:  Diagnosis Date  . AICD (automatic cardioverter/defibrillator) present   . Asthma   . Cardiomyopathy ([place])   . CHF (congestive heart failure) ([place])   . Coronary artery disease   . Deafness in right ear   . Diabetes mellitus without complication ([place])   . Dilated cardiomyopathy ([place])   . Dysrhythmia    svt  . 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 GERD (gastroesophageal reflux disease)   . Hyperlipidemia   . Hypertension   . Hypoxemia   . Hypoxemia   . Mild obesity   . Myocardial infarction ([place])    9233,0076,2/26  . Pancreatitis   . Sleep apnea    osa    Medications:  (Not in a hospital admission)  Scheduled:  . allopurinol  100 mg Oral Daily  . alum & mag hydroxide-simeth  30 mL Oral TID AC & HS  . amLODipine  5 mg Oral Daily  . [START ON 02/28/2020] aspirin EC  325 mg Oral Daily  . atorvastatin  40 mg Oral Daily  . famotidine  20 mg Oral BID  . furosemide  40 mg Intravenous Q12H  . gabapentin  300 mg Oral BID  . insulin aspart  0-20 Units Subcutaneous TID WC  . metoprolol succinate  25 mg Oral Daily  . sacubitril-valsartan  1 tablet Oral BID  . sodium chloride  flush  3 mL Intravenous Q12H  . spironolactone  25 mg Oral Daily  . sucralfate  1 g Oral QID  . [START ON 02/28/2020] tiotropium  18 mcg Inhalation Daily  . umeclidinium-vilanterol  1 puff Inhalation Daily   Infusions:  . sodium chloride     PRN: sodium chloride, acetaminophen, ipratropium-albuterol, morphine injection, ondansetron (ZOFRAN) IV, sodium chloride flush Anti-infectives (From admission, onward)   None      Assessment: Pharmacy consulted to start heparin for ACS. No DOAC noted PTA.   Goal of Therapy:  Heparin level 0.3-0.7 units/ml Monitor platelets by anticoagulation protocol: Yes   Plan:  Give 4000 units bolus x 1 Start heparin infusion at 1400 units/hr Check anti-Xa level in 6 hours and daily while on heparin Continue to monitor H&H and platelets  Oswald Hillock, PharmD, BCPS 02/27/2020,10:28 AM

## 2020-02-27 NOTE — ED Notes (Signed)
Message sent to pharmacy regarding verifying patient's medications.

## 2020-02-27 NOTE — ED Notes (Signed)
Pt given cranberry juice and graham crackers. Pt denies any further needs at this time

## 2020-02-27 NOTE — Progress Notes (Addendum)
   02/27/20 2026  ReDS Vest / Clip  BMI (Calculated) 50.64  Reds vest reading not performed due to BMI

## 2020-02-27 NOTE — ED Notes (Signed)
Pt stating he wants to sit in chair for comfort. Pt IV pole and call bell moved to accommodate pt.

## 2020-02-27 NOTE — ED Notes (Signed)
Date and time results received: 02/27/20 1306 (use smartphrase ".now" to insert current time)  Test: Troponin Critical Value: 6,346  Name of Provider Notified: Dr. Francine Graven  Orders Received? Or Actions Taken?: Critical results acknowledged by MD

## 2020-02-27 NOTE — Consult Note (Signed)
CARDIOLOGY CONSULT NOTE               Patient ID: Oscar [name] MRN: [id number] DOB/AGE: 1975/12/09 44 y.o.  Admit date: 02/27/2020 Referring Physician [place] Primary Physician [place] Primary Cardiologist [facility] Reason for Consultation NSTEMI  HPI: 44 year old gentleman referred for evaluation of NSTEMI. The patient has a history of nonischemic cardiomyopathy with LVEF 10-20%, status post dual chamber AICD in 5027, chronic systolic CHF, previous NSTEMIs in 2014 and 2016, normal coronary anatomy per left cardiac catheterization in 2016, LBBB, OSA without CPAP use, supplemental oxygen at night, type II diabetes, history of paroxsymal SVT, hypertension, hyperlipidemia, and OSA. The patient reports that he has been doing well recently. He was in his usual state of health until this morning when he woke up with centralized, twisting chest pain with associated shortness of breath with radiation straight through to his back. He took all of his morning medications, as he states he did not take any of his medications yesterday because he forgot. The pain persisted and worsened for about 1 hour, so he presented to [facility] ER for evaluation. He states that he has had chest pain like this before, but not as severe, and not recently. In the ER, labs were notable for elevated high sensitivity troponin of 99, followed by 485, BNP 338 (which appears chronically elevated), and normal renal function. ECG revealed sinus rhythm with occasional PVCs with known LBBB, with nonspecific T wave abnormalities, which is largely unchanged from previous ECGs. Chest xray revealed mild cardiomegaly with central pulmonary vascular congestion. The patient received morphine with relief of chest pain without recurrence, and started on heparin drip. He reports feeling fine at this time. Nuclear stress test in 01/2019 revealed no evidence of ischemia, with persistent inferior scar versus artifact with  LVEF 11%. 2D echocardiogram on 03/01/2019 revealed severe left ventricular systolic dysfunction with LVEF 10-20% with severe LVH.  Review of systems complete and found to be negative unless listed above     Past Medical History:  Diagnosis Date  . AICD (automatic cardioverter/defibrillator) present   . Asthma   . Cardiomyopathy ([place])   . CHF (congestive heart failure) ([place])   . Coronary artery disease   . Deafness in right ear   . Diabetes mellitus without complication ([place])   . Dilated cardiomyopathy ([place])   . Dysrhythmia    svt  . 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 GERD (gastroesophageal reflux disease)   . Hyperlipidemia   . Hypertension   . Hypoxemia   . Hypoxemia   . Mild obesity   . Myocardial infarction ([place])    7412,8786,7/67  . Pancreatitis   . Sleep apnea    osa    Past Surgical History:  Procedure Laterality Date  . CARDIAC CATHETERIZATION  12/11/2014   Procedure: RIGHT/LEFT HEART CATH AND CORONARY ANGIOGRAPHY;  Surgeon: Lorretta Harp, MD;  Location: [facility] CATH LAB;  Service: Cardiovascular;;  . ICD LEAD REMOVAL N/A 03/30/2015   Procedure: ICD LEAD REMOVAL;  Surgeon: Marzetta Board, MD;  Location: [facility] ORS;  Service: Cardiovascular;  Laterality: N/A;  . IMPLANTABLE CARDIOVERTER DEFIBRILLATOR IMPLANT    . INSERT / REPLACE / REMOVE PACEMAKER    . LEFT HEART CATHETERIZATION WITH CORONARY ANGIOGRAM N/A 12/09/2014   Procedure: LEFT HEART CATHETERIZATION WITH CORONARY ANGIOGRAM;  Surgeon: Burnell Blanks, MD;  Location: [facility] CATH LAB;  Service: Cardiovascular;  Laterality: N/A;    (Not in a hospital  admission)  Social History   Socioeconomic History  . Marital status: Single    Spouse name: Not on file  . Number of children: Not on file  . Years of education: Not on file  . Highest education level: Not on file  Occupational History  . Occupation: unemployed  Tobacco Use  . Smoking status: Current Every Day Smoker    Packs/day: 0.50    Years:  24.00    Pack years: 12.00    Types: Cigarettes  . Smokeless tobacco: Never Used  . Tobacco comment: 4/5 Smoking 5 cigs a day  Vaping Use  . Vaping Use: Never used  Substance and Sexual Activity  . Alcohol use: No  . Drug use: No  . Sexual activity: Yes  Other Topics Concern  . Not on file  Social History Narrative  . Not on file   Social Determinants of Health   Financial Resource Strain:   . Difficulty of Paying Living Expenses:   Food Insecurity:   . Worried About Charity fundraiser in the Last Year:   . Arboriculturist in the Last Year:   Transportation Needs:   . Film/video editor (Medical):   Marland Kitchen Lack of Transportation (Non-Medical):   Physical Activity:   . Days of Exercise per Week:   . Minutes of Exercise per Session:   Stress:   . Feeling of Stress :   Social Connections:   . Frequency of Communication with Friends and Family:   . Frequency of Social Gatherings with Friends and Family:   . Attends Religious Services:   . Active Member of Clubs or Organizations:   . Attends Archivist Meetings:   Marland Kitchen Marital Status:   Intimate Partner Violence:   . Fear of Current or Ex-Partner:   . Emotionally Abused:   Marland Kitchen Physically Abused:   . Sexually Abused:     Family History  Problem Relation Age of Onset  . Hypertension Mother   . Congestive Heart Failure Mother   . Hypertension Sister   . Diabetes Sister   . Pancreatitis Sister   . COPD Sister   . Pancreatitis Brother   . Anemia Neg Hx   . Arrhythmia Neg Hx   . Asthma Neg Hx   . Clotting disorder Neg Hx   . Fainting Neg Hx   . Heart attack Neg Hx   . Heart disease Neg Hx   . Heart failure Neg Hx   . Hyperlipidemia Neg Hx       Review of systems complete and found to be negative unless listed above      PHYSICAL EXAM  General: Well developed, well nourished, in no acute distress, standing up beside bed HEENT:  Normocephalic and atramatic Neck:  No JVD.  Lungs: normal effort of  breathing on supplemental O2 via [place], diminished bibasilar breath sounds, no wheezing or crackles Heart: HRRR . Normal S1 and S2 without gallops or murmurs.  Abdomen: does not appear distended  Msk:  Back normal, gait not assessed. Normal strength and tone for age. Extremities: No clubbing, cyanosis or edema.   Neuro: Alert and oriented X 3. Psych:  Good affect, responds appropriately  Labs:   Lab Results  Component Value Date   WBC 6.0 02/27/2020   HGB 16.2 02/27/2020   HCT 45.9 02/27/2020   MCV 93.3 02/27/2020   PLT 161 02/27/2020    Recent Labs  Lab 02/27/20 0737  NA 136  K 4.1  CL 97*  CO2 28  BUN 19  CREATININE 1.07  CALCIUM 8.7*  PROT 8.0  BILITOT 0.8  ALKPHOS 230*  ALT 44  AST 47*  GLUCOSE 229*   Lab Results  Component Value Date   CKTOTAL 257 (H) 12/19/2012   CKMB 2.0 08/11/2014   TROPONINI 0.07 (HH) 02/04/2018    Lab Results  Component Value Date   CHOL 247 (H) 09/12/2016   CHOL 234 (H) 07/07/2016   CHOL 272 (A) 10/18/2015   Lab Results  Component Value Date   HDL 31 (L) 09/12/2016   HDL 31 (L) 07/07/2016   HDL 34 (A) 10/18/2015   Lab Results  Component Value Date   LDLCALC 177 (H) 09/12/2016   LDLCALC 179 (H) 07/07/2016   LDLCALC 215 10/18/2015   Lab Results  Component Value Date   TRIG 157 (H) 05/19/2017   TRIG 193 (H) 09/12/2016   TRIG 121 07/07/2016   Lab Results  Component Value Date   CHOLHDL 8.0 09/12/2016   CHOLHDL 7.5 07/07/2016   CHOLHDL 8.4 12/10/2014   No results found for: LDLDIRECT    Radiology: DG Chest 2 View  Result Date: 02/27/2020 CLINICAL DATA:  Chest pain. EXAM: CHEST - 2 VIEW COMPARISON:  May 18, 2017. FINDINGS: Mild cardiomegaly with central pulmonary vascular congestion. Left-sided pacemaker is unchanged in position. No pneumothorax or pleural effusion is noted. No consolidative process is noted. Bony thorax is unremarkable. IMPRESSION: Mild cardiomegaly with central pulmonary vascular congestion.  Electronically Signed   By: Marijo Conception M.D.   On: 02/27/2020 08:05    EKG: sinus rhythm, occasional PVCs, chronic LBBB, nonspecific T wave abnormalities  ASSESSMENT AND PLAN:  1. NSTEMI, presenting with centralized, twisting chest pain that occurred while lying in bed, with elevated high sensitivity troponin (99, 485), with ECG revealing sinus rhythm with occasional PVCs with chronic LBBB and nonspecific T wave abnormalities, largely unchanged from previous ECG. Chest pain resolved after morphine. The patient has normal coronary anatomy per cardiac catheterization in 2016. Nuclear stress test in 01/2019 revealed no evidence of ischemia, with persistent inferior scar versus artifact with LVEF 11%.  2. Nonischemic cardiomyopathy with LVEF 10-20%, status post dual chamber ICD 3. Chronic systolic CHF, appears mildly fluid overloaded, BNP elevated to >300, receiving IV Lasix. On Entresto, metoprolol succinate, spironolactone,  4. Hypertension 5. Type II diabetes, on insulin   Recommendations: 1. Agree with current therapy 2. Continue heparin drip for now 3. Continue IV Lasix 40 mg BID with careful monitoring of renal status 4. Continue Entresto, metoprolol succinate, atorvastatin, aspirin, spironolactone 5. NPO after midnight in the event patient undergoes nuclear stress testing or cardiac catheterization in the morning.  6. Continue to trend troponin 7. Further recommendations pending patient's initial course   Signed: Sharolyn Douglas 02/27/2020, 10:56 AM

## 2020-02-27 NOTE — Consult Note (Signed)
[place] for Heparin Indication: chest pain/ACS  Allergies  Allergen Reactions  . Bidil [Isosorb Dinitrate-Hydralazine] Other (See Comments)    Migraine Headache Migraine Headache  . Ciprofloxacin     Patient Measurements: Height: 5\' 6"  (167.6 cm) Weight: (!) 140.6 kg (310 lb) IBW/kg (Calculated) : 63.8 Heparin Dosing Weight: 100.2 kg  Vital Signs: Temp: 98.3 F (36.8 C) (06/28 0737) Temp Source: Oral (06/28 0737) BP: 133/93 (06/28 1748) Pulse Rate: 72 (06/28 1748)  Labs: Recent Labs    02/27/20 0737 02/27/20 0913 02/27/20 1201 02/27/20 1849  HGB 16.2  --   --   --   HCT 45.9  --   --   --   PLT 161  --   --   --   HEPARINUNFRC  --   --   --  1.59*  CREATININE 1.07  --   --   --   TROPONINIHS 99* 485* 6,346*  --     Estimated Creatinine Clearance: 117.8 mL/min (by C-G formula based on SCr of 1.07 mg/dL).   Medical History: Past Medical History:  Diagnosis Date  . AICD (automatic cardioverter/defibrillator) present   . Asthma   . Cardiomyopathy ([place])   . CHF (congestive heart failure) ([place])   . Coronary artery disease   . Deafness in right ear   . Diabetes mellitus without complication ([place])   . Dilated cardiomyopathy ([place])   . Dysrhythmia    svt  . 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 GERD (gastroesophageal reflux disease)   . Hyperlipidemia   . Hypertension   . Hypoxemia   . Hypoxemia   . Mild obesity   . Myocardial infarction ([place])    6712,4580,9/98  . Pancreatitis   . Sleep apnea    osa    Medications:  (Not in a hospital admission)  Scheduled:  . allopurinol  100 mg Oral Daily  . alum & mag hydroxide-simeth  30 mL Oral TID AC & HS  . amLODipine  5 mg Oral Daily  . [START ON 02/28/2020] aspirin EC  325 mg Oral Daily  . atorvastatin  40 mg Oral Daily  . famotidine  20 mg Oral BID  . gabapentin  300 mg Oral BID  . insulin aspart  0-20 Units Subcutaneous TID WC  . metoprolol succinate  25  mg Oral Daily  . sacubitril-valsartan  1 tablet Oral BID  . sodium chloride flush  3 mL Intravenous Q12H  . sodium chloride flush  3 mL Intravenous Q12H  . spironolactone  25 mg Oral Daily  . sucralfate  1 g Oral QID  . [START ON 02/28/2020] tiotropium  18 mcg Inhalation Daily  . [START ON 02/28/2020] torsemide  40 mg Oral Daily  . umeclidinium-vilanterol  1 puff Inhalation Daily   Infusions:  . sodium chloride    . heparin     PRN: sodium chloride, acetaminophen, ipratropium-albuterol, morphine injection, ondansetron (ZOFRAN) IV, sodium chloride flush Anti-infectives (From admission, onward)   None      Assessment: Pharmacy consulted to start heparin for ACS. No DOAC noted PTA.   Give 4000 units bolus x 1 Start heparin infusion at 1400 units/hr  Goal of Therapy:  Heparin level 0.3-0.7 units/ml Monitor platelets by anticoagulation protocol: Yes   Plan:  6/28 @1849  HL= 1.59. supratherapeutic. Will hold drip x 1 hour and resume at lower rate of 1200 units/hr. Confirmed with RN that Heparin was paused and flushed prior  to draw but was drawn on the same arm that Heparin is running in.  reCheck anti-Xa level in 6 hours and daily while on heparin Continue to monitor H&H and platelets  Lenita Peregrina A, PharmD, BCPS 02/27/2020,7:23 PM

## 2020-02-27 NOTE — ED Notes (Signed)
Dr Francine Graven aware of troponin 485, she is placing cardiology consult.

## 2020-02-27 NOTE — H&P (Signed)
History and Physical    Oscar [name] IZT:[id number] DOB: May 25, 1976 DOA: 02/27/2020  PCP: [facility], [facility]   Patient coming from: Home  I have personally briefly reviewed patient's old medical records in [place]  Chief Complaint: Chest pain  HPI: Oscar [name] is a 44 y.o. male with medical history significant for chronic systolic heart failure, dilated cardiomyopathy status post AICD insertion ( LVEF 20 - 25%), obesity, coronary artery disease, diabetes mellitus and obstructive sleep apnea who presents to the emergency room for evaluation of chest pain.  Chest pain started acutely about 45 minutes prior to his presentation to the emergency room. It is midsternal, pressure-like with radiation to the back and he rates it an 8 x 10 in intensity at its worst.  Chest pain is associated with shortness of breath but he denies having any nausea, no vomiting, no diaphoresis or palpitations.  Patient states that he has had pain like this in the past but this is more severe. Labs reveal a BNP of 338 and elevated troponin of 99 Chest x-ray shows mild cardiomegaly with central pulmonary vascular congestion. Twelve-lead EKG shows sinus rhythm with PVCs and left axis deviation.  ED Course: Patient is a 44 year old African-American male with multiple medical problems who presents to the ER for evaluation of chest pain that started acutely while at rest.  Troponin is elevated but appears to be chronically elevated most likely secondary to his cardiomyopathy.  Twelve-lead EKG is abnormal but unchanged from prior EKGs.  Patient will be admitted to the hospital for acute CHF exacerbation.  Review of Systems: As per HPI otherwise 10 point review of systems negative.    Past Medical History:  Diagnosis Date  . AICD (automatic cardioverter/defibrillator) present   . Asthma   . Cardiomyopathy ([place])   . CHF (congestive heart failure) ([place])   . Coronary artery disease   .  Deafness in right ear   . Diabetes mellitus without complication ([place])   . Dilated cardiomyopathy ([place])   . Dysrhythmia    svt  . 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 GERD (gastroesophageal reflux disease)   . Hyperlipidemia   . Hypertension   . Hypoxemia   . Hypoxemia   . Mild obesity   . Myocardial infarction ([place])    3825,0539,7/67  . Pancreatitis   . Sleep apnea    osa    Past Surgical History:  Procedure Laterality Date  . CARDIAC CATHETERIZATION  12/11/2014   Procedure: RIGHT/LEFT HEART CATH AND CORONARY ANGIOGRAPHY;  Surgeon: Lorretta Harp, MD;  Location: [facility] CATH LAB;  Service: Cardiovascular;;  . ICD LEAD REMOVAL N/A 03/30/2015   Procedure: ICD LEAD REMOVAL;  Surgeon: Marzetta Board, MD;  Location: [facility] ORS;  Service: Cardiovascular;  Laterality: N/A;  . IMPLANTABLE CARDIOVERTER DEFIBRILLATOR IMPLANT    . INSERT / REPLACE / REMOVE PACEMAKER    . LEFT HEART CATHETERIZATION WITH CORONARY ANGIOGRAM N/A 12/09/2014   Procedure: LEFT HEART CATHETERIZATION WITH CORONARY ANGIOGRAM;  Surgeon: Burnell Blanks, MD;  Location: [facility] CATH LAB;  Service: Cardiovascular;  Laterality: N/A;     reports that he has been smoking cigarettes. He has a 12.00 pack-year smoking history. He has never used smokeless tobacco. He reports that he does not drink alcohol and does not use drugs.  Allergies  Allergen Reactions  . Bidil [Isosorb Dinitrate-Hydralazine] Other (See Comments)    Migraine Headache Migraine Headache  . Ciprofloxacin  Family History  Problem Relation Age of Onset  . Hypertension Mother   . Congestive Heart Failure Mother   . Hypertension Sister   . Diabetes Sister   . Pancreatitis Sister   . COPD Sister   . Pancreatitis Brother   . Anemia Neg Hx   . Arrhythmia Neg Hx   . Asthma Neg Hx   . Clotting disorder Neg Hx   . Fainting Neg Hx   . Heart attack Neg Hx   . Heart disease Neg Hx   . Heart failure Neg Hx   . Hyperlipidemia Neg Hx       Prior to Admission medications   Medication Sig Start Date End Date Taking? Authorizing Provider  Accu-Chek Softclix Lancets lancets USE TO CHECK BLOOD SUGAR THREE TIMES DAILY 05/20/19   [provider]  albuterol (VENTOLIN HFA) 108 (90 Base) MCG/ACT inhaler 2 inhalations every 6 (six) hours as needed 01/21/19   [provider]  allopurinol (ZYLOPRIM) 100 MG tablet Take by mouth. 11/09/18   [provider]  aluminum-magnesium hydroxide-simethicone (MAALOX) [phone] MG/5ML SUSP Take 30 mLs by mouth 4 (four) times daily -  before meals and at bedtime. 05/18/18   Carrie Mew, MD  amLODipine (NORVASC) 5 MG tablet Take by mouth.    [provider]  aspirin EC 325 MG tablet Take 1 tablet (325 mg total) by mouth daily. 07/10/15   Aldean Jewett, MD  atorvastatin (LIPITOR) 40 MG tablet Take 1 tablet (40 mg total) by mouth daily at 6 PM. 11/04/18   Alisa Graff, FNP  chlorhexidine (HIBICLENS) 4 % external liquid Use as body was daily in the shower 04/18/19   [provider]  famotidine (PEPCID) 20 MG tablet Take 1 tablet (20 mg total) by mouth 2 (two) times daily. 05/18/18   Carrie Mew, MD  gabapentin (NEURONTIN) 300 MG capsule Take 300 mg by mouth 2 (two) times daily.    [provider]  insulin detemir (LEVEMIR FLEXTOUCH) 100 UNIT/ML FlexPen Inject into the skin. 11/10/18   [provider]  insulin glargine (LANTUS) 100 UNIT/ML injection Inject into the skin. 12/07/15   [provider]  insulin starter kit- syringes MISC 1 kit by Other route once. 07/10/15   Aldean Jewett, MD  lidocaine (XYLOCAINE) 2 % solution Use as directed 15 mLs in the mouth or throat every 6 (six) hours as needed (stomach pain). 12/29/19   Nance Pear, MD  meloxicam (MOBIC) 15 MG tablet Take 1 tablet (15 mg total) by mouth daily. 01/26/20   [name], DPM  metoprolol succinate (TOPROL-XL) 25 MG 24 hr tablet Take by mouth. 10/06/19    [provider]  minocycline (MINOCIN) 100 MG capsule Take by mouth daily. 02/08/20   [provider]  omeprazole (PRILOSEC) 40 MG capsule Take by mouth. 03/17/19 03/16/20  [provider]  ondansetron (ZOFRAN ODT) 4 MG disintegrating tablet Take 1 tablet (4 mg total) by mouth every 8 (eight) hours as needed for nausea or vomiting. 01/06/20   Blake Divine, MD  oxyCODONE-acetaminophen (PERCOCET) 5-325 MG tablet Take 1 tablet by mouth every 4 (four) hours as needed for severe pain. 01/06/20 01/05/21  Blake Divine, MD  sacubitril-valsartan (ENTRESTO) 49-51 MG Take by mouth. 06/13/19   [provider]  spironolactone (ALDACTONE) 25 MG tablet Take by mouth. 09/19/16   [provider]  sucralfate (CARAFATE) 1 g tablet Take 1 tablet (1 g total) by mouth 4 (four) times daily. 12/29/19  Nance Pear, MD  Syringe, Disposable, 1 ML MISC 0.1 mLs by Does not apply route as directed. 08/21/15   Alisa Graff, FNP  tiotropium (SPIRIVA) 18 MCG inhalation capsule Place 18 mcg into inhaler and inhale daily.    [provider]  torsemide (DEMADEX) 20 MG tablet Take 1 tablet (20 mg total) by mouth daily. Patient taking differently: Take 40 mg by mouth daily.  11/04/18   Alisa Graff, FNP  ULTICARE MINI PEN NEEDLES 31G X 6 MM MISC  11/10/18   [provider]  umeclidinium-vilanterol (ANORO ELLIPTA) 62.5-25 MCG/INH AEPB Inhale into the lungs. 01/31/19   [provider]  VICTOZA 18 MG/3ML SOPN  10/17/19   [provider]    Physical Exam: Vitals:   02/27/20 0733 02/27/20 0737 02/27/20 0800 02/27/20 0915  BP:  (!) 153/109 (!) 143/108 (!) 142/99  Pulse:  89 81 74  Resp:  20 16 19   Temp:  98.3 F (36.8 C)    TempSrc:  Oral    SpO2:  96% 98% 97%  Weight: (!) 147.9 kg     Height: 5' 6"  (1.676 m)        Vitals:   02/27/20 0733 02/27/20 0737 02/27/20 0800 02/27/20 0915  BP:  (!) 153/109 (!) 143/108 (!) 142/99  Pulse:  89 81 74   Resp:  20 16 19   Temp:  98.3 F (36.8 C)    TempSrc:  Oral    SpO2:  96% 98% 97%  Weight: (!) 147.9 kg     Height: 5' 6"  (1.676 m)       Constitutional: NAD, alert and oriented x 3. Morbid Obesity Eyes: PERRL, lids and conjunctivae normal ENMT: Mucous membranes are moist.  Neck: normal, supple, no masses, no thyromegaly Respiratory: Crackles at the bases, scattered wheezing, Normal respiratory effort. No accessory muscle use.  Cardiovascular: Regular rate and rhythm, no murmurs / rubs / gallops. No extremity edema. 2+ pedal pulses. No carotid bruits.  Abdomen: no tenderness, no masses palpated. No hepatosplenomegaly. Bowel sounds positive. Central adiposity Musculoskeletal: no clubbing / cyanosis. No joint deformity upper and lower extremities.  Skin: no rashes, lesions, ulcers.  Neurologic: No gross focal neurologic deficit. Psychiatric: Normal mood and affect.   Labs on Admission: I have personally reviewed following labs and imaging studies  CBC: Recent Labs  Lab 02/27/20 0737  WBC 6.0  HGB 16.2  HCT 45.9  MCV 93.3  PLT 060   Basic Metabolic Panel: Recent Labs  Lab 02/27/20 0737  NA 136  K 4.1  CL 97*  CO2 28  GLUCOSE 229*  BUN 19  CREATININE 1.07  CALCIUM 8.7*   GFR: Estimated Creatinine Clearance: 121.4 mL/min (by C-G formula based on SCr of 1.07 mg/dL). Liver Function Tests: Recent Labs  Lab 02/27/20 0737  AST 47*  ALT 44  ALKPHOS 230*  BILITOT 0.8  PROT 8.0  ALBUMIN 3.6   Recent Labs  Lab 02/27/20 0737  LIPASE 46   No results for input(s): AMMONIA in the last 168 hours. Coagulation Profile: No results for input(s): INR, PROTIME in the last 168 hours. Cardiac Enzymes: No results for input(s): CKTOTAL, CKMB, CKMBINDEX, TROPONINI in the last 168 hours. BNP (last 3 results) No results for input(s): PROBNP in the last 8760 hours. HbA1C: No results for input(s): HGBA1C in the last 72 hours. CBG: No results for input(s): GLUCAP in the last  168 hours. Lipid Profile: No results for input(s): CHOL, HDL, LDLCALC, TRIG, CHOLHDL, LDLDIRECT  in the last 72 hours. Thyroid Function Tests: No results for input(s): TSH, T4TOTAL, FREET4, T3FREE, THYROIDAB in the last 72 hours. Anemia Panel: No results for input(s): VITAMINB12, FOLATE, FERRITIN, TIBC, IRON, RETICCTPCT in the last 72 hours. Urine analysis:    Component Value Date/Time   COLORURINE YELLOW (A) 01/06/2020 0923   APPEARANCEUR CLEAR (A) 01/06/2020 0923   APPEARANCEUR Hazy 08/21/2014 0800   LABSPEC 1.012 01/06/2020 0923   LABSPEC 1.015 08/21/2014 0800   PHURINE 5.0 01/06/2020 0923   GLUCOSEU NEGATIVE 01/06/2020 0923   GLUCOSEU Negative 08/21/2014 0800   HGBUR NEGATIVE 01/06/2020 0923   BILIRUBINUR NEGATIVE 01/06/2020 0923   BILIRUBINUR Negative 08/21/2014 0800   KETONESUR 5 (A) 01/06/2020 0923   PROTEINUR >=300 (A) 01/06/2020 0923   NITRITE NEGATIVE 01/06/2020 0923   LEUKOCYTESUR NEGATIVE 01/06/2020 0923   LEUKOCYTESUR 1+ 08/21/2014 0800    Radiological Exams on Admission: DG Chest 2 View  Result Date: 02/27/2020 CLINICAL DATA:  Chest pain. EXAM: CHEST - 2 VIEW COMPARISON:  May 18, 2017. FINDINGS: Mild cardiomegaly with central pulmonary vascular congestion. Left-sided pacemaker is unchanged in position. No pneumothorax or pleural effusion is noted. No consolidative process is noted. Bony thorax is unremarkable. IMPRESSION: Mild cardiomegaly with central pulmonary vascular congestion. Electronically Signed   By: Marijo Conception M.D.   On: 02/27/2020 08:05    EKG: Independently reviewed.  Sinus rhythm with PVCs and LVH  Assessment/Plan Principal Problem:   Acute combined systolic and diastolic heart failure (HCC) Active Problems:   NSTEMI (non-ST elevated myocardial infarction) ([place])   Hypertension   Chronic respiratory failure with hypercapnia (HCC)   Elevated troponin   Non-ischemic cardiomyopathy (HCC)   Diabetes ([place])   Acute on chronic combined  systolic (congestive) and diastolic (congestive) heart failure (HCC)   COPD (chronic obstructive pulmonary disease) (HCC)    Acute on chronic combined systolic and diastolic dysfunction CHF Last known LVEF of 20 - 25% (2019) Patient has a history of dilated cardiomyopathy and status post AICD insertion He presents for evaluation of chest pain/pressure associated with shortness of breath Chest x-ray shows mild cardiomegaly with central pulmonary vascular congestion. Obtain 2D echocardiogram to assess LVEF We will place patient on IV diuretics Continue Entresto, metoprolol and spironolactone    NSTEMI Patient with significant cardiac risk factors which include diabetes mellitus, and hypertension and morbid obesity  Patient has a bump in his troponin 99 >> 485 Currently chest pain-free Continue aspirin, heparin, beta-blockers and statins Consult cardiology   Diabetes mellitus Maintain consistent carbohydrate diet Sliding scale coverage with insulin   Morbid obesity (BMI 52) Complicates overall prognosis and care   COPD not acutely exacerbated Continue as needed bronchodilator therapy Continue inhaled steroids and Spiriva     DVT prophylaxis: Heparin Code Status: Full code Family Communication: Greater than 50% of time was spent discussing plan of care with patient at the bedside.  All questions and concerns have been addressed.  He verbalizes understanding and agrees with the plan. Disposition Plan: Back to previous home environment Consults called: None    Diondra Pines MD [facility]     02/27/2020, 10:26 AM

## 2020-02-28 DIAGNOSIS — I110 Hypertensive heart disease with heart failure: Secondary | ICD-10-CM | POA: Diagnosis not present

## 2020-02-28 LAB — CBC
HCT: 46 % (ref 39.0–52.0)
Hemoglobin: 15.9 g/dL (ref 13.0–17.0)
MCH: 33 pg (ref 26.0–34.0)
MCHC: 34.6 g/dL (ref 30.0–36.0)
MCV: 95.4 fL (ref 80.0–100.0)
Platelets: 154 10*3/uL (ref 150–400)
RBC: 4.82 MIL/uL (ref 4.22–5.81)
RDW: 12.2 % (ref 11.5–15.5)
WBC: 6.2 10*3/uL (ref 4.0–10.5)
nRBC: 0 % (ref 0.0–0.2)

## 2020-02-28 LAB — BASIC METABOLIC PANEL
Anion gap: 10 (ref 5–15)
BUN: 19 mg/dL (ref 6–20)
CO2: 31 mmol/L (ref 22–32)
Calcium: 8.4 mg/dL — ABNORMAL LOW (ref 8.9–10.3)
Chloride: 94 mmol/L — ABNORMAL LOW (ref 98–111)
Creatinine, Ser: 1.3 mg/dL — ABNORMAL HIGH (ref 0.61–1.24)
GFR calc Af Amer: >60 mL/min (ref >60)
GFR calc non Af Amer: >60 mL/min (ref >60)
Glucose, Bld: 203 mg/dL — ABNORMAL HIGH (ref 70–99)
Potassium: 4.4 mmol/L (ref 3.5–5.1)
Sodium: 135 mmol/L (ref 135–145)

## 2020-02-28 LAB — HEPARIN LEVEL (UNFRACTIONATED)
Heparin Unfractionated: 0.44 IU/mL (ref 0.30–0.70)
Heparin Unfractionated: 0.51 IU/mL (ref 0.30–0.70)

## 2020-02-28 LAB — GLUCOSE, CAPILLARY: Glucose-Capillary: 232 mg/dL — ABNORMAL HIGH (ref 70–99)

## 2020-02-28 SURGERY — LEFT HEART CATH AND CORONARY ANGIOGRAPHY
Anesthesia: Moderate Sedation

## 2020-02-28 MED ORDER — CLOPIDOGREL BISULFATE 75 MG PO TABS
75.0000 mg | ORAL_TABLET | Freq: Every day | ORAL | 11 refills | Status: DC
Start: 2020-02-28 — End: 2020-11-27

## 2020-02-28 NOTE — Discharge Summary (Signed)
44 year old gentleman with extensive cardiac history, ejection fraction 20% status post AICD, coronary artery disease, diabetes, sleep apnea presented to emergency room with chest pain.  He was started on heparin infusion and admitted to hospital with cardiology consult.  He was medically stabilizing.  There was plan for cardiac catheterization.  Early morning today, before this provider went to see the patient he wanted to go home [place].  He did not wait for this provider.  Apparently, he did talk to cardiology provider who recommended to stay in the hospital and have cardiac catheterization, however he left.  He was given clear advice about when to come back to emergency room, look for any worsening symptoms and was advised to continue his medications including Entresto, metoprolol, spironolactone, amlodipine, aspirin and atorvastatin.  Plavix was added by cardiology.  Left AGAINST MEDICAL ADVICE.

## 2020-02-28 NOTE — Progress Notes (Signed)
[facility] Cardiology    SUBJECTIVE: The patient denies recurrent chest pain. He reports that his breathing is "fine." He wants to leave AMA this morning and does not want to undergo the cardiac catheterization this morning.    Vitals:   02/27/20 2026 02/27/20 2116 02/28/20 0502 02/28/20 0802  BP: (!) 136/97  114/77 (!) 141/105  Pulse: 72  75 88  Resp: 20 16 20 19   Temp: 98 F (36.7 C)  98.2 F (36.8 C) 98.2 F (36.8 C)  TempSrc: Oral  Oral   SpO2: 98% 98% 98% 94%  Weight: (!) 142.2 kg  (!) 139.8 kg   Height: 5\' 6"  (1.676 m)        Intake/Output Summary (Last 24 hours) at 02/28/2020 0817 Last data filed at 02/28/2020 0947 Gross per 24 hour  Intake 493.75 ml  Output 2100 ml  Net -1606.25 ml      PHYSICAL EXAM  General: Well developed, well nourished, in no acute distress, sitting on side of bed HEENT:  Normocephalic and atramatic Neck:  No JVD.  Lungs: normal effort of breathing on room air. Diminished breath sound in bilateral bases, no crackles Heart: HRRR . Normal S1 and S2 without gallops or murmurs.  Abdomen: nondistended Msk:  Back normal, gait not assessed. No obvious deformities Extremities: No clubbing, cyanosis or edema.   Neuro: Alert and oriented X 3. Psych:  Good affect, responds appropriately   LABS: Basic Metabolic Panel: Recent Labs    02/27/20 0737 02/28/20 0156  NA 136 135  K 4.1 4.4  CL 97* 94*  CO2 28 31  GLUCOSE 229* 203*  BUN 19 19  CREATININE 1.07 1.30*  CALCIUM 8.7* 8.4*   Liver Function Tests: Recent Labs    02/27/20 0737  AST 47*  ALT 44  ALKPHOS 230*  BILITOT 0.8  PROT 8.0  ALBUMIN 3.6   Recent Labs    02/27/20 0737  LIPASE 46   CBC: Recent Labs    02/27/20 0737 02/28/20 0156  WBC 6.0 6.2  HGB 16.2 15.9  HCT 45.9 46.0  MCV 93.3 95.4  PLT 161 154   Cardiac Enzymes: No results for input(s): CKTOTAL, CKMB, CKMBINDEX, TROPONINI in the last 72 hours. BNP: Invalid input(s): POCBNP D-Dimer: No results for input(s):  DDIMER in the last 72 hours. Hemoglobin A1C: Recent Labs    02/27/20 1201  HGBA1C 8.5*   Fasting Lipid Panel: No results for input(s): CHOL, HDL, LDLCALC, TRIG, CHOLHDL, LDLDIRECT in the last 72 hours. Thyroid Function Tests: No results for input(s): TSH, T4TOTAL, T3FREE, THYROIDAB in the last 72 hours.  Invalid input(s): FREET3 Anemia Panel: No results for input(s): VITAMINB12, FOLATE, FERRITIN, TIBC, IRON, RETICCTPCT in the last 72 hours.  DG Chest 2 View  Result Date: 02/27/2020 CLINICAL DATA:  Chest pain. EXAM: CHEST - 2 VIEW COMPARISON:  May 18, 2017. FINDINGS: Mild cardiomegaly with central pulmonary vascular congestion. Left-sided pacemaker is unchanged in position. No pneumothorax or pleural effusion is noted. No consolidative process is noted. Bony thorax is unremarkable. IMPRESSION: Mild cardiomegaly with central pulmonary vascular congestion. Electronically Signed   By: Marijo Conception M.D.   On: 02/27/2020 08:05   ECHOCARDIOGRAM COMPLETE  Result Date: 02/27/2020    ECHOCARDIOGRAM REPORT   Patient Name:   Oscar [name] Date of Exam: 02/27/2020 Medical Rec #:  [id number]        Height:       66.0 in Accession #:    [id number]  Weight:       310.0 lb Date of Birth:  Aug 01, 1976       BSA:          2.409 m Patient Age:    71 years         BP:           125/76 mmHg Patient Gender: M                HR:           61 bpm. Exam Location:  [facility] Procedure: 2D Echo, Cardiac Doppler and Color Doppler Indications:     CHF 428.0  History:         Patient has prior history of Echocardiogram examinations, most                  recent 06/23/2018. CHF, Previous Myocardial Infarction; Risk                  Factors:Hypertension and Diabetes. AICD, dilated                  cardiomyopathy.  Sonographer:     Sherrie Sport RDCS (AE) Referring Phys:  [id number] Royce Macadamia AGBATA Diagnosing Phys: Isaias Cowman MD IMPRESSIONS  1. Left ventricular ejection fraction, by estimation, is <20%. The left  ventricle has severely decreased function. The left ventricle has no regional wall motion abnormalities. The left ventricular internal cavity size was severely dilated. Left ventricular diastolic parameters were normal.  2. Right ventricular systolic function is normal. The right ventricular size is normal. There is normal pulmonary artery systolic pressure.  3. The mitral valve is normal in structure. Mild mitral valve regurgitation. No evidence of mitral stenosis.  4. The aortic valve is normal in structure. Aortic valve regurgitation is not visualized. No aortic stenosis is present.  5. The inferior vena cava is normal in size with greater than 50% respiratory variability, suggesting right atrial pressure of 3 mmHg. FINDINGS  Left Ventricle: Left ventricular ejection fraction, by estimation, is <20%. The left ventricle has severely decreased function. The left ventricle has no regional wall motion abnormalities. The left ventricular internal cavity size was severely dilated.  There is no left ventricular hypertrophy. Left ventricular diastolic parameters were normal. Right Ventricle: The right ventricular size is normal. No increase in right ventricular wall thickness. Right ventricular systolic function is normal. There is normal pulmonary artery systolic pressure. The tricuspid regurgitant velocity is 1.15 m/s, and  with an assumed right atrial pressure of 10 mmHg, the estimated right ventricular systolic pressure is 24.0 mmHg. Left Atrium: Left atrial size was normal in size. Right Atrium: Right atrial size was normal in size. Pericardium: There is no evidence of pericardial effusion. Mitral Valve: The mitral valve is normal in structure. Normal mobility of the mitral valve leaflets. Mild mitral valve regurgitation. No evidence of mitral valve stenosis. Tricuspid Valve: The tricuspid valve is normal in structure. Tricuspid valve regurgitation is mild . No evidence of tricuspid stenosis. Aortic Valve: The  aortic valve is normal in structure. Aortic valve regurgitation is not visualized. No aortic stenosis is present. Aortic valve mean gradient measures 1.3 mmHg. Aortic valve peak gradient measures 2.2 mmHg. Aortic valve area, by VTI measures 2.02 cm. Pulmonic Valve: The pulmonic valve was normal in structure. Pulmonic valve regurgitation is not visualized. No evidence of pulmonic stenosis. Aorta: The aortic root is normal in size and structure. Venous: The inferior vena cava is normal in size with greater than  50% respiratory variability, suggesting right atrial pressure of 3 mmHg. IAS/Shunts: No atrial level shunt detected by color flow Doppler.  LEFT VENTRICLE PLAX 2D LVIDd:         7.31 cm      Diastology LVIDs:         6.84 cm      LV e' lateral:   2.83 cm/s LV PW:         1.69 cm      LV E/e' lateral: 17.7 LV IVS:        1.91 cm      LV e' medial:    3.70 cm/s LVOT diam:     2.30 cm      LV E/e' medial:  13.5 LV SV:         22 LV SV Index:   9 LVOT Area:     4.15 cm  LV Volumes (MOD) LV vol d, MOD A2C: 630.0 ml LV vol d, MOD A4C: 497.0 ml LV vol s, MOD A2C: 544.0 ml LV vol s, MOD A4C: 417.0 ml LV SV MOD A2C:     86.0 ml LV SV MOD A4C:     497.0 ml LV SV MOD BP:      75.4 ml RIGHT VENTRICLE RV Basal diam:  5.43 cm TAPSE (M-mode): 3.5 cm LEFT ATRIUM              Index       RIGHT ATRIUM           Index LA diam:        4.90 cm  2.03 cm/m  RA Area:     21.90 cm LA Vol (A2C):   76.3 ml  31.67 ml/m RA Volume:   63.80 ml  26.48 ml/m LA Vol (A4C):   123.0 ml 51.05 ml/m LA Biplane Vol: 94.7 ml  39.31 ml/m  AORTIC VALVE AV Area (Vmax):    1.81 cm AV Area (Vmean):   2.00 cm AV Area (VTI):     2.02 cm AV Vmax:           74.63 cm/s AV Vmean:          47.767 cm/s AV VTI:            0.108 m AV Peak Grad:      2.2 mmHg AV Mean Grad:      1.3 mmHg LVOT Vmax:         32.50 cm/s LVOT Vmean:        23.000 cm/s LVOT VTI:          0.053 m LVOT/AV VTI ratio: 0.49  AORTA Ao Root diam: 4.10 cm MITRAL VALVE                TRICUSPID VALVE MV Area (PHT): 3.68 cm    TR Peak grad:   5.3 mmHg MV Decel Time: 206 msec    TR Vmax:        115.00 cm/s MV E velocity: 50.00 cm/s MV A velocity: 68.90 cm/s  SHUNTS MV E/A ratio:  0.73        Systemic VTI:  0.05 m                            Systemic Diam: 2.30 cm Isaias Cowman MD Electronically signed by Isaias Cowman MD Signature Date/Time: 02/27/2020/4:50:07 PM    Final      Echo LVEf <20%, no regional wall motion  abnormalities, mild MR  TELEMETRY: sinus rhythm  ASSESSMENT AND PLAN:  Principal Problem:   Acute combined systolic and diastolic heart failure (HCC) Active Problems:   NSTEMI (non-ST elevated myocardial infarction) (HCC)   Hypertension   Chronic respiratory failure with hypercapnia (HCC)   Elevated troponin   Non-ischemic cardiomyopathy (HCC)   Diabetes (HCC)   Acute on chronic combined systolic (congestive) and diastolic (congestive) heart failure (HCC)   COPD (chronic obstructive pulmonary disease) ([place])    1. NSTEMI, presenting with centralized, twisting chest pain that occurred while lying in bed, with elevated high sensitivity troponin (99, 485, 6346), with ECG revealing sinus rhythm with occasional PVCs with chronic LBBB and nonspecific T wave abnormalities, largely unchanged from previous ECG. Chest pain resolved after morphine. The patient has normal coronary anatomy per cardiac catheterization in 2014 and 2016. Nuclear stress test in 01/2019 revealed no evidence of ischemia, with persistent inferior scar versus artifact with LVEF 11%. The patient agreed to cardiac catheterization initially, but declines this morning, and would like to leave AMA despite a lengthy discussion about the situation. He denies recurrent chest pain. 2. Nonischemic cardiomyopathy with LVEF 10-20%, status post dual chamber ICD 3. Chronic systolic CHF, appears better with diuresis, BNP elevated to >300, receiving IV Lasix. On Entresto, metoprolol succinate,  spironolactone,  4. Hypertension 5. Type II diabetes, on insulin  Recommendations: 1. Discontinue heparin per patient request 2. Defer cardiac catheterization per patient request 3. Continue Entresto, metoprolol succinate, spironolactone, amlodipine, aspirin, atorvastatin 4. Start Plavix 75 mg daily in addition to aspirin 81 mg daily 5. Follow up with Dr. [name] next week 6. Strict ER precautions discussed with the patient    Clabe Seal, Hershal Coria 02/28/2020 8:17 AM

## 2020-02-28 NOTE — Consult Note (Signed)
[place] for Heparin Indication: chest pain/ACS  Allergies  Allergen Reactions  . Bidil [Isosorb Dinitrate-Hydralazine] Other (See Comments)    Migraine Headache Migraine Headache  . Ciprofloxacin     Patient Measurements: Height: 5\' 6"  (167.6 cm) Weight: (!) 139.8 kg (308 lb 3.2 oz) IBW/kg (Calculated) : 63.8 Heparin Dosing Weight: 100.2 kg  Vital Signs: Temp: 98.2 F (36.8 C) (06/29 0802) Temp Source: Oral (06/29 0502) BP: 141/105 (06/29 0802) Pulse Rate: 88 (06/29 0802)  Labs: Recent Labs    02/27/20 0737 02/27/20 0913 02/27/20 1201 02/27/20 1843 02/27/20 1849 02/28/20 0156 02/28/20 0807  HGB 16.2  --   --   --   --  15.9  --   HCT 45.9  --   --   --   --  46.0  --   PLT 161  --   --   --   --  154  --   APTT  --   --   --  >160*  --   --   --   HEPARINUNFRC  --   --   --   --  1.59* 0.44 0.51  CREATININE 1.07  --   --   --   --  1.30*  --   TROPONINIHS 99* 485* 6,346*  --   --   --   --     Estimated Creatinine Clearance: 96.6 mL/min (A) (by C-G formula based on SCr of 1.3 mg/dL (H)).   Medical History: Past Medical History:  Diagnosis Date  . AICD (automatic cardioverter/defibrillator) present   . Asthma   . Cardiomyopathy ([place])   . CHF (congestive heart failure) ([place])   . Coronary artery disease   . Deafness in right ear   . Diabetes mellitus without complication ([place])   . Dilated cardiomyopathy ([place])   . Dysrhythmia    svt  . 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 GERD (gastroesophageal reflux disease)   . Hyperlipidemia   . Hypertension   . Hypoxemia   . Hypoxemia   . Mild obesity   . Myocardial infarction ([place])    [id number],4/74  . Pancreatitis   . Sleep apnea    osa    Medications:  Medications Prior to Admission  Medication Sig Dispense Refill Last Dose  . aspirin EC 325 MG tablet Take 1 tablet (325 mg total) by mouth daily. 30 tablet 0 02/27/2020 at Unknown time  . atorvastatin  (LIPITOR) 40 MG tablet Take 1 tablet (40 mg total) by mouth daily at 6 PM. 90 tablet 3 02/26/2020 at Unknown time  . famotidine (PEPCID) 20 MG tablet Take 1 tablet (20 mg total) by mouth 2 (two) times daily. 60 tablet 0 02/27/2020 at Unknown time  . gabapentin (NEURONTIN) 300 MG capsule Take 300 mg by mouth 2 (two) times daily.   02/27/2020 at Unknown time  . insulin detemir (LEVEMIR FLEXTOUCH) 100 UNIT/ML FlexPen Inject 30 Units into the skin 2 (two) times daily.    02/27/2020 at Unknown time  . liraglutide (VICTOZA) 18 MG/3ML SOPN Inject 18 mg into the skin daily in the afternoon.   02/27/2020 at Unknown time  . sacubitril-valsartan (ENTRESTO) 49-51 MG Take 1 tablet by mouth 2 (two) times daily.    02/27/2020 at Unknown time  . tiotropium (SPIRIVA) 18 MCG inhalation capsule Place 18 mcg into inhaler and inhale daily.   02/27/2020 at Unknown time  . torsemide (DEMADEX) 20 MG tablet  Take 1 tablet (20 mg total) by mouth daily. (Patient taking differently: Take 40 mg by mouth daily. ) 90 tablet 3 02/27/2020 at Unknown time  . albuterol (VENTOLIN HFA) 108 (90 Base) MCG/ACT inhaler 2 inhalations every 6 (six) hours as needed   PRN at PRN  . aluminum-magnesium hydroxide-simethicone (MAALOX) 664-403-47 MG/5ML SUSP Take 30 mLs by mouth 4 (four) times daily -  before meals and at bedtime. 355 mL 0 PRN at PRN  . clindamycin (CLEOCIN T) 1 % external solution Apply 1 application topically 2 (two) times daily.    PRN at PRN  . lidocaine (XYLOCAINE) 2 % solution Use as directed 15 mLs in the mouth or throat every 6 (six) hours as needed (stomach pain). 100 mL 0 PRN at PRN  . meloxicam (MOBIC) 15 MG tablet Take 1 tablet (15 mg total) by mouth daily. (Patient not taking: Reported on 02/27/2020) 30 tablet 0 Not Taking at Unknown time  . ondansetron (ZOFRAN ODT) 4 MG disintegrating tablet Take 1 tablet (4 mg total) by mouth every 8 (eight) hours as needed for nausea or vomiting. (Patient not taking: Reported on 02/27/2020) 12  tablet 0 Not Taking at Unknown time  . oxyCODONE-acetaminophen (PERCOCET) 5-325 MG tablet Take 1 tablet by mouth every 4 (four) hours as needed for severe pain. (Patient not taking: Reported on 02/27/2020) 15 tablet 0 Not Taking at Unknown time  . sucralfate (CARAFATE) 1 g tablet Take 1 tablet (1 g total) by mouth 4 (four) times daily. (Patient not taking: Reported on 02/27/2020) 60 tablet 0 Not Taking at Unknown time  . umeclidinium-vilanterol (ANORO ELLIPTA) 62.5-25 MCG/INH AEPB Inhale 1 puff into the lungs daily in the afternoon.    PRN at PRN   Scheduled:  . allopurinol  100 mg Oral Daily  . alum & mag hydroxide-simeth  30 mL Oral TID AC & HS  . amLODipine  5 mg Oral Daily  . aspirin EC  325 mg Oral Daily  . atorvastatin  40 mg Oral Daily  . famotidine  20 mg Oral BID  . gabapentin  300 mg Oral BID  . insulin aspart  0-20 Units Subcutaneous TID WC  . metoprolol succinate  25 mg Oral Daily  . sacubitril-valsartan  1 tablet Oral BID  . sodium chloride flush  3 mL Intravenous Q12H  . sodium chloride flush  3 mL Intravenous Q12H  . spironolactone  25 mg Oral Daily  . sucralfate  1 g Oral QID  . tiotropium  18 mcg Inhalation Daily  . torsemide  40 mg Oral Daily  . umeclidinium-vilanterol  1 puff Inhalation Daily   Infusions:  . sodium chloride    . sodium chloride    . sodium chloride    . heparin 1,200 Units/hr (02/27/20 2052)   PRN: sodium chloride, sodium chloride, acetaminophen, ipratropium-albuterol, morphine injection, ondansetron (ZOFRAN) IV, sodium chloride flush, sodium chloride flush Anti-infectives (From admission, onward)   None      Assessment: Pharmacy consulted to start heparin for ACS. No DOAC noted PTA.   Started with 4000 units bolus, followed by heparin infusion at 1400 units/hr  06/28 @ 1849 HL 1.59 supratherapeutic - dose held, then reduced to 1200 units/hr 06/29 @ 0200 HL 0.44 therapeutic x 1 06/29 @ 0807 HL 0.51 therapeutic x 2  Goal of Therapy:   Heparin level 0.3-0.7 units/ml Monitor platelets by anticoagulation protocol: Yes   Plan:  06/29 @ 0807 HL 0.51 therapeutic.   Will continue to monitor and  will recheck HL/CBC daily per protocol.  Lu Duffel, PharmD, BCPS Clinical Pharmacist 02/28/2020 9:17 AM

## 2020-02-28 NOTE — Consult Note (Signed)
[place] for Heparin Indication: chest pain/ACS  Allergies  Allergen Reactions  . Bidil [Isosorb Dinitrate-Hydralazine] Other (See Comments)    Migraine Headache Migraine Headache  . Ciprofloxacin     Patient Measurements: Height: 5\' 6"  (167.6 cm) Weight: (!) 142.2 kg (313 lb 9.6 oz) IBW/kg (Calculated) : 63.8 Heparin Dosing Weight: 100.2 kg  Vital Signs: Temp: 98 F (36.7 C) (06/28 2026) Temp Source: Oral (06/28 2026) BP: 136/97 (06/28 2026) Pulse Rate: 72 (06/28 2026)  Labs: Recent Labs    02/27/20 0737 02/27/20 0913 02/27/20 1201 02/27/20 1843 02/27/20 1849 02/28/20 0156  HGB 16.2  --   --   --   --  15.9  HCT 45.9  --   --   --   --  46.0  PLT 161  --   --   --   --  154  APTT  --   --   --  >160*  --   --   HEPARINUNFRC  --   --   --   --  1.59* 0.44  CREATININE 1.07  --   --   --   --  1.30*  TROPONINIHS 99* 485* 6,346*  --   --   --     Estimated Creatinine Clearance: 97.6 mL/min (A) (by C-G formula based on SCr of 1.3 mg/dL (H)).   Medical History: Past Medical History:  Diagnosis Date  . AICD (automatic cardioverter/defibrillator) present   . Asthma   . Cardiomyopathy ([place])   . CHF (congestive heart failure) ([place])   . Coronary artery disease   . Deafness in right ear   . Diabetes mellitus without complication ([place])   . Dilated cardiomyopathy ([place])   . Dysrhythmia    svt  . 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 GERD (gastroesophageal reflux disease)   . Hyperlipidemia   . Hypertension   . Hypoxemia   . Hypoxemia   . Mild obesity   . Myocardial infarction ([place])    6378,5885,0/27  . Pancreatitis   . Sleep apnea    osa    Medications:  Medications Prior to Admission  Medication Sig Dispense Refill Last Dose  . aspirin EC 325 MG tablet Take 1 tablet (325 mg total) by mouth daily. 30 tablet 0 02/27/2020 at Unknown time  . atorvastatin (LIPITOR) 40 MG tablet Take 1 tablet (40 mg total) by  mouth daily at 6 PM. 90 tablet 3 02/26/2020 at Unknown time  . famotidine (PEPCID) 20 MG tablet Take 1 tablet (20 mg total) by mouth 2 (two) times daily. 60 tablet 0 02/27/2020 at Unknown time  . gabapentin (NEURONTIN) 300 MG capsule Take 300 mg by mouth 2 (two) times daily.   02/27/2020 at Unknown time  . insulin detemir (LEVEMIR FLEXTOUCH) 100 UNIT/ML FlexPen Inject 30 Units into the skin 2 (two) times daily.    02/27/2020 at Unknown time  . liraglutide (VICTOZA) 18 MG/3ML SOPN Inject 18 mg into the skin daily in the afternoon.   02/27/2020 at Unknown time  . sacubitril-valsartan (ENTRESTO) 49-51 MG Take 1 tablet by mouth 2 (two) times daily.    02/27/2020 at Unknown time  . tiotropium (SPIRIVA) 18 MCG inhalation capsule Place 18 mcg into inhaler and inhale daily.   02/27/2020 at Unknown time  . torsemide (DEMADEX) 20 MG tablet Take 1 tablet (20 mg total) by mouth daily. (Patient taking differently: Take 40 mg by mouth daily. ) 90 tablet  3 02/27/2020 at Unknown time  . albuterol (VENTOLIN HFA) 108 (90 Base) MCG/ACT inhaler 2 inhalations every 6 (six) hours as needed   PRN at PRN  . aluminum-magnesium hydroxide-simethicone (MAALOX) 254-270-62 MG/5ML SUSP Take 30 mLs by mouth 4 (four) times daily -  before meals and at bedtime. 355 mL 0 PRN at PRN  . clindamycin (CLEOCIN T) 1 % external solution Apply 1 application topically 2 (two) times daily.    PRN at PRN  . lidocaine (XYLOCAINE) 2 % solution Use as directed 15 mLs in the mouth or throat every 6 (six) hours as needed (stomach pain). 100 mL 0 PRN at PRN  . meloxicam (MOBIC) 15 MG tablet Take 1 tablet (15 mg total) by mouth daily. (Patient not taking: Reported on 02/27/2020) 30 tablet 0 Not Taking at Unknown time  . ondansetron (ZOFRAN ODT) 4 MG disintegrating tablet Take 1 tablet (4 mg total) by mouth every 8 (eight) hours as needed for nausea or vomiting. (Patient not taking: Reported on 02/27/2020) 12 tablet 0 Not Taking at Unknown time  .  oxyCODONE-acetaminophen (PERCOCET) 5-325 MG tablet Take 1 tablet by mouth every 4 (four) hours as needed for severe pain. (Patient not taking: Reported on 02/27/2020) 15 tablet 0 Not Taking at Unknown time  . sucralfate (CARAFATE) 1 g tablet Take 1 tablet (1 g total) by mouth 4 (four) times daily. (Patient not taking: Reported on 02/27/2020) 60 tablet 0 Not Taking at Unknown time  . umeclidinium-vilanterol (ANORO ELLIPTA) 62.5-25 MCG/INH AEPB Inhale 1 puff into the lungs daily in the afternoon.    PRN at PRN   Scheduled:  . allopurinol  100 mg Oral Daily  . alum & mag hydroxide-simeth  30 mL Oral TID AC & HS  . amLODipine  5 mg Oral Daily  . aspirin  81 mg Oral Pre-Cath  . aspirin EC  325 mg Oral Daily  . atorvastatin  40 mg Oral Daily  . famotidine  20 mg Oral BID  . gabapentin  300 mg Oral BID  . insulin aspart  0-20 Units Subcutaneous TID WC  . metoprolol succinate  25 mg Oral Daily  . sacubitril-valsartan  1 tablet Oral BID  . sodium chloride flush  3 mL Intravenous Q12H  . sodium chloride flush  3 mL Intravenous Q12H  . spironolactone  25 mg Oral Daily  . sucralfate  1 g Oral QID  . tiotropium  18 mcg Inhalation Daily  . torsemide  40 mg Oral Daily  . umeclidinium-vilanterol  1 puff Inhalation Daily   Infusions:  . sodium chloride    . sodium chloride    . sodium chloride    . heparin 1,200 Units/hr (02/27/20 2052)   PRN: sodium chloride, sodium chloride, acetaminophen, ipratropium-albuterol, morphine injection, ondansetron (ZOFRAN) IV, sodium chloride flush, sodium chloride flush Anti-infectives (From admission, onward)   None      Assessment: Pharmacy consulted to start heparin for ACS. No DOAC noted PTA.   Give 4000 units bolus x 1 Start heparin infusion at 1400 units/hr  Goal of Therapy:  Heparin level 0.3-0.7 units/ml Monitor platelets by anticoagulation protocol: Yes   Plan:  06/29 @ 0200 HL 0.44 therapeutic. Will continue to monitor and will recheck HL at  0800, CBC stable will continue to monitor.  Tobie Lords, PharmD, BCPS 02/28/2020,3:29 AM

## 2020-03-02 ENCOUNTER — Ambulatory Visit: Admit: 2020-03-02 | Payer: Medicare Other | Admitting: Cardiology

## 2020-03-28 ENCOUNTER — Emergency Department: Payer: Medicare Other

## 2020-03-28 ENCOUNTER — Other Ambulatory Visit: Payer: Self-pay

## 2020-03-28 ENCOUNTER — Inpatient Hospital Stay
Admission: EM | Admit: 2020-03-28 | Discharge: 2020-03-31 | DRG: 392 | Disposition: A | Discharge status: Home / Self Care | Payer: Medicare Other | Attending: Internal Medicine | Admitting: Internal Medicine

## 2020-03-28 ENCOUNTER — Encounter: Payer: Self-pay | Admitting: Emergency Medicine

## 2020-03-28 DIAGNOSIS — F17210 Nicotine dependence, cigarettes, uncomplicated: Secondary | ICD-10-CM | POA: Diagnosis present

## 2020-03-28 DIAGNOSIS — I5022 Chronic systolic (congestive) heart failure: Secondary | ICD-10-CM | POA: Diagnosis not present

## 2020-03-28 DIAGNOSIS — Z9114 Patient's other noncompliance with medication regimen: Secondary | ICD-10-CM

## 2020-03-28 DIAGNOSIS — K861 Other chronic pancreatitis: Secondary | ICD-10-CM | POA: Diagnosis present

## 2020-03-28 DIAGNOSIS — I420 Dilated cardiomyopathy: Secondary | ICD-10-CM | POA: Diagnosis present

## 2020-03-28 DIAGNOSIS — I252 Old myocardial infarction: Secondary | ICD-10-CM | POA: Diagnosis not present

## 2020-03-28 DIAGNOSIS — I110 Hypertensive heart disease with heart failure: Secondary | ICD-10-CM | POA: Diagnosis present

## 2020-03-28 DIAGNOSIS — E119 Type 2 diabetes mellitus without complications: Secondary | ICD-10-CM | POA: Diagnosis not present

## 2020-03-28 DIAGNOSIS — K7460 Unspecified cirrhosis of liver: Secondary | ICD-10-CM | POA: Diagnosis present

## 2020-03-28 DIAGNOSIS — K2900 Acute gastritis without bleeding: Secondary | ICD-10-CM | POA: Diagnosis present

## 2020-03-28 DIAGNOSIS — H9191 Unspecified hearing loss, right ear: Secondary | ICD-10-CM | POA: Diagnosis present

## 2020-03-28 DIAGNOSIS — G4733 Obstructive sleep apnea (adult) (pediatric): Secondary | ICD-10-CM | POA: Diagnosis present

## 2020-03-28 DIAGNOSIS — I10 Essential (primary) hypertension: Secondary | ICD-10-CM | POA: Diagnosis not present

## 2020-03-28 DIAGNOSIS — Z6841 Body Mass Index (BMI) 40.0 and over, adult: Secondary | ICD-10-CM

## 2020-03-28 DIAGNOSIS — N179 Acute kidney failure, unspecified: Secondary | ICD-10-CM | POA: Diagnosis present

## 2020-03-28 DIAGNOSIS — Z79899 Other long term (current) drug therapy: Secondary | ICD-10-CM | POA: Diagnosis not present

## 2020-03-28 DIAGNOSIS — K219 Gastro-esophageal reflux disease without esophagitis: Principal | ICD-10-CM | POA: Diagnosis present

## 2020-03-28 DIAGNOSIS — Z720 Tobacco use: Secondary | ICD-10-CM | POA: Diagnosis not present

## 2020-03-28 DIAGNOSIS — I2510 Atherosclerotic heart disease of native coronary artery without angina pectoris: Secondary | ICD-10-CM | POA: Diagnosis present

## 2020-03-28 DIAGNOSIS — J449 Chronic obstructive pulmonary disease, unspecified: Secondary | ICD-10-CM | POA: Diagnosis present

## 2020-03-28 DIAGNOSIS — I255 Ischemic cardiomyopathy: Secondary | ICD-10-CM | POA: Diagnosis present

## 2020-03-28 DIAGNOSIS — Z20822 Contact with and (suspected) exposure to covid-19: Secondary | ICD-10-CM | POA: Diagnosis present

## 2020-03-28 DIAGNOSIS — Z95810 Presence of automatic (implantable) cardiac defibrillator: Secondary | ICD-10-CM | POA: Diagnosis not present

## 2020-03-28 DIAGNOSIS — I471 Supraventricular tachycardia: Secondary | ICD-10-CM | POA: Diagnosis present

## 2020-03-28 DIAGNOSIS — Z8249 Family history of ischemic heart disease and other diseases of the circulatory system: Secondary | ICD-10-CM

## 2020-03-28 DIAGNOSIS — E785 Hyperlipidemia, unspecified: Secondary | ICD-10-CM | POA: Diagnosis present

## 2020-03-28 DIAGNOSIS — Z888 Allergy status to other drugs, medicaments and biological substances status: Secondary | ICD-10-CM

## 2020-03-28 DIAGNOSIS — I5042 Chronic combined systolic (congestive) and diastolic (congestive) heart failure: Secondary | ICD-10-CM | POA: Diagnosis present

## 2020-03-28 DIAGNOSIS — Z7902 Long term (current) use of antithrombotics/antiplatelets: Secondary | ICD-10-CM | POA: Diagnosis not present

## 2020-03-28 DIAGNOSIS — Z794 Long term (current) use of insulin: Secondary | ICD-10-CM

## 2020-03-28 DIAGNOSIS — Z825 Family history of asthma and other chronic lower respiratory diseases: Secondary | ICD-10-CM

## 2020-03-28 DIAGNOSIS — R109 Unspecified abdominal pain: Secondary | ICD-10-CM

## 2020-03-28 DIAGNOSIS — J441 Chronic obstructive pulmonary disease with (acute) exacerbation: Secondary | ICD-10-CM | POA: Diagnosis present

## 2020-03-28 DIAGNOSIS — Z833 Family history of diabetes mellitus: Secondary | ICD-10-CM

## 2020-03-28 DIAGNOSIS — R0789 Other chest pain: Secondary | ICD-10-CM | POA: Diagnosis present

## 2020-03-28 DIAGNOSIS — Z8379 Family history of other diseases of the digestive system: Secondary | ICD-10-CM

## 2020-03-28 DIAGNOSIS — R1013 Epigastric pain: Secondary | ICD-10-CM | POA: Diagnosis not present

## 2020-03-28 LAB — BASIC METABOLIC PANEL
Anion gap: 11 (ref 5–15)
BUN: 33 mg/dL — ABNORMAL HIGH (ref 6–20)
CO2: 28 mmol/L (ref 22–32)
Calcium: 8.9 mg/dL (ref 8.9–10.3)
Chloride: 94 mmol/L — ABNORMAL LOW (ref 98–111)
Creatinine, Ser: 2.17 mg/dL — ABNORMAL HIGH (ref 0.61–1.24)
GFR calc Af Amer: 41 mL/min — ABNORMAL LOW (ref >60)
GFR calc non Af Amer: 36 mL/min — ABNORMAL LOW (ref >60)
Glucose, Bld: 161 mg/dL — ABNORMAL HIGH (ref 70–99)
Potassium: 4.3 mmol/L (ref 3.5–5.1)
Sodium: 133 mmol/L — ABNORMAL LOW (ref 135–145)

## 2020-03-28 LAB — TROPONIN I (HIGH SENSITIVITY)
Troponin I (High Sensitivity): 79 ng/L — ABNORMAL HIGH (ref <18)
Troponin I (High Sensitivity): 65 ng/L — ABNORMAL HIGH (ref <18)

## 2020-03-28 LAB — CBC
HCT: 46.3 % (ref 39.0–52.0)
Hemoglobin: 16.7 g/dL (ref 13.0–17.0)
MCH: 33.1 pg (ref 26.0–34.0)
MCHC: 36.1 g/dL — ABNORMAL HIGH (ref 30.0–36.0)
MCV: 91.7 fL (ref 80.0–100.0)
Platelets: 139 10*3/uL — ABNORMAL LOW (ref 150–400)
RBC: 5.05 MIL/uL (ref 4.22–5.81)
RDW: 11.9 % (ref 11.5–15.5)
WBC: 4.1 10*3/uL (ref 4.0–10.5)
nRBC: 0 % (ref 0.0–0.2)

## 2020-03-28 LAB — HEPATIC FUNCTION PANEL
ALT: 35 U/L (ref 0–44)
AST: 34 U/L (ref 15–41)
Albumin: 4.3 g/dL (ref 3.5–5.0)
Alkaline Phosphatase: 159 U/L — ABNORMAL HIGH (ref 38–126)
Bilirubin, Direct: 0.2 mg/dL (ref 0.0–0.2)
Indirect Bilirubin: 0.9 mg/dL (ref 0.3–0.9)
Total Bilirubin: 1.1 mg/dL (ref 0.3–1.2)
Total Protein: 8.9 g/dL — ABNORMAL HIGH (ref 6.5–8.1)

## 2020-03-28 LAB — SARS CORONAVIRUS 2 BY RT PCR (HOSPITAL ORDER, PERFORMED IN ~~LOC~~ HOSPITAL LAB): SARS Coronavirus 2: NEGATIVE

## 2020-03-28 LAB — BRAIN NATRIURETIC PEPTIDE: B Natriuretic Peptide: 264.6 pg/mL — ABNORMAL HIGH (ref 0.0–100.0)

## 2020-03-28 LAB — GLUCOSE, CAPILLARY: Glucose-Capillary: 109 mg/dL — ABNORMAL HIGH (ref 70–99)

## 2020-03-28 LAB — MAGNESIUM: Magnesium: 1.2 mg/dL — ABNORMAL LOW (ref 1.7–2.4)

## 2020-03-28 LAB — LIPASE, BLOOD: Lipase: 63 U/L — ABNORMAL HIGH (ref 11–51)

## 2020-03-28 MED ORDER — ONDANSETRON HCL 4 MG/2ML IJ SOLN: 4.0000 mg | Freq: Four times a day (QID) | INTRAMUSCULAR | Status: DC | PRN

## 2020-03-28 MED ORDER — MAGNESIUM SULFATE 2 GM/50ML IV SOLN
2.0000 g | Freq: Once | INTRAVENOUS | Status: AC
  Administered 2020-03-28: 2 g via INTRAVENOUS
  Filled 2020-03-28: qty 50

## 2020-03-28 MED ORDER — ONDANSETRON HCL 4 MG PO TABS: 4.0000 mg | ORAL_TABLET | Freq: Four times a day (QID) | ORAL | Status: DC | PRN

## 2020-03-28 MED ORDER — ATORVASTATIN CALCIUM 20 MG PO TABS
40.0000 mg | ORAL_TABLET | Freq: Every day | ORAL | Status: DC
  Administered 2020-03-29 – 2020-03-30 (×2): 40 mg via ORAL
  Filled 2020-03-28 (×2): qty 2

## 2020-03-28 MED ORDER — INSULIN DETEMIR 100 UNIT/ML ~~LOC~~ SOLN
15.0000 [IU] | Freq: Two times a day (BID) | SUBCUTANEOUS | Status: DC
  Administered 2020-03-29 – 2020-03-31 (×5): 15 [IU] via SUBCUTANEOUS
  Filled 2020-03-28 (×7): qty 0.15

## 2020-03-28 MED ORDER — CLINDAMYCIN PHOSPHATE 1 % EX SOLN
1.0000 "application " | Freq: Two times a day (BID) | CUTANEOUS | Status: DC
  Filled 2020-03-28 (×2): qty 30

## 2020-03-28 MED ORDER — INSULIN ASPART 100 UNIT/ML ~~LOC~~ SOLN
0.0000 [IU] | Freq: Three times a day (TID) | SUBCUTANEOUS | Status: DC
  Administered 2020-03-30: 4 [IU] via SUBCUTANEOUS
  Filled 2020-03-28 (×2): qty 1

## 2020-03-28 MED ORDER — SUCRALFATE 1 G PO TABS
1.0000 g | ORAL_TABLET | Freq: Four times a day (QID) | ORAL | Status: DC
  Administered 2020-03-28: 1 g via ORAL
  Filled 2020-03-28: qty 1

## 2020-03-28 MED ORDER — LIDOCAINE VISCOUS HCL 2 % MT SOLN
15.0000 mL | Freq: Four times a day (QID) | OROMUCOSAL | Status: DC | PRN
  Filled 2020-03-28: qty 15

## 2020-03-28 MED ORDER — TIOTROPIUM BROMIDE MONOHYDRATE 18 MCG IN CAPS
18.0000 ug | ORAL_CAPSULE | Freq: Every day | RESPIRATORY_TRACT | Status: DC
  Administered 2020-03-30: 18 ug via RESPIRATORY_TRACT
  Filled 2020-03-28: qty 5

## 2020-03-28 MED ORDER — LACTATED RINGERS IV BOLUS
500.0000 mL | Freq: Once | INTRAVENOUS | Status: AC
  Administered 2020-03-28: 500 mL via INTRAVENOUS

## 2020-03-28 MED ORDER — SODIUM CHLORIDE 0.9% FLUSH
3.0000 mL | Freq: Once | INTRAVENOUS | Status: AC
  Administered 2020-03-28: 3 mL via INTRAVENOUS

## 2020-03-28 MED ORDER — ALUM & MAG HYDROXIDE-SIMETH 200-200-20 MG/5ML PO SUSP
30.0000 mL | Freq: Three times a day (TID) | ORAL | Status: DC
  Administered 2020-03-29 – 2020-03-31 (×7): 30 mL via ORAL
  Filled 2020-03-28 (×8): qty 30

## 2020-03-28 MED ORDER — SUCRALFATE 1 GM/10ML PO SUSP
1.0000 g | Freq: Three times a day (TID) | ORAL | Status: DC
  Administered 2020-03-29 – 2020-03-31 (×9): 1 g via ORAL
  Filled 2020-03-28 (×11): qty 10

## 2020-03-28 MED ORDER — GABAPENTIN 300 MG PO CAPS
300.0000 mg | ORAL_CAPSULE | Freq: Two times a day (BID) | ORAL | Status: DC
  Administered 2020-03-30: 300 mg via ORAL
  Filled 2020-03-28 (×5): qty 1

## 2020-03-28 MED ORDER — IOHEXOL 300 MG/ML  SOLN
100.0000 mL | Freq: Once | INTRAMUSCULAR | Status: AC | PRN
  Administered 2020-03-28: 100 mL via INTRAVENOUS

## 2020-03-28 MED ORDER — LORAZEPAM 0.5 MG PO TABS
0.5000 mg | ORAL_TABLET | Freq: Once | ORAL | Status: AC
  Administered 2020-03-28: 0.5 mg via ORAL
  Filled 2020-03-28: qty 1

## 2020-03-28 MED ORDER — LORAZEPAM 2 MG/ML IJ SOLN
INTRAMUSCULAR | Status: AC
  Filled 2020-03-28: qty 1

## 2020-03-28 MED ORDER — ALBUTEROL SULFATE (2.5 MG/3ML) 0.083% IN NEBU: 2.5000 mg | INHALATION_SOLUTION | Freq: Four times a day (QID) | RESPIRATORY_TRACT | Status: DC | PRN

## 2020-03-28 MED ORDER — PANCRELIPASE (LIP-PROT-AMYL) 12000-38000 UNITS PO CPEP
24000.0000 [IU] | ORAL_CAPSULE | Freq: Every day | ORAL | Status: DC
  Administered 2020-03-29 – 2020-03-31 (×8): 24000 [IU] via ORAL
  Filled 2020-03-28 (×15): qty 2

## 2020-03-28 MED ORDER — PANTOPRAZOLE SODIUM 40 MG IV SOLR
40.0000 mg | Freq: Once | INTRAVENOUS | Status: AC
  Administered 2020-03-28: 40 mg via INTRAVENOUS
  Filled 2020-03-28: qty 40

## 2020-03-28 MED ORDER — ONDANSETRON 4 MG PO TBDP: 4.0000 mg | ORAL_TABLET | Freq: Three times a day (TID) | ORAL | Status: DC | PRN

## 2020-03-28 MED ORDER — INSULIN ASPART 100 UNIT/ML ~~LOC~~ SOLN: 0.0000 [IU] | Freq: Every day | SUBCUTANEOUS | Status: DC

## 2020-03-28 MED ORDER — HEPARIN SODIUM (PORCINE) 5000 UNIT/ML IJ SOLN
5000.0000 [IU] | Freq: Three times a day (TID) | INTRAMUSCULAR | Status: DC
  Administered 2020-03-28 – 2020-03-31 (×7): 5000 [IU] via SUBCUTANEOUS
  Filled 2020-03-28 (×7): qty 1

## 2020-03-28 MED ORDER — PANTOPRAZOLE SODIUM 40 MG IV SOLR
40.0000 mg | Freq: Two times a day (BID) | INTRAVENOUS | Status: DC
  Administered 2020-03-28 – 2020-03-31 (×6): 40 mg via INTRAVENOUS
  Filled 2020-03-28 (×6): qty 40

## 2020-03-28 MED ORDER — SODIUM CHLORIDE 0.9 % IV SOLN: INTRAVENOUS | Status: DC

## 2020-03-28 MED ORDER — ALUM & MAG HYDROXIDE-SIMETH 200-200-20 MG/5ML PO SUSP
30.0000 mL | Freq: Once | ORAL | Status: AC
  Administered 2020-03-28: 30 mL via ORAL
  Filled 2020-03-28: qty 30

## 2020-03-28 MED ORDER — TORSEMIDE 20 MG PO TABS
40.0000 mg | ORAL_TABLET | Freq: Every day | ORAL | Status: DC
  Administered 2020-03-29 – 2020-03-31 (×3): 40 mg via ORAL
  Filled 2020-03-28 (×3): qty 2

## 2020-03-28 MED ORDER — FAMOTIDINE 20 MG PO TABS
20.0000 mg | ORAL_TABLET | Freq: Two times a day (BID) | ORAL | Status: DC
  Administered 2020-03-28: 20 mg via ORAL
  Filled 2020-03-28: qty 1

## 2020-03-28 MED ORDER — UMECLIDINIUM-VILANTEROL 62.5-25 MCG/INH IN AEPB
1.0000 | INHALATION_SPRAY | Freq: Every day | RESPIRATORY_TRACT | Status: DC
  Administered 2020-03-29 – 2020-03-31 (×3): 1 via RESPIRATORY_TRACT
  Filled 2020-03-28: qty 14

## 2020-03-28 NOTE — H&P (Signed)
History and Physical   Oscar [name] OFB:[id number] DOB: Sep 28, 1975 DOA: 03/28/2020  Referring MD/NP/PA: Dr. Charna Archer  PCP: [facility] [place]   Outpatient Specialists: At [facility]  Patient coming from: Home  Chief Complaint: Abdominal pain  HPI: Oscar [name] is a 44 y.o. male with medical history significant of ischemic cardiomyopathy status post AICD placement, asthma, diabetes, hyperlipidemia, hypertension, morbid obesity, chronic pancreatitis, sleep apnea among other things who was seen about 2 weeks ago in the ER with possible CHF exacerbation.  Patient left AMA.  He has apparently been having significant epigastric pain which is persistent going to his back.  He was suspected to have either peptic ulcer disease or severe GERD.  Patient was sent to an outside gastroenterologist in [place] who wanted to do an EGD but needed cardiac clearance.  That has not been done yet.  Pain got worse tonight persistent.  Denied melena denied any bright red blood per rectum but is having nausea and occasional vomiting.  Denied chest pain.  Denied any significant shortness of breath above his baseline.  He came to the ER where he was seen and evaluated.  Patient being admitted to the hospital for symptom treatment and possible assessment for EGD.Marland Kitchen  ED Course: Temperature 98.9 blood pressure 112/90 pulse 83 respiratory rate of 16 oxygen sat 96% room air.  White count 4.1 hemoglobin 16.7 platelets 139.  Sodium 133 potassium 4.3 chloride 94 CO2 28 BUN 33 creatinine 2.17 calcium 8.9.  Troponin is 68.  CT abdomen pelvis showed no acute abnormalities.  There is findings suggestive of cirrhosis and periumbilical hernia containing fat.  Patient will be admitted to the hospital for treatment as above.  Review of Systems: As per HPI otherwise 10 point review of systems negative.    Past Medical History:  Diagnosis Date  . AICD (automatic cardioverter/defibrillator) present    . Asthma   . Cardiomyopathy ([place])   . CHF (congestive heart failure) ([place])   . Coronary artery disease   . Deafness in right ear   . Diabetes mellitus without complication ([place])   . Dilated cardiomyopathy ([place])   . Dysrhythmia    svt  . 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 GERD (gastroesophageal reflux disease)   . Hyperlipidemia   . Hypertension   . Hypoxemia   . Hypoxemia   . Mild obesity   . Myocardial infarction ([place])    7824,2353,6/14  . Pancreatitis   . Sleep apnea    osa    Past Surgical History:  Procedure Laterality Date  . CARDIAC CATHETERIZATION  12/11/2014   Procedure: RIGHT/LEFT HEART CATH AND CORONARY ANGIOGRAPHY;  Surgeon: Lorretta Harp, MD;  Location: [facility] CATH LAB;  Service: Cardiovascular;;  . ICD LEAD REMOVAL N/A 03/30/2015   Procedure: ICD LEAD REMOVAL;  Surgeon: Marzetta Board, MD;  Location: [facility] ORS;  Service: Cardiovascular;  Laterality: N/A;  . IMPLANTABLE CARDIOVERTER DEFIBRILLATOR IMPLANT    . INSERT / REPLACE / REMOVE PACEMAKER    . LEFT HEART CATHETERIZATION WITH CORONARY ANGIOGRAM N/A 12/09/2014   Procedure: LEFT HEART CATHETERIZATION WITH CORONARY ANGIOGRAM;  Surgeon: Burnell Blanks, MD;  Location: [facility] CATH LAB;  Service: Cardiovascular;  Laterality: N/A;     reports that he has been smoking cigarettes. He has a 12.00 pack-year smoking history. He has never used smokeless tobacco. He reports that he does not drink alcohol and does not use drugs.  Allergies  Allergen Reactions  .  Bidil [Isosorb Dinitrate-Hydralazine] Other (See Comments)    Migraine Headache Migraine Headache  . Ciprofloxacin     Family History  Problem Relation Age of Onset  . Hypertension Mother   . Congestive Heart Failure Mother   . Hypertension Sister   . Diabetes Sister   . Pancreatitis Sister   . COPD Sister   . Pancreatitis Brother   . Anemia Neg Hx   . Arrhythmia Neg Hx   . Asthma Neg Hx   . Clotting disorder Neg Hx   . Fainting Neg Hx    . Heart attack Neg Hx   . Heart disease Neg Hx   . Heart failure Neg Hx   . Hyperlipidemia Neg Hx      Prior to Admission medications   Medication Sig Start Date End Date Taking? Authorizing Provider  albuterol (VENTOLIN HFA) 108 (90 Base) MCG/ACT inhaler 2 inhalations every 6 (six) hours as needed 01/21/19  Yes [provider]  atorvastatin (LIPITOR) 40 MG tablet Take 1 tablet (40 mg total) by mouth daily at 6 PM. 11/04/18  Yes Hackney, Tina A, FNP  clindamycin (CLEOCIN T) 1 % external solution Apply 1 application topically 2 (two) times daily.  02/16/20  Yes [provider]  clopidogrel (PLAVIX) 75 MG tablet Take 1 tablet (75 mg total) by mouth daily. 02/28/20  Yes Clabe Seal, PA-C  famotidine (PEPCID) 20 MG tablet Take 1 tablet (20 mg total) by mouth 2 (two) times daily. 05/18/18  Yes Carrie Mew, MD  gabapentin (NEURONTIN) 300 MG capsule Take 300 mg by mouth 2 (two) times daily.   Yes [provider]  insulin detemir (LEVEMIR FLEXTOUCH) 100 UNIT/ML FlexPen Inject 30 Units into the skin 2 (two) times daily.  11/10/18  Yes [provider]  lidocaine (XYLOCAINE) 2 % solution Use as directed 15 mLs in the mouth or throat every 6 (six) hours as needed (stomach pain). 12/29/19  Yes Nance Pear, MD  liraglutide (VICTOZA) 18 MG/3ML SOPN Inject 18 mg into the skin daily in the afternoon.   Yes [provider]  sacubitril-valsartan (ENTRESTO) 49-51 MG Take 1 tablet by mouth 2 (two) times daily.  06/13/19  Yes [provider]  tiotropium (SPIRIVA) 18 MCG inhalation capsule Place 18 mcg into inhaler and inhale daily.   Yes [provider]  torsemide (DEMADEX) 20 MG tablet Take 1 tablet (20 mg total) by mouth daily. Patient taking differently: Take 40 mg by mouth daily.  11/04/18  Yes Hackney, Tina A, FNP  umeclidinium-vilanterol (ANORO ELLIPTA) 62.5-25 MCG/INH AEPB Inhale 1 puff into the lungs daily in the afternoon.  01/31/19  Yes  [provider]  ZENPEP 40000-126000 units CPEP Take 2-4 capsules by mouth 5 (five) times daily. 4CAPSULE-BREAKFAST 2CAPSULE-SNACK 4CAPSULE-LUNCH 2CAPSULE-SNACK 4CAPSULE-DINNER 03/20/20  Yes [provider]  aluminum-magnesium hydroxide-simethicone (MAALOX) 517-616-07 MG/5ML SUSP Take 30 mLs by mouth 4 (four) times daily -  before meals and at bedtime. Patient not taking: Reported on 03/28/2020 05/18/18   Carrie Mew, MD  aspirin EC 325 MG tablet Take 1 tablet (325 mg total) by mouth daily. Patient not taking: Reported on 03/28/2020 07/10/15   Aldean Jewett, MD  meloxicam (MOBIC) 15 MG tablet Take 1 tablet (15 mg total) by mouth daily. Patient not taking: Reported on 02/27/2020 01/26/20   [name], DPM  ondansetron (ZOFRAN ODT) 4 MG disintegrating tablet Take 1 tablet (4 mg total) by mouth every 8 (eight) hours as needed for nausea or vomiting. Patient not taking:  Reported on 02/27/2020 01/06/20   Blake Divine, MD  oxyCODONE-acetaminophen (PERCOCET) 5-325 MG tablet Take 1 tablet by mouth every 4 (four) hours as needed for severe pain. Patient not taking: Reported on 02/27/2020 01/06/20 01/05/21  Blake Divine, MD  sucralfate (CARAFATE) 1 g tablet Take 1 tablet (1 g total) by mouth 4 (four) times daily. Patient not taking: Reported on 02/27/2020 12/29/19   Nance Pear, MD    Physical Exam: Vitals:   03/28/20 1053 03/28/20 1056 03/28/20 1416  BP:  (!) 112/90 (!) 104/87  Pulse:  83 74  Resp:  16 16  Temp:  98.9 F (37.2 C) 98.2 F (36.8 C)  TempSrc:  Oral Oral  SpO2:  96% 98%  Weight: (!) 139.8 kg    Height: 5\' 6"  (1.676 m)        Constitutional: Morbidly obese, acutely ill looking Vitals:   03/28/20 1053 03/28/20 1056 03/28/20 1416  BP:  (!) 112/90 (!) 104/87  Pulse:  83 74  Resp:  16 16  Temp:  98.9 F (37.2 C) 98.2 F (36.8 C)  TempSrc:  Oral Oral  SpO2:  96% 98%  Weight: (!) 139.8 kg    Height: 5\' 6"  (1.676 m)     Eyes: PERRL, lids and  conjunctivae normal ENMT: Mucous membranes are dry. Posterior pharynx clear of any exudate or lesions.Normal dentition.  Neck: normal, supple, no masses, no thyromegaly Respiratory: clear to auscultation bilaterally, no wheezing, mild basal crackles. Normal respiratory effort. No accessory muscle use.  Cardiovascular: Regular rate and rhythm, no murmurs / rubs / gallops. No extremity edema. 2+ pedal pulses. No carotid bruits.  Abdomen: Epigastric tenderness no masses palpated. No hepatosplenomegaly. Bowel sounds positive.  Musculoskeletal: no clubbing / cyanosis. No joint deformity upper and lower extremities. Good ROM, no contractures. Normal muscle tone.  Skin: no rashes, lesions, ulcers. No induration Neurologic: CN 2-12 grossly intact. Sensation intact, DTR normal. Strength 5/5 in all 4.  Psychiatric: Normal judgment and insight. Alert and oriented x 3. Normal mood.     Labs on Admission: I have personally reviewed following labs and imaging studies  CBC: Recent Labs  Lab 03/28/20 1056  WBC 4.1  HGB 16.7  HCT 46.3  MCV 91.7  PLT 591*   Basic Metabolic Panel: Recent Labs  Lab 03/28/20 1056 03/28/20 1710  NA 133*  --   K 4.3  --   CL 94*  --   CO2 28  --   GLUCOSE 161*  --   BUN 33*  --   CREATININE 2.17*  --   CALCIUM 8.9  --   MG  --  1.2*   GFR: Estimated Creatinine Clearance: 57.9 mL/min (A) (by C-G formula based on SCr of 2.17 mg/dL (H)). Liver Function Tests: Recent Labs  Lab 03/28/20 1710  AST 34  ALT 35  ALKPHOS 159*  BILITOT 1.1  PROT 8.9*  ALBUMIN 4.3   Recent Labs  Lab 03/28/20 1056  LIPASE 63*   No results for input(s): AMMONIA in the last 168 hours. Coagulation Profile: No results for input(s): INR, PROTIME in the last 168 hours. Cardiac Enzymes: No results for input(s): CKTOTAL, CKMB, CKMBINDEX, TROPONINI in the last 168 hours. BNP (last 3 results) No results for input(s): PROBNP in the last 8760 hours. HbA1C: No results for input(s):  HGBA1C in the last 72 hours. CBG: No results for input(s): GLUCAP in the last 168 hours. Lipid Profile: No results for input(s): CHOL, HDL, LDLCALC, TRIG, CHOLHDL, LDLDIRECT in  the last 72 hours. Thyroid Function Tests: No results for input(s): TSH, T4TOTAL, FREET4, T3FREE, THYROIDAB in the last 72 hours. Anemia Panel: No results for input(s): VITAMINB12, FOLATE, FERRITIN, TIBC, IRON, RETICCTPCT in the last 72 hours. Urine analysis:    Component Value Date/Time   COLORURINE YELLOW (A) 01/06/2020 0923   APPEARANCEUR CLEAR (A) 01/06/2020 0923   APPEARANCEUR Hazy 08/21/2014 0800   LABSPEC 1.012 01/06/2020 0923   LABSPEC 1.015 08/21/2014 0800   PHURINE 5.0 01/06/2020 0923   GLUCOSEU NEGATIVE 01/06/2020 0923   GLUCOSEU Negative 08/21/2014 0800   HGBUR NEGATIVE 01/06/2020 0923   BILIRUBINUR NEGATIVE 01/06/2020 0923   BILIRUBINUR Negative 08/21/2014 0800   KETONESUR 5 (A) 01/06/2020 0923   PROTEINUR >=300 (A) 01/06/2020 0923   NITRITE NEGATIVE 01/06/2020 0923   LEUKOCYTESUR NEGATIVE 01/06/2020 0923   LEUKOCYTESUR 1+ 08/21/2014 0800   Sepsis Labs: @LABRCNTIP (procalcitonin:4,lacticidven:4) )No results found for this or any previous visit (from the past 240 hour(s)).   Radiological Exams on Admission: DG Chest 2 View  Result Date: 03/28/2020 CLINICAL DATA:  Chest pain. EXAM: CHEST - 2 VIEW COMPARISON:  Prior chest radiographs 02/27/2020 and earlier. FINDINGS: Redemonstrated left chest multi lead implantable cardiac device. Unchanged cardiomegaly. There is no appreciable airspace consolidation or pulmonary edema. No evidence of pleural effusion or pneumothorax. No acute bony abnormality is identified. IMPRESSION: Unchanged cardiomegaly. No evidence of acute cardiopulmonary abnormality. Electronically Signed   By: Kellie Simmering DO   On: 03/28/2020 11:25   CT ABDOMEN PELVIS W CONTRAST  Result Date: 03/28/2020 CLINICAL DATA:  Acute abdominal pain nonlocalized. EXAM: CT ABDOMEN AND PELVIS  WITH CONTRAST TECHNIQUE: Multidetector CT imaging of the abdomen and pelvis was performed using the standard protocol following bolus administration of intravenous contrast. CONTRAST:  177mL OMNIPAQUE IOHEXOL 300 MG/ML  SOLN COMPARISON:  CT abdomen pelvis 01/06/2020 FINDINGS: Lower chest: Lung bases clear bilaterally. The heart is enlarged with a pacemaker extending into the right ventricle. The left ventricle is significantly dilated. Hepatobiliary: Course texture to the liver with enlargement left lobe and mild irregularity of the liver capsule suggesting cirrhosis. No liver lesion. Gallbladder and bile ducts within normal limits. Pancreas: Negative Spleen: Negative Adrenals/Urinary Tract: Adrenal glands are unremarkable. Kidneys are normal, without renal calculi, focal lesion, or hydronephrosis. Bladder is unremarkable. Stomach/Bowel: Negative for bowel obstruction. No bowel mass or edema. Normal appendix. Vascular/Lymphatic: Mild atherosclerotic aorta without aneurysm. 14 mm portacaval lymph node unchanged from prior studies. No other prominent lymph nodes identified. Reproductive: Mild prostate enlargement Other: Periumbilical hernia containing abdominal fat, unchanged from the prior study. No significant edema or fluid in the hernia. Musculoskeletal: Lumbar disc degeneration and spurring. No acute skeletal abnormality. IMPRESSION: No acute abnormality no change from the prior study. Findings suggestive of cirrhosis. No ascites or mass. Portacaval lymph node stable. Periumbilical hernia containing fat unchanged. Electronically Signed   By: Franchot Gallo M.D.   On: 03/28/2020 18:39      Assessment/Plan Principal Problem:   Abdominal pain Active Problems:   Hypertension   Chronic systolic heart failure (HCC)   Diabetes (HCC)   Tobacco use   COPD (chronic obstructive pulmonary disease) (HCC)   ARF (acute renal failure) (HCC)     #1 abdominal pain: Probably epigastric pain is due to severe GERD  or peptic ulcer disease.  Patient will likely need an EGD.  Due to his cardiac status will admit the patient.  We will hold his Plavix and get cardiology and GI consultation in the morning to evaluate  for possible EGD and treatment.  Initiate IV Protonix and sacral fate for now.  #2 chronic systolic dysfunction: Patient is dry from using his diuretics.  I will give him gentle fluids and then resume diuretics.  Otherwise continue telemetry monitoring.  #3 acute kidney injury: Patient has had poor oral intake.  Is also been on diuretics.  Also on ACE.  I will hold those for just tonight and hydrate briefly.  Continue with treatment.  #4 COPD: No exacerbation.  Continue to monitor.  #5 diabetes: Continue sliding scale insulin with home regimen.  #6 essential hypertension: Continue home regimen.  Hold nephrotoxic medications.  #7 tobacco abuse: Nicotine patch and tobacco cessation counseling.   DVT prophylaxis: Heparin Code Status: Full code Family Communication: No family at bedside Disposition Plan: Home Consults called: None with GI and cardiology to be consulted in the morning Admission status: Inpatient  Severity of Illness: The appropriate patient status for this patient is INPATIENT. Inpatient status is judged to be reasonable and necessary in order to provide the required intensity of service to ensure the patient's safety. The patient's presenting symptoms, physical exam findings, and initial radiographic and laboratory data in the context of their chronic comorbidities is felt to place them at high risk for further clinical deterioration. Furthermore, it is not anticipated that the patient will be medically stable for discharge from the hospital within 2 midnights of admission. The following factors support the patient status of inpatient.   " The patient's presenting symptoms include epigastric pain. " The worrisome physical exam findings include epigastric tenderness. " The initial  radiographic and laboratory data are worrisome because of acute kidney injury. " The chronic co-morbidities include chronic systolic dysfunction.   * I certify that at the point of admission it is my clinical judgment that the patient will require inpatient hospital care spanning beyond 2 midnights from the point of admission due to high intensity of service, high risk for further deterioration and high frequency of surveillance required.Barbette Merino MD [facility] Pager [phone]  If 7PM-7AM, please contact night-coverage [email or web address] Password [facility]  03/28/2020, 7:58 PM

## 2020-03-28 NOTE — ED Triage Notes (Signed)
Arrives with c/o severe stomach pains x 3 weeks.  States unable to eat.  Also c/o chest tightness.  AAOx3.  Skin warm and dry. NAD

## 2020-03-28 NOTE — ED Provider Notes (Signed)
[facility] Emergency Department Provider Note  ____________________________________________   First MD Initiated Contact with Patient 03/28/20 1621     (approximate)  I have reviewed the triage vital signs and the nursing notes.   HISTORY  Chief Complaint Abdominal Pain and Chest Pain   HPI     44 y.o. male with medical history significant for chronic systolic heart failure, dilated cardiomyopathy status post AICD insertion ( LVEF 20 - 25%), obesity, coronary artery disease, copd, diabetes mellitus and obstructive sleep apnea as well as recent admission last month for chest pain with discharge [place] after plan for cardiac catheter presents for assessment of approximately 3 weeks of worsening epigastric abdominal pain described as burning associated with chest tightness.  Patient states his abdominal pain feels separate from his chest tightness and does not feel like he felt before his last admission.  However he says his chest tightness is similar to how he felt before.  He denies any vomiting, diarrhea, dysuria, back pain, cough, shortness of breath, fevers, rash, extremity pain, or other acute complaints.  States he has not been taking his medicines the last several days secondary to abdominal pain.  Also endorses poor p.o. appetite over the last several days.  Endorses tobacco abuse but denies illegal drug use or EtOH abuse.   Of note patient notes he was seen at [facility] on the 26 and was told he may need EGD for possible severe gastritis.  Past Medical History:  Diagnosis Date  . AICD (automatic cardioverter/defibrillator) present   . Asthma   . Cardiomyopathy ([place])   . CHF (congestive heart failure) ([place])   . Coronary artery disease   . Deafness in right ear   . Diabetes mellitus without complication ([place])   . Dilated cardiomyopathy ([place])   . Dysrhythmia    svt  . 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 GERD (gastroesophageal  reflux disease)   . Hyperlipidemia   . Hypertension   . Hypoxemia   . Hypoxemia   . Mild obesity   . Myocardial infarction ([place])    [id number],9604,5/40  . Pancreatitis   . Sleep apnea    osa    Patient Active Problem List   Diagnosis Date Noted  . Acute on chronic combined systolic (congestive) and diastolic (congestive) heart failure ([place]) 02/27/2020  . COPD (chronic obstructive pulmonary disease) ([place]) 02/27/2020  . Sprain of foot 01/26/2020  . Foreign body granuloma of skin and subcutaneous tissue 05/26/2019  . Acute pancreatitis 05/19/2017  . Pancreatitis 09/12/2016  . Bronchitis 05/20/2016  . Constipation 11/08/2015  . Abdominal pain 11/02/2015  . Obstructive sleep apnea 07/31/2015  . Tobacco use 07/31/2015  . Diabetes ([place]) 07/10/2015  . Chronic systolic heart failure ([place]) 03/30/2015  . Cardiac pacemaker 03/30/2015  . Chronic respiratory failure with hypercapnia ([place]) 12/12/2014  . Elevated troponin   . Non-ischemic cardiomyopathy ([place])   . Hypoxemia   . Acute combined systolic and diastolic heart failure ([place])   . SVT (supraventricular tachycardia) ([place]) 12/09/2014  . NSTEMI (non-ST elevated myocardial infarction) ([place]) 12/09/2014  . Hypertension 12/09/2014    Past Surgical History:  Procedure Laterality Date  . CARDIAC CATHETERIZATION  12/11/2014   Procedure: RIGHT/LEFT HEART CATH AND CORONARY ANGIOGRAPHY;  Surgeon: Lorretta Harp, MD;  Location: [facility] CATH LAB;  Service: Cardiovascular;;  . ICD LEAD REMOVAL N/A 03/30/2015   Procedure: ICD LEAD REMOVAL;  Surgeon: Marzetta Board, MD;  Location: [facility] ORS;  Service: Cardiovascular;  Laterality: N/A;  . IMPLANTABLE CARDIOVERTER DEFIBRILLATOR IMPLANT    . INSERT / REPLACE / REMOVE PACEMAKER    . LEFT HEART CATHETERIZATION WITH CORONARY ANGIOGRAM N/A 12/09/2014   Procedure: LEFT HEART CATHETERIZATION WITH CORONARY ANGIOGRAM;  Surgeon: Burnell Blanks, MD;  Location: [facility] CATH LAB;  Service: Cardiovascular;  Laterality:  N/A;    Prior to Admission medications   Medication Sig Start Date End Date Taking? Authorizing Provider  albuterol (VENTOLIN HFA) 108 (90 Base) MCG/ACT inhaler 2 inhalations every 6 (six) hours as needed 01/21/19  Yes [provider]  atorvastatin (LIPITOR) 40 MG tablet Take 1 tablet (40 mg total) by mouth daily at 6 PM. 11/04/18  Yes Hackney, Tina A, FNP  clindamycin (CLEOCIN T) 1 % external solution Apply 1 application topically 2 (two) times daily.  02/16/20  Yes [provider]  clopidogrel (PLAVIX) 75 MG tablet Take 1 tablet (75 mg total) by mouth daily. 02/28/20  Yes Clabe Seal, PA-C  famotidine (PEPCID) 20 MG tablet Take 1 tablet (20 mg total) by mouth 2 (two) times daily. 05/18/18  Yes Carrie Mew, MD  gabapentin (NEURONTIN) 300 MG capsule Take 300 mg by mouth 2 (two) times daily.   Yes [provider]  insulin detemir (LEVEMIR FLEXTOUCH) 100 UNIT/ML FlexPen Inject 30 Units into the skin 2 (two) times daily.  11/10/18  Yes [provider]  lidocaine (XYLOCAINE) 2 % solution Use as directed 15 mLs in the mouth or throat every 6 (six) hours as needed (stomach pain). 12/29/19  Yes Nance Pear, MD  liraglutide (VICTOZA) 18 MG/3ML SOPN Inject 18 mg into the skin daily in the afternoon.   Yes [provider]  sacubitril-valsartan (ENTRESTO) 49-51 MG Take 1 tablet by mouth 2 (two) times daily.  06/13/19  Yes [provider]  tiotropium (SPIRIVA) 18 MCG inhalation capsule Place 18 mcg into inhaler and inhale daily.   Yes [provider]  torsemide (DEMADEX) 20 MG tablet Take 1 tablet (20 mg total) by mouth daily. Patient taking differently: Take 40 mg by mouth daily.  11/04/18  Yes Hackney, Tina A, FNP  umeclidinium-vilanterol (ANORO ELLIPTA) 62.5-25 MCG/INH AEPB Inhale 1 puff into the lungs daily in the afternoon.  01/31/19  Yes [provider]  ZENPEP 40000-126000 units CPEP Take 2-4 capsules by mouth 5 (five) times daily.  4CAPSULE-BREAKFAST 2CAPSULE-SNACK 4CAPSULE-LUNCH 2CAPSULE-SNACK 4CAPSULE-DINNER 03/20/20  Yes [provider]  aluminum-magnesium hydroxide-simethicone (MAALOX) 454-098-11 MG/5ML SUSP Take 30 mLs by mouth 4 (four) times daily -  before meals and at bedtime. Patient not taking: Reported on 03/28/2020 05/18/18   Carrie Mew, MD  aspirin EC 325 MG tablet Take 1 tablet (325 mg total) by mouth daily. Patient not taking: Reported on 03/28/2020 07/10/15   Aldean Jewett, MD  meloxicam (MOBIC) 15 MG tablet Take 1 tablet (15 mg total) by mouth daily. Patient not taking: Reported on 02/27/2020 01/26/20   [name], DPM  ondansetron (ZOFRAN ODT) 4 MG disintegrating tablet Take 1 tablet (4 mg total) by mouth every 8 (eight) hours as needed for nausea or vomiting. Patient not taking: Reported on 02/27/2020 01/06/20   Blake Divine, MD  oxyCODONE-acetaminophen (PERCOCET) 5-325 MG tablet Take 1 tablet by mouth every 4 (four) hours as needed for severe pain. Patient not taking: Reported on 02/27/2020 01/06/20 01/05/21  Blake Divine, MD  sucralfate (CARAFATE) 1 g tablet Take 1 tablet (1 g total) by mouth 4 (four) times daily. Patient not taking: Reported on 02/27/2020  12/29/19   Nance Pear, MD    Allergies Bidil [isosorb dinitrate-hydralazine] and Ciprofloxacin  Family History  Problem Relation Age of Onset  . Hypertension Mother   . Congestive Heart Failure Mother   . Hypertension Sister   . Diabetes Sister   . Pancreatitis Sister   . COPD Sister   . Pancreatitis Brother   . Anemia Neg Hx   . Arrhythmia Neg Hx   . Asthma Neg Hx   . Clotting disorder Neg Hx   . Fainting Neg Hx   . Heart attack Neg Hx   . Heart disease Neg Hx   . Heart failure Neg Hx   . Hyperlipidemia Neg Hx     Social History Social History   Tobacco Use  . Smoking status: Current Every Day Smoker    Packs/day: 0.50    Years: 24.00    Pack years: 12.00    Types: Cigarettes  . Smokeless tobacco:  Never Used  . Tobacco comment: 4/5 Smoking 5 cigs a day  Vaping Use  . Vaping Use: Never used  Substance Use Topics  . Alcohol use: No  . Drug use: No    Review of Systems  Review of Systems  Constitutional: Positive for malaise/fatigue. Negative for chills and fever.  HENT: Negative for sore throat.   Eyes: Negative for pain.  Respiratory: Negative for cough and stridor.   Cardiovascular: Positive for chest pain.  Gastrointestinal: Positive for abdominal pain. Negative for vomiting.  Skin: Negative for rash.  Neurological: Negative for seizures, loss of consciousness and headaches.  Psychiatric/Behavioral: Negative for suicidal ideas.  All other systems reviewed and are negative.     ____________________________________________   PHYSICAL EXAM:  VITAL SIGNS: ED Triage Vitals  Enc Vitals Group     BP 03/28/20 1056 (!) 112/90     Pulse Rate 03/28/20 1056 83     Resp 03/28/20 1056 16     Temp 03/28/20 1056 98.9 F (37.2 C)     Temp Source 03/28/20 1056 Oral     SpO2 03/28/20 1056 96 %     Weight 03/28/20 1053 (!) 308 lb 3.3 oz (139.8 kg)     Height 03/28/20 1053 5\' 6"  (1.676 m)     Head Circumference --      Peak Flow --      Pain Score 03/28/20 1053 8     Pain Loc --      Pain Edu? --      Excl. in [place]? --    Vitals:   03/28/20 1056 03/28/20 1416  BP: (!) 112/90 (!) 104/87  Pulse: 83 74  Resp: 16 16  Temp: 98.9 F (37.2 C) 98.2 F (36.8 C)  SpO2: 96% 98%   Physical Exam Vitals and nursing note reviewed.  Constitutional:      Appearance: He is well-developed. He is obese.  HENT:     Head: Normocephalic and atraumatic.     Right Ear: External ear normal.     Left Ear: External ear normal.     Nose: Nose normal.     Mouth/Throat:     Mouth: Mucous membranes are moist.  Eyes:     Conjunctiva/sclera: Conjunctivae normal.  Cardiovascular:     Rate and Rhythm: Normal rate and regular rhythm.     Heart sounds: No murmur heard.   Pulmonary:      Effort: Pulmonary effort is normal. No respiratory distress.     Breath sounds: Normal breath sounds.  Abdominal:  Palpations: Abdomen is soft.     Tenderness: There is abdominal tenderness in the epigastric area.  Musculoskeletal:     Cervical back: Neck supple.  Skin:    General: Skin is warm and dry.  Neurological:     Mental Status: He is alert and oriented to person, place, and time.  Psychiatric:        Mood and Affect: Mood normal.      ____________________________________________   LABS (all labs ordered are listed, but only abnormal results are displayed)  Labs Reviewed  BASIC METABOLIC PANEL - Abnormal; Notable for the following components:      Result Value   Sodium 133 (*)    Chloride 94 (*)    Glucose, Bld 161 (*)    BUN 33 (*)    Creatinine, Ser 2.17 (*)    GFR calc non Af Amer 36 (*)    GFR calc Af Amer 41 (*)    All other components within normal limits  CBC - Abnormal; Notable for the following components:   MCHC 36.1 (*)    Platelets 139 (*)    All other components within normal limits  LIPASE, BLOOD - Abnormal; Notable for the following components:   Lipase 63 (*)    All other components within normal limits  MAGNESIUM - Abnormal; Notable for the following components:   Magnesium 1.2 (*)    All other components within normal limits  BRAIN NATRIURETIC PEPTIDE - Abnormal; Notable for the following components:   B Natriuretic Peptide 264.6 (*)    All other components within normal limits  HEPATIC FUNCTION PANEL - Abnormal; Notable for the following components:   Total Protein 8.9 (*)    Alkaline Phosphatase 159 (*)    All other components within normal limits  TROPONIN I (HIGH SENSITIVITY) - Abnormal; Notable for the following components:   Troponin I (High Sensitivity) 79 (*)    All other components within normal limits  TROPONIN I (HIGH SENSITIVITY) - Abnormal; Notable for the following components:   Troponin I (High Sensitivity) 65 (*)     All other components within normal limits  SARS CORONAVIRUS 2 BY RT PCR (HOSPITAL ORDER, [facility])   ____________________________________________  EKG  Sinus rhythm with ventricular rate of 82, PVCs, left axis deviation, left bundle branch block, prolonged QTC at 493, and new T wave inversions in the lateral leads. ____________________________________________  RADIOLOGY  ED MD interpretation: Cardiomegaly which is unchanged when compared to prior without evidence of effusion, edema, pneumothorax, or consolidation.  Official radiology report(s): DG Chest 2 View  Result Date: 03/28/2020 CLINICAL DATA:  Chest pain. EXAM: CHEST - 2 VIEW COMPARISON:  Prior chest radiographs 02/27/2020 and earlier. FINDINGS: Redemonstrated left chest multi lead implantable cardiac device. Unchanged cardiomegaly. There is no appreciable airspace consolidation or pulmonary edema. No evidence of pleural effusion or pneumothorax. No acute bony abnormality is identified. IMPRESSION: Unchanged cardiomegaly. No evidence of acute cardiopulmonary abnormality. Electronically Signed   By: Kellie Simmering DO   On: 03/28/2020 11:25   CT ABDOMEN PELVIS W CONTRAST  Result Date: 03/28/2020 CLINICAL DATA:  Acute abdominal pain nonlocalized. EXAM: CT ABDOMEN AND PELVIS WITH CONTRAST TECHNIQUE: Multidetector CT imaging of the abdomen and pelvis was performed using the standard protocol following bolus administration of intravenous contrast. CONTRAST:  159mL OMNIPAQUE IOHEXOL 300 MG/ML  SOLN COMPARISON:  CT abdomen pelvis 01/06/2020 FINDINGS: Lower chest: Lung bases clear bilaterally. The heart is enlarged with a pacemaker extending into  the right ventricle. The left ventricle is significantly dilated. Hepatobiliary: Course texture to the liver with enlargement left lobe and mild irregularity of the liver capsule suggesting cirrhosis. No liver lesion. Gallbladder and bile ducts within normal limits. Pancreas:  Negative Spleen: Negative Adrenals/Urinary Tract: Adrenal glands are unremarkable. Kidneys are normal, without renal calculi, focal lesion, or hydronephrosis. Bladder is unremarkable. Stomach/Bowel: Negative for bowel obstruction. No bowel mass or edema. Normal appendix. Vascular/Lymphatic: Mild atherosclerotic aorta without aneurysm. 14 mm portacaval lymph node unchanged from prior studies. No other prominent lymph nodes identified. Reproductive: Mild prostate enlargement Other: Periumbilical hernia containing abdominal fat, unchanged from the prior study. No significant edema or fluid in the hernia. Musculoskeletal: Lumbar disc degeneration and spurring. No acute skeletal abnormality. IMPRESSION: No acute abnormality no change from the prior study. Findings suggestive of cirrhosis. No ascites or mass. Portacaval lymph node stable. Periumbilical hernia containing fat unchanged. Electronically Signed   By: Franchot Gallo M.D.   On: 03/28/2020 18:39    ____________________________________________   PROCEDURES  Procedure(s) performed (including Critical Care):  Procedures   ____________________________________________   INITIAL IMPRESSION / ASSESSMENT AND PLAN / ED COURSE        Overall patient's history, exam, and ED work-up is concerning for epigastric pain likely secondary to gastritis versus reflux with peptic ulcer disease also the differential.  No evidence on CT of perforation, SBO, common bile duct dilation, pancreatitis, or other acute process.  However patient is noted to have some cirrhosis.  Labs are not consistent with acute pancreatitis or cholestatic process.  EKG and serial troponins are not consistent with acute coronary thrombosis although mildly elevated troponin significantly down from 1 month ago where it was noted to be greater than 6000 is possibly secondary to very mild demand ischemia in the setting of dehydration.  Ativan given for some nausea patient has slightly  prolonged QTC and Zofran or other QTC prolonging agents deferred.  Creatinine most consistent with prerenal AKI given history of poor p.o. intake.  Patient was given magnesium, IV fluids, and admitted to hospital service in stable condition for further evaluation and management.    Medications  LORazepam (ATIVAN) 2 MG/ML injection (  Not Given 03/28/20 1725)  magnesium sulfate IVPB 2 g 50 mL (has no administration in time range)  sodium chloride flush (NS) 0.9 % injection 3 mL (3 mLs Intravenous Given 03/28/20 1726)  alum & mag hydroxide-simeth (MAALOX/MYLANTA) 200-200-20 MG/5ML suspension 30 mL (30 mLs Oral Given 03/28/20 1709)  lactated ringers bolus 500 mL (500 mLs Intravenous New Bag/Given 03/28/20 1720)  pantoprazole (PROTONIX) injection 40 mg (40 mg Intravenous Given 03/28/20 1709)  LORazepam (ATIVAN) tablet 0.5 mg (0.5 mg Oral Given 03/28/20 1725)  iohexol (OMNIPAQUE) 300 MG/ML solution 100 mL (100 mLs Intravenous Contrast Given 03/28/20 1801)        ____________________________________________   FINAL CLINICAL IMPRESSION(S) / ED DIAGNOSES  Final diagnoses:  AKI (acute kidney injury) ([place])  Acute gastritis without hemorrhage, unspecified gastritis type  Hypomagnesemia  Hepatic cirrhosis, unspecified hepatic cirrhosis type, unspecified whether ascites present [facility])     ED Discharge Orders    None       Note:  This document was prepared using Dragon voice recognition software and may include unintentional dictation errors.   Lucrezia Starch, MD 03/28/20 [phone]

## 2020-03-29 ENCOUNTER — Inpatient Hospital Stay: Payer: Medicare Other

## 2020-03-29 ENCOUNTER — Other Ambulatory Visit: Payer: Self-pay

## 2020-03-29 ENCOUNTER — Encounter: Payer: Self-pay | Admitting: Internal Medicine

## 2020-03-29 DIAGNOSIS — R1013 Epigastric pain: Secondary | ICD-10-CM

## 2020-03-29 DIAGNOSIS — E119 Type 2 diabetes mellitus without complications: Secondary | ICD-10-CM

## 2020-03-29 DIAGNOSIS — Z794 Long term (current) use of insulin: Secondary | ICD-10-CM

## 2020-03-29 LAB — GLUCOSE, CAPILLARY
Glucose-Capillary: 105 mg/dL — ABNORMAL HIGH (ref 70–99)
Glucose-Capillary: 126 mg/dL — ABNORMAL HIGH (ref 70–99)
Glucose-Capillary: 114 mg/dL — ABNORMAL HIGH (ref 70–99)
Glucose-Capillary: 89 mg/dL (ref 70–99)
Glucose-Capillary: 91 mg/dL (ref 70–99)

## 2020-03-29 LAB — COMPREHENSIVE METABOLIC PANEL
ALT: 27 U/L (ref 0–44)
AST: 31 U/L (ref 15–41)
Albumin: 3.7 g/dL (ref 3.5–5.0)
Alkaline Phosphatase: 135 U/L — ABNORMAL HIGH (ref 38–126)
Anion gap: 11 (ref 5–15)
BUN: 27 mg/dL — ABNORMAL HIGH (ref 6–20)
CO2: 30 mmol/L (ref 22–32)
Calcium: 9 mg/dL (ref 8.9–10.3)
Chloride: 95 mmol/L — ABNORMAL LOW (ref 98–111)
Creatinine, Ser: 1.77 mg/dL — ABNORMAL HIGH (ref 0.61–1.24)
GFR calc Af Amer: 53 mL/min — ABNORMAL LOW (ref >60)
GFR calc non Af Amer: 46 mL/min — ABNORMAL LOW (ref >60)
Glucose, Bld: 114 mg/dL — ABNORMAL HIGH (ref 70–99)
Potassium: 4 mmol/L (ref 3.5–5.1)
Sodium: 136 mmol/L (ref 135–145)
Total Bilirubin: 0.9 mg/dL (ref 0.3–1.2)
Total Protein: 8 g/dL (ref 6.5–8.1)

## 2020-03-29 LAB — CBC
HCT: 43.8 % (ref 39.0–52.0)
Hemoglobin: 16.2 g/dL (ref 13.0–17.0)
MCH: 33.3 pg (ref 26.0–34.0)
MCHC: 37 g/dL — ABNORMAL HIGH (ref 30.0–36.0)
MCV: 89.9 fL (ref 80.0–100.0)
Platelets: 138 10*3/uL — ABNORMAL LOW (ref 150–400)
RBC: 4.87 MIL/uL (ref 4.22–5.81)
RDW: 12 % (ref 11.5–15.5)
WBC: 3.9 10*3/uL — ABNORMAL LOW (ref 4.0–10.5)
nRBC: 0 % (ref 0.0–0.2)

## 2020-03-29 MED ORDER — SODIUM CHLORIDE 0.9 % IV SOLN
INTRAVENOUS | Status: DC | PRN
  Administered 2020-03-29 – 2020-03-30 (×2): 1000 mL via INTRAVENOUS

## 2020-03-29 MED ORDER — FAMOTIDINE IN NACL 20-0.9 MG/50ML-% IV SOLN
20.0000 mg | Freq: Two times a day (BID) | INTRAVENOUS | Status: DC
  Administered 2020-03-29 – 2020-03-31 (×5): 20 mg via INTRAVENOUS
  Filled 2020-03-29 (×5): qty 50

## 2020-03-29 MED ORDER — OXYCODONE HCL 5 MG PO TABS
5.0000 mg | ORAL_TABLET | ORAL | Status: DC | PRN
  Administered 2020-03-29 – 2020-03-31 (×5): 5 mg via ORAL
  Filled 2020-03-29 (×6): qty 1

## 2020-03-29 NOTE — ED Notes (Signed)
Hourly rounding reveals patient in room sitting on bed. Patient asked for pain medications, patient is stable, in no acute distress.

## 2020-03-29 NOTE — ED Notes (Signed)
Pt sleeping in bed at this time, NAD noted. Respirations even and unlabored. Will continue to monitor.

## 2020-03-29 NOTE — ED Notes (Signed)
Pt back to stretcher and placed on monitor and IV fluids. Lights off to enhance rest. Will continue to assess.

## 2020-03-29 NOTE — ED Notes (Signed)
Pt up to restroom. Gait steady. IV fluids paused.

## 2020-03-29 NOTE — Progress Notes (Signed)
PROGRESS NOTE    Oscar [name]  HAL:[id number] DOB: September 23, 1975 DOA: 03/28/2020 PCP: [facility]   Brief Narrative:  HPI: Oscar [name] is a 44 y.o. male with medical history significant of ischemic cardiomyopathy status post AICD placement, asthma, diabetes, hyperlipidemia, hypertension, morbid obesity, chronic pancreatitis, sleep apnea among other things who was seen about 2 weeks ago in the ER with possible CHF exacerbation.  Patient left AMA.  He has apparently been having significant epigastric pain which is persistent going to his back.  He was suspected to have either peptic ulcer disease or severe GERD.  Patient was sent to an outside gastroenterologist in [place] who wanted to do an EGD but needed cardiac clearance.  That has not been done yet.  Pain got worse tonight persistent.  Denied melena denied any bright red blood per rectum but is having nausea and occasional vomiting.  Denied chest pain.  Denied any significant shortness of breath above his baseline.  He came to the ER where he was seen and evaluated.  Patient being admitted to the hospital for symptom treatment and possible assessment for EGD..  7/29: Patient seen and examined in emergency department.  Hemodynamically stable.  Complaining of severe epigastric pain.  Not responding to viscous lidocaine or nonnarcotic therapy.  Seen by gastroenterology.  Upper GI series recommended.  Endoscopic evaluation not recommended at this time.   Assessment & Plan:   Principal Problem:   Abdominal pain Active Problems:   Hypertension   Chronic systolic heart failure (HCC)   Diabetes (HCC)   Tobacco use   COPD (chronic obstructive pulmonary disease) (HCC)   ARF (acute renal failure) (HCC)  Epigastric pain Likely due to severe GERD.  Difficult to exclude peptic ulcer disease.  Patient was previously recommended EGD however did not get cardiac clearance and this was not done.  Protonix and sucralfate  initiated in the emergency department. Plan: GI consulted, appreciate recommendations No immediate plans for inpatient endoscopy We will proceed with upper GI series Continue twice daily PPI Continue twice daily H2 blocker Continue sucralfate  Chronic systolic congestive heart failure EF 20% Status post AICD Coronary artery disease Patient was here 2 weeks ago and left AMA despite being recommended for cardiac catheterization.  He was recommended aspirin and Plavix however I do not believe he has been taking either of these.  Currently no indication of NSTEMI.  Will likely need to either inpatient versus outpatient cardiology evaluation in the near future.  Diuretics to be resumed.  Patient is normal volume status.  Continue telemetry monitoring.  Acute kidney injury Likely multifactorial.  Poor oral intake is driving factor.  Also also been on diuretics and ACE inhibitor's.  Kidney function responded and has returned to baseline.  Can resume previous treatment  COPD Baseline no oxygen requirement.  No evidence of acute exacerbation.  Diabetes mellitus On basal bolus regimen for home dose Continue sliding scale coverage and carb modified diet  Essential hypertension Controlled, continue home regimen  Tobacco abuse Offer nicotine patch and cessation counseling    DVT prophylaxis: Heparin code Status: Full Family Communication: None today  disposition Plan: Status is: Inpatient  Remains inpatient appropriate because:Inpatient level of care appropriate due to severity of illness   Dispo: The patient is from: Home              Anticipated d/c is to: Home              Anticipated d/c date  is: 2 days              Patient currently is not medically stable to d/c.         Consultants:   GI  Procedures:   UGI series  Antimicrobials:   None   Subjective: Patient seen and examined.  Complains of severe epigastric pain.  No other complaints  Objective: Vitals:    03/29/20 0700 03/29/20 0800 03/29/20 0936 03/29/20 1449  BP: (!) 138/76  (!) 139/96 (!) 108/87  Pulse: 78 61 64 69  Resp: 18 (!) 11 20 16   Temp:   (!) 97.5 F (36.4 C) 97.6 F (36.4 C)  TempSrc:   Oral Oral  SpO2: 100% 97% 95% 97%  Weight:      Height:        Intake/Output Summary (Last 24 hours) at 03/29/2020 1543 Last data filed at 03/28/2020 2321 Gross per 24 hour  Intake 547.05 ml  Output --  Net 547.05 ml   Filed Weights   03/28/20 1053  Weight: (!) 139.8 kg    Examination:  General exam: Appears calm and comfortable  Respiratory system: Clear to auscultation. Respiratory effort normal. Cardiovascular system: S1 & S2 heard, RRR. No JVD, murmurs, rubs, gallops or clicks. No pedal edema. Gastrointestinal system: Obese, tender to palpation epigastrium, nondistended.  Normal bowel sounds Central nervous system: Alert and oriented. No focal neurological deficits. Extremities: Symmetric 5 x 5 power. Skin: No rashes, lesions or ulcers Psychiatry: Judgement and insight appear normal. Mood & affect appropriate.     Data Reviewed: I have personally reviewed following labs and imaging studies  CBC: Recent Labs  Lab 03/28/20 1056 03/29/20 0617  WBC 4.1 3.9*  HGB 16.7 16.2  HCT 46.3 43.8  MCV 91.7 89.9  PLT 139* 176*   Basic Metabolic Panel: Recent Labs  Lab 03/28/20 1056 03/28/20 1710 03/29/20 0617  NA 133*  --  136  K 4.3  --  4.0  CL 94*  --  95*  CO2 28  --  30  GLUCOSE 161*  --  114*  BUN 33*  --  27*  CREATININE 2.17*  --  1.77*  CALCIUM 8.9  --  9.0  MG  --  1.2*  --    GFR: Estimated Creatinine Clearance: 71 mL/min (A) (by C-G formula based on SCr of 1.77 mg/dL (H)). Liver Function Tests: Recent Labs  Lab 03/28/20 1710 03/29/20 0617  AST 34 31  ALT 35 27  ALKPHOS 159* 135*  BILITOT 1.1 0.9  PROT 8.9* 8.0  ALBUMIN 4.3 3.7   Recent Labs  Lab 03/28/20 1056  LIPASE 63*   No results for input(s): AMMONIA in the last 168  hours. Coagulation Profile: No results for input(s): INR, PROTIME in the last 168 hours. Cardiac Enzymes: No results for input(s): CKTOTAL, CKMB, CKMBINDEX, TROPONINI in the last 168 hours. BNP (last 3 results) No results for input(s): PROBNP in the last 8760 hours. HbA1C: No results for input(s): HGBA1C in the last 72 hours. CBG: Recent Labs  Lab 03/28/20 2217 03/29/20 0815 03/29/20 0920 03/29/20 1311  GLUCAP 109* 105* 126* 114*   Lipid Profile: No results for input(s): CHOL, HDL, LDLCALC, TRIG, CHOLHDL, LDLDIRECT in the last 72 hours. Thyroid Function Tests: No results for input(s): TSH, T4TOTAL, FREET4, T3FREE, THYROIDAB in the last 72 hours. Anemia Panel: No results for input(s): VITAMINB12, FOLATE, FERRITIN, TIBC, IRON, RETICCTPCT in the last 72 hours. Sepsis Labs: No results for input(s): PROCALCITON, LATICACIDVEN in  the last 168 hours.  Recent Results (from the past 240 hour(s))  SARS Coronavirus 2 by RT PCR (hospital order, performed in [facility] hospital lab) Nasopharyngeal Nasopharyngeal Swab     Status: None   Collection Time: 03/28/20  7:38 PM   Specimen: Nasopharyngeal Swab  Result Value Ref Range Status   SARS Coronavirus 2 NEGATIVE NEGATIVE Final    Comment: (NOTE) SARS-CoV-2 target nucleic acids are NOT DETECTED.  The SARS-CoV-2 RNA is generally detectable in upper and lower respiratory specimens during the acute phase of infection. The lowest concentration of SARS-CoV-2 viral copies this assay can detect is 250 copies / mL. A negative result does not preclude SARS-CoV-2 infection and should not be used as the sole basis for treatment or other patient management decisions.  A negative result may occur with improper specimen collection / handling, submission of specimen other than nasopharyngeal swab, presence of viral mutation(s) within the areas targeted by this assay, and inadequate number of viral copies (<250 copies / mL). A negative result must be  combined with clinical observations, patient history, and epidemiological information.  Fact Sheet for Patients:   StrictlyIdeas.no  Fact Sheet for Healthcare Providers: BankingDealers.co.za  This test is not yet approved or  cleared by the [place] FDA and has been authorized for detection and/or diagnosis of SARS-CoV-2 by FDA under an Emergency Use Authorization (EUA).  This EUA will remain in effect (meaning this test can be used) for the duration of the COVID-19 declaration under Section 564(b)(1) of the Act, 21 U.S.C. section 360bbb-3(b)(1), unless the authorization is terminated or revoked sooner.  Performed at [facility], [address], [place], [place] [address]          Radiology Studies: DG Chest 2 View  Result Date: 03/28/2020 CLINICAL DATA:  Chest pain. EXAM: CHEST - 2 VIEW COMPARISON:  Prior chest radiographs 02/27/2020 and earlier. FINDINGS: Redemonstrated left chest multi lead implantable cardiac device. Unchanged cardiomegaly. There is no appreciable airspace consolidation or pulmonary edema. No evidence of pleural effusion or pneumothorax. No acute bony abnormality is identified. IMPRESSION: Unchanged cardiomegaly. No evidence of acute cardiopulmonary abnormality. Electronically Signed   By: Kellie Simmering DO   On: 03/28/2020 11:25   CT ABDOMEN PELVIS W CONTRAST  Result Date: 03/28/2020 CLINICAL DATA:  Acute abdominal pain nonlocalized. EXAM: CT ABDOMEN AND PELVIS WITH CONTRAST TECHNIQUE: Multidetector CT imaging of the abdomen and pelvis was performed using the standard protocol following bolus administration of intravenous contrast. CONTRAST:  127mL OMNIPAQUE IOHEXOL 300 MG/ML  SOLN COMPARISON:  CT abdomen pelvis 01/06/2020 FINDINGS: Lower chest: Lung bases clear bilaterally. The heart is enlarged with a pacemaker extending into the right ventricle. The left ventricle is significantly dilated.  Hepatobiliary: Course texture to the liver with enlargement left lobe and mild irregularity of the liver capsule suggesting cirrhosis. No liver lesion. Gallbladder and bile ducts within normal limits. Pancreas: Negative Spleen: Negative Adrenals/Urinary Tract: Adrenal glands are unremarkable. Kidneys are normal, without renal calculi, focal lesion, or hydronephrosis. Bladder is unremarkable. Stomach/Bowel: Negative for bowel obstruction. No bowel mass or edema. Normal appendix. Vascular/Lymphatic: Mild atherosclerotic aorta without aneurysm. 14 mm portacaval lymph node unchanged from prior studies. No other prominent lymph nodes identified. Reproductive: Mild prostate enlargement Other: Periumbilical hernia containing abdominal fat, unchanged from the prior study. No significant edema or fluid in the hernia. Musculoskeletal: Lumbar disc degeneration and spurring. No acute skeletal abnormality. IMPRESSION: No acute abnormality no change from the prior study. Findings suggestive of cirrhosis.  No ascites or mass. Portacaval lymph node stable. Periumbilical hernia containing fat unchanged. Electronically Signed   By: Franchot Gallo M.D.   On: 03/28/2020 18:39        Scheduled Meds: . alum & mag hydroxide-simeth  30 mL Oral TID AC & HS  . atorvastatin  40 mg Oral q1800  . clindamycin  1 application Topical BID  . gabapentin  300 mg Oral BID  . heparin  5,000 Units Subcutaneous Q8H  . insulin aspart  0-20 Units Subcutaneous TID WC  . insulin aspart  0-5 Units Subcutaneous QHS  . insulin detemir  15 Units Subcutaneous BID  . lipase/protease/amylase  24,000 Units Oral 5 X Daily  . pantoprazole (PROTONIX) IV  40 mg Intravenous Q12H  . sucralfate  1 g Oral TID WC & HS  . tiotropium  18 mcg Inhalation Daily  . torsemide  40 mg Oral Daily  . umeclidinium-vilanterol  1 puff Inhalation Q1500   Continuous Infusions: . famotidine (PEPCID) IV Stopped (03/29/20 1058)     LOS: 1 day    Time spent: 25  minutes    Sidney Ace, MD [facility] Pager [phone]  If 7PM-7AM, please contact night-coverage 03/29/2020, 3:43 PM

## 2020-03-29 NOTE — ED Notes (Addendum)
Pt desatting with this RN at bedside. Patient on RA at the time, patient states that he's on 3 L of O2 at home. Pt placed on 3 L o2, oxygen level increases immediately.

## 2020-03-29 NOTE — Consult Note (Addendum)
Vonda Antigua, MD [address], [place], [place], [place], [address] [address], [place], [place], [place], [address] Phone: [phone]  Fax: [phone]  Consultation  Referring Provider:     Dr. Priscella Mann Primary Care Physician:  [facility] [place] Reason for Consultation:     Abdominal pain Primary gastroenterologist: [facility]  Date of Admission:  03/28/2020 Date of Consultation:  03/29/2020         HPI:   Oscar [name] is a 44 y.o. male and recently presented to the ER on February 28, 2020 with chest pain and was started on heparin infusion for NSTEMI, with cardiac catheterization recommended.  However, patient left AMA.  GI is being consulted for abdominal pain.  Patient reports 3 weeks of epigastric pain not associated with any nausea or vomiting.  Dull, nonradiating, 5/10, present with or without meals.  Does report intermittent heartburn but states it is well controlled with Pepcid daily.  No dysphagia.  No weight loss.  No altered bowel habits or blood in stool.  No prior upper or lower endoscopy  Patient has had previous history of recurrent acute pancreatitis with unknown etiology.  Please see detailed outpatient GI notes from March 08, 2020 about this.  Patient denies any recent alcohol use.  States he used to drink daily as a teenager but has not since then.  In summary of HPI from primary gastroenterologist note, patient has been evaluated for causes of pancreatitis with noncontributory triglycerides and IgG4 levels.  At one point, torsemide was postulated as the cause but after discontinuing it, he continued to have episodes and this was restarted.  CTs have not shown any structural abnormalities.  Recently had an MRI MRCP ordered by the gastroenterologist with no etiology identified, see care everywhere.  CT on this admission reports a normal pancreas  Patient was recently evaluated by gastroenterology as an outpatient and EGD was  discussed but cardiology clearance was recommended prior to the procedure, and therefore the procedure has not been completed as of yet.  Past Medical History:  Diagnosis Date  . AICD (automatic cardioverter/defibrillator) present   . Asthma   . Cardiomyopathy ([place])   . CHF (congestive heart failure) ([place])   . Coronary artery disease   . Deafness in right ear   . Diabetes mellitus without complication ([place])   . Dilated cardiomyopathy ([place])   . Dysrhythmia    svt  . 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 GERD (gastroesophageal reflux disease)   . Hyperlipidemia   . Hypertension   . Hypoxemia   . Hypoxemia   . Mild obesity   . Myocardial infarction ([place])    9485,4627,0/35  . Pancreatitis   . Sleep apnea    osa    Past Surgical History:  Procedure Laterality Date  . CARDIAC CATHETERIZATION  12/11/2014   Procedure: RIGHT/LEFT HEART CATH AND CORONARY ANGIOGRAPHY;  Surgeon: Lorretta Harp, MD;  Location: [facility] CATH LAB;  Service: Cardiovascular;;  . ICD LEAD REMOVAL N/A 03/30/2015   Procedure: ICD LEAD REMOVAL;  Surgeon: Marzetta Board, MD;  Location: [facility] ORS;  Service: Cardiovascular;  Laterality: N/A;  . IMPLANTABLE CARDIOVERTER DEFIBRILLATOR IMPLANT    . INSERT / REPLACE / REMOVE PACEMAKER    . LEFT HEART CATHETERIZATION WITH CORONARY ANGIOGRAM N/A 12/09/2014   Procedure: LEFT HEART CATHETERIZATION WITH CORONARY ANGIOGRAM;  Surgeon: Burnell Blanks, MD;  Location: [facility] CATH LAB;  Service: Cardiovascular;  Laterality: N/A;  Prior to Admission medications   Medication Sig Start Date End Date Taking? Authorizing Provider  albuterol (VENTOLIN HFA) 108 (90 Base) MCG/ACT inhaler 2 inhalations every 6 (six) hours as needed 01/21/19  Yes [provider]  atorvastatin (LIPITOR) 40 MG tablet Take 1 tablet (40 mg total) by mouth daily at 6 PM. 11/04/18  Yes Hackney, Tina A, FNP  clindamycin (CLEOCIN T) 1 % external solution Apply 1 application topically 2 (two) times  daily.  02/16/20  Yes [provider]  clopidogrel (PLAVIX) 75 MG tablet Take 1 tablet (75 mg total) by mouth daily. 02/28/20  Yes Clabe Seal, PA-C  famotidine (PEPCID) 20 MG tablet Take 1 tablet (20 mg total) by mouth 2 (two) times daily. 05/18/18  Yes Carrie Mew, MD  gabapentin (NEURONTIN) 300 MG capsule Take 300 mg by mouth 2 (two) times daily.   Yes [provider]  insulin detemir (LEVEMIR FLEXTOUCH) 100 UNIT/ML FlexPen Inject 30 Units into the skin 2 (two) times daily.  11/10/18  Yes [provider]  lidocaine (XYLOCAINE) 2 % solution Use as directed 15 mLs in the mouth or throat every 6 (six) hours as needed (stomach pain). 12/29/19  Yes Nance Pear, MD  liraglutide (VICTOZA) 18 MG/3ML SOPN Inject 18 mg into the skin daily in the afternoon.   Yes [provider]  sacubitril-valsartan (ENTRESTO) 49-51 MG Take 1 tablet by mouth 2 (two) times daily.  06/13/19  Yes [provider]  tiotropium (SPIRIVA) 18 MCG inhalation capsule Place 18 mcg into inhaler and inhale daily.   Yes [provider]  torsemide (DEMADEX) 20 MG tablet Take 1 tablet (20 mg total) by mouth daily. Patient taking differently: Take 40 mg by mouth daily.  11/04/18  Yes Hackney, Tina A, FNP  umeclidinium-vilanterol (ANORO ELLIPTA) 62.5-25 MCG/INH AEPB Inhale 1 puff into the lungs daily in the afternoon.  01/31/19  Yes [provider]  ZENPEP 40000-126000 units CPEP Take 2-4 capsules by mouth 5 (five) times daily. 4CAPSULE-BREAKFAST 2CAPSULE-SNACK 4CAPSULE-LUNCH 2CAPSULE-SNACK 4CAPSULE-DINNER 03/20/20  Yes [provider]  aluminum-magnesium hydroxide-simethicone (MAALOX) 099-833-82 MG/5ML SUSP Take 30 mLs by mouth 4 (four) times daily -  before meals and at bedtime. Patient not taking: Reported on 03/28/2020 05/18/18   Carrie Mew, MD  aspirin EC 325 MG tablet Take 1 tablet (325 mg total) by mouth daily. Patient not taking: Reported on 03/28/2020  07/10/15   Aldean Jewett, MD  meloxicam (MOBIC) 15 MG tablet Take 1 tablet (15 mg total) by mouth daily. Patient not taking: Reported on 02/27/2020 01/26/20   [name], DPM  ondansetron (ZOFRAN ODT) 4 MG disintegrating tablet Take 1 tablet (4 mg total) by mouth every 8 (eight) hours as needed for nausea or vomiting. Patient not taking: Reported on 02/27/2020 01/06/20   Blake Divine, MD  oxyCODONE-acetaminophen (PERCOCET) 5-325 MG tablet Take 1 tablet by mouth every 4 (four) hours as needed for severe pain. Patient not taking: Reported on 02/27/2020 01/06/20 01/05/21  Blake Divine, MD  sucralfate (CARAFATE) 1 g tablet Take 1 tablet (1 g total) by mouth 4 (four) times daily. Patient not taking: Reported on 02/27/2020 12/29/19   Nance Pear, MD    Family History  Problem Relation Age of Onset  . Hypertension Mother   . Congestive Heart Failure Mother   . Hypertension Sister   . Diabetes Sister   . Pancreatitis Sister   . COPD Sister   . Pancreatitis Brother   . Anemia Neg Hx   .  Arrhythmia Neg Hx   . Asthma Neg Hx   . Clotting disorder Neg Hx   . Fainting Neg Hx   . Heart attack Neg Hx   . Heart disease Neg Hx   . Heart failure Neg Hx   . Hyperlipidemia Neg Hx      Social History   Tobacco Use  . Smoking status: Current Every Day Smoker    Packs/day: 0.50    Years: 24.00    Pack years: 12.00    Types: Cigarettes  . Smokeless tobacco: Never Used  . Tobacco comment: 4/5 Smoking 5 cigs a day  Vaping Use  . Vaping Use: Never used  Substance Use Topics  . Alcohol use: No  . Drug use: No    Allergies as of 03/28/2020 - Review Complete 03/28/2020  Allergen Reaction Noted  . Bidil [isosorb dinitrate-hydralazine] Other (See Comments) 04/15/2016  . Ciprofloxacin  12/18/2017    Review of Systems:    All systems reviewed and negative except where noted in HPI.   Physical Exam:  Vital signs in last 24 hours: Vitals:   03/29/20 0700 03/29/20 0800 03/29/20 0936  03/29/20 1449  BP: (!) 138/76  (!) 139/96 (!) 108/87  Pulse: 78 61 64 69  Resp: 18 (!) 11 20 16   Temp:   (!) 97.5 F (36.4 C) 97.6 F (36.4 C)  TempSrc:   Oral Oral  SpO2: 100% 97% 95% 97%  Weight:      Height:         General:   Pleasant, cooperative in NAD Head:  Normocephalic and atraumatic. Eyes:   No icterus.   Conjunctiva pink. PERRLA. Ears:  Normal auditory acuity. Neck:  Supple; no masses or thyroidomegaly Lungs: Respirations even and unlabored. Lungs clear to auscultation bilaterally.   No wheezes, crackles, or rhonchi.  Abdomen:  Soft, nondistended, nontender. Normal bowel sounds. No appreciable masses or hepatomegaly.  No rebound or guarding.  Neurologic:  Alert and oriented x3;  grossly normal neurologically. Skin:  Intact without significant lesions or rashes. Cervical Nodes:  No significant cervical adenopathy. Psych:  Alert and cooperative. Normal affect.  LAB RESULTS: Recent Labs    03/28/20 1056 03/29/20 0617  WBC 4.1 3.9*  HGB 16.7 16.2  HCT 46.3 43.8  PLT 139* 138*   BMET Recent Labs    03/28/20 1056 03/29/20 0617  NA 133* 136  K 4.3 4.0  CL 94* 95*  CO2 28 30  GLUCOSE 161* 114*  BUN 33* 27*  CREATININE 2.17* 1.77*  CALCIUM 8.9 9.0   LFT Recent Labs    03/28/20 1710 03/28/20 1710 03/29/20 0617  PROT 8.9*   < > 8.0  ALBUMIN 4.3   < > 3.7  AST 34   < > 31  ALT 35   < > 27  ALKPHOS 159*   < > 135*  BILITOT 1.1   < > 0.9  BILIDIR 0.2  --   --   IBILI 0.9  --   --    < > = values in this interval not displayed.   PT/INR No results for input(s): LABPROT, INR in the last 72 hours.  STUDIES: DG Chest 2 View  Result Date: 03/28/2020 CLINICAL DATA:  Chest pain. EXAM: CHEST - 2 VIEW COMPARISON:  Prior chest radiographs 02/27/2020 and earlier. FINDINGS: Redemonstrated left chest multi lead implantable cardiac device. Unchanged cardiomegaly. There is no appreciable airspace consolidation or pulmonary edema. No evidence of pleural effusion  or pneumothorax. No acute  bony abnormality is identified. IMPRESSION: Unchanged cardiomegaly. No evidence of acute cardiopulmonary abnormality. Electronically Signed   By: Kellie Simmering DO   On: 03/28/2020 11:25   CT ABDOMEN PELVIS W CONTRAST  Result Date: 03/28/2020 CLINICAL DATA:  Acute abdominal pain nonlocalized. EXAM: CT ABDOMEN AND PELVIS WITH CONTRAST TECHNIQUE: Multidetector CT imaging of the abdomen and pelvis was performed using the standard protocol following bolus administration of intravenous contrast. CONTRAST:  151mL OMNIPAQUE IOHEXOL 300 MG/ML  SOLN COMPARISON:  CT abdomen pelvis 01/06/2020 FINDINGS: Lower chest: Lung bases clear bilaterally. The heart is enlarged with a pacemaker extending into the right ventricle. The left ventricle is significantly dilated. Hepatobiliary: Course texture to the liver with enlargement left lobe and mild irregularity of the liver capsule suggesting cirrhosis. No liver lesion. Gallbladder and bile ducts within normal limits. Pancreas: Negative Spleen: Negative Adrenals/Urinary Tract: Adrenal glands are unremarkable. Kidneys are normal, without renal calculi, focal lesion, or hydronephrosis. Bladder is unremarkable. Stomach/Bowel: Negative for bowel obstruction. No bowel mass or edema. Normal appendix. Vascular/Lymphatic: Mild atherosclerotic aorta without aneurysm. 14 mm portacaval lymph node unchanged from prior studies. No other prominent lymph nodes identified. Reproductive: Mild prostate enlargement Other: Periumbilical hernia containing abdominal fat, unchanged from the prior study. No significant edema or fluid in the hernia. Musculoskeletal: Lumbar disc degeneration and spurring. No acute skeletal abnormality. IMPRESSION: No acute abnormality no change from the prior study. Findings suggestive of cirrhosis. No ascites or mass. Portacaval lymph node stable. Periumbilical hernia containing fat unchanged. Electronically Signed   By: Franchot Gallo M.D.   On:  03/28/2020 18:39      Impression / Plan:   Oscar [name] is a 44 y.o. 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 despite thorough work-up, with no evidence of pancreatitis on CT scan on this admission  Given patient's recent NSTEMI, with troponin of 6346 on last admission where he left AMA, and cardiac work-up was thus not completed, (in addition to his history of ischemic cardiomyopathy with low EF) endoscopic procedures are not advised as it would have higher risks than benefits in this setting  Patient does not have any evidence of active bleeding or alarm symptoms to indicate urgent endoscopy  Would recommend upper GI study as that would be a noninvasive way of ruling out any large lesions  Epigastric pain may be related to gastritis, dyspepsia, esophagitis, GERD, and these can be managed medically at this time  Would recommend PPI twice daily H. pylori serology and treatment if positive  Given his history of recurrent acute pancreatitis, he was started on Creon as an outpatient.  Continue as inpatient as this could be leading to chronic abdominal pain symptoms as well.  Titrating this may be needed also as an outpatient depending on signs and symptoms.  If symptoms do not resolve, upper endoscopy can be considered as an inpatient or outpatient depending on clinical status and improvement of symptoms  Recommend cardiology work-up as per primary team and cardiology as necessary  Follow-up with primary GI as an outpatient for history of recurrent acute pancreatitis  Avoid NSAIDs   Thank you for involving me in the care of this patient.      LOS: 1 day   Virgel Manifold, MD  03/29/2020, 2:51 PM

## 2020-03-29 NOTE — ED Notes (Signed)
Pharmacy called to verify medications at this time, messaged to send levimir which is already verified.

## 2020-03-30 DIAGNOSIS — R1013 Epigastric pain: Secondary | ICD-10-CM | POA: Diagnosis not present

## 2020-03-30 LAB — GLUCOSE, CAPILLARY
Glucose-Capillary: 111 mg/dL — ABNORMAL HIGH (ref 70–99)
Glucose-Capillary: 159 mg/dL — ABNORMAL HIGH (ref 70–99)
Glucose-Capillary: 104 mg/dL — ABNORMAL HIGH (ref 70–99)
Glucose-Capillary: 117 mg/dL — ABNORMAL HIGH (ref 70–99)

## 2020-03-30 LAB — H PYLORI, IGM, IGG, IGA AB
H Pylori IgG: 0.57 Index Value (ref 0.00–0.79)
H. Pylogi, Iga Abs: <9 units (ref 0.0–8.9)
H. Pylogi, Igm Abs: <9 units (ref 0.0–8.9)

## 2020-03-30 MED ORDER — ENSURE MAX PROTEIN PO LIQD
11.0000 [oz_av] | Freq: Two times a day (BID) | ORAL | Status: DC
  Administered 2020-03-30 – 2020-03-31 (×3): 11 [oz_av] via ORAL
  Filled 2020-03-30: qty 330

## 2020-03-30 MED ORDER — ACETAMINOPHEN 325 MG PO TABS: 650.0000 mg | ORAL_TABLET | Freq: Four times a day (QID) | ORAL | Status: DC | PRN

## 2020-03-30 NOTE — Consult Note (Signed)
[facility] Cardiology Consultation Note  Patient ID: Oscar [name], MRN: [id number], DOB/AGE: Aug 21, 1976 44 y.o. Admit date: 03/28/2020   Date of Consult: 03/30/2020 Primary Physician: [facility] [place] Primary Cardiologist: [name]  Chief Complaint:  Chief Complaint  Patient presents with  . Abdominal Pain  . Chest Pain   Reason for Consult: Abdominal discomfort with known cardiovascular disease cardiomyopathy  HPI: 44 y.o. 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  The patient appears to not be able to tolerate other medication management including beta-blocker ACE inhibitor or Entresto at this time.  The patient has had a stress test last year showing no evidence of myocardial ischemia with an echocardiogram showing ejection fraction of 15% with moderate mitral and tricuspid regurgitation.  He has had cardiac catheterization in 2016 with no coronary artery atherosclerosis.  He has chronic kidney disease stage III with a glomerular filtration rate of 47 and has had continued episodes of chest discomfort which was radiating from his abdomen.  CT scan of his abdomen and MRI of his abdomen has shown no evidence of primary source of his symptoms.  The patient has had no EKG changes in the last several weeks with an EKG showing normal sinus rhythm left atrial enlargement with left bundle branch block.  He was admitted to [facility] for further work-up of these things and did not have a primary source.  At that time the patient had troponin levels of 65-70 and flat without evidence of acute coronary syndrome.  On admission here he has a chest x-ray without evidence of pulmonary edema a troponin of 95 and flat BNP of 246 and an unchanged EKG.  Therefore it currently does not appear that he has had" acute coronary syndrome and or true unstable angina or congestive heart  failure with pulmonary edema at this time.  With a abdominal MRI and CAT scan showing no recent issues it would appear that there may not be a primary source of the symptoms at this time.  If needing further evaluation it would be reasonable to perform upper endoscopy for further evaluation  Past Medical History:  Diagnosis Date  . AICD (automatic cardioverter/defibrillator) present   . Asthma   . Cardiomyopathy ([place])   . CHF (congestive heart failure) ([place])   . Coronary artery disease   . Deafness in right ear   . Diabetes mellitus without complication ([place])   . Dilated cardiomyopathy ([place])   . Dysrhythmia    svt  . 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 GERD (gastroesophageal reflux disease)   . Hyperlipidemia   . Hypertension   . Hypoxemia   . Hypoxemia   . Mild obesity   . Myocardial infarction ([place])    [id number],6/76  . Pancreatitis   . Sleep apnea    osa      Surgical History:  Past Surgical History:  Procedure Laterality Date  . CARDIAC CATHETERIZATION  12/11/2014   Procedure: RIGHT/LEFT HEART CATH AND CORONARY ANGIOGRAPHY;  Surgeon: Lorretta Harp, MD;  Location: [facility] CATH LAB;  Service: Cardiovascular;;  . ICD LEAD REMOVAL N/A 03/30/2015   Procedure: ICD LEAD REMOVAL;  Surgeon: Marzetta Board, MD;  Location: [facility] ORS;  Service: Cardiovascular;  Laterality: N/A;  . IMPLANTABLE CARDIOVERTER DEFIBRILLATOR IMPLANT    . INSERT / REPLACE / REMOVE PACEMAKER    . LEFT HEART CATHETERIZATION WITH CORONARY ANGIOGRAM N/A 12/09/2014  Procedure: LEFT HEART CATHETERIZATION WITH CORONARY ANGIOGRAM;  Surgeon: Burnell Blanks, MD;  Location: [facility] CATH LAB;  Service: Cardiovascular;  Laterality: N/A;     Home Meds: Prior to Admission medications   Medication Sig Start Date End Date Taking? Authorizing Provider  albuterol (VENTOLIN HFA) 108 (90 Base) MCG/ACT inhaler 2 inhalations every 6 (six) hours as needed 01/21/19  Yes [provider]  atorvastatin (LIPITOR) 40  MG tablet Take 1 tablet (40 mg total) by mouth daily at 6 PM. 11/04/18  Yes Hackney, Tina A, FNP  clindamycin (CLEOCIN T) 1 % external solution Apply 1 application topically 2 (two) times daily.  02/16/20  Yes [provider]  clopidogrel (PLAVIX) 75 MG tablet Take 1 tablet (75 mg total) by mouth daily. 02/28/20  Yes Clabe Seal, PA-C  famotidine (PEPCID) 20 MG tablet Take 1 tablet (20 mg total) by mouth 2 (two) times daily. 05/18/18  Yes Carrie Mew, MD  gabapentin (NEURONTIN) 300 MG capsule Take 300 mg by mouth 2 (two) times daily.   Yes [provider]  insulin detemir (LEVEMIR FLEXTOUCH) 100 UNIT/ML FlexPen Inject 30 Units into the skin 2 (two) times daily.  11/10/18  Yes [provider]  lidocaine (XYLOCAINE) 2 % solution Use as directed 15 mLs in the mouth or throat every 6 (six) hours as needed (stomach pain). 12/29/19  Yes Nance Pear, MD  liraglutide (VICTOZA) 18 MG/3ML SOPN Inject 18 mg into the skin daily in the afternoon.   Yes [provider]  sacubitril-valsartan (ENTRESTO) 49-51 MG Take 1 tablet by mouth 2 (two) times daily.  06/13/19  Yes [provider]  tiotropium (SPIRIVA) 18 MCG inhalation capsule Place 18 mcg into inhaler and inhale daily.   Yes [provider]  torsemide (DEMADEX) 20 MG tablet Take 1 tablet (20 mg total) by mouth daily. Patient taking differently: Take 40 mg by mouth daily.  11/04/18  Yes Hackney, Tina A, FNP  umeclidinium-vilanterol (ANORO ELLIPTA) 62.5-25 MCG/INH AEPB Inhale 1 puff into the lungs daily in the afternoon.  01/31/19  Yes [provider]  ZENPEP 40000-126000 units CPEP Take 2-4 capsules by mouth 5 (five) times daily. 4CAPSULE-BREAKFAST 2CAPSULE-SNACK 4CAPSULE-LUNCH 2CAPSULE-SNACK 4CAPSULE-DINNER 03/20/20  Yes [provider]  aluminum-magnesium hydroxide-simethicone (MAALOX) 604-540-98 MG/5ML SUSP Take 30 mLs by mouth 4 (four) times daily -  before meals and at  bedtime. Patient not taking: Reported on 03/28/2020 05/18/18   Carrie Mew, MD  aspirin EC 325 MG tablet Take 1 tablet (325 mg total) by mouth daily. Patient not taking: Reported on 03/28/2020 07/10/15   Aldean Jewett, MD  meloxicam (MOBIC) 15 MG tablet Take 1 tablet (15 mg total) by mouth daily. Patient not taking: Reported on 02/27/2020 01/26/20   [name], DPM  ondansetron (ZOFRAN ODT) 4 MG disintegrating tablet Take 1 tablet (4 mg total) by mouth every 8 (eight) hours as needed for nausea or vomiting. Patient not taking: Reported on 02/27/2020 01/06/20   Blake Divine, MD  oxyCODONE-acetaminophen (PERCOCET) 5-325 MG tablet Take 1 tablet by mouth every 4 (four) hours as needed for severe pain. Patient not taking: Reported on 02/27/2020 01/06/20 01/05/21  Blake Divine, MD  sucralfate (CARAFATE) 1 g tablet Take 1 tablet (1 g total) by mouth 4 (four) times daily. Patient not taking: Reported on 02/27/2020 12/29/19   Nance Pear, MD    Inpatient Medications:  . alum & mag hydroxide-simeth  30 mL Oral TID AC & HS  . atorvastatin  40 mg Oral  q1800  . clindamycin  1 application Topical BID  . gabapentin  300 mg Oral BID  . heparin  5,000 Units Subcutaneous Q8H  . insulin aspart  0-20 Units Subcutaneous TID WC  . insulin aspart  0-5 Units Subcutaneous QHS  . insulin detemir  15 Units Subcutaneous BID  . lipase/protease/amylase  24,000 Units Oral 5 X Daily  . pantoprazole (PROTONIX) IV  40 mg Intravenous Q12H  . Ensure Max Protein  11 oz Oral BID  . sucralfate  1 g Oral TID WC & HS  . torsemide  40 mg Oral Daily  . umeclidinium-vilanterol  1 puff Inhalation Q1500   . sodium chloride Stopped (03/29/20 2346)  . famotidine (PEPCID) IV 20 mg (03/30/20 6659)    Allergies:  Allergies  Allergen Reactions  . Bidil [Isosorb Dinitrate-Hydralazine] Other (See Comments)    Migraine Headache Migraine Headache  . Ciprofloxacin     Social History   Socioeconomic History  . Marital  status: Single    Spouse name: Not on file  . Number of children: Not on file  . Years of education: Not on file  . Highest education level: Not on file  Occupational History  . Occupation: unemployed  Tobacco Use  . Smoking status: Current Every Day Smoker    Packs/day: 0.50    Years: 24.00    Pack years: 12.00    Types: Cigarettes  . Smokeless tobacco: Never Used  . Tobacco comment: 4/5 Smoking 5 cigs a day  Vaping Use  . Vaping Use: Never used  Substance and Sexual Activity  . Alcohol use: No  . Drug use: No  . Sexual activity: Yes  Other Topics Concern  . Not on file  Social History Narrative  . Not on file   Social Determinants of Health   Financial Resource Strain:   . Difficulty of Paying Living Expenses:   Food Insecurity:   . Worried About Charity fundraiser in the Last Year:   . Arboriculturist in the Last Year:   Transportation Needs:   . Film/video editor (Medical):   Marland Kitchen Lack of Transportation (Non-Medical):   Physical Activity:   . Days of Exercise per Week:   . Minutes of Exercise per Session:   Stress:   . Feeling of Stress :   Social Connections:   . Frequency of Communication with Friends and Family:   . Frequency of Social Gatherings with Friends and Family:   . Attends Religious Services:   . Active Member of Clubs or Organizations:   . Attends Archivist Meetings:   Marland Kitchen Marital Status:   Intimate Partner Violence:   . Fear of Current or Ex-Partner:   . Emotionally Abused:   Marland Kitchen Physically Abused:   . Sexually Abused:      Family History  Problem Relation Age of Onset  . Hypertension Mother   . Congestive Heart Failure Mother   . Hypertension Sister   . Diabetes Sister   . Pancreatitis Sister   . COPD Sister   . Pancreatitis Brother   . Anemia Neg Hx   . Arrhythmia Neg Hx   . Asthma Neg Hx   . Clotting disorder Neg Hx   . Fainting Neg Hx   . Heart attack Neg Hx   . Heart disease Neg Hx   . Heart failure Neg Hx   .  Hyperlipidemia Neg Hx      Review of Systems Positive for abdominal discomfort  nausea Negative for: General:  chills, fever, night sweats or weight changes.  Cardiovascular: PND orthopnea syncope dizziness  Dermatological skin lesions rashes Respiratory: Cough congestion Urologic: Frequent urination urination at night and hematuria Abdominal: Positive for nausea, negative for vomiting, diarrhea, bright red blood per rectum, melena, or hematemesis Neurologic: negative for visual changes, and/or hearing changes  All other systems reviewed and are otherwise negative except as noted above.  Labs: No results for input(s): CKTOTAL, CKMB, TROPONINI in the last 72 hours. Lab Results  Component Value Date   WBC 3.9 (L) 03/29/2020   HGB 16.2 03/29/2020   HCT 43.8 03/29/2020   MCV 89.9 03/29/2020   PLT 138 (L) 03/29/2020    Recent Labs  Lab 03/29/20 0617  NA 136  K 4.0  CL 95*  CO2 30  BUN 27*  CREATININE 1.77*  CALCIUM 9.0  PROT 8.0  BILITOT 0.9  ALKPHOS 135*  ALT 27  AST 31  GLUCOSE 114*   Lab Results  Component Value Date   CHOL 247 (H) 09/12/2016   HDL 31 (L) 09/12/2016   LDLCALC 177 (H) 09/12/2016   TRIG 157 (H) 05/19/2017   No results found for: DDIMER  Radiology/Studies:  DG Chest 2 View  Result Date: 03/28/2020 CLINICAL DATA:  Chest pain. EXAM: CHEST - 2 VIEW COMPARISON:  Prior chest radiographs 02/27/2020 and earlier. FINDINGS: Redemonstrated left chest multi lead implantable cardiac device. Unchanged cardiomegaly. There is no appreciable airspace consolidation or pulmonary edema. No evidence of pleural effusion or pneumothorax. No acute bony abnormality is identified. IMPRESSION: Unchanged cardiomegaly. No evidence of acute cardiopulmonary abnormality. Electronically Signed   By: Kellie Simmering DO   On: 03/28/2020 11:25   CT ABDOMEN PELVIS W CONTRAST  Result Date: 03/28/2020 CLINICAL DATA:  Acute abdominal pain nonlocalized. EXAM: CT ABDOMEN AND PELVIS WITH  CONTRAST TECHNIQUE: Multidetector CT imaging of the abdomen and pelvis was performed using the standard protocol following bolus administration of intravenous contrast. CONTRAST:  17mL OMNIPAQUE IOHEXOL 300 MG/ML  SOLN COMPARISON:  CT abdomen pelvis 01/06/2020 FINDINGS: Lower chest: Lung bases clear bilaterally. The heart is enlarged with a pacemaker extending into the right ventricle. The left ventricle is significantly dilated. Hepatobiliary: Course texture to the liver with enlargement left lobe and mild irregularity of the liver capsule suggesting cirrhosis. No liver lesion. Gallbladder and bile ducts within normal limits. Pancreas: Negative Spleen: Negative Adrenals/Urinary Tract: Adrenal glands are unremarkable. Kidneys are normal, without renal calculi, focal lesion, or hydronephrosis. Bladder is unremarkable. Stomach/Bowel: Negative for bowel obstruction. No bowel mass or edema. Normal appendix. Vascular/Lymphatic: Mild atherosclerotic aorta without aneurysm. 14 mm portacaval lymph node unchanged from prior studies. No other prominent lymph nodes identified. Reproductive: Mild prostate enlargement Other: Periumbilical hernia containing abdominal fat, unchanged from the prior study. No significant edema or fluid in the hernia. Musculoskeletal: Lumbar disc degeneration and spurring. No acute skeletal abnormality. IMPRESSION: No acute abnormality no change from the prior study. Findings suggestive of cirrhosis. No ascites or mass. Portacaval lymph node stable. Periumbilical hernia containing fat unchanged. Electronically Signed   By: Franchot Gallo M.D.   On: 03/28/2020 18:39   DG UGI W SINGLE CM (SOL OR THIN BA)  Result Date: 03/29/2020 CLINICAL DATA:  Abdominal pain EXAM: UPPER GI SERIES WITHOUT KUB TECHNIQUE: Routine double-contrast upper GI series was performed with thick and thin barium and effervescent crystals. FLUOROSCOPY TIME:  Fluoroscopy Time:  1.6 minutes Radiation Exposure Index (if provided  by the fluoroscopic device): 71.5 mGy Number of  Acquired Spot Images: 19 COMPARISON:  CT 03/28/2020 FINDINGS: The esophageal motility is within normal limits. There is no evidence of stricture, mass, or ulceration. Rapid sequence imaging of the esophagus in the AP and lateral projections demonstrates no mucosal abnormalities. The stomach distends normally without evidence of mass, ulceration, or stricture. Gastroesophageal junction is normally located without hiatal hernia. Duodenal bulb and duodenal sweep are normal in appearance and positioning without malrotation. Mild spontaneous gastroesophageal reflux into the distal esophagus on supine positioning. IMPRESSION: Mild spontaneous gastroesophageal reflux. Otherwise unremarkable double-contrast upper GI examination. No mucosal abnormality or ulceration. Electronically Signed   By: Davina Poke D.O.   On: 03/29/2020 16:30    EKG: Normal sinus rhythm with left atrial enlargement and left bundle branch block  Weights: Filed Weights   03/28/20 1053  Weight: (!) 139.8 kg     Physical Exam: Blood pressure (!) 85/67, pulse 72, temperature 97.7 F (36.5 C), temperature source Oral, resp. rate 20, height 5\' 6"  (1.676 m), weight (!) 139.8 kg, SpO2 98 %. Body mass index is 49.75 kg/m. General: Well developed, well nourished, in no acute distress. Head eyes ears nose throat: Normocephalic, atraumatic, sclera non-icteric, no xanthomas, nares are without discharge. No apparent thyromegaly and/or mass  Lungs: Normal respiratory effort.  Few wheezes, no rales, no rhonchi.  Heart: RRR with normal S1 S2. no murmur gallop, no rub, PMI is normal size and placement, carotid upstroke normal without bruit, jugular venous pressure is normal Abdomen: Soft,  tender, distended with normoactive bowel sounds. No hepatomegaly. No rebound/guarding. No obvious abdominal masses. Abdominal aorta is normal size without bruit Extremities: Trace edema. no cyanosis, no  clubbing, no ulcers  Peripheral : 2+ bilateral upper extremity pulses, 2+ bilateral femoral pulses, 2+ bilateral dorsal pedal pulse Neuro: Alert and oriented. No facial asymmetry. No focal deficit. Moves all extremities spontaneously. Musculoskeletal: Normal muscle tone without kyphosis Psych:  Responds to questions appropriately with a normal affect.    Assessment: 44 year old male with severe nonischemic dilated cardiomyopathy with ejection fraction of 15% with a previous cardiac catheterization showing normal coronary arteries a recent stress test last year showing no evidence of myocardial ischemia status post ICD placement with diffuse abdominal symptoms and nausea radiating into his chest without evidence of congestive heart failure pulmonary edema true angina and/or acute coronary syndrome at this time  Plan: 1.  Continue supportive care for abdominal discomfort as per medicine and/or GI 2.  No further cardiac diagnostics necessary at this time due to no current evidence of acute congestive heart failure or acute coronary syndrome with previous history of nonischemic dilated cardiomyopathy and no ischemia on stress test with no significant elevation of troponins 3.  Continuation of daily use of torsemide for fluid balances 4.  No additional Entresto or beta-blocker due to hypotension and side effects of those medications 5.  Would consider dapagliflozin if able possibly has an outpatient if able 6.  Patient is at lowest risk possible at this time for further evaluation of abdominal discomfort by gastroenterology and upper endoscopy if needed 7.  Further treatment options after above  Signed, Corey Skains M.D. [facility] 03/30/2020, 12:31 PM

## 2020-03-30 NOTE — Progress Notes (Signed)
Initial Nutrition Assessment  DOCUMENTATION CODES:   Morbid obesity  INTERVENTION:   Ensure Max protein supplement BID, each supplement provides 150kcal and 30g of protein.  NUTRITION DIAGNOSIS:   Increased nutrient needs related to chronic illness (COPD) as evidenced by increased estimated needs.  GOAL:   Patient will meet greater than or equal to 90% of their needs  MONITOR:   PO intake, Supplement acceptance, Labs, Weight trends, Skin, I & O's  REASON FOR ASSESSMENT:   Malnutrition Screening Tool    ASSESSMENT:   44 y.o. male with medical history significant for chronic systolic heart failure, dilated cardiomyopathy status post AICD insertion ( LVEF 20 - 25%), obesity, coronary artery disease, COPD, diabetes mellitus, recurrent acute pancreatitis on creon and obstructive sleep apnea as well as recent admission last month for chest pain with discharge AMA after plan for cardiac catheter presents for assessment of approximately 3 weeks of worsening epigastric abdominal pain   Met with pt in room today. Pt reports poor appetite and oral intake for 4 weeks pta. Pt reports weight loss; pt reports that he weighed 343lbs ~4 weeks ago. Per chart, pt weighed 139kg in 05/2019, 139.7kg in 12/2019, 139.7lbs in 12/2019 and 139.8lbs in June 2021. Pt appears weight stable pta. Pt ate 80% of his breakfast this morning. RD will add supplements to help pt meet his estimated needs (pt prefers chocolate).   Medications reviewed and include: heparin, insulin, creon, protonix, carafate, torsemide, pepcid  Labs reviewed: BUN 27(H), creat 1.77(H), Mg 1.2(L) Wbc- 3.9(L)  NUTRITION - FOCUSED PHYSICAL EXAM:    Most Recent Value  Orbital Region No depletion  Upper Arm Region No depletion  Thoracic and Lumbar Region No depletion  Buccal Region No depletion  Temple Region No depletion  Clavicle Bone Region No depletion  Clavicle and Acromion Bone Region No depletion  Scapular Bone Region No  depletion  Dorsal Hand No depletion  Patellar Region No depletion  Anterior Thigh Region No depletion  Posterior Calf Region No depletion  Edema (RD Assessment) None  Hair Reviewed  Eyes Reviewed  Mouth Reviewed  Skin Reviewed  Nails Reviewed     Diet Order:   Diet Order            DIET SOFT Room service appropriate? Yes; Fluid consistency: Thin  Diet effective now                EDUCATION NEEDS:   Education needs have been addressed  Skin:  Skin Assessment: Reviewed RN Assessment  Last BM:  pta  Height:   Ht Readings from Last 1 Encounters:  03/28/20 5' 6"  (1.676 m)    Weight:   Wt Readings from Last 1 Encounters:  03/28/20 (!) 139.8 kg    Ideal Body Weight:  64.54 kg  BMI:  Body mass index is 49.75 kg/m.  Estimated Nutritional Needs:   Kcal:  2700-3000kcal/day  Protein:  >135g/day  Fluid:  2-2.3L/day  Koleen Distance MS, RD, LDN Please refer to [facility] for RD and/or RD on-call/weekend/after hours pager

## 2020-03-30 NOTE — Care Management Important Message (Signed)
Important Message  Patient Details  Name: Oscar [name] MRN: [id number] Date of Birth: [birth year]   Medicare Important Message Given:  Yes  Initial Medicare IM given by Patient Access Associate on 03/30/2020 at 10:40am.      Dannette Barbara 03/30/2020, 11:47 AM

## 2020-03-30 NOTE — Progress Notes (Signed)
PROGRESS NOTE    Oscar [name]  ZCH:[id number] DOB: 08/04/1976 DOA: 03/28/2020 PCP: [facility]   Brief Narrative:  HPI: Oscar [name] is a 44 y.o. male with medical history significant of ischemic cardiomyopathy status post AICD placement, asthma, diabetes, hyperlipidemia, hypertension, morbid obesity, chronic pancreatitis, sleep apnea among other things who was seen about 2 weeks ago in the ER with possible CHF exacerbation.  Patient left AMA.  He has apparently been having significant epigastric pain which is persistent going to his back.  He was suspected to have either peptic ulcer disease or severe GERD.  Patient was sent to an outside gastroenterologist in [place] who wanted to do an EGD but needed cardiac clearance.  That has not been done yet.  Pain got worse tonight persistent.  Denied melena denied any bright red blood per rectum but is having nausea and occasional vomiting.  Denied chest pain.  Denied any significant shortness of breath above his baseline.  He came to the ER where he was seen and evaluated.  Patient being admitted to the hospital for symptom treatment and possible assessment for EGD..  7/29: Patient seen and examined in emergency department.  Hemodynamically stable.  Complaining of severe epigastric pain.  Not responding to viscous lidocaine or nonnarcotic therapy.  Seen by gastroenterology.  Upper GI series recommended.  Endoscopic evaluation not recommended at this time.  7/30: Patient seen and examined.  Reports that epigastric pain is improved.  Upper GI series negative for any masses or lesions.  Demonstrates only mild reflux.  GI signed off.  Seen by cardiology.  Risk assessment completed.  Currently no plans for inpatient endoscopic evaluation.  Assessment & Plan:   Principal Problem:   Abdominal pain Active Problems:   Hypertension   Chronic systolic heart failure (HCC)   Diabetes (HCC)   Tobacco use   COPD (chronic  obstructive pulmonary disease) (HCC)   ARF (acute renal failure) (HCC)  Epigastric pain Likely due to severe GERD.  Difficult to exclude peptic ulcer disease.  Patient was previously recommended EGD however did not get cardiac clearance and this was not done.  Protonix and sucralfate initiated in the emergency department. -Upper GI series negative -GI signed off -Cardiology consulted for clearance.  No opposition from their standpoint for endoscopic evaluation Plan: No immediate plans for inpatient endoscopy Continue twice daily PPI Continue twice daily H2 blocker Continue sucralfate Pain continues to improve on above regimen can possibly discharge home on 03/31/2020 with plans to follow-up with outpatient GI in 2 to 3 weeks  Chronic systolic congestive heart failure EF 20% Status post AICD Coronary artery disease Patient was here 2 weeks ago and left AMA despite being recommended for cardiac catheterization.  He was recommended aspirin and Plavix however I do not believe he has been taking either of these.  Currently no indication of NSTEMI.  Will likely need to either inpatient versus outpatient cardiology evaluation in the near future.  Diuretics to be resumed.  Patient is normal volume status.  Continue telemetry monitoring. -Cardiology consulted.  No further inpatient cardiac diagnostics.  Continue current medication regimen.  No opposition cardiac standpoint for endoscopic evaluation should 1 be indicated  Acute kidney injury Likely multifactorial.  Poor oral intake is driving factor.  Also also been on diuretics and ACE inhibitor's.  Kidney function responded and has returned to baseline.  Can resume previous treatment  COPD Baseline no oxygen requirement.  No evidence of acute exacerbation.  Diabetes mellitus  On basal bolus regimen for home dose Continue sliding scale coverage and carb modified diet  Essential hypertension Controlled, continue home regimen  Tobacco  abuse Offer nicotine patch and cessation counseling    DVT prophylaxis: Heparin code Status: Full Family Communication: None today  disposition Plan: Status is: Inpatient  Remains inpatient appropriate because:Inpatient level of care appropriate due to severity of illness   Dispo: The patient is from: Home              Anticipated d/c is to: Home              Anticipated d/c date is: 1 day              Patient currently is not medically stable to d/c.   Patient abdominal pain beginning to improve.  No plans for patient endoscopic evaluation.  We need to them ensure that patient is able to tolerate p.o. without significant pain prior to discharge.  Multimodal reflux control approach.  Tentative plan to discharge home on 03/31/2020   Consultants:   GI  Procedures:   UGI series  Antimicrobials:   None   Subjective: Patient seen and examined.  Complains of fatigue and epigastric pain, improved prior  Objective: Vitals:   03/30/20 0007 03/30/20 0423 03/30/20 0737 03/30/20 1150  BP: (!) 105/89 112/69 109/76 (!) 85/67  Pulse: 75 61 87 72  Resp:  17 16 20   Temp: 98.3 F (36.8 C) 98.1 F (36.7 C) 97.7 F (36.5 C)   TempSrc: Oral Oral Oral   SpO2: 90% 97% 94% 98%  Weight:      Height:        Intake/Output Summary (Last 24 hours) at 03/30/2020 1403 Last data filed at 03/30/2020 0951 Gross per 24 hour  Intake 99.14 ml  Output --  Net 99.14 ml   Filed Weights   03/28/20 1053  Weight: (!) 139.8 kg    Examination:  General: No apparent distress, patient appears well HEENT: Normocephalic, atraumatic Neck, supple, trachea midline, no tenderness Heart: Regular rate and rhythm, S1/S2 normal, no murmurs Lungs: Clear to auscultation bilaterally, no adventitious sounds, normal work of breathing Abdomen: Obese, tender to palpation in epigastrium and left upper quadrant.  Nondistended.  Normal bowel sounds Extremities: Normal, atraumatic, no clubbing or cyanosis, normal  muscle tone Skin: No rashes or lesions, normal color Neurologic: Cranial nerves grossly intact, sensation intact, alert and oriented x3 Psychiatric: Normal affect      Data Reviewed: I have personally reviewed following labs and imaging studies  CBC: Recent Labs  Lab 03/28/20 1056 03/29/20 0617  WBC 4.1 3.9*  HGB 16.7 16.2  HCT 46.3 43.8  MCV 91.7 89.9  PLT 139* 357*   Basic Metabolic Panel: Recent Labs  Lab 03/28/20 1056 03/28/20 1710 03/29/20 0617  NA 133*  --  136  K 4.3  --  4.0  CL 94*  --  95*  CO2 28  --  30  GLUCOSE 161*  --  114*  BUN 33*  --  27*  CREATININE 2.17*  --  1.77*  CALCIUM 8.9  --  9.0  MG  --  1.2*  --    GFR: Estimated Creatinine Clearance: 71 mL/min (A) (by C-G formula based on SCr of 1.77 mg/dL (H)). Liver Function Tests: Recent Labs  Lab 03/28/20 1710 03/29/20 0617  AST 34 31  ALT 35 27  ALKPHOS 159* 135*  BILITOT 1.1 0.9  PROT 8.9* 8.0  ALBUMIN 4.3 3.7  Recent Labs  Lab 03/28/20 1056  LIPASE 63*   No results for input(s): AMMONIA in the last 168 hours. Coagulation Profile: No results for input(s): INR, PROTIME in the last 168 hours. Cardiac Enzymes: No results for input(s): CKTOTAL, CKMB, CKMBINDEX, TROPONINI in the last 168 hours. BNP (last 3 results) No results for input(s): PROBNP in the last 8760 hours. HbA1C: No results for input(s): HGBA1C in the last 72 hours. CBG: Recent Labs  Lab 03/29/20 1311 03/29/20 1745 03/29/20 2149 03/30/20 0735 03/30/20 1148  GLUCAP 114* 89 91 111* 159*   Lipid Profile: No results for input(s): CHOL, HDL, LDLCALC, TRIG, CHOLHDL, LDLDIRECT in the last 72 hours. Thyroid Function Tests: No results for input(s): TSH, T4TOTAL, FREET4, T3FREE, THYROIDAB in the last 72 hours. Anemia Panel: No results for input(s): VITAMINB12, FOLATE, FERRITIN, TIBC, IRON, RETICCTPCT in the last 72 hours. Sepsis Labs: No results for input(s): PROCALCITON, LATICACIDVEN in the last 168 hours.  Recent  Results (from the past 240 hour(s))  SARS Coronavirus 2 by RT PCR (hospital order, performed in [facility] hospital lab) Nasopharyngeal Nasopharyngeal Swab     Status: None   Collection Time: 03/28/20  7:38 PM   Specimen: Nasopharyngeal Swab  Result Value Ref Range Status   SARS Coronavirus 2 NEGATIVE NEGATIVE Final    Comment: (NOTE) SARS-CoV-2 target nucleic acids are NOT DETECTED.  The SARS-CoV-2 RNA is generally detectable in upper and lower respiratory specimens during the acute phase of infection. The lowest concentration of SARS-CoV-2 viral copies this assay can detect is 250 copies / mL. A negative result does not preclude SARS-CoV-2 infection and should not be used as the sole basis for treatment or other patient management decisions.  A negative result may occur with improper specimen collection / handling, submission of specimen other than nasopharyngeal swab, presence of viral mutation(s) within the areas targeted by this assay, and inadequate number of viral copies (<250 copies / mL). A negative result must be combined with clinical observations, patient history, and epidemiological information.  Fact Sheet for Patients:   StrictlyIdeas.no  Fact Sheet for Healthcare Providers: BankingDealers.co.za  This test is not yet approved or  cleared by the [place] FDA and has been authorized for detection and/or diagnosis of SARS-CoV-2 by FDA under an Emergency Use Authorization (EUA).  This EUA will remain in effect (meaning this test can be used) for the duration of the COVID-19 declaration under Section 564(b)(1) of the Act, 21 U.S.C. section 360bbb-3(b)(1), unless the authorization is terminated or revoked sooner.  Performed at [facility], [address] [place],  [address]          Radiology Studies: CT ABDOMEN PELVIS W CONTRAST  Result Date: 03/28/2020 CLINICAL DATA:  Acute abdominal pain  nonlocalized. EXAM: CT ABDOMEN AND PELVIS WITH CONTRAST TECHNIQUE: Multidetector CT imaging of the abdomen and pelvis was performed using the standard protocol following bolus administration of intravenous contrast. CONTRAST:  160mL OMNIPAQUE IOHEXOL 300 MG/ML  SOLN COMPARISON:  CT abdomen pelvis 01/06/2020 FINDINGS: Lower chest: Lung bases clear bilaterally. The heart is enlarged with a pacemaker extending into the right ventricle. The left ventricle is significantly dilated. Hepatobiliary: Course texture to the liver with enlargement left lobe and mild irregularity of the liver capsule suggesting cirrhosis. No liver lesion. Gallbladder and bile ducts within normal limits. Pancreas: Negative Spleen: Negative Adrenals/Urinary Tract: Adrenal glands are unremarkable. Kidneys are normal, without renal calculi, focal lesion, or hydronephrosis. Bladder is unremarkable. Stomach/Bowel: Negative for bowel obstruction. No  bowel mass or edema. Normal appendix. Vascular/Lymphatic: Mild atherosclerotic aorta without aneurysm. 14 mm portacaval lymph node unchanged from prior studies. No other prominent lymph nodes identified. Reproductive: Mild prostate enlargement Other: Periumbilical hernia containing abdominal fat, unchanged from the prior study. No significant edema or fluid in the hernia. Musculoskeletal: Lumbar disc degeneration and spurring. No acute skeletal abnormality. IMPRESSION: No acute abnormality no change from the prior study. Findings suggestive of cirrhosis. No ascites or mass. Portacaval lymph node stable. Periumbilical hernia containing fat unchanged. Electronically Signed   By: Franchot Gallo M.D.   On: 03/28/2020 18:39   DG UGI W SINGLE CM (SOL OR THIN BA)  Result Date: 03/29/2020 CLINICAL DATA:  Abdominal pain EXAM: UPPER GI SERIES WITHOUT KUB TECHNIQUE: Routine double-contrast upper GI series was performed with thick and thin barium and effervescent crystals. FLUOROSCOPY TIME:  Fluoroscopy Time:  1.6  minutes Radiation Exposure Index (if provided by the fluoroscopic device): 71.5 mGy Number of Acquired Spot Images: 19 COMPARISON:  CT 03/28/2020 FINDINGS: The esophageal motility is within normal limits. There is no evidence of stricture, mass, or ulceration. Rapid sequence imaging of the esophagus in the AP and lateral projections demonstrates no mucosal abnormalities. The stomach distends normally without evidence of mass, ulceration, or stricture. Gastroesophageal junction is normally located without hiatal hernia. Duodenal bulb and duodenal sweep are normal in appearance and positioning without malrotation. Mild spontaneous gastroesophageal reflux into the distal esophagus on supine positioning. IMPRESSION: Mild spontaneous gastroesophageal reflux. Otherwise unremarkable double-contrast upper GI examination. No mucosal abnormality or ulceration. Electronically Signed   By: Davina Poke D.O.   On: 03/29/2020 16:30        Scheduled Meds: . alum & mag hydroxide-simeth  30 mL Oral TID AC & HS  . atorvastatin  40 mg Oral q1800  . clindamycin  1 application Topical BID  . gabapentin  300 mg Oral BID  . heparin  5,000 Units Subcutaneous Q8H  . insulin aspart  0-20 Units Subcutaneous TID WC  . insulin aspart  0-5 Units Subcutaneous QHS  . insulin detemir  15 Units Subcutaneous BID  . lipase/protease/amylase  24,000 Units Oral 5 X Daily  . pantoprazole (PROTONIX) IV  40 mg Intravenous Q12H  . Ensure Max Protein  11 oz Oral BID  . sucralfate  1 g Oral TID WC & HS  . torsemide  40 mg Oral Daily  . umeclidinium-vilanterol  1 puff Inhalation Q1500   Continuous Infusions: . sodium chloride Stopped (03/29/20 2346)  . famotidine (PEPCID) IV 20 mg (03/30/20 0928)     LOS: 2 days    Time spent: 25 minutes    Sidney Ace, MD [facility] Pager [phone]  If 7PM-7AM, please contact night-coverage 03/30/2020, 2:03 PM

## 2020-03-30 NOTE — Progress Notes (Signed)
Vonda Antigua, MD [address] [place], [place], [place], [address] [address], [place], [place], [place], [address] Phone: [phone]  Fax: [phone]   Subjective:  Patient appears comfortable in his room today. Reports abdominal pain has resolved. No nausea or vomiting.  No episodes of bleeding  Objective: Exam: Vital signs in last 24 hours: Vitals:   03/29/20 1932 03/30/20 0007 03/30/20 0423 03/30/20 0737  BP: 114/81 (!) 105/89 112/69 109/76  Pulse: 70 75 61 87  Resp: 18  17 16   Temp: 97.9 F (36.6 C) 98.3 F (36.8 C) 98.1 F (36.7 C) 97.7 F (36.5 C)  TempSrc: Oral Oral Oral Oral  SpO2: 96% 90% 97% 94%  Weight:      Height:       Weight change:   Intake/Output Summary (Last 24 hours) at 03/30/2020 0756 Last data filed at 03/30/2020 0702 Gross per 24 hour  Intake 99.14 ml  Output --  Net 99.14 ml    General: No acute distress, AAO x3 Abd: Soft, NT/ND, No HSM Skin: Warm, no rashes Neck: Supple, Trachea midline   Lab Results: Lab Results  Component Value Date   WBC 3.9 (L) 03/29/2020   HGB 16.2 03/29/2020   HCT 43.8 03/29/2020   MCV 89.9 03/29/2020   PLT 138 (L) 03/29/2020   Micro Results: Recent Results (from the past 240 hour(s))  SARS Coronavirus 2 by RT PCR (hospital order, performed in [place] hospital lab) Nasopharyngeal Nasopharyngeal Swab     Status: None   Collection Time: 03/28/20  7:38 PM   Specimen: Nasopharyngeal Swab  Result Value Ref Range Status   SARS Coronavirus 2 NEGATIVE NEGATIVE Final    Comment: (NOTE) SARS-CoV-2 target nucleic acids are NOT DETECTED.  The SARS-CoV-2 RNA is generally detectable in upper and lower respiratory specimens during the acute phase of infection. The lowest concentration of SARS-CoV-2 viral copies this assay can detect is 250 copies / mL. A negative result does not preclude SARS-CoV-2 infection and should not be used as the sole basis for treatment or other patient management  decisions.  A negative result may occur with improper specimen collection / handling, submission of specimen other than nasopharyngeal swab, presence of viral mutation(s) within the areas targeted by this assay, and inadequate number of viral copies (<250 copies / mL). A negative result must be combined with clinical observations, patient history, and epidemiological information.  Fact Sheet for Patients:   StrictlyIdeas.no  Fact Sheet for Healthcare Providers: BankingDealers.co.za  This test is not yet approved or  cleared by the [place] FDA and has been authorized for detection and/or diagnosis of SARS-CoV-2 by FDA under an Emergency Use Authorization (EUA).  This EUA will remain in effect (meaning this test can be used) for the duration of the COVID-19 declaration under Section 564(b)(1) of the Act, 21 U.S.C. section 360bbb-3(b)(1), unless the authorization is terminated or revoked sooner.  Performed at [facility], [address], [place],  [address]    Studies/Results: [place] Chest 2 View  Result Date: 03/28/2020 CLINICAL DATA:  Chest pain. EXAM: CHEST - 2 VIEW COMPARISON:  Prior chest radiographs 02/27/2020 and earlier. FINDINGS: Redemonstrated left chest multi lead implantable cardiac device. Unchanged cardiomegaly. There is no appreciable airspace consolidation or pulmonary edema. No evidence of pleural effusion or pneumothorax. No acute bony abnormality is identified. IMPRESSION: Unchanged cardiomegaly. No evidence of acute cardiopulmonary abnormality. Electronically Signed   By: Kellie Simmering DO   On: 03/28/2020 11:25  CT ABDOMEN PELVIS W CONTRAST  Result Date: 03/28/2020 CLINICAL DATA:  Acute abdominal pain nonlocalized. EXAM: CT ABDOMEN AND PELVIS WITH CONTRAST TECHNIQUE: Multidetector CT imaging of the abdomen and pelvis was performed using the standard protocol following bolus administration of intravenous  contrast. CONTRAST:  142mL OMNIPAQUE IOHEXOL 300 MG/ML  SOLN COMPARISON:  CT abdomen pelvis 01/06/2020 FINDINGS: Lower chest: Lung bases clear bilaterally. The heart is enlarged with a pacemaker extending into the right ventricle. The left ventricle is significantly dilated. Hepatobiliary: Course texture to the liver with enlargement left lobe and mild irregularity of the liver capsule suggesting cirrhosis. No liver lesion. Gallbladder and bile ducts within normal limits. Pancreas: Negative Spleen: Negative Adrenals/Urinary Tract: Adrenal glands are unremarkable. Kidneys are normal, without renal calculi, focal lesion, or hydronephrosis. Bladder is unremarkable. Stomach/Bowel: Negative for bowel obstruction. No bowel mass or edema. Normal appendix. Vascular/Lymphatic: Mild atherosclerotic aorta without aneurysm. 14 mm portacaval lymph node unchanged from prior studies. No other prominent lymph nodes identified. Reproductive: Mild prostate enlargement Other: Periumbilical hernia containing abdominal fat, unchanged from the prior study. No significant edema or fluid in the hernia. Musculoskeletal: Lumbar disc degeneration and spurring. No acute skeletal abnormality. IMPRESSION: No acute abnormality no change from the prior study. Findings suggestive of cirrhosis. No ascites or mass. Portacaval lymph node stable. Periumbilical hernia containing fat unchanged. Electronically Signed   By: Franchot Gallo M.D.   On: 03/28/2020 18:39   DG UGI W SINGLE CM (SOL OR THIN BA)  Result Date: 03/29/2020 CLINICAL DATA:  Abdominal pain EXAM: UPPER GI SERIES WITHOUT KUB TECHNIQUE: Routine double-contrast upper GI series was performed with thick and thin barium and effervescent crystals. FLUOROSCOPY TIME:  Fluoroscopy Time:  1.6 minutes Radiation Exposure Index (if provided by the fluoroscopic device): 71.5 mGy Number of Acquired Spot Images: 19 COMPARISON:  CT 03/28/2020 FINDINGS: The esophageal motility is within normal limits.  There is no evidence of stricture, mass, or ulceration. Rapid sequence imaging of the esophagus in the AP and lateral projections demonstrates no mucosal abnormalities. The stomach distends normally without evidence of mass, ulceration, or stricture. Gastroesophageal junction is normally located without hiatal hernia. Duodenal bulb and duodenal sweep are normal in appearance and positioning without malrotation. Mild spontaneous gastroesophageal reflux into the distal esophagus on supine positioning. IMPRESSION: Mild spontaneous gastroesophageal reflux. Otherwise unremarkable double-contrast upper GI examination. No mucosal abnormality or ulceration. Electronically Signed   By: Davina Poke D.O.   On: 03/29/2020 16:30   Medications:  Scheduled Meds: . alum & mag hydroxide-simeth  30 mL Oral TID AC & HS  . atorvastatin  40 mg Oral q1800  . clindamycin  1 application Topical BID  . gabapentin  300 mg Oral BID  . heparin  5,000 Units Subcutaneous Q8H  . insulin aspart  0-20 Units Subcutaneous TID WC  . insulin aspart  0-5 Units Subcutaneous QHS  . insulin detemir  15 Units Subcutaneous BID  . lipase/protease/amylase  24,000 Units Oral 5 X Daily  . pantoprazole (PROTONIX) IV  40 mg Intravenous Q12H  . sucralfate  1 g Oral TID WC & HS  . tiotropium  18 mcg Inhalation Daily  . torsemide  40 mg Oral Daily  . umeclidinium-vilanterol  1 puff Inhalation Q1500   Continuous Infusions: . sodium chloride Stopped (03/29/20 2346)  . famotidine (PEPCID) IV Stopped (03/29/20 2329)   PRN Meds:.sodium chloride, albuterol, lidocaine, ondansetron **OR** ondansetron (ZOFRAN) IV, ondansetron, oxyCODONE   Assessment: Principal Problem:   Abdominal pain Active Problems:  Hypertension   Chronic systolic heart failure (HCC)   Diabetes (HCC)   Tobacco use   COPD (chronic obstructive pulmonary disease) (HCC)   ARF (acute renal failure) (HCC)    Plan: Upper GI studies shows mild reflux, otherwise  unremarkable with no concerning lesions reported  No indication for EGD given CT scan and upper GI study did not show any concerning lesions or large obstructive lesions.  In the setting of recent NSTEMI with cardiac work-up not completed at the time, catheterization pending since patient left AMA, endoscopic procedures would be high risks and benefits and are unlikely to change management  Abdominal pain likely due to GERD, and with his history of recurrent acute pancreatitis, he may have chronic abdominal pain issues as well.  Continue pancreatic replacement enzymes  Continue PPI  Follow-up in primary GI clinic in 2 to 3 weeks after discharge  Follow-up pending H. pylori serology and treat if positive  GI service will sign off at this time, please page GI on call with any questions or concerns   LOS: 2 days   Vonda Antigua, MD 03/30/2020, 7:56 AM

## 2020-03-31 LAB — CBC WITH DIFFERENTIAL/PLATELET
Abs Immature Granulocytes: 0.01 10*3/uL (ref 0.00–0.07)
Basophils Absolute: 0 10*3/uL (ref 0.0–0.1)
Basophils Relative: 0 %
Eosinophils Absolute: 0.1 10*3/uL (ref 0.0–0.5)
Eosinophils Relative: 3 %
Hemoglobin: 15.8 g/dL (ref 13.0–17.0)
Immature Granulocytes: 0 %
Lymphocytes Relative: 44 %
Lymphs Abs: 1.3 10*3/uL (ref 0.7–4.0)
Monocytes Absolute: 0.4 10*3/uL (ref 0.1–1.0)
Monocytes Relative: 14 %
Neutro Abs: 1.1 10*3/uL — ABNORMAL LOW (ref 1.7–7.7)
Neutrophils Relative %: 39 %
Platelets: 114 10*3/uL — ABNORMAL LOW (ref 150–400)
Smear Review: DECREASED
WBC: 2.9 10*3/uL — ABNORMAL LOW (ref 4.0–10.5)
nRBC: 0 % (ref 0.0–0.2)

## 2020-03-31 LAB — BASIC METABOLIC PANEL
Anion gap: 12 (ref 5–15)
BUN: 24 mg/dL — ABNORMAL HIGH (ref 6–20)
CO2: 30 mmol/L (ref 22–32)
Calcium: 8.8 mg/dL — ABNORMAL LOW (ref 8.9–10.3)
Chloride: 93 mmol/L — ABNORMAL LOW (ref 98–111)
Creatinine, Ser: 1.84 mg/dL — ABNORMAL HIGH (ref 0.61–1.24)
GFR calc Af Amer: 51 mL/min — ABNORMAL LOW (ref >60)
GFR calc non Af Amer: 44 mL/min — ABNORMAL LOW (ref >60)
Glucose, Bld: 119 mg/dL — ABNORMAL HIGH (ref 70–99)
Potassium: 3.5 mmol/L (ref 3.5–5.1)
Sodium: 135 mmol/L (ref 135–145)

## 2020-03-31 LAB — GLUCOSE, CAPILLARY: Glucose-Capillary: 117 mg/dL — ABNORMAL HIGH (ref 70–99)

## 2020-03-31 MED ORDER — PANTOPRAZOLE SODIUM 40 MG PO TBEC
40.0000 mg | DELAYED_RELEASE_TABLET | Freq: Two times a day (BID) | ORAL | 0 refills | Status: DC
Start: 2020-03-31 — End: 2020-08-13

## 2020-03-31 MED ORDER — SUCRALFATE 1 G PO TABS
1.0000 g | ORAL_TABLET | Freq: Four times a day (QID) | ORAL | 0 refills | Status: DC
Start: 2020-03-31 — End: 2020-08-13

## 2020-03-31 MED ORDER — OXYCODONE-ACETAMINOPHEN 5-325 MG PO TABS: 1.0000 | ORAL_TABLET | Freq: Four times a day (QID) | ORAL | 0 refills | Status: AC | PRN

## 2020-03-31 MED ORDER — FAMOTIDINE 20 MG PO TABS
20.0000 mg | ORAL_TABLET | Freq: Two times a day (BID) | ORAL | 0 refills | Status: DC
Start: 2020-03-31 — End: 2020-11-14

## 2020-03-31 NOTE — Progress Notes (Signed)
[facility] Encounter Note  Patient: ILLYA GIENGER / Admit Date: 03/28/2020 / Date of Encounter: 03/31/2020, 6:47 AM   Subjective: Patient overall feeling relatively well from the cardiovascular standpoint with no evidence of true cardiac angina or congestive heart failure type symptoms although his nausea may be secondary to low cardiac output causing abdominal discomfort.  The patient's troponins do not suggest myocardial infarction either from previous admissions at [facility] or current admissions with a BNP of 246 consistent with chronic heart failure and no acute issues chest x-ray normal without evidence of edema.  The patient has had continued paced rhythm with no evidence of rhythm disturbances.  MRI and CT of abdomen suggesting no evidence of acute issues requiring further intervention and with patient being stable from a cardiac standpoint would be okay for further investigation of abdominal concerns  Review of Systems: Positive for: Abdominal discomfort Negative for: Vision change, hearing change, syncope, dizziness, nausea, vomiting,diarrhea, bloody stool,   cough, congestion, diaphoresis, urinary frequency, urinary pain,skin lesions, skin rashes Others previously listed  Objective: Telemetry: Paced rhythm Physical Exam: Blood pressure (!) 104/86, pulse 62, temperature 97.6 F (36.4 C), temperature source Oral, resp. rate 19, height 5\' 6"  (1.676 m), weight (!) 139.8 kg, SpO2 99 %. Body mass index is 49.75 kg/m. General: Well developed, well nourished, in no acute distress. Head: Normocephalic, atraumatic, sclera non-icteric, no xanthomas, nares are without discharge. Neck: No apparent masses Lungs: Normal respirations with few wheezes, no rhonchi, no rales , no crackles   Heart: Regular rate and rhythm, normal S1 S2, no murmur, no rub, no gallop, PMI is normal size and placement, carotid upstroke normal without bruit, jugular venous pressure normal Abdomen: Soft,  non-tender,  distended with normoactive bowel sounds. No hepatosplenomegaly. Abdominal aorta is normal size without bruit Extremities: Trace to 1+ edema, no clubbing, no cyanosis, no ulcers,  Peripheral: 2+ radial, 2+ femoral, 2+ dorsal pedal pulses Neuro: Alert and oriented. Moves all extremities spontaneously. Psych:  Responds to questions appropriately with a normal affect.   Intake/Output Summary (Last 24 hours) at 03/31/2020 0647 Last data filed at 03/31/2020 9509 Gross per 24 hour  Intake 278 ml  Output --  Net 278 ml    Inpatient Medications:  . alum & mag hydroxide-simeth  30 mL Oral TID AC & HS  . atorvastatin  40 mg Oral q1800  . clindamycin  1 application Topical BID  . gabapentin  300 mg Oral BID  . heparin  5,000 Units Subcutaneous Q8H  . insulin aspart  0-20 Units Subcutaneous TID WC  . insulin aspart  0-5 Units Subcutaneous QHS  . insulin detemir  15 Units Subcutaneous BID  . lipase/protease/amylase  24,000 Units Oral 5 X Daily  . pantoprazole (PROTONIX) IV  40 mg Intravenous Q12H  . Ensure Max Protein  11 oz Oral BID  . sucralfate  1 g Oral TID WC & HS  . torsemide  40 mg Oral Daily  . umeclidinium-vilanterol  1 puff Inhalation Q1500   Infusions:  . sodium chloride Stopped (03/30/20 2301)  . famotidine (PEPCID) IV Stopped (03/30/20 2228)    Labs: Recent Labs    03/28/20 1056 03/28/20 1710 03/29/20 0617  NA 133*  --  136  K 4.3  --  4.0  CL 94*  --  95*  CO2 28  --  30  GLUCOSE 161*  --  114*  BUN 33*  --  27*  CREATININE 2.17*  --  1.77*  CALCIUM  8.9  --  9.0  MG  --  1.2*  --    Recent Labs    03/28/20 1710 03/29/20 0617  AST 34 31  ALT 35 27  ALKPHOS 159* 135*  BILITOT 1.1 0.9  PROT 8.9* 8.0  ALBUMIN 4.3 3.7   Recent Labs    03/28/20 1056 03/29/20 0617  WBC 4.1 3.9*  HGB 16.7 16.2  HCT 46.3 43.8  MCV 91.7 89.9  PLT 139* 138*   No results for input(s): CKTOTAL, CKMB, TROPONINI in the last 72 hours. Invalid input(s): POCBNP No  results for input(s): HGBA1C in the last 72 hours.   Weights: Filed Weights   03/28/20 1053  Weight: (!) 139.8 kg     Radiology/Studies:  DG Chest 2 View  Result Date: 03/28/2020 CLINICAL DATA:  Chest pain. EXAM: CHEST - 2 VIEW COMPARISON:  Prior chest radiographs 02/27/2020 and earlier. FINDINGS: Redemonstrated left chest multi lead implantable cardiac device. Unchanged cardiomegaly. There is no appreciable airspace consolidation or pulmonary edema. No evidence of pleural effusion or pneumothorax. No acute bony abnormality is identified. IMPRESSION: Unchanged cardiomegaly. No evidence of acute cardiopulmonary abnormality. Electronically Signed   By: Kellie Simmering DO   On: 03/28/2020 11:25   CT ABDOMEN PELVIS W CONTRAST  Result Date: 03/28/2020 CLINICAL DATA:  Acute abdominal pain nonlocalized. EXAM: CT ABDOMEN AND PELVIS WITH CONTRAST TECHNIQUE: Multidetector CT imaging of the abdomen and pelvis was performed using the standard protocol following bolus administration of intravenous contrast. CONTRAST:  19mL OMNIPAQUE IOHEXOL 300 MG/ML  SOLN COMPARISON:  CT abdomen pelvis 01/06/2020 FINDINGS: Lower chest: Lung bases clear bilaterally. The heart is enlarged with a pacemaker extending into the right ventricle. The left ventricle is significantly dilated. Hepatobiliary: Course texture to the liver with enlargement left lobe and mild irregularity of the liver capsule suggesting cirrhosis. No liver lesion. Gallbladder and bile ducts within normal limits. Pancreas: Negative Spleen: Negative Adrenals/Urinary Tract: Adrenal glands are unremarkable. Kidneys are normal, without renal calculi, focal lesion, or hydronephrosis. Bladder is unremarkable. Stomach/Bowel: Negative for bowel obstruction. No bowel mass or edema. Normal appendix. Vascular/Lymphatic: Mild atherosclerotic aorta without aneurysm. 14 mm portacaval lymph node unchanged from prior studies. No other prominent lymph nodes identified.  Reproductive: Mild prostate enlargement Other: Periumbilical hernia containing abdominal fat, unchanged from the prior study. No significant edema or fluid in the hernia. Musculoskeletal: Lumbar disc degeneration and spurring. No acute skeletal abnormality. IMPRESSION: No acute abnormality no change from the prior study. Findings suggestive of cirrhosis. No ascites or mass. Portacaval lymph node stable. Periumbilical hernia containing fat unchanged. Electronically Signed   By: Franchot Gallo M.D.   On: 03/28/2020 18:39   DG UGI W SINGLE CM (SOL OR THIN BA)  Result Date: 03/29/2020 CLINICAL DATA:  Abdominal pain EXAM: UPPER GI SERIES WITHOUT KUB TECHNIQUE: Routine double-contrast upper GI series was performed with thick and thin barium and effervescent crystals. FLUOROSCOPY TIME:  Fluoroscopy Time:  1.6 minutes Radiation Exposure Index (if provided by the fluoroscopic device): 71.5 mGy Number of Acquired Spot Images: 19 COMPARISON:  CT 03/28/2020 FINDINGS: The esophageal motility is within normal limits. There is no evidence of stricture, mass, or ulceration. Rapid sequence imaging of the esophagus in the AP and lateral projections demonstrates no mucosal abnormalities. The stomach distends normally without evidence of mass, ulceration, or stricture. Gastroesophageal junction is normally located without hiatal hernia. Duodenal bulb and duodenal sweep are normal in appearance and positioning without malrotation. Mild spontaneous gastroesophageal reflux into the distal esophagus  on supine positioning. IMPRESSION: Mild spontaneous gastroesophageal reflux. Otherwise unremarkable double-contrast upper GI examination. No mucosal abnormality or ulceration. Electronically Signed   By: Davina Poke D.O.   On: 03/29/2020 16:30     Assessment and Recommendation  44 y.o. 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  Procedure upper endoscopy as needed for further evaluation of abdominal discomfort without restriction 2.  No further cardiac diagnostics necessary as per above discussion of stability from the cardiac standpoint 3.  No additional medication management from the cardiac standpoint due to hypotension and previous intolerance 4.  Ambulation and if ambulating well from cardiovascular standpoint okay for discharge to home with follow-up as outpatient for gastroenterology involvement  Signed, Serafina Royals M.D. FACC

## 2020-03-31 NOTE — Discharge Summary (Signed)
Physician Discharge Summary  Oscar [name] BJS:[id number] DOB: 1975-11-28 DOA: 03/28/2020  PCP: [facility] [place] date: 03/28/2020 Discharge date: 03/31/2020  Admitted From: Home Disposition:  Home  Recommendations for Outpatient Follow-up:  1. Follow up with PCP in 1-2 weeks 2. Follow up with GI in 2-3 weeks  Home Health:No Equipment/Devices:None Discharge Condition:Stable CODE STATUS:Full Diet recommendation: Heart Healthy / Carb Modified Brief/Interim Summary: YWV:Oscar Oscar Scogginsis a 44 y.o.malewith medical history significant ofischemic cardiomyopathy status post AICD placement, asthma, diabetes, hyperlipidemia, hypertension, morbid obesity, chronic pancreatitis, sleep apnea among other things who was seen about 2 weeks ago in the ER with possible CHF exacerbation. Patient left AMA. He has apparently been having significant epigastric pain which is persistent going to his back. He was suspected to have either peptic ulcer disease or severe GERD. Patient was sent to an outside gastroenterologist in [place] who wanted to do an EGD but needed cardiac clearance. That has not been done yet. Pain got worse tonight persistent. Denied melena denied any bright red blood per rectum but is having nausea and occasional vomiting. Denied chest pain. Denied any significant shortness of breath above his baseline. He came to the ER where he was seen and evaluated. Patient being admitted to the hospital for symptom treatment and possible assessment for EGD.  7/29: Patient seen and examined in emergency department.  Hemodynamically stable.  Complaining of severe epigastric pain.  Not responding to viscous lidocaine or nonnarcotic therapy.  Seen by gastroenterology.  Upper GI series recommended.  Endoscopic evaluation not recommended at this time.  7/30: Patient seen and examined.  Reports that epigastric pain is improved.  Upper GI series negative for any masses  or lesions.  Demonstrates only mild reflux.  GI signed off.  Seen by cardiology.  Risk assessment completed.  Currently no plans for inpatient endoscopic evaluation.  7/31: Patient seen and examined the day of discharge.  Abdominal pain much improved.  Patient able tolerate p.o. without issue.  Seen by GI, no inpatient endoscopic evaluation recommended.  Upper GI series reassuring.  Mild reflux noted.  Also seen by cardiology.  No opposition from their standpoint for endoscopic evaluation.  Patient is recommended to follow-up with his gastroenterologist in 2 to 3weeks.  We will prescribe reflux regimen on discharge.  Protonix 40 mg p.o. twice daily, Pepcid 20 mg p.o. twice daily, sucralfate 1 g p.o. 4 times daily.  Also prescribed small amount of oxycodone for symptomatic breakthrough pain.  Follow-up with PCP in 1 week.  Follow-up with GI in 2 to 3 weeks.  Discharge Diagnoses:  Principal Problem:   Abdominal pain Active Problems:   Hypertension   Chronic systolic heart failure (HCC)   Diabetes (HCC)   Tobacco use   COPD (chronic obstructive pulmonary disease) (HCC)   ARF (acute renal failure) (HCC)  Epigastric pain Likely due to severe GERD.  Difficult to exclude peptic ulcer disease.  Patient was previously recommended EGD however did not get cardiac clearance and this was not done.  Protonix and sucralfate initiated in the emergency department. -Upper GI series negative -GI signed off -Cardiology consulted for clearance.  No opposition from their standpoint for endoscopic evaluation Discharge plan: No immediate plans for inpatient endoscopy Continue twice daily PPI, prescribed on discharge Continue twice daily H2 blocker, prescribed on discharge Continue sucralfate, prescribed on discharge Pain improved on above regimen.  Will prescribe and have patient follow-up with his established gastroenterologist in 2 to 3 weeks  Chronic systolic  congestive heart failure EF 20% Status post  AICD Coronary artery disease Patient was here 2 weeks ago and left AMA despite being recommended for cardiac catheterization.  He was recommended aspirin and Plavix however I do not believe he has been taking either of these.  Currently no indication of NSTEMI.  Will likely need to either inpatient versus outpatient cardiology evaluation in the near future.  Diuretics to be resumed.  Patient is normal volume status.  Continue telemetry monitoring. -Cardiology consulted.  No further inpatient cardiac diagnostics.  Continue current medication regimen.  No opposition cardiac standpoint for endoscopic evaluation should 1 be indicated  Acute kidney injury Likely multifactorial.  Poor oral intake is driving factor.  Also also been on diuretics and ACE inhibitor's.  Kidney function responded and has returned to baseline.  Can resume previous treatment  COPD Baseline no oxygen requirement.  No evidence of acute exacerbation.  Diabetes mellitus On basal bolus regimen for home dose Continue sliding scale coverage and carb modified diet  Essential hypertension Controlled, continue home regimen  Tobacco abuse Offer nicotine patch and cessation counseling    Discharge Instructions  Discharge Instructions    Diet - low sodium heart healthy   Complete by: As directed    Diet general   Complete by: As directed    Eat bland diet, small meals.  Avoid rich, fatty, or spicy foods   Increase activity slowly   Complete by: As directed      Allergies as of 03/31/2020      Reactions   Bidil [isosorb Dinitrate-hydralazine] Other (See Comments)   Migraine Headache Migraine Headache   Ciprofloxacin       Medication List    STOP taking these medications   aspirin EC 325 MG tablet   meloxicam 15 MG tablet Commonly known as: MOBIC     TAKE these medications   aluminum-magnesium hydroxide-simethicone 025-852-77 MG/5ML Susp Commonly known as: MAALOX Take 30 mLs by mouth 4 (four) times daily  -  before meals and at bedtime.   Anoro Ellipta 62.5-25 MCG/INH Aepb Generic drug: umeclidinium-vilanterol Inhale 1 puff into the lungs daily in the afternoon.   atorvastatin 40 MG tablet Commonly known as: LIPITOR Take 1 tablet (40 mg total) by mouth daily at 6 PM.   clindamycin 1 % external solution Commonly known as: CLEOCIN T Apply 1 application topically 2 (two) times daily.   clopidogrel 75 MG tablet Commonly known as: Plavix Take 1 tablet (75 mg total) by mouth daily.   Entresto 49-51 MG Generic drug: sacubitril-valsartan Take 1 tablet by mouth 2 (two) times daily.   famotidine 20 MG tablet Commonly known as: PEPCID Take 1 tablet (20 mg total) by mouth 2 (two) times daily.   gabapentin 300 MG capsule Commonly known as: NEURONTIN Take 300 mg by mouth 2 (two) times daily.   Levemir FlexTouch 100 UNIT/ML FlexPen Generic drug: insulin detemir Inject 30 Units into the skin 2 (two) times daily.   lidocaine 2 % solution Commonly known as: XYLOCAINE Use as directed 15 mLs in the mouth or throat every 6 (six) hours as needed (stomach pain).   liraglutide 18 MG/3ML Sopn Commonly known as: VICTOZA Inject 18 mg into the skin daily in the afternoon.   ondansetron 4 MG disintegrating tablet Commonly known as: Zofran ODT Take 1 tablet (4 mg total) by mouth every 8 (eight) hours as needed for nausea or vomiting.   oxyCODONE-acetaminophen 5-325 MG tablet Commonly known as: Percocet Take 1 tablet by  mouth every 6 (six) hours as needed for up to 3 days for severe pain. What changed: when to take this   pantoprazole 40 MG tablet Commonly known as: Protonix Take 1 tablet (40 mg total) by mouth 2 (two) times daily.   sucralfate 1 g tablet Commonly known as: Carafate Take 1 tablet (1 g total) by mouth 4 (four) times daily.   tiotropium 18 MCG inhalation capsule Commonly known as: SPIRIVA Place 18 mcg into inhaler and inhale daily.   torsemide 20 MG tablet Commonly  known as: DEMADEX Take 1 tablet (20 mg total) by mouth daily. What changed: how much to take   Ventolin HFA 108 (90 Base) MCG/ACT inhaler Generic drug: albuterol 2 inhalations every 6 (six) hours as needed   Zenpep 40000-126000 units Cpep Generic drug: Pancrelipase (Lip-Prot-Amyl) Take 2-4 capsules by mouth 5 (five) times daily. [place], [facility]. Schedule an appointment as soon as possible for a visit in 1 week(s).   Specialty: General Practice Contact information: [place] [address] [phone]              Allergies  Allergen Reactions  . Bidil [Isosorb Dinitrate-Hydralazine] Other (See Comments)    Migraine Headache Migraine Headache  . Ciprofloxacin     Consultations:  GI  Cardiology-[facility]   Procedures/Studies: DG Chest 2 View  Result Date: 03/28/2020 CLINICAL DATA:  Chest pain. EXAM: CHEST - 2 VIEW COMPARISON:  Prior chest radiographs 02/27/2020 and earlier. FINDINGS: Redemonstrated left chest multi lead implantable cardiac device. Unchanged cardiomegaly. There is no appreciable airspace consolidation or pulmonary edema. No evidence of pleural effusion or pneumothorax. No acute bony abnormality is identified. IMPRESSION: Unchanged cardiomegaly. No evidence of acute cardiopulmonary abnormality. Electronically Signed   By: Kellie Simmering DO   On: 03/28/2020 11:25   CT ABDOMEN PELVIS W CONTRAST  Result Date: 03/28/2020 CLINICAL DATA:  Acute abdominal pain nonlocalized. EXAM: CT ABDOMEN AND PELVIS WITH CONTRAST TECHNIQUE: Multidetector CT imaging of the abdomen and pelvis was performed using the standard protocol following bolus administration of intravenous contrast. CONTRAST:  157mL OMNIPAQUE IOHEXOL 300 MG/ML  SOLN COMPARISON:  CT abdomen pelvis 01/06/2020 FINDINGS: Lower chest: Lung bases clear  bilaterally. The heart is enlarged with a pacemaker extending into the right ventricle. The left ventricle is significantly dilated. Hepatobiliary: Course texture to the liver with enlargement left lobe and mild irregularity of the liver capsule suggesting cirrhosis. No liver lesion. Gallbladder and bile ducts within normal limits. Pancreas: Negative Spleen: Negative Adrenals/Urinary Tract: Adrenal glands are unremarkable. Kidneys are normal, without renal calculi, focal lesion, or hydronephrosis. Bladder is unremarkable. Stomach/Bowel: Negative for bowel obstruction. No bowel mass or edema. Normal appendix. Vascular/Lymphatic: Mild atherosclerotic aorta without aneurysm. 14 mm portacaval lymph node unchanged from prior studies. No other prominent lymph nodes identified. Reproductive: Mild prostate enlargement Other: Periumbilical hernia containing abdominal fat, unchanged from the prior study. No significant edema or fluid in the hernia. Musculoskeletal: Lumbar disc degeneration and spurring. No acute skeletal abnormality. IMPRESSION: No acute abnormality no change from the prior study. Findings suggestive of cirrhosis. No ascites or mass. Portacaval lymph node stable. Periumbilical hernia containing fat unchanged. Electronically Signed   By: Franchot Gallo M.D.   On: 03/28/2020 18:39   DG UGI W SINGLE CM (SOL OR THIN BA)  Result Date: 03/29/2020 CLINICAL DATA:  Abdominal pain EXAM: UPPER GI SERIES WITHOUT KUB  TECHNIQUE: Routine double-contrast upper GI series was performed with thick and thin barium and effervescent crystals. FLUOROSCOPY TIME:  Fluoroscopy Time:  1.6 minutes Radiation Exposure Index (if provided by the fluoroscopic device): 71.5 mGy Number of Acquired Spot Images: 19 COMPARISON:  CT 03/28/2020 FINDINGS: The esophageal motility is within normal limits. There is no evidence of stricture, mass, or ulceration. Rapid sequence imaging of the esophagus in the AP and lateral projections demonstrates  no mucosal abnormalities. The stomach distends normally without evidence of mass, ulceration, or stricture. Gastroesophageal junction is normally located without hiatal hernia. Duodenal bulb and duodenal sweep are normal in appearance and positioning without malrotation. Mild spontaneous gastroesophageal reflux into the distal esophagus on supine positioning. IMPRESSION: Mild spontaneous gastroesophageal reflux. Otherwise unremarkable double-contrast upper GI examination. No mucosal abnormality or ulceration. Electronically Signed   By: Davina Poke D.O.   On: 03/29/2020 16:30    (Echo, Carotid, EGD, Colonoscopy, ERCP)    Subjective: Seen and examined on the day of discharge.  Symptoms improved.  Patient tolerating p.o.  Stable for discharge home at this time.  Discharge Exam: Vitals:   03/30/20 1939 03/31/20 0354  BP: 105/84 (!) 104/86  Pulse: 78 62  Resp:  19  Temp: 97.7 F (36.5 C) 97.6 F (36.4 C)  SpO2: 97% 99%   Vitals:   03/30/20 0737 03/30/20 1150 03/30/20 1939 03/31/20 0354  BP: 109/76 (!) 85/67 105/84 (!) 104/86  Pulse: 87 72 78 62  Resp: 16 20  19   Temp: 97.7 F (36.5 C)  97.7 F (36.5 C) 97.6 F (36.4 C)  TempSrc: Oral   Oral  SpO2: 94% 98% 97% 99%  Weight:      Height:        General: Pt is alert, awake, not in acute distress Cardiovascular: RRR, S1/S2 +, no rubs, no gallops Respiratory: CTA bilaterally, no wheezing, no rhonchi Abdominal: Soft, NT, ND, bowel sounds + Extremities: no edema, no cyanosis    The results of significant diagnostics from this hospitalization (including imaging, microbiology, ancillary and laboratory) are listed below for reference.     Microbiology: Recent Results (from the past 240 hour(s))  SARS Coronavirus 2 by RT PCR (hospital order, performed in [facility] hospital lab) Nasopharyngeal Nasopharyngeal Swab     Status: None   Collection Time: 03/28/20  7:38 PM   Specimen: Nasopharyngeal Swab  Result Value Ref Range  Status   SARS Coronavirus 2 NEGATIVE NEGATIVE Final    Comment: (NOTE) SARS-CoV-2 target nucleic acids are NOT DETECTED.  The SARS-CoV-2 RNA is generally detectable in upper and lower respiratory specimens during the acute phase of infection. The lowest concentration of SARS-CoV-2 viral copies this assay can detect is 250 copies / mL. A negative result does not preclude SARS-CoV-2 infection and should not be used as the sole basis for treatment or other patient management decisions.  A negative result may occur with improper specimen collection / handling, submission of specimen other than nasopharyngeal swab, presence of viral mutation(s) within the areas targeted by this assay, and inadequate number of viral copies (<250 copies / mL). A negative result must be combined with clinical observations, patient history, and epidemiological information.  Fact Sheet for Patients:   StrictlyIdeas.no  Fact Sheet for Healthcare Providers: BankingDealers.co.za  This test is not yet approved or  cleared by the [place] FDA and has been authorized for detection and/or diagnosis of SARS-CoV-2 by FDA under an Emergency Use Authorization (EUA).  This EUA will remain in  effect (meaning this test can be used) for the duration of the COVID-19 declaration under Section 564(b)(1) of the Act, 21 U.S.C. section 360bbb-3(b)(1), unless the authorization is terminated or revoked sooner.  Performed at [facility], [address] [place], [place] [address]      Labs: BNP (last 3 results) Recent Labs    12/16/19 1138 02/27/20 0737 03/28/20 1710  BNP 189.0* 338.1* 973.5*   Basic Metabolic Panel: Recent Labs  Lab 03/28/20 1056 03/28/20 1710 03/29/20 0617 03/31/20 0805  NA 133*  --  136 135  K 4.3  --  4.0 3.5  CL 94*  --  95* 93*  CO2 28  --  30 30  GLUCOSE 161*  --  114* 119*  BUN 33*  --  27* 24*  CREATININE 2.17*  --  1.77*  1.84*  CALCIUM 8.9  --  9.0 8.8*  MG  --  1.2*  --   --    Liver Function Tests: Recent Labs  Lab 03/28/20 1710 03/29/20 0617  AST 34 31  ALT 35 27  ALKPHOS 159* 135*  BILITOT 1.1 0.9  PROT 8.9* 8.0  ALBUMIN 4.3 3.7   Recent Labs  Lab 03/28/20 1056  LIPASE 63*   No results for input(s): AMMONIA in the last 168 hours. CBC: Recent Labs  Lab 03/28/20 1056 03/29/20 0617 03/31/20 0805  WBC 4.1 3.9* 2.9*  NEUTROABS  --   --  1.1*  HGB 16.7 16.2 15.8  HCT 46.3 43.8 RESULTS UNAVAILABLE DUE TO INTERFERING SUBSTANCE  MCV 91.7 89.9 RESULTS UNAVAILABLE DUE TO INTERFERING SUBSTANCE  PLT 139* 138* 114*   Cardiac Enzymes: No results for input(s): CKTOTAL, CKMB, CKMBINDEX, TROPONINI in the last 168 hours. BNP: Invalid input(s): POCBNP CBG: Recent Labs  Lab 03/30/20 0735 03/30/20 1148 03/30/20 1717 03/30/20 2058 03/31/20 0757  GLUCAP 111* 159* 104* 117* 117*   D-Dimer No results for input(s): DDIMER in the last 72 hours. Hgb A1c No results for input(s): HGBA1C in the last 72 hours. Lipid Profile No results for input(s): CHOL, HDL, LDLCALC, TRIG, CHOLHDL, LDLDIRECT in the last 72 hours. Thyroid function studies No results for input(s): TSH, T4TOTAL, T3FREE, THYROIDAB in the last 72 hours.  Invalid input(s): FREET3 Anemia work up No results for input(s): VITAMINB12, FOLATE, FERRITIN, TIBC, IRON, RETICCTPCT in the last 72 hours. Urinalysis    Component Value Date/Time   COLORURINE YELLOW (A) 01/06/2020 0923   APPEARANCEUR CLEAR (A) 01/06/2020 0923   APPEARANCEUR Hazy 08/21/2014 0800   LABSPEC 1.012 01/06/2020 0923   LABSPEC 1.015 08/21/2014 0800   PHURINE 5.0 01/06/2020 0923   GLUCOSEU NEGATIVE 01/06/2020 0923   GLUCOSEU Negative 08/21/2014 0800   HGBUR NEGATIVE 01/06/2020 0923   BILIRUBINUR NEGATIVE 01/06/2020 0923   BILIRUBINUR Negative 08/21/2014 0800   KETONESUR 5 (A) 01/06/2020 0923   PROTEINUR >=300 (A) 01/06/2020 0923   NITRITE NEGATIVE 01/06/2020 0923    LEUKOCYTESUR NEGATIVE 01/06/2020 0923   LEUKOCYTESUR 1+ 08/21/2014 0800   Sepsis Labs Invalid input(s): PROCALCITONIN,  WBC,  LACTICIDVEN Microbiology Recent Results (from the past 240 hour(s))  SARS Coronavirus 2 by RT PCR (hospital order, performed in [place] hospital lab) Nasopharyngeal Nasopharyngeal Swab     Status: None   Collection Time: 03/28/20  7:38 PM   Specimen: Nasopharyngeal Swab  Result Value Ref Range Status   SARS Coronavirus 2 NEGATIVE NEGATIVE Final    Comment: (NOTE) SARS-CoV-2 target nucleic acids are NOT DETECTED.  The SARS-CoV-2 RNA is generally detectable  in upper and lower respiratory specimens during the acute phase of infection. The lowest concentration of SARS-CoV-2 viral copies this assay can detect is 250 copies / mL. A negative result does not preclude SARS-CoV-2 infection and should not be used as the sole basis for treatment or other patient management decisions.  A negative result may occur with improper specimen collection / handling, submission of specimen other than nasopharyngeal swab, presence of viral mutation(s) within the areas targeted by this assay, and inadequate number of viral copies (<250 copies / mL). A negative result must be combined with clinical observations, patient history, and epidemiological information.  Fact Sheet for Patients:   StrictlyIdeas.no  Fact Sheet for Healthcare Providers: BankingDealers.co.za  This test is not yet approved or  cleared by the [place] FDA and has been authorized for detection and/or diagnosis of SARS-CoV-2 by FDA under an Emergency Use Authorization (EUA).  This EUA will remain in effect (meaning this test can be used) for the duration of the COVID-19 declaration under Section 564(b)(1) of the Act, 21 U.S.C. section 360bbb-3(b)(1), unless the authorization is terminated or revoked sooner.  Performed at [facility], [address] [place], [place] [address]      Time coordinating discharge: Over 30 minutes  SIGNED:   Sidney Ace, MD  [facility] 03/31/2020, 1:05 PM Pager   If 7PM-7AM, please contact night-coverage

## 2020-03-31 NOTE — Discharge Instructions (Signed)
Food Choices for Gastroesophageal Reflux Disease, Adult When you have gastroesophageal reflux disease (GERD), the foods you eat and your eating habits are very important. Choosing the right foods can help ease the discomfort of GERD. Consider working with a diet and nutrition specialist (dietitian) to help you make healthy food choices. What general guidelines should I follow?  Eating plan  Choose healthy foods low in fat, such as fruits, vegetables, whole grains, low-fat dairy products, and lean meat, fish, and poultry.  Eat frequent, small meals instead of three large meals each day. Eat your meals slowly, in a relaxed setting. Avoid bending over or lying down until 2-3 hours after eating.  Limit high-fat foods such as fatty meats or fried foods.  Limit your intake of oils, butter, and shortening to less than 8 teaspoons each day.  Avoid the following: ? Foods that cause symptoms. These may be different for different people. Keep a food diary to keep track of foods that cause symptoms. ? Alcohol. ? Drinking large amounts of liquid with meals. ? Eating meals during the 2-3 hours before bed.  Cook foods using methods other than frying. This may include baking, grilling, or broiling. Lifestyle  Maintain a healthy weight. Ask your health care provider what weight is healthy for you. If you need to lose weight, work with your health care provider to do so safely.  Exercise for at least 30 minutes on 5 or more days each week, or as told by your health care provider.  Avoid wearing clothes that fit tightly around your waist and chest.  Do not use any products that contain nicotine or tobacco, such as cigarettes and e-cigarettes. If you need help quitting, ask your health care provider.  Sleep with the head of your bed raised. Use a wedge under the mattress or blocks under the bed frame to raise the head of the bed. What foods are not recommended? The items listed may not be a complete  list. Talk with your dietitian about what dietary choices are best for you. Grains Pastries or quick breads with added fat. French toast. Vegetables Deep fried vegetables. French fries. Any vegetables prepared with added fat. Any vegetables that cause symptoms. For some people this may include tomatoes and tomato products, chili peppers, onions and garlic, and horseradish. Fruits Any fruits prepared with added fat. Any fruits that cause symptoms. For some people this may include citrus fruits, such as oranges, grapefruit, pineapple, and lemons. Meats and other protein foods High-fat meats, such as fatty beef or pork, hot dogs, ribs, ham, sausage, salami and bacon. Fried meat or protein, including fried fish and fried chicken. Nuts and nut butters. Dairy Whole milk and chocolate milk. Sour cream. Cream. Ice cream. Cream cheese. Milk shakes. Beverages Coffee and tea, with or without caffeine. Carbonated beverages. Sodas. Energy drinks. Fruit juice made with acidic fruits (such as orange or grapefruit). Tomato juice. Alcoholic drinks. Fats and oils Butter. Margarine. Shortening. Ghee. Sweets and desserts Chocolate and cocoa. Donuts. Seasoning and other foods Pepper. Peppermint and spearmint. Any condiments, herbs, or seasonings that cause symptoms. For some people, this may include curry, hot sauce, or vinegar-based salad dressings. Summary  When you have gastroesophageal reflux disease (GERD), food and lifestyle choices are very important to help ease the discomfort of GERD.  Eat frequent, small meals instead of three large meals each day. Eat your meals slowly, in a relaxed setting. Avoid bending over or lying down until 2-3 hours after eating.  Limit high-fat   foods such as fatty meat or fried foods. This information is not intended to replace advice given to you by your health care provider. Make sure you discuss any questions you have with your health care provider. Document Revised:  12/09/2018 Document Reviewed: 08/19/2016 Elsevier Patient Education  2020 [employer]  

## 2020-03-31 NOTE — Progress Notes (Signed)
Oscar [name] to be D/C'd home per MD order.  Discussed prescriptions and follow up appointments with the patient. Prescriptions given to patient, medication list explained in detail. Pt verbalized understanding.  Allergies as of 03/31/2020       Reactions   Bidil [isosorb Dinitrate-hydralazine] Other (See Comments)   Migraine Headache Migraine Headache   Ciprofloxacin         Medication List     STOP taking these medications    aspirin EC 325 MG tablet   meloxicam 15 MG tablet Commonly known as: MOBIC       TAKE these medications    aluminum-magnesium hydroxide-simethicone 628-366-29 MG/5ML Susp Commonly known as: MAALOX Take 30 mLs by mouth 4 (four) times daily -  before meals and at bedtime.   Anoro Ellipta 62.5-25 MCG/INH Aepb Generic drug: umeclidinium-vilanterol Inhale 1 puff into the lungs daily in the afternoon.   atorvastatin 40 MG tablet Commonly known as: LIPITOR Take 1 tablet (40 mg total) by mouth daily at 6 PM.   clindamycin 1 % external solution Commonly known as: CLEOCIN T Apply 1 application topically 2 (two) times daily.   clopidogrel 75 MG tablet Commonly known as: Plavix Take 1 tablet (75 mg total) by mouth daily.   Entresto 49-51 MG Generic drug: sacubitril-valsartan Take 1 tablet by mouth 2 (two) times daily.   famotidine 20 MG tablet Commonly known as: PEPCID Take 1 tablet (20 mg total) by mouth 2 (two) times daily.   gabapentin 300 MG capsule Commonly known as: NEURONTIN Take 300 mg by mouth 2 (two) times daily.   Levemir FlexTouch 100 UNIT/ML FlexPen Generic drug: insulin detemir Inject 30 Units into the skin 2 (two) times daily.   lidocaine 2 % solution Commonly known as: XYLOCAINE Use as directed 15 mLs in the mouth or throat every 6 (six) hours as needed (stomach pain).   liraglutide 18 MG/3ML Sopn Commonly known as: VICTOZA Inject 18 mg into the skin daily in the afternoon.   ondansetron 4 MG disintegrating  tablet Commonly known as: Zofran ODT Take 1 tablet (4 mg total) by mouth every 8 (eight) hours as needed for nausea or vomiting.   oxyCODONE-acetaminophen 5-325 MG tablet Commonly known as: Percocet Take 1 tablet by mouth every 6 (six) hours as needed for up to 3 days for severe pain. What changed: when to take this   pantoprazole 40 MG tablet Commonly known as: Protonix Take 1 tablet (40 mg total) by mouth 2 (two) times daily.   sucralfate 1 g tablet Commonly known as: Carafate Take 1 tablet (1 g total) by mouth 4 (four) times daily.   tiotropium 18 MCG inhalation capsule Commonly known as: SPIRIVA Place 18 mcg into inhaler and inhale daily.   torsemide 20 MG tablet Commonly known as: DEMADEX Take 1 tablet (20 mg total) by mouth daily. What changed: how much to take   Ventolin HFA 108 (90 Base) MCG/ACT inhaler Generic drug: albuterol 2 inhalations every 6 (six) hours as needed   Zenpep 40000-126000 units Cpep Generic drug: Pancrelipase (Lip-Prot-Amyl) Take 2-4 capsules by mouth 5 (five) times daily. 4CAPSULE-BREAKFAST 2CAPSULE-SNACK 4CAPSULE-LUNCH 2CAPSULE-SNACK 4CAPSULE-DINNER        Vitals:   03/30/20 1939 03/31/20 0354  BP: 105/84 (!) 104/86  Pulse: 78 62  Resp:  19  Temp: 97.7 F (36.5 C) 97.6 F (36.4 C)  SpO2: 97% 99%    Skin clean, dry and intact without evidence of skin break down, no evidence of skin tears  noted. IV catheter discontinued intact. Site without signs and symptoms of complications. Dressing and pressure applied. Pt denies pain at this time. No complaints noted.  An After Visit Summary was printed and given to the patient. Patient escorted via [place], and D/C home via private auto.  [place] A Oscar [name]

## 2020-04-12 ENCOUNTER — Encounter: Payer: Self-pay | Admitting: Emergency Medicine

## 2020-04-12 ENCOUNTER — Ambulatory Visit: Payer: Medicare Other | Admitting: Family

## 2020-04-12 ENCOUNTER — Other Ambulatory Visit: Payer: Self-pay

## 2020-04-12 ENCOUNTER — Emergency Department
Admission: EM | Admit: 2020-04-12 | Discharge: 2020-04-12 | Disposition: A | Discharge status: Home / Self Care | Payer: Medicare Other | Attending: Emergency Medicine | Admitting: Emergency Medicine

## 2020-04-12 DIAGNOSIS — R1030 Lower abdominal pain, unspecified: Secondary | ICD-10-CM | POA: Insufficient documentation

## 2020-04-12 DIAGNOSIS — Z5321 Procedure and treatment not carried out due to patient leaving prior to being seen by health care provider: Secondary | ICD-10-CM | POA: Insufficient documentation

## 2020-04-12 LAB — CBC
HCT: 42.7 % (ref 39.0–52.0)
Hemoglobin: 15.6 g/dL (ref 13.0–17.0)
MCH: 32.8 pg (ref 26.0–34.0)
MCHC: 36.5 g/dL — ABNORMAL HIGH (ref 30.0–36.0)
MCV: 89.9 fL (ref 80.0–100.0)
Platelets: 170 10*3/uL (ref 150–400)
RBC: 4.75 MIL/uL (ref 4.22–5.81)
RDW: 11.8 % (ref 11.5–15.5)
WBC: 3.6 10*3/uL — ABNORMAL LOW (ref 4.0–10.5)
nRBC: 0 % (ref 0.0–0.2)

## 2020-04-12 LAB — URINALYSIS, COMPLETE (UACMP) WITH MICROSCOPIC
Bilirubin Urine: NEGATIVE
Glucose, UA: NEGATIVE mg/dL
Hgb urine dipstick: NEGATIVE
Ketones, ur: 5 mg/dL — AB
Leukocytes,Ua: NEGATIVE
Nitrite: NEGATIVE
Protein, ur: 30 mg/dL — AB
Specific Gravity, Urine: 1.006 (ref 1.005–1.030)
pH: 5 (ref 5.0–8.0)

## 2020-04-12 LAB — COMPREHENSIVE METABOLIC PANEL
ALT: 48 U/L — ABNORMAL HIGH (ref 0–44)
AST: 43 U/L — ABNORMAL HIGH (ref 15–41)
Albumin: 4.1 g/dL (ref 3.5–5.0)
Alkaline Phosphatase: 202 U/L — ABNORMAL HIGH (ref 38–126)
Anion gap: 14 (ref 5–15)
BUN: 32 mg/dL — ABNORMAL HIGH (ref 6–20)
CO2: 24 mmol/L (ref 22–32)
Calcium: 9.4 mg/dL (ref 8.9–10.3)
Chloride: 92 mmol/L — ABNORMAL LOW (ref 98–111)
Creatinine, Ser: 1.79 mg/dL — ABNORMAL HIGH (ref 0.61–1.24)
GFR calc Af Amer: 52 mL/min — ABNORMAL LOW (ref >60)
GFR calc non Af Amer: 45 mL/min — ABNORMAL LOW (ref >60)
Glucose, Bld: 112 mg/dL — ABNORMAL HIGH (ref 70–99)
Potassium: 3.9 mmol/L (ref 3.5–5.1)
Sodium: 130 mmol/L — ABNORMAL LOW (ref 135–145)
Total Bilirubin: 1.3 mg/dL — ABNORMAL HIGH (ref 0.3–1.2)
Total Protein: 8.6 g/dL — ABNORMAL HIGH (ref 6.5–8.1)

## 2020-04-12 LAB — LIPASE, BLOOD: Lipase: 95 U/L — ABNORMAL HIGH (ref 11–51)

## 2020-04-12 NOTE — ED Triage Notes (Signed)
C/O lower abdominal pain x 5 weeks.  States was seen last night through [facility] ED for same and patient left without being seen. Patient did receive fluid while in the waiting room at [facility] ED. Patient scheduled for endoscopy.    AAOx3.  Skin warm and dry. NAD

## 2020-05-01 ENCOUNTER — Encounter: Payer: Self-pay | Admitting: Family

## 2020-05-01 ENCOUNTER — Telehealth: Payer: Self-pay | Admitting: Family

## 2020-05-01 ENCOUNTER — Ambulatory Visit: Payer: Medicare Other | Attending: Family | Admitting: Family

## 2020-05-01 ENCOUNTER — Other Ambulatory Visit: Payer: Self-pay

## 2020-05-01 VITALS — BP 97/67 | Wt 260.0 lb

## 2020-05-01 DIAGNOSIS — Z794 Long term (current) use of insulin: Secondary | ICD-10-CM

## 2020-05-01 DIAGNOSIS — I5022 Chronic systolic (congestive) heart failure: Secondary | ICD-10-CM

## 2020-05-01 DIAGNOSIS — Z720 Tobacco use: Secondary | ICD-10-CM

## 2020-05-01 DIAGNOSIS — G4733 Obstructive sleep apnea (adult) (pediatric): Secondary | ICD-10-CM

## 2020-05-01 DIAGNOSIS — I10 Essential (primary) hypertension: Secondary | ICD-10-CM

## 2020-05-01 NOTE — Progress Notes (Signed)
Virtual Visit via Telephone Note   Evaluation Performed:  Follow-up visit  This visit type was conducted due to national recommendations for restrictions regarding the COVID-19 Pandemic (e.g. social distancing).  This format is felt to be most appropriate for this patient at this time.  All issues noted in this document were discussed and addressed.  No physical exam was performed (except for noted visual exam findings with Video Visits).  Please refer to the patient's chart (MyChart message for video visits and phone note for telephone visits) for the patient's consent to telehealth for [facility]  Date:  05/01/2020   ID:  Oscar [name], DOB 02-Apr-1976, MRN [id number]  Patient Location:  [address] [place] [address]   Provider location:   [facility] [place] [address] [place],  [address]  PCP:  [facility] [place]  Cardiologist:  No primary care provider on file.  Electrophysiologist:  None   Chief Complaint:  Shortness of breath  History of Present Illness:    Oscar [name] is a 44 y.o. male who presents via audio/video conferencing for a telehealth visit today.  Patient verified DOB and address.  The patient does not have symptoms concerning for COVID-19 infection (fever, chills, cough, or new SHORTNESS OF BREATH). Patient reports minimal shortness of breath upon moderate exertion. He describes this as chronic in nature having been present for several years. He has associated weight loss, fatigue, cough, dizziness and abdominal pain along with this. He denies any pedal edema, palpitations, chest pain or difficulty sleeping.   Biggest concern is of continued abdominal pain and inability to eat any food. He says that he's drinking boost, pedialyte and liquids in general to try and stay hydrated but continues to lose weight. Says that he had a recent UGI done but that he's still waiting to hear what the plan is.  Hasn't been taking a lot of his medications because he's not eating. BP has been dropping as well.   Had covid exposure and went and got tested today.   Prior CV studies:   The following studies were reviewed today:  Echo report from 02/27/20 reviewed and showed an EF of <20%.   Past Medical History:  Diagnosis Date  . AICD (automatic cardioverter/defibrillator) present   . Asthma   . Cardiomyopathy ([place])   . CHF (congestive heart failure) ([place])   . Coronary artery disease   . Deafness in right ear   . Diabetes mellitus without complication ([place])   . Dilated cardiomyopathy ([place])   . Dysrhythmia    svt  . 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 GERD (gastroesophageal reflux disease)   . Hyperlipidemia   . Hypertension   . Hypoxemia   . Hypoxemia   . Mild obesity   . Myocardial infarction ([place])    8242,3536,1/44  . Pancreatitis   . Sleep apnea    osa   Past Surgical History:  Procedure Laterality Date  . CARDIAC CATHETERIZATION  12/11/2014   Procedure: RIGHT/LEFT HEART CATH AND CORONARY ANGIOGRAPHY;  Surgeon: Lorretta Harp, MD;  Location: [facility] CATH LAB;  Service: Cardiovascular;;  . ICD LEAD REMOVAL N/A 03/30/2015   Procedure: ICD LEAD REMOVAL;  Surgeon: Marzetta Board, MD;  Location: [facility] ORS;  Service: Cardiovascular;  Laterality: N/A;  . IMPLANTABLE CARDIOVERTER DEFIBRILLATOR IMPLANT    . INSERT / REPLACE / REMOVE PACEMAKER    . LEFT HEART CATHETERIZATION WITH CORONARY  ANGIOGRAM N/A 12/09/2014   Procedure: LEFT HEART CATHETERIZATION WITH CORONARY ANGIOGRAM;  Surgeon: Burnell Blanks, MD;  Location: [facility] CATH LAB;  Service: Cardiovascular;  Laterality: N/A;     No outpatient medications have been marked as taking for the 05/01/20 encounter (Appointment) with Alisa Graff, FNP.     Allergies:   Bidil [isosorb dinitrate-hydralazine] and Ciprofloxacin   Social History   Tobacco Use  . Smoking status: Current Every Day Smoker    Packs/day: 0.50    Years:  24.00    Pack years: 12.00    Types: Cigarettes  . Smokeless tobacco: Never Used  . Tobacco comment: 4/5 Smoking 5 cigs a day  Vaping Use  . Vaping Use: Never used  Substance Use Topics  . Alcohol use: No  . Drug use: No     Family Hx: The patient's family history includes COPD in his sister; Congestive Heart Failure in his mother; Diabetes in his sister; Hypertension in his mother and sister; Pancreatitis in his brother and sister. There is no history of Anemia, Arrhythmia, Asthma, Clotting disorder, Fainting, Heart attack, Heart disease, Heart failure, or Hyperlipidemia.  Prior to Admission medications   Medication Sig Start Date End Date Taking? Authorizing Provider  albuterol (VENTOLIN HFA) 108 (90 Base) MCG/ACT inhaler 2 inhalations every 6 (six) hours as needed 01/21/19  Yes [provider]  famotidine (PEPCID) 20 MG tablet Take 1 tablet (20 mg total) by mouth 2 (two) times daily. 03/31/20 05/01/20 Yes Sreenath, Sudheer B, MD  furosemide (LASIX) 20 MG tablet Take 20 mg by mouth daily.   Yes [provider]  insulin detemir (LEVEMIR FLEXTOUCH) 100 UNIT/ML FlexPen Inject 30 Units into the skin 2 (two) times daily.  11/10/18  Yes [provider]  pantoprazole (PROTONIX) 40 MG tablet Take 1 tablet (40 mg total) by mouth 2 (two) times daily. 03/31/20 05/01/20 Yes Sreenath, Sudheer B, MD  sacubitril-valsartan (ENTRESTO) 49-51 MG Take 1 tablet by mouth 2 (two) times daily.  06/13/19  Yes [provider]  tiotropium (SPIRIVA) 18 MCG inhalation capsule Place 18 mcg into inhaler and inhale daily.   Yes [provider]  umeclidinium-vilanterol (ANORO ELLIPTA) 62.5-25 MCG/INH AEPB Inhale 1 puff into the lungs daily in the afternoon.  01/31/19  Yes [provider]  atorvastatin (LIPITOR) 40 MG tablet Take 1 tablet (40 mg total) by mouth daily at 6 PM. Patient not taking: Reported on 05/01/2020 11/04/18   Alisa Graff, FNP  clindamycin (CLEOCIN T) 1 %  external solution Apply 1 application topically 2 (two) times daily.  02/16/20   [provider]  clopidogrel (PLAVIX) 75 MG tablet Take 1 tablet (75 mg total) by mouth daily. Patient not taking: Reported on 05/01/2020 02/28/20   Clabe Seal, PA-C  gabapentin (NEURONTIN) 300 MG capsule Take 300 mg by mouth 2 (two) times daily. Patient not taking: Reported on 05/01/2020    [provider]  lidocaine (XYLOCAINE) 2 % solution Use as directed 15 mLs in the mouth or throat every 6 (six) hours as needed (stomach pain). Patient not taking: Reported on 05/01/2020 12/29/19   Nance Pear, MD  liraglutide (VICTOZA) 18 MG/3ML SOPN Inject 18 mg into the skin daily in the afternoon. Patient not taking: Reported on 05/01/2020    [provider]  ondansetron (ZOFRAN ODT) 4 MG disintegrating tablet Take 1 tablet (4 mg total) by mouth every 8 (eight) hours as needed for nausea or vomiting. Patient not taking: Reported on 02/27/2020 01/06/20  Blake Divine, MD  sucralfate (CARAFATE) 1 g tablet Take 1 tablet (1 g total) by mouth 4 (four) times daily. Patient not taking: Reported on 05/01/2020 03/31/20 04/30/20  Sidney Ace, MD  ZENPEP [phone] units CPEP Take 2-4 capsules by mouth 5 (five) times daily. [place] 4CAPSULE-DINNER Patient not taking: Reported on 05/01/2020 03/20/20   [provider]    ROS:   Please see the history of present illness.     All other systems reviewed and are negative.   Labs/Other Tests and Data Reviewed:    Recent Labs: 03/28/2020: B Natriuretic Peptide 264.6; Magnesium 1.2 04/12/2020: ALT 48; BUN 32; Creatinine, Ser 1.79; Hemoglobin 15.6; Platelets 170; Potassium 3.9; Sodium 130   Recent Lipid Panel Lab Results  Component Value Date/Time   CHOL 247 (H) 09/12/2016 07:16 AM   CHOL 237 (H) 08/11/2014 08:38 AM   TRIG 157 (H) 05/19/2017 03:06 PM   TRIG 101 08/11/2014 08:38 AM    HDL 31 (L) 09/12/2016 07:16 AM   HDL 31 (L) 08/11/2014 08:38 AM   CHOLHDL 8.0 09/12/2016 07:16 AM   LDLCALC 177 (H) 09/12/2016 07:16 AM   LDLCALC 186 (H) 08/11/2014 08:38 AM    Wt Readings from Last 3 Encounters:  04/12/20 (!) 308 lb 3.3 oz (139.8 kg)  03/28/20 (!) 308 lb 3.3 oz (139.8 kg)  02/28/20 (!) 308 lb 3.2 oz (139.8 kg)     Exam:    Vital Signs:  There were no vitals taken for this visit.   Well nourished, well developed male in no  acute distress.   ASSESSMENT & PLAN:    1.  1: Chronic heart failure with reduced ejection fraction- - NYHA class II - euvolemic today per his report - weighing daily; Reminded to call for an overnight weight gain of >2 pounds or a weekly weight gain of >5 pounds.  - he reports continued weight loss due to inability to eat food without causing abdominal pain - BP limits titration of medications; advised to cut entresto in half and take 1/2 tablet BID if SBP is <100 - last saw cardiology Dema Severin) 12/16/19 - bidil gave him migraine headache - BNP on 03/28/20 was 264.4.0 - AICD in place - has received both COVID vaccines; got tested earlier today due to known exposure  2: HTN- - recent BP yesterday was 97/67; to adjust entresto per above if SBP is <100 - sees PCP [name]) at [facility] next week - BMP from 04/12/20 reviewed and shows sodium 130, potassium 3.9, creatinine 1.79 & GFR 52  3: Obstructive sleep apnea- - not wearing CPAP anymore due to comfort level  - continues wearing oxygen at 3L at bedtime   - had sleep study done last night  4: Diabetes- - A1c from 02/27/20 was 8.5%  5: Tobacco use- - continues to smoke 1/2 ppd cigarettes daily - does remove himself from the oxygen when smoking - complete cessation discussed for 3 minutes with him   COVID-19 Education: The signs and symptoms of COVID-19 were discussed with the patient and how to seek care for testing (follow up with PCP or arrange E-visit).   The importance of social distancing was discussed today.  Patient Risk:   After full review of this patients clinical status, I feel that they are at least moderate risk at this time.  Time:   Today, I have spent 12 minutes with the patient with telehealth technology discussing medications, weight and symptoms to  report.     Medication Adjustments/Labs and Tests Ordered: Current medicines are reviewed at length with the patient today.  Concerns regarding medicines are outlined above.   Tests Ordered: No orders of the defined types were placed in this encounter.  Medication Changes: No orders of the defined types were placed in this encounter.   Disposition:  Follow-up in 3 months or sooner for any questions/problems before then.   Signed, Alisa Graff, FNP  05/01/2020 11:52 AM    [facility]

## 2020-05-01 NOTE — Telephone Encounter (Signed)
Virtual Visit Pre-Appointment Phone Call  "Oscar [name], I am calling you today to discuss your upcoming appointment. We are currently trying to limit exposure to the virus that causes COVID-19 by seeing patients at home rather than in the office."  1. "What is the BEST phone number to call the day of the visit?" - include this in appointment notes  2. Do you have or have access to (through a family member/friend) a smartphone with video capability that we can use for your visit?" a. If yes - list this number in appt notes as cell (if different from BEST phone #) and list the appointment type as a VIDEO visit in appointment notes b. If no - list the appointment type as a PHONE visit in appointment notes  3. Confirm consent - "In the setting of the current Covid19 crisis, you are scheduled for a (phone or video) visit with your provider on (date) at (time).  Just as we do with many in-office visits, in order for you to participate in this visit, we must obtain consent.  If you'd like, I can send this to your mychart (if signed up) or email for you to review.  Otherwise, I can obtain your verbal consent now.  All virtual visits are billed to your insurance company just like a normal visit would be.  By agreeing to a virtual visit, we'd like you to understand that the technology does not allow for your provider to perform an examination, and thus may limit your provider's ability to fully assess your condition. If your provider identifies any concerns that need to be evaluated in person, we will make arrangements to do so.  Finally, though the technology is pretty good, we cannot assure that it will always work on either your or our end, and in the setting of a video visit, we may have to convert it to a phone-only visit.  In either situation, we cannot ensure that we have a secure connection.  Are you willing to proceed?" STAFF: Did the patient verbally acknowledge consent to telehealth visit? Document  YES/NO here: YES  4. Advise patient to be prepared - "Two hours prior to your appointment, go ahead and check your blood pressure, pulse, oxygen saturation, and your weight (if you have the equipment to check those) and write them all down. When your visit starts, your provider will ask you for this information. If you have an Apple Watch or Kardia device, please plan to have heart rate information ready on the day of your appointment. Please have a pen and paper handy nearby the day of the visit as well."  5. Give patient instructions for MyChart download to smartphone OR Doximity/Doxy.me as below if video visit (depending on what platform provider is using)  6. Inform patient they will receive a phone call 15 minutes prior to their appointment time (may be from unknown caller ID) so they should be prepared to answer    TELEPHONE CALL NOTE  MARSHEL GOLUBSKI has been deemed a candidate for a follow-up tele-health visit to limit community exposure during the Covid-19 pandemic. I spoke with the patient via phone to ensure availability of phone/video source, confirm preferred email & phone number, and discuss instructions and expectations.  I reminded Thurl Boen Dassow to be prepared with any vital sign and/or heart rhythm information that could potentially be obtained via home monitoring, at the time of his visit. I reminded ORAN DILLENBURG to expect a phone call prior to  his visit.  Alisa Graff, [place] 05/01/2020 11:53 AM   INSTRUCTIONS FOR DOWNLOADING THE MYCHART APP TO SMARTPHONE  - The patient must first make sure to have activated MyChart and know their login information - If Apple, go to [employer] and type in MyChart in the search bar and download the app. If Android, ask patient to go to [employer] and type in [place] in the search bar and download the app. The app is free but as with any other app downloads, their phone may require them to verify saved payment information or  Apple/Android password.  - The patient will need to then log into the app with their MyChart username and password, and select  as their healthcare provider to link the account. When it is time for your visit, go to the MyChart app, find appointments, and click Begin Video Visit. Be sure to Select Allow for your device to access the Microphone and Camera for your visit. You will then be connected, and your provider will be with you shortly.  **If they have any issues connecting, or need assistance please contact MyChart service desk [phone])**  **If using a computer, in order to ensure the best quality for their visit they will need to use either of the following Internet Browsers: [employer], or Google Chrome**  IF USING DOXIMITY or DOXY.ME - The patient will receive a link just prior to their visit by text.     FULL LENGTH CONSENT FOR TELE-HEALTH VISIT   I hereby voluntarily request, consent and authorize [facility] and its employed or contracted physicians, physician assistants, nurse practitioners or other licensed health care professionals (the Practitioner), to provide me with telemedicine health care services (the Services") as deemed necessary by the treating Practitioner. I acknowledge and consent to receive the Services by the Practitioner via telemedicine. I understand that the telemedicine visit will involve communicating with the Practitioner through live audiovisual communication technology and the disclosure of certain medical information by electronic transmission. I acknowledge that I have been given the opportunity to request an in-person assessment or other available alternative prior to the telemedicine visit and am voluntarily participating in the telemedicine visit.  I understand that I have the right to withhold or withdraw my consent to the use of telemedicine in the course of my care at any time, without affecting my right to future care or  treatment, and that the Practitioner or I may terminate the telemedicine visit at any time. I understand that I have the right to inspect all information obtained and/or recorded in the course of the telemedicine visit and may receive copies of available information for a reasonable fee.  I understand that some of the potential risks of receiving the Services via telemedicine include:   Delay or interruption in medical evaluation due to technological equipment failure or disruption;  Information transmitted may not be sufficient (e.g. poor resolution of images) to allow for appropriate medical decision making by the Practitioner; and/or   In rare instances, security protocols could fail, causing a breach of personal health information.  Furthermore, I acknowledge that it is my responsibility to provide information about my medical history, conditions and care that is complete and accurate to the best of my ability. I acknowledge that Practitioner's advice, recommendations, and/or decision may be based on factors not within their control, such as incomplete or inaccurate data provided by me or distortions of diagnostic images or specimens that may result from electronic transmissions.  I understand that the practice of medicine is not an exact science and that Practitioner makes no warranties or guarantees regarding treatment outcomes. I acknowledge that I will receive a copy of this consent concurrently upon execution via email to the email address I last provided but may also request a printed copy by calling the office of [facility].    I understand that my insurance will be billed for this visit.   I have read or had this consent read to me.  I understand the contents of this consent, which adequately explains the benefits and risks of the Services being provided via telemedicine.   I have been provided ample opportunity to ask questions regarding this consent and the Services and  have had my questions answered to my satisfaction.  I give my informed consent for the services to be provided through the use of telemedicine in my medical care  By participating in this telemedicine visit I agree to the above.

## 2020-05-10 ENCOUNTER — Ambulatory Visit: Payer: Medicare Other | Admitting: Podiatry

## 2020-05-17 ENCOUNTER — Other Ambulatory Visit: Payer: Self-pay

## 2020-05-17 ENCOUNTER — Ambulatory Visit (INDEPENDENT_AMBULATORY_CARE_PROVIDER_SITE_OTHER): Payer: Medicare Other | Admitting: Podiatry

## 2020-05-17 ENCOUNTER — Ambulatory Visit: Payer: Medicare Other | Admitting: Podiatry

## 2020-05-17 ENCOUNTER — Encounter: Payer: Self-pay | Admitting: Podiatry

## 2020-05-17 DIAGNOSIS — B351 Tinea unguium: Secondary | ICD-10-CM

## 2020-05-17 DIAGNOSIS — E119 Type 2 diabetes mellitus without complications: Secondary | ICD-10-CM | POA: Diagnosis not present

## 2020-05-17 DIAGNOSIS — M79676 Pain in unspecified toe(s): Secondary | ICD-10-CM

## 2020-05-17 DIAGNOSIS — Z794 Long term (current) use of insulin: Secondary | ICD-10-CM

## 2020-05-17 NOTE — Progress Notes (Signed)
Complaint:  Visit Type: Patient returns to my office for continued preventative foot care services. Complaint: Patient states" my nails have grown long and thick and become painful to walk and wear shoes" Patient has been diagnosed with DM with no foot complications. The patient presents for preventative foot care services. No changes to ROS  Podiatric Exam: Vascular: dorsalis pedis and posterior tibial pulses are palpable bilateral. Capillary return is immediate. Temperature gradient is WNL. Skin turgor WNL  Sensorium: Normal Semmes Weinstein monofilament test. Normal tactile sensation bilaterally. Nail Exam: Pt has thick disfigured discolored nails with subungual debris noted bilateral entire nail hallux through fifth toenails Ulcer Exam: There is no evidence of ulcer or pre-ulcerative changes or infection. Orthopedic Exam: Muscle tone and strength are WNL. No limitations in general ROM. No crepitus or effusions noted. Foot type and digits show no abnormalities. 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 Skin: No Porokeratosis. No infection or ulcers.  Heel calus  B/L.  Diagnosis:  Onychomycosis, , Pain in right toe, pain in left toes  Treatment & Plan Procedures and Treatment: Consent by patient was obtained for treatment procedures. The patient understood the discussion of treatment and procedures well. All questions were answered thoroughly reviewed. Debridement of mycotic and hypertrophic toenails, 1 through 5 bilateral and clearing of subungual debris. No ulceration, no infection noted.  Return Visit-Office Procedure: Patient instructed to return to the office for a follow up visit 3 months  for continued evaluation and treatment.    [name] DPM

## 2020-07-03 NOTE — Progress Notes (Deleted)
Patient ID: Oscar [name], male    DOB: 1975-12-22, 44 y.o.   MRN: [id number]  HPI  Oscar [name] is a 44 y/o male with a history of obstructive sleep apnea (currently with CPAP), MI, HTN, hyperlipidemia, GERD, DM, SVT, asthma and chronic heart failure.  Echo done 02/27/20 reviewed and showed an EF of <20%. Echo done 03/01/2019 which showed an EF of 15% along with mild Oscar. Echo done 06/23/18 reviewed and showed an EF of 20-25% along with mild Oscar. Echo done 02/06/15 which showed an EF of 25% which is unchanged from previous echo on 12/10/14 which showed an EF of 25% and mild Oscar.   Was in the ED 04/11/20 due to abdominal/ epigastric pain where he was treated and released. Was in the ED 01/08/20 due to abdominal/ flank pain. Given IVF. CXR/ abdominal & pelvic CT obtained. Symptoms slightly improved and patient requested discharge. Was in the ED 01/06/20 abdominal/ epigastric pain. CT was negative for acute process. Given IV medications and symptoms improved and he was released.      He presents today for a follow-up visit with a chief complaint of   Past Medical History:  Diagnosis Date  . AICD (automatic cardioverter/defibrillator) present   . Asthma   . Cardiomyopathy ([place])   . CHF (congestive heart failure) ([place])   . Coronary artery disease   . Deafness in right ear   . Diabetes mellitus without complication ([place])   . Dilated cardiomyopathy ([place])   . Dysrhythmia    svt  . 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 GERD (gastroesophageal reflux disease)   . Hyperlipidemia   . Hypertension   . Hypoxemia   . Hypoxemia   . Mild obesity   . Myocardial infarction ([place])    7989,2119,4/17  . Pancreatitis   . Sleep apnea    osa   Past Surgical History:  Procedure Laterality Date  . CARDIAC CATHETERIZATION  12/11/2014   Procedure: RIGHT/LEFT HEART CATH AND CORONARY ANGIOGRAPHY;  Surgeon: Lorretta Harp, MD;  Location: [facility] CATH LAB;  Service: Cardiovascular;;  . ICD LEAD REMOVAL N/A  03/30/2015   Procedure: ICD LEAD REMOVAL;  Surgeon: Marzetta Board, MD;  Location: [facility] ORS;  Service: Cardiovascular;  Laterality: N/A;  . IMPLANTABLE CARDIOVERTER DEFIBRILLATOR IMPLANT    . INSERT / REPLACE / REMOVE PACEMAKER    . LEFT HEART CATHETERIZATION WITH CORONARY ANGIOGRAM N/A 12/09/2014   Procedure: LEFT HEART CATHETERIZATION WITH CORONARY ANGIOGRAM;  Surgeon: Burnell Blanks, MD;  Location[facility] C[facility] LAB;  Service: Cardiovascular;  Laterality: N/A;   Family History  Problem Relation Age of Onset  . Hypertension Mother   . Congestive Heart Failure Mother   . Hypertension Sister   . Diabetes Sister   . Pancreatitis Sister   . COPD Sister   . Pancreatitis Brother   . Anemia Neg Hx   . Arrhythmia Neg Hx   . Asthma Neg Hx   . Clotting disorder Neg Hx   . Fainting Neg Hx   . Heart attack Neg Hx   . Heart disease Neg Hx   . Heart failure Neg Hx   . Hyperlipidemia Neg Hx    Social History   Tobacco Use  . Smoking status: Current Every Day Smoker    Packs/day: 0.50    Years: 24.00    Pack years: 12.00    Types: Cigarettes  . Smokeless tobacco: Never Used  . Tobacco comment: 4/5 Smoking 5  cigs a day  Substance Use Topics  . Alcohol use: No   Allergies  Allergen Reactions  . Bidil [Isosorb Dinitrate-Hydralazine] Other (See Comments)    Migraine Headache Migraine Headache  . Ciprofloxacin      Review of Systems  Constitutional: Positive for fatigue. Negative for appetite change.  HENT: Negative for congestion, postnasal drip and sore throat.   Eyes: Negative.   Respiratory: Positive for cough and shortness of breath. Negative for chest tightness and wheezing.   Cardiovascular: Positive for palpitations. Negative for chest pain and leg swelling.  Gastrointestinal: Positive for abdominal distention (at times) and abdominal pain (at times). Negative for diarrhea and nausea.  Endocrine: Negative.   Genitourinary: Negative.   Musculoskeletal: Positive for back  pain. Negative for arthralgias.  Skin: Negative.   Allergic/Immunologic: Negative.   Neurological: Positive for light-headedness (at times). Negative for dizziness and headaches.  Hematological: Negative for adenopathy. Does not bruise/bleed easily.  Psychiatric/Behavioral: Negative for dysphoric mood, sleep disturbance (sleeping on 4 pillows) and suicidal ideas. The patient is not nervous/anxious.       Physical Exam Vitals and nursing note reviewed.  Constitutional:      Appearance: He is well-developed.  HENT:     Head: Normocephalic and atraumatic.  Neck:     Vascular: No JVD.  Cardiovascular:     Rate and Rhythm: Normal rate and regular rhythm.  Pulmonary:     Effort: Pulmonary effort is normal.     Breath sounds: No wheezing or rales.  Abdominal:     General: There is no distension.     Palpations: Abdomen is soft.     Tenderness: There is no abdominal tenderness.  Musculoskeletal:        General: No tenderness.     Cervical back: Normal range of motion and neck supple.  Skin:    General: Skin is warm and dry.  Neurological:     Mental Status: He is alert and oriented to person, place, and time.  Psychiatric:        Behavior: Behavior normal.        Thought Content: Thought content normal.      Assessment & Plan:  1: Chronic heart failure with reduced ejection fraction- - NYHA class II - euvolemic today - weighing daily; Reminded to call for an overnight weight gain of >2 pounds or a weekly weight gain of >5 pounds.  - weight 306.8 pounds from last visit here 6 months ago - BP limits titration of medications - last saw cardiology [name]) 06/11/20 - bidil gave him migraine headache - BNP on 03/28/20 was 264.6 - insulin dependent so may not be able to use farxiga - AICD in place - has received his first COVID vaccine  2: HTN- - BP  - sees PCP [name]) at [place] from 04/12/20 reviewed and shows sodium 130, potassium  3.9, creatinine 1.79 & GFR 52  3: Obstructive sleep apnea- - not wearing CPAP anymore due to comfort level  - continues wearing oxygen at 3L at bedtime    4: Diabetes- - fasting glucose at home was  - A1c from 02/27/20 was 8.5%  5: Tobacco use- - continues to smoke 1/2 ppd cigarettes daily - does remove himself from the oxygen when smoking - complete cessation discussed for 3 minutes with him  6: Pancreatitis- - recent stomach MRI was negative. - has appointment at [facility] biliary clinic in a couple of weeks   Patient did not  bring his medications nor a list. Emphasized that he needed to bring his medication bottles to every visit with him.

## 2020-07-04 ENCOUNTER — Ambulatory Visit: Payer: Medicare Other | Admitting: Family

## 2020-07-05 ENCOUNTER — Encounter: Payer: Self-pay | Admitting: Family

## 2020-07-05 ENCOUNTER — Other Ambulatory Visit: Payer: Self-pay

## 2020-07-05 ENCOUNTER — Ambulatory Visit: Payer: Medicare Other | Attending: Family | Admitting: Family

## 2020-07-05 VITALS — BP 128/89 | HR 88 | Resp 18 | Ht 66.0 in | Wt 296.4 lb

## 2020-07-05 DIAGNOSIS — I5022 Chronic systolic (congestive) heart failure: Secondary | ICD-10-CM

## 2020-07-05 DIAGNOSIS — F17210 Nicotine dependence, cigarettes, uncomplicated: Secondary | ICD-10-CM | POA: Insufficient documentation

## 2020-07-05 DIAGNOSIS — G4733 Obstructive sleep apnea (adult) (pediatric): Secondary | ICD-10-CM | POA: Insufficient documentation

## 2020-07-05 DIAGNOSIS — I2510 Atherosclerotic heart disease of native coronary artery without angina pectoris: Secondary | ICD-10-CM | POA: Insufficient documentation

## 2020-07-05 DIAGNOSIS — Z79899 Other long term (current) drug therapy: Secondary | ICD-10-CM | POA: Diagnosis not present

## 2020-07-05 DIAGNOSIS — I110 Hypertensive heart disease with heart failure: Secondary | ICD-10-CM | POA: Diagnosis not present

## 2020-07-05 DIAGNOSIS — Z95810 Presence of automatic (implantable) cardiac defibrillator: Secondary | ICD-10-CM | POA: Insufficient documentation

## 2020-07-05 DIAGNOSIS — E669 Obesity, unspecified: Secondary | ICD-10-CM | POA: Diagnosis not present

## 2020-07-05 DIAGNOSIS — E119 Type 2 diabetes mellitus without complications: Secondary | ICD-10-CM | POA: Diagnosis not present

## 2020-07-05 DIAGNOSIS — I252 Old myocardial infarction: Secondary | ICD-10-CM | POA: Insufficient documentation

## 2020-07-05 DIAGNOSIS — Z720 Tobacco use: Secondary | ICD-10-CM

## 2020-07-05 DIAGNOSIS — E785 Hyperlipidemia, unspecified: Secondary | ICD-10-CM | POA: Insufficient documentation

## 2020-07-05 DIAGNOSIS — K861 Other chronic pancreatitis: Secondary | ICD-10-CM

## 2020-07-05 DIAGNOSIS — I509 Heart failure, unspecified: Secondary | ICD-10-CM | POA: Insufficient documentation

## 2020-07-05 DIAGNOSIS — Z794 Long term (current) use of insulin: Secondary | ICD-10-CM

## 2020-07-05 DIAGNOSIS — I10 Essential (primary) hypertension: Secondary | ICD-10-CM

## 2020-07-05 DIAGNOSIS — K859 Acute pancreatitis without necrosis or infection, unspecified: Secondary | ICD-10-CM | POA: Diagnosis not present

## 2020-07-05 NOTE — Progress Notes (Signed)
Patient ID: Oscar [name], male    DOB: 22-Feb-1976, 44 y.o.   MRN: [id number]  HPI  Mr Boettner is a 44 y/o male with a history of obstructive sleep apnea (currently with CPAP), MI, HTN, hyperlipidemia, GERD, DM, SVT, asthma and chronic heart failure.  Echo done 02/27/20 reviewed and showed an EF of <20%. Echo done 03/01/2019 which showed an EF of 15% along with mild MR. Echo done 06/23/18 reviewed and showed an EF of 20-25% along with mild MR. Echo done 02/06/15 which showed an EF of 25% which is unchanged from previous echo on 12/10/14 which showed an EF of 25% and mild MR.   Was in the ED 04/11/20 due to abdominal/ epigastric pain where he was treated and released. Was in the ED 01/08/20 due to abdominal/ flank pain. Given IVF. CXR/ abdominal & pelvic CT obtained. Symptoms slightly improved and patient requested discharge. Was in the ED 01/06/20 abdominal/ epigastric pain. CT was negative for acute process. Given IV medications and symptoms improved and he was released.      He presents today for a follow-up visit with a chief complaint of minimal shortness of breath upon moderate exertion. He describes this as chronic in nature having been present for several years. He has associated fatigue, cough, palpitations, abdominal distention (intermittent), abdominal pain and back pain along with this. He denies any difficulty sleeping, dizziness, pedal edema, chest pain, wheezing or weight gain.   Says that due to his continued pancreatitis and not knowing what exactly is causing his abdominal pain etc, he stopped the majority of his medications and says that he feels better. He's currently only taking his entresto, torsemide & inhalers. Hasn't taken clopidogrel in "a long time". Says that he's no longer using his insulin and glucose levels stay between 120-140.   Past Medical History:  Diagnosis Date  . AICD (automatic cardioverter/defibrillator) present   . Asthma   . Cardiomyopathy ([place])   . CHF  (congestive heart failure) ([place])   . Coronary artery disease   . Deafness in right ear   . Diabetes mellitus without complication ([place])   . Dilated cardiomyopathy ([place])   . Dysrhythmia    svt  . 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 GERD (gastroesophageal reflux disease)   . Hyperlipidemia   . Hypertension   . Hypoxemia   . Hypoxemia   . Mild obesity   . Myocardial infarction ([place])    6734,1937,9/02  . Pancreatitis   . Sleep apnea    osa   Past Surgical History:  Procedure Laterality Date  . CARDIAC CATHETERIZATION  12/11/2014   Procedure: RIGHT/LEFT HEART CATH AND CORONARY ANGIOGRAPHY;  Surgeon: Lorretta Harp, MD;  Location: [facility] CATH LAB;  Service: Cardiovascular;;  . ICD LEAD REMOVAL N/A 03/30/2015   Procedure: ICD LEAD REMOVAL;  Surgeon: Marzetta Board, MD;  Location: [facility] ORS;  Service: Cardiovascular;  Laterality: N/A;  . IMPLANTABLE CARDIOVERTER DEFIBRILLATOR IMPLANT    . INSERT / REPLACE / REMOVE PACEMAKER    . LEFT HEART CATHETERIZATION WITH CORONARY ANGIOGRAM N/A 12/09/2014   Procedure: LEFT HEART CATHETERIZATION WITH CORONARY ANGIOGRAM;  Surgeon: Burnell Blanks, MD;  Location: [facility] CATH LAB;  Service: Cardiovascular;  Laterality: N/A;   Family History  Problem Relation Age of Onset  . Hypertension Mother   . Congestive Heart Failure Mother   . Hypertension Sister   . Diabetes Sister   . Pancreatitis Sister   . COPD Sister   .  Pancreatitis Brother   . Anemia Neg Hx   . Arrhythmia Neg Hx   . Asthma Neg Hx   . Clotting disorder Neg Hx   . Fainting Neg Hx   . Heart attack Neg Hx   . Heart disease Neg Hx   . Heart failure Neg Hx   . Hyperlipidemia Neg Hx    Social History   Tobacco Use  . Smoking status: Current Every Day Smoker    Packs/day: 0.50    Years: 24.00    Pack years: 12.00    Types: Cigarettes  . Smokeless tobacco: Never Used  . Tobacco comment: 4/5 Smoking 5 cigs a day  Substance Use Topics  . Alcohol use: No   Allergies   Allergen Reactions  . Bidil [Isosorb Dinitrate-Hydralazine] Other (See Comments)    Migraine Headache Migraine Headache  . Ciprofloxacin    Prior to Admission medications   Medication Sig Start Date End Date Taking? Authorizing Provider  albuterol (VENTOLIN HFA) 108 (90 Base) MCG/ACT inhaler 2 inhalations every 6 (six) hours as needed 01/21/19  Yes [provider]  metoprolol succinate (TOPROL-XL) 25 MG 24 hr tablet Take 25 mg by mouth daily.   Yes [provider]  sacubitril-valsartan (ENTRESTO) 49-51 MG Take 1 tablet by mouth 2 (two) times daily.  06/13/19  Yes [provider]  tiotropium (SPIRIVA) 18 MCG inhalation capsule Place 18 mcg into inhaler and inhale daily.   Yes [provider]  torsemide (DEMADEX) 20 MG tablet Take 40 mg by mouth daily.   Yes [provider]  umeclidinium-vilanterol (ANORO ELLIPTA) 62.5-25 MCG/INH AEPB Inhale 1 puff into the lungs daily in the afternoon.  01/31/19  Yes [provider]  atorvastatin (LIPITOR) 40 MG tablet Take 1 tablet (40 mg total) by mouth daily at 6 PM. Patient not taking: Reported on 07/05/2020 11/04/18   Alisa Graff, FNP  clindamycin (CLEOCIN T) 1 % external solution Apply 1 application topically 2 (two) times daily.  Patient not taking: Reported on 07/05/2020 02/16/20   [provider]  clopidogrel (PLAVIX) 75 MG tablet Take 1 tablet (75 mg total) by mouth daily. Patient not taking: Reported on 07/05/2020 02/28/20   Clabe Seal, PA-C  famotidine (PEPCID) 20 MG tablet Take 1 tablet (20 mg total) by mouth 2 (two) times daily. Patient not taking: Reported on 07/05/2020 03/31/20 05/01/20  Sidney Ace, MD  gabapentin (NEURONTIN) 300 MG capsule Take 300 mg by mouth 2 (two) times daily.  Patient not taking: Reported on 07/05/2020    [provider]  insulin detemir (LEVEMIR FLEXTOUCH) 100 UNIT/ML FlexPen Inject 30 Units into the skin 2 (two) times daily.  Patient not taking:  Reported on 07/05/2020 11/10/18   [provider]  lidocaine (XYLOCAINE) 2 % solution Use as directed 15 mLs in the mouth or throat every 6 (six) hours as needed (stomach pain). Patient not taking: Reported on 07/05/2020 12/29/19   Nance Pear, MD  liraglutide (VICTOZA) 18 MG/3ML SOPN Inject 18 mg into the skin daily in the afternoon.  Patient not taking: Reported on 07/05/2020    [provider]  ondansetron (ZOFRAN ODT) 4 MG disintegrating tablet Take 1 tablet (4 mg total) by mouth every 8 (eight) hours as needed for nausea or vomiting. Patient not taking: Reported on 07/05/2020 01/06/20   Blake Divine, MD  pantoprazole (PROTONIX) 40 MG tablet Take 1 tablet (40 mg total) by mouth 2 (two) times daily. Patient not taking: Reported on 07/05/2020 03/31/20  05/01/20  Sidney Ace, MD  sucralfate (CARAFATE) 1 g tablet Take 1 tablet (1 g total) by mouth 4 (four) times daily. Patient not taking: Reported on 05/01/2020 03/31/20 04/30/20  Sidney Ace, MD  ZENPEP [phone] units CPEP Take 2-4 capsules by mouth 5 (five) times daily. [place] 4CAPSULE-DINNER Patient not taking: Reported on 07/05/2020 03/20/20   [provider]     Review of Systems  Constitutional: Positive for fatigue. Negative for appetite change.  HENT: Negative for congestion, postnasal drip and sore throat.   Eyes: Negative.   Respiratory: Positive for cough and shortness of breath. Negative for chest tightness and wheezing.   Cardiovascular: Positive for palpitations. Negative for chest pain and leg swelling.  Gastrointestinal: Positive for abdominal distention (at times) and abdominal pain (at times). Negative for diarrhea and nausea.  Endocrine: Negative.   Genitourinary: Negative.   Musculoskeletal: Positive for back pain. Negative for arthralgias.  Skin: Negative.   Allergic/Immunologic: Negative.   Neurological: Negative for  dizziness, light-headedness and headaches.  Hematological: Negative for adenopathy. Does not bruise/bleed easily.  Psychiatric/Behavioral: Negative for dysphoric mood, sleep disturbance (sleeping on 4 pillows) and suicidal ideas. The patient is not nervous/anxious.    Vitals:   07/05/20 1309  BP: 128/89  Pulse: 88  Resp: 18  SpO2: 97%  Weight: 296 lb 6 oz (134.4 kg)  Height: 5\' 6"  (1.676 m)   Wt Readings from Last 3 Encounters:  07/05/20 296 lb 6 oz (134.4 kg)  05/01/20 260 lb (117.9 kg)  04/12/20 (!) 308 lb 3.3 oz (139.8 kg)   Lab Results  Component Value Date   CREATININE 1.79 (H) 04/12/2020   CREATININE 1.84 (H) 03/31/2020   CREATININE 1.77 (H) 03/29/2020    Physical Exam Vitals and nursing note reviewed.  Constitutional:      Appearance: He is well-developed.  HENT:     Head: Normocephalic and atraumatic.  Neck:     Vascular: No JVD.  Cardiovascular:     Rate and Rhythm: Normal rate and regular rhythm.  Pulmonary:     Effort: Pulmonary effort is normal.     Breath sounds: No wheezing or rales.  Abdominal:     General: There is no distension.     Palpations: Abdomen is soft.     Tenderness: There is no abdominal tenderness.  Musculoskeletal:        General: No tenderness.     Cervical back: Normal range of motion and neck supple.  Skin:    General: Skin is warm and dry.  Neurological:     Mental Status: He is alert and oriented to person, place, and time.  Psychiatric:        Behavior: Behavior normal.        Thought Content: Thought content normal.      Assessment & Plan:  1: Chronic heart failure with reduced ejection fraction- - NYHA class II - euvolemic today - weighing daily; Reminded to call for an overnight weight gain of >2 pounds or a weekly weight gain of >5 pounds.  - weight down 10 pounds from last visit here 6 months ago - BP limits titration of medications - last saw cardiology [name]) 06/11/20 - bidil gave him migraine headache -  BNP on 03/28/20 was 264.6 - discussed possibly titrating up entresto and resuming metoprolol - AICD in place - has received both COVID vaccines  2: HTN- - BP looks good today - sees PCP [name]) at [employer] [facility]  [place] returns in a few weeks - BMP from 04/12/20 reviewed and shows sodium 130, potassium 3.9, creatinine 1.79 & GFR 52  3: Obstructive sleep apnea- - not wearing CPAP anymore due to comfort level  - sleep study scheduled for 07/11/20 - continues wearing oxygen at 3L at bedtime    4: Diabetes- - fasting glucose at home was 123 - he has stopped his insulin altogether - A1c from 02/27/20 was 8.5%  5: Tobacco use- - continues to smoke 1/2 ppd cigarettes daily - does remove himself from the oxygen when smoking - complete cessation discussed for 3 minutes with him  6: Pancreatitis- - recent stomach MRI was negative. - follows with [facility]   Patient did not bring his medications nor a list. Emphasized that he needed to bring his medication bottles to every visit with him.   Return in 2 months or sooner for any questions/problems before then.

## 2020-07-05 NOTE — Patient Instructions (Signed)
Continue weighing daily and call for an overnight weight gain of > 2 pounds or a weekly weight gain of >5 pounds. 

## 2020-07-23 ENCOUNTER — Ambulatory Visit: Payer: Medicare Other | Admitting: Podiatry

## 2020-08-06 ENCOUNTER — Encounter: Payer: Self-pay | Admitting: Podiatry

## 2020-08-06 ENCOUNTER — Ambulatory Visit (INDEPENDENT_AMBULATORY_CARE_PROVIDER_SITE_OTHER): Payer: Medicare Other | Admitting: Podiatry

## 2020-08-06 ENCOUNTER — Other Ambulatory Visit: Payer: Self-pay

## 2020-08-06 DIAGNOSIS — Z794 Long term (current) use of insulin: Secondary | ICD-10-CM

## 2020-08-06 DIAGNOSIS — E119 Type 2 diabetes mellitus without complications: Secondary | ICD-10-CM

## 2020-08-06 DIAGNOSIS — B351 Tinea unguium: Secondary | ICD-10-CM

## 2020-08-06 DIAGNOSIS — M79676 Pain in unspecified toe(s): Secondary | ICD-10-CM | POA: Diagnosis not present

## 2020-08-06 NOTE — Progress Notes (Signed)
Complaint:  Visit Type: Patient returns to my office for continued preventative foot care services. Complaint: Patient states" my nails have grown long and thick and become painful to walk and wear shoes" Patient has been diagnosed with DM with no foot complications. The patient presents for preventative foot care services. No changes to ROS  Podiatric Exam: Vascular: dorsalis pedis and posterior tibial pulses are palpable bilateral. Capillary return is immediate. Temperature gradient is WNL. Skin turgor WNL  Sensorium: Normal Semmes Weinstein monofilament test. Normal tactile sensation bilaterally. Nail Exam: Pt has thick disfigured discolored nails with subungual debris noted bilateral entire nail hallux through fifth toenails Ulcer Exam: There is no evidence of ulcer or pre-ulcerative changes or infection. Orthopedic Exam: Muscle tone and strength are WNL. No limitations in general ROM. No crepitus or effusions noted. Foot type and digits show no abnormalities. 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 Skin: No Porokeratosis. No infection or ulcers.  Heel calus  B/L.  Diagnosis:  Onychomycosis, , Pain in right toe, pain in left toes  Treatment & Plan Procedures and Treatment: Consent by patient was obtained for treatment procedures. The patient understood the discussion of treatment and procedures well. All questions were answered thoroughly reviewed. Debridement of mycotic and hypertrophic toenails, 1 through 5 bilateral and clearing of subungual debris. No ulceration, no infection noted.  Return Visit-Office Procedure: Patient instructed to return to the office for a follow up visit 3 months  for continued evaluation and treatment.    [name] DPM

## 2020-08-10 NOTE — Progress Notes (Signed)
Patient ID: Oscar [name], male    DOB: [birth year], 44 y.o.   MRN: [id number]  HPI  Oscar [name] is a 44 y/o male with a history of obstructive sleep apnea (currently with CPAP), MI, HTN, hyperlipidemia, GERD, DM, SVT, asthma and chronic heart failure.  Echo done 02/27/20 reviewed and showed an EF of <20%. Echo done 03/01/2019 which showed an EF of 15% along with mild Oscar. Echo done 06/23/18 reviewed and showed an EF of 20-25% along with mild Oscar. Echo done 02/06/15 which showed an EF of 25% which is unchanged from previous echo on 12/10/14 which showed an EF of 25% and mild Oscar.   Was in the ED 04/11/20 due to abdominal/ epigastric pain where he was treated and released.     He presents today for a follow-up visit with a chief complaint of minimal fatigue upon moderate exertion. He describes this as chronic in nature having been present for several years. He has associated cough, shortness of breath, intermittent palpitations, abdominal distention, abdominal pain and back pain. He denies any difficulty sleeping, pedal edema, chest pain, dizziness or weight gain.   Says that he's still only taking metoprolol, entresto & entresto because he didn't know which medications were causing his abdominal pain. He says that he continues to feel "very well" since he stopped everything. EF continues to remain low.   Past Medical History:  Diagnosis Date  . AICD (automatic cardioverter/defibrillator) present   . Asthma   . Cardiomyopathy ([place])   . CHF (congestive heart failure) ([place])   . Coronary artery disease   . Deafness in right ear   . Diabetes mellitus without complication ([place])   . Dilated cardiomyopathy ([place])   . Dysrhythmia    svt  . 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 GERD (gastroesophageal reflux disease)   . Hyperlipidemia   . Hypertension   . Hypoxemia   . Hypoxemia   . Mild obesity   . Myocardial infarction ([place])    [id number],1/85  . Pancreatitis   . Sleep apnea    osa    Past Surgical History:  Procedure Laterality Date  . CARDIAC CATHETERIZATION  12/11/2014   Procedure: RIGHT/LEFT HEART CATH AND CORONARY ANGIOGRAPHY;  Surgeon: Lorretta Harp, MD;  Location: [facility] CATH LAB;  Service: Cardiovascular;;  . ICD LEAD REMOVAL N/A 03/30/2015   Procedure: ICD LEAD REMOVAL;  Surgeon: Marzetta Board, MD;  Location: [facility] ORS;  Service: Cardiovascular;  Laterality: N/A;  . IMPLANTABLE CARDIOVERTER DEFIBRILLATOR IMPLANT    . INSERT / REPLACE / REMOVE PACEMAKER    . LEFT HEART CATHETERIZATION WITH CORONARY ANGIOGRAM N/A 12/09/2014   Procedure: LEFT HEART CATHETERIZATION WITH CORONARY ANGIOGRAM;  Surgeon: Burnell Blanks, MD;  Location: [facility] CATH LAB;  Service: Cardiovascular;  Laterality: N/A;   Family History  Problem Relation Age of Onset  . Hypertension Mother   . Congestive Heart Failure Mother   . Hypertension Sister   . Diabetes Sister   . Pancreatitis Sister   . COPD Sister   . Pancreatitis Brother   . Anemia Neg Hx   . Arrhythmia Neg Hx   . Asthma Neg Hx   . Clotting disorder Neg Hx   . Fainting Neg Hx   . Heart attack Neg Hx   . Heart disease Neg Hx   . Heart failure Neg Hx   . Hyperlipidemia Neg Hx    Social History   Tobacco Use  . Smoking status:  Current Every Day Smoker    Packs/day: 0.50    Years: 24.00    Pack years: 12.00    Types: Cigarettes  . Smokeless tobacco: Never Used  . Tobacco comment: 4/5 Smoking 5 cigs a day  Substance Use Topics  . Alcohol use: No   Allergies  Allergen Reactions  . Bidil [Isosorb Dinitrate-Hydralazine] Other (See Comments)    Migraine Headache Migraine Headache  . Ciprofloxacin    Prior to Admission medications   Medication Sig Start Date End Date Taking? Authorizing Provider  metoprolol succinate (TOPROL-XL) 25 MG 24 hr tablet Take 75 mg by mouth daily.   Yes [provider]  sacubitril-valsartan (ENTRESTO) 49-51 MG Take 1 tablet by mouth 2 (two) times daily.  06/13/19  Yes [provider]  torsemide (DEMADEX) 20 MG tablet Take 40 mg by mouth daily. Additional 40mg  in the afternoon PRN   Yes [provider]  albuterol (VENTOLIN HFA) 108 (90 Base) MCG/ACT inhaler 2 inhalations every 6 (six) hours as needed Patient not taking: Reported on 08/13/2020 01/21/19   [provider]  atorvastatin (LIPITOR) 40 MG tablet Take 1 tablet (40 mg total) by mouth daily at 6 PM. Patient not taking: Reported on 08/13/2020 11/04/18   Alisa Graff, FNP  clindamycin (CLEOCIN T) 1 % external solution Apply 1 application topically 2 (two) times daily.  Patient not taking: Reported on 08/13/2020 02/16/20   [provider]  clopidogrel (PLAVIX) 75 MG tablet Take 1 tablet (75 mg total) by mouth daily. Patient not taking: Reported on 08/13/2020 02/28/20   Clabe Seal, PA-C  famotidine (PEPCID) 20 MG tablet Take 1 tablet (20 mg total) by mouth 2 (two) times daily. Patient not taking: Reported on 07/05/2020 03/31/20 05/01/20  Sidney Ace, MD  gabapentin (NEURONTIN) 300 MG capsule Take 300 mg by mouth 2 (two) times daily.  Patient not taking: Reported on 08/13/2020    [provider]  insulin detemir (LEVEMIR FLEXTOUCH) 100 UNIT/ML FlexPen Inject 30 Units into the skin 2 (two) times daily.  Patient not taking: Reported on 08/13/2020 11/10/18   [provider]  lidocaine (XYLOCAINE) 2 % solution Use as directed 15 mLs in the mouth or throat every 6 (six) hours as needed (stomach pain). Patient not taking: Reported on 08/13/2020 12/29/19   Nance Pear, MD  liraglutide (VICTOZA) 18 MG/3ML SOPN Inject 18 mg into the skin daily in the afternoon.  Patient not taking: Reported on 08/13/2020    [provider]  tiotropium (SPIRIVA) 18 MCG inhalation capsule Place 18 mcg into inhaler and inhale daily. Patient not taking: Reported on 08/13/2020    [provider]  umeclidinium-vilanterol (ANORO ELLIPTA) 62.5-25 MCG/INH AEPB Inhale 1 puff  into the lungs daily in the afternoon.  Patient not taking: Reported on 08/13/2020 01/31/19   [provider]  ZENPEP 40000-126000 units CPEP Take 2-4 capsules by mouth 5 (five) times daily. Burt 4CAPSULE-DINNER Patient not taking: Reported on 08/13/2020 03/20/20   [provider]    Review of Systems  Constitutional: Positive for fatigue. Negative for appetite change.  HENT: Negative for congestion, postnasal drip and sore throat.   Eyes: Negative.   Respiratory: Positive for cough, shortness of breath and wheezing (at times). Negative for chest tightness.   Cardiovascular: Positive for palpitations. Negative for chest pain and leg swelling.  Gastrointestinal: Positive for abdominal distention (at times) and abdominal pain (at times). Negative for diarrhea and nausea.  Endocrine: Negative.  Genitourinary: Negative.   Musculoskeletal: Positive for back pain. Negative for arthralgias.  Skin: Negative.   Allergic/Immunologic: Negative.   Neurological: Negative for dizziness, light-headedness and headaches.  Hematological: Negative for adenopathy. Does not bruise/bleed easily.  Psychiatric/Behavioral: Negative for dysphoric mood, sleep disturbance (sleeping on 4 pillows) and suicidal ideas. The patient is not nervous/anxious.    Vitals:   08/13/20 1331  BP: 126/82  Pulse: 89  Resp: 20  SpO2: 97%  Weight: 299 lb 2 oz (135.7 kg)  Height: 5\' 5"  (1.651 m)   Wt Readings from Last 3 Encounters:  08/13/20 299 lb 2 oz (135.7 kg)  07/05/20 296 lb 6 oz (134.4 kg)  05/01/20 260 lb (117.9 kg)   Lab Results  Component Value Date   CREATININE 1.79 (H) 04/12/2020   CREATININE 1.84 (H) 03/31/2020   CREATININE 1.77 (H) 03/29/2020    Physical Exam Vitals and nursing note reviewed.  Constitutional:      Appearance: He is well-developed.  HENT:     Head: Normocephalic and atraumatic.  Neck:     Vascular: No JVD.   Cardiovascular:     Rate and Rhythm: Normal rate and regular rhythm.  Pulmonary:     Effort: Pulmonary effort is normal.     Breath sounds: No wheezing or rales.  Abdominal:     General: There is no distension.     Palpations: Abdomen is soft.     Tenderness: There is no abdominal tenderness.  Musculoskeletal:        General: No tenderness.     Cervical back: Normal range of motion and neck supple.  Skin:    General: Skin is warm and dry.  Neurological:     Mental Status: He is alert and oriented to person, place, and time.  Psychiatric:        Behavior: Behavior normal.        Thought Content: Thought content normal.      Assessment & Plan:  1: Chronic heart failure with reduced ejection fraction- - NYHA class II - euvolemic today - weighing daily; Reminded to call for an overnight weight gain of >2 pounds or a weekly weight gain of >5 pounds.  - weight up 3 pounds from last visit here 1 month ago - will increase entresto to 97/103mg  BID; advised that he could finish his current bottle by taking 2 tablets BID until gone and then when he gets the new dose, he'll resume taking 1 tablet BID - will check BMP at next visit - last saw cardiology [name]) 06/11/20 - bidil gave him migraine headache - BNP on 03/28/20 was 264.6 - discussed possibly resuming spironolactone and/ or adding farxiga or jardiance at next visit & rationale for this was explained.  - AICD in place - has received both COVID vaccines  2: HTN- - BP looks good today - sees PCP [name]) at [place] from 04/12/20 reviewed and shows sodium 130, potassium 3.9, creatinine 1.79 & GFR 52  3: Obstructive sleep apnea- - not wearing CPAP anymore due to comfort level  - continues wearing oxygen at 3L at bedtime & PRN during the day  4: Diabetes- - fasting glucose at home was 147 - continues off his insulin - A1c from 02/27/20 was 8.5%  5: Tobacco use- - continues to smoke  1/2 ppd cigarettes daily - does remove himself from the oxygen when smoking - complete cessation discussed for 3 minutes with him  6: Pancreatitis- -  recent stomach MRI was negative. - follows with [facility]   Patient did not bring his medications nor a list. Emphasized that he needed to bring his medication bottles to every visit with him.   Return in 1 month or sooner for any questions/problems before then.

## 2020-08-13 ENCOUNTER — Ambulatory Visit: Payer: Medicare Other | Attending: Family | Admitting: Family

## 2020-08-13 ENCOUNTER — Encounter: Payer: Self-pay | Admitting: Family

## 2020-08-13 ENCOUNTER — Other Ambulatory Visit: Payer: Self-pay

## 2020-08-13 VITALS — BP 126/82 | HR 89 | Resp 20 | Ht 65.0 in | Wt 299.1 lb

## 2020-08-13 DIAGNOSIS — I110 Hypertensive heart disease with heart failure: Secondary | ICD-10-CM | POA: Insufficient documentation

## 2020-08-13 DIAGNOSIS — Z95810 Presence of automatic (implantable) cardiac defibrillator: Secondary | ICD-10-CM | POA: Diagnosis not present

## 2020-08-13 DIAGNOSIS — Z833 Family history of diabetes mellitus: Secondary | ICD-10-CM | POA: Diagnosis not present

## 2020-08-13 DIAGNOSIS — I252 Old myocardial infarction: Secondary | ICD-10-CM | POA: Diagnosis not present

## 2020-08-13 DIAGNOSIS — R002 Palpitations: Secondary | ICD-10-CM | POA: Insufficient documentation

## 2020-08-13 DIAGNOSIS — Z8249 Family history of ischemic heart disease and other diseases of the circulatory system: Secondary | ICD-10-CM | POA: Diagnosis not present

## 2020-08-13 DIAGNOSIS — G43909 Migraine, unspecified, not intractable, without status migrainosus: Secondary | ICD-10-CM | POA: Diagnosis not present

## 2020-08-13 DIAGNOSIS — I10 Essential (primary) hypertension: Secondary | ICD-10-CM

## 2020-08-13 DIAGNOSIS — Z716 Tobacco abuse counseling: Secondary | ICD-10-CM | POA: Insufficient documentation

## 2020-08-13 DIAGNOSIS — Z79899 Other long term (current) drug therapy: Secondary | ICD-10-CM | POA: Insufficient documentation

## 2020-08-13 DIAGNOSIS — J45909 Unspecified asthma, uncomplicated: Secondary | ICD-10-CM | POA: Diagnosis not present

## 2020-08-13 DIAGNOSIS — R109 Unspecified abdominal pain: Secondary | ICD-10-CM | POA: Diagnosis not present

## 2020-08-13 DIAGNOSIS — K859 Acute pancreatitis without necrosis or infection, unspecified: Secondary | ICD-10-CM | POA: Diagnosis not present

## 2020-08-13 DIAGNOSIS — R140 Abdominal distension (gaseous): Secondary | ICD-10-CM | POA: Diagnosis not present

## 2020-08-13 DIAGNOSIS — G4733 Obstructive sleep apnea (adult) (pediatric): Secondary | ICD-10-CM | POA: Insufficient documentation

## 2020-08-13 DIAGNOSIS — E119 Type 2 diabetes mellitus without complications: Secondary | ICD-10-CM | POA: Diagnosis not present

## 2020-08-13 DIAGNOSIS — Z881 Allergy status to other antibiotic agents status: Secondary | ICD-10-CM | POA: Diagnosis not present

## 2020-08-13 DIAGNOSIS — I471 Supraventricular tachycardia: Secondary | ICD-10-CM | POA: Diagnosis not present

## 2020-08-13 DIAGNOSIS — R0602 Shortness of breath: Secondary | ICD-10-CM | POA: Insufficient documentation

## 2020-08-13 DIAGNOSIS — Z720 Tobacco use: Secondary | ICD-10-CM

## 2020-08-13 DIAGNOSIS — I2510 Atherosclerotic heart disease of native coronary artery without angina pectoris: Secondary | ICD-10-CM | POA: Diagnosis not present

## 2020-08-13 DIAGNOSIS — F17210 Nicotine dependence, cigarettes, uncomplicated: Secondary | ICD-10-CM | POA: Diagnosis not present

## 2020-08-13 DIAGNOSIS — I5022 Chronic systolic (congestive) heart failure: Secondary | ICD-10-CM | POA: Diagnosis not present

## 2020-08-13 DIAGNOSIS — E785 Hyperlipidemia, unspecified: Secondary | ICD-10-CM | POA: Diagnosis not present

## 2020-08-13 DIAGNOSIS — K219 Gastro-esophageal reflux disease without esophagitis: Secondary | ICD-10-CM | POA: Diagnosis not present

## 2020-08-13 DIAGNOSIS — K861 Other chronic pancreatitis: Secondary | ICD-10-CM

## 2020-08-13 DIAGNOSIS — E669 Obesity, unspecified: Secondary | ICD-10-CM | POA: Insufficient documentation

## 2020-08-13 DIAGNOSIS — Z794 Long term (current) use of insulin: Secondary | ICD-10-CM | POA: Diagnosis not present

## 2020-08-13 MED ORDER — SACUBITRIL-VALSARTAN 97-103 MG PO TABS: 1.0000 | ORAL_TABLET | Freq: Two times a day (BID) | ORAL | 5 refills | Status: DC

## 2020-08-13 NOTE — Patient Instructions (Addendum)
Continue weighing daily and call for an overnight weight gain of > 2 pounds or a weekly weight gain of >5 pounds.   Finish your current entresto by taking 2 tablets in the morning and 2 tablets in the evening until they are gone. When you pick up your new bottle of entresto (97/103mg ), you will then resume taking 1 tablet twice a day

## 2020-08-14 ENCOUNTER — Institutional Professional Consult (permissible substitution): Payer: Medicare Other | Admitting: Pulmonary Disease

## 2020-08-16 ENCOUNTER — Ambulatory Visit: Payer: Medicare Other | Admitting: Podiatry

## 2020-09-13 ENCOUNTER — Ambulatory Visit: Payer: Medicare Other | Admitting: Family

## 2020-09-13 ENCOUNTER — Telehealth: Payer: Self-pay | Admitting: Family

## 2020-09-13 NOTE — Progress Notes (Deleted)
Patient ID: Oscar [name], male    DOB: March 13, 1976, 45 y.o.   MRN: [id number]  HPI  Oscar [name] is a 45 y/o male with a history of obstructive sleep apnea (currently with CPAP), MI, HTN, hyperlipidemia, GERD, DM, SVT, asthma and chronic heart failure.  Echo done 02/27/20 reviewed and showed an EF of <20%. Echo done 03/01/2019 which showed an EF of 15% along with mild Oscar. Echo done 06/23/18 reviewed and showed an EF of 20-25% along with mild Oscar. Echo done 02/06/15 which showed an EF of 25% which is unchanged from previous echo on 12/10/14 which showed an EF of 25% and mild Oscar.   Was in the ED 04/11/20 due to abdominal/ epigastric pain where he was treated and released.     He presents today for a follow-up visit with a chief complaint of   Past Medical History:  Diagnosis Date  . AICD (automatic cardioverter/defibrillator) present   . Asthma   . Cardiomyopathy ([place])   . CHF (congestive heart failure) ([place])   . Coronary artery disease   . Deafness in right ear   . Diabetes mellitus without complication ([place])   . Dilated cardiomyopathy ([place])   . Dysrhythmia    svt  . 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 GERD (gastroesophageal reflux disease)   . Hyperlipidemia   . Hypertension   . Hypoxemia   . Hypoxemia   . Mild obesity   . Myocardial infarction ([place])    [id number],0/93  . Pancreatitis   . Sleep apnea    osa   Past Surgical History:  Procedure Laterality Date  . CARDIAC CATHETERIZATION  12/11/2014   Procedure: RIGHT/LEFT HEART CATH AND CORONARY ANGIOGRAPHY;  Surgeon: Oscar Harp, MD;  Location: [facility] CATH LAB;  Service: Cardiovascular;;  . ICD LEAD REMOVAL N/A 03/30/2015   Procedure: ICD LEAD REMOVAL;  Surgeon: Oscar Board, MD;  Location: [facility] ORS;  Service: Cardiovascular;  Laterality: N/A;  . IMPLANTABLE CARDIOVERTER DEFIBRILLATOR IMPLANT    . INSERT / REPLACE / REMOVE PACEMAKER    . LEFT HEART CATHETERIZATION WITH CORONARY ANGIOGRAM N/A 12/09/2014   Procedure:  LEFT HEART CATHETERIZATION WITH CORONARY ANGIOGRAM;  Surgeon: Oscar Blanks, MD;  Location: [facility] CATH LAB;  Service: Cardiovascular;  Laterality: N/A;   Family History  Problem Relation Age of Onset  . Hypertension Mother   . Congestive Heart Failure Mother   . Hypertension Sister   . Diabetes Sister   . Pancreatitis Sister   . COPD Sister   . Pancreatitis Brother   . Anemia Neg Hx   . Arrhythmia Neg Hx   . Asthma Neg Hx   . Clotting disorder Neg Hx   . Fainting Neg Hx   . Heart attack Neg Hx   . Heart disease Neg Hx   . Heart failure Neg Hx   . Hyperlipidemia Neg Hx    Social History   Tobacco Use  . Smoking status: Current Every Day Smoker    Packs/day: 0.50    Years: 24.00    Pack years: 12.00    Types: Cigarettes  . Smokeless tobacco: Never Used  . Tobacco comment: 4/5 Smoking 5 cigs a day  Substance Use Topics  . Alcohol use: No   Allergies  Allergen Reactions  . Bidil [Isosorb Dinitrate-Hydralazine] Other (See Comments)    Migraine Headache Migraine Headache  . Ciprofloxacin      Review of Systems  Constitutional: Positive for fatigue. Negative  for appetite change.  HENT: Negative for congestion, postnasal drip and sore throat.   Eyes: Negative.   Respiratory: Positive for cough, shortness of breath and wheezing (at times). Negative for chest tightness.   Cardiovascular: Positive for palpitations. Negative for chest pain and leg swelling.  Gastrointestinal: Positive for abdominal distention (at times) and abdominal pain (at times). Negative for diarrhea and nausea.  Endocrine: Negative.   Genitourinary: Negative.   Musculoskeletal: Positive for back pain. Negative for arthralgias.  Skin: Negative.   Allergic/Immunologic: Negative.   Neurological: Negative for dizziness, light-headedness and headaches.  Hematological: Negative for adenopathy. Does not bruise/bleed easily.  Psychiatric/Behavioral: Negative for dysphoric mood, sleep disturbance  (sleeping on 4 pillows) and suicidal ideas. The patient is not nervous/anxious.      Physical Exam Vitals and nursing note reviewed.  Constitutional:      Appearance: He is well-developed.  HENT:     Head: Normocephalic and atraumatic.  Neck:     Vascular: No JVD.  Cardiovascular:     Rate and Rhythm: Normal rate and regular rhythm.  Pulmonary:     Effort: Pulmonary effort is normal.     Breath sounds: No wheezing or rales.  Abdominal:     General: There is no distension.     Palpations: Abdomen is soft.     Tenderness: There is no abdominal tenderness.  Musculoskeletal:        General: No tenderness.     Cervical back: Normal range of motion and neck supple.  Skin:    General: Skin is warm and dry.  Neurological:     Mental Status: He is alert and oriented to person, place, and time.  Psychiatric:        Behavior: Behavior normal.        Thought Content: Thought content normal.      Assessment & Plan:  1: Chronic heart failure with reduced ejection fraction- - NYHA class II - euvolemic today - weighing daily; Reminded to call for an overnight weight gain of >2 pounds or a weekly weight gain of >5 pounds.  - weight 299.2 pounds from last visit here 1 month ago - entresto increased to 97/103mg  BID at last visit - will check BMP today - last saw cardiology Oscar [name]) 06/11/20 - bidil gave him migraine headache - BNP on 03/28/20 was 264.6 - discussed possibly resuming spironolactone and/ or adding farxiga or jardiance at next visit & rationale for this was explained.  - AICD in place - has received both COVID vaccines  2: HTN- - BP  - sees PCP (Oscar [name]) at [place] from 04/12/20 reviewed and shows sodium 130, potassium 3.9, creatinine 1.79 & GFR 52  3: Obstructive sleep apnea- - not wearing CPAP anymore due to comfort level  - continues wearing oxygen at 3L at bedtime & PRN during the day  4: Diabetes- - fasting glucose at  home was  - continues off his insulin - A1c from 02/27/20 was 8.5%  5: Tobacco use- - continues to smoke 1/2 ppd cigarettes daily - does remove himself from the oxygen when smoking - complete cessation discussed for 3 minutes with him  6: Pancreatitis- - recent stomach MRI was negative. - follows with [facility]   Patient did not bring his medications nor a list. Emphasized that he needed to bring his medication bottles to every visit with him.

## 2020-09-13 NOTE — Telephone Encounter (Signed)
Patient did not show for his Heart Failure Clinic appointment on 09/13/20. Will attempt to reschedule.

## 2020-10-01 ENCOUNTER — Ambulatory Visit: Payer: Medicare Other | Admitting: Family

## 2020-10-01 ENCOUNTER — Telehealth: Payer: Self-pay | Admitting: Family

## 2020-10-01 NOTE — Telephone Encounter (Signed)
Patient did not show for his Heart Failure Clinic appointment on 10/01/20. Will attempt to reschedule.  

## 2020-10-08 ENCOUNTER — Telehealth: Payer: Self-pay | Admitting: Family

## 2020-10-08 NOTE — Telephone Encounter (Signed)
Unable to reach patient in attempt to reschedule his missed/ No Show CHF Clinic appointment for the second time for Jan.   Luetta Nutting, NT

## 2020-11-13 NOTE — Progress Notes (Addendum)
Patient ID: Oscar [name], male    DOB: 1975/10/10, 46 y.o.   MRN: [id number]  HPI  Oscar [name] is a 45 y/o male with a history of obstructive sleep apnea (currently with CPAP), MI, HTN, hyperlipidemia, GERD, DM, SVT, asthma and chronic heart failure.  Echo done 02/27/20 reviewed and showed an EF of <20%. Echo done 03/01/2019 which showed an EF of 15% along with mild Oscar. Echo done 06/23/18 reviewed and showed an EF of 20-25% along with mild Oscar. Echo done 02/06/15 which showed an EF of 25% which is unchanged from previous echo on 12/10/14 which showed an EF of 25% and mild Oscar.   Has not been admitted or been in the ED in the last 6 months.    He presents today for a follow-up visit with a chief complaint of moderate fatigue with very little exertion. He describes this as chronic in nature having been present for several years although has worsened over the last 2 days. He has associated cough, shortness of breath, intermittent chest pain, palpitations, light-headedness, slight weight gain and chronic pain along with this. He denies any abdominal distention, pedal edema or wheezing.   He says that he hasn't taken his medications yet today but overall hasn't felt well over the last couple of days. Says that he hasn't been drinking much fluids lately.   Past Medical History:  Diagnosis Date  . AICD (automatic cardioverter/defibrillator) present   . Asthma   . Cardiomyopathy ([place])   . CHF (congestive heart failure) ([place])   . Coronary artery disease   . Deafness in right ear   . Diabetes mellitus without complication ([place])   . Dilated cardiomyopathy ([place])   . Dysrhythmia    svt  . 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 GERD (gastroesophageal reflux disease)   . Hyperlipidemia   . Hypertension   . Hypoxemia   . Hypoxemia   . Mild obesity   . Myocardial infarction ([place])    5093,2671,2/45  . Pancreatitis   . Sleep apnea    osa   Past Surgical History:  Procedure Laterality Date   . CARDIAC CATHETERIZATION  12/11/2014   Procedure: RIGHT/LEFT HEART CATH AND CORONARY ANGIOGRAPHY;  Surgeon: Lorretta Harp, MD;  Location: [facility] CATH LAB;  Service: Cardiovascular;;  . ICD LEAD REMOVAL N/A 03/30/2015   Procedure: ICD LEAD REMOVAL;  Surgeon: Marzetta Board, MD;  Location: [facility] ORS;  Service: Cardiovascular;  Laterality: N/A;  . IMPLANTABLE CARDIOVERTER DEFIBRILLATOR IMPLANT    . INSERT / REPLACE / REMOVE PACEMAKER    . LEFT HEART CATHETERIZATION WITH CORONARY ANGIOGRAM N/A 12/09/2014   Procedure: LEFT HEART CATHETERIZATION WITH CORONARY ANGIOGRAM;  Surgeon: Burnell Blanks, MD;  Location: [facility] C[facility] LAB;  Service: Cardiovascular;  Laterality: N/A;   Family History  Problem Relation Age of Onset  . Hypertension Mother   . Congestive Heart Failure Mother   . Hypertension Sister   . Diabetes Sister   . Pancreatitis Sister   . COPD Sister   . Pancreatitis Brother   . Anemia Neg Hx   . Arrhythmia Neg Hx   . Asthma Neg Hx   . Clotting disorder Neg Hx   . Fainting Neg Hx   . Heart attack Neg Hx   . Heart disease Neg Hx   . Heart failure Neg Hx   . Hyperlipidemia Neg Hx    Social History   Tobacco Use  . Smoking status: Current Every  Day Smoker    Packs/day: 0.50    Years: 24.00    Pack years: 12.00    Types: Cigarettes  . Smokeless tobacco: Never Used  . Tobacco comment: 4/5 Smoking 5 cigs a day  Substance Use Topics  . Alcohol use: No   Allergies  Allergen Reactions  . Bidil [Isosorb Dinitrate-Hydralazine] Other (See Comments)    Migraine Headache Migraine Headache  . Ciprofloxacin    Prior to Admission medications   Medication Sig Start Date End Date Taking? Authorizing Provider  albuterol (VENTOLIN HFA) 108 (90 Base) MCG/ACT inhaler  01/21/19  Yes [provider]  clindamycin (CLEOCIN T) 1 % external solution Apply 1 application topically 2 (two) times daily. 02/16/20  Yes [provider]  gabapentin (NEURONTIN) 300 MG capsule Take 300  mg by mouth 2 (two) times daily.   Yes [provider]  insulin detemir (LEVEMIR FLEXTOUCH) 100 UNIT/ML FlexPen Inject 30 Units into the skin 2 (two) times daily. 11/10/18  Yes [provider]  liraglutide (VICTOZA) 18 MG/3ML SOPN Inject 18 mg into the skin daily in the afternoon.   Yes [provider]  metoprolol succinate (TOPROL-XL) 25 MG 24 hr tablet Take 75 mg by mouth daily.   Yes [provider]  omeprazole (PRILOSEC) 20 MG capsule Take 20 mg by mouth daily as needed.   Yes [provider]  sacubitril-valsartan (ENTRESTO) 97-103 MG Take 1 tablet by mouth 2 (two) times daily. 08/13/20  Yes Darylene Price A, FNP  tiotropium (SPIRIVA) 18 MCG inhalation capsule Place 18 mcg into inhaler and inhale daily.   Yes [provider]  torsemide (DEMADEX) 20 MG tablet Take 40 mg by mouth daily. Additional 40mg  in the afternoon PRN   Yes [provider]  umeclidinium-vilanterol (ANORO ELLIPTA) 62.5-25 MCG/INH AEPB Inhale 1 puff into the lungs daily in the afternoon. 01/31/19  Yes [provider]  atorvastatin (LIPITOR) 40 MG tablet Take 1 tablet (40 mg total) by mouth daily at 6 PM. Patient not taking: No sig reported 11/04/18   Darylene Price A, FNP  clopidogrel (PLAVIX) 75 MG tablet Take 1 tablet (75 mg total) by mouth daily. Patient not taking: No sig reported 02/28/20   Clabe Seal, PA-C  ZENPEP 40000-126000 units CPEP Take 2-4 capsules by mouth 5 (five) times daily. [place] 4CAPSULE-DINNER Patient not taking: No sig reported 03/20/20   [provider]    Review of Systems  Constitutional: Positive for fatigue (worsening). Negative for appetite change.  HENT: Negative for congestion, postnasal drip and sore throat.   Eyes: Negative.   Respiratory: Positive for cough and shortness of breath. Negative for chest tightness and wheezing.   Cardiovascular: Positive for  chest pain and palpitations. Negative for leg swelling.  Gastrointestinal: Positive for abdominal pain (at times). Negative for abdominal distention, diarrhea and nausea.  Endocrine: Negative.   Genitourinary: Negative.   Musculoskeletal: Positive for back pain. Negative for arthralgias.  Skin: Negative.   Allergic/Immunologic: Negative.   Neurological: Positive for light-headedness. Negative for dizziness and headaches.  Hematological: Negative for adenopathy. Does not bruise/bleed easily.  Psychiatric/Behavioral: Negative for dysphoric mood, sleep disturbance (sleeping on 4 pillows) and suicidal ideas. The patient is not nervous/anxious.    Vitals:   11/14/20 0832 11/14/20 0902  BP: (!) 84/70 90/60  Pulse: 72   Resp: 18   SpO2: 94%   Weight: (!) 305 lb 4 oz (138.5 kg)   Height: 5\' 6"  (1.676 m)  Wt Readings from Last 3 Encounters:  11/14/20 (!) 305 lb 4 oz (138.5 kg)  08/13/20 299 lb 2 oz (135.7 kg)  07/05/20 296 lb 6 oz (134.4 kg)   Lab Results  Component Value Date   CREATININE 1.79 (H) 04/12/2020   CREATININE 1.84 (H) 03/31/2020   CREATININE 1.77 (H) 03/29/2020    Physical Exam Vitals and nursing note reviewed.  Constitutional:      Appearance: He is well-developed.  HENT:     Head: Normocephalic and atraumatic.  Neck:     Vascular: No JVD.  Cardiovascular:     Rate and Rhythm: Tachycardia present. Rhythm irregular.     Comments: HR ranges from 70-133's Pulmonary:     Effort: Pulmonary effort is normal.     Breath sounds: No wheezing or rales.  Abdominal:     General: There is no distension.     Palpations: Abdomen is soft.     Tenderness: There is no abdominal tenderness.  Musculoskeletal:        General: No tenderness.     Cervical back: Normal range of motion and neck supple.  Skin:    General: Skin is warm and dry.  Neurological:     Mental Status: He is alert and oriented to person, place, and time.  Psychiatric:        Behavior: Behavior normal.         Thought Content: Thought content normal.      Assessment & Plan:  1: Chronic heart failure with reduced ejection fraction- - NYHA class III - euvolemic today - weighing daily; Reminded to call for an overnight weight gain of >2 pounds or a weekly weight gain of >5 pounds.  - weight up 6 pounds from last visit here 3 months ago - last saw cardiology Oscar [name]) 06/11/20 - bidil gave him migraine headache - advised to drink 48 ounces of water daily - BNP on 03/28/20 was 264.6 - AICD in place - has received both COVID vaccines  2: HTN- - BP quite low (90/60); advised to hold entresto today and beginning tomorrow, he will take 1/2 tablet BID which will be equivalent to the 49/51mg  dose - sees PCP Oscar [name]) at [place] from 09/12/20 reviewed and shows sodium 137, potassium 4.0, creatinine 1.2 & GFR 79  3: Obstructive sleep apnea- - not wearing CPAP anymore due to comfort level but would like to resume as he thinks he felt better when he wore it - continues wearing oxygen at 3L at bedtime & PRN during the day  4: Diabetes- - fasting glucose at home was 161 - continues off his insulin - A1c from 02/27/20 was 8.5%  5: Tobacco use- - continues to smoke 1/2 ppd cigarettes daily - does remove himself from the oxygen when smoking - complete cessation discussed for 3 minutes with him  6: Chest pain/ AF- - HR very irregular today and ranged from 70's-130's - history of PAF - EKG done in the office showed AF with HR of 133 with LBBB (which has been present before) - says that he's taking metoprolol succinate 75mg  daily - called his cardiology office and appointment was made with Gladstone Pih, PA for tomorrow morning at 8:30am - photocopy of today's EKG given to patient to take with him tomorrow; explained that they will probably repeat it tomorrrow - may need holter monitor and/ or anticoagulant - emphasized that he needed to take all his  medication bottles with him  tomorrow   Patient did not bring his medications nor a list. Emphasized that he needed to bring his medication bottles to every visit with him.   Return in 1 month or sooner for any questions/problems before then.

## 2020-11-14 ENCOUNTER — Other Ambulatory Visit: Payer: Self-pay

## 2020-11-14 ENCOUNTER — Encounter: Payer: Self-pay | Admitting: Family

## 2020-11-14 ENCOUNTER — Ambulatory Visit: Payer: Medicare Other | Attending: Family | Admitting: Family

## 2020-11-14 VITALS — BP 90/60 | HR 72 | Resp 18 | Ht 66.0 in | Wt 305.2 lb

## 2020-11-14 DIAGNOSIS — F17210 Nicotine dependence, cigarettes, uncomplicated: Secondary | ICD-10-CM | POA: Insufficient documentation

## 2020-11-14 DIAGNOSIS — E119 Type 2 diabetes mellitus without complications: Secondary | ICD-10-CM | POA: Insufficient documentation

## 2020-11-14 DIAGNOSIS — I5022 Chronic systolic (congestive) heart failure: Secondary | ICD-10-CM | POA: Diagnosis present

## 2020-11-14 DIAGNOSIS — Z881 Allergy status to other antibiotic agents status: Secondary | ICD-10-CM | POA: Diagnosis not present

## 2020-11-14 DIAGNOSIS — I10 Essential (primary) hypertension: Secondary | ICD-10-CM

## 2020-11-14 DIAGNOSIS — G8929 Other chronic pain: Secondary | ICD-10-CM | POA: Insufficient documentation

## 2020-11-14 DIAGNOSIS — Z833 Family history of diabetes mellitus: Secondary | ICD-10-CM | POA: Diagnosis not present

## 2020-11-14 DIAGNOSIS — I480 Paroxysmal atrial fibrillation: Secondary | ICD-10-CM | POA: Diagnosis not present

## 2020-11-14 DIAGNOSIS — Z95810 Presence of automatic (implantable) cardiac defibrillator: Secondary | ICD-10-CM | POA: Insufficient documentation

## 2020-11-14 DIAGNOSIS — I447 Left bundle-branch block, unspecified: Secondary | ICD-10-CM | POA: Insufficient documentation

## 2020-11-14 DIAGNOSIS — R079 Chest pain, unspecified: Secondary | ICD-10-CM

## 2020-11-14 DIAGNOSIS — Z888 Allergy status to other drugs, medicaments and biological substances status: Secondary | ICD-10-CM | POA: Diagnosis not present

## 2020-11-14 DIAGNOSIS — Z79899 Other long term (current) drug therapy: Secondary | ICD-10-CM | POA: Insufficient documentation

## 2020-11-14 DIAGNOSIS — E785 Hyperlipidemia, unspecified: Secondary | ICD-10-CM | POA: Insufficient documentation

## 2020-11-14 DIAGNOSIS — Z720 Tobacco use: Secondary | ICD-10-CM

## 2020-11-14 DIAGNOSIS — Z8249 Family history of ischemic heart disease and other diseases of the circulatory system: Secondary | ICD-10-CM | POA: Diagnosis not present

## 2020-11-14 DIAGNOSIS — G4733 Obstructive sleep apnea (adult) (pediatric): Secondary | ICD-10-CM | POA: Diagnosis not present

## 2020-11-14 DIAGNOSIS — I110 Hypertensive heart disease with heart failure: Secondary | ICD-10-CM | POA: Diagnosis not present

## 2020-11-14 DIAGNOSIS — I252 Old myocardial infarction: Secondary | ICD-10-CM | POA: Diagnosis not present

## 2020-11-14 DIAGNOSIS — K219 Gastro-esophageal reflux disease without esophagitis: Secondary | ICD-10-CM | POA: Diagnosis not present

## 2020-11-14 DIAGNOSIS — Z794 Long term (current) use of insulin: Secondary | ICD-10-CM | POA: Diagnosis not present

## 2020-11-14 NOTE — Patient Instructions (Addendum)
Continue weighing daily and call for an overnight weight gain of > 2 pounds or a weekly weight gain of >5 pounds.   Do not take any entresto today. Starting tomorrow, you will take 1/2 tablet of entresto twice a day. This will equal the 49/51mg  dose.    Dr. [name] [address], [place], [place] [address] [phone] [place], [place]  11/15/20 at 830am

## 2020-11-15 ENCOUNTER — Other Ambulatory Visit
Admission: RE | Admit: 2020-11-15 | Discharge: 2020-11-15 | Disposition: A | Discharge status: Home / Self Care | Payer: Medicare Other | Source: Ambulatory Visit | Attending: Student | Admitting: Student

## 2020-11-15 DIAGNOSIS — I5020 Unspecified systolic (congestive) heart failure: Secondary | ICD-10-CM | POA: Diagnosis present

## 2020-11-15 DIAGNOSIS — I471 Supraventricular tachycardia: Secondary | ICD-10-CM | POA: Diagnosis present

## 2020-11-15 DIAGNOSIS — I4891 Unspecified atrial fibrillation: Secondary | ICD-10-CM | POA: Diagnosis present

## 2020-11-15 LAB — BRAIN NATRIURETIC PEPTIDE: B Natriuretic Peptide: 805.7 pg/mL — ABNORMAL HIGH (ref 0.0–100.0)

## 2020-11-23 ENCOUNTER — Emergency Department: Payer: Medicare Other

## 2020-11-23 ENCOUNTER — Observation Stay
Admit: 2020-11-23 | Discharge: 2020-11-23 | Disposition: A | Discharge status: Home / Self Care | Payer: Medicare Other | Attending: Internal Medicine | Admitting: Internal Medicine

## 2020-11-23 ENCOUNTER — Inpatient Hospital Stay
Admission: EM | Admit: 2020-11-23 | Discharge: 2020-11-25 | DRG: 291 | Disposition: A | Discharge status: Home Health | Payer: Medicare Other | Attending: Internal Medicine | Admitting: Internal Medicine

## 2020-11-23 ENCOUNTER — Other Ambulatory Visit: Payer: Self-pay

## 2020-11-23 DIAGNOSIS — Z833 Family history of diabetes mellitus: Secondary | ICD-10-CM

## 2020-11-23 DIAGNOSIS — I5022 Chronic systolic (congestive) heart failure: Secondary | ICD-10-CM

## 2020-11-23 DIAGNOSIS — Z881 Allergy status to other antibiotic agents status: Secondary | ICD-10-CM

## 2020-11-23 DIAGNOSIS — I509 Heart failure, unspecified: Secondary | ICD-10-CM

## 2020-11-23 DIAGNOSIS — Z6841 Body Mass Index (BMI) 40.0 and over, adult: Secondary | ICD-10-CM

## 2020-11-23 DIAGNOSIS — K7460 Unspecified cirrhosis of liver: Secondary | ICD-10-CM | POA: Diagnosis present

## 2020-11-23 DIAGNOSIS — I5033 Acute on chronic diastolic (congestive) heart failure: Secondary | ICD-10-CM | POA: Diagnosis present

## 2020-11-23 DIAGNOSIS — I5043 Acute on chronic combined systolic (congestive) and diastolic (congestive) heart failure: Secondary | ICD-10-CM | POA: Diagnosis present

## 2020-11-23 DIAGNOSIS — Z8249 Family history of ischemic heart disease and other diseases of the circulatory system: Secondary | ICD-10-CM

## 2020-11-23 DIAGNOSIS — E1149 Type 2 diabetes mellitus with other diabetic neurological complication: Secondary | ICD-10-CM

## 2020-11-23 DIAGNOSIS — Z825 Family history of asthma and other chronic lower respiratory diseases: Secondary | ICD-10-CM

## 2020-11-23 DIAGNOSIS — I110 Hypertensive heart disease with heart failure: Secondary | ICD-10-CM | POA: Diagnosis not present

## 2020-11-23 DIAGNOSIS — G4733 Obstructive sleep apnea (adult) (pediatric): Secondary | ICD-10-CM

## 2020-11-23 DIAGNOSIS — I4891 Unspecified atrial fibrillation: Secondary | ICD-10-CM | POA: Diagnosis not present

## 2020-11-23 DIAGNOSIS — I5041 Acute combined systolic (congestive) and diastolic (congestive) heart failure: Secondary | ICD-10-CM | POA: Diagnosis not present

## 2020-11-23 DIAGNOSIS — Z79899 Other long term (current) drug therapy: Secondary | ICD-10-CM

## 2020-11-23 DIAGNOSIS — Z950 Presence of cardiac pacemaker: Secondary | ICD-10-CM | POA: Diagnosis not present

## 2020-11-23 DIAGNOSIS — D696 Thrombocytopenia, unspecified: Secondary | ICD-10-CM | POA: Diagnosis present

## 2020-11-23 DIAGNOSIS — Z20822 Contact with and (suspected) exposure to covid-19: Secondary | ICD-10-CM | POA: Diagnosis present

## 2020-11-23 DIAGNOSIS — J9611 Chronic respiratory failure with hypoxia: Secondary | ICD-10-CM | POA: Diagnosis present

## 2020-11-23 DIAGNOSIS — E119 Type 2 diabetes mellitus without complications: Secondary | ICD-10-CM | POA: Diagnosis present

## 2020-11-23 DIAGNOSIS — R778 Other specified abnormalities of plasma proteins: Secondary | ICD-10-CM

## 2020-11-23 DIAGNOSIS — F17210 Nicotine dependence, cigarettes, uncomplicated: Secondary | ICD-10-CM | POA: Diagnosis present

## 2020-11-23 DIAGNOSIS — E785 Hyperlipidemia, unspecified: Secondary | ICD-10-CM | POA: Diagnosis present

## 2020-11-23 DIAGNOSIS — I420 Dilated cardiomyopathy: Secondary | ICD-10-CM | POA: Diagnosis present

## 2020-11-23 DIAGNOSIS — I252 Old myocardial infarction: Secondary | ICD-10-CM

## 2020-11-23 DIAGNOSIS — H9191 Unspecified hearing loss, right ear: Secondary | ICD-10-CM | POA: Diagnosis present

## 2020-11-23 DIAGNOSIS — Z7984 Long term (current) use of oral hypoglycemic drugs: Secondary | ICD-10-CM

## 2020-11-23 DIAGNOSIS — I5023 Acute on chronic systolic (congestive) heart failure: Secondary | ICD-10-CM

## 2020-11-23 DIAGNOSIS — K219 Gastro-esophageal reflux disease without esophagitis: Secondary | ICD-10-CM | POA: Diagnosis present

## 2020-11-23 DIAGNOSIS — J449 Chronic obstructive pulmonary disease, unspecified: Secondary | ICD-10-CM | POA: Diagnosis present

## 2020-11-23 DIAGNOSIS — I2510 Atherosclerotic heart disease of native coronary artery without angina pectoris: Secondary | ICD-10-CM | POA: Diagnosis present

## 2020-11-23 DIAGNOSIS — I480 Paroxysmal atrial fibrillation: Secondary | ICD-10-CM | POA: Diagnosis present

## 2020-11-23 DIAGNOSIS — R0600 Dyspnea, unspecified: Secondary | ICD-10-CM

## 2020-11-23 DIAGNOSIS — R0602 Shortness of breath: Secondary | ICD-10-CM | POA: Diagnosis not present

## 2020-11-23 DIAGNOSIS — E662 Morbid (severe) obesity with alveolar hypoventilation: Secondary | ICD-10-CM | POA: Diagnosis present

## 2020-11-23 DIAGNOSIS — Z95810 Presence of automatic (implantable) cardiac defibrillator: Secondary | ICD-10-CM

## 2020-11-23 DIAGNOSIS — Z888 Allergy status to other drugs, medicaments and biological substances status: Secondary | ICD-10-CM

## 2020-11-23 DIAGNOSIS — N179 Acute kidney failure, unspecified: Secondary | ICD-10-CM | POA: Diagnosis present

## 2020-11-23 DIAGNOSIS — Z7902 Long term (current) use of antithrombotics/antiplatelets: Secondary | ICD-10-CM

## 2020-11-23 LAB — HEPATIC FUNCTION PANEL
ALT: 45 U/L — ABNORMAL HIGH (ref 0–44)
AST: 37 U/L (ref 15–41)
Albumin: 3.5 g/dL (ref 3.5–5.0)
Alkaline Phosphatase: 253 U/L — ABNORMAL HIGH (ref 38–126)
Bilirubin, Direct: 0.3 mg/dL — ABNORMAL HIGH (ref 0.0–0.2)
Indirect Bilirubin: 0.7 mg/dL (ref 0.3–0.9)
Total Bilirubin: 1 mg/dL (ref 0.3–1.2)
Total Protein: 7.7 g/dL (ref 6.5–8.1)

## 2020-11-23 LAB — ECHOCARDIOGRAM COMPLETE
AR max vel: 3.46 cm2
AV Area VTI: 3.12 cm2
AV Area mean vel: 3.69 cm2
AV Mean grad: 4 mmHg
AV Peak grad: 8 mmHg
Ao pk vel: 1.41 m/s
Area-P 1/2: 7.47 cm2
Calc EF: 13.3 %
Height: 66 in
MV VTI: 4.49 cm2
S' Lateral: 5.9 cm
Single Plane A2C EF: 19 %
Single Plane A4C EF: 2.6 %
Weight: 4938.3 oz

## 2020-11-23 LAB — TROPONIN I (HIGH SENSITIVITY)
Troponin I (High Sensitivity): 73 ng/L — ABNORMAL HIGH (ref <18)
Troponin I (High Sensitivity): 63 ng/L — ABNORMAL HIGH (ref <18)

## 2020-11-23 LAB — BASIC METABOLIC PANEL
Anion gap: 9 (ref 5–15)
BUN: 41 mg/dL — ABNORMAL HIGH (ref 6–20)
CO2: 29 mmol/L (ref 22–32)
Calcium: 8.7 mg/dL — ABNORMAL LOW (ref 8.9–10.3)
Chloride: 96 mmol/L — ABNORMAL LOW (ref 98–111)
Creatinine, Ser: 1.97 mg/dL — ABNORMAL HIGH (ref 0.61–1.24)
GFR, Estimated: 42 mL/min — ABNORMAL LOW (ref >60)
Glucose, Bld: 218 mg/dL — ABNORMAL HIGH (ref 70–99)
Potassium: 3.8 mmol/L (ref 3.5–5.1)
Sodium: 134 mmol/L — ABNORMAL LOW (ref 135–145)

## 2020-11-23 LAB — RESP PANEL BY RT-PCR (FLU A&B, COVID) ARPGX2
Influenza A by PCR: NEGATIVE
Influenza B by PCR: NEGATIVE
SARS Coronavirus 2 by RT PCR: NEGATIVE

## 2020-11-23 LAB — CBC
HCT: 41.6 % (ref 39.0–52.0)
Hemoglobin: 14.2 g/dL (ref 13.0–17.0)
MCH: 33 pg (ref 26.0–34.0)
MCHC: 34.1 g/dL (ref 30.0–36.0)
MCV: 96.7 fL (ref 80.0–100.0)
Platelets: 141 10*3/uL — ABNORMAL LOW (ref 150–400)
RBC: 4.3 MIL/uL (ref 4.22–5.81)
RDW: 13.1 % (ref 11.5–15.5)
WBC: 5.7 10*3/uL (ref 4.0–10.5)
nRBC: 0 % (ref 0.0–0.2)

## 2020-11-23 LAB — BRAIN NATRIURETIC PEPTIDE: B Natriuretic Peptide: 1173.5 pg/mL — ABNORMAL HIGH (ref 0.0–100.0)

## 2020-11-23 LAB — LIPASE, BLOOD: Lipase: 47 U/L (ref 11–51)

## 2020-11-23 LAB — MAGNESIUM: Magnesium: 1.4 mg/dL — ABNORMAL LOW (ref 1.7–2.4)

## 2020-11-23 MED ORDER — MAGNESIUM SULFATE 2 GM/50ML IV SOLN
2.0000 g | INTRAVENOUS | Status: AC
  Administered 2020-11-23: 2 g via INTRAVENOUS
  Filled 2020-11-23: qty 50

## 2020-11-23 MED ORDER — TIOTROPIUM BROMIDE MONOHYDRATE 18 MCG IN CAPS
18.0000 ug | ORAL_CAPSULE | Freq: Every day | RESPIRATORY_TRACT | Status: DC
  Administered 2020-11-23 – 2020-11-25 (×3): 18 ug via RESPIRATORY_TRACT
  Filled 2020-11-23: qty 5

## 2020-11-23 MED ORDER — SODIUM CHLORIDE 0.9% FLUSH
3.0000 mL | Freq: Two times a day (BID) | INTRAVENOUS | Status: DC
  Administered 2020-11-23 – 2020-11-25 (×4): 3 mL via INTRAVENOUS

## 2020-11-23 MED ORDER — SACUBITRIL-VALSARTAN 49-51 MG PO TABS
1.0000 | ORAL_TABLET | Freq: Two times a day (BID) | ORAL | Status: DC
  Administered 2020-11-24: 1 via ORAL
  Filled 2020-11-23: qty 1

## 2020-11-23 MED ORDER — PERFLUTREN LIPID MICROSPHERE
1.0000 mL | INTRAVENOUS | Status: AC | PRN
  Administered 2020-11-23: 2 mL via INTRAVENOUS
  Filled 2020-11-23: qty 10

## 2020-11-23 MED ORDER — SODIUM CHLORIDE 0.9% FLUSH: 3.0000 mL | INTRAVENOUS | Status: DC | PRN

## 2020-11-23 MED ORDER — FUROSEMIDE 10 MG/ML IJ SOLN
80.0000 mg | Freq: Once | INTRAMUSCULAR | Status: AC
  Administered 2020-11-23: 80 mg via INTRAVENOUS
  Filled 2020-11-23: qty 8

## 2020-11-23 MED ORDER — UMECLIDINIUM-VILANTEROL 62.5-25 MCG/INH IN AEPB
1.0000 | INHALATION_SPRAY | Freq: Every day | RESPIRATORY_TRACT | Status: DC
  Administered 2020-11-23 – 2020-11-24 (×2): 1 via RESPIRATORY_TRACT
  Filled 2020-11-23: qty 14

## 2020-11-23 MED ORDER — ONDANSETRON HCL 4 MG/2ML IJ SOLN: 4.0000 mg | Freq: Four times a day (QID) | INTRAMUSCULAR | Status: DC | PRN

## 2020-11-23 MED ORDER — SODIUM CHLORIDE 0.9 % IV SOLN: 250.0000 mL | INTRAVENOUS | Status: DC | PRN

## 2020-11-23 MED ORDER — SODIUM CHLORIDE 0.9 % IV BOLUS
500.0000 mL | Freq: Once | INTRAVENOUS | Status: AC
  Administered 2020-11-23: 500 mL via INTRAVENOUS

## 2020-11-23 MED ORDER — ACETAMINOPHEN 325 MG PO TABS
325.0000 mg | ORAL_TABLET | ORAL | Status: DC | PRN
  Administered 2020-11-24: 325 mg via ORAL
  Filled 2020-11-23: qty 1

## 2020-11-23 MED ORDER — IOHEXOL 350 MG/ML SOLN
100.0000 mL | Freq: Once | INTRAVENOUS | Status: AC | PRN
  Administered 2020-11-23: 100 mL via INTRAVENOUS

## 2020-11-23 MED ORDER — RIVAROXABAN 20 MG PO TABS
20.0000 mg | ORAL_TABLET | Freq: Every day | ORAL | Status: DC
  Administered 2020-11-23 – 2020-11-24 (×2): 20 mg via ORAL
  Filled 2020-11-23 (×4): qty 1

## 2020-11-23 MED ORDER — AMIODARONE HCL 200 MG PO TABS
200.0000 mg | ORAL_TABLET | Freq: Two times a day (BID) | ORAL | Status: DC
  Administered 2020-11-24 – 2020-11-25 (×3): 200 mg via ORAL
  Filled 2020-11-23 (×4): qty 1

## 2020-11-23 MED ORDER — MAGNESIUM SULFATE IN D5W 1-5 GM/100ML-% IV SOLN
1.0000 g | Freq: Once | INTRAVENOUS | Status: DC
  Filled 2020-11-23: qty 100

## 2020-11-23 MED ORDER — ATORVASTATIN CALCIUM 20 MG PO TABS
40.0000 mg | ORAL_TABLET | Freq: Every day | ORAL | Status: DC
  Administered 2020-11-23 – 2020-11-24 (×2): 40 mg via ORAL
  Filled 2020-11-23 (×2): qty 2

## 2020-11-23 MED ORDER — NITROGLYCERIN 2 % TD OINT
1.0000 [in_us] | TOPICAL_OINTMENT | Freq: Once | TRANSDERMAL | Status: AC
  Administered 2020-11-23: 1 [in_us] via TOPICAL
  Filled 2020-11-23: qty 1

## 2020-11-23 MED ORDER — DILTIAZEM HCL-DEXTROSE 125-5 MG/125ML-% IV SOLN (PREMIX)
5.0000 mg/h | INTRAVENOUS | Status: DC
  Administered 2020-11-23 – 2020-11-24 (×2): 5 mg/h via INTRAVENOUS
  Filled 2020-11-23 (×2): qty 125

## 2020-11-23 MED ORDER — PANTOPRAZOLE SODIUM 40 MG PO TBEC
80.0000 mg | DELAYED_RELEASE_TABLET | Freq: Every day | ORAL | Status: DC
  Administered 2020-11-23 – 2020-11-25 (×3): 80 mg via ORAL
  Filled 2020-11-23 (×3): qty 2

## 2020-11-23 MED ORDER — FUROSEMIDE 10 MG/ML IJ SOLN
60.0000 mg | Freq: Two times a day (BID) | INTRAMUSCULAR | Status: DC
  Administered 2020-11-24: 60 mg via INTRAVENOUS
  Filled 2020-11-23: qty 6

## 2020-11-23 MED ORDER — EMPAGLIFLOZIN 10 MG PO TABS
10.0000 mg | ORAL_TABLET | Freq: Every day | ORAL | Status: DC
  Administered 2020-11-24 – 2020-11-25 (×2): 10 mg via ORAL
  Filled 2020-11-23 (×3): qty 1

## 2020-11-23 NOTE — ED Triage Notes (Signed)
First Nurse note:  Arrives with C/O chest pain and SOB x 4 days.  States sen by cardiology and heart rate was irregular and BP low.  STarted on Blood thinners Alen Blew)

## 2020-11-23 NOTE — H&P (Signed)
History and Physical   Oscar [name] TKP:[id number] DOB: 12-09-1975 DOA: 11/23/2020  PCP: [facility] [place]  Outpatient Specialists: [facility], Dr. [name] Patient coming from: home   I have personally briefly reviewed patient's old medical records in [place].  Chief Concern: Shortness of breath  HPI: Oscar [name] is a 45 y.o. male with medical history significant for heart failure with reduced ejection fraction, obesity, hypertension, status post dual-lead ICD, new onset atrial fibrillation, presents to the emergency department for chief concerns of worsening shortness of breath.  He reports that his shortness of breath has been worsening in the last 4 to 5 days and is worse with exertion.  He reports that he is having difficulty laying flat at night.  He endorses increase PO intake with juice and water per day. He reports he used to drink a lot of soda. He reports that now, he drinks about 5 large cups (twice the size of styrofoam about 12 oz) of water and about 5 large cups of juice per day (fruit punch).   He reports he doubled his fluid medications and reports no increase in urine output. He states that in the last 4 days, he gained about 5 pounds.   Social history: smokes 3 cigarettes per day, at his peak he smoked 0.5 ppd and started at age 40. He denies etoh and recreational drug use. He is disabled and formerly worked as a Training and development officer.  Vaccinations: He is fully vaccinated with Moderna for COVID 19, 3 doses.   On physical exam, no bilateral lower extremity pitting edema, generalized crackles and decreased breath sounds bilaterally.  Mild increase use of the sensory respiratory muscle.  ROS: Constitutional: + weight change, no fever ENT/Mouth: no sore throat, no rhinorrhea Eyes: no eye pain, no vision changes Cardiovascular: no chest pain, + dyspnea,  no edema, no palpitations Respiratory: no cough, no sputum, no wheezing Gastrointestinal: no  nausea, no vomiting, no diarrhea, no constipation Genitourinary: no urinary incontinence, no dysuria, no hematuria Musculoskeletal: no arthralgias, no myalgias Skin: no skin lesions, no pruritus, Neuro: + weakness, no loss of consciousness, no syncope Psych: no anxiety, no depression, + decrease appetite Heme/Lymph: no bruising, no bleeding  ED Course: Discussed with emergency medicine provider, patient requiring hospitalization due to heart failure exacerbation.  Vitals in the emergency department was remarkable for temperature of 98.2, respiration rate of 21, heart rate of 101, blood pressure initially was 124/86 and is now 94/71.  Labs in the emergency department was remarkable for serum sodium 134, potassium 3.8, chloride 96, bicarb 29, BUN 41, serum creatinine of 1.97, nonfasting blood glucose of 218.  WBC 5.3, hemoglobin 14.2, platelets 141.  Covid was negative.  Magnesium was 1.4.  Troponin was initially 73 and improved to 63.  BNP elevated at 1173.  He was given 500 mL bolus of normal saline, started on diltiazem drip, 1 dose of nitroglycerin ointment, 2 g of magnesium sulfate per ED provider.  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 liquid intake -I  advised patient's limitation to 1.5 L to 2 L of fluid (including juice, coffee, tea, water, soup) daily, he endorses compliance and understanding to me verbally -Furosemide 80 mg IV once -Furosemide 60 mg IV twice daily ordered for 11/24/2020, 2 doses ordered -Strict I's and O's -Complete echo ordered -Resumed home Entresto 49-51 mg twice daily for 11/24/2020 -Resumed Jardiance 10 mg daily -Primary hospitalist to consider consulting cardiology pending echo results -Discussed with nursing that if patient's shortness of breath does not improve, plan is to order an additional dose of IV Lasix versus continuous BiPAP  Acute kidney injury-query cardiorenal -Delene Loll has been resumed for 11/24/2020 due to AKI  Atrial fibrillation with RVR-Suspect secondary heart failure exacerbation -Recent diagnosis of atrial fibrillation and started on amiodarone and Entresto by cardiologist -Resumed amiodarone 200 mg twice daily, Xarelto 20 mg daily -Checking TSH -Low clinical suspicion for infectious etiology for A. fib with RVR -Continue Cardizem drip with goal heart rate of 85-1 05 -Admit to progressive cardiac with telemetry  Hypomagnesemia-replace, magnesium in the a.m.  Obstructive sleep apnea-CPAP at home is pending -BiPAP nightly ordered -Extensive discussion with the patient's regarding compliance with his CPAP, BiPAP at this time -Extensive discussion regarding compliance with CPAP at home once he does receive the device  Long QT-status post magnesium -Avoid QT prolonging medications -Repeat EKG scheduled for 11/24/20 a.m.  GERD-PPI  Debility and weakness-lives at home by himself, TOC, PT, OT ordered for possible SNF placement  As needed medications: Ondansetron 4 mg IV every 6 hours as needed for nausea and vomiting, acetaminophen  Chart reviewed.   DVT prophylaxis: Xarelto Code Status: Full code Diet: Heart healthy/carb modified Family Communication: No Disposition Plan: Pending  clinical course Consults called: None at this time Admission status: Observation, progressive cardiac, with telemetry  Past Medical History:  Diagnosis Date  . AICD (automatic cardioverter/defibrillator) present   . Asthma   . Cardiomyopathy ([place])   . CHF (congestive heart failure) ([place])   . Coronary artery disease   . Deafness in right ear   . Diabetes mellitus without complication ([place])   . Dilated cardiomyopathy ([place])   . Dysrhythmia    svt  . 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 GERD (gastroesophageal reflux disease)   . Hyperlipidemia   . Hypertension   . Hypoxemia   . Hypoxemia   . Mild obesity   . Myocardial infarction ([place])    [id number],8/29  . Pancreatitis   . Sleep apnea    osa    Past Surgical History:  Procedure Laterality Date  . CARDIAC CATHETERIZATION  12/11/2014   Procedure: RIGHT/LEFT HEART CATH AND CORONARY ANGIOGRAPHY;  Surgeon: Lorretta Harp, MD;  Location: [facility] CATH LAB;  Service: Cardiovascular;;  . ICD LEAD REMOVAL N/A 03/30/2015   Procedure: ICD LEAD REMOVAL;  Surgeon: Marzetta Board, MD;  Location: [facility] ORS;  Service: Cardiovascular;  Laterality: N/A;  . IMPLANTABLE CARDIOVERTER DEFIBRILLATOR IMPLANT    . INSERT / REPLACE / REMOVE PACEMAKER    . LEFT HEART CATHETERIZATION WITH CORONARY ANGIOGRAM N/A 12/09/2014   Procedure: LEFT HEART CATHETERIZATION WITH CORONARY ANGIOGRAM;  Surgeon: Burnell Blanks, MD;  Location[facility] CATH LAB;  Service: Cardiovascular;  Laterality: N/A;   Social History:  reports that he has been smoking cigarettes. He  has a 12.00 pack-year smoking history. He has never used smokeless tobacco. He reports that he does not drink alcohol and does not use drugs.  Allergies  Allergen Reactions  . Bidil [Isosorb Dinitrate-Hydralazine] Other (See Comments)    Migraine Headache Migraine Headache  . Ciprofloxacin Other (See Comments)    Migraine Headache   Family History  Problem Relation Age of Onset  . Hypertension  Mother   . Congestive Heart Failure Mother   . Hypertension Sister   . Diabetes Sister   . Pancreatitis Sister   . COPD Sister   . Pancreatitis Brother   . Anemia Neg Hx   . Arrhythmia Neg Hx   . Asthma Neg Hx   . Clotting disorder Neg Hx   . Fainting Neg Hx   . Heart attack Neg Hx   . Heart disease Neg Hx   . Heart failure Neg Hx   . Hyperlipidemia Neg Hx    Family history: Family history reviewed and not pertinent  Prior to Admission medications   Medication Sig Start Date End Date Taking? Authorizing Provider  amiodarone (PACERONE) 200 MG tablet Take 200 mg by mouth 2 (two) times daily. 11/15/20 11/29/20 Yes [provider]  atorvastatin (LIPITOR) 40 MG tablet Take 1 tablet (40 mg total) by mouth daily at 6 PM. Patient taking differently: Take 40 mg by mouth at bedtime. 11/04/18  Yes Hackney, Tina A, FNP  empagliflozin (JARDIANCE) 10 MG TABS tablet Take by mouth daily.   Yes [provider]  ENTRESTO 49-51 MG Take 1 tablet by mouth 2 (two) times daily. 11/09/20  Yes [provider]  omeprazole (PRILOSEC) 40 MG capsule Take 40 mg by mouth daily.   Yes [provider]  rivaroxaban (XARELTO) 20 MG TABS tablet Take 20 mg by mouth daily with supper.   Yes [provider]  tiotropium (SPIRIVA) 18 MCG inhalation capsule Place 18 mcg into inhaler and inhale daily.   Yes [provider]  torsemide (DEMADEX) 20 MG tablet Take 40 mg by mouth daily.   Yes [provider]  umeclidinium-vilanterol (ANORO ELLIPTA) 62.5-25 MCG/INH AEPB Inhale 1 puff into the lungs daily in the afternoon. 01/31/19  Yes [provider]  clopidogrel (PLAVIX) 75 MG tablet Take 1 tablet (75 mg total) by mouth daily. Patient not taking: No sig reported 02/28/20   Clabe Seal, PA-C  sacubitril-valsartan (ENTRESTO) 97-103 MG Take 1 tablet by mouth 2 (two) times daily. Patient not taking: No sig reported 08/13/20   Alisa Graff, FNP   Physical  Exam: Vitals:   11/23/20 1120 11/23/20 1130 11/23/20 1230 11/23/20 1409  BP: 131/83 110/89 111/74 99/67  Pulse: (!) 109 79 75 68  Resp: (!) 27  (!) 23 20  Temp:    (!) 97.5 F (36.4 C)  TempSrc:    Oral  SpO2: 96% 92% 94% 99%  Weight:      Height:       Constitutional: appears older than chronological age, NAD, calm, comfortable Eyes: PERRL,conjunctivae normal, bilateral drooping eyelids-Per patient this is baseline ENMT: Mucous membranes are moist. Posterior pharynx clear of any exudate or lesions. Age-appropriate dentition. Hearing appropriate Neck: normal, supple, no masses, no thyromegaly Respiratory: clear to auscultation bilaterally, no wheezing, no crackles. Normal respiratory effort. No accessory muscle use.  Cardiovascular: Regular rate and rhythm, no murmurs / rubs / gallops. No extremity edema. 2+ pedal pulses. No carotid bruits.  Abdomen:  morbidly obese abdomenno tenderness, no masses palpated, no hepatosplenomegaly.  Bowel sounds positive.  Musculoskeletal: no clubbing / cyanosis. No joint deformity upper and lower extremities. Good ROM, no contractures, no atrophy. Normal muscle tone.  Skin: no rashes, lesions, ulcers. No induration Neurologic: Sensation intact. Strength 5/5 in all 4.  Psychiatric: Normal judgment and insight. Alert and oriented x 3. Normal mood.   EKG: independently reviewed, showing atrial fibrillation with rate of 122, with RVR, QTc 581  Chest x-ray on Admission: I personally reviewed and I agree with radiologist reading as below.  DG Chest 2 View  Result Date: 11/23/2020 CLINICAL DATA:  Shortness of breath EXAM: CHEST - 2 VIEW COMPARISON:  03/28/2020 FINDINGS: Cardiomegaly. New interstitial prominence diffusely. Dual-chamber ICD/pacer leads in unchanged position. No effusion or pneumothorax seen. IMPRESSION: CHF pattern. Electronically Signed   By: Monte Fantasia M.D.   On: 11/23/2020 08:45   CT Angio Chest/Abd/Pel for Dissection W and/or Wo  Contrast  Addendum Date: 11/23/2020   ADDENDUM REPORT: 11/23/2020 09:54 ADDENDUM: Add to addendum of chest CT angiogram: Enlargement of the main pulmonary outflow tract, a finding indicative of pulmonary arterial hypertension. Electronically Signed   By: Lowella Grip III M.D.   On: 11/23/2020 09:54   Result Date: 11/23/2020 CLINICAL DATA:  Chest and abdominal pain radiating toward back. Shortness of breath EXAM: CT ANGIOGRAPHY CHEST, ABDOMEN AND PELVIS TECHNIQUE: Non-contrast CT of the chest was initially obtained. Multidetector CT imaging through the chest, abdomen and pelvis was performed using the standard protocol during bolus administration of intravenous contrast. Multiplanar reconstructed images and MIPs were obtained and reviewed to evaluate the vascular anatomy. CONTRAST:  169mL OMNIPAQUE IOHEXOL 350 MG/ML SOLN COMPARISON:  Chest radiograph November 23, 2020; CT abdomen and pelvis March 28, 2020. Chest radiograph May 28, 2017 FINDINGS: CTA CHEST FINDINGS Cardiovascular: No evident intramural hematoma in the thoracic aorta on noncontrast enhanced study. There is no evident mediastinal hematoma. There is no appreciable thoracic aortic aneurysm or dissection. Visualized great vessels appear unremarkable. Note that the right innominate and left common carotid arteries arise as a common trunk, an anatomic variant. There is no appreciable aneurysm or dissection involving the visualized great vessels. No appreciable atherosclerotic plaque noted in the visualized great vessels. Rather minimal atherosclerotic calcification noted in the aorta. No evidence of aortic ulceration in the thoracic region. No appreciable pulmonary embolus. Main pulmonary outflow tract measures 4.0 cm, enlarged. Heart is enlarged with pacemaker leads attached to the right atrium and right ventricle. No pericardial effusion or pericardial thickening. Mediastinum/Nodes: Visualized thyroid appears unremarkable. There is lymph node  enlargement slightly to the right of the lower trachea measuring 1.5 x 1.5 cm. A lymph node in the aortopulmonary window region measures 1.2 x 1.1 cm. There are scattered subcentimeter mediastinal lymph nodes as well. No esophageal lesions are evident. Lungs/Pleura: Scattered bullae are noted in the apices as well as throughout portions of the right lower lobe. There is no edema or consolidation. No pleural effusions. There are areas of mosaic attenuation in the lower lobe regions bilaterally. No pneumothorax. Trachea and major bronchial structures appear normal. Musculoskeletal: No acute appearing fracture or dislocation. There is mild anterior wedging of the T8, T9, and T10 vertebral bodies, stable. No blastic or lytic bone lesions. No evident chest wall lesions. Review of the MIP images confirms the above findings. CTA ABDOMEN AND PELVIS FINDINGS VASCULAR Aorta: There is no abdominal aortic aneurysm or dissection. There is modest plaque in the distal aorta, not causing hemodynamically significant obstruction. Celiac: Celiac artery and its branches are widely  patent. No aneurysm or dissection involving these vessels. SMA: Superior mesenteric artery and its branches are widely patent. No aneurysm or dissection involving these vessels. Renals: There are 2 renal arteries on each side. There is no appreciable aneurysm or dissection involving renal arteries or respective branches. No fibromuscular dysplasia. No appreciable atherosclerotic irregularity in these vessels. IMA: Inferior mesenteric artery and its branches are widely patent. No aneurysm or dissection involving these vessels. Inflow: Minimal atherosclerotic calcification is noted at the origins of each common iliac artery. There is also calcification in the proximal left internal iliac artery. Other major arterial pelvic vessels are widely patent. No aneurysm or dissection involving pelvic arterial vessels noted. Visualized profunda femoral and superficial  femoral arteries are patent without aneurysm or dissection involving these vessels. Veins: No obvious venous abnormality within the limitations of this arterial phase study. Review of the MIP images confirms the above findings. NON-VASCULAR Hepatobiliary: Liver measures 20.9 cm in length. Subtle irregularity along the liver capsule again noted. No focal liver lesions are appreciable. There is questionable thickening of the gallbladder wall. No evident biliary duct dilatation. Pancreas: No evident pancreatic mass or inflammatory focus. Spleen: No splenic lesions are appreciable. Adrenals/Urinary Tract: Adrenals bilaterally appear normal. No evident renal mass or hydronephrosis on either side. There is no appreciable renal or ureteral calculus on either side. Urinary bladder is midline with wall thickness within normal limits. Stomach/Bowel: There is no appreciable bowel wall or mesenteric thickening. The terminal ileum appears normal. There is no evident bowel obstruction. Appendix appears normal. No evident free air or portal venous air. Lymphatic: There are subcentimeter inguinal lymph nodes bilaterally, considered nonspecific and stable. There is a lymph node anterior to the inferior vena cava in the periportal region with a short axis diameter of 1.4 cm, stable. Subcentimeter retroperitoneal lymph nodes are stable. No new lymph node enlargement evident. Reproductive: Prostate prominent but stable. Seminal vesicles appear normal. Prostate abuts the inferior aspect of the bladder, a stable finding. Other: No evident abscess or ascites in the abdomen or pelvis. There is a focal umbilical hernia containing fat but no bowel, unchanged from prior study. Musculoskeletal: Degenerative changes noted in the lumbar spine. There are no blastic or lytic bone lesions. No intramuscular lesions evident. Review of the MIP images confirms the above findings. IMPRESSION: CT angiogram chest: 1. No thoracic aortic aneurysm or  dissection. Minimal aortic atherosclerosis. Visualized great vessels appear unremarkable. 2. Scattered areas of bullous disease, more severe on the right than on the left. Scattered areas of mosaic attenuation in the lower lobes likely represents underlying small airways obstructive disease. No edema or consolidation. No pleural effusions. 3. Prominent inferior pretracheal and aortopulmonary window lymph nodes of uncertain etiology. 4. Pacemaker leads attached to right heart. There is cardiomegaly. No pericardial effusion. 5.  No evident pulmonary embolus. CT angiogram abdomen; CT angiogram pelvis: 1. Mild atherosclerotic plaque in the aorta and proximal common iliac arteries. There is also atherosclerotic plaque in the internal iliac artery. No hemodynamically significant obstruction evident in the aorta, major mesenteric, and major pelvic arterial vessels. 2. No aneurysm or dissection involving the aorta, major mesenteric, and major pelvic arterial vessels. No fibromuscular dysplasia. 3. The appearance of the liver suggests a degree of cirrhosis. A prominent pericaval lymph node is stable and may be secondary to underlying hepatic cirrhosis. 4. Question a degree of gallbladder wall thickening. This finding may warrant ultrasound of the gallbladder to further assess. 5. No evident bowel wall thickening or bowel obstruction. No  abscess in the abdomen or pelvis. Appendix unremarkable. 6. No renal or ureteral calculus. No hydronephrosis. Urinary bladder wall thickness normal. 7. Prominent prostate, stable. This finding may warrant correlation with PSA. 8.  Stable umbilical hernia containing fat but no bowel. Aortic Atherosclerosis (ICD10-I70.0). Electronically Signed: By: Lowella Grip III M.D. On: 11/23/2020 09:47    Labs on Admission: I have personally reviewed following labs  CBC: Recent Labs  Lab 11/23/20 0806  WBC 5.7  HGB 14.2  HCT 41.6  MCV 96.7  PLT 431*   Basic Metabolic Panel: Recent Labs   Lab 11/23/20 0806  NA 134*  K 3.8  CL 96*  CO2 29  GLUCOSE 218*  BUN 41*  CREATININE 1.97*  CALCIUM 8.7*  MG 1.4*   GFR: Estimated Creatinine Clearance: 63.2 mL/min (A) (by C-G formula based on SCr of 1.97 mg/dL (H)).  Liver Function Tests: Recent Labs  Lab 11/23/20 0806  AST 37  ALT 45*  ALKPHOS 253*  BILITOT 1.0  PROT 7.7  ALBUMIN 3.5   Recent Labs  Lab 11/23/20 0806  LIPASE 47   Urine analysis:    Component Value Date/Time   COLORURINE YELLOW (A) 04/12/2020 0804   APPEARANCEUR HAZY (A) 04/12/2020 0804   APPEARANCEUR Hazy 08/21/2014 0800   LABSPEC 1.006 04/12/2020 0804   LABSPEC 1.015 08/21/2014 0800   PHURINE 5.0 04/12/2020 0804   GLUCOSEU NEGATIVE 04/12/2020 0804   GLUCOSEU Negative 08/21/2014 0800   HGBUR NEGATIVE 04/12/2020 0804   BILIRUBINUR NEGATIVE 04/12/2020 0804   BILIRUBINUR Negative 08/21/2014 0800   KETONESUR 5 (A) 04/12/2020 0804   PROTEINUR 30 (A) 04/12/2020 0804   NITRITE NEGATIVE 04/12/2020 0804   LEUKOCYTESUR NEGATIVE 04/12/2020 0804   LEUKOCYTESUR 1+ 08/21/2014 0800   CRITICAL CARE Performed by: Briant Cedar Cox  Total critical care time: 35 minutes  Critical care time was exclusive of separately billable procedures and treating other patients.  Critical care was necessary to treat or prevent imminent or life-threatening deterioration. Cardiac failure and circulatory failure  Critical care was time spent personally by me on the following activities: development of treatment plan with patient and/or surrogate as well as nursing, discussions with consultants, evaluation of patient's response to treatment, examination of patient, obtaining history from patient or surrogate, ordering and performing treatments and interventions, ordering and review of laboratory studies, ordering and review of radiographic studies, pulse oximetry and re-evaluation of patient's condition.  Amy N Cox D.O. [facility]  If 7PM-7AM, please contact  overnight-coverage provider If 7AM-7PM, please contact day coverage provider [email or web address]  11/23/2020, 2:18 PM

## 2020-11-23 NOTE — ED Provider Notes (Signed)
[facility] Emergency Department Provider Note  ____________________________________________  Time seen: Approximately 12:41 PM  I have reviewed the triage vital signs and the nursing notes.   HISTORY  Chief Complaint Shortness of breath   HPI Oscar [name] is a 45 y.o. male with a history of CHF, diabetes, GERD, hypertension, recent onset of atrial fibrillation started on Xarelto and amiodarone by cardiology who comes the ED complaining of worsening shortness of breath over the last 4 days associated with chest pain which was abrupt onset, constant, radiating to the back, severe,  no aggravating or alleviating factors.    He does note continued dyspnea on exertion and orthopnea which is a chronic issue for him but feels worse than usual.  He has been compliant with his medications including amiodarone, torsemide.   He does note that he has not started the Xarelto yet because he had a bleeding lesion in his groin and he was worried that would get worse.Marland Kitchen     Past Medical History:  Diagnosis Date  . AICD (automatic cardioverter/defibrillator) present   . Asthma   . Cardiomyopathy ([place])   . CHF (congestive heart failure) ([place])   . Coronary artery disease   . Deafness in right ear   . Diabetes mellitus without complication ([place])   . Dilated cardiomyopathy ([place])   . Dysrhythmia    svt  . 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 GERD (gastroesophageal reflux disease)   . Hyperlipidemia   . Hypertension   . Hypoxemia   . Hypoxemia   . Mild obesity   . Myocardial infarction ([place])    [id number],6063,0/16  . Pancreatitis   . Sleep apnea    osa     Patient Active Problem List   Diagnosis Date Noted  . Acute exacerbation of CHF (congestive heart failure) ([place]) 11/23/2020  . Hepatic cirrhosis, unspecified hepatic cirrhosis type, unspecified whether ascites present ([place]) 11/23/2020  . Type 2 diabetes mellitus with other diabetic neurological  complication ([place]) 09/09/3233  . Obesity hypoventilation syndrome ([place]) 11/23/2020  . ARF (acute renal failure) ([place]) 03/28/2020  . Acute on chronic combined systolic (congestive) and diastolic (congestive) heart failure ([place]) 02/27/2020  . COPD (chronic obstructive pulmonary disease) ([place]) 02/27/2020  . Sprain of foot 01/26/2020  . Foreign body granuloma of skin and subcutaneous tissue 05/26/2019  . Acute pancreatitis 05/19/2017  . Pancreatitis 09/12/2016  . Bronchitis 05/20/2016  . Constipation 11/08/2015  . Abdominal pain 11/02/2015  . Obstructive sleep apnea 07/31/2015  . Tobacco use 07/31/2015  . Diabetes ([place]) 07/10/2015  . Chronic systolic heart failure ([place]) 03/30/2015  . Cardiac pacemaker 03/30/2015  . Chronic respiratory failure with hypercapnia ([place]) 12/12/2014  . Elevated troponin   . Non-ischemic cardiomyopathy ([place])   . Hypoxemia   . Acute combined systolic and diastolic heart failure ([place])   . SVT (supraventricular tachycardia) ([place]) 12/09/2014  . NSTEMI (non-ST elevated myocardial infarction) ([place]) 12/09/2014  . Hypertension 12/09/2014     Past Surgical History:  Procedure Laterality Date  . CARDIAC CATHETERIZATION  12/11/2014   Procedure: RIGHT/LEFT HEART CATH AND CORONARY ANGIOGRAPHY;  Surgeon: Lorretta Harp, MD;  Location: [facility] CATH LAB;  Service: Cardiovascular;;  . ICD LEAD REMOVAL N/A 03/30/2015   Procedure: ICD LEAD REMOVAL;  Surgeon: Marzetta Board, MD;  Location: [facility] ORS;  Service: Cardiovascular;  Laterality: N/A;  . IMPLANTABLE CARDIOVERTER DEFIBRILLATOR IMPLANT    . INSERT / REPLACE / REMOVE PACEMAKER    . LEFT  HEART CATHETERIZATION WITH CORONARY ANGIOGRAM N/A 12/09/2014   Procedure: LEFT HEART CATHETERIZATION WITH CORONARY ANGIOGRAM;  Surgeon: Burnell Blanks, MD;  Location: [facility] CATH LAB;  Service: Cardiovascular;  Laterality: N/A;     Prior to Admission medications   Medication Sig Start Date End Date Taking? Authorizing Provider   amiodarone (PACERONE) 200 MG tablet Take 200 mg by mouth 2 (two) times daily. 11/15/20 11/29/20 Yes [provider]  atorvastatin (LIPITOR) 40 MG tablet Take 1 tablet (40 mg total) by mouth daily at 6 PM. Patient taking differently: Take 40 mg by mouth at bedtime. 11/04/18  Yes Hackney, Tina A, FNP  empagliflozin (JARDIANCE) 10 MG TABS tablet Take by mouth daily.   Yes [provider]  ENTRESTO 49-51 MG Take 1 tablet by mouth 2 (two) times daily. 11/09/20  Yes [provider]  omeprazole (PRILOSEC) 40 MG capsule Take 40 mg by mouth daily.   Yes [provider]  rivaroxaban (XARELTO) 20 MG TABS tablet Take 20 mg by mouth daily with supper.   Yes [provider]  tiotropium (SPIRIVA) 18 MCG inhalation capsule Place 18 mcg into inhaler and inhale daily.   Yes [provider]  torsemide (DEMADEX) 20 MG tablet Take 40 mg by mouth daily.   Yes [provider]  umeclidinium-vilanterol (ANORO ELLIPTA) 62.5-25 MCG/INH AEPB Inhale 1 puff into the lungs daily in the afternoon. 01/31/19  Yes [provider]  clopidogrel (PLAVIX) 75 MG tablet Take 1 tablet (75 mg total) by mouth daily. Patient not taking: No sig reported 02/28/20   Clabe Seal, PA-C  sacubitril-valsartan (ENTRESTO) 97-103 MG Take 1 tablet by mouth 2 (two) times daily. Patient not taking: No sig reported 08/13/20   Alisa Graff, FNP     Allergies Bidil [isosorb dinitrate-hydralazine] and Ciprofloxacin   Family History  Problem Relation Age of Onset  . Hypertension Mother   . Congestive Heart Failure Mother   . Hypertension Sister   . Diabetes Sister   . Pancreatitis Sister   . COPD Sister   . Pancreatitis Brother   . Anemia Neg Hx   . Arrhythmia Neg Hx   . Asthma Neg Hx   . Clotting disorder Neg Hx   . Fainting Neg Hx   . Heart attack Neg Hx   . Heart disease Neg Hx   . Heart failure Neg Hx   . Hyperlipidemia Neg Hx     Social History Social History    Tobacco Use  . Smoking status: Current Every Day Smoker    Packs/day: 0.50    Years: 24.00    Pack years: 12.00    Types: Cigarettes  . Smokeless tobacco: Never Used  . Tobacco comment: 4/5 Smoking 5 cigs a day  Vaping Use  . Vaping Use: Never used  Substance Use Topics  . Alcohol use: No  . Drug use: No    Review of Systems  Constitutional:   No fever or chills.  ENT:   No sore throat. No rhinorrhea. Cardiovascular: Positive chest pain as above without syncope. Respiratory: Positive shortness of breath without cough. Gastrointestinal:   Negative for abdominal pain, vomiting and diarrhea.  Musculoskeletal:   Negative for focal pain or swelling All other systems reviewed and are negative except as documented above in ROS and HPI.  ____________________________________________   PHYSICAL EXAM:  VITAL SIGNS: ED Triage Vitals  Enc Vitals Group     BP 11/23/20 0829 124/86     Pulse Rate 11/23/20 0806 Marland Kitchen)  122     Resp 11/23/20 0806 (!) 29     Temp 11/23/20 0807 98.2 F (36.8 C)     Temp Source 11/23/20 0807 Oral     SpO2 11/23/20 0806 98 %     Weight 11/23/20 0802 (!) 308 lb 10.3 oz (140 kg)     Height 11/23/20 0802 5\' 6"  (1.676 m)     Head Circumference --      Peak Flow --      Pain Score 11/23/20 0802 7     Pain Loc --      Pain Edu? --      Excl. in [place]? --     Vital signs reviewed, nursing assessments reviewed.   Constitutional:   Alert and oriented.  Ill-appearing. Eyes:   Conjunctivae are normal. EOMI. PERRL. ENT      Head:   Normocephalic and atraumatic.      Nose:   Wearing a mask.      Mouth/Throat:   Wearing a mask.      Neck:   No meningismus. Full ROM. Hematological/Lymphatic/Immunilogical:   No cervical lymphadenopathy. Cardiovascular:   Irregularly irregular rhythm, tachycardia heart rate 120-135. Symmetric bilateral radial and DP pulses.  No murmurs. Cap refill less than 2 seconds. Respiratory:   Normal respiratory effort without  tachypnea/retractions. Breath sounds are clear and equal bilaterally. No wheezes/rales/rhonchi. Gastrointestinal:   Soft and nontender. Non distended. There is no CVA tenderness.  No rebound, rigidity, or guarding. Genitourinary:   Normal.  No abscess or inflammatory skin changes, no crepitus.  Perineum has small area of skin thickening, suspect ingrown hair but not infected. Musculoskeletal:   Normal range of motion in all extremities. No joint effusions.  No lower extremity tenderness.  Trace pitting edema bilateral lower extremities Neurologic:   Normal speech and language.  Motor grossly intact. No acute focal neurologic deficits are appreciated.  Skin:    Skin is warm, dry and intact. No rash noted.  No petechiae, purpura, or bullae.  ____________________________________________    LABS (pertinent positives/negatives) (all labs ordered are listed, but only abnormal results are displayed) Labs Reviewed  BASIC METABOLIC PANEL - Abnormal; Notable for the following components:      Result Value   Sodium 134 (*)    Chloride 96 (*)    Glucose, Bld 218 (*)    BUN 41 (*)    Creatinine, Ser 1.97 (*)    Calcium 8.7 (*)    GFR, Estimated 42 (*)    All other components within normal limits  CBC - Abnormal; Notable for the following components:   Platelets 141 (*)    All other components within normal limits  BRAIN NATRIURETIC PEPTIDE - Abnormal; Notable for the following components:   B Natriuretic Peptide 1,173.5 (*)    All other components within normal limits  HEPATIC FUNCTION PANEL - Abnormal; Notable for the following components:   ALT 45 (*)    Alkaline Phosphatase 253 (*)    Bilirubin, Direct 0.3 (*)    All other components within normal limits  MAGNESIUM - Abnormal; Notable for the following components:   Magnesium 1.4 (*)    All other components within normal limits  TROPONIN I (HIGH SENSITIVITY) - Abnormal; Notable for the following components:   Troponin I (High  Sensitivity) 73 (*)    All other components within normal limits  TROPONIN I (HIGH SENSITIVITY) - Abnormal; Notable for the following components:   Troponin I (High Sensitivity) 63 (*)  All other components within normal limits  RESP PANEL BY RT-PCR (FLU A&B, COVID) ARPGX2  LIPASE, BLOOD   ____________________________________________   EKG  Interpreted by me Atrial fibrillation with RVR, rate of 122.  Normal axis, left bundle branch block.  No acute ischemic changes.  ____________________________________________    RDEYCXKGY  DG Chest 2 View  Result Date: 11/23/2020 CLINICAL DATA:  Shortness of breath EXAM: CHEST - 2 VIEW COMPARISON:  03/28/2020 FINDINGS: Cardiomegaly. New interstitial prominence diffusely. Dual-chamber ICD/pacer leads in unchanged position. No effusion or pneumothorax seen. IMPRESSION: CHF pattern. Electronically Signed   By: Monte Fantasia M.D.   On: 11/23/2020 08:45   CT Angio Chest/Abd/Pel for Dissection W and/or Wo Contrast  Addendum Date: 11/23/2020   ADDENDUM REPORT: 11/23/2020 09:54 ADDENDUM: Add to addendum of chest CT angiogram: Enlargement of the main pulmonary outflow tract, a finding indicative of pulmonary arterial hypertension. Electronically Signed   By: Lowella Grip III M.D.   On: 11/23/2020 09:54   Result Date: 11/23/2020 CLINICAL DATA:  Chest and abdominal pain radiating toward back. Shortness of breath EXAM: CT ANGIOGRAPHY CHEST, ABDOMEN AND PELVIS TECHNIQUE: Non-contrast CT of the chest was initially obtained. Multidetector CT imaging through the chest, abdomen and pelvis was performed using the standard protocol during bolus administration of intravenous contrast. Multiplanar reconstructed images and MIPs were obtained and reviewed to evaluate the vascular anatomy. CONTRAST:  184mL OMNIPAQUE IOHEXOL 350 MG/ML SOLN COMPARISON:  Chest radiograph November 23, 2020; CT abdomen and pelvis March 28, 2020. Chest radiograph May 28, 2017 FINDINGS:  CTA CHEST FINDINGS Cardiovascular: No evident intramural hematoma in the thoracic aorta on noncontrast enhanced study. There is no evident mediastinal hematoma. There is no appreciable thoracic aortic aneurysm or dissection. Visualized great vessels appear unremarkable. Note that the right innominate and left common carotid arteries arise as a common trunk, an anatomic variant. There is no appreciable aneurysm or dissection involving the visualized great vessels. No appreciable atherosclerotic plaque noted in the visualized great vessels. Rather minimal atherosclerotic calcification noted in the aorta. No evidence of aortic ulceration in the thoracic region. No appreciable pulmonary embolus. Main pulmonary outflow tract measures 4.0 cm, enlarged. Heart is enlarged with pacemaker leads attached to the right atrium and right ventricle. No pericardial effusion or pericardial thickening. Mediastinum/Nodes: Visualized thyroid appears unremarkable. There is lymph node enlargement slightly to the right of the lower trachea measuring 1.5 x 1.5 cm. A lymph node in the aortopulmonary window region measures 1.2 x 1.1 cm. There are scattered subcentimeter mediastinal lymph nodes as well. No esophageal lesions are evident. Lungs/Pleura: Scattered bullae are noted in the apices as well as throughout portions of the right lower lobe. There is no edema or consolidation. No pleural effusions. There are areas of mosaic attenuation in the lower lobe regions bilaterally. No pneumothorax. Trachea and major bronchial structures appear normal. Musculoskeletal: No acute appearing fracture or dislocation. There is mild anterior wedging of the T8, T9, and T10 vertebral bodies, stable. No blastic or lytic bone lesions. No evident chest wall lesions. Review of the MIP images confirms the above findings. CTA ABDOMEN AND PELVIS FINDINGS VASCULAR Aorta: There is no abdominal aortic aneurysm or dissection. There is modest plaque in the distal  aorta, not causing hemodynamically significant obstruction. Celiac: Celiac artery and its branches are widely patent. No aneurysm or dissection involving these vessels. SMA: Superior mesenteric artery and its branches are widely patent. No aneurysm or dissection involving these vessels. Renals: There are 2 renal arteries on each  side. There is no appreciable aneurysm or dissection involving renal arteries or respective branches. No fibromuscular dysplasia. No appreciable atherosclerotic irregularity in these vessels. IMA: Inferior mesenteric artery and its branches are widely patent. No aneurysm or dissection involving these vessels. Inflow: Minimal atherosclerotic calcification is noted at the origins of each common iliac artery. There is also calcification in the proximal left internal iliac artery. Other major arterial pelvic vessels are widely patent. No aneurysm or dissection involving pelvic arterial vessels noted. Visualized profunda femoral and superficial femoral arteries are patent without aneurysm or dissection involving these vessels. Veins: No obvious venous abnormality within the limitations of this arterial phase study. Review of the MIP images confirms the above findings. NON-VASCULAR Hepatobiliary: Liver measures 20.9 cm in length. Subtle irregularity along the liver capsule again noted. No focal liver lesions are appreciable. There is questionable thickening of the gallbladder wall. No evident biliary duct dilatation. Pancreas: No evident pancreatic mass or inflammatory focus. Spleen: No splenic lesions are appreciable. Adrenals/Urinary Tract: Adrenals bilaterally appear normal. No evident renal mass or hydronephrosis on either side. There is no appreciable renal or ureteral calculus on either side. Urinary bladder is midline with wall thickness within normal limits. Stomach/Bowel: There is no appreciable bowel wall or mesenteric thickening. The terminal ileum appears normal. There is no evident  bowel obstruction. Appendix appears normal. No evident free air or portal venous air. Lymphatic: There are subcentimeter inguinal lymph nodes bilaterally, considered nonspecific and stable. There is a lymph node anterior to the inferior vena cava in the periportal region with a short axis diameter of 1.4 cm, stable. Subcentimeter retroperitoneal lymph nodes are stable. No new lymph node enlargement evident. Reproductive: Prostate prominent but stable. Seminal vesicles appear normal. Prostate abuts the inferior aspect of the bladder, a stable finding. Other: No evident abscess or ascites in the abdomen or pelvis. There is a focal umbilical hernia containing fat but no bowel, unchanged from prior study. Musculoskeletal: Degenerative changes noted in the lumbar spine. There are no blastic or lytic bone lesions. No intramuscular lesions evident. Review of the MIP images confirms the above findings. IMPRESSION: CT angiogram chest: 1. No thoracic aortic aneurysm or dissection. Minimal aortic atherosclerosis. Visualized great vessels appear unremarkable. 2. Scattered areas of bullous disease, more severe on the right than on the left. Scattered areas of mosaic attenuation in the lower lobes likely represents underlying small airways obstructive disease. No edema or consolidation. No pleural effusions. 3. Prominent inferior pretracheal and aortopulmonary window lymph nodes of uncertain etiology. 4. Pacemaker leads attached to right heart. There is cardiomegaly. No pericardial effusion. 5.  No evident pulmonary embolus. CT angiogram abdomen; CT angiogram pelvis: 1. Mild atherosclerotic plaque in the aorta and proximal common iliac arteries. There is also atherosclerotic plaque in the internal iliac artery. No hemodynamically significant obstruction evident in the aorta, major mesenteric, and major pelvic arterial vessels. 2. No aneurysm or dissection involving the aorta, major mesenteric, and major pelvic arterial vessels.  No fibromuscular dysplasia. 3. The appearance of the liver suggests a degree of cirrhosis. A prominent pericaval lymph node is stable and may be secondary to underlying hepatic cirrhosis. 4. Question a degree of gallbladder wall thickening. This finding may warrant ultrasound of the gallbladder to further assess. 5. No evident bowel wall thickening or bowel obstruction. No abscess in the abdomen or pelvis. Appendix unremarkable. 6. No renal or ureteral calculus. No hydronephrosis. Urinary bladder wall thickness normal. 7. Prominent prostate, stable. This finding may warrant correlation with PSA. 8.  Stable umbilical hernia containing fat but no bowel. Aortic Atherosclerosis (ICD10-I70.0). Electronically Signed: By: Lowella Grip III M.D. On: 11/23/2020 09:47    ____________________________________________   PROCEDURES .Critical Care Performed by: Carrie Mew, MD Authorized by: Carrie Mew, MD   Critical care provider statement:    Critical care time (minutes):  35   Critical care time was exclusive of:  Separately billable procedures and treating other patients   Critical care was necessary to treat or prevent imminent or life-threatening deterioration of the following conditions:  Cardiac failure and circulatory failure   Critical care was time spent personally by me on the following activities:  Development of treatment plan with patient or surrogate, discussions with consultants, evaluation of patient's response to treatment, examination of patient, obtaining history from patient or surrogate, ordering and performing treatments and interventions, ordering and review of laboratory studies, ordering and review of radiographic studies, pulse oximetry, re-evaluation of patient's condition and review of old charts Comments:        .1-3 Lead EKG Interpretation Performed by: Carrie Mew, MD Authorized by: Carrie Mew, MD     Interpretation: abnormal     ECG rate:   125   ECG rate assessment: tachycardic     Rhythm: atrial fibrillation     Ectopy: none     Conduction: normal      ____________________________________________  DIFFERENTIAL DIAGNOSIS   Worsened atrial fibrillation with RVR, pulmonary edema, pleural effusion, aortic dissection, pneumonia, non-STEMI  CLINICAL IMPRESSION / ASSESSMENT AND PLAN / ED COURSE  Medications ordered in the ED: Medications  diltiazem (CARDIZEM) 125 mg in dextrose 5% 125 mL (1 mg/mL) infusion (5 mg/hr Intravenous New Bag/Given 11/23/20 0946)  furosemide (LASIX) injection 80 mg (has no administration in time range)  sodium chloride 0.9 % bolus 500 mL (0 mLs Intravenous Stopped 11/23/20 1015)  magnesium sulfate IVPB 2 g 50 mL (0 g Intravenous Stopped 11/23/20 1015)  iohexol (OMNIPAQUE) 350 MG/ML injection 100 mL (100 mLs Intravenous Contrast Given 11/23/20 0905)  nitroGLYCERIN (NITROGLYN) 2 % ointment 1 inch (1 inch Topical Given 11/23/20 0947)    Pertinent labs & imaging results that were available during my care of the patient were reviewed by me and considered in my medical decision making (see chart for details).  Oscar [name] was evaluated in Emergency Department on 11/23/2020 for the symptoms described in the history of present illness. He was evaluated in the context of the global COVID-19 pandemic, which necessitated consideration that the patient might be at risk for infection with the SARS-CoV-2 virus that causes COVID-19. Institutional protocols and algorithms that pertain to the evaluation of patients at risk for COVID-19 are in a state of rapid change based on information released by regulatory bodies including the CDC and federal and state organizations. These policies and algorithms were followed during the patient's care in the ED.     Clinical Course as of 11/23/20 1241  Fri Nov 23, 2020  7741 Patient presents with abrupt onset severe chest pain radiating to back, relatively diminished left radial  pulse, concerning for aortic dissection.  Chest x-ray viewed and interpreted by me, shows loss of normal aortic knob contour, possibly widened aortic shadow compared to previous x-ray from June 2021.  Will need to proceed with CT angiogram.  Also has tachycardia, A. fib and RVR on the monitor.  Will start diltiazem infusion without bolus with careful attention to blood pressure.  Will give magnesium bolus.  Patient has not yet started his anticoagulant  due to concerns about a bleeding skin lesion in his groin which currently is hemostatic and does not appear infected. [PS]  0930 CT angiogram images viewed and interpreted by me, negative for dissection, large vessel system widely patent.  Umbilical hernia contains fat only, no bowel. [PS]    Clinical Course User Index [PS] Carrie Mew, MD     ----------------------------------------- 12:53 PM on 11/23/2020 -----------------------------------------  Patient given nitroglycerin ointment on the chest to see if this assists his chest pain which may be rate related from his atrial fibrillation.  CT is negative for dissection or other acute findings.  Labs overall reassuring.  Heart rate becoming more controlled on diltiazem drip.  Case discussed with hospitalist for further management. ____________________________________________   FINAL CLINICAL IMPRESSION(S) / ED DIAGNOSES    Final diagnoses:  Atrial fibrillation with RVR (HCC)  Chronic systolic congestive heart failure (HCC)  Hepatic cirrhosis, unspecified hepatic cirrhosis type, unspecified whether ascites present ([place])  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)  Obesity hypoventilation syndrome ([place])  Chronic obstructive pulmonary disease, unspecified COPD type [facility])     ED Discharge Orders    None      Portions of this note were generated with dragon dictation software. Dictation errors may occur despite best attempts at proofreading.    Carrie Mew, MD 11/23/20 1255

## 2020-11-23 NOTE — Progress Notes (Signed)
Made Dr. Tobie Poet aware patient did not take xarelto due to a groin lesion that had been bleeding. Currently lesion is not bleeding. Per md she will come to see patient and explain to him why he needs to take xarelto. Patient currently afib 90-110's

## 2020-11-23 NOTE — Progress Notes (Signed)
   11/23/20 1433  Clinical Encounter Type  Visited With Patient  Visit Type Initial  Referral From Nurse  Consult/Referral To [place] responded to an OR for room 2A-245A, Pt [facility]. I went over one AD with Pt and explained to him what it was for. Pt stated, he do not wish to complete the AD at the time but I can leave the booklet with him. Left one booklet with Pt.

## 2020-11-23 NOTE — ED Triage Notes (Addendum)
See first nurse note- pt reports  L chest pain that radiates into back, worst with exertion but constant. Pt states feeling is tight. Reports associated shortness of breath. States he has PRN oxygen at home but has been unable to catch his breath x4 days. Worst when lying down.

## 2020-11-23 NOTE — Progress Notes (Signed)
*  PRELIMINARY RESULTS* Echocardiogram 2D Echocardiogram has been performed.  Oscar [name] 11/23/2020, 2:54 PM

## 2020-11-24 DIAGNOSIS — I110 Hypertensive heart disease with heart failure: Secondary | ICD-10-CM | POA: Diagnosis present

## 2020-11-24 DIAGNOSIS — I5023 Acute on chronic systolic (congestive) heart failure: Secondary | ICD-10-CM | POA: Diagnosis not present

## 2020-11-24 DIAGNOSIS — E662 Morbid (severe) obesity with alveolar hypoventilation: Secondary | ICD-10-CM | POA: Diagnosis present

## 2020-11-24 DIAGNOSIS — I420 Dilated cardiomyopathy: Secondary | ICD-10-CM | POA: Diagnosis present

## 2020-11-24 DIAGNOSIS — I5041 Acute combined systolic (congestive) and diastolic (congestive) heart failure: Secondary | ICD-10-CM | POA: Diagnosis not present

## 2020-11-24 DIAGNOSIS — H9191 Unspecified hearing loss, right ear: Secondary | ICD-10-CM | POA: Diagnosis present

## 2020-11-24 DIAGNOSIS — Z7902 Long term (current) use of antithrombotics/antiplatelets: Secondary | ICD-10-CM | POA: Diagnosis not present

## 2020-11-24 DIAGNOSIS — Z6841 Body Mass Index (BMI) 40.0 and over, adult: Secondary | ICD-10-CM | POA: Diagnosis not present

## 2020-11-24 DIAGNOSIS — I480 Paroxysmal atrial fibrillation: Secondary | ICD-10-CM | POA: Diagnosis present

## 2020-11-24 DIAGNOSIS — K7460 Unspecified cirrhosis of liver: Secondary | ICD-10-CM | POA: Diagnosis present

## 2020-11-24 DIAGNOSIS — Z720 Tobacco use: Secondary | ICD-10-CM | POA: Diagnosis not present

## 2020-11-24 DIAGNOSIS — Z79899 Other long term (current) drug therapy: Secondary | ICD-10-CM | POA: Diagnosis not present

## 2020-11-24 DIAGNOSIS — J9612 Chronic respiratory failure with hypercapnia: Secondary | ICD-10-CM | POA: Diagnosis not present

## 2020-11-24 DIAGNOSIS — R0602 Shortness of breath: Secondary | ICD-10-CM | POA: Diagnosis present

## 2020-11-24 DIAGNOSIS — E785 Hyperlipidemia, unspecified: Secondary | ICD-10-CM | POA: Diagnosis present

## 2020-11-24 DIAGNOSIS — I10 Essential (primary) hypertension: Secondary | ICD-10-CM | POA: Diagnosis not present

## 2020-11-24 DIAGNOSIS — I5022 Chronic systolic (congestive) heart failure: Secondary | ICD-10-CM | POA: Diagnosis not present

## 2020-11-24 DIAGNOSIS — K219 Gastro-esophageal reflux disease without esophagitis: Secondary | ICD-10-CM | POA: Diagnosis present

## 2020-11-24 DIAGNOSIS — J449 Chronic obstructive pulmonary disease, unspecified: Secondary | ICD-10-CM | POA: Diagnosis present

## 2020-11-24 DIAGNOSIS — F17210 Nicotine dependence, cigarettes, uncomplicated: Secondary | ICD-10-CM | POA: Diagnosis present

## 2020-11-24 DIAGNOSIS — Z95810 Presence of automatic (implantable) cardiac defibrillator: Secondary | ICD-10-CM | POA: Diagnosis not present

## 2020-11-24 DIAGNOSIS — Z20822 Contact with and (suspected) exposure to covid-19: Secondary | ICD-10-CM | POA: Diagnosis present

## 2020-11-24 DIAGNOSIS — R778 Other specified abnormalities of plasma proteins: Secondary | ICD-10-CM | POA: Diagnosis not present

## 2020-11-24 DIAGNOSIS — J810 Acute pulmonary edema: Secondary | ICD-10-CM | POA: Diagnosis not present

## 2020-11-24 DIAGNOSIS — I5043 Acute on chronic combined systolic (congestive) and diastolic (congestive) heart failure: Secondary | ICD-10-CM | POA: Diagnosis present

## 2020-11-24 DIAGNOSIS — N179 Acute kidney failure, unspecified: Secondary | ICD-10-CM | POA: Diagnosis present

## 2020-11-24 DIAGNOSIS — I2510 Atherosclerotic heart disease of native coronary artery without angina pectoris: Secondary | ICD-10-CM | POA: Diagnosis present

## 2020-11-24 DIAGNOSIS — I4891 Unspecified atrial fibrillation: Secondary | ICD-10-CM | POA: Diagnosis not present

## 2020-11-24 DIAGNOSIS — I252 Old myocardial infarction: Secondary | ICD-10-CM | POA: Diagnosis not present

## 2020-11-24 DIAGNOSIS — D696 Thrombocytopenia, unspecified: Secondary | ICD-10-CM | POA: Diagnosis present

## 2020-11-24 DIAGNOSIS — I5033 Acute on chronic diastolic (congestive) heart failure: Secondary | ICD-10-CM | POA: Diagnosis present

## 2020-11-24 DIAGNOSIS — E119 Type 2 diabetes mellitus without complications: Secondary | ICD-10-CM | POA: Diagnosis present

## 2020-11-24 DIAGNOSIS — Z7984 Long term (current) use of oral hypoglycemic drugs: Secondary | ICD-10-CM | POA: Diagnosis not present

## 2020-11-24 DIAGNOSIS — J811 Chronic pulmonary edema: Secondary | ICD-10-CM | POA: Diagnosis not present

## 2020-11-24 DIAGNOSIS — J9611 Chronic respiratory failure with hypoxia: Secondary | ICD-10-CM | POA: Diagnosis present

## 2020-11-24 LAB — GLUCOSE, CAPILLARY
Glucose-Capillary: 221 mg/dL — ABNORMAL HIGH (ref 70–99)
Glucose-Capillary: 197 mg/dL — ABNORMAL HIGH (ref 70–99)
Glucose-Capillary: 225 mg/dL — ABNORMAL HIGH (ref 70–99)

## 2020-11-24 LAB — CBC WITH DIFFERENTIAL/PLATELET
Abs Immature Granulocytes: 0.01 10*3/uL (ref 0.00–0.07)
Basophils Absolute: 0 10*3/uL (ref 0.0–0.1)
Basophils Relative: 0 %
Eosinophils Absolute: 0.2 10*3/uL (ref 0.0–0.5)
Eosinophils Relative: 3 %
HCT: 40.3 % (ref 39.0–52.0)
Hemoglobin: 13.6 g/dL (ref 13.0–17.0)
Immature Granulocytes: 0 %
Lymphocytes Relative: 37 %
Lymphs Abs: 2.1 10*3/uL (ref 0.7–4.0)
MCH: 33.2 pg (ref 26.0–34.0)
MCHC: 33.7 g/dL (ref 30.0–36.0)
MCV: 98.3 fL (ref 80.0–100.0)
Monocytes Absolute: 0.4 10*3/uL (ref 0.1–1.0)
Monocytes Relative: 7 %
Neutro Abs: 3 10*3/uL (ref 1.7–7.7)
Neutrophils Relative %: 53 %
Platelets: 138 10*3/uL — ABNORMAL LOW (ref 150–400)
RBC: 4.1 MIL/uL — ABNORMAL LOW (ref 4.22–5.81)
RDW: 13.3 % (ref 11.5–15.5)
WBC: 5.6 10*3/uL (ref 4.0–10.5)
nRBC: 0 % (ref 0.0–0.2)

## 2020-11-24 LAB — BASIC METABOLIC PANEL
Anion gap: 9 (ref 5–15)
BUN: 36 mg/dL — ABNORMAL HIGH (ref 6–20)
CO2: 30 mmol/L (ref 22–32)
Calcium: 8.7 mg/dL — ABNORMAL LOW (ref 8.9–10.3)
Chloride: 97 mmol/L — ABNORMAL LOW (ref 98–111)
Creatinine, Ser: 1.84 mg/dL — ABNORMAL HIGH (ref 0.61–1.24)
GFR, Estimated: 46 mL/min — ABNORMAL LOW (ref >60)
Glucose, Bld: 173 mg/dL — ABNORMAL HIGH (ref 70–99)
Potassium: 3.6 mmol/L (ref 3.5–5.1)
Sodium: 136 mmol/L (ref 135–145)

## 2020-11-24 LAB — TSH: TSH: 2.25 u[IU]/mL (ref 0.350–4.500)

## 2020-11-24 LAB — HEMOGLOBIN A1C
Hgb A1c MFr Bld: 7.8 % — ABNORMAL HIGH (ref 4.8–5.6)
Mean Plasma Glucose: 177.16 mg/dL

## 2020-11-24 LAB — MAGNESIUM: Magnesium: 1.8 mg/dL (ref 1.7–2.4)

## 2020-11-24 MED ORDER — FUROSEMIDE 10 MG/ML IJ SOLN
80.0000 mg | Freq: Two times a day (BID) | INTRAMUSCULAR | Status: AC
  Administered 2020-11-24: 80 mg via INTRAVENOUS
  Filled 2020-11-24: qty 8

## 2020-11-24 MED ORDER — SACUBITRIL-VALSARTAN 97-103 MG PO TABS
1.0000 | ORAL_TABLET | Freq: Two times a day (BID) | ORAL | Status: DC
  Administered 2020-11-24 – 2020-11-25 (×3): 1 via ORAL
  Filled 2020-11-24 (×4): qty 1

## 2020-11-24 MED ORDER — INSULIN ASPART 100 UNIT/ML ~~LOC~~ SOLN
0.0000 [IU] | Freq: Three times a day (TID) | SUBCUTANEOUS | Status: DC
  Administered 2020-11-24: 3 [IU] via SUBCUTANEOUS
  Administered 2020-11-24: 5 [IU] via SUBCUTANEOUS
  Administered 2020-11-25 (×2): 3 [IU] via SUBCUTANEOUS
  Filled 2020-11-24 (×4): qty 1

## 2020-11-24 MED ORDER — DILTIAZEM HCL 30 MG PO TABS
30.0000 mg | ORAL_TABLET | Freq: Four times a day (QID) | ORAL | Status: DC | PRN
Start: 2020-11-24 — End: 2020-11-25

## 2020-11-24 MED ORDER — INSULIN ASPART 100 UNIT/ML ~~LOC~~ SOLN
0.0000 [IU] | Freq: Every day | SUBCUTANEOUS | Status: DC
  Administered 2020-11-24: 2 [IU] via SUBCUTANEOUS
  Filled 2020-11-24: qty 1

## 2020-11-24 NOTE — Progress Notes (Signed)
PT Cancellation Note  Patient Details Name: THIERRY DOBOSZ MRN: [id number] DOB: 1975-11-02   Cancelled Treatment:    Reason Eval/Treat Not Completed: Medical issues which prohibited therapy. Pt with elevated troponin 2/2 congestive heart failure, now with EF <20%. Will conservatively hold for results of STAT Cardiology consult placed earlier today.    Chavis Tessler A Decorey Wahlert 11/24/2020, 12:45 PM

## 2020-11-24 NOTE — Consult Note (Signed)
[facility]  CARDIOLOGY CONSULT NOTE               Patient ID: Oscar [name] MRN: [id number] DOB/AGE: [birth year] 46 y.o.  Admit date: 11/23/2020 Referring Physician Dr. 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  Patient is complaining of worsening shortness of breath heart failure PND orthopnea recently.  He was recently seen in cardiology office and adjustments were made in medication including increasing Entresto and switching from Lasix to torsemide is unclear with the patient was able to make denies any changes before he came to the emergency room for evaluation and subsequent admission denies any significant chest pain still smokes is not clear if he is compliant with his diet he is definitely not compliant with obstructive sleep apnea and CPAP  Review of systems complete and found to be negative unless listed above     Past Medical History:  Diagnosis Date  . AICD (automatic cardioverter/defibrillator) present   . Asthma   . Cardiomyopathy ([place])   . CHF (congestive heart failure) ([place])   . Coronary artery disease   . Deafness in right ear   . Diabetes mellitus without complication ([place])   . Dilated cardiomyopathy ([place])   . Dysrhythmia    svt  . 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 GERD (gastroesophageal reflux disease)   . Hyperlipidemia   . Hypertension   . Hypoxemia   . Hypoxemia   . Mild obesity   . Myocardial infarction ([place])    [id number],4/26  . Pancreatitis   . Sleep apnea    osa    Past Surgical History:  Procedure Laterality  Date  . CARDIAC CATHETERIZATION  12/11/2014   Procedure: RIGHT/LEFT HEART CATH AND CORONARY ANGIOGRAPHY;  Surgeon: Lorretta Harp, MD;  Location: [facility] CATH LAB;  Service: Cardiovascular;;  . ICD LEAD REMOVAL N/A 03/30/2015   Procedure: ICD LEAD REMOVAL;  Surgeon: Marzetta Board, MD;  Location: [facility] ORS;  Service: Cardiovascular;  Laterality: N/A;  . IMPLANTABLE CARDIOVERTER DEFIBRILLATOR IMPLANT    . INSERT / REPLACE / REMOVE PACEMAKER    . LEFT HEART CATHETERIZATION WITH CORONARY ANGIOGRAM N/A 12/09/2014   Procedure: LEFT HEART CATHETERIZATION WITH CORONARY ANGIOGRAM;  Surgeon: Burnell Blanks, MD;  Location: [facility] CATH LAB;  Service: Cardiovascular;  Laterality: N/A;    Medications Prior to Admission  Medication Sig Dispense Refill Last Dose  . amiodarone (PACERONE) 200 MG tablet Take 200 mg by mouth 2 (two) times daily.   11/23/2020 at 0330  . atorvastatin (LIPITOR) 40 MG tablet Take 1 tablet (40 mg total) by mouth daily at 6 PM. (Patient taking differently: Take 40 mg by mouth at bedtime.) 90 tablet 3 Past Week at Unknown time  . empagliflozin (JARDIANCE) 10 MG TABS tablet Take by mouth daily.   Past Week at Unknown time  . ENTRESTO 49-51 MG Take 1 tablet by mouth 2 (two) times daily.   Past Week at Unknown time  . omeprazole (PRILOSEC) 40 MG capsule Take 40 mg by mouth daily.   Past Week at Unknown time  . rivaroxaban (XARELTO) 20 MG TABS tablet Take 20 mg by mouth daily with  supper.   Past Week at Unknown time  . tiotropium (SPIRIVA) 18 MCG inhalation capsule Place 18 mcg into inhaler and inhale daily.   Past Month at Unknown time  . torsemide (DEMADEX) 20 MG tablet Take 40 mg by mouth daily.   Past Week at Unknown time  . umeclidinium-vilanterol (ANORO ELLIPTA) 62.5-25 MCG/INH AEPB Inhale 1 puff into the lungs daily in the afternoon.   Past Month at Unknown time  . clopidogrel (PLAVIX) 75 MG tablet Take 1 tablet (75 mg total) by mouth daily. (Patient not taking: No sig reported) 30 tablet  11 Completed Course at Unknown time  . sacubitril-valsartan (ENTRESTO) 97-103 MG Take 1 tablet by mouth 2 (two) times daily. (Patient not taking: No sig reported) 60 tablet 5 Not Taking at Unknown time   Social History   Socioeconomic History  . Marital status: Single    Spouse name: Not on file  . Number of children: Not on file  . Years of education: Not on file  . Highest education level: Not on file  Occupational History  . Occupation: unemployed  Tobacco Use  . Smoking status: Current Every Day Smoker    Packs/day: 0.50    Years: 24.00    Pack years: 12.00    Types: Cigarettes  . Smokeless tobacco: Never Used  . Tobacco comment: 4/5 Smoking 5 cigs a day  Vaping Use  . Vaping Use: Never used  Substance and Sexual Activity  . Alcohol use: No  . Drug use: No  . Sexual activity: Yes  Other Topics Concern  . Not on file  Social History Narrative  . Not on file   Social Determinants of Health   Financial Resource Strain: Not on file  Food Insecurity: Not on file  Transportation Needs: Not on file  Physical Activity: Not on file  Stress: Not on file  Social Connections: Not on file  Intimate Partner Violence: Not on file    Family History  Problem Relation Age of Onset  . Hypertension Mother   . Congestive Heart Failure Mother   . Hypertension Sister   . Diabetes Sister   . Pancreatitis Sister   . COPD Sister   . Pancreatitis Brother   . Anemia Neg Hx   . Arrhythmia Neg Hx   . Asthma Neg Hx   . Clotting disorder Neg Hx   . Fainting Neg Hx   . Heart attack Neg Hx   . Heart disease Neg Hx   . Heart failure Neg Hx   . Hyperlipidemia Neg Hx       Review of systems complete and found to be negative unless listed above      PHYSICAL EXAM  General: Well developed, well nourished, in no acute distress HEENT:  Normocephalic and atramatic Neck:  No JVD.  Lungs: Clear bilaterally to auscultation and percussion. Heart: Irregular irregular normal S1 and S2  without gallops or murmurs.  Abdomen: Bowel sounds are positive, abdomen soft and non-tender  Msk:  Back normal, normal gait. Normal strength and tone for age. Extremities: No clubbing, cyanosis or edema.   Neuro: Alert and oriented X 3. Psych:  Good affect, responds appropriately  Labs:   Lab Results  Component Value Date   WBC 5.6 11/24/2020   HGB 13.6 11/24/2020   HCT 40.3 11/24/2020   MCV 98.3 11/24/2020   PLT 138 (L) 11/24/2020    Recent Labs  Lab 11/23/20 0806 11/24/20 0633  NA 134* 136  K 3.8 3.6  CL 96* 97*  CO2 29 30  BUN 41* 36*  CREATININE 1.97* 1.84*  CALCIUM 8.7* 8.7*  PROT 7.7  --   BILITOT 1.0  --   ALKPHOS 253*  --   ALT 45*  --   AST 37  --   GLUCOSE 218* 173*   Lab Results  Component Value Date   CKTOTAL 257 (H) 12/19/2012   CKMB 2.0 08/11/2014   TROPONINI 0.07 (HH) 02/04/2018    Lab Results  Component Value Date   CHOL 247 (H) 09/12/2016   CHOL 234 (H) 07/07/2016   CHOL 272 (A) 10/18/2015   Lab Results  Component Value Date   HDL 31 (L) 09/12/2016   HDL 31 (L) 07/07/2016   HDL 34 (A) 10/18/2015   Lab Results  Component Value Date   LDLCALC 177 (H) 09/12/2016   LDLCALC 179 (H) 07/07/2016   LDLCALC 215 10/18/2015   Lab Results  Component Value Date   TRIG 157 (H) 05/19/2017   TRIG 193 (H) 09/12/2016   TRIG 121 07/07/2016   Lab Results  Component Value Date   CHOLHDL 8.0 09/12/2016   CHOLHDL 7.5 07/07/2016   CHOLHDL 8.4 12/10/2014   No results found for: LDLDIRECT    Radiology: DG Chest 2 View  Result Date: 11/23/2020 CLINICAL DATA:  Shortness of breath EXAM: CHEST - 2 VIEW COMPARISON:  03/28/2020 FINDINGS: Cardiomegaly. New interstitial prominence diffusely. Dual-chamber ICD/pacer leads in unchanged position. No effusion or pneumothorax seen. IMPRESSION: CHF pattern. Electronically Signed   By: Monte Fantasia M.D.   On: 11/23/2020 08:45   ECHOCARDIOGRAM COMPLETE  Result Date: 11/23/2020    ECHOCARDIOGRAM REPORT   Patient  Name:   Oscar [name] Date of Exam: 11/23/2020 Medical Rec #:  [id number]        Height:       66.0 in Accession #:    [id number]       Weight:       308.6 lb Date of Birth:  [birth year]       BSA:          2.405 m Patient Age:    5 years         BP:           113/77 mmHg Patient Gender: M                HR:           112 bpm. Exam Location:  [facility] Procedure: 2D Echo, Color Doppler, Cardiac Doppler and Intracardiac            Opacification Agent Indications:     Dyspnea R06.00  History:         Patient has prior history of Echocardiogram examinations. CHF,                  Defibrillator; Risk Factors:Sleep Apnea, Hypertension, Diabetes                  and Dyslipidemia.  Sonographer:     Charmayne Sheer RDCS (AE) Referring Phys:  [id number] AMY N [facility] Diagnosing Phys: Yolonda Kida MD  Sonographer Comments: Technically difficult study due to poor echo windows. Image acquisition challenging due to patient body habitus. IMPRESSIONS  1. Severely depressed LVF.  2. Left ventricular ejection fraction, by estimation, is <20%. The left ventricle has severely decreased function. The left ventricle demonstrates global hypokinesis. The left ventricular internal cavity size was severely dilated. There is moderate left  ventricular hypertrophy. Left ventricular diastolic parameters are consistent with Grade I diastolic dysfunction (impaired relaxation).  3. Right ventricular systolic function is moderately reduced. The right ventricular size is moderately enlarged. Mildly increased right ventricular wall thickness. There is moderately elevated pulmonary artery systolic pressure.  4. Left atrial size was mild to moderately dilated.  5. Right atrial size was mild to moderately dilated.  6. The mitral valve is grossly normal. Mild mitral valve regurgitation.  7. Tricuspid valve regurgitation is moderate to severe.  8. The aortic valve is grossly normal. Aortic valve regurgitation is trivial. Mild aortic valve sclerosis is present,  with no evidence of aortic valve stenosis. FINDINGS  Left Ventricle: Left ventricular ejection fraction, by estimation, is <20%. The left ventricle has severely decreased function. The left ventricle demonstrates global hypokinesis. Definity contrast agent was given IV to delineate the left ventricular endocardial borders. The left ventricular internal cavity size was severely dilated. There is moderate left ventricular hypertrophy. Left ventricular diastolic parameters are consistent with Grade I diastolic dysfunction (impaired relaxation). Right Ventricle: The right ventricular size is moderately enlarged. Mildly increased right ventricular wall thickness. Right ventricular systolic function is moderately reduced. There is moderately elevated pulmonary artery systolic pressure. Left Atrium: Left atrial size was mild to moderately dilated. Right Atrium: Right atrial size was mild to moderately dilated. Pericardium: There is no evidence of pericardial effusion. Mitral Valve: The mitral valve is grossly normal. Mild mitral valve regurgitation. MV peak gradient, 8.1 mmHg. The mean mitral valve gradient is 4.0 mmHg. Tricuspid Valve: The tricuspid valve is grossly normal. Tricuspid valve regurgitation is moderate to severe. Aortic Valve: The aortic valve is grossly normal. Aortic valve regurgitation is trivial. Mild aortic valve sclerosis is present, with no evidence of aortic valve stenosis. Aortic valve mean gradient measures 4.0 mmHg. Aortic valve peak gradient measures 8.0 mmHg. Aortic valve area, by VTI measures 3.12 cm. Pulmonic Valve: The pulmonic valve was normal in structure. Pulmonic valve regurgitation is not visualized. Aorta: The ascending aorta was not well visualized. IAS/Shunts: The interatrial septum was not assessed. Additional Comments: Severely depressed LVF. A device lead is visualized.  LEFT VENTRICLE PLAX 2D LVIDd:         7.90 cm      Diastology LVIDs:         5.90 cm      LV e' lateral:   5.22  cm/s LV PW:         1.60 cm      LV E/e' lateral: 21.6 LV IVS:        1.40 cm LVOT diam:     3.10 cm LV SV:         75 LV SV Index:   31 LVOT Area:     7.55 cm  LV Volumes (MOD) LV vol d, MOD A2C: 452.0 ml LV vol d, MOD A4C: 347.0 ml LV vol s, MOD A2C: 366.0 ml LV vol s, MOD A4C: 338.0 ml LV SV MOD A2C:     86.0 ml LV SV MOD A4C:     347.0 ml LV SV MOD BP:      53.5 ml RIGHT VENTRICLE RV Basal diam:  3.50 cm LEFT ATRIUM              Index       RIGHT ATRIUM           Index LA diam:        6.30 cm  2.62 cm/m  RA Area:  17.00 cm LA Vol (A2C):   91.5 ml  38.05 ml/m RA Volume:   43.20 ml  17.96 ml/m LA Vol (A4C):   146.0 ml 60.71 ml/m LA Biplane Vol: 117.0 ml 48.65 ml/m  AORTIC VALVE                   PULMONIC VALVE AV Area (Vmax):    3.46 cm    PV Vmax:       0.91 m/s AV Area (Vmean):   3.69 cm    PV Vmean:      62.100 cm/s AV Area (VTI):     3.12 cm    PV VTI:        0.172 m AV Vmax:           141.00 cm/s PV Peak grad:  3.3 mmHg AV Vmean:          94.000 cm/s PV Mean grad:  2.0 mmHg AV VTI:            0.242 m AV Peak Grad:      8.0 mmHg AV Mean Grad:      4.0 mmHg LVOT Vmax:         64.60 cm/s LVOT Vmean:        45.900 cm/s LVOT VTI:          0.100 m LVOT/AV VTI ratio: 0.41  AORTA Ao Root diam: 4.00 cm MITRAL VALVE MV Area (PHT): 7.47 cm     SHUNTS MV Area VTI:   4.49 cm     Systemic VTI:  0.10 m MV Peak grad:  8.1 mmHg     Systemic Diam: 3.10 cm MV Mean grad:  4.0 mmHg MV Vmax:       1.42 m/s MV Vmean:      86.9 cm/s MV Decel Time: 102 msec MV E velocity: 113.00 cm/s Yolonda Kida MD Electronically signed by Yolonda Kida MD Signature Date/Time: 11/23/2020/4:13:15 PM    Final    CT Angio Chest/Abd/Pel for Dissection W and/or Wo Contrast  Addendum Date: 11/23/2020   ADDENDUM REPORT: 11/23/2020 09:54 ADDENDUM: Add to addendum of chest CT angiogram: Enlargement of the main pulmonary outflow tract, a finding indicative of pulmonary arterial hypertension. Electronically Signed   By: Lowella Grip III M.D.   On: 11/23/2020 09:54   Result Date: 11/23/2020 CLINICAL DATA:  Chest and abdominal pain radiating toward back. Shortness of breath EXAM: CT ANGIOGRAPHY CHEST, ABDOMEN AND PELVIS TECHNIQUE: Non-contrast CT of the chest was initially obtained. Multidetector CT imaging through the chest, abdomen and pelvis was performed using the standard protocol during bolus administration of intravenous contrast. Multiplanar reconstructed images and MIPs were obtained and reviewed to evaluate the vascular anatomy. CONTRAST:  167mL OMNIPAQUE IOHEXOL 350 MG/ML SOLN COMPARISON:  Chest radiograph November 23, 2020; CT abdomen and pelvis March 28, 2020. Chest radiograph May 28, 2017 FINDINGS: CTA CHEST FINDINGS Cardiovascular: No evident intramural hematoma in the thoracic aorta on noncontrast enhanced study. There is no evident mediastinal hematoma. There is no appreciable thoracic aortic aneurysm or dissection. Visualized great vessels appear unremarkable. Note that the right innominate and left common carotid arteries arise as a common trunk, an anatomic variant. There is no appreciable aneurysm or dissection involving the visualized great vessels. No appreciable atherosclerotic plaque noted in the visualized great vessels. Rather minimal atherosclerotic calcification noted in the aorta. No evidence of aortic ulceration in the thoracic region. No appreciable pulmonary embolus. Main pulmonary outflow tract measures 4.0  cm, enlarged. Heart is enlarged with pacemaker leads attached to the right atrium and right ventricle. No pericardial effusion or pericardial thickening. Mediastinum/Nodes: Visualized thyroid appears unremarkable. There is lymph node enlargement slightly to the right of the lower trachea measuring 1.5 x 1.5 cm. A lymph node in the aortopulmonary window region measures 1.2 x 1.1 cm. There are scattered subcentimeter mediastinal lymph nodes as well. No esophageal lesions are evident. Lungs/Pleura:  Scattered bullae are noted in the apices as well as throughout portions of the right lower lobe. There is no edema or consolidation. No pleural effusions. There are areas of mosaic attenuation in the lower lobe regions bilaterally. No pneumothorax. Trachea and major bronchial structures appear normal. Musculoskeletal: No acute appearing fracture or dislocation. There is mild anterior wedging of the T8, T9, and T10 vertebral bodies, stable. No blastic or lytic bone lesions. No evident chest wall lesions. Review of the MIP images confirms the above findings. CTA ABDOMEN AND PELVIS FINDINGS VASCULAR Aorta: There is no abdominal aortic aneurysm or dissection. There is modest plaque in the distal aorta, not causing hemodynamically significant obstruction. Celiac: Celiac artery and its branches are widely patent. No aneurysm or dissection involving these vessels. SMA: Superior mesenteric artery and its branches are widely patent. No aneurysm or dissection involving these vessels. Renals: There are 2 renal arteries on each side. There is no appreciable aneurysm or dissection involving renal arteries or respective branches. No fibromuscular dysplasia. No appreciable atherosclerotic irregularity in these vessels. IMA: Inferior mesenteric artery and its branches are widely patent. No aneurysm or dissection involving these vessels. Inflow: Minimal atherosclerotic calcification is noted at the origins of each common iliac artery. There is also calcification in the proximal left internal iliac artery. Other major arterial pelvic vessels are widely patent. No aneurysm or dissection involving pelvic arterial vessels noted. Visualized profunda femoral and superficial femoral arteries are patent without aneurysm or dissection involving these vessels. Veins: No obvious venous abnormality within the limitations of this arterial phase study. Review of the MIP images confirms the above findings. NON-VASCULAR Hepatobiliary: Liver  measures 20.9 cm in length. Subtle irregularity along the liver capsule again noted. No focal liver lesions are appreciable. There is questionable thickening of the gallbladder wall. No evident biliary duct dilatation. Pancreas: No evident pancreatic mass or inflammatory focus. Spleen: No splenic lesions are appreciable. Adrenals/Urinary Tract: Adrenals bilaterally appear normal. No evident renal mass or hydronephrosis on either side. There is no appreciable renal or ureteral calculus on either side. Urinary bladder is midline with wall thickness within normal limits. Stomach/Bowel: There is no appreciable bowel wall or mesenteric thickening. The terminal ileum appears normal. There is no evident bowel obstruction. Appendix appears normal. No evident free air or portal venous air. Lymphatic: There are subcentimeter inguinal lymph nodes bilaterally, considered nonspecific and stable. There is a lymph node anterior to the inferior vena cava in the periportal region with a short axis diameter of 1.4 cm, stable. Subcentimeter retroperitoneal lymph nodes are stable. No new lymph node enlargement evident. Reproductive: Prostate prominent but stable. Seminal vesicles appear normal. Prostate abuts the inferior aspect of the bladder, a stable finding. Other: No evident abscess or ascites in the abdomen or pelvis. There is a focal umbilical hernia containing fat but no bowel, unchanged from prior study. Musculoskeletal: Degenerative changes noted in the lumbar spine. There are no blastic or lytic bone lesions. No intramuscular lesions evident. Review of the MIP images confirms the above findings. IMPRESSION: CT angiogram chest: 1. No thoracic  aortic aneurysm or dissection. Minimal aortic atherosclerosis. Visualized great vessels appear unremarkable. 2. Scattered areas of bullous disease, more severe on the right than on the left. Scattered areas of mosaic attenuation in the lower lobes likely represents underlying small  airways obstructive disease. No edema or consolidation. No pleural effusions. 3. Prominent inferior pretracheal and aortopulmonary window lymph nodes of uncertain etiology. 4. Pacemaker leads attached to right heart. There is cardiomegaly. No pericardial effusion. 5.  No evident pulmonary embolus. CT angiogram abdomen; CT angiogram pelvis: 1. Mild atherosclerotic plaque in the aorta and proximal common iliac arteries. There is also atherosclerotic plaque in the internal iliac artery. No hemodynamically significant obstruction evident in the aorta, major mesenteric, and major pelvic arterial vessels. 2. No aneurysm or dissection involving the aorta, major mesenteric, and major pelvic arterial vessels. No fibromuscular dysplasia. 3. The appearance of the liver suggests a degree of cirrhosis. A prominent pericaval lymph node is stable and may be secondary to underlying hepatic cirrhosis. 4. Question a degree of gallbladder wall thickening. This finding may warrant ultrasound of the gallbladder to further assess. 5. No evident bowel wall thickening or bowel obstruction. No abscess in the abdomen or pelvis. Appendix unremarkable. 6. No renal or ureteral calculus. No hydronephrosis. Urinary bladder wall thickness normal. 7. Prominent prostate, stable. This finding may warrant correlation with PSA. 8.  Stable umbilical hernia containing fat but no bowel. Aortic Atherosclerosis (ICD10-I70.0). Electronically Signed: By: Lowella Grip III M.D. 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 Plan Agree with admit to telemetry Follow-up troponins EKGs Recommend rate control  for atrial fibrillation Recommend sleep study CPAP if indicated weight loss follow-up with pulmonary Advised patient to refrain from tobacco abuse Continue diabetes management and control with Jardiance and insulin Recommend Lasix 80 mg twice a day Increase Entresto to 97/103 twice a day Repeat echocardiogram for assessment of left ventricular function and valvular structures Supplemental oxygen inhalers for possible COPD Continue amiodarone and Xarelto for atrial fibrillation  continue Lipitor statin therapy for hyperlipidemia Renal insufficiency stage III have the patient follow-up with nephrology Correct electrolytes   Signed: Yolonda Kida MD 11/24/2020, 2:44 PM

## 2020-11-24 NOTE — Progress Notes (Signed)
PROGRESS NOTE    DEMETRUIS [name]  AJG:[id number] DOB: Jan 06, 1976 DOA: 11/23/2020 PCP: [facility] [place]   Chief complaint.  Shortness of breath. Brief Narrative:  Oscar [name] is a 45 y.o. male with medical history significant for heart failure with reduced ejection fraction, obesity, hypertension, status post dual-lead ICD, new onset atrial fibrillation, presents to the emergency department for chief concerns of worsening shortness of breath. Echocardiogram performed 11/23/2020 showed ejection fraction less than 35% with diastolic dysfunction.   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  Acute on chronic combined systolic and diastolic congestive heart failure. Elevated troponin secondary to congestive heart failure exacerbation. Reviewed echocardiogram performed yesterday, showed ejection fraction less than 59%, diastolic dysfunction.  Patient appears to have significant volume overload, continue IV Lasix.  Also consult cardiology.  #2.  Chronic hypoxemic respiratory failure. Obstructive sleep apnea. Morbid obesity. Obesity hypoventilation syndrome. Patient will be treated with oxygen, continue CPAP while asleep.  #3.  Paroxysmal atrial fibrillation with RVR. Heart rate much better today.  Continue amiodarone and Xarelto.  4.  Acute kidney injury. Continue to follow.  #5.  Hypomagnesemia. Continue  to follow.  #6.  Mild thrombocytopenia. Follow.  He is not on any heparin products.     DVT prophylaxis: Xarelto Code Status: Full Family Communication:  Disposition Plan:  .   Status is: Observation  The patient will require care spanning > 2 midnights and should be moved to inpatient because: Inpatient level of care appropriate due to severity of illness  Dispo: The patient is from: Home              Anticipated d/c is to: Home              Patient currently is not medically stable to d/c.   Difficult to place patient No        I/O last 3 completed shifts: In: 1024.8 [I.V.:24.8; IV Piggyback:1000] Out: 735 [Urine:735] No intake/output data recorded.     Consultants:   Cardiology  Procedures: None  Antimicrobials: None  Subjective: Patient still has significant short of breath with exertion.  He had paroxysmal dyspnea last night.  He has a mild cough with clear mucus. He does not have any fever or chills. No dysuria hematuria No abdominal pain or nausea vomiting.  Objective: Vitals:   11/24/20 0328 11/24/20 0402 11/24/20 0753 11/24/20 0801  BP: 114/63  107/90   Pulse: 87  (!) 46 92  Resp: 18 20 18    Temp: 98 F (36.7 C)  (!) 97.3 F (36.3 C)   TempSrc:   Oral   SpO2: 95%  96%   Weight: (!) 141.4 kg     Height:        Intake/Output Summary (Last 24 hours) at 11/24/2020 0950 Last data filed at 11/24/2020 0300 Gross per 24 hour  Intake 1024.82 ml  Output 735 ml  Net 289.82 ml   [employer]  11/23/20 0802 11/23/20 1502 11/24/20 0328  Weight: (!) 140 kg (!) 140.6 kg (!) 141.4 kg    Examination:  General exam: Appears calm and comfortable, severely morbid obese. Respiratory system: Significant decreased breathing sounds. Respiratory effort normal. Cardiovascular system: Irregular, no JVD, murmurs, rubs, gallops or clicks.  Gastrointestinal system: Abdomen is nondistended, soft and nontender. No organomegaly or masses felt. Normal bowel  sounds heard. Central nervous system: Alert and oriented. No focal neurological deficits. Extremities: 2+ leg edema Skin: No rashes, lesions or ulcers Psychiatry: Judgement and insight appear normal. Mood & affect appropriate.     Data Reviewed: I have personally reviewed following labs and imaging studies  CBC: Recent Labs  Lab 11/23/20 0806 11/24/20 0633  WBC 5.7 5.6  NEUTROABS  --  3.0  HGB 14.2 13.6  HCT 41.6 40.3  MCV 96.7 98.3  PLT 141* 448*   Basic Metabolic Panel: Recent Labs  Lab 11/23/20 0806 11/24/20 0633  NA 134* 136  K 3.8 3.6  CL 96* 97*  CO2 29 30  GLUCOSE 218* 173*  BUN 41* 36*  CREATININE 1.97* 1.84*  CALCIUM 8.7* 8.7*  MG 1.4* 1.8   GFR: Estimated Creatinine Clearance: 68 mL/min (A) (by C-G formula based on SCr of 1.84 mg/dL (H)). Liver Function Tests: Recent Labs  Lab 11/23/20 0806  AST 37  ALT 45*  ALKPHOS 253*  BILITOT 1.0  PROT 7.7  ALBUMIN 3.5   Recent Labs  Lab 11/23/20 0806  LIPASE 47   No results for input(s): AMMONIA in the last 168 hours. Coagulation Profile: No results for input(s): INR, PROTIME in the last 168 hours. Cardiac Enzymes: No results for input(s): CKTOTAL, CKMB, CKMBINDEX, TROPONINI in the last 168 hours. BNP (last 3 results) No results for input(s): PROBNP in the last 8760 hours. HbA1C: No results for input(s): HGBA1C in the last 72 hours. CBG: No results for input(s): GLUCAP in the last 168 hours. Lipid Profile: No results for input(s): CHOL, HDL, LDLCALC, TRIG, CHOLHDL, LDLDIRECT in the last 72 hours. Thyroid Function Tests: Recent Labs    11/24/20 0633  TSH 2.250   Anemia Panel: No results for input(s): VITAMINB12, FOLATE, FERRITIN, TIBC, IRON, RETICCTPCT in the last 72 hours. Sepsis Labs: No results for input(s): PROCALCITON, LATICACIDVEN in the last 168 hours.  Recent Results (from the past 240 hour(s))  Resp Panel by RT-PCR (Flu A&B, Covid) Nasopharyngeal Swab     Status: None   Collection  Time: 11/23/20  9:30 AM   Specimen: Nasopharyngeal Swab; Nasopharyngeal(NP) swabs in vial transport medium  Result Value Ref Range Status   SARS Coronavirus 2 by RT PCR NEGATIVE NEGATIVE Final    Comment: (NOTE) SARS-CoV-2 target nucleic acids are NOT DETECTED.  The SARS-CoV-2 RNA is generally detectable in upper respiratory specimens during the acute phase of infection. The lowest concentration of SARS-CoV-2 viral copies this assay can detect is 138 copies/mL. A negative result does not preclude SARS-Cov-2 infection and should not be used as the sole basis for treatment or other patient management decisions. A negative result may occur with  improper specimen collection/handling, submission of specimen other than nasopharyngeal swab, presence of viral mutation(s) within the areas targeted by this assay, and inadequate number of viral copies(<138 copies/mL). A negative result must be combined with clinical observations, patient history, and epidemiological information. The expected result is Negative.  Fact Sheet for Patients:  EntrepreneurPulse.com.au  Fact Sheet for Healthcare Providers:  IncredibleEmployment.be  This test is no t yet approved or  cleared by the [place] and  has been authorized for detection and/or diagnosis of SARS-CoV-2 by FDA under an Emergency Use Authorization (EUA). This EUA will remain  in effect (meaning this test can be used) for the duration of the COVID-19 declaration under Section 564(b)(1) of the Act, 21 U.S.C.section 360bbb-3(b)(1), unless the authorization is terminated  or revoked sooner.       Influenza A by PCR NEGATIVE NEGATIVE Final   Influenza B by PCR NEGATIVE NEGATIVE Final    Comment: (NOTE) The Xpert Xpress SARS-CoV-2/FLU/RSV plus assay is intended as an aid in the diagnosis of influenza from Nasopharyngeal swab specimens and should not be used as a sole basis for treatment. Nasal washings  and aspirates are unacceptable for Xpert Xpress SARS-CoV-2/FLU/RSV testing.  Fact Sheet for Patients: EntrepreneurPulse.com.au  Fact Sheet for Healthcare Providers: IncredibleEmployment.be  This test is not yet approved or cleared by the [place] FDA and has been authorized for detection and/or diagnosis of SARS-CoV-2 by FDA under an Emergency Use Authorization (EUA). This EUA will remain in effect (meaning this test can be used) for the duration of the COVID-19 declaration under Section 564(b)(1) of the Act, 21 U.S.C. section 360bbb-3(b)(1), unless the authorization is terminated or revoked.  Performed at [facility], [address], [place], [place] [address]          Radiology Studies: DG Chest 2 View  Result Date: 11/23/2020 CLINICAL DATA:  Shortness of breath EXAM: CHEST - 2 VIEW COMPARISON:  03/28/2020 FINDINGS: Cardiomegaly. New interstitial prominence diffusely. Dual-chamber ICD/pacer leads in unchanged position. No effusion or pneumothorax seen. IMPRESSION: CHF pattern. Electronically Signed   By: Monte Fantasia M.D.   On: 11/23/2020 08:45   ECHOCARDIOGRAM COMPLETE  Result Date: 11/23/2020    ECHOCARDIOGRAM REPORT   Patient Name:   Oscar [name] Date of Exam: 11/23/2020 Medical Rec #:  [id number]        Height:       66.0 in Accession #:    [id number]       Weight:       308.6 lb Date of Birth:  [birth year]       BSA:          2.405 m Patient Age:    78 years         BP:           113/77 mmHg Patient Gender: M                HR:           112 bpm. Exam Location:  [facility] Procedure: 2D Echo, Color Doppler, Cardiac Doppler and Intracardiac            Opacification Agent Indications:     Dyspnea R06.00  History:         Patient has prior history of Echocardiogram examinations. CHF,                  Defibrillator; Risk Factors:Sleep Apnea, Hypertension, Diabetes                  and Dyslipidemia.  Sonographer:     Charmayne Sheer  RDCS (AE) Referring Phys:  [id number] AMY N COX Diagnosing Phys: Yolonda Kida MD  Sonographer Comments: Technically difficult study due to poor echo windows. Image acquisition challenging due to patient body habitus. IMPRESSIONS  1. Severely depressed LVF.  2. Left ventricular ejection fraction, by estimation, is <20%. The left ventricle has  severely decreased function. The left ventricle demonstrates global hypokinesis. The left ventricular internal cavity size was severely dilated. There is moderate left  ventricular hypertrophy. Left ventricular diastolic parameters are consistent with Grade I diastolic dysfunction (impaired relaxation).  3. Right ventricular systolic function is moderately reduced. The right ventricular size is moderately enlarged. Mildly increased right ventricular wall thickness. There is moderately elevated pulmonary artery systolic pressure.  4. Left atrial size was mild to moderately dilated.  5. Right atrial size was mild to moderately dilated.  6. The mitral valve is grossly normal. Mild mitral valve regurgitation.  7. Tricuspid valve regurgitation is moderate to severe.  8. The aortic valve is grossly normal. Aortic valve regurgitation is trivial. Mild aortic valve sclerosis is present, with no evidence of aortic valve stenosis. FINDINGS  Left Ventricle: Left ventricular ejection fraction, by estimation, is <20%. The left ventricle has severely decreased function. The left ventricle demonstrates global hypokinesis. Definity contrast agent was given IV to delineate the left ventricular endocardial borders. The left ventricular internal cavity size was severely dilated. There is moderate left ventricular hypertrophy. Left ventricular diastolic parameters are consistent with Grade I diastolic dysfunction (impaired relaxation). Right Ventricle: The right ventricular size is moderately enlarged. Mildly increased right ventricular wall thickness. Right ventricular systolic function is  moderately reduced. There is moderately elevated pulmonary artery systolic pressure. Left Atrium: Left atrial size was mild to moderately dilated. Right Atrium: Right atrial size was mild to moderately dilated. Pericardium: There is no evidence of pericardial effusion. Mitral Valve: The mitral valve is grossly normal. Mild mitral valve regurgitation. MV peak gradient, 8.1 mmHg. The mean mitral valve gradient is 4.0 mmHg. Tricuspid Valve: The tricuspid valve is grossly normal. Tricuspid valve regurgitation is moderate to severe. Aortic Valve: The aortic valve is grossly normal. Aortic valve regurgitation is trivial. Mild aortic valve sclerosis is present, with no evidence of aortic valve stenosis. Aortic valve mean gradient measures 4.0 mmHg. Aortic valve peak gradient measures 8.0 mmHg. Aortic valve area, by VTI measures 3.12 cm. Pulmonic Valve: The pulmonic valve was normal in structure. Pulmonic valve regurgitation is not visualized. Aorta: The ascending aorta was not well visualized. IAS/Shunts: The interatrial septum was not assessed. Additional Comments: Severely depressed LVF. A device lead is visualized.  LEFT VENTRICLE PLAX 2D LVIDd:         7.90 cm      Diastology LVIDs:         5.90 cm      LV e' lateral:   5.22 cm/s LV PW:         1.60 cm      LV E/e' lateral: 21.6 LV IVS:        1.40 cm LVOT diam:     3.10 cm LV SV:         75 LV SV Index:   31 LVOT Area:     7.55 cm  LV Volumes (MOD) LV vol d, MOD A2C: 452.0 ml LV vol d, MOD A4C: 347.0 ml LV vol s, MOD A2C: 366.0 ml LV vol s, MOD A4C: 338.0 ml LV SV MOD A2C:     86.0 ml LV SV MOD A4C:     347.0 ml LV SV MOD BP:      53.5 ml RIGHT VENTRICLE RV Basal diam:  3.50 cm LEFT ATRIUM              Index       RIGHT ATRIUM  Index LA diam:        6.30 cm  2.62 cm/m  RA Area:     17.00 cm LA Vol (A2C):   91.5 ml  38.05 ml/m RA Volume:   43.20 ml  17.96 ml/m LA Vol (A4C):   146.0 ml 60.71 ml/m LA Biplane Vol: 117.0 ml 48.65 ml/m  AORTIC VALVE                    PULMONIC VALVE AV Area (Vmax):    3.46 cm    PV Vmax:       0.91 m/s AV Area (Vmean):   3.69 cm    PV Vmean:      62.100 cm/s AV Area (VTI):     3.12 cm    PV VTI:        0.172 m AV Vmax:           141.00 cm/s PV Peak grad:  3.3 mmHg AV Vmean:          94.000 cm/s PV Mean grad:  2.0 mmHg AV VTI:            0.242 m AV Peak Grad:      8.0 mmHg AV Mean Grad:      4.0 mmHg LVOT Vmax:         64.60 cm/s LVOT Vmean:        45.900 cm/s LVOT VTI:          0.100 m LVOT/AV VTI ratio: 0.41  AORTA Ao Root diam: 4.00 cm MITRAL VALVE MV Area (PHT): 7.47 cm     SHUNTS MV Area VTI:   4.49 cm     Systemic VTI:  0.10 m MV Peak grad:  8.1 mmHg     Systemic Diam: 3.10 cm MV Mean grad:  4.0 mmHg MV Vmax:       1.42 m/s MV Vmean:      86.9 cm/s MV Decel Time: 102 msec MV E velocity: 113.00 cm/s Yolonda Kida MD Electronically signed by Yolonda Kida MD Signature Date/Time: 11/23/2020/4:13:15 PM    Final    CT Angio Chest/Abd/Pel for Dissection W and/or Wo Contrast  Addendum Date: 11/23/2020   ADDENDUM REPORT: 11/23/2020 09:54 ADDENDUM: Add to addendum of chest CT angiogram: Enlargement of the main pulmonary outflow tract, a finding indicative of pulmonary arterial hypertension. Electronically Signed   By: Lowella Grip III M.D.   On: 11/23/2020 09:54   Result Date: 11/23/2020 CLINICAL DATA:  Chest and abdominal pain radiating toward back. Shortness of breath EXAM: CT ANGIOGRAPHY CHEST, ABDOMEN AND PELVIS TECHNIQUE: Non-contrast CT of the chest was initially obtained. Multidetector CT imaging through the chest, abdomen and pelvis was performed using the standard protocol during bolus administration of intravenous contrast. Multiplanar reconstructed images and MIPs were obtained and reviewed to evaluate the vascular anatomy. CONTRAST:  136mL OMNIPAQUE IOHEXOL 350 MG/ML SOLN COMPARISON:  Chest radiograph November 23, 2020; CT abdomen and pelvis March 28, 2020. Chest radiograph May 28, 2017 FINDINGS: CTA  CHEST FINDINGS Cardiovascular: No evident intramural hematoma in the thoracic aorta on noncontrast enhanced study. There is no evident mediastinal hematoma. There is no appreciable thoracic aortic aneurysm or dissection. Visualized great vessels appear unremarkable. Note that the right innominate and left common carotid arteries arise as a common trunk, an anatomic variant. There is no appreciable aneurysm or dissection involving the visualized great vessels. No appreciable atherosclerotic plaque noted in the visualized great vessels. Rather minimal atherosclerotic calcification  noted in the aorta. No evidence of aortic ulceration in the thoracic region. No appreciable pulmonary embolus. Main pulmonary outflow tract measures 4.0 cm, enlarged. Heart is enlarged with pacemaker leads attached to the right atrium and right ventricle. No pericardial effusion or pericardial thickening. Mediastinum/Nodes: Visualized thyroid appears unremarkable. There is lymph node enlargement slightly to the right of the lower trachea measuring 1.5 x 1.5 cm. A lymph node in the aortopulmonary window region measures 1.2 x 1.1 cm. There are scattered subcentimeter mediastinal lymph nodes as well. No esophageal lesions are evident. Lungs/Pleura: Scattered bullae are noted in the apices as well as throughout portions of the right lower lobe. There is no edema or consolidation. No pleural effusions. There are areas of mosaic attenuation in the lower lobe regions bilaterally. No pneumothorax. Trachea and major bronchial structures appear normal. Musculoskeletal: No acute appearing fracture or dislocation. There is mild anterior wedging of the T8, T9, and T10 vertebral bodies, stable. No blastic or lytic bone lesions. No evident chest wall lesions. Review of the MIP images confirms the above findings. CTA ABDOMEN AND PELVIS FINDINGS VASCULAR Aorta: There is no abdominal aortic aneurysm or dissection. There is modest plaque in the distal aorta,  not causing hemodynamically significant obstruction. Celiac: Celiac artery and its branches are widely patent. No aneurysm or dissection involving these vessels. SMA: Superior mesenteric artery and its branches are widely patent. No aneurysm or dissection involving these vessels. Renals: There are 2 renal arteries on each side. There is no appreciable aneurysm or dissection involving renal arteries or respective branches. No fibromuscular dysplasia. No appreciable atherosclerotic irregularity in these vessels. IMA: Inferior mesenteric artery and its branches are widely patent. No aneurysm or dissection involving these vessels. Inflow: Minimal atherosclerotic calcification is noted at the origins of each common iliac artery. There is also calcification in the proximal left internal iliac artery. Other major arterial pelvic vessels are widely patent. No aneurysm or dissection involving pelvic arterial vessels noted. Visualized profunda femoral and superficial femoral arteries are patent without aneurysm or dissection involving these vessels. Veins: No obvious venous abnormality within the limitations of this arterial phase study. Review of the MIP images confirms the above findings. NON-VASCULAR Hepatobiliary: Liver measures 20.9 cm in length. Subtle irregularity along the liver capsule again noted. No focal liver lesions are appreciable. There is questionable thickening of the gallbladder wall. No evident biliary duct dilatation. Pancreas: No evident pancreatic mass or inflammatory focus. Spleen: No splenic lesions are appreciable. Adrenals/Urinary Tract: Adrenals bilaterally appear normal. No evident renal mass or hydronephrosis on either side. There is no appreciable renal or ureteral calculus on either side. Urinary bladder is midline with wall thickness within normal limits. Stomach/Bowel: There is no appreciable bowel wall or mesenteric thickening. The terminal ileum appears normal. There is no evident bowel  obstruction. Appendix appears normal. No evident free air or portal venous air. Lymphatic: There are subcentimeter inguinal lymph nodes bilaterally, considered nonspecific and stable. There is a lymph node anterior to the inferior vena cava in the periportal region with a short axis diameter of 1.4 cm, stable. Subcentimeter retroperitoneal lymph nodes are stable. No new lymph node enlargement evident. Reproductive: Prostate prominent but stable. Seminal vesicles appear normal. Prostate abuts the inferior aspect of the bladder, a stable finding. Other: No evident abscess or ascites in the abdomen or pelvis. There is a focal umbilical hernia containing fat but no bowel, unchanged from prior study. Musculoskeletal: Degenerative changes noted in the lumbar spine. There are no blastic or  lytic bone lesions. No intramuscular lesions evident. Review of the MIP images confirms the above findings. IMPRESSION: CT angiogram chest: 1. No thoracic aortic aneurysm or dissection. Minimal aortic atherosclerosis. Visualized great vessels appear unremarkable. 2. Scattered areas of bullous disease, more severe on the right than on the left. Scattered areas of mosaic attenuation in the lower lobes likely represents underlying small airways obstructive disease. No edema or consolidation. No pleural effusions. 3. Prominent inferior pretracheal and aortopulmonary window lymph nodes of uncertain etiology. 4. Pacemaker leads attached to right heart. There is cardiomegaly. No pericardial effusion. 5.  No evident pulmonary embolus. CT angiogram abdomen; CT angiogram pelvis: 1. Mild atherosclerotic plaque in the aorta and proximal common iliac arteries. There is also atherosclerotic plaque in the internal iliac artery. No hemodynamically significant obstruction evident in the aorta, major mesenteric, and major pelvic arterial vessels. 2. No aneurysm or dissection involving the aorta, major mesenteric, and major pelvic arterial vessels. No  fibromuscular dysplasia. 3. The appearance of the liver suggests a degree of cirrhosis. A prominent pericaval lymph node is stable and may be secondary to underlying hepatic cirrhosis. 4. Question a degree of gallbladder wall thickening. This finding may warrant ultrasound of the gallbladder to further assess. 5. No evident bowel wall thickening or bowel obstruction. No abscess in the abdomen or pelvis. Appendix unremarkable. 6. No renal or ureteral calculus. No hydronephrosis. Urinary bladder wall thickness normal. 7. Prominent prostate, stable. This finding may warrant correlation with PSA. 8.  Stable umbilical hernia containing fat but no bowel. Aortic Atherosclerosis (ICD10-I70.0). Electronically Signed: By: Lowella Grip III M.D. On: 11/23/2020 09:47        Scheduled Meds: . amiodarone  200 mg Oral BID  . atorvastatin  40 mg Oral QHS  . empagliflozin  10 mg Oral Daily  . furosemide  60 mg Intravenous BID  . pantoprazole  80 mg Oral Daily  . rivaroxaban  20 mg Oral Q supper  . sacubitril-valsartan  1 tablet Oral BID  . sodium chloride flush  3 mL Intravenous Q12H  . tiotropium  18 mcg Inhalation Daily  . umeclidinium-vilanterol  1 puff Inhalation Q1500   Continuous Infusions: . sodium chloride    . diltiazem (CARDIZEM) infusion 5 mg/hr (11/23/20 1503)     LOS: 0 days    Time spent: 34 minutes    Sharen Hones, MD [facility]   To contact the attending provider between 7A-7P or the covering provider during after hours 7P-7A, please log into the web site [email or web address] and access using universal [place] password for that web site. If you do not have the password, please call the hospital operator.  11/24/2020, 9:50 AM

## 2020-11-24 NOTE — Plan of Care (Signed)

## 2020-11-24 NOTE — Progress Notes (Signed)
Pt c/o discomfort however, VSS. APP made aware. RT came and place bipap on pt. Pt on cont. cardizem at 37ml/hr. Will continue to monitor.

## 2020-11-25 ENCOUNTER — Inpatient Hospital Stay
Admission: EM | Admit: 2020-11-25 | Discharge: 2020-11-27 | DRG: 291 | Disposition: A | Discharge status: Home Health | Payer: Medicare Other | Attending: Internal Medicine | Admitting: Internal Medicine

## 2020-11-25 ENCOUNTER — Other Ambulatory Visit: Payer: Self-pay

## 2020-11-25 ENCOUNTER — Emergency Department: Payer: Medicare Other

## 2020-11-25 DIAGNOSIS — Z7984 Long term (current) use of oral hypoglycemic drugs: Secondary | ICD-10-CM

## 2020-11-25 DIAGNOSIS — E119 Type 2 diabetes mellitus without complications: Secondary | ICD-10-CM | POA: Diagnosis present

## 2020-11-25 DIAGNOSIS — Z950 Presence of cardiac pacemaker: Secondary | ICD-10-CM | POA: Diagnosis present

## 2020-11-25 DIAGNOSIS — I252 Old myocardial infarction: Secondary | ICD-10-CM

## 2020-11-25 DIAGNOSIS — Z20822 Contact with and (suspected) exposure to covid-19: Secondary | ICD-10-CM | POA: Diagnosis present

## 2020-11-25 DIAGNOSIS — J441 Chronic obstructive pulmonary disease with (acute) exacerbation: Secondary | ICD-10-CM | POA: Diagnosis present

## 2020-11-25 DIAGNOSIS — J449 Chronic obstructive pulmonary disease, unspecified: Secondary | ICD-10-CM | POA: Diagnosis present

## 2020-11-25 DIAGNOSIS — K219 Gastro-esophageal reflux disease without esophagitis: Secondary | ICD-10-CM | POA: Diagnosis present

## 2020-11-25 DIAGNOSIS — Z6841 Body Mass Index (BMI) 40.0 and over, adult: Secondary | ICD-10-CM

## 2020-11-25 DIAGNOSIS — Z95810 Presence of automatic (implantable) cardiac defibrillator: Secondary | ICD-10-CM

## 2020-11-25 DIAGNOSIS — Z881 Allergy status to other antibiotic agents status: Secondary | ICD-10-CM

## 2020-11-25 DIAGNOSIS — N289 Disorder of kidney and ureter, unspecified: Secondary | ICD-10-CM | POA: Diagnosis present

## 2020-11-25 DIAGNOSIS — R778 Other specified abnormalities of plasma proteins: Secondary | ICD-10-CM | POA: Diagnosis present

## 2020-11-25 DIAGNOSIS — Z7902 Long term (current) use of antithrombotics/antiplatelets: Secondary | ICD-10-CM

## 2020-11-25 DIAGNOSIS — F17210 Nicotine dependence, cigarettes, uncomplicated: Secondary | ICD-10-CM | POA: Diagnosis present

## 2020-11-25 DIAGNOSIS — I110 Hypertensive heart disease with heart failure: Secondary | ICD-10-CM | POA: Diagnosis not present

## 2020-11-25 DIAGNOSIS — J810 Acute pulmonary edema: Principal | ICD-10-CM

## 2020-11-25 DIAGNOSIS — Z833 Family history of diabetes mellitus: Secondary | ICD-10-CM

## 2020-11-25 DIAGNOSIS — J811 Chronic pulmonary edema: Secondary | ICD-10-CM | POA: Diagnosis not present

## 2020-11-25 DIAGNOSIS — I5022 Chronic systolic (congestive) heart failure: Secondary | ICD-10-CM | POA: Diagnosis present

## 2020-11-25 DIAGNOSIS — I2510 Atherosclerotic heart disease of native coronary artery without angina pectoris: Secondary | ICD-10-CM | POA: Diagnosis present

## 2020-11-25 DIAGNOSIS — J9612 Chronic respiratory failure with hypercapnia: Secondary | ICD-10-CM | POA: Diagnosis present

## 2020-11-25 DIAGNOSIS — Z7901 Long term (current) use of anticoagulants: Secondary | ICD-10-CM

## 2020-11-25 DIAGNOSIS — E872 Acidosis: Secondary | ICD-10-CM | POA: Diagnosis present

## 2020-11-25 DIAGNOSIS — Z79899 Other long term (current) drug therapy: Secondary | ICD-10-CM

## 2020-11-25 DIAGNOSIS — Z720 Tobacco use: Secondary | ICD-10-CM | POA: Diagnosis present

## 2020-11-25 DIAGNOSIS — I10 Essential (primary) hypertension: Secondary | ICD-10-CM | POA: Diagnosis present

## 2020-11-25 DIAGNOSIS — I5023 Acute on chronic systolic (congestive) heart failure: Secondary | ICD-10-CM | POA: Diagnosis present

## 2020-11-25 DIAGNOSIS — Z7951 Long term (current) use of inhaled steroids: Secondary | ICD-10-CM

## 2020-11-25 DIAGNOSIS — E662 Morbid (severe) obesity with alveolar hypoventilation: Secondary | ICD-10-CM | POA: Diagnosis present

## 2020-11-25 DIAGNOSIS — Z9981 Dependence on supplemental oxygen: Secondary | ICD-10-CM

## 2020-11-25 DIAGNOSIS — Z825 Family history of asthma and other chronic lower respiratory diseases: Secondary | ICD-10-CM

## 2020-11-25 DIAGNOSIS — R042 Hemoptysis: Secondary | ICD-10-CM | POA: Diagnosis present

## 2020-11-25 DIAGNOSIS — Z8249 Family history of ischemic heart disease and other diseases of the circulatory system: Secondary | ICD-10-CM

## 2020-11-25 DIAGNOSIS — Z8711 Personal history of peptic ulcer disease: Secondary | ICD-10-CM

## 2020-11-25 DIAGNOSIS — J9621 Acute and chronic respiratory failure with hypoxia: Secondary | ICD-10-CM | POA: Diagnosis present

## 2020-11-25 DIAGNOSIS — I480 Paroxysmal atrial fibrillation: Secondary | ICD-10-CM | POA: Diagnosis present

## 2020-11-25 DIAGNOSIS — R7989 Other specified abnormal findings of blood chemistry: Secondary | ICD-10-CM | POA: Diagnosis present

## 2020-11-25 DIAGNOSIS — H9191 Unspecified hearing loss, right ear: Secondary | ICD-10-CM | POA: Diagnosis present

## 2020-11-25 DIAGNOSIS — I5041 Acute combined systolic (congestive) and diastolic (congestive) heart failure: Secondary | ICD-10-CM | POA: Diagnosis present

## 2020-11-25 DIAGNOSIS — I248 Other forms of acute ischemic heart disease: Secondary | ICD-10-CM | POA: Diagnosis present

## 2020-11-25 DIAGNOSIS — Z888 Allergy status to other drugs, medicaments and biological substances status: Secondary | ICD-10-CM

## 2020-11-25 DIAGNOSIS — E785 Hyperlipidemia, unspecified: Secondary | ICD-10-CM | POA: Diagnosis present

## 2020-11-25 DIAGNOSIS — I420 Dilated cardiomyopathy: Secondary | ICD-10-CM | POA: Diagnosis present

## 2020-11-25 LAB — TROPONIN I (HIGH SENSITIVITY): Troponin I (High Sensitivity): 69 ng/L — ABNORMAL HIGH (ref <18)

## 2020-11-25 LAB — CBC WITH DIFFERENTIAL/PLATELET
Abs Immature Granulocytes: 0.03 10*3/uL (ref 0.00–0.07)
Basophils Absolute: 0 10*3/uL (ref 0.0–0.1)
Basophils Relative: 0 %
Eosinophils Absolute: 0.1 10*3/uL (ref 0.0–0.5)
Eosinophils Relative: 2 %
HCT: 43.4 % (ref 39.0–52.0)
Hemoglobin: 14.6 g/dL (ref 13.0–17.0)
Immature Granulocytes: 0 %
Lymphocytes Relative: 34 %
Lymphs Abs: 2.3 10*3/uL (ref 0.7–4.0)
MCH: 32.9 pg (ref 26.0–34.0)
MCHC: 33.6 g/dL (ref 30.0–36.0)
MCV: 97.7 fL (ref 80.0–100.0)
Monocytes Absolute: 0.4 10*3/uL (ref 0.1–1.0)
Monocytes Relative: 6 %
Neutro Abs: 3.9 10*3/uL (ref 1.7–7.7)
Neutrophils Relative %: 58 %
Platelets: 155 10*3/uL (ref 150–400)
RBC: 4.44 MIL/uL (ref 4.22–5.81)
RDW: 13.3 % (ref 11.5–15.5)
WBC: 6.8 10*3/uL (ref 4.0–10.5)
nRBC: 0 % (ref 0.0–0.2)

## 2020-11-25 LAB — BASIC METABOLIC PANEL
Anion gap: 10 (ref 5–15)
BUN: 34 mg/dL — ABNORMAL HIGH (ref 6–20)
CO2: 28 mmol/L (ref 22–32)
Calcium: 8.8 mg/dL — ABNORMAL LOW (ref 8.9–10.3)
Chloride: 97 mmol/L — ABNORMAL LOW (ref 98–111)
Creatinine, Ser: 1.73 mg/dL — ABNORMAL HIGH (ref 0.61–1.24)
GFR, Estimated: 49 mL/min — ABNORMAL LOW (ref >60)
Glucose, Bld: 170 mg/dL — ABNORMAL HIGH (ref 70–99)
Potassium: 3.6 mmol/L (ref 3.5–5.1)
Sodium: 135 mmol/L (ref 135–145)

## 2020-11-25 LAB — GLUCOSE, CAPILLARY
Glucose-Capillary: 173 mg/dL — ABNORMAL HIGH (ref 70–99)
Glucose-Capillary: 196 mg/dL — ABNORMAL HIGH (ref 70–99)
Glucose-Capillary: 215 mg/dL — ABNORMAL HIGH (ref 70–99)

## 2020-11-25 LAB — COMPREHENSIVE METABOLIC PANEL
ALT: 43 U/L (ref 0–44)
AST: 40 U/L (ref 15–41)
Albumin: 3.8 g/dL (ref 3.5–5.0)
Alkaline Phosphatase: 275 U/L — ABNORMAL HIGH (ref 38–126)
Anion gap: 11 (ref 5–15)
BUN: 36 mg/dL — ABNORMAL HIGH (ref 6–20)
CO2: 31 mmol/L (ref 22–32)
Calcium: 8.7 mg/dL — ABNORMAL LOW (ref 8.9–10.3)
Chloride: 96 mmol/L — ABNORMAL LOW (ref 98–111)
Creatinine, Ser: 1.84 mg/dL — ABNORMAL HIGH (ref 0.61–1.24)
GFR, Estimated: 46 mL/min — ABNORMAL LOW (ref >60)
Glucose, Bld: 213 mg/dL — ABNORMAL HIGH (ref 70–99)
Potassium: 3.7 mmol/L (ref 3.5–5.1)
Sodium: 138 mmol/L (ref 135–145)
Total Bilirubin: 1.3 mg/dL — ABNORMAL HIGH (ref 0.3–1.2)
Total Protein: 8.7 g/dL — ABNORMAL HIGH (ref 6.5–8.1)

## 2020-11-25 LAB — RESP PANEL BY RT-PCR (FLU A&B, COVID) ARPGX2
Influenza A by PCR: NEGATIVE
Influenza B by PCR: NEGATIVE
SARS Coronavirus 2 by RT PCR: NEGATIVE

## 2020-11-25 LAB — MAGNESIUM
Magnesium: 1.6 mg/dL — ABNORMAL LOW (ref 1.7–2.4)
Magnesium: 2.3 mg/dL (ref 1.7–2.4)

## 2020-11-25 LAB — BRAIN NATRIURETIC PEPTIDE: B Natriuretic Peptide: 790.9 pg/mL — ABNORMAL HIGH (ref 0.0–100.0)

## 2020-11-25 MED ORDER — ONDANSETRON HCL 4 MG/2ML IJ SOLN: 4.0000 mg | Freq: Four times a day (QID) | INTRAMUSCULAR | Status: DC | PRN

## 2020-11-25 MED ORDER — ATORVASTATIN CALCIUM 20 MG PO TABS
40.0000 mg | ORAL_TABLET | Freq: Every day | ORAL | Status: DC
  Administered 2020-11-26: 40 mg via ORAL
  Filled 2020-11-25: qty 2

## 2020-11-25 MED ORDER — ACETAMINOPHEN 325 MG PO TABS
325.0000 mg | ORAL_TABLET | Freq: Four times a day (QID) | ORAL | Status: DC | PRN
  Administered 2020-11-26: 325 mg via ORAL
  Filled 2020-11-25: qty 1

## 2020-11-25 MED ORDER — NITROGLYCERIN 0.4 MG SL SUBL: 0.4000 mg | SUBLINGUAL_TABLET | SUBLINGUAL | Status: DC | PRN

## 2020-11-25 MED ORDER — ONDANSETRON HCL 4 MG PO TABS: 4.0000 mg | ORAL_TABLET | Freq: Four times a day (QID) | ORAL | Status: DC | PRN

## 2020-11-25 MED ORDER — NITROGLYCERIN 0.4 MG SL SUBL
SUBLINGUAL_TABLET | SUBLINGUAL | Status: AC
  Filled 2020-11-25: qty 3

## 2020-11-25 MED ORDER — PANTOPRAZOLE SODIUM 40 MG PO TBEC: 40.0000 mg | DELAYED_RELEASE_TABLET | Freq: Every day | ORAL | 0 refills | Status: DC

## 2020-11-25 MED ORDER — AMIODARONE HCL 200 MG PO TABS
200.0000 mg | ORAL_TABLET | Freq: Two times a day (BID) | ORAL | Status: DC
  Administered 2020-11-26 – 2020-11-27 (×3): 200 mg via ORAL
  Filled 2020-11-25 (×3): qty 1

## 2020-11-25 MED ORDER — CHLORHEXIDINE GLUCONATE CLOTH 2 % EX PADS
6.0000 | MEDICATED_PAD | Freq: Every day | CUTANEOUS | Status: DC
  Filled 2020-11-25: qty 6

## 2020-11-25 MED ORDER — CARVEDILOL 3.125 MG PO TABS: 3.1250 mg | ORAL_TABLET | Freq: Two times a day (BID) | ORAL | 0 refills | Status: DC

## 2020-11-25 MED ORDER — CARVEDILOL 3.125 MG PO TABS: 3.1250 mg | ORAL_TABLET | Freq: Two times a day (BID) | ORAL | Status: DC

## 2020-11-25 MED ORDER — MAGNESIUM SULFATE 2 GM/50ML IV SOLN
2.0000 g | Freq: Once | INTRAVENOUS | Status: AC
  Administered 2020-11-25: 2 g via INTRAVENOUS
  Filled 2020-11-25: qty 50

## 2020-11-25 MED ORDER — SACUBITRIL-VALSARTAN 97-103 MG PO TABS
1.0000 | ORAL_TABLET | Freq: Two times a day (BID) | ORAL | Status: DC
  Administered 2020-11-26 – 2020-11-27 (×3): 1 via ORAL
  Filled 2020-11-25 (×4): qty 1

## 2020-11-25 MED ORDER — TIOTROPIUM BROMIDE MONOHYDRATE 18 MCG IN CAPS
18.0000 ug | ORAL_CAPSULE | Freq: Every day | RESPIRATORY_TRACT | Status: DC
  Administered 2020-11-26: 18 ug via RESPIRATORY_TRACT
  Filled 2020-11-25: qty 5

## 2020-11-25 MED ORDER — FUROSEMIDE 10 MG/ML IJ SOLN
80.0000 mg | Freq: Once | INTRAMUSCULAR | Status: AC
  Administered 2020-11-25: 80 mg via INTRAVENOUS
  Filled 2020-11-25: qty 8

## 2020-11-25 MED ORDER — TORSEMIDE 20 MG PO TABS: 40.0000 mg | ORAL_TABLET | Freq: Every day | ORAL | Status: DC

## 2020-11-25 MED ORDER — UMECLIDINIUM-VILANTEROL 62.5-25 MCG/INH IN AEPB
1.0000 | INHALATION_SPRAY | Freq: Every day | RESPIRATORY_TRACT | Status: DC
  Administered 2020-11-26 – 2020-11-27 (×2): 1 via RESPIRATORY_TRACT
  Filled 2020-11-25: qty 14

## 2020-11-25 MED ORDER — FUROSEMIDE 10 MG/ML IJ SOLN
60.0000 mg | Freq: Two times a day (BID) | INTRAMUSCULAR | Status: DC
  Administered 2020-11-26: 60 mg via INTRAVENOUS
  Filled 2020-11-25: qty 6

## 2020-11-25 MED ORDER — PANTOPRAZOLE SODIUM 40 MG PO TBEC
40.0000 mg | DELAYED_RELEASE_TABLET | Freq: Two times a day (BID) | ORAL | Status: DC
  Administered 2020-11-26 – 2020-11-27 (×4): 40 mg via ORAL
  Filled 2020-11-25 (×4): qty 1

## 2020-11-25 MED ORDER — CARVEDILOL 6.25 MG PO TABS
3.1250 mg | ORAL_TABLET | Freq: Two times a day (BID) | ORAL | Status: DC
  Administered 2020-11-26 – 2020-11-27 (×3): 3.125 mg via ORAL
  Filled 2020-11-25 (×3): qty 1

## 2020-11-25 MED ORDER — ACETAMINOPHEN 650 MG RE SUPP: 325.0000 mg | Freq: Four times a day (QID) | RECTAL | Status: DC | PRN

## 2020-11-25 NOTE — TOC Transition Note (Signed)
Transition of Care [facility]) - CM/SW Discharge Note   Patient Details  Name: Oscar [name] MRN: [id number] Date of Birth: February 14, 1976  [facility] CM/SW Contact:  Izola Price, RN Phone Number: 11/25/2020, 2:30 PM   Clinical Narrative:   Active TOC. Patient being discharged to Home/Self Care with no HH/DME orders/needs. Simmie Davies RN CM    Final next level of care: Home/Self Care     Patient Goals and CMS Choice        Discharge Placement                       Discharge Plan and Services                DME Arranged: N/A DME Agency: NA       HH Arranged: NA HH Agency: NA        Social Determinants of Health (SDOH) Interventions     Readmission Risk Interventions No flowsheet data found.

## 2020-11-25 NOTE — Discharge Summary (Signed)
Physician Discharge Summary  Patient ID: Oscar [name] MRN: [id number] DOB/AGE: 1976-05-21 45 y.o.  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 Echocardiogram performed 11/23/2020 showed ejection fraction less than 86% with diastolic dysfunction.  Patient is treated with IV Lasix.  #1. Acute on chronic combined systolic and diastolic congestive heart failure. Elevated troponin secondary to congestive heart failure exacerbation. Patient condition is improving.  He has been seen by cardiology, Dr. [name] had cleared patient for discharge.  Patient refused to stay in hospital for another day, at this point, I will start him with a follow-up with cardiology and PCP.  #2.  Chronic hypoxemic respiratory failure. Obstructive sleep apnea. Morbid obesity with obesity hypoventilation syndrome. Continue CPAP at nighttime.  3.  Paroxysmal atrial fibrillation. Continue Xarelto  and amiodarone.  4.  Acute kidney injury Renal function seem to be better after Lasix.  5.  Hypomagnesemia We will supplement again today.  6.  Mild thrombocytopenia. Stable  Consults: cardiology  Significant Diagnostic Studies:  1. Severely depressed LVF. 2. Left ventricular ejection fraction, by estimation, is <20%. The left ventricle has severely decreased function. The left ventricle demonstrates global hypokinesis. The left ventricular internal cavity size was severely dilated. There is moderate left ventricular hypertrophy. Left ventricular diastolic parameters are consistent with Grade I diastolic dysfunction (impaired relaxation). 3. Right ventricular systolic function is moderately reduced. The right ventricular size is moderately enlarged. Mildly increased right ventricular wall thickness. There is moderately elevated pulmonary artery systolic pressure. 4. Left atrial size was mild to moderately dilated. 5. Right atrial size was mild to moderately dilated. 6. The mitral valve is grossly normal. Mild mitral valve regurgitation. 7. Tricuspid valve regurgitation is moderate to severe. 8. The aortic valve is grossly normal. Aortic valve regurgitation is trivial. Mild aortic valve sclerosis is present, with no evidence of aortic valve stenosis.   Treatments: IV lasix  Discharge Exam: Blood pressure 98/84, pulse 88, temperature 98.1 F (36.7 C), resp. rate 18, height 5\' 6"  (1.676 m), weight (!) 139.8 kg, SpO2 95 %. General appearance: alert and cooperative Resp: Decreased breathing sounds Cardio: regular rate and rhythm, S1, S2 normal, no murmur, click, rub or gallop GI: soft, non-tender; bowel sounds normal; no masses,  no organomegaly Extremities: Trace leg edema  Disposition: Discharge disposition: 01-Home or Self Care       Discharge Instructions    Diet - low sodium heart healthy   Complete by: As directed    Fluid restriction 1556ml/day   Increase  activity slowly   Complete by: As directed      Allergies as of  11/25/2020      Reactions   Bidil [isosorb Dinitrate-hydralazine] Other (See Comments)   Migraine Headache Migraine Headache   Ciprofloxacin Other (See Comments)   Migraine Headache      Medication List    STOP taking these medications   omeprazole 40 MG capsule Commonly known as: PRILOSEC     TAKE these medications   amiodarone 200 MG tablet Commonly known as: PACERONE Take 200 mg by mouth 2 (two) times daily.   Anoro Ellipta 62.5-25 MCG/INH Aepb Generic drug: umeclidinium-vilanterol Inhale 1 puff into the lungs daily in the afternoon.   atorvastatin 40 MG tablet Commonly known as: LIPITOR Take 1 tablet (40 mg total) by mouth daily at 6 PM. What changed: when to take this   carvedilol 3.125 MG tablet Commonly known as: COREG Take 1 tablet (3.125 mg total) by mouth 2 (two) times daily with a meal.   clopidogrel 75 MG tablet Commonly known as: Plavix Take 1 tablet (75 mg total) by mouth daily.   empagliflozin 10 MG Tabs tablet Commonly known as: JARDIANCE Take by mouth daily.   pantoprazole 40 MG tablet Commonly known as: Protonix Take 1 tablet (40 mg total) by mouth daily.   rivaroxaban 20 MG Tabs tablet Commonly known as: XARELTO Take 20 mg by mouth daily with supper.   sacubitril-valsartan 97-103 MG Commonly known as: ENTRESTO Take 1 tablet by mouth 2 (two) times daily. What changed: Another medication with the same name was removed. Continue taking this medication, and follow the directions you see here.   tiotropium 18 MCG inhalation capsule Commonly known as: SPIRIVA Place 18 mcg into inhaler and inhale daily.   torsemide 20 MG tablet Commonly known as: DEMADEX Take 40 mg by mouth daily.       Follow-up [place], [place] Follow up in 1 week(s).   Specialty: General Practice Contact information: [address] [place]  [address] [phone]        Lujean Amel D, MD Follow up in 2 week(s).   Specialties: Cardiology, Internal Medicine Contact information: [place] [address] [phone]        [place] Follow up in 1 week(s).   Specialty: Cardiology Contact information: [place] [address] [place] [phone]              Signed: Sharen [name] 11/25/2020, 1:08 PM

## 2020-11-25 NOTE — Progress Notes (Signed)
Pt MEWS turned yellow d/t 22-24 respiration. Agricultural consultant notified. Pt placed on bipap machine. Will follow VS q2 and q4h. Pt resting well.

## 2020-11-25 NOTE — Progress Notes (Signed)
[facility] Cardiology    SUBJECTIVE: Patient states shortness of breath much improved now less dyspneic ambulating in the hall without shortness of breath without leg edema feels much improved after mild diuresis denies any chest pain   Vitals:   11/25/20 0251 11/25/20 0423 11/25/20 0545 11/25/20 0717  BP: 123/85  101/61 114/79  Pulse: 78  74 70  Resp: 18  19 17   Temp: 98.2 F (36.8 C)  98.2 F (36.8 C) 97.9 F (36.6 C)  TempSrc:   Axillary   SpO2: 91%  93% 96%  Weight:  (!) 139.8 kg    Height:         Intake/Output Summary (Last 24 hours) at 11/25/2020 0950 Last data filed at 11/25/2020 0050 Gross per 24 hour  Intake 120 ml  Output 1250 ml  Net -1130 ml      PHYSICAL EXAM  General: Well developed, well nourished, in no acute distress HEENT:  Normocephalic and atramatic Neck:  No JVD.  Lungs: Clear bilaterally to auscultation and percussion. Heart: HRRR . Normal S1 and S2 without gallops or murmurs.  Abdomen: Bowel sounds are positive, abdomen soft and non-tender  Msk:  Back normal, normal gait. Normal strength and tone for age. Extremities: No clubbing, cyanosis or edema.   Neuro: Alert and oriented X 3. Psych:  Good affect, responds appropriately   LABS: Basic Metabolic Panel: Recent Labs    11/24/20 0633 11/25/20 0405  NA 136 135  K 3.6 3.6  CL 97* 97*  CO2 30 28  GLUCOSE 173* 170*  BUN 36* 34*  CREATININE 1.84* 1.73*  CALCIUM 8.7* 8.8*  MG 1.8 1.6*   Liver Function Tests: Recent Labs    11/23/20 0806  AST 37  ALT 45*  ALKPHOS 253*  BILITOT 1.0  PROT 7.7  ALBUMIN 3.5   Recent Labs    11/23/20 0806  LIPASE 47   CBC: Recent Labs    11/23/20 0806 11/24/20 0633  WBC 5.7 5.6  NEUTROABS  --  3.0  HGB 14.2 13.6  HCT 41.6 40.3  MCV 96.7 98.3  PLT 141* 138*   Cardiac Enzymes: No results for input(s): CKTOTAL, CKMB, CKMBINDEX, TROPONINI in the last 72 hours. BNP: Invalid input(s): POCBNP D-Dimer: No results for input(s): DDIMER in the  last 72 hours. Hemoglobin A1C: Recent Labs    11/24/20 0633  HGBA1C 7.8*   Fasting Lipid Panel: No results for input(s): CHOL, HDL, LDLCALC, TRIG, CHOLHDL, LDLDIRECT in the last 72 hours. Thyroid Function Tests: Recent Labs    11/24/20 0633  TSH 2.250   Anemia Panel: No results for input(s): VITAMINB12, FOLATE, FERRITIN, TIBC, IRON, RETICCTPCT in the last 72 hours.  ECHOCARDIOGRAM COMPLETE  Result Date: 11/23/2020    ECHOCARDIOGRAM REPORT   Patient Name:   Oscar [name] Date of Exam: 11/23/2020 Medical Rec #:  [id number]        Height:       66.0 in Accession #:    [id number]       Weight:       308.6 lb Date of Birth:  1975-10-31       BSA:          2.405 m Patient Age:    45 years         BP:           113/77 mmHg Patient Gender: M                HR:  112 bpm. Exam Location:  [facility] Procedure: 2D Echo, Color Doppler, Cardiac Doppler and Intracardiac            Opacification Agent Indications:     Dyspnea R06.00  History:         Patient has prior history of Echocardiogram examinations. CHF,                  Defibrillator; Risk Factors:Sleep Apnea, Hypertension, Diabetes                  and Dyslipidemia.  Sonographer:     Charmayne Sheer RDCS (AE) Referring Phys:  [id number] AMY N COX Diagnosing Phys: Yolonda Kida MD  Sonographer Comments: Technically difficult study due to poor echo windows. Image acquisition challenging due to patient body habitus. IMPRESSIONS  1. Severely depressed LVF.  2. Left ventricular ejection fraction, by estimation, is <20%. The left ventricle has severely decreased function. The left ventricle demonstrates global hypokinesis. The left ventricular internal cavity size was severely dilated. There is moderate left  ventricular hypertrophy. Left ventricular diastolic parameters are consistent with Grade I diastolic dysfunction (impaired relaxation).  3. Right ventricular systolic function is moderately reduced. The right ventricular size is moderately enlarged.  Mildly increased right ventricular wall thickness. There is moderately elevated pulmonary artery systolic pressure.  4. Left atrial size was mild to moderately dilated.  5. Right atrial size was mild to moderately dilated.  6. The mitral valve is grossly normal. Mild mitral valve regurgitation.  7. Tricuspid valve regurgitation is moderate to severe.  8. The aortic valve is grossly normal. Aortic valve regurgitation is trivial. Mild aortic valve sclerosis is present, with no evidence of aortic valve stenosis. FINDINGS  Left Ventricle: Left ventricular ejection fraction, by estimation, is <20%. The left ventricle has severely decreased function. The left ventricle demonstrates global hypokinesis. Definity contrast agent was given IV to delineate the left ventricular endocardial borders. The left ventricular internal cavity size was severely dilated. There is moderate left ventricular hypertrophy. Left ventricular diastolic parameters are consistent with Grade I diastolic dysfunction (impaired relaxation). Right Ventricle: The right ventricular size is moderately enlarged. Mildly increased right ventricular wall thickness. Right ventricular systolic function is moderately reduced. There is moderately elevated pulmonary artery systolic pressure. Left Atrium: Left atrial size was mild to moderately dilated. Right Atrium: Right atrial size was mild to moderately dilated. Pericardium: There is no evidence of pericardial effusion. Mitral Valve: The mitral valve is grossly normal. Mild mitral valve regurgitation. MV peak gradient, 8.1 mmHg. The mean mitral valve gradient is 4.0 mmHg. Tricuspid Valve: The tricuspid valve is grossly normal. Tricuspid valve regurgitation is moderate to severe. Aortic Valve: The aortic valve is grossly normal. Aortic valve regurgitation is trivial. Mild aortic valve sclerosis is present, with no evidence of aortic valve stenosis. Aortic valve mean gradient measures 4.0 mmHg. Aortic valve peak  gradient measures 8.0 mmHg. Aortic valve area, by VTI measures 3.12 cm. Pulmonic Valve: The pulmonic valve was normal in structure. Pulmonic valve regurgitation is not visualized. Aorta: The ascending aorta was not well visualized. IAS/Shunts: The interatrial septum was not assessed. Additional Comments: Severely depressed LVF. A device lead is visualized.  LEFT VENTRICLE PLAX 2D LVIDd:         7.90 cm      Diastology LVIDs:         5.90 cm      LV e' lateral:   5.22 cm/s LV PW:  1.60 cm      LV E/e' lateral: 21.6 LV IVS:        1.40 cm LVOT diam:     3.10 cm LV SV:         75 LV SV Index:   31 LVOT Area:     7.55 cm  LV Volumes (MOD) LV vol d, MOD A2C: 452.0 ml LV vol d, MOD A4C: 347.0 ml LV vol s, MOD A2C: 366.0 ml LV vol s, MOD A4C: 338.0 ml LV SV MOD A2C:     86.0 ml LV SV MOD A4C:     347.0 ml LV SV MOD BP:      53.5 ml RIGHT VENTRICLE RV Basal diam:  3.50 cm LEFT ATRIUM              Index       RIGHT ATRIUM           Index LA diam:        6.30 cm  2.62 cm/m  RA Area:     17.00 cm LA Vol (A2C):   91.5 ml  38.05 ml/m RA Volume:   43.20 ml  17.96 ml/m LA Vol (A4C):   146.0 ml 60.71 ml/m LA Biplane Vol: 117.0 ml 48.65 ml/m  AORTIC VALVE                   PULMONIC VALVE AV Area (Vmax):    3.46 cm    PV Vmax:       0.91 m/s AV Area (Vmean):   3.69 cm    PV Vmean:      62.100 cm/s AV Area (VTI):     3.12 cm    PV VTI:        0.172 m AV Vmax:           141.00 cm/s PV Peak grad:  3.3 mmHg AV Vmean:          94.000 cm/s PV Mean grad:  2.0 mmHg AV VTI:            0.242 m AV Peak Grad:      8.0 mmHg AV Mean Grad:      4.0 mmHg LVOT Vmax:         64.60 cm/s LVOT Vmean:        45.900 cm/s LVOT VTI:          0.100 m LVOT/AV VTI ratio: 0.41  AORTA Ao Root diam: 4.00 cm MITRAL VALVE MV Area (PHT): 7.47 cm     SHUNTS MV Area VTI:   4.49 cm     Systemic VTI:  0.10 m MV Peak grad:  8.1 mmHg     Systemic Diam: 3.10 cm MV Mean grad:  4.0 mmHg MV Vmax:       1.42 m/s MV Vmean:      86.9 cm/s MV Decel Time: 102  msec MV E velocity: 113.00 cm/s Avrey Flanagin D Miklos Bidinger MD Electronically signed by Yolonda Kida MD Signature Date/Time: 11/23/2020/4:13:15 PM    Final      Echo severely depressed left ventricular function ejection fraction less than 25% LVE  TELEMETRY: Atrial fibrillation rate of 100 wide-complex left bundle branch block  ASSESSMENT AND PLAN:  Principal Problem:   Acute exacerbation of CHF (congestive heart failure) (HCC) Active Problems:   Acute combined systolic and diastolic heart failure (HCC)   Elevated troponin   Chronic systolic heart failure (HCC)   Cardiac pacemaker   Obstructive sleep  apnea   Hepatic cirrhosis, unspecified hepatic cirrhosis type, unspecified whether ascites present ([place])   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 Fib Agree with inhalers supplemental oxygen for COPD and hypoxemia Diabetes continue current medical management recommend weight loss diabetic diet Sleep study CPAP if indicated weight loss follow-up with pulmonary For obesity recommend significant weight loss exercise portion control Consider switching to torsemide 40 mg twice a day instead of Lasix Consider low-dose digoxin 0.125 daily for heart failure and A. Fib Have the patient follow-up AICD pacer clinic for recurrent interrogation Have the patient follow-up with nephrology for renal insufficiency  Yolonda Kida, MD 11/25/2020 9:50 AM

## 2020-11-25 NOTE — Discharge Instructions (Signed)

## 2020-11-25 NOTE — ED Provider Notes (Signed)
[facility] Emergency Department Provider Note  ____________________________________________   Event Date/Time   First MD Initiated Contact with Patient 11/25/20 1951     (approximate)  I have reviewed the triage vital signs and the nursing notes.   HISTORY  Chief Complaint No chief complaint on file.    HPI Oscar [name] is a 45 y.o. male  Here with SOB.  Patient has an extensive cardiac history including CHF, dilated cardiomyopathy, recent admission for A. fib, and is here with acute shortness of breath.  The patient was actually just discharged from the hospital today.  He states he took his medications as prescribed.  He took his Entresto and amiodarone and reports that approximately an hour later, despite arrival, he developed acute shortness of breath.  He felt like he suddenly could not breathe.  He began coughing up blood-tinged frothy sputum.  He states he felt extremely short of breath with a dull chest pressure.  He did not feel any palpitations.  He does not believe his AICD fired.  He was reportedly severely hypoxic in the 70s on EMS arrival.  He was placed on CPAP with improvement.  Denies any current chest pain.   Symptoms worse when off CPAP and with lying flat.       Past Medical History:  Diagnosis Date  . AICD (automatic cardioverter/defibrillator) present   . Asthma   . Cardiomyopathy ([place])   . CHF (congestive heart failure) ([place])   . Coronary artery disease   . Deafness in right ear   . Diabetes mellitus without complication ([place])   . Dilated cardiomyopathy ([place])   . Dysrhythmia    svt  . 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 GERD (gastroesophageal reflux disease)   . Hyperlipidemia   . Hypertension   . Hypoxemia   . Hypoxemia   . Mild obesity   . Myocardial infarction ([place])    8527,7824,2/35  . Pancreatitis   . Sleep apnea    osa    Patient Active Problem List   Diagnosis Date Noted  . Obesity, Class III,  BMI 40-49.9 (morbid obesity) ([place]) 11/24/2020  . Acute on chronic diastolic (congestive) heart failure ([place]) 11/24/2020  . Acute exacerbation of CHF (congestive heart failure) ([place]) 11/23/2020  . Hepatic cirrhosis, unspecified hepatic cirrhosis type, unspecified whether ascites present ([place]) 11/23/2020  . Type 2 diabetes mellitus with other diabetic neurological complication ([place]) 36/14/4315  . Obesity hypoventilation syndrome ([place]) 11/23/2020  . Atrial fibrillation with RVR ([place]) 11/23/2020  . ARF (acute renal failure) ([place]) 03/28/2020  . Acute on chronic combined systolic (congestive) and diastolic (congestive) heart failure ([place]) 02/27/2020  . COPD (chronic obstructive pulmonary disease) ([place]) 02/27/2020  . Sprain of foot 01/26/2020  . Foreign body granuloma of skin and subcutaneous tissue 05/26/2019  . Acute pancreatitis 05/19/2017  . Pancreatitis 09/12/2016  . Bronchitis 05/20/2016  . Constipation 11/08/2015  . Abdominal pain 11/02/2015  . Obstructive sleep apnea 07/31/2015  . Tobacco use 07/31/2015  . Diabetes ([place]) 07/10/2015  . Chronic systolic heart failure ([place]) 03/30/2015  . Cardiac pacemaker 03/30/2015  . Chronic respiratory failure with hypercapnia ([place]) 12/12/2014  . Elevated troponin   . Non-ischemic cardiomyopathy ([place])   . Hypoxemia   . Acute combined systolic and diastolic heart failure ([place])   . SVT (supraventricular tachycardia) ([place]) 12/09/2014  . NSTEMI (non-ST elevated myocardial infarction) ([place]) 12/09/2014  . Hypertension 12/09/2014    Past Surgical History:  Procedure Laterality  Date  . CARDIAC CATHETERIZATION  12/11/2014   Procedure: RIGHT/LEFT HEART CATH AND CORONARY ANGIOGRAPHY;  Surgeon: Lorretta Harp, MD;  Location: [facility] CATH LAB;  Service: Cardiovascular;;  . ICD LEAD REMOVAL N/A 03/30/2015   Procedure: ICD LEAD REMOVAL;  Surgeon: Marzetta Board, MD;  Location: [facility] ORS;  Service: Cardiovascular;  Laterality: N/A;  . IMPLANTABLE CARDIOVERTER  DEFIBRILLATOR IMPLANT    . INSERT / REPLACE / REMOVE PACEMAKER    . LEFT HEART CATHETERIZATION WITH CORONARY ANGIOGRAM N/A 12/09/2014   Procedure: LEFT HEART CATHETERIZATION WITH CORONARY ANGIOGRAM;  Surgeon: Burnell Blanks, MD;  Location: [facility] CATH LAB;  Service: Cardiovascular;  Laterality: N/A;    Prior to Admission medications   Medication Sig Start Date End Date Taking? Authorizing Provider  amiodarone (PACERONE) 200 MG tablet Take 200 mg by mouth 2 (two) times daily. 11/15/20 11/29/20  [provider]  atorvastatin (LIPITOR) 40 MG tablet Take 1 tablet (40 mg total) by mouth daily at 6 PM. Patient taking differently: Take 40 mg by mouth at bedtime. 11/04/18   Alisa Graff, FNP  carvedilol (COREG) 3.125 MG tablet Take 1 tablet (3.125 mg total) by mouth 2 (two) times daily with a meal. 11/25/20   Sharen Hones, MD  clopidogrel (PLAVIX) 75 MG tablet Take 1 tablet (75 mg total) by mouth daily. Patient not taking: No sig reported 02/28/20   Clabe Seal, PA-C  empagliflozin (JARDIANCE) 10 MG TABS tablet Take by mouth daily.    [provider]  pantoprazole (PROTONIX) 40 MG tablet Take 1 tablet (40 mg total) by mouth daily. 11/25/20 12/25/20  Sharen Hones, MD  rivaroxaban (XARELTO) 20 MG TABS tablet Take 20 mg by mouth daily with supper.    [provider]  sacubitril-valsartan (ENTRESTO) 97-103 MG Take 1 tablet by mouth 2 (two) times daily. Patient not taking: No sig reported 08/13/20   Darylene Price A, FNP  tiotropium (SPIRIVA) 18 MCG inhalation capsule Place 18 mcg into inhaler and inhale daily.    [provider]  torsemide (DEMADEX) 20 MG tablet Take 40 mg by mouth daily.    [provider]  umeclidinium-vilanterol (ANORO ELLIPTA) 62.5-25 MCG/INH AEPB Inhale 1 puff into the lungs daily in the afternoon. 01/31/19   [provider]    Allergies Bidil [isosorb dinitrate-hydralazine] and Ciprofloxacin  Family History  Problem Relation Age  of Onset  . Hypertension Mother   . Congestive Heart Failure Mother   . Hypertension Sister   . Diabetes Sister   . Pancreatitis Sister   . COPD Sister   . Pancreatitis Brother   . Anemia Neg Hx   . Arrhythmia Neg Hx   . Asthma Neg Hx   . Clotting disorder Neg Hx   . Fainting Neg Hx   . Heart attack Neg Hx   . Heart disease Neg Hx   . Heart failure Neg Hx   . Hyperlipidemia Neg Hx     Social History Social History   Tobacco Use  . Smoking status: Current Every Day Smoker    Packs/day: 0.50    Years: 24.00    Pack years: 12.00    Types: Cigarettes  . Smokeless tobacco: Never Used  . Tobacco comment: 4/5 Smoking 5 cigs a day  Vaping Use  . Vaping Use: Never used  Substance Use Topics  . Alcohol use: No  . Drug use: No    Review of Systems  Review of Systems  Constitutional: Negative for chills, fatigue and  fever.  HENT: Negative for sore throat.   Respiratory: Positive for cough and shortness of breath.   Cardiovascular: Positive for leg swelling. Negative for chest pain.  Gastrointestinal: Negative for abdominal pain.  Genitourinary: Negative for flank pain.  Musculoskeletal: Negative for neck pain.  Skin: Negative for rash and wound.  Allergic/Immunologic: Negative for immunocompromised state.  Neurological: Positive for weakness. Negative for numbness.  Hematological: Does not bruise/bleed easily.  All other systems reviewed and are negative.    ____________________________________________  PHYSICAL EXAM:      VITAL SIGNS: ED Triage Vitals  Enc Vitals Group     BP 11/25/20 1949 (!) 159/111     Pulse Rate 11/25/20 1949 (!) 118     Resp 11/25/20 1949 (!) 38     Temp --      Temp src --      SpO2 11/25/20 1947 91 %     Weight --      Height --      Head Circumference --      Peak Flow --      Pain Score --      Pain Loc --      Pain Edu? --      Excl. in [facility]? --      Physical Exam Vitals and nursing note reviewed.  Constitutional:       General: He is in acute distress.     Appearance: He is well-developed.  HENT:     Head: Normocephalic and atraumatic.  Eyes:     Conjunctiva/sclera: Conjunctivae normal.  Cardiovascular:     Rate and Rhythm: Regular rhythm. Tachycardia present.     Heart sounds: Normal heart sounds. No murmur heard. No friction rub.  Pulmonary:     Effort: Tachypnea and accessory muscle usage present. No respiratory distress.     Breath sounds: Examination of the right-upper field reveals rales. Examination of the left-upper field reveals rales. Examination of the right-middle field reveals rales. Examination of the left-middle field reveals rales. Examination of the right-lower field reveals rales. Examination of the left-lower field reveals rales. Decreased breath sounds and rales present. No wheezing.  Abdominal:     General: There is no distension.     Palpations: Abdomen is soft.     Tenderness: There is no abdominal tenderness.  Musculoskeletal:     Cervical back: Neck supple.  Skin:    General: Skin is warm.     Capillary Refill: Capillary refill takes less than 2 seconds.  Neurological:     Mental Status: He is alert and oriented to person, place, and time.     Motor: No abnormal muscle tone.       ____________________________________________   LABS (all labs ordered are listed, but only abnormal results are displayed)  Labs Reviewed  COMPREHENSIVE METABOLIC PANEL - Abnormal; Notable for the following components:      Result Value   Chloride 96 (*)    Glucose, Bld 213 (*)    BUN 36 (*)    Creatinine, Ser 1.84 (*)    Calcium 8.7 (*)    Total Protein 8.7 (*)    Alkaline Phosphatase 275 (*)    Total Bilirubin 1.3 (*)    GFR, Estimated 46 (*)    All other components within normal limits  BRAIN NATRIURETIC PEPTIDE - Abnormal; Notable for the following components:   B Natriuretic Peptide 790.9 (*)    All other components within normal limits  BLOOD GAS, VENOUS - Abnormal;  Notable  for the following components:   pCO2, Ven 79 (*)    Bicarbonate 36.3 (*)    Acid-Base Excess 6.1 (*)    All other components within normal limits  TROPONIN I (HIGH SENSITIVITY) - Abnormal; Notable for the following components:   Troponin I (High Sensitivity) 69 (*)    All other components within normal limits  CBC WITH DIFFERENTIAL/PLATELET  MAGNESIUM    ____________________________________________  EKG: Normal sinus rhythm, ventricular rate 93.  PR 188, QRS 174, QTc 547.  Left bundle branch block.  No acute ST elevations compared to prior. ________________________________________  RADIOLOGY All imaging, including plain films, CT scans, and ultrasounds, independently reviewed by me, and interpretations confirmed via formal radiology reads.  ED MD interpretation:   CXR: Increased vascular congestion, worse on R  Official radiology report(s): DG Chest Portable 1 View  Result Date: 11/25/2020 CLINICAL DATA:  Shortness of breath EXAM: PORTABLE CHEST 1 VIEW COMPARISON:  11/23/2020 FINDINGS: Cardiac shadow remains enlarged. Defibrillator is again seen. Increasing vascular congestion and parenchymal edema is noted. No bony abnormality is noted. IMPRESSION: Increased vascular congestion and parenchymal edema particularly in the right lung. Electronically Signed   By: Inez Catalina M.D.   On: 11/25/2020 20:19    ____________________________________________  PROCEDURES   Procedure(s) performed (including Critical Care):  .Critical Care Performed by: Duffy Bruce, MD Authorized by: Duffy Bruce, MD   Critical care provider statement:    Critical care time (minutes):  35   Critical care time was exclusive of:  Separately billable procedures and treating other patients and teaching time   Critical care was necessary to treat or prevent imminent or life-threatening deterioration of the following conditions:  Cardiac failure, circulatory failure and respiratory failure   Critical  care was time spent personally by me on the following activities:  Development of treatment plan with patient or surrogate, discussions with consultants, evaluation of patient's response to treatment, examination of patient, obtaining history from patient or surrogate, ordering and performing treatments and interventions, ordering and review of laboratory studies, ordering and review of radiographic studies, pulse oximetry, re-evaluation of patient's condition and review of old charts   I assumed direction of critical care for this patient from another provider in my specialty: no   .1-3 Lead EKG Interpretation Performed by: Duffy Bruce, MD Authorized by: Duffy Bruce, MD     Interpretation: normal     ECG rate:  80-95   ECG rate assessment: normal     Rhythm: sinus rhythm     Ectopy: none     Conduction: normal   Comments:     Indication: Resp failure    ____________________________________________  INITIAL IMPRESSION / MDM / ASSESSMENT AND PLAN / ED COURSE  As part of my medical decision making, I reviewed the following data within the [place] notes reviewed and incorporated, Old chart reviewed, Notes from prior ED visits, and [place] Controlled Substance Database       *Oscar [name] was evaluated in Emergency Department on 11/25/2020 for the symptoms described in the history of present illness. He was evaluated in the context of the global COVID-19 pandemic, which necessitated consideration that the patient might be at risk for infection with the SARS-CoV-2 virus that causes COVID-19. Institutional protocols and algorithms that pertain to the evaluation of patients at risk for COVID-19 are in a state of rapid change based on information released by regulatory bodies including the CDC and federal and state organizations. These policies  and algorithms were followed during the patient's care in the ED.  Some ED evaluations and interventions may be delayed as  a result of limited staffing during the pandemic.*     Medical Decision Making: 45 year old male with extensive cardiac history here with acute shortness of breath.  Patient arrives in obvious respiratory distress with significant hypoxia, tachypnea, and increased work of breathing.  Chest x-ray reviewed and shows significant right-sided edema.  Suspect acute flash edema.  Patient placed on BiPAP with marked improvement.  Initial gas does show a mild acidosis but he seems to be markedly improved on BiPAP.  Blood pressure improved with nitroglycerin.  Otherwise, lab work is similar to his baseline from when he was just seen here in the ED.  BNP is elevated and troponin elevated likely due to demand.  EKG is nonischemic.  Will admit for management of acute hypoxic respiratory failure due to flash edema.  ____________________________________________  FINAL CLINICAL IMPRESSION(S) / ED DIAGNOSES  Final diagnoses:  Flash pulmonary edema (HCC)  Acute on chronic systolic congestive heart failure ([place])     MEDICATIONS GIVEN DURING THIS VISIT:  Medications  nitroGLYCERIN (NITROSTAT) SL tablet 0.4 mg (has no administration in time range)  nitroGLYCERIN (NITROSTAT) 0.4 MG SL tablet (  Given 11/25/20 1954)     ED Discharge Orders    None       Note:  This document was prepared using Dragon voice recognition software and may include unintentional dictation errors.   Duffy Bruce, MD 11/25/20 2105

## 2020-11-25 NOTE — Evaluation (Signed)
Physical Therapy Evaluation Patient Details Name: Oscar [name] MRN: [id number] DOB: 1976-02-26 Today's Date: 11/25/2020   History of Present Illness  45 y.o. male with medical history significant for heart failure with reduced ejection fraction, obesity, hypertension, status post dual-lead ICD, new onset atrial fibrillation, presents to the emergency department for chief concerns of worsening shortness of breath.  Echocardiogram performed 11/23/2020 showed ejection fraction less than 66% with diastolic dysfunction.  Clinical Impression  Pt did well with all aspects of PT exam.  He was able to easily go bed mobility and transfers w/o assist.  On arrival O2 was 4L (baseline 3L) with sats in the high 90s, maintained mid 90s on 3L t/o ambulation (~350 ft, no AD) with no excessive fatigue or shortness of breath.  Pt reports feeling about at his baseline and agrees that he does not need further PT intervention.  Will complete PT orders, no further needs.     Follow Up Recommendations No PT follow up    Equipment Recommendations  None recommended by PT    Recommendations for Other Services       Precautions / Restrictions Precautions Precautions: None Restrictions Weight Bearing Restrictions: No      Mobility  Bed Mobility Overal bed mobility: Independent                  Transfers Overall transfer level: Independent Equipment used: None                Ambulation/Gait Ambulation/Gait assistance: Independent Gait Distance (Feet): 350 Feet Assistive device: None       General Gait Details: Pt able to circumambulate the nurses' station X 2 with safe and consistent cadence.  On baseline 3L O2 he was able to maintain sats in the mid/low 90s w/o excessive shortness of breath or fatigue.  Stairs            Wheelchair Mobility    Modified Rankin (Stroke Patients Only)       Balance Overall balance assessment: Independent                                            Pertinent Vitals/Pain Pain Assessment: No/denies pain    Home Living Family/patient expects to be discharged to:: Private residence Living Arrangements: Alone Available Help at Discharge: Available PRN/intermittently (has aide 2hr/day) Type of Home: House Home Access: [place]:  (O2)      Prior Function Level of Independence: Independent         Comments: pt does not drive but reports that he gets out weekly     Hand Dominance        Extremity/Trunk Assessment   Upper Extremity Assessment Upper Extremity Assessment: Overall WFL for tasks assessed    Lower Extremity Assessment Lower Extremity Assessment: Overall WFL for tasks assessed       Communication      Cognition Arousal/Alertness: Awake/alert Behavior During Therapy: WFL for tasks assessed/performed Overall Cognitive Status: Within Functional Limits for tasks assessed                                        General Comments      Exercises     Assessment/Plan    PT Assessment  Patent does not need any further PT services  PT Problem List         PT Treatment Interventions      PT Goals (Current goals can be found in the Care Plan section)  Acute Rehab PT Goals Patient Stated Goal: go home PT Goal Formulation: All assessment and education complete, DC therapy    Frequency     Barriers to discharge        Co-evaluation               AM-PAC PT "6 Clicks" Mobility  Outcome Measure Help needed turning from your back to your side while in a flat bed without using bedrails?: None Help needed moving from lying on your back to sitting on the side of a flat bed without using bedrails?: None Help needed moving to and from a bed to a chair (including a wheelchair)?: None Help needed standing up from a chair using your arms (e.g., wheelchair or bedside chair)?: None Help needed to walk in hospital room?: None Help  needed climbing 3-5 steps with a railing? : None 6 Click Score: 24    End of Session Equipment Utilized During Treatment: Gait belt Activity Tolerance: Patient tolerated treatment well Patient left: in bed;with call bell/phone within reach Nurse Communication: Mobility status PT Visit Diagnosis: Difficulty in walking, not elsewhere classified (R26.2)    Time: 5686-1683 PT Time Calculation (min) (ACUTE ONLY): 20 min   Charges:   PT Evaluation $PT Eval Low Complexity: 1 Low          Kreg Shropshire, DPT 11/25/2020, 11:29 AM

## 2020-11-25 NOTE — H&P (Addendum)
History and Physical   THIERRY DOBOSZ VEH:[id number] DOB: December 04, 1975 DOA: 11/25/2020  PCP: [facility] [place]  Outpatient Specialists: Dr. [name], [facility] Patient coming from: home via EMS  I have personally briefly reviewed patient's old medical records in [place].  Chief Concern: Shortness of breath  HPI: Oscar [name] is a 45 y.o. male with medical history significant for heart failure reduced ejection fraction, obesity, hypertension, status post dual-lead ICD, atrial fibrillation, obstructive sleep apnea has not received his CPAP yet, prolonged QT, GERD, presents emergency department with chief concerns of shortness of breath.  He reports that he had just taken all of his medications when he got home however he suddenly developed shortness of breath and could not breathe.  He states that this caused him to panic and call EMS to bring to the emergency department for further evaluation.  He denied nausea, vomiting, chest pain, dysuria, fever.  He does endorse cough.  He reports he had one episode of bloody coughing up of mucus.  He states that this is not pink tinge but actual blood.  He reports he has never felt this way before.  He reports that in the emergency department the breathing mask that the ED provider started for patient has helped to improved the patient.  Reports that he has not received his CPAP machine yet.  He states the CPAP machine is scheduled for delivery either 11/26/2020 or 11/27/2020.  Social history: He currently smokes daily, 3 cigarettes and trying to quit.  He has not had any cigarettes since he has been in the hospital and he has felt fine.  Formerly he smoked 0.5 packs/day and he started at the age of 25.  He denies EtOH and recreational drug use.  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  Vaccination: He is fully vaccinated for COVID, 3 doses.  ROS: Constitutional: no weight change, no fever ENT/Mouth: no sore throat, no  rhinorrhea Eyes: no eye pain, no vision changes Cardiovascular: no chest pain, + dyspnea,  + edema, no palpitations Respiratory: + cough, + sputum, no wheezing Gastrointestinal: no nausea, no vomiting, no diarrhea, no constipation Genitourinary: no urinary incontinence, no dysuria, no hematuria Musculoskeletal: no arthralgias, no myalgias Skin: no skin lesions, no pruritus, Neuro: + weakness, no loss of consciousness, no syncope Psych: no anxiety, no depression, + decrease appetite Heme/Lymph: no bruising, no bleeding  ED Course: Discussed with ED provider, patient requiring hospitalization due to shortness of breath suspect secondary to pulmonary edema.  Vitals in the emergency department was remarkable for temperature of 98, respiration rate of 22, heart rate of 92, blood pressure 105/70, patient satting at 100% on BiPAP.  BNP elevated at 790.9, troponin 69.  VBG ordered in the emergency department was remarkable for 7.2 7/79/ O2 is pending.  Assessment/Plan  Principal Problem:   Pulmonary edema Active Problems:   Hypertension   Acute combined systolic and diastolic heart failure (HCC)   Chronic respiratory failure with hypercapnia (HCC)   Elevated troponin   Chronic systolic heart failure (HCC)   Cardiac pacemaker   Tobacco use   COPD (chronic obstructive pulmonary disease) (HCC)   Obesity hypoventilation syndrome ([place])   Pulmonary edema-etiology work-up in progress, possible atrial fibrillation -Continue BiPAP -Admit to stepdown with telemetry -Lasix 80 mg IV once -Lasix 60 mg IV twice daily -Strict I's and O's -CBC in the a.m. -ABG scheduled for 5 AM at 11/26/2020  History of stomach ulcers-checking H. pylori antigen and antibody -Would  recommend a.m. team to monitor his H&H -Recommend patient to follow-up with GI for possible endoscopy if this H. pylori is positive  Hemoptysis-x1 -Holding home Xarelto -Continue to monitor CBC -A.m. team to resume if patient  does not have any more hemoptysis, if patient continues to have hemoptysis while off of Xarelto would recommend pulmonary consult for possible bronchoscopy as patient has a significant smoking history  Atrial fibrillation-resumed home amiodarone 200 mg p.o. daily twice daily -Holding home Xarelto  Heart failure reduced ejection fraction-carvedilol 3.125 mg twice daily, holding home torsemide, resumed furosemide -Entresto 95/103 twice daily resumed -A.m. team to consider increasing carvedilol to 6.25 mg twice daily dosing per cardiology progress note on 11/24/2020 once patient is stabilized from pulmonary edema  History of CAD-resumed atorvastatin 40 mg nightly, nitroglycerin sublingual 0.4 milligrams sublingual every 5 minutes as needed for chest pain  Obstructive sleep apnea Obesity -Continue BiPAP -Patient has not received his CPAP machine yet and states he may get it on 11/26/2020 Hyperlipidemia-atorvastatin 40 mg nightly  GERD-PPI  COPD-Anoro Ellipta daily, tiotropium inhalation daily  Chart reviewed.   DVT prophylaxis: TED hose, no pharmacologic DVT prophylaxis as patient is complaining of hemoptysis Code Status: Full code Diet: Heart healthy/carb modified Family Communication: No Disposition Plan: Pending clinical course Consults called: None at this time Admission status: Stepdown, with telemetry, observation  Past Medical History:  Diagnosis Date  . AICD (automatic cardioverter/defibrillator) present   . Asthma   . Cardiomyopathy ([place])   . CHF (congestive heart failure) ([place])   . Coronary artery disease   . Deafness in right ear   . Diabetes mellitus without complication ([place])   . Dilated cardiomyopathy ([place])   . Dysrhythmia    svt  . 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 GERD (gastroesophageal reflux disease)   . Hyperlipidemia   . Hypertension   . Hypoxemia   . Hypoxemia   . Mild obesity   . Myocardial infarction ([place])    9735,3299,2/42  .  Pancreatitis   . Sleep apnea    osa   Past Surgical History:  Procedure Laterality Date  . CARDIAC CATHETERIZATION  12/11/2014   Procedure: RIGHT/LEFT HEART CATH AND CORONARY ANGIOGRAPHY;  Surgeon: Lorretta Harp, MD;  Location: [facility] CATH LAB;  Service: Cardiovascular;;  . ICD LEAD REMOVAL N/A 03/30/2015   Procedure: ICD LEAD REMOVAL;  Surgeon: Marzetta Board, MD;  Location: [facility] ORS;  Service: Cardiovascular;  Laterality: N/A;  . IMPLANTABLE CARDIOVERTER DEFIBRILLATOR IMPLANT    . INSERT / REPLACE / REMOVE PACEMAKER    . LEFT HEART CATHETERIZATION WITH CORONARY ANGIOGRAM N/A 12/09/2014   Procedure: LEFT HEART CATHETERIZATION WITH CORONARY ANGIOGRAM;  Surgeon: Burnell Blanks, MD;  Location: [facility] CATH LAB;  Service: Cardiovascular;  Laterality: N/A;   Social History:  reports that he has been smoking cigarettes. He has a 12.00 pack-year smoking history. He has never used smokeless tobacco. He reports that he does not drink alcohol and does not use drugs.  Allergies  Allergen Reactions  . Bidil [Isosorb Dinitrate-Hydralazine] Other (See Comments)    Migraine Headache Migraine Headache  . Ciprofloxacin Other (See Comments)    Migraine Headache   Family History  Problem Relation Age of Onset  . Hypertension Mother   . Congestive Heart Failure Mother   . Hypertension Sister   . Diabetes Sister   . Pancreatitis Sister   . COPD Sister   . Pancreatitis Brother   . Anemia Neg Hx   .  Arrhythmia Neg Hx   . Asthma Neg Hx   . Clotting disorder Neg Hx   . Fainting Neg Hx   . Heart attack Neg Hx   . Heart disease Neg Hx   . Heart failure Neg Hx   . Hyperlipidemia Neg Hx    Family history: Family history reviewed and not pertinent  Prior to Admission medications   Medication Sig Start Date End Date Taking? Authorizing Provider  amiodarone (PACERONE) 200 MG tablet Take 200 mg by mouth 2 (two) times daily. 11/15/20 11/29/20  [provider]  atorvastatin (LIPITOR) 40 MG tablet  Take 1 tablet (40 mg total) by mouth daily at 6 PM. Patient taking differently: Take 40 mg by mouth at bedtime. 11/04/18   Alisa Graff, FNP  carvedilol (COREG) 3.125 MG tablet Take 1 tablet (3.125 mg total) by mouth 2 (two) times daily with a meal. 11/25/20   Sharen Hones, MD  clopidogrel (PLAVIX) 75 MG tablet Take 1 tablet (75 mg total) by mouth daily. Patient not taking: No sig reported 02/28/20   Clabe Seal, PA-C  empagliflozin (JARDIANCE) 10 MG TABS tablet Take by mouth daily.    [provider]  pantoprazole (PROTONIX) 40 MG tablet Take 1 tablet (40 mg total) by mouth daily. 11/25/20 12/25/20  Sharen Hones, MD  rivaroxaban (XARELTO) 20 MG TABS tablet Take 20 mg by mouth daily with supper.    [provider]  sacubitril-valsartan (ENTRESTO) 97-103 MG Take 1 tablet by mouth 2 (two) times daily. Patient not taking: No sig reported 08/13/20   Darylene Price A, FNP  tiotropium (SPIRIVA) 18 MCG inhalation capsule Place 18 mcg into inhaler and inhale daily.    [provider]  torsemide (DEMADEX) 20 MG tablet Take 40 mg by mouth daily.    [provider]  umeclidinium-vilanterol (ANORO ELLIPTA) 62.5-25 MCG/INH AEPB Inhale 1 puff into the lungs daily in the afternoon. 01/31/19   [provider]   Physical Exam: Vitals:   11/25/20 2030 11/25/20 2100 11/25/20 2130 11/25/20 2200  BP: 105/70 122/87 (!) 118/51 109/77  Pulse: 95 92 90 90  Resp: 14 (!) 24 (!) 25 (!) 22  Temp:      TempSrc:      SpO2: 98% 100% 100% 100%  Weight:      Height:       Constitutional: appears older than chronological age, NAD, calm, comfortable Eyes: PERRL, lids and conjunctivae normal ENMT: Mucous membranes are moist. Posterior pharynx clear of any exudate or lesions. Age-appropriate dentition. Hearing appropriate Neck: normal, supple, no masses, no thyromegaly Respiratory: clear to auscultation bilaterally, no wheezing, no crackles.  Mild increase respiratory effort.  Mild  accessory muscle use.  Cardiovascular: Regular rate and rhythm, no murmurs / rubs / gallops. No extremity edema. 2+ pedal pulses. No carotid bruits.  Abdomen: Morbidly obese abdomen, no tenderness, no masses palpated, no hepatosplenomegaly. Bowel sounds positive.  Musculoskeletal: no clubbing / cyanosis. No joint deformity upper and lower extremities. Good ROM, no contractures, no atrophy. Normal muscle tone.  Skin: no rashes, lesions, ulcers. No induration Neurologic: Sensation intact. Strength 5/5 in all 4.  Psychiatric: Normal judgment and insight. Alert and oriented x 3. Normal mood.   EKG: independently reviewed, showing sinus rhythm with rate of 93, QTc 547  Chest x-ray on Admission: I personally reviewed and I agree with radiologist reading as below.  DG Chest Portable 1 View  Result Date: 11/25/2020 CLINICAL DATA:  Shortness of breath EXAM: PORTABLE CHEST 1  VIEW COMPARISON:  11/23/2020 FINDINGS: Cardiac shadow remains enlarged. Defibrillator is again seen. Increasing vascular congestion and parenchymal edema is noted. No bony abnormality is noted. IMPRESSION: Increased vascular congestion and parenchymal edema particularly in the right lung. Electronically Signed   By: Inez Catalina M.D.   On: 11/25/2020 20:19   Labs on Admission: I have personally reviewed following labs  CBC: Recent Labs  Lab 11/23/20 0806 11/24/20 0633 11/25/20 1958  WBC 5.7 5.6 6.8  NEUTROABS  --  3.0 3.9  HGB 14.2 13.6 14.6  HCT 41.6 40.3 43.4  MCV 96.7 98.3 97.7  PLT 141* 138* 326   Basic Metabolic Panel: Recent Labs  Lab 11/23/20 0806 11/24/20 0633 11/25/20 0405 11/25/20 1958  NA 134* 136 135 138  K 3.8 3.6 3.6 3.7  CL 96* 97* 97* 96*  CO2 29 30 28 31   GLUCOSE 218* 173* 170* 213*  BUN 41* 36* 34* 36*  CREATININE 1.97* 1.84* 1.73* 1.84*  CALCIUM 8.7* 8.7* 8.8* 8.7*  MG 1.4* 1.8 1.6* 2.3   GFR: Estimated Creatinine Clearance: 70.6 mL/min (A) (by C-G formula based on SCr of 1.84 mg/dL  (H)).  Liver Function Tests: Recent Labs  Lab 11/23/20 0806 11/25/20 1958  AST 37 40  ALT 45* 43  ALKPHOS 253* 275*  BILITOT 1.0 1.3*  PROT 7.7 8.7*  ALBUMIN 3.5 3.8   Recent Labs  Lab 11/23/20 0806  LIPASE 47   HbA1C: Recent Labs    11/24/20 0633  HGBA1C 7.8*   CBG: Recent Labs  Lab 11/24/20 1145 11/24/20 1626 11/24/20 2105 11/25/20 0720 11/25/20 1119  GLUCAP 221* 197* 225* 173* 196*   Lipid Profile: No results for input(s): CHOL, HDL, LDLCALC, TRIG, CHOLHDL, LDLDIRECT in the last 72 hours.  Thyroid Function Tests: Recent Labs    11/24/20 0633  TSH 2.250   Urine analysis:    Component Value Date/Time   COLORURINE YELLOW (A) 04/12/2020 0804   APPEARANCEUR HAZY (A) 04/12/2020 0804   APPEARANCEUR Hazy 08/21/2014 0800   LABSPEC 1.006 04/12/2020 0804   LABSPEC 1.015 08/21/2014 0800   PHURINE 5.0 04/12/2020 0804   GLUCOSEU NEGATIVE 04/12/2020 0804   GLUCOSEU Negative 08/21/2014 0800   HGBUR NEGATIVE 04/12/2020 0804   BILIRUBINUR NEGATIVE 04/12/2020 0804   BILIRUBINUR Negative 08/21/2014 0800   KETONESUR 5 (A) 04/12/2020 0804   PROTEINUR 30 (A) 04/12/2020 0804   NITRITE NEGATIVE 04/12/2020 0804   LEUKOCYTESUR NEGATIVE 04/12/2020 0804   LEUKOCYTESUR 1+ 08/21/2014 0800   CRITICAL CARE Performed by: Briant Cedar Nikiyah Fackler  Total critical care time: 35 minutes  Critical care time was exclusive of separately billable procedures and treating other patients.  Critical care was necessary to treat or prevent imminent or life-threatening deterioration.  Critical care was time spent personally by me on the following activities: development of treatment plan with patient and/or surrogate as well as nursing, discussions with consultants, evaluation of patient's response to treatment, examination of patient, obtaining history from patient or surrogate, ordering and performing treatments and interventions, ordering and review of laboratory studies, ordering and review of  radiographic studies, pulse oximetry and re-evaluation of patient's condition.  Nahal Wanless N Susi Goslin D.O. [facility]  If 7PM-7AM, please contact overnight-coverage provider If 7AM-7PM, please contact day coverage provider [email or web address]  11/25/2020, 10:13 PM

## 2020-11-25 NOTE — ED Triage Notes (Signed)
Pt is from home, SOB. EMS arrived 77/78%, CPAP en-route. Diminished lung sounds. Pt DC's from hospital yesterday.

## 2020-11-25 NOTE — Progress Notes (Signed)
PROGRESS NOTE    Oscar [name]  MGQ:[id number] DOB: 1975/09/10 DOA: 11/23/2020 PCP: [facility] [place]   CC: shortness of breath. Brief Narrative:  Oscar Mcgrail Scogginsis a 45 y.o.malewith medical history significant forheart failure with reduced ejection fraction, obesity, hypertension, status post dual-lead ICD, new onset atrial fibrillation, presents to the emergency department for chief concerns of worsening shortness of breath. Echocardiogram performed 11/23/2020 showed ejection fraction less than 26% with diastolic dysfunction.  Patient is treated with IV Lasix.   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  Acute on chronic combined systolic and diastolic congestive heart failure. Elevated troponin secondary to congestive heart failure exacerbation. Patient is followed by cardiology, he still has some volume overload, continue IV Lasix, Entresto was started. Lower dose Coreg.  Titrate dose.  #2.  Chronic hypoxemic respiratory failure. Obstructive sleep apnea. Morbid obesity with obesity hypoventilation syndrome. Continue CPAP at nighttime.  3.  Paroxysmal atrial fibrillation. Continue Xarelto and amiodarone.  4.  Acute kidney injury Renal function seem to be better after Lasix, continue to follow  5.  Hypomagnesemia We will supplement  again today.  6.  Mild thrombocytopenia. Stable.     DVT prophylaxis: Xarelto Code Status: Full Family Communication:  Disposition Plan:  .   Status is: Inpatient  Remains inpatient appropriate because:Inpatient level of care appropriate due to severity of illness   Dispo: The patient is from: Home              Anticipated d/c is to: Home              Patient currently is not medically stable to d/c.   Difficult to place patient No        I/O last 3 completed shifts: In: 240 [P.O.:240] Out: 1935 [[id number]] No intake/output data recorded.     Consultants:   Cardiology  Procedures: None  Antimicrobials: None  Subjective: Patient feels much better today, he slept well overnight without paroxysmal nocturnal dyspnea.  But he still has short of breath when he was walking from the restroom to the bed. No cough. No fever chills  No dysuria hematuria   Objective: Vitals:   11/25/20 0251 11/25/20 0423 11/25/20 0545 11/25/20 0717  BP: 123/85  101/61 114/79  Pulse: 78  74 70  Resp: 18  19 17   Temp: 98.2 F (36.8 C)  98.2 F (36.8 C) 97.9 F (36.6 C)  TempSrc:   Axillary   SpO2: 91%  93% 96%  Weight:  (!) 139.8 kg    Height:        Intake/Output Summary (Last 24 hours) at 11/25/2020 1005 Last data filed at 11/25/2020 0050 Gross per 24 hour  Intake 120 ml  Output 1250 ml  Net -1130 ml   Filed Weights   11/23/20 1502 11/24/20 0328 11/25/20 0423  Weight: (!) 140.6 kg (!) 141.4 kg (!) 139.8 kg  Examination:  General exam: Appears calm and comfortable, morbid obese Respiratory system: Decreased breathing sounds. Respiratory effort normal. Cardiovascular system: S1 & S2 heard, RRR. No JVD, murmurs, rubs, gallops or clicks.  Gastrointestinal system: Abdomen is nondistended, soft and nontender. No organomegaly or masses felt. Normal bowel sounds heard. Central nervous system: Alert and oriented. No focal neurological deficits. Extremities: Trace leg  edema Skin: No rashes, lesions or ulcers Psychiatry: Judgement and insight appear normal. Mood & affect appropriate.     Data Reviewed: I have personally reviewed following labs and imaging studies  CBC: Recent Labs  Lab 11/23/20 0806 11/24/20 0633  WBC 5.7 5.6  NEUTROABS  --  3.0  HGB 14.2 13.6  HCT 41.6 40.3  MCV 96.7 98.3  PLT 141* 128*   Basic Metabolic Panel: Recent Labs  Lab 11/23/20 0806 11/24/20 0633 11/25/20 0405  NA 134* 136 135  K 3.8 3.6 3.6  CL 96* 97* 97*  CO2 29 30 28   GLUCOSE 218* 173* 170*  BUN 41* 36* 34*  CREATININE 1.97* 1.84* 1.73*  CALCIUM 8.7* 8.7* 8.8*  MG 1.4* 1.8 1.6*   GFR: Estimated Creatinine Clearance: 71.8 mL/min (A) (by C-G formula based on SCr of 1.73 mg/dL (H)). Liver Function Tests: Recent Labs  Lab 11/23/20 0806  AST 37  ALT 45*  ALKPHOS 253*  BILITOT 1.0  PROT 7.7  ALBUMIN 3.5   Recent Labs  Lab 11/23/20 0806  LIPASE 47   No results for input(s): AMMONIA in the last 168 hours. Coagulation Profile: No results for input(s): INR, PROTIME in the last 168 hours. Cardiac Enzymes: No results for input(s): CKTOTAL, CKMB, CKMBINDEX, TROPONINI in the last 168 hours. BNP (last 3 results) No results for input(s): PROBNP in the last 8760 hours. HbA1C: Recent Labs    11/24/20 0633  HGBA1C 7.8*   CBG: Recent Labs  Lab 11/24/20 1145 11/24/20 1626 11/24/20 2105 11/25/20 0720  GLUCAP 221* 197* 225* 173*   Lipid Profile: No results for input(s): CHOL, HDL, LDLCALC, TRIG, CHOLHDL, LDLDIRECT in the last 72 hours. Thyroid Function Tests: Recent Labs    11/24/20 0633  TSH 2.250   Anemia Panel: No results for input(s): VITAMINB12, FOLATE, FERRITIN, TIBC, IRON, RETICCTPCT in the last 72 hours. Sepsis Labs: No results for input(s): PROCALCITON, LATICACIDVEN in the last 168 hours.  Recent Results (from the past 240 hour(s))  Resp Panel by RT-PCR (Flu A&B, Covid) Nasopharyngeal Swab     Status: None   Collection Time:  11/23/20  9:30 AM   Specimen: Nasopharyngeal Swab; Nasopharyngeal(NP) swabs in vial transport medium  Result Value Ref Range Status   SARS Coronavirus 2 by RT PCR NEGATIVE NEGATIVE Final    Comment: (NOTE) SARS-CoV-2 target nucleic acids are NOT DETECTED.  The SARS-CoV-2 RNA is generally detectable in upper respiratory specimens during the acute phase of infection. The lowest concentration of SARS-CoV-2 viral copies this assay can detect is 138 copies/mL. A negative result does not preclude SARS-Cov-2 infection and should not be used as the sole basis for treatment or other patient management decisions. A negative result may occur with  improper specimen collection/handling, submission of specimen other than nasopharyngeal swab, presence of viral mutation(s) within the areas targeted by this assay, and inadequate number of viral copies(<138 copies/mL). A negative result must be combined with clinical observations, patient history, and epidemiological information. The expected result is Negative.  Fact Sheet for Patients:  EntrepreneurPulse.com.au  Fact Sheet for Healthcare Providers:  IncredibleEmployment.be  This test is  no t yet approved or cleared by the [place] and  has been authorized for detection and/or diagnosis of SARS-CoV-2 by FDA under an Emergency Use Authorization (EUA). This EUA will remain  in effect (meaning this test can be used) for the duration of the COVID-19 declaration under Section 564(b)(1) of the Act, 21 U.S.C.section 360bbb-3(b)(1), unless the authorization is terminated  or revoked sooner.       Influenza A by PCR NEGATIVE NEGATIVE Final   Influenza B by PCR NEGATIVE NEGATIVE Final    Comment: (NOTE) The Xpert Xpress SARS-CoV-2/FLU/RSV plus assay is intended as an aid in the diagnosis of influenza from Nasopharyngeal swab specimens and should not be used as a sole basis for treatment. Nasal washings  and aspirates are unacceptable for Xpert Xpress SARS-CoV-2/FLU/RSV testing.  Fact Sheet for Patients: EntrepreneurPulse.com.au  Fact Sheet for Healthcare Providers: IncredibleEmployment.be  This test is not yet approved or cleared by the [place] FDA and has been authorized for detection and/or diagnosis of SARS-CoV-2 by FDA under an Emergency Use Authorization (EUA). This EUA will remain in effect (meaning this test can be used) for the duration of the COVID-19 declaration under Section 564(b)(1) of the Act, 21 U.S.C. section 360bbb-3(b)(1), unless the authorization is terminated or revoked.  Performed at [facility], [address], [place], [place] [address]          Radiology Studies: ECHOCARDIOGRAM COMPLETE  Result Date: 11/23/2020    ECHOCARDIOGRAM REPORT   Patient Name:   Oscar [name] Date of Exam: 11/23/2020 Medical Rec #:  [id number]        Height:       66.0 in Accession #:    [id number]       Weight:       308.6 lb Date of Birth:  1975/09/18       BSA:          2.405 m Patient Age:    64 years         BP:           113/77 mmHg Patient Gender: M                HR:           112 bpm. Exam Location:  [facility] Procedure: 2D Echo, Color Doppler, Cardiac Doppler and Intracardiac            Opacification Agent Indications:     Dyspnea R06.00  History:         Patient has prior history of Echocardiogram examinations. CHF,                  Defibrillator; Risk Factors:Sleep Apnea, Hypertension, Diabetes                  and Dyslipidemia.  Sonographer:     Charmayne Sheer RDCS (AE) Referring Phys:  [id number] AMY N COX Diagnosing Phys: Yolonda Kida MD  Sonographer Comments: Technically difficult study due to poor echo windows. Image acquisition challenging due to patient body habitus. IMPRESSIONS  1. Severely depressed LVF.  2. Left ventricular ejection fraction, by estimation, is <20%. The left ventricle has severely decreased function.  The left ventricle demonstrates global hypokinesis. The left ventricular internal cavity size was severely dilated. There is moderate left  ventricular hypertrophy. Left ventricular diastolic parameters are consistent with Grade I diastolic dysfunction (impaired relaxation).  3. Right ventricular systolic function is moderately reduced. The right ventricular size is moderately  enlarged. Mildly increased right ventricular wall thickness. There is moderately elevated pulmonary artery systolic pressure.  4. Left atrial size was mild to moderately dilated.  5. Right atrial size was mild to moderately dilated.  6. The mitral valve is grossly normal. Mild mitral valve regurgitation.  7. Tricuspid valve regurgitation is moderate to severe.  8. The aortic valve is grossly normal. Aortic valve regurgitation is trivial. Mild aortic valve sclerosis is present, with no evidence of aortic valve stenosis. FINDINGS  Left Ventricle: Left ventricular ejection fraction, by estimation, is <20%. The left ventricle has severely decreased function. The left ventricle demonstrates global hypokinesis. Definity contrast agent was given IV to delineate the left ventricular endocardial borders. The left ventricular internal cavity size was severely dilated. There is moderate left ventricular hypertrophy. Left ventricular diastolic parameters are consistent with Grade I diastolic dysfunction (impaired relaxation). Right Ventricle: The right ventricular size is moderately enlarged. Mildly increased right ventricular wall thickness. Right ventricular systolic function is moderately reduced. There is moderately elevated pulmonary artery systolic pressure. Left Atrium: Left atrial size was mild to moderately dilated. Right Atrium: Right atrial size was mild to moderately dilated. Pericardium: There is no evidence of pericardial effusion. Mitral Valve: The mitral valve is grossly normal. Mild mitral valve regurgitation. MV peak gradient, 8.1 mmHg.  The mean mitral valve gradient is 4.0 mmHg. Tricuspid Valve: The tricuspid valve is grossly normal. Tricuspid valve regurgitation is moderate to severe. Aortic Valve: The aortic valve is grossly normal. Aortic valve regurgitation is trivial. Mild aortic valve sclerosis is present, with no evidence of aortic valve stenosis. Aortic valve mean gradient measures 4.0 mmHg. Aortic valve peak gradient measures 8.0 mmHg. Aortic valve area, by VTI measures 3.12 cm. Pulmonic Valve: The pulmonic valve was normal in structure. Pulmonic valve regurgitation is not visualized. Aorta: The ascending aorta was not well visualized. IAS/Shunts: The interatrial septum was not assessed. Additional Comments: Severely depressed LVF. A device lead is visualized.  LEFT VENTRICLE PLAX 2D LVIDd:         7.90 cm      Diastology LVIDs:         5.90 cm      LV e' lateral:   5.22 cm/s LV PW:         1.60 cm      LV E/e' lateral: 21.6 LV IVS:        1.40 cm LVOT diam:     3.10 cm LV SV:         75 LV SV Index:   31 LVOT Area:     7.55 cm  LV Volumes (MOD) LV vol d, MOD A2C: 452.0 ml LV vol d, MOD A4C: 347.0 ml LV vol s, MOD A2C: 366.0 ml LV vol s, MOD A4C: 338.0 ml LV SV MOD A2C:     86.0 ml LV SV MOD A4C:     347.0 ml LV SV MOD BP:      53.5 ml RIGHT VENTRICLE RV Basal diam:  3.50 cm LEFT ATRIUM              Index       RIGHT ATRIUM           Index LA diam:        6.30 cm  2.62 cm/m  RA Area:     17.00 cm LA Vol (A2C):   91.5 ml  38.05 ml/m RA Volume:   43.20 ml  17.96 ml/m LA Vol (A4C):   146.0 ml  60.71 ml/m LA Biplane Vol: 117.0 ml 48.65 ml/m  AORTIC VALVE                   PULMONIC VALVE AV Area (Vmax):    3.46 cm    PV Vmax:       0.91 m/s AV Area (Vmean):   3.69 cm    PV Vmean:      62.100 cm/s AV Area (VTI):     3.12 cm    PV VTI:        0.172 m AV Vmax:           141.00 cm/s PV Peak grad:  3.3 mmHg AV Vmean:          94.000 cm/s PV Mean grad:  2.0 mmHg AV VTI:            0.242 m AV Peak Grad:      8.0 mmHg AV Mean Grad:      4.0  mmHg LVOT Vmax:         64.60 cm/s LVOT Vmean:        45.900 cm/s LVOT VTI:          0.100 m LVOT/AV VTI ratio: 0.41  AORTA Ao Root diam: 4.00 cm MITRAL VALVE MV Area (PHT): 7.47 cm     SHUNTS MV Area VTI:   4.49 cm     Systemic VTI:  0.10 m MV Peak grad:  8.1 mmHg     Systemic Diam: 3.10 cm MV Mean grad:  4.0 mmHg MV Vmax:       1.42 m/s MV Vmean:      86.9 cm/s MV Decel Time: 102 msec MV E velocity: 113.00 cm/s Yolonda Kida MD Electronically signed by Yolonda Kida MD Signature Date/Time: 11/23/2020/4:13:15 PM    Final         Scheduled Meds: . amiodarone  200 mg Oral BID  . atorvastatin  40 mg Oral QHS  . empagliflozin  10 mg Oral Daily  . insulin aspart  0-15 Units Subcutaneous TID WC  . insulin aspart  0-5 Units Subcutaneous QHS  . pantoprazole  80 mg Oral Daily  . rivaroxaban  20 mg Oral Q supper  . sacubitril-valsartan  1 tablet Oral BID  . sodium chloride flush  3 mL Intravenous Q12H  . tiotropium  18 mcg Inhalation Daily  . umeclidinium-vilanterol  1 puff Inhalation Q1500   Continuous Infusions: . sodium chloride    . magnesium sulfate bolus IVPB 2 g (11/25/20 0954)     LOS: 1 day    Time spent: 27 minutes    Sharen Hones, MD [facility]   To contact the attending provider between 7A-7P or the covering provider during after hours 7P-7A, please log into the web site [email or web address] and access using universal [place] password for that web site. If you do not have the password, please call the hospital operator.  11/25/2020, 10:05 AM

## 2020-11-25 NOTE — Plan of Care (Signed)

## 2020-11-26 ENCOUNTER — Encounter: Payer: Self-pay | Admitting: Internal Medicine

## 2020-11-26 DIAGNOSIS — Z20822 Contact with and (suspected) exposure to covid-19: Secondary | ICD-10-CM | POA: Diagnosis present

## 2020-11-26 DIAGNOSIS — E872 Acidosis: Secondary | ICD-10-CM | POA: Diagnosis present

## 2020-11-26 DIAGNOSIS — J811 Chronic pulmonary edema: Secondary | ICD-10-CM | POA: Diagnosis present

## 2020-11-26 DIAGNOSIS — I480 Paroxysmal atrial fibrillation: Secondary | ICD-10-CM | POA: Diagnosis present

## 2020-11-26 DIAGNOSIS — I2510 Atherosclerotic heart disease of native coronary artery without angina pectoris: Secondary | ICD-10-CM | POA: Diagnosis present

## 2020-11-26 DIAGNOSIS — Z888 Allergy status to other drugs, medicaments and biological substances status: Secondary | ICD-10-CM | POA: Diagnosis not present

## 2020-11-26 DIAGNOSIS — I248 Other forms of acute ischemic heart disease: Secondary | ICD-10-CM | POA: Diagnosis present

## 2020-11-26 DIAGNOSIS — H9191 Unspecified hearing loss, right ear: Secondary | ICD-10-CM | POA: Diagnosis present

## 2020-11-26 DIAGNOSIS — N289 Disorder of kidney and ureter, unspecified: Secondary | ICD-10-CM | POA: Diagnosis present

## 2020-11-26 DIAGNOSIS — Z825 Family history of asthma and other chronic lower respiratory diseases: Secondary | ICD-10-CM | POA: Diagnosis not present

## 2020-11-26 DIAGNOSIS — J810 Acute pulmonary edema: Secondary | ICD-10-CM | POA: Diagnosis not present

## 2020-11-26 DIAGNOSIS — R042 Hemoptysis: Secondary | ICD-10-CM | POA: Diagnosis present

## 2020-11-26 DIAGNOSIS — I5023 Acute on chronic systolic (congestive) heart failure: Secondary | ICD-10-CM

## 2020-11-26 DIAGNOSIS — I420 Dilated cardiomyopathy: Secondary | ICD-10-CM | POA: Diagnosis present

## 2020-11-26 DIAGNOSIS — K219 Gastro-esophageal reflux disease without esophagitis: Secondary | ICD-10-CM | POA: Diagnosis present

## 2020-11-26 DIAGNOSIS — J9621 Acute and chronic respiratory failure with hypoxia: Secondary | ICD-10-CM | POA: Diagnosis present

## 2020-11-26 DIAGNOSIS — E662 Morbid (severe) obesity with alveolar hypoventilation: Secondary | ICD-10-CM | POA: Diagnosis present

## 2020-11-26 DIAGNOSIS — Z6841 Body Mass Index (BMI) 40.0 and over, adult: Secondary | ICD-10-CM | POA: Diagnosis not present

## 2020-11-26 DIAGNOSIS — I110 Hypertensive heart disease with heart failure: Secondary | ICD-10-CM | POA: Diagnosis present

## 2020-11-26 DIAGNOSIS — E119 Type 2 diabetes mellitus without complications: Secondary | ICD-10-CM | POA: Diagnosis present

## 2020-11-26 DIAGNOSIS — F17210 Nicotine dependence, cigarettes, uncomplicated: Secondary | ICD-10-CM | POA: Diagnosis present

## 2020-11-26 DIAGNOSIS — Z95810 Presence of automatic (implantable) cardiac defibrillator: Secondary | ICD-10-CM | POA: Diagnosis not present

## 2020-11-26 DIAGNOSIS — J9612 Chronic respiratory failure with hypercapnia: Secondary | ICD-10-CM | POA: Diagnosis present

## 2020-11-26 DIAGNOSIS — J449 Chronic obstructive pulmonary disease, unspecified: Secondary | ICD-10-CM | POA: Diagnosis present

## 2020-11-26 DIAGNOSIS — E785 Hyperlipidemia, unspecified: Secondary | ICD-10-CM | POA: Diagnosis present

## 2020-11-26 DIAGNOSIS — I10 Essential (primary) hypertension: Secondary | ICD-10-CM | POA: Diagnosis not present

## 2020-11-26 DIAGNOSIS — Z9981 Dependence on supplemental oxygen: Secondary | ICD-10-CM | POA: Diagnosis not present

## 2020-11-26 LAB — BASIC METABOLIC PANEL
Anion gap: 9 (ref 5–15)
BUN: 36 mg/dL — ABNORMAL HIGH (ref 6–20)
CO2: 33 mmol/L — ABNORMAL HIGH (ref 22–32)
Calcium: 8.5 mg/dL — ABNORMAL LOW (ref 8.9–10.3)
Chloride: 98 mmol/L (ref 98–111)
Creatinine, Ser: 1.74 mg/dL — ABNORMAL HIGH (ref 0.61–1.24)
GFR, Estimated: 49 mL/min — ABNORMAL LOW (ref >60)
Glucose, Bld: 140 mg/dL — ABNORMAL HIGH (ref 70–99)
Potassium: 4 mmol/L (ref 3.5–5.1)
Sodium: 140 mmol/L (ref 135–145)

## 2020-11-26 LAB — CBC
HCT: 39.7 % (ref 39.0–52.0)
Hemoglobin: 13.8 g/dL (ref 13.0–17.0)
MCH: 33.8 pg (ref 26.0–34.0)
MCHC: 34.8 g/dL (ref 30.0–36.0)
MCV: 97.3 fL (ref 80.0–100.0)
Platelets: 140 10*3/uL — ABNORMAL LOW (ref 150–400)
RBC: 4.08 MIL/uL — ABNORMAL LOW (ref 4.22–5.81)
RDW: 13.2 % (ref 11.5–15.5)
WBC: 6.3 10*3/uL (ref 4.0–10.5)
nRBC: 0 % (ref 0.0–0.2)

## 2020-11-26 LAB — TROPONIN I (HIGH SENSITIVITY)
Troponin I (High Sensitivity): 152 ng/L (ref <18)
Troponin I (High Sensitivity): 161 ng/L (ref <18)
Troponin I (High Sensitivity): 190 ng/L (ref <18)

## 2020-11-26 LAB — MRSA PCR SCREENING: MRSA by PCR: NEGATIVE

## 2020-11-26 MED ORDER — TORSEMIDE 20 MG PO TABS
40.0000 mg | ORAL_TABLET | Freq: Two times a day (BID) | ORAL | Status: DC
  Administered 2020-11-26 – 2020-11-27 (×2): 40 mg via ORAL
  Filled 2020-11-26 (×2): qty 2

## 2020-11-26 MED ORDER — TORSEMIDE 20 MG PO TABS: 40.0000 mg | ORAL_TABLET | Freq: Every day | ORAL | Status: DC

## 2020-11-26 NOTE — Progress Notes (Addendum)
Notified Dr. Lorella Nimrod of pt refusing to wear CPAP. Complaining of his chest hurting from the pressure. Pulled CPAP off. Ok with MD to switch pt to BiPAP at night. Continue to monitor.

## 2020-11-26 NOTE — Progress Notes (Signed)
PROGRESS NOTE    Oscar [name]  IEP:[id number] DOB: 1975/12/20 DOA: 11/25/2020 PCP: [facility] [place]   Brief Narrative: Taken from H&P. Oscar [name] is a 45 y.o. male with medical history significant for heart failure reduced ejection fraction, obesity, hypertension, status post dual-lead ICD, atrial fibrillation, obstructive sleep apnea has not received his CPAP yet, prolonged QT, GERD, presents emergency department with chief concerns of shortness of breath.  He reports that he had just taken all of his medications when he got home however he suddenly developed shortness of breath and could not breathe.  He states that this caused him to panic and call EMS to bring to the emergency department for further evaluation.  Chest x-ray with worsening pulmonary vascular congestion, initially requiring BiPAP.  Responded well to IV Lasix.  Wife does not want to take him back until they have CPAP machine at home, apparently having delays for many months-we will ask social worker to look into that.  Subjective: Patient was seen and examined during morning rounds.  Breathing improved.  Wife is very upset about CPAP not being delivered.  Apparently she will told by the company that there is a 44-month waiting time.  She was very Administrator, arts that she will not take him back home unless we arrange that machine.  Assessment & Plan:   Principal Problem:   Pulmonary edema Active Problems:   Hypertension   Acute combined systolic and diastolic heart failure (HCC)   Chronic respiratory failure with hypercapnia (HCC)   Elevated troponin   Chronic systolic heart failure (HCC)   Cardiac pacemaker   Tobacco use   COPD (chronic obstructive pulmonary disease) (HCC)   Obesity hypoventilation syndrome (HCC)  Acute hypoxic respiratory failure secondary to pulmonary edema with acute on chronic HFrEF.  Patient came back after few hours of discharge.  Echocardiogram done on 11/23/2020 with a  EF of less than 20%.  Symptoms improved with IV Lasix.  Per patient he did took his medications when went home but did not had CPAP and breathing got worse due to obstructive sleep apnea and obesity hypoventilation syndrome. -Switch IV Lasix with torsemide and increase the dose to twice daily. -Continue home dose of Entresto -Continue home dose of carvedilol -Cardiology consult. -Daily BMP and weight -Strict intake and output -CPAP at night  Elevated troponin/history of CAD.  No chest pain.  Peaked at 190, most likely secondary to demand ischemia with pulmonary edema. -Continue home dose of atorvastatin.  Complaint of one episode of hemoptysis.  Hemoglobin stable, holding Xarelto. -Keep holding for another day and we will resume if there is no more hemoptysis.  Paroxysmal atrial fibrillation.  No acute concern -Continue home dose of amiodarone -Continue holding Xarelto for another day  Obstructive sleep apnea with obesity hypoventilation syndrome.  Apparently there is having some difficulty and delays getting home CPAP machine. -We will ask social worker to see if we can help. -Continue with CPAP at night.  Stage III obesity. Estimated body mass index is 53.59 kg/m as calculated from the following:   Height as of this encounter: 5\' 6"  (1.676 m).   Weight as of this encounter: 150.6 kg.  This will complicate overall prognosis.  COPD.  No acute concern. -Continue home meds  GERD. -Continue PPI  Objective: Vitals:   11/26/20 0635 11/26/20 0700 11/26/20 0800 11/26/20 0831  BP: (!) 121/98 (!) 131/95 114/90   Pulse: 78 87 74   Resp: 11 (!) 25 (!) 23  Temp:   98.2 F (36.8 C)   TempSrc:   Oral   SpO2: 94% 95% 96% 96%  Weight:      Height:        Intake/Output Summary (Last 24 hours) at 11/26/2020 1240 Last data filed at 11/26/2020 1200 Gross per 24 hour  Intake 460 ml  Output 4150 ml  Net -3690 ml   Filed Weights   11/25/20 2006  Weight: (!) 150.6 kg     Examination:  General exam: Morbidly obese gentleman, appears calm and comfortable  Respiratory system: Clear to auscultation. Respiratory effort normal. Cardiovascular system: S1 & S2 heard, RRR.  Gastrointestinal system: Soft, nontender, nondistended, bowel sounds positive. Central nervous system: Alert and oriented. No focal neurological deficits. Extremities: No edema, no cyanosis, pulses intact and symmetrical. Psychiatry: Judgement and insight appear normal. Mood & affect appropriate.    DVT prophylaxis: SCDs-we will resume Xarelto if no more hemoptysis Code Status: Full Family Communication: Wife was updated at bedside Disposition Plan:  Status is: Observation  The patient remains OBS appropriate and will d/c before 2 midnights.  Dispo: The patient is from: Home              Anticipated d/c is to: Home              Patient currently is medically stable to d/c.   Difficult to place patient No                 Level of care: Stepdown  All the records are reviewed and case discussed with Care Management/Social Worker. Management plans discussed with the patient, nursing and they are in agreement.  Consultants:   Cardiology  Procedures:  Antimicrobials:   Data Reviewed: I have personally reviewed following labs and imaging studies  CBC: Recent Labs  Lab 11/23/20 0806 11/24/20 0633 11/25/20 1958 11/26/20 0219  WBC 5.7 5.6 6.8 6.3  NEUTROABS  --  3.0 3.9  --   HGB 14.2 13.6 14.6 13.8  HCT 41.6 40.3 43.4 39.7  MCV 96.7 98.3 97.7 97.3  PLT 141* 138* 155 696*   Basic Metabolic Panel: Recent Labs  Lab 11/23/20 0806 11/24/20 0633 11/25/20 0405 11/25/20 1958 11/26/20 0219  NA 134* 136 135 138 140  K 3.8 3.6 3.6 3.7 4.0  CL 96* 97* 97* 96* 98  CO2 29 30 28 31  33*  GLUCOSE 218* 173* 170* 213* 140*  BUN 41* 36* 34* 36* 36*  CREATININE 1.97* 1.84* 1.73* 1.84* 1.74*  CALCIUM 8.7* 8.7* 8.8* 8.7* 8.5*  MG 1.4* 1.8 1.6* 2.3  --    GFR: Estimated  Creatinine Clearance: 74.7 mL/min (A) (by C-G formula based on SCr of 1.74 mg/dL (H)). Liver Function Tests: Recent Labs  Lab 11/23/20 0806 11/25/20 1958  AST 37 40  ALT 45* 43  ALKPHOS 253* 275*  BILITOT 1.0 1.3*  PROT 7.7 8.7*  ALBUMIN 3.5 3.8   Recent Labs  Lab 11/23/20 0806  LIPASE 47   No results for input(s): AMMONIA in the last 168 hours. Coagulation Profile: No results for input(s): INR, PROTIME in the last 168 hours. Cardiac Enzymes: No results for input(s): CKTOTAL, CKMB, CKMBINDEX, TROPONINI in the last 168 hours. BNP (last 3 results) No results for input(s): PROBNP in the last 8760 hours. HbA1C: Recent Labs    11/24/20 0633  HGBA1C 7.8*   CBG: Recent Labs  Lab 11/24/20 1626 11/24/20 2105 11/25/20 0720 11/25/20 1119 11/25/20 2320  GLUCAP 197* 225* 173* 196* 215*  Lipid Profile: No results for input(s): CHOL, HDL, LDLCALC, TRIG, CHOLHDL, LDLDIRECT in the last 72 hours. Thyroid Function Tests: Recent Labs    11/24/20 0633  TSH 2.250   Anemia Panel: No results for input(s): VITAMINB12, FOLATE, FERRITIN, TIBC, IRON, RETICCTPCT in the last 72 hours. Sepsis Labs: No results for input(s): PROCALCITON, LATICACIDVEN in the last 168 hours.  Recent Results (from the past 240 hour(s))  Resp Panel by RT-PCR (Flu A&B, Covid) Nasopharyngeal Swab     Status: None   Collection Time: 11/23/20  9:30 AM   Specimen: Nasopharyngeal Swab; Nasopharyngeal(NP) swabs in vial transport medium  Result Value Ref Range Status   SARS Coronavirus 2 by RT PCR NEGATIVE NEGATIVE Final    Comment: (NOTE) SARS-CoV-2 target nucleic acids are NOT DETECTED.  The SARS-CoV-2 RNA is generally detectable in upper respiratory specimens during the acute phase of infection. The lowest concentration of SARS-CoV-2 viral copies this assay can detect is 138 copies/mL. A negative result does not preclude SARS-Cov-2 infection and should not be used as the sole basis for treatment or other  patient management decisions. A negative result may occur with  improper specimen collection/handling, submission of specimen other than nasopharyngeal swab, presence of viral mutation(s) within the areas targeted by this assay, and inadequate number of viral copies(<138 copies/mL). A negative result must be combined with clinical observations, patient history, and epidemiological information. The expected result is Negative.  Fact Sheet for Patients:  EntrepreneurPulse.com.au  Fact Sheet for Healthcare Providers:  IncredibleEmployment.be  This test is no t yet approved or cleared by the [place] FDA and  has been authorized for detection and/or diagnosis of SARS-CoV-2 by FDA under an Emergency Use Authorization (EUA). This EUA will remain  in effect (meaning this test can be used) for the duration of the COVID-19 declaration under Section 564(b)(1) of the Act, 21 U.S.C.section 360bbb-3(b)(1), unless the authorization is terminated  or revoked sooner.       Influenza A by PCR NEGATIVE NEGATIVE Final   Influenza B by PCR NEGATIVE NEGATIVE Final    Comment: (NOTE) The Xpert Xpress SARS-CoV-2/FLU/RSV plus assay is intended as an aid in the diagnosis of influenza from Nasopharyngeal swab specimens and should not be used as a sole basis for treatment. Nasal washings and aspirates are unacceptable for Xpert Xpress SARS-CoV-2/FLU/RSV testing.  Fact Sheet for Patients: EntrepreneurPulse.com.au  Fact Sheet for Healthcare Providers: IncredibleEmployment.be  This test is not yet approved or cleared by the [place] FDA and has been authorized for detection and/or diagnosis of SARS-CoV-2 by FDA under an Emergency Use Authorization (EUA). This EUA will remain in effect (meaning this test can be used) for the duration of the COVID-19 declaration under Section 564(b)(1) of the Act, 21 U.S.C. section  360bbb-3(b)(1), unless the authorization is terminated or revoked.  Performed at [facility], [address] [place], [place] [address]   Resp Panel by RT-PCR (Flu A&B, Covid) Nasopharyngeal Swab     Status: None   Collection Time: 11/25/20  9:27 PM   Specimen: Nasopharyngeal Swab; Nasopharyngeal(NP) swabs in vial transport medium  Result Value Ref Range Status   SARS Coronavirus 2 by RT PCR NEGATIVE NEGATIVE Final    Comment: (NOTE) SARS-CoV-2 target nucleic acids are NOT DETECTED.  The SARS-CoV-2 RNA is generally detectable in upper respiratory specimens during the acute phase of infection. The lowest concentration of SARS-CoV-2 viral copies this assay can detect is 138 copies/mL. A negative result does not preclude SARS-Cov-2 infection and  should not be used as the sole basis for treatment or other patient management decisions. A negative result may occur with  improper specimen collection/handling, submission of specimen other than nasopharyngeal swab, presence of viral mutation(s) within the areas targeted by this assay, and inadequate number of viral copies(<138 copies/mL). A negative result must be combined with clinical observations, patient history, and epidemiological information. The expected result is Negative.  Fact Sheet for Patients:  EntrepreneurPulse.com.au  Fact Sheet for Healthcare Providers:  IncredibleEmployment.be  This test is no t yet approved or cleared by the [place] FDA and  has been authorized for detection and/or diagnosis of SARS-CoV-2 by FDA under an Emergency Use Authorization (EUA). This EUA will remain  in effect (meaning this test can be used) for the duration of the COVID-19 declaration under Section 564(b)(1) of the Act, 21 U.S.C.section 360bbb-3(b)(1), unless the authorization is terminated  or revoked sooner.       Influenza A by PCR NEGATIVE NEGATIVE Final   Influenza B by PCR  NEGATIVE NEGATIVE Final    Comment: (NOTE) The Xpert Xpress SARS-CoV-2/FLU/RSV plus assay is intended as an aid in the diagnosis of influenza from Nasopharyngeal swab specimens and should not be used as a sole basis for treatment. Nasal washings and aspirates are unacceptable for Xpert Xpress SARS-CoV-2/FLU/RSV testing.  Fact Sheet for Patients: EntrepreneurPulse.com.au  Fact Sheet for Healthcare Providers: IncredibleEmployment.be  This test is not yet approved or cleared by the [place] FDA and has been authorized for detection and/or diagnosis of SARS-CoV-2 by FDA under an Emergency Use Authorization (EUA). This EUA will remain in effect (meaning this test can be used) for the duration of the COVID-19 declaration under Section 564(b)(1) of the Act, 21 U.S.C. section 360bbb-3(b)(1), unless the authorization is terminated or revoked.  Performed at [facility], [address] [place], [place] [address]   MRSA PCR Screening     Status: None   Collection Time: 11/25/20 11:21 PM   Specimen: Nasopharyngeal  Result Value Ref Range Status   MRSA by PCR NEGATIVE NEGATIVE Final    Comment:        The GeneXpert MRSA Assay (FDA approved for NASAL specimens only), is one component of a comprehensive MRSA colonization surveillance program. It is not intended to diagnose MRSA infection nor to guide or monitor treatment for MRSA infections. Performed at [facility], [address] [place], [place] [address]      Radiology Studies: DG Chest Portable 1 View  Result Date: 11/25/2020 CLINICAL DATA:  Shortness of breath EXAM: PORTABLE CHEST 1 VIEW COMPARISON:  11/23/2020 FINDINGS: Cardiac shadow remains enlarged. Defibrillator is again seen. Increasing vascular congestion and parenchymal edema is noted. No bony abnormality is noted. IMPRESSION: Increased vascular congestion and parenchymal edema particularly in the right lung.  Electronically Signed   By: Inez Catalina M.D.   On: 11/25/2020 20:19    Scheduled Meds: . amiodarone  200 mg Oral BID  . atorvastatin  40 mg Oral QHS  . carvedilol  3.125 mg Oral BID WC  . Chlorhexidine Gluconate Cloth  6 each Topical Daily  . furosemide  60 mg Intravenous BID  . pantoprazole  40 mg Oral BID  . sacubitril-valsartan  1 tablet Oral BID  . torsemide  40 mg Oral Daily  . umeclidinium-vilanterol  1 puff Inhalation Daily   Continuous Infusions:   LOS: 0 days   Time spent: 40 minutes. More than 50% of the time was spent in counseling/coordination of  care  Lorella Nimrod, MD [facility]  If 7PM-7AM, please contact night-coverage [email or web address]  11/26/2020, 12:40 PM   This record has been created using Systems analyst. Errors have been sought and corrected,but may not always be located. Such creation errors do not reflect on the standard of care.

## 2020-11-26 NOTE — TOC Initial Note (Signed)
Transition of Care [facility]) - Initial/Assessment Note    Patient Details  Name: Oscar [name] MRN: [id number] Date of Birth: 03/04/1976  [facility] CM/SW Contact:    Ova Freshwater Phone Number: [phone] 11/26/2020, 5:12 PM  Clinical Narrative:                  Patient presents to [facility] due to SOB.  Patient was discharged on 3/27 and returned four hours later due to SOB. Patient stated he is not able to stay at home without a CPAP machine because he is unable to breathe.  Patient stated he did his sleep study at [facility] in [facility]. 2021 and was told he would receive his CPAP but it would be nine months before he receives it.  Patient stated he needs something to help hum breathe sooner than that.  CSW contacted [employer] and they arranged for the patient to get a trilogy.  Attending sign order form and progress note.  CSW reached out tot [place] is to d/c home with DME and [facility] tomorrow 11/27/2020.  Expected Discharge Plan: [place] Barriers to Discharge: Continued Medical Work up   Patient Goals and CMS Choice Patient states their goals for this hospitalization and ongoing recovery are:: To return home with DME to help him breathe.      Expected Discharge Plan and Services Expected Discharge Plan: [place] In-house Referral: Clinical Social Work   Post Acute Care Choice: [place] arrangements for the past 2 months: [place]                 DME Arranged: Other see comment (Trilogy) DME Agency: [employer] Date DME Agency Contacted: 11/26/20 Time DME Agency Contacted: [phone] Representative spoke with at DME Agency: [place]: RN [place] Agency: [place] ([place]) Date [place]: 11/26/20   Representative spoke with at [place]: Floydene Flock  Prior Living Arrangements/Services Living arrangements for the past 2 months: [place] with:: Significant Other Patient language and need for interpreter reviewed:: Yes Do you feel safe going back to the place where you live?: Yes      Need for Family Participation in Patient Care: No (Comment) Care giver support system in place?: Yes (comment)   Criminal Activity/Legal Involvement Pertinent to Current Situation/Hospitalization: No - Comment as needed  Activities of Daily Living Home Assistive Devices/Equipment: CPAP ADL Screening (condition at time of admission) Patient's cognitive ability adequate to safely complete daily activities?: Yes Is the patient deaf or have difficulty hearing?: Yes (R ear) Does the patient have difficulty seeing, even when wearing glasses/contacts?: No Does the patient have difficulty concentrating, remembering, or making decisions?: No Patient able to express need for assistance with ADLs?: Yes Does the patient have difficulty dressing or bathing?: No Independently performs ADLs?: Yes (appropriate for developmental age) Does the patient have difficulty walking or climbing stairs?: No Weakness of Legs: None Weakness of Arms/Hands: None  Permission Sought/Granted Permission sought to share information with : Family Supports Permission granted to share information with : Yes, Verbal Permission Granted  Share Information with NAME: [place] [phone]   [phone]           Emotional Assessment Appearance:: Appears older than stated age Attitude/Demeanor/Rapport: Engaged Affect (typically observed): Stable Orientation: : Oriented to Self,Oriented to Place,Oriented to  Time,Oriented to Situation Alcohol / Substance Use: Not Applicable Psych  Involvement: No (comment)  Admission diagnosis:  Pulmonary edema [J81.1] Flash pulmonary edema (HCC) [J81.0] Acute on chronic systolic congestive heart failure ([place]) [I50.23] Patient Active Problem List   Diagnosis Date Noted  . Pulmonary edema 11/25/2020  . Obesity,  Class III, BMI 40-49.9 (morbid obesity) ([place]) 11/24/2020  . Acute on chronic diastolic (congestive) heart failure ([place]) 11/24/2020  . Acute exacerbation of CHF (congestive heart failure) ([place]) 11/23/2020  . Hepatic cirrhosis, unspecified hepatic cirrhosis type, unspecified whether ascites present ([place]) 11/23/2020  . Type 2 diabetes mellitus with other diabetic neurological complication ([place]) 63/78/5885  . Obesity hypoventilation syndrome ([place]) 11/23/2020  . Atrial fibrillation with RVR ([place]) 11/23/2020  . ARF (acute renal failure) ([place]) 03/28/2020  . Acute on chronic combined systolic (congestive) and diastolic (congestive) heart failure ([place]) 02/27/2020  . COPD (chronic obstructive pulmonary disease) ([place]) 02/27/2020  . Sprain of foot 01/26/2020  . Foreign body granuloma of skin and subcutaneous tissue 05/26/2019  . Acute pancreatitis 05/19/2017  . Pancreatitis 09/12/2016  . Bronchitis 05/20/2016  . Constipation 11/08/2015  . Abdominal pain 11/02/2015  . Obstructive sleep apnea 07/31/2015  . Tobacco use 07/31/2015  . Diabetes ([place]) 07/10/2015  . Chronic systolic heart failure ([place]) 03/30/2015  . Cardiac pacemaker 03/30/2015  . Chronic respiratory failure with hypercapnia ([place]) 12/12/2014  . Elevated troponin   . Non-ischemic cardiomyopathy ([place])   . Hypoxemia   . Acute combined systolic and diastolic heart failure ([place])   . SVT (supraventricular tachycardia) ([place]) 12/09/2014  . NSTEMI (non-ST elevated myocardial infarction) ([place]) 12/09/2014  . Hypertension 12/09/2014   PCP:  [facility] [place]:   [place] (N), [place] [address] [place]) [place] [address] Phone: [phone] Fax: [phone]     Social Determinants of Health (SDOH) Interventions    Readmission Risk Interventions No flowsheet data found.

## 2020-11-26 NOTE — Consult Note (Signed)
CARDIOLOGY CONSULT NOTE               Patient ID: Oscar [name] MRN: [id number] DOB/AGE: 09-06-1975 45 y.o.  Admit date: 11/25/2020 Referring Physician Dr. Rupert Stacks hospitalist Primary Physician [place] Primary Cardiologist [facility] Reason for Consultation acute pulmonary edema congestive heart failure cardiomyopathy  HPI: Patient is a 45 year old morbidly obese black male recently admitted with acute on chronic heart failure treated aggressively wanted to be discharged yesterday but came back in less than 24 hours with acute pulmonary edema shortness of breath he states to been compliant with his medication but required aggressive respiratory support with BiPAP and diuretics inhalers and was admitted to ICU denies any significant chest pain denies any significant tachycardia patient had some mild hemoptysis of unclear significance his anticoagulants were held patient feels better now on the BiPAP does not have a CPAP machine at home still smokes.  Review of systems complete and found to be negative unless listed above     Past Medical History:  Diagnosis Date  . AICD (automatic cardioverter/defibrillator) present   . Asthma   . Cardiomyopathy ([place])   . CHF (congestive heart failure) ([place])   . Coronary artery disease   . Deafness in right ear   . Diabetes mellitus without complication ([place])   . Dilated cardiomyopathy ([place])   . Dysrhythmia    svt  . 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 GERD (gastroesophageal reflux disease)   . Hyperlipidemia   . Hypertension   . Hypoxemia   . Hypoxemia   . Mild obesity   . Myocardial infarction ([place])    [id number],1/82  . Pancreatitis   . Sleep apnea    osa    Past Surgical History:  Procedure Laterality Date  . CARDIAC CATHETERIZATION  12/11/2014   Procedure: RIGHT/LEFT HEART CATH AND CORONARY ANGIOGRAPHY;  Surgeon: Lorretta Harp, MD;  Location: [facility] CATH LAB;  Service: Cardiovascular;;  .  ICD LEAD REMOVAL N/A 03/30/2015   Procedure: ICD LEAD REMOVAL;  Surgeon: Marzetta Board, MD;  Location: [facility] ORS;  Service: Cardiovascular;  Laterality: N/A;  . IMPLANTABLE CARDIOVERTER DEFIBRILLATOR IMPLANT    . INSERT / REPLACE / REMOVE PACEMAKER    . LEFT HEART CATHETERIZATION WITH CORONARY ANGIOGRAM N/A 12/09/2014   Procedure: LEFT HEART CATHETERIZATION WITH CORONARY ANGIOGRAM;  Surgeon: Burnell Blanks, MD;  Location: [facility] CATH LAB;  Service: Cardiovascular;  Laterality: N/A;    Medications Prior to Admission  Medication Sig Dispense Refill Last Dose  . amiodarone (PACERONE) 200 MG tablet Take 200 mg by mouth 2 (two) times daily.   11/25/2020 at 1900  . atorvastatin (LIPITOR) 40 MG tablet Take 1 tablet (40 mg total) by mouth daily at 6 PM. (Patient taking differently: Take 40 mg by mouth at bedtime.) 90 tablet 3 11/25/2020 at 2230  . carvedilol (COREG) 3.125 MG tablet Take 1 tablet (3.125 mg total) by mouth 2 (two) times daily with a meal. 60 tablet 0 11/25/2020 at 1900  . empagliflozin (JARDIANCE) 10 MG TABS tablet Take 10 mg by mouth daily.   11/25/2020 at 0900  . pantoprazole (PROTONIX) 40 MG tablet Take 1 tablet (40 mg total) by mouth daily. 30 tablet 0 11/25/2020 at 0900  . rivaroxaban (XARELTO) 20 MG TABS tablet Take 20 mg by mouth daily with supper.   11/25/2020 at 0900  . sacubitril-valsartan (ENTRESTO) 97-103 MG Take 1 tablet by mouth 2 (two) times daily. 60 tablet  5 11/25/2020 at 1900  . tiotropium (SPIRIVA) 18 MCG inhalation capsule Place 18 mcg into inhaler and inhale daily.   11/25/2020 at 0900  . umeclidinium-vilanterol (ANORO ELLIPTA) 62.5-25 MCG/INH AEPB Inhale 1 puff into the lungs daily in the afternoon.   11/25/2020 at 0900  . clopidogrel (PLAVIX) 75 MG tablet Take 1 tablet (75 mg total) by mouth daily. (Patient not taking: No sig reported) 30 tablet 11 Not Taking at Unknown  . torsemide (DEMADEX) 20 MG tablet Take 40 mg by mouth daily. (Patient not taking: Reported on 11/25/2020)    Not Taking at Unknown time   Social History   Socioeconomic History  . Marital status: Single    Spouse name: Not on file  . Number of children: Not on file  . Years of education: Not on file  . Highest education level: Not on file  Occupational History  . Occupation: unemployed  Tobacco Use  . Smoking status: Current Every Day Smoker    Packs/day: 0.50    Years: 24.00    Pack years: 12.00    Types: Cigarettes  . Smokeless tobacco: Never Used  . Tobacco comment: 4/5 Smoking 5 cigs a day  Vaping Use  . Vaping Use: Never used  Substance and Sexual Activity  . Alcohol use: No  . Drug use: No  . Sexual activity: Yes  Other Topics Concern  . Not on file  Social History Narrative  . Not on file   Social Determinants of Health   Financial Resource Strain: Not on file  Food Insecurity: Not on file  Transportation Needs: Not on file  Physical Activity: Not on file  Stress: Not on file  Social Connections: Not on file  Intimate Partner Violence: Not on file    Family History  Problem Relation Age of Onset  . Hypertension Mother   . Congestive Heart Failure Mother   . Hypertension Sister   . Diabetes Sister   . Pancreatitis Sister   . COPD Sister   . Pancreatitis Brother   . Anemia Neg Hx   . Arrhythmia Neg Hx   . Asthma Neg Hx   . Clotting disorder Neg Hx   . Fainting Neg Hx   . Heart attack Neg Hx   . Heart disease Neg Hx   . Heart failure Neg Hx   . Hyperlipidemia Neg Hx       Review of systems complete and found to be negative unless listed above      PHYSICAL EXAM  General: Well developed, well nourished, in no acute distress HEENT:  Normocephalic and atramatic Neck:  No JVD.  Lungs: Clear bilaterally to auscultation and percussion. Heart: HRRR . Normal S1 and S2 without gallops or murmurs.  Abdomen: Bowel sounds are positive, abdomen soft and non-tender  Msk:  Back normal, normal gait. Normal strength and tone for age. Extremities: No clubbing,  cyanosis or edema.   Neuro: Alert and oriented X 3. Psych:  Good affect, responds appropriately  Labs:   Lab Results  Component Value Date   WBC 6.3 11/26/2020   HGB 13.8 11/26/2020   HCT 39.7 11/26/2020   MCV 97.3 11/26/2020   PLT 140 (L) 11/26/2020    Recent Labs  Lab 11/25/20 1958 11/26/20 0219  NA 138 140  K 3.7 4.0  CL 96* 98  CO2 31 33*  BUN 36* 36*  CREATININE 1.84* 1.74*  CALCIUM 8.7* 8.5*  PROT 8.7*  --   BILITOT 1.3*  --  ALKPHOS 275*  --   ALT 43  --   AST 40  --   GLUCOSE 213* 140*   Lab Results  Component Value Date   CKTOTAL 257 (H) 12/19/2012   CKMB 2.0 08/11/2014   TROPONINI 0.07 (HH) 02/04/2018    Lab Results  Component Value Date   CHOL 247 (H) 09/12/2016   CHOL 234 (H) 07/07/2016   CHOL 272 (A) 10/18/2015   Lab Results  Component Value Date   HDL 31 (L) 09/12/2016   HDL 31 (L) 07/07/2016   HDL 34 (A) 10/18/2015   Lab Results  Component Value Date   LDLCALC 177 (H) 09/12/2016   LDLCALC 179 (H) 07/07/2016   LDLCALC 215 10/18/2015   Lab Results  Component Value Date   TRIG 157 (H) 05/19/2017   TRIG 193 (H) 09/12/2016   TRIG 121 07/07/2016   Lab Results  Component Value Date   CHOLHDL 8.0 09/12/2016   CHOLHDL 7.5 07/07/2016   CHOLHDL 8.4 12/10/2014   No results found for: LDLDIRECT    Radiology: DG Chest 2 View  Result Date: 11/23/2020 CLINICAL DATA:  Shortness of breath EXAM: CHEST - 2 VIEW COMPARISON:  03/28/2020 FINDINGS: Cardiomegaly. New interstitial prominence diffusely. Dual-chamber ICD/pacer leads in unchanged position. No effusion or pneumothorax seen. IMPRESSION: CHF pattern. Electronically Signed   By: Monte Fantasia M.D.   On: 11/23/2020 08:45   DG Chest Portable 1 View  Result Date: 11/25/2020 CLINICAL DATA:  Shortness of breath EXAM: PORTABLE CHEST 1 VIEW COMPARISON:  11/23/2020 FINDINGS: Cardiac shadow remains enlarged. Defibrillator is again seen. Increasing vascular congestion and parenchymal edema is noted.  No bony abnormality is noted. IMPRESSION: Increased vascular congestion and parenchymal edema particularly in the right lung. Electronically Signed   By: Inez Catalina M.D.   On: 11/25/2020 20:19   ECHOCARDIOGRAM COMPLETE  Result Date: 11/23/2020    ECHOCARDIOGRAM REPORT   Patient Name:   Oscar [name] Date of Exam: 11/23/2020 Medical Rec #:  [id number]        Height:       66.0 in Accession #:    [id number]       Weight:       308.6 lb Date of Birth:  05-Oct-1975       BSA:          2.405 m Patient Age:    25 years         BP:           113/77 mmHg Patient Gender: M                HR:           112 bpm. Exam Location:  [facility] Procedure: 2D Echo, Color Doppler, Cardiac Doppler and Intracardiac            Opacification Agent Indications:     Dyspnea R06.00  History:         Patient has prior history of Echocardiogram examinations. CHF,                  Defibrillator; Risk Factors:Sleep Apnea, Hypertension, Diabetes                  and Dyslipidemia.  Sonographer:     Charmayne Sheer RDCS (AE) Referring Phys:  [id number] AMY N [facility] Diagnosing Phys: Yolonda Kida MD  Sonographer Comments: Technically difficult study due to poor echo windows. Image acquisition challenging due to patient body habitus. IMPRESSIONS  1. Severely depressed LVF.  2. Left ventricular ejection fraction, by estimation, is <20%. The left ventricle has severely decreased function. The left ventricle demonstrates global hypokinesis. The left ventricular internal cavity size was severely dilated. There is moderate left  ventricular hypertrophy. Left ventricular diastolic parameters are consistent with Grade I diastolic dysfunction (impaired relaxation).  3. Right ventricular systolic function is moderately reduced. The right ventricular size is moderately enlarged. Mildly increased right ventricular wall thickness. There is moderately elevated pulmonary artery systolic pressure.  4. Left atrial size was mild to moderately dilated.  5. Right atrial size  was mild to moderately dilated.  6. The mitral valve is grossly normal. Mild mitral valve regurgitation.  7. Tricuspid valve regurgitation is moderate to severe.  8. The aortic valve is grossly normal. Aortic valve regurgitation is trivial. Mild aortic valve sclerosis is present, with no evidence of aortic valve stenosis. FINDINGS  Left Ventricle: Left ventricular ejection fraction, by estimation, is <20%. The left ventricle has severely decreased function. The left ventricle demonstrates global hypokinesis. Definity contrast agent was given IV to delineate the left ventricular endocardial borders. The left ventricular internal cavity size was severely dilated. There is moderate left ventricular hypertrophy. Left ventricular diastolic parameters are consistent with Grade I diastolic dysfunction (impaired relaxation). Right Ventricle: The right ventricular size is moderately enlarged. Mildly increased right ventricular wall thickness. Right ventricular systolic function is moderately reduced. There is moderately elevated pulmonary artery systolic pressure. Left Atrium: Left atrial size was mild to moderately dilated. Right Atrium: Right atrial size was mild to moderately dilated. Pericardium: There is no evidence of pericardial effusion. Mitral Valve: The mitral valve is grossly normal. Mild mitral valve regurgitation. MV peak gradient, 8.1 mmHg. The mean mitral valve gradient is 4.0 mmHg. Tricuspid Valve: The tricuspid valve is grossly normal. Tricuspid valve regurgitation is moderate to severe. Aortic Valve: The aortic valve is grossly normal. Aortic valve regurgitation is trivial. Mild aortic valve sclerosis is present, with no evidence of aortic valve stenosis. Aortic valve mean gradient measures 4.0 mmHg. Aortic valve peak gradient measures 8.0 mmHg. Aortic valve area, by VTI measures 3.12 cm. Pulmonic Valve: The pulmonic valve was normal in structure. Pulmonic valve regurgitation is not visualized. Aorta: The  ascending aorta was not well visualized. IAS/Shunts: The interatrial septum was not assessed. Additional Comments: Severely depressed LVF. A device lead is visualized.  LEFT VENTRICLE PLAX 2D LVIDd:         7.90 cm      Diastology LVIDs:         5.90 cm      LV e' lateral:   5.22 cm/s LV PW:         1.60 cm      LV E/e' lateral: 21.6 LV IVS:        1.40 cm LVOT diam:     3.10 cm LV SV:         75 LV SV Index:   31 LVOT Area:     7.55 cm  LV Volumes (MOD) LV vol d, MOD A2C: 452.0 ml LV vol d, MOD A4C: 347.0 ml LV vol s, MOD A2C: 366.0 ml LV vol s, MOD A4C: 338.0 ml LV SV MOD A2C:     86.0 ml LV SV MOD A4C:     347.0 ml LV SV MOD BP:      53.5 ml RIGHT VENTRICLE RV Basal diam:  3.50 cm LEFT ATRIUM  Index       RIGHT ATRIUM           Index LA diam:        6.30 cm  2.62 cm/m  RA Area:     17.00 cm LA Vol (A2C):   91.5 ml  38.05 ml/m RA Volume:   43.20 ml  17.96 ml/m LA Vol (A4C):   146.0 ml 60.71 ml/m LA Biplane Vol: 117.0 ml 48.65 ml/m  AORTIC VALVE                   PULMONIC VALVE AV Area (Vmax):    3.46 cm    PV Vmax:       0.91 m/s AV Area (Vmean):   3.69 cm    PV Vmean:      62.100 cm/s AV Area (VTI):     3.12 cm    PV VTI:        0.172 m AV Vmax:           141.00 cm/s PV Peak grad:  3.3 mmHg AV Vmean:          94.000 cm/s PV Mean grad:  2.0 mmHg AV VTI:            0.242 m AV Peak Grad:      8.0 mmHg AV Mean Grad:      4.0 mmHg LVOT Vmax:         64.60 cm/s LVOT Vmean:        45.900 cm/s LVOT VTI:          0.100 m LVOT/AV VTI ratio: 0.41  AORTA Ao Root diam: 4.00 cm MITRAL VALVE MV Area (PHT): 7.47 cm     SHUNTS MV Area VTI:   4.49 cm     Systemic VTI:  0.10 m MV Peak grad:  8.1 mmHg     Systemic Diam: 3.10 cm MV Mean grad:  4.0 mmHg MV Vmax:       1.42 m/s MV Vmean:      86.9 cm/s MV Decel Time: 102 msec MV E velocity: 113.00 cm/s Yolonda Kida MD Electronically signed by Yolonda Kida MD Signature Date/Time: 11/23/2020/4:13:15 PM    Final    CT Angio Chest/Abd/Pel for Dissection W  and/or Wo Contrast  Addendum Date: 11/23/2020   ADDENDUM REPORT: 11/23/2020 09:54 ADDENDUM: Add to addendum of chest CT angiogram: Enlargement of the main pulmonary outflow tract, a finding indicative of pulmonary arterial hypertension. Electronically Signed   By: Lowella Grip III M.D.   On: 11/23/2020 09:54   Result Date: 11/23/2020 CLINICAL DATA:  Chest and abdominal pain radiating toward back. Shortness of breath EXAM: CT ANGIOGRAPHY CHEST, ABDOMEN AND PELVIS TECHNIQUE: Non-contrast CT of the chest was initially obtained. Multidetector CT imaging through the chest, abdomen and pelvis was performed using the standard protocol during bolus administration of intravenous contrast. Multiplanar reconstructed images and MIPs were obtained and reviewed to evaluate the vascular anatomy. CONTRAST:  138mL OMNIPAQUE IOHEXOL 350 MG/ML SOLN COMPARISON:  Chest radiograph November 23, 2020; CT abdomen and pelvis March 28, 2020. Chest radiograph May 28, 2017 FINDINGS: CTA CHEST FINDINGS Cardiovascular: No evident intramural hematoma in the thoracic aorta on noncontrast enhanced study. There is no evident mediastinal hematoma. There is no appreciable thoracic aortic aneurysm or dissection. Visualized great vessels appear unremarkable. Note that the right innominate and left common carotid arteries arise as a common trunk, an anatomic variant. There is no appreciable aneurysm or dissection involving  the visualized great vessels. No appreciable atherosclerotic plaque noted in the visualized great vessels. Rather minimal atherosclerotic calcification noted in the aorta. No evidence of aortic ulceration in the thoracic region. No appreciable pulmonary embolus. Main pulmonary outflow tract measures 4.0 cm, enlarged. Heart is enlarged with pacemaker leads attached to the right atrium and right ventricle. No pericardial effusion or pericardial thickening. Mediastinum/Nodes: Visualized thyroid appears unremarkable. There is  lymph node enlargement slightly to the right of the lower trachea measuring 1.5 x 1.5 cm. A lymph node in the aortopulmonary window region measures 1.2 x 1.1 cm. There are scattered subcentimeter mediastinal lymph nodes as well. No esophageal lesions are evident. Lungs/Pleura: Scattered bullae are noted in the apices as well as throughout portions of the right lower lobe. There is no edema or consolidation. No pleural effusions. There are areas of mosaic attenuation in the lower lobe regions bilaterally. No pneumothorax. Trachea and major bronchial structures appear normal. Musculoskeletal: No acute appearing fracture or dislocation. There is mild anterior wedging of the T8, T9, and T10 vertebral bodies, stable. No blastic or lytic bone lesions. No evident chest wall lesions. Review of the MIP images confirms the above findings. CTA ABDOMEN AND PELVIS FINDINGS VASCULAR Aorta: There is no abdominal aortic aneurysm or dissection. There is modest plaque in the distal aorta, not causing hemodynamically significant obstruction. Celiac: Celiac artery and its branches are widely patent. No aneurysm or dissection involving these vessels. SMA: Superior mesenteric artery and its branches are widely patent. No aneurysm or dissection involving these vessels. Renals: There are 2 renal arteries on each side. There is no appreciable aneurysm or dissection involving renal arteries or respective branches. No fibromuscular dysplasia. No appreciable atherosclerotic irregularity in these vessels. IMA: Inferior mesenteric artery and its branches are widely patent. No aneurysm or dissection involving these vessels. Inflow: Minimal atherosclerotic calcification is noted at the origins of each common iliac artery. There is also calcification in the proximal left internal iliac artery. Other major arterial pelvic vessels are widely patent. No aneurysm or dissection involving pelvic arterial vessels noted. Visualized profunda femoral and  superficial femoral arteries are patent without aneurysm or dissection involving these vessels. Veins: No obvious venous abnormality within the limitations of this arterial phase study. Review of the MIP images confirms the above findings. NON-VASCULAR Hepatobiliary: Liver measures 20.9 cm in length. Subtle irregularity along the liver capsule again noted. No focal liver lesions are appreciable. There is questionable thickening of the gallbladder wall. No evident biliary duct dilatation. Pancreas: No evident pancreatic mass or inflammatory focus. Spleen: No splenic lesions are appreciable. Adrenals/Urinary Tract: Adrenals bilaterally appear normal. No evident renal mass or hydronephrosis on either side. There is no appreciable renal or ureteral calculus on either side. Urinary bladder is midline with wall thickness within normal limits. Stomach/Bowel: There is no appreciable bowel wall or mesenteric thickening. The terminal ileum appears normal. There is no evident bowel obstruction. Appendix appears normal. No evident free air or portal venous air. Lymphatic: There are subcentimeter inguinal lymph nodes bilaterally, considered nonspecific and stable. There is a lymph node anterior to the inferior vena cava in the periportal region with a short axis diameter of 1.4 cm, stable. Subcentimeter retroperitoneal lymph nodes are stable. No new lymph node enlargement evident. Reproductive: Prostate prominent but stable. Seminal vesicles appear normal. Prostate abuts the inferior aspect of the bladder, a stable finding. Other: No evident abscess or ascites in the abdomen or pelvis. There is a focal umbilical hernia containing fat but no  bowel, unchanged from prior study. Musculoskeletal: Degenerative changes noted in the lumbar spine. There are no blastic or lytic bone lesions. No intramuscular lesions evident. Review of the MIP images confirms the above findings. IMPRESSION: CT angiogram chest: 1. No thoracic aortic  aneurysm or dissection. Minimal aortic atherosclerosis. Visualized great vessels appear unremarkable. 2. Scattered areas of bullous disease, more severe on the right than on the left. Scattered areas of mosaic attenuation in the lower lobes likely represents underlying small airways obstructive disease. No edema or consolidation. No pleural effusions. 3. Prominent inferior pretracheal and aortopulmonary window lymph nodes of uncertain etiology. 4. Pacemaker leads attached to right heart. There is cardiomegaly. No pericardial effusion. 5.  No evident pulmonary embolus. CT angiogram abdomen; CT angiogram pelvis: 1. Mild atherosclerotic plaque in the aorta and proximal common iliac arteries. There is also atherosclerotic plaque in the internal iliac artery. No hemodynamically significant obstruction evident in the aorta, major mesenteric, and major pelvic arterial vessels. 2. No aneurysm or dissection involving the aorta, major mesenteric, and major pelvic arterial vessels. No fibromuscular dysplasia. 3. The appearance of the liver suggests a degree of cirrhosis. A prominent pericaval lymph node is stable and may be secondary to underlying hepatic cirrhosis. 4. Question a degree of gallbladder wall thickening. This finding may warrant ultrasound of the gallbladder to further assess. 5. No evident bowel wall thickening or bowel obstruction. No abscess in the abdomen or pelvis. Appendix unremarkable. 6. No renal or ureteral calculus. No hydronephrosis. Urinary bladder wall thickness normal. 7. Prominent prostate, stable. This finding may warrant correlation with PSA. 8.  Stable umbilical hernia containing fat but no bowel. Aortic Atherosclerosis (ICD10-I70.0). Electronically Signed: By: Lowella Grip III M.D. On: 11/23/2020 09:47    EKG: Normal sinus rhythm 75 left bundle branch block nonspecific ST-T wave changes  ASSESSMENT AND PLAN:  Acute on chronic systolic congestive heart failure Severe nonischemic  cardiomyopathy Obstructive sleep apnea Obesity Smoking Hypertension GERD Hyperlipidemia Status post AICD placement History of atrial fibrillation Shortness of breath Hemoptysis COPD Chronic renal sufficiency stage IV  Plan Admit ICU level care for respiratory support Agree with BiPAP for dyspnea respiratory failure Aggressive diuretic therapy Continue Entresto for cardiomyopathy heart failure Lipitor therapy for hyperlipidemia Coreg 6.25 twice a day Agree with holding Xarelto because of hemoptysis Continue amiodarone 200 mg daily for atrial fibrillation Maintain medical therapy for GERD symptoms Continue IV Lasix twice a day Inhalers for COPD type symptoms Advised patient to refrain from tobacco abuse Nephrology input for renal insufficiency  Signed: Yolonda Kida MD 11/26/2020, 7:04 AM

## 2020-11-26 NOTE — Progress Notes (Signed)
Patient continues to exhibit signs of hypercapnia associated with chronic respiratory failure secondary to morbid obesity causing OHS and a restriction to the thoracic cage along with severe COPD.  Interruption or failure to provide NIV would quickly lead to exacerbation of the patient's condition, hospital admission, and likely harm to the patient. Continued use is preferred.  The use of the NIV will treat patient's high PC02 levels and can reduce risk of exacerbations and future hospitalizations when used at night and during the day.  BiLevel/RAD has been tried previously and has proven ineffective at managing this patient's hypercapnia.  Ventilation is required to decrease the work of breathing and improve pulmonary status. Interruption of ventilator support would lead to decline of health status.  Patient is able to protect their airways and clear secretions on their own.

## 2020-11-26 NOTE — Progress Notes (Signed)
Pt placed on CPAP at this time per MD order.

## 2020-11-26 NOTE — Progress Notes (Signed)
   11/24/20 2245  Assess: MEWS Score  Resp (!) 22  Assess: MEWS Score  MEWS Temp 0  MEWS Systolic 0  MEWS Pulse 1  MEWS RR 1  MEWS LOC 0  MEWS Score 2  MEWS Score Color Yellow  Assess: if the MEWS score is Yellow or Red  Were vital signs taken at a resting state? Yes  Focused Assessment No change from prior assessment  Early Detection of Sepsis Score *See Row Information* Low  MEWS guidelines implemented *See Row Information* Yes  Treat  MEWS Interventions Administered scheduled meds/treatments;Escalated (See documentation below) (pt in bipap)  Pain Scale 0-10  Pain Score 0  Take Vital Signs  Increase Vital Sign Frequency  Yellow: Q 2hr X 2 then Q 4hr X 2, if remains yellow, continue Q 4hrs  Escalate  MEWS: Escalate Yellow: discuss with charge nurse/RN and consider discussing with provider and RRT  Notify: Charge Nurse/RN  Name of Charge Nurse/RN Company secretary, RN  Date Charge Nurse/RN Notified 11/25/20  Time Charge Nurse/RN Notified 2300  Document  Patient Outcome Stabilized after interventions  Inserted for [employer] RN

## 2020-11-27 DIAGNOSIS — J810 Acute pulmonary edema: Secondary | ICD-10-CM

## 2020-11-27 LAB — CBC
HCT: 39.4 % (ref 39.0–52.0)
Hemoglobin: 13.6 g/dL (ref 13.0–17.0)
MCH: 33.6 pg (ref 26.0–34.0)
MCHC: 34.5 g/dL (ref 30.0–36.0)
MCV: 97.3 fL (ref 80.0–100.0)
Platelets: 126 10*3/uL — ABNORMAL LOW (ref 150–400)
RBC: 4.05 MIL/uL — ABNORMAL LOW (ref 4.22–5.81)
RDW: 13 % (ref 11.5–15.5)
WBC: 5.3 10*3/uL (ref 4.0–10.5)
nRBC: 0 % (ref 0.0–0.2)

## 2020-11-27 LAB — BASIC METABOLIC PANEL
Anion gap: 8 (ref 5–15)
BUN: 35 mg/dL — ABNORMAL HIGH (ref 6–20)
CO2: 33 mmol/L — ABNORMAL HIGH (ref 22–32)
Calcium: 8.5 mg/dL — ABNORMAL LOW (ref 8.9–10.3)
Chloride: 98 mmol/L (ref 98–111)
Creatinine, Ser: 1.67 mg/dL — ABNORMAL HIGH (ref 0.61–1.24)
GFR, Estimated: 51 mL/min — ABNORMAL LOW (ref >60)
Glucose, Bld: 212 mg/dL — ABNORMAL HIGH (ref 70–99)
Potassium: 3.6 mmol/L (ref 3.5–5.1)
Sodium: 139 mmol/L (ref 135–145)

## 2020-11-27 MED ORDER — TORSEMIDE 40 MG PO TABS: 40.0000 mg | ORAL_TABLET | Freq: Two times a day (BID) | ORAL | 1 refills | Status: DC

## 2020-11-27 NOTE — Plan of Care (Signed)
  Problem: Education: Goal: Knowledge of General Education information will improve Description: Including pain rating scale, medication(s)/side effects and non-pharmacologic comfort measures 11/27/2020 1159 by Cristela Blue, RN Outcome: Progressing 11/27/2020 1159 by Cristela Blue, RN Outcome: Progressing   Problem: Health Behavior/Discharge Planning: Goal: Ability to manage health-related needs will improve 11/27/2020 1159 by Cristela Blue, RN Outcome: Progressing 11/27/2020 1159 by Cristela Blue, RN Outcome: Progressing   Problem: Clinical Measurements: Goal: Ability to maintain clinical measurements within normal limits will improve 11/27/2020 1159 by Cristela Blue, RN Outcome: Progressing 11/27/2020 1159 by Cristela Blue, RN Outcome: Progressing Goal: Will remain free from infection 11/27/2020 1159 by Cristela Blue, RN Outcome: Progressing 11/27/2020 1159 by Cristela Blue, RN Outcome: Progressing Goal: Diagnostic test results will improve 11/27/2020 1159 by Cristela Blue, RN Outcome: Progressing 11/27/2020 1159 by Cristela Blue, RN Outcome: Progressing Goal: Respiratory complications will improve 11/27/2020 1159 by Cristela Blue, RN Outcome: Progressing 11/27/2020 1159 by Cristela Blue, RN Outcome: Progressing Goal: Cardiovascular complication will be avoided 11/27/2020 1159 by Cristela Blue, RN Outcome: Progressing 11/27/2020 1159 by Cristela Blue, RN Outcome: Progressing   Problem: Activity: Goal: Risk for activity intolerance will decrease 11/27/2020 1159 by Cristela Blue, RN Outcome: Progressing 11/27/2020 1159 by Cristela Blue, RN Outcome: Progressing   Problem: Nutrition: Goal: Adequate nutrition will be maintained 11/27/2020 1159 by Cristela Blue, RN Outcome: Progressing 11/27/2020 1159 by Cristela Blue, RN Outcome: Progressing   Problem: Coping: Goal: Level of anxiety will decrease 11/27/2020 1159 by Cristela Blue, RN Outcome:  Progressing 11/27/2020 1159 by Cristela Blue, RN Outcome: Progressing   Problem: Elimination: Goal: Will not experience complications related to bowel motility 11/27/2020 1159 by Cristela Blue, RN Outcome: Progressing 11/27/2020 1159 by Cristela Blue, RN Outcome: Progressing Goal: Will not experience complications related to urinary retention 11/27/2020 1159 by Cristela Blue, RN Outcome: Progressing 11/27/2020 1159 by Cristela Blue, RN Outcome: Progressing   Problem: Pain Managment: Goal: General experience of comfort will improve 11/27/2020 1159 by Cristela Blue, RN Outcome: Progressing 11/27/2020 1159 by Cristela Blue, RN Outcome: Progressing   Problem: Safety: Goal: Ability to remain free from injury will improve 11/27/2020 1159 by Cristela Blue, RN Outcome: Progressing 11/27/2020 1159 by Cristela Blue, RN Outcome: Progressing   Problem: Skin Integrity: Goal: Risk for impaired skin integrity will decrease 11/27/2020 1159 by Cristela Blue, RN Outcome: Progressing 11/27/2020 1159 by Cristela Blue, RN Outcome: Progressing

## 2020-11-27 NOTE — Progress Notes (Addendum)
This RN provided discharge instructions and teaching to the patient. The patient verbalized and demonstrated understanding of provided instructions. All outstanding questions resolved.   R arm PIV removed. Cannula intact. Pt tolerated well. All belongings packed and in tow. Volunteer to transport patient to private vehicle via wheelchair for discharge.

## 2020-11-27 NOTE — TOC Progression Note (Signed)
Transition of Care [facility]) - Progression Note    Patient Details  Name: Oscar [name] MRN: [id number] Date of Birth: [birth year]  Transition of Care [facility]) CM/SW [place], [place] Phone Number: [phone] 11/27/2020, 12:22 PM  Clinical Narrative:     Revised progress note sent for trilogy for co-signature.  Plan is still for patient to d/c home with trilogy.  Expected Discharge Plan: [place] Barriers to Discharge: Continued Medical Work up  Expected Discharge Plan and Services Expected Discharge Plan: [place] In-house Referral: Clinical Social Work   Post Acute Care Choice: [place] arrangements for the past 2 months: Single Family Home                 DME Arranged: Other see comment (Trilogy) DME Agency: [employer] Date DME Agency Contacted: 11/26/20 Time DME Agency Contacted: [phone] Representative spoke with at DME Agency: [place]: RN [facility] Agency: [place] ([place]) Date [place]: 11/26/20   Representative spoke with at [place]: [place] (Post) Interventions    Readmission Risk Interventions No flowsheet data found.

## 2020-11-27 NOTE — Progress Notes (Signed)
Notified Sharion Settler, NP of pt complaining of soreness chest pain. Pt complained at shift change of chest pain related to CPAP pressure and coughing. Notified MD earlier and gave pt a break from CPAP and chest pain resolved. Currently, obtained EKG d/t chest pain, EKG was unremarkable. Pt currently taking a break from BiPAP, on 3 L [place]. No new orders at this time, continue to monitor.

## 2020-11-27 NOTE — TOC Transition Note (Signed)
Transition of Care [facility] [place]) - CM/SW Discharge Note   Patient Details  Name: Oscar [name] MRN: [id number] Date of Birth: [birth year]  [facility] of [facility]) CM/SW Contact:  Ova [place] Phone Number: [phone] 11/27/2020, 1:18 PM   Clinical Narrative:     Patient will d/c home with Trilogy supplied by [employer] [facility].  [employer] will deliver Trilogy this afternoon.  ICU RN/ Attending updated.  Patient's significant other [employer] [phone], will pick him up.    Final next level of care: [place] Barriers to Discharge: Continued Medical Work up   Patient Goals and CMS Choice Patient states their goals for this hospitalization and ongoing recovery are:: To return home with DME to help him breathe.      Discharge Placement                       Discharge Plan and Services In-house Referral: Clinical Social Work   Post Acute Care Choice: Durable Medical Equipment,Home Health          DME Arranged: Other see comment (Trilogy) DME Agency: [employer] Date DME Agency Contacted: 11/26/20 Time DME Agency Contacted: [phone] Representative spoke with at DME Agency: [place]: RN [facility] Agency: [place] ([place]) Date [place]: 11/26/20   Representative spoke with at [place]: [place] ([place]) Interventions     Readmission Risk Interventions No flowsheet data found.

## 2020-11-27 NOTE — Progress Notes (Signed)
Patient continues to exhibit signs of hypercapnia associated with chronic respiratory failure secondary to severe COPD.  Interruption or failure to provide NIV would quickly lead to exacerbation of the patient's condition, hospital admission, and likely harm to the patient. Continued use is preferred.  The use of the NIV will treat patient's high PC02 levels and can reduce risk of exacerbations and future hospitalizations when used at night and during the day.  BiLevel/RAD has been tried previously and has proven ineffective at managing this patient's hypercapnia.  Ventilation is required to decrease the work of breathing and improve pulmonary status. Interruption of ventilator support would lead to decline of health status.  Patient is able to protect their airways and clear secretions on their own.    

## 2020-11-27 NOTE — Discharge Summary (Signed)
Physician Discharge Summary  Oscar [name] QZE:[id number] DOB: 05-13-1976 DOA: 11/25/2020  PCP: [facility] [place] date: 11/25/2020 Discharge date: 11/27/2020  Admitted From: Home Disposition: Home  Recommendations for Outpatient Follow-up:  1. Follow up with PCP in 1-2 weeks 2. Follow-up with cardiology 3. Follow-up with pulmonology 4. Please obtain BMP/CBC in one week 5. Please follow up on the following pending results: None  Home Health: Patient will resume prior Equipment/Devices: Home oxygen Discharge Condition: Stable CODE STATUS: Full Diet recommendation: Heart Healthy / Carb Modified   Brief/Interim Summary: Oscar Swingle Scogginsis a 45 y.o.malewith medical history significant forheart failure reduced ejection fraction( EF <20%), obesity, hypertension, status post dual-lead ICD, atrial fibrillation, obstructive sleep apnea has not received his CPAP yet, chronic respiratory failure secondary to COPD on 2 to 3 L of oxygen at home, prolonged QT, GERD, presents emergency department with chief concerns of shortness of breath. Patient was discharged few hours before coming back to ED.  Stating that he was taking his medications when he went home.  Chest x-ray with worsening pulmonary vascular congestion initially requiring BiPAP, responded well to IV Lasix and transition back to his home oxygen requirement. Apparently patient is on waiting list to get the CPAP to help him with breathing which is multifactorial, chronic respiratory failure secondary to severe COPD, CHF, obstructive sleep apnea and hypoventilation syndrome secondary to morbid obesity. Trilogy was ordered to help him with his respiratory status.  Cardiology was also reconsulted and they agree to continue with increased dose of torsemide from once daily to twice daily and a close follow-up with heart failure clinic.  Patient did had elevated troponin, no chest pain, troponin peaked at 190, most  likely secondary to demand ischemia with pulmonary edema.  He will continue with his home meds.  Patient has a history of paroxysmal atrial fibrillation.  Remained stable during this hospitalization.  We held his home dose of Xarelto initially for concern of 1 small hemoptysis per patient, no documentation.  Patient will resume amiodarone and Xarelto on discharge.  He will continue rest of his home meds and follow-up with his providers.  Discharge Diagnoses:  Principal Problem:   Pulmonary edema Active Problems:   Hypertension   Acute combined systolic and diastolic heart failure (HCC)   Chronic respiratory failure with hypercapnia (HCC)   Elevated troponin   Chronic systolic heart failure (HCC)   Cardiac pacemaker   Tobacco use   COPD (chronic obstructive pulmonary disease) (HCC)   Obesity hypoventilation syndrome (HCC)   Flash pulmonary edema (HCC)   Discharge Instructions  Discharge Instructions    Diet - low sodium heart healthy   Complete by: As directed    Discharge instructions   Complete by: As directed    It was pleasure taking care of you. We increased the dose of torsemide to twice daily dose. Continue using your home oxygen as directed. We also ordered Trelegy to help you with your breathing. Follow-up in heart failure clinic and with your pulmonologist.   Increase activity slowly   Complete by: As directed      Allergies as of 11/27/2020      Reactions   Bidil [isosorb Dinitrate-hydralazine] Other (See Comments)   Migraine Headache Migraine Headache   Ciprofloxacin Other (See Comments)   Migraine Headache      Medication List    STOP taking these medications   clopidogrel 75 MG tablet Commonly known as: Plavix     TAKE these medications  amiodarone 200 MG tablet Commonly known as: PACERONE Take 200 mg by mouth 2 (two) times daily.   Anoro Ellipta 62.5-25 MCG/INH Aepb Generic drug: umeclidinium-vilanterol Inhale 1 puff into the lungs daily in  the afternoon.   atorvastatin 40 MG tablet Commonly known as: LIPITOR Take 1 tablet (40 mg total) by mouth daily at 6 PM. What changed: when to take this   carvedilol 3.125 MG tablet Commonly known as: COREG Take 1 tablet (3.125 mg total) by mouth 2 (two) times daily with a meal.   empagliflozin 10 MG Tabs tablet Commonly known as: JARDIANCE Take 10 mg by mouth daily.   pantoprazole 40 MG tablet Commonly known as: Protonix Take 1 tablet (40 mg total) by mouth daily.   rivaroxaban 20 MG Tabs tablet Commonly known as: XARELTO Take 20 mg by mouth daily with supper.   sacubitril-valsartan 97-103 MG Commonly known as: ENTRESTO Take 1 tablet by mouth 2 (two) times daily.   tiotropium 18 MCG inhalation capsule Commonly known as: SPIRIVA Place 18 mcg into inhaler and inhale daily.   Torsemide 40 MG Tabs Take 40 mg by mouth 2 (two) times daily. What changed:   medication strength  when to take this       [place], [facility]. Schedule an appointment as soon as possible for a visit.   Specialty: General Practice Contact information: [place] [address] [phone]        Yolonda Kida, MD. Schedule an appointment as soon as possible for a visit.   Specialties: Cardiology, Internal Medicine Contact information: [address] [place] [address] [phone]              Allergies  Allergen Reactions  . Bidil [Isosorb Dinitrate-Hydralazine] Other (See Comments)    Migraine Headache Migraine Headache  . Ciprofloxacin Other (See Comments)    Migraine Headache    Consultations:  Cardiology  Procedures/Studies: DG Chest 2 View  Result Date: 11/23/2020 CLINICAL DATA:  Shortness of breath EXAM: CHEST - 2 VIEW COMPARISON:  03/28/2020 FINDINGS: Cardiomegaly. New interstitial prominence diffusely. Dual-chamber ICD/pacer leads in unchanged position. No effusion or  pneumothorax seen. IMPRESSION: CHF pattern. Electronically Signed   By: Monte Fantasia M.D.   On: 11/23/2020 08:45   DG Chest Portable 1 View  Result Date: 11/25/2020 CLINICAL DATA:  Shortness of breath EXAM: PORTABLE CHEST 1 VIEW COMPARISON:  11/23/2020 FINDINGS: Cardiac shadow remains enlarged. Defibrillator is again seen. Increasing vascular congestion and parenchymal edema is noted. No bony abnormality is noted. IMPRESSION: Increased vascular congestion and parenchymal edema particularly in the right lung. Electronically Signed   By: Inez Catalina M.D.   On: 11/25/2020 20:19   ECHOCARDIOGRAM COMPLETE  Result Date: 11/23/2020    ECHOCARDIOGRAM REPORT   Patient Name:   NAIF ALABI Date of Exam: 11/23/2020 Medical Rec #:  [id number]        Height:       66.0 in Accession #:    [id number]       Weight:       308.6 lb Date of Birth:  1976-08-10       BSA:          2.405 m Patient Age:    45 years         BP:           113/77 mmHg Patient Gender: M  HR:           112 bpm. Exam Location:  [facility] Procedure: 2D Echo, Color Doppler, Cardiac Doppler and Intracardiac            Opacification Agent Indications:     Dyspnea R06.00  History:         Patient has prior history of Echocardiogram examinations. CHF,                  Defibrillator; Risk Factors:Sleep Apnea, Hypertension, Diabetes                  and Dyslipidemia.  Sonographer:     Charmayne Sheer RDCS (AE) Referring Phys:  [id number] AMY N COX Diagnosing Phys: Yolonda Kida MD  Sonographer Comments: Technically difficult study due to poor echo windows. Image acquisition challenging due to patient body habitus. IMPRESSIONS  1. Severely depressed LVF.  2. Left ventricular ejection fraction, by estimation, is <20%. The left ventricle has severely decreased function. The left ventricle demonstrates global hypokinesis. The left ventricular internal cavity size was severely dilated. There is moderate left  ventricular hypertrophy. Left ventricular  diastolic parameters are consistent with Grade I diastolic dysfunction (impaired relaxation).  3. Right ventricular systolic function is moderately reduced. The right ventricular size is moderately enlarged. Mildly increased right ventricular wall thickness. There is moderately elevated pulmonary artery systolic pressure.  4. Left atrial size was mild to moderately dilated.  5. Right atrial size was mild to moderately dilated.  6. The mitral valve is grossly normal. Mild mitral valve regurgitation.  7. Tricuspid valve regurgitation is moderate to severe.  8. The aortic valve is grossly normal. Aortic valve regurgitation is trivial. Mild aortic valve sclerosis is present, with no evidence of aortic valve stenosis. FINDINGS  Left Ventricle: Left ventricular ejection fraction, by estimation, is <20%. The left ventricle has severely decreased function. The left ventricle demonstrates global hypokinesis. Definity contrast agent was given IV to delineate the left ventricular endocardial borders. The left ventricular internal cavity size was severely dilated. There is moderate left ventricular hypertrophy. Left ventricular diastolic parameters are consistent with Grade I diastolic dysfunction (impaired relaxation). Right Ventricle: The right ventricular size is moderately enlarged. Mildly increased right ventricular wall thickness. Right ventricular systolic function is moderately reduced. There is moderately elevated pulmonary artery systolic pressure. Left Atrium: Left atrial size was mild to moderately dilated. Right Atrium: Right atrial size was mild to moderately dilated. Pericardium: There is no evidence of pericardial effusion. Mitral Valve: The mitral valve is grossly normal. Mild mitral valve regurgitation. MV peak gradient, 8.1 mmHg. The mean mitral valve gradient is 4.0 mmHg. Tricuspid Valve: The tricuspid valve is grossly normal. Tricuspid valve regurgitation is moderate to severe. Aortic Valve: The aortic  valve is grossly normal. Aortic valve regurgitation is trivial. Mild aortic valve sclerosis is present, with no evidence of aortic valve stenosis. Aortic valve mean gradient measures 4.0 mmHg. Aortic valve peak gradient measures 8.0 mmHg. Aortic valve area, by VTI measures 3.12 cm. Pulmonic Valve: The pulmonic valve was normal in structure. Pulmonic valve regurgitation is not visualized. Aorta: The ascending aorta was not well visualized. IAS/Shunts: The interatrial septum was not assessed. Additional Comments: Severely depressed LVF. A device lead is visualized.  LEFT VENTRICLE PLAX 2D LVIDd:         7.90 cm      Diastology LVIDs:         5.90 cm      LV e' lateral:  5.22 cm/s LV PW:         1.60 cm      LV E/e' lateral: 21.6 LV IVS:        1.40 cm LVOT diam:     3.10 cm LV SV:         75 LV SV Index:   31 LVOT Area:     7.55 cm  LV Volumes (MOD) LV vol d, MOD A2C: 452.0 ml LV vol d, MOD A4C: 347.0 ml LV vol s, MOD A2C: 366.0 ml LV vol s, MOD A4C: 338.0 ml LV SV MOD A2C:     86.0 ml LV SV MOD A4C:     347.0 ml LV SV MOD BP:      53.5 ml RIGHT VENTRICLE RV Basal diam:  3.50 cm LEFT ATRIUM              Index       RIGHT ATRIUM           Index LA diam:        6.30 cm  2.62 cm/m  RA Area:     17.00 cm LA Vol (A2C):   91.5 ml  38.05 ml/m RA Volume:   43.20 ml  17.96 ml/m LA Vol (A4C):   146.0 ml 60.71 ml/m LA Biplane Vol: 117.0 ml 48.65 ml/m  AORTIC VALVE                   PULMONIC VALVE AV Area (Vmax):    3.46 cm    PV Vmax:       0.91 m/s AV Area (Vmean):   3.69 cm    PV Vmean:      62.100 cm/s AV Area (VTI):     3.12 cm    PV VTI:        0.172 m AV Vmax:           141.00 cm/s PV Peak grad:  3.3 mmHg AV Vmean:          94.000 cm/s PV Mean grad:  2.0 mmHg AV VTI:            0.242 m AV Peak Grad:      8.0 mmHg AV Mean Grad:      4.0 mmHg LVOT Vmax:         64.60 cm/s LVOT Vmean:        45.900 cm/s LVOT VTI:          0.100 m LVOT/AV VTI ratio: 0.41  AORTA Ao Root diam: 4.00 cm MITRAL VALVE MV Area (PHT): 7.47  cm     SHUNTS MV Area VTI:   4.49 cm     Systemic VTI:  0.10 m MV Peak grad:  8.1 mmHg     Systemic Diam: 3.10 cm MV Mean grad:  4.0 mmHg MV Vmax:       1.42 m/s MV Vmean:      86.9 cm/s MV Decel Time: 102 msec MV E velocity: 113.00 cm/s Yolonda Kida MD Electronically signed by Yolonda Kida MD Signature Date/Time: 11/23/2020/4:13:15 PM    Final    CT Angio Chest/Abd/Pel for Dissection W and/or Wo Contrast  Addendum Date: 11/23/2020   ADDENDUM REPORT: 11/23/2020 09:54 ADDENDUM: Add to addendum of chest CT angiogram: Enlargement of the main pulmonary outflow tract, a finding indicative of pulmonary arterial hypertension. Electronically Signed   By: Lowella Grip III M.D.   On: 11/23/2020 09:54   Result Date: 11/23/2020 CLINICAL  DATA:  Chest and abdominal pain radiating toward back. Shortness of breath EXAM: CT ANGIOGRAPHY CHEST, ABDOMEN AND PELVIS TECHNIQUE: Non-contrast CT of the chest was initially obtained. Multidetector CT imaging through the chest, abdomen and pelvis was performed using the standard protocol during bolus administration of intravenous contrast. Multiplanar reconstructed images and MIPs were obtained and reviewed to evaluate the vascular anatomy. CONTRAST:  119mL OMNIPAQUE IOHEXOL 350 MG/ML SOLN COMPARISON:  Chest radiograph November 23, 2020; CT abdomen and pelvis March 28, 2020. Chest radiograph May 28, 2017 FINDINGS: CTA CHEST FINDINGS Cardiovascular: No evident intramural hematoma in the thoracic aorta on noncontrast enhanced study. There is no evident mediastinal hematoma. There is no appreciable thoracic aortic aneurysm or dissection. Visualized great vessels appear unremarkable. Note that the right innominate and left common carotid arteries arise as a common trunk, an anatomic variant. There is no appreciable aneurysm or dissection involving the visualized great vessels. No appreciable atherosclerotic plaque noted in the visualized great vessels. Rather minimal  atherosclerotic calcification noted in the aorta. No evidence of aortic ulceration in the thoracic region. No appreciable pulmonary embolus. Main pulmonary outflow tract measures 4.0 cm, enlarged. Heart is enlarged with pacemaker leads attached to the right atrium and right ventricle. No pericardial effusion or pericardial thickening. Mediastinum/Nodes: Visualized thyroid appears unremarkable. There is lymph node enlargement slightly to the right of the lower trachea measuring 1.5 x 1.5 cm. A lymph node in the aortopulmonary window region measures 1.2 x 1.1 cm. There are scattered subcentimeter mediastinal lymph nodes as well. No esophageal lesions are evident. Lungs/Pleura: Scattered bullae are noted in the apices as well as throughout portions of the right lower lobe. There is no edema or consolidation. No pleural effusions. There are areas of mosaic attenuation in the lower lobe regions bilaterally. No pneumothorax. Trachea and major bronchial structures appear normal. Musculoskeletal: No acute appearing fracture or dislocation. There is mild anterior wedging of the T8, T9, and T10 vertebral bodies, stable. No blastic or lytic bone lesions. No evident chest wall lesions. Review of the MIP images confirms the above findings. CTA ABDOMEN AND PELVIS FINDINGS VASCULAR Aorta: There is no abdominal aortic aneurysm or dissection. There is modest plaque in the distal aorta, not causing hemodynamically significant obstruction. Celiac: Celiac artery and its branches are widely patent. No aneurysm or dissection involving these vessels. SMA: Superior mesenteric artery and its branches are widely patent. No aneurysm or dissection involving these vessels. Renals: There are 2 renal arteries on each side. There is no appreciable aneurysm or dissection involving renal arteries or respective branches. No fibromuscular dysplasia. No appreciable atherosclerotic irregularity in these vessels. IMA: Inferior mesenteric artery and its  branches are widely patent. No aneurysm or dissection involving these vessels. Inflow: Minimal atherosclerotic calcification is noted at the origins of each common iliac artery. There is also calcification in the proximal left internal iliac artery. Other major arterial pelvic vessels are widely patent. No aneurysm or dissection involving pelvic arterial vessels noted. Visualized profunda femoral and superficial femoral arteries are patent without aneurysm or dissection involving these vessels. Veins: No obvious venous abnormality within the limitations of this arterial phase study. Review of the MIP images confirms the above findings. NON-VASCULAR Hepatobiliary: Liver measures 20.9 cm in length. Subtle irregularity along the liver capsule again noted. No focal liver lesions are appreciable. There is questionable thickening of the gallbladder wall. No evident biliary duct dilatation. Pancreas: No evident pancreatic mass or inflammatory focus. Spleen: No splenic lesions are appreciable. Adrenals/Urinary Tract: Adrenals bilaterally  appear normal. No evident renal mass or hydronephrosis on either side. There is no appreciable renal or ureteral calculus on either side. Urinary bladder is midline with wall thickness within normal limits. Stomach/Bowel: There is no appreciable bowel wall or mesenteric thickening. The terminal ileum appears normal. There is no evident bowel obstruction. Appendix appears normal. No evident free air or portal venous air. Lymphatic: There are subcentimeter inguinal lymph nodes bilaterally, considered nonspecific and stable. There is a lymph node anterior to the inferior vena cava in the periportal region with a short axis diameter of 1.4 cm, stable. Subcentimeter retroperitoneal lymph nodes are stable. No new lymph node enlargement evident. Reproductive: Prostate prominent but stable. Seminal vesicles appear normal. Prostate abuts the inferior aspect of the bladder, a stable finding. Other:  No evident abscess or ascites in the abdomen or pelvis. There is a focal umbilical hernia containing fat but no bowel, unchanged from prior study. Musculoskeletal: Degenerative changes noted in the lumbar spine. There are no blastic or lytic bone lesions. No intramuscular lesions evident. Review of the MIP images confirms the above findings. IMPRESSION: CT angiogram chest: 1. No thoracic aortic aneurysm or dissection. Minimal aortic atherosclerosis. Visualized great vessels appear unremarkable. 2. Scattered areas of bullous disease, more severe on the right than on the left. Scattered areas of mosaic attenuation in the lower lobes likely represents underlying small airways obstructive disease. No edema or consolidation. No pleural effusions. 3. Prominent inferior pretracheal and aortopulmonary window lymph nodes of uncertain etiology. 4. Pacemaker leads attached to right heart. There is cardiomegaly. No pericardial effusion. 5.  No evident pulmonary embolus. CT angiogram abdomen; CT angiogram pelvis: 1. Mild atherosclerotic plaque in the aorta and proximal common iliac arteries. There is also atherosclerotic plaque in the internal iliac artery. No hemodynamically significant obstruction evident in the aorta, major mesenteric, and major pelvic arterial vessels. 2. No aneurysm or dissection involving the aorta, major mesenteric, and major pelvic arterial vessels. No fibromuscular dysplasia. 3. The appearance of the liver suggests a degree of cirrhosis. A prominent pericaval lymph node is stable and may be secondary to underlying hepatic cirrhosis. 4. Question a degree of gallbladder wall thickening. This finding may warrant ultrasound of the gallbladder to further assess. 5. No evident bowel wall thickening or bowel obstruction. No abscess in the abdomen or pelvis. Appendix unremarkable. 6. No renal or ureteral calculus. No hydronephrosis. Urinary bladder wall thickness normal. 7. Prominent prostate, stable. This  finding may warrant correlation with PSA. 8.  Stable umbilical hernia containing fat but no bowel. Aortic Atherosclerosis (ICD10-I70.0). Electronically Signed: By: Lowella Grip III M.D. On: 11/23/2020 09:47     Subjective: Patient was feeling better when seen today.  We discussed about increasing the dose of torsemide and a close follow-up with cardiology and pulmonology for his heart failure and severe COPD.  Patient had oxygen at home.  Discharge Exam: Vitals:   11/27/20 0600 11/27/20 0800  BP: (!) 115/99 109/90  Pulse: 83 80  Resp: (!) 22 16  Temp:  98.3 F (36.8 C)  SpO2: 95% 97%   Vitals:   11/27/20 0400 11/27/20 0500 11/27/20 0600 11/27/20 0800  BP: (!) 123/93 104/77 (!) 115/99 109/90  Pulse: 78 68 83 80  Resp: 13 19 (!) 22 16  Temp:    98.3 F (36.8 C)  TempSrc:    Oral  SpO2: 98% 100% 95% 97%  Weight:      Height:        General: Pt is alert,  awake, not in acute distress Cardiovascular: RRR, S1/S2 +, no rubs, no gallops Respiratory: CTA bilaterally, no wheezing, no rhonchi Abdominal: Soft, NT, ND, bowel sounds + Extremities: no edema, no cyanosis   The results of significant diagnostics from this hospitalization (including imaging, microbiology, ancillary and laboratory) are listed below for reference.    Microbiology: Recent Results (from the past 240 hour(s))  Resp Panel by RT-PCR (Flu A&B, Covid) Nasopharyngeal Swab     Status: None   Collection Time: 11/23/20  9:30 AM   Specimen: Nasopharyngeal Swab; Nasopharyngeal(NP) swabs in vial transport medium  Result Value Ref Range Status   SARS Coronavirus 2 by RT PCR NEGATIVE NEGATIVE Final    Comment: (NOTE) SARS-CoV-2 target nucleic acids are NOT DETECTED.  The SARS-CoV-2 RNA is generally detectable in upper respiratory specimens during the acute phase of infection. The lowest concentration of SARS-CoV-2 viral copies this assay can detect is 138 copies/mL. A negative result does not preclude  SARS-Cov-2 infection and should not be used as the sole basis for treatment or other patient management decisions. A negative result may occur with  improper specimen collection/handling, submission of specimen other than nasopharyngeal swab, presence of viral mutation(s) within the areas targeted by this assay, and inadequate number of viral copies(<138 copies/mL). A negative result must be combined with clinical observations, patient history, and epidemiological information. The expected result is Negative.  Fact Sheet for Patients:  EntrepreneurPulse.com.au  Fact Sheet for Healthcare Providers:  IncredibleEmployment.be  This test is no t yet approved or cleared by the [place] FDA and  has been authorized for detection and/or diagnosis of SARS-CoV-2 by FDA under an Emergency Use Authorization (EUA). This EUA will remain  in effect (meaning this test can be used) for the duration of the COVID-19 declaration under Section 564(b)(1) of the Act, 21 U.S.C.section 360bbb-3(b)(1), unless the authorization is terminated  or revoked sooner.       Influenza A by PCR NEGATIVE NEGATIVE Final   Influenza B by PCR NEGATIVE NEGATIVE Final    Comment: (NOTE) The Xpert Xpress SARS-CoV-2/FLU/RSV plus assay is intended as an aid in the diagnosis of influenza from Nasopharyngeal swab specimens and should not be used as a sole basis for treatment. Nasal washings and aspirates are unacceptable for Xpert Xpress SARS-CoV-2/FLU/RSV testing.  Fact Sheet for Patients: EntrepreneurPulse.com.au  Fact Sheet for Healthcare Providers: IncredibleEmployment.be  This test is not yet approved or cleared by the [place] FDA and has been authorized for detection and/or diagnosis of SARS-CoV-2 by FDA under an Emergency Use Authorization (EUA). This EUA will remain in effect (meaning this test can be used) for the duration of  the COVID-19 declaration under Section 564(b)(1) of the Act, 21 U.S.C. section 360bbb-3(b)(1), unless the authorization is terminated or revoked.  Performed at [facility], [address] [place], [place] [address]   Resp Panel by RT-PCR (Flu A&B, Covid) Nasopharyngeal Swab     Status: None   Collection Time: 11/25/20  9:27 PM   Specimen: Nasopharyngeal Swab; Nasopharyngeal(NP) swabs in vial transport medium  Result Value Ref Range Status   SARS Coronavirus 2 by RT PCR NEGATIVE NEGATIVE Final    Comment: (NOTE) SARS-CoV-2 target nucleic acids are NOT DETECTED.  The SARS-CoV-2 RNA is generally detectable in upper respiratory specimens during the acute phase of infection. The lowest concentration of SARS-CoV-2 viral copies this assay can detect is 138 copies/mL. A negative result does not preclude SARS-Cov-2 infection and should not be used as the  sole basis for treatment or other patient management decisions. A negative result may occur with  improper specimen collection/handling, submission of specimen other than nasopharyngeal swab, presence of viral mutation(s) within the areas targeted by this assay, and inadequate number of viral copies(<138 copies/mL). A negative result must be combined with clinical observations, patient history, and epidemiological information. The expected result is Negative.  Fact Sheet for Patients:  EntrepreneurPulse.com.au  Fact Sheet for Healthcare Providers:  IncredibleEmployment.be  This test is no t yet approved or cleared by the [place] FDA and  has been authorized for detection and/or diagnosis of SARS-CoV-2 by FDA under an Emergency Use Authorization (EUA). This EUA will remain  in effect (meaning this test can be used) for the duration of the COVID-19 declaration under Section 564(b)(1) of the Act, 21 U.S.C.section 360bbb-3(b)(1), unless the authorization is terminated  or revoked  sooner.       Influenza A by PCR NEGATIVE NEGATIVE Final   Influenza B by PCR NEGATIVE NEGATIVE Final    Comment: (NOTE) The Xpert Xpress SARS-CoV-2/FLU/RSV plus assay is intended as an aid in the diagnosis of influenza from Nasopharyngeal swab specimens and should not be used as a sole basis for treatment. Nasal washings and aspirates are unacceptable for Xpert Xpress SARS-CoV-2/FLU/RSV testing.  Fact Sheet for Patients: EntrepreneurPulse.com.au  Fact Sheet for Healthcare Providers: IncredibleEmployment.be  This test is not yet approved or cleared by the [place] FDA and has been authorized for detection and/or diagnosis of SARS-CoV-2 by FDA under an Emergency Use Authorization (EUA). This EUA will remain in effect (meaning this test can be used) for the duration of the COVID-19 declaration under Section 564(b)(1) of the Act, 21 U.S.C. section 360bbb-3(b)(1), unless the authorization is terminated or revoked.  Performed at [facility], [address] [place], [place] [address]   MRSA PCR Screening     Status: None   Collection Time: 11/25/20 11:21 PM   Specimen: Nasopharyngeal  Result Value Ref Range Status   MRSA by PCR NEGATIVE NEGATIVE Final    Comment:        The GeneXpert MRSA Assay (FDA approved for NASAL specimens only), is one component of a comprehensive MRSA colonization surveillance program. It is not intended to diagnose MRSA infection nor to guide or monitor treatment for MRSA infections. Performed at [facility], [address] [place], [place] [address]      Labs: BNP (last 3 results) Recent Labs    11/15/20 1039 11/23/20 0806 11/25/20 1958  BNP 805.7* 1,173.5* 979.4*   Basic Metabolic Panel: Recent Labs  Lab 11/23/20 0806 11/24/20 8016 11/25/20 0405 11/25/20 1958 11/26/20 0219 11/27/20 0518  NA 134* 136 135 138 140 139  K 3.8 3.6 3.6 3.7 4.0 3.6  CL 96* 97* 97* 96* 98  98  CO2 29 30 28 31  33* 33*  GLUCOSE 218* 173* 170* 213* 140* 212*  BUN 41* 36* 34* 36* 36* 35*  CREATININE 1.97* 1.84* 1.73* 1.84* 1.74* 1.67*  CALCIUM 8.7* 8.7* 8.8* 8.7* 8.5* 8.5*  MG 1.4* 1.8 1.6* 2.3  --   --    Liver Function Tests: Recent Labs  Lab 11/23/20 0806 11/25/20 1958  AST 37 40  ALT 45* 43  ALKPHOS 253* 275*  BILITOT 1.0 1.3*  PROT 7.7 8.7*  ALBUMIN 3.5 3.8   Recent Labs  Lab 11/23/20 0806  LIPASE 47   No results for input(s): AMMONIA in the last 168 hours. CBC: Recent Labs  Lab 11/23/20  9811 11/24/20 9147 11/25/20 1958 11/26/20 0219 11/27/20 0518  WBC 5.7 5.6 6.8 6.3 5.3  NEUTROABS  --  3.0 3.9  --   --   HGB 14.2 13.6 14.6 13.8 13.6  HCT 41.6 40.3 43.4 39.7 39.4  MCV 96.7 98.3 97.7 97.3 97.3  PLT 141* 138* 155 140* 126*   Cardiac Enzymes: No results for input(s): CKTOTAL, CKMB, CKMBINDEX, TROPONINI in the last 168 hours. BNP: Invalid input(s): POCBNP CBG: Recent Labs  Lab 11/24/20 1626 11/24/20 2105 11/25/20 0720 11/25/20 1119 11/25/20 2320  GLUCAP 197* 225* 173* 196* 215*   D-Dimer No results for input(s): DDIMER in the last 72 hours. Hgb A1c No results for input(s): HGBA1C in the last 72 hours. Lipid Profile No results for input(s): CHOL, HDL, LDLCALC, TRIG, CHOLHDL, LDLDIRECT in the last 72 hours. Thyroid function studies No results for input(s): TSH, T4TOTAL, T3FREE, THYROIDAB in the last 72 hours.  Invalid input(s): FREET3 Anemia work up No results for input(s): VITAMINB12, FOLATE, FERRITIN, TIBC, IRON, RETICCTPCT in the last 72 hours. Urinalysis    Component Value Date/Time   COLORURINE YELLOW (A) 04/12/2020 0804   APPEARANCEUR HAZY (A) 04/12/2020 0804   APPEARANCEUR Hazy 08/21/2014 0800   LABSPEC 1.006 04/12/2020 0804   LABSPEC 1.015 08/21/2014 0800   PHURINE 5.0 04/12/2020 0804   GLUCOSEU NEGATIVE 04/12/2020 0804   GLUCOSEU Negative 08/21/2014 0800   HGBUR NEGATIVE 04/12/2020 0804   BILIRUBINUR NEGATIVE  04/12/2020 0804   BILIRUBINUR Negative 08/21/2014 0800   KETONESUR 5 (A) 04/12/2020 0804   PROTEINUR 30 (A) 04/12/2020 0804   NITRITE NEGATIVE 04/12/2020 0804   LEUKOCYTESUR NEGATIVE 04/12/2020 0804   LEUKOCYTESUR 1+ 08/21/2014 0800   Sepsis Labs Invalid input(s): PROCALCITONIN,  WBC,  LACTICIDVEN Microbiology Recent Results (from the past 240 hour(s))  Resp Panel by RT-PCR (Flu A&B, Covid) Nasopharyngeal Swab     Status: None   Collection Time: 11/23/20  9:30 AM   Specimen: Nasopharyngeal Swab; Nasopharyngeal(NP) swabs in vial transport medium  Result Value Ref Range Status   SARS Coronavirus 2 by RT PCR NEGATIVE NEGATIVE Final    Comment: (NOTE) SARS-CoV-2 target nucleic acids are NOT DETECTED.  The SARS-CoV-2 RNA is generally detectable in upper respiratory specimens during the acute phase of infection. The lowest concentration of SARS-CoV-2 viral copies this assay can detect is 138 copies/mL. A negative result does not preclude SARS-Cov-2 infection and should not be used as the sole basis for treatment or other patient management decisions. A negative result may occur with  improper specimen collection/handling, submission of specimen other than nasopharyngeal swab, presence of viral mutation(s) within the areas targeted by this assay, and inadequate number of viral copies(<138 copies/mL). A negative result must be combined with clinical observations, patient history, and epidemiological information. The expected result is Negative.  Fact Sheet for Patients:  EntrepreneurPulse.com.au  Fact Sheet for Healthcare Providers:  IncredibleEmployment.be  This test is no t yet approved or cleared by the [place] FDA and  has been authorized for detection and/or diagnosis of SARS-CoV-2 by FDA under an Emergency Use Authorization (EUA). This EUA will remain  in effect (meaning this test can be used) for the duration of the COVID-19  declaration under Section 564(b)(1) of the Act, 21 U.S.C.section 360bbb-3(b)(1), unless the authorization is terminated  or revoked sooner.       Influenza A by PCR NEGATIVE NEGATIVE Final   Influenza B by PCR NEGATIVE NEGATIVE Final    Comment: (NOTE) The Xpert Xpress SARS-CoV-2/FLU/RSV plus  assay is intended as an aid in the diagnosis of influenza from Nasopharyngeal swab specimens and should not be used as a sole basis for treatment. Nasal washings and aspirates are unacceptable for Xpert Xpress SARS-CoV-2/FLU/RSV testing.  Fact Sheet for Patients: EntrepreneurPulse.com.au  Fact Sheet for Healthcare Providers: IncredibleEmployment.be  This test is not yet approved or cleared by the [place] FDA and has been authorized for detection and/or diagnosis of SARS-CoV-2 by FDA under an Emergency Use Authorization (EUA). This EUA will remain in effect (meaning this test can be used) for the duration of the COVID-19 declaration under Section 564(b)(1) of the Act, 21 U.S.C. section 360bbb-3(b)(1), unless the authorization is terminated or revoked.  Performed at [facility], [address] [place], [place] [address]   Resp Panel by RT-PCR (Flu A&B, Covid) Nasopharyngeal Swab     Status: None   Collection Time: 11/25/20  9:27 PM   Specimen: Nasopharyngeal Swab; Nasopharyngeal(NP) swabs in vial transport medium  Result Value Ref Range Status   SARS Coronavirus 2 by RT PCR NEGATIVE NEGATIVE Final    Comment: (NOTE) SARS-CoV-2 target nucleic acids are NOT DETECTED.  The SARS-CoV-2 RNA is generally detectable in upper respiratory specimens during the acute phase of infection. The lowest concentration of SARS-CoV-2 viral copies this assay can detect is 138 copies/mL. A negative result does not preclude SARS-Cov-2 infection and should not be used as the sole basis for treatment or other patient management decisions. A negative result  may occur with  improper specimen collection/handling, submission of specimen other than nasopharyngeal swab, presence of viral mutation(s) within the areas targeted by this assay, and inadequate number of viral copies(<138 copies/mL). A negative result must be combined with clinical observations, patient history, and epidemiological information. The expected result is Negative.  Fact Sheet for Patients:  EntrepreneurPulse.com.au  Fact Sheet for Healthcare Providers:  IncredibleEmployment.be  This test is no t yet approved or cleared by the [place] FDA and  has been authorized for detection and/or diagnosis of SARS-CoV-2 by FDA under an Emergency Use Authorization (EUA). This EUA will remain  in effect (meaning this test can be used) for the duration of the COVID-19 declaration under Section 564(b)(1) of the Act, 21 U.S.C.section 360bbb-3(b)(1), unless the authorization is terminated  or revoked sooner.       Influenza A by PCR NEGATIVE NEGATIVE Final   Influenza B by PCR NEGATIVE NEGATIVE Final    Comment: (NOTE) The Xpert Xpress SARS-CoV-2/FLU/RSV plus assay is intended as an aid in the diagnosis of influenza from Nasopharyngeal swab specimens and should not be used as a sole basis for treatment. Nasal washings and aspirates are unacceptable for Xpert Xpress SARS-CoV-2/FLU/RSV testing.  Fact Sheet for Patients: EntrepreneurPulse.com.au  Fact Sheet for Healthcare Providers: IncredibleEmployment.be  This test is not yet approved or cleared by the [place] FDA and has been authorized for detection and/or diagnosis of SARS-CoV-2 by FDA under an Emergency Use Authorization (EUA). This EUA will remain in effect (meaning this test can be used) for the duration of the COVID-19 declaration under Section 564(b)(1) of the Act, 21 U.S.C. section 360bbb-3(b)(1), unless the authorization is terminated  or revoked.  Performed at [facility], [address] [place], [place] [address]   MRSA PCR Screening     Status: None   Collection Time: 11/25/20 11:21 PM   Specimen: Nasopharyngeal  Result Value Ref Range Status   MRSA by PCR NEGATIVE NEGATIVE Final    Comment:  The GeneXpert MRSA Assay (FDA approved for NASAL specimens only), is one component of a comprehensive MRSA colonization surveillance program. It is not intended to diagnose MRSA infection nor to guide or monitor treatment for MRSA infections. Performed at [facility], [address] [place], [place] [address]     Time coordinating discharge: Over 30 minutes  SIGNED:  Lorella Nimrod, MD  [facility] 11/27/2020, 12:52 PM  If 7PM-7AM, please contact night-coverage [email or web address]  This record has been created using Systems analyst. Errors have been sought and corrected,but may not always be located. Such creation errors do not reflect on the standard of care.

## 2020-11-29 LAB — H PYLORI, IGM, IGG, IGA AB
H Pylori IgG: 0.85 Index Value — ABNORMAL HIGH (ref 0.00–0.79)
H. Pylogi, Iga Abs: <9 units (ref 0.0–8.9)
H. Pylogi, Igm Abs: <9 units (ref 0.0–8.9)

## 2020-12-03 ENCOUNTER — Telehealth: Payer: Self-pay | Admitting: *Deleted

## 2020-12-03 ENCOUNTER — Telehealth: Payer: Self-pay | Admitting: Podiatry

## 2020-12-03 NOTE — Telephone Encounter (Signed)
Called patient and informed per Dr Prudence Davidson that he could not prescribe any anti inflammatory medicine because patient is currently taking Xarelto. Patient said that he is no longer on that medicine and said that he was given antiinflammatory in past. I asked him to call back with the name of medicine and he said that he would.

## 2020-12-03 NOTE — Telephone Encounter (Signed)
patient requesting a refill on antinflammatory medication to help with gout

## 2020-12-04 ENCOUNTER — Other Ambulatory Visit: Payer: Self-pay

## 2020-12-04 ENCOUNTER — Encounter: Payer: Self-pay | Admitting: Podiatry

## 2020-12-04 ENCOUNTER — Ambulatory Visit (INDEPENDENT_AMBULATORY_CARE_PROVIDER_SITE_OTHER): Payer: Medicare Other | Admitting: Podiatry

## 2020-12-04 ENCOUNTER — Other Ambulatory Visit: Payer: Self-pay | Admitting: Podiatry

## 2020-12-04 ENCOUNTER — Ambulatory Visit (INDEPENDENT_AMBULATORY_CARE_PROVIDER_SITE_OTHER): Payer: Medicare Other

## 2020-12-04 DIAGNOSIS — M7751 Other enthesopathy of right foot: Secondary | ICD-10-CM

## 2020-12-04 DIAGNOSIS — S9031XA Contusion of right foot, initial encounter: Secondary | ICD-10-CM

## 2020-12-04 DIAGNOSIS — T148XXA Other injury of unspecified body region, initial encounter: Secondary | ICD-10-CM

## 2020-12-05 ENCOUNTER — Encounter: Payer: Self-pay | Admitting: Podiatry

## 2020-12-05 NOTE — Progress Notes (Signed)
Subjective:  Patient ID: Oscar [name], male    DOB: 1975/09/19,  MRN: [id number]  Chief Complaint  Patient presents with  . Foot Pain    Patient presents today for right midfoot pain x 1 week.  He says the paramedics hit his foot with something when transporting him a week ago.  Now his foot is red, swollen and very painful    45 y.o. male presents with the above complaint.  Patient presents with complaint of right midfoot pain that has been going on for a week.  Patient states his foot got hit while he was being taken out on a stretcher for through paramedics.  He does have some pain on bruising associated with it.  He has not tried anything for it.  He just wanted to make sure that this is not gout attack as well.  He does have some history of gout.  He denies any other acute complaints he has not seen anyone else prior to seeing me.   Review of Systems: Negative except as noted in the HPI. Denies N/V/F/Ch.  Past Medical History:  Diagnosis Date  . AICD (automatic cardioverter/defibrillator) present   . Asthma   . Cardiomyopathy ([place])   . CHF (congestive heart failure) ([place])   . Coronary artery disease   . Deafness in right ear   . Diabetes mellitus without complication ([place])   . Dilated cardiomyopathy ([place])   . Dysrhythmia    svt  . 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 GERD (gastroesophageal reflux disease)   . Hyperlipidemia   . Hypertension   . Hypoxemia   . Hypoxemia   . Mild obesity   . Myocardial infarction ([place])    [id number]/93  . Pancreatitis   . Sleep apnea    osa    Current Outpatient Medications:  .  amiodarone (PACERONE) 200 MG tablet, Take 200 mg by mouth 2 (two) times daily., Disp: , Rfl:  .  atorvastatin (LIPITOR) 40 MG tablet, Take 1 tablet (40 mg total) by mouth daily at 6 PM. (Patient taking differently: Take 40 mg by mouth at bedtime.), Disp: 90 tablet, Rfl: 3 .  carvedilol (COREG) 3.125 MG tablet, Take 1 tablet (3.125 mg total) by  mouth 2 (two) times daily with a meal., Disp: 60 tablet, Rfl: 0 .  empagliflozin (JARDIANCE) 10 MG TABS tablet, Take 10 mg by mouth daily., Disp: , Rfl:  .  pantoprazole (PROTONIX) 40 MG tablet, Take 1 tablet (40 mg total) by mouth daily., Disp: 30 tablet, Rfl: 0 .  rivaroxaban (XARELTO) 20 MG TABS tablet, Take 20 mg by mouth daily with supper., Disp: , Rfl:  .  sacubitril-valsartan (ENTRESTO) 97-103 MG, Take 1 tablet by mouth 2 (two) times daily., Disp: 60 tablet, Rfl: 5 .  tiotropium (SPIRIVA) 18 MCG inhalation capsule, Place 18 mcg into inhaler and inhale daily., Disp: , Rfl:  .  torsemide 40 MG TABS, Take 40 mg by mouth 2 (two) times daily., Disp: 120 tablet, Rfl: 1 .  umeclidinium-vilanterol (ANORO ELLIPTA) 62.5-25 MCG/INH AEPB, Inhale 1 puff into the lungs daily in the afternoon., Disp: , Rfl:  .  VENTOLIN HFA 108 (90 Base) MCG/ACT inhaler, Inhale into the lungs., Disp: , Rfl:   Social History   Tobacco Use  Smoking Status Current Every Day Smoker  . Packs/day: 0.50  . Years: 24.00  . Pack years: 12.00  . Types: Cigarettes  Smokeless Tobacco Never Used  Tobacco Comment   4/5  Smoking 5 cigs a day    Allergies  Allergen Reactions  . Bidil [Isosorb Dinitrate-Hydralazine] Other (See Comments)    Migraine Headache Migraine Headache  . Ciprofloxacin Other (See Comments)    Migraine Headache   Objective:  There were no vitals filed for this visit. There is no height or weight on file to calculate BMI. Constitutional Well developed. Well nourished.  Vascular Dorsalis pedis pulses palpable bilaterally. Posterior tibial pulses palpable bilaterally. Capillary refill normal to all digits.  No cyanosis or clubbing noted. Pedal hair growth normal.  Neurologic Normal speech. Oriented to person, place, and time. Epicritic sensation to light touch grossly present bilaterally.  Dermatologic Nails well groomed and normal in appearance. No open wounds. No skin lesions.  Orthopedic:   Pain on palpation dorsal lateral midfoot.  No pain with resisted dorsiflexion plantarflexion of the digit ruling out extensor tendinitis/flexor tendinitis.  No pain at the Lisfranc interval.  No pain with range of motion of the tarsometatarsal joints 1 through 5.  No fluctuance noted ruling out hematoma   Radiographs: 3 views of skeletally mature adult right foot: No fractures noted.  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  No other bony abnormalities identified. Assessment:   1. Contusion of soft tissue    Plan:  Patient was evaluated and treated and all questions answered.  Right dorsal lateral foot soft tissue contusion -I explained the patient the etiology of contusion various treatment options were extensively discussed.  Clinically I am not able to palpate any hematoma as well.  At this time patient may have bruised the soft tissue secondary to injury I believe patient will benefit from cam boot immobilization to allow the soft tissue to heal.  There is no osseous involvement at this time.  Patient agrees with the plan. -He will ambulate with a cam boot weightbearing as tolerated  No follow-ups on file.

## 2020-12-06 ENCOUNTER — Ambulatory Visit: Payer: Medicare Other | Admitting: Podiatry

## 2020-12-06 LAB — BLOOD GAS, ARTERIAL
Acid-Base Excess: 9.1 mmol/L — ABNORMAL HIGH (ref 0.0–2.0)
Bicarbonate: 34.2 mmol/L — ABNORMAL HIGH (ref 20.0–28.0)
Delivery systems: POSITIVE
Expiratory PAP: 6
FIO2: 0.5
Inspiratory PAP: 14
Mode: POSITIVE
O2 Saturation: 99.3 %
Patient temperature: 37
pCO2 arterial: 47 mmHg (ref 32.0–48.0)
pH, Arterial: 7.47 — ABNORMAL HIGH (ref 7.350–7.450)
pO2, Arterial: 142 mmHg — ABNORMAL HIGH (ref 83.0–108.0)

## 2020-12-06 LAB — BLOOD GAS, VENOUS
Acid-Base Excess: 6.1 mmol/L — ABNORMAL HIGH (ref 0.0–2.0)
Bicarbonate: 36.3 mmol/L — ABNORMAL HIGH (ref 20.0–28.0)
FIO2: 0.75
Mechanical Rate: 10
O2 Saturation: 45.1 %
Patient temperature: 37
pCO2, Ven: 79 mmHg (ref 44.0–60.0)
pH, Ven: 7.27 (ref 7.250–7.430)

## 2020-12-10 ENCOUNTER — Ambulatory Visit: Payer: Medicare Other | Admitting: Podiatry

## 2020-12-12 ENCOUNTER — Emergency Department: Payer: Medicare Other

## 2020-12-12 ENCOUNTER — Other Ambulatory Visit: Payer: Self-pay

## 2020-12-12 ENCOUNTER — Encounter: Payer: Self-pay | Admitting: Emergency Medicine

## 2020-12-12 ENCOUNTER — Observation Stay: Payer: Medicare Other | Admitting: Anesthesiology

## 2020-12-12 ENCOUNTER — Observation Stay
Admission: EM | Admit: 2020-12-12 | Discharge: 2020-12-13 | Disposition: A | Discharge status: Home / Self Care | Payer: Medicare Other | Attending: Surgery | Admitting: Surgery

## 2020-12-12 ENCOUNTER — Encounter
Admission: EM | Disposition: A | Discharge status: Home / Self Care | Payer: Self-pay | Source: Home / Self Care | Attending: Emergency Medicine

## 2020-12-12 DIAGNOSIS — J449 Chronic obstructive pulmonary disease, unspecified: Secondary | ICD-10-CM | POA: Insufficient documentation

## 2020-12-12 DIAGNOSIS — B954 Other streptococcus as the cause of diseases classified elsewhere: Secondary | ICD-10-CM | POA: Diagnosis not present

## 2020-12-12 DIAGNOSIS — F17210 Nicotine dependence, cigarettes, uncomplicated: Secondary | ICD-10-CM | POA: Insufficient documentation

## 2020-12-12 DIAGNOSIS — B9689 Other specified bacterial agents as the cause of diseases classified elsewhere: Secondary | ICD-10-CM

## 2020-12-12 DIAGNOSIS — K611 Rectal abscess: Principal | ICD-10-CM | POA: Insufficient documentation

## 2020-12-12 DIAGNOSIS — E119 Type 2 diabetes mellitus without complications: Secondary | ICD-10-CM | POA: Insufficient documentation

## 2020-12-12 DIAGNOSIS — Z8679 Personal history of other diseases of the circulatory system: Secondary | ICD-10-CM

## 2020-12-12 DIAGNOSIS — I5022 Chronic systolic (congestive) heart failure: Secondary | ICD-10-CM | POA: Diagnosis not present

## 2020-12-12 DIAGNOSIS — I2510 Atherosclerotic heart disease of native coronary artery without angina pectoris: Secondary | ICD-10-CM | POA: Diagnosis not present

## 2020-12-12 DIAGNOSIS — Z20822 Contact with and (suspected) exposure to covid-19: Secondary | ICD-10-CM | POA: Diagnosis not present

## 2020-12-12 DIAGNOSIS — Z79899 Other long term (current) drug therapy: Secondary | ICD-10-CM | POA: Insufficient documentation

## 2020-12-12 DIAGNOSIS — K612 Anorectal abscess: Secondary | ICD-10-CM

## 2020-12-12 DIAGNOSIS — I110 Hypertensive heart disease with heart failure: Secondary | ICD-10-CM | POA: Insufficient documentation

## 2020-12-12 DIAGNOSIS — E1165 Type 2 diabetes mellitus with hyperglycemia: Secondary | ICD-10-CM

## 2020-12-12 DIAGNOSIS — J45909 Unspecified asthma, uncomplicated: Secondary | ICD-10-CM | POA: Insufficient documentation

## 2020-12-12 DIAGNOSIS — N454 Abscess of epididymis or testis: Secondary | ICD-10-CM

## 2020-12-12 DIAGNOSIS — I5043 Acute on chronic combined systolic (congestive) and diastolic (congestive) heart failure: Secondary | ICD-10-CM | POA: Diagnosis not present

## 2020-12-12 DIAGNOSIS — G4733 Obstructive sleep apnea (adult) (pediatric): Secondary | ICD-10-CM

## 2020-12-12 DIAGNOSIS — I509 Heart failure, unspecified: Secondary | ICD-10-CM | POA: Diagnosis not present

## 2020-12-12 DIAGNOSIS — L02215 Cutaneous abscess of perineum: Secondary | ICD-10-CM | POA: Diagnosis present

## 2020-12-12 DIAGNOSIS — E785 Hyperlipidemia, unspecified: Secondary | ICD-10-CM

## 2020-12-12 DIAGNOSIS — I10 Essential (primary) hypertension: Secondary | ICD-10-CM

## 2020-12-12 DIAGNOSIS — K219 Gastro-esophageal reflux disease without esophagitis: Secondary | ICD-10-CM

## 2020-12-12 DIAGNOSIS — Z6841 Body Mass Index (BMI) 40.0 and over, adult: Secondary | ICD-10-CM

## 2020-12-12 HISTORY — PX: INCISION AND DRAINAGE ABSCESS: SHX5864

## 2020-12-12 LAB — COMPREHENSIVE METABOLIC PANEL
ALT: 44 U/L (ref 0–44)
AST: 46 U/L — ABNORMAL HIGH (ref 15–41)
Albumin: 3.4 g/dL — ABNORMAL LOW (ref 3.5–5.0)
Alkaline Phosphatase: 230 U/L — ABNORMAL HIGH (ref 38–126)
Anion gap: 13 (ref 5–15)
BUN: 54 mg/dL — ABNORMAL HIGH (ref 6–20)
CO2: 27 mmol/L (ref 22–32)
Calcium: 8.8 mg/dL — ABNORMAL LOW (ref 8.9–10.3)
Chloride: 93 mmol/L — ABNORMAL LOW (ref 98–111)
Creatinine, Ser: 1.84 mg/dL — ABNORMAL HIGH (ref 0.61–1.24)
GFR, Estimated: 46 mL/min — ABNORMAL LOW (ref >60)
Glucose, Bld: 229 mg/dL — ABNORMAL HIGH (ref 70–99)
Potassium: 3.6 mmol/L (ref 3.5–5.1)
Sodium: 133 mmol/L — ABNORMAL LOW (ref 135–145)
Total Bilirubin: 0.5 mg/dL (ref 0.3–1.2)
Total Protein: 8.7 g/dL — ABNORMAL HIGH (ref 6.5–8.1)

## 2020-12-12 LAB — CBC WITH DIFFERENTIAL/PLATELET
Abs Immature Granulocytes: 0.02 10*3/uL (ref 0.00–0.07)
Basophils Absolute: 0 10*3/uL (ref 0.0–0.1)
Basophils Relative: 0 %
Eosinophils Absolute: 0.1 10*3/uL (ref 0.0–0.5)
Eosinophils Relative: 3 %
HCT: 41.2 % (ref 39.0–52.0)
Hemoglobin: 14.8 g/dL (ref 13.0–17.0)
Immature Granulocytes: 0 %
Lymphocytes Relative: 27 %
Lymphs Abs: 1.6 10*3/uL (ref 0.7–4.0)
MCH: 33 pg (ref 26.0–34.0)
MCHC: 35.9 g/dL (ref 30.0–36.0)
MCV: 91.8 fL (ref 80.0–100.0)
Monocytes Absolute: 0.6 10*3/uL (ref 0.1–1.0)
Monocytes Relative: 10 %
Neutro Abs: 3.4 10*3/uL (ref 1.7–7.7)
Neutrophils Relative %: 60 %
Platelets: 207 10*3/uL (ref 150–400)
RBC: 4.49 MIL/uL (ref 4.22–5.81)
RDW: 11.8 % (ref 11.5–15.5)
WBC: 5.7 10*3/uL (ref 4.0–10.5)
nRBC: 0 % (ref 0.0–0.2)

## 2020-12-12 LAB — CBG MONITORING, ED
Glucose-Capillary: 221 mg/dL — ABNORMAL HIGH (ref 70–99)
Glucose-Capillary: 185 mg/dL — ABNORMAL HIGH (ref 70–99)
Glucose-Capillary: 190 mg/dL — ABNORMAL HIGH (ref 70–99)

## 2020-12-12 LAB — RESP PANEL BY RT-PCR (FLU A&B, COVID) ARPGX2
Influenza A by PCR: NEGATIVE
Influenza B by PCR: NEGATIVE
SARS Coronavirus 2 by RT PCR: NEGATIVE

## 2020-12-12 LAB — GLUCOSE, CAPILLARY
Glucose-Capillary: 271 mg/dL — ABNORMAL HIGH (ref 70–99)
Glucose-Capillary: 292 mg/dL — ABNORMAL HIGH (ref 70–99)

## 2020-12-12 LAB — LACTIC ACID, PLASMA
Lactic Acid, Venous: 1.4 mmol/L (ref 0.5–1.9)
Lactic Acid, Venous: 1.2 mmol/L (ref 0.5–1.9)

## 2020-12-12 SURGERY — INCISION AND DRAINAGE, ABSCESS
Anesthesia: General | Site: Perineum

## 2020-12-12 MED ORDER — MIDAZOLAM HCL 2 MG/2ML IJ SOLN
INTRAMUSCULAR | Status: AC
  Filled 2020-12-12: qty 2

## 2020-12-12 MED ORDER — FENTANYL CITRATE (PF) 100 MCG/2ML IJ SOLN
INTRAMUSCULAR | Status: DC | PRN
  Administered 2020-12-12 (×4): 25 ug via INTRAVENOUS

## 2020-12-12 MED ORDER — KETOROLAC TROMETHAMINE 30 MG/ML IJ SOLN
30.0000 mg | Freq: Four times a day (QID) | INTRAMUSCULAR | Status: DC
  Administered 2020-12-12 – 2020-12-13 (×4): 30 mg via INTRAVENOUS
  Filled 2020-12-12 (×3): qty 1

## 2020-12-12 MED ORDER — SODIUM CHLORIDE 0.9 % IV SOLN
INTRAVENOUS | Status: DC | PRN
  Administered 2020-12-12: 15 mL/h via INTRAVENOUS

## 2020-12-12 MED ORDER — GLYCOPYRROLATE 0.2 MG/ML IJ SOLN
INTRAMUSCULAR | Status: DC | PRN
  Administered 2020-12-12: .2 mg via INTRAVENOUS

## 2020-12-12 MED ORDER — IOHEXOL 300 MG/ML  SOLN
100.0000 mL | Freq: Once | INTRAMUSCULAR | Status: AC | PRN
  Administered 2020-12-12: 100 mL via INTRAVENOUS
  Filled 2020-12-12: qty 100

## 2020-12-12 MED ORDER — FENTANYL CITRATE (PF) 100 MCG/2ML IJ SOLN
INTRAMUSCULAR | Status: AC
  Administered 2020-12-12: 25 ug via INTRAVENOUS
  Filled 2020-12-12: qty 2

## 2020-12-12 MED ORDER — MIDAZOLAM HCL 2 MG/2ML IJ SOLN
INTRAMUSCULAR | Status: DC | PRN
  Administered 2020-12-12: 2 mg via INTRAVENOUS

## 2020-12-12 MED ORDER — ONDANSETRON HCL 4 MG/2ML IJ SOLN
INTRAMUSCULAR | Status: AC
  Filled 2020-12-12: qty 2

## 2020-12-12 MED ORDER — IPRATROPIUM-ALBUTEROL 0.5-2.5 (3) MG/3ML IN SOLN
RESPIRATORY_TRACT | Status: AC
  Filled 2020-12-12: qty 3

## 2020-12-12 MED ORDER — OXYCODONE HCL 5 MG PO TABS
5.0000 mg | ORAL_TABLET | ORAL | Status: DC | PRN
  Administered 2020-12-12: 5 mg via ORAL

## 2020-12-12 MED ORDER — ONDANSETRON 4 MG PO TBDP
4.0000 mg | ORAL_TABLET | Freq: Four times a day (QID) | ORAL | Status: DC | PRN
Start: 2020-12-12 — End: 2020-12-13

## 2020-12-12 MED ORDER — ONDANSETRON HCL 4 MG/2ML IJ SOLN: 4.0000 mg | Freq: Once | INTRAMUSCULAR | Status: DC | PRN

## 2020-12-12 MED ORDER — PANTOPRAZOLE SODIUM 40 MG IV SOLR
40.0000 mg | Freq: Every day | INTRAVENOUS | Status: DC
  Administered 2020-12-12: 40 mg via INTRAVENOUS
  Filled 2020-12-12: qty 40

## 2020-12-12 MED ORDER — ETOMIDATE 2 MG/ML IV SOLN
INTRAVENOUS | Status: DC | PRN
  Administered 2020-12-12: 10 mg via INTRAVENOUS
  Administered 2020-12-12: 20 mg via INTRAVENOUS
  Administered 2020-12-12: 10 mg via INTRAVENOUS

## 2020-12-12 MED ORDER — PIPERACILLIN-TAZOBACTAM 3.375 G IVPB
3.3750 g | Freq: Three times a day (TID) | INTRAVENOUS | Status: DC
  Administered 2020-12-12 – 2020-12-13 (×3): 3.375 g via INTRAVENOUS

## 2020-12-12 MED ORDER — INSULIN ASPART 100 UNIT/ML ~~LOC~~ SOLN
0.0000 [IU] | SUBCUTANEOUS | Status: DC
  Administered 2020-12-12: 11 [IU] via SUBCUTANEOUS
  Administered 2020-12-13: 4 [IU] via SUBCUTANEOUS
  Administered 2020-12-13: 11 [IU] via SUBCUTANEOUS
  Administered 2020-12-13: 15 [IU] via SUBCUTANEOUS
  Administered 2020-12-13: 11 [IU] via SUBCUTANEOUS
  Filled 2020-12-12 (×5): qty 1

## 2020-12-12 MED ORDER — DEXAMETHASONE SODIUM PHOSPHATE 10 MG/ML IJ SOLN
INTRAMUSCULAR | Status: AC
  Filled 2020-12-12: qty 1

## 2020-12-12 MED ORDER — INSULIN ASPART 100 UNIT/ML ~~LOC~~ SOLN
0.0000 [IU] | Freq: Three times a day (TID) | SUBCUTANEOUS | Status: DC
Start: 2020-12-12 — End: 2020-12-12

## 2020-12-12 MED ORDER — MIDODRINE HCL 5 MG PO TABS
5.0000 mg | ORAL_TABLET | Freq: Three times a day (TID) | ORAL | Status: DC
  Administered 2020-12-12 – 2020-12-13 (×3): 5 mg via ORAL
  Filled 2020-12-12 (×3): qty 1

## 2020-12-12 MED ORDER — SODIUM CHLORIDE 0.9 % IV SOLN: INTRAVENOUS | Status: DC

## 2020-12-12 MED ORDER — PROPOFOL 10 MG/ML IV BOLUS
INTRAVENOUS | Status: AC
  Filled 2020-12-12: qty 40

## 2020-12-12 MED ORDER — ONDANSETRON HCL 4 MG/2ML IJ SOLN
4.0000 mg | Freq: Four times a day (QID) | INTRAMUSCULAR | Status: DC | PRN
  Administered 2020-12-12: 4 mg via INTRAVENOUS

## 2020-12-12 MED ORDER — FENTANYL CITRATE (PF) 100 MCG/2ML IJ SOLN
25.0000 ug | INTRAMUSCULAR | Status: DC | PRN
Start: 2020-12-12 — End: 2020-12-12
  Administered 2020-12-12 (×3): 25 ug via INTRAVENOUS

## 2020-12-12 MED ORDER — PIPERACILLIN-TAZOBACTAM 3.375 G IVPB
INTRAVENOUS | Status: AC
  Filled 2020-12-12: qty 50

## 2020-12-12 MED ORDER — IOHEXOL 300 MG/ML  SOLN
100.0000 mL | Freq: Once | INTRAMUSCULAR | Status: DC | PRN
  Filled 2020-12-12: qty 100

## 2020-12-12 MED ORDER — MORPHINE SULFATE (PF) 2 MG/ML IV SOLN
2.0000 mg | INTRAVENOUS | Status: DC | PRN
  Administered 2020-12-12: 2 mg via INTRAVENOUS
  Filled 2020-12-12: qty 1

## 2020-12-12 MED ORDER — IPRATROPIUM-ALBUTEROL 0.5-2.5 (3) MG/3ML IN SOLN
3.0000 mL | Freq: Once | RESPIRATORY_TRACT | Status: AC
  Administered 2020-12-12: 3 mL via RESPIRATORY_TRACT

## 2020-12-12 MED ORDER — OXYCODONE HCL 5 MG PO TABS
ORAL_TABLET | ORAL | Status: AC
  Filled 2020-12-12: qty 1

## 2020-12-12 MED ORDER — DEXAMETHASONE SODIUM PHOSPHATE 10 MG/ML IJ SOLN
INTRAMUSCULAR | Status: DC | PRN
  Administered 2020-12-12: 8 mg via INTRAVENOUS

## 2020-12-12 MED ORDER — FENTANYL CITRATE (PF) 100 MCG/2ML IJ SOLN
INTRAMUSCULAR | Status: AC
  Filled 2020-12-12: qty 2

## 2020-12-12 MED ORDER — LIDOCAINE HCL (PF) 2 % IJ SOLN
INTRAMUSCULAR | Status: AC
  Filled 2020-12-12: qty 5

## 2020-12-12 MED ORDER — KETOROLAC TROMETHAMINE 30 MG/ML IJ SOLN
INTRAMUSCULAR | Status: AC
  Filled 2020-12-12: qty 1

## 2020-12-12 SURGICAL SUPPLY — 21 items
BLADE CLIPPER SURG (BLADE) ×2 IMPLANT
BLADE SURG 15 STRL LF DISP TIS (BLADE) ×1 IMPLANT
BLADE SURG 15 STRL SS (BLADE) ×1
BRUSH SCRUB EZ  4% CHG (MISCELLANEOUS) ×1
BRUSH SCRUB EZ 4% CHG (MISCELLANEOUS) ×1 IMPLANT
CANISTER SUCT 3000ML PPV (MISCELLANEOUS) ×2 IMPLANT
COVER WAND RF STERILE (DRAPES) ×2 IMPLANT
DRAPE LAPAROTOMY 77X122 PED (DRAPES) ×2 IMPLANT
ELECT REM PT RETURN 9FT ADLT (ELECTROSURGICAL) ×2
ELECTRODE REM PT RTRN 9FT ADLT (ELECTROSURGICAL) ×1 IMPLANT
GAUZE PACKING IODOFORM 1/2 (PACKING) ×2 IMPLANT
GLOVE SURG ENC MOIS LTX SZ7 (GLOVE) ×2 IMPLANT
GOWN STRL REUS W/ TWL LRG LVL3 (GOWN DISPOSABLE) ×2 IMPLANT
GOWN STRL REUS W/TWL LRG LVL3 (GOWN DISPOSABLE) ×2
MANIFOLD NEPTUNE II (INSTRUMENTS) ×2 IMPLANT
NEEDLE HYPO 22GX1.5 SAFETY (NEEDLE) ×2 IMPLANT
NS IRRIG 1000ML POUR BTL (IV SOLUTION) ×2 IMPLANT
PACK BASIN MINOR ARMC (MISCELLANEOUS) ×2 IMPLANT
SOL PREP PVP 2OZ (MISCELLANEOUS) ×2
SOLUTION PREP PVP 2OZ (MISCELLANEOUS) ×1 IMPLANT
SPONGE LAP 18X18 RF (DISPOSABLE) ×2 IMPLANT

## 2020-12-12 NOTE — H&P (Signed)
[facility] SURGICAL HISTORY & PHYSICAL (cpt [id number])  HISTORY OF PRESENT ILLNESS (HPI):  45 y.o. male presented to [facility] ED today for abscess. Patient reports he believe he has had a small perineal abscess for about 8-9 months now however it has typically been very small and would spontaneously drain, which provided him intermittent relief from this. Unfortunately, in the last few days, he has had significant worsening of this. He denied any fever, chills at home. He does have diabetes but this is relatively controlled at home. He does have a history of similar abscess around 10 years ago which needed drained. Of note, did have a recent admission in March for what appears to be CHF exacerbation. He has not taken his Xarelto in 10 days. Work up in the ED revealed relatively reassuring labs aside from baseline renal disease and hyperglycemia consistent with DM. However, CT Pelvis was concerning for perineal abscess.   General surgery is consulted by emergency medicine provider Ilean Skill, PA-C for evaluation and management of perineal abscess.    PAST MEDICAL HISTORY (PMH):  Past Medical History:  Diagnosis Date  . AICD (automatic cardioverter/defibrillator) present   . Asthma   . Cardiomyopathy ([place])   . CHF (congestive heart failure) ([place])   . Coronary artery disease   . Deafness in right ear   . Diabetes mellitus without complication ([place])   . Dilated cardiomyopathy ([place])   . Dysrhythmia    svt  . 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 GERD (gastroesophageal reflux disease)   . Hyperlipidemia   . Hypertension   . Hypoxemia   . Hypoxemia   . Mild obesity   . Myocardial infarction ([place])    6599,3570,1/77  . Pancreatitis   . Sleep apnea    osa    Reviewed. Otherwise negative.   PAST SURGICAL HISTORY ([place]):  Past Surgical History:  Procedure Laterality Date  . CARDIAC CATHETERIZATION  12/11/2014   Procedure: RIGHT/LEFT HEART CATH AND CORONARY ANGIOGRAPHY;   Surgeon: Lorretta Harp, MD;  Location: [facility] CATH LAB;  Service: Cardiovascular;;  . ICD LEAD REMOVAL N/A 03/30/2015   Procedure: ICD LEAD REMOVAL;  Surgeon: Marzetta Board, MD;  Location: [facility] ORS;  Service: Cardiovascular;  Laterality: N/A;  . IMPLANTABLE CARDIOVERTER DEFIBRILLATOR IMPLANT    . INSERT / REPLACE / REMOVE PACEMAKER    . LEFT HEART CATHETERIZATION WITH CORONARY ANGIOGRAM N/A 12/09/2014   Procedure: LEFT HEART CATHETERIZATION WITH CORONARY ANGIOGRAM;  Surgeon: Burnell Blanks, MD;  Location: [facility] CATH LAB;  Service: Cardiovascular;  Laterality: N/A;    Reviewed. Otherwise negative.   MEDICATIONS:  Prior to Admission medications   Medication Sig Start Date End Date Taking? Authorizing Provider  amiodarone (PACERONE) 200 MG tablet Take 200 mg by mouth 2 (two) times daily. 11/15/20 11/29/20  [provider]  atorvastatin (LIPITOR) 40 MG tablet Take 1 tablet (40 mg total) by mouth daily at 6 PM. Patient taking differently: Take 40 mg by mouth at bedtime. 11/04/18   Alisa Graff, FNP  carvedilol (COREG) 3.125 MG tablet Take 1 tablet (3.125 mg total) by mouth 2 (two) times daily with a meal. 11/25/20   Sharen Hones, MD  empagliflozin (JARDIANCE) 10 MG TABS tablet Take 10 mg by mouth daily.    [provider]  pantoprazole (PROTONIX) 40 MG tablet Take 1 tablet (40 mg total) by mouth daily. 11/25/20 12/25/20  Sharen Hones, MD  rivaroxaban (XARELTO) 20 MG TABS tablet Take 20  mg by mouth daily with supper.    [provider]  sacubitril-valsartan (ENTRESTO) 97-103 MG Take 1 tablet by mouth 2 (two) times daily. 08/13/20   Alisa Graff, FNP  tiotropium (SPIRIVA) 18 MCG inhalation capsule Place 18 mcg into inhaler and inhale daily.    [provider]  torsemide 40 MG TABS Take 40 mg by mouth 2 (two) times daily. 11/27/20   Lorella Nimrod, MD  umeclidinium-vilanterol (ANORO ELLIPTA) 62.5-25 MCG/INH AEPB Inhale 1 puff into the lungs daily in the afternoon.  01/31/19   [provider]  VENTOLIN HFA 108 (90 Base) MCG/ACT inhaler Inhale into the lungs. 11/23/20   [provider]     ALLERGIES:  Allergies  Allergen Reactions  . Bidil [Isosorb Dinitrate-Hydralazine] Other (See Comments)    Migraine Headache Migraine Headache  . Ciprofloxacin Other (See Comments)    Migraine Headache     SOCIAL HISTORY:  Social History   Socioeconomic History  . Marital status: Single    Spouse name: Not on file  . Number of children: Not on file  . Years of education: Not on file  . Highest education level: Not on file  Occupational History  . Occupation: unemployed  Tobacco Use  . Smoking status: Current Every Day Smoker    Packs/day: 0.50    Years: 24.00    Pack years: 12.00    Types: Cigarettes  . Smokeless tobacco: Never Used  . Tobacco comment: 4/5 Smoking 5 cigs a day  Vaping Use  . Vaping Use: Never used  Substance and Sexual Activity  . Alcohol use: No  . Drug use: No  . Sexual activity: Yes  Other Topics Concern  . Not on file  Social History Narrative  . Not on file   Social Determinants of Health   Financial Resource Strain: Not on file  Food Insecurity: Not on file  Transportation Needs: Not on file  Physical Activity: Not on file  Stress: Not on file  Social Connections: Not on file  Intimate Partner Violence: Not on file     FAMILY HISTORY:  Family History  Problem Relation Age of Onset  . Hypertension Mother   . Congestive Heart Failure Mother   . Hypertension Sister   . Diabetes Sister   . Pancreatitis Sister   . COPD Sister   . Pancreatitis Brother   . Anemia Neg Hx   . Arrhythmia Neg Hx   . Asthma Neg Hx   . Clotting disorder Neg Hx   . Fainting Neg Hx   . Heart attack Neg Hx   . Heart disease Neg Hx   . Heart failure Neg Hx   . Hyperlipidemia Neg Hx     Otherwise negative.   REVIEW OF SYSTEMS:  Review of Systems  Constitutional: Negative for chills and fever.  Respiratory:  Negative for cough and shortness of breath.   Cardiovascular: Negative for chest pain and palpitations.  Gastrointestinal: Negative for abdominal pain, nausea and vomiting.  Genitourinary: Negative for dysuria and urgency.  Skin:       + Perineal Abscess  All other systems reviewed and are negative.   VITAL SIGNS:  Temp:  [98.3 F (36.8 C)] 98.3 F (36.8 C) (04/13 0900) Pulse Rate:  [74-92] 74 (04/13 1146) Resp:  [18-20] 18 (04/13 1146) BP: (104-112)/(75-78) 104/77 (04/13 1146) SpO2:  [97 %-100 %] 100 % (04/13 1146) Weight:  [133.4 kg] 133.4 kg (04/13 0901)     Height: 5\' 6"  (167.6 cm)  Weight: 133.4 kg BMI (Calculated): 47.48   PHYSICAL EXAM:  Physical Exam Vitals and nursing note reviewed.  Constitutional:      General: He is not in acute distress.    Appearance: Normal appearance. He is obese. He is not ill-appearing.  HENT:     Head: Normocephalic and atraumatic.  Eyes:     General: No scleral icterus.    Conjunctiva/sclera: Conjunctivae normal.  Pulmonary:     Effort: No respiratory distress.     Comments: On [place] Genitourinary:    Comments: Induration and fluctuance to the left perineum just lateral to the scrotum, this is exquisitely tender, I do not appreciate any areas of crepitus or drainage, no evidence of necrotizing infection at this time Musculoskeletal:     Right lower leg: No edema.     Left lower leg: No edema.  Skin:    General: Skin is warm and dry.  Neurological:     General: No focal deficit present.     Mental Status: He is alert and oriented to person, place, and time.  Psychiatric:        Mood and Affect: Mood normal.        Behavior: Behavior normal.     INTAKE/OUTPUT:  This shift: No intake/output data recorded.  Last 2 shifts: @IOLAST2SHIFTS @  Labs:  CBC Latest Ref Rng & Units 12/12/2020 11/27/2020 11/26/2020  WBC 4.0 - 10.5 K/uL 5.7 5.3 6.3  Hemoglobin 13.0 - 17.0 g/dL 14.8 13.6 13.8  Hematocrit 39.0 - 52.0 % 41.2 39.4 39.7  Platelets  150 - 400 K/uL 207 126(L) 140(L)   CMP Latest Ref Rng & Units 12/12/2020 11/27/2020 11/26/2020  Glucose 70 - 99 mg/dL 229(H) 212(H) 140(H)  BUN 6 - 20 mg/dL 54(H) 35(H) 36(H)  Creatinine 0.61 - 1.24 mg/dL 1.84(H) 1.67(H) 1.74(H)  Sodium 135 - 145 mmol/L 133(L) 139 140  Potassium 3.5 - 5.1 mmol/L 3.6 3.6 4.0  Chloride 98 - 111 mmol/L 93(L) 98 98  CO2 22 - 32 mmol/L 27 33(H) 33(H)  Calcium 8.9 - 10.3 mg/dL 8.8(L) 8.5(L) 8.5(L)  Total Protein 6.5 - 8.1 g/dL 8.7(H) - -  Total Bilirubin 0.3 - 1.2 mg/dL 0.5 - -  Alkaline Phos 38 - 126 U/L 230(H) - -  AST 15 - 41 U/L 46(H) - -  ALT 0 - 44 U/L 44 - -     Imaging studies:   CT Pelvis (12/12/2020) personally reviewed which shows left sided perineal fluid collection, concerning for abscess, without subcutaneous air or signs of necrotizing infection, and radiologist report reviewed below:  IMPRESSION: 1. Approximately 45 cubic cm abscess along the left perineum and posterior base of the left scrotum. There is some mild surrounding inflammatory findings but we do not demonstrate the diffuse aggressive soft tissue necrosis appearance of Fournier's gangrene at this time. The patient does have enhanced risk of developing Fournier's gangrene based on history of diabetes and careful correlation with clinical scenario (rapidity of progression, physical exam inspection) is recommended. 2.  Aortic Atherosclerosis (ICD10-I70.0). 3. Likely reactive lower pelvic lymph nodes. 4. Umbilical hernia containing adipose tissue. 5. Bilateral foraminal impingement at L4-5 and L5-S1.    Assessment/Plan: (ICD-10's: L25.215) 45 y.o. male with likely perineal abscess, complicated by pertinent comorbidities including DM, HTN, HLD, CHF, and morbid obesity.    - Will plan on admission to general surgery  - Will plan on I&D in the OR this afternoon with dr Dahlia Byes pending OR/Anthesia availability  - All risks, benefits, and alternatives  to above procedure(s) were  discussed with the patient, all of his questions were answered to his expressed satisfaction, patient expresses he wishes to proceed, and informed consent was obtained.  - NPO + IVF Resuscitation  - IV Abx (Zosyn)  - Pain control prn  - Hold home medications for OR; restart post-operatively    - DVT prophylaxis; hold for OR  All of the above findings and recommendations were discussed with the patient, and all of his questions were answered to his expressed satisfaction.  -- Edison Simon, PA-C [facility] 12/12/2020, 12:12 PM [phone] M-F: 7am - 4pm

## 2020-12-12 NOTE — Anesthesia Preprocedure Evaluation (Signed)
Anesthesia Evaluation  Patient identified by MRN, date of birth, ID band Patient awake    Reviewed: Allergy & Precautions, H&P , NPO status , Patient's Chart, lab work & pertinent test results, reviewed documented beta blocker date and time   Airway Mallampati: III  TM Distance: >3 FB Neck ROM: full    Dental  (+) Teeth Intact, Poor Dentition   Pulmonary asthma , sleep apnea and Continuous Positive Airway Pressure Ventilation , COPD,  COPD inhaler, Current Smoker and Patient abstained from smoking.,    Pulmonary exam normal        Cardiovascular Exercise Tolerance: Poor hypertension, + CAD, + Past MI, + Cardiac Stents and +CHF  Normal cardiovascular exam+ dysrhythmias + Cardiac Defibrillator  Rate:Normal     Neuro/Psych negative neurological ROS  negative psych ROS   GI/Hepatic Neg liver ROS, GERD  Medicated,  Endo/Other  diabetes, Poorly ControlledMorbid obesity  Renal/GU Renal disease  negative genitourinary   Musculoskeletal   Abdominal   Peds  Hematology negative hematology ROS (+)   Anesthesia Other Findings   Reproductive/Obstetrics negative OB ROS                             Anesthesia Physical Anesthesia Plan  ASA: IV and emergent  Anesthesia Plan: General LMA   Post-op Pain Management:    Induction:   PONV Risk Score and Plan: 3  Airway Management Planned:   Additional Equipment:   Intra-op Plan:   Post-operative Plan:   Informed Consent: I have reviewed the patients History and Physical, chart, labs and discussed the procedure including the risks, benefits and alternatives for the proposed anesthesia with the patient or authorized representative who has indicated his/her understanding and acceptance.       Plan Discussed with: CRNA  Anesthesia Plan Comments:         Anesthesia Quick Evaluation

## 2020-12-12 NOTE — ED Notes (Signed)
Call bell in place, pt placed on monitor and 3L [place] as worn at home.

## 2020-12-12 NOTE — Op Note (Signed)
  12/12/2020  3:49 PM  PATIENT:  Oscar [name] Fudala  45 y.o. male  PRE-OPERATIVE DIAGNOSIS:  Perirectal abscess   POST-OPERATIVE DIAGNOSIS:  Same  PROCEDURE:   1. Incision and drainage of complex perirectal abscess 2. Excisional debridement of skin subcutaneous tissue and muscle measuring 9 square centimeters ( 3x3 cms)    SURGEON:  Surgeon(s) and Role:    * Gerre Ranum F, MD - Primary  FINDINGS: large anterior midline perirectal abscess extending into the scrotum   ANESTHESIA: General  EBL: 10cc  DICTATION:  Patient was explayined about the  Procedure in detail. Risks, benefits  And possible complications and a consent was obtained. The patient taken to the operating room and placed in the lithotomy position all pressure points padded. Sam reveal evidence of large perirectal abscess anterior midline that was extending into the scrotum.  Appropriate cultures were obtained after 15 blade was used to incise the cavity.  There was around 30 cc of pus.  They were loculations that were lysed with the suction tape.  Curette was used to perform excisional  debridement of the subcutaneous tissue and muscle.  Adequate hemostasis was obtained with electrocautery.  Cavity was irrigated with saline and half inch iodoform packing placed.  Needle and laparotomy counts were correct and there were no immediate complications  Jules Husbands, MD

## 2020-12-12 NOTE — Consult Note (Addendum)
Medical Consultation   Oscar [name]  NUU:[id number]  DOB: 01-09-1976  DOA: 12/12/2020  PCP: [facility]     Requesting physician: Dr. Dahlia Byes  Reason for consultation: Medical management   History of Present Illness: Oscar [name] is an 45 y.o. male with history of asthma,heart failure reduced ejection fraction, obesity, hypertension, status post dual-lead ICD, atrial fibrillation, obstructive sleep apnea on CPAP, prolonged QT, GERD, presented to [facility] ED today for an abscess. Patient reports he believe he has had a small perineal abscess for about 8-9 months now however it has typically been very small and would spontaneously drain, which provided him intermittent relief from this. Unfortunately, in the last few days, he has had significant worsening of this. He denied any fever, chills at home. He does have diabetes but this is relatively controlled at home. He does have a history of similar abscess around 10 years ago which needed drained. Of note, did have a recent admission in March for what appears to be CHF exacerbation. He has not taken his Xarelto in 10 days. Work up in the ED revealed relatively reassuring labs aside from baseline renal disease and hyperglycemia consistent with DM. However, CT Pelvis was concerning for perineal abscess. He was taken to OR for I/D of perirectal abscess by Dr. Dahlia Byes today.   [facility] was consulted for medical management of his comorbidities.  Patient was seen in PACU .    Review of Systems:  Review of Systems  All other systems reviewed and are negative.  All systems reviewed and otherwise negative  Past Medical History: Past Medical History:  Diagnosis Date  . AICD (automatic cardioverter/defibrillator) present   . Asthma   . Cardiomyopathy ([place])   . CHF (congestive heart failure) ([place])   . Coronary artery disease   . Deafness in right ear   . Diabetes mellitus without complication ([place])   .  Dilated cardiomyopathy ([place])   . Dysrhythmia    svt  . 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 GERD (gastroesophageal reflux disease)   . Hyperlipidemia   . Hypertension   . Hypoxemia   . Hypoxemia   . Mild obesity   . Myocardial infarction ([place])    [id number],6/38  . Pancreatitis   . Sleep apnea    osa    Past Surgical History: Past Surgical History:  Procedure Laterality Date  . CARDIAC CATHETERIZATION  12/11/2014   Procedure: RIGHT/LEFT HEART CATH AND CORONARY ANGIOGRAPHY;  Surgeon: Lorretta Harp, MD;  Location: [facility] CATH LAB;  Service: Cardiovascular;;  . ICD LEAD REMOVAL N/A 03/30/2015   Procedure: ICD LEAD REMOVAL;  Surgeon: Marzetta Board, MD;  Location: [facility] ORS;  Service: Cardiovascular;  Laterality: N/A;  . IMPLANTABLE CARDIOVERTER DEFIBRILLATOR IMPLANT    . INSERT / REPLACE / REMOVE PACEMAKER    . LEFT HEART CATHETERIZATION WITH CORONARY ANGIOGRAM N/A 12/09/2014   Procedure: LEFT HEART CATHETERIZATION WITH CORONARY ANGIOGRAM;  Surgeon: Burnell Blanks, MD;  Location: [facility] CATH LAB;  Service: Cardiovascular;  Laterality: N/A;     Allergies:   Allergies  Allergen Reactions  . Ciprofloxacin Other (See Comments) and Swelling    Migraine Headache Migraine Headache  . Isosorb Dinitrate-Hydralazine Other (See Comments)    Migraine Headache Migraine Headache Other reaction(s): Other (See Comments) Migraine Headache Other reaction(s): Other (See Comments) Migraine Headache Migraine Headache Migraine Headache Migraine Headache Migraine Headache  Migraine Headache     Social History:  reports that he has been smoking cigarettes. He has a 12.00 pack-year smoking history. He has never used smokeless tobacco. He reports that he does not drink alcohol and does not use drugs.   Family History: Family History  Problem Relation Age of Onset  . Hypertension Mother   . Congestive Heart Failure Mother   . Hypertension Sister   . Diabetes Sister   .  Pancreatitis Sister   . COPD Sister   . Pancreatitis Brother   . Anemia Neg Hx   . Arrhythmia Neg Hx   . Asthma Neg Hx   . Clotting disorder Neg Hx   . Fainting Neg Hx   . Heart attack Neg Hx   . Heart disease Neg Hx   . Heart failure Neg Hx   . Hyperlipidemia Neg Hx        Physical Exam: Vitals:   12/12/20 1556 12/12/20 1600 12/12/20 1610 12/12/20 1615  BP: 123/83 112/80  95/69  Pulse: 94 94 87 88  Resp: (!) 22 13 16 17   Temp: 98.4 F (36.9 C)     TempSrc:      SpO2: 95% 91% 92% (!) 89%  Weight:      Height:        Constitutional: Alert and awake, oriented x3, not in any acute distress.mildly sleepy  from anesthesia, but cooperative and answers questions HEENT: EOMI Neck: soft,no masses, normal ROM, no JVD  CVS: Regular S1-S2 clear, no murmur rubs or gallops Respiratory:  clear to auscultation bilaterally, no wheezing, rales or rhonchi. Respiratory effort normal.  Abdomen: soft nontender, nondistended, normal bowel sounds Musculoskeletal: : no cyanosis,or edema noted bilaterally Neuro: aaxox3. Grossly intact Psych: judgement and insight appear normal, stable mood and affect, mental status Skin: warm,dry   Data reviewed:  I have personally reviewed following labs and imaging studies Labs:  CBC: Recent Labs  Lab 12/12/20 0941  WBC 5.7  NEUTROABS 3.4  HGB 14.8  HCT 41.2  MCV 91.8  PLT 154    Basic Metabolic Panel: Recent Labs  Lab 12/12/20 0941  NA 133*  K 3.6  CL 93*  CO2 27  GLUCOSE 229*  BUN 54*  CREATININE 1.84*  CALCIUM 8.8*   GFR Estimated Creatinine Clearance: 65.7 mL/min (A) (by C-G formula based on SCr of 1.84 mg/dL (H)). Liver Function Tests: Recent Labs  Lab 12/12/20 0941  AST 46*  ALT 44  ALKPHOS 230*  BILITOT 0.5  PROT 8.7*  ALBUMIN 3.4*   No results for input(s): LIPASE, AMYLASE in the last 168 hours. No results for input(s): AMMONIA in the last 168 hours. Coagulation profile No results for input(s): INR, PROTIME in the  last 168 hours.  Cardiac Enzymes: No results for input(s): CKTOTAL, CKMB, CKMBINDEX, TROPONINI in the last 168 hours. BNP: Invalid input(s): POCBNP CBG: Recent Labs  Lab 12/12/20 0922 12/12/20 1441 12/12/20 1600  GLUCAP 221* 185* 190*   D-Dimer No results for input(s): DDIMER in the last 72 hours. Hgb A1c No results for input(s): HGBA1C in the last 72 hours. Lipid Profile No results for input(s): CHOL, HDL, LDLCALC, TRIG, CHOLHDL, LDLDIRECT in the last 72 hours. Thyroid function studies No results for input(s): TSH, T4TOTAL, T3FREE, THYROIDAB in the last 72 hours.  Invalid input(s): FREET3 Anemia work up No results for input(s): VITAMINB12, FOLATE, FERRITIN, TIBC, IRON, RETICCTPCT in the last 72 hours. Urinalysis    Component Value Date/Time   COLORURINE YELLOW (A) 04/12/2020  0804   APPEARANCEUR HAZY (A) 04/12/2020 0804   APPEARANCEUR Hazy 08/21/2014 0800   LABSPEC 1.006 04/12/2020 0804   LABSPEC 1.015 08/21/2014 0800   PHURINE 5.0 04/12/2020 0804   GLUCOSEU NEGATIVE 04/12/2020 0804   GLUCOSEU Negative 08/21/2014 0800   HGBUR NEGATIVE 04/12/2020 0804   BILIRUBINUR NEGATIVE 04/12/2020 0804   BILIRUBINUR Negative 08/21/2014 0800   KETONESUR 5 (A) 04/12/2020 0804   PROTEINUR 30 (A) 04/12/2020 0804   NITRITE NEGATIVE 04/12/2020 0804   LEUKOCYTESUR NEGATIVE 04/12/2020 0804   LEUKOCYTESUR 1+ 08/21/2014 0800     Sepsis Labs Invalid input(s): PROCALCITONIN,  WBC,  LACTICIDVEN Microbiology Recent Results (from the past 240 hour(s))  Resp Panel by RT-PCR (Flu A&B, Covid) Nasopharyngeal Swab     Status: None   Collection Time: 12/12/20 12:58 PM   Specimen: Nasopharyngeal Swab; Nasopharyngeal(NP) swabs in vial transport medium  Result Value Ref Range Status   SARS Coronavirus 2 by RT PCR NEGATIVE NEGATIVE Final    Comment: (NOTE) SARS-CoV-2 target nucleic acids are NOT DETECTED.  The SARS-CoV-2 RNA is generally detectable in upper respiratory specimens during the  acute phase of infection. The lowest concentration of SARS-CoV-2 viral copies this assay can detect is 138 copies/mL. A negative result does not preclude SARS-Cov-2 infection and should not be used as the sole basis for treatment or other patient management decisions. A negative result may occur with  improper specimen collection/handling, submission of specimen other than nasopharyngeal swab, presence of viral mutation(s) within the areas targeted by this assay, and inadequate number of viral copies(<138 copies/mL). A negative result must be combined with clinical observations, patient history, and epidemiological information. The expected result is Negative.  Fact Sheet for Patients:  EntrepreneurPulse.com.au  Fact Sheet for Healthcare Providers:  IncredibleEmployment.be  This test is no t yet approved or cleared by the [place] FDA and  has been authorized for detection and/or diagnosis of SARS-CoV-2 by FDA under an Emergency Use Authorization (EUA). This EUA will remain  in effect (meaning this test can be used) for the duration of the COVID-19 declaration under Section 564(b)(1) of the Act, 21 U.S.C.section 360bbb-3(b)(1), unless the authorization is terminated  or revoked sooner.       Influenza A by PCR NEGATIVE NEGATIVE Final   Influenza B by PCR NEGATIVE NEGATIVE Final    Comment: (NOTE) The Xpert Xpress SARS-CoV-2/FLU/RSV plus assay is intended as an aid in the diagnosis of influenza from Nasopharyngeal swab specimens and should not be used as a sole basis for treatment. Nasal washings and aspirates are unacceptable for Xpert Xpress SARS-CoV-2/FLU/RSV testing.  Fact Sheet for Patients: EntrepreneurPulse.com.au  Fact Sheet for Healthcare Providers: IncredibleEmployment.be  This test is not yet approved or cleared by the [place] FDA and has been authorized for detection and/or  diagnosis of SARS-CoV-2 by FDA under an Emergency Use Authorization (EUA). This EUA will remain in effect (meaning this test can be used) for the duration of the COVID-19 declaration under Section 564(b)(1) of the Act, 21 U.S.C. section 360bbb-3(b)(1), unless the authorization is terminated or revoked.  Performed at [facility], [address] [place], [place] [address]        Inpatient Medications:   Scheduled Meds: . [MAR Hold] insulin aspart  0-20 Units Subcutaneous TID WC  . [MAR Hold] pantoprazole (PROTONIX) IV  40 mg Intravenous QHS   Continuous Infusions: . sodium chloride    . [MAR Hold] piperacillin-tazobactam (ZOSYN)  IV       Radiological Exams  on Admission: CT PELVIS W CONTRAST  Result Date: 12/12/2020 CLINICAL DATA:  Groin abscess/perineum x 8-9 months, progressively worsening. Diabetes. EXAM: CT PELVIS WITH CONTRAST TECHNIQUE: Multidetector CT imaging of the pelvis was performed using the standard protocol following the bolus administration of intravenous contrast. CONTRAST:  111mL OMNIPAQUE IOHEXOL 300 MG/ML  SOLN COMPARISON:  11/23/2020 CT angiogram of the pelvis. FINDINGS: Urinary Tract:  Unremarkable Bowel:  Unremarkable Vascular/Lymphatic: Aortoiliac atherosclerotic vascular disease. Right external iliac node 1.2 cm in short axis on image 33 series 2, formerly 1.1 cm. Smaller left iliac and bilateral inguinal lymph nodes are likely reactive. Reproductive: Along the left perineum and posterior base of the left scrotum, a 6.4 by 2.8 by 4.8 cm (volume = 45 cm^3) lesion with thick enhancing margins and central hypodensity favoring abscess is identified. There is no gas within this lesion. Surrounding low-grade inflammatory stranding along the perineum and base of the scrotum. This is not extending in a generalized fashion through the perineum or buttock me do not see compelling evidence of aggressive soft tissue necrosis/Fournier's gangrene at this time,  although given the patient's history of diabetes he is certainly at some risk. Other:  No supplemental non-categorized findings. Musculoskeletal: Umbilical hernia contains adipose tissue with herniated adipose tissue measuring 5.3 by 5.0 by 5.1 cm (volume = 71 cm^3). Lower lumbar spondylosis and degenerative disc disease causing bilateral foraminal impingement at L4-5 and L5-S1. IMPRESSION: 1. Approximately 45 cubic cm abscess along the left perineum and posterior base of the left scrotum. There is some mild surrounding inflammatory findings but we do not demonstrate the diffuse aggressive soft tissue necrosis appearance of Fournier's gangrene at this time. The patient does have enhanced risk of developing Fournier's gangrene based on history of diabetes and careful correlation with clinical scenario (rapidity of progression, physical exam inspection) is recommended. 2.  Aortic Atherosclerosis (ICD10-I70.0). 3. Likely reactive lower pelvic lymph nodes. 4. Umbilical hernia containing adipose tissue. 5. Bilateral foraminal impingement at L4-5 and L5-S1. Electronically Signed   By: Van Clines M.D.   On: 12/12/2020 11:34    Impression/Recommendations Active Problems:   Perineal abscess  1. Perirectal abscess- s/p I/D today by Dr. Dahlia Byes Further management per primary team On iv zosyn  2. Chronic Systolic HF-without acute exacerbation Euvolemic on exam Echo from  3/22 EF less than 20% postanesthesia his blood pressure is low but once improves need to restart carvedilol, Entresto and torsemide If giving IV fluids need to monitor volume status closely I's and O's Daily weight Can add midodrine until bp improves.    Diabetes mellitus type 2-on Jardiance A1c from 11/24/2020 was 7.8 Hold Jardiance Check fingersticks R-ISS Hypoglycemic protocol   Hx/o afib- on amiodarone Unsure why pt not taking a/c. Told me it was stopped?Marland Kitchen    OSA- on cpap- will have RT to bring cpap here  GERD- on  PPI  Tried calling number in chart for sister to get pt's medication list but no answer. Unable to place VM as it was not setup. Will consult pharmacy for med rec. Reconciliation.   Thank you for this consultation.  Our [facility] hospitalist team will follow the patient with you.   Time Spent: 45 min with >50% on coc.   Nolberto Hanlon M.D. [facility] 12/12/2020, 4:22 PM

## 2020-12-12 NOTE — ED Provider Notes (Signed)
[facility] Emergency Department Provider Note  ____________________________________________   Event Date/Time   First MD Initiated Contact with Patient 12/12/20 [phone]     (approximate)  I have reviewed the triage vital signs and the nursing notes.   HISTORY  Chief Complaint Abscess    HPI Oscar [name] is a 45 y.o. male with history of heart disease and diabetes along with several other comorbidities, presents to the emergency department with an abscess in the groin/perineum area.  Patient has history of diabetes.  States the area was more of a cyst 8 to 9 months ago and now has become an abscess.  States he ran a fever last week.  Had a recent admission to the ICU for his heart 2 weeks ago.  Patient states the area is very painful.  States he knows he will need to see a Psychologist, sport and exercise or someone to have this opened.  His primary care doctor attempted to numb it and lancet but sent him here because it is too painful.    Past Medical History:  Diagnosis Date  . AICD (automatic cardioverter/defibrillator) present   . Asthma   . Cardiomyopathy ([place])   . CHF (congestive heart failure) ([place])   . Coronary artery disease   . Deafness in right ear   . Diabetes mellitus without complication ([place])   . Dilated cardiomyopathy ([place])   . Dysrhythmia    svt  . 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 GERD (gastroesophageal reflux disease)   . Hyperlipidemia   . Hypertension   . Hypoxemia   . Hypoxemia   . Mild obesity   . Myocardial infarction ([place])    [id number],0/27  . Pancreatitis   . Sleep apnea    osa    Patient Active Problem List   Diagnosis Date Noted  . Flash pulmonary edema ([place])   . Pulmonary edema 11/25/2020  . Obesity, Class III, BMI 40-49.9 (morbid obesity) ([place]) 11/24/2020  . Acute on chronic diastolic (congestive) heart failure ([place]) 11/24/2020  . Acute exacerbation of CHF (congestive heart failure) ([place]) 11/23/2020  . Hepatic  cirrhosis, unspecified hepatic cirrhosis type, unspecified whether ascites present ([place]) 11/23/2020  . Type 2 diabetes mellitus with other diabetic neurological complication ([place]) 74/08/8785  . Obesity hypoventilation syndrome ([place]) 11/23/2020  . Atrial fibrillation with RVR ([place]) 11/23/2020  . ARF (acute renal failure) ([place]) 03/28/2020  . Acute on chronic combined systolic (congestive) and diastolic (congestive) heart failure ([place]) 02/27/2020  . COPD (chronic obstructive pulmonary disease) ([place]) 02/27/2020  . Sprain of foot 01/26/2020  . Foreign body granuloma of skin and subcutaneous tissue 05/26/2019  . Acute pancreatitis 05/19/2017  . Pancreatitis 09/12/2016  . Bronchitis 05/20/2016  . Constipation 11/08/2015  . Abdominal pain 11/02/2015  . Obstructive sleep apnea 07/31/2015  . Tobacco use 07/31/2015  . Diabetes ([place]) 07/10/2015  . Chronic systolic heart failure ([place]) 03/30/2015  . Cardiac pacemaker 03/30/2015  . Chronic respiratory failure with hypercapnia ([place]) 12/12/2014  . Elevated troponin   . Non-ischemic cardiomyopathy ([place])   . Hypoxemia   . Acute combined systolic and diastolic heart failure ([place])   . SVT (supraventricular tachycardia) ([place]) 12/09/2014  . NSTEMI (non-ST elevated myocardial infarction) ([place]) 12/09/2014  . Hypertension 12/09/2014    Past Surgical History:  Procedure Laterality Date  . CARDIAC CATHETERIZATION  12/11/2014   Procedure: RIGHT/LEFT HEART CATH AND CORONARY ANGIOGRAPHY;  Surgeon: Lorretta Harp, MD;  Location: [facility] CATH LAB;  Service: Cardiovascular;;  .  ICD LEAD REMOVAL N/A 03/30/2015   Procedure: ICD LEAD REMOVAL;  Surgeon: Marzetta Board, MD;  Location: [facility] ORS;  Service: Cardiovascular;  Laterality: N/A;  . IMPLANTABLE CARDIOVERTER DEFIBRILLATOR IMPLANT    . INSERT / REPLACE / REMOVE PACEMAKER    . LEFT HEART CATHETERIZATION WITH CORONARY ANGIOGRAM N/A 12/09/2014   Procedure: LEFT HEART CATHETERIZATION WITH CORONARY ANGIOGRAM;  Surgeon:  Burnell Blanks, MD;  Location: [facility] CATH LAB;  Service: Cardiovascular;  Laterality: N/A;    Prior to Admission medications   Medication Sig Start Date End Date Taking? Authorizing Provider  amiodarone (PACERONE) 200 MG tablet Take 200 mg by mouth 2 (two) times daily. 11/15/20 11/29/20  [provider]  atorvastatin (LIPITOR) 40 MG tablet Take 1 tablet (40 mg total) by mouth daily at 6 PM. Patient taking differently: Take 40 mg by mouth at bedtime. 11/04/18   Alisa Graff, FNP  carvedilol (COREG) 3.125 MG tablet Take 1 tablet (3.125 mg total) by mouth 2 (two) times daily with a meal. 11/25/20   Sharen Hones, MD  empagliflozin (JARDIANCE) 10 MG TABS tablet Take 10 mg by mouth daily.    [provider]  pantoprazole (PROTONIX) 40 MG tablet Take 1 tablet (40 mg total) by mouth daily. 11/25/20 12/25/20  Sharen Hones, MD  rivaroxaban (XARELTO) 20 MG TABS tablet Take 20 mg by mouth daily with supper.    [provider]  sacubitril-valsartan (ENTRESTO) 97-103 MG Take 1 tablet by mouth 2 (two) times daily. 08/13/20   Alisa Graff, FNP  tiotropium (SPIRIVA) 18 MCG inhalation capsule Place 18 mcg into inhaler and inhale daily.    [provider]  torsemide 40 MG TABS Take 40 mg by mouth 2 (two) times daily. 11/27/20   Lorella Nimrod, MD  umeclidinium-vilanterol (ANORO ELLIPTA) 62.5-25 MCG/INH AEPB Inhale 1 puff into the lungs daily in the afternoon. 01/31/19   [provider]  VENTOLIN HFA 108 (90 Base) MCG/ACT inhaler Inhale into the lungs. 11/23/20   [provider]    Allergies Bidil [isosorb dinitrate-hydralazine] and Ciprofloxacin  Family History  Problem Relation Age of Onset  . Hypertension Mother   . Congestive Heart Failure Mother   . Hypertension Sister   . Diabetes Sister   . Pancreatitis Sister   . COPD Sister   . Pancreatitis Brother   . Anemia Neg Hx   . Arrhythmia Neg Hx   . Asthma Neg Hx   . Clotting disorder Neg Hx   .  Fainting Neg Hx   . Heart attack Neg Hx   . Heart disease Neg Hx   . Heart failure Neg Hx   . Hyperlipidemia Neg Hx     Social History Social History   Tobacco Use  . Smoking status: Current Every Day Smoker    Packs/day: 0.50    Years: 24.00    Pack years: 12.00    Types: Cigarettes  . Smokeless tobacco: Never Used  . Tobacco comment: 4/5 Smoking 5 cigs a day  Vaping Use  . Vaping Use: Never used  Substance Use Topics  . Alcohol use: No  . Drug use: No    Review of Systems  Constitutional: No fever/chills Eyes: No visual changes. ENT: No sore throat. Respiratory: Denies cough Cardiovascular: Denies chest pain Gastrointestinal: Denies abdominal pain Genitourinary: Negative for dysuria. Musculoskeletal: Negative for back pain. Skin: Negative for rash.  Positive abscess Psychiatric: no mood changes,     ____________________________________________   PHYSICAL EXAM:  VITAL  SIGNS: ED Triage Vitals  Enc Vitals Group     BP 12/12/20 0900 107/78     Pulse Rate 12/12/20 0900 83     Resp 12/12/20 0900 19     Temp 12/12/20 0900 98.3 F (36.8 C)     Temp Source 12/12/20 0900 Oral     SpO2 12/12/20 0900 99 %     Weight 12/12/20 0901 294 lb (133.4 kg)     Height 12/12/20 0901 5' 6"  (1.676 m)     Head Circumference --      Peak Flow --      Pain Score 12/12/20 0901 5     Pain Loc --      Pain Edu? --      Excl. in [place]? --     Constitutional: Alert and oriented. Well appearing and in no acute distress. Eyes: Conjunctivae are normal.  Head: Atraumatic. Nose: No congestion/rhinnorhea. Mouth/Throat: Mucous membranes are moist.   Neck:  supple no lymphadenopathy noted Cardiovascular: Normal rate, regular rhythm. Heart sounds are normal Respiratory: Normal respiratory effort.  No retractions, lungs c t a  GU: Hardened area noted at the base of the scrotum in the perineum area, area is very tender, warm, questionable abscess versus mass Musculoskeletal: FROM all  extremities, warm and well perfused Neurologic:  Normal speech and language.  Skin:  Skin is warm, dry and intact. No rash noted. Psychiatric: Mood and affect are normal. Speech and behavior are normal.  ____________________________________________   LABS (all labs ordered are listed, but only abnormal results are displayed)  Labs Reviewed  COMPREHENSIVE METABOLIC PANEL - Abnormal; Notable for the following components:      Result Value   Sodium 133 (*)    Chloride 93 (*)    Glucose, Bld 229 (*)    BUN 54 (*)    Creatinine, Ser 1.84 (*)    Calcium 8.8 (*)    Total Protein 8.7 (*)    Albumin 3.4 (*)    AST 46 (*)    Alkaline Phosphatase 230 (*)    GFR, Estimated 46 (*)    All other components within normal limits  CBG MONITORING, ED - Abnormal; Notable for the following components:   Glucose-Capillary 221 (*)    All other components within normal limits  CULTURE, BLOOD (SINGLE)  CBC WITH DIFFERENTIAL/PLATELET  LACTIC ACID, PLASMA  LACTIC ACID, PLASMA   ____________________________________________   ____________________________________________  RADIOLOGY  CT of the pelvis with IV contrast  ____________________________________________   PROCEDURES  Procedure(s) performed: No  Procedures    ____________________________________________   INITIAL IMPRESSION / ASSESSMENT AND PLAN / ED COURSE  Pertinent labs & imaging results that were available during my care of the patient were reviewed by me and considered in my medical decision making (see chart for details).   The patient is a 45 year old male presents with an abscess to the perineum.  See HPI.  Physical exam shows patient to appear stable.   DDx: Scrotal abscess, abscess extending into the perineum, cellulitis, mass  CBG is elevated at 962 CBC, metabolic panel, lactic acid and blood culture were ordered CT of the pelvis with contrast  CBC is normal, basic metabolic panel low sodium, elevated glucose,  BUN and creatinine are elevated but for his normal trend, alk phos is elevated, lactic acid is normal,  CT of the pelvis with contrast showed a abscess running from the scrotum to the perineum but no Fournier's gangrene, this was reviewed by me and confirmed  by radiology.  Radiologist also suggest patient is high risk for gangrene due to his medical history.  Consult surgery, message sent to Dr. Dahlia Byes,  Otho Ket to see the patient  Discussed with surgery.  They will be taking him to the OR.  Will place admit orders.  Oscar [name] was evaluated in Emergency Department on 12/12/2020 for the symptoms described in the history of present illness. He was evaluated in the context of the global COVID-19 pandemic, which necessitated consideration that the patient might be at risk for infection with the SARS-CoV-2 virus that causes COVID-19. Institutional protocols and algorithms that pertain to the evaluation of patients at risk for COVID-19 are in a state of rapid change based on information released by regulatory bodies including the CDC and federal and state organizations. These policies and algorithms were followed during the patient's care in the ED.    As part of my medical decision making, I reviewed the following data within the [facility] notes reviewed and incorporated, Labs reviewed , Old chart reviewed, Radiograph reviewed , A consult was requested and obtained from this/these consultant(s) Surgery, Notes from prior ED visits and [place] Controlled Substance Database  ____________________________________________   FINAL CLINICAL IMPRESSION(S) / ED DIAGNOSES  Final diagnoses:  Perineal abscess      NEW MEDICATIONS STARTED DURING THIS VISIT:  New Prescriptions   No medications on file     Note:  This document was prepared using Dragon voice recognition software and may include unintentional dictation errors.    Versie Starks, PA-C 12/12/20 1230     Vanessa Upland, MD 12/12/20 [phone]

## 2020-12-12 NOTE — ED Triage Notes (Signed)
Pt comes into the ED via POV c/o abscess to the groin/perineum area.  Pt states the abscess has been there for 8-9 months and has progressively grown.  Pt does admit to being a diabetic and states he feels as though the abscess is infected even after it opened and drained.  Pt presents in NAD at this time with even and unlabored respirations and is ambulatory to triage.

## 2020-12-12 NOTE — Anesthesia Procedure Notes (Signed)
Procedure Name: LMA Insertion Performed by: Demetrius Charity, CRNA Pre-anesthesia Checklist: Patient identified, Patient being monitored, Timeout performed, Emergency Drugs available and Suction available Patient Re-evaluated:Patient Re-evaluated prior to induction Oxygen Delivery Method: Circle system utilized Preoxygenation: Pre-oxygenation with 100% oxygen Induction Type: IV induction Ventilation: Mask ventilation without difficulty LMA: LMA inserted LMA Size: 5.0 Tube type: Oral Number of attempts: 1 Placement Confirmation: positive ETCO2 and breath sounds checked- equal and bilateral Tube secured with: Tape Dental Injury: Teeth and Oropharynx as per pre-operative assessment

## 2020-12-12 NOTE — Transfer of Care (Signed)
Immediate Anesthesia Transfer of Care Note  Patient: Oscar [name]  Procedure(s) Performed: INCISION AND DRAINAGE ABSCESS (N/A )  Patient Location: PACU  Anesthesia Type:General  Level of Consciousness: awake and alert   Airway & Oxygen Therapy: Patient Spontanous Breathing and Patient connected to face mask oxygen  Post-op Assessment: Report given to RN and Post -op Vital signs reviewed and stable  Post vital signs: Reviewed and stable  Last Vitals:  Vitals Value Taken Time  BP 112/80 12/12/20 1600  Temp 36.9 C 12/12/20 1556  Pulse 90 12/12/20 1603  Resp 15 12/12/20 1603  SpO2 94 % 12/12/20 1603  Vitals shown include unvalidated device data.  Last Pain:  Vitals:   12/12/20 1424  TempSrc: Oral  PainSc: 4       Patients Stated Pain Goal: 0 (82/64/15 8309)  Complications: No complications documented.

## 2020-12-13 ENCOUNTER — Encounter: Payer: Self-pay | Admitting: Surgery

## 2020-12-13 DIAGNOSIS — K611 Rectal abscess: Secondary | ICD-10-CM | POA: Diagnosis not present

## 2020-12-13 LAB — BASIC METABOLIC PANEL
Anion gap: 13 (ref 5–15)
BUN: 59 mg/dL — ABNORMAL HIGH (ref 6–20)
CO2: 27 mmol/L (ref 22–32)
Calcium: 8.9 mg/dL (ref 8.9–10.3)
Chloride: 94 mmol/L — ABNORMAL LOW (ref 98–111)
Creatinine, Ser: 2.11 mg/dL — ABNORMAL HIGH (ref 0.61–1.24)
GFR, Estimated: 39 mL/min — ABNORMAL LOW (ref >60)
Glucose, Bld: 274 mg/dL — ABNORMAL HIGH (ref 70–99)
Potassium: 3.9 mmol/L (ref 3.5–5.1)
Sodium: 134 mmol/L — ABNORMAL LOW (ref 135–145)

## 2020-12-13 LAB — CBC
HCT: 40.5 % (ref 39.0–52.0)
Hemoglobin: 14.4 g/dL (ref 13.0–17.0)
MCH: 32.9 pg (ref 26.0–34.0)
MCHC: 35.6 g/dL (ref 30.0–36.0)
MCV: 92.5 fL (ref 80.0–100.0)
Platelets: 220 10*3/uL (ref 150–400)
RBC: 4.38 MIL/uL (ref 4.22–5.81)
RDW: 11.7 % (ref 11.5–15.5)
WBC: 6.7 10*3/uL (ref 4.0–10.5)
nRBC: 0 % (ref 0.0–0.2)

## 2020-12-13 LAB — GLUCOSE, CAPILLARY
Glucose-Capillary: 190 mg/dL — ABNORMAL HIGH (ref 70–99)
Glucose-Capillary: 260 mg/dL — ABNORMAL HIGH (ref 70–99)
Glucose-Capillary: 264 mg/dL — ABNORMAL HIGH (ref 70–99)
Glucose-Capillary: 310 mg/dL — ABNORMAL HIGH (ref 70–99)

## 2020-12-13 MED ORDER — TORSEMIDE 20 MG PO TABS
40.0000 mg | ORAL_TABLET | Freq: Two times a day (BID) | ORAL | Status: DC
  Administered 2020-12-13: 40 mg via ORAL
  Filled 2020-12-13 (×3): qty 2

## 2020-12-13 MED ORDER — CARVEDILOL 6.25 MG PO TABS
3.1250 mg | ORAL_TABLET | Freq: Two times a day (BID) | ORAL | Status: DC
  Filled 2020-12-13: qty 0.5

## 2020-12-13 MED ORDER — AMOXICILLIN-POT CLAVULANATE 875-125 MG PO TABS: 1.0000 | ORAL_TABLET | Freq: Two times a day (BID) | ORAL | 0 refills | Status: AC

## 2020-12-13 MED ORDER — TORSEMIDE 20 MG PO TABS: 40.0000 mg | ORAL_TABLET | Freq: Every day | ORAL | Status: DC

## 2020-12-13 MED ORDER — SACUBITRIL-VALSARTAN 49-51 MG PO TABS
1.0000 | ORAL_TABLET | Freq: Two times a day (BID) | ORAL | Status: DC
  Filled 2020-12-13 (×3): qty 1

## 2020-12-13 MED ORDER — OXYCODONE HCL 5 MG PO TABS: 5.0000 mg | ORAL_TABLET | Freq: Four times a day (QID) | ORAL | 0 refills | Status: DC | PRN

## 2020-12-13 NOTE — Discharge Instructions (Signed)
In addition to included general post-operative instructions,  Wound care: Pack left perineal/scrotal wound with 1/2 inch iodoform gauze daily, cover with dry 4x4 gauze, secure with tape or mesh underwear. Okay to remove this to shower, pat dry, and re-pack as outlined above.   Medications: Resume all home medications. Follow up with PCP in 1 week. Complete ABx as prescribed.   Call office [phone] / [phone]) at any time if any questions, worsening pain, fevers/chills, bleeding, drainage from incision site, or other concerns.

## 2020-12-13 NOTE — TOC Initial Note (Signed)
Transition of Care [facility]) - Initial/Assessment Note    Patient Details  Name: Oscar [name] MRN: [id number] Date of Birth: 03-28-1976  Transition of [facility]) CM/SW Contact:    Beverly Sessions, RN Phone Number: 12/13/2020, 5:03 PM  Clinical Narrative:                 Patient was discharged today with resumption home health orders with [facility].  Corene Cornea with [facility] notified of discharge         Patient Goals and CMS Choice        Expected Discharge Plan and Services           Expected Discharge Date: 12/13/20                                    Prior Living Arrangements/Services                       Activities of Daily Living Home Assistive Devices/Equipment: Kasandra Knudsen (specify quad or straight),BIPAP,CBG Meter,Dentures (specify type),Grab bars in shower,Grab bars around toilet,Hand-held shower hose,Oxygen,Other (Comment) (dentures(upper), ramp) ADL Screening (condition at time of admission) Patient's cognitive ability adequate to safely complete daily activities?: Yes Is the patient deaf or have difficulty hearing?: No Does the patient have difficulty seeing, even when wearing glasses/contacts?: Yes Does the patient have difficulty concentrating, remembering, or making decisions?: Yes Patient able to express need for assistance with ADLs?: Yes Does the patient have difficulty dressing or bathing?: No Independently performs ADLs?: Yes (appropriate for developmental age) Does the patient have difficulty walking or climbing stairs?: Yes Weakness of Legs: Both Weakness of Arms/Hands: None  Permission Sought/Granted                  Emotional Assessment              Admission diagnosis:  Perineal abscess [L02.215] Patient Active Problem List   Diagnosis Date Noted  . Perineal abscess 12/12/2020  . Flash pulmonary edema ([place])   . Pulmonary edema 11/25/2020  . Obesity, Class III, BMI 40-49.9 (morbid obesity)  ([place]) 11/24/2020  . Acute on chronic diastolic (congestive) heart failure ([place]) 11/24/2020  . Acute exacerbation of CHF (congestive heart failure) ([place]) 11/23/2020  . Hepatic cirrhosis, unspecified hepatic cirrhosis type, unspecified whether ascites present ([place]) 11/23/2020  . Type 2 diabetes mellitus with other diabetic neurological complication ([place]) 24/26/8341  . Obesity hypoventilation syndrome ([place]) 11/23/2020  . Atrial fibrillation with RVR ([place]) 11/23/2020  . ARF (acute renal failure) ([place]) 03/28/2020  . Acute on chronic combined systolic (congestive) and diastolic (congestive) heart failure ([place]) 02/27/2020  . COPD (chronic obstructive pulmonary disease) ([place]) 02/27/2020  . Sprain of foot 01/26/2020  . Foreign body granuloma of skin and subcutaneous tissue 05/26/2019  . Acute pancreatitis 05/19/2017  . Pancreatitis 09/12/2016  . Bronchitis 05/20/2016  . Constipation 11/08/2015  . Abdominal pain 11/02/2015  . Obstructive sleep apnea 07/31/2015  . Tobacco use 07/31/2015  . Diabetes ([place]) 07/10/2015  . Chronic systolic heart failure ([place]) 03/30/2015  . Cardiac pacemaker 03/30/2015  . Chronic respiratory failure with hypercapnia ([place]) 12/12/2014  . Elevated troponin   . Non-ischemic cardiomyopathy ([place])   . Hypoxemia   . Acute combined systolic and diastolic heart failure ([place])   . SVT (supraventricular tachycardia) ([place]) 12/09/2014  . NSTEMI (non-ST elevated myocardial infarction) ([place]) 12/09/2014  . Hypertension 12/09/2014  PCP:  Center, [place]:   [facility] [address] [place] [address] [place]) [place] [address] Phone: [phone] Fax: [phone]     Social Determinants of Health (SDOH) Interventions    Readmission Risk Interventions No flowsheet data found.

## 2020-12-13 NOTE — Care Management Obs Status (Signed)
[place] NOTIFICATION   Patient Details  Name: COMER DEVINS MRN: [id number] Date of Birth: Jan 24, 1976   Medicare Observation Status Notification Given:  No (Admitted obs less than 24 hours)    Beverly Sessions, RN 12/13/2020, 10:17 AM

## 2020-12-13 NOTE — Discharge Summary (Signed)
[facility] SURGICAL DISCHARGE SUMMARY   Patient ID: Oscar [name] MRN: [id number] DOB/AGE: 1976-08-29 45 y.o.  Admit date: 12/12/2020 Discharge date: 12/13/2020  Discharge Diagnoses Patient Active Problem List   Diagnosis Date Noted  . Perineal abscess 12/12/2020    Consultants Medicine  Procedures 12/12/2020:  1. Incision and drainage of complex perirectal abscess 2. Excisional debridement of skin subcutaneous tissue and muscle measuring 9 square centimeters ( 3x3 cms)  HPI: 45 y.o. male presented to [facility] ED today for abscess. Patient reports he believe he has had a small perineal abscess for about 8-9 months now however it has typically been very small and would spontaneously drain, which provided him intermittent relief from this. Unfortunately, in the last few days, he has had significant worsening of this. He denied any fever, chills at home. He does have diabetes but this is relatively controlled at home. He does have a history of similar abscess around 10 years ago which needed drained. Of note, did have a recent admission in March for what appears to be CHF exacerbation. He has not taken his Xarelto in 10 days. Work up in the ED revealed relatively reassuring labs aside from baseline renal disease and hyperglycemia consistent with DM. However, CT Pelvis was concerning for perineal abscess.   Hospital Course: Informed consent was obtained and documented, and patient underwent uneventful I&D of left perineal abscess (Dr Dahlia Byes, 12/12/2020).  Post-operatively, patient did well. Medicine service was consulted for assistance with comorbid conditions. Advancement of patient's diet and ambulation were well-tolerated. The remainder of patient's hospital course was essentially unremarkable, and discharge planning was initiated accordingly with patient safely able to be discharged home with appropriate discharge instructions, antibiotics (augmentin x10 days, will follow up  Cx from OR), pain control, and outpatient follow-up after all of his questions were answered to his expressed satisfaction.   Discharge Condition: Good   Physical Examination:  Constitutional: Well appearing male, NAD Pulmonary: Normal effort, no respiratory distress Skin: left perineal/scrotal abscess site appears significantly improved, less induration, no longer with tenderness, no appreciable drainage this morning   Wound Care:  Pack left perineal/scrotal wound with 1/2 inch iodoform gauze daily, cover with dry 4x4 gauze, secure with tape or mesh underwear. Okay to remove this to shower, pat dry, and re-pack as outlined above.     Allergies as of 12/13/2020      Reactions   Ciprofloxacin Other (See Comments), Swelling   Migraine Headache Migraine Headache   Isosorb Dinitrate-hydralazine Other (See Comments)   Migraine Headache Migraine Headache Other reaction(s): Other (See Comments) Migraine Headache Other reaction(s): Other (See Comments) Migraine Headache Migraine Headache Migraine Headache Migraine Headache Migraine Headache Migraine Headache      Medication List    TAKE these medications   amiodarone 200 MG tablet Commonly known as: PACERONE Take 200 mg by mouth 2 (two) times daily.   amoxicillin-clavulanate 875-125 MG tablet Commonly known as: Augmentin Take 1 tablet by mouth 2 (two) times daily for 10 days.   Anoro Ellipta 62.5-25 MCG/INH Aepb Generic drug: umeclidinium-vilanterol Inhale 1 puff into the lungs daily in the afternoon.   atorvastatin 40 MG tablet Commonly known as: LIPITOR Take 1 tablet (40 mg total) by mouth daily at 6 PM. What changed: when to take this   carvedilol 3.125 MG tablet Commonly known as: COREG Take 1 tablet (3.125 mg total) by mouth 2 (two) times daily with a meal.   empagliflozin 10 MG Tabs tablet Commonly known as: JARDIANCE Take  10 mg by mouth daily.   oxyCODONE 5 MG immediate release tablet Commonly known as:  Oxy IR/ROXICODONE Take 1 tablet (5 mg total) by mouth every 6 (six) hours as needed for severe pain or breakthrough pain (use 30 minutes prior to planned dressing changes).   pantoprazole 40 MG tablet Commonly known as: Protonix Take 1 tablet (40 mg total) by mouth daily.   rivaroxaban 20 MG Tabs tablet Commonly known as: XARELTO Take 20 mg by mouth daily with supper.   Entresto 49-51 MG Generic drug: sacubitril-valsartan Take 1 tablet by mouth 2 (two) times daily.   sacubitril-valsartan 97-103 MG Commonly known as: ENTRESTO Take 1 tablet by mouth 2 (two) times daily.   tiotropium 18 MCG inhalation capsule Commonly known as: SPIRIVA Place 18 mcg into inhaler and inhale daily.   Torsemide 40 MG Tabs Take 40 mg by mouth 2 (two) times daily. What changed: when to take this   Ventolin HFA 108 (90 Base) MCG/ACT inhaler Generic drug: albuterol Inhale into the lungs.            Discharge Care Instructions  (From admission, onward)         Start     Ordered   12/13/20 0000  Discharge wound care:       Comments: Pack left perineal/scrotal wound with 1/2 inch iodoform gauze daily, cover with dry 4x4 gauze, secure with tape or mesh underwear. Okay to remove this to shower, pat dry, and re-pack as outlined above.   12/13/20 [place], [facility]. Schedule an appointment as soon as possible for a visit in 1 week(s).   Why: follow up hospitalization for perineal abscess Contact information: [address] [place] [address] [phone]        Tylene Fantasia, PA-C. Schedule an appointment as soon as possible for a visit in 2 week(s).   Specialty: Physician Assistant Why: s/p perineal abscess I&D Contact information: [address] [place] [address] [phone]                Time spent on discharge management including discussion of hospital course, clinical condition, outpatient  instructions, prescriptions, and follow up with the patient and members of the medical team: >30 minutes  -- Edison Simon , PA-C [facility]  12/13/2020, 9:23 AM [phone] M-F: 7am - 4pm

## 2020-12-14 LAB — SURGICAL PATHOLOGY

## 2020-12-17 ENCOUNTER — Telehealth: Payer: Self-pay | Admitting: Family

## 2020-12-17 ENCOUNTER — Ambulatory Visit: Payer: Medicare Other | Admitting: Family

## 2020-12-17 LAB — AEROBIC/ANAEROBIC CULTURE W GRAM STAIN (SURGICAL/DEEP WOUND)

## 2020-12-17 LAB — CULTURE, BLOOD (SINGLE)
Culture: NO GROWTH
Special Requests: ADEQUATE

## 2020-12-17 NOTE — Progress Notes (Incomplete)
Patient ID: JUANA MONTINI, male    DOB: [birth year], 45 y.o.   MRN: [id number]  HPI  Mr Kunath is a 45 y/o male with a history of obstructive sleep apnea (currently with CPAP), MI, HTN, hyperlipidemia, GERD, DM, SVT, asthma and chronic heart failure.  Echo report from 11/23/20 reviewed and showed an EF of <20% along with moderately elevated PA pressure, moderate LAE, mild MR and moderate/ severe TR. Echo done 02/27/20 reviewed and showed an EF of <20%. Echo done 03/01/2019 which showed an EF of 15% along with mild MR. Echo done 06/23/18 reviewed and showed an EF of 20-25% along with mild MR. Echo done 02/06/15 which showed an EF of 25% which is unchanged from previous echo on 12/10/14 which showed an EF of 25% and mild MR.   Has not been admitted or been in the ED in the last 6 months.    He presents today for a follow-up visit with a chief complaint of moderate fatigue with very little exertion. He describes this as chronic in nature having been present for several years although has worsened over the last 2 days. He has associated cough, shortness of breath, intermittent chest pain, palpitations, light-headedness, slight weight gain and chronic pain along with this. He denies any abdominal distention, pedal edema or wheezing.   He says that he hasn't taken his medications yet today but overall hasn't felt well over the last couple of days. Says that he hasn't been drinking much fluids lately.   Past Medical History:  Diagnosis Date  . AICD (automatic cardioverter/defibrillator) present   . Asthma   . Cardiomyopathy ([place])   . CHF (congestive heart failure) ([place])   . Coronary artery disease   . Deafness in right ear   . Diabetes mellitus without complication ([place])   . Dilated cardiomyopathy ([place])   . Dysrhythmia    svt  . 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 GERD (gastroesophageal reflux disease)   . Hyperlipidemia   . Hypertension   . Hypoxemia   . Hypoxemia   . Mild obesity    . Myocardial infarction ([place])    1829,9371,6/96  . Pancreatitis   . Sleep apnea    osa   Past Surgical History:  Procedure Laterality Date  . CARDIAC CATHETERIZATION  12/11/2014   Procedure: RIGHT/LEFT HEART CATH AND CORONARY ANGIOGRAPHY;  Surgeon: Lorretta Harp, MD;  Location: [facility] CATH LAB;  Service: Cardiovascular;;  . ICD LEAD REMOVAL N/A 03/30/2015   Procedure: ICD LEAD REMOVAL;  Surgeon: Marzetta Board, MD;  Location: [facility] ORS;  Service: Cardiovascular;  Laterality: N/A;  . IMPLANTABLE CARDIOVERTER DEFIBRILLATOR IMPLANT    . INCISION AND DRAINAGE ABSCESS N/A 12/12/2020   Procedure: INCISION AND DRAINAGE ABSCESS;  Surgeon: Jules Husbands, MD;  Location: [facility] ORS;  Service: General;  Laterality: N/A;  . INSERT / REPLACE / REMOVE PACEMAKER    . LEFT HEART CATHETERIZATION WITH CORONARY ANGIOGRAM N/A 12/09/2014   Procedure: LEFT HEART CATHETERIZATION WITH CORONARY ANGIOGRAM;  Surgeon: Burnell Blanks, MD;  Location: [facility] CATH LAB;  Service: Cardiovascular;  Laterality: N/A;   Family History  Problem Relation Age of Onset  . Hypertension Mother   . Congestive Heart Failure Mother   . Hypertension Sister   . Diabetes Sister   . Pancreatitis Sister   . COPD Sister   . Pancreatitis Brother   . Anemia Neg Hx   . Arrhythmia Neg Hx   . Asthma Neg  Hx   . Clotting disorder Neg Hx   . Fainting Neg Hx   . Heart attack Neg Hx   . Heart disease Neg Hx   . Heart failure Neg Hx   . Hyperlipidemia Neg Hx    Social History   Tobacco Use  . Smoking status: Current Every Day Smoker    Packs/day: 0.50    Years: 24.00    Pack years: 12.00    Types: Cigarettes  . Smokeless tobacco: Never Used  . Tobacco comment: 4/5 Smoking 5 cigs a day  Substance Use Topics  . Alcohol use: No   Allergies  Allergen Reactions  . Ciprofloxacin Other (See Comments) and Swelling    Migraine Headache Migraine Headache  . Isosorb Dinitrate-Hydralazine Other (See Comments)    Migraine  Headache Migraine Headache Other reaction(s): Other (See Comments) Migraine Headache Other reaction(s): Other (See Comments) Migraine Headache Migraine Headache Migraine Headache Migraine Headache Migraine Headache Migraine Headache     Review of Systems  Constitutional: Positive for fatigue (worsening). Negative for appetite change.  HENT: Negative for congestion, postnasal drip and sore throat.   Eyes: Negative.   Respiratory: Positive for cough and shortness of breath. Negative for chest tightness and wheezing.   Cardiovascular: Positive for chest pain and palpitations. Negative for leg swelling.  Gastrointestinal: Positive for abdominal pain (at times). Negative for abdominal distention, diarrhea and nausea.  Endocrine: Negative.   Genitourinary: Negative.   Musculoskeletal: Positive for back pain. Negative for arthralgias.  Skin: Negative.   Allergic/Immunologic: Negative.   Neurological: Positive for light-headedness. Negative for dizziness and headaches.  Hematological: Negative for adenopathy. Does not bruise/bleed easily.  Psychiatric/Behavioral: Negative for dysphoric mood, sleep disturbance (sleeping on 4 pillows) and suicidal ideas. The patient is not nervous/anxious.      Physical Exam Vitals and nursing note reviewed.  Constitutional:      Appearance: He is well-developed.  HENT:     Head: Normocephalic and atraumatic.  Neck:     Vascular: No JVD.  Cardiovascular:     Rate and Rhythm: Tachycardia present. Rhythm irregular.     Comments: HR ranges from 70-133's Pulmonary:     Effort: Pulmonary effort is normal.     Breath sounds: No wheezing or rales.  Abdominal:     General: There is no distension.     Palpations: Abdomen is soft.     Tenderness: There is no abdominal tenderness.  Musculoskeletal:        General: No tenderness.     Cervical back: Normal range of motion and neck supple.  Skin:    General: Skin is warm and dry.  Neurological:      Mental Status: He is alert and oriented to person, place, and time.  Psychiatric:        Behavior: Behavior normal.        Thought Content: Thought content normal.      Assessment & Plan:  1: Chronic heart failure with reduced ejection fraction- - NYHA class III - euvolemic today - weighing daily; Reminded to call for an overnight weight gain of >2 pounds or a weekly weight gain of >5 pounds.  - weight up 6 pounds from last visit here 3 months ago - last saw cardiology [name]) 06/11/20 - bidil gave him migraine headache - advised to drink 48 ounces of water daily - BNP on 03/28/20 was 264.6 - AICD in place - has received both COVID vaccines  2: HTN- - BP quite low (90/60); advised  to hold entresto today and beginning tomorrow, he will take 1/2 tablet BID which will be equivalent to the 49/51mg  dose - sees PCP [name]) at [place] from 09/12/20 reviewed and shows sodium 137, potassium 4.0, creatinine 1.2 & GFR 79  3: Obstructive sleep apnea- - not wearing CPAP anymore due to comfort level but would like to resume as he thinks he felt better when he wore it - continues wearing oxygen at 3L at bedtime & PRN during the day  4: Diabetes- - fasting glucose at home was 161 - continues off his insulin - A1c from 02/27/20 was 8.5%  5: Tobacco use- - continues to smoke 1/2 ppd cigarettes daily - does remove himself from the oxygen when smoking - complete cessation discussed for 3 minutes with him  6: Chest pain/ AF- - HR very irregular today and ranged from 70's-130's - history of PAF - EKG done in the office showed AF with HR of 133 with LBBB (which has been present before) - says that he's taking metoprolol succinate 75mg  daily - called his cardiology office and appointment was made with Gladstone Pih, PA for tomorrow morning at 8:30am - photocopy of today's EKG given to patient to take with him tomorrow; explained that they will probably  repeat it tomorrrow - may need holter monitor and/ or anticoagulant - emphasized that he needed to take all his medication bottles with him tomorrow   Patient did not bring his medications nor a list. Emphasized that he needed to bring his medication bottles to every visit with him.   Return in 1 month or sooner for any questions/problems before then.

## 2020-12-17 NOTE — Telephone Encounter (Signed)
Patient did not show for his [facility] appointment on 12/17/20. Will attempt to reschedule.

## 2020-12-18 NOTE — Anesthesia Postprocedure Evaluation (Signed)
Anesthesia Post Note  Patient: Oscar [name]  Procedure(s) Performed: INCISION AND DRAINAGE ABSCESS (N/A Perineum)  Patient location during evaluation: PACU Anesthesia Type: General Level of consciousness: awake and alert Pain management: pain level controlled Vital Signs Assessment: post-procedure vital signs reviewed and stable Respiratory status: spontaneous breathing, nonlabored ventilation, respiratory function stable and patient connected to nasal cannula oxygen Cardiovascular status: blood pressure returned to baseline and stable Postop Assessment: no apparent nausea or vomiting Anesthetic complications: no   No complications documented.   Last Vitals:  Vitals:   12/13/20 1007 12/13/20 1207  BP:  100/70  Pulse: (!) 58 (!) 58  Resp:  14  Temp:  (!) 36.3 C  SpO2:  99%    Last Pain:  Vitals:   12/13/20 1207  TempSrc: Oral  PainSc:                  [name]

## 2020-12-25 ENCOUNTER — Telehealth: Payer: Self-pay | Admitting: Family

## 2020-12-25 ENCOUNTER — Other Ambulatory Visit: Payer: Self-pay | Admitting: Family

## 2020-12-25 DIAGNOSIS — I5023 Acute on chronic systolic (congestive) heart failure: Secondary | ICD-10-CM

## 2020-12-25 NOTE — Telephone Encounter (Signed)
LVM with Zahi after receiving a message from him that he has had a 5lb weight gain. Patient asked if he could increase his lasix and I advised him per Darylene Price, FNP that he can increase his torsemide to BID but needs to schedule an appointment and keep it.   Oscar [name], NT

## 2020-12-26 ENCOUNTER — Encounter: Payer: Self-pay | Admitting: Family

## 2020-12-26 ENCOUNTER — Other Ambulatory Visit: Payer: Self-pay

## 2020-12-26 ENCOUNTER — Ambulatory Visit
Admission: RE | Admit: 2020-12-26 | Discharge: 2020-12-26 | Disposition: A | Discharge status: Home / Self Care | Payer: Medicare Other | Source: Ambulatory Visit | Attending: Family | Admitting: Family

## 2020-12-26 ENCOUNTER — Ambulatory Visit (HOSPITAL_BASED_OUTPATIENT_CLINIC_OR_DEPARTMENT_OTHER): Payer: Medicare Other | Admitting: Family

## 2020-12-26 VITALS — BP 120/69 | HR 80 | Resp 20 | Ht 66.0 in | Wt 299.0 lb

## 2020-12-26 DIAGNOSIS — I5023 Acute on chronic systolic (congestive) heart failure: Secondary | ICD-10-CM | POA: Insufficient documentation

## 2020-12-26 DIAGNOSIS — G43909 Migraine, unspecified, not intractable, without status migrainosus: Secondary | ICD-10-CM | POA: Insufficient documentation

## 2020-12-26 DIAGNOSIS — I480 Paroxysmal atrial fibrillation: Secondary | ICD-10-CM

## 2020-12-26 DIAGNOSIS — Z720 Tobacco use: Secondary | ICD-10-CM

## 2020-12-26 DIAGNOSIS — G4733 Obstructive sleep apnea (adult) (pediatric): Secondary | ICD-10-CM

## 2020-12-26 DIAGNOSIS — Z794 Long term (current) use of insulin: Secondary | ICD-10-CM | POA: Insufficient documentation

## 2020-12-26 DIAGNOSIS — I5022 Chronic systolic (congestive) heart failure: Secondary | ICD-10-CM

## 2020-12-26 DIAGNOSIS — Z79899 Other long term (current) drug therapy: Secondary | ICD-10-CM | POA: Insufficient documentation

## 2020-12-26 DIAGNOSIS — E119 Type 2 diabetes mellitus without complications: Secondary | ICD-10-CM | POA: Insufficient documentation

## 2020-12-26 DIAGNOSIS — F17210 Nicotine dependence, cigarettes, uncomplicated: Secondary | ICD-10-CM | POA: Insufficient documentation

## 2020-12-26 DIAGNOSIS — I252 Old myocardial infarction: Secondary | ICD-10-CM | POA: Diagnosis not present

## 2020-12-26 DIAGNOSIS — Z95810 Presence of automatic (implantable) cardiac defibrillator: Secondary | ICD-10-CM | POA: Diagnosis not present

## 2020-12-26 DIAGNOSIS — E785 Hyperlipidemia, unspecified: Secondary | ICD-10-CM | POA: Insufficient documentation

## 2020-12-26 DIAGNOSIS — I4891 Unspecified atrial fibrillation: Secondary | ICD-10-CM | POA: Diagnosis not present

## 2020-12-26 DIAGNOSIS — Z8249 Family history of ischemic heart disease and other diseases of the circulatory system: Secondary | ICD-10-CM | POA: Diagnosis not present

## 2020-12-26 DIAGNOSIS — I110 Hypertensive heart disease with heart failure: Secondary | ICD-10-CM | POA: Insufficient documentation

## 2020-12-26 DIAGNOSIS — I10 Essential (primary) hypertension: Secondary | ICD-10-CM

## 2020-12-26 LAB — BASIC METABOLIC PANEL WITH GFR
Anion gap: 11 (ref 5–15)
BUN: 48 mg/dL — ABNORMAL HIGH (ref 6–20)
CO2: 26 mmol/L (ref 22–32)
Calcium: 9.2 mg/dL (ref 8.9–10.3)
Chloride: 98 mmol/L (ref 98–111)
Creatinine, Ser: 1.67 mg/dL — ABNORMAL HIGH (ref 0.61–1.24)
GFR, Estimated: 51 mL/min — ABNORMAL LOW
Glucose, Bld: 231 mg/dL — ABNORMAL HIGH (ref 70–99)
Potassium: 4 mmol/L (ref 3.5–5.1)
Sodium: 135 mmol/L (ref 135–145)

## 2020-12-26 LAB — BRAIN NATRIURETIC PEPTIDE: B Natriuretic Peptide: 136.9 pg/mL — ABNORMAL HIGH (ref 0.0–100.0)

## 2020-12-26 MED ORDER — FUROSEMIDE 10 MG/ML IJ SOLN
80.0000 mg | Freq: Once | INTRAMUSCULAR | Status: AC
Start: 2020-12-26 — End: 2020-12-26

## 2020-12-26 MED ORDER — FUROSEMIDE 10 MG/ML IJ SOLN
INTRAMUSCULAR | Status: AC
  Administered 2020-12-26: 80 mg via INTRAVENOUS
  Filled 2020-12-26: qty 8

## 2020-12-26 MED ORDER — SODIUM CHLORIDE FLUSH 0.9 % IV SOLN
INTRAVENOUS | Status: AC
  Filled 2020-12-26: qty 10

## 2020-12-26 MED ORDER — POTASSIUM CHLORIDE CRYS ER 20 MEQ PO TBCR: 40.0000 meq | EXTENDED_RELEASE_TABLET | Freq: Once | ORAL | Status: AC

## 2020-12-26 MED ORDER — POTASSIUM CHLORIDE CRYS ER 20 MEQ PO TBCR
EXTENDED_RELEASE_TABLET | ORAL | Status: AC
  Administered 2020-12-26: 40 meq via ORAL
  Filled 2020-12-26: qty 2

## 2020-12-26 NOTE — Progress Notes (Signed)
Patient ID: Oscar [name], male    DOB: 1975/12/20, 45 y.o.   MRN: [id number]  HPI  Oscar [name] is a 45 y/o male with a history of obstructive sleep apnea (currently with CPAP), MI, HTN, hyperlipidemia, GERD, DM, SVT, asthma and chronic heart failure.  Echo report from 11/23/20 reviewed and showed an EF of <20% along with moderate LVH and moderately elevated PA pressure. Echo done 02/27/20 reviewed and showed an EF of <20%. Echo done 03/01/2019 which showed an EF of 15% along with mild Oscar. Echo done 06/23/18 reviewed and showed an EF of 20-25% along with mild Oscar. Echo done 02/06/15 which showed an EF of 25% which is unchanged from previous echo on 12/10/14 which showed an EF of 25% and mild Oscar.   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 I & D and debridement completed. Placed on antibiotics. Discharged the following day.   Oscar [name] presents today for a follow-up visit with a chief complaint of moderate fatigue with little exertion. Oscar [name] describes this as chronic in nature having been present for several years although Oscar [name] feels like it has worsened recently. Oscar [name] has associated cough, shortness of breath, light-headedness, abdominal pain and weight gain along with this. Oscar [name] denies any difficulty sleeping, abdominal distention, palpitations, pedal edema, chest pain, dizziness or wheezing.   Oscar [name] says that Oscar [name]'s only been taking Oscar [name] entresto as 1 tablet daily because Oscar [name] BP drops when Oscar [name] takes it BID. Says that Oscar [name] home BP has gotten as low as 80/60. Says that Oscar [name]'s gained 5 pounds over the last few days.   Past Medical History:  Diagnosis Date  . AICD (automatic cardioverter/defibrillator) present   . Asthma   . Cardiomyopathy ([place])   . CHF (congestive heart failure) ([place])   . Coronary artery disease   . Deafness in right ear   . Diabetes mellitus without complication ([place])   . Dilated cardiomyopathy ([place])   . Dysrhythmia    svt  . 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 GERD (gastroesophageal reflux  disease)   . Hyperlipidemia   . Hypertension   . Hypoxemia   . Hypoxemia   . Mild obesity   . Myocardial infarction ([employer])    [id number]  . Pancreatitis   . Sleep apnea    osa   Past Surgical History:  Procedure Laterality Date  . CARDIAC CATHETERIZATION  12/11/2014   Procedure: RIGHT/LEFT HEART CATH AND CORONARY ANGIOGRAPHY;  Surgeon: Lorretta Harp, MD;  Location: [facility] CATH LAB;  Service: Cardiovascular;;  . ICD LEAD REMOVAL N/A 03/30/2015   Procedure: ICD LEAD REMOVAL;  Surgeon: Marzetta Board, MD;  Location: [facility] ORS;  Service: Cardiovascular;  Laterality: N/A;  . IMPLANTABLE CARDIOVERTER DEFIBRILLATOR IMPLANT    . INCISION AND DRAINAGE ABSCESS N/A 12/12/2020   Procedure: INCISION AND DRAINAGE ABSCESS;  Surgeon: Jules Husbands, MD;  Location: [facility] ORS;  Service: General;  Laterality: N/A;  . INSERT / REPLACE / REMOVE PACEMAKER    . LEFT HEART CATHETERIZATION WITH CORONARY ANGIOGRAM N/A 12/09/2014   Procedure: LEFT HEART CATHETERIZATION WITH CORONARY ANGIOGRAM;  Surgeon: Burnell Blanks, MD;  Location: [facility] CATH LAB;  Service: Cardiovascular;  Laterality: N/A;   Family History  Problem Relation Age of Onset  . Hypertension Mother   . Congestive Heart Failure Mother   . Hypertension Sister   . Diabetes Sister   . Pancreatitis Sister   . COPD Sister   . Pancreatitis Brother   . Anemia  Neg Hx   . Arrhythmia Neg Hx   . Asthma Neg Hx   . Clotting disorder Neg Hx   . Fainting Neg Hx   . Heart attack Neg Hx   . Heart disease Neg Hx   . Heart failure Neg Hx   . Hyperlipidemia Neg Hx    Social History   Tobacco Use  . Smoking status: Current Every Day Smoker    Packs/day: 0.50    Years: 24.00    Pack years: 12.00    Types: Cigarettes  . Smokeless tobacco: Never Used  . Tobacco comment: 4/5 Smoking 5 cigs a day  Substance Use Topics  . Alcohol use: No   Allergies  Allergen Reactions  . Ciprofloxacin Other (See Comments) and Swelling    Migraine  Headache Migraine Headache  . Isosorb Dinitrate-Hydralazine Other (See Comments)    Migraine Headache Migraine Headache Other reaction(s): Other (See Comments) Migraine Headache Other reaction(s): Other (See Comments) Migraine Headache Migraine Headache Migraine Headache Migraine Headache Migraine Headache Migraine Headache   Prior to Admission medications   Medication Sig Start Date End Date Taking? Authorizing Provider  carvedilol (COREG) 3.125 MG tablet Take 1 tablet (3.125 mg total) by mouth 2 (two) times daily with a meal. 11/25/20  Yes Sharen Hones, MD  empagliflozin (JARDIANCE) 10 MG TABS tablet Take 10 mg by mouth daily.   Yes [provider]  insulin detemir (LEVEMIR) 100 UNIT/ML injection Inject 15 Units into the skin 2 (two) times daily.   Yes [provider]  oxyCODONE (OXY IR/ROXICODONE) 5 MG immediate release tablet Take 1 tablet (5 mg total) by mouth every 6 (six) hours as needed for severe pain or breakthrough pain (use 30 minutes prior to planned dressing changes). 12/13/20  Yes Tylene Fantasia, PA-C  pantoprazole (PROTONIX) 40 MG tablet Take 1 tablet (40 mg total) by mouth daily. 11/25/20 12/25/20 Yes Sharen Hones, MD  sacubitril-valsartan (ENTRESTO) 49-51 MG Take 1 tablet by mouth 2 (two) times daily.   Yes [provider]  tiotropium (SPIRIVA) 18 MCG inhalation capsule Place 18 mcg into inhaler and inhale daily.   Yes [provider]  torsemide 40 MG TABS Take 40 mg by mouth 2 (two) times daily. Patient taking differently: Take 40 mg by mouth in the morning and at bedtime. 11/27/20  Yes Lorella Nimrod, MD  umeclidinium-vilanterol (ANORO ELLIPTA) 62.5-25 MCG/INH AEPB Inhale 1 puff into the lungs daily in the afternoon. 01/31/19  Yes [provider]  VENTOLIN HFA 108 (90 Base) MCG/ACT inhaler Inhale into the lungs. 11/23/20  Yes [provider]  amiodarone (PACERONE) 200 MG tablet Take 200 mg by mouth 2 (two) times  daily. Patient not taking: No sig reported 11/15/20 11/29/20  [provider]  atorvastatin (LIPITOR) 40 MG tablet Take 1 tablet (40 mg total) by mouth daily at 6 PM. Patient not taking: Reported on 12/26/2020 11/04/18   Alisa Graff, FNP  rivaroxaban (XARELTO) 20 MG TABS tablet Take 20 mg by mouth daily with supper. Patient not taking: No sig reported    [provider]  sacubitril-valsartan (ENTRESTO) 97-103 MG Take 1 tablet by mouth 2 (two) times daily. Patient taking once a day 08/13/20   Alisa Graff, FNP    Review of Systems  Constitutional: Positive for fatigue. Negative for appetite change.  HENT: Negative for congestion, postnasal drip and sore throat.   Eyes: Negative.   Respiratory: Positive for cough and shortness of breath. Negative for chest tightness and  wheezing.   Cardiovascular: Negative for chest pain, palpitations and leg swelling.  Gastrointestinal: Positive for abdominal pain (at times). Negative for abdominal distention, diarrhea and nausea.  Endocrine: Negative.   Genitourinary: Negative.   Musculoskeletal: Positive for back pain. Negative for arthralgias.  Skin: Negative.   Allergic/Immunologic: Negative.   Neurological: Positive for light-headedness. Negative for dizziness and headaches.  Hematological: Negative for adenopathy. Does not bruise/bleed easily.  Psychiatric/Behavioral: Negative for dysphoric mood and sleep disturbance (sleeping on 3 pillows with CPAP/ oxygen). The patient is not nervous/anxious.    Vitals:   12/26/20 1007  BP: 120/69  Pulse: 80  Resp: 20  SpO2: 99%  Weight: 299 lb (135.6 kg)  Height: 5\' 6"  (1.676 m)   Wt Readings from Last 3 Encounters:  12/26/20 299 lb (135.6 kg)  12/12/20 294 lb (133.4 kg)  11/25/20 (!) 332 lb (150.6 kg)   Lab Results  Component Value Date   CREATININE 2.11 (H) 12/13/2020   CREATININE 1.84 (H) 12/12/2020   CREATININE 1.67 (H) 11/27/2020    Physical Exam Vitals and nursing note  reviewed.  Constitutional:      Appearance: Oscar [name] is well-developed.  HENT:     Head: Normocephalic and atraumatic.  Neck:     Vascular: No JVD.  Cardiovascular:     Rate and Rhythm: Normal rate. Rhythm irregular.  Pulmonary:     Effort: Pulmonary effort is normal.     Breath sounds: Rales (RLL) present. No wheezing.  Abdominal:     General: There is no distension.     Palpations: Abdomen is soft.     Tenderness: There is no abdominal tenderness.  Musculoskeletal:        General: No tenderness.     Cervical back: Normal range of motion and neck supple.  Skin:    General: Skin is warm and dry.  Neurological:     Mental Status: Oscar [name] is alert and oriented to person, place, and time.  Psychiatric:        Behavior: Behavior normal.        Thought Content: Thought content normal.     Assessment & Plan:  1: Chronic heart failure with reduced ejection fraction- - NYHA class III - mildly fluid overloaded today with rales, worsening symptoms and 5 pound weight gain at home - will send for 80mg  IV lasix/ 31meq PO potassium - BMP/BNP to be drawn - weighing daily; Reminded to call for an overnight weight gain of >2 pounds or a weekly weight gain of >5 pounds.  - weight down 6 pounds from last visit here 6 weeks ago - last saw cardiology Dema Severin) 11/16/19 - bidil gave him migraine headache - advised to drink 48 ounces of water daily - BNP on 11/25/20 was 790.9 - instructed to cut Oscar [name] current entresto in half and take 1/2 tablet BID and see if Oscar [name] BP can tolerate the lower dose of 49/51mg  - AICD in place - on GDMT of carvedilol, jardiance and entreso - has received both COVID vaccines - need to discuss ADHFC with patient - PharmD reconciled medications with the patient  2: HTN- - BP looks good today; gets low at home, adjusting entresto per above - sees PCP [name]) at [place] from 12/13/20 reviewed and shows sodium 134, potassium 3.9, creatinine 2.11  & GFR 39  3: Obstructive sleep apnea- - not wearing CPAP anymore due to comfort level but would like to resume as Oscar [name] thinks Oscar [name] felt better  when Oscar [name] wore it - continues wearing oxygen at 3L at bedtime & PRN during the day  4: Diabetes- - fasting glucose at home was 201 - A1c from 11/24/20 was 7.8%  5: Tobacco use- - continues to smoke 1/2 ppd cigarettes daily - does remove himself from the oxygen when smoking - complete cessation discussed for 3 minutes with him  6: Atrial fibrillation- - currently has xarelto on hold due to recent I & D of abscess - sees surgeon tomorrow and advised him to ask surgeon if Oscar [name] can resume this   Medication bottles reviewed.   Return in 3 weeks or sooner for any questions/problems before then.

## 2020-12-26 NOTE — Patient Instructions (Addendum)
Continue weighing daily and call for an overnight weight gain of > 2 pounds or a weekly weight gain of >5 pounds.   Cut entresto tablet in half and take half a tablet twice day

## 2020-12-26 NOTE — Progress Notes (Signed)
Oscar [name] - PHARMACIST COUNSELING NOTE  ADHERENCE ASSESSMENT   Do you ever forget to take your medication? [] Yes (1) [x] No (0)  Do you ever skip doses due to side effects? [] Yes (1) [x] No (0)  Do you have trouble affording your medicines? [] Yes (1) [x] No (0)  Are you ever unable to pick up your medication due to transportation difficulties? [] Yes (1) [x] No (0)  Do you ever stop taking your medications because you don't believe they are helping? [] Yes (1) [x] No (0)  Total score _0______    Recommendations given to patient about increasing adherence: Pt does not forget medication doses with current medication plan.   Guideline-Directed Medical Therapy/Evidence Based Medicine  ACE/ARB/ARNI: [place] Corporal Blocker: carvedilol Aldosterone Antagonist: N/A Diuretic: torsemide    SUBJECTIVE  HPI:  Past Medical History:  Diagnosis Date  . AICD (automatic cardioverter/defibrillator) present   . Asthma   . Cardiomyopathy ([place])   . CHF (congestive heart failure) ([place])   . Coronary artery disease   . Deafness in right ear   . Diabetes mellitus without complication ([place])   . Dilated cardiomyopathy ([place])   . Dysrhythmia    svt  . 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 GERD (gastroesophageal reflux disease)   . Hyperlipidemia   . Hypertension   . Hypoxemia   . Hypoxemia   . Mild obesity   . Myocardial infarction ([place])    [id number],6/60  . Pancreatitis   . Sleep apnea    osa        OBJECTIVE   Vital signs: HR 80, BP 120/69, weight 135.6 kg ECHO: Date 11/23/20, EF < 20%  BMP Latest Ref Rng & Units 12/13/2020 12/12/2020 11/27/2020  Glucose 70 - 99 mg/dL 274(H) 229(H) 212(H)  BUN 6 - 20 mg/dL 59(H) 54(H) 35(H)  Creatinine 0.61 - 1.24 mg/dL 2.11(H) 1.84(H) 1.67(H)  Sodium 135 - 145 mmol/L 134(L) 133(L) 139  Potassium 3.5 - 5.1 mmol/L 3.9 3.6 3.6  Chloride 98 - 111 mmol/L 94(L) 93(L) 98  CO2 22 - 32 mmol/L 27 27 33(H)   Calcium 8.9 - 10.3 mg/dL 8.9 8.8(L) 8.5(L)    ASSESSMENT 45 yo M presenting to heart failure clinic for follow-up visit. PMH includes history of MI, HTN, HLD, GERD, CAD, CHD, asthma, afib, and diabetes. No barriers to adherence were identified through medication reconciliation    PLAN CHF/HTN - Continue carvedilol 3.125 mg twice daily  - Continue empagliflozin 10 mg daily  - Continue Entresto 49-51 mg twice daily   Diabetes - Continue Levemir 15 units twice daily  - Continue empagliflozin 10 mg daily   Hx of MI - Continue carvedilol 3.125 mg twice daily  - Continue Entresto 49-51 mg twice daily  - Consider addition of aspirin 81 mg daily and restarting atorvastatin 40 mg daily   Afib - Xarelto currently on hold due to recent abscess requiring I&D and wound packing  Pain - Continue oxycodone 5 mg every 6 hours as needed   GERD - Continue pantoprazole 40 mg daily   Asthma - Continue Spiriva inhaler daily  - Continue Anoro inhaler daily  - Continue Ventolin inhaler as needed  Time spent: 10 minutes  Benn Moulder, PharmD Pharmacy Resident  12/26/2020 10:46 AM    Current Outpatient Medications:  .  amiodarone (PACERONE) 200 MG tablet, Take 200 mg by mouth 2 (two) times daily. (Patient not taking: Reported on 12/12/2020), Disp: , Rfl:  .  atorvastatin (LIPITOR) 40 MG tablet, Take 1 tablet (40 mg total) by mouth daily at 6 PM. (Patient taking differently: Take 40 mg by mouth at bedtime.), Disp: 90 tablet, Rfl: 3 .  carvedilol (COREG) 3.125 MG tablet, Take 1 tablet (3.125 mg total) by mouth 2 (two) times daily with a meal., Disp: 60 tablet, Rfl: 0 .  empagliflozin (JARDIANCE) 10 MG TABS tablet, Take 10 mg by mouth daily., Disp: , Rfl:  .  oxyCODONE (OXY IR/ROXICODONE) 5 MG immediate release tablet, Take 1 tablet (5 mg total) by mouth every 6 (six) hours as needed for severe pain or breakthrough pain (use 30 minutes prior to planned dressing changes)., Disp: 20 tablet,  Rfl: 0 .  pantoprazole (PROTONIX) 40 MG tablet, Take 1 tablet (40 mg total) by mouth daily., Disp: 30 tablet, Rfl: 0 .  rivaroxaban (XARELTO) 20 MG TABS tablet, Take 20 mg by mouth daily with supper. (Patient not taking: Reported on 12/12/2020), Disp: , Rfl:  .  sacubitril-valsartan (ENTRESTO) 49-51 MG, Take 1 tablet by mouth 2 (two) times daily., Disp: , Rfl:  .  sacubitril-valsartan (ENTRESTO) 97-103 MG, Take 1 tablet by mouth 2 (two) times daily. (Patient not taking: Reported on 12/12/2020), Disp: 60 tablet, Rfl: 5 .  tiotropium (SPIRIVA) 18 MCG inhalation capsule, Place 18 mcg into inhaler and inhale daily., Disp: , Rfl:  .  torsemide 40 MG TABS, Take 40 mg by mouth 2 (two) times daily. (Patient taking differently: Take 40 mg by mouth daily.), Disp: 120 tablet, Rfl: 1 .  umeclidinium-vilanterol (ANORO ELLIPTA) 62.5-25 MCG/INH AEPB, Inhale 1 puff into the lungs daily in the afternoon., Disp: , Rfl:  .  VENTOLIN HFA 108 (90 Base) MCG/ACT inhaler, Inhale into the lungs., Disp: , Rfl:    COUNSELING POINTS/CLINICAL PEARLS  Carvedilol (Goal: weight less than 85 kg is 25 mg BID, weight greater than 85 kg is 50 mg BID)  Patient should avoid activities requiring coordination until drug effects are realized, as drug may cause dizziness.  This drug may cause diarrhea, nausea, vomiting, arthralgia, back pain, myalgia, headache, vision disorder, erectile dysfunction, reduced libido, or fatigue.  Instruct patient to report signs/symptoms of adverse cardiovascular effects such as hypotension (especially in elderly patients), arrhythmias, syncope, palpitations, angina, or edema.  Drug may mask symptoms of hypoglycemia. Advise diabetic patients to carefully monitor blood sugar levels.  Patient should take drug with food.  Advise patient against sudden discontinuation of drug. Entresto (Goal: 97/103 mg twice daily)  Warn male patient to avoid pregnancy during therapy and to report a pregnancy to a physician.   Advise patient to report symptomatic hypotension.  Side effects may include hyperkalemia, cough, dizziness, or renal failure. Torsemide  Side effects may include excessive urination.  Tell patient to report symptoms of ototoxicity.  Instruct patient to report lightheadedness or syncope.  Warn patient to avoid use of nonprescription NSAID products without first discussing it with their healthcare provider.  DRUGS TO AVOID IN HEART FAILURE  Drug or Class Mechanism  Analgesics . NSAIDs . COX-2 inhibitors . Glucocorticoids  Sodium and water retention, increased systemic vascular resistance, decreased response to diuretics   Diabetes Medications . Metformin . Thiazolidinediones o Rosiglitazone (Avandia) o Pioglitazone (Actos) . DPP4 Inhibitors o Saxagliptin (Onglyza) o Sitagliptin (Januvia)   Lactic acidosis Possible calcium channel blockade   Unknown  Antiarrhythmics . Class I  o Flecainide o Disopyramide . Class III o Sotalol . Other o Dronedarone  Negative inotrope, proarrhythmic   Proarrhythmic, beta blockade  Negative inotrope  Antihypertensives . Alpha Blockers o Doxazosin . Calcium Channel Blockers o Diltiazem o Verapamil o Nifedipine . Central Alpha Adrenergics o Moxonidine . Peripheral Vasodilators o Minoxidil  Increases renin and aldosterone  Negative inotrope    Possible sympathetic withdrawal  Unknown  Anti-infective . Itraconazole . Amphotericin B  Negative inotrope Unknown  Hematologic . Anagrelide . Cilostazol   Possible inhibition of PD IV Inhibition of PD III causing arrhythmias  Neurologic/Psychiatric . Stimulants . Anti-Seizure Drugs o Carbamazepine o Pregabalin . Antidepressants o Tricyclics o Citalopram . Parkinsons o Bromocriptine o Pergolide o Pramipexole . Antipsychotics o Clozapine . Antimigraine o Ergotamine o Methysergide . Appetite suppressants . Bipolar o Lithium  Peripheral alpha and beta agonist  activity  Negative inotrope and chronotrope Calcium channel blockade  Negative inotrope, proarrhythmic Dose-dependent QT prolongation  Excessive serotonin activity/valvular damage Excessive serotonin activity/valvular damage Unknown  IgE mediated hypersensitivy, calcium channel blockade  Excessive serotonin activity/valvular damage Excessive serotonin activity/valvular damage Valvular damage  Direct myofibrillar degeneration, adrenergic stimulation  Antimalarials . Chloroquine . Hydroxychloroquine Intracellular inhibition of lysosomal enzymes  Urologic Agents . Alpha Blockers o Doxazosin o Prazosin o Tamsulosin o Terazosin  Increased renin and aldosterone  Adapted from Page RL, et al. "Drugs That May Cause or Exacerbate Heart Failure: A Scientific Statement from the [place]." Circulation 2016; 916:B84-Y65. DOI: 10.1161/CIR.0000000000000426   MEDICATION ADHERENCES TIPS AND STRATEGIES 1. Taking medication as prescribed improves patient outcomes in heart failure (reduces hospitalizations, improves symptoms, increases survival) 2. Side effects of medications can be managed by decreasing doses, switching agents, stopping drugs, or adding additional therapy. Please let someone in the [facility] know if you have having bothersome side effects so we can modify your regimen. Do not alter your medication regimen without talking to [place].  3. Medication reminders can help patients remember to take drugs on time. If you are missing or forgetting doses you can try linking behaviors, using pill boxes, or an electronic reminder like an alarm on your phone or an app. Some people can also get automated phone calls as medication reminders.

## 2020-12-28 ENCOUNTER — Ambulatory Visit (INDEPENDENT_AMBULATORY_CARE_PROVIDER_SITE_OTHER): Payer: Medicare Other | Admitting: Physician Assistant

## 2020-12-28 ENCOUNTER — Other Ambulatory Visit: Payer: Self-pay

## 2020-12-28 ENCOUNTER — Encounter: Payer: Self-pay | Admitting: Physician Assistant

## 2020-12-28 VITALS — BP 127/86 | HR 81 | Temp 98.3°F | Ht 66.0 in | Wt 300.2 lb

## 2020-12-28 DIAGNOSIS — Z09 Encounter for follow-up examination after completed treatment for conditions other than malignant neoplasm: Secondary | ICD-10-CM

## 2020-12-28 DIAGNOSIS — L02215 Cutaneous abscess of perineum: Secondary | ICD-10-CM

## 2020-12-28 NOTE — Patient Instructions (Signed)
If you have any concerns or questions, please feel free to call our office.    

## 2020-12-28 NOTE — Progress Notes (Signed)
[name]  12/28/2020  HPI: Oscar [name] is a 45 y.o. male 16 days s/p incision and drainage for complex perirectal abscess with Dr Dahlia Byes.   He is doing well Minimal pain with dressing changes No fever, chills His wife has been helping with dressing changes; using less packing each day, minimal serosanguinous drainage  Cx from OR reviewed, he has completed course of Augmentin.   Vital signs: BP 127/86   Pulse 81   Temp 98.3 F (36.8 C) (Oral)   Ht 5\' 6"  (1.676 m)   Wt (!) 300 lb 3.2 oz (136.2 kg)   SpO2 97%   BMI 48.45 kg/m    Physical Exam: Constitutional: Well appearing male, NAD Skin: I&D wound to the perineum, this is approximately 2x2 cm, healing well, wound bed is 100% granulation tissue, no erythema or drainage   Assessment/Plan: This is a 45 y.o. male 16 days s/p incision and drainage for complex perirectal abscess   - Continue wound care at home; reviewed packing instructions. They have been doing a great job with this. Anticipate ~2 weeks of packing  - No further indications for intervention nor ABx  - He will follow up as needed. Advised to call with questions/concerns   -- Edison Simon, PA-C [facility] 12/28/2020, 11:51 AM [phone] M-F: 7am - 4pm

## 2021-01-01 ENCOUNTER — Encounter: Payer: Self-pay | Admitting: Podiatry

## 2021-01-01 ENCOUNTER — Ambulatory Visit (INDEPENDENT_AMBULATORY_CARE_PROVIDER_SITE_OTHER): Payer: Medicare Other | Admitting: Podiatry

## 2021-01-01 ENCOUNTER — Other Ambulatory Visit: Payer: Self-pay

## 2021-01-01 DIAGNOSIS — T148XXA Other injury of unspecified body region, initial encounter: Secondary | ICD-10-CM

## 2021-01-02 ENCOUNTER — Encounter: Payer: Self-pay | Admitting: Podiatry

## 2021-01-02 NOTE — Progress Notes (Signed)
Subjective:  Patient ID: Oscar [name], male    DOB: [birth year],  MRN: [id number]  Chief Complaint  Patient presents with  . Foot Pain    'Its still a little sore and tender but better"    45 y.o. male presents with the above complaint.  Patient presents with complaint of right midfoot pain secondary to contusion.  Patient states that he is doing a lot better the boot has definitely calmed it down.  Occasionally sure but overall much improved.   Review of Systems: Negative except as noted in the HPI. Denies N/V/F/Ch.  Past Medical History:  Diagnosis Date  . AICD (automatic cardioverter/defibrillator) present   . Asthma   . Cardiomyopathy ([place])   . CHF (congestive heart failure) ([place])   . Coronary artery disease   . Deafness in right ear   . Diabetes mellitus without complication ([place])   . Dilated cardiomyopathy ([place])   . Dysrhythmia    svt  . 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 GERD (gastroesophageal reflux disease)   . Hyperlipidemia   . Hypertension   . Hypoxemia   . Hypoxemia   . Mild obesity   . Myocardial infarction ([place])    4098,1191,4/78  . Pancreatitis   . Sleep apnea    osa    Current Outpatient Medications:  .  amiodarone (PACERONE) 200 MG tablet, Take 200 mg by mouth 2 (two) times daily., Disp: , Rfl:  .  atorvastatin (LIPITOR) 40 MG tablet, Take 1 tablet (40 mg total) by mouth daily at 6 PM., Disp: 90 tablet, Rfl: 3 .  carvedilol (COREG) 3.125 MG tablet, Take 1 tablet (3.125 mg total) by mouth 2 (two) times daily with a meal., Disp: 60 tablet, Rfl: 0 .  empagliflozin (JARDIANCE) 10 MG TABS tablet, Take 10 mg by mouth daily., Disp: , Rfl:  .  insulin detemir (LEVEMIR) 100 UNIT/ML injection, Inject 15 Units into the skin 2 (two) times daily., Disp: , Rfl:  .  oxyCODONE (OXY IR/ROXICODONE) 5 MG immediate release tablet, Take 1 tablet (5 mg total) by mouth every 6 (six) hours as needed for severe pain or breakthrough pain (use 30 minutes prior to  planned dressing changes)., Disp: 20 tablet, Rfl: 0 .  pantoprazole (PROTONIX) 40 MG tablet, Take 1 tablet (40 mg total) by mouth daily., Disp: 30 tablet, Rfl: 0 .  rivaroxaban (XARELTO) 20 MG TABS tablet, Take 20 mg by mouth daily with supper., Disp: , Rfl:  .  sacubitril-valsartan (ENTRESTO) 49-51 MG, Take 1 tablet by mouth 2 (two) times daily., Disp: , Rfl:  .  tiotropium (SPIRIVA) 18 MCG inhalation capsule, Place 18 mcg into inhaler and inhale daily., Disp: , Rfl:  .  torsemide 40 MG TABS, Take 40 mg by mouth 2 (two) times daily. (Patient taking differently: Take 40 mg by mouth in the morning and at bedtime.), Disp: 120 tablet, Rfl: 1 .  umeclidinium-vilanterol (ANORO ELLIPTA) 62.5-25 MCG/INH AEPB, Inhale 1 puff into the lungs daily in the afternoon., Disp: , Rfl:  .  VENTOLIN HFA 108 (90 Base) MCG/ACT inhaler, Inhale into the lungs., Disp: , Rfl:   Social History   Tobacco Use  Smoking Status Former Smoker  . Packs/day: 0.50  . Years: 24.00  . Pack years: 12.00  . Types: Cigarettes  . Quit date: 11/25/2020  . Years since quitting: 0.1  Smokeless Tobacco Never Used  Tobacco Comment   4/5 Smoking 5 cigs a day    Allergies  Allergen Reactions  . Ciprofloxacin Other (See Comments) and Swelling    Migraine Headache Migraine Headache  . Isosorb Dinitrate-Hydralazine Other (See Comments)    Migraine Headache Migraine Headache Other reaction(s): Other (See Comments) Migraine Headache Other reaction(s): Other (See Comments) Migraine Headache Migraine Headache Migraine Headache Migraine Headache Migraine Headache Migraine Headache   Objective:  There were no vitals filed for this visit. There is no height or weight on file to calculate BMI. Constitutional Well developed. Well nourished.  Vascular Dorsalis pedis pulses palpable bilaterally. Posterior tibial pulses palpable bilaterally. Capillary refill normal to all digits.  No cyanosis or clubbing noted. Pedal hair growth  normal.  Neurologic Normal speech. Oriented to person, place, and time. Epicritic sensation to light touch grossly present bilaterally.  Dermatologic Nails well groomed and normal in appearance. No open wounds. No skin lesions.  Orthopedic:  No further pain on palpation dorsal lateral midfoot.  No pain with resisted dorsiflexion plantarflexion of the digit ruling out extensor tendinitis/flexor tendinitis.  No pain at the Lisfranc interval.  No pain with range of motion of the tarsometatarsal joints 1 through 5.  No fluctuance noted ruling out hematoma   Radiographs: 3 views of skeletally mature adult right foot: No fractures noted.  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  No other bony abnormalities identified. Assessment:   1. Contusion of soft tissue    Plan:  Patient was evaluated and treated and all questions answered.  Right dorsal lateral foot soft tissue contusion -Clinically healed.  He can transition to regular shoes.  I discussed shoe gear modification as well.  He states understanding.  We will hold off of any kind of steroid injection at this time as his pain is mostly resolved.  No follow-ups on file.

## 2021-01-15 ENCOUNTER — Telehealth: Payer: Self-pay | Admitting: *Deleted

## 2021-01-15 NOTE — Telephone Encounter (Signed)
"  I was calling to let you know I had changed my phone number and I'm trying to schedule an appointment.  Give me a call back at [phone]."

## 2021-01-16 ENCOUNTER — Ambulatory Visit: Payer: Medicare Other | Admitting: Family

## 2021-01-16 ENCOUNTER — Telehealth: Payer: Self-pay | Admitting: Family

## 2021-01-16 NOTE — Telephone Encounter (Signed)
Patient did not show for his [facility] appointment on 01/16/21. Will attempt to reschedule.

## 2021-01-18 NOTE — Telephone Encounter (Signed)
I returned his call and scheduled him an appointment for 02/21/2021.

## 2021-02-04 ENCOUNTER — Ambulatory Visit: Payer: Medicare Other | Admitting: Surgery

## 2021-02-21 ENCOUNTER — Other Ambulatory Visit: Payer: Self-pay

## 2021-02-21 ENCOUNTER — Encounter: Payer: Self-pay | Admitting: Podiatry

## 2021-02-21 ENCOUNTER — Ambulatory Visit (INDEPENDENT_AMBULATORY_CARE_PROVIDER_SITE_OTHER): Payer: Medicare Other | Admitting: Podiatry

## 2021-02-21 DIAGNOSIS — M79676 Pain in unspecified toe(s): Secondary | ICD-10-CM | POA: Diagnosis not present

## 2021-02-21 DIAGNOSIS — Z794 Long term (current) use of insulin: Secondary | ICD-10-CM

## 2021-02-21 DIAGNOSIS — B351 Tinea unguium: Secondary | ICD-10-CM

## 2021-02-21 DIAGNOSIS — E119 Type 2 diabetes mellitus without complications: Secondary | ICD-10-CM

## 2021-02-21 NOTE — Progress Notes (Signed)
Complaint:  Visit Type: Patient returns to my office for continued preventative foot care services. Complaint: Patient states" my nails have grown long and thick and become painful to walk and wear shoes" Patient has been diagnosed with DM with no foot complications. The patient presents for preventative foot care services. No changes to ROS  Podiatric Exam: Vascular: dorsalis pedis and posterior tibial pulses are palpable bilateral. Capillary return is immediate. Temperature gradient is WNL. Skin turgor WNL  Sensorium: Normal Semmes Weinstein monofilament test. Normal tactile sensation bilaterally. Nail Exam: Pt has thick disfigured discolored nails with subungual debris noted bilateral entire nail hallux through fifth toenails Ulcer Exam: There is no evidence of ulcer or pre-ulcerative changes or infection. Orthopedic Exam: Muscle tone and strength are WNL. No limitations in general ROM. No crepitus or effusions noted. Foot type and digits show no abnormalities. 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 Skin: No Porokeratosis. No infection or ulcers.  Heel calus  B/L.  Diagnosis:  Onychomycosis, , Pain in right toe, pain in left toes  Treatment & Plan Procedures and Treatment: Consent by patient was obtained for treatment procedures. The patient understood the discussion of treatment and procedures well. All questions were answered thoroughly reviewed. Debridement of mycotic and hypertrophic toenails, 1 through 5 bilateral and clearing of subungual debris. No ulceration, no infection noted.  Return Visit-Office Procedure: Patient instructed to return to the office for a follow up visit 3 months  for continued evaluation and treatment.    [name] DPM

## 2021-02-25 ENCOUNTER — Ambulatory Visit: Payer: Medicare Other | Admitting: Surgery

## 2021-03-13 ENCOUNTER — Ambulatory Visit (INDEPENDENT_AMBULATORY_CARE_PROVIDER_SITE_OTHER): Payer: Medicare Other | Admitting: Surgery

## 2021-03-13 ENCOUNTER — Ambulatory Visit: Payer: Medicare Other | Admitting: Surgery

## 2021-03-13 ENCOUNTER — Encounter: Payer: Self-pay | Admitting: Surgery

## 2021-03-13 ENCOUNTER — Other Ambulatory Visit: Payer: Self-pay

## 2021-03-13 VITALS — BP 118/88 | HR 84 | Temp 98.6°F | Resp 18 | Ht 66.0 in | Wt 316.0 lb

## 2021-03-13 DIAGNOSIS — L02215 Cutaneous abscess of perineum: Secondary | ICD-10-CM

## 2021-03-13 NOTE — Patient Instructions (Signed)
Follow up as needed

## 2021-03-15 NOTE — Progress Notes (Signed)
Outpatient Surgical Follow Up  03/15/2021  Oscar [name] is an 45 y.o. male.   Chief Complaint  Patient presents with   Follow-up    HPI: Oscar [name] is 3 months out for I&D and debridement of complex perirectal abscess.  He is doing well.  He says occasional some serous drainage.  No fevers no chills he reports significant improvement.  No complaints at this time  Past Medical History:  Diagnosis Date   AICD (automatic cardioverter/defibrillator) present    Asthma    Cardiomyopathy ([place])    CHF (congestive heart failure) ([place])    Coronary artery disease    Deafness in right ear    Diabetes mellitus without complication ([place])    Dilated cardiomyopathy ([place])    Dysrhythmia    svt   Failure in dosage    chronic respiratory    GERD (gastroesophageal reflux disease)    Hyperlipidemia    Hypertension    Hypoxemia    Hypoxemia    Mild obesity    Myocardial infarction [facility])    7989,2119,4/17   Pancreatitis    Sleep apnea    osa    Past Surgical History:  Procedure Laterality Date   CARDIAC CATHETERIZATION  12/11/2014   Procedure: RIGHT/LEFT HEART CATH AND CORONARY ANGIOGRAPHY;  Surgeon: Lorretta Harp, MD;  Location: [facility] CATH LAB;  Service: Cardiovascular;;   ICD LEAD REMOVAL N/A 03/30/2015   Procedure: ICD LEAD REMOVAL;  Surgeon: Marzetta Board, MD;  Location: [facility] ORS;  Service: Cardiovascular;  Laterality: N/A;   IMPLANTABLE CARDIOVERTER DEFIBRILLATOR IMPLANT     INCISION AND DRAINAGE ABSCESS N/A 12/12/2020   Procedure: INCISION AND DRAINAGE ABSCESS;  Surgeon: Jules Husbands, MD;  Location: [facility] ORS;  Service: General;  Laterality: N/A;   INSERT / REPLACE / [place] N/A 12/09/2014   Procedure: LEFT HEART CATHETERIZATION WITH CORONARY ANGIOGRAM;  Surgeon: Burnell Blanks, MD;  Location: [facility] CATH LAB;  Service: Cardiovascular;  Laterality: N/A;    Family History  Problem Relation Age of Onset   Hypertension  Mother    Congestive Heart Failure Mother    Hypertension Sister    Diabetes Sister    Pancreatitis Sister    COPD Sister    Pancreatitis Brother    Anemia Neg Hx    Arrhythmia Neg Hx    Asthma Neg Hx    Clotting disorder Neg Hx    Fainting Neg Hx    Heart attack Neg Hx    Heart disease Neg Hx    Heart failure Neg Hx    Hyperlipidemia Neg Hx     Social History:  reports that he has been smoking cigarettes. He has a 12.00 pack-year smoking history. He has never used smokeless tobacco. He reports that he does not drink alcohol and does not use drugs.  Allergies:  Allergies  Allergen Reactions   Ciprofloxacin Other (See Comments) and Swelling    Migraine Headache Migraine Headache   Isosorb Dinitrate-Hydralazine Other (See Comments)    Migraine Headache Migraine Headache Other reaction(s): Other (See Comments) Migraine Headache Other reaction(s): Other (See Comments) Migraine Headache Migraine Headache Migraine Headache Migraine Headache Migraine Headache Migraine Headache    Medications reviewed.    ROS Full ROS performed and is otherwise negative other than what is stated in HPI   BP 118/88   Pulse 84   Temp 98.6 F (37 C) (Oral)   Resp 18   Ht  5\' 6"  (1.676 m)   Wt (!) 316 lb (143.3 kg)   SpO2 94%   BMI 51.00 kg/m   Physical Exam Vitals and nursing note reviewed. Exam conducted with a chaperone present.  Constitutional:      Appearance: Normal appearance. He is obese.  Eyes:     General: No scleral icterus.       Right eye: No discharge.        Left eye: No discharge.  Pulmonary:     Effort: No respiratory distress.     Breath sounds: No stridor.  Abdominal:     General: There is no distension.     Palpations: Abdomen is soft. There is no mass.     Tenderness: There is no abdominal tenderness.     Hernia: No hernia is present.  Genitourinary:    Penis: Normal.      Testes: Normal.     Comments: Perineal incision healing well.  No evidence  of infection.  There is only an exit pinpoint wound that is healing appropriately.  No need for further packing or special wound care Skin:    General: Skin is warm and dry.     Capillary Refill: Capillary refill takes less than 2 seconds.  Neurological:     General: No focal deficit present.     Mental Status: He is alert and oriented to person, place, and time.  Psychiatric:        Mood and Affect: Mood normal.        Behavior: Behavior normal.        Thought Content: Thought content normal.        Judgment: Judgment normal.       Assessment/Plan: Status post I&D and debridement of complex perirectal abscess.  He is doing well and the wound is healing nicely.  There is no evidence of active infection or necrotizing infection.  Discussed with the patient in detail about appropriate wound care and risk factor modification to include weight management.  RTC as needed   Greater than 50% of the 20 minutes  visit was spent in counseling/coordination of care   Caroleen Hamman, MD [facility] Surgeon

## 2021-03-18 ENCOUNTER — Ambulatory Visit: Payer: Self-pay | Admitting: Internal Medicine

## 2021-03-18 NOTE — Progress Notes (Signed)
Pt canceled his appointment.  

## 2021-04-10 ENCOUNTER — Telehealth: Payer: Self-pay | Admitting: *Deleted

## 2021-04-10 NOTE — Telephone Encounter (Signed)
"  I was calling because I have Gout in the back of my foot.  I'm trying to see if the Dr can prescribe me some medicine for the flare up, Prednisone.  I If you could, give me a call back, I'd appreciate it."

## 2021-04-15 ENCOUNTER — Ambulatory Visit: Payer: Medicare Other | Admitting: Cardiology

## 2021-04-26 NOTE — Telephone Encounter (Signed)
done

## 2021-04-30 ENCOUNTER — Ambulatory Visit: Payer: Medicare Other | Admitting: Podiatry

## 2021-05-02 ENCOUNTER — Other Ambulatory Visit: Payer: Self-pay | Admitting: Gastroenterology

## 2021-05-02 DIAGNOSIS — R748 Abnormal levels of other serum enzymes: Secondary | ICD-10-CM

## 2021-05-02 DIAGNOSIS — K859 Acute pancreatitis without necrosis or infection, unspecified: Secondary | ICD-10-CM

## 2021-05-02 DIAGNOSIS — R300 Dysuria: Secondary | ICD-10-CM

## 2021-05-02 DIAGNOSIS — R1032 Left lower quadrant pain: Secondary | ICD-10-CM

## 2021-05-03 ENCOUNTER — Other Ambulatory Visit: Payer: Self-pay

## 2021-05-03 ENCOUNTER — Emergency Department
Admission: EM | Admit: 2021-05-03 | Discharge: 2021-05-03 | Disposition: A | Discharge status: Home / Self Care | Payer: Medicare Other | Attending: Emergency Medicine | Admitting: Emergency Medicine

## 2021-05-03 ENCOUNTER — Ambulatory Visit
Admission: RE | Admit: 2021-05-03 | Discharge: 2021-05-03 | Disposition: A | Discharge status: Home / Self Care | Payer: Medicare Other | Source: Ambulatory Visit | Attending: Gastroenterology | Admitting: Gastroenterology

## 2021-05-03 DIAGNOSIS — Z950 Presence of cardiac pacemaker: Secondary | ICD-10-CM | POA: Insufficient documentation

## 2021-05-03 DIAGNOSIS — Z794 Long term (current) use of insulin: Secondary | ICD-10-CM | POA: Insufficient documentation

## 2021-05-03 DIAGNOSIS — Z79899 Other long term (current) drug therapy: Secondary | ICD-10-CM | POA: Insufficient documentation

## 2021-05-03 DIAGNOSIS — K859 Acute pancreatitis without necrosis or infection, unspecified: Secondary | ICD-10-CM

## 2021-05-03 DIAGNOSIS — F17210 Nicotine dependence, cigarettes, uncomplicated: Secondary | ICD-10-CM | POA: Diagnosis not present

## 2021-05-03 DIAGNOSIS — R1032 Left lower quadrant pain: Secondary | ICD-10-CM

## 2021-05-03 DIAGNOSIS — K861 Other chronic pancreatitis: Secondary | ICD-10-CM | POA: Insufficient documentation

## 2021-05-03 DIAGNOSIS — I2510 Atherosclerotic heart disease of native coronary artery without angina pectoris: Secondary | ICD-10-CM | POA: Diagnosis not present

## 2021-05-03 DIAGNOSIS — E1140 Type 2 diabetes mellitus with diabetic neuropathy, unspecified: Secondary | ICD-10-CM | POA: Diagnosis not present

## 2021-05-03 DIAGNOSIS — Z7951 Long term (current) use of inhaled steroids: Secondary | ICD-10-CM | POA: Insufficient documentation

## 2021-05-03 DIAGNOSIS — I5043 Acute on chronic combined systolic (congestive) and diastolic (congestive) heart failure: Secondary | ICD-10-CM | POA: Diagnosis not present

## 2021-05-03 DIAGNOSIS — R1013 Epigastric pain: Secondary | ICD-10-CM | POA: Diagnosis present

## 2021-05-03 DIAGNOSIS — R300 Dysuria: Secondary | ICD-10-CM | POA: Insufficient documentation

## 2021-05-03 DIAGNOSIS — I110 Hypertensive heart disease with heart failure: Secondary | ICD-10-CM | POA: Diagnosis not present

## 2021-05-03 DIAGNOSIS — J45909 Unspecified asthma, uncomplicated: Secondary | ICD-10-CM | POA: Diagnosis not present

## 2021-05-03 DIAGNOSIS — J449 Chronic obstructive pulmonary disease, unspecified: Secondary | ICD-10-CM | POA: Insufficient documentation

## 2021-05-03 DIAGNOSIS — R748 Abnormal levels of other serum enzymes: Secondary | ICD-10-CM | POA: Insufficient documentation

## 2021-05-03 LAB — COMPREHENSIVE METABOLIC PANEL
ALT: 26 U/L (ref 0–44)
AST: 28 U/L (ref 15–41)
Albumin: 4.1 g/dL (ref 3.5–5.0)
Alkaline Phosphatase: 159 U/L — ABNORMAL HIGH (ref 38–126)
Anion gap: 9 (ref 5–15)
BUN: 25 mg/dL — ABNORMAL HIGH (ref 6–20)
CO2: 26 mmol/L (ref 22–32)
Calcium: 9.5 mg/dL (ref 8.9–10.3)
Chloride: 96 mmol/L — ABNORMAL LOW (ref 98–111)
Creatinine, Ser: 1.27 mg/dL — ABNORMAL HIGH (ref 0.61–1.24)
GFR, Estimated: >60 mL/min (ref >60)
Glucose, Bld: 197 mg/dL — ABNORMAL HIGH (ref 70–99)
Potassium: 4.2 mmol/L (ref 3.5–5.1)
Sodium: 131 mmol/L — ABNORMAL LOW (ref 135–145)
Total Bilirubin: 1.1 mg/dL (ref 0.3–1.2)
Total Protein: 8.8 g/dL — ABNORMAL HIGH (ref 6.5–8.1)

## 2021-05-03 LAB — URINALYSIS, COMPLETE (UACMP) WITH MICROSCOPIC
Bacteria, UA: NONE SEEN
Glucose, UA: 500 mg/dL — AB
Hgb urine dipstick: NEGATIVE
Ketones, ur: 15 mg/dL — AB
Leukocytes,Ua: NEGATIVE
Nitrite: NEGATIVE
Protein, ur: 100 mg/dL — AB
Specific Gravity, Urine: 1.015 (ref 1.005–1.030)
pH: 5 (ref 5.0–8.0)

## 2021-05-03 LAB — CBC
HCT: 42.8 % (ref 39.0–52.0)
Hemoglobin: 15.7 g/dL (ref 13.0–17.0)
MCH: 33.3 pg (ref 26.0–34.0)
MCHC: 36.7 g/dL — ABNORMAL HIGH (ref 30.0–36.0)
MCV: 90.9 fL (ref 80.0–100.0)
Platelets: 171 10*3/uL (ref 150–400)
RBC: 4.71 MIL/uL (ref 4.22–5.81)
RDW: 11.6 % (ref 11.5–15.5)
WBC: 5 10*3/uL (ref 4.0–10.5)
nRBC: 0 % (ref 0.0–0.2)

## 2021-05-03 LAB — LIPASE, BLOOD: Lipase: 174 U/L — ABNORMAL HIGH (ref 11–51)

## 2021-05-03 MED ORDER — OXYCODONE-ACETAMINOPHEN 5-325 MG PO TABS: 1.0000 | ORAL_TABLET | Freq: Four times a day (QID) | ORAL | 0 refills | Status: DC | PRN

## 2021-05-03 MED ORDER — OXYCODONE-ACETAMINOPHEN 5-325 MG PO TABS
1.0000 | ORAL_TABLET | Freq: Once | ORAL | Status: AC
  Administered 2021-05-03: 1 via ORAL
  Filled 2021-05-03: qty 1

## 2021-05-03 NOTE — ED Triage Notes (Signed)
Pt here after being told to come to the ED to have his gallbladder removed. Pt went for a CT scan yesterday and was advised by his GI doctor to come to the ED. Pt's pain is in the center of his abd and does not radiate. Pt has had N/V and denies diarrhea.

## 2021-05-03 NOTE — ED Provider Notes (Signed)
[facility] Emergency Department Provider Note   ____________________________________________    I have reviewed the triage vital signs and the nursing notes.   HISTORY  Chief Complaint Abdominal Pain     HPI Oscar [name] is a 45 y.o. male with extensive past medical history as detailed below including diabetes, CHF CAD, recurrent pancreatitis who was sent for evaluation by GI.  Patient had CT scan today which demonstrated acute pancreatitis.  Sent in for evaluation of potential gallbladder disease.  Patient reports he has dealt with pancreatitis many times, typically just rest at home and takes analgesics and symptoms resolved.  Past Medical History:  Diagnosis Date   AICD (automatic cardioverter/defibrillator) present    Asthma    Cardiomyopathy ([place])    CHF (congestive heart failure) ([place])    Coronary artery disease    Deafness in right ear    Diabetes mellitus without complication ([place])    Dilated cardiomyopathy ([place])    Dysrhythmia    svt   Failure in dosage    chronic respiratory    GERD (gastroesophageal reflux disease)    Hyperlipidemia    Hypertension    Hypoxemia    Hypoxemia    Mild obesity    Myocardial infarction ([place])    [id number]   Pancreatitis    Sleep apnea    osa    Patient Active Problem List   Diagnosis Date Noted   Perineal abscess 12/12/2020   Flash pulmonary edema ([place])    Pulmonary edema 11/25/2020   Obesity, Class III, BMI 40-49.9 (morbid obesity) ([place]) 11/24/2020   Acute on chronic diastolic (congestive) heart failure ([place]) 11/24/2020   Acute exacerbation of CHF (congestive heart failure) ([place]) 11/23/2020   Hepatic cirrhosis, unspecified hepatic cirrhosis type, unspecified whether ascites present ([place]) 11/23/2020   Type 2 diabetes mellitus with other diabetic neurological complication ([place]) [id number]   Obesity hypoventilation syndrome ([place]) 11/23/2020   Atrial fibrillation with RVR ([place])  11/23/2020   ARF (acute renal failure) ([place]) 03/28/2020   Acute on chronic combined systolic (congestive) and diastolic (congestive) heart failure ([place]) 02/27/2020   COPD (chronic obstructive pulmonary disease) ([place]) 02/27/2020   Sprain of foot 01/26/2020   Foreign body granuloma of skin and subcutaneous tissue 05/26/2019   Acute pancreatitis 05/19/2017   Pancreatitis 09/12/2016   Bronchitis 05/20/2016   Constipation 11/08/2015   Abdominal pain 11/02/2015   Obstructive sleep apnea 07/31/2015   Tobacco use 07/31/2015   Diabetes ([place]) 0000000   Chronic systolic heart failure ([place]) 03/30/2015   Cardiac pacemaker 03/30/2015   Chronic respiratory failure with hypercapnia ([place]) 12/12/2014   Elevated troponin    Non-ischemic cardiomyopathy (HCC)    Hypoxemia    Acute combined systolic and diastolic heart failure (HCC)    SVT (supraventricular tachycardia) ([place]) 12/09/2014   NSTEMI (non-ST elevated myocardial infarction) ([place]) 12/09/2014   Hypertension 12/09/2014    Past Surgical History:  Procedure Laterality Date   CARDIAC CATHETERIZATION  12/11/2014   Procedure: RIGHT/LEFT HEART CATH AND CORONARY ANGIOGRAPHY;  Surgeon: Lorretta Harp, MD;  Location: [facility] CATH LAB;  Service: Cardiovascular;;   ICD LEAD REMOVAL N/A 03/30/2015   Procedure: ICD LEAD REMOVAL;  Surgeon: Marzetta Board, MD;  Location: [facility] ORS;  Service: Cardiovascular;  Laterality: N/A;   IMPLANTABLE CARDIOVERTER DEFIBRILLATOR IMPLANT     INCISION AND DRAINAGE ABSCESS N/A 12/12/2020   Procedure: INCISION AND DRAINAGE ABSCESS;  Surgeon: Jules Husbands, MD;  Location: [facility] ORS;  Service: General;  Laterality:  N/A;   INSERT / REPLACE / REMOVE PACEMAKER     LEFT HEART CATHETERIZATION WITH CORONARY ANGIOGRAM N/A 12/09/2014   Procedure: LEFT HEART CATHETERIZATION WITH CORONARY ANGIOGRAM;  Surgeon: Burnell Blanks, MD;  Location: [facility] C[facility] LAB;  Service: Cardiovascular;  Laterality: N/A;    Prior to Admission medications    Medication Sig Start Date End Date Taking? Authorizing Provider  oxyCODONE-acetaminophen (PERCOCET) 5-325 MG tablet Take 1 tablet by mouth every 6 (six) hours as needed for severe pain. 05/03/21 05/03/22 Yes Lavonia Drafts, MD  amiodarone (PACERONE) 200 MG tablet Take 200 mg by mouth 2 (two) times daily. Patient not taking: Reported on 03/13/2021 11/15/20 03/13/21  [provider]  atorvastatin (LIPITOR) 40 MG tablet Take 1 tablet (40 mg total) by mouth daily at 6 PM. 11/04/18   Alisa Graff, FNP  carvedilol (COREG) 3.125 MG tablet Take 1 tablet (3.125 mg total) by mouth 2 (two) times daily with a meal. 11/25/20   Sharen Hones, MD  empagliflozin (JARDIANCE) 10 MG TABS tablet Take 10 mg by mouth daily.    [provider]  insulin detemir (LEVEMIR) 100 UNIT/ML injection Inject 15 Units into the skin 2 (two) times daily.    [provider]  oxyCODONE (OXY IR/ROXICODONE) 5 MG immediate release tablet Take 1 tablet (5 mg total) by mouth every 6 (six) hours as needed for severe pain or breakthrough pain (use 30 minutes prior to planned dressing changes). 12/13/20   Tylene Fantasia, PA-C  pantoprazole (PROTONIX) 40 MG tablet Take 1 tablet (40 mg total) by mouth daily. 11/25/20 03/13/21  Sharen Hones, MD  rivaroxaban (XARELTO) 20 MG TABS tablet Take 20 mg by mouth daily with supper. Patient not taking: Reported on 03/13/2021    [provider]  sacubitril-valsartan (ENTRESTO) 49-51 MG Take 1 tablet by mouth 2 (two) times daily.    [provider]  tiotropium (SPIRIVA) 18 MCG inhalation capsule Place 18 mcg into inhaler and inhale daily.    [provider]  torsemide 40 MG TABS Take 40 mg by mouth 2 (two) times daily. Patient taking differently: Take 40 mg by mouth in the morning and at bedtime. 11/27/20   Lorella Nimrod, MD  umeclidinium-vilanterol (ANORO ELLIPTA) 62.5-25 MCG/INH AEPB Inhale 1 puff into the lungs daily in the afternoon. 01/31/19   [provider]  VENTOLIN HFA 108 (90 Base) MCG/ACT inhaler Inhale into the lungs. 11/23/20   [provider]     Allergies Ciprofloxacin and Isosorb dinitrate-hydralazine  Family History  Problem Relation Age of Onset   Hypertension Mother    Congestive Heart Failure Mother    Hypertension Sister    Diabetes Sister    Pancreatitis Sister    COPD Sister    Pancreatitis Brother    Anemia Neg Hx    Arrhythmia Neg Hx    Asthma Neg Hx    Clotting disorder Neg Hx    Fainting Neg Hx    Heart attack Neg Hx    Heart disease Neg Hx    Heart failure Neg Hx    Hyperlipidemia Neg Hx     Social History Social History   Tobacco Use   Smoking status: Every Day    Packs/day: 0.50    Years: 24.00    Pack years: 12.00    Types: Cigarettes   Smokeless tobacco: Never   Tobacco comments:    4/5 Smoking 5 cigs a day  Vaping Use   Vaping Use: Never used  Substance Use Topics   Alcohol use: No   Drug use: No    Review of Systems  Constitutional: No fever/chills Eyes: No visual changes.  ENT: No sore throat. Cardiovascular: Denies chest pain. Respiratory: Denies shortness of breath. Gastrointestinal: Epigastric and left upper quadrant pain Genitourinary: Negative for dysuria. Musculoskeletal: Negative for back pain. Skin: Negative for rash. Neurological: Negative for headaches   ____________________________________________   PHYSICAL EXAM:  VITAL SIGNS: ED Triage Vitals  Enc Vitals Group     BP 05/03/21 1220 114/79     Pulse Rate 05/03/21 1220 75     Resp 05/03/21 1220 18     Temp 05/03/21 1220 98.1 F (36.7 C)     Temp src --      SpO2 05/03/21 1220 98 %     Weight 05/03/21 1221 (!) 137 kg (302 lb)     Height 05/03/21 1221 1.676 m ('5\' 6"'$ )     Head Circumference --      Peak Flow --      Pain Score 05/03/21 1221 7     Pain Loc --      Pain Edu? --      Excl. in [place]? --     Constitutional: Alert and oriented. No acute distress. Pleasant and  interactive Eyes: Conjunctivae are normal.  Head: Atraumatic. Nose: No congestion/rhinnorhea. Mouth/Throat: Mucous membranes are moist.   Neck:  Painless ROM Cardiovascular: Normal rate, regular rhythm.  Good peripheral circulation. Respiratory: Normal respiratory effort.  No retractions.  Gastrointestinal: Soft, mild tenderness in the epigastrium and left upper quadrant, no significant right upper quadrant pain  Musculoskeletal:  Warm and well perfused Neurologic:  Normal speech and language. No gross focal neurologic deficits are appreciated.  Skin:  Skin is warm, dry and intact. No rash noted. Psychiatric: Mood and affect are normal. Speech and behavior are normal.  ____________________________________________   LABS (all labs ordered are listed, but only abnormal results are displayed)  Labs Reviewed  LIPASE, BLOOD - Abnormal; Notable for the following components:      Result Value   Lipase 174 (*)    All other components within normal limits  COMPREHENSIVE METABOLIC PANEL - Abnormal; Notable for the following components:   Sodium 131 (*)    Chloride 96 (*)    Glucose, Bld 197 (*)    BUN 25 (*)    Creatinine, Ser 1.27 (*)    Total Protein 8.8 (*)    Alkaline Phosphatase 159 (*)    All other components within normal limits  CBC - Abnormal; Notable for the following components:   MCHC 36.7 (*)    All other components within normal limits  URINALYSIS, COMPLETE (UACMP) WITH MICROSCOPIC - Abnormal; Notable for the following components:   Color, Urine YELLOW (*)    Glucose, UA 500 (*)    Bilirubin Urine SMALL (*)    Ketones, ur 15 (*)    Protein, ur 100 (*)    All other components within normal limits   ____________________________________________  EKG  None ____________________________________________  RADIOLOGY  Reviewed CT scan performed as an outpatient today, no evidence of acute  cholecystitis ____________________________________________   PROCEDURES  Procedure(s) performed: No  Procedures   Critical Care performed: No ____________________________________________   INITIAL IMPRESSION / ASSESSMENT AND PLAN / ED COURSE  Pertinent labs & imaging results that were available during my care of the patient were reviewed by me and considered in my medical decision making (see chart for details).   Patient  presents with epigastric pain, with outpatient CT demonstrating acute noncomplicated pancreatitis.  His lipase is elevated as expected.  LFTs are overall reassuring.  No evidence of cholecystitis on CT scan, possibility of biliary cause of his recurrent pancreatitis although that appears unlikely.  Patient is stable and would like to go home to treat his pancreatitis as per usual.  No indication for emergent surgery, he notes that he will return if any worsening.  Lab work is overall reassuring, discharge is appropriate.    ____________________________________________   FINAL CLINICAL IMPRESSION(S) / ED DIAGNOSES  Final diagnoses:  Chronic pancreatitis, unspecified pancreatitis type ([place])        Note:  This document was prepared using Dragon voice recognition software and may include unintentional dictation errors.    Lavonia Drafts, MD 05/03/21 1339

## 2021-05-30 ENCOUNTER — Ambulatory Visit: Payer: Medicare Other | Admitting: Podiatry

## 2021-06-11 ENCOUNTER — Ambulatory Visit (INDEPENDENT_AMBULATORY_CARE_PROVIDER_SITE_OTHER): Payer: Medicare Other | Admitting: Dermatology

## 2021-06-11 ENCOUNTER — Other Ambulatory Visit: Payer: Self-pay

## 2021-06-11 ENCOUNTER — Encounter: Payer: Self-pay | Admitting: Dermatology

## 2021-06-11 DIAGNOSIS — B360 Pityriasis versicolor: Secondary | ICD-10-CM

## 2021-06-11 DIAGNOSIS — L83 Acanthosis nigricans: Secondary | ICD-10-CM

## 2021-06-11 DIAGNOSIS — L810 Postinflammatory hyperpigmentation: Secondary | ICD-10-CM | POA: Diagnosis not present

## 2021-06-11 DIAGNOSIS — L739 Follicular disorder, unspecified: Secondary | ICD-10-CM

## 2021-06-11 MED ORDER — TAZAROTENE 0.1 % EX CREA: TOPICAL_CREAM | Freq: Every day | CUTANEOUS | 3 refills | Status: DC

## 2021-06-11 MED ORDER — KETOCONAZOLE 2 % EX CREA: TOPICAL_CREAM | CUTANEOUS | 2 refills | Status: DC

## 2021-06-11 MED ORDER — CLINDAMYCIN PHOSPHATE 1 % EX LOTN: TOPICAL_LOTION | CUTANEOUS | 3 refills | Status: DC

## 2021-06-11 NOTE — Patient Instructions (Addendum)
Continue Benzoyl peroxide wash daily in shower. (Panoxyl, CeraVe, Neutrogena, Clean & Clear)  Clindamycin daily after shower to scalp, chest, back.  Tazarotene cream at bedtime to scalp, chest, back.   Ketoconazole cream to neck twice daily.   Topical retinoid medications like tretinoin/Retin-A, adapalene/Differin, tazarotene/Fabior, and Epiduo/Epiduo Forte can cause dryness and irritation when first started. Only apply a pea-sized amount to the entire affected area. Avoid applying it around the eyes, edges of mouth and creases at the nose. If you experience irritation, use a good moisturizer first and/or apply the medicine less often. If you are doing well with the medicine, you can increase how often you use it until you are applying every night. Be careful with sun protection while using this medication as it can make you sensitive to the sun. This medicine should not be used by pregnant women.    Benzoyl peroxide can cause dryness and irritation of the skin. It can also bleach fabric. When used together with Aczone (dapsone) cream, it can stain the skin orange.   If you have any questions or concerns for your doctor, please call our main line at [phone] and press option 4 to reach your doctor's medical assistant. If no one answers, please leave a voicemail as directed and we will return your call as soon as possible. Messages left after 4 pm will be answered the following business day.   You may also send [place] a message via [place]. We typically respond to MyChart messages within 1-2 business days.  For prescription refills, please ask your pharmacy to contact our office. Our fax number is [phone].  If you have an urgent issue when the clinic is closed that cannot wait until the next business day, you can page your doctor at the number below.    Please note that while we do our best to be available for urgent issues outside of office hours, we are not available 24/7.   If you have  an urgent issue and are unable to reach [place], you may choose to seek medical care at your doctor's office, retail clinic, urgent care center, or emergency room.  If you have a medical emergency, please immediately call 911 or go to the emergency department.  Pager Numbers  - Dr. Nehemiah Massed: [phone]  - Dr. Laurence Ferrari: [phone]  - Dr. Nicole Kindred: [phone]  In the event of inclement weather, please call our main line at [phone] for an update on the status of any delays or closures.  Dermatology Medication Tips: Please keep the boxes that topical medications come in in order to help keep track of the instructions about where and how to use these. Pharmacies typically print the medication instructions only on the boxes and not directly on the medication tubes.   If your medication is too expensive, please contact our office at [phone] option 4 or send [place] a message through [place].   We are unable to tell what your co-pay for medications will be in advance as this is different depending on your insurance coverage. However, we may be able to find a substitute medication at lower cost or fill out paperwork to get insurance to cover a needed medication.   If a prior authorization is required to get your medication covered by your insurance company, please allow [place] 1-2 business days to complete this process.  Drug prices often vary depending on where the prescription is filled and some pharmacies may offer cheaper prices.  The website www.goodrx.com contains coupons for medications through  different pharmacies. The prices here do not account for what the cost may be with help from insurance (it may be cheaper with your insurance), but the website can give you the price if you did not use any insurance.  - You can print the associated coupon and take it with your prescription to the pharmacy.  - You may also stop by our office during regular business hours and pick up a GoodRx coupon card.   - If you need your prescription sent electronically to a different pharmacy, notify our office through [facility] or by phone at [phone] option 4.

## 2021-06-11 NOTE — Progress Notes (Signed)
New Patient Visit  Subjective  Oscar [name] is a 45 y.o. male who presents for the following: Rash (Chest, neck and back. C/O blotches and breakouts. Patient picks at areas. Does not want oral medication. Would like topical cream. Dur: years.).  Pt has other medical problems including DM, heart disease/failure, and doesn't want to add to his medication list.  Referred by [facility].   Review of Systems: No other skin or systemic complaints except as noted in HPI or Assessment and Plan.  Objective  Well appearing patient in no apparent distress; mood and affect are within normal limits.  All skin waist up examined.  occipital scalp, chest, arms, back Numerous hyperpigmented follicular based papules and macules  Neck Velvety hyperpigmented patch with surrounding slightly scaly hyperpigmented macules   Assessment & Plan  Folliculitis occipital scalp, chest, arms, back  Chronic, with Post-Inflammatory Hyperpigmentation, pt requests topical treatment only.  Continue Benzoyl peroxide wash daily in shower. Let sit several minutes prior to rinsing  Start Clindamycin lotion daily after shower to scalp, neck, chest, back.  Start Tazarotene 0.1% cream at bedtime to scalp, neck, chest, back.   Topical retinoid medications like tretinoin/Retin-A, adapalene/Differin, tazarotene/Fabior, and Epiduo/Epiduo Forte can cause dryness and irritation when first started. Only apply a pea-sized amount to the entire affected area. Avoid applying it around the eyes, edges of mouth and creases at the nose. If you experience irritation, use a good moisturizer first and/or apply the medicine less often. If you are doing well with the medicine, you can increase how often you use it until you are applying every night. Be careful with sun protection while using this medication as it can make you sensitive to the sun. This medicine should not be used by pregnant women.   Benzoyl  peroxide can cause dryness and irritation of the skin. It can also bleach fabric. When used together with Aczone (dapsone) cream, it can stain the skin orange.     clindamycin (CLEOCIN-T) 1 % lotion - occipital scalp, chest, arms, back Apply to affected areas daily after shower.  tazarotene (AVAGE) 0.1 % cream - occipital scalp, chest, arms, back Apply topically at bedtime.  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  It is generally asymptomatic. The rash is due to overgrowth of a common type of yeast present on everyone's skin and it is not contagious.  It tends to flare more in the summer due to increased sweating on trunk.  After rash is treated, the scaliness will resolve, but the discoloration will take longer to return to normal pigmentation. The periodic use of an OTC medicated soap/shampoo with zinc or selenium sulfide can be helpful to prevent yeast overgrowth and recurrence.   Acanthosis nigricans is a chronic localized skin disorder manifesting with hyperpigmented, velvety plaques typically located in flexural/body fold regions. It is most commonly associated with obesity and is linked to type 2 diabetes, insulin resistance, and metabolic syndrome.  Rarely it can be associated with other systemic causes.   ketoconazole (NIZORAL) 2 % cream - Neck Apply twice daily to neck until clear  Return for rash recheck 3-4 months.  I, Oscar [name], CMA, am acting as scribe for Oscar Patty, MD.  Documentation: I have reviewed the above documentation for accuracy and completeness, and I agree with the  above.  Oscar Patty MD

## 2021-07-01 ENCOUNTER — Ambulatory Visit: Payer: Medicare Other | Admitting: Surgery

## 2021-07-04 ENCOUNTER — Other Ambulatory Visit: Payer: Self-pay

## 2021-07-04 ENCOUNTER — Ambulatory Visit (INDEPENDENT_AMBULATORY_CARE_PROVIDER_SITE_OTHER): Payer: Medicare Other | Admitting: Podiatry

## 2021-07-04 ENCOUNTER — Encounter: Payer: Self-pay | Admitting: Podiatry

## 2021-07-04 ENCOUNTER — Encounter (INDEPENDENT_AMBULATORY_CARE_PROVIDER_SITE_OTHER): Payer: Self-pay

## 2021-07-04 DIAGNOSIS — Z794 Long term (current) use of insulin: Secondary | ICD-10-CM | POA: Diagnosis not present

## 2021-07-04 DIAGNOSIS — E119 Type 2 diabetes mellitus without complications: Secondary | ICD-10-CM

## 2021-07-04 DIAGNOSIS — B351 Tinea unguium: Secondary | ICD-10-CM

## 2021-07-04 DIAGNOSIS — M79676 Pain in unspecified toe(s): Secondary | ICD-10-CM

## 2021-07-04 NOTE — Progress Notes (Signed)
Complaint:  Visit Type: Patient returns to my office for continued preventative foot care services. Complaint: Patient states" my nails have grown long and thick and become painful to walk and wear shoes" Patient has been diagnosed with DM with no foot complications. The patient presents for preventative foot care services. No changes to ROS  Podiatric Exam: Vascular: dorsalis pedis and posterior tibial pulses are palpable bilateral. Capillary return is immediate. Temperature gradient is WNL. Skin turgor WNL  Sensorium: Normal Semmes Weinstein monofilament test. Normal tactile sensation bilaterally. Nail Exam: Pt has thick disfigured discolored nails with subungual debris noted bilateral entire nail hallux through fifth toenails Ulcer Exam: There is no evidence of ulcer or pre-ulcerative changes or infection. Orthopedic Exam: Muscle tone and strength are WNL. No limitations in general ROM. No crepitus or effusions noted. Foot type and digits show no abnormalities. 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 Skin: No Porokeratosis. No infection or ulcers.  Heel calus  B/L.  Diagnosis:  Onychomycosis, , Pain in right toe, pain in left toes  Treatment & Plan Procedures and Treatment: Consent by patient was obtained for treatment procedures. The patient understood the discussion of treatment and procedures well. All questions were answered thoroughly reviewed. Debridement of mycotic and hypertrophic toenails, 1 through 5 bilateral and clearing of subungual debris. No ulceration, no infection noted.  Return Visit-Office Procedure: Patient instructed to return to the office for a follow up visit 3 months  for continued evaluation and treatment. Discussed nail surgery to remove pincer nails both hallux in future.    [name] DPM

## 2021-09-04 ENCOUNTER — Telehealth: Payer: Self-pay | Admitting: *Deleted

## 2021-09-04 NOTE — Telephone Encounter (Signed)
Having a gout flare, would like a prescription

## 2021-09-05 ENCOUNTER — Other Ambulatory Visit: Payer: Self-pay

## 2021-09-05 ENCOUNTER — Other Ambulatory Visit: Payer: Self-pay | Admitting: Podiatry

## 2021-09-05 ENCOUNTER — Emergency Department
Admission: EM | Admit: 2021-09-05 | Discharge: 2021-09-05 | Disposition: A | Discharge status: Home / Self Care | Payer: Medicare Other | Attending: Emergency Medicine | Admitting: Emergency Medicine

## 2021-09-05 DIAGNOSIS — Z5321 Procedure and treatment not carried out due to patient leaving prior to being seen by health care provider: Secondary | ICD-10-CM | POA: Diagnosis not present

## 2021-09-05 DIAGNOSIS — M79672 Pain in left foot: Secondary | ICD-10-CM | POA: Diagnosis present

## 2021-09-05 DIAGNOSIS — M79674 Pain in right toe(s): Secondary | ICD-10-CM | POA: Insufficient documentation

## 2021-09-05 MED ORDER — COLCHICINE 0.6 MG PO TABS: 0.6000 mg | ORAL_TABLET | Freq: Every day | ORAL | 0 refills | Status: DC

## 2021-09-05 NOTE — ED Triage Notes (Signed)
Pt comes into the ED via EMS from home with c/o BL foot pain, thinks it is gout with a hx of the same.   133/99 97%RA 99HR

## 2021-09-23 ENCOUNTER — Ambulatory Visit: Payer: Medicare Other | Admitting: Dermatology

## 2021-09-26 DIAGNOSIS — M1000 Idiopathic gout, unspecified site: Secondary | ICD-10-CM | POA: Insufficient documentation

## 2021-09-26 DIAGNOSIS — E785 Hyperlipidemia, unspecified: Secondary | ICD-10-CM | POA: Insufficient documentation

## 2021-09-26 DIAGNOSIS — Z95810 Presence of automatic (implantable) cardiac defibrillator: Secondary | ICD-10-CM | POA: Insufficient documentation

## 2021-09-26 DIAGNOSIS — N1831 Chronic kidney disease, stage 3a: Secondary | ICD-10-CM | POA: Insufficient documentation

## 2021-10-03 ENCOUNTER — Ambulatory Visit: Payer: Medicare Other | Admitting: Podiatry

## 2021-10-03 ENCOUNTER — Emergency Department: Payer: Medicare Other

## 2021-10-03 ENCOUNTER — Other Ambulatory Visit: Payer: Self-pay

## 2021-10-03 ENCOUNTER — Inpatient Hospital Stay
Admission: EM | Admit: 2021-10-03 | Discharge: 2021-10-05 | DRG: 439 | Disposition: A | Discharge status: Home / Self Care | Payer: Medicare Other | Attending: Obstetrics and Gynecology | Admitting: Obstetrics and Gynecology

## 2021-10-03 ENCOUNTER — Encounter: Payer: Self-pay | Admitting: Emergency Medicine

## 2021-10-03 DIAGNOSIS — K859 Acute pancreatitis without necrosis or infection, unspecified: Secondary | ICD-10-CM | POA: Diagnosis present

## 2021-10-03 DIAGNOSIS — J449 Chronic obstructive pulmonary disease, unspecified: Secondary | ICD-10-CM | POA: Diagnosis present

## 2021-10-03 DIAGNOSIS — F17210 Nicotine dependence, cigarettes, uncomplicated: Secondary | ICD-10-CM | POA: Diagnosis present

## 2021-10-03 DIAGNOSIS — G4733 Obstructive sleep apnea (adult) (pediatric): Secondary | ICD-10-CM | POA: Diagnosis present

## 2021-10-03 DIAGNOSIS — H9191 Unspecified hearing loss, right ear: Secondary | ICD-10-CM | POA: Diagnosis present

## 2021-10-03 DIAGNOSIS — J9612 Chronic respiratory failure with hypercapnia: Secondary | ICD-10-CM | POA: Diagnosis present

## 2021-10-03 DIAGNOSIS — K861 Other chronic pancreatitis: Secondary | ICD-10-CM | POA: Diagnosis present

## 2021-10-03 DIAGNOSIS — E1122 Type 2 diabetes mellitus with diabetic chronic kidney disease: Secondary | ICD-10-CM | POA: Diagnosis present

## 2021-10-03 DIAGNOSIS — Z95810 Presence of automatic (implantable) cardiac defibrillator: Secondary | ICD-10-CM

## 2021-10-03 DIAGNOSIS — I480 Paroxysmal atrial fibrillation: Secondary | ICD-10-CM | POA: Diagnosis present

## 2021-10-03 DIAGNOSIS — J9611 Chronic respiratory failure with hypoxia: Secondary | ICD-10-CM | POA: Diagnosis present

## 2021-10-03 DIAGNOSIS — I10 Essential (primary) hypertension: Secondary | ICD-10-CM | POA: Diagnosis present

## 2021-10-03 DIAGNOSIS — Z6841 Body Mass Index (BMI) 40.0 and over, adult: Secondary | ICD-10-CM

## 2021-10-03 DIAGNOSIS — N183 Chronic kidney disease, stage 3 unspecified: Secondary | ICD-10-CM | POA: Diagnosis present

## 2021-10-03 DIAGNOSIS — Z888 Allergy status to other drugs, medicaments and biological substances status: Secondary | ICD-10-CM

## 2021-10-03 DIAGNOSIS — Z7984 Long term (current) use of oral hypoglycemic drugs: Secondary | ICD-10-CM

## 2021-10-03 DIAGNOSIS — D72819 Decreased white blood cell count, unspecified: Secondary | ICD-10-CM | POA: Diagnosis present

## 2021-10-03 DIAGNOSIS — Z825 Family history of asthma and other chronic lower respiratory diseases: Secondary | ICD-10-CM

## 2021-10-03 DIAGNOSIS — I130 Hypertensive heart and chronic kidney disease with heart failure and stage 1 through stage 4 chronic kidney disease, or unspecified chronic kidney disease: Secondary | ICD-10-CM | POA: Diagnosis present

## 2021-10-03 DIAGNOSIS — D696 Thrombocytopenia, unspecified: Secondary | ICD-10-CM | POA: Diagnosis present

## 2021-10-03 DIAGNOSIS — I420 Dilated cardiomyopathy: Secondary | ICD-10-CM | POA: Diagnosis present

## 2021-10-03 DIAGNOSIS — I2510 Atherosclerotic heart disease of native coronary artery without angina pectoris: Secondary | ICD-10-CM | POA: Diagnosis present

## 2021-10-03 DIAGNOSIS — I428 Other cardiomyopathies: Secondary | ICD-10-CM

## 2021-10-03 DIAGNOSIS — Z833 Family history of diabetes mellitus: Secondary | ICD-10-CM

## 2021-10-03 DIAGNOSIS — Z7901 Long term (current) use of anticoagulants: Secondary | ICD-10-CM

## 2021-10-03 DIAGNOSIS — I214 Non-ST elevation (NSTEMI) myocardial infarction: Secondary | ICD-10-CM | POA: Diagnosis present

## 2021-10-03 DIAGNOSIS — Z20822 Contact with and (suspected) exposure to covid-19: Secondary | ICD-10-CM | POA: Diagnosis present

## 2021-10-03 DIAGNOSIS — M109 Gout, unspecified: Secondary | ICD-10-CM | POA: Diagnosis present

## 2021-10-03 DIAGNOSIS — E785 Hyperlipidemia, unspecified: Secondary | ICD-10-CM | POA: Diagnosis present

## 2021-10-03 DIAGNOSIS — Z881 Allergy status to other antibiotic agents status: Secondary | ICD-10-CM

## 2021-10-03 DIAGNOSIS — Z91041 Radiographic dye allergy status: Secondary | ICD-10-CM

## 2021-10-03 DIAGNOSIS — K219 Gastro-esophageal reflux disease without esophagitis: Secondary | ICD-10-CM | POA: Diagnosis present

## 2021-10-03 DIAGNOSIS — Z8249 Family history of ischemic heart disease and other diseases of the circulatory system: Secondary | ICD-10-CM

## 2021-10-03 DIAGNOSIS — I252 Old myocardial infarction: Secondary | ICD-10-CM

## 2021-10-03 DIAGNOSIS — I5022 Chronic systolic (congestive) heart failure: Secondary | ICD-10-CM | POA: Diagnosis present

## 2021-10-03 DIAGNOSIS — Z79899 Other long term (current) drug therapy: Secondary | ICD-10-CM

## 2021-10-03 DIAGNOSIS — I509 Heart failure, unspecified: Secondary | ICD-10-CM

## 2021-10-03 DIAGNOSIS — Z794 Long term (current) use of insulin: Secondary | ICD-10-CM

## 2021-10-03 DIAGNOSIS — E119 Type 2 diabetes mellitus without complications: Secondary | ICD-10-CM

## 2021-10-03 LAB — HEPATIC FUNCTION PANEL
ALT: 28 U/L (ref 0–44)
AST: 25 U/L (ref 15–41)
Albumin: 3.8 g/dL (ref 3.5–5.0)
Alkaline Phosphatase: 156 U/L — ABNORMAL HIGH (ref 38–126)
Bilirubin, Direct: 0.1 mg/dL (ref 0.0–0.2)
Indirect Bilirubin: 0.4 mg/dL (ref 0.3–0.9)
Total Bilirubin: 0.5 mg/dL (ref 0.3–1.2)
Total Protein: 8.1 g/dL (ref 6.5–8.1)

## 2021-10-03 LAB — CBC
HCT: 45.5 % (ref 39.0–52.0)
Hemoglobin: 15.7 g/dL (ref 13.0–17.0)
MCH: 32.4 pg (ref 26.0–34.0)
MCHC: 34.5 g/dL (ref 30.0–36.0)
MCV: 93.8 fL (ref 80.0–100.0)
Platelets: 146 10*3/uL — ABNORMAL LOW (ref 150–400)
RBC: 4.85 MIL/uL (ref 4.22–5.81)
RDW: 11.6 % (ref 11.5–15.5)
WBC: 3.2 10*3/uL — ABNORMAL LOW (ref 4.0–10.5)
nRBC: 0 % (ref 0.0–0.2)

## 2021-10-03 LAB — BASIC METABOLIC PANEL
Anion gap: 8 (ref 5–15)
BUN: 33 mg/dL — ABNORMAL HIGH (ref 6–20)
CO2: 26 mmol/L (ref 22–32)
Calcium: 9.3 mg/dL (ref 8.9–10.3)
Chloride: 98 mmol/L (ref 98–111)
Creatinine, Ser: 1.44 mg/dL — ABNORMAL HIGH (ref 0.61–1.24)
GFR, Estimated: >60 mL/min (ref >60)
Glucose, Bld: 156 mg/dL — ABNORMAL HIGH (ref 70–99)
Potassium: 4 mmol/L (ref 3.5–5.1)
Sodium: 132 mmol/L — ABNORMAL LOW (ref 135–145)

## 2021-10-03 LAB — RESP PANEL BY RT-PCR (FLU A&B, COVID) ARPGX2
Influenza A by PCR: NEGATIVE
Influenza B by PCR: NEGATIVE
SARS Coronavirus 2 by RT PCR: NEGATIVE

## 2021-10-03 LAB — ETHANOL: Alcohol, Ethyl (B): <10 mg/dL (ref <10)

## 2021-10-03 LAB — TROPONIN I (HIGH SENSITIVITY)
Troponin I (High Sensitivity): 52 ng/L — ABNORMAL HIGH (ref <18)
Troponin I (High Sensitivity): 48 ng/L — ABNORMAL HIGH (ref <18)

## 2021-10-03 LAB — TRIGLYCERIDES: Triglycerides: 116 mg/dL (ref <150)

## 2021-10-03 LAB — LIPASE, BLOOD: Lipase: 65 U/L — ABNORMAL HIGH (ref 11–51)

## 2021-10-03 LAB — GLUCOSE, CAPILLARY
Glucose-Capillary: 133 mg/dL — ABNORMAL HIGH (ref 70–99)
Glucose-Capillary: 118 mg/dL — ABNORMAL HIGH (ref 70–99)

## 2021-10-03 MED ORDER — LACTATED RINGERS IV SOLN: INTRAVENOUS | Status: DC

## 2021-10-03 MED ORDER — ENOXAPARIN SODIUM 80 MG/0.8ML IJ SOSY
0.5000 mg/kg | PREFILLED_SYRINGE | INTRAMUSCULAR | Status: DC
  Administered 2021-10-03 – 2021-10-04 (×2): 65 mg via SUBCUTANEOUS
  Filled 2021-10-03 (×3): qty 0.65

## 2021-10-03 MED ORDER — MORPHINE SULFATE (PF) 2 MG/ML IV SOLN
1.0000 mg | INTRAVENOUS | Status: DC | PRN
  Administered 2021-10-03 – 2021-10-05 (×3): 1 mg via INTRAVENOUS
  Filled 2021-10-03 (×3): qty 1

## 2021-10-03 MED ORDER — SODIUM CHLORIDE 0.9 % IV BOLUS
500.0000 mL | Freq: Once | INTRAVENOUS | Status: AC
  Administered 2021-10-03: 500 mL via INTRAVENOUS

## 2021-10-03 MED ORDER — INSULIN ASPART 100 UNIT/ML IJ SOLN
0.0000 [IU] | Freq: Three times a day (TID) | INTRAMUSCULAR | Status: DC
  Filled 2021-10-03 (×2): qty 1

## 2021-10-03 MED ORDER — PANTOPRAZOLE SODIUM 40 MG PO TBEC
40.0000 mg | DELAYED_RELEASE_TABLET | Freq: Every day | ORAL | Status: DC
Start: 2021-10-03 — End: 2021-10-05
  Administered 2021-10-03 – 2021-10-05 (×3): 40 mg via ORAL
  Filled 2021-10-03 (×3): qty 1

## 2021-10-03 MED ORDER — HYDROMORPHONE HCL 1 MG/ML IJ SOLN
1.0000 mg | Freq: Once | INTRAMUSCULAR | Status: AC
  Administered 2021-10-03: 1 mg via INTRAVENOUS
  Filled 2021-10-03: qty 1

## 2021-10-03 MED ORDER — ONDANSETRON HCL 4 MG/2ML IJ SOLN
4.0000 mg | Freq: Once | INTRAMUSCULAR | Status: AC
  Administered 2021-10-03: 4 mg via INTRAVENOUS
  Filled 2021-10-03: qty 2

## 2021-10-03 MED ORDER — SACUBITRIL-VALSARTAN 49-51 MG PO TABS
1.0000 | ORAL_TABLET | Freq: Two times a day (BID) | ORAL | Status: DC
  Administered 2021-10-03 – 2021-10-05 (×4): 1 via ORAL
  Filled 2021-10-03 (×5): qty 1

## 2021-10-03 MED ORDER — ENOXAPARIN SODIUM 40 MG/0.4ML IJ SOSY: 40.0000 mg | PREFILLED_SYRINGE | INTRAMUSCULAR | Status: DC

## 2021-10-03 MED ORDER — TORSEMIDE 20 MG PO TABS
20.0000 mg | ORAL_TABLET | Freq: Every day | ORAL | Status: DC
  Administered 2021-10-03: 20 mg via ORAL
  Filled 2021-10-03: qty 1

## 2021-10-03 NOTE — ED Provider Notes (Signed)
[facility] Provider Note    Event Date/Time   First MD Initiated Contact with Patient 10/03/21 1454     (approximate)   History   Chest Pain, Abdominal Pain, and Headache   HPI  Oscar [name] is a 46 y.o. male  with a h/o DM, CAD, COPD, CHF who comes to the ED c/o upper abd pain x 3 days.  Loss of appetite, poor oral intake. Feels dehydrated. pain radiates to back. No chest pain or acute sob. No fever. No black/bloody stool.       Physical Exam   Triage Vital Signs: ED Triage Vitals  Enc Vitals Group     BP 10/03/21 1053 97/78     Pulse Rate 10/03/21 1053 95     Resp 10/03/21 1053 20     Temp 10/03/21 1053 98.1 F (36.7 C)     Temp Source 10/03/21 1053 Oral     SpO2 10/03/21 1053 98 %     Weight 10/03/21 1049 287 lb (130.2 kg)     Height 10/03/21 1049 5\' 6"  (1.676 m)     Head Circumference --      Peak Flow --      Pain Score 10/03/21 1049 6     Pain Loc --      Pain Edu? --      Excl. in [place]? --     Most recent vital signs: Vitals:   10/03/21 1233 10/03/21 1441  BP: 101/73 107/70  Pulse: 88 78  Resp: 18 16  Temp:    SpO2: 97% 97%     General: Awake, no distress.  CV:  Good peripheral perfusion. RRR Resp:  Normal effort. CTAB Abd:  No distention. Diffuse upper abd tenderness. Abd is soft, no peritoneal signs. Other:  No peripheral edema. No orthopnea. Dry oral mucosa.   ED Results / Procedures / Treatments   Labs (all labs ordered are listed, but only abnormal results are displayed) Labs Reviewed  BASIC METABOLIC PANEL - Abnormal; Notable for the following components:      Result Value   Sodium 132 (*)    Glucose, Bld 156 (*)    BUN 33 (*)    Creatinine, Ser 1.44 (*)    All other components within normal limits  CBC - Abnormal; Notable for the following components:   WBC 3.2 (*)    Platelets 146 (*)    All other components within normal limits  HEPATIC FUNCTION PANEL - Abnormal; Notable for the following  components:   Alkaline Phosphatase 156 (*)    All other components within normal limits  LIPASE, BLOOD - Abnormal; Notable for the following components:   Lipase 65 (*)    All other components within normal limits  TROPONIN I (HIGH SENSITIVITY) - Abnormal; Notable for the following components:   Troponin I (High Sensitivity) 52 (*)    All other components within normal limits  TROPONIN I (HIGH SENSITIVITY) - Abnormal; Notable for the following components:   Troponin I (High Sensitivity) 48 (*)    All other components within normal limits  RESP PANEL BY RT-PCR (FLU A&B, COVID) ARPGX2  TRIGLYCERIDES  ETHANOL  URINE DRUG SCREEN, QUALITATIVE ([facility] ONLY)     EKG Interpreted by me Sinus rhythm, rate of 80. Nl axis. Mildly prolonged QTc. LBBB. No acute ischemic changes.  3 PVCs on the strip.    RADIOLOGY Chest xray viewed and interpreted by me. Shows cardiomegaly without infiltrate or pulm edema.  Radiology report reviewed.   CT a/p shows acute pancreatitis    PROCEDURES:  Critical Care performed: No  Procedures   MEDICATIONS ORDERED IN ED: Medications  HYDROmorphone (DILAUDID) injection 1 mg (1 mg Intravenous Given 10/03/21 1251)  ondansetron (ZOFRAN) injection 4 mg (4 mg Intravenous Given 10/03/21 1251)  sodium chloride 0.9 % bolus 500 mL (500 mLs Intravenous New Bag/Given 10/03/21 1251)     IMPRESSION / MDM / ASSESSMENT AND PLAN / ED COURSE  I reviewed the triage vital signs and the nursing notes.                              Differential diagnosis includes, but is not limited to, pancreatitis, gallbladder disease, choledocholithiasis, gastritis, bowel obstruction, colitis    Pt p/w abd pain and dehydration.  Labs show mild lipase elevation and CT confirms acute pancreatitis.  Troponins are flat and likeyl at chronic baseline.  Pt given IVF for hydration and IV dilaudid for pain relief. Case d/w hospitalist for further management.     FINAL CLINICAL IMPRESSION(S) /  ED DIAGNOSES   Final diagnoses:  Acute pancreatitis, unspecified complication status, unspecified pancreatitis type  Chronic congestive heart failure, unspecified heart failure type (HCC)  Type 2 diabetes mellitus without complication, with long-term current use of insulin (HCC)  Chronic obstructive pulmonary disease, unspecified COPD type ([place])  Morbid obesity ([place])     Rx / DC Orders   ED Discharge Orders     None        Note:  This document was prepared using Dragon voice recognition software and may include unintentional dictation errors.   Carrie Mew, MD 10/03/21 [phone]

## 2021-10-03 NOTE — ED Triage Notes (Signed)
Pt comes into the ED via POV c/o upper abd pain, chest pain and headache that started 3 days ago.  Pt also admits to increase fatigue and decreased appetite.  Pt admits to nausea but denies any emesis or diarrhea.  PT currently has even and unlabored respirations and is in NAD.  Pt wears 3L at baseline.

## 2021-10-03 NOTE — ED Notes (Signed)
See triage note   presents with some generalized abd pain and SOB    states pain is in upper abd and into chest

## 2021-10-03 NOTE — Progress Notes (Signed)
PHARMACIST - PHYSICIAN COMMUNICATION  CONCERNING:  Enoxaparin (Lovenox) for DVT Prophylaxis    RECOMMENDATION: Patient was prescribed enoxaprin 40mg  q24 hours for VTE prophylaxis.   Filed Weights   10/03/21 1049  Weight: 130.2 kg (287 lb)    Body mass index is 46.32 kg/m.  Estimated Creatinine Clearance: 82 mL/min (A) (by C-G formula based on SCr of 1.44 mg/dL (H)).   Based on [place] patient is candidate for enoxaparin 0.5mg /kg TBW SQ every 24 hours based on BMI being >30  DESCRIPTION: Pharmacy has adjusted enoxaparin dose per [facility] policy.  Patient is now receiving enoxaparin 0.5mg /kg subcutaneously  every 24 hours    Dallie Piles, PharmD Clinical Pharmacist  10/03/2021 5:20 PM

## 2021-10-03 NOTE — H&P (Signed)
History and Physical    Oscar [name] JOI:[id number] DOB: October 03, 1975 DOA: 10/03/2021  PCP: [facility]  Patient coming from: home   Chief Complaint: abdominal pain  HPI: Oscar [name] is a 46 y.o. male with medical history significant for morbid obesity, dm, non-ischemic dilated cardiomyopathy, hfref, icd in place, osa/ohs, a fib, gout, ckd3, chronic o2, and chronic pancreatitis, who presents with the above.  Cites about 1 week of progressive epigastric abdominal pain with associated nausea. Has had some "dry heaves" no vomiting. No diarrhea, is passing stool/flatus. Is tolerating some fluids. Symptoms have progressed. Symptoms similar to past bouts of pancreatitis. No chest pressure or new dyspnea. No cough or fever.  ED Course:   Imaging shows acute pancreatitis. Given fluids and opioids.   Review of Systems: As per HPI otherwise 10 point review of systems negative.    Past Medical History:  Diagnosis Date   AICD (automatic cardioverter/defibrillator) present    Asthma    Cardiomyopathy ([place])    CHF (congestive heart failure) ([place])    Coronary artery disease    Deafness in right ear    Diabetes mellitus without complication ([place])    Dilated cardiomyopathy ([place])    Dysrhythmia    svt   Failure in dosage    chronic respiratory    GERD (gastroesophageal reflux disease)    Hyperlipidemia    Hypertension    Hypoxemia    Hypoxemia    Mild obesity    Myocardial infarction [facility])    [id number]/62   Pancreatitis    Sleep apnea    osa    Past Surgical History:  Procedure Laterality Date   CARDIAC CATHETERIZATION  12/11/2014   Procedure: RIGHT/LEFT HEART CATH AND CORONARY ANGIOGRAPHY;  Surgeon: Lorretta Harp, MD;  Location: [facility] CATH LAB;  Service: Cardiovascular;;   ICD LEAD REMOVAL N/A 03/30/2015   Procedure: ICD LEAD REMOVAL;  Surgeon: Marzetta Board, MD;  Location: [facility] ORS;  Service: Cardiovascular;  Laterality: N/A;   IMPLANTABLE  CARDIOVERTER DEFIBRILLATOR IMPLANT     INCISION AND DRAINAGE ABSCESS N/A 12/12/2020   Procedure: INCISION AND DRAINAGE ABSCESS;  Surgeon: Jules Husbands, MD;  Location: [facility] ORS;  Service: General;  Laterality: N/A;   INSERT / REPLACE / [place] N/A 12/09/2014   Procedure: LEFT HEART CATHETERIZATION WITH CORONARY ANGIOGRAM;  Surgeon: Burnell Blanks, MD;  Location: [facility] CATH LAB;  Service: Cardiovascular;  Laterality: N/A;     reports that he has been smoking cigarettes. He has a 12.00 pack-year smoking history. He has never used smokeless tobacco. He reports that he does not drink alcohol and does not use drugs.  Allergies  Allergen Reactions   Ciprofloxacin Other (See Comments) and Swelling    Migraine Headache Migraine Headache   Iodinated Contrast Media     Other reaction(s): Vomiting Projectile vomiting   Isosorb Dinitrate-Hydralazine Other (See Comments)    Migraine Headache Migraine Headache Other reaction(s): Other (See Comments) Migraine Headache Other reaction(s): Other (See Comments) Migraine Headache Migraine Headache Migraine Headache Migraine Headache Migraine Headache Migraine Headache    Family History  Problem Relation Age of Onset   Hypertension Mother    Congestive Heart Failure Mother    Hypertension Sister    Diabetes Sister    Pancreatitis Sister    COPD Sister    Pancreatitis Brother    Anemia Neg Hx    Arrhythmia Neg Hx  Asthma Neg Hx    Clotting disorder Neg Hx    Fainting Neg Hx    Heart attack Neg Hx    Heart disease Neg Hx    Heart failure Neg Hx    Hyperlipidemia Neg Hx     Prior to Admission medications   Medication Sig Start Date End Date Taking? Authorizing Provider  allopurinol (ZYLOPRIM) 100 MG tablet Take by mouth. 11/09/18   [provider]  amiodarone (PACERONE) 200 MG tablet Take 200 mg by mouth 2 (two) times daily. Patient not taking: Reported on  03/13/2021 11/15/20 03/13/21  [provider]  atorvastatin (LIPITOR) 40 MG tablet Take 1 tablet (40 mg total) by mouth daily at 6 PM. 11/04/18   Alisa Graff, FNP  carvedilol (COREG) 3.125 MG tablet Take 1 tablet (3.125 mg total) by mouth 2 (two) times daily with a meal. 11/25/20   Sharen Hones, MD  clindamycin (CLEOCIN-T) 1 % lotion Apply to affected areas daily after shower. 06/11/21   [name], MD  colchicine 0.6 MG tablet Take 1 tablet (0.6 mg total) by mouth daily. 09/05/21   Felipa Furnace, DPM  empagliflozin (JARDIANCE) 10 MG TABS tablet Take 10 mg by mouth daily.    [provider]  hyoscyamine (LEVSIN SL) 0.125 MG SL tablet Take by mouth every 6 (six) hours as needed. 08/07/21   [provider]  insulin detemir (LEVEMIR) 100 UNIT/ML injection Inject 15 Units into the skin 2 (two) times daily.    [provider]  INVOKANA 100 MG TABS tablet Take 100 mg by mouth daily. 09/04/21   [provider]  ketoconazole (NIZORAL) 2 % cream Apply twice daily to neck until clear 06/11/21   [name], MD  oxyCODONE (OXY IR/ROXICODONE) 5 MG immediate release tablet Take 1 tablet (5 mg total) by mouth every 6 (six) hours as needed for severe pain or breakthrough pain (use 30 minutes prior to planned dressing changes). 12/13/20   Tylene Fantasia, PA-C  pantoprazole (PROTONIX) 40 MG tablet Take by mouth. 08/22/21 08/22/22  [provider]  rivaroxaban (XARELTO) 20 MG TABS tablet Take 20 mg by mouth daily with supper. Patient not taking: Reported on 03/13/2021    [provider]  sacubitril-valsartan (ENTRESTO) 49-51 MG Take 1 tablet by mouth 2 (two) times daily.    [provider]  tazarotene (AVAGE) 0.1 % cream Apply topically at bedtime. 06/11/21   [name], MD  tiotropium (SPIRIVA) 18 MCG inhalation capsule Place 18 mcg into inhaler and inhale daily.    [provider]  torsemide 40 MG TABS Take 40 mg by mouth 2 (two)  times daily. Patient taking differently: Take 40 mg by mouth in the morning and at bedtime. 11/27/20   Lorella Nimrod, MD  umeclidinium-vilanterol (ANORO ELLIPTA) 62.5-25 MCG/INH AEPB Inhale 1 puff into the lungs daily in the afternoon. 01/31/19   [provider]  VENTOLIN HFA 108 (90 Base) MCG/ACT inhaler Inhale into the lungs. 11/23/20   [provider]    Physical Exam: Vitals:   10/03/21 1159 10/03/21 1211 10/03/21 1233 10/03/21 1441  BP: 107/68 97/71 101/73 107/70  Pulse: (!) 103 78 88 78  Resp:  16 18 16   Temp:      TempSrc:      SpO2: 98% 98% 97% 97%  Weight:      Height:        Constitutional: No acute distress Head: Atraumatic Eyes: Conjunctiva clear ENM: Moist mucous membranes. Normal  dentition.  Neck: Supple Respiratory: Clear to auscultation bilaterally, no wheezing/rales/rhonchi. Normal respiratory effort. No accessory muscle use. . Cardiovascular: Regular rate and rhythm. No murmurs/rubs/gallops. Abdomen: Non-tender, non-distended. No masses. No rebound or guarding. Positive bowel sounds. Musculoskeletal: No joint deformity upper and lower extremities. Normal ROM, no contractures. Normal muscle tone.  Skin: No rashes, lesions, or ulcers.  Extremities: No peripheral edema. Palpable peripheral pulses. Neurologic: Alert, moving all 4 extremities. Psychiatric: Normal insight and judgement.   Labs on Admission: I have personally reviewed following labs and imaging studies  CBC: Recent Labs  Lab 10/03/21 1051  WBC 3.2*  HGB 15.7  HCT 45.5  MCV 93.8  PLT 254*   Basic Metabolic Panel: Recent Labs  Lab 10/03/21 1051  NA 132*  K 4.0  CL 98  CO2 26  GLUCOSE 156*  BUN 33*  CREATININE 1.44*  CALCIUM 9.3   GFR: Estimated Creatinine Clearance: 82 mL/min (A) (by C-G formula based on SCr of 1.44 mg/dL (H)). Liver Function Tests: Recent Labs  Lab 10/03/21 1051  AST 25  ALT 28  ALKPHOS 156*  BILITOT 0.5  PROT 8.1  ALBUMIN 3.8   Recent  Labs  Lab 10/03/21 1051  LIPASE 65*   No results for input(s): AMMONIA in the last 168 hours. Coagulation Profile: No results for input(s): INR, PROTIME in the last 168 hours. Cardiac Enzymes: No results for input(s): CKTOTAL, CKMB, CKMBINDEX, TROPONINI in the last 168 hours. BNP (last 3 results) No results for input(s): PROBNP in the last 8760 hours. HbA1C: No results for input(s): HGBA1C in the last 72 hours. CBG: No results for input(s): GLUCAP in the last 168 hours. Lipid Profile: Recent Labs    10/03/21 1234  TRIG 116   Thyroid Function Tests: No results for input(s): TSH, T4TOTAL, FREET4, T3FREE, THYROIDAB in the last 72 hours. Anemia Panel: No results for input(s): VITAMINB12, FOLATE, FERRITIN, TIBC, IRON, RETICCTPCT in the last 72 hours. Urine analysis:    Component Value Date/Time   COLORURINE YELLOW (A) 05/03/2021 1223   APPEARANCEUR CLEAR 05/03/2021 1223   APPEARANCEUR Hazy 08/21/2014 0800   LABSPEC 1.015 05/03/2021 1223   LABSPEC 1.015 08/21/2014 0800   PHURINE 5.0 05/03/2021 1223   GLUCOSEU 500 (A) 05/03/2021 1223   GLUCOSEU Negative 08/21/2014 0800   HGBUR NEGATIVE 05/03/2021 1223   BILIRUBINUR SMALL (A) 05/03/2021 1223   BILIRUBINUR Negative 08/21/2014 0800   KETONESUR 15 (A) 05/03/2021 1223   PROTEINUR 100 (A) 05/03/2021 1223   NITRITE NEGATIVE 05/03/2021 1223   LEUKOCYTESUR NEGATIVE 05/03/2021 1223   LEUKOCYTESUR 1+ 08/21/2014 0800    Radiological Exams on Admission: CT ABDOMEN PELVIS WO CONTRAST  Result Date: 10/03/2021 CLINICAL DATA:  Nausea/vomiting Bowel obstruction suspected Pancreatitis, acute, severe EXAM: CT ABDOMEN AND PELVIS WITHOUT CONTRAST TECHNIQUE: Multidetector CT imaging of the abdomen and pelvis was performed following the standard protocol without IV contrast. RADIATION DOSE REDUCTION: This exam was performed according to the departmental dose-optimization program which includes automated exposure control, adjustment of the mA and/or  kV according to patient size and/or use of iterative reconstruction technique. COMPARISON:  CT abdomen pelvis 05/03/2021 FINDINGS: Lower chest: Mildly enlarged heart, unchanged. No acute abnormality. Hepatobiliary: No focal liver abnormality is seen. No gallstones, gallbladder wall thickening, or biliary dilatation. Pancreas: There is stranding along the pancreatic head and body. No focal fluid collection. No pancreatic ductal dilation. No visible ductal stones. Spleen: Normal in size without focal abnormality. Adrenals/Urinary Tract: Adrenal glands are unremarkable. No hydronephrosis or nephrolithiasis. Bladder is  unremarkable. Stomach/Bowel: Tiny hiatal hernia. The stomach is otherwise within normal limits. There is no evidence of bowel obstruction. The appendix is normal. Vascular/Lymphatic: Aortoiliac atherosclerotic calcifications. No AAA. Mildly prominent upper abdominal lymph nodes, likely reactive. Reproductive: Unremarkable. Other: Fat containing umbilical hernia. No bowel containing hernia. No abdominopelvic ascites. No free air. No focal fluid collection. Musculoskeletal: No acute osseous abnormality. No suspicious lytic or blastic lesions. Multilevel degenerative changes of the spine. IMPRESSION: Acute pancreatitis.  No focal fluid collection. Chronic findings as described above. Electronically Signed   By: Maurine Simmering M.D.   On: 10/03/2021 13:30   DG Chest 2 View  Result Date: 10/03/2021 CLINICAL DATA:  Chest pain. EXAM: CHEST - 2 VIEW COMPARISON:  November 25, 2020. FINDINGS: Enlargement of the cardiac silhouette. Left subclavian approach cardiac rhythm maintenance device with 2 leads. No consolidation. No visible pleural effusions or pneumothorax. Mild height loss of multiple thoracic vertebral bodies, unchanged. IMPRESSION: Cardiomegaly and pulmonary vascular congestion without overt pulmonary edema. Electronically Signed   By: Margaretha Sheffield M.D.   On: 10/03/2021 11:16    EKG: Independently  reviewed. Nsr with frequent pvs, stable LBB  Assessment/Plan Active Problems:   NSTEMI (non-ST elevated myocardial infarction) ([place])   Hypertension   Chronic respiratory failure with hypercapnia (HCC)   Non-ischemic cardiomyopathy (HCC)   Chronic systolic heart failure (HCC)   Diabetes ([place])   Obstructive sleep apnea   Acute pancreatitis   Obesity, Class III, BMI 40-49.9 (morbid obesity) ([place])   # Acute pancreatitis History of recurrent pancreatitis. Currently appears mild, CT w/o signs stone or necrosis or fluid collection. Triglycerides normal. Followed by GI as outpt, current plan consideration of elective cholecystectomy at [facility], has appt with that GI surgeon scheduled. - NPO - gentle fluids @ 125 given hx CHF - pain control - monitor  # T2DM Diet controlled, here glucose mildly elevated - SSI  # Chronic hypoxic respiratory failure Breathing comfortably on home 3 L - cont [place] O2  # HFrEF # Non-ischemic cardiomyopathy Patient says only current cardiac meds are entresto, invokana, and torsemide. Followed by cardiology. Has ICD - cont entresto and torsemide, holding invokana while inpatient  # paroxysmal a-fib Patient says no longer anticoagulated or on rate/rhythm control - outpt cardiology f/u - tele while inpatient  # OSA - cont cpap qhs  # Gout Recent flare resolved. Not on home meds - monitor    DVT prophylaxis: lovenox Code Status: full  Family Communication: none @ bedside  Consults called: none   Level of care: Telemetry Medical Status is: Inpatient Remains inpatient appropriate because: severity of illness     Desma Maxim MD [facility] Pager [phone]  If 7PM-7AM, please contact night-coverage [email or web address] Password [facility]  10/03/2021, 3:55 PM

## 2021-10-04 ENCOUNTER — Ambulatory Visit: Payer: Medicare Other | Admitting: Family

## 2021-10-04 DIAGNOSIS — K859 Acute pancreatitis without necrosis or infection, unspecified: Secondary | ICD-10-CM | POA: Diagnosis not present

## 2021-10-04 LAB — GLUCOSE, CAPILLARY
Glucose-Capillary: 100 mg/dL — ABNORMAL HIGH (ref 70–99)
Glucose-Capillary: 106 mg/dL — ABNORMAL HIGH (ref 70–99)
Glucose-Capillary: 109 mg/dL — ABNORMAL HIGH (ref 70–99)
Glucose-Capillary: 115 mg/dL — ABNORMAL HIGH (ref 70–99)
Glucose-Capillary: 130 mg/dL — ABNORMAL HIGH (ref 70–99)

## 2021-10-04 LAB — COMPREHENSIVE METABOLIC PANEL
ALT: 23 U/L (ref 0–44)
AST: 20 U/L (ref 15–41)
Albumin: 3.5 g/dL (ref 3.5–5.0)
Alkaline Phosphatase: 132 U/L — ABNORMAL HIGH (ref 38–126)
Anion gap: 7 (ref 5–15)
BUN: 30 mg/dL — ABNORMAL HIGH (ref 6–20)
CO2: 28 mmol/L (ref 22–32)
Calcium: 9 mg/dL (ref 8.9–10.3)
Chloride: 101 mmol/L (ref 98–111)
Creatinine, Ser: 1.5 mg/dL — ABNORMAL HIGH (ref 0.61–1.24)
GFR, Estimated: 58 mL/min — ABNORMAL LOW (ref >60)
Glucose, Bld: 104 mg/dL — ABNORMAL HIGH (ref 70–99)
Potassium: 4 mmol/L (ref 3.5–5.1)
Sodium: 136 mmol/L (ref 135–145)
Total Bilirubin: 0.6 mg/dL (ref 0.3–1.2)
Total Protein: 7.4 g/dL (ref 6.5–8.1)

## 2021-10-04 LAB — HIV ANTIBODY (ROUTINE TESTING W REFLEX): HIV Screen 4th Generation wRfx: NONREACTIVE

## 2021-10-04 LAB — PATHOLOGIST SMEAR REVIEW

## 2021-10-04 LAB — CBC
HCT: 42.1 % (ref 39.0–52.0)
Hemoglobin: 14.3 g/dL (ref 13.0–17.0)
MCH: 32.6 pg (ref 26.0–34.0)
MCHC: 34 g/dL (ref 30.0–36.0)
MCV: 95.9 fL (ref 80.0–100.0)
Platelets: 120 10*3/uL — ABNORMAL LOW (ref 150–400)
RBC: 4.39 MIL/uL (ref 4.22–5.81)
RDW: 11.4 % — ABNORMAL LOW (ref 11.5–15.5)
WBC: 3 10*3/uL — ABNORMAL LOW (ref 4.0–10.5)
nRBC: 0 % (ref 0.0–0.2)

## 2021-10-04 LAB — SAVE SMEAR(SSMR), FOR PROVIDER SLIDE REVIEW

## 2021-10-04 LAB — HEPATITIS C ANTIBODY: HCV Ab: NONREACTIVE

## 2021-10-04 MED ORDER — ADULT MULTIVITAMIN W/MINERALS CH
1.0000 | ORAL_TABLET | Freq: Every day | ORAL | Status: DC
  Administered 2021-10-04 – 2021-10-05 (×2): 1 via ORAL
  Filled 2021-10-04 (×2): qty 1

## 2021-10-04 MED ORDER — LACTATED RINGERS IV SOLN: INTRAVENOUS | Status: DC

## 2021-10-04 MED ORDER — BOOST / RESOURCE BREEZE PO LIQD CUSTOM
1.0000 | Freq: Three times a day (TID) | ORAL | Status: DC
  Administered 2021-10-05: 1 via ORAL

## 2021-10-04 NOTE — Progress Notes (Addendum)
PROGRESS NOTE    Oscar [name]  YBO:[id number] DOB: 05-08-1976 DOA: 10/03/2021 PCP: [facility] [place]  Outpatient Specialists: GI, cardiology    Brief Narrative:  From admission h and p Oscar [name] is a 46 y.o. male with medical history significant for morbid obesity, dm, non-ischemic dilated cardiomyopathy, hfref, icd in place, osa/ohs, a fib, gout, ckd3, chronic o2, and chronic pancreatitis, who presents with the above.   Cites about 1 week of progressive epigastric abdominal pain with associated nausea. Has had some "dry heaves" no vomiting. No diarrhea, is passing stool/flatus. Is tolerating some fluids. Symptoms have progressed. Symptoms similar to past bouts of pancreatitis. No chest pressure or new dyspnea. No cough or fever.   Assessment & Plan:   Active Problems:   NSTEMI (non-ST elevated myocardial infarction) ([place])   Hypertension   Chronic respiratory failure with hypercapnia (HCC)   Non-ischemic cardiomyopathy (HCC)   Chronic systolic heart failure (HCC)   Diabetes ([place])   Obstructive sleep apnea   Acute pancreatitis   Obesity, Class III, BMI 40-49.9 (morbid obesity) ([place])   # Acute pancreatitis History of recurrent pancreatitis. Currently appears mild, CT w/o signs stone or necrosis or fluid collection. Triglycerides normal. Followed by GI as outpt, current plan consideration of elective cholecystectomy at [facility], has appt with that GI surgeon scheduled. Symptomatically somewhat improved from admission - clear liquid diet this morning, advance to full liquids this afternoon if tolerates - gentle fluids @ 125 given hx CHF - pain control - monitor  # Thrombocytopenia, leukopenia Mild, possibly 2/2 acute illness - f/u hiv, hcv, smear - monitor   # T2DM Diet controlled, here glucose mildly elevated - SSI   # Chronic hypoxic respiratory failure Breathing comfortably on home 3 L - cont [place] O2   # HFrEF # Non-ischemic  cardiomyopathy Patient says only current cardiac meds are entresto, invokana, and torsemide. Followed by cardiology. Has ICD - cont entresto, holding invokana and torsemide while inpatient and giving fluids   # paroxysmal a-fib Patient says no longer anticoagulated or on rate/rhythm control - outpt cardiology f/u - tele while inpatient   # OSA - cont cpap qhs   # Gout Recent flare resolved. Not on home meds - monitor     DVT prophylaxis: lovenox Code Status: full Family Communication: none @ bedside  Level of care: Telemetry Medical Status is: Inpatient Remains inpatient appropriate because: severity of illness            Consultants:  none  Procedures: none  Antimicrobials:  none    Subjective: This morning abd pain improving. No vomiting. Passing flatus. No fevers  Objective: Vitals:   10/03/21 2005 10/04/21 0052 10/04/21 0337 10/04/21 0744  BP: (!) 83/57  90/67 99/68  Pulse: 61 (!) 59 65 60  Resp: 16  18 17   Temp: 97.7 F (36.5 C)  97.7 F (36.5 C) 98.7 F (37.1 C)  TempSrc:      SpO2: 99% 97% 97% 100%  Weight:      Height:        Intake/Output Summary (Last 24 hours) at 10/04/2021 1020 Last data filed at 10/04/2021 0601 Gross per 24 hour  Intake 1188.05 ml  Output 775 ml  Net 413.05 ml   Filed Weights   10/03/21 1049  Weight: 130.2 kg    Examination:  Constitutional: No acute distress Head: Atraumatic Respiratory: Clear to auscultation bilaterally, no wheezing/rales/rhonchi. Normal respiratory effort. No accessory muscle use. . Cardiovascular: Regular rate and rhythm.  No murmurs/rubs/gallops. Abdomen: mild ttp epigastrum. No masses. No rebound or guarding. Positive bowel sounds. Musculoskeletal: No joint deformity upper and lower extremities. Normal ROM, no contractures. Normal muscle tone.  Skin: No rashes, lesions, or ulcers.  Extremities: trace peripheral edema. Palpable peripheral pulses. Neurologic: Alert, moving all 4  extremities. Psychiatric: Normal insight and judgement.    Data Reviewed: I have personally reviewed following labs and imaging studies  CBC: Recent Labs  Lab 10/03/21 1051 10/04/21 0348  WBC 3.2* 3.0*  HGB 15.7 14.3  HCT 45.5 42.1  MCV 93.8 95.9  PLT 146* 426*   Basic Metabolic Panel: Recent Labs  Lab 10/03/21 1051 10/04/21 0348  NA 132* 136  K 4.0 4.0  CL 98 101  CO2 26 28  GLUCOSE 156* 104*  BUN 33* 30*  CREATININE 1.44* 1.50*  CALCIUM 9.3 9.0   GFR: Estimated Creatinine Clearance: 78.7 mL/min (A) (by C-G formula based on SCr of 1.5 mg/dL (H)). Liver Function Tests: Recent Labs  Lab 10/03/21 1051 10/04/21 0348  AST 25 20  ALT 28 23  ALKPHOS 156* 132*  BILITOT 0.5 0.6  PROT 8.1 7.4  ALBUMIN 3.8 3.5   Recent Labs  Lab 10/03/21 1051  LIPASE 65*   No results for input(s): AMMONIA in the last 168 hours. Coagulation Profile: No results for input(s): INR, PROTIME in the last 168 hours. Cardiac Enzymes: No results for input(s): CKTOTAL, CKMB, CKMBINDEX, TROPONINI in the last 168 hours. BNP (last 3 results) No results for input(s): PROBNP in the last 8760 hours. HbA1C: No results for input(s): HGBA1C in the last 72 hours. CBG: Recent Labs  Lab 10/03/21 1721 10/03/21 2011 10/04/21 0012 10/04/21 0745  GLUCAP 133* 118* 106* 115*   Lipid Profile: Recent Labs    10/03/21 1234  TRIG 116   Thyroid Function Tests: No results for input(s): TSH, T4TOTAL, FREET4, T3FREE, THYROIDAB in the last 72 hours. Anemia Panel: No results for input(s): VITAMINB12, FOLATE, FERRITIN, TIBC, IRON, RETICCTPCT in the last 72 hours. Urine analysis:    Component Value Date/Time   COLORURINE YELLOW (A) 05/03/2021 1223   APPEARANCEUR CLEAR 05/03/2021 1223   APPEARANCEUR Hazy 08/21/2014 0800   LABSPEC 1.015 05/03/2021 1223   LABSPEC 1.015 08/21/2014 0800   PHURINE 5.0 05/03/2021 1223   GLUCOSEU 500 (A) 05/03/2021 1223   GLUCOSEU Negative 08/21/2014 0800   HGBUR  NEGATIVE 05/03/2021 1223   BILIRUBINUR SMALL (A) 05/03/2021 1223   BILIRUBINUR Negative 08/21/2014 0800   KETONESUR 15 (A) 05/03/2021 1223   PROTEINUR 100 (A) 05/03/2021 1223   NITRITE NEGATIVE 05/03/2021 1223   LEUKOCYTESUR NEGATIVE 05/03/2021 1223   LEUKOCYTESUR 1+ 08/21/2014 0800   Sepsis Labs: @LABRCNTIP (procalcitonin:4,lacticidven:4)  ) Recent Results (from the past 240 hour(s))  Resp Panel by RT-PCR (Flu A&B, Covid) Nasopharyngeal Swab     Status: None   Collection Time: 10/03/21 12:09 PM   Specimen: Nasopharyngeal Swab; Nasopharyngeal(NP) swabs in vial transport medium  Result Value Ref Range Status   SARS Coronavirus 2 by RT PCR NEGATIVE NEGATIVE Final    Comment: (NOTE) SARS-CoV-2 target nucleic acids are NOT DETECTED.  The SARS-CoV-2 RNA is generally detectable in upper respiratory specimens during the acute phase of infection. The lowest concentration of SARS-CoV-2 viral copies this assay can detect is 138 copies/mL. A negative result does not preclude SARS-Cov-2 infection and should not be used as the sole basis for treatment or other patient management decisions. A negative result may occur with  improper specimen collection/handling, submission of specimen  other than nasopharyngeal swab, presence of viral mutation(s) within the areas targeted by this assay, and inadequate number of viral copies(<138 copies/mL). A negative result must be combined with clinical observations, patient history, and epidemiological information. The expected result is Negative.  Fact Sheet for Patients:  EntrepreneurPulse.com.au  Fact Sheet for Healthcare Providers:  IncredibleEmployment.be  This test is no t yet approved or cleared by the [place] FDA and  has been authorized for detection and/or diagnosis of SARS-CoV-2 by FDA under an Emergency Use Authorization (EUA). This EUA will remain  in effect (meaning this test can be used) for the  duration of the COVID-19 declaration under Section 564(b)(1) of the Act, 21 U.S.C.section 360bbb-3(b)(1), unless the authorization is terminated  or revoked sooner.       Influenza A by PCR NEGATIVE NEGATIVE Final   Influenza B by PCR NEGATIVE NEGATIVE Final    Comment: (NOTE) The Xpert Xpress SARS-CoV-2/FLU/RSV plus assay is intended as an aid in the diagnosis of influenza from Nasopharyngeal swab specimens and should not be used as a sole basis for treatment. Nasal washings and aspirates are unacceptable for Xpert Xpress SARS-CoV-2/FLU/RSV testing.  Fact Sheet for Patients: EntrepreneurPulse.com.au  Fact Sheet for Healthcare Providers: IncredibleEmployment.be  This test is not yet approved or cleared by the [place] FDA and has been authorized for detection and/or diagnosis of SARS-CoV-2 by FDA under an Emergency Use Authorization (EUA). This EUA will remain in effect (meaning this test can be used) for the duration of the COVID-19 declaration under Section 564(b)(1) of the Act, 21 U.S.C. section 360bbb-3(b)(1), unless the authorization is terminated or revoked.  Performed at [facility], [address] [place], [place] [address]          Radiology Studies: CT ABDOMEN PELVIS WO CONTRAST  Result Date: 10/03/2021 CLINICAL DATA:  Nausea/vomiting Bowel obstruction suspected Pancreatitis, acute, severe EXAM: CT ABDOMEN AND PELVIS WITHOUT CONTRAST TECHNIQUE: Multidetector CT imaging of the abdomen and pelvis was performed following the standard protocol without IV contrast. RADIATION DOSE REDUCTION: This exam was performed according to the departmental dose-optimization program which includes automated exposure control, adjustment of the mA and/or kV according to patient size and/or use of iterative reconstruction technique. COMPARISON:  CT abdomen pelvis 05/03/2021 FINDINGS: Lower chest: Mildly enlarged heart, unchanged. No  acute abnormality. Hepatobiliary: No focal liver abnormality is seen. No gallstones, gallbladder wall thickening, or biliary dilatation. Pancreas: There is stranding along the pancreatic head and body. No focal fluid collection. No pancreatic ductal dilation. No visible ductal stones. Spleen: Normal in size without focal abnormality. Adrenals/Urinary Tract: Adrenal glands are unremarkable. No hydronephrosis or nephrolithiasis. Bladder is unremarkable. Stomach/Bowel: Tiny hiatal hernia. The stomach is otherwise within normal limits. There is no evidence of bowel obstruction. The appendix is normal. Vascular/Lymphatic: Aortoiliac atherosclerotic calcifications. No AAA. Mildly prominent upper abdominal lymph nodes, likely reactive. Reproductive: Unremarkable. Other: Fat containing umbilical hernia. No bowel containing hernia. No abdominopelvic ascites. No free air. No focal fluid collection. Musculoskeletal: No acute osseous abnormality. No suspicious lytic or blastic lesions. Multilevel degenerative changes of the spine. IMPRESSION: Acute pancreatitis.  No focal fluid collection. Chronic findings as described above. Electronically Signed   By: Maurine Simmering M.D.   On: 10/03/2021 13:30   DG Chest 2 View  Result Date: 10/03/2021 CLINICAL DATA:  Chest pain. EXAM: CHEST - 2 VIEW COMPARISON:  November 25, 2020. FINDINGS: Enlargement of the cardiac silhouette. Left subclavian approach cardiac rhythm maintenance device with 2 leads. No consolidation. No  visible pleural effusions or pneumothorax. Mild height loss of multiple thoracic vertebral bodies, unchanged. IMPRESSION: Cardiomegaly and pulmonary vascular congestion without overt pulmonary edema. Electronically Signed   By: Margaretha Sheffield M.D.   On: 10/03/2021 11:16        Scheduled Meds:  enoxaparin (LOVENOX) injection  0.5 mg/kg Subcutaneous Q24H   insulin aspart  0-15 Units Subcutaneous TID WC   pantoprazole  40 mg Oral Daily   sacubitril-valsartan  1 tablet  Oral BID   Continuous Infusions:   LOS: 1 day    Time spent: 30 min    Desma Maxim, MD [facility]   If 7PM-7AM, please contact night-coverage [email or web address] Password [email or web address] 10/04/2021, 10:20 AM

## 2021-10-04 NOTE — Progress Notes (Signed)
Initial Nutrition Assessment  DOCUMENTATION CODES:  Morbid obesity  INTERVENTION:  Advance diet as tolerated and as medically able.  Add Boost Breeze po TID, each supplement provides 250 kcal and 9 grams of protein.  Add MVI with minerals daily.  Encourage PO and supplement intake.  NUTRITION DIAGNOSIS:  Increased nutrient needs related to acute illness (pancreatitis flare) as evidenced by estimated needs.  GOAL:  Patient will meet greater than or equal to 90% of their needs  MONITOR:  Diet advancement, PO intake, Supplement acceptance, Labs, Weight trends, I & O's  REASON FOR ASSESSMENT:  Malnutrition Screening Tool    ASSESSMENT:  46 yo male with a PMH of T2DM, non-ischemic dilated cardiomyopathy, HFrEF, ICD in place, OSA/OHS, Afib, gout, CKD stage 3, use of chronic oxygen, and chronic pancreatitis. Admitted with acute pancreatitis.  Spoke with pt at bedside. Pt reports that he will drink Ensure at home when his stomach bothers him, but he does not like them a lot. He also reports that he normally has a good appetite until he has a flare of pancreatitis.  He reports losing weight from 327 lbs to 280 in the past week. Per Epic, pt has lost ~14.5 lbs (4.8%) in the past 5 months, which is not necessarily significant for the time frame.  Pt not interested in drinking Ensure at this time, but he is willing to drink Boost Breeze for extra calories and protein.  Medications: reviewed; SSI, Protonix, LR @ 100 ml/hr  Labs: reviewed; CBG 106-118 (H) HbA1c: 7.8% (11/24/2020)  NUTRITION - FOCUSED PHYSICAL EXAM: Flowsheet Row Most Recent Value  Orbital Region No depletion  Upper Arm Region No depletion  Thoracic and Lumbar Region No depletion  Buccal Region No depletion  Temple Region No depletion  Clavicle Bone Region No depletion  Clavicle and Acromion Bone Region No depletion  Scapular Bone Region No depletion  Dorsal Hand No depletion  Patellar Region No depletion   Anterior Thigh Region No depletion  Posterior Calf Region No depletion  Edema (RD Assessment) None  Hair Reviewed  Eyes Reviewed  Mouth Reviewed  Skin Reviewed  Nails Reviewed   Diet Order:   Diet Order             Diet clear liquid Room service appropriate? Yes; Fluid consistency: Thin  Diet effective now                  EDUCATION NEEDS:  Education needs have been addressed  Skin:  Skin Assessment: Reviewed RN Assessment  Last BM:  no BM documented  Height:  Ht Readings from Last 1 Encounters:  10/03/21 5\' 6"  (1.676 m)   Weight:  Wt Readings from Last 1 Encounters:  10/03/21 130.2 kg   BMI:  Body mass index is 46.32 kg/m.  Estimated Nutritional Needs:  Kcal:  2200-2400 Protein:  >150 grams Fluid:  >2.2 L  Derrel Nip, RD, LDN (she/her/hers) Clinical Inpatient Dietitian RD Pager/After-Hours/Weekend Pager # in [place]

## 2021-10-04 NOTE — Plan of Care (Signed)
°  Problem: Education: Goal: Knowledge of Pancreatitis treatment and prevention will improve Outcome: Progressing   Problem: Health Behavior/Discharge Planning: Goal: Ability to formulate a plan to maintain an alcohol-free life will improve Outcome: Progressing   Problem: Clinical Measurements: Goal: Complications related to the disease process, condition or treatment will be avoided or minimized Outcome: Progressing   Problem: Education: Goal: Ability to describe self-care measures that may prevent or decrease complications (Diabetes Survival Skills Education) will improve Outcome: Progressing   Problem: Health Behavior/Discharge Planning: Goal: Ability to identify and utilize available resources and services will improve Outcome: Progressing   Problem: Health Behavior/Discharge Planning: Goal: Ability to manage health-related needs will improve Outcome: Progressing   Problem: Clinical Measurements: Goal: Ability to maintain clinical measurements within normal limits will improve Outcome: Progressing   Problem: Health Behavior/Discharge Planning: Goal: Ability to manage health-related needs will improve Outcome: Progressing

## 2021-10-04 NOTE — Evaluation (Signed)
Physical Therapy Evaluation Patient Details Name: Oscar [name] MRN: [id number] DOB: 10-23-1975 Today's Date: 10/04/2021  History of Present Illness  Pt is a 46 y.o. male with medical history significant for morbid obesity, dm, non-ischemic dilated cardiomyopathy, hfref, icd in place, osa/ohs, a fib, gout, ckd3, chronic o2, and chronic pancreatitis, who presents with abdominal pain. MD assessment includes acute pancreatitis, thrombocytopenia, and leukopenia.   Clinical Impression  Pt Ind with all functional tasks including dynamic gait without an AD. Pt ambulated 200 feet with no adverse symptoms on 3LO2/min with SpO2 and HR WNL.  Pt reports feeling at baseline functionally with no skilled PT needs. Will complete PT orders at this time but will reassess pt pending a change in status upon receipt of new PT orders.        Recommendations for follow up therapy are one component of a multi-disciplinary discharge planning process, led by the attending physician.  Recommendations may be updated based on patient status, additional functional criteria and insurance authorization.  Follow Up Recommendations No PT follow up    Assistance Recommended at Discharge None  Patient can return home with the following  Other (comment) (No needs)    Equipment Recommendations None recommended by PT  Recommendations for Other Services       Functional Status Assessment Patient has not had a recent decline in their functional status     Precautions / Restrictions Precautions Precautions: None Restrictions Weight Bearing Restrictions: No      Mobility  Bed Mobility Overal bed mobility: Independent                  Transfers Overall transfer level: Independent                      Ambulation/Gait Ambulation/Gait assistance: Independent Gait Distance (Feet): 200 Feet Assistive device: None Gait Pattern/deviations: WFL(Within Functional Limits) Gait velocity: Mildly  decreased     General Gait Details: Pt ind with amb without an AD including with start/stops and head turns  Financial trader Rankin (Stroke Patients Only)       Balance Overall balance assessment: No apparent balance deficits (not formally assessed)                                           Pertinent Vitals/Pain Pain Assessment Pain Assessment: No/denies pain    Home Living Family/patient expects to be discharged to:: Private residence Living Arrangements: Alone Available Help at Discharge: Available PRN/intermittently;Personal care attendant Type of Home: House Home Access: Ramped entrance       Home Layout: One level Home Equipment: Cane - quad;Cane - single point Additional Comments: Pt has a PCA 2 hrs/day that assists with cleaning    Prior Function Prior Level of Function : Independent/Modified Independent             Mobility Comments: Ind amb without an AD with PRN SPC use with gout flares, no fall history ADLs Comments: Ind with ADLs     Hand Dominance        Extremity/Trunk Assessment   Upper Extremity Assessment Upper Extremity Assessment: Overall WFL for tasks assessed    Lower Extremity Assessment Lower Extremity Assessment: Overall WFL for tasks assessed       Communication   Communication: No  difficulties  Cognition Arousal/Alertness: Awake/alert Behavior During Therapy: WFL for tasks assessed/performed Overall Cognitive Status: Within Functional Limits for tasks assessed                                          General Comments      Exercises     Assessment/Plan    PT Assessment Patient does not need any further PT services  PT Problem List         PT Treatment Interventions      PT Goals (Current goals can be found in the Care Plan section)  Acute Rehab PT Goals PT Goal Formulation: All assessment and education complete, DC therapy     Frequency       Co-evaluation               AM-PAC PT "6 Clicks" Mobility  Outcome Measure Help needed turning from your back to your side while in a flat bed without using bedrails?: None Help needed moving from lying on your back to sitting on the side of a flat bed without using bedrails?: None Help needed moving to and from a bed to a chair (including a wheelchair)?: None Help needed standing up from a chair using your arms (e.g., wheelchair or bedside chair)?: None Help needed to walk in hospital room?: None Help needed climbing 3-5 steps with a railing? : None 6 Click Score: 24    End of Session Equipment Utilized During Treatment: Gait belt;Oxygen Activity Tolerance: Patient tolerated treatment well Patient left: in chair;with call bell/phone within reach Nurse Communication: Mobility status PT Visit Diagnosis: Muscle weakness (generalized) (M62.81)    Time: 0272-5366 PT Time Calculation (min) (ACUTE ONLY): 22 min   Charges:   PT Evaluation $PT Eval Low Complexity: 1 Low         D. Scott Garcia [place] PT, DPT 10/04/21, 11:00 AM

## 2021-10-04 NOTE — TOC Initial Note (Signed)
Transition of Care [facility]) - Initial/Assessment Note    Patient Details  Name: Oscar [name] MRN: [id number] Date of Birth: 1975/10/30  Transition of Care [facility]) CM/SW Contact:    Conception Oms, RN Phone Number: 10/04/2021, 11:14 AM  Clinical Narrative:                     Transition of Care (TOC) Screening Note   Patient Details  Name: Oscar [name] Date of Birth: 02-May-1976   Transition of Care [facility]) CM/SW Contact:    Conception Oms, RN Phone Number: 10/04/2021, 11:14 AM    Transition of Care Department [facility]) has reviewed patient and no TOC needs have been identified at this time. We will continue to monitor patient advancement through interdisciplinary progression rounds. If new patient transition needs arise, please place a TOC consult.       Patient Goals and CMS Choice        Expected Discharge Plan and Services                                                Prior Living Arrangements/Services                       Activities of Daily Living Home Assistive Devices/Equipment: Oxygen, Eyeglasses, CBG Meter, Dentures (specify type) (upper) ADL Screening (condition at time of admission) Patient's cognitive ability adequate to safely complete daily activities?: Yes Is the patient deaf or have difficulty hearing?: No Does the patient have difficulty seeing, even when wearing glasses/contacts?: No Does the patient have difficulty concentrating, remembering, or making decisions?: No Patient able to express need for assistance with ADLs?: Yes Does the patient have difficulty dressing or bathing?: No Independently performs ADLs?: Yes (appropriate for developmental age) Does the patient have difficulty walking or climbing stairs?: No Weakness of Legs: None Weakness of Arms/Hands: None  Permission Sought/Granted                  Emotional Assessment              Admission diagnosis:  Morbid obesity ([place]) [E66.01] Acute  pancreatitis [K85.90] Type 2 diabetes mellitus without complication, with long-term current use of insulin (HCC) [E11.9, Z79.4] Acute pancreatitis, unspecified complication status, unspecified pancreatitis type [K85.90] Chronic obstructive pulmonary disease, unspecified COPD type ([place]) [J44.9] Chronic congestive heart failure, unspecified heart failure type ([place]) [I50.9] Patient Active Problem List   Diagnosis Date Noted   Idiopathic gout, unspecified site 09/26/2021   Implantable cardioverter-defibrillator (ICD) in situ 09/26/2021   Perineal abscess 12/12/2020   Flash pulmonary edema ([place])    Pulmonary edema 11/25/2020   Obesity, Class III, BMI 40-49.9 (morbid obesity) ([place]) 11/24/2020   Acute on chronic diastolic (congestive) heart failure ([place]) 11/24/2020   Acute exacerbation of CHF (congestive heart failure) ([place]) 11/23/2020   Hepatic cirrhosis, unspecified hepatic cirrhosis type, unspecified whether ascites present ([place]) 11/23/2020   Type 2 diabetes mellitus with other diabetic neurological complication ([place]) 44/96/7591   Obesity hypoventilation syndrome ([place]) 11/23/2020   Atrial fibrillation with RVR ([place]) 11/23/2020   ARF (acute renal failure) ([place]) 03/28/2020   Acute on chronic combined systolic (congestive) and diastolic (congestive) heart failure ([place]) 02/27/2020   COPD (chronic obstructive pulmonary disease) ([place]) 02/27/2020   Sprain of foot 01/26/2020   Foreign body granuloma of skin  and subcutaneous tissue 05/26/2019   Acute pancreatitis 05/19/2017   Pancreatitis 09/12/2016   Bronchitis 05/20/2016   Constipation 11/08/2015   Abdominal pain 11/02/2015   Obstructive sleep apnea 07/31/2015   Tobacco use 07/31/2015   Diabetes ([place]) 40/06/2724   Chronic systolic heart failure ([place]) 03/30/2015   Cardiac pacemaker 03/30/2015   Chronic respiratory failure with hypercapnia ([place]) 12/12/2014   Elevated troponin    Non-ischemic cardiomyopathy (HCC)    Hypoxemia    Acute  combined systolic and diastolic heart failure (HCC)    SVT (supraventricular tachycardia) ([place]) 12/09/2014   NSTEMI (non-ST elevated myocardial infarction) [facility]) 12/09/2014   Hypertension 12/09/2014   PCP:  [facility] [place]:   [place] [address] [place] [address] [place]) [place] [address] Phone: [phone] Fax: [phone]     Social Determinants of Health (SDOH) Interventions    Readmission Risk Interventions No flowsheet data found.

## 2021-10-05 DIAGNOSIS — K859 Acute pancreatitis without necrosis or infection, unspecified: Secondary | ICD-10-CM | POA: Diagnosis not present

## 2021-10-05 LAB — COMPREHENSIVE METABOLIC PANEL
ALT: 21 U/L (ref 0–44)
AST: 22 U/L (ref 15–41)
Albumin: 3.4 g/dL — ABNORMAL LOW (ref 3.5–5.0)
Alkaline Phosphatase: 135 U/L — ABNORMAL HIGH (ref 38–126)
Anion gap: 9 (ref 5–15)
BUN: 21 mg/dL — ABNORMAL HIGH (ref 6–20)
CO2: 27 mmol/L (ref 22–32)
Calcium: 8.9 mg/dL (ref 8.9–10.3)
Chloride: 99 mmol/L (ref 98–111)
Creatinine, Ser: 1.14 mg/dL (ref 0.61–1.24)
GFR, Estimated: >60 mL/min (ref >60)
Glucose, Bld: 106 mg/dL — ABNORMAL HIGH (ref 70–99)
Potassium: 3.9 mmol/L (ref 3.5–5.1)
Sodium: 135 mmol/L (ref 135–145)
Total Bilirubin: 0.6 mg/dL (ref 0.3–1.2)
Total Protein: 7.2 g/dL (ref 6.5–8.1)

## 2021-10-05 LAB — CBC WITH DIFFERENTIAL/PLATELET
Abs Immature Granulocytes: 0.01 10*3/uL (ref 0.00–0.07)
Basophils Absolute: 0 10*3/uL (ref 0.0–0.1)
Basophils Relative: 0 %
Eosinophils Absolute: 0.1 10*3/uL (ref 0.0–0.5)
Eosinophils Relative: 3 %
HCT: 40.8 % (ref 39.0–52.0)
Hemoglobin: 14.2 g/dL (ref 13.0–17.0)
Immature Granulocytes: 0 %
Lymphocytes Relative: 49 %
Lymphs Abs: 1.3 10*3/uL (ref 0.7–4.0)
MCH: 32.9 pg (ref 26.0–34.0)
MCHC: 34.8 g/dL (ref 30.0–36.0)
MCV: 94.4 fL (ref 80.0–100.0)
Monocytes Absolute: 0.4 10*3/uL (ref 0.1–1.0)
Monocytes Relative: 14 %
Neutro Abs: 0.9 10*3/uL — ABNORMAL LOW (ref 1.7–7.7)
Neutrophils Relative %: 34 %
Platelets: 123 10*3/uL — ABNORMAL LOW (ref 150–400)
RBC: 4.32 MIL/uL (ref 4.22–5.81)
RDW: 11.3 % — ABNORMAL LOW (ref 11.5–15.5)
Smear Review: NORMAL
WBC: 2.7 10*3/uL — ABNORMAL LOW (ref 4.0–10.5)
nRBC: 0 % (ref 0.0–0.2)

## 2021-10-05 LAB — GLUCOSE, CAPILLARY: Glucose-Capillary: 134 mg/dL — ABNORMAL HIGH (ref 70–99)

## 2021-10-05 MED ORDER — OXYCODONE HCL 5 MG PO TABS: 5.0000 mg | ORAL_TABLET | Freq: Four times a day (QID) | ORAL | 0 refills | Status: DC | PRN

## 2021-10-05 NOTE — Discharge Summary (Signed)
Oscar [name] ZCH:[id number] DOB: [birth year] DOA: 10/03/2021  PCP: [facility] [place] date: 10/03/2021 Discharge date: 10/05/2021  Time spent: 35 minutes  Recommendations for Outpatient Follow-up:  Cardiology, pcp, and [facility] f/u as scheduled Check cbc at f/u. If remains with cytopenia consider heme referral     Discharge Diagnoses:  Active Problems:   NSTEMI (non-ST elevated myocardial infarction) ([place])   Hypertension   Chronic respiratory failure with hypercapnia (HCC)   Non-ischemic cardiomyopathy (HCC)   Chronic systolic heart failure (HCC)   Diabetes ([place])   Obstructive sleep apnea   Acute pancreatitis   Obesity, Class III, BMI 40-49.9 (morbid obesity) ([place])   Discharge Condition: stable  Diet recommendation: heart healthy  Filed Weights   10/03/21 1049  Weight: 130.2 kg    History of present illness:  Oscar [name] is a 46 y.o. male with medical history significant for morbid obesity, dm, non-ischemic dilated cardiomyopathy, hfref, icd in place, osa/ohs, a fib, gout, ckd3, chronic o2, and chronic pancreatitis, who presents with the above.   Cites about 1 week of progressive epigastric abdominal pain with associated nausea. Has had some "dry heaves" no vomiting. No diarrhea, is passing stool/flatus. Is tolerating some fluids. Symptoms have progressed. Symptoms similar to past bouts of pancreatitis. No chest pressure or new dyspnea. No cough or fever.    Hospital Course:  # Acute pancreatitis History of recurrent pancreatitis. Currently appears mild, CT w/o signs stone or necrosis or fluid collection. Triglycerides normal. Followed by GI as outpt, current plan consideration of elective [facility]olecyst[facility] at [facility], has appt with that GI surgeon scheduled. Symptoms resolved with IVF and pain control. Diet advanced and tolerating soft diet day of discharge   # Thrombocytopenia, leukopenia Mild, possibly 2/2 acute illness. Smear  unremarkable, hiv/hcv neg - would repeat CBC in a few weeks, consider hematology referral if cytopenia persists   # T2DM Diet controlled   # Chronic hypoxic respiratory failure Breathing comfortably on home 3 L   # HFrEF # Non-ischemic cardiomyopathy Patient says only current cardiac meds are entresto, invokana, and torsemide. Followed by cardiology. Has ICD. Has close outpt cardiology f/u scheduled   # paroxysmal a-fib Patient says no longer anticoagulated or on rate/rhythm control. No events here. - outpt cardiology f/u   # OSA - continued cpap qhs   # Gout Recent flare resolved.   Procedures: none   Consultations: none  Discharge Exam: Vitals:   10/05/21 0355 10/05/21 0808  BP: 103/74 (!) 102/58  Pulse: 66 63  Resp: 17 16  Temp: 98.4 F (36.9 C) 97.6 F (36.4 C)  SpO2: 100% 98%    Constitutional: No acute distress Head: Atraumatic Respiratory: Clear to auscultation bilaterally, no wheezing/rales/rhonchi. Normal respiratory effort. No accessory muscle use. . Cardiovascular: Regular rate and rhythm. No murmurs/rubs/gallops. Abdomen: mild ttp epigastrum. No masses. No rebound or guarding. Positive bowel sounds. Musculoskeletal: No joint deformity upper and lower extremities. Normal ROM, no contractures. Normal muscle tone.  Skin: No rashes, lesions, or ulcers.  Extremities: trace peripheral edema. Palpable peripheral pulses. Neurologic: Alert, moving all 4 extremities. Psychiatric: Normal insight and judgement.  Discharge Instructions   Discharge Instructions     Diet - low sodium heart healthy   Complete by: As directed    Increase activity slowly   Complete by: As directed       Allergies as of 10/05/2021       Reactions   Ciprofloxacin Other (See Comments), Swelling   Migraine  Headache Migraine Headache   Iodinated Contrast Media    Other reaction(s): Vomiting Projectile vomiting   Isosorb Dinitrate-hydralazine Other (See Comments)   Migraine  Headache Migraine Headache Other reaction(s): Other (See Comments) Migraine Headache Other reaction(s): Other (See Comments) Migraine Headache Migraine Headache Migraine Headache Migraine Headache Migraine Headache Migraine Headache        Medication List     STOP taking these medications    amiodarone 200 MG tablet Commonly known as: PACERONE   carvedilol 3.125 MG tablet Commonly known as: COREG   colchicine 0.6 MG tablet   empagliflozin 10 MG Tabs tablet Commonly known as: JARDIANCE   insulin detemir 100 UNIT/ML injection Commonly known as: LEVEMIR   rivaroxaban 20 MG Tabs tablet Commonly known as: XARELTO   tiotropium 18 MCG inhalation capsule Commonly known as: SPIRIVA       TAKE these medications    allopurinol 100 MG tablet Commonly known as: ZYLOPRIM Take 100 mg by mouth daily.   Anoro Ellipta 62.5-25 MCG/ACT Aepb Generic drug: umeclidinium-vilanterol Inhale 1 puff into the lungs daily in the afternoon.   atorvastatin 40 MG tablet Commonly known as: LIPITOR Take 1 tablet (40 mg total) by mouth daily at 6 PM.   clindamycin 1 % lotion Commonly known as: Cleocin-T Apply to affected areas daily after shower.   Entresto 49-51 MG Generic drug: sacubitril-valsartan Take 1 tablet by mouth 2 (two) times daily.   hyoscyamine 0.125 MG SL tablet Commonly known as: LEVSIN SL Take by mouth every 6 (six) hours as needed.   Invokana 100 MG Tabs tablet Generic drug: canagliflozin Take 100 mg by mouth daily.   ketoconazole 2 % cream Commonly known as: NIZORAL Apply twice daily to neck until clear   oxyCODONE 5 MG immediate release tablet Commonly known as: Oxy IR/ROXICODONE Take 1 tablet (5 mg total) by mouth every 6 (six) hours as needed for severe pain or breakthrough pain (use 30 minutes prior to planned dressing changes).   pantoprazole 40 MG tablet Commonly known as: PROTONIX Take 40 mg by mouth 2 (two) times daily.   tazarotene 0.1 %  cream Commonly known as: AVAGE Apply topically at bedtime.   Torsemide 40 MG Tabs Take 40 mg by mouth 2 (two) times daily. What changed: when to take this   Ventolin HFA 108 (90 Base) MCG/ACT inhaler Generic drug: albuterol Inhale into the lungs.       Allergies  Allergen Reactions   Ciprofloxacin Other (See Comments) and Swelling    Migraine Headache Migraine Headache   Iodinated Contrast Media     Other reaction(s): Vomiting Projectile vomiting   Isosorb Dinitrate-Hydralazine Other (See Comments)    Migraine Headache Migraine Headache Other reaction(s): Other (See Comments) Migraine Headache Other reaction(s): Other (See Comments) Migraine Headache Migraine Headache Migraine Headache Migraine Headache Migraine Headache Migraine Headache      The results of significant diagnostics from this hospitalization (including imaging, microbiology, ancillary and laboratory) are listed below for reference.    Significant Diagnostic Studies: CT ABDOMEN PELVIS WO CONTRAST  Result Date: 10/03/2021 CLINICAL DATA:  Nausea/vomiting Bowel obstruction suspected Pancreatitis, acute, severe EXAM: CT ABDOMEN AND PELVIS WITHOUT CONTRAST TECHNIQUE: Multidetector CT imaging of the abdomen and pelvis was performed following the standard protocol without IV contrast. RADIATION DOSE REDUCTION: This exam was performed according to the departmental dose-optimization program which includes automated exposure control, adjustment of the mA and/or kV according to patient size and/or use of iterative reconstruction technique. COMPARISON:  CT abdomen pelvis  05/03/2021 FINDINGS: Lower chest: Mildly enlarged heart, unchanged. No acute abnormality. Hepatobiliary: No focal liver abnormality is seen. No gallstones, gallbladder wall thickening, or biliary dilatation. Pancreas: There is stranding along the pancreatic head and body. No focal fluid collection. No pancreatic ductal dilation. No visible ductal stones.  Spleen: Normal in size without focal abnormality. Adrenals/Urinary Tract: Adrenal glands are unremarkable. No hydronephrosis or nephrolithiasis. Bladder is unremarkable. Stomach/Bowel: Tiny hiatal hernia. The stomach is otherwise within normal limits. There is no evidence of bowel obstruction. The appendix is normal. Vascular/Lymphatic: Aortoiliac atherosclerotic calcifications. No AAA. Mildly prominent upper abdominal lymph nodes, likely reactive. Reproductive: Unremarkable. Other: Fat containing umbilical hernia. No bowel containing hernia. No abdominopelvic ascites. No free air. No focal fluid collection. Musculoskeletal: No acute osseous abnormality. No suspicious lytic or blastic lesions. Multilevel degenerative changes of the spine. IMPRESSION: Acute pancreatitis.  No focal fluid collection. Chronic findings as described above. Electronically Signed   By: Maurine Simmering M.D.   On: 10/03/2021 13:30   DG Chest 2 View  Result Date: 10/03/2021 CLINICAL DATA:  Chest pain. EXAM: CHEST - 2 VIEW COMPARISON:  November 25, 2020. FINDINGS: Enlargement of the cardiac silhouette. Left subclavian approach cardiac rhythm maintenance device with 2 leads. No consolidation. No visible pleural effusions or pneumothorax. Mild height loss of multiple thoracic vertebral bodies, unchanged. IMPRESSION: Cardiomegaly and pulmonary vascular congestion without overt pulmonary edema. Electronically Signed   By: Margaretha Sheffield M.D.   On: 10/03/2021 11:16    Microbiology: Recent Results (from the past 240 hour(s))  Resp Panel by RT-PCR (Flu A&B, Covid) Nasopharyngeal Swab     Status: None   Collection Time: 10/03/21 12:09 PM   Specimen: Nasopharyngeal Swab; Nasopharyngeal(NP) swabs in vial transport medium  Result Value Ref Range Status   SARS Coronavirus 2 by RT PCR NEGATIVE NEGATIVE Final    Comment: (NOTE) SARS-CoV-2 target nucleic acids are NOT DETECTED.  The SARS-CoV-2 RNA is generally detectable in upper  respiratory specimens during the acute phase of infection. The lowest concentration of SARS-CoV-2 viral copies this assay can detect is 138 copies/mL. A negative result does not preclude SARS-Cov-2 infection and should not be used as the sole basis for treatment or other patient management decisions. A negative result may occur with  improper specimen collection/handling, submission of specimen other than nasopharyngeal swab, presence of viral mutation(s) within the areas targeted by this assay, and inadequate number of viral copies(<138 copies/mL). A negative result must be combined with clinical observations, patient history, and epidemiological information. The expected result is Negative.  Fact Sheet for Patients:  EntrepreneurPulse.com.au  Fact Sheet for Healthcare Providers:  IncredibleEmployment.be  This test is no t yet approved or cleared by the [place] FDA and  has been authorized for detection and/or diagnosis of SARS-CoV-2 by FDA under an Emergency Use Authorization (EUA). This EUA will remain  in effect (meaning this test can be used) for the duration of the COVID-19 declaration under Section 564(b)(1) of the Act, 21 U.S.C.section 360bbb-3(b)(1), unless the authorization is terminated  or revoked sooner.       Influenza A by PCR NEGATIVE NEGATIVE Final   Influenza B by PCR NEGATIVE NEGATIVE Final    Comment: (NOTE) The Xpert Xpress SARS-CoV-2/FLU/RSV plus assay is intended as an aid in the diagnosis of influenza from Nasopharyngeal swab specimens and should not be used as a sole basis for treatment. Nasal washings and aspirates are unacceptable for Xpert Xpress SARS-CoV-2/FLU/RSV testing.  Fact Sheet for Patients: EntrepreneurPulse.com.au  Fact  Sheet for Healthcare Providers: IncredibleEmployment.be  This test is not yet approved or cleared by the [place] and has been  authorized for detection and/or diagnosis of SARS-CoV-2 by FDA under an Emergency Use Authorization (EUA). This EUA will remain in effect (meaning this test can be used) for the duration of the COVID-19 declaration under Section 564(b)(1) of the Act, 21 U.S.C. section 360bbb-3(b)(1), unless the authorization is terminated or revoked.  Performed at [facility], [address] [place], [place] [address]      Labs: Basic Metabolic Panel: Recent Labs  Lab 10/03/21 1051 10/04/21 0348 10/05/21 0443  NA 132* 136 135  K 4.0 4.0 3.9  CL 98 101 99  CO2 26 28 27   GLUCOSE 156* 104* 106*  BUN 33* 30* 21*  CREATININE 1.44* 1.50* 1.14  CALCIUM 9.3 9.0 8.9   Liver Function Tests: Recent Labs  Lab 10/03/21 1051 10/04/21 0348 10/05/21 0443  AST 25 20 22   ALT 28 23 21   ALKPHOS 156* 132* 135*  BILITOT 0.5 0.6 0.6  PROT 8.1 7.4 7.2  ALBUMIN 3.8 3.5 3.4*   Recent Labs  Lab 10/03/21 1051  LIPASE 65*   No results for input(s): AMMONIA in the last 168 hours. CBC: Recent Labs  Lab 10/03/21 1051 10/04/21 0348 10/05/21 0443  WBC 3.2* 3.0* 2.7*  NEUTROABS  --   --  0.9*  HGB 15.7 14.3 14.2  HCT 45.5 42.1 40.8  MCV 93.8 95.9 94.4  PLT 146* 120* 123*   Cardiac Enzymes: No results for input(s): CKTOTAL, CKMB, CKMBINDEX, TROPONINI in the last 168 hours. BNP: BNP (last 3 results) Recent Labs    11/23/20 0806 11/25/20 1958 12/26/20 1107  BNP 1,173.5* 790.9* 136.9*    ProBNP (last 3 results) No results for input(s): PROBNP in the last 8760 hours.  CBG: Recent Labs  Lab 10/04/21 0745 10/04/21 1125 10/04/21 1638 10/04/21 2120 10/05/21 0809  GLUCAP 115* 109* 130* 100* 134*       Signed:  Desma Maxim MD.  [facility] 10/05/2021, 10:29 AM

## 2021-10-05 NOTE — Progress Notes (Signed)
Patient refused both CBG and insulin

## 2021-10-05 NOTE — Progress Notes (Signed)
Patient BP was low, but refused to stay for monitoring or retake.  Patient educated on what signs to look of when to return for medical care.  Patient voiced a desire to continue with discharge.

## 2021-10-05 NOTE — TOC Progression Note (Signed)
Transition of Care [facility]) - Progression Note    Patient Details  Name: Oscar [name] MRN: [id number] Date of Birth: 10-22-1975  Transition of Care [facility]) CM/SW [place], LCSW Phone Number: 10/05/2021, 10:29 AM  Clinical Narrative:      Patient does not have TOC needs.  Pt evaluated patient and did not recommended PT follow-up.      Expected Discharge Plan and Services           Expected Discharge Date: 10/05/21                                     Social Determinants of Health (SDOH) Interventions    Readmission Risk Interventions No flowsheet data found.

## 2021-10-09 ENCOUNTER — Telehealth: Payer: Self-pay

## 2021-10-09 NOTE — Telephone Encounter (Signed)
CALLED PATIENT NO ANSWER LEFT VOICEMAIL FOR A CALL BACK ? ?

## 2021-10-11 ENCOUNTER — Other Ambulatory Visit: Payer: Medicare Other

## 2021-10-14 ENCOUNTER — Other Ambulatory Visit: Payer: Self-pay

## 2021-10-14 ENCOUNTER — Encounter: Payer: Self-pay | Admitting: Gastroenterology

## 2021-10-14 ENCOUNTER — Ambulatory Visit (INDEPENDENT_AMBULATORY_CARE_PROVIDER_SITE_OTHER): Payer: Medicare Other | Admitting: Gastroenterology

## 2021-10-14 VITALS — BP 96/68 | HR 91 | Temp 98.6°F | Ht 66.0 in | Wt 284.8 lb

## 2021-10-14 DIAGNOSIS — Z1211 Encounter for screening for malignant neoplasm of colon: Secondary | ICD-10-CM

## 2021-10-14 DIAGNOSIS — G8929 Other chronic pain: Secondary | ICD-10-CM

## 2021-10-14 DIAGNOSIS — K861 Other chronic pancreatitis: Secondary | ICD-10-CM

## 2021-10-14 DIAGNOSIS — R1013 Epigastric pain: Secondary | ICD-10-CM | POA: Diagnosis not present

## 2021-10-14 MED ORDER — PEG 3350-KCL-NA BICARB-NACL 420 G PO SOLR: 4000.0000 mL | Freq: Once | ORAL | 0 refills | Status: AC

## 2021-10-14 NOTE — Progress Notes (Signed)
Oscar Darby, MD [address]  [place]  [place], [place] [address]  Main: [phone]  Fax: [phone]    Gastroenterology Consultation  Referring Provider:     Center, [place] Physician:  Center, [facility] Primary Gastroenterologist:  Dr. Cephas [name] Reason for Consultation:     Chronic epigastric pain, history of pancreatitis        HPI:   Oscar [name] is a 46 y.o. male referred by Dr. Domingo Madeira, [facility]  for consultation & management of chronic epigastric pain.  Patient recently had a flareup of acute on chronic epigastric pain associated with nausea and vomiting, went to [facility] ER earlier this month, underwent CT abdomen and pelvis without contrast revealed and body.  Pancreatic stranding around the head of the pancreas, serum lipase 65, LFTs revealed alkaline phosphatase 135, normal AST, ALT and total bilirubin.  Patient had elevated GGT in the past.  Patient is found to have recurrent attacks of acute pancreatitis dating back in 2014, his serum lipase levels were in 4000's.  Patient tells me that he was told that he has chronic pancreatitis.  He underwent work-up in the past including MRI, MRCP in 2021 which was unremarkable.  His IgG4 levels were normal.  Serum triglyceride levels are normal.  He does not drink alcohol.  Patient reports that he has chronic epigastric pain and he has lost significant amount of weight due to dietary restriction from pain.  He denies any nausea or vomiting currently.  He denies any abdominal bloating.  He does report irregular bowel habits, takes MiraLAX as needed.  Patient also has large periumbilical hernia and prefers to be removed as well.  Patient had gallbladder sludge based on imaging in the past.  He has history of metabolic syndrome, not well controlled diabetes.  Patient also history of chronic mild thrombocytopenia without known history of cirrhosis.  NSAIDs:  None  Antiplts/Anticoagulants/Anti thrombotics: None  GI Procedures:  EGD 04/26/2020 at [place]. Stomach, endoscopic biopsy:   Gastric antral and oxyntic mucosa with mild chronic gastritis. No active gastritis is seen. Negative for evidence of Helicobacter pylori  Past Medical History:  Diagnosis Date   AICD (automatic cardioverter/defibrillator) present    Asthma    Cardiomyopathy ([place])    CHF (congestive heart failure) ([place])    Coronary artery disease    Deafness in right ear    Diabetes mellitus without complication ([place])    Dilated cardiomyopathy ([place])    Dysrhythmia    svt   Failure in dosage    chronic respiratory    GERD (gastroesophageal reflux disease)    Hyperlipidemia    Hypertension    Hypoxemia    Hypoxemia    Mild obesity    Myocardial infarction [facility])    7026,3785,8/85   Pancreatitis    Sleep apnea    osa    Past Surgical History:  Procedure Laterality Date   CARDIAC CATHETERIZATION  12/11/2014   Procedure: RIGHT/LEFT HEART CATH AND CORONARY ANGIOGRAPHY;  Surgeon: Lorretta Harp, MD;  Location: [facility] CATH LAB;  Service: Cardiovascular;;   ICD LEAD REMOVAL N/A 03/30/2015   Procedure: ICD LEAD REMOVAL;  Surgeon: Marzetta Board, MD;  Location: [facility] ORS;  Service: Cardiovascular;  Laterality: N/A;   IMPLANTABLE CARDIOVERTER DEFIBRILLATOR IMPLANT     INCISION AND DRAINAGE ABSCESS N/A 12/12/2020   Procedure: INCISION AND DRAINAGE ABSCESS;  Surgeon: Jules Husbands, MD;  Location: [facility] ORS;  Service: General;  Laterality: N/A;   INSERT / REPLACE / REMOVE PACEMAKER     LEFT HEART CATHETERIZATION WITH CORONARY ANGIOGRAM N/A 12/09/2014   Procedure: LEFT HEART CATHETERIZATION WITH CORONARY ANGIOGRAM;  Surgeon: Burnell Blanks, MD;  Location: [facility] CATH LAB;  Service: Cardiovascular;  Laterality: N/A;   Current Outpatient Medications:    allopurinol (ZYLOPRIM) 100 MG tablet, Take 100 mg by mouth daily., Disp: , Rfl:    atorvastatin (LIPITOR) 40 MG tablet,  Take 1 tablet (40 mg total) by mouth daily at 6 PM., Disp: 90 tablet, Rfl: 3   clindamycin (CLEOCIN-T) 1 % lotion, Apply to affected areas daily after shower., Disp: 60 mL, Rfl: 3   hyoscyamine (LEVSIN SL) 0.125 MG SL tablet, Take by mouth every 6 (six) hours as needed., Disp: , Rfl:    INVOKANA 100 MG TABS tablet, Take 100 mg by mouth daily., Disp: , Rfl:    ketoconazole (NIZORAL) 2 % cream, Apply twice daily to neck until clear, Disp: 60 g, Rfl: 2   oxyCODONE (OXY IR/ROXICODONE) 5 MG immediate release tablet, Take 1 tablet (5 mg total) by mouth every 6 (six) hours as needed for severe pain or breakthrough pain (use 30 minutes prior to planned dressing changes)., Disp: 5 tablet, Rfl: 0   pantoprazole (PROTONIX) 40 MG tablet, Take 40 mg by mouth 2 (two) times daily., Disp: , Rfl:    polyethylene glycol-electrolytes (NULYTELY) 420 g solution, Take 4,000 mLs by mouth once for 1 dose., Disp: 4000 mL, Rfl: 0   sacubitril-valsartan (ENTRESTO) 49-51 MG, Take 1 tablet by mouth 2 (two) times daily., Disp: , Rfl:    tazarotene (AVAGE) 0.1 % cream, Apply topically at bedtime., Disp: 60 g, Rfl: 3   torsemide 40 MG TABS, Take 40 mg by mouth 2 (two) times daily. (Patient taking differently: Take 40 mg by mouth in the morning and at bedtime.), Disp: 120 tablet, Rfl: 1   umeclidinium-vilanterol (ANORO ELLIPTA) 62.5-25 MCG/INH AEPB, Inhale 1 puff into the lungs daily in the afternoon., Disp: , Rfl:    VENTOLIN HFA 108 (90 Base) MCG/ACT inhaler, Inhale into the lungs., Disp: , Rfl:   Family History  Problem Relation Age of Onset   Hypertension Mother    Congestive Heart Failure Mother    Hypertension Sister    Diabetes Sister    Pancreatitis Sister    COPD Sister    Pancreatitis Brother    Anemia Neg Hx    Arrhythmia Neg Hx    Asthma Neg Hx    Clotting disorder Neg Hx    Fainting Neg Hx    Heart attack Neg Hx    Heart disease Neg Hx    Heart failure Neg Hx    Hyperlipidemia Neg Hx      Social  History   Tobacco Use   Smoking status: Every Day    Packs/day: 0.50    Years: 24.00    Pack years: 12.00    Types: Cigarettes   Smokeless tobacco: Never   Tobacco comments:    4/5 Smoking 5 cigs a day  Vaping Use   Vaping Use: Never used  Substance Use Topics   Alcohol use: No   Drug use: No    Allergies as of 10/14/2021 - Review Complete 10/14/2021  Allergen Reaction Noted   Ciprofloxacin Other (See Comments) and Swelling 03/27/2016   Iodinated contrast media  05/01/2021   Isosorb dinitrate-hydralazine Other (See Comments) 04/15/2016    Review of Systems:  All systems reviewed and negative except where noted in HPI.   Physical Exam:  BP 96/68 (BP Location: Right Arm, Patient Position: Sitting, Cuff Size: Large)    Pulse 91    Temp 98.6 F (37 C) (Oral)    Ht 5\' 6"  (1.676 m)    Wt 284 lb 12.8 oz (129.2 kg)    BMI 45.97 kg/m  No LMP for male patient.  General:   Alert,  Well-developed, well-nourished, pleasant and cooperative in NAD Head:  Normocephalic and atraumatic. Eyes:  Sclera clear, no icterus.   Conjunctiva pink. Ears:  Normal auditory acuity. Nose:  No deformity, discharge, or lesions. Mouth:  No deformity or lesions,oropharynx pink & moist. Neck:  Supple; no masses or thyromegaly. Lungs:  Respirations even and unlabored.  Clear throughout to auscultation.   No wheezes, crackles, or rhonchi. No acute distress. Heart:  Regular rate and rhythm; no murmurs, clicks, rubs, or gallops. Abdomen:  Normal bowel sounds. Soft, non-tender and non-distended without masses, hepatosplenomegaly, has large nontender, reducible periumbilical hernia.  No guarding or rebound tenderness.   Rectal: Not performed Msk:  Symmetrical without gross deformities. Good, equal movement & strength bilaterally. Pulses:  Normal pulses noted. Extremities:  No clubbing or edema.  No cyanosis. Neurologic:  Alert and oriented x3;  grossly normal neurologically. Skin:  Intact without significant  lesions or rashes. No jaundice. Psych:  Alert and cooperative. Normal mood and affect.  Imaging Studies: Reviewed  Assessment and Plan:   Oscar [name] is a 46 y.o. male with metabolic syndrome, congestive heart failure s/p defibrillator is seen in consultation for management of chronic epigastric pain, history of recurrent acute pancreatitis.  Patient has history of chronic tobacco use which is only risk factor for chronic pancreatitis.  Chronic epigastric pain, history of recurrent acute pancreatitis of unclear etiology ?  Sphincter of Oddi dysfunction IgG4 normal Serum triglycerides normal No history of alcohol use MRI/MRCP was unremarkable in 2021 EGD was unremarkable, gastric biopsies negative in 2021 ?  Gallbladder sludge/cholelithiasis or micro choledocholithiasis given persistently elevated serum alkaline phosphatase levels since 2013 Therefore, recommend endoscopic ultrasound to evaluate pancreas and biliary anatomy, ?  Would he benefit from biliary sphincterotomy given chronically elevated serum alkaline phosphatase levels.  Based on the result, will refer him to general surgery to evaluate for cholecystectomy Patient expressed understanding of the plan Advised patient regarding tight control of diabetes, low-fat, low-carb diet, avoid red meat  Colon cancer screening Recommend colonoscopy   Follow up in 3 to 4 months   Oscar Darby, MD

## 2021-10-14 NOTE — Addendum Note (Signed)
Addended by: Ulyess Blossom L on: 10/14/2021 04:19 PM   Modules accepted: Orders

## 2021-10-17 ENCOUNTER — Other Ambulatory Visit: Payer: Self-pay

## 2021-10-17 ENCOUNTER — Ambulatory Visit: Payer: Medicare Other | Attending: Family | Admitting: Family

## 2021-10-17 ENCOUNTER — Encounter: Payer: Self-pay | Admitting: Family

## 2021-10-17 ENCOUNTER — Encounter: Payer: Self-pay | Admitting: Pharmacist

## 2021-10-17 VITALS — BP 100/87 | HR 90 | Resp 16 | Ht 66.0 in | Wt 281.2 lb

## 2021-10-17 DIAGNOSIS — I480 Paroxysmal atrial fibrillation: Secondary | ICD-10-CM

## 2021-10-17 DIAGNOSIS — K219 Gastro-esophageal reflux disease without esophagitis: Secondary | ICD-10-CM | POA: Diagnosis not present

## 2021-10-17 DIAGNOSIS — I5022 Chronic systolic (congestive) heart failure: Secondary | ICD-10-CM

## 2021-10-17 DIAGNOSIS — N189 Chronic kidney disease, unspecified: Secondary | ICD-10-CM | POA: Diagnosis not present

## 2021-10-17 DIAGNOSIS — J45909 Unspecified asthma, uncomplicated: Secondary | ICD-10-CM | POA: Insufficient documentation

## 2021-10-17 DIAGNOSIS — Z79899 Other long term (current) drug therapy: Secondary | ICD-10-CM | POA: Diagnosis not present

## 2021-10-17 DIAGNOSIS — I252 Old myocardial infarction: Secondary | ICD-10-CM | POA: Diagnosis not present

## 2021-10-17 DIAGNOSIS — Z95810 Presence of automatic (implantable) cardiac defibrillator: Secondary | ICD-10-CM | POA: Insufficient documentation

## 2021-10-17 DIAGNOSIS — G4733 Obstructive sleep apnea (adult) (pediatric): Secondary | ICD-10-CM | POA: Diagnosis not present

## 2021-10-17 DIAGNOSIS — M549 Dorsalgia, unspecified: Secondary | ICD-10-CM | POA: Diagnosis not present

## 2021-10-17 DIAGNOSIS — Z9989 Dependence on other enabling machines and devices: Secondary | ICD-10-CM | POA: Diagnosis not present

## 2021-10-17 DIAGNOSIS — E785 Hyperlipidemia, unspecified: Secondary | ICD-10-CM | POA: Diagnosis not present

## 2021-10-17 DIAGNOSIS — I10 Essential (primary) hypertension: Secondary | ICD-10-CM

## 2021-10-17 DIAGNOSIS — Z8719 Personal history of other diseases of the digestive system: Secondary | ICD-10-CM | POA: Diagnosis not present

## 2021-10-17 DIAGNOSIS — Z87891 Personal history of nicotine dependence: Secondary | ICD-10-CM | POA: Diagnosis not present

## 2021-10-17 DIAGNOSIS — Z9981 Dependence on supplemental oxygen: Secondary | ICD-10-CM | POA: Insufficient documentation

## 2021-10-17 DIAGNOSIS — E1122 Type 2 diabetes mellitus with diabetic chronic kidney disease: Secondary | ICD-10-CM | POA: Diagnosis not present

## 2021-10-17 DIAGNOSIS — I130 Hypertensive heart and chronic kidney disease with heart failure and stage 1 through stage 4 chronic kidney disease, or unspecified chronic kidney disease: Secondary | ICD-10-CM | POA: Insufficient documentation

## 2021-10-17 DIAGNOSIS — I471 Supraventricular tachycardia: Secondary | ICD-10-CM | POA: Insufficient documentation

## 2021-10-17 DIAGNOSIS — K861 Other chronic pancreatitis: Secondary | ICD-10-CM

## 2021-10-17 DIAGNOSIS — Z7984 Long term (current) use of oral hypoglycemic drugs: Secondary | ICD-10-CM | POA: Insufficient documentation

## 2021-10-17 DIAGNOSIS — R109 Unspecified abdominal pain: Secondary | ICD-10-CM | POA: Diagnosis not present

## 2021-10-17 DIAGNOSIS — Z720 Tobacco use: Secondary | ICD-10-CM

## 2021-10-17 DIAGNOSIS — R42 Dizziness and giddiness: Secondary | ICD-10-CM | POA: Diagnosis not present

## 2021-10-17 DIAGNOSIS — E119 Type 2 diabetes mellitus without complications: Secondary | ICD-10-CM

## 2021-10-17 DIAGNOSIS — I4891 Unspecified atrial fibrillation: Secondary | ICD-10-CM | POA: Insufficient documentation

## 2021-10-17 DIAGNOSIS — Z794 Long term (current) use of insulin: Secondary | ICD-10-CM

## 2021-10-17 NOTE — Progress Notes (Signed)
[place] - PHARMACIST COUNSELING NOTE  *HFrEF*  Guideline-Directed Medical Therapy/Evidence Based Medicine  ACE/ARB/ARNI: Sacubitril-valsartan 49-51 mg twice daily Beta Blocker: none due to hypotension Aldosterone Antagonist:  none due to hypotension Diuretic: Torsemide 40 mg BID SGLT2i:  canagliflozin 300mg   Adherence Assessment  Do you ever forget to take your medication? [] Yes [x] No  Do you ever skip doses due to side effects? [] Yes [x] No  Do you have trouble affording your medicines? [] Yes [x] No  Are you ever unable to pick up your medication due to transportation difficulties? [] Yes [x] No  Do you ever stop taking your medications because you don't believe they are helping? [] Yes [x] No  Do you check your weight daily? [x] Yes [] No   Adherence strategy: pill dispenser  Barriers to obtaining medications: none  Vital signs: HR 90, BP 100/87, weight (pounds) 281 lb ECHO: Date 11/23/20, EF < 20%, notes moderate LVH and moderately elevated PA pressure  BMP Latest Ref Rng & Units 10/05/2021 10/04/2021 10/03/2021  Glucose 70 - 99 mg/dL 106(H) 104(H) 156(H)  BUN 6 - 20 mg/dL 21(H) 30(H) 33(H)  Creatinine 0.61 - 1.24 mg/dL 1.14 1.50(H) 1.44(H)  Sodium 135 - 145 mmol/L 135 136 132(L)  Potassium 3.5 - 5.1 mmol/L 3.9 4.0 4.0  Chloride 98 - 111 mmol/L 99 101 98  CO2 22 - 32 mmol/L 27 28 26   Calcium 8.9 - 10.3 mg/dL 8.9 9.0 9.3    Past Medical History:  Diagnosis Date   AICD (automatic cardioverter/defibrillator) present    Asthma    Cardiomyopathy ([place])    CHF (congestive heart failure) ([facility])    Coronary artery disease    Deafness in right ear    Diabetes mellitus without complication ([place])    Dilated cardiomyopathy ([facility])    Dysrhythmia    svt   Failure in dosage    chronic respiratory    GERD (gastroesophageal reflux disease)    Hyperlipidemia    Hypertension    Hypoxemia    Hypoxemia    Mild obesity    Myocardial  infarction [facility])    6270,3500,9/38   Pancreatitis    Sleep apnea    osa    ASSESSMENT 46 year old male who presents to the HF clinic for follow up. PMH significant for atrial fibrillation, h/o paroxysmal SVT, nonischemic cardiomyopathy with LVEF <20%, s/p dual AICD placement (2016), acute on chronic systolic congestive heart failure, previous NSTEMI's (2014 & 2016) (normal coronary anatomy per [facility] in 2016), LBBB, hypertension, hyperlipidemia, OSA, COPD, DM type II, tobacco use, obesity and GERD.   Recent ED Visit (past 6 months): Admitted 10/03/21 due to pancreatitis.  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 I & D and debridement completed. Placed on antibiotics. Discharged the following day.  Patient reports some dizziness symptoms of hypotension while off spironolactone and BB. Noted recent admission for pancreatitis (recurrent for this patient).   PLAN  - Decrease Entresto from 49/51mg  BID to 24/26mg  BID. - Continue all other medication as prescribed. - Consider resume low dose metoprolol succinate if BP improved at next OV d/t positive mortality data. - Doubtful on patient ability to tolerate aldosterone antagonist or further Entresto titration.   Time spent: 15 minutes  Arnette Driggs Rodriguez-Guzman PharmD, BCPS 10/17/2021 1:19 PM   Current Outpatient Medications:    allopurinol (ZYLOPRIM) 300 MG tablet, Take 300 mg by mouth daily., Disp: , Rfl:    atorvastatin (LIPITOR) 40 MG tablet, Take 1 tablet (40 mg total) by mouth daily at  6 PM., Disp: 90 tablet, Rfl: 3   clindamycin (CLEOCIN-T) 1 % lotion, Apply to affected areas daily after shower., Disp: 60 mL, Rfl: 3   hyoscyamine (LEVSIN SL) 0.125 MG SL tablet, Take by mouth every 6 (six) hours as needed., Disp: , Rfl:    INVOKANA 300 MG TABS tablet, Take 300 mg by mouth every morning., Disp: , Rfl:    ketoconazole (NIZORAL) 2 % cream, Apply twice daily to neck until clear, Disp: 60 g, Rfl: 2   oxyCODONE (OXY IR/ROXICODONE) 5 MG  immediate release tablet, Take 1 tablet (5 mg total) by mouth every 6 (six) hours as needed for severe pain or breakthrough pain (use 30 minutes prior to planned dressing changes)., Disp: 5 tablet, Rfl: 0   pantoprazole (PROTONIX) 40 MG tablet, Take 40 mg by mouth 2 (two) times daily., Disp: , Rfl:    sacubitril-valsartan (ENTRESTO) 49-51 MG, Take 1 tablet by mouth 2 (two) times daily., Disp: , Rfl:    tazarotene (AVAGE) 0.1 % cream, Apply topically at bedtime., Disp: 60 g, Rfl: 3   torsemide 40 MG TABS, Take 40 mg by mouth 2 (two) times daily. (Patient taking differently: Take 40 mg by mouth daily.), Disp: 120 tablet, Rfl: 1   umeclidinium-vilanterol (ANORO ELLIPTA) 62.5-25 MCG/INH AEPB, Inhale 1 puff into the lungs daily in the afternoon., Disp: , Rfl:    VENTOLIN HFA 108 (90 Base) MCG/ACT inhaler, Inhale into the lungs. (Patient not taking: Reported on 10/17/2021), Disp: , Rfl:     MEDICATION ADHERENCES TIPS AND STRATEGIES Taking medication as prescribed improves patient outcomes in heart failure (reduces hospitalizations, improves symptoms, increases survival) Side effects of medications can be managed by decreasing doses, switching agents, stopping drugs, or adding additional therapy. Please let someone in the [facility] know if you have having bothersome side effects so we can modify your regimen. Do not alter your medication regimen without talking to [place].  Medication reminders can help patients remember to take drugs on time. If you are missing or forgetting doses you can try linking behaviors, using pill boxes, or an electronic reminder like an alarm on your phone or an app. Some people can also get automated phone calls as medication reminders.

## 2021-10-17 NOTE — Patient Instructions (Signed)
Take your current entresto tablet and CUT IT IN HALF. Take twice daily as you were before.  Continue to weigh and record daily.  Return in 2 months.

## 2021-10-17 NOTE — Progress Notes (Signed)
Patient ID: Oscar [name], male    DOB: 26-Sep-1975, 46 y.o.   MRN: [id number]  Mr Giannini is a 46 y/o male with a history of obstructive sleep apnea (currently with CPAP), MI, HTN, hyperlipidemia, GERD, DM, SVT, asthma and chronic heart failure.  Echo report from 11/23/20 reviewed and showed an EF of <20% along with moderate LVH and moderately elevated PA pressure. Echo done 02/27/20 reviewed and showed an EF of <20%. Echo done 03/01/2019 which showed an EF of 15% along with mild MR. Echo done 06/23/18 reviewed and showed an EF of 20-25% along with mild MR. Echo done 02/06/15 which showed an EF of 25% which is unchanged from previous echo on 12/10/14 which showed an EF of 25% and mild MR.   Admitted 10/03/21 due to pancreatitis. 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 I & D and debridement completed. Placed on antibiotics. Discharged the following day.   He presents today for a follow-up visit with a chief complaint of moderate fatigue with moderate exertion. He describes this as chronic in nature having been present for several years. He has associated cough, shortness of breath, and light-headedness.  He denies any chest pain, palpitations, or weight gain.   He is down 18 lbs from his last visit, due to recent bout with pancreatitis.   He weighs daily and checks his BS daily.  Past Medical History:  Diagnosis Date   AICD (automatic cardioverter/defibrillator) present    Asthma    Cardiomyopathy ([place])    CHF (congestive heart failure) ([place])    Coronary artery disease    Deafness in right ear    Diabetes mellitus without complication ([place])    Dilated cardiomyopathy ([place])    Dysrhythmia    svt   Failure in dosage    chronic respiratory    GERD (gastroesophageal reflux disease)    Hyperlipidemia    Hypertension    Hypoxemia    Hypoxemia    Mild obesity    Myocardial infarction [facility])    [id number],0/17   Pancreatitis    Sleep apnea    osa   Past Surgical History:  Procedure  Laterality Date   CARDIAC CATHETERIZATION  12/11/2014   Procedure: RIGHT/LEFT HEART CATH AND CORONARY ANGIOGRAPHY;  Surgeon: Lorretta Harp, MD;  Location: [facility] CATH LAB;  Service: Cardiovascular;;   ICD LEAD REMOVAL N/A 03/30/2015   Procedure: ICD LEAD REMOVAL;  Surgeon: Marzetta Board, MD;  Location: [facility] ORS;  Service: Cardiovascular;  Laterality: N/A;   IMPLANTABLE CARDIOVERTER DEFIBRILLATOR IMPLANT     INCISION AND DRAINAGE ABSCESS N/A 12/12/2020   Procedure: INCISION AND DRAINAGE ABSCESS;  Surgeon: Jules Husbands, MD;  Location: [facility] ORS;  Service: General;  Laterality: N/A;   INSERT / REPLACE / [place] N/A 12/09/2014   Procedure: LEFT HEART CATHETERIZATION WITH CORONARY ANGIOGRAM;  Surgeon: Burnell Blanks, MD;  Location: [facility] CATH LAB;  Service: Cardiovascular;  Laterality: N/A;   Family History  Problem Relation Age of Onset   Hypertension Mother    Congestive Heart Failure Mother    Hypertension Sister    Diabetes Sister    Pancreatitis Sister    COPD Sister    Pancreatitis Brother    Anemia Neg Hx    Arrhythmia Neg Hx    Asthma Neg Hx    Clotting disorder Neg Hx    Fainting Neg Hx    Heart attack Neg Hx  Heart disease Neg Hx    Heart failure Neg Hx    Hyperlipidemia Neg Hx    Social History   Tobacco Use   Smoking status: Every Day    Packs/day: 0.50    Years: 24.00    Pack years: 12.00    Types: Cigarettes   Smokeless tobacco: Never   Tobacco comments:    4/5 Smoking 5 cigs a day  Substance Use Topics   Alcohol use: No   Allergies  Allergen Reactions   Ciprofloxacin Other (See Comments) and Swelling    Migraine Headache Migraine Headache   Iodinated Contrast Media     Other reaction(s): Vomiting Projectile vomiting   Isosorb Dinitrate-Hydralazine Other (See Comments)    Migraine Headache Migraine Headache Other reaction(s): Other (See Comments) Migraine Headache Other reaction(s):  Other (See Comments) Migraine Headache Migraine Headache Migraine Headache Migraine Headache Migraine Headache Migraine Headache   Prior to Admission medications   Medication Sig Start Date End Date Taking? Authorizing Provider  carvedilol (COREG) 3.125 MG tablet Take 1 tablet (3.125 mg total) by mouth 2 (two) times daily with a meal. 11/25/20  Yes Sharen Hones, MD  empagliflozin (JARDIANCE) 10 MG TABS tablet Take 10 mg by mouth daily.   Yes [provider]  insulin detemir (LEVEMIR) 100 UNIT/ML injection Inject 15 Units into the skin 2 (two) times daily.   Yes [provider]  oxyCODONE (OXY IR/ROXICODONE) 5 MG immediate release tablet Take 1 tablet (5 mg total) by mouth every 6 (six) hours as needed for severe pain or breakthrough pain (use 30 minutes prior to planned dressing changes). 12/13/20  Yes Tylene Fantasia, PA-C  pantoprazole (PROTONIX) 40 MG tablet Take 1 tablet (40 mg total) by mouth daily. 11/25/20 12/25/20 Yes Sharen Hones, MD  sacubitril-valsartan (ENTRESTO) 49-51 MG Take 1 tablet by mouth 2 (two) times daily.   Yes [provider]  tiotropium (SPIRIVA) 18 MCG inhalation capsule Place 18 mcg into inhaler and inhale daily.   Yes [provider]  torsemide 40 MG TABS Take 40 mg by mouth 2 (two) times daily. Patient taking differently: Take 40 mg by mouth in the morning and at bedtime. 11/27/20  Yes Lorella Nimrod, MD  umeclidinium-vilanterol (ANORO ELLIPTA) 62.5-25 MCG/INH AEPB Inhale 1 puff into the lungs daily in the afternoon. 01/31/19  Yes [provider]  VENTOLIN HFA 108 (90 Base) MCG/ACT inhaler Inhale into the lungs. 11/23/20  Yes [provider]  amiodarone (PACERONE) 200 MG tablet Take 200 mg by mouth 2 (two) times daily. Patient not taking: No sig reported 11/15/20 11/29/20  [provider]  atorvastatin (LIPITOR) 40 MG tablet Take 1 tablet (40 mg total) by mouth daily at 6 PM. Patient not taking: Reported on  12/26/2020 11/04/18   Alisa Graff, FNP  rivaroxaban (XARELTO) 20 MG TABS tablet Take 20 mg by mouth daily with supper. Patient not taking: No sig reported    [provider]  sacubitril-valsartan (ENTRESTO) 97-103 MG Take 1 tablet by mouth 2 (two) times daily. Patient taking once a day 08/13/20   Alisa Graff, FNP    Review of Systems  Constitutional:  Positive for fatigue. Negative for appetite change.  HENT:  Negative for congestion, postnasal drip and sore throat.   Eyes: Negative.   Respiratory:  Positive for cough and shortness of breath. Negative for chest tightness and wheezing.   Cardiovascular:  Negative for chest pain, palpitations and leg swelling.  Gastrointestinal:  Positive for abdominal pain (  at times). Negative for abdominal distention, diarrhea and nausea.  Endocrine: Negative.   Genitourinary: Negative.   Musculoskeletal:  Positive for back pain. Negative for arthralgias.  Skin: Negative.   Allergic/Immunologic: Negative.   Neurological:  Positive for dizziness and light-headedness. Negative for headaches.  Hematological:  Negative for adenopathy. Does not bruise/bleed easily.  Psychiatric/Behavioral:  Positive for sleep disturbance (sleeping on 4 pillows with CPAP/ oxygen). Negative for dysphoric mood. The patient is not nervous/anxious.    Vitals:   10/17/21 0916  BP: 100/87  Pulse: 90  Resp: 16  SpO2: 99%  Weight: 281 lb 4 oz (127.6 kg)  Height: 5\' 6"  (1.676 m)   Wt Readings from Last 3 Encounters:  10/17/21 281 lb 4 oz (127.6 kg)  10/14/21 284 lb 12.8 oz (129.2 kg)  10/03/21 287 lb (130.2 kg)   Lab Results  Component Value Date   CREATININE 1.14 10/05/2021   CREATININE 1.50 (H) 10/04/2021   CREATININE 1.44 (H) 10/03/2021    Physical Exam Vitals and nursing note reviewed.  Constitutional:      Appearance: He is well-developed.  HENT:     Head: Normocephalic and atraumatic.  Neck:     Vascular: No JVD.  Cardiovascular:     Rate and  Rhythm: Normal rate and regular rhythm.  Pulmonary:     Effort: Pulmonary effort is normal.     Breath sounds: No wheezing or rales.  Abdominal:     General: There is no distension.     Palpations: Abdomen is soft.     Tenderness: There is no abdominal tenderness.  Musculoskeletal:        General: No tenderness.     Cervical back: Normal range of motion and neck supple.     Right lower leg: No edema.     Left lower leg: No edema.  Skin:    General: Skin is warm and dry.  Neurological:     Mental Status: He is alert and oriented to person, place, and time.  Psychiatric:        Behavior: Behavior normal.        Thought Content: Thought content normal.    Assessment & Plan:  1: Chronic heart failure with reduced ejection fraction- - NYHA class II - euvolemic today - weighing daily; Reminded to call for an overnight weight gain of >2 pounds or a weekly weight gain of >5 pounds.  - weight down 18 pounds from last visit here in April 2022, attributes to recent hospitalization due to pancreatitis - will see cardiology [name]) 11/05/21 - advised to drink 48 ounces of water daily - BNP on 12/26/20 was 136.9 - AICD in place - on GDMT of entreso - carvedilol stopped by cardiology  - changed from jardiance to invokana by provider  - persistent dizziness, BP 100/87, will decrease entresto dose today 24-26 mg BID, he will cut current tablet in half and take BID until he runs out, then new bottle will reflect 24-26 - BNP 12/26/20 was 136.9 - PharmD reconciled medications with the patient  2: HTN with CKD- - BP 100/87, persistent dizziness, adjusting entresto per above - sees PCP [name]) at [facility]; has an appt today with them  - BMP from 10/05/21 sodium 135, potassium 3.9, Cr 1.14, gfr > 60 - saw nephrology 09/26/21, returns on 10/24/21  3: Obstructive sleep apnea- - wears CPAP HS, 8 hours/night - continues wearing oxygen at 3L at bedtime & PRN during the  day  4: Diabetes- - fasting glucose at home was 157 - A1c from 09/26/21 was 9.3% - jardiance was changed to invokana  5: Previous tobacco use- - did not discuss today, nephrology note indicates he completely stopped back in December  6: Atrial fibrillation- - RRR in office today  7: Recent pancreatitis- - hospital admission - down 18 lbs due to decreased appetite - mild intermittent abdominal pain - has colonoscopy 11/06/21   Patient did not bring his medication bottles but reviewed them with pharmD to the best of his recollection.   Return in 2 months or sooner for any questions/problems before then.

## 2021-10-28 ENCOUNTER — Encounter: Payer: Self-pay | Admitting: Surgery

## 2021-10-28 ENCOUNTER — Other Ambulatory Visit
Admission: RE | Admit: 2021-10-28 | Discharge: 2021-10-28 | Disposition: A | Discharge status: Home / Self Care | Payer: Medicare Other | Attending: Surgery | Admitting: Surgery

## 2021-10-28 ENCOUNTER — Ambulatory Visit (INDEPENDENT_AMBULATORY_CARE_PROVIDER_SITE_OTHER): Payer: Medicare Other | Admitting: Surgery

## 2021-10-28 VITALS — BP 123/96 | HR 75 | Temp 97.8°F | Ht 66.0 in | Wt 277.8 lb

## 2021-10-28 DIAGNOSIS — E1369 Other specified diabetes mellitus with other specified complication: Secondary | ICD-10-CM | POA: Insufficient documentation

## 2021-10-28 DIAGNOSIS — K436 Other and unspecified ventral hernia with obstruction, without gangrene: Secondary | ICD-10-CM | POA: Diagnosis not present

## 2021-10-28 DIAGNOSIS — J441 Chronic obstructive pulmonary disease with (acute) exacerbation: Secondary | ICD-10-CM

## 2021-10-28 DIAGNOSIS — I509 Heart failure, unspecified: Secondary | ICD-10-CM | POA: Diagnosis present

## 2021-10-28 LAB — CBC
HCT: 45 % (ref 39.0–52.0)
Hemoglobin: 15.3 g/dL (ref 13.0–17.0)
MCH: 32.1 pg (ref 26.0–34.0)
MCHC: 34 g/dL (ref 30.0–36.0)
MCV: 94.5 fL (ref 80.0–100.0)
Platelets: 184 10*3/uL (ref 150–400)
RBC: 4.76 MIL/uL (ref 4.22–5.81)
RDW: 11.8 % (ref 11.5–15.5)
WBC: 4.5 10*3/uL (ref 4.0–10.5)
nRBC: 0 % (ref 0.0–0.2)

## 2021-10-28 LAB — COMPREHENSIVE METABOLIC PANEL
ALT: 44 U/L (ref 0–44)
AST: 34 U/L (ref 15–41)
Albumin: 4.2 g/dL (ref 3.5–5.0)
Alkaline Phosphatase: 280 U/L — ABNORMAL HIGH (ref 38–126)
Anion gap: 12 (ref 5–15)
BUN: 41 mg/dL — ABNORMAL HIGH (ref 6–20)
CO2: 27 mmol/L (ref 22–32)
Calcium: 9.7 mg/dL (ref 8.9–10.3)
Chloride: 95 mmol/L — ABNORMAL LOW (ref 98–111)
Creatinine, Ser: 1.69 mg/dL — ABNORMAL HIGH (ref 0.61–1.24)
GFR, Estimated: 50 mL/min — ABNORMAL LOW (ref >60)
Glucose, Bld: 177 mg/dL — ABNORMAL HIGH (ref 70–99)
Potassium: 4 mmol/L (ref 3.5–5.1)
Sodium: 134 mmol/L — ABNORMAL LOW (ref 135–145)
Total Bilirubin: 0.7 mg/dL (ref 0.3–1.2)
Total Protein: 9.2 g/dL — ABNORMAL HIGH (ref 6.5–8.1)

## 2021-10-28 LAB — HEMOGLOBIN A1C
Hgb A1c MFr Bld: 8.7 % — ABNORMAL HIGH (ref 4.8–5.6)
Mean Plasma Glucose: 203 mg/dL

## 2021-10-28 NOTE — Patient Instructions (Signed)
Please exercise daily and try to lose a few pounds.   A referral has been placed with Cardiology and Pulmonology. They will call you for an appointment.  Please go to [facility] to have your labs drawn. You do not need an appointment. Go into the [place] [facility].   If you have any concerns or questions, please feel free to call our office. See follow up appointment below.   Umbilical Hernia, Adult A hernia is a bulge of tissue that pushes through an opening between muscles. An umbilical hernia happens in the abdomen, near the belly button (umbilicus). The hernia may contain tissues from the small intestine, large intestine, or fatty tissue covering the intestines. Umbilical hernias in adults tend to get worse over time, and they require surgical treatment. There are different types of umbilical hernias, including: Indirect hernia. This type is located just above or below the umbilicus. It is the most common type of umbilical hernia in adults. Direct hernia. This type forms through an opening formed by the umbilicus. Reducible hernia. This type of hernia comes and goes. It may be visible only when you strain, lift something heavy, or cough. This type of hernia can be pushed back into the abdomen (reduced). Incarcerated hernia. This type traps abdominal tissue inside the hernia. This type of hernia cannot be reduced. Strangulated hernia. This type of hernia cuts off blood flow to the tissues inside the hernia. The tissues can start to die if this happens. This type of hernia requires emergency treatment. What are the causes? An umbilical hernia happens when tissue inside the abdomen presses on a weak area of the abdominal muscles. What increases the risk? You may have a greater risk of this condition if you: Are obese. Have had several pregnancies. Have a buildup of fluid inside your abdomen. Have had surgery that weakens the abdominal muscles. What are the signs or symptoms? The main symptom of this  condition is a painless bulge at or near the belly button. A reducible hernia may be visible only when you strain, lift something heavy, or cough. Other symptoms may include: Dull pain. A feeling of pressure. Symptoms of a strangulated hernia may include: Pain that gets increasingly worse. Nausea and vomiting. Pain when pressing on the hernia. Skin over the hernia becoming red or purple. Constipation. Blood in the stool. How is this diagnosed? This condition may be diagnosed based on: A physical exam. You may be asked to cough or strain while standing. These actions increase the pressure inside your abdomen and can force the hernia through the opening in your muscles. Your health care provider may try to reduce the hernia by pressing on it. Your symptoms and medical history. How is this treated? Surgery is the only treatment for an umbilical hernia. Surgery for a strangulated hernia is done as soon as possible. If you have a small hernia that is not incarcerated, you may need to lose weight before having surgery. Follow these instructions at home: Lose weight, if told by your health care provider. Do not try to push the hernia back in. Watch your hernia for any changes in color or size. Tell your health care provider if any changes occur. You may need to avoid activities that increase pressure on your hernia. Do not lift anything that is heavier than 10 lb (4.5 kg), or the limit that you are told, until your health care provider says that it is safe. Take over-the-counter and prescription medicines only as told by your health care  provider. Keep all follow-up visits. This is important. Contact a health care provider if: Your hernia gets larger. Your hernia becomes painful. Get help right away if: You develop sudden, severe pain near the area of your hernia. You have pain as well as nausea or vomiting. You have pain and the skin over your hernia changes color. You develop a fever or  chills. Summary A hernia is a bulge of tissue that pushes through an opening between muscles. An umbilical hernia happens near the belly button. Surgery is the only treatment for an umbilical hernia. Do not try to push your hernia back in. Keep all follow-up visits. This is important. This information is not intended to replace advice given to you by your health care provider. Make sure you discuss any questions you have with your health care provider. Document Revised: 03/26/2020 Document Reviewed: 03/26/2020 Elsevier Patient Education  [place].

## 2021-10-29 ENCOUNTER — Telehealth: Payer: Self-pay

## 2021-10-29 NOTE — Telephone Encounter (Signed)
Pt notified of lab results. Verbalized understanding.

## 2021-10-30 ENCOUNTER — Encounter: Payer: Self-pay | Admitting: Surgery

## 2021-10-30 ENCOUNTER — Telehealth: Payer: Self-pay | Admitting: Gastroenterology

## 2021-10-30 NOTE — Progress Notes (Signed)
Patient ID: Oscar [name], male   DOB: 1975-11-20, 46 y.o.   MRN: [id number]  HPI Oscar [name] is a 46 y.o. male seen in consultation at the request of Dr. Marius Ditch.  He does have a large symptomatic ventral hernia.  Reports intermittent abdominal pain and a bulge.  The pain is moderate intensity worsening with Valsalva.  No fevers no chills.  He does have significant comorbidities to include a BMI of 44.8 , COPD, coronary artery disease, CHF with an ejection fraction of 15%.,  He does have a history of pancreatitis and prior MIs. Currently he seems to be compensated and is able to walk.  He does have some dyspnea on exertion.  Denies any angina. He did have a CT scan that I personally reviewed showing evidence of ventral hernia.  He also had acute pancreatitis at thiat time. He also had an MRCP last year in outside facility that was normal.  He had gallbladder sludge at some point in time. He has been seen by gastro.  Dr. Marius Ditch.  She thinks that he did have microlithiasis of the gallbladder. creatinine of 1.69 CBC is normal.  He does have chronic elevation of the alkaline phosphatase in the 280 last time.  HPI  Past Medical History:  Diagnosis Date   AICD (automatic cardioverter/defibrillator) present    Asthma    Cardiomyopathy ([place] [address])    CHF (congestive heart failure) ([place])    Coronary artery disease    Deafness in right ear    Diabetes mellitus without complication ([place])    Dilated cardiomyopathy ([place])    Dysrhythmia    svt   Failure in dosage    chronic respiratory    GERD (gastroesophageal reflux disease)    Hyperlipidemia    Hypertension    Hypoxemia    Hypoxemia    Mild obesity    Myocardial infarction [facility])    [id number],7/86   Pancreatitis    Sleep apnea    osa    Past Surgical History:  Procedure Laterality Date   CARDIAC CATHETERIZATION  12/11/2014   Procedure: RIGHT/LEFT HEART CATH AND CORONARY ANGIOGRAPHY;  Surgeon: Lorretta Harp, MD;  Location: [facility] CATH LAB;   Service: Cardiovascular;;   ICD LEAD REMOVAL N/A 03/30/2015   Procedure: ICD LEAD REMOVAL;  Surgeon: Marzetta Board, MD;  Location: [facility] ORS;  Service: Cardiovascular;  Laterality: N/A;   IMPLANTABLE CARDIOVERTER DEFIBRILLATOR IMPLANT     INCISION AND DRAINAGE ABSCESS N/A 12/12/2020   Procedure: INCISION AND DRAINAGE ABSCESS;  Surgeon: Jules Husbands, MD;  Location: [facility] ORS;  Service: General;  Laterality: N/A;   INSERT / REPLACE / [place] N/A 12/09/2014   Procedure: LEFT HEART CATHETERIZATION WITH CORONARY ANGIOGRAM;  Surgeon: Burnell Blanks, MD;  Location: [facility] CATH LAB;  Service: Cardiovascular;  Laterality: N/A;    Family History  Problem Relation Age of Onset   Hypertension Mother    Congestive Heart Failure Mother    Hypertension Sister    Diabetes Sister    Pancreatitis Sister    COPD Sister    Pancreatitis Brother    Anemia Neg Hx    Arrhythmia Neg Hx    Asthma Neg Hx    Clotting disorder Neg Hx    Fainting Neg Hx    Heart attack Neg Hx    Heart disease Neg Hx    Heart failure Neg Hx    Hyperlipidemia Neg Hx  Social History Social History   Tobacco Use   Smoking status: Every Day    Packs/day: 0.50    Years: 24.00    Pack years: 12.00    Types: Cigarettes   Smokeless tobacco: Never   Tobacco comments:    4/5 Smoking 5 cigs a day  Vaping Use   Vaping Use: Never used  Substance Use Topics   Alcohol use: No   Drug use: No    Allergies  Allergen Reactions   Ciprofloxacin Other (See Comments) and Swelling    Migraine Headache Migraine Headache   Iodinated Contrast Media     Other reaction(s): Vomiting Projectile vomiting   Isosorb Dinitrate-Hydralazine Other (See Comments)    Migraine Headache Migraine Headache Other reaction(s): Other (See Comments) Migraine Headache Other reaction(s): Other (See Comments) Migraine Headache Migraine Headache Migraine Headache Migraine  Headache Migraine Headache Migraine Headache    Current Outpatient Medications  Medication Sig Dispense Refill   allopurinol (ZYLOPRIM) 300 MG tablet Take 300 mg by mouth daily.     atorvastatin (LIPITOR) 40 MG tablet Take 1 tablet (40 mg total) by mouth daily at 6 PM. 90 tablet 3   clindamycin (CLEOCIN-T) 1 % lotion Apply to affected areas daily after shower. 60 mL 3   hyoscyamine (LEVSIN SL) 0.125 MG SL tablet Take by mouth every 6 (six) hours as needed.     INVOKANA 300 MG TABS tablet Take 300 mg by mouth every morning.     ketoconazole (NIZORAL) 2 % cream Apply twice daily to neck until clear 60 g 2   pantoprazole (PROTONIX) 40 MG tablet Take 40 mg by mouth 2 (two) times daily.     sacubitril-valsartan (ENTRESTO) 49-51 MG Take 0.5 tablets by mouth 2 (two) times daily.     tazarotene (AVAGE) 0.1 % cream Apply topically at bedtime. 60 g 3   torsemide 40 MG TABS Take 40 mg by mouth 2 (two) times daily. (Patient taking differently: Take 40 mg by mouth daily.) 120 tablet 1   umeclidinium-vilanterol (ANORO ELLIPTA) 62.5-25 MCG/INH AEPB Inhale 1 puff into the lungs daily in the afternoon.     VENTOLIN HFA 108 (90 Base) MCG/ACT inhaler Inhale into the lungs.     No current facility-administered medications for this visit.     Review of Systems Full ROS  was asked and was negative except for the information on the HPI  Physical Exam Blood pressure (!) 123/96, pulse 75, temperature 97.8 F (36.6 C), temperature source Oral, height 5\' 6"  (1.676 m), weight 277 lb 12.8 oz (126 kg), SpO2 96 %. CONSTITUTIONAL: NAD BMI 44.8. EYES: Pupils are equal, round,, Sclera are non-icteric. EARS, NOSE, MOUTH AND THROAT: he is wearing a mask Hearing is intact to voice. LYMPH NODES:  Lymph nodes in the neck are normal. RESPIRATORY:  Lungs some exp wheezes. There is normal respiratory effort, with equal breath sounds bilaterally, and without pathologic use of accessory muscles. CARDIOVASCULAR: Heart is  regular without murmurs, gallops, or rubs. GI: The abdomen is  soft, chronically incarcerated ventral hernia measures approximately 4 cm there are no palpable masses. There is no hepatosplenomegaly. There are normal bowel sounds  GU: Rectal deferred.   MUSCULOSKELETAL: Normal muscle strength and tone. No cyanosis or edema.   SKIN: Turgor is good and there are no pathologic skin lesions or ulcers. NEUROLOGIC: Motor and sensation is grossly normal. Cranial nerves are grossly intact. PSYCH:  Oriented to person, place and time. Affect is normal.  Data Reviewed  I  have personally reviewed the patient's imaging, laboratory findings and medical records.    Assessment/Plan 46 year old male with multiple comorbidities including super morbid obesity with a BMI of 44.8, chronic kidney disease, coronary artery disease, A-fib, heart failure with an ejection fraction of 15%, COPD and now presents with symptomatic ventral hernia.  I had an extensive and candid discussion about his disease process.  He is a very high risk for perioperative morbidity and also complications related to his hernia surgery.  I will get cardiac pulmonary and renal input regarding his condition.  In addition to that he does have some micro lithiasis of the gallbladder that may be related to his pancreatitis.  If this is in fact the case he may also need cholecystectomy as well.  This is a very challenging case.  Even without major comorbidities he will probably benefit from cholecystectomy and ventral hernia repair.  The question would be the optimal timing of the different operations.  This is again in a person without the major comorbidities that [place] possesses.  I will see him back in a few weeks once he has seen cardiology as well as pulmonary medicine.  Currently he does have chronic incarcerated ventral hernia but does not require emergent surgical intervention.  Best outcome will be to optimize him preoperatively, modify risk  factors to include weight loss.  Extensive counseling provided.  He understands.  Please note that I spent greater than 60 minutes in this encounter including coordination of his care, placing orders, personally reviewing imaging studies and performing appropriate documentation  Caroleen Hamman, MD FACS General Surgeon 10/30/2021, 3:11 PM

## 2021-10-30 NOTE — Telephone Encounter (Signed)
Patient states that he is feeling sick and cannot keep any food down. Patient states he would like to speak with the DR and reschedule his colonoscopy.  ?

## 2021-10-30 NOTE — Telephone Encounter (Signed)
Patient states he is having vomiting and nausea for 3 days. He states he is having middle abdominal pain  that comes and goes. ?Is able to drink liquids but get full really quick and then get nausea if he drinks to much. He reschedule his colonoscopy from 11/06/21 to 11/13/21 ?

## 2021-10-30 NOTE — Telephone Encounter (Signed)
Send him Zofran for nausea 4mg  as needed every 6hrs for 14pills and ask him to drink his prep small amount at a time ? ?RV ?

## 2021-10-31 MED ORDER — ONDANSETRON HCL 4 MG PO TABS: 4.0000 mg | ORAL_TABLET | Freq: Four times a day (QID) | ORAL | 0 refills | Status: DC | PRN

## 2021-10-31 NOTE — Telephone Encounter (Signed)
Called patient and left a detail message. Sent zofran to the pharmacy  ?

## 2021-10-31 NOTE — Addendum Note (Signed)
Addended by: Ulyess Blossom L on: 10/31/2021 08:06 AM ? ? Modules accepted: Orders ? ?

## 2021-11-13 ENCOUNTER — Telehealth: Payer: Self-pay

## 2021-11-13 ENCOUNTER — Encounter: Payer: Self-pay | Admitting: Gastroenterology

## 2021-11-13 ENCOUNTER — Encounter
Admission: RE | Disposition: A | Discharge status: Home / Self Care | Payer: Self-pay | Source: Home / Self Care | Attending: Gastroenterology

## 2021-11-13 ENCOUNTER — Ambulatory Visit: Payer: Medicare Other | Admitting: Anesthesiology

## 2021-11-13 ENCOUNTER — Other Ambulatory Visit: Payer: Self-pay

## 2021-11-13 ENCOUNTER — Ambulatory Visit
Admission: RE | Admit: 2021-11-13 | Discharge: 2021-11-13 | Disposition: A | Discharge status: Home / Self Care | Payer: Medicare Other | Attending: Gastroenterology | Admitting: Gastroenterology

## 2021-11-13 DIAGNOSIS — I509 Heart failure, unspecified: Secondary | ICD-10-CM | POA: Diagnosis not present

## 2021-11-13 DIAGNOSIS — I252 Old myocardial infarction: Secondary | ICD-10-CM | POA: Insufficient documentation

## 2021-11-13 DIAGNOSIS — D125 Benign neoplasm of sigmoid colon: Secondary | ICD-10-CM | POA: Insufficient documentation

## 2021-11-13 DIAGNOSIS — K861 Other chronic pancreatitis: Secondary | ICD-10-CM

## 2021-11-13 DIAGNOSIS — Z1211 Encounter for screening for malignant neoplasm of colon: Secondary | ICD-10-CM | POA: Diagnosis not present

## 2021-11-13 DIAGNOSIS — I4891 Unspecified atrial fibrillation: Secondary | ICD-10-CM | POA: Insufficient documentation

## 2021-11-13 DIAGNOSIS — Z955 Presence of coronary angioplasty implant and graft: Secondary | ICD-10-CM | POA: Insufficient documentation

## 2021-11-13 DIAGNOSIS — F17210 Nicotine dependence, cigarettes, uncomplicated: Secondary | ICD-10-CM | POA: Diagnosis not present

## 2021-11-13 DIAGNOSIS — K219 Gastro-esophageal reflux disease without esophagitis: Secondary | ICD-10-CM | POA: Insufficient documentation

## 2021-11-13 DIAGNOSIS — Z95810 Presence of automatic (implantable) cardiac defibrillator: Secondary | ICD-10-CM | POA: Insufficient documentation

## 2021-11-13 DIAGNOSIS — K635 Polyp of colon: Secondary | ICD-10-CM | POA: Diagnosis not present

## 2021-11-13 DIAGNOSIS — I110 Hypertensive heart disease with heart failure: Secondary | ICD-10-CM | POA: Insufficient documentation

## 2021-11-13 DIAGNOSIS — I2510 Atherosclerotic heart disease of native coronary artery without angina pectoris: Secondary | ICD-10-CM | POA: Diagnosis not present

## 2021-11-13 DIAGNOSIS — E1165 Type 2 diabetes mellitus with hyperglycemia: Secondary | ICD-10-CM | POA: Diagnosis not present

## 2021-11-13 DIAGNOSIS — J449 Chronic obstructive pulmonary disease, unspecified: Secondary | ICD-10-CM | POA: Insufficient documentation

## 2021-11-13 DIAGNOSIS — Z6841 Body Mass Index (BMI) 40.0 and over, adult: Secondary | ICD-10-CM | POA: Diagnosis not present

## 2021-11-13 HISTORY — PX: COLONOSCOPY WITH PROPOFOL: SHX5780

## 2021-11-13 LAB — GLUCOSE, CAPILLARY: Glucose-Capillary: 159 mg/dL — ABNORMAL HIGH (ref 70–99)

## 2021-11-13 SURGERY — COLONOSCOPY WITH PROPOFOL
Anesthesia: General

## 2021-11-13 MED ORDER — SODIUM CHLORIDE 0.9 % IV SOLN: INTRAVENOUS | Status: DC

## 2021-11-13 MED ORDER — PHENYLEPHRINE 40 MCG/ML (10ML) SYRINGE FOR IV PUSH (FOR BLOOD PRESSURE SUPPORT)
PREFILLED_SYRINGE | INTRAVENOUS | Status: AC
  Filled 2021-11-13: qty 10

## 2021-11-13 MED ORDER — MIDAZOLAM HCL 2 MG/2ML IJ SOLN
INTRAMUSCULAR | Status: DC | PRN
  Administered 2021-11-13 (×2): 1 mg via INTRAVENOUS

## 2021-11-13 MED ORDER — PROPOFOL 10 MG/ML IV BOLUS
INTRAVENOUS | Status: DC | PRN
  Administered 2021-11-13: 30 mg via INTRAVENOUS

## 2021-11-13 MED ORDER — PROPOFOL 500 MG/50ML IV EMUL
INTRAVENOUS | Status: DC | PRN
Start: 2021-11-13 — End: 2021-11-13
  Administered 2021-11-13: 75 ug/kg/min via INTRAVENOUS

## 2021-11-13 MED ORDER — PROPOFOL 500 MG/50ML IV EMUL
INTRAVENOUS | Status: AC
  Filled 2021-11-13: qty 50

## 2021-11-13 MED ORDER — FENTANYL CITRATE (PF) 100 MCG/2ML IJ SOLN
INTRAMUSCULAR | Status: AC
  Filled 2021-11-13: qty 2

## 2021-11-13 MED ORDER — MIDAZOLAM HCL 2 MG/2ML IJ SOLN
INTRAMUSCULAR | Status: AC
  Filled 2021-11-13: qty 2

## 2021-11-13 MED ORDER — PHENYLEPHRINE 40 MCG/ML (10ML) SYRINGE FOR IV PUSH (FOR BLOOD PRESSURE SUPPORT)
PREFILLED_SYRINGE | INTRAVENOUS | Status: DC | PRN
  Administered 2021-11-13 (×2): 120 ug via INTRAVENOUS
  Administered 2021-11-13: 80 ug via INTRAVENOUS

## 2021-11-13 MED ORDER — FENTANYL CITRATE (PF) 100 MCG/2ML IJ SOLN
INTRAMUSCULAR | Status: DC | PRN
  Administered 2021-11-13: 50 ug via INTRAVENOUS

## 2021-11-13 NOTE — Op Note (Signed)
[facility] ?Gastroenterology ?Patient Name: Oscar [name] ?Procedure Date: 11/13/2021 8:25 AM ?MRN: [id number] ?Account #: [id number] ?Date of Birth: 11/20/1975 ?Admit Type: Outpatient ?Age: 46 ?Room: [facility] ENDO ROOM 4 ?Gender: Male ?Note Status: Finalized ?Instrument Name: Colonoscope [id number] ?Procedure:             Colonoscopy ?Indications:           Screening for colorectal malignant neoplasm, This is  ?                       the patient's first colonoscopy ?Providers:             Lin Landsman MD, MD ?Medicines:             General Anesthesia ?Complications:         No immediate complications. Estimated blood loss: None. ?Procedure:             Pre-Anesthesia Assessment: ?                       - Prior to the procedure, a History and Physical was  ?                       performed, and patient medications and allergies were  ?                       reviewed. The patient is competent. The risks and  ?                       benefits of the procedure and the sedation options and  ?                       risks were discussed with the patient. All questions  ?                       were answered and informed consent was obtained.  ?                       Patient identification and proposed procedure were  ?                       verified by the physician, the nurse, the  ?                       anesthesiologist, the anesthetist and the technician  ?                       in the pre-procedure area in the procedure room in the  ?                       endoscopy suite. Mental Status Examination: alert and  ?                       oriented. Airway Examination: normal oropharyngeal  ?                       airway and neck mobility. Respiratory Examination:  ?                       clear to auscultation.  CV Examination: normal.  ?                       Prophylactic Antibiotics: The patient does not require  ?                       prophylactic antibiotics. Prior Anticoagulants: The  ?                        patient has taken no previous anticoagulant or  ?                       antiplatelet agents. ASA Grade Assessment: IV - A  ?                       patient with severe systemic disease that is a  ?                       constant threat to life. After reviewing the risks and  ?                       benefits, the patient was deemed in satisfactory  ?                       condition to undergo the procedure. The anesthesia  ?                       plan was to use general anesthesia. Immediately prior  ?                       to administration of medications, the patient was  ?                       re-assessed for adequacy to receive sedatives. The  ?                       heart rate, respiratory rate, oxygen saturations,  ?                       blood pressure, adequacy of pulmonary ventilation, and  ?                       response to care were monitored throughout the  ?                       procedure. The physical status of the patient was  ?                       re-assessed after the procedure. ?                       After obtaining informed consent, the colonoscope was  ?                       passed under direct vision. Throughout the procedure,  ?                       the patient's blood pressure, pulse, and oxygen  ?  saturations were monitored continuously. The  ?                       Colonoscope was introduced through the anus and  ?                       advanced to the the cecum, identified by appendiceal  ?                       orifice and ileocecal valve. The colonoscopy was  ?                       performed without difficulty. The patient tolerated  ?                       the procedure well. The quality of the bowel  ?                       preparation was evaluated using the BBPS [facility] Bowel  ?                       Preparation Scale) with scores of: Right Colon = 3,  ?                       Transverse Colon = 3 and Left Colon = 3 (entire mucosa  ?                        seen well with no residual staining, small fragments  ?                       of stool or opaque liquid). The total BBPS score  ?                       equals 9. ?Findings: ?     The perianal and digital rectal examinations were normal. Pertinent  ?     negatives include normal sphincter tone and no palpable rectal lesions. ?     Five sessile polyps were found in the sigmoid colon 4 and cecum 1. The  ?     polyps were 3 to 4 mm in size. These polyps were removed with a cold  ?     snare. Resection and retrieval were complete. Estimated blood loss: none. ?     The retroflexed view of the distal rectum and anal verge was normal and  ?     showed no anal or rectal abnormalities. ?     The exam was otherwise without abnormality. ?Impression:            - Five 3 to 4 mm polyps in the sigmoid colon and in  ?                       the cecum, removed with a cold snare. Resected and  ?                       retrieved. ?                       - The distal rectum and anal verge are normal on  ?  retroflexion view. ?                       - The examination was otherwise normal. ?Recommendation:        - Discharge patient to home (with escort). ?                       - Cardiac diet. ?                       - Continue present medications. ?                       - Await pathology results. ?                       - Repeat colonoscopy in 3 - 5 years for surveillance  ?                       based on pathology results. ?Procedure Code(s):     --- Professional --- ?                       [phone], Colonoscopy, flexible; with removal of  ?                       tumor(s), polyp(s), or other lesion(s) by snare  ?                       technique ?Diagnosis Code(s):     --- Professional --- ?                       Z12.11, Encounter for screening for malignant neoplasm  ?                       of colon ?                       K63.5, Polyp of colon ?CPT copyright 2019 American Medical Association. All rights  reserved. ?The codes documented in this report are preliminary and upon coder review may  ?be revised to meet current compliance requirements. ?Dr. Ulyess Mort ?Celisse Ciulla Raeanne Gathers MD, MD ?11/13/2021 9:04:52 AM ?This report has been signed electronically. ?Number of Addenda: 0 ?Note Initiated On: 11/13/2021 8:25 AM ?Scope Withdrawal Time: 0 hours 15 minutes 57 seconds  ?Total Procedure Duration: 0 hours 18 minutes 12 seconds  ?Estimated Blood Loss:  Estimated blood loss: none. Estimated blood loss: none. ?     [facility] ?

## 2021-11-13 NOTE — Telephone Encounter (Signed)
-----   Message from Irving Copas., MD sent at 11/13/2021  8:37 AM EDT ----- ?RV, ?Thanks for reaching out. ?Not sure what has happened and why this referral has not been enacted on.  So I am sorry about that.  I will have our front desk staff check on that but its not clear that the consult never got to my team to work on. ? 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 ?Please let Dr. Marius Ditch and I know when he is scheduled. ?Also, Chong Sicilian can you forward this to our team and see what happened with this getting sent to you? ?Thanks. ?GM ?----- Message ----- ?From: Lin Landsman, MD ?Sent: 11/13/2021   8:09 AM EDT ?To: Irving Copas., MD ? ?Dr Rush Landmark ? ?I have referred this pt to you for upper EUS. Wondering if you have any update about his procedure ? ?Thanks ?RV ? ?

## 2021-11-13 NOTE — H&P (Signed)
?[name], MD ?[address]  ?[address]  ?[place],  [address]  ?Main: [phone]  ?Fax: [phone] ?Pager: [phone] ? ?Primary Care Physician:  [facility] ?Primary Gastroenterologist:  Dr. [name] ? ?Pre-Procedure History & Physical: ?HPI:  Oscar [name] is a 46 y.o. male is here for an colonoscopy. ?  ?Past Medical History:  ?Diagnosis Date  ? AICD (automatic cardioverter/defibrillator) present   ? Asthma   ? Cardiomyopathy ([place])   ? CHF (congestive heart failure) ([place])   ? Coronary artery disease   ? Deafness in right ear   ? Diabetes mellitus without complication ([place])   ? Dilated cardiomyopathy ([place])   ? Dysrhythmia   ? svt  ? Failure in dosage   ? chronic respiratory   ? GERD (gastroesophageal reflux disease)   ? Hyperlipidemia   ? Hypertension   ? Hypoxemia   ? Hypoxemia   ? Mild obesity   ? Myocardial infarction [facility])   ? 7026,3785,8/85  ? Pancreatitis   ? Sleep apnea   ? osa  ? ? ?Past Surgical History:  ?Procedure Laterality Date  ? CARDIAC CATHETERIZATION  12/11/2014  ? Procedure: RIGHT/LEFT HEART CATH AND CORONARY ANGIOGRAPHY;  Surgeon: Lorretta Harp, MD;  Location: [facility] CATH LAB;  Service: Cardiovascular;;  ? ICD LEAD REMOVAL N/A 03/30/2015  ? Procedure: ICD LEAD REMOVAL;  Surgeon: Marzetta Board, MD;  Location: [facility] ORS;  Service: Cardiovascular;  Laterality: N/A;  ? IMPLANTABLE CARDIOVERTER DEFIBRILLATOR IMPLANT    ? INCISION AND DRAINAGE ABSCESS N/A 12/12/2020  ? Procedure: INCISION AND DRAINAGE ABSCESS;  Surgeon: Jules Husbands, MD;  Location: [facility] ORS;  Service: General;  Laterality: N/A;  ? INSERT / REPLACE / REMOVE PACEMAKER    ? LEFT HEART CATHETERIZATION WITH CORONARY ANGIOGRAM N/A 12/09/2014  ? Procedure: LEFT HEART CATHETERIZATION WITH CORONARY ANGIOGRAM;  Surgeon: Burnell Blanks, MD;  Location: [facility] CATH LAB;  Service: Cardiovascular;  Laterality: N/A;  ? ? ?Prior to Admission medications   ?Medication Sig Start Date End Date  Taking? Authorizing Provider  ?allopurinol (ZYLOPRIM) 300 MG tablet Take 300 mg by mouth daily. 10/15/21   [provider]  ?atorvastatin (LIPITOR) 40 MG tablet Take 1 tablet (40 mg total) by mouth daily at 6 PM. 11/04/18   Alisa Graff, FNP  ?clindamycin (CLEOCIN-T) 1 % lotion Apply to affected areas daily after shower. 06/11/21   [name], MD  ?hyoscyamine (LEVSIN SL) 0.125 MG SL tablet Take by mouth every 6 (six) hours as needed. 08/07/21   [provider]  ?INVOKANA 300 MG TABS tablet Take 300 mg by mouth every morning. 10/02/21   [provider]  ?ketoconazole (NIZORAL) 2 % cream Apply twice daily to neck until clear 06/11/21   [name], MD  ?ondansetron (ZOFRAN) 4 MG tablet Take 1 tablet (4 mg total) by mouth every 6 (six) hours as needed for nausea or vomiting. 10/31/21   Lin Landsman, MD  ?pantoprazole (PROTONIX) 40 MG tablet Take 40 mg by mouth 2 (two) times daily. 08/22/21 08/22/22  [provider]  ?sacubitril-valsartan (ENTRESTO) 49-51 MG Take 0.5 tablets by mouth 2 (two) times daily.    [provider]  ?tazarotene (AVAGE) 0.1 % cream Apply topically at bedtime. 06/11/21   [name], MD  ?torsemide 40 MG TABS Take 40 mg by mouth 2 (two) times daily. ?Patient taking differently: Take 40 mg by mouth daily. 11/27/20   Lorella Nimrod, MD  ?umeclidinium-vilanterol [facility]  ELLIPTA) 62.5-25 MCG/INH AEPB Inhale 1 puff into the lungs daily in the afternoon. 01/31/19   [provider]  ?VENTOLIN HFA 108 (90 Base) MCG/ACT inhaler Inhale into the lungs. 11/23/20   [provider]  ? ? ?Allergies as of 10/14/2021 - Review Complete 10/14/2021  ?Allergen Reaction Noted  ? Ciprofloxacin Other (See Comments) and Swelling 03/27/2016  ? Iodinated contrast media  05/01/2021  ? Isosorb dinitrate-hydralazine Other (See Comments) 04/15/2016  ? ? ?Family History  ?Problem Relation Age of Onset  ? Hypertension Mother   ? Congestive Heart Failure Mother   ?  Hypertension Sister   ? Diabetes Sister   ? Pancreatitis Sister   ? COPD Sister   ? Pancreatitis Brother   ? Anemia Neg Hx   ? Arrhythmia Neg Hx   ? Asthma Neg Hx   ? Clotting disorder Neg Hx   ? Fainting Neg Hx   ? Heart attack Neg Hx   ? Heart disease Neg Hx   ? Heart failure Neg Hx   ? Hyperlipidemia Neg Hx   ? ? ?Social History  ? ?Socioeconomic History  ? Marital status: Single  ?  Spouse name: Not on file  ? Number of children: Not on file  ? Years of education: Not on file  ? Highest education level: Not on file  ?Occupational History  ? Occupation: unemployed  ?Tobacco Use  ? Smoking status: Every Day  ?  Packs/day: 0.50  ?  Years: 24.00  ?  Pack years: 12.00  ?  Types: Cigarettes  ? Smokeless tobacco: Never  ? Tobacco comments:  ?  4/5 Smoking 5 cigs a day  ?Vaping Use  ? Vaping Use: Never used  ?Substance and Sexual Activity  ? Alcohol use: No  ? Drug use: No  ? Sexual activity: Yes  ?Other Topics Concern  ? Not on file  ?Social History Narrative  ? Not on file  ? ?Social Determinants of Health  ? ?Financial Resource Strain: Not on file  ?Food Insecurity: Not on file  ?Transportation Needs: Not on file  ?Physical Activity: Not on file  ?Stress: Not on file  ?Social Connections: Not on file  ?Intimate Partner Violence: Not on file  ? ? ?Review of Systems: ?See HPI, otherwise negative ROS ? ?Physical Exam: ?BP 114/70   Pulse 79   Temp 97.7 ?F (36.5 ?C) (Temporal)   Resp 18   Ht '5\' 6"'$  (1.676 m)   Wt 265 lb (120.2 kg)   SpO2 99%   BMI 42.77 kg/m?  ?General:   Alert,  pleasant and cooperative in NAD ?Head:  Normocephalic and atraumatic. ?Neck:  Supple; no masses or thyromegaly. ?Lungs:  Clear throughout to auscultation.    ?Heart:  Regular rate and rhythm. ?Abdomen:  Soft, nontender and nondistended. Normal bowel sounds, without guarding, and without rebound.   ?Neurologic:  Alert and  oriented x4;  grossly normal neurologically. ? ?Impression/Plan: ?Oscar [name] is here for an colonoscopy to be  performed for colon cancer screening ? ?Risks, benefits, limitations, and alternatives regarding  colonoscopy have been reviewed with the patient.  Questions have been answered.  All parties agreeable. ? ? ?Sherri Sear, MD  11/13/2021, 8:15 AM ?

## 2021-11-13 NOTE — Anesthesia Procedure Notes (Signed)
Date/Time: 11/13/2021 8:41 AM ?Performed by: Johnna Acosta, CRNA ?Pre-anesthesia Checklist: Patient identified, Emergency Drugs available, Suction available, Patient being monitored and Timeout performed ?Patient Re-evaluated:Patient Re-evaluated prior to induction ?Oxygen Delivery Method: Supernova nasal CPAP ?Preoxygenation: Pre-oxygenation with 100% oxygen ?Induction Type: IV induction ? ? ? ? ?

## 2021-11-13 NOTE — Transfer of Care (Signed)
Immediate Anesthesia Transfer of Care Note ? ?Patient: Oscar [name] ? ?Procedure(s) Performed: COLONOSCOPY WITH PROPOFOL ? ?Patient Location: PACU ? ?Anesthesia Type:General ? ?Level of Consciousness: sedated ? ?Airway & Oxygen Therapy: Patient Spontanous Breathing and Patient connected to face mask oxygen ? ?Post-op Assessment: Report given to RN and Post -op Vital signs reviewed and stable ? ?Post vital signs: Reviewed and stable ? ?Last Vitals:  ?Vitals Value Taken Time  ?BP 97/68 11/13/21 0910  ?Temp    ?Pulse 80 11/13/21 0911  ?Resp 18 11/13/21 0911  ?SpO2 100 % 11/13/21 0911  ?Vitals shown include unvalidated device data. ? ?Last Pain:  ?Vitals:  ? 11/13/21 0900  ?TempSrc: Temporal  ?PainSc:   ?   ? ?  ? ?Complications: No notable events documented. ?

## 2021-11-13 NOTE — Telephone Encounter (Signed)
EGD/EUS scheduled, pt instructed and medications reviewed.  Patient instructions mailed to home.  Patient to call with any questions or concerns.  ? ?FYI Dr Marius Ditch  ?

## 2021-11-13 NOTE — Anesthesia Postprocedure Evaluation (Signed)
Anesthesia Post Note ? ?Patient: Oscar [name] ? ?Procedure(s) Performed: COLONOSCOPY WITH PROPOFOL ? ?Patient location during evaluation: PACU ?Anesthesia Type: General ?Level of consciousness: awake and alert ?Pain management: pain level controlled ?Vital Signs Assessment: post-procedure vital signs reviewed and stable ?Respiratory status: spontaneous breathing, nonlabored ventilation, respiratory function stable and patient connected to nasal cannula oxygen ?Cardiovascular status: blood pressure returned to baseline and stable ?Postop Assessment: no apparent nausea or vomiting ?Anesthetic complications: no ? ? ?No notable events documented. ? ? ?Last Vitals:  ?Vitals:  ? 11/13/21 0920 11/13/21 0930  ?BP: (!) 84/55 119/87  ?Pulse: 79   ?Resp: 15 11  ?Temp:    ?SpO2: 100% 100%  ?  ?Last Pain:  ?Vitals:  ? 11/13/21 0900  ?TempSrc: Temporal  ?PainSc:   ? ? ?  ?  ?  ?  ?  ?  ? ?Arita Miss ? ? ? ? ?

## 2021-11-13 NOTE — Anesthesia Preprocedure Evaluation (Signed)
Anesthesia Evaluation  ?Patient identified by MRN, date of birth, ID band ?Patient awake ? ? ? ?Reviewed: ?Allergy & Precautions, H&P , NPO status , Patient's Chart, lab work & pertinent test results, reviewed documented beta blocker date and time  ? ?Airway ?Mallampati: III ? ?TM Distance: >3 FB ?Neck ROM: full ? ? ? Dental ? ?(+) Teeth Intact, Poor Dentition ?  ?Pulmonary ?asthma , sleep apnea and Continuous Positive Airway Pressure Ventilation , COPD,  COPD inhaler, Current Smoker and Patient abstained from smoking.,  ?  ?Pulmonary exam normal ?breath sounds clear to auscultation ? ? ? ? ? ? Cardiovascular ?Exercise Tolerance: Poor ?hypertension, + CAD, + Past MI, + Cardiac Stents and +CHF  ?Normal cardiovascular exam+ dysrhythmias Atrial Fibrillation + Cardiac Defibrillator ? ?Rhythm:Regular Rate:Normal ? ?Cardiologist notes that patient is as optimized as he can be, mod-high risk for cardiac complications ? ?1. Severely depressed LVF.  ??2. Left ventricular ejection fraction, by estimation, is <20%. The left  ?ventricle has severely decreased function. The left ventricle demonstrates  ?global hypokinesis. The left ventricular internal cavity size was severely  ?dilated. There is moderate left  ??ventricular hypertrophy. Left ventricular diastolic parameters are  ?consistent with Grade I diastolic dysfunction (impaired relaxation).  ??3. Right ventricular systolic function is moderately reduced. The right  ?ventricular size is moderately enlarged. Mildly increased right  ?ventricular wall thickness. There is moderately elevated pulmonary artery  ?systolic pressure.  ??4. Left atrial size was mild to moderately dilated.  ??5. Right atrial size was mild to moderately dilated.  ??6. The mitral valve is grossly normal. Mild mitral valve regurgitation.  ??7. Tricuspid valve regurgitation is moderate to severe.  ??8. The aortic valve is grossly normal. Aortic valve regurgitation is   ?trivial. Mild aortic valve sclerosis is present, with no evidence of  ?aortic valve stenosis.  ?  ?Neuro/Psych ?negative neurological ROS ? negative psych ROS  ? GI/Hepatic ?Neg liver ROS, GERD  Medicated,  ?Endo/Other  ?diabetes, Poorly ControlledMorbid obesity ? Renal/GU ?Renal disease  ?negative genitourinary ?  ?Musculoskeletal ? ? Abdominal ?(+) + obese,   ?Peds ? Hematology ?negative hematology ROS ?(+)   ?Anesthesia Other Findings ?Past Medical History: ?No date: AICD (automatic cardioverter/defibrillator) present ?No date: Asthma ?No date: Cardiomyopathy [facility]) ?No date: CHF (congestive heart failure) ([place]) ?No date: Coronary artery disease ?No date: Deafness in right ear ?No date: Diabetes mellitus without complication ([place]) ?No date: Dilated cardiomyopathy ([place]) ?No date: Dysrhythmia ?    Comment:  svt ?No date: Failure in dosage ?    Comment:  chronic respiratory  ?No date: GERD (gastroesophageal reflux disease) ?No date: Hyperlipidemia ?No date: Hypertension ?No date: Hypoxemia ?No date: Hypoxemia ?No date: Mild obesity ?No date: Myocardial infarction [facility]) ?    Comment:  [id number],6/25 ?No date: Pancreatitis ?No date: Sleep apnea ?    Comment:  osa ? ? Reproductive/Obstetrics ?negative OB ROS ? ?  ? ? ? ? ? ? ? ? ? ? ? ? ? ?  ?  ? ? ? ? ? ? ? ? ?Anesthesia Physical ? ?Anesthesia Plan ? ?ASA: 4 ? ?Anesthesia Plan: General  ? ?Post-op Pain Management: Minimal or no pain anticipated  ? ?Induction: Intravenous ? ?PONV Risk Score and Plan: 1 and Ondansetron, Midazolam and TIVA ? ?Airway Management Planned: Nasal CPAP ? ?Additional Equipment: None ? ?Intra-op Plan:  ? ?Post-operative Plan:  ? ?Informed Consent: I have reviewed the patients History and Physical, chart, labs and discussed the procedure including the risks,  benefits and alternatives for the proposed anesthesia with the patient or authorized representative who has indicated his/her understanding and acceptance.  ? ? ? ?Dental advisory  given ? ?Plan Discussed with: CRNA ? ?Anesthesia Plan Comments: (Discussed risks of anesthesia with patient, including possibility of difficulty with spontaneous ventilation under anesthesia necessitating airway intervention, PONV, and rare risks such as cardiac or respiratory or neurological events, and allergic reactions. Discussed the role of CRNA in patient's perioperative care. Patient understands. ?Patient counseled EXTENSIVELY on being higher risk for anesthesia due to comorbidities: severe HFrEF. Patient was told about increased risk of cardiac and respiratory events, including death. ?)  ? ? ? ? ? ? ?Anesthesia Quick Evaluation ? ?

## 2021-11-14 ENCOUNTER — Encounter (HOSPITAL_COMMUNITY): Payer: Self-pay | Admitting: Gastroenterology

## 2021-11-14 LAB — SURGICAL PATHOLOGY

## 2021-11-15 ENCOUNTER — Encounter: Payer: Self-pay | Admitting: Gastroenterology

## 2021-11-25 ENCOUNTER — Ambulatory Visit (HOSPITAL_COMMUNITY)
Admission: RE | Admit: 2021-11-25 | Discharge: 2021-11-25 | Disposition: A | Discharge status: Home / Self Care | Payer: Medicare Other | Attending: Gastroenterology | Admitting: Gastroenterology

## 2021-11-25 ENCOUNTER — Ambulatory Visit (HOSPITAL_BASED_OUTPATIENT_CLINIC_OR_DEPARTMENT_OTHER): Payer: Medicare Other | Admitting: Anesthesiology

## 2021-11-25 ENCOUNTER — Other Ambulatory Visit: Payer: Self-pay

## 2021-11-25 ENCOUNTER — Encounter (HOSPITAL_COMMUNITY)
Admission: RE | Disposition: A | Discharge status: Home / Self Care | Payer: Self-pay | Source: Home / Self Care | Attending: Gastroenterology

## 2021-11-25 ENCOUNTER — Ambulatory Visit (INDEPENDENT_AMBULATORY_CARE_PROVIDER_SITE_OTHER): Payer: Medicare Other | Admitting: Surgery

## 2021-11-25 ENCOUNTER — Encounter (HOSPITAL_COMMUNITY): Payer: Self-pay | Admitting: Gastroenterology

## 2021-11-25 ENCOUNTER — Ambulatory Visit (HOSPITAL_COMMUNITY): Payer: Medicare Other | Admitting: Anesthesiology

## 2021-11-25 ENCOUNTER — Encounter: Payer: Self-pay | Admitting: Surgery

## 2021-11-25 VITALS — BP 111/70 | HR 106 | Temp 98.4°F | Ht 66.0 in | Wt 277.6 lb

## 2021-11-25 DIAGNOSIS — K859 Acute pancreatitis without necrosis or infection, unspecified: Secondary | ICD-10-CM

## 2021-11-25 DIAGNOSIS — E119 Type 2 diabetes mellitus without complications: Secondary | ICD-10-CM | POA: Insufficient documentation

## 2021-11-25 DIAGNOSIS — I509 Heart failure, unspecified: Secondary | ICD-10-CM | POA: Diagnosis not present

## 2021-11-25 DIAGNOSIS — K222 Esophageal obstruction: Secondary | ICD-10-CM

## 2021-11-25 DIAGNOSIS — J449 Chronic obstructive pulmonary disease, unspecified: Secondary | ICD-10-CM | POA: Insufficient documentation

## 2021-11-25 DIAGNOSIS — K449 Diaphragmatic hernia without obstruction or gangrene: Secondary | ICD-10-CM | POA: Insufficient documentation

## 2021-11-25 DIAGNOSIS — K861 Other chronic pancreatitis: Secondary | ICD-10-CM | POA: Insufficient documentation

## 2021-11-25 DIAGNOSIS — I252 Old myocardial infarction: Secondary | ICD-10-CM | POA: Insufficient documentation

## 2021-11-25 DIAGNOSIS — I4891 Unspecified atrial fibrillation: Secondary | ICD-10-CM | POA: Insufficient documentation

## 2021-11-25 DIAGNOSIS — J441 Chronic obstructive pulmonary disease with (acute) exacerbation: Secondary | ICD-10-CM | POA: Diagnosis not present

## 2021-11-25 DIAGNOSIS — K2970 Gastritis, unspecified, without bleeding: Secondary | ICD-10-CM | POA: Diagnosis not present

## 2021-11-25 DIAGNOSIS — E1169 Type 2 diabetes mellitus with other specified complication: Secondary | ICD-10-CM | POA: Diagnosis not present

## 2021-11-25 DIAGNOSIS — F17210 Nicotine dependence, cigarettes, uncomplicated: Secondary | ICD-10-CM | POA: Insufficient documentation

## 2021-11-25 DIAGNOSIS — K219 Gastro-esophageal reflux disease without esophagitis: Secondary | ICD-10-CM | POA: Diagnosis not present

## 2021-11-25 DIAGNOSIS — I472 Ventricular tachycardia, unspecified: Secondary | ICD-10-CM | POA: Insufficient documentation

## 2021-11-25 DIAGNOSIS — G4730 Sleep apnea, unspecified: Secondary | ICD-10-CM | POA: Diagnosis not present

## 2021-11-25 DIAGNOSIS — Z6841 Body Mass Index (BMI) 40.0 and over, adult: Secondary | ICD-10-CM | POA: Insufficient documentation

## 2021-11-25 DIAGNOSIS — K420 Umbilical hernia with obstruction, without gangrene: Secondary | ICD-10-CM | POA: Diagnosis not present

## 2021-11-25 DIAGNOSIS — I110 Hypertensive heart disease with heart failure: Secondary | ICD-10-CM | POA: Insufficient documentation

## 2021-11-25 DIAGNOSIS — Z95810 Presence of automatic (implantable) cardiac defibrillator: Secondary | ICD-10-CM | POA: Insufficient documentation

## 2021-11-25 HISTORY — PX: ESOPHAGOGASTRODUODENOSCOPY (EGD) WITH PROPOFOL: SHX5813

## 2021-11-25 HISTORY — PX: BIOPSY: SHX5522

## 2021-11-25 HISTORY — PX: UPPER ESOPHAGEAL ENDOSCOPIC ULTRASOUND (EUS): SHX6562

## 2021-11-25 LAB — GLUCOSE, CAPILLARY: Glucose-Capillary: 152 mg/dL — ABNORMAL HIGH (ref 70–99)

## 2021-11-25 SURGERY — ESOPHAGOGASTRODUODENOSCOPY (EGD) WITH PROPOFOL
Anesthesia: Monitor Anesthesia Care

## 2021-11-25 MED ORDER — PHENYLEPHRINE HCL (PRESSORS) 10 MG/ML IV SOLN
INTRAVENOUS | Status: AC
  Filled 2021-11-25: qty 1

## 2021-11-25 MED ORDER — PROPOFOL 10 MG/ML IV BOLUS
INTRAVENOUS | Status: DC | PRN
Start: 2021-11-25 — End: 2021-11-25
  Administered 2021-11-25 (×4): 10 mg via INTRAVENOUS

## 2021-11-25 MED ORDER — PROPOFOL 500 MG/50ML IV EMUL
INTRAVENOUS | Status: DC | PRN
  Administered 2021-11-25: 75 ug/kg/min via INTRAVENOUS

## 2021-11-25 MED ORDER — EPHEDRINE SULFATE-NACL 50-0.9 MG/10ML-% IV SOSY
PREFILLED_SYRINGE | INTRAVENOUS | Status: DC | PRN
Start: 2021-11-25 — End: 2021-11-25
  Administered 2021-11-25 (×2): 5 mg via INTRAVENOUS

## 2021-11-25 MED ORDER — PROPOFOL 10 MG/ML IV BOLUS
INTRAVENOUS | Status: AC
  Filled 2021-11-25: qty 20

## 2021-11-25 MED ORDER — LACTATED RINGERS IV SOLN: INTRAVENOUS | Status: DC | PRN

## 2021-11-25 MED ORDER — PROPOFOL 1000 MG/100ML IV EMUL
INTRAVENOUS | Status: AC
  Filled 2021-11-25: qty 100

## 2021-11-25 MED ORDER — PHENYLEPHRINE 40 MCG/ML (10ML) SYRINGE FOR IV PUSH (FOR BLOOD PRESSURE SUPPORT)
PREFILLED_SYRINGE | INTRAVENOUS | Status: DC | PRN
  Administered 2021-11-25 (×2): 80 ug via INTRAVENOUS

## 2021-11-25 SURGICAL SUPPLY — 15 items

## 2021-11-25 NOTE — Anesthesia Postprocedure Evaluation (Signed)
Anesthesia Post Note ? ?Patient: [place] Gipe Leiner ? ?Procedure(s) Performed: ESOPHAGOGASTRODUODENOSCOPY (EGD) WITH PROPOFOL ?UPPER ESOPHAGEAL ENDOSCOPIC ULTRASOUND (EUS) ?BIOPSY ? ?  ? ?Patient location during evaluation: Endoscopy ?Anesthesia Type: MAC ?Level of consciousness: awake and alert, oriented and patient cooperative ?Pain management: pain level controlled ?Vital Signs Assessment: post-procedure vital signs reviewed and stable ?Respiratory status: spontaneous breathing, nonlabored ventilation and respiratory function stable ?Cardiovascular status: blood pressure returned to baseline and stable ?Postop Assessment: no apparent nausea or vomiting and able to ambulate ?Anesthetic complications: no ? ? ?No notable events documented. ? ?Last Vitals:  ?Vitals:  ? 11/25/21 0951 11/25/21 0955  ?BP: (!) 85/68 107/70  ?Pulse: 94 90  ?Resp: (!) 26 18  ?Temp:    ?SpO2: 93% 95%  ?  ?Last Pain:  ?Vitals:  ? 11/25/21 0944  ?TempSrc:   ?PainSc: Asleep  ? ? ?  ?  ?  ?  ?  ?  ? ?Travas Schexnayder,E. Shrihan Putt ? ? ? ? ?

## 2021-11-25 NOTE — Op Note (Signed)
[facility] ?Patient Name: Oscar [name] ?Procedure Date: 11/25/2021 ?MRN: [id number] ?Attending MD: Justice Britain , MD ?Date of Birth: 1976/07/30 ?CSN: [id number] ?Age: 46 ?Admit Type: Outpatient ?Procedure:                Upper EUS ?Indications:              Acute recurrent pancreatitis ?Providers:                Justice Britain, MD, Carlyn Reichert, RN, Cindee Salt  ?                          [employer], Maudry Diego, CRNA ?Referring MD:             Lin Landsman MD, MD ?Medicines:                Monitored Anesthesia Care ?Complications:            No immediate complications. ?Estimated Blood Loss:     Estimated blood loss was minimal. ?Procedure:                Pre-Anesthesia Assessment: ?                          - Prior to the procedure, a History and Physical  ?                          was performed, and patient medications and  ?                          allergies were reviewed. The patient's tolerance of  ?                          previous anesthesia was also reviewed. The risks  ?                          and benefits of the procedure and the sedation  ?                          options and risks were discussed with the patient.  ?                          All questions were answered, and informed consent  ?                          was obtained. Prior Anticoagulants: The patient has  ?                          taken no previous anticoagulant or antiplatelet  ?                          agents. ASA Grade Assessment: III - A patient with  ?                          severe systemic disease. After reviewing the risks  ?  and benefits, the patient was deemed in  ?                          satisfactory condition to undergo the procedure. ?                          After obtaining informed consent, the endoscope was  ?                          passed under direct vision. Throughout the  ?                          procedure, the patient's blood pressure,  pulse, and  ?                          oxygen saturations were monitored continuously. The  ?                          [id number]-[id number] ([id number]) Olympus endoscope was introduced  ?                          through the mouth, and advanced to the second part  ?                          of duodenum. The [id number]-[id number] ([id number]) Olympus  ?                          duodenoscope was introduced through the mouth, and  ?                          advanced to the area of papilla. The [id number]  ?                          ([id number]) Olympus linear ultrasound scope was  ?                          introduced through the mouth, and advanced to the  ?                          duodenum for ultrasound examination from the  ?                          stomach and duodenum. After obtaining informed  ?                          consent, the endoscope was passed under direct  ?                          vision. Throughout the procedure, the patient's  ?                          blood pressure, pulse, and oxygen saturations were  ?  monitored continuously.The upper EUS was  ?                          accomplished without difficulty. The patient  ?                          tolerated the procedure. ?Scope In: ?Scope Out: ?Findings: ?     ENDOSCOPIC FINDING: : ?     No gross lesions were noted in the entire esophagus. ?     A non-obstructing Schatzki ring was found at the gastroesophageal  ?     junction (40 cm). ?     A 4 cm hiatal hernia was present. ?     Patchy moderate inflammation characterized by erythema, friability and  ?     granularity was found in the entire examined stomach. Biopsies were  ?     taken with a cold forceps for histology and Helicobacter pylori testing. ?     No gross lesions were noted in the duodenal bulb, in the first portion  ?     of the duodenum and in the second portion of the duodenum. ?     The major papilla and minor papilla were normal. The major papilla was  ?     found under a hood. ?      ENDOSONOGRAPHIC FINDING: : ?     Pancreatic parenchymal abnormalities were noted in the entire pancreas.  ?     These consisted of hyperechoic foci with shadowing, lobularity with  ?     honeycombing and hyperechoic strands. ?     The pancreatic duct had a normal endosonographic appearance in the  ?     pancreatic head (0.9 mm), genu of the pancreas (1.0 mm), body of the  ?     pancreas (1.0 mm), and tail of the pancreas (1.0 mm). ?     Endosonographic imaging in the entire pancreas showed no mass-lesion. ?     There was no sign of significant endosonographic abnormality in the  ?     common bile duct (2.1 mm -> 3.2 mm) and in the common hepatic duct (3.9  ?     mm). An unremarkable gallbladder was identified. ?     Endosonographic imaging in the visualized portion of the liver showed no  ?     mass. ?     No malignant-appearing lymph nodes were visualized in the celiac region  ?     (level 20), perigastric region, peripancreatic region and porta hepatis  ?     region. ?     The celiac region was visualized. ?Impression:               EGD Impression: ?                          - No gross lesions in esophagus. Non-obstructing  ?                          Schatzki ring at 40 cm. ?                          - 4 cm hiatal hernia. ?                          -  Gastritis. Biopsied. ?                          - No gross lesions in the duodenal bulb, in the  ?                          first portion of the duodenum and in the second  ?                          portion of the duodenum. ?                          - Normal major papilla and minor papilla. Major was  ?                          hidden under a hood. ?                          EUS Impression: ?                          - Pancreatic parenchymal abnormalities consisting  ?                          of hyperechoic foci, lobularity with honeycombing  ?                          and hyperechoic strands were noted in the entire  ?                          pancreas. Patient  meets criteria for Chronic  ?                          Pancreatitis diagnosis. ?                          - The pancreatic duct had a normal endosonographic  ?                          appearance in the pancreatic head, genu of the  ?                          pancreas, body of the pancreas and tail of the  ?                          pancreas. ?                          - There was no sign of significant pathology in the  ?                          common bile duct and in the common hepatic duct. ?                          - No malignant-appearing lymph nodes were  ?  visualized in the celiac region (level 20),  ?                          perigastric region, peripancreatic region and porta  ?                          hepatis region. ?Moderate Sedation: ?     Not Applicable - Patient had care per Anesthesia. ?Recommendation:           - The patient will be observed post-procedure,  ?                          until all discharge criteria are met. ?                          - Discharge patient to home. ?                          - Patient has a contact number available for  ?                          emergencies. The signs and symptoms of potential  ?                          delayed complications were discussed with the  ?                          patient. Return to normal activities tomorrow.  ?                          Written discharge instructions were provided to the  ?                          patient. ?                          - Low fat diet. ?                          - Await path results. ?                          - Recommend referring provider send ANA, IgG4  ?                          (previously normal years ago), PRSS1, SPINK-1, CFTR  ?                          to complete Idiopathic Recurrent Pancreatitis  ?                          workup. ?                          - Recommend sending Fecal elastase. ?                          -  Consider initiation of PERT (72K units with each   ?                          meal and 36K with each snack) and may be able to  ?                          adjust based on weight-based dosing in future if he  ?                          has some improvement.

## 2021-11-25 NOTE — Anesthesia Preprocedure Evaluation (Addendum)
Anesthesia Evaluation  ?Patient identified by MRN, date of birth, ID band ?Patient awake ? ? ? ?Reviewed: ?Allergy & Precautions, NPO status , Patient's Chart, lab work & pertinent test results ? ?History of Anesthesia Complications ?Negative for: history of anesthetic complications ? ?Airway ?Mallampati: II ? ?TM Distance: >3 FB ?Neck ROM: Full ? ? ? Dental ? ?(+) Dental Advisory Given, Missing ?  ?Pulmonary ?sleep apnea, Continuous Positive Airway Pressure Ventilation and Oxygen sleep apnea , COPD,  COPD inhaler, Current Smoker and Patient abstained from smoking.,  ?  ?breath sounds clear to auscultation ? ? ? ? ? ? Cardiovascular ?hypertension, (-) angina+ Past MI and +CHF ([place])  ?(-) CAD + dysrhythmias Atrial Fibrillation and Supra Ventricular Tachycardia + pacemaker + Cardiac Defibrillator ? ?Rhythm:Regular Rate:Normal ? ?'22 ECHO:  ?1. Severely depressed LVF.  ?2. LV EF <20%. The LV has severely decreased function, ?global hypokinesis. The left ventricular internal cavity size was severely dilated. There is moderate LVH. Grade I diastolic dysfunction (impaired relaxation).  ??3. RVF is moderately reduced. The RV size is moderately enlarged, Mildly increased RV wall thickness. There is moderately elevated pulmonary artery systolic pressure.  ??4. Left atrial size was mild to moderately dilated.  ??5. Right atrial size was mild to moderately dilated.  ??6. The mitral valve is grossly normal. Mild MR.  ??7. TR is moderate to severe.  ??8. The aortic valve is grossly normal. AI is trivial. Mild aortic valve sclerosis is present, with no evidence of AS.    ?.  ??'16 LHC: normal coronary anatomy  ?  ?Neuro/Psych ?negative neurological ROS ?   ? GI/Hepatic ?Neg liver ROS, GERD  Medicated and Controlled,  ?Endo/Other  ?diabetes (glu 152)Morbid obesity ? Renal/GU ?Renal InsufficiencyRenal disease  ? ?  ?Musculoskeletal ? ? Abdominal ?(+) + obese,   ?Peds ? Hematology ?negative  hematology ROS ?(+)   ?Anesthesia Other Findings ? ? Reproductive/Obstetrics ? ?  ? ? ? ? ? ? ? ? ? ? ? ? ? ?  ?  ? ? ? ? ? ? ?Anesthesia Physical ?Anesthesia Plan ? ?ASA: 4 ? ?Anesthesia Plan: MAC  ? ?Post-op Pain Management: Minimal or no pain anticipated  ? ?Induction:  ? ?PONV Risk Score and Plan: 0 ? ?Airway Management Planned: Natural Airway and Nasal Cannula ? ?Additional Equipment: None ? ?Intra-op Plan:  ? ?Post-operative Plan:  ? ?Informed Consent: I have reviewed the patients History and Physical, chart, labs and discussed the procedure including the risks, benefits and alternatives for the proposed anesthesia with the patient or authorized representative who has indicated his/her understanding and acceptance.  ? ? ? ?Dental advisory given ? ?Plan Discussed with: CRNA and Surgeon ? ?Anesthesia Plan Comments:   ? ? ? ? ? ?Anesthesia Quick Evaluation ? ?

## 2021-11-25 NOTE — Patient Instructions (Addendum)
If you have any concerns or questions, please feel free to call our office. See follow up appointment below.  ? ? ?Umbilical Hernia, Adult ?A hernia is a bulge of tissue that pushes through an opening between muscles. An umbilical hernia happens in the abdomen, near the belly button (umbilicus). The hernia may contain tissues from the small intestine, large intestine, or fatty tissue covering the intestines. Umbilical hernias in adults tend to get worse over time, and they require surgical treatment. ?There are different types of umbilical hernias, including: ?Indirect hernia. This type is located just above or below the umbilicus. It is the most common type of umbilical hernia in adults. ?Direct hernia. This type forms through an opening formed by the umbilicus. ?Reducible hernia. This type of hernia comes and goes. It may be visible only when you strain, lift something heavy, or cough. This type of hernia can be pushed back into the abdomen (reduced). ?Incarcerated hernia. This type traps abdominal tissue inside the hernia. This type of hernia cannot be reduced. ?Strangulated hernia. This type of hernia cuts off blood flow to the tissues inside the hernia. The tissues can start to die if this happens. This type of hernia requires emergency treatment. ?What are the causes? ?An umbilical hernia happens when tissue inside the abdomen presses on a weak area of the abdominal muscles. ?What increases the risk? ?You may have a greater risk of this condition if you: ?Are obese. ?Have had several pregnancies. ?Have a buildup of fluid inside your abdomen. ?Have had surgery that weakens the abdominal muscles. ?What are the signs or symptoms? ?The main symptom of this condition is a painless bulge at or near the belly button. ?A reducible hernia may be visible only when you strain, lift something heavy, or cough. Other symptoms may include: ?Dull pain. ?A feeling of pressure. ?Symptoms of a strangulated hernia may  include: ?Pain that gets increasingly worse. ?Nausea and vomiting. ?Pain when pressing on the hernia. ?Skin over the hernia becoming red or purple. ?Constipation. ?Blood in the stool. ?How is this diagnosed? ?This condition may be diagnosed based on: ?A physical exam. You may be asked to cough or strain while standing. These actions increase the pressure inside your abdomen and can force the hernia through the opening in your muscles. Your health care provider may try to reduce the hernia by pressing on it. ?Your symptoms and medical history. ?How is this treated? ?Surgery is the only treatment for an umbilical hernia. Surgery for a strangulated hernia is done as soon as possible. If you have a small hernia that is not incarcerated, you may need to lose weight before having surgery. ?Follow these instructions at home: ?Lose weight, if told by your health care provider. ?Do not try to push the hernia back in. ?Watch your hernia for any changes in color or size. Tell your health care provider if any changes occur. ?You may need to avoid activities that increase pressure on your hernia. ?Do not lift anything that is heavier than 10 lb (4.5 kg), or the limit that you are told, until your health care provider says that it is safe. ?Take over-the-counter and prescription medicines only as told by your health care provider. ?Keep all follow-up visits. This is important. ?Contact a health care provider if: ?Your hernia gets larger. ?Your hernia becomes painful. ?Get help right away if: ?You develop sudden, severe pain near the area of your hernia. ?You have pain as well as nausea or vomiting. ?You have pain  and the skin over your hernia changes color. ?You develop a fever or chills. ?Summary ?A hernia is a bulge of tissue that pushes through an opening between muscles. An umbilical hernia happens near the belly button. ?Surgery is the only treatment for an umbilical hernia. ?Do not try to push your hernia back in. ?Keep all  follow-up visits. This is important. ?This information is not intended to replace advice given to you by your health care provider. Make sure you discuss any questions you have with your health care provider. ?Document Revised: 03/26/2020 Document Reviewed: 03/26/2020 ?Elsevier Patient Education ? [place]. ? ?

## 2021-11-25 NOTE — Discharge Instructions (Signed)
YOU HAD AN ENDOSCOPIC PROCEDURE TODAY: Refer to the procedure report and other information in the discharge instructions given to you for any specific questions about what was found during the examination. If this information does not answer your questions, please call [place] office at [phone] to clarify.  ° °YOU SHOULD EXPECT: Some feelings of bloating in the abdomen. Passage of more gas than usual. Walking can help get rid of the air that was put into your GI tract during the procedure and reduce the bloating. If you had a lower endoscopy (such as a colonoscopy or flexible sigmoidoscopy) you may notice spotting of blood in your stool or on the toilet paper. Some abdominal soreness may be present for a day or two, also. ° °DIET: Your first meal following the procedure should be a light meal and then it is ok to progress to your normal diet. A half-sandwich or bowl of soup is an example of a good first meal. Heavy or fried foods are harder to digest and may make you feel nauseous or bloated. Drink plenty of fluids but you should avoid alcoholic beverages for 24 hours. If you had a esophageal dilation, please see attached instructions for diet.   ° °ACTIVITY: Your care partner should take you home directly after the procedure. You should plan to take it easy, moving slowly for the rest of the day. You can resume normal activity the day after the procedure however YOU SHOULD NOT DRIVE, use power tools, machinery or perform tasks that involve climbing or major physical exertion for 24 hours (because of the sedation medicines used during the test).  ° °SYMPTOMS TO REPORT IMMEDIATELY: °A gastroenterologist can be reached at any hour. Please call [phone]  for any of the following symptoms:  °Following lower endoscopy (colonoscopy, flexible sigmoidoscopy) °Excessive amounts of blood in the stool  °Significant tenderness, worsening of abdominal pains  °Swelling of the abdomen that is new, acute  °Fever of 100° or  higher  °Following upper endoscopy (EGD, EUS, ERCP, esophageal dilation) °Vomiting of blood or coffee ground material  °New, significant abdominal pain  °New, significant chest pain or pain under the shoulder blades  °Painful or persistently difficult swallowing  °New shortness of breath  °Black, tarry-looking or red, bloody stools ° °FOLLOW UP:  °If any biopsies were taken you will be contacted by phone or by letter within the next 1-3 weeks. Call [phone]  if you have not heard about the biopsies in 3 weeks.  °Please also call with any specific questions about appointments or follow up tests. ° °

## 2021-11-25 NOTE — H&P (Addendum)
? ?GASTROENTEROLOGY PROCEDURE H&P NOTE  ? ?Primary Care Physician: ?[facility] ? ?HPI: ?Oscar [name] is a 46 y.o. male who presents for EGD/EUS to evaluate for recurrent idiopathic pancreatitis. ? ?Past Medical History:  ?Diagnosis Date  ? AICD (automatic cardioverter/defibrillator) present   ? Asthma   ? Cardiomyopathy ([place])   ? CHF (congestive heart failure) ([place])   ? Coronary artery disease   ? Deafness in right ear   ? Diabetes mellitus without complication ([place])   ? Dilated cardiomyopathy ([place])   ? Dysrhythmia   ? svt  ? Failure in dosage   ? chronic respiratory   ? GERD (gastroesophageal reflux disease)   ? Hyperlipidemia   ? Hypertension   ? Hypoxemia   ? Hypoxemia   ? Mild obesity   ? Myocardial infarction [facility])   ? 1937,9024,0/97  ? Pancreatitis   ? Sleep apnea   ? osa  ? ?Past Surgical History:  ?Procedure Laterality Date  ? CARDIAC CATHETERIZATION  12/11/2014  ? Procedure: RIGHT/LEFT HEART CATH AND CORONARY ANGIOGRAPHY;  Surgeon: Lorretta Harp, MD;  [facility] CATH LAB;  Service: Cardiovascular;;  ? COLONOSCOPY WITH PROPOFOL N/A 11/13/2021  ? Procedure: COLONOSCOPY WITH PROPOFOL;  Surgeon: Lin Landsman, MD;  Location: [facility] ENDOSCOPY;  Service: Gastroenterology;  Laterality: N/A;  ? ICD LEAD REMOVAL N/A 03/30/2015  ? Procedure: ICD LEAD REMOVAL;  Surgeon: Marzetta Board, MD;  Location: [facility] ORS;  Service: Cardiovascular;  Laterality: N/A;  ? IMPLANTABLE CARDIOVERTER DEFIBRILLATOR IMPLANT    ? INCISION AND DRAINAGE ABSCESS N/A 12/12/2020  ? Procedure: INCISION AND DRAINAGE ABSCESS;  Surgeon: Jules Husbands, MD;  Location: [facility] ORS;  Service: General;  Laterality: N/A;  ? INSERT / REPLACE / REMOVE PACEMAKER    ? LEFT HEART CATHETERIZATION WITH CORONARY ANGIOGRAM N/A 12/09/2014  ? Procedure: LEFT HEART CATHETERIZATION WITH CORONARY ANGIOGRAM;  Surgeon: Burnell Blanks, MD;  Location: [facility] CATH LAB;  Service: Cardiovascular;  Laterality: N/A;  ? ?No current  facility-administered medications for this encounter.  ? ?No current facility-administered medications for this encounter. ?Allergies  ?Allergen Reactions  ? Ciprofloxacin Swelling and Other (See Comments)  ?  Migraine Headache ?  ? Iodinated Contrast Media   ?  Other reaction(s): Vomiting ?Projectile vomiting  ? Isosorb Dinitrate-Hydralazine Other (See Comments)  ?  Migraine Headache  ? ?Family History  ?Problem Relation Age of Onset  ? Hypertension Mother   ? Congestive Heart Failure Mother   ? Hypertension Sister   ? Diabetes Sister   ? Pancreatitis Sister   ? COPD Sister   ? Pancreatitis Brother   ? Anemia Neg Hx   ? Arrhythmia Neg Hx   ? Asthma Neg Hx   ? Clotting disorder Neg Hx   ? Fainting Neg Hx   ? Heart attack Neg Hx   ? Heart disease Neg Hx   ? Heart failure Neg Hx   ? Hyperlipidemia Neg Hx   ? ?Social History  ? ?Socioeconomic History  ? Marital status: Single  ?  Spouse name: Not on file  ? Number of children: Not on file  ? Years of education: Not on file  ? Highest education level: Not on file  ?Occupational History  ? Occupation: unemployed  ?Tobacco Use  ? Smoking status: Every Day  ?  Packs/day: 0.50  ?  Years: 24.00  ?  Pack years: 12.00  ?  Types: Cigarettes  ? Smokeless tobacco: Never  ? Tobacco comments:  ?  4/5 Smoking 5 cigs a day  ?Vaping Use  ? Vaping Use: Never used  ?Substance and Sexual Activity  ? Alcohol use: No  ? Drug use: No  ? Sexual activity: Yes  ?Other Topics Concern  ? Not on file  ?Social History Narrative  ? Not on file  ? ?Social Determinants of Health  ? ?Financial Resource Strain: Not on file  ?Food Insecurity: Not on file  ?Transportation Needs: Not on file  ?Physical Activity: Not on file  ?Stress: Not on file  ?Social Connections: Not on file  ?Intimate Partner Violence: Not on file  ? ? ?Physical Exam: ?Today's Vitals  ? 11/25/21 0824  ?BP: 112/82  ?Pulse: 94  ?Resp: 12  ?Temp: 98 ?F (36.7 ?C)  ?TempSrc: Oral  ?SpO2: 97%  ?Weight: 120.2 kg  ?Height: '5\' 6"'$  (1.676 m)   ?PainSc: 0-No pain  ? ?Body mass index is 42.77 kg/m?. ?GEN: NAD ?EYE: Sclerae anicteric ?ENT: MMM ?CV: Non-tachycardic ?GI: Soft, NT/ND ?NEURO:  Alert & Oriented x 3 ? ?Lab Results: ?No results for input(s): WBC, HGB, HCT, PLT in the last 72 hours. ?BMET ?No results for input(s): NA, K, CL, CO2, GLUCOSE, BUN, CREATININE, CALCIUM in the last 72 hours. ?LFT ?No results for input(s): PROT, ALBUMIN, AST, ALT, ALKPHOS, BILITOT, BILIDIR, IBILI in the last 72 hours. ?PT/INR ?No results for input(s): LABPROT, INR in the last 72 hours. ? ? ?Impression / Plan: ?This is a 46 y.o.male who presents for EGD/EUS to evaluate for recurrent idiopathic pancreatitis. ? ?The risks of an EUS including intestinal perforation, bleeding, infection, aspiration, and medication effects were discussed as was the possibility it may not give a definitive diagnosis if a biopsy is performed.  When a biopsy of the pancreas is done as part of the EUS, there is an additional risk of pancreatitis at the rate of about 1-2%.  It was explained that procedure related pancreatitis is typically mild, although it can be severe and even life threatening, which is why we do not perform random pancreatic biopsies and only biopsy a lesion/area we feel is concerning enough to warrant the risk. ? ? ?The risks and benefits of endoscopic evaluation/treatment were discussed with the patient and/or family; these include but are not limited to the risk of perforation, infection, bleeding, missed lesions, lack of diagnosis, severe illness requiring hospitalization, as well as anesthesia and sedation related illnesses.  The patient's history has been reviewed, patient examined, no change in status, and deemed stable for procedure.  The patient and/or family is agreeable to proceed.  ? ? ?Justice Britain, MD ?[facility] ?Advanced Endoscopy ?Office # [phone] ? ? ?

## 2021-11-25 NOTE — Transfer of Care (Signed)
Immediate Anesthesia Transfer of Care Note ? ?Patient: Oscar [name] ? ?Procedure(s) Performed: ESOPHAGOGASTRODUODENOSCOPY (EGD) WITH PROPOFOL ?UPPER ESOPHAGEAL ENDOSCOPIC ULTRASOUND (EUS) ?BIOPSY ? ?Patient Location: PACU ? ?Anesthesia Type:MAC ? ?Level of Consciousness: drowsy ? ?Airway & Oxygen Therapy: Patient Spontanous Breathing and Patient connected to face mask oxygen ? ?Post-op Assessment: Report given to RN, Post -op Vital signs reviewed and stable and Patient moving all extremities X 4 ? ?Post vital signs: Reviewed and stable ? ?Last Vitals:  ?Vitals Value Taken Time  ?BP 102/61   ?Temp    ?Pulse 99 11/25/21 0942  ?Resp 24 11/25/21 0942  ?SpO2 95 % 11/25/21 0942  ?Vitals shown include unvalidated device data. ? ?Last Pain:  ?Vitals:  ? 11/25/21 0824  ?TempSrc: Oral  ?PainSc: 0-No pain  ?   ? ?  ? ?Complications: No notable events documented. ?

## 2021-11-25 NOTE — Anesthesia Procedure Notes (Signed)
Procedure Name: [place] ?Date/Time: 11/25/2021 8:56 AM ?Performed by: Niel Hummer, CRNA ?Pre-anesthesia Checklist: Patient identified, Emergency Drugs available, Suction available and Patient being monitored ?Oxygen Delivery Method: Simple face mask ? ? ? ? ?

## 2021-11-26 ENCOUNTER — Encounter: Payer: Self-pay | Admitting: Gastroenterology

## 2021-11-26 ENCOUNTER — Encounter: Payer: Self-pay | Admitting: Podiatry

## 2021-11-26 ENCOUNTER — Ambulatory Visit (INDEPENDENT_AMBULATORY_CARE_PROVIDER_SITE_OTHER): Payer: Medicare Other | Admitting: Podiatry

## 2021-11-26 ENCOUNTER — Other Ambulatory Visit: Payer: Self-pay

## 2021-11-26 ENCOUNTER — Other Ambulatory Visit: Payer: Self-pay | Admitting: Gastroenterology

## 2021-11-26 DIAGNOSIS — E119 Type 2 diabetes mellitus without complications: Secondary | ICD-10-CM | POA: Diagnosis not present

## 2021-11-26 DIAGNOSIS — M79676 Pain in unspecified toe(s): Secondary | ICD-10-CM

## 2021-11-26 DIAGNOSIS — K861 Other chronic pancreatitis: Secondary | ICD-10-CM

## 2021-11-26 DIAGNOSIS — B351 Tinea unguium: Secondary | ICD-10-CM | POA: Diagnosis not present

## 2021-11-26 LAB — SURGICAL PATHOLOGY

## 2021-11-26 NOTE — Progress Notes (Signed)
? ?  SUBJECTIVE ?Patient with a history of diabetes mellitus presents to office today complaining of elongated, thickened nails that cause pain while ambulating in shoes.  Patient is unable to trim their own nails. Patient is here for further evaluation and treatment. ? ? ?Past Medical History:  ?Diagnosis Date  ? AICD (automatic cardioverter/defibrillator) present   ? Asthma   ? Cardiomyopathy ([place])   ? CHF (congestive heart failure) ([place])   ? Coronary artery disease   ? Deafness in right ear   ? Diabetes mellitus without complication ([place])   ? Dilated cardiomyopathy ([place])   ? Dysrhythmia   ? svt  ? Failure in dosage   ? chronic respiratory   ? GERD (gastroesophageal reflux disease)   ? Hyperlipidemia   ? Hypertension   ? Hypoxemia   ? Hypoxemia   ? Mild obesity   ? Myocardial infarction [facility])   ? 5038,8828,0/03  ? Pancreatitis   ? Sleep apnea   ? osa  ? ? ?OBJECTIVE ?General Patient is awake, alert, and oriented x 3 and in no acute distress. ?Derm Skin is dry and supple bilateral. Negative open lesions or macerations. Remaining integument unremarkable. Nails are tender, long, thickened and dystrophic with subungual debris, consistent with onychomycosis, 1-5 bilateral. No signs of infection noted. ?Vasc  DP and PT pedal pulses palpable bilaterally. Temperature gradient within normal limits.  ?Neuro Epicritic and protective threshold sensation diminished bilaterally.  ?Musculoskeletal Exam No symptomatic pedal deformities noted bilateral. Muscular strength within normal limits. ? ?ASSESSMENT ?1. Diabetes Mellitus w/ peripheral neuropathy ?2.  Pain due to onychomycosis of toenails bilateral ? ?PLAN OF CARE ?1. Patient evaluated today.  Comprehensive diabetic foot exam performed today ?2. Instructed to maintain good pedal hygiene and foot care. Stressed importance of controlling blood sugar.  ?3. Mechanical debridement of nails 1-5 bilaterally performed using a nail nipper. Filed with dremel without incident.  ?4.  Return to clinic in 3 mos.  ? ? ? ?Edrick Kins, DPM ?[place] ? ?Dr. Edrick Kins, DPM  ?  ?[address]                                      ?[place], [place] [address]                ?Office [phone]  ?Fax [phone] ? ? ? ? ? ?

## 2021-11-27 ENCOUNTER — Telehealth: Payer: Self-pay

## 2021-11-27 DIAGNOSIS — G8929 Other chronic pain: Secondary | ICD-10-CM

## 2021-11-27 DIAGNOSIS — K861 Other chronic pancreatitis: Secondary | ICD-10-CM

## 2021-11-27 NOTE — Telephone Encounter (Signed)
Released labs and order the pancreatic fecal elastase levels. Patient verbalized understanding of results  and he will come for labs  ?

## 2021-11-27 NOTE — Telephone Encounter (Signed)
-----   Message from Lin Landsman, MD sent at 11/26/2021  4:42 PM EDT ----- ?Regarding: Labs ?Caryl Pina ? ?Please inform patient that I received a copy of endoscopic ultrasound results from Dr. Rush Landmark regarding chronic pancreatitis.  Per his recommendation, I ordered some blood test.  Please inform patient about the blood tests and release the labs ?Also, please order pancreatic fecal elastase levels ? ?Thanks ?RV ? ?

## 2021-11-29 ENCOUNTER — Encounter: Payer: Self-pay | Admitting: Surgery

## 2021-11-29 NOTE — Progress Notes (Signed)
Outpatient Surgical Follow Up ? ?11/29/2021 ? ?[name] is an 46 y.o. male.  ? ?Chief Complaint  ?Patient presents with  ? New Patient (Initial Visit)  ?  Umbilical hernia  ? ? ?HPI:  He does have a large symptomatic ventral hernia.  Reports intermittent abdominal pain and a bulge.  The pain is moderate intensity worsening with Valsalva.  No fevers no chills.  He does have significant comorbidities to include a BMI of 44.8 , COPD, coronary artery disease, CHF with an ejection fraction of 15%.,  He does have a history of pancreatitis and prior MIs. ?Just  underwent an endoscopic ultrasound that have personally reviewed showing evidence of normal pancreas.  No cbd stones or masses.  ?Currently he seems to be compensated and is able to walk.  He does have some dyspnea on exertion.  Denies any angina. ?He did have a CT scan that I personally reviewed showing evidence of ventral hernia. He had gallbladder sludge at some point in time. ?He has seen Dr. [name] with from cardiology, he is on Xarelto, EF is less than 20%. ? ? ? ?Past Medical History:  ?Diagnosis Date  ? AICD (automatic cardioverter/defibrillator) present   ? Asthma   ? Cardiomyopathy ([place])   ? CHF (congestive heart failure) ([place])   ? Coronary artery disease   ? Deafness in right ear   ? Diabetes mellitus without complication ([place])   ? Dilated cardiomyopathy ([place])   ? Dysrhythmia   ? svt  ? Failure in dosage   ? chronic respiratory   ? GERD (gastroesophageal reflux disease)   ? Hyperlipidemia   ? Hypertension   ? Hypoxemia   ? Hypoxemia   ? Mild obesity   ? Myocardial infarction [facility])   ? 6720,9470,9/62  ? Pancreatitis   ? Sleep apnea   ? osa  ? ? ?Past Surgical History:  ?Procedure Laterality Date  ? BIOPSY  11/25/2021  ? Procedure: BIOPSY;  Surgeon: Irving Copas., MD;  Location: [facility];  Service: Gastroenterology;;  ? CARDIAC CATHETERIZATION  12/11/2014  ? Procedure: RIGHT/LEFT HEART CATH AND CORONARY ANGIOGRAPHY;  Surgeon: Lorretta Harp, MD;  Location: [facility] CATH LAB;  Service: Cardiovascular;;  ? COLONOSCOPY WITH PROPOFOL N/A 11/13/2021  ? Procedure: COLONOSCOPY WITH PROPOFOL;  Surgeon: Lin Landsman, MD;  Location: [facility] ENDOSCOPY;  Service: Gastroenterology;  Laterality: N/A;  ? ESOPHAGOGASTRODUODENOSCOPY (EGD) WITH PROPOFOL N/A 11/25/2021  ? Procedure: ESOPHAGOGASTRODUODENOSCOPY (EGD) WITH PROPOFOL;  Surgeon: Rush Landmark Telford Nab., MD;  Location: [facility];  Service: Gastroenterology;  Laterality: N/A;  ? ICD LEAD REMOVAL N/A 03/30/2015  ? Procedure: ICD LEAD REMOVAL;  Surgeon: Marzetta Board, MD;  Location: [facility] ORS;  Service: Cardiovascular;  Laterality: N/A;  ? IMPLANTABLE CARDIOVERTER DEFIBRILLATOR IMPLANT    ? INCISION AND DRAINAGE ABSCESS N/A 12/12/2020  ? Procedure: INCISION AND DRAINAGE ABSCESS;  Surgeon: Jules Husbands, MD;  Location: [facility] ORS;  Service: General;  Laterality: N/A;  ? INSERT / REPLACE / REMOVE PACEMAKER    ? LEFT HEART CATHETERIZATION WITH CORONARY ANGIOGRAM N/A 12/09/2014  ? Procedure: LEFT HEART CATHETERIZATION WITH CORONARY ANGIOGRAM;  Surgeon: Burnell Blanks, MD;  Location: [facility] CATH LAB;  Service: Cardiovascular;  Laterality: N/A;  ? UPPER ESOPHAGEAL ENDOSCOPIC ULTRASOUND (EUS) N/A 11/25/2021  ? Procedure: UPPER ESOPHAGEAL ENDOSCOPIC ULTRASOUND (EUS);  Surgeon: Irving Copas., MD;  Location: [facility];  Service: Gastroenterology;  Laterality: N/A;  ? ? ?Family History  ?Problem Relation Age of Onset  ? Hypertension  Mother   ? Congestive Heart Failure Mother   ? Hypertension Sister   ? Diabetes Sister   ? Pancreatitis Sister   ? COPD Sister   ? Pancreatitis Brother   ? Anemia Neg Hx   ? Arrhythmia Neg Hx   ? Asthma Neg Hx   ? Clotting disorder Neg Hx   ? Fainting Neg Hx   ? Heart attack Neg Hx   ? Heart disease Neg Hx   ? Heart failure Neg Hx   ? Hyperlipidemia Neg Hx   ? ? ?Social History:  reports that he has been smoking cigarettes. He has a 12.00 pack-year smoking history. He has never  used smokeless tobacco. He reports that he does not drink alcohol and does not use drugs. ? ?Allergies:  ?Allergies  ?Allergen Reactions  ? Ciprofloxacin Swelling and Other (See Comments)  ?  Migraine Headache ?  ? Iodinated Contrast Media   ?  Other reaction(s): Vomiting ?Projectile vomiting  ? Isosorb Dinitrate-Hydralazine Other (See Comments)  ?  Migraine Headache  ? ? ?Medications reviewed. ? ? ? ?ROS ?Full ROS performed and is otherwise negative other than what is stated in HPI ? ? ?BP 111/70   Pulse (!) 106   Temp 98.4 ?F (36.9 ?C) (Oral)   Ht '5\' 6"'$  (1.676 m)   Wt 277 lb 9.6 oz (125.9 kg)   SpO2 95%   BMI 44.81 kg/m?  ? ?Physical Exam ?Physical Exam ?CONSTITUTIONAL: NAD BMI 44.8. ?EYES: Pupils are equal, round,, Sclera are non-icteric. ?EARS, NOSE, MOUTH AND THROAT: he is wearing a mask Hearing is intact to voice. ?LYMPH NODES:  Lymph nodes in the neck are normal. ?RESPIRATORY:   There is normal respiratory effort, with equal breath sounds bilaterally, and without pathologic use of accessory muscles. ?CARDIOVASCULAR: Heart is regular without murmurs, gallops, or rubs. ?GI: The abdomen is  soft, chronically incarcerated ventral hernia measures approximately 4 cm there are no palpable masses. There is no hepatosplenomegaly. There are normal bowel sounds  ?GU: Rectal deferred.   ?MUSCULOSKELETAL: Normal muscle strength and tone. No cyanosis or edema.   ?SKIN: Turgor is good and there are no pathologic skin lesions or ulcers. ?NEUROLOGIC: Motor and sensation is grossly normal. Cranial nerves are grossly intact. ?PSYCH:  Oriented to person, place and time. Affect is normal. ? ? 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  ?He underwent an the scopic ultrasound and GI thinks that cholecystectomy may been of benefit given his pancreatitis. ? Discussed with the patient in detail.  I do  think at some point in time we will need to perform both procedure but I will like to make sure his diabetes is better controlled.  Encouraged again the patient to lose as much weight as possible.  I suspect that we may not have enough time to optimize him from a weight perspective because his symptoms continue to worsen.  On the other hand if possible we will try to modify all potential risk factors to have a better outcome.  I was very candid with him about my thought process and he is in agreement.  He also is hesitant because he has significant symptoms and does not want to make this into a worse situation that he will require emergent intervention. ?Note that I spent more than 40 minutes in this encounter including personally reviewing imaging studies, coordination of his care, placing orders and performing appropriate documentation ? ? ?  Caroleen Hamman, MD FACS ?General Surgeon  ?

## 2021-12-12 ENCOUNTER — Institutional Professional Consult (permissible substitution): Payer: Medicare Other | Admitting: Pulmonary Disease

## 2021-12-12 ENCOUNTER — Ambulatory Visit: Payer: Medicare Other | Admitting: Podiatry

## 2021-12-14 NOTE — Progress Notes (Signed)
? Patient ID: Oscar [name], male    DOB: [birth year], 46 y.o.   MRN: [id number] ? ?Oscar [name] is a 46 y/o male with a history of obstructive sleep apnea (currently with CPAP), MI, HTN, hyperlipidemia, GERD, DM, SVT, asthma and chronic heart failure. ? ?Echo report from 11/23/20 reviewed and showed an EF of <20% along with moderate LVH and moderately elevated PA pressure. Echo done 02/27/20 reviewed and showed an EF of <20%. Echo done 03/01/2019 which showed an EF of 15% along with mild Oscar. Echo done 06/23/18 reviewed and showed an EF of 20-25% along with mild Oscar. Echo done 02/06/15 which showed an EF of 25% which is unchanged from previous echo on 12/10/14 which showed an EF of 25% and mild Oscar.  ? ?Admitted 10/03/21 due to pancreatitis.  ? ?He presents today for a follow-up visit with a chief complaint of moderate fatigue upon minimal exertion. He describes this as chronic in nature having been present for several years. He has associated decreased appetite, cough, shortness of breath, palpitations, abdominal pain, nausea and chronic back pain along with this. He denies any difficulty sleeping, abdominal distention, pedal edema, chest pain, wheezing, dizziness or weight gain.  ? ?Has had colonoscopy/ endoscopy since last here. Says that the surgeon wants him to get his A1c lower prior to doing hernia repair. Patient says that he had stopped taking his medications because he was so sick all the time but has resumed his medications.   ? ?Past Medical History:  ?Diagnosis Date  ? AICD (automatic cardioverter/defibrillator) present   ? Asthma   ? Cardiomyopathy ([place])   ? CHF (congestive heart failure) ([place])   ? Coronary artery disease   ? Deafness in right ear   ? Diabetes mellitus without complication ([place])   ? Dilated cardiomyopathy ([place])   ? Dysrhythmia   ? svt  ? Failure in dosage   ? chronic respiratory   ? GERD (gastroesophageal reflux disease)   ? Hyperlipidemia   ? Hypertension   ? Hypoxemia   ? Hypoxemia   ? Mild  obesity   ? Myocardial infarction [facility])   ? 6283,6629,4/76  ? Pancreatitis   ? Sleep apnea   ? osa  ? ?Past Surgical History:  ?Procedure Laterality Date  ? BIOPSY  11/25/2021  ? Procedure: BIOPSY;  Surgeon: Irving Copas., MD;  Location: [facility];  Service: Gastroenterology;;  ? CARDIAC CATHETERIZATION  12/11/2014  ? Procedure: RIGHT/LEFT HEART CATH AND CORONARY ANGIOGRAPHY;  Surgeon: Lorretta Harp, MD;  Location: [facility] CATH LAB;  Service: Cardiovascular;;  ? COLONOSCOPY WITH PROPOFOL N/A 11/13/2021  ? Procedure: COLONOSCOPY WITH PROPOFOL;  Surgeon: Lin Landsman, MD;  [facility] ENDOSCOPY;  Service: Gastroenterology;  Laterality: N/A;  ? ESOPHAGOGASTRODUODENOSCOPY (EGD) WITH PROPOFOL N/A 11/25/2021  ? Procedure: ESOPHAGOGASTRODUODENOSCOPY (EGD) WITH PROPOFOL;  Surgeon: Rush Landmark Telford Nab., MD;  Location: [facility];  Service: Gastroenterology;  Laterality: N/A;  ? ICD LEAD REMOVAL N/A 03/30/2015  ? Procedure: ICD LEAD REMOVAL;  Surgeon: Marzetta Board, MD;  Location: [facility] ORS;  Service: Cardiovascular;  Laterality: N/A;  ? IMPLANTABLE CARDIOVERTER DEFIBRILLATOR IMPLANT    ? INCISION AND DRAINAGE ABSCESS N/A 12/12/2020  ? Procedure: INCISION AND DRAINAGE ABSCESS;  Surgeon: Jules Husbands, MD;  Location: [facility] ORS;  Service: General;  Laterality: N/A;  ? INSERT / REPLACE / REMOVE PACEMAKER    ? LEFT HEART CATHETERIZATION WITH CORONARY ANGIOGRAM N/A 12/09/2014  ? Procedure: LEFT HEART CATHETERIZATION WITH CORONARY ANGIOGRAM;  Surgeon:  Burnell Blanks, MD;  Location: [facility] CATH LAB;  Service: Cardiovascular;  Laterality: N/A;  ? UPPER ESOPHAGEAL ENDOSCOPIC ULTRASOUND (EUS) N/A 11/25/2021  ? Procedure: UPPER ESOPHAGEAL ENDOSCOPIC ULTRASOUND (EUS);  Surgeon: Irving Copas., MD;  Location: [facility];  Service: Gastroenterology;  Laterality: N/A;  ? ?Family History  ?Problem Relation Age of Onset  ? Hypertension Mother   ? Congestive Heart Failure Mother   ? Hypertension Sister   ?  Diabetes Sister   ? Pancreatitis Sister   ? COPD Sister   ? Pancreatitis Brother   ? Anemia Neg Hx   ? Arrhythmia Neg Hx   ? Asthma Neg Hx   ? Clotting disorder Neg Hx   ? Fainting Neg Hx   ? Heart attack Neg Hx   ? Heart disease Neg Hx   ? Heart failure Neg Hx   ? Hyperlipidemia Neg Hx   ? ?Social History  ? ?Tobacco Use  ? Smoking status: Every Day  ?  Packs/day: 0.50  ?  Years: 24.00  ?  Pack years: 12.00  ?  Types: Cigarettes  ? Smokeless tobacco: Never  ? Tobacco comments:  ?  4/5 Smoking 5 cigs a day  ?Substance Use Topics  ? Alcohol use: No  ? ?Allergies  ?Allergen Reactions  ? Ciprofloxacin Swelling and Other (See Comments)  ?  Migraine Headache ?  ? Iodinated Contrast Media   ?  Other reaction(s): Vomiting ?Projectile vomiting  ? Isosorb Dinitrate-Hydralazine Other (See Comments)  ?  Migraine Headache  ? ?Prior to Admission medications   ?Medication Sig Start Date End Date Taking? Authorizing Provider  ?acetaminophen (TYLENOL) 500 MG tablet Take 1,000 mg by mouth every 6 (six) hours as needed for moderate pain or mild pain.   Yes [provider]  ?allopurinol (ZYLOPRIM) 300 MG tablet Take 300 mg by mouth daily. 10/15/21  Yes [provider]  ?canagliflozin (INVOKANA) 100 MG TABS tablet Take 100 mg by mouth every morning. 10/02/21  Yes [provider]  ?clindamycin (CLEOCIN-T) 1 % lotion Apply to affected areas daily after shower. 06/11/21  Yes [name], MD  ?colchicine 0.6 MG tablet Take 0.6 mg by mouth daily. 11/04/21  Yes [provider]  ?ketoconazole (NIZORAL) 2 % cream Apply twice daily to neck until clear 06/11/21  Yes [name], MD  ?ondansetron (ZOFRAN) 4 MG tablet Take 1 tablet (4 mg total) by mouth every 6 (six) hours as needed for nausea or vomiting. 10/31/21  Yes Vanga, Tally Due, MD  ?pantoprazole (PROTONIX) 40 MG tablet Take 40 mg by mouth 2 (two) times daily. 08/22/21 08/22/22 Yes [provider]  ?sacubitril-valsartan (ENTRESTO) 49-51 MG Take  0.5 tablets by mouth 2 (two) times daily.   Yes [provider]  ?tazarotene (AVAGE) 0.1 % cream Apply topically at bedtime. 06/11/21  Yes [name], MD  ?torsemide 40 MG TABS Take 40 mg by mouth 2 (two) times daily. ?Patient taking differently: Take 40 mg by mouth daily. 11/27/20  Yes Lorella Nimrod, MD  ?umeclidinium-vilanterol (ANORO ELLIPTA) 62.5-25 MCG/INH AEPB Inhale 1 puff into the lungs daily in the afternoon. 01/31/19  Yes [provider]  ?VENTOLIN HFA 108 (90 Base) MCG/ACT inhaler Inhale 2 puffs into the lungs every 6 (six) hours as needed (Cop[place]). 11/23/20  Yes [provider]  ? ?Review of Systems  ?Constitutional:  Positive for appetite change (decreased) and fatigue.  ?HENT:  Negative for congestion, postnasal drip and sore throat.   ?Eyes: Negative.   ?  Respiratory:  Positive for cough and shortness of breath. Negative for chest tightness and wheezing.   ?Cardiovascular:  Positive for palpitations (at times). Negative for chest pain and leg swelling.  ?Gastrointestinal:  Positive for abdominal pain (at times) and nausea (depending on what he eats). Negative for abdominal distention and diarrhea.  ?Endocrine: Negative.   ?Genitourinary: Negative.   ?Musculoskeletal:  Positive for back pain. Negative for arthralgias.  ?Skin: Negative.   ?Allergic/Immunologic: Negative.   ?Neurological:  Negative for dizziness, light-headedness and headaches.  ?Hematological:  Negative for adenopathy. Does not bruise/bleed easily.  ?Psychiatric/Behavioral:  Negative for dysphoric mood and sleep disturbance (sleeping on 4 pillows with CPAP/ oxygen). The patient is not nervous/anxious.   ? ?Vitals:  ? 12/16/21 0854  ?BP: 100/83  ?Pulse: 100  ?Resp: 20  ?SpO2: 98%  ?Weight: 276 lb (125.2 kg)  ?Height: '5\' 6"'$  (1.676 m)  ? ?Wt Readings from Last 3 Encounters:  ?12/16/21 276 lb (125.2 kg)  ?11/25/21 277 lb 9.6 oz (125.9 kg)  ?11/25/21 264 lb 15.9 oz (120.2 kg)  ? ?Lab Results  ?Component Value Date  ?  CREATININE 1.69 (H) 10/28/2021  ? CREATININE 1.14 10/05/2021  ? CREATININE 1.50 (H) 10/04/2021  ? ?Physical Exam ?Vitals and nursing note reviewed.  ?Constitutional:   ?   Appearance: He is well-developed.

## 2021-12-16 ENCOUNTER — Encounter: Payer: Self-pay | Admitting: Family

## 2021-12-16 ENCOUNTER — Ambulatory Visit: Payer: Medicare Other | Attending: Family | Admitting: Family

## 2021-12-16 ENCOUNTER — Ambulatory Visit: Payer: Medicare Other | Admitting: Dermatology

## 2021-12-16 VITALS — BP 100/83 | HR 100 | Resp 20 | Ht 66.0 in | Wt 276.0 lb

## 2021-12-16 DIAGNOSIS — Z720 Tobacco use: Secondary | ICD-10-CM

## 2021-12-16 DIAGNOSIS — E785 Hyperlipidemia, unspecified: Secondary | ICD-10-CM | POA: Diagnosis not present

## 2021-12-16 DIAGNOSIS — K429 Umbilical hernia without obstruction or gangrene: Secondary | ICD-10-CM | POA: Insufficient documentation

## 2021-12-16 DIAGNOSIS — Z95810 Presence of automatic (implantable) cardiac defibrillator: Secondary | ICD-10-CM | POA: Insufficient documentation

## 2021-12-16 DIAGNOSIS — I10 Essential (primary) hypertension: Secondary | ICD-10-CM

## 2021-12-16 DIAGNOSIS — I130 Hypertensive heart and chronic kidney disease with heart failure and stage 1 through stage 4 chronic kidney disease, or unspecified chronic kidney disease: Secondary | ICD-10-CM | POA: Diagnosis present

## 2021-12-16 DIAGNOSIS — I252 Old myocardial infarction: Secondary | ICD-10-CM | POA: Diagnosis not present

## 2021-12-16 DIAGNOSIS — I4891 Unspecified atrial fibrillation: Secondary | ICD-10-CM | POA: Insufficient documentation

## 2021-12-16 DIAGNOSIS — Z9981 Dependence on supplemental oxygen: Secondary | ICD-10-CM | POA: Diagnosis not present

## 2021-12-16 DIAGNOSIS — F17210 Nicotine dependence, cigarettes, uncomplicated: Secondary | ICD-10-CM | POA: Diagnosis not present

## 2021-12-16 DIAGNOSIS — G4733 Obstructive sleep apnea (adult) (pediatric): Secondary | ICD-10-CM

## 2021-12-16 DIAGNOSIS — J45909 Unspecified asthma, uncomplicated: Secondary | ICD-10-CM | POA: Insufficient documentation

## 2021-12-16 DIAGNOSIS — K859 Acute pancreatitis without necrosis or infection, unspecified: Secondary | ICD-10-CM | POA: Insufficient documentation

## 2021-12-16 DIAGNOSIS — K219 Gastro-esophageal reflux disease without esophagitis: Secondary | ICD-10-CM | POA: Diagnosis not present

## 2021-12-16 DIAGNOSIS — E1122 Type 2 diabetes mellitus with diabetic chronic kidney disease: Secondary | ICD-10-CM | POA: Diagnosis not present

## 2021-12-16 DIAGNOSIS — I5022 Chronic systolic (congestive) heart failure: Secondary | ICD-10-CM | POA: Insufficient documentation

## 2021-12-16 DIAGNOSIS — E119 Type 2 diabetes mellitus without complications: Secondary | ICD-10-CM

## 2021-12-16 DIAGNOSIS — K861 Other chronic pancreatitis: Secondary | ICD-10-CM

## 2021-12-16 DIAGNOSIS — N189 Chronic kidney disease, unspecified: Secondary | ICD-10-CM | POA: Diagnosis not present

## 2021-12-16 DIAGNOSIS — I480 Paroxysmal atrial fibrillation: Secondary | ICD-10-CM

## 2021-12-16 DIAGNOSIS — Z79899 Other long term (current) drug therapy: Secondary | ICD-10-CM | POA: Insufficient documentation

## 2021-12-16 DIAGNOSIS — Z794 Long term (current) use of insulin: Secondary | ICD-10-CM

## 2021-12-16 NOTE — Patient Instructions (Addendum)
Continue weighing daily and call for an overnight weight gain of 3 pounds or more or a weekly weight gain of more than 5 pounds. ? ? ?If you have voicemail, please make sure your mailbox is cleaned out so that we may leave a message and please make sure to listen to any voicemails.  ? ? ?Bring ALL medications including any over the counter or vitamins to every visit.  ? ? ?

## 2021-12-25 ENCOUNTER — Other Ambulatory Visit
Admission: RE | Admit: 2021-12-25 | Discharge: 2021-12-25 | Disposition: A | Discharge status: Home / Self Care | Payer: Medicare Other | Attending: Surgery | Admitting: Surgery

## 2021-12-25 ENCOUNTER — Encounter: Payer: Self-pay | Admitting: Surgery

## 2021-12-25 ENCOUNTER — Ambulatory Visit (INDEPENDENT_AMBULATORY_CARE_PROVIDER_SITE_OTHER): Payer: Medicare Other | Admitting: Surgery

## 2021-12-25 ENCOUNTER — Other Ambulatory Visit
Admission: RE | Admit: 2021-12-25 | Discharge: 2021-12-25 | Disposition: A | Discharge status: Home / Self Care | Payer: Medicare Other | Source: Home / Self Care | Attending: Gastroenterology | Admitting: Gastroenterology

## 2021-12-25 ENCOUNTER — Telehealth: Payer: Self-pay | Admitting: Surgery

## 2021-12-25 VITALS — BP 105/77 | HR 93 | Temp 98.3°F | Ht 66.0 in | Wt 270.2 lb

## 2021-12-25 DIAGNOSIS — K859 Acute pancreatitis without necrosis or infection, unspecified: Secondary | ICD-10-CM | POA: Diagnosis not present

## 2021-12-25 DIAGNOSIS — E1169 Type 2 diabetes mellitus with other specified complication: Secondary | ICD-10-CM | POA: Insufficient documentation

## 2021-12-25 DIAGNOSIS — I509 Heart failure, unspecified: Secondary | ICD-10-CM | POA: Insufficient documentation

## 2021-12-25 DIAGNOSIS — K420 Umbilical hernia with obstruction, without gangrene: Secondary | ICD-10-CM

## 2021-12-25 DIAGNOSIS — G8929 Other chronic pain: Secondary | ICD-10-CM | POA: Insufficient documentation

## 2021-12-25 DIAGNOSIS — K861 Other chronic pancreatitis: Secondary | ICD-10-CM | POA: Insufficient documentation

## 2021-12-25 DIAGNOSIS — R1013 Epigastric pain: Secondary | ICD-10-CM | POA: Insufficient documentation

## 2021-12-25 DIAGNOSIS — K429 Umbilical hernia without obstruction or gangrene: Secondary | ICD-10-CM

## 2021-12-25 LAB — CBC
HCT: 43.6 % (ref 39.0–52.0)
Hemoglobin: 15.2 g/dL (ref 13.0–17.0)
MCH: 33.2 pg (ref 26.0–34.0)
MCHC: 34.9 g/dL (ref 30.0–36.0)
MCV: 95.2 fL (ref 80.0–100.0)
Platelets: 174 10*3/uL (ref 150–400)
RBC: 4.58 MIL/uL (ref 4.22–5.81)
RDW: 13.1 % (ref 11.5–15.5)
WBC: 4.7 10*3/uL (ref 4.0–10.5)
nRBC: 0 % (ref 0.0–0.2)

## 2021-12-25 LAB — COMPREHENSIVE METABOLIC PANEL
ALT: 33 U/L (ref 0–44)
AST: 30 U/L (ref 15–41)
Albumin: 4 g/dL (ref 3.5–5.0)
Alkaline Phosphatase: 235 U/L — ABNORMAL HIGH (ref 38–126)
Anion gap: 9 (ref 5–15)
BUN: 36 mg/dL — ABNORMAL HIGH (ref 6–20)
CO2: 28 mmol/L (ref 22–32)
Calcium: 9 mg/dL (ref 8.9–10.3)
Chloride: 100 mmol/L (ref 98–111)
Creatinine, Ser: 1.75 mg/dL — ABNORMAL HIGH (ref 0.61–1.24)
GFR, Estimated: 48 mL/min — ABNORMAL LOW (ref >60)
Glucose, Bld: 118 mg/dL — ABNORMAL HIGH (ref 70–99)
Potassium: 3.8 mmol/L (ref 3.5–5.1)
Sodium: 137 mmol/L (ref 135–145)
Total Bilirubin: 0.9 mg/dL (ref 0.3–1.2)
Total Protein: 8.4 g/dL — ABNORMAL HIGH (ref 6.5–8.1)

## 2021-12-25 NOTE — H&P (View-Only) (Signed)
Outpatient Surgical Follow Up ? ?12/25/2021 ? ?Oscar [name] is an 46 y.o. male.  ? ?Chief Complaint  ?Patient presents with  ? Follow-up  ?  Umbilical hernia  ? ? ?HPI: He does have a  symptomatic ventral umbilical  hernia.  Reports intermittent abdominal pain and a bulge.  The pain is moderate intensity worsening with Valsalva. Pain is worsening over the last few weeks.  No fevers no chills.  He does have significant comorbidities to include a BMI of 43, COPD, coronary artery disease, CHF with an ejection fraction of 20%.,  He does have a history of pancreatitis and prior MIs. ?Just  underwent an endoscopic ultrasound that have personally reviewed showing evidence of normal pancreas.  No cbd stones or masses.  ?Currently he seems to be compensated and is able to walk.  He did have a CT scan that I personally reviewed showing evidence of ventral hernia. He had gallbladder sludge at some point in time. ?He has seen Dr. [name] with from cardiology, , EF is less than 20%. ?His blood glucose has been better controlled after he has been adhering to the appropriate medications.  He has been seen by PCP and medications are adjusted.  He is running now with a blood sugar less than 150. ?He has also improve his weight.  He is 270 pounds today but he was up to 302 pounds a few months ago.  He seems to be more active and there is no evidence of heart failure exacerbation. ? ? ? ?Past Medical History:  ?Diagnosis Date  ? AICD (automatic cardioverter/defibrillator) present   ? Asthma   ? Cardiomyopathy ([place])   ? CHF (congestive heart failure) ([place])   ? Coronary artery disease   ? Deafness in right ear   ? Diabetes mellitus without complication ([place])   ? Dilated cardiomyopathy ([place])   ? Dysrhythmia   ? svt  ? Failure in dosage   ? chronic respiratory   ? GERD (gastroesophageal reflux disease)   ? Hyperlipidemia   ? Hypertension   ? Hypoxemia   ? Hypoxemia   ? Mild obesity   ? Myocardial infarction [facility])   ? [id number],[id number]/37  ?  Pancreatitis   ? Sleep apnea   ? osa  ? ? ?Past Surgical History:  ?Procedure Laterality Date  ? BIOPSY  11/25/2021  ? Procedure: BIOPSY;  Surgeon: Irving Copas., MD;  Location: [facility];  Service: Gastroenterology;;  ? CARDIAC CATHETERIZATION  12/11/2014  ? Procedure: RIGHT/LEFT HEART CATH AND CORONARY ANGIOGRAPHY;  Surgeon: Lorretta Harp, MD;  Location: [facility] CATH LAB;  Service: Cardiovascular;;  ? COLONOSCOPY WITH PROPOFOL N/A 11/13/2021  ? Procedure: COLONOSCOPY WITH PROPOFOL;  Surgeon: Lin Landsman, MD;  Location: [facility] ENDOSCOPY;  Service: Gastroenterology;  Laterality: N/A;  ? ESOPHAGOGASTRODUODENOSCOPY (EGD) WITH PROPOFOL N/A 11/25/2021  ? Procedure: ESOPHAGOGASTRODUODENOSCOPY (EGD) WITH PROPOFOL;  Surgeon: Rush Landmark Telford Nab., MD;  Location: [facility]OS[facility];  Service: Gastroenterology;  Laterality: N/A;  ? ICD LEAD REMOVAL N/A 03/30/2015  ? Procedure: ICD LEAD REMOVAL;  Surgeon: Marzetta Board, MD;  Location: [facility] ORS;  Service: Cardiovascular;  Laterality: N/A;  ? IMPLANTABLE CARDIOVERTER DEFIBRILLATOR IMPLANT    ? INCISION AND DRAINAGE ABSCESS N/A 12/12/2020  ? Procedure: INCISION AND DRAINAGE ABSCESS;  Surgeon: Jules Husbands, MD;  Location: [facility] ORS;  Service: General;  Laterality: N/A;  ? INSERT / REPLACE / REMOVE PACEMAKER    ? LEFT HEART CATHETERIZATION WITH CORONARY ANGIOGRAM N/A 12/09/2014  ? Procedure: LEFT  HEART CATHETERIZATION WITH CORONARY ANGIOGRAM;  Surgeon: Burnell Blanks, MD;  Location: [facility] CATH LAB;  Service: Cardiovascular;  Laterality: N/A;  ? UPPER ESOPHAGEAL ENDOSCOPIC ULTRASOUND (EUS) N/A 11/25/2021  ? Procedure: UPPER ESOPHAGEAL ENDOSCOPIC ULTRASOUND (EUS);  Surgeon: Irving Copas., MD;  Location: [facility];  Service: Gastroenterology;  Laterality: N/A;  ? ? ?Family History  ?Problem Relation Age of Onset  ? Hypertension Mother   ? Congestive Heart Failure Mother   ? Hypertension Sister   ? Diabetes Sister   ? Pancreatitis Sister   ? COPD Sister   ?  Pancreatitis Brother   ? Anemia Neg Hx   ? Arrhythmia Neg Hx   ? Asthma Neg Hx   ? Clotting disorder Neg Hx   ? Fainting Neg Hx   ? Heart attack Neg Hx   ? Heart disease Neg Hx   ? Heart failure Neg Hx   ? Hyperlipidemia Neg Hx   ? ? ?Social History:  reports that he has been smoking cigarettes. He has a 12.00 pack-year smoking history. He has never used smokeless tobacco. He reports that he does not drink alcohol and does not use drugs. ? ?Allergies:  ?Allergies  ?Allergen Reactions  ? Ciprofloxacin Swelling and Other (See Comments)  ?  Migraine Headache ?  ? Iodinated Contrast Media   ?  Other reaction(s): Vomiting ?Projectile vomiting  ? Isosorb Dinitrate-Hydralazine Other (See Comments)  ?  Migraine Headache  ? ? ?Medications reviewed. ? ? ? ?ROS ?Full ROS performed and is otherwise negative other than what is stated in HPI ? ? ?BP 105/77   Pulse 93   Temp 98.3 ?F (36.8 ?C) (Oral)   Ht 5' 6"  (1.676 m)   Wt 270 lb 3.2 oz (122.6 kg)   SpO2 97%   BMI 43.61 kg/m?  ? ?Physical Exam ?Physical Exam ?Physical Exam ?CONSTITUTIONAL: NAD BMI 43. ?EYES: Pupils are equal, round,, Sclera are non-icteric. ?EARS, NOSE, MOUTH AND THROAT: Hearing is intact to voice. ?LYMPH NODES:  Lymph nodes in the neck are normal. ?RESPIRATORY:   There is normal respiratory effort, with equal breath sounds bilaterally, and without pathologic use of accessory muscles. ?CARDIOVASCULAR: Heart is regular without murmurs, gallops, or rubs. ?GI: The abdomen is  soft, chronically incarcerated ventral hernia measures approximately 4 cm there are no palpable masses. There is no hepatosplenomegaly. There are normal bowel sounds  ?GU: Rectal deferred.   ?MUSCULOSKELETAL: Normal muscle strength and tone. No cyanosis or edema.   ?SKIN: Turgor is good and there are no pathologic skin lesions or ulcers. ?NEUROLOGIC: Motor and sensation is grossly normal. Cranial nerves are grossly intact. ?PSYCH:  Oriented to person, place and time. Affect is normal. ?   ? 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  ? Discussed with the patient in detail.   ?He has made significant efforts to modify his risk factors to include his weight and his diabetes.  I will obtain a repeat hemoglobin A1c and CMP. There is a question about cirrhosis but there is no ascites and LFT were ok except alk phos.  I do think that he is not an ideal operative candidate, from a realistic and practical perspective this is I think as good as were going to get him within a reasonable time.  I had an extensive discussion with him regarding his disease process.  I do think that the benefits of performing the umbilical hernia  and cholecystectomy outweighed the risks. ? I was very candid with him about my thought process and he is in agreement.   ?Regarding the use of mesh for the umbilical hernia they will be a decision based on intraoperative findings. ?I discussed the procedure in detail.  The patient was given Neurosurgeon.  We discussed the risks and benefits of a laparoscopic cholecystectomy and possible cholangiogram including, but not limited to bleeding, infection, injury to surrounding structures such as the intestine or liver, bile leak, retained gallstones, need to convert to an open procedure, prolonged diarrhea, blood clots such as  DVT, common bile duct injury, anesthesia risks, and possible need for additional procedures.  The likelihood of improvement in symptoms and return to the patient's normal status is good. We discussed the typical post-operative recovery course.  ?I have also had a discussion regarding the repair of the umbilical hernia.  Specifically risk of infection, recurrence and chronic pain. ?He understands and wishes to proceed ?I spent 40 minutes in this encounter including personally reviewing imaging studies, coordination of his care, placing  orders and performing appropriate documentation ? ?Caroleen Hamman, MD FACS ?General Surgeon  ?

## 2021-12-25 NOTE — Patient Instructions (Addendum)
Have your lab work done at [facility] today.  ? ?You have requested to have your gallbladder removed. This will be done on 01/02/22 at [facility] with Dr. Dahlia Byes. ? ?You will most likely be out of work 1-2 weeks for this surgery.  ?If you have FMLA or disability paperwork that needs filled out you may drop this off at our office or this can be faxed to [phone]. ? ?You will return after your post-op appointment with a lifting restriction for approximately 4 more weeks. ? ?You will be able to eat anything you would like to following surgery. But, start by eating a bland diet and advance this as tolerated. The Gallbladder diet is below, please go as closely by this diet as possible prior to surgery to avoid any further attacks. ? ?Please see the (blue)pre-care form that you have been given today. Our surgery scheduler will call you to verify surgery date and to go over information.  ? ?If you have any questions, please call our office. ? ?Laparoscopic Cholecystectomy ?Laparoscopic cholecystectomy is surgery to remove the gallbladder. The gallbladder is located in the upper right part of the abdomen, behind the liver. It is a storage sac for bile, which is produced in the liver. Bile aids in the digestion and absorption of fats. Cholecystectomy is often done for inflammation of the gallbladder (cholecystitis). This condition is usually caused by a buildup of gallstones (cholelithiasis) in the gallbladder. Gallstones can block the flow of bile, and that can result in inflammation and pain. In severe cases, emergency surgery may be required. If emergency surgery is not required, you will have time to prepare for the procedure. ?Laparoscopic surgery is an alternative to open surgery. Laparoscopic surgery has a shorter recovery time. Your common bile duct may also need to be examined during the procedure. If stones are found in the common bile duct, they may be removed. ?LET [facility] CARE  PROVIDER KNOW ABOUT: ?Any allergies you have. ?All medicines you are taking, including vitamins, herbs, eye drops, creams, and over-the-counter medicines. ?Previous problems you or members of your family have had with the use of anesthetics. ?Any blood disorders you have. ?Previous surgeries you have had. ? ?Any medical conditions you have. ?RISKS AND COMPLICATIONS ?Generally, this is a safe procedure. However, problems may occur, including: ?Infection. ?Bleeding. ?Allergic reactions to medicines. ?Damage to other structures or organs. ?A stone remaining in the common bile duct. ?A bile leak from the cyst duct that is clipped when your gallbladder is removed. ?The need to convert to open surgery, which requires a larger incision in the abdomen. This may be necessary if your surgeon thinks that it is not safe to continue with a laparoscopic procedure. ?BEFORE THE PROCEDURE ?Ask your health care provider about: ?Changing or stopping your regular medicines. This is especially important if you are taking diabetes medicines or blood thinners. ?Taking medicines such as aspirin and ibuprofen. These medicines can thin your blood. Do not take these medicines before your procedure if your health care provider instructs you not to. ?Follow instructions from your health care provider about eating or drinking restrictions. ?Let your health care provider know if you develop a cold or an infection before surgery. ?Plan to have someone take you home after the procedure. ?Ask your health care provider how your surgical site will be marked or identified. ?You may be given antibiotic medicine to help prevent infection. ?PROCEDURE ?To reduce your risk of infection: ?Your health care team  will wash or sanitize their hands. ?Your skin will be washed with soap. ?An IV tube may be inserted into one of your veins. ?You will be given a medicine to make you fall asleep (general anesthetic). ?A breathing tube will be placed in your mouth. ?The  surgeon will make several small cuts (incisions) in your abdomen. ?A thin, lighted tube (laparoscope) that has a tiny camera on the end will be inserted through one of the small incisions. The camera on the laparoscope will send a picture to a TV screen (monitor) in the operating room. This will give the surgeon a good view inside your abdomen. ?A gas will be pumped into your abdomen. This will expand your abdomen to give the surgeon more room to perform the surgery. ?Other tools that are needed for the procedure will be inserted through the other incisions. The gallbladder will be removed through one of the incisions. ?After your gallbladder has been removed, the incisions will be closed with stitches (sutures), staples, or skin glue. ?Your incisions may be covered with a bandage (dressing). ?The procedure may vary among health care providers and hospitals. ?AFTER THE PROCEDURE ?Your blood pressure, heart rate, breathing rate, and blood oxygen level will be monitored often until the medicines you were given have worn off. ?You will be given medicines as needed to control your pain. ?  ?This information is not intended to replace advice given to you by your health care provider. Make sure you discuss any questions you have with your health care provider. ?  ?Document Released: 08/18/2005 Document Revised: 05/09/2015 Document Reviewed: 03/30/2013 ?Elsevier Interactive Patient Education ?2016 [employer]. ? ? ?Low-Fat Diet for Gallbladder Conditions ?A low-fat diet can be helpful if you have pancreatitis or a gallbladder condition. With these conditions, your pancreas and gallbladder have trouble digesting fats. A healthy eating plan with less fat will help rest your pancreas and gallbladder and reduce your symptoms. ?WHAT DO I NEED TO KNOW ABOUT THIS DIET? ?Eat a low-fat diet. ?Reduce your fat intake to less than 20-30% of your total daily calories. This is less than 50-60 g of fat per day. ?Remember that you need  some fat in your diet. Ask your dietician what your daily goal should be. ?Choose nonfat and low-fat healthy foods. Look for the words "nonfat," "low fat," or "fat free." ?As a guide, look on the label and choose foods with less than 3 g of fat per serving. Eat only one serving. ?Avoid alcohol. ?Do not smoke. If you need help quitting, talk with your health care provider. ?Eat small frequent meals instead of three large heavy meals. ?WHAT FOODS CAN I EAT? ?Grains ?Include healthy grains and starches such as potatoes, wheat bread, fiber-rich cereal, and brown rice. Choose whole grain options whenever possible. In adults, whole grains should account for 45-65% of your daily calories.  ?Fruits and Vegetables ?Eat plenty of fruits and vegetables. Fresh fruits and vegetables add fiber to your diet. ?Meats and Other Protein Sources ?Eat lean meat such as chicken and pork. Trim any fat off of meat before cooking it. Eggs, fish, and beans are other sources of protein. In adults, these foods should account for 10-35% of your daily calories. ?Dairy ?Choose low-fat milk and dairy options. Dairy includes fat and protein, as well as calcium.  ?Fats and Oils ?Limit high-fat foods such as fried foods, sweets, baked goods, sugary drinks.  ?Other ?Creamy sauces and condiments, such as mayonnaise, can add extra fat. Think about whether  or not you need to use them, or use smaller amounts or low fat options. ?WHAT FOODS ARE NOT RECOMMENDED? ?High fat foods, such as: ?[employer]. ?Ice cream. ?[place] toast. ?Sweet rolls. ?Pizza. ?Cheese bread. ?Foods covered with batter, butter, creamy sauces, or cheese. ?Fried foods. ?Sugary drinks and desserts. ?Foods that cause gas or bloating ?  ?This information is not intended to replace advice given to you by your health care provider. Make sure you discuss any questions you have with your health care provider. ?  ?Document Released: 08/23/2013 Document Reviewed: 08/23/2013 ?Elsevier Interactive  Patient Education ?2016 [place]. ? ? ?

## 2021-12-25 NOTE — Progress Notes (Signed)
Outpatient Surgical Follow Up ? ?12/25/2021 ? ?Oscar [name] is an 46 y.o. male.  ? ?Chief Complaint  ?Patient presents with  ? Follow-up  ?  Umbilical hernia  ? ? ?HPI: He does have a  symptomatic ventral umbilical  hernia.  Reports intermittent abdominal pain and a bulge.  The pain is moderate intensity worsening with Valsalva. Pain is worsening over the last few weeks.  No fevers no chills.  He does have significant comorbidities to include a BMI of 43, COPD, coronary artery disease, CHF with an ejection fraction of 20%.,  He does have a history of pancreatitis and prior MIs. ?Just  underwent an endoscopic ultrasound that have personally reviewed showing evidence of normal pancreas.  No cbd stones or masses.  ?Currently he seems to be compensated and is able to walk.  He did have a CT scan that I personally reviewed showing evidence of ventral hernia. He had gallbladder sludge at some point in time. ?He has seen Dr. [name] with from cardiology, , EF is less than 20%. ?His blood glucose has been better controlled after he has been adhering to the appropriate medications.  He has been seen by PCP and medications are adjusted.  He is running now with a blood sugar less than 150. ?He has also improve his weight.  He is 270 pounds today but he was up to 302 pounds a few months ago.  He seems to be more active and there is no evidence of heart failure exacerbation. ? ? ? ?Past Medical History:  ?Diagnosis Date  ? AICD (automatic cardioverter/defibrillator) present   ? Asthma   ? Cardiomyopathy ([place])   ? CHF (congestive heart failure) ([place])   ? Coronary artery disease   ? Deafness in right ear   ? Diabetes mellitus without complication ([place])   ? Dilated cardiomyopathy ([place])   ? Dysrhythmia   ? svt  ? Failure in dosage   ? chronic respiratory   ? GERD (gastroesophageal reflux disease)   ? Hyperlipidemia   ? Hypertension   ? Hypoxemia   ? Hypoxemia   ? Mild obesity   ? Myocardial infarction [facility])   ? [id number],3536,1/44  ?  Pancreatitis   ? Sleep apnea   ? osa  ? ? ?Past Surgical History:  ?Procedure Laterality Date  ? BIOPSY  11/25/2021  ? Procedure: BIOPSY;  Surgeon: Irving Copas., MD;  Location: [facility] ENDOSCOPY;  Service: Gastroenterology;;  ? CARDIAC CATHETERIZATION  12/11/2014  ? Procedure: RIGHT/LEFT HEART CATH AND CORONARY ANGIOGRAPHY;  Surgeon: Lorretta Harp, MD;  Location: [facility] CATH LAB;  Service: Cardiovascular;;  ? COLONOSCOPY WITH PROPOFOL N/A 11/13/2021  ? Procedure: COLONOSCOPY WITH PROPOFOL;  Surgeon: Lin Landsman, MD;  Location: [facility] ENDOSCOPY;  Service: Gastroenterology;  Laterality: N/A;  ? ESOPHAGOGASTRODUODENOSCOPY (EGD) WITH PROPOFOL N/A 11/25/2021  ? Procedure: ESOPHAGOGASTRODUODENOSCOPY (EGD) WITH PROPOFOL;  Surgeon: Rush Landmark Telford Nab., MD;  Location: [facility] ENDOSCOPY;  Service: Gastroenterology;  Laterality: N/A;  ? ICD LEAD REMOVAL N/A 03/30/2015  ? Procedure: ICD LEAD REMOVAL;  Surgeon: Marzetta Board, MD;  Location: [facility] ORS;  Service: Cardiovascular;  Laterality: N/A;  ? IMPLANTABLE CARDIOVERTER DEFIBRILLATOR IMPLANT    ? INCISION AND DRAINAGE ABSCESS N/A 12/12/2020  ? Procedure: INCISION AND DRAINAGE ABSCESS;  Surgeon: Jules Husbands, MD;  Location: [facility] ORS;  Service: General;  Laterality: N/A;  ? INSERT / REPLACE / REMOVE PACEMAKER    ? LEFT HEART CATHETERIZATION WITH CORONARY ANGIOGRAM N/A 12/09/2014  ? Procedure: LEFT  HEART CATHETERIZATION WITH CORONARY ANGIOGRAM;  Surgeon: Burnell Blanks, MD;  Location: [facility] CATH LAB;  Service: Cardiovascular;  Laterality: N/A;  ? UPPER ESOPHAGEAL ENDOSCOPIC ULTRASOUND (EUS) N/A 11/25/2021  ? Procedure: UPPER ESOPHAGEAL ENDOSCOPIC ULTRASOUND (EUS);  Surgeon: Irving Copas., MD;  Location: [facility] ENDOSCOPY;  Service: Gastroenterology;  Laterality: N/A;  ? ? ?Family History  ?Problem Relation Age of Onset  ? Hypertension Mother   ? Congestive Heart Failure Mother   ? Hypertension Sister   ? Diabetes Sister   ? Pancreatitis Sister   ? COPD Sister   ?  Pancreatitis Brother   ? Anemia Neg Hx   ? Arrhythmia Neg Hx   ? Asthma Neg Hx   ? Clotting disorder Neg Hx   ? Fainting Neg Hx   ? Heart attack Neg Hx   ? Heart disease Neg Hx   ? Heart failure Neg Hx   ? Hyperlipidemia Neg Hx   ? ? ?Social History:  reports that he has been smoking cigarettes. He has a 12.00 pack-year smoking history. He has never used smokeless tobacco. He reports that he does not drink alcohol and does not use drugs. ? ?Allergies:  ?Allergies  ?Allergen Reactions  ? Ciprofloxacin Swelling and Other (See Comments)  ?  Migraine Headache ?  ? Iodinated Contrast Media   ?  Other reaction(s): Vomiting ?Projectile vomiting  ? Isosorb Dinitrate-Hydralazine Other (See Comments)  ?  Migraine Headache  ? ? ?Medications reviewed. ? ? ? ?ROS ?Full ROS performed and is otherwise negative other than what is stated in HPI ? ? ?BP 105/77   Pulse 93   Temp 98.3 ?F (36.8 ?C) (Oral)   Ht 5' 6"  (1.676 m)   Wt 270 lb 3.2 oz (122.6 kg)   SpO2 97%   BMI 43.61 kg/m?  ? ?Physical Exam ?Physical Exam ?Physical Exam ?CONSTITUTIONAL: NAD BMI 43. ?EYES: Pupils are equal, round,, Sclera are non-icteric. ?EARS, NOSE, MOUTH AND THROAT: Hearing is intact to voice. ?LYMPH NODES:  Lymph nodes in the neck are normal. ?RESPIRATORY:   There is normal respiratory effort, with equal breath sounds bilaterally, and without pathologic use of accessory muscles. ?CARDIOVASCULAR: Heart is regular without murmurs, gallops, or rubs. ?GI: The abdomen is  soft, chronically incarcerated ventral hernia measures approximately 4 cm there are no palpable masses. There is no hepatosplenomegaly. There are normal bowel sounds  ?GU: Rectal deferred.   ?MUSCULOSKELETAL: Normal muscle strength and tone. No cyanosis or edema.   ?SKIN: Turgor is good and there are no pathologic skin lesions or ulcers. ?NEUROLOGIC: Motor and sensation is grossly normal. Cranial nerves are grossly intact. ?PSYCH:  Oriented to person, place and time. Affect is normal. ?   ? 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  ? Discussed with the patient in detail.   ?He has made significant efforts to modify his risk factors to include his weight and his diabetes.  I will obtain a repeat hemoglobin A1c and CMP. There is a question about cirrhosis but there is no ascites and LFT were ok except alk phos.  I do think that he is not an ideal operative candidate, from a realistic and practical perspective this is I think as good as were going to get him within a reasonable time.  I had an extensive discussion with him regarding his disease process.  I do think that the benefits of performing the umbilical hernia  and cholecystectomy outweighed the risks. ? I was very candid with him about my thought process and he is in agreement.   ?Regarding the use of mesh for the umbilical hernia they will be a decision based on intraoperative findings. ?I discussed the procedure in detail.  The patient was given Neurosurgeon.  We discussed the risks and benefits of a laparoscopic cholecystectomy and possible cholangiogram including, but not limited to bleeding, infection, injury to surrounding structures such as the intestine or liver, bile leak, retained gallstones, need to convert to an open procedure, prolonged diarrhea, blood clots such as  DVT, common bile duct injury, anesthesia risks, and possible need for additional procedures.  The likelihood of improvement in symptoms and return to the patient's normal status is good. We discussed the typical post-operative recovery course.  ?I have also had a discussion regarding the repair of the umbilical hernia.  Specifically risk of infection, recurrence and chronic pain. ?He understands and wishes to proceed ?I spent 40 minutes in this encounter including personally reviewing imaging studies, coordination of his care, placing  orders and performing appropriate documentation ? ?Caroleen Hamman, MD FACS ?General Surgeon  ?

## 2021-12-25 NOTE — Progress Notes (Signed)
Request for Cardiology clearance has been faxed to Gladstone Pih, NP at [facility] Cardiology.  ?

## 2021-12-25 NOTE — Telephone Encounter (Signed)
Patient has been advised of Pre-Admission date/time, COVID Testing date and Surgery date. ? ?Surgery Date: 01/02/22 ?Preadmission Testing Date: 12/30/21 (phone 1p-5p) ?Covid Testing Date: Not needed.    ? ?Patient has been made aware to call [phone], between 1-3:00pm the day before surgery, to find out what time to arrive for surgery.   ? ?

## 2021-12-26 LAB — HEMOGLOBIN A1C
Hgb A1c MFr Bld: 6.8 % — ABNORMAL HIGH (ref 4.8–5.6)
Mean Plasma Glucose: 148.46 mg/dL

## 2021-12-26 LAB — ANA: Anti Nuclear Antibody (ANA): NEGATIVE

## 2021-12-30 ENCOUNTER — Other Ambulatory Visit: Payer: Self-pay

## 2021-12-30 ENCOUNTER — Other Ambulatory Visit
Admission: RE | Admit: 2021-12-30 | Discharge: 2021-12-30 | Disposition: A | Discharge status: Home / Self Care | Payer: Medicare Other | Source: Ambulatory Visit | Attending: Surgery | Admitting: Surgery

## 2021-12-30 HISTORY — DX: Chronic obstructive pulmonary disease, unspecified: J44.9

## 2021-12-30 HISTORY — DX: Chronic kidney disease, unspecified: N18.9

## 2021-12-30 HISTORY — DX: Dyspnea, unspecified: R06.00

## 2021-12-30 NOTE — Patient Instructions (Addendum)
Your procedure is scheduled on: 01/02/22 - Thursday ?Report to the Registration Desk on the 1st floor of the [facility]. ?To find out your arrival time, please call [phone] between 1PM - 3PM on: 01/01/22 - Wednesday ?If your arrival time is 6:00 am, do not arrive prior to that time as the [facility] entrance doors do not open until 6:00 am. ? ?REMEMBER: ?Instructions that are not followed completely may result in serious medical risk, up to and including death; or upon the discretion of your surgeon and anesthesiologist your surgery may need to be rescheduled. ? ?Do not eat food after midnight the night before surgery.  ?No gum chewing, lozengers or hard candies. ? ?You may however, drink CLEAR liquids up to 2 hours before you are scheduled to arrive for your surgery. Do not drink anything within 2 hours of your scheduled arrival time. ? ?Clear liquids include: ?- water  ? ?TAKE ONLY THESE MEDICATIONS THE MORNING OF SURGERY WITH A SIP OF WATER: ? ?- ANORO ELLIPTA ?- pantoprazole (PROTONIX) 40 MG tablet, (take one the night before and one on the morning of surgery - helps to prevent nausea after surgery.) ? ?Use VENTOLIN HFA 108  on the day of surgery and bring to the hospital. ? ?Stop taking canagliflozin (INVOKANA) beginning 12/30/21, may resume the day after surgery. ? ?One week prior to surgery: ?Stop Anti-inflammatories (NSAIDS) such as Advil, Aleve, Ibuprofen, Motrin, Naproxen, Naprosyn and Aspirin based products such as Excedrin, Goodys Powder, BC Powder. ? ?Stop ANY OVER THE COUNTER supplements until after surgery. ? ?You may however, continue to take Tylenol if needed for pain up until the day of surgery. ? ?No Alcohol for 24 hours before or after surgery. ? ?No Smoking including e-cigarettes for 24 hours prior to surgery.  ?No chewable tobacco products for at least 6 hours prior to surgery.  ?No nicotine patches on the day of surgery. ? ?Do not use any "recreational" drugs for at least a week  prior to your surgery.  ?Please be advised that the combination of cocaine and anesthesia may have negative outcomes, up to and including death. ?If you test positive for cocaine, your surgery will be cancelled. ? ?On the morning of surgery brush your teeth with toothpaste and water, you may rinse your mouth with mouthwash if you wish. ?Do not swallow any toothpaste or mouthwash. ? ?Do not wear jewelry, make-up, hairpins, clips or nail polish. ? ?Do not wear lotions, powders, or perfumes.  ? ?Do not shave body from the neck down 48 hours prior to surgery just in case you cut yourself which could leave a site for infection.  ?Also, freshly shaved skin may become irritated if using the CHG soap. ? ?Contact lenses, hearing aids and dentures may not be worn into surgery. ? ?Do not bring valuables to the hospital. [facility] is not responsible for any missing/lost belongings or valuables.  ? ?Bring your C-PAP to the hospital with you in case you may have to spend the night.  ? ?Notify your doctor if there is any change in your medical condition (cold, fever, infection). ? ?Wear comfortable clothing (specific to your surgery type) to the hospital. ? ?After surgery, you can help prevent lung complications by doing breathing exercises.  ?Take deep breaths and cough every 1-2 hours. Your doctor may order a device called an Incentive Spirometer to help you take deep breaths. ?When coughing or sneezing, hold a pillow firmly against your incision with both hands. This  is called ?splinting.? Doing this helps protect your incision. It also decreases belly discomfort. ? ?If you are being admitted to the hospital overnight, leave your suitcase in the car. ?After surgery it may be brought to your room. ? ?If you are being discharged the day of surgery, you will not be allowed to drive home. ?You will need a responsible adult (18 years or older) to drive you home and stay with you that night.  ? ?If you are taking public  transportation, you will need to have a responsible adult (18 years or older) with you. ?Please confirm with your physician that it is acceptable to use public transportation.  ? ?Please call the [facility] Dept. at [phone] if you have any questions about these instructions. ? ?Surgery Visitation Policy: ? ?Patients undergoing a surgery or procedure may have two family members or support persons with them as long as the person is not COVID-19 positive or experiencing its symptoms.  ? ?Inpatient Visitation:   ? ?Visiting hours are 7 a.m. to 8 p.m. ?Up to four visitors are allowed at one time in a patient room, including children. The visitors may rotate out with other people during the day. One designated support person (adult) may remain overnight.      ?

## 2021-12-31 ENCOUNTER — Encounter: Payer: Self-pay | Admitting: Surgery

## 2021-12-31 NOTE — Progress Notes (Signed)
?Perioperative Services ? ?Pre-Admission/Anesthesia Testing Clinical Review ? ?Date: 12/31/21 ? ?Patient Demographics:  ?Name: Oscar [name] ?DOB:   1976/08/25 ?MRN:   [id number] ? ?Planned Surgical Procedure(s):  ? ? Case: [id number] Date/Time: 01/02/22 1256  ? Procedures:  ?    XI ROBOTIC ASSISTED LAPAROSCOPIC CHOLECYSTECTOMY ?    INDOCYANINE GREEN FLUORESCENCE IMAGING (ICG) ?    XI ROBOT ASSISTED UMBILICAL HERNIA REPAIR, incarcerated  ? Anesthesia type: General  ? Pre-op diagnosis: biliary pancreatitis, umbilical hernia incarcerated 5 cm  ? Location: [facility] OR ROOM 06 / [facility]  ? Surgeons: Jules Husbands, MD  ? ?NOTE: Available PAT nursing documentation and vital signs have been reviewed. Clinical nursing staff has updated patient's [name]/PSHx, current medication list, and drug allergies/intolerances to ensure comprehensive history available to assist in medical decision making as it pertains to the aforementioned surgical procedure and anticipated anesthetic course. Extensive review of available clinical information performed. Oscar [name] and PSHx updated with any diagnoses/procedures that  may have been inadvertently omitted during his intake with the pre-admission testing department's nursing staff. ? ?Clinical Discussion:  ?Oscar [name] is a 46 y.o. male who is submitted for pre-surgical anesthesia review and clearance prior to him undergoing the above procedure. Patient is a Current Smoker (12 pack years). Pertinent [name] includes: HTN, NSTEMI x 3, NICM (s/p AICD placement), paroxysmal atrial fibrillation, paroxysmal supraventricular tachycardia, LBBB, aortic atherosclerosis, HTN, HLD, T2DM, chronic respiratory failure (on 2-3 L/[place] supplemental oxygen PRN), COPD, asthma, OSAH (requires nocturnal PAP therapy), CKD-III, GERD (on daily PPI), obesity. ? ?Patient is followed by cardiology Oscar Bigness, MD). He was last seen in the cardiology clinic on 10/29/2021; notes reviewed.  At the time  of his clinic visit, patient reported to be doing well overall from a cardiovascular perspective.  He reported that he been feeling well since his last visit.  Patient denied any episodes of chest pain, however he had chronic exertional dyspnea related to his underlying cardiopulmonary diagnoses.  Patient denied any PND, orthopnea, palpitations, significant peripheral edema, vertiginous symptoms, significant fatigue, or presyncope/syncope.  Patient with a past medical history significant for cardiovascular diagnoses. ? ?Diagnostic right and left heart catheterization performed on 12/14/2009 revealing normal coronary arteries.  LVEDP 18 mmHg; mean PA pressure 29 mmHg, mean PCWP 31 mmHg.  Cardiac output 6.0 L/min.  Right index 2.54 L/min/m?. ? ?Patient reported to have suffered 3 separate non ST elevation MIs in the past; 2012, 2014, 2016. ? ?Patient with a history of nonischemic cardiomyopathy.  Cardiac function monitored via transthoracic echocardiogram.  He has undergone AICD placement.  Last TTE was performed on 05/14/2021 revealing a severely reduced left ventricular systolic function with moderate LVH; LVEF <15%.  Left atrium severely dilated.  Right ventricle mildly enlarged.  There was trivial AR/PR, mild TR, and moderate MR. Diastolic Doppler parameters consistent with abnormal relaxation (G1DD). ? ?Repeat right/left heart catheterization performed on 12/11/2014 revealing normal coronary anatomy.  Mean RA pressure 9 mmHg, mean PA pressure 22 mmHg, and mean PCWP 22 mmHg.  Continued medical therapy was recommended. ? ?History also significant for a paroxysmal atrial fibrillation diagnosis;  CHA2DS2-VASc Score = 4 (HTN, HFrEF, previous MI, T2DM). Rate and rhythm maintained without the need of pharmacological intervention. Patient not currently taking any type of anticoagulation therapy. It appears as if patient was previously on rivaroxaban, however reported that this has been discontinued at this point..  Blood  pressure and HFrEF symptoms well managed on currently prescribed ANRi/ARB and diuretic  therapies; blood pressure 124/72. Patient is concurrently managed in the advanced heart failure clinic at [facility] by [place], NP-C. Patient is on supplemental oxygen at 2-3 L/[place] as needed. Patient is on a statin for his HLD and further ASCVD prevention. T2DM well-controlled on currently prescribed regimen; last HgbA1c was 6.8% when checked on 12/25/2021. Of note, patient is on an SGLT2i in the setting of T2DM with known HFrEF. Patient has an OSAH diagnosis and is reported to be compliant with prescribed nocturnal PAP therapy. Functional capacity somewhat limited by patient's underlying cardiopulmonary diagnoses, however he is still felt to be able to achieve at least 4 METS of activity without angina/anginal equivalent symptoms.  No changes were made to his medication regimen.  Patient follow-up with outpatient cardiology in 3 months or sooner if needed. ? ?Oscar [name] is scheduled for an ROBOTIC ASSISTED LAPAROSCOPIC CHOLECYSTECTOMY; ROBOT ASSISTED INCARCERATED UMBILICAL HERNIA on 70/62/3762 with Dr. Caroleen Hamman, MD.  Given patient's past medical history significant for cardiovascular diagnoses, presurgical cardiac clearance was sought by the performing surgeon's office and PAT team. Per cardiology, "this patient is optimized for surgery and may proceed with the planned procedural course with a MODERATE risk of significant perioperative cardiovascular complications".  In review of his medication reconciliation, patient is not currently taking any type of anticoagulation or antiplatelet therapies that need to be held during his perioperative course. ? ?Patient denies previous perioperative complications with anesthesia in the past. In review of the available records, it is noted that patient underwent a MAC anesthetic course at [facility]  ([facility] IV) in 10/2021 without documented complications.  ? ? ?  12/25/2021  ?  8:49  AM 12/16/2021  ?  8:54 AM 11/25/2021  ?  2:09 PM  ?Vitals with BMI  ?Height _0  _1  _2   ?Weight 270 lbs 3 oz 276 lbs 277 lbs 10 oz  ?BMI 43.63 44.57 44.83  ?Systolic 831 517 616  ?Diastolic 77 83 70  ?Pulse 93 100 106  ? ? ?Providers/Specialists:  ? ?NOTE: Primary physician provider listed below. Patient may have been seen by APP or partner within same practice.  ? ?PROVIDER ROLE / SPECIALTY LAST OV  ?Jules Husbands, MD General Surgery (Surgeon) 12/25/2021  ?Revelo, Elyse Jarvis, MD Primary Care Provider ???  ?Katrine Coho, MD Cardiology 10/29/2021  ?Darylene Price, NP-C Advanced Heart Failure 12/16/2021  ?Lyla Son, MD Nephrology 11/15/2021  ? ?Allergies:  ?Ciprofloxacin, Iodinated contrast media, and Isosorb dinitrate-hydralazine ? ?Current Home Medications:  ? ?No current facility-administered medications for this encounter.  ? ? acetaminophen (TYLENOL) 500 MG tablet  ? allopurinol (ZYLOPRIM) 300 MG tablet  ? atorvastatin (LIPITOR) 40 MG tablet  ? canagliflozin (INVOKANA) 100 MG TABS tablet  ? colchicine 0.6 MG tablet  ? NON FORMULARY  ? pantoprazole (PROTONIX) 40 MG tablet  ? sacubitril-valsartan (ENTRESTO) 49-51 MG  ? torsemide (DEMADEX) 20 MG tablet  ? umeclidinium-vilanterol (ANORO ELLIPTA) 62.5-25 MCG/INH AEPB  ? clindamycin (CLEOCIN-T) 1 % lotion  ? ketoconazole (NIZORAL) 2 % cream  ? ondansetron (ZOFRAN) 4 MG tablet  ? tazarotene (AVAGE) 0.1 % cream  ? torsemide 40 MG TABS  ? VENTOLIN HFA 108 (90 Base) MCG/ACT inhaler  ? ?History:  ? ?Past Medical History:  ?Diagnosis Date  ? AICD (automatic cardioverter/defibrillator) present   ? Aortic atherosclerosis ([place])   ? Asthma   ? Chronic respiratory failure ([place])   ? CKD (chronic kidney disease), stage III ([place])   ? COPD (chronic obstructive pulmonary  disease) ([place])   ? Deafness in right ear   ? Diabetes mellitus without complication ([place])   ? Dyspnea   ? GERD (gastroesophageal reflux disease)   ? Gout   ? HFrEF (heart failure with reduced  ejection fraction) ([place])   ? a.) TTE 12/10/14: EF 25%; diff inf HK; sev LV and mod LA dil; LVH. b.) TTE 06/23/18: EF 20-25%; LVH; mild LV dil, mild BAE. c.) TTE 03/01/19: EF 15%, LVH, BAE; triv PR/TR. d.)

## 2021-12-31 NOTE — Progress Notes (Unsigned)
Cardiac Clearance received from Gladstone Pih, NP. The patient is cleared at Medium risk for surgery. He may hold his Xarelto for 4 days prior to surgery.  ?

## 2022-01-01 NOTE — Anesthesia Preprocedure Evaluation (Addendum)
Anesthesia Evaluation  ?Patient identified by MRN, date of birth, ID band ?Patient awake ? ? ? ?Reviewed: ?Allergy & Precautions, NPO status , Patient's Chart, lab work & pertinent test results ? ?History of Anesthesia Complications ?Negative for: history of anesthetic complications ? ?Airway ?Mallampati: II ? ?TM Distance: >3 FB ?Neck ROM: Full ? ? ? Dental ? ?(+) Dental Advisory Given, Missing ?  ?Pulmonary ?sleep apnea, Continuous Positive Airway Pressure Ventilation and Oxygen sleep apnea , COPD (oxygen prn daily),  COPD inhaler, Current Smoker and Patient abstained from smoking.,  ?Chronic respiratory failure ?  ?breath sounds clear to auscultation ? ? ? ? ? ? Cardiovascular ?Exercise Tolerance: Good ?hypertension, (-) angina+ Past MI (x 3 per patient report ---> 2012, 2014, 2016), +CHF (Entresto) and + DOE (chronic dyspnea on exertion that occurs intermittently and is without worsening)  ?(-) CAD + dysrhythmias (LBBB) Atrial Fibrillation and Supra Ventricular Tachycardia + pacemaker + Cardiac Defibrillator ?+ Valvular Problems/Murmurs (mild TR, Mod MR) MR  ?Rhythm:Regular Rate:Normal ? ?TTE 05/14/21: EF <15%; LVH; sev LA and mild RV enlar; triv AR/PR, mild TR, mod MR.S.    ?  ??'16 LHC: normal coronary anatomy  ? ?AICD- Medtronic dual ?  ?Neuro/Psych ?Deafness in right ear ?negative psych ROS  ? GI/Hepatic ?Neg liver ROS, GERD  Medicated and Controlled,  ?Endo/Other  ?diabetes, Type 2Morbid obesity ? Renal/GU ?Renal InsufficiencyRenal disease  ? ?  ?Musculoskeletal ? ? Abdominal ?(+) + obese,   ?Peds ? Hematology ?negative hematology ROS ?(+)   ?Anesthesia Other Findings ? ? Reproductive/Obstetrics ? ?  ? ? ? ? ? ? ? ? ? ? ? ? ? ?  ?  ? ? ? ? ? ? ? ?Anesthesia Physical ? ?Anesthesia Plan ? ?ASA: 4 ? ?Anesthesia Plan: General  ? ?Post-op Pain Management: Gabapentin PO (pre-op)*, Tylenol PO (pre-op)* and Regional block*  ? ?Induction: Intravenous ? ?PONV Risk Score and Plan:  0 ? ?Airway Management Planned: Oral ETT ? ?Additional Equipment: Arterial line ? ?Intra-op Plan:  ? ?Post-operative Plan: Extubation in OR ? ?Informed Consent: I have reviewed the patients History and Physical, chart, labs and discussed the procedure including the risks, benefits and alternatives for the proposed anesthesia with the patient or authorized representative who has indicated his/her understanding and acceptance.  ? ? ? ?Dental advisory given ? ?Plan Discussed with: CRNA, Surgeon and Anesthesiologist ? ?Anesthesia Plan Comments:   ? ? ? ? ?Anesthesia Quick Evaluation ? ?

## 2022-01-02 ENCOUNTER — Encounter
Admission: RE | Disposition: A | Discharge status: Home / Self Care | Payer: Self-pay | Source: Home / Self Care | Attending: Surgery

## 2022-01-02 ENCOUNTER — Ambulatory Visit: Payer: Medicare Other | Admitting: Urgent Care

## 2022-01-02 ENCOUNTER — Other Ambulatory Visit: Payer: Self-pay

## 2022-01-02 ENCOUNTER — Encounter: Payer: Self-pay | Admitting: Surgery

## 2022-01-02 ENCOUNTER — Ambulatory Visit
Admission: RE | Admit: 2022-01-02 | Discharge: 2022-01-02 | Disposition: A | Discharge status: Home / Self Care | Payer: Medicare Other | Attending: Surgery | Admitting: Surgery

## 2022-01-02 DIAGNOSIS — K429 Umbilical hernia without obstruction or gangrene: Secondary | ICD-10-CM

## 2022-01-02 DIAGNOSIS — K219 Gastro-esophageal reflux disease without esophagitis: Secondary | ICD-10-CM | POA: Diagnosis not present

## 2022-01-02 DIAGNOSIS — I4891 Unspecified atrial fibrillation: Secondary | ICD-10-CM | POA: Insufficient documentation

## 2022-01-02 DIAGNOSIS — K8010 Calculus of gallbladder with chronic cholecystitis without obstruction: Secondary | ICD-10-CM | POA: Diagnosis not present

## 2022-01-02 DIAGNOSIS — I110 Hypertensive heart disease with heart failure: Secondary | ICD-10-CM | POA: Diagnosis not present

## 2022-01-02 DIAGNOSIS — I509 Heart failure, unspecified: Secondary | ICD-10-CM | POA: Insufficient documentation

## 2022-01-02 DIAGNOSIS — I2510 Atherosclerotic heart disease of native coronary artery without angina pectoris: Secondary | ICD-10-CM | POA: Diagnosis not present

## 2022-01-02 DIAGNOSIS — Z950 Presence of cardiac pacemaker: Secondary | ICD-10-CM | POA: Insufficient documentation

## 2022-01-02 DIAGNOSIS — K811 Chronic cholecystitis: Secondary | ICD-10-CM | POA: Diagnosis not present

## 2022-01-02 DIAGNOSIS — I252 Old myocardial infarction: Secondary | ICD-10-CM | POA: Diagnosis not present

## 2022-01-02 DIAGNOSIS — Z6841 Body Mass Index (BMI) 40.0 and over, adult: Secondary | ICD-10-CM | POA: Insufficient documentation

## 2022-01-02 DIAGNOSIS — Z8719 Personal history of other diseases of the digestive system: Secondary | ICD-10-CM | POA: Diagnosis not present

## 2022-01-02 DIAGNOSIS — F17210 Nicotine dependence, cigarettes, uncomplicated: Secondary | ICD-10-CM | POA: Insufficient documentation

## 2022-01-02 DIAGNOSIS — G4730 Sleep apnea, unspecified: Secondary | ICD-10-CM | POA: Insufficient documentation

## 2022-01-02 DIAGNOSIS — K420 Umbilical hernia with obstruction, without gangrene: Secondary | ICD-10-CM | POA: Insufficient documentation

## 2022-01-02 DIAGNOSIS — J449 Chronic obstructive pulmonary disease, unspecified: Secondary | ICD-10-CM | POA: Diagnosis not present

## 2022-01-02 DIAGNOSIS — E119 Type 2 diabetes mellitus without complications: Secondary | ICD-10-CM | POA: Diagnosis not present

## 2022-01-02 DIAGNOSIS — K439 Ventral hernia without obstruction or gangrene: Secondary | ICD-10-CM | POA: Insufficient documentation

## 2022-01-02 HISTORY — DX: Other specified postprocedural states: Z98.890

## 2022-01-02 HISTORY — DX: Supraventricular tachycardia: I47.1

## 2022-01-02 HISTORY — DX: Gout, unspecified: M10.9

## 2022-01-02 HISTORY — DX: Unspecified systolic (congestive) heart failure: I50.20

## 2022-01-02 HISTORY — DX: Chronic kidney disease, stage 3 unspecified: N18.30

## 2022-01-02 HISTORY — DX: Non-ST elevation (NSTEMI) myocardial infarction: I21.4

## 2022-01-02 HISTORY — DX: Diaphragmatic hernia without obstruction or gangrene: K44.9

## 2022-01-02 HISTORY — DX: Atherosclerosis of aorta: I70.0

## 2022-01-02 HISTORY — DX: Supraventricular tachycardia, unspecified: I47.10

## 2022-01-02 HISTORY — DX: Obesity, unspecified: E66.9

## 2022-01-02 HISTORY — DX: Paroxysmal atrial fibrillation: I48.0

## 2022-01-02 HISTORY — DX: Chronic respiratory failure, unspecified whether with hypoxia or hypercapnia: J96.10

## 2022-01-02 HISTORY — DX: Other cardiomyopathies: I42.8

## 2022-01-02 HISTORY — DX: Left bundle-branch block, unspecified: I44.7

## 2022-01-02 HISTORY — DX: Obstructive sleep apnea (adult) (pediatric): G47.33

## 2022-01-02 HISTORY — DX: Other specified health status: Z78.9

## 2022-01-02 LAB — GLUCOSE, CAPILLARY
Glucose-Capillary: 153 mg/dL — ABNORMAL HIGH (ref 70–99)
Glucose-Capillary: 215 mg/dL — ABNORMAL HIGH (ref 70–99)

## 2022-01-02 SURGERY — CHOLECYSTECTOMY, ROBOT-ASSISTED, LAPAROSCOPIC
Anesthesia: General | Site: Abdomen

## 2022-01-02 MED ORDER — PROMETHAZINE HCL 25 MG/ML IJ SOLN: 6.2500 mg | INTRAMUSCULAR | Status: DC | PRN

## 2022-01-02 MED ORDER — HYDROCODONE-ACETAMINOPHEN 5-325 MG PO TABS: 1.0000 | ORAL_TABLET | ORAL | 0 refills | Status: DC | PRN

## 2022-01-02 MED ORDER — KETAMINE HCL 50 MG/5ML IJ SOSY
PREFILLED_SYRINGE | INTRAMUSCULAR | Status: AC
  Filled 2022-01-02: qty 5

## 2022-01-02 MED ORDER — CHLORHEXIDINE GLUCONATE 0.12 % MT SOLN
OROMUCOSAL | Status: AC
  Administered 2022-01-02: 15 mL via OROMUCOSAL
  Filled 2022-01-02: qty 15

## 2022-01-02 MED ORDER — FENTANYL CITRATE (PF) 100 MCG/2ML IJ SOLN
25.0000 ug | INTRAMUSCULAR | Status: DC | PRN
  Administered 2022-01-02 (×2): 25 ug via INTRAVENOUS

## 2022-01-02 MED ORDER — LIDOCAINE HCL (CARDIAC) PF 100 MG/5ML IV SOSY
PREFILLED_SYRINGE | INTRAVENOUS | Status: DC | PRN
Start: 2022-01-02 — End: 2022-01-02
  Administered 2022-01-02: 100 mg via INTRAVENOUS

## 2022-01-02 MED ORDER — CHLORHEXIDINE GLUCONATE CLOTH 2 % EX PADS: 6.0000 | MEDICATED_PAD | Freq: Once | CUTANEOUS | Status: DC

## 2022-01-02 MED ORDER — INDOCYANINE GREEN 25 MG IV SOLR
5.0000 mg | Freq: Once | INTRAVENOUS | Status: AC
  Administered 2022-01-02: 5 mg via INTRAVENOUS
  Filled 2022-01-02: qty 2

## 2022-01-02 MED ORDER — BUPIVACAINE LIPOSOME 1.3 % IJ SUSP: 20.0000 mL | Freq: Once | INTRAMUSCULAR | Status: DC

## 2022-01-02 MED ORDER — PHENYLEPHRINE 80 MCG/ML (10ML) SYRINGE FOR IV PUSH (FOR BLOOD PRESSURE SUPPORT)
PREFILLED_SYRINGE | INTRAVENOUS | Status: AC
  Filled 2022-01-02: qty 10

## 2022-01-02 MED ORDER — DROPERIDOL 2.5 MG/ML IJ SOLN: 0.6250 mg | Freq: Once | INTRAMUSCULAR | Status: DC | PRN

## 2022-01-02 MED ORDER — DEXAMETHASONE SODIUM PHOSPHATE 10 MG/ML IJ SOLN
INTRAMUSCULAR | Status: AC
  Filled 2022-01-02: qty 1

## 2022-01-02 MED ORDER — MIDAZOLAM HCL 2 MG/2ML IJ SOLN
INTRAMUSCULAR | Status: AC
  Filled 2022-01-02: qty 2

## 2022-01-02 MED ORDER — SODIUM CHLORIDE 0.9 % IV SOLN
INTRAVENOUS | Status: DC
Start: 2022-01-02 — End: 2022-01-02

## 2022-01-02 MED ORDER — ROCURONIUM BROMIDE 100 MG/10ML IV SOLN
INTRAVENOUS | Status: DC | PRN
  Administered 2022-01-02: 10 mg via INTRAVENOUS
  Administered 2022-01-02: 60 mg via INTRAVENOUS

## 2022-01-02 MED ORDER — CHLORHEXIDINE GLUCONATE CLOTH 2 % EX PADS
6.0000 | MEDICATED_PAD | Freq: Once | CUTANEOUS | Status: AC
  Administered 2022-01-02: 6 via TOPICAL

## 2022-01-02 MED ORDER — BUPIVACAINE-EPINEPHRINE (PF) 0.25% -1:200000 IJ SOLN
INTRAMUSCULAR | Status: AC
  Filled 2022-01-02: qty 30

## 2022-01-02 MED ORDER — ORAL CARE MOUTH RINSE: 15.0000 mL | Freq: Once | OROMUCOSAL | Status: AC

## 2022-01-02 MED ORDER — ONDANSETRON HCL 4 MG/2ML IJ SOLN
INTRAMUSCULAR | Status: AC
  Filled 2022-01-02: qty 2

## 2022-01-02 MED ORDER — BUPIVACAINE LIPOSOME 1.3 % IJ SUSP
INTRAMUSCULAR | Status: DC | PRN
  Administered 2022-01-02: 50 mL via INTRAMUSCULAR

## 2022-01-02 MED ORDER — MIDAZOLAM HCL 2 MG/2ML IJ SOLN
INTRAMUSCULAR | Status: DC | PRN
  Administered 2022-01-02 (×2): 1 mg via INTRAVENOUS

## 2022-01-02 MED ORDER — CHLORHEXIDINE GLUCONATE 0.12 % MT SOLN: 15.0000 mL | Freq: Once | OROMUCOSAL | Status: AC

## 2022-01-02 MED ORDER — ONDANSETRON HCL 4 MG/2ML IJ SOLN
INTRAMUSCULAR | Status: DC | PRN
  Administered 2022-01-02: 4 mg via INTRAVENOUS

## 2022-01-02 MED ORDER — DEXAMETHASONE SODIUM PHOSPHATE 10 MG/ML IJ SOLN
INTRAMUSCULAR | Status: DC | PRN
Start: 2022-01-02 — End: 2022-01-02
  Administered 2022-01-02 (×2): 5 mg via INTRAVENOUS

## 2022-01-02 MED ORDER — ACETAMINOPHEN 500 MG PO TABS: 1000.0000 mg | ORAL_TABLET | ORAL | Status: AC

## 2022-01-02 MED ORDER — ROCURONIUM BROMIDE 10 MG/ML (PF) SYRINGE
PREFILLED_SYRINGE | INTRAVENOUS | Status: AC
  Filled 2022-01-02: qty 10

## 2022-01-02 MED ORDER — LIDOCAINE HCL (PF) 2 % IJ SOLN
INTRAMUSCULAR | Status: AC
  Filled 2022-01-02: qty 5

## 2022-01-02 MED ORDER — GABAPENTIN 300 MG PO CAPS
ORAL_CAPSULE | ORAL | Status: AC
  Administered 2022-01-02: 300 mg via ORAL
  Filled 2022-01-02: qty 1

## 2022-01-02 MED ORDER — PROPOFOL 10 MG/ML IV BOLUS
INTRAVENOUS | Status: AC
  Filled 2022-01-02: qty 20

## 2022-01-02 MED ORDER — FENTANYL CITRATE (PF) 100 MCG/2ML IJ SOLN
INTRAMUSCULAR | Status: AC
  Filled 2022-01-02: qty 2

## 2022-01-02 MED ORDER — ACETAMINOPHEN 500 MG PO TABS
ORAL_TABLET | ORAL | Status: AC
  Administered 2022-01-02: 1000 mg via ORAL
  Filled 2022-01-02: qty 2

## 2022-01-02 MED ORDER — ACETAMINOPHEN 10 MG/ML IV SOLN: 1000.0000 mg | Freq: Once | INTRAVENOUS | Status: DC | PRN

## 2022-01-02 MED ORDER — OXYCODONE HCL 5 MG/5ML PO SOLN: 5.0000 mg | Freq: Once | ORAL | Status: DC | PRN

## 2022-01-02 MED ORDER — PHENYLEPHRINE 80 MCG/ML (10ML) SYRINGE FOR IV PUSH (FOR BLOOD PRESSURE SUPPORT)
PREFILLED_SYRINGE | INTRAVENOUS | Status: DC | PRN
  Administered 2022-01-02: 160 ug via INTRAVENOUS
  Administered 2022-01-02 (×2): 80 ug via INTRAVENOUS
  Administered 2022-01-02: 160 ug via INTRAVENOUS
  Administered 2022-01-02: 80 ug via INTRAVENOUS
  Administered 2022-01-02: 160 ug via INTRAVENOUS
  Administered 2022-01-02: 80 ug via INTRAVENOUS

## 2022-01-02 MED ORDER — SUGAMMADEX SODIUM 500 MG/5ML IV SOLN
INTRAVENOUS | Status: DC | PRN
  Administered 2022-01-02: 300 mg via INTRAVENOUS

## 2022-01-02 MED ORDER — PROPOFOL 10 MG/ML IV BOLUS
INTRAVENOUS | Status: DC | PRN
  Administered 2022-01-02 (×2): 50 mg via INTRAVENOUS

## 2022-01-02 MED ORDER — OXYCODONE HCL 5 MG PO TABS: 5.0000 mg | ORAL_TABLET | Freq: Once | ORAL | Status: DC | PRN

## 2022-01-02 MED ORDER — GABAPENTIN 300 MG PO CAPS
ORAL_CAPSULE | ORAL | Status: AC
  Filled 2022-01-02: qty 1

## 2022-01-02 MED ORDER — GABAPENTIN 300 MG PO CAPS: 300.0000 mg | ORAL_CAPSULE | ORAL | Status: AC

## 2022-01-02 MED ORDER — FENTANYL CITRATE (PF) 100 MCG/2ML IJ SOLN
INTRAMUSCULAR | Status: AC
  Administered 2022-01-02: 50 ug via INTRAVENOUS
  Filled 2022-01-02: qty 2

## 2022-01-02 MED ORDER — PHENYLEPHRINE HCL-NACL 20-0.9 MG/250ML-% IV SOLN
INTRAVENOUS | Status: AC
  Filled 2022-01-02: qty 250

## 2022-01-02 MED ORDER — BUPIVACAINE LIPOSOME 1.3 % IJ SUSP
INTRAMUSCULAR | Status: AC
  Filled 2022-01-02: qty 20

## 2022-01-02 MED ORDER — 0.9 % SODIUM CHLORIDE (POUR BTL) OPTIME
TOPICAL | Status: DC | PRN
  Administered 2022-01-02: 500 mL

## 2022-01-02 MED ORDER — FENTANYL CITRATE (PF) 100 MCG/2ML IJ SOLN
INTRAMUSCULAR | Status: DC | PRN
  Administered 2022-01-02: 50 ug via INTRAVENOUS
  Administered 2022-01-02: 100 ug via INTRAVENOUS
  Administered 2022-01-02: 50 ug via INTRAVENOUS

## 2022-01-02 MED ORDER — EPHEDRINE 5 MG/ML INJ
INTRAVENOUS | Status: AC
  Filled 2022-01-02: qty 5

## 2022-01-02 MED ORDER — PHENYLEPHRINE HCL-NACL 20-0.9 MG/250ML-% IV SOLN
INTRAVENOUS | Status: DC | PRN
  Administered 2022-01-02: 50 ug/min via INTRAVENOUS

## 2022-01-02 MED ORDER — EPHEDRINE SULFATE (PRESSORS) 50 MG/ML IJ SOLN
INTRAMUSCULAR | Status: DC | PRN
  Administered 2022-01-02: 5 mg via INTRAVENOUS

## 2022-01-02 MED ORDER — KETAMINE HCL 10 MG/ML IJ SOLN
INTRAMUSCULAR | Status: DC | PRN
  Administered 2022-01-02: 10 mg via INTRAVENOUS
  Administered 2022-01-02: 40 mg via INTRAVENOUS

## 2022-01-02 MED ORDER — CEFAZOLIN IN SODIUM CHLORIDE 3-0.9 GM/100ML-% IV SOLN
3.0000 g | INTRAVENOUS | Status: AC
  Administered 2022-01-02: 3 g via INTRAVENOUS
  Filled 2022-01-02: qty 100

## 2022-01-02 SURGICAL SUPPLY — 69 items
ADH SKN CLS APL DERMABOND .7 (GAUZE/BANDAGES/DRESSINGS) ×1
APL PRP STRL LF DISP 70% ISPRP (MISCELLANEOUS) ×1
BAG RETRIEVAL 10 (BASKET) ×1
BLADE SURG SZ11 CARB STEEL (BLADE) ×2 IMPLANT
CANNULA REDUC XI 12-8 STAPL (CANNULA) ×1
CANNULA REDUCER 12-8 DVNC XI (CANNULA) ×1 IMPLANT
CATH REDDICK CHOLANGI 4FR 50CM (CATHETERS) IMPLANT
CHLORAPREP W/TINT 26 (MISCELLANEOUS) ×2 IMPLANT
CLIP LIGATING HEMO O LOK GREEN (MISCELLANEOUS) ×2 IMPLANT
COVER TIP SHEARS 8 DVNC (MISCELLANEOUS) ×1 IMPLANT
COVER TIP SHEARS 8MM DA VINCI (MISCELLANEOUS) ×1
COVER WAND RF STERILE (DRAPES) ×2 IMPLANT
DERMABOND ADVANCED (GAUZE/BANDAGES/DRESSINGS) ×1
DERMABOND ADVANCED .7 DNX12 (GAUZE/BANDAGES/DRESSINGS) ×1 IMPLANT
DRAPE 3/4 80X56 (DRAPES) ×2 IMPLANT
DRAPE ARM DVNC X/XI (DISPOSABLE) ×4 IMPLANT
DRAPE COLUMN DVNC XI (DISPOSABLE) ×1 IMPLANT
DRAPE DA VINCI XI ARM (DISPOSABLE) ×4
DRAPE DA VINCI XI COLUMN (DISPOSABLE) ×1
ELECT CAUTERY BLADE 6.4 (BLADE) ×2 IMPLANT
ELECT REM PT RETURN 9FT ADLT (ELECTROSURGICAL) ×2
ELECTRODE REM PT RTRN 9FT ADLT (ELECTROSURGICAL) ×1 IMPLANT
GLOVE BIO SURGEON STRL SZ7 (GLOVE) ×4 IMPLANT
GOWN STRL REUS W/ TWL LRG LVL3 (GOWN DISPOSABLE) ×4 IMPLANT
GOWN STRL REUS W/TWL LRG LVL3 (GOWN DISPOSABLE) ×8
GRASPER SUT TROCAR 14GX15 (MISCELLANEOUS) ×2 IMPLANT
IRRIGATION STRYKERFLOW (MISCELLANEOUS) IMPLANT
IRRIGATOR STRYKERFLOW (MISCELLANEOUS) ×2
IV CATH ANGIO 12GX3 LT BLUE (NEEDLE) IMPLANT
IV NS 1000ML (IV SOLUTION)
IV NS 1000ML BAXH (IV SOLUTION) IMPLANT
KIT PINK PAD W/HEAD ARE REST (MISCELLANEOUS) ×2
KIT PINK PAD W/HEAD ARM REST (MISCELLANEOUS) ×1 IMPLANT
LABEL OR SOLS (LABEL) ×2 IMPLANT
MANIFOLD NEPTUNE II (INSTRUMENTS) ×2 IMPLANT
MESH VENTRALEX ST 2.5 CRC MED (Mesh General) ×1 IMPLANT
NEEDLE HYPO 22GX1.5 SAFETY (NEEDLE) ×2 IMPLANT
NS IRRIG 500ML POUR BTL (IV SOLUTION) ×2 IMPLANT
OBTURATOR OPTICAL STANDARD 8MM (TROCAR) ×1
OBTURATOR OPTICAL STND 8 DVNC (TROCAR) ×1
OBTURATOR OPTICALSTD 8 DVNC (TROCAR) ×1 IMPLANT
PACK LAP CHOLECYSTECTOMY (MISCELLANEOUS) ×2 IMPLANT
PENCIL ELECTRO HAND CTR (MISCELLANEOUS) ×2 IMPLANT
SEAL CANN UNIV 5-8 DVNC XI (MISCELLANEOUS) ×3 IMPLANT
SEAL XI 5MM-8MM UNIVERSAL (MISCELLANEOUS) ×3
SET TUBE SMOKE EVAC HIGH FLOW (TUBING) ×2 IMPLANT
SOLUTION ELECTROLUBE (MISCELLANEOUS) ×2 IMPLANT
SPIKE FLUID TRANSFER (MISCELLANEOUS) ×2 IMPLANT
SPONGE T-LAP 18X18 ~~LOC~~+RFID (SPONGE) ×2 IMPLANT
SPONGE T-LAP 4X18 ~~LOC~~+RFID (SPONGE) IMPLANT
STAPLER CANNULA SEAL DVNC XI (STAPLE) ×1 IMPLANT
STAPLER CANNULA SEAL XI (STAPLE) ×1
STOPCOCK 3 WAY MALE LL (IV SETS)
STOPCOCK 3WAY MALE LL (IV SETS) IMPLANT
SUT ETHIBOND NAB MO 7 #0 18IN (SUTURE) ×2 IMPLANT
SUT MNCRL 4-0 (SUTURE) ×2
SUT MNCRL 4-0 27XMFL (SUTURE) ×1
SUT MNCRL AB 4-0 PS2 18 (SUTURE) ×2 IMPLANT
SUT STRATAFIX PDS 30 CT-1 (SUTURE) ×2 IMPLANT
SUT VICRYL 0 AB UR-6 (SUTURE) ×4 IMPLANT
SUT VLOC 90 2/L VL 12 GS22 (SUTURE) ×4 IMPLANT
SUTURE MNCRL 4-0 27XMF (SUTURE) ×1 IMPLANT
SYR 20ML LL LF (SYRINGE) ×2 IMPLANT
SYR 30ML LL (SYRINGE) ×2 IMPLANT
SYS BAG RETRIEVAL 10MM (BASKET) ×1
SYSTEM BAG RETRIEVAL 10MM (BASKET) ×1 IMPLANT
TAPE TRANSPORE STRL 2 31045 (GAUZE/BANDAGES/DRESSINGS) ×2 IMPLANT
WATER STERILE IRR 3000ML UROMA (IV SOLUTION) IMPLANT
WATER STERILE IRR 500ML POUR (IV SOLUTION) ×2 IMPLANT

## 2022-01-02 NOTE — Anesthesia Procedure Notes (Signed)
Arterial Line Insertion ?Start/End5/11/2021 10:31 AM, 01/02/2022 10:31 AM ?Performed by: [name], MD, anesthesiologist ? Patient location: Pre-op. ?Preanesthetic checklist: patient identified, IV checked, site marked, risks and benefits discussed, surgical consent, monitors and equipment checked, pre-op evaluation, timeout performed and anesthesia consent ?Lidocaine 1% used for infiltration and patient sedated ?Left, radial was placed ?Catheter size: 20 G ?Hand hygiene performed  ? ?Attempts: 1 ?Procedure performed without using ultrasound guided technique. ?Following insertion, dressing applied and Biopatch. ?Post procedure assessment: normal and unchanged ? ?Patient tolerated the procedure well with no immediate complications. ? ? ?

## 2022-01-02 NOTE — Anesthesia Procedure Notes (Signed)
Procedure Name: Intubation ?Date/Time: 01/02/2022 10:37 AM ?Performed by: Tollie Eth, CRNA ?Pre-anesthesia Checklist: Patient identified, Patient being monitored, Timeout performed, Emergency Drugs available and Suction available ?Patient Re-evaluated:Patient Re-evaluated prior to induction ?Oxygen Delivery Method: Circle system utilized ?Preoxygenation: Pre-oxygenation with 100% oxygen ?Induction Type: IV induction ?Ventilation: Mask ventilation with difficulty, Two handed mask ventilation required and Oral airway inserted - appropriate to patient size ?Laryngoscope Size: McGraph and 4 ?Grade View: Grade I ?Tube type: Oral ?Tube size: 7.5 mm ?Number of attempts: 1 ?Airway Equipment and Method: Stylet and Video-laryngoscopy ?Placement Confirmation: ETT inserted through vocal cords under direct vision, positive ETCO2 and breath sounds checked- equal and bilateral ?Secured at: 21 cm ?Tube secured with: Tape ?Dental Injury: Teeth and Oropharynx as per pre-operative assessment  ? ? ? ? ?

## 2022-01-02 NOTE — Interval H&P Note (Signed)
History and Physical Interval Note: ? ?01/02/2022 ?9:12 AM ? ?Oscar [name]  has presented today for surgery, with the diagnosis of biliary pancreatitis, umbilical hernia incarcerated 5 cm.  The various methods of treatment have been discussed with the patient and family. After consideration of risks, benefits and other options for treatment, the patient has consented to  Procedure(s): ?XI ROBOTIC ASSISTED LAPAROSCOPIC CHOLECYSTECTOMY (N/A) ?INDOCYANINE GREEN FLUORESCENCE IMAGING (ICG) (N/A) ?XI ROBOT ASSISTED UMBILICAL HERNIA REPAIR, incarcerated (N/A) as a surgical intervention.  The patient's history has been reviewed, patient examined, no change in status, stable for surgery.  I have reviewed the patient's chart and labs.  Questions were answered to the patient's satisfaction.   ? ? ?Enola ? ? ?

## 2022-01-02 NOTE — Discharge Instructions (Addendum)
Laparoscopic Cholecystectomy, Care After  ? ?These instructions give you information on caring for yourself after your procedure. Your doctor may also give you more specific instructions. Call your doctor if you have any problems or questions after your procedure.  ?HOME CARE  ?Change your bandages (dressings) as told by your doctor.  ?Keep the wound dry and clean. Wash the wound gently with soap and water. Pat the wound dry with a clean towel.  ?Do not take baths, swim, or use hot tubs for 2 weeks, or as told by your doctor.  ?Only take medicine as told by your doctor.  ?Eat a normal diet as told by your doctor.  ?Do not lift anything heavier than 10 pounds (4.5 kg) until your doctor says it is okay.  ?Do not play contact sports for 1 week, or as told by your doctor. ?GET HELP IF:  ?Your wound is red, puffy (swollen), or painful.  ?You have yellowish-white fluid (pus) coming from the wound.  ?You have fluid draining from the wound for more than 1 day.  ?You have a bad smell coming from the wound.  ?Your wound breaks open. ?GET HELP RIGHT AWAY IF:  ?You have trouble breathing.  ?You have chest pain.  ?You have a fever >101  ?You have pain in the shoulders (shoulder strap areas) that is getting worse.  ?You feel dizzy or pass out (faint).  ?You have severe belly (abdominal) pain.  ?You feel sick to your stomach (nauseous) or throw up (vomit) for more than 1 day. ? ?AMBULATORY SURGERY  ?DISCHARGE INSTRUCTIONS ? ? ?The drugs that you were given will stay in your system until tomorrow so for the next 24 hours you should not: ? ?Drive an automobile ?Make any legal decisions ?Drink any alcoholic beverage ? ? ?You may resume regular meals tomorrow.  Today it is better to start with liquids and gradually work up to solid foods. ? ?You may eat anything you prefer, but it is better to start with liquids, then soup and crackers, and gradually work up to solid foods. ? ? ?Please notify your doctor immediately if you have any  unusual bleeding, trouble breathing, redness and pain at the surgery site, drainage, fever, or pain not relieved by medication. ? ? ? ?Additional Instructions: ? ? ?PLEASE LEAVE TEAL ARMBAND ON FOR 4 DAY! ? ? ? ?Please contact your physician with any problems or Same Day Surgery at [phone], Monday through Friday 6 am to 4 pm, or [place] at [facility] number at [phone].  ?  ?

## 2022-01-02 NOTE — Transfer of Care (Signed)
Immediate Anesthesia Transfer of Care Note ? ?Patient: Oscar [name] ? ?Procedure(s) Performed: XI ROBOTIC ASSISTED LAPAROSCOPIC CHOLECYSTECTOMY (Abdomen) ?INDOCYANINE GREEN FLUORESCENCE IMAGING (ICG) (Abdomen) ?XI ROBOT ASSISTED UMBILICAL HERNIA REPAIR, incarcerated (Abdomen) ? ?Patient Location: PACU ? ?Anesthesia Type:General ? ?Level of Consciousness: drowsy ? ?Airway & Oxygen Therapy: Patient Spontanous Breathing and Patient connected to face mask oxygen ? ?Post-op Assessment: Report given to RN and Post -op Vital signs reviewed and stable ? ?Post vital signs: Reviewed and stable ? ?Last Vitals:  ?Vitals Value Taken Time  ?BP 95/72 01/02/22 1230  ?Temp 36.7 ?C 01/02/22 1230  ?Pulse 95 01/02/22 1233  ?Resp 22 01/02/22 1233  ?SpO2 96 % 01/02/22 1233  ?Vitals shown include unvalidated device data. ? ?Last Pain:  ?Vitals:  ? 01/02/22 1230  ?TempSrc:   ?PainSc: Asleep  ?   ? ?  ? ?Complications: No notable events documented. ?

## 2022-01-02 NOTE — Op Note (Signed)
Robotic assisted laparoscopic Cholecystectomy with Open umbilical hernia repair w Ventralex ST Mesh 6.4 cm ? ?Pre-operative Diagnosis: HX gallstone pancreatitis and Incarcerated ventral hernia ? ?Post-operative Diagnosis: same ? ?Procedure:  ?Robotic assisted laparoscopic Cholecystectomywith Open umbilical hernia repair w Ventralex Mesh 6.4 cm ? ?Surgeon: Caroleen Hamman, MD FACS ? ?Anesthesia: Gen. with endotracheal tube ? ?Findings: ?Chronic mild Cholecystitis without abscess, pus or any other contraindication for mesh placement  ?3 cm umbilical hernia w incarcerated omentum ? ?Estimated Blood Loss: 20cc ?      ?Specimens: Gallbladder and sac     ?      ?Complications: none ? ? ?Procedure Details  ?The patient was seen again in the Holding Room. The benefits, complications, treatment options, and expected outcomes were discussed with the patient. The risks of bleeding, infection, recurrence of symptoms, failure to resolve symptoms, bile duct damage, bile duct leak, retained common bile duct stone, bowel injury, any of which could require further surgery and/or ERCP, stent, or papillotomy were reviewed with the patient. The likelihood of improving the patient's symptoms with return to their baseline status is good.  The patient and/or family concurred with the proposed plan, giving informed consent.  The patient was taken to Operating Room, identified  and the procedure verified as Laparoscopic Cholecystectomy. ? A Time Out was held and the above information confirmed. ? ?Prior to the induction of general anesthesia, antibiotic prophylaxis was administered. VTE prophylaxis was in place. General endotracheal anesthesia was then administered and tolerated well. After the induction, the abdomen was prepped with Chloraprep and draped in the sterile fashion. The patient was positioned in the supine position. ? ?Periumbilical incision was created a large omentum was found incarcerated sac dissected and freed from omentum.  Fascia was cleaned and I entered the abdominal cavity  under direct visualization and a Hasson trochar was placed after two vicryl stitches were anchored to the fascia. Pneumoperitoneum was then created with CO2 and tolerated well without any adverse changes in the patient's vital signs.  Three 8-mm ports were placed under direct vision. All skin incisions  were infiltrated with a local anesthetic agent before making the incision and placing the trocars.  ? ?The patient was positioned  in reverse Trendelenburg, robot was brought to the surgical field and docked in the standard fashion.  We made sure all the instrumentation was kept indirect view at all times and that there were no collision between the arms. ?I scrubbed out and went to the console. ? ?The gallbladder was identified, the fundus grasped and retracted cephalad. Adhesions were lysed bluntly. The infundibulum was grasped and retracted laterally, exposing the peritoneum overlying the triangle of Calot. This was then divided and exposed in a blunt fashion. An extended critical view of the cystic duct and cystic artery was obtained.  The cystic duct was clearly identified and bluntly dissected.   Artery and duct were double clipped and divided. ?Using ICG cholangiography we visualize the cystic duct and CBD No evidence of bile injuries. ?The gallbladder was taken from the gallbladder fossa in a retrograde fashion with the electrocautery.  ?Hemostasis was achieved with the electrocautery. nspection of the right upper quadrant was performed. No bleeding, bile duct injury or leak, or bowel injury was noted. ?Robotic instruments and robotic arms were undocked in the standard fashion.  I scrubbed back in. ?Liposomal marcaine  infiltrated on all incisions. ? ?The gallbladder was removed and placed in an Endocatch bag.  ? ?Pneumoperitoneum was released.  ?The hernia defect  was cleaned, a ventralex patch 6.4 cm was placed and anchored w transfascial sutures. The  defect was closed with multiple interrupted 0 ethibonds . ? ?4-0 subcuticular Monocryl was used to close the skin. Dermabond was  applied.  The patient was then extubated and brought to the recovery room in stable condition. Sponge, lap, and needle counts were correct at closure and at the conclusion of the case.  ?        ?     ?Caroleen Hamman, MD, FACS  ?

## 2022-01-03 LAB — SURGICAL PATHOLOGY

## 2022-01-03 NOTE — Anesthesia Postprocedure Evaluation (Signed)
Anesthesia Post Note ? ?Patient: Oscar [name] ? ?Procedure(s) Performed: XI ROBOTIC ASSISTED LAPAROSCOPIC CHOLECYSTECTOMY (Abdomen) ?INDOCYANINE GREEN FLUORESCENCE IMAGING (ICG) (Abdomen) ?XI ROBOT ASSISTED UMBILICAL HERNIA REPAIR, incarcerated (Abdomen) ? ?Patient location during evaluation: PACU ?Anesthesia Type: General ?Level of consciousness: awake and alert ?Pain management: pain level controlled ?Vital Signs Assessment: post-procedure vital signs reviewed and stable ?Respiratory status: spontaneous breathing, nonlabored ventilation and respiratory function stable ?Cardiovascular status: blood pressure returned to baseline and stable ?Postop Assessment: no apparent nausea or vomiting ?Anesthetic complications: no ? ? ?No notable events documented. ? ? ?Last Vitals:  ?Vitals:  ? 01/02/22 1326 01/02/22 1339  ?BP: 99/66 98/64  ?Pulse: 83 85  ?Resp: 11 14  ?Temp: 36.6 ?C (!) 36.1 ?C  ?SpO2: 91% 95%  ?  ?Last Pain:  ?Vitals:  ? 01/03/22 0901  ?TempSrc:   ?PainSc: 2   ? ? ?  ?  ?  ?  ?  ?  ? ?Oscar [name] ? ? ? ? ?

## 2022-01-07 ENCOUNTER — Encounter: Payer: Self-pay | Admitting: Physician Assistant

## 2022-01-07 ENCOUNTER — Ambulatory Visit (INDEPENDENT_AMBULATORY_CARE_PROVIDER_SITE_OTHER): Payer: Medicare Other | Admitting: Physician Assistant

## 2022-01-07 DIAGNOSIS — K429 Umbilical hernia without obstruction or gangrene: Secondary | ICD-10-CM

## 2022-01-07 DIAGNOSIS — Z09 Encounter for follow-up examination after completed treatment for conditions other than malignant neoplasm: Secondary | ICD-10-CM

## 2022-01-07 DIAGNOSIS — K8010 Calculus of gallbladder with chronic cholecystitis without obstruction: Secondary | ICD-10-CM

## 2022-01-07 MED ORDER — OXYCODONE HCL 5 MG PO TABS: 5.0000 mg | ORAL_TABLET | ORAL | 0 refills | Status: DC | PRN

## 2022-01-07 NOTE — Patient Instructions (Signed)
Follow up Monday 05/22 at 315PM ?

## 2022-01-07 NOTE — Progress Notes (Signed)
[facility] ?POST-OP OFFICE VISIT ? ?01/07/2022 ? ?HPI: ?Oscar [name] is a 46 y.o. male 5 days s/p robotic assisted laparoscopic cholecystectomy and open umbilical hernia repair for gallstone pancreatitis with Dr Dahlia Byes.  ? ?He continues to have abdominal pain; mostly at umbilicus. He reports having a "fullness" in the area and discomfort with certain positions. At home, he has hydrocodone but this is not helping well. He states that in the past oxycodone has helped better. He is using tylenol and ice packs as well. He can not take ibuprofen.  ? ?He is constipated. Tried Miralax once without improvement.  ? ?No fever, chills, nausea, emesis ?No issues with incisions ?He has a good appetite ?No other complaints  ? ?Physical Exam: ?Constitutional: Well appearing male, NAD ?Abdomen: Soft, incisional soreness worse at umbilicus, non-distended, no rebound/guarding ?Skin: Laparoscopic incisions are healing well, dermabond intact, no erythema or drainage. He has expected induration at his umbilical incision, no evidence of recurrence. No evidence of infection  ? ?Assessment/Plan: ?This is a 47 y.o. male 5 days s/p robotic assisted laparoscopic cholecystectomy and open umbilical hernia repair for gallstone pancreatitis ? ? - Pain control prn; I will switch hydrocodone to oxycodone at patient preference. Continue to supplement with tylenol and ice packs.  ? - Miralax BID +/- colace to help with bowel function. Educated on constipating side effect of opioids  ? - Reviewed wound care recommendation ? - Reviewed lifting restrictions; 6 weeks total ? - Reviewed surgical pathology; chronic cholecystitis  ? - He will follow up on 05/22. He and his wife understand to call with questions.concerns in the interim  ? ?-- ?Edison Simon, PA-C ?[facility] ?01/07/2022, 2:59 PM ?M-F: 7am - 4pm ? ?

## 2022-01-13 LAB — MISC LABCORP TEST (SEND OUT)
Labcorp test code: 252773
Labcorp test code: 252780

## 2022-01-16 ENCOUNTER — Encounter: Payer: Medicare Other | Admitting: Physician Assistant

## 2022-01-20 ENCOUNTER — Encounter: Payer: Medicare Other | Admitting: Surgery

## 2022-01-21 LAB — MISC LABCORP TEST (SEND OUT): Labcorp test code: 252900

## 2022-01-29 ENCOUNTER — Encounter: Payer: Self-pay | Admitting: Surgery

## 2022-01-29 ENCOUNTER — Ambulatory Visit (INDEPENDENT_AMBULATORY_CARE_PROVIDER_SITE_OTHER): Payer: Medicare Other | Admitting: Surgery

## 2022-01-29 VITALS — BP 120/70 | HR 52 | Temp 97.7°F | Ht 66.0 in | Wt 269.0 lb

## 2022-01-29 DIAGNOSIS — Z09 Encounter for follow-up examination after completed treatment for conditions other than malignant neoplasm: Secondary | ICD-10-CM

## 2022-01-29 DIAGNOSIS — K8010 Calculus of gallbladder with chronic cholecystitis without obstruction: Secondary | ICD-10-CM

## 2022-01-29 NOTE — Patient Instructions (Signed)

## 2022-01-30 ENCOUNTER — Encounter: Payer: Self-pay | Admitting: Surgery

## 2022-01-30 NOTE — Progress Notes (Signed)
Oscar [name] is 3-1/2 weeks out following Robotic cholecystectomy repair. He actually is doing much better. Prior abdominal pain has resolved. No fevers or chills Tolerating diet Path d/w pt  PE NAD Abd: soft, minimal appropriate incisional tenderness, no seromas, no infection, no recurrence  A/P Doing well w/o complications RTC prn

## 2022-02-03 ENCOUNTER — Encounter: Payer: Medicare Other | Admitting: Surgery

## 2022-02-12 ENCOUNTER — Institutional Professional Consult (permissible substitution): Payer: Medicare Other | Admitting: Pulmonary Disease

## 2022-02-12 ENCOUNTER — Ambulatory Visit: Payer: Medicare Other | Admitting: Gastroenterology

## 2022-02-20 ENCOUNTER — Ambulatory Visit: Payer: Medicare Other | Admitting: Podiatry

## 2022-02-24 ENCOUNTER — Encounter: Payer: Self-pay | Admitting: Podiatry

## 2022-02-24 ENCOUNTER — Ambulatory Visit (INDEPENDENT_AMBULATORY_CARE_PROVIDER_SITE_OTHER): Payer: Medicare Other | Admitting: Podiatry

## 2022-02-24 DIAGNOSIS — E119 Type 2 diabetes mellitus without complications: Secondary | ICD-10-CM

## 2022-02-24 DIAGNOSIS — Z794 Long term (current) use of insulin: Secondary | ICD-10-CM

## 2022-02-24 DIAGNOSIS — M79676 Pain in unspecified toe(s): Secondary | ICD-10-CM | POA: Diagnosis not present

## 2022-02-24 DIAGNOSIS — B351 Tinea unguium: Secondary | ICD-10-CM | POA: Diagnosis not present

## 2022-02-25 ENCOUNTER — Telehealth: Payer: Self-pay | Admitting: Family

## 2022-03-14 ENCOUNTER — Ambulatory Visit (INDEPENDENT_AMBULATORY_CARE_PROVIDER_SITE_OTHER): Payer: Medicare Other | Admitting: Podiatry

## 2022-03-14 DIAGNOSIS — Z794 Long term (current) use of insulin: Secondary | ICD-10-CM

## 2022-03-14 DIAGNOSIS — E119 Type 2 diabetes mellitus without complications: Secondary | ICD-10-CM

## 2022-03-14 DIAGNOSIS — M2141 Flat foot [pes planus] (acquired), right foot: Secondary | ICD-10-CM

## 2022-03-14 DIAGNOSIS — M2142 Flat foot [pes planus] (acquired), left foot: Secondary | ICD-10-CM

## 2022-03-14 NOTE — Progress Notes (Unsigned)
Patient presents today for a Diabetic shoe measurement.  He stated he wears a size 13 wide shoe.  He had the following measurements 12.5 to toe, 15 arch, and wide width.  Dr. Erie Noe is his primary care doctor.  He's scheduled to see Dr. Clydene Laming in August.  He chose the Apex B500M shoes.  I informed him that we would contact him when we receive the shoes and inserts.

## 2022-04-23 ENCOUNTER — Encounter: Payer: Self-pay | Admitting: Pulmonary Disease

## 2022-04-23 ENCOUNTER — Ambulatory Visit (INDEPENDENT_AMBULATORY_CARE_PROVIDER_SITE_OTHER): Payer: Medicare Other | Admitting: Pulmonary Disease

## 2022-04-23 VITALS — BP 116/82 | HR 92 | Temp 97.7°F | Ht 66.0 in | Wt 280.0 lb

## 2022-04-23 DIAGNOSIS — J449 Chronic obstructive pulmonary disease, unspecified: Secondary | ICD-10-CM | POA: Diagnosis not present

## 2022-04-23 DIAGNOSIS — R0989 Other specified symptoms and signs involving the circulatory and respiratory systems: Secondary | ICD-10-CM

## 2022-04-23 DIAGNOSIS — F17210 Nicotine dependence, cigarettes, uncomplicated: Secondary | ICD-10-CM

## 2022-04-23 DIAGNOSIS — G4733 Obstructive sleep apnea (adult) (pediatric): Secondary | ICD-10-CM

## 2022-04-23 DIAGNOSIS — I5022 Chronic systolic (congestive) heart failure: Secondary | ICD-10-CM

## 2022-04-23 DIAGNOSIS — E66813 Obesity, class 3: Secondary | ICD-10-CM

## 2022-04-23 MED ORDER — ANORO ELLIPTA 62.5-25 MCG/ACT IN AEPB: 1.0000 | INHALATION_SPRAY | Freq: Every day | RESPIRATORY_TRACT | 0 refills | Status: DC

## 2022-04-23 MED ORDER — ANORO ELLIPTA 62.5-25 MCG/ACT IN AEPB: 1.0000 | INHALATION_SPRAY | Freq: Every day | RESPIRATORY_TRACT | 6 refills | Status: DC

## 2022-04-23 NOTE — Progress Notes (Signed)
Subjective:    Patient ID: Oscar [name], male    DOB: [birth year], 46 y.o.   MRN: [id number] Patient Care Team: Theotis Burrow, MD as PCP - General (Family Medicine) Alisa Graff, FNP as Nurse Practitioner (Cardiology) Yolonda Kida, MD as Consulting Physician (Cardiology)  Chief Complaint  Patient presents with   Pulmonary Consult    Referred by Caroleen Hamman, MD. Pt states he was dx with COPD several years ago. He states breathing is overall stable now. He has a prod cough with brown sputum and occ wheezing.     HPI This is a 46 year old current smoker (half PPD) with a history as noted below, who presents for evaluation and management of COPD.  He is kindly referred by Dr. Caroleen Hamman.  Patient has been on Incruse Ellipta and as needed albuterol.  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  He does not note any issues with dyspnea over his baseline.  Dyspnea is usually with heavy exertion and relieved by rest.  He has a very severe ischemic cardiomyopathy with LVEF of less than 20%.  He was diagnosed with severe obstructive sleep apnea in 2016 and has been on CPAP at 14 cm of water pressure.  He does not note any difficulties with his CPAP currently.  He does not endorse any cough, sputum production, or hemoptysis.  He states that he is here because Dr. Dahlia Byes wanted him "checked out".  He is currently followed by the CHF clinic.  His cardiologist is Dr. [name].  I have reviewed the available records and imaging.  A complete review of systems was performed but the patient does not have any active complaints today.  States that he has oxygen at home previously prescribed by Dr. [name] but he does not use it regularly.  Patient is a difficult historian and no further history is gleaned.  DATA 01/26/2015 sleep study [facility]: Severe sleep apnea AHI 49.9/h, REM related AHI 81.6/h desat to 53.6% during sleep.  Patient titrated to  CPAP of 14 cm H2O. 11/23/2020 echocardiogram: LVEF less than 20%.  Left ventricular function severely depressed.  Grade 1 DD, moderately reduced right ventricular systolic function, moderately elevated pulmonary artery systolic pressure biatrial enlargement.  Mild MR.  Review of Systems A 10 point review of systems was performed and it is as noted above otherwise negative.  Past Medical History:  Diagnosis Date   AICD (automatic cardioverter/defibrillator) present    Aortic atherosclerosis (HCC)    Asthma    Chronic respiratory failure (HCC)    CKD (chronic kidney disease), stage III (HCC)    COPD (chronic obstructive pulmonary disease) (HCC)    Deafness in right ear    Diabetes mellitus without complication (HCC)    Dyspnea    GERD (gastroesophageal reflux disease)    Gout    HFrEF (heart failure with reduced ejection fraction) ([place])    a.) TTE 12/10/14: EF 25%; diff inf HK; sev LV and mod LA dil; LVH. b.) TTE 06/23/18: EF 20-25%; LVH; mild LV dil, mild BAE. c.) TTE 03/01/19: EF 15%, LVH, BAE; triv PR/TR. d.) TTE 02/27/20: EF < 20%; sev LV dil; mild MR. e.) TTE 11/23/20: EF < 20%; glob HK; sev LV dil; LVH; mild-mod BAE; mod-sev TR, triv AR; G1DD. f.) TTE 05/14/21: EF <15%; LVH; sev LA and mild RV enlar; triv AR/PR, mild TR, mod MR.   Hiatal hernia    History of  cardiac catheterization    a.) R/LHC 12/14/2009: normal coronaries. b.) R/LHC 12/11/2014: normal coronaries.   Hyperlipidemia    Hypertension    Hypoxemia    LBBB (left bundle branch block)    NICM (nonischemic cardiomyopathy; dilated cardiomyopathy) ([place])    a.) R/LHC 12/14/2009: normal cors; LVEDP 18 mmHg, mean PA 29 mmHg, mean PCWP 31 mmHg; CO 6 L/min; CI 2.54 L/min/m. b.) TTE 12/10/2014: EF 25%. c.) R/LHC 12/11/2014: mean RA 9 mmHg, mean PA 22 mmHg, mean PCWP 22 mmHg. d.) TTE 06/23/2018: EF 20-25%. e.) TTE 03/01/2019: EF 15%. f.) TTE 02/27/2020: < 20%. g.) TTE 11/23/2020: EF <20%. h.) TTE 05/14/2021: < 15%.   NSTEMI (non-ST  elevated myocardial infarction) ([place])    a.) x 3 per patient report ---> 2012, 2014, 2016   Obesity    On supplemental oxygen by nasal cannula    a.) 2-3 L/[place] PRN   OSA on CPAP    PAF (paroxysmal atrial fibrillation) ([place])    a.) CHA2DS2-VASc = 4 (HFrEF, HTN, prior MI, T2DM). b.) rate/rhythm maintained without pharmacologial intervention; no current anticoagulation.   Pancreatitis    PSVT (paroxysmal supraventricular tachycardia) [facility])    Past Surgical History:  Procedure Laterality Date   BIOPSY  11/25/2021   Procedure: BIOPSY;  Surgeon: Rush Landmark Telford Nab., MD;  Location: [facility];  Service: Gastroenterology;;   CARDIAC CATHETERIZATION  12/11/2014   Procedure: RIGHT/LEFT HEART CATH AND CORONARY ANGIOGRAPHY;  Surgeon: Lorretta Harp, MD;  Location: [facility] CATH LAB;  Service: Cardiovascular;;   COLONOSCOPY WITH PROPOFOL N/A 11/13/2021   Procedure: COLONOSCOPY WITH PROPOFOL;  Surgeon: Lin Landsman, MD;  Location: [facility] ENDOSCOPY;  Service: Gastroenterology;  Laterality: N/A;   ESOPHAGOGASTRODUODENOSCOPY (EGD) WITH PROPOFOL N/A 11/25/2021   Procedure: ESOPHAGOGASTRODUODENOSCOPY (EGD) WITH PROPOFOL;  Surgeon: Rush Landmark Telford Nab., MD;  Location: [facility] ENDOSCOPY;  Service: Gastroenterology;  Laterality: N/A;   ICD LEAD REMOVAL N/A 03/30/2015   Procedure: ICD LEAD REMOVAL;  Surgeon: Marzetta Board, MD;  Location: [facility] ORS;  Service: Cardiovascular;  Laterality: N/A;   IMPLANTABLE CARDIOVERTER DEFIBRILLATOR IMPLANT     INCISION AND DRAINAGE ABSCESS N/A 12/12/2020   Procedure: INCISION AND DRAINAGE ABSCESS;  Surgeon: Jules Husbands, MD;  Location: [facility] ORS;  Service: General;  Laterality: N/A;   INSERT / REPLACE / [place] N/A 12/09/2014   Procedure: LEFT HEART CATHETERIZATION WITH CORONARY ANGIOGRAM;  Surgeon: Burnell Blanks, MD;  Location: [facility] CATH LAB;  Service: Cardiovascular;  Laterality: N/A;   RIGHT/LEFT  HEART CATH AND CORONARY ANGIOGRAPHY Bilateral 12/14/2009   Procedure: RIGHT/LEFT HEART CATH AND CORONARY ANGIOGRAPHY; Location: [place]; Surgeon: Katrine Coho, MD   TONSILLECTOMY     as a child , adnoids removed   UPPER ESOPHAGEAL ENDOSCOPIC ULTRASOUND (EUS) N/A 11/25/2021   Procedure: UPPER ESOPHAGEAL ENDOSCOPIC ULTRASOUND (EUS);  Surgeon: Irving Copas., MD;  Location: [facility];  Service: Gastroenterology;  Laterality: N/A;   Patient Active Problem List   Diagnosis Date Noted   Colon cancer screening    Polyp of sigmoid colon    Idiopathic gout, unspecified site 09/26/2021   Implantable cardioverter-defibrillator (ICD) in situ 09/26/2021   Chronic kidney disease, stage 3a ([place]) 09/26/2021   Hyperlipidemia, unspecified 09/26/2021   Perineal abscess 12/12/2020   Flash pulmonary edema ([place])    Pulmonary edema 11/25/2020   Obesity, Class III, BMI 40-49.9 (morbid obesity) ([place]) 11/24/2020   Acute on chronic diastolic (congestive) heart failure ([place]) 11/24/2020  Acute exacerbation of CHF (congestive heart failure) ([place]) 11/23/2020   Hepatic cirrhosis, unspecified hepatic cirrhosis type, unspecified whether ascites present ([place]) 11/23/2020   Type 2 diabetes mellitus with other diabetic neurological complication ([place]) 16/06/9603   Obesity hypoventilation syndrome ([place]) 11/23/2020   Atrial fibrillation with RVR ([place]) 11/23/2020   ARF (acute renal failure) ([place]) 03/28/2020   Acute on chronic combined systolic (congestive) and diastolic (congestive) heart failure ([place]) 02/27/2020   COPD (chronic obstructive pulmonary disease) ([place]) 02/27/2020   Sprain of foot 01/26/2020   Foreign body granuloma of skin and subcutaneous tissue 05/26/2019   Acute pancreatitis 05/19/2017   Pancreatitis 09/12/2016   Bronchitis 05/20/2016   Constipation 11/08/2015   Abdominal pain 11/02/2015   Obstructive sleep apnea 07/31/2015   Tobacco use 07/31/2015   Diabetes ([place]) 07/10/2015   Chronic  systolic heart failure ([place]) 03/30/2015   Cardiac pacemaker 03/30/2015   Chronic respiratory failure with hypercapnia ([place]) 12/12/2014   Elevated troponin    Non-ischemic cardiomyopathy (HCC)    Hypoxemia    Acute combined systolic and diastolic heart failure (HCC)    SVT (supraventricular tachycardia) ([place]) 12/09/2014   NSTEMI (non-ST elevated myocardial infarction) ([place]) 12/09/2014   Hypertension 12/09/2014   Family History  Problem Relation Age of Onset   Hypertension Mother    Congestive Heart Failure Mother    Hypertension Sister    Diabetes Sister    Pancreatitis Sister    COPD Sister    Emphysema Sister        smoked   Pancreatitis Brother    Anemia Neg Hx    Arrhythmia Neg Hx    Asthma Neg Hx    Clotting disorder Neg Hx    Fainting Neg Hx    Heart attack Neg Hx    Heart disease Neg Hx    Heart failure Neg Hx    Hyperlipidemia Neg Hx    Social History   Tobacco Use   Smoking status: Every Day    Packs/day: 1.50    Years: 34.00    Total pack years: 51.00    Types: Cigarettes    Passive exposure: Past   Smokeless tobacco: Never   Tobacco comments:    1/[facility] ppd [place]   Substance Use Topics   Alcohol use: No   Allergies  Allergen Reactions   Ciprofloxacin Swelling and Other (See Comments)    Migraine Headache    Iodinated Contrast Media     Other reaction(s): Vomiting Projectile vomiting   Isosorb Dinitrate-Hydralazine Other (See Comments)    Migraine Headache   Current Meds  Medication Sig   acetaminophen (TYLENOL) 500 MG tablet Take 1,000 mg by mouth every 6 (six) hours as needed for moderate pain or mild pain.   allopurinol (ZYLOPRIM) 300 MG tablet Take 300 mg by mouth daily.   atorvastatin (LIPITOR) 40 MG tablet Take 40 mg by mouth daily.   canagliflozin (INVOKANA) 100 MG TABS tablet Take 100 mg by mouth every morning.   clindamycin (CLEOCIN-T) 1 % lotion Apply to affected areas daily after shower.   colchicine 0.6 MG tablet  Take 0.6 mg by mouth daily.   ketoconazole (NIZORAL) 2 % cream Apply twice daily to neck until clear   NON FORMULARY Pt uses a cpap nightly   ondansetron (ZOFRAN) 4 MG tablet Take 1 tablet (4 mg total) by mouth every 6 (six) hours as needed for nausea or vomiting.   pantoprazole (PROTONIX) 40 MG tablet Take 40 mg by mouth daily.  sacubitril-valsartan (ENTRESTO) 49-51 MG Take 0.5 tablets by mouth daily.   tazarotene (AVAGE) 0.1 % cream Apply topically at bedtime.   torsemide 40 MG TABS Take 40 mg by mouth 2 (two) times daily.   Umeclidinium 62.5 MCG/INH AEPB Inhale 1 puff into the lungs daily as needed (shortess of breath).   VENTOLIN HFA 108 (90 Base) MCG/ACT inhaler Inhale 2 puffs into the lungs every 6 (six) hours as needed for shortness of breath.   Immunization History  Administered Date(s) Administered   [employer] Vaccination 12/31/2019, 02/04/2020      Objective:   Physical Exam BP 116/82 (BP Location: Left Arm, Cuff Size: Large)   Pulse 92   Temp 97.7 F (36.5 C) (Oral)   Ht '5\' 6"'$  (1.676 m)   Wt 280 lb (127 kg)   SpO2 97% Comment: on RA  BMI 45.19 kg/m  GENERAL: Morbidly obese man, no acute distress.  Older than stated age.  No conversational dyspnea, fully ambulatory. HEAD: Normocephalic, atraumatic.  EYES: Pupils equal, round, reactive to light.  No scleral icterus.  MOUTH: Oral mucosa moist.  No thrush.  Mallampati class IV . NECK: Supple. No thyromegaly. Trachea midline. No JVD.  No adenopathy. PULMONARY: Good air entry bilaterally.  No adventitious sounds. CARDIOVASCULAR: S1 and S2. Regular rate and rhythm.  Grade 2/6 systolic ejection murmur left sternal border.  ICD present left chest. ABDOMEN: Obese, otherwise benign. MUSCULOSKELETAL: No joint deformity, no clubbing, no edema.  NEUROLOGIC: Neuro grossly normal. SKIN: Intact,warm,dry. PSYCH: Mood and behavior normal.  Ambulatory oximetry was performed today: Patient was only able to ambulate 2 laps  due to fatigue.  Oxygen saturations maintained at 96%.    Assessment & Plan:     ICD-10-CM   1. COPD suggested by initial evaluation ([place])  J44.9 Pulmonary Function Test [facility] Only   Will obtain PFTs Will discontinue Incruse Trial of Anoro 1 inhalation daily Continue as needed albuterol    2. Chronic systolic heart failure (HCC)  I50.22    This issue adds complexity to his management LVEF in the 10 to 15% range Suspect majority of his dyspnea issues related to this    3. Obstructive sleep apnea  G47.33    Appears to be stable on current CPAP CPAP is set at 14 cm H2O We will try to procure compliance card    4. Obesity, Class III, BMI 40-49.9 (morbid obesity) (HCC)  E66.01    This issue adds complexity to his management Query obesity with alveolar hypoventilation    5. Tobacco dependence due to cigarettes  F17.210    Patient was counseled regards discontinuation of smoking Total counseling time 3 to 5 minutes     Orders Placed This Encounter  Procedures   Pulmonary Function Test [facility] Only    Standing Status:   Future    Standing Expiration Date:   04/24/2023    Scheduling Instructions:     Next available    Order Specific Question:   Full PFT: includes the following: basic spirometry, spirometry pre & post bronchodilator, diffusion capacity (DLCO), lung volumes    Answer:   Full PFT   Meds ordered this encounter  Medications   umeclidinium-vilanterol (ANORO ELLIPTA) 62.5-25 MCG/ACT AEPB    Sig: Inhale 1 puff into the lungs daily.    Dispense:  60 each    Refill:  6   umeclidinium-vilanterol (ANORO ELLIPTA) 62.5-25 MCG/ACT AEPB    Sig: Inhale 1 puff into the lungs daily.    Dispense:  14 each    Refill:  0    Order Specific Question:   Lot Number?    Answer:   [id number]    Order Specific Question:   Expiration Date?    Answer:   05/02/2022    Order Specific Question:   Quantity    Answer:   2   Appropriate testing has been ordered for the patient.  Currently it appears  that his main issues with regards to dyspnea are more related to fatigue and likely related to his severe systolic dysfunction from ischemic cardiomyopathy.  We will see him in follow-up in 2 to 3 months time he is to contact us prior to that time should any new difficulties arise.  Renold Don, MD Advanced Bronchoscopy PCCM [facility]    *This note was dictated using voice recognition software/Dragon.  Despite best efforts to proofread, errors can occur which can change the meaning. Any transcriptional errors that result from this process are unintentional and may not be fully corrected at the time of dictation.

## 2022-04-23 NOTE — Patient Instructions (Addendum)
We are going to switch your inhaler back to Anoro 1 puff daily.  We have provided you with some samples.  We sent the prescription to your pharmacy.  You can still use your albuterol if needed up to 4 times during the day.  I encourage you to quit smoking.  Your oxygen level did well today when you walked.  We are getting some breathing tests.  We will see you in follow-up in 2 to 3 months time call sooner should any new problems arise.

## 2022-05-26 ENCOUNTER — Ambulatory Visit: Payer: Medicare Other | Admitting: Podiatry

## 2022-06-05 ENCOUNTER — Telehealth: Payer: Self-pay | Admitting: Podiatry

## 2022-06-05 ENCOUNTER — Ambulatory Visit (INDEPENDENT_AMBULATORY_CARE_PROVIDER_SITE_OTHER): Payer: Medicare Other | Admitting: Podiatry

## 2022-06-05 DIAGNOSIS — M79675 Pain in left toe(s): Secondary | ICD-10-CM | POA: Diagnosis not present

## 2022-06-05 DIAGNOSIS — M79674 Pain in right toe(s): Secondary | ICD-10-CM

## 2022-06-05 DIAGNOSIS — B351 Tinea unguium: Secondary | ICD-10-CM | POA: Diagnosis not present

## 2022-06-05 DIAGNOSIS — Z794 Long term (current) use of insulin: Secondary | ICD-10-CM

## 2022-06-05 DIAGNOSIS — E119 Type 2 diabetes mellitus without complications: Secondary | ICD-10-CM

## 2022-06-05 NOTE — Telephone Encounter (Signed)
Patient came in for appointment today with Dr. Posey Pronto. Patient states he was fitted for diabetic shoes in July and has not received them yet or a phone call. Patient was following up.

## 2022-06-05 NOTE — Progress Notes (Signed)
  Subjective:  Patient ID: Oscar [name], male    DOB: [birth year],  MRN: [id number]  Chief Complaint  Patient presents with   Nail Problem    46 y.o. male presents with the above complaint. History confirmed with patient.  His nails are thickened and elongated and causing discomfort again, says his diabetes is well controlled he is due for new A1c check soon.  Denies tingling numbness or neuropathy symptoms  Objective:  Physical Exam: warm, good capillary refill, no trophic changes or ulcerative lesions, normal DP and PT pulses, and normal sensory exam. Left Foot: dystrophic yellowed discolored nail plates with subungual debris Right Foot: dystrophic yellowed discolored nail plates with subungual debris  Assessment:   1. Pain due to onychomycosis of toenails of both feet   2. Type 2 diabetes mellitus without complication, with long-term current use of insulin ([place])       Plan:  Patient was evaluated and treated and all questions answered.  Discussed the etiology and treatment options for the condition in detail with the patient. Educated patient on the topical and oral treatment options for mycotic nails. Recommended debridement of the nails today. Sharp and mechanical debridement performed of all painful and mycotic nails today. Nails debrided in length and thickness using a nail nipper to level of comfort. Discussed treatment options including appropriate shoe gear. Follow up as needed for painful nails.    No follow-ups on file.

## 2022-06-17 ENCOUNTER — Ambulatory Visit: Payer: Medicare Other | Admitting: Family

## 2022-06-23 ENCOUNTER — Other Ambulatory Visit: Payer: Self-pay

## 2022-06-24 ENCOUNTER — Ambulatory Visit: Payer: Medicare Other | Admitting: Gastroenterology

## 2022-06-24 ENCOUNTER — Encounter: Payer: Self-pay | Admitting: Gastroenterology

## 2022-06-25 ENCOUNTER — Encounter: Payer: Self-pay | Admitting: Family

## 2022-06-25 ENCOUNTER — Encounter: Payer: Self-pay | Admitting: Pharmacist

## 2022-06-25 ENCOUNTER — Ambulatory Visit: Payer: Medicare Other | Attending: Family | Admitting: Family

## 2022-06-25 VITALS — BP 114/80 | HR 81 | Resp 20 | Ht 64.0 in | Wt 285.0 lb

## 2022-06-25 DIAGNOSIS — E1122 Type 2 diabetes mellitus with diabetic chronic kidney disease: Secondary | ICD-10-CM | POA: Insufficient documentation

## 2022-06-25 DIAGNOSIS — Z8349 Family history of other endocrine, nutritional and metabolic diseases: Secondary | ICD-10-CM | POA: Insufficient documentation

## 2022-06-25 DIAGNOSIS — I5022 Chronic systolic (congestive) heart failure: Secondary | ICD-10-CM | POA: Diagnosis present

## 2022-06-25 DIAGNOSIS — Z95810 Presence of automatic (implantable) cardiac defibrillator: Secondary | ICD-10-CM | POA: Diagnosis not present

## 2022-06-25 DIAGNOSIS — Z720 Tobacco use: Secondary | ICD-10-CM

## 2022-06-25 DIAGNOSIS — F17210 Nicotine dependence, cigarettes, uncomplicated: Secondary | ICD-10-CM | POA: Insufficient documentation

## 2022-06-25 DIAGNOSIS — K219 Gastro-esophageal reflux disease without esophagitis: Secondary | ICD-10-CM | POA: Diagnosis not present

## 2022-06-25 DIAGNOSIS — G4733 Obstructive sleep apnea (adult) (pediatric): Secondary | ICD-10-CM | POA: Diagnosis not present

## 2022-06-25 DIAGNOSIS — N183 Chronic kidney disease, stage 3 unspecified: Secondary | ICD-10-CM | POA: Diagnosis not present

## 2022-06-25 DIAGNOSIS — E785 Hyperlipidemia, unspecified: Secondary | ICD-10-CM | POA: Diagnosis not present

## 2022-06-25 DIAGNOSIS — Z8249 Family history of ischemic heart disease and other diseases of the circulatory system: Secondary | ICD-10-CM | POA: Diagnosis not present

## 2022-06-25 DIAGNOSIS — E119 Type 2 diabetes mellitus without complications: Secondary | ICD-10-CM | POA: Diagnosis not present

## 2022-06-25 DIAGNOSIS — I480 Paroxysmal atrial fibrillation: Secondary | ICD-10-CM

## 2022-06-25 DIAGNOSIS — I130 Hypertensive heart and chronic kidney disease with heart failure and stage 1 through stage 4 chronic kidney disease, or unspecified chronic kidney disease: Secondary | ICD-10-CM | POA: Diagnosis present

## 2022-06-25 DIAGNOSIS — Z794 Long term (current) use of insulin: Secondary | ICD-10-CM

## 2022-06-25 DIAGNOSIS — I10 Essential (primary) hypertension: Secondary | ICD-10-CM

## 2022-06-25 NOTE — Progress Notes (Signed)
Patient ID: Oscar [name], male   DOB: Jul 19, 1976, 45 y.o.   MRN: [id number] [place] - PHARMACIST COUNSELING NOTE  *HFrEF*  Guideline-Directed Medical Therapy/Evidence Based Medicine  ACE/ARB/ARNI: Delene Loll 49/'51mg'$  taking 1/2 tablet twice daily Beta Blocker: none due to hypotension Aldosterone Antagonist: none due to hypotension Diuretic: Torsemide 40 mg BID SGLT2i:  canagliflozin '300mg'$   changed to Jardiance '10mg'$  daily  Adherence Assessment  Do you ever forget to take your medication? '[]'$ Yes '[x]'$ No  Do you ever skip doses due to side effects? '[]'$ Yes '[x]'$ No  Do you have trouble affording your medicines? '[]'$ Yes '[x]'$ No  Are you ever unable to pick up your medication due to transportation difficulties? '[]'$ Yes '[x]'$ No  Do you ever stop taking your medications because you don't believe they are helping? '[]'$ Yes '[x]'$ No  Do you check your weight daily? '[x]'$ Yes '[]'$ No   Adherence strategy: pill dispenser  Barriers to obtaining medications: none  Vital signs: HR 81, BP 114/80, weight (pounds) 285 lb ECHO: Date 11/23/20, EF < 20%, notes moderate LVH and moderately elevated PA pressure     Latest Ref Rng & Units 12/25/2021    3:23 PM 10/28/2021    9:12 AM 10/05/2021    4:43 AM  BMP  Glucose 70 - 99 mg/dL 118  177  106   BUN 6 - 20 mg/dL 36  41  21   Creatinine 0.61 - 1.24 mg/dL 1.75  1.69  1.14   Sodium 135 - 145 mmol/L 137  134  135   Potassium 3.5 - 5.1 mmol/L 3.8  4.0  3.9   Chloride 98 - 111 mmol/L 100  95  99   CO2 22 - 32 mmol/L '28  27  27   '$ Calcium 8.9 - 10.3 mg/dL 9.0  9.7  8.9     Past Medical History:  Diagnosis Date   AICD (automatic cardioverter/defibrillator) present    Aortic atherosclerosis (HCC)    Asthma    Chronic respiratory failure (HCC)    CKD (chronic kidney disease), stage III (HCC)    COPD (chronic obstructive pulmonary disease) (HCC)    Deafness in right ear    Diabetes mellitus without complication (HCC)     Dyspnea    GERD (gastroesophageal reflux disease)    Gout    HFrEF (heart failure with reduced ejection fraction) ([place])    a.) TTE 12/10/14: EF 25%; diff inf HK; sev LV and mod LA dil; LVH. b.) TTE 06/23/18: EF 20-25%; LVH; mild LV dil, mild BAE. c.) TTE 03/01/19: EF 15%, LVH, BAE; triv PR/TR. d.) TTE 02/27/20: EF < 20%; sev LV dil; mild MR. e.) TTE 11/23/20: EF < 20%; glob HK; sev LV dil; LVH; mild-mod BAE; mod-sev TR, triv AR; G1DD. f.) TTE 05/14/21: EF <15%; LVH; sev LA and mild RV enlar; triv AR/PR, mild TR, mod MR.   Hiatal hernia    History of cardiac catheterization    a.) R/LHC 12/14/2009: normal coronaries. b.) R/LHC 12/11/2014: normal coronaries.   Hyperlipidemia    Hypertension    Hypoxemia    LBBB (left bundle branch block)    NICM (nonischemic cardiomyopathy; dilated cardiomyopathy) ([place])    a.) R/LHC 12/14/2009: normal cors; LVEDP 18 mmHg, mean PA 29 mmHg, mean PCWP 31 mmHg; CO 6 L/min; CI 2.54 L/min/m. b.) TTE 12/10/2014: EF 25%. c.) R/LHC 12/11/2014: mean RA 9 mmHg, mean PA 22 mmHg, mean PCWP 22 mmHg. d.) TTE 06/23/2018: EF 20-25%. e.) TTE 03/01/2019: EF 15%. f.) TTE  02/27/2020: < 20%. g.) TTE 11/23/2020: EF <20%. h.) TTE 05/14/2021: < 15%.   NSTEMI (non-ST elevated myocardial infarction) ([place])    a.) x 3 per patient report ---> 2012, 2014, 2016   Obesity    On supplemental oxygen by nasal cannula    a.) 2-3 L/[place] PRN   OSA on CPAP    PAF (paroxysmal atrial fibrillation) ([place])    a.) CHA2DS2-VASc = 4 (HFrEF, HTN, prior MI, T2DM). b.) rate/rhythm maintained without pharmacologial intervention; no current anticoagulation.   Pancreatitis    PSVT (paroxysmal supraventricular tachycardia)     ASSESSMENT 46 year old male who presents to the HF clinic for follow up. PMH significant for atrial fibrillation, h/o paroxysmal SVT, nonischemic cardiomyopathy with LVEF <20%, s/p dual AICD placement (2016), acute on chronic systolic congestive heart failure, previous NSTEMI's (2014 & 2016)  (normal coronary anatomy per LHC in 2016), LBBB, hypertension, hyperlipidemia, OSA, COPD, DM type II, tobacco use, obesity and GERD.   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 I & D and debridement completed. Placed on antibiotics. BP improved since last OV and his Invokana was change to [place] today by PCP.  PLAN  -Continue medication as previously prescribed - Will add spironolactone 12.'5mg'$  daily during next OV if BM and BP remains stable.  Time spent: 15 minutes  Oscar [name] PharmD, BCPS 06/25/2022 3:17 PM   Current Outpatient Medications:    acetaminophen (TYLENOL) 500 MG tablet, Take 1,000 mg by mouth every 6 (six) hours as needed for moderate pain or mild pain., Disp: , Rfl:    allopurinol (ZYLOPRIM) 300 MG tablet, Take 300 mg by mouth daily., Disp: , Rfl:    atorvastatin (LIPITOR) 40 MG tablet, Take 40 mg by mouth daily., Disp: , Rfl:    clindamycin (CLEOCIN-T) 1 % lotion, Apply to affected areas daily after shower., Disp: 60 mL, Rfl: 3   colchicine 0.6 MG tablet, Take 0.6 mg by mouth daily., Disp: , Rfl:    JARDIANCE 10 MG TABS tablet, Take 10 mg by mouth daily., Disp: , Rfl:    ketoconazole (NIZORAL) 2 % cream, Apply twice daily to neck until clear, Disp: 60 g, Rfl: 2   NON FORMULARY, Pt uses a cpap nightly, Disp: , Rfl:    Omeprazole 20 MG TBDD, Take 20 mg by mouth daily., Disp: , Rfl:    sacubitril-valsartan (ENTRESTO) 49-51 MG, Take 0.5 tablets by mouth 2 (two) times daily., Disp: , Rfl:    tazarotene (AVAGE) 0.1 % cream, Apply topically at bedtime., Disp: 60 g, Rfl: 3   torsemide 40 MG TABS, Take 40 mg by mouth 2 (two) times daily., Disp: 120 tablet, Rfl: 1   umeclidinium-vilanterol (ANORO ELLIPTA) 62.5-25 MCG/ACT AEPB, Inhale 1 puff into the lungs daily., Disp: 60 each, Rfl: 6   VENTOLIN HFA 108 (90 Base) MCG/ACT inhaler, Inhale 2 puffs into the lungs every 6 (six) hours as needed for shortness of breath., Disp: , Rfl:    MEDICATION ADHERENCES TIPS AND  STRATEGIES Taking medication as prescribed improves patient outcomes in heart failure (reduces hospitalizations, improves symptoms, increases survival) Side effects of medications can be managed by decreasing doses, switching agents, stopping drugs, or adding additional therapy. Please let someone in the [facility] know if you have having bothersome side effects so we can modify your regimen. Do not alter your medication regimen without talking to [place].  Medication reminders can help patients remember to take drugs on time. If you are missing or forgetting doses you  can try linking behaviors, using pill boxes, or an electronic reminder like an alarm on your phone or an app. Some people can also get automated phone calls as medication reminders.

## 2022-06-25 NOTE — Patient Instructions (Signed)
Continue weighing daily and call for an overnight weight gain of 3 pounds or more or a weekly weight gain of more than 5 pounds.   If you have voicemail, please make sure your mailbox is cleaned out so that we may leave a message and please make sure to listen to any voicemails.     

## 2022-06-25 NOTE — Progress Notes (Signed)
Patient ID: Oscar [name], male    DOB: 12-23-1975, 46 y.o.   MRN: [id number]  Oscar [name] is a 46 y/o male with a history of obstructive sleep apnea (currently with CPAP), MI, HTN, hyperlipidemia, GERD, DM, SVT, asthma and chronic heart failure.  Echo report from 11/23/20 reviewed and showed an EF of <20% along with moderate LVH and moderately elevated PA pressure. Echo done 02/27/20 reviewed and showed an EF of <20%. Echo done 03/01/2019 which showed an EF of 15% along with mild Oscar. Echo done 06/23/18 reviewed and showed an EF of 20-25% along with mild Oscar. Echo done 02/06/15 which showed an EF of 25% which is unchanged from previous echo on 12/10/14 which showed an EF of 25% and mild Oscar.   Admitted 01/02/22 for outpatient hernia/ cholecystectomy.   He presents today for a follow-up visit with a chief complaint of minimal fatigue upon moderate exertion. Describes this as chronic in nature although much improved since he was last here. He has associated cough, shortness of breath, wheezing, palpitations, chronic back pain and gradual weight gain along with this. He denies any abdominal distention, diarrhea, nausea, pedal edema, chest pain or dizziness.   Recently was changed from invokana to jardiance and will be picking up the jardiance later today. Has been taking his torsemide just once daily.   Says that he's felt much better since his hernia repair/ cholecystectomy.  Past Medical History:  Diagnosis Date   AICD (automatic cardioverter/defibrillator) present    Aortic atherosclerosis (HCC)    Asthma    Chronic respiratory failure (HCC)    CKD (chronic kidney disease), stage III (HCC)    COPD (chronic obstructive pulmonary disease) (HCC)    Deafness in right ear    Diabetes mellitus without complication (HCC)    Dyspnea    GERD (gastroesophageal reflux disease)    Gout    HFrEF (heart failure with reduced ejection fraction) ([place])    a.) TTE 12/10/14: EF 25%; diff inf HK; sev LV and mod LA  dil; LVH. b.) TTE 06/23/18: EF 20-25%; LVH; mild LV dil, mild BAE. c.) TTE 03/01/19: EF 15%, LVH, BAE; triv PR/TR. d.) TTE 02/27/20: EF < 20%; sev LV dil; mild Oscar. e.) TTE 11/23/20: EF < 20%; glob HK; sev LV dil; LVH; mild-mod BAE; mod-sev TR, triv AR; G1DD. f.) TTE 05/14/21: EF <15%; LVH; sev LA and mild RV enlar; triv AR/PR, mild TR, mod Oscar.   Hiatal hernia    History of cardiac catheterization    a.) R/LHC 12/14/2009: normal coronaries. b.) R/LHC 12/11/2014: normal coronaries.   Hyperlipidemia    Hypertension    Hypoxemia    LBBB (left bundle branch block)    NICM (nonischemic cardiomyopathy; dilated cardiomyopathy) ([place])    a.) R/LHC 12/14/2009: normal cors; LVEDP 18 mmHg, mean PA 29 mmHg, mean PCWP 31 mmHg; CO 6 L/min; CI 2.54 L/min/m. b.) TTE 12/10/2014: EF 25%. c.) R/LHC 12/11/2014: mean RA 9 mmHg, mean PA 22 mmHg, mean PCWP 22 mmHg. d.) TTE 06/23/2018: EF 20-25%. e.) TTE 03/01/2019: EF 15%. f.) TTE 02/27/2020: < 20%. g.) TTE 11/23/2020: EF <20%. h.) TTE 05/14/2021: < 15%.   NSTEMI (non-ST elevated myocardial infarction) ([place])    a.) x 3 per patient report ---> 2012, 2014, 2016   Obesity    On supplemental oxygen by nasal cannula    a.) 2-3 L/[place] PRN   OSA on CPAP    PAF (paroxysmal atrial fibrillation) ([place])    a.) CHA2DS2-VASc =  4 (HFrEF, HTN, prior MI, T2DM). b.) rate/rhythm maintained without pharmacologial intervention; no current anticoagulation.   Pancreatitis    PSVT (paroxysmal supraventricular tachycardia)    Past Surgical History:  Procedure Laterality Date   BIOPSY  11/25/2021   Procedure: BIOPSY;  Surgeon: Rush Landmark Telford Nab., MD;  Location: [facility];  Service: Gastroenterology;;   CARDIAC CATHETERIZATION  12/11/2014   Procedure: RIGHT/LEFT HEART CATH AND CORONARY ANGIOGRAPHY;  Surgeon: Lorretta Harp, MD;  Location: [facility] CATH LAB;  Service: Cardiovascular;;   COLONOSCOPY WITH PROPOFOL N/A 11/13/2021   Procedure: COLONOSCOPY WITH PROPOFOL;  Surgeon: Lin Landsman, MD;  Location: [facility] ENDOSCOPY;  Service: Gastroenterology;  Laterality: N/A;   ESOPHAGOGASTRODUODENOSCOPY (EGD) WITH PROPOFOL N/A 11/25/2021   Procedure: ESOPHAGOGASTRODUODENOSCOPY (EGD) WITH PROPOFOL;  Surgeon: Rush Landmark Telford Nab., MD;  Location: [facility] ENDOSCOPY;  Service: Gastroenterology;  Laterality: N/A;   ICD LEAD REMOVAL N/A 03/30/2015   Procedure: ICD LEAD REMOVAL;  Surgeon: Marzetta Board, MD;  Location: [facility] ORS;  Service: Cardiovascular;  Laterality: N/A;   IMPLANTABLE CARDIOVERTER DEFIBRILLATOR IMPLANT     INCISION AND DRAINAGE ABSCESS N/A 12/12/2020   Procedure: INCISION AND DRAINAGE ABSCESS;  Surgeon: Jules Husbands, MD;  Location: [facility] ORS;  Service: General;  Laterality: N/A;   INSERT / REPLACE / [place] N/A 12/09/2014   Procedure: LEFT HEART CATHETERIZATION WITH CORONARY ANGIOGRAM;  Surgeon: Burnell Blanks, MD;  Location: [facility] CATH LAB;  Service: Cardiovascular;  Laterality: N/A;   RIGHT/LEFT HEART CATH AND CORONARY ANGIOGRAPHY Bilateral 12/14/2009   Procedure: RIGHT/LEFT HEART CATH AND CORONARY ANGIOGRAPHY; Location: [place]; Surgeon: Katrine Coho, MD   TONSILLECTOMY     as a child , adnoids removed   UPPER ESOPHAGEAL ENDOSCOPIC ULTRASOUND (EUS) N/A 11/25/2021   Procedure: UPPER ESOPHAGEAL ENDOSCOPIC ULTRASOUND (EUS);  Surgeon: Irving Copas., MD;  Location: [facility];  Service: Gastroenterology;  Laterality: N/A;   Family History  Problem Relation Age of Onset   Hypertension Mother    Congestive Heart Failure Mother    Hypertension Sister    Diabetes Sister    Pancreatitis Sister    COPD Sister    Emphysema Sister        smoked   Pancreatitis Brother    Anemia Neg Hx    Arrhythmia Neg Hx    Asthma Neg Hx    Clotting disorder Neg Hx    Fainting Neg Hx    Heart attack Neg Hx    Heart disease Neg Hx    Heart failure Neg Hx    Hyperlipidemia Neg Hx    Social History    Tobacco Use   Smoking status: Every Day    Packs/day: 1.50    Years: 34.00    Total pack years: 51.00    Types: Cigarettes    Passive exposure: Past   Smokeless tobacco: Never   Tobacco comments:    1/2 ppd [place], [place] 04/23/22   Substance Use Topics   Alcohol use: No   Allergies  Allergen Reactions   Ciprofloxacin Swelling and Other (See Comments)    Migraine Headache    Iodinated Contrast Media     Other reaction(s): Vomiting Projectile vomiting   Isosorb Dinitrate-Hydralazine Other (See Comments)    Migraine Headache   Prior to Admission medications   Medication Sig Start Date End Date Taking? Authorizing Provider  acetaminophen (TYLENOL) 500 MG tablet Take 1,000 mg by mouth every 6 (six) hours as needed  for moderate pain or mild pain.   Yes [provider]  allopurinol (ZYLOPRIM) 300 MG tablet Take 300 mg by mouth daily.   Yes [provider]  atorvastatin (LIPITOR) 40 MG tablet Take 40 mg by mouth daily.   Yes [provider]  clindamycin (CLEOCIN-T) 1 % lotion Apply to affected areas daily after shower. 06/11/21  Yes [name], MD  colchicine 0.6 MG tablet Take 0.6 mg by mouth daily. 11/04/21  Yes [provider]  JARDIANCE 10 MG TABS tablet Take 10 mg by mouth daily. 06/25/22  Yes [provider]  ketoconazole (NIZORAL) 2 % cream Apply twice daily to neck until clear 06/11/21  Yes [name], MD  NON FORMULARY Pt uses a cpap nightly   Yes [provider]  Omeprazole 20 MG TBDD Take 20 mg by mouth daily.   Yes [provider]  sacubitril-valsartan (ENTRESTO) 49-51 MG Take 0.5 tablets by mouth 2 (two) times daily.   Yes [provider]  tazarotene (AVAGE) 0.1 % cream Apply topically at bedtime. 06/11/21  Yes [name], MD  torsemide 40 MG TABS Take 40 mg by mouth 2 (two) times daily. 11/27/20  Yes Lorella Nimrod, MD  umeclidinium-vilanterol (ANORO ELLIPTA) 62.5-25 MCG/ACT AEPB Inhale 1  puff into the lungs daily. 04/23/22  Yes Tyler Pita, MD  VENTOLIN HFA 108 [phone] Base) MCG/ACT inhaler Inhale 2 puffs into the lungs every 6 (six) hours as needed for shortness of breath. 11/23/20  Yes [provider]   Review of Systems  Constitutional:  Positive for fatigue. Negative for appetite change.  HENT:  Negative for congestion, postnasal drip and sore throat.   Eyes: Negative.   Respiratory:  Positive for cough, shortness of breath and wheezing (at times). Negative for chest tightness.   Cardiovascular:  Positive for palpitations (at times). Negative for chest pain and leg swelling.  Gastrointestinal:  Negative for abdominal distention, abdominal pain, diarrhea and nausea.  Endocrine: Negative.   Genitourinary: Negative.   Musculoskeletal:  Positive for back pain. Negative for arthralgias.  Skin: Negative.   Allergic/Immunologic: Negative.   Neurological:  Negative for dizziness, light-headedness and headaches.  Hematological:  Negative for adenopathy. Does not bruise/bleed easily.  Psychiatric/Behavioral:  Negative for dysphoric mood and sleep disturbance (sleeping on 3 pillows with CPAP/ oxygen). The patient is not nervous/anxious.    Vitals:   06/25/22 1041  BP: 114/80  Pulse: 81  Resp: 20  SpO2: 98%  Weight: 285 lb (129.3 kg)  Height: '5\' 4"'$  (1.626 m)   Wt Readings from Last 3 Encounters:  06/25/22 285 lb (129.3 kg)  04/23/22 280 lb (127 kg)  01/29/22 269 lb (122 kg)   Lab Results  Component Value Date   CREATININE 1.75 (H) 12/25/2021   CREATININE 1.69 (H) 10/28/2021   CREATININE 1.14 10/05/2021   Physical Exam Vitals and nursing note reviewed.  Constitutional:      Appearance: He is well-developed.  HENT:     Head: Normocephalic and atraumatic.  Neck:     Vascular: No JVD.  Cardiovascular:     Rate and Rhythm: Normal rate and regular rhythm.  Pulmonary:     Effort: Pulmonary effort is normal.     Breath sounds: No wheezing or rales.   Abdominal:     General: There is no distension.     Palpations: Abdomen is soft.     Tenderness: There is no abdominal tenderness.  Musculoskeletal:        General:  No tenderness.     Cervical back: Normal range of motion and neck supple.     Right lower leg: No edema.     Left lower leg: No edema.  Skin:    General: Skin is warm and dry.  Neurological:     Mental Status: He is alert and oriented to person, place, and time.  Psychiatric:        Behavior: Behavior normal.        Thought Content: Thought content normal.     Assessment & Plan:  1: Chronic heart failure with reduced ejection fraction- - NYHA class II - euvolemic today - weighing daily; Reminded to call for an overnight weight gain of >2 pounds or a weekly weight gain of >5 pounds.  - weight up 9 pounds from last visit here 6 months ago - AICD in place - on GDMT of entreso; will be starting jardiance later today (replacing invokana) - discussed starting spironolactone 12.'5mg'$  at next visit - will check BMP next visit since starting jardiance unless it's checked elsewhere - BNP 12/26/20 was 136.9 - PharmD reconciled medications with the patient - he does not take the flu vaccine  2: HTN with CKD- - BP looks good (114/80) - sees PCP [name]) at [place] from 04/28/22 reviewed and showed sodium 139, potassium 4.1, Cr 1.6 and GFR 53 - saw nephrology Lanora Manis) 05/01/22  3: Obstructive sleep apnea- - wears CPAP HS, 8 hours/night - continues wearing oxygen at 3L at bedtime & PRN during the day - saw pulmonology Patsey Berthold) 04/23/22  4: Diabetes- - A1c from 04/28/22 was 6.9%  5: Previous tobacco use- - smokes 1/2 ppd of cigarettes - complete cessation discussed for 3 minutes  6: Atrial fibrillation- - RRR in office today - saw cardiology Dema Severin) 01/30/22    Patient did not bring his medications nor a list. Each medication was verbally reviewed with the patient and he was  encouraged to bring the bottles to every visit to confirm accuracy of list.   Return in 1 month, sooner if needed

## 2022-07-09 ENCOUNTER — Encounter: Payer: Medicare Other | Admitting: Dermatology

## 2022-07-11 NOTE — Telephone Encounter (Signed)
Spoke with pt and informed him that we have not received the CMN back yet. Will print and refax.

## 2022-07-14 ENCOUNTER — Telehealth: Payer: Self-pay | Admitting: Pulmonary Disease

## 2022-07-14 NOTE — Telephone Encounter (Signed)
Lm x1 for Dean with [employer].  I do not see where a vent was ordered by our office.  Dr. Patsey Berthold, please advise. Thanks

## 2022-07-14 NOTE — Telephone Encounter (Signed)
I cannot change the note because the note reflects what the patient saw me for which was management of COPD.  He came with a diagnosis of obstructive sleep apnea that was given elsewhere.  I requested was a compliance download as I did not know what the patient is compliant was.  The patient is the one who provided the settings that he was on.  If [employer] has other information they need to forward that to our office.  I can only go by what the patient told me.  If the patient is on nocturnal ventilator it would be ideal to find out who ordered it initially.  It was not from our office as we have only seen the patient 1 time for COPD management.  If his sleep apnea is that complex, we will need to have him see one of our sleep providers.

## 2022-07-15 NOTE — Telephone Encounter (Signed)
Oscar [name] is aware of below message and voiced her understanding.  Oscar [name] stated that Dr. Patsey Berthold signed a vent CMN on 06/26/2022. She stated that hospital ordered vent initially. She can not use hospital notes, as it is out of the 90 day window.   Lm for patient to schedule OV.

## 2022-07-16 NOTE — Telephone Encounter (Signed)
Lm x2 for patient.  Will close encounter per office protocol.   

## 2022-07-23 ENCOUNTER — Ambulatory Visit: Payer: Medicare Other | Admitting: Family

## 2022-07-23 ENCOUNTER — Telehealth: Payer: Self-pay | Admitting: Family

## 2022-07-23 NOTE — Telephone Encounter (Signed)
Patient did not show for his [facility] Failure [facility] appointment on 07/23/22. Will attempt to reschedule.   Of note, this is the 9th appointment that he has missed.

## 2022-07-23 NOTE — Progress Notes (Deleted)
Patient ID: Oscar [name], male    DOB: 11-11-1975, 46 y.o.   MRN: [id number]  Oscar [name] is a 46 y/o male with a history of obstructive sleep apnea (currently with CPAP), MI, HTN, hyperlipidemia, GERD, DM, SVT, asthma and chronic heart failure.  Echo report 05/14/21 showed an EF of <15% along with severe LAE and moderate LVH. Echo report from 11/23/20 reviewed and showed an EF of <20% along with moderate LVH and moderately elevated PA pressure. Echo done 02/27/20 reviewed and showed an EF of <20%. Echo done 03/01/2019 which showed an EF of 15% along with mild Oscar. Echo done 06/23/18 reviewed and showed an EF of 20-25% along with mild Oscar. Echo done 02/06/15 which showed an EF of 25% which is unchanged from previous echo on 12/10/14 which showed an EF of 25% and mild Oscar.   Has not been admitted or been in the ED in the last 6 months.   He presents today for a follow-up visit with a chief complaint of   Past Medical History:  Diagnosis Date   AICD (automatic cardioverter/defibrillator) present    Aortic atherosclerosis (HCC)    Asthma    Chronic respiratory failure (HCC)    CKD (chronic kidney disease), stage III (HCC)    COPD (chronic obstructive pulmonary disease) (HCC)    Deafness in right ear    Diabetes mellitus without complication (HCC)    Dyspnea    GERD (gastroesophageal reflux disease)    Gout    HFrEF (heart failure with reduced ejection fraction) ([place])    a.) TTE 12/10/14: EF 25%; diff inf HK; sev LV and mod LA dil; LVH. b.) TTE 06/23/18: EF 20-25%; LVH; mild LV dil, mild BAE. c.) TTE 03/01/19: EF 15%, LVH, BAE; triv PR/TR. d.) TTE 02/27/20: EF < 20%; sev LV dil; mild Oscar. e.) TTE 11/23/20: EF < 20%; glob HK; sev LV dil; LVH; mild-mod BAE; mod-sev TR, triv AR; G1DD. f.) TTE 05/14/21: EF <15%; LVH; sev LA and mild RV enlar; triv AR/PR, mild TR, mod Oscar.   Hiatal hernia    History of cardiac catheterization    a.) R/LHC 12/14/2009: normal coronaries. b.) R/LHC 12/11/2014: normal coronaries.    Hyperlipidemia    Hypertension    Hypoxemia    LBBB (left bundle branch block)    NICM (nonischemic cardiomyopathy; dilated cardiomyopathy) ([place])    a.) R/LHC 12/14/2009: normal cors; LVEDP 18 mmHg, mean PA 29 mmHg, mean PCWP 31 mmHg; CO 6 L/min; CI 2.54 L/min/m. b.) TTE 12/10/2014: EF 25%. c.) R/LHC 12/11/2014: mean RA 9 mmHg, mean PA 22 mmHg, mean PCWP 22 mmHg. d.) TTE 06/23/2018: EF 20-25%. e.) TTE 03/01/2019: EF 15%. f.) TTE 02/27/2020: < 20%. g.) TTE 11/23/2020: EF <20%. h.) TTE 05/14/2021: < 15%.   NSTEMI (non-ST elevated myocardial infarction) ([place])    a.) x 3 per patient report ---> 2012, 2014, 2016   Obesity    On supplemental oxygen by nasal cannula    a.) 2-3 L/[place] PRN   OSA on CPAP    PAF (paroxysmal atrial fibrillation) ([place])    a.) CHA2DS2-VASc = 4 (HFrEF, HTN, prior MI, T2DM). b.) rate/rhythm maintained without pharmacologial intervention; no current anticoagulation.   Pancreatitis    PSVT (paroxysmal supraventricular tachycardia)    Past Surgical History:  Procedure Laterality Date   BIOPSY  11/25/2021   Procedure: BIOPSY;  Surgeon: Rush Landmark Telford Nab., MD;  Location: [facility] ENDOSCOPY;  Service: Gastroenterology;;   CARDIAC CATHETERIZATION  12/11/2014  Procedure: RIGHT/LEFT HEART CATH AND CORONARY ANGIOGRAPHY;  Surgeon: Lorretta Harp, MD;  Location: [facility] CATH LAB;  Service: Cardiovascular;;   COLONOSCOPY WITH PROPOFOL N/A 11/13/2021   Procedure: COLONOSCOPY WITH PROPOFOL;  Surgeon: Lin Landsman, MD;  Location: [facility] ENDOSCOPY;  Service: Gastroenterology;  Laterality: N/A;   ESOPHAGOGASTRODUODENOSCOPY (EGD) WITH PROPOFOL N/A 11/25/2021   Procedure: ESOPHAGOGASTRODUODENOSCOPY (EGD) WITH PROPOFOL;  Surgeon: Rush Landmark Telford Nab., MD;  Location: [facility] ENDOSCOPY;  Service: Gastroenterology;  Laterality: N/A;   ICD LEAD REMOVAL N/A 03/30/2015   Procedure: ICD LEAD REMOVAL;  Surgeon: Marzetta Board, MD;  Location: [facility] ORS;  Service: Cardiovascular;  Laterality: N/A;    IMPLANTABLE CARDIOVERTER DEFIBRILLATOR IMPLANT     INCISION AND DRAINAGE ABSCESS N/A 12/12/2020   Procedure: INCISION AND DRAINAGE ABSCESS;  Surgeon: Jules Husbands, MD;  Location: [facility] ORS;  Service: General;  Laterality: N/A;   INSERT / REPLACE / [place] N/A 12/09/2014   Procedure: LEFT HEART CATHETERIZATION WITH CORONARY ANGIOGRAM;  Surgeon: Burnell Blanks, MD;  Location: [facility] CATH LAB;  Service: Cardiovascular;  Laterality: N/A;   RIGHT/LEFT HEART CATH AND CORONARY ANGIOGRAPHY Bilateral 12/14/2009   Procedure: RIGHT/LEFT HEART CATH AND CORONARY ANGIOGRAPHY; Location: [place]; Surgeon: Katrine Coho, MD   TONSILLECTOMY     as a child , adnoids removed   UPPER ESOPHAGEAL ENDOSCOPIC ULTRASOUND (EUS) N/A 11/25/2021   Procedure: UPPER ESOPHAGEAL ENDOSCOPIC ULTRASOUND (EUS);  Surgeon: Irving Copas., MD;  Location: [facility];  Service: Gastroenterology;  Laterality: N/A;   Family History  Problem Relation Age of Onset   Hypertension Mother    Congestive Heart Failure Mother    Hypertension Sister    Diabetes Sister    Pancreatitis Sister    COPD Sister    Emphysema Sister        smoked   Pancreatitis Brother    Anemia Neg Hx    Arrhythmia Neg Hx    Asthma Neg Hx    Clotting disorder Neg Hx    Fainting Neg Hx    Heart attack Neg Hx    Heart disease Neg Hx    Heart failure Neg Hx    Hyperlipidemia Neg Hx    Social History   Tobacco Use   Smoking status: Every Day    Packs/day: 1.50    Years: 34.00    Total pack years: 51.00    Types: Cigarettes    Passive exposure: Past   Smokeless tobacco: Never   Tobacco comments:    1/2 ppd [place], [place] 04/23/22   Substance Use Topics   Alcohol use: No   Allergies  Allergen Reactions   Ciprofloxacin Swelling and Other (See Comments)    Migraine Headache    Iodinated Contrast Media     Other reaction(s): Vomiting Projectile vomiting   Isosorb  Dinitrate-Hydralazine Other (See Comments)    Migraine Headache    Review of Systems  Constitutional:  Positive for fatigue. Negative for appetite change.  HENT:  Negative for congestion, postnasal drip and sore throat.   Eyes: Negative.   Respiratory:  Positive for cough, shortness of breath and wheezing (at times). Negative for chest tightness.   Cardiovascular:  Positive for palpitations (at times). Negative for chest pain and leg swelling.  Gastrointestinal:  Negative for abdominal distention, abdominal pain, diarrhea and nausea.  Endocrine: Negative.   Genitourinary: Negative.   Musculoskeletal:  Positive for back pain. Negative for arthralgias.  Skin: Negative.  Allergic/Immunologic: Negative.   Neurological:  Negative for dizziness, light-headedness and headaches.  Hematological:  Negative for adenopathy. Does not bruise/bleed easily.  Psychiatric/Behavioral:  Negative for dysphoric mood and sleep disturbance (sleeping on 3 pillows with CPAP/ oxygen). The patient is not nervous/anxious.      Physical Exam Vitals and nursing note reviewed.  Constitutional:      Appearance: He is well-developed.  HENT:     Head: Normocephalic and atraumatic.  Neck:     Vascular: No JVD.  Cardiovascular:     Rate and Rhythm: Normal rate and regular rhythm.  Pulmonary:     Effort: Pulmonary effort is normal.     Breath sounds: No wheezing or rales.  Abdominal:     General: There is no distension.     Palpations: Abdomen is soft.     Tenderness: There is no abdominal tenderness.  Musculoskeletal:        General: No tenderness.     Cervical back: Normal range of motion and neck supple.     Right lower leg: No edema.     Left lower leg: No edema.  Skin:    General: Skin is warm and dry.  Neurological:     Mental Status: He is alert and oriented to person, place, and time.  Psychiatric:        Behavior: Behavior normal.        Thought Content: Thought content normal.      Assessment & Plan:  1: Chronic heart failure with reduced ejection fraction- - NYHA class II - euvolemic today - weighing daily; Reminded to call for an overnight weight gain of >2 pounds or a weekly weight gain of >5 pounds.  - weight 285 pounds from last visit here 1 month ago - AICD in place - on GDMT of entreso; will be starting jardiance later today (replacing invokana) - discussed starting spironolactone 12.'5mg'$  at next visit - will check BMP today - BNP 12/26/20 was 136.9 - he does not take the flu vaccine  2: HTN with CKD- - BP  - sees PCP Oscar [name]) at [place] from 04/28/22 reviewed and showed sodium 139, potassium 4.1, Cr 1.6 and GFR 53 - saw nephrology Oscar [name]) 05/01/22  3: Obstructive sleep apnea- - wears CPAP HS, 8 hours/night - continues wearing oxygen at 3L at bedtime & PRN during the day - saw pulmonology Oscar [name]) 04/23/22; returns 08/13/22  4: Diabetes- - A1c from 04/28/22 was 6.9%  5: Previous tobacco use- - smokes 1/2 ppd of cigarettes - complete cessation discussed for 3 minutes  6: Atrial fibrillation- - RRR in office today - saw cardiology Oscar [name]) 01/30/22    Patient did not bring his medications nor a list. Each medication was verbally reviewed with the patient and he was encouraged to bring the bottles to every visit to confirm accuracy of list.

## 2022-08-13 ENCOUNTER — Ambulatory Visit: Payer: Medicare Other | Admitting: Pulmonary Disease

## 2022-09-04 ENCOUNTER — Telehealth: Payer: Self-pay | Admitting: Podiatry

## 2022-09-04 NOTE — Telephone Encounter (Signed)
Left message on vm patient needs to come in to be measured for diabetic shoes

## 2022-09-08 ENCOUNTER — Telehealth: Payer: Self-pay

## 2022-09-08 NOTE — Telephone Encounter (Signed)
Next available is fine  RV

## 2022-09-08 NOTE — Telephone Encounter (Signed)
Made appointment 12/22/2022

## 2022-09-08 NOTE — Telephone Encounter (Signed)
Patient is calling to make a appointment. He states he has chronic pancreatitis. He had his gallbladder removed and navel fixed. He states he wants to make a follow up appointment ASAP. Do you want me to over book patient or do you want to do next available

## 2022-09-10 ENCOUNTER — Ambulatory Visit: Payer: Medicare Other | Admitting: *Deleted

## 2022-09-10 DIAGNOSIS — M2142 Flat foot [pes planus] (acquired), left foot: Secondary | ICD-10-CM

## 2022-09-10 DIAGNOSIS — Z794 Long term (current) use of insulin: Secondary | ICD-10-CM

## 2022-09-10 NOTE — Progress Notes (Signed)
Patient presents to the office today for diabetic shoe and insole measuring.  Patient was measured with brannock device to determine size and width for 1 pair of extra depth shoes and foam casted for 3 pair of insoles.   Documentation of medical necessity will be sent to patient's treating diabetic doctor to verify and sign.   Patient's diabetic provider: Dr. Elyse Jarvis Revelo  Shoes and insoles will be ordered at that time and patient will be notified for an appointment for fitting when they arrive.   Shoe size (per patient): 14   Brannock measurement: RIGHT/LEFT: 13 E  Patient shoe selection-   Shoe choice:   Apex [id number]  Shoe size ordered: Men's 14 X-Wide  *Patient requested larger size due to swelling in feet

## 2022-09-22 NOTE — Progress Notes (Signed)
Patient ID: Oscar [name], male    DOB: Apr 15, 1976, 47 y.o.   MRN: [id number]  Oscar [name] is a 47 y/o male with a history of obstructive sleep apnea (currently with CPAP), MI, HTN, hyperlipidemia, GERD, DM, SVT, asthma and chronic heart failure.  Echo report from 11/23/20 reviewed and showed an EF of <20% along with moderate LVH and moderately elevated PA pressure. Echo done 02/27/20 reviewed and showed an EF of <20%. Echo done 03/01/2019 which showed an EF of 15% along with mild Oscar. Echo done 06/23/18 reviewed and showed an EF of 20-25% along with mild Oscar. Echo done 02/06/15 which showed an EF of 25% which is unchanged from previous echo on 12/10/14 which showed an EF of 25% and mild Oscar.   Has not been admitted or been in the ED in the last 6 months.   He presents today for a follow-up visit with a chief complaint of moderate fatigue with minimal exertion. Describes this as chronic in nature although does feel like it's worsening over the last several weeks. Has associated cough, SOB, wheezing, palpitations, dizziness and weight gain along with this. Denies any difficulty sleeping, abdominal distention, pedal edema or chest pain.   Currently wearing nocturnal ventilator and not CPAP. He says that he sleeps much better since he's been wearing this.   Past Medical History:  Diagnosis Date   AICD (automatic cardioverter/defibrillator) present    Aortic atherosclerosis (HCC)    Asthma    Chronic respiratory failure (HCC)    CKD (chronic kidney disease), stage III (HCC)    COPD (chronic obstructive pulmonary disease) (HCC)    Deafness in right ear    Diabetes mellitus without complication (HCC)    Dyspnea    GERD (gastroesophageal reflux disease)    Gout    HFrEF (heart failure with reduced ejection fraction) ([place])    a.) TTE 12/10/14: EF 25%; diff inf HK; sev LV and mod LA dil; LVH. b.) TTE 06/23/18: EF 20-25%; LVH; mild LV dil, mild BAE. c.) TTE 03/01/19: EF 15%, LVH, BAE; triv PR/TR. d.) TTE  02/27/20: EF < 20%; sev LV dil; mild Oscar. e.) TTE 11/23/20: EF < 20%; glob HK; sev LV dil; LVH; mild-mod BAE; mod-sev TR, triv AR; G1DD. f.) TTE 05/14/21: EF <15%; LVH; sev LA and mild RV enlar; triv AR/PR, mild TR, mod Oscar.   Hiatal hernia    History of cardiac catheterization    a.) R/LHC 12/14/2009: normal coronaries. b.) R/LHC 12/11/2014: normal coronaries.   Hyperlipidemia    Hypertension    Hypoxemia    LBBB (left bundle branch block)    NICM (nonischemic cardiomyopathy; dilated cardiomyopathy) ([place])    a.) R/LHC 12/14/2009: normal cors; LVEDP 18 mmHg, mean PA 29 mmHg, mean PCWP 31 mmHg; CO 6 L/min; CI 2.54 L/min/m. b.) TTE 12/10/2014: EF 25%. c.) R/LHC 12/11/2014: mean RA 9 mmHg, mean PA 22 mmHg, mean PCWP 22 mmHg. d.) TTE 06/23/2018: EF 20-25%. e.) TTE 03/01/2019: EF 15%. f.) TTE 02/27/2020: < 20%. g.) TTE 11/23/2020: EF <20%. h.) TTE 05/14/2021: < 15%.   NSTEMI (non-ST elevated myocardial infarction) ([place])    a.) x 3 per patient report ---> 2012, 2014, 2016   Obesity    On supplemental oxygen by nasal cannula    a.) 2-3 L/[place] PRN   OSA on CPAP    PAF (paroxysmal atrial fibrillation) ([place])    a.) CHA2DS2-VASc = 4 (HFrEF, HTN, prior MI, T2DM). b.) rate/rhythm maintained without pharmacologial intervention; no  current anticoagulation.   Pancreatitis    PSVT (paroxysmal supraventricular tachycardia)    Past Surgical History:  Procedure Laterality Date   BIOPSY  11/25/2021   Procedure: BIOPSY;  Surgeon: Oscar Landmark Telford Nab., MD;  Location: [facility];  Service: Gastroenterology;;   CARDIAC CATHETERIZATION  12/11/2014   Procedure: RIGHT/LEFT HEART CATH AND CORONARY ANGIOGRAPHY;  Surgeon: Oscar Harp, MD;  Location: [facility] CATH LAB;  Service: Cardiovascular;;   COLONOSCOPY WITH PROPOFOL N/A 11/13/2021   Procedure: COLONOSCOPY WITH PROPOFOL;  Surgeon: Oscar Landsman, MD;  Location: [facility] ENDOSCOPY;  Service: Gastroenterology;  Laterality: N/A;   ESOPHAGOGASTRODUODENOSCOPY (EGD)  WITH PROPOFOL N/A 11/25/2021   Procedure: ESOPHAGOGASTRODUODENOSCOPY (EGD) WITH PROPOFOL;  Surgeon: Oscar Landmark Telford Nab., MD;  Location: [facility] ENDOSCOPY;  Service: Gastroenterology;  Laterality: N/A;   ICD LEAD REMOVAL N/A 03/30/2015   Procedure: ICD LEAD REMOVAL;  Surgeon: Oscar Board, MD;  Location: [facility] ORS;  Service: Cardiovascular;  Laterality: N/A;   IMPLANTABLE CARDIOVERTER DEFIBRILLATOR IMPLANT     INCISION AND DRAINAGE ABSCESS N/A 12/12/2020   Procedure: INCISION AND DRAINAGE ABSCESS;  Surgeon: Oscar Husbands, MD;  Location: [facility] ORS;  Service: General;  Laterality: N/A;   INSERT / REPLACE / [place] N/A 12/09/2014   Procedure: LEFT HEART CATHETERIZATION WITH CORONARY ANGIOGRAM;  Surgeon: Oscar Blanks, MD;  Location: [facility] CATH LAB;  Service: Cardiovascular;  Laterality: N/A;   RIGHT/LEFT HEART CATH AND CORONARY ANGIOGRAPHY Bilateral 12/14/2009   Procedure: RIGHT/LEFT HEART CATH AND CORONARY ANGIOGRAPHY; Location: [place]; Surgeon: Oscar Coho, MD   TONSILLECTOMY     as a child , adnoids removed   UPPER ESOPHAGEAL ENDOSCOPIC ULTRASOUND (EUS) N/A 11/25/2021   Procedure: UPPER ESOPHAGEAL ENDOSCOPIC ULTRASOUND (EUS);  Surgeon: Oscar [name]., MD;  Location: [facility];  Service: Gastroenterology;  Laterality: N/A;   Family History  Problem Relation Age of Onset   Hypertension Mother    Congestive Heart Failure Mother    Hypertension Sister    Diabetes Sister    Pancreatitis Sister    COPD Sister    Emphysema Sister        smoked   Pancreatitis Brother    Anemia Neg Hx    Arrhythmia Neg Hx    Asthma Neg Hx    Clotting disorder Neg Hx    Fainting Neg Hx    Heart attack Neg Hx    Heart disease Neg Hx    Heart failure Neg Hx    Hyperlipidemia Neg Hx    Social History   Tobacco Use   Smoking status: Every Day    Packs/day: 1.50    Years: 34.00    Total pack years: 51.00    Types:  Cigarettes    Passive exposure: Past   Smokeless tobacco: Never   Tobacco comments:    1/2 ppd [place], [place] 04/23/22   Substance Use Topics   Alcohol use: No   Allergies  Allergen Reactions   Ciprofloxacin Swelling and Other (See Comments)    Migraine Headache    Iodinated Contrast Media     Other reaction(s): Vomiting Projectile vomiting   Isosorb Dinitrate-Hydralazine Other (See Comments)    Migraine Headache   Prior to Admission medications   Medication Sig Start Date End Date Taking? Authorizing Provider  acetaminophen (TYLENOL) 500 MG tablet Take 1,000 mg by mouth every 6 (six) hours as needed for moderate pain or mild pain.   Yes [provider]  allopurinol (ZYLOPRIM) 300 MG tablet Take 300 mg by mouth daily.   Yes [provider]  atorvastatin (LIPITOR) 40 MG tablet Take 40 mg by mouth daily.   Yes [provider]  clindamycin (CLEOCIN-T) 1 % lotion Apply to affected areas daily after shower. 06/11/21  Yes [name], MD  colchicine 0.6 MG tablet Take 0.6 mg by mouth as needed (gout). 11/04/21  Yes [provider]  JARDIANCE 10 MG TABS tablet Take 10 mg by mouth daily. 06/25/22  Yes [provider]  ketoconazole (NIZORAL) 2 % cream Apply twice daily to neck until clear 06/11/21  Yes [name], MD  NON FORMULARY Pt uses a cpap nightly   Yes [provider]  Omeprazole 20 MG TBDD Take 20 mg by mouth daily.   Yes [provider]  sacubitril-valsartan (ENTRESTO) 49-51 MG Take 0.5 tablets by mouth 2 (two) times daily.   Yes [provider]  tazarotene (AVAGE) 0.1 % cream Apply topically at bedtime. 06/11/21  Yes [name], MD  torsemide (DEMADEX) 20 MG tablet Take 40 mg by mouth daily.   Yes [provider]  umeclidinium-vilanterol (ANORO ELLIPTA) 62.5-25 MCG/ACT AEPB Inhale 1 puff into the lungs daily. 04/23/22  Yes Tyler Pita, MD  VENTOLIN HFA 108 [phone] Base) MCG/ACT inhaler  Inhale 2 puffs into the lungs every 6 (six) hours as needed for shortness of breath. 11/23/20  Yes [provider]    Review of Systems  Constitutional:  Positive for fatigue. Negative for appetite change.  HENT:  Negative for congestion, postnasal drip and sore throat.   Eyes: Negative.   Respiratory:  Positive for cough, shortness of breath and wheezing (at times). Negative for chest tightness.   Cardiovascular:  Positive for palpitations (at times). Negative for chest pain and leg swelling.  Gastrointestinal:  Negative for abdominal distention, abdominal pain, diarrhea and nausea.  Endocrine: Negative.   Genitourinary: Negative.   Musculoskeletal:  Positive for back pain. Negative for arthralgias.  Skin: Negative.   Allergic/Immunologic: Negative.   Neurological:  Positive for dizziness (after coughing). Negative for light-headedness and headaches.  Hematological:  Negative for adenopathy. Does not bruise/bleed easily.  Psychiatric/Behavioral:  Negative for dysphoric mood and sleep disturbance (sleeping on 4 pillows with nocturnal ventilator/ oxygen). The patient is not nervous/anxious.    Vitals:   09/23/22 1015  BP: 103/84  Pulse: 87  Resp: 20  SpO2: 98%  Weight: 292 lb (132.5 kg)   Wt Readings from Last 3 Encounters:  09/23/22 292 lb (132.5 kg)  06/25/22 285 lb (129.3 kg)  04/23/22 280 lb (127 kg)   Lab Results  Component Value Date   CREATININE 1.75 (H) 12/25/2021   CREATININE 1.69 (H) 10/28/2021   CREATININE 1.14 10/05/2021   Physical Exam Vitals and nursing note reviewed.  Constitutional:      Appearance: He is well-developed.  HENT:     Head: Normocephalic and atraumatic.  Neck:     Vascular: No JVD.  Cardiovascular:     Rate and Rhythm: Normal rate and regular rhythm.  Pulmonary:     Effort: Pulmonary effort is normal.     Breath sounds: No wheezing or rales.  Abdominal:     General: There is distension.     Palpations: Abdomen is soft.      Tenderness: There is no abdominal tenderness.  Musculoskeletal:        General: No tenderness.     Cervical back: Normal range of motion and neck  supple.     Right lower leg: No edema.     Left lower leg: No edema.  Skin:    General: Skin is warm and dry.  Neurological:     Mental Status: He is alert and oriented to person, place, and time.  Psychiatric:        Behavior: Behavior normal.        Thought Content: Thought content normal.    Assessment & Plan:  1: Acute on Chronic heart failure with reduced ejection fraction- - NYHA class III - fluid overloaded today with weight gain, elevated ReDs reading and symptoms - weighing daily; Reminded to call for an overnight weight gain of >2 pounds or a weekly weight gain of >5 pounds.  - weight up 7 pounds from last visit here 3 months ago - AICD (2016) in place but denies any shocks "in a long time" - on GDMT of entreso & jardiance - ReDs reading elevated at 50% - increase torsemide to '40mg'$  BID for the next 3 days - check BMP today and again at next visit - echo scheduled on 10/20/22 with probable f/u with HF MD after that - BNP 12/26/20 was 136.9 - PharmD reconciled medications with the patient - he does not take the flu vaccine  2: HTN with CKD- - BP 103/84; may need to decreased entresto - sees PCP [name]) at [place] from 07/30/22 reviewed and showed sodium 135, potassium 4.7, Cr 1.71 and GFR 49 - saw nephrology Lanora Manis) 07/30/22  3: Obstructive sleep apnea- - wears nocturnal ventilator QHS, 8 hours/night; reports sleeping better - appears to be benefitting from this therapy - continues wearing oxygen at 3L at bedtime & PRN during the day - saw pulmonology Patsey Berthold) 04/23/22  4: Diabetes- - A1c from 07/30/22 was 7.4% - home glucose 124  5: Tobacco use- - smokes 1/2 ppd of cigarettes - complete cessation discussed for 3 minutes  6: Atrial fibrillation- - RRR in office today - saw  cardiology Dema Severin) 01/30/22    Patient did not bring his medications nor a list. Each medication was verbally reviewed with the patient and he was encouraged to bring the bottles to every visit to confirm accuracy of list.   Return in 3 days, sooner if needed.

## 2022-09-23 ENCOUNTER — Ambulatory Visit (HOSPITAL_BASED_OUTPATIENT_CLINIC_OR_DEPARTMENT_OTHER): Payer: 59 | Admitting: Family

## 2022-09-23 ENCOUNTER — Encounter: Payer: Self-pay | Admitting: Family

## 2022-09-23 ENCOUNTER — Other Ambulatory Visit
Admission: RE | Admit: 2022-09-23 | Discharge: 2022-09-23 | Disposition: A | Discharge status: Home / Self Care | Payer: 59 | Attending: Family | Admitting: Family

## 2022-09-23 ENCOUNTER — Other Ambulatory Visit: Payer: Self-pay | Admitting: Family

## 2022-09-23 ENCOUNTER — Encounter: Payer: Self-pay | Admitting: Pharmacy Technician

## 2022-09-23 VITALS — BP 103/84 | HR 87 | Resp 20 | Wt 292.0 lb

## 2022-09-23 DIAGNOSIS — I5043 Acute on chronic combined systolic (congestive) and diastolic (congestive) heart failure: Secondary | ICD-10-CM | POA: Insufficient documentation

## 2022-09-23 DIAGNOSIS — I130 Hypertensive heart and chronic kidney disease with heart failure and stage 1 through stage 4 chronic kidney disease, or unspecified chronic kidney disease: Secondary | ICD-10-CM | POA: Insufficient documentation

## 2022-09-23 DIAGNOSIS — Z79899 Other long term (current) drug therapy: Secondary | ICD-10-CM | POA: Insufficient documentation

## 2022-09-23 DIAGNOSIS — Z794 Long term (current) use of insulin: Secondary | ICD-10-CM

## 2022-09-23 DIAGNOSIS — E119 Type 2 diabetes mellitus without complications: Secondary | ICD-10-CM

## 2022-09-23 DIAGNOSIS — I420 Dilated cardiomyopathy: Secondary | ICD-10-CM | POA: Insufficient documentation

## 2022-09-23 DIAGNOSIS — Z9981 Dependence on supplemental oxygen: Secondary | ICD-10-CM | POA: Insufficient documentation

## 2022-09-23 DIAGNOSIS — Z95810 Presence of automatic (implantable) cardiac defibrillator: Secondary | ICD-10-CM | POA: Insufficient documentation

## 2022-09-23 DIAGNOSIS — E1122 Type 2 diabetes mellitus with diabetic chronic kidney disease: Secondary | ICD-10-CM | POA: Insufficient documentation

## 2022-09-23 DIAGNOSIS — K219 Gastro-esophageal reflux disease without esophagitis: Secondary | ICD-10-CM | POA: Insufficient documentation

## 2022-09-23 DIAGNOSIS — F17210 Nicotine dependence, cigarettes, uncomplicated: Secondary | ICD-10-CM | POA: Insufficient documentation

## 2022-09-23 DIAGNOSIS — I10 Essential (primary) hypertension: Secondary | ICD-10-CM | POA: Diagnosis not present

## 2022-09-23 DIAGNOSIS — M109 Gout, unspecified: Secondary | ICD-10-CM | POA: Insufficient documentation

## 2022-09-23 DIAGNOSIS — I5022 Chronic systolic (congestive) heart failure: Secondary | ICD-10-CM | POA: Diagnosis not present

## 2022-09-23 DIAGNOSIS — J449 Chronic obstructive pulmonary disease, unspecified: Secondary | ICD-10-CM | POA: Insufficient documentation

## 2022-09-23 DIAGNOSIS — I5023 Acute on chronic systolic (congestive) heart failure: Secondary | ICD-10-CM

## 2022-09-23 DIAGNOSIS — G4733 Obstructive sleep apnea (adult) (pediatric): Secondary | ICD-10-CM

## 2022-09-23 DIAGNOSIS — I471 Supraventricular tachycardia, unspecified: Secondary | ICD-10-CM | POA: Insufficient documentation

## 2022-09-23 DIAGNOSIS — Z7984 Long term (current) use of oral hypoglycemic drugs: Secondary | ICD-10-CM | POA: Insufficient documentation

## 2022-09-23 DIAGNOSIS — R0602 Shortness of breath: Secondary | ICD-10-CM | POA: Insufficient documentation

## 2022-09-23 DIAGNOSIS — J45909 Unspecified asthma, uncomplicated: Secondary | ICD-10-CM | POA: Insufficient documentation

## 2022-09-23 DIAGNOSIS — I4891 Unspecified atrial fibrillation: Secondary | ICD-10-CM | POA: Insufficient documentation

## 2022-09-23 DIAGNOSIS — R5383 Other fatigue: Secondary | ICD-10-CM | POA: Diagnosis not present

## 2022-09-23 DIAGNOSIS — N183 Chronic kidney disease, stage 3 unspecified: Secondary | ICD-10-CM | POA: Insufficient documentation

## 2022-09-23 DIAGNOSIS — E785 Hyperlipidemia, unspecified: Secondary | ICD-10-CM | POA: Insufficient documentation

## 2022-09-23 DIAGNOSIS — I252 Old myocardial infarction: Secondary | ICD-10-CM | POA: Insufficient documentation

## 2022-09-23 DIAGNOSIS — I480 Paroxysmal atrial fibrillation: Secondary | ICD-10-CM

## 2022-09-23 DIAGNOSIS — Z720 Tobacco use: Secondary | ICD-10-CM

## 2022-09-23 LAB — BASIC METABOLIC PANEL
Anion gap: 10 (ref 5–15)
BUN: 32 mg/dL — ABNORMAL HIGH (ref 6–20)
CO2: 30 mmol/L (ref 22–32)
Calcium: 8.9 mg/dL (ref 8.9–10.3)
Chloride: 97 mmol/L — ABNORMAL LOW (ref 98–111)
Creatinine, Ser: 1.66 mg/dL — ABNORMAL HIGH (ref 0.61–1.24)
GFR, Estimated: 51 mL/min — ABNORMAL LOW (ref >60)
Glucose, Bld: 169 mg/dL — ABNORMAL HIGH (ref 70–99)
Potassium: 4.1 mmol/L (ref 3.5–5.1)
Sodium: 137 mmol/L (ref 135–145)

## 2022-09-23 NOTE — Progress Notes (Signed)
ReDS Vest / Clip - 09/23/22 1100       ReDS Vest / Clip   Station Marker D    Ruler Value 38    ReDS Actual Value 50

## 2022-09-23 NOTE — Patient Instructions (Addendum)
Your physician has requested that you have an echocardiogram. Echocardiography is a painless test that uses sound waves to create images of your heart. It provides your doctor with information about the size and shape of your heart and how well your heart's chambers and valves are working. This procedure takes approximately one hour. There are no restrictions for this procedure. Please do NOT wear cologne, perfume, aftershave, or lotions (deodorant is allowed). Please arrive at [place] 15 minutes prior to your appointment time.  Echo is scheduled for Monday, February 19th, 2024 at 11 AM  Continue weighing daily and call for an overnight weight gain of 3 pounds or more or a weekly weight gain of more than 5 pounds.   If you have voicemail, please make sure your mailbox is cleaned out so that we may leave a message and please make sure to listen to any voicemails.    Increase your torsemide to 2 tablets in the morning and 2 tablets in the afternoon. I will call you after I get your lab work back if we need to do anything different.    Go to the [facility] to get your lab work drawn today

## 2022-09-23 NOTE — Progress Notes (Signed)
[facility] - PHARMACIST COUNSELING NOTE  Guideline-Directed Medical Therapy/Evidence Based Medicine  ACE/ARB/ARNI: Sacubitril-valsartan 24-26 mg twice daily (takes one-half of 49/51 mg tablets twice daily) Beta Blocker:  None Aldosterone Antagonist:  None Diuretic: Torsemide 40 mg twice daily SGLT2i: Empagliflozin 10 mg daily  Adherence Assessment  Do you ever forget to take your medication? '[]'$ Yes '[x]'$ No  Do you ever skip doses due to side effects? '[]'$ Yes '[x]'$ No  Do you have trouble affording your medicines? '[]'$ Yes '[x]'$ No  Are you ever unable to pick up your medication due to transportation difficulties? '[]'$ Yes '[x]'$ No  Do you ever stop taking your medications because you don't believe they are helping? '[]'$ Yes '[x]'$ No  Do you check your weight daily? '[]'$ Yes '[x]'$ No   Adherence strategy: Uses a pill box with alarms for doses that also syncs with his phone  Barriers to obtaining medications: None  Vital signs: HR 87, BP 103/84, weight (pounds) 292 ECHO: Date 10/2020, EF <20%     Latest Ref Rng & Units 09/23/2022   12:12 PM 12/25/2021    3:23 PM 10/28/2021    9:12 AM  BMP  Glucose 70 - 99 mg/dL 169  118  177   BUN 6 - 20 mg/dL 32  36  41   Creatinine 0.61 - 1.24 mg/dL 1.66  1.75  1.69   Sodium 135 - 145 mmol/L 137  137  134   Potassium 3.5 - 5.1 mmol/L 4.1  3.8  4.0   Chloride 98 - 111 mmol/L 97  100  95   CO2 22 - 32 mmol/L '30  28  27   '$ Calcium 8.9 - 10.3 mg/dL 8.9  9.0  9.7     Past Medical History:  Diagnosis Date   AICD (automatic cardioverter/defibrillator) present    Aortic atherosclerosis (HCC)    Asthma    Chronic respiratory failure (HCC)    CKD (chronic kidney disease), stage III (HCC)    COPD (chronic obstructive pulmonary disease) (HCC)    Deafness in right ear    Diabetes mellitus without complication (HCC)    Dyspnea    GERD (gastroesophageal reflux disease)    Gout    HFrEF (heart failure with reduced ejection  fraction) ([place])    a.) TTE 12/10/14: EF 25%; diff inf HK; sev LV and mod LA dil; LVH. b.) TTE 06/23/18: EF 20-25%; LVH; mild LV dil, mild BAE. c.) TTE 03/01/19: EF 15%, LVH, BAE; triv PR/TR. d.) TTE 02/27/20: EF < 20%; sev LV dil; mild MR. e.) TTE 11/23/20: EF < 20%; glob HK; sev LV dil; LVH; mild-mod BAE; mod-sev TR, triv AR; G1DD. f.) TTE 05/14/21: EF <15%; LVH; sev LA and mild RV enlar; triv AR/PR, mild TR, mod MR.   Hiatal hernia    History of cardiac catheterization    a.) R/LHC 12/14/2009: normal coronaries. b.) R/LHC 12/11/2014: normal coronaries.   Hyperlipidemia    Hypertension    Hypoxemia    LBBB (left bundle branch block)    NICM (nonischemic cardiomyopathy; dilated cardiomyopathy) ([place])    a.) R/LHC 12/14/2009: normal cors; LVEDP 18 mmHg, mean PA 29 mmHg, mean PCWP 31 mmHg; CO 6 L/min; CI 2.54 L/min/m. b.) TTE 12/10/2014: EF 25%. c.) R/LHC 12/11/2014: mean RA 9 mmHg, mean PA 22 mmHg, mean PCWP 22 mmHg. d.) TTE 06/23/2018: EF 20-25%. e.) TTE 03/01/2019: EF 15%. f.) TTE 02/27/2020: < 20%. g.) TTE 11/23/2020: EF <20%. h.) TTE 05/14/2021: < 15%.   NSTEMI (non-ST elevated myocardial  infarction) ([place])    a.) x 3 per patient report ---> 2012, 2014, 2016   Obesity    On supplemental oxygen by nasal cannula    a.) 2-3 L/[place] PRN   OSA on CPAP    PAF (paroxysmal atrial fibrillation) ([place])    a.) CHA2DS2-VASc = 4 (HFrEF, HTN, prior MI, T2DM). b.) rate/rhythm maintained without pharmacologial intervention; no current anticoagulation.   Pancreatitis    PSVT (paroxysmal supraventricular tachycardia)     ASSESSMENT 47 year old male with PMH HTN, h/o NSTEMI x 3, Afib, ICD, T2DM, CKD3a, HLD who presents to the HF clinic for follow-up. Most recent ECHO in 10/2020 showed EF <20%. Regarding GDMT, patient takes Entresto 49/51 mg one-half tablet twice daily and Jardiance 10 mg daily. Patient also takes torsemide 40 mg daily. Patient's blood pressure is low today, 103/84, which is consistent with his home BP  readings (he checks 2-3x a week) which are low (patient did not bring a log today). Patient also endorses dizziness and wooziness, as well as brief light-headedness when he coughs. In general, he says he feels tired. Patient endorses urinating less often than usual lately.  Recent ED Visit (past 6 months): None  PLAN CHF/HTN Per discussion with Darylene Price, for low BP, one option would be to decrease Entresto to 24/26 mg tablet and instruct patient to cut in half Continue Jardiance and torsemide  HLD w/ history of NSTEMI x 3 Will phone [facility] is his PCP) to see a recent LDL level Target LDL <55 Update - 1/23 @ 1602 : LDL 184 in 03/2022 Patient to be seen back in clinic this Friday, 09/2022 >> plan to adjust lipid management then Continue atorvastatin  Afib CHADSVASc 4 AICD present No current anticoagulation  Time spent: 20 minutes  Will M. Ouida Sills, PharmD PGY-1 Pharmacy Resident 09/23/2022 4:10 PM   Current Outpatient Medications:    acetaminophen (TYLENOL) 500 MG tablet, Take 1,000 mg by mouth every 6 (six) hours as needed for moderate pain or mild pain., Disp: , Rfl:    allopurinol (ZYLOPRIM) 300 MG tablet, Take 300 mg by mouth daily., Disp: , Rfl:    atorvastatin (LIPITOR) 40 MG tablet, Take 40 mg by mouth daily., Disp: , Rfl:    clindamycin (CLEOCIN-T) 1 % lotion, Apply to affected areas daily after shower., Disp: 60 mL, Rfl: 3   colchicine 0.6 MG tablet, Take 0.6 mg by mouth as needed (gout)., Disp: , Rfl:    JARDIANCE 10 MG TABS tablet, Take 10 mg by mouth daily., Disp: , Rfl:    ketoconazole (NIZORAL) 2 % cream, Apply twice daily to neck until clear, Disp: 60 g, Rfl: 2   NON FORMULARY, Pt uses a cpap nightly, Disp: , Rfl:    Omeprazole 20 MG TBDD, Take 20 mg by mouth daily., Disp: , Rfl:    sacubitril-valsartan (ENTRESTO) 49-51 MG, Take 0.5 tablets by mouth 2 (two) times daily., Disp: , Rfl:    tazarotene (AVAGE) 0.1 % cream, Apply  topically at bedtime., Disp: 60 g, Rfl: 3   torsemide (DEMADEX) 20 MG tablet, Take 40 mg by mouth daily., Disp: , Rfl:    umeclidinium-vilanterol (ANORO ELLIPTA) 62.5-25 MCG/ACT AEPB, Inhale 1 puff into the lungs daily., Disp: 60 each, Rfl: 6   VENTOLIN HFA 108 (90 Base) MCG/ACT inhaler, Inhale 2 puffs into the lungs every 6 (six) hours as needed for shortness of breath., Disp: , Rfl:    DRUGS TO CAUTION IN HEART  FAILURE  Drug or Class Mechanism  Analgesics NSAIDs COX-2 inhibitors Glucocorticoids  Sodium and water retention, increased systemic vascular resistance, decreased response to diuretics   Diabetes Medications Metformin Thiazolidinediones Rosiglitazone (Avandia) Pioglitazone (Actos) DPP4 Inhibitors Saxagliptin (Onglyza) Sitagliptin (Januvia)   Lactic acidosis Possible calcium channel blockade   Unknown  Antiarrhythmics Class I  Flecainide Disopyramide Class III Sotalol Other Dronedarone  Negative inotrope, proarrhythmic   Proarrhythmic, beta blockade  Negative inotrope  Antihypertensives Alpha Blockers Doxazosin Calcium Channel Blockers Diltiazem Verapamil Nifedipine Central Alpha Adrenergics Moxonidine Peripheral Vasodilators Minoxidil  Increases renin and aldosterone  Negative inotrope    Possible sympathetic withdrawal  Unknown  Anti-infective Itraconazole Amphotericin B  Negative inotrope Unknown  Hematologic Anagrelide Cilostazol   Possible inhibition of PD IV Inhibition of PD III causing arrhythmias  Neurologic/Psychiatric Stimulants Anti-Seizure Drugs Carbamazepine Pregabalin Antidepressants Tricyclics Citalopram Parkinsons Bromocriptine Pergolide Pramipexole Antipsychotics Clozapine Antimigraine Ergotamine Methysergide Appetite suppressants Bipolar Lithium  Peripheral alpha and beta agonist activity  Negative inotrope and chronotrope Calcium channel blockade  Negative inotrope,  proarrhythmic Dose-dependent QT prolongation  Excessive serotonin activity/valvular damage Excessive serotonin activity/valvular damage Unknown  IgE mediated hypersensitivy, calcium channel blockade  Excessive serotonin activity/valvular damage Excessive serotonin activity/valvular damage Valvular damage  Direct myofibrillar degeneration, adrenergic stimulation  Antimalarials Chloroquine Hydroxychloroquine Intracellular inhibition of lysosomal enzymes  Urologic Agents Alpha Blockers Doxazosin Prazosin Tamsulosin Terazosin  Increased renin and aldosterone  Adapted from Page Carleene Overlie, et al. "Drugs That May Cause or Exacerbate Heart Failure: A Scientific Statement from the American Heart  Association." Circulation 2016; 134:e32-e69. DOI: 10.1161/CIR.0000000000000426   MEDICATION ADHERENCES TIPS AND STRATEGIES Taking medication as prescribed improves patient outcomes in heart failure (reduces hospitalizations, improves symptoms, increases survival) Side effects of medications can be managed by decreasing doses, switching agents, stopping drugs, or adding additional therapy. Please let someone in the [facility] know if you have having bothersome side effects so we can modify your regimen. Do not alter your medication regimen without talking to [place].  Medication reminders can help patients remember to take drugs on time. If you are missing or forgetting doses you can try linking behaviors, using pill boxes, or an electronic reminder like an alarm on your phone or an app. Some people can also get automated phone calls as medication reminders.

## 2022-09-26 ENCOUNTER — Encounter: Payer: 59 | Admitting: Family

## 2022-09-29 NOTE — Progress Notes (Unsigned)
Patient ID: ACXEL DINGEE, male    DOB: July 25, 1976, 47 y.o.   MRN: [id number]  Oscar [name] is a 47 y/o male with a history of obstructive sleep apnea (currently with CPAP), MI, HTN, hyperlipidemia, GERD, DM, SVT, asthma and chronic heart failure.  Echo report from 11/23/20 reviewed and showed an EF of <20% along with moderate LVH and moderately elevated PA pressure. Echo done 02/27/20 reviewed and showed an EF of <20%. Echo done 03/01/2019 which showed an EF of 15% along with mild Oscar. Echo done 06/23/18 reviewed and showed an EF of 20-25% along with mild Oscar. Echo done 02/06/15 which showed an EF of 25% which is unchanged from previous echo on 12/10/14 which showed an EF of 25% and mild Oscar.   Has not been admitted or been in the ED in the last 6 months.   He presents today for a follow-up visit with a chief complaint of moderate SOB with minimal exertion. Describes this as chronic in nature. Has associated fatigue, cough & slight weight gain along with this. Denies any dizziness, difficulty sleeping, abdominal distention, palpitations, pedal edema, chest pain, wheezing or chest tightness.   [place] '40mg'$  torsemide BID for 3 days.   Currently wearing nocturnal ventilator. He says that he sleeps much better since he's been wearing this.   Past Medical History:  Diagnosis Date   AICD (automatic cardioverter/defibrillator) present    Aortic atherosclerosis (HCC)    Asthma    Chronic respiratory failure (HCC)    CKD (chronic kidney disease), stage III (HCC)    COPD (chronic obstructive pulmonary disease) (HCC)    Deafness in right ear    Diabetes mellitus without complication (HCC)    Dyspnea    GERD (gastroesophageal reflux disease)    Gout    HFrEF (heart failure with reduced ejection fraction) ([place])    a.) TTE 12/10/14: EF 25%; diff inf HK; sev LV and mod LA dil; LVH. b.) TTE 06/23/18: EF 20-25%; LVH; mild LV dil, mild BAE. c.) TTE 03/01/19: EF 15%, LVH, BAE; triv PR/TR. d.) TTE 02/27/20: EF < 20%;  sev LV dil; mild Oscar. e.) TTE 11/23/20: EF < 20%; glob HK; sev LV dil; LVH; mild-mod BAE; mod-sev TR, triv AR; G1DD. f.) TTE 05/14/21: EF <15%; LVH; sev LA and mild RV enlar; triv AR/PR, mild TR, mod Oscar.   Hiatal hernia    History of cardiac catheterization    a.) R/LHC 12/14/2009: normal coronaries. b.) R/LHC 12/11/2014: normal coronaries.   Hyperlipidemia    Hypertension    Hypoxemia    LBBB (left bundle branch block)    NICM (nonischemic cardiomyopathy; dilated cardiomyopathy) ([place])    a.) R/LHC 12/14/2009: normal cors; LVEDP 18 mmHg, mean PA 29 mmHg, mean PCWP 31 mmHg; CO 6 L/min; CI 2.54 L/min/m. b.) TTE 12/10/2014: EF 25%. c.) R/LHC 12/11/2014: mean RA 9 mmHg, mean PA 22 mmHg, mean PCWP 22 mmHg. d.) TTE 06/23/2018: EF 20-25%. e.) TTE 03/01/2019: EF 15%. f.) TTE 02/27/2020: < 20%. g.) TTE 11/23/2020: EF <20%. h.) TTE 05/14/2021: < 15%.   NSTEMI (non-ST elevated myocardial infarction) ([place])    a.) x 3 per patient report ---> 2012, 2014, 2016   Obesity    On supplemental oxygen by nasal cannula    a.) 2-3 L/ PRN   OSA on CPAP    PAF (paroxysmal atrial fibrillation) ([place])    a.) CHA2DS2-VASc = 4 (HFrEF, HTN, prior MI, T2DM). b.) rate/rhythm maintained without pharmacologial intervention; no current anticoagulation.  Pancreatitis    PSVT (paroxysmal supraventricular tachycardia)    Past Surgical History:  Procedure Laterality Date   BIOPSY  11/25/2021   Procedure: BIOPSY;  Surgeon: Rush Landmark Telford Nab., MD;  Location: [facility];  Service: Gastroenterology;;   CARDIAC CATHETERIZATION  12/11/2014   Procedure: RIGHT/LEFT HEART CATH AND CORONARY ANGIOGRAPHY;  Surgeon: Lorretta Harp, MD;  Location: [facility] CATH LAB;  Service: Cardiovascular;;   COLONOSCOPY WITH PROPOFOL N/A 11/13/2021   Procedure: COLONOSCOPY WITH PROPOFOL;  Surgeon: Lin Landsman, MD;  Location[facility] ENDOSCOPY;  Service: Gastroenterology;  Laterality: N/A;   ESOPHAGOGASTRODUODENOSCOPY (EGD) WITH PROPOFOL N/A  11/25/2021   Procedure: ESOPHAGOGASTRODUODENOSCOPY (EGD) WITH PROPOFOL;  Surgeon: Rush Landmark Telford Nab., MD;  Location: [facility] ENDOSCOPY;  Service: Gastroenterology;  Laterality: N/A;   ICD LEAD REMOVAL N/A 03/30/2015   Procedure: ICD LEAD REMOVAL;  Surgeon: Marzetta Board, MD;  Location: [facility] ORS;  Service: Cardiovascular;  Laterality: N/A;   IMPLANTABLE CARDIOVERTER DEFIBRILLATOR IMPLANT     INCISION AND DRAINAGE ABSCESS N/A 12/12/2020   Procedure: INCISION AND DRAINAGE ABSCESS;  Surgeon: Jules Husbands, MD;  Location: [facility] ORS;  Service: General;  Laterality: N/A;   INSERT / REPLACE / [place] N/A 12/09/2014   Procedure: LEFT HEART CATHETERIZATION WITH CORONARY ANGIOGRAM;  Surgeon: Burnell Blanks, MD;  Location: [facility] CATH LAB;  Service: Cardiovascular;  Laterality: N/A;   RIGHT/LEFT HEART CATH AND CORONARY ANGIOGRAPHY Bilateral 12/14/2009   Procedure: RIGHT/LEFT HEART CATH AND CORONARY ANGIOGRAPHY; Location: [place]; Surgeon: Katrine Coho, MD   TONSILLECTOMY     as a child , adnoids removed   UPPER ESOPHAGEAL ENDOSCOPIC ULTRASOUND (EUS) N/A 11/25/2021   Procedure: UPPER ESOPHAGEAL ENDOSCOPIC ULTRASOUND (EUS);  Surgeon: Irving Copas., MD;  Location: [facility];  Service: Gastroenterology;  Laterality: N/A;   Family History  Problem Relation Age of Onset   Hypertension Mother    Congestive Heart Failure Mother    Hypertension Sister    Diabetes Sister    Pancreatitis Sister    COPD Sister    Emphysema Sister        smoked   Pancreatitis Brother    Anemia Neg Hx    Arrhythmia Neg Hx    Asthma Neg Hx    Clotting disorder Neg Hx    Fainting Neg Hx    Heart attack Neg Hx    Heart disease Neg Hx    Heart failure Neg Hx    Hyperlipidemia Neg Hx    Social History   Tobacco Use   Smoking status: Every Day    Packs/day: 1.50    Years: 34.00    Total pack years: 51.00    Types: Cigarettes    Passive  exposure: Past   Smokeless tobacco: Never   Tobacco comments:    1/2 ppd Oscar [name], Oscar [name] 04/23/22   Substance Use Topics   Alcohol use: No   Allergies  Allergen Reactions   Ciprofloxacin Swelling and Other (See Comments)    Migraine Headache    Iodinated Contrast Media     Other reaction(s): Vomiting Projectile vomiting   Isosorb Dinitrate-Hydralazine Other (See Comments)    Migraine Headache   Prior to Admission medications   Medication Sig Start Date End Date Taking? Authorizing Provider  acetaminophen (TYLENOL) 500 MG tablet Take 1,000 mg by mouth every 6 (six) hours as needed for moderate pain or mild pain.   Yes [provider]  allopurinol (ZYLOPRIM) 300 MG  tablet Take 300 mg by mouth daily.   Yes [provider]  atorvastatin (LIPITOR) 40 MG tablet Take 40 mg by mouth daily.   Yes [provider]  clindamycin (CLEOCIN-T) 1 % lotion Apply to affected areas daily after shower. 06/11/21  Yes [name], MD  colchicine 0.6 MG tablet Take 0.6 mg by mouth as needed (gout). 11/04/21  Yes [provider]  JARDIANCE 10 MG TABS tablet Take 10 mg by mouth daily. 06/25/22  Yes [provider]  ketoconazole (NIZORAL) 2 % cream Apply twice daily to neck until clear 06/11/21  Yes [name], MD  NON FORMULARY Pt uses a cpap nightly   Yes [provider]  Omeprazole 20 MG TBDD Take 20 mg by mouth daily.   Yes [provider]  sacubitril-valsartan (ENTRESTO) 49-51 MG Take 0.5 tablets by mouth 2 (two) times daily.   Yes [provider]  tazarotene (AVAGE) 0.1 % cream Apply topically at bedtime. 06/11/21  Yes [name], MD  torsemide (DEMADEX) 20 MG tablet Take 40 mg by mouth daily.   Yes [provider]  umeclidinium-vilanterol (ANORO ELLIPTA) 62.5-25 MCG/ACT AEPB Inhale 1 puff into the lungs daily. 04/23/22  Yes Tyler Pita, MD  VENTOLIN HFA 108 [phone] Base) MCG/ACT inhaler Inhale 2 puffs into the  lungs every 6 (six) hours as needed for shortness of breath. 11/23/20  Yes [provider]   Review of Systems  Constitutional:  Positive for fatigue. Negative for appetite change.  HENT:  Negative for congestion, postnasal drip and sore throat.   Eyes: Negative.   Respiratory:  Positive for cough and shortness of breath. Negative for chest tightness and wheezing.   Cardiovascular:  Negative for chest pain, palpitations and leg swelling.  Gastrointestinal:  Negative for abdominal distention, abdominal pain, diarrhea and nausea.  Endocrine: Negative.   Genitourinary: Negative.   Musculoskeletal:  Negative for arthralgias and back pain.  Skin: Negative.   Allergic/Immunologic: Negative.   Neurological:  Negative for dizziness, light-headedness and headaches.  Hematological:  Negative for adenopathy. Does not bruise/bleed easily.  Psychiatric/Behavioral:  Negative for dysphoric mood and sleep disturbance (sleeping on 4 pillows with nocturnal ventilator/ oxygen). The patient is not nervous/anxious.    Vitals:   09/30/22 1414  BP: 121/88  Pulse: 91  Resp: 20  SpO2: 97%  Weight: 294 lb 8 oz (133.6 kg)   Wt Readings from Last 3 Encounters:  09/30/22 294 lb 8 oz (133.6 kg)  09/23/22 292 lb (132.5 kg)  06/25/22 285 lb (129.3 kg)   Lab Results  Component Value Date   CREATININE 1.63 (H) 09/30/2022   CREATININE 1.66 (H) 09/23/2022   CREATININE 1.75 (H) 12/25/2021   Physical Exam Vitals and nursing note reviewed.  Constitutional:      Appearance: He is well-developed.  HENT:     Head: Normocephalic and atraumatic.  Neck:     Vascular: No JVD.  Cardiovascular:     Rate and Rhythm: Normal rate and regular rhythm.  Pulmonary:     Effort: Pulmonary effort is normal.     Breath sounds: No wheezing or rales.  Abdominal:     General: There is no distension.     Palpations: Abdomen is soft.     Tenderness: There is no abdominal tenderness.  Musculoskeletal:        General:  No tenderness.     Cervical back: Normal range of motion and neck supple.     Right lower leg: No  edema.     Left lower leg: No edema.  Skin:    General: Skin is warm and dry.  Neurological:     Mental Status: He is alert and oriented to person, place, and time.  Psychiatric:        Behavior: Behavior normal.        Thought Content: Thought content normal.     Assessment & Plan:  1: Chronic heart failure with reduced ejection fraction- - NYHA class III - continues to be fluid overloaded with weight gain and elevated ReDs reading - weighing daily; Reminded to call for an overnight weight gain of >2 pounds or a weekly weight gain of >5 pounds.  - weight up 2.8 pounds from last visit here 1 week ago - AICD (2016) in place but denies any shocks "in a long time" - on GDMT of entreso & jardiance - ReDs reading last week was elevated at 50%; today is 49% - torsemide '40mg'$  BID for extra 3 days last week; will resume '40mg'$  BID and decide about potassium usage after getting lab results back - consider adding low dose metoprolol/ bisoprolol - check BMP/ lipid panel today  - echo scheduled on 10/20/22 with probable f/u with HF MD after that - BNP 12/26/20 was 136.9 - PharmD reconciled medications with the patient - he does not take the flu vaccine  2: HTN with CKD- - BP 121/88 - sees PCP (Mancheno) at [place] from 09/23/22 reviewed and showed sodium 137, potassium 4.1, Cr 1.66 and GFR 51 - saw nephrology Lanora Manis) 07/30/22  3: Obstructive sleep apnea- - wears nocturnal ventilator QHS, 8 hours/night; reports sleeping better - appears to be benefitting from this therapy - continues wearing oxygen at 3L at bedtime & PRN during the day - saw pulmonology Patsey Berthold) 04/23/22  4: Diabetes- - A1c from 07/30/22 was 7.4%  5: Tobacco use- - smokes 1/2 ppd of cigarettes - complete cessation discussed for 3 minutes  6: Atrial fibrillation- - RRR in office  today - saw cardiology Dema Severin) 01/30/22   Patient did not bring his medications nor a list. Each medication was verbally reviewed with the patient and he was encouraged to bring the bottles to every visit to confirm accuracy of list.   Return in 3 weeks, sooner if needed.

## 2022-09-30 ENCOUNTER — Encounter: Payer: Self-pay | Admitting: Family

## 2022-09-30 ENCOUNTER — Other Ambulatory Visit
Admission: RE | Admit: 2022-09-30 | Discharge: 2022-09-30 | Disposition: A | Discharge status: Home / Self Care | Payer: 59 | Source: Ambulatory Visit | Attending: Family | Admitting: Family

## 2022-09-30 ENCOUNTER — Ambulatory Visit (HOSPITAL_BASED_OUTPATIENT_CLINIC_OR_DEPARTMENT_OTHER): Payer: 59 | Admitting: Family

## 2022-09-30 ENCOUNTER — Encounter: Payer: Self-pay | Admitting: Pharmacy Technician

## 2022-09-30 VITALS — BP 121/88 | HR 91 | Resp 20 | Wt 294.5 lb

## 2022-09-30 DIAGNOSIS — E119 Type 2 diabetes mellitus without complications: Secondary | ICD-10-CM

## 2022-09-30 DIAGNOSIS — I10 Essential (primary) hypertension: Secondary | ICD-10-CM

## 2022-09-30 DIAGNOSIS — I5023 Acute on chronic systolic (congestive) heart failure: Secondary | ICD-10-CM | POA: Diagnosis not present

## 2022-09-30 DIAGNOSIS — G4733 Obstructive sleep apnea (adult) (pediatric): Secondary | ICD-10-CM | POA: Diagnosis not present

## 2022-09-30 DIAGNOSIS — Z720 Tobacco use: Secondary | ICD-10-CM

## 2022-09-30 DIAGNOSIS — I5022 Chronic systolic (congestive) heart failure: Secondary | ICD-10-CM

## 2022-09-30 DIAGNOSIS — I480 Paroxysmal atrial fibrillation: Secondary | ICD-10-CM

## 2022-09-30 DIAGNOSIS — Z794 Long term (current) use of insulin: Secondary | ICD-10-CM

## 2022-09-30 LAB — LIPID PANEL
Cholesterol: 223 mg/dL — ABNORMAL HIGH (ref 0–200)
HDL: 35 mg/dL — ABNORMAL LOW (ref >40)
LDL Cholesterol: 168 mg/dL — ABNORMAL HIGH (ref 0–99)
Total CHOL/HDL Ratio: 6.4 RATIO
Triglycerides: 98 mg/dL (ref <150)
VLDL: 20 mg/dL (ref 0–40)

## 2022-09-30 LAB — BASIC METABOLIC PANEL
Anion gap: 10 (ref 5–15)
BUN: 34 mg/dL — ABNORMAL HIGH (ref 6–20)
CO2: 29 mmol/L (ref 22–32)
Calcium: 9.2 mg/dL (ref 8.9–10.3)
Chloride: 99 mmol/L (ref 98–111)
Creatinine, Ser: 1.63 mg/dL — ABNORMAL HIGH (ref 0.61–1.24)
GFR, Estimated: 52 mL/min — ABNORMAL LOW (ref >60)
Glucose, Bld: 172 mg/dL — ABNORMAL HIGH (ref 70–99)
Potassium: 3.7 mmol/L (ref 3.5–5.1)
Sodium: 138 mmol/L (ref 135–145)

## 2022-09-30 NOTE — Patient Instructions (Addendum)
Continue weighing daily and call for an overnight weight gain of 3 pounds or more or a weekly weight gain of more than 5 pounds.   If you have voicemail, please make sure your mailbox is cleaned out so that we may leave a message and please make sure to listen to any voicemails.    If you receive a satisfaction survey regarding the [facility], please take the time to fill it out. This way we can continue to provide excellent care and make any changes that need to be made.     Start taking your torsemide as 2 tablets every morning and 2 tablets every afternoon

## 2022-09-30 NOTE — Progress Notes (Signed)
ReDS Vest / Clip - 09/30/22 1400       ReDS Vest / Clip   Station Marker D    Ruler Value 38    ReDS Actual Value 49

## 2022-09-30 NOTE — Progress Notes (Signed)
[place] - PHARMACIST COUNSELING NOTE  Guideline-Directed Medical Therapy/Evidence Based Medicine  ACE/ARB/ARNI: Sacubitril-valsartan 49-51 mg twice daily Beta Blocker:  None Aldosterone Antagonist:  None Diuretic: Torsemide 40 mg twice daily (due to elevated ReDS) SGLT2i: Empagliflozin 10 mg daily  Adherence Assessment  Do you ever forget to take your medication? '[]'$ Yes '[x]'$ No  Do you ever skip doses due to side effects? '[]'$ Yes '[x]'$ No  Do you have trouble affording your medicines? '[]'$ Yes '[x]'$ No  Are you ever unable to pick up your medication due to transportation difficulties? '[]'$ Yes '[x]'$ No  Do you ever stop taking your medications because you don't believe they are helping? '[]'$ Yes '[x]'$ No  Do you check your weight daily? '[]'$ Yes '[x]'$ No   Adherence strategy: Uses a pill box with alarms for doses that also syncs with his phone   Barriers to obtaining medications: None  Vital signs: HR 91, BP 121/88, weight (pounds) 294 ECHO: Date 10/2020, EF <20%     Latest Ref Rng & Units 09/30/2022    3:18 PM 09/23/2022   12:12 PM 12/25/2021    3:23 PM  BMP  Glucose 70 - 99 mg/dL 172  169  118   BUN 6 - 20 mg/dL 34  32  36   Creatinine 0.61 - 1.24 mg/dL 1.63  1.66  1.75   Sodium 135 - 145 mmol/L 138  137  137   Potassium 3.5 - 5.1 mmol/L 3.7  4.1  3.8   Chloride 98 - 111 mmol/L 99  97  100   CO2 22 - 32 mmol/L '29  30  28   '$ Calcium 8.9 - 10.3 mg/dL 9.2  8.9  9.0     Past Medical History:  Diagnosis Date   AICD (automatic cardioverter/defibrillator) present    Aortic atherosclerosis (HCC)    Asthma    Chronic respiratory failure (HCC)    CKD (chronic kidney disease), stage III (HCC)    COPD (chronic obstructive pulmonary disease) (HCC)    Deafness in right ear    Diabetes mellitus without complication (HCC)    Dyspnea    GERD (gastroesophageal reflux disease)    Gout    HFrEF (heart failure with reduced ejection fraction) ([place])    a.) TTE  12/10/14: EF 25%; diff inf HK; sev LV and mod LA dil; LVH. b.) TTE 06/23/18: EF 20-25%; LVH; mild LV dil, mild BAE. c.) TTE 03/01/19: EF 15%, LVH, BAE; triv PR/TR. d.) TTE 02/27/20: EF < 20%; sev LV dil; mild MR. e.) TTE 11/23/20: EF < 20%; glob HK; sev LV dil; LVH; mild-mod BAE; mod-sev TR, triv AR; G1DD. f.) TTE 05/14/21: EF <15%; LVH; sev LA and mild RV enlar; triv AR/PR, mild TR, mod MR.   Hiatal hernia    History of cardiac catheterization    a.) R/LHC 12/14/2009: normal coronaries. b.) R/LHC 12/11/2014: normal coronaries.   Hyperlipidemia    Hypertension    Hypoxemia    LBBB (left bundle branch block)    NICM (nonischemic cardiomyopathy; dilated cardiomyopathy) ([name])    a.) R/LHC 12/14/2009: normal cors; LVEDP 18 mmHg, mean PA 29 mmHg, mean PCWP 31 mmHg; CO 6 L/min; CI 2.54 L/min/m. b.) TTE 12/10/2014: EF 25%. c.) R/LHC 12/11/2014: mean RA 9 mmHg, mean PA 22 mmHg, mean PCWP 22 mmHg. d.) TTE 06/23/2018: EF 20-25%. e.) TTE 03/01/2019: EF 15%. f.) TTE 02/27/2020: < 20%. g.) TTE 11/23/2020: EF <20%. h.) TTE 05/14/2021: < 15%.   NSTEMI (non-ST elevated myocardial infarction) ([place])  a.) x 3 per patient report ---> 2012, 2014, 2016   Obesity    On supplemental oxygen by nasal cannula    a.) 2-3 L/[place] PRN   OSA on CPAP    PAF (paroxysmal atrial fibrillation) ([place])    a.) CHA2DS2-VASc = 4 (HFrEF, HTN, prior MI, T2DM). b.) rate/rhythm maintained without pharmacologial intervention; no current anticoagulation.   Pancreatitis    PSVT (paroxysmal supraventricular tachycardia)     ASSESSMENT 47 year old male with PMH HTN, h/o NSTEMI x 3, Afib, ICD, T2DM, CKD3a, HLD who presents to the HF clinic for follow-up. Most recent ECHO in 10/2020 showed EF <20%. Regarding GDMT, patient takes Entresto 49/51 mg one-half tablet twice daily and Jardiance 10 mg daily. Patient also takes torsemide 40 mg daily, but has been taking it twice daily after clinic last week and elevated ReDS reading. ReDS reading last week was  elevated at 50%, here today for repeat ReDS reading >> still elevated at 49%. BMP and lipid panel will be checked today as well.  Recent ED Visit (past 6 months): None  PLAN  Reconciled medications. No changes since last week aside from increased furosemide dosing.  CHF/HTN Per discussion with Darylene Price, will continue with torsemide 40 mg twice daily  HLD w/ h/o NSTEMI x 3 LDL today 169 (goal <55) Recommend switching from atorvastatin to rosuvastatin 40 mg daily and initiating ezetimibe 10 mg daily Update 1/31 @ 0900: Called and spoke with patient. Patient reports that he has not been taking the atorvastatin at all because he thought he had to take it at night and he forgets nighttime dosing. I instructed him to resume taking atorvastatin 80 mg once daily and that he can actually take it in the morning with his other medications. Patient is in agreement with plan to resume atorvastatin at this point instead of switching to rosuvastatin + ezetimibe. Follow-up lipid panel in 2-3 months    Time spent: 10 minutes  Will M. Ouida Sills, PharmD PGY-1 Pharmacy Resident 09/30/2022 8:23 PM   Current Outpatient Medications:    acetaminophen (TYLENOL) 500 MG tablet, Take 1,000 mg by mouth every 6 (six) hours as needed for moderate pain or mild pain., Disp: , Rfl:    allopurinol (ZYLOPRIM) 300 MG tablet, Take 300 mg by mouth daily., Disp: , Rfl:    atorvastatin (LIPITOR) 40 MG tablet, Take 40 mg by mouth daily., Disp: , Rfl:    clindamycin (CLEOCIN-T) 1 % lotion, Apply to affected areas daily after shower., Disp: 60 mL, Rfl: 3   colchicine 0.6 MG tablet, Take 0.6 mg by mouth as needed (gout)., Disp: , Rfl:    JARDIANCE 10 MG TABS tablet, Take 10 mg by mouth daily., Disp: , Rfl:    ketoconazole (NIZORAL) 2 % cream, Apply twice daily to neck until clear, Disp: 60 g, Rfl: 2   NON FORMULARY, Pt uses a cpap nightly, Disp: , Rfl:    Omeprazole 20 MG TBDD, Take 20 mg by mouth daily., Disp: , Rfl:     sacubitril-valsartan (ENTRESTO) 49-51 MG, Take 0.5 tablets by mouth 2 (two) times daily., Disp: , Rfl:    tazarotene (AVAGE) 0.1 % cream, Apply topically at bedtime., Disp: 60 g, Rfl: 3   torsemide (DEMADEX) 20 MG tablet, Take 40 mg by mouth daily., Disp: , Rfl:    umeclidinium-vilanterol (ANORO ELLIPTA) 62.5-25 MCG/ACT AEPB, Inhale 1 puff into the lungs daily., Disp: 60 each, Rfl: 6   VENTOLIN HFA 108 (90 Base) MCG/ACT inhaler, Inhale  2 puffs into the lungs every 6 (six) hours as needed for shortness of breath., Disp: , Rfl:    DRUGS TO CAUTION IN HEART FAILURE  Drug or Class Mechanism  Analgesics NSAIDs COX-2 inhibitors Glucocorticoids  Sodium and water retention, increased systemic vascular resistance, decreased response to diuretics   Diabetes Medications Metformin Thiazolidinediones Rosiglitazone (Avandia) Pioglitazone (Actos) DPP4 Inhibitors Saxagliptin (Onglyza) Sitagliptin (Januvia)   Lactic acidosis Possible calcium channel blockade   Unknown  Antiarrhythmics Class I  Flecainide Disopyramide Class III Sotalol Other Dronedarone  Negative inotrope, proarrhythmic   Proarrhythmic, beta blockade  Negative inotrope  Antihypertensives Alpha Blockers Doxazosin Calcium Channel Blockers Diltiazem Verapamil Nifedipine Central Alpha Adrenergics Moxonidine Peripheral Vasodilators Minoxidil  Increases renin and aldosterone  Negative inotrope    Possible sympathetic withdrawal  Unknown  Anti-infective Itraconazole Amphotericin B  Negative inotrope Unknown  Hematologic Anagrelide Cilostazol   Possible inhibition of PD IV Inhibition of PD III causing arrhythmias  Neurologic/Psychiatric Stimulants Anti-Seizure Drugs Carbamazepine Pregabalin Antidepressants Tricyclics Citalopram Parkinsons Bromocriptine Pergolide Pramipexole Antipsychotics Clozapine Antimigraine Ergotamine Methysergide Appetite suppressants Bipolar Lithium  Peripheral  alpha and beta agonist activity  Negative inotrope and chronotrope Calcium channel blockade  Negative inotrope, proarrhythmic Dose-dependent QT prolongation  Excessive serotonin activity/valvular damage Excessive serotonin activity/valvular damage Unknown  IgE mediated hypersensitivy, calcium channel blockade  Excessive serotonin activity/valvular damage Excessive serotonin activity/valvular damage Valvular damage  Direct myofibrillar degeneration, adrenergic stimulation  Antimalarials Chloroquine Hydroxychloroquine Intracellular inhibition of lysosomal enzymes  Urologic Agents Alpha Blockers Doxazosin Prazosin Tamsulosin Terazosin  Increased renin and aldosterone  Adapted from Page Carleene Overlie, et al. "Drugs That May Cause or Exacerbate Heart Failure: A Scientific Statement from the American Heart  Association." Circulation 2016; 134:e32-e69. DOI: 10.1161/CIR.0000000000000426   MEDICATION ADHERENCES TIPS AND STRATEGIES Taking medication as prescribed improves patient outcomes in heart failure (reduces hospitalizations, improves symptoms, increases survival) Side effects of medications can be managed by decreasing doses, switching agents, stopping drugs, or adding additional therapy. Please let someone in the [facility] know if you have having bothersome side effects so we can modify your regimen. Do not alter your medication regimen without talking to [place].  Medication reminders can help patients remember to take drugs on time. If you are missing or forgetting doses you can try linking behaviors, using pill boxes, or an electronic reminder like an alarm on your phone or an app. Some people can also get automated phone calls as medication reminders.

## 2022-10-01 ENCOUNTER — Telehealth: Payer: Self-pay | Admitting: Family

## 2022-10-01 NOTE — Telephone Encounter (Signed)
Called patient to review BMP/ lipid panel. Kidney function is stable. LDL is elevated but patient today states that he hasn't been taking his atorvastatin at all although he does have plenty at home. Advised him to start taking the atorvastatin and will recheck lipids in a few months. If LDL remains elevated, consider changing stating to crestor '40mg'$  along with zetia '10mg'$ .

## 2022-10-06 ENCOUNTER — Encounter: Payer: Self-pay | Admitting: Podiatry

## 2022-10-13 ENCOUNTER — Ambulatory Visit: Payer: 59 | Admitting: Surgery

## 2022-10-17 ENCOUNTER — Ambulatory Visit (INDEPENDENT_AMBULATORY_CARE_PROVIDER_SITE_OTHER): Payer: 59 | Admitting: Podiatry

## 2022-10-17 DIAGNOSIS — M79675 Pain in left toe(s): Secondary | ICD-10-CM

## 2022-10-17 DIAGNOSIS — E119 Type 2 diabetes mellitus without complications: Secondary | ICD-10-CM

## 2022-10-17 DIAGNOSIS — Z794 Long term (current) use of insulin: Secondary | ICD-10-CM | POA: Diagnosis not present

## 2022-10-17 DIAGNOSIS — B351 Tinea unguium: Secondary | ICD-10-CM | POA: Diagnosis not present

## 2022-10-17 DIAGNOSIS — M79674 Pain in right toe(s): Secondary | ICD-10-CM

## 2022-10-17 NOTE — Progress Notes (Signed)
  Subjective:  Patient ID: Oscar [name], male    DOB: Oct 17, 1975,  MRN: [id number]  No chief complaint on file.   47 y.o. male presents with the above complaint. History confirmed with patient.  His nails are thickened and elongated and causing discomfort again, says his diabetes is well controlled  Denies tingling numbness or neuropathy symptoms  Objective:  Physical Exam: warm, good capillary refill, no trophic changes or ulcerative lesions, normal DP and PT pulses, and normal sensory exam. Left Foot: dystrophic yellowed discolored nail plates with subungual debris Right Foot: dystrophic yellowed discolored nail plates with subungual debris  Assessment:   No diagnosis found.     Plan:  Patient was evaluated and treated and all questions answered.  Discussed the etiology and treatment options for the condition in detail with the patient. Educated patient on the topical and oral treatment options for mycotic nails. Recommended debridement of the nails today. Sharp and mechanical debridement performed of all painful and mycotic nails today. Nails debrided in length and thickness using a nail nipper to level of comfort. Discussed treatment options including appropriate shoe gear. Follow up as needed for painful nails.    No follow-ups on file.

## 2022-10-18 NOTE — Progress Notes (Unsigned)
Patient ID: Oscar [name], male    DOB: 08/26/1976, 47 y.o.   MRN: [id number]  Oscar [name] is a 47 y/o male with a history of obstructive sleep apnea (currently with CPAP), MI, HTN, hyperlipidemia, GERD, DM, SVT, asthma and chronic heart failure.  Echo report from 11/23/20 reviewed and showed an EF of <20% along with moderate LVH and moderately elevated PA pressure. Echo done 02/27/20 reviewed and showed an EF of <20%. Echo done 03/01/2019 which showed an EF of 15% along with mild Oscar. Echo done 06/23/18 reviewed and showed an EF of 20-25% along with mild Oscar. Echo done 02/06/15 which showed an EF of 25% which is unchanged from previous echo on 12/10/14 which showed an EF of 25% and mild Oscar.   Stress test 03/01/2019: Abnormal myocardial perfusion scan no clear evidence of  reversible ischemia inferior persistent defect either scar versus body  habitus artifact severely dilated left ventricular function severely  depressed LV function with ejection fraction of around 11%.  Recommend  medical therapy   Has not been admitted or been in the ED in the last 6 months.   He presents today for a HF follow-up visit with a chief complaint of minimal fatigue upon moderate exertion. Describes this as chronic in nature. Has associated cough & SOB along with this. Denies any difficulty sleeping, dizziness, abdominal distention, palpitations, pedal edema, chest pain, wheezing or weight gain.   Had echo done earlier today.   Wearing nocturnal ventilator. He says that he sleeps much better since he's been wearing this.   Past Medical History:  Diagnosis Date   AICD (automatic cardioverter/defibrillator) present    Aortic atherosclerosis (HCC)    Asthma    Chronic respiratory failure (HCC)    CKD (chronic kidney disease), stage III (HCC)    COPD (chronic obstructive pulmonary disease) (HCC)    Deafness in right ear    Diabetes mellitus without complication (HCC)    Dyspnea    GERD (gastroesophageal reflux  disease)    Gout    HFrEF (heart failure with reduced ejection fraction) ([place])    a.) TTE 12/10/14: EF 25%; diff inf HK; sev LV and mod LA dil; LVH. b.) TTE 06/23/18: EF 20-25%; LVH; mild LV dil, mild BAE. c.) TTE 03/01/19: EF 15%, LVH, BAE; triv PR/TR. d.) TTE 02/27/20: EF < 20%; sev LV dil; mild Oscar. e.) TTE 11/23/20: EF < 20%; glob HK; sev LV dil; LVH; mild-mod BAE; mod-sev TR, triv AR; G1DD. f.) TTE 05/14/21: EF <15%; LVH; sev LA and mild RV enlar; triv AR/PR, mild TR, mod Oscar.   Hiatal hernia    History of cardiac catheterization    a.) R/LHC 12/14/2009: normal coronaries. b.) R/LHC 12/11/2014: normal coronaries.   Hyperlipidemia    Hypertension    Hypoxemia    LBBB (left bundle branch block)    NICM (nonischemic cardiomyopathy; dilated cardiomyopathy) ([place])    a.) R/LHC 12/14/2009: normal cors; LVEDP 18 mmHg, mean PA 29 mmHg, mean PCWP 31 mmHg; CO 6 L/min; CI 2.54 L/min/m. b.) TTE 12/10/2014: EF 25%. c.) R/LHC 12/11/2014: mean RA 9 mmHg, mean PA 22 mmHg, mean PCWP 22 mmHg. d.) TTE 06/23/2018: EF 20-25%. e.) TTE 03/01/2019: EF 15%. f.) TTE 02/27/2020: < 20%. g.) TTE 11/23/2020: EF <20%. h.) TTE 05/14/2021: < 15%.   NSTEMI (non-ST elevated myocardial infarction) ([place])    a.) x 3 per patient report ---> 2012, 2014, 2016   Obesity    On supplemental oxygen by nasal  cannula    a.) 2-3 L/[place] PRN   OSA on CPAP    PAF (paroxysmal atrial fibrillation) (HCC)    a.) CHA2DS2-VASc = 4 (HFrEF, HTN, prior MI, T2DM). b.) rate/rhythm maintained without pharmacologial intervention; no current anticoagulation.   Pancreatitis    PSVT (paroxysmal supraventricular tachycardia)    Past Surgical History:  Procedure Laterality Date   BIOPSY  11/25/2021   Procedure: BIOPSY;  Surgeon: Rush Landmark Telford Nab., MD;  Location: [facility] ENDOSCOPY;  Service: Gastroenterology;;   CARDIAC CATHETERIZATION  12/11/2014   Procedure: RIGHT/LEFT HEART CATH AND CORONARY ANGIOGRAPHY;  Surgeon: Lorretta Harp, MD;  Location: [facility] CATH  LAB;  Service: Cardiovascular;;   COLONOSCOPY WITH PROPOFOL N/A 11/13/2021   Procedure: COLONOSCOPY WITH PROPOFOL;  Surgeon: Lin Landsman, MD;  Location: [facility] ENDOSCOPY;  Service: Gastroenterology;  Laterality: N/A;   ESOPHAGOGASTRODUODENOSCOPY (EGD) WITH PROPOFOL N/A 11/25/2021   Procedure: ESOPHAGOGASTRODUODENOSCOPY (EGD) WITH PROPOFOL;  Surgeon: Rush Landmark Telford Nab., MD;  Location: [facility] ENDOSCOPY;  Service: Gastroenterology;  Laterality: N/A;   ICD LEAD REMOVAL N/A 03/30/2015   Procedure: ICD LEAD REMOVAL;  Surgeon: Marzetta Board, MD;  Location: [facility] ORS;  Service: Cardiovascular;  Laterality: N/A;   IMPLANTABLE CARDIOVERTER DEFIBRILLATOR IMPLANT     INCISION AND DRAINAGE ABSCESS N/A 12/12/2020   Procedure: INCISION AND DRAINAGE ABSCESS;  Surgeon: Jules Husbands, MD;  Location: [facility] ORS;  Service: General;  Laterality: N/A;   INSERT / REPLACE / [place] N/A 12/09/2014   Procedure: LEFT HEART CATHETERIZATION WITH CORONARY ANGIOGRAM;  Surgeon: Burnell Blanks, MD;  Location: [facility] CATH LAB;  Service: Cardiovascular;  Laterality: N/A;   RIGHT/LEFT HEART CATH AND CORONARY ANGIOGRAPHY Bilateral 12/14/2009   Procedure: RIGHT/LEFT HEART CATH AND CORONARY ANGIOGRAPHY; Location: [place]; Surgeon: Katrine Coho, MD   TONSILLECTOMY     as a child , adnoids removed   UPPER ESOPHAGEAL ENDOSCOPIC ULTRASOUND (EUS) N/A 11/25/2021   Procedure: UPPER ESOPHAGEAL ENDOSCOPIC ULTRASOUND (EUS);  Surgeon: Irving Copas., MD;  Location: [facility] ENDOSCOPY;  Service: Gastroenterology;  Laterality: N/A;   Family History  Problem Relation Age of Onset   Hypertension Mother    Congestive Heart Failure Mother    Hypertension Sister    Diabetes Sister    Pancreatitis Sister    COPD Sister    Emphysema Sister        smoked   Pancreatitis Brother    Anemia Neg Hx    Arrhythmia Neg Hx    Asthma Neg Hx    Clotting disorder Neg Hx     Fainting Neg Hx    Heart attack Neg Hx    Heart disease Neg Hx    Heart failure Neg Hx    Hyperlipidemia Neg Hx    Social History   Tobacco Use   Smoking status: Every Day    Packs/day: 1.50    Years: 34.00    Total pack years: 51.00    Types: Cigarettes    Passive exposure: Past   Smokeless tobacco: Never   Tobacco comments:    1/2 ppd [place], [place] 04/23/22   Substance Use Topics   Alcohol use: No   Allergies  Allergen Reactions   Ciprofloxacin Swelling and Other (See Comments)    Migraine Headache    Iodinated Contrast Media     Other reaction(s): Vomiting Projectile vomiting   Isosorb Dinitrate-Hydralazine Other (See Comments)    Migraine Headache   Prior to Admission medications  Medication Sig Start Date End Date Taking? Authorizing Provider  acetaminophen (TYLENOL) 500 MG tablet Take 1,000 mg by mouth every 6 (six) hours as needed for moderate pain or mild pain.   Yes [provider]  allopurinol (ZYLOPRIM) 300 MG tablet Take 300 mg by mouth daily.   Yes [provider]  atorvastatin (LIPITOR) 40 MG tablet Take 40 mg by mouth daily.   Yes [provider]  clindamycin (CLEOCIN-T) 1 % lotion Apply to affected areas daily after shower. 06/11/21  Yes [name], MD  colchicine 0.6 MG tablet Take 0.6 mg by mouth as needed (gout). 11/04/21  Yes [provider]  JARDIANCE 10 MG TABS tablet Take 10 mg by mouth daily. 06/25/22  Yes [provider]  ketoconazole (NIZORAL) 2 % cream Apply twice daily to neck until clear 06/11/21  Yes [name], MD  NON FORMULARY Pt uses a cpap nightly   Yes [provider]  Omeprazole 20 MG TBDD Take 20 mg by mouth daily.   Yes [provider]  sacubitril-valsartan (ENTRESTO) 49-51 MG Take 0.5 tablets by mouth 2 (two) times daily.   Yes [provider]  tazarotene (AVAGE) 0.1 % cream Apply topically at bedtime. 06/11/21  Yes [name], MD  torsemide  (DEMADEX) 20 MG tablet Take 40 mg by mouth daily.   Yes [provider]  umeclidinium-vilanterol (ANORO ELLIPTA) 62.5-25 MCG/ACT AEPB Inhale 1 puff into the lungs daily. 04/23/22  Yes Tyler Pita, MD  VENTOLIN HFA 108 [phone] Base) MCG/ACT inhaler Inhale 2 puffs into the lungs every 6 (six) hours as needed for shortness of breath. 11/23/20  Yes [provider]    Review of Systems  Constitutional:  Positive for fatigue. Negative for appetite change.  HENT:  Negative for congestion, postnasal drip and sore throat.   Eyes: Negative.   Respiratory:  Positive for cough and shortness of breath. Negative for chest tightness and wheezing.   Cardiovascular:  Negative for chest pain, palpitations and leg swelling.  Gastrointestinal:  Negative for abdominal distention, abdominal pain, diarrhea and nausea.  Endocrine: Negative.   Genitourinary: Negative.   Musculoskeletal:  Negative for arthralgias and back pain.  Skin: Negative.   Allergic/Immunologic: Negative.   Neurological:  Negative for dizziness, light-headedness and headaches.  Hematological:  Negative for adenopathy. Does not bruise/bleed easily.  Psychiatric/Behavioral:  Negative for dysphoric mood and sleep disturbance (sleeping on 4 pillows with nocturnal ventilator/ oxygen). The patient is not nervous/anxious.    Vitals:   10/20/22 1217  BP: 101/79  Pulse: 92  Resp: 20  SpO2: 100%  Weight: 292 lb 6 oz (132.6 kg)   Wt Readings from Last 3 Encounters:  10/20/22 292 lb 6 oz (132.6 kg)  09/30/22 294 lb 8 oz (133.6 kg)  09/23/22 292 lb (132.5 kg)   Lab Results  Component Value Date   CREATININE 1.63 (H) 09/30/2022   CREATININE 1.66 (H) 09/23/2022   CREATININE 1.75 (H) 12/25/2021   Physical Exam Vitals and nursing note reviewed.  Constitutional:      Appearance: He is well-developed.  HENT:     Head: Normocephalic and atraumatic.  Neck:     Vascular: No JVD.  Cardiovascular:     Rate and Rhythm: Normal  rate and regular rhythm.  Pulmonary:     Effort: Pulmonary effort is normal.     Breath sounds: No wheezing or rales.  Abdominal:     General: There is no distension.     Palpations:  Abdomen is soft.     Tenderness: There is no abdominal tenderness.  Musculoskeletal:        General: No tenderness.     Cervical back: Normal range of motion and neck supple.     Right lower leg: No edema.     Left lower leg: No edema.  Skin:    General: Skin is warm and dry.  Neurological:     Mental Status: He is alert and oriented to person, place, and time.  Psychiatric:        Behavior: Behavior normal.        Thought Content: Thought content normal.     Assessment & Plan:  1: Chronic heart failure with reduced ejection fraction- - NYHA class II - euvolemic - weighing daily; Reminded to call for an overnight weight gain of >2 pounds or a weekly weight gain of >5 pounds.  - weight down 2 pounds from last visit here 3 weeks ago - AICD (2016) in place but denies any shocks "in a long time" - jardiance 73m daily - entresto 24/236mBID - add metoprolol 2556maily - in the past, spironolactone has caused hypotension - torsemide 74m55mD - echo done earlier today - stress test done 03/01/2019 - refer to [facility] MD for further work-up - BNP 12/26/20 was 136.9 - he does not take the flu vaccine  2: HTN with CKD- - BP 101/79 - sees PCP (ManLadoris Gene CharMorricem 09/30/22 reviewed and showed sodium 138, potassium 3.7, Cr 1.63 and GFR 52 - saw nephrology (Korrapati) 10/07/22  3: Obstructive sleep apnea- - wears nocturnal ventilator QHS, 8 hours/night; reports sleeping better - appears to be benefitting from this therapy - continues wearing oxygen at 3L at bedtime & PRN during the day - saw pulmonology (GonPatsey Berthold23/23  4: Diabetes- - A1c from 07/30/22 was 7.4% - home glucose this morning was 148  5: Tobacco use- - smokes 1/2 ppd of cigarettes - complete  cessation discussed for 3 minutes  6: Atrial fibrillation- - RRR in office today - saw cardiology (WhiDema Severin1/23   Patient did not bring his medications nor a list. Each medication was verbally reviewed with the patient and he was encouraged to bring the bottles to every visit to confirm accuracy of list.   Return in 2 weeks, sooner if needed

## 2022-10-20 ENCOUNTER — Ambulatory Visit (HOSPITAL_BASED_OUTPATIENT_CLINIC_OR_DEPARTMENT_OTHER): Payer: 59 | Admitting: Family

## 2022-10-20 ENCOUNTER — Encounter: Payer: Self-pay | Admitting: Family

## 2022-10-20 ENCOUNTER — Ambulatory Visit
Admission: RE | Admit: 2022-10-20 | Discharge: 2022-10-20 | Disposition: A | Discharge status: Home / Self Care | Payer: 59 | Source: Ambulatory Visit | Attending: Family | Admitting: Family

## 2022-10-20 VITALS — BP 101/79 | HR 92 | Resp 20 | Wt 292.4 lb

## 2022-10-20 DIAGNOSIS — I4891 Unspecified atrial fibrillation: Secondary | ICD-10-CM | POA: Diagnosis not present

## 2022-10-20 DIAGNOSIS — I5022 Chronic systolic (congestive) heart failure: Secondary | ICD-10-CM | POA: Diagnosis present

## 2022-10-20 DIAGNOSIS — G4733 Obstructive sleep apnea (adult) (pediatric): Secondary | ICD-10-CM

## 2022-10-20 DIAGNOSIS — I252 Old myocardial infarction: Secondary | ICD-10-CM | POA: Insufficient documentation

## 2022-10-20 DIAGNOSIS — Z79899 Other long term (current) drug therapy: Secondary | ICD-10-CM | POA: Insufficient documentation

## 2022-10-20 DIAGNOSIS — Z794 Long term (current) use of insulin: Secondary | ICD-10-CM

## 2022-10-20 DIAGNOSIS — K219 Gastro-esophageal reflux disease without esophagitis: Secondary | ICD-10-CM | POA: Diagnosis not present

## 2022-10-20 DIAGNOSIS — E119 Type 2 diabetes mellitus without complications: Secondary | ICD-10-CM

## 2022-10-20 DIAGNOSIS — E785 Hyperlipidemia, unspecified: Secondary | ICD-10-CM | POA: Diagnosis not present

## 2022-10-20 DIAGNOSIS — I10 Essential (primary) hypertension: Secondary | ICD-10-CM

## 2022-10-20 DIAGNOSIS — N183 Chronic kidney disease, stage 3 unspecified: Secondary | ICD-10-CM | POA: Diagnosis not present

## 2022-10-20 DIAGNOSIS — E1122 Type 2 diabetes mellitus with diabetic chronic kidney disease: Secondary | ICD-10-CM | POA: Insufficient documentation

## 2022-10-20 DIAGNOSIS — F17210 Nicotine dependence, cigarettes, uncomplicated: Secondary | ICD-10-CM | POA: Diagnosis not present

## 2022-10-20 DIAGNOSIS — I080 Rheumatic disorders of both mitral and aortic valves: Secondary | ICD-10-CM | POA: Diagnosis not present

## 2022-10-20 DIAGNOSIS — Z95810 Presence of automatic (implantable) cardiac defibrillator: Secondary | ICD-10-CM | POA: Insufficient documentation

## 2022-10-20 DIAGNOSIS — J449 Chronic obstructive pulmonary disease, unspecified: Secondary | ICD-10-CM | POA: Insufficient documentation

## 2022-10-20 DIAGNOSIS — I5023 Acute on chronic systolic (congestive) heart failure: Secondary | ICD-10-CM

## 2022-10-20 DIAGNOSIS — I130 Hypertensive heart and chronic kidney disease with heart failure and stage 1 through stage 4 chronic kidney disease, or unspecified chronic kidney disease: Secondary | ICD-10-CM | POA: Diagnosis not present

## 2022-10-20 DIAGNOSIS — Z720 Tobacco use: Secondary | ICD-10-CM

## 2022-10-20 DIAGNOSIS — Z7984 Long term (current) use of oral hypoglycemic drugs: Secondary | ICD-10-CM | POA: Insufficient documentation

## 2022-10-20 DIAGNOSIS — I480 Paroxysmal atrial fibrillation: Secondary | ICD-10-CM

## 2022-10-20 DIAGNOSIS — Z8249 Family history of ischemic heart disease and other diseases of the circulatory system: Secondary | ICD-10-CM | POA: Insufficient documentation

## 2022-10-20 LAB — ECHOCARDIOGRAM COMPLETE
AR max vel: 4.33 cm2
AV Area VTI: 4.27 cm2
AV Area mean vel: 4.02 cm2
AV Mean grad: 2 mmHg
AV Peak grad: 3.2 mmHg
Ao pk vel: 0.89 m/s
Area-P 1/2: 4.31 cm2
Calc EF: 16.5 %
Est EF: <20
MV VTI: 3.25 cm2
S' Lateral: 7.5 cm
Single Plane A2C EF: 16.8 %
Single Plane A4C EF: 21 %

## 2022-10-20 MED ORDER — METOPROLOL SUCCINATE ER 25 MG PO TB24: 25.0000 mg | ORAL_TABLET | Freq: Every day | ORAL | 5 refills | Status: DC

## 2022-10-20 NOTE — Patient Instructions (Addendum)
Continue weighing daily and call for an overnight weight gain of 3 pounds or more or a weekly weight gain of more than 5 pounds.  If you have voicemail, please make sure your mailbox is cleaned out so that we may leave a message and please make sure to listen to any voicemails.    Start taking metoprolol as 1 tablet every day

## 2022-10-20 NOTE — Progress Notes (Signed)
*  PRELIMINARY RESULTS* Echocardiogram 2D Echocardiogram has been performed.  Oscar [name] 10/20/2022, 12:26 PM

## 2022-10-22 ENCOUNTER — Ambulatory Visit: Payer: 59 | Admitting: Surgery

## 2022-11-03 ENCOUNTER — Encounter: Payer: 59 | Admitting: Family

## 2022-11-10 ENCOUNTER — Other Ambulatory Visit: Payer: Self-pay

## 2022-11-10 ENCOUNTER — Ambulatory Visit
Admission: RE | Admit: 2022-11-10 | Discharge: 2022-11-10 | Disposition: A | Discharge status: Home / Self Care | Payer: 59 | Source: Ambulatory Visit | Attending: Surgery | Admitting: Surgery

## 2022-11-10 ENCOUNTER — Encounter: Payer: Self-pay | Admitting: Surgery

## 2022-11-10 ENCOUNTER — Other Ambulatory Visit
Admission: RE | Admit: 2022-11-10 | Discharge: 2022-11-10 | Disposition: A | Discharge status: Home / Self Care | Payer: 59 | Source: Ambulatory Visit | Attending: Surgery | Admitting: Surgery

## 2022-11-10 ENCOUNTER — Ambulatory Visit (INDEPENDENT_AMBULATORY_CARE_PROVIDER_SITE_OTHER): Payer: 59 | Admitting: Surgery

## 2022-11-10 VITALS — BP 103/79 | HR 89 | Temp 98.8°F | Ht 66.0 in | Wt 281.0 lb

## 2022-11-10 DIAGNOSIS — I509 Heart failure, unspecified: Secondary | ICD-10-CM | POA: Diagnosis present

## 2022-11-10 DIAGNOSIS — Z8719 Personal history of other diseases of the digestive system: Secondary | ICD-10-CM

## 2022-11-10 DIAGNOSIS — R1084 Generalized abdominal pain: Secondary | ICD-10-CM | POA: Diagnosis not present

## 2022-11-10 DIAGNOSIS — R1010 Upper abdominal pain, unspecified: Secondary | ICD-10-CM

## 2022-11-10 DIAGNOSIS — R1011 Right upper quadrant pain: Secondary | ICD-10-CM | POA: Diagnosis present

## 2022-11-10 DIAGNOSIS — E1169 Type 2 diabetes mellitus with other specified complication: Secondary | ICD-10-CM

## 2022-11-10 DIAGNOSIS — K859 Acute pancreatitis without necrosis or infection, unspecified: Secondary | ICD-10-CM

## 2022-11-10 DIAGNOSIS — G8929 Other chronic pain: Secondary | ICD-10-CM

## 2022-11-10 LAB — COMPREHENSIVE METABOLIC PANEL
ALT: 35 U/L (ref 0–44)
AST: 44 U/L — ABNORMAL HIGH (ref 15–41)
Albumin: 4 g/dL (ref 3.5–5.0)
Alkaline Phosphatase: 169 U/L — ABNORMAL HIGH (ref 38–126)
Anion gap: 8 (ref 5–15)
BUN: 40 mg/dL — ABNORMAL HIGH (ref 6–20)
CO2: 29 mmol/L (ref 22–32)
Calcium: 9.3 mg/dL (ref 8.9–10.3)
Chloride: 101 mmol/L (ref 98–111)
Creatinine, Ser: 1.8 mg/dL — ABNORMAL HIGH (ref 0.61–1.24)
GFR, Estimated: 46 mL/min — ABNORMAL LOW (ref >60)
Glucose, Bld: 128 mg/dL — ABNORMAL HIGH (ref 70–99)
Potassium: 3.6 mmol/L (ref 3.5–5.1)
Sodium: 138 mmol/L (ref 135–145)
Total Bilirubin: 1 mg/dL (ref 0.3–1.2)
Total Protein: 8.4 g/dL — ABNORMAL HIGH (ref 6.5–8.1)

## 2022-11-10 LAB — CBC WITH DIFFERENTIAL/PLATELET
Abs Immature Granulocytes: 0.01 10*3/uL (ref 0.00–0.07)
Basophils Absolute: 0 10*3/uL (ref 0.0–0.1)
Basophils Relative: 1 %
Eosinophils Absolute: 0.1 10*3/uL (ref 0.0–0.5)
Eosinophils Relative: 4 %
HCT: 46.3 % (ref 39.0–52.0)
Hemoglobin: 16.1 g/dL (ref 13.0–17.0)
Immature Granulocytes: 0 %
Lymphocytes Relative: 41 %
Lymphs Abs: 1.5 10*3/uL (ref 0.7–4.0)
MCH: 32.9 pg (ref 26.0–34.0)
MCHC: 34.8 g/dL (ref 30.0–36.0)
MCV: 94.5 fL (ref 80.0–100.0)
Monocytes Absolute: 0.3 10*3/uL (ref 0.1–1.0)
Monocytes Relative: 7 %
Neutro Abs: 1.7 10*3/uL (ref 1.7–7.7)
Neutrophils Relative %: 47 %
Platelets: 145 10*3/uL — ABNORMAL LOW (ref 150–400)
RBC: 4.9 MIL/uL (ref 4.22–5.81)
RDW: 12.4 % (ref 11.5–15.5)
WBC: 3.7 10*3/uL — ABNORMAL LOW (ref 4.0–10.5)
nRBC: 0 % (ref 0.0–0.2)

## 2022-11-12 ENCOUNTER — Telehealth: Payer: Self-pay

## 2022-11-12 NOTE — Telephone Encounter (Signed)
Notified patient as instructed, patient pleased. Discussed follow-up appointments, patient agrees  

## 2022-11-12 NOTE — Progress Notes (Signed)
Outpatient Surgical Follow Up  11/12/2022  Oscar [name] is an 47 y.o. male.   Chief Complaint  Patient presents with   Routine Post Op    Pain RUQ s/p cholecystectomy    HPI: Oscar [name] s/p chole AND UH.  Does have multiple medical issues to include heart failure, COPD, diabetes.  Send now with several week history of upper abdominal pain.  No fevers no chills. Has associated cough & SOB along with this . 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 b.) TTE 06/23/18: EF 20-25%; LVH; mild LV dil, mild BAE. c.) TTE 03/01/19: EF 15%, LVH, BAE; triv PR/TR. d.) TTE 02/27/20: EF < 20%; sev LV dil; mild MR. e.) TTE 11/23/20: EF < 20%; glob HK; sev LV dil; LVH; mild-mod BAE; mod-sev TR, triv AR; G1DD. f.) TTE 05/14/21: EF <15%; LVH; sev LA and mild RV enlar; triv AR/PR, mild TR, mod MR.   Hiatal hernia    History of cardiac catheterization    a.) R/LHC 12/14/2009: normal coronaries. b.) R/LHC 12/11/2014: normal coronaries.   Hyperlipidemia    Hypertension    Hypoxemia    LBBB (left bundle branch block)    NICM (nonischemic cardiomyopathy; dilated cardiomyopathy) ([place])    a.) R/LHC 12/14/2009: normal cors; LVEDP 18 mmHg, mean PA 29 mmHg, mean PCWP 31 mmHg; CO 6 L/min; CI 2.54 L/min/m. b.) TTE 12/10/2014: EF 25%. c.) R/LHC 12/11/2014: mean RA 9 mmHg, mean PA 22 mmHg, mean PCWP 22 mmHg. d.) TTE 06/23/2018: EF 20-25%. e.) TTE  03/01/2019: EF 15%. f.) TTE 02/27/2020: < 20%. g.) TTE 11/23/2020: EF <20%. h.) TTE 05/14/2021: < 15%.   NSTEMI (non-ST elevated myocardial infarction) ([place])    a.) x 3 per patient report ---> 2012, 2014, 2016   Obesity    On supplemental oxygen by nasal cannula    a.) 2-3 L/[place] PRN   OSA on CPAP    PAF (paroxysmal atrial fibrillation) ([place])    a.) CHA2DS2-VASc = 4 (HFrEF, HTN, prior MI, T2DM). b.) rate/rhythm maintained without pharmacologial intervention; no current anticoagulation.   Pancreatitis    PSVT (paroxysmal supraventricular tachycardia)     Past Surgical History:  Procedure Laterality Date   BIOPSY  11/25/2021   Procedure: BIOPSY;  Surgeon: Rush Landmark Telford Nab., MD;  Location: [facility] ENDOSCOPY;  Service: Gastroenterology;;   CARDIAC CATHETERIZATION  12/11/2014   Procedure: RIGHT/LEFT HEART CATH AND CORONARY ANGIOGRAPHY;  Surgeon: Lorretta Harp, MD;  Location: [facility] CATH LAB;  Service: Cardiovascular;;   COLONOSCOPY WITH PROPOFOL N/A 11/13/2021   Procedure: COLONOSCOPY WITH PROPOFOL;  Surgeon: Lin Landsman, MD;  Location: [facility] ENDOSCOPY;  Service: Gastroenterology;  Laterality: N/A;   ESOPHAGOGASTRODUODENOSCOPY (EGD) WITH PROPOFOL N/A 11/25/2021   Procedure: ESOPHAGOGASTRODUODENOSCOPY (EGD) WITH PROPOFOL;  Surgeon: Rush Landmark Telford Nab., MD;  Location: [facility] ENDOSCOPY;  Service: Gastroenterology;  Laterality: N/A;   ICD LEAD REMOVAL N/A 03/30/2015   Procedure: ICD LEAD REMOVAL;  Surgeon: Marzetta Board, MD;  Location: [facility] ORS;  Service: Cardiovascular;  Laterality: N/A;   IMPLANTABLE CARDIOVERTER DEFIBRILLATOR IMPLANT     INCISION AND DRAINAGE ABSCESS N/A 12/12/2020   Procedure: INCISION AND DRAINAGE ABSCESS;  Surgeon: Jules Husbands, MD;  Location: [facility] ORS;  Service: General;  Laterality: N/A;   INSERT / REPLACE / [place] N/A 12/09/2014   Procedure: LEFT HEART CATHETERIZATION WITH CORONARY ANGIOGRAM;   Surgeon: Burnell Blanks, MD;  Location: [facility] CATH LAB;  Service: Cardiovascular;  Laterality: N/A;   RIGHT/LEFT HEART CATH AND CORONARY ANGIOGRAPHY Bilateral 12/14/2009   Procedure: RIGHT/LEFT HEART CATH AND CORONARY ANGIOGRAPHY; Location: [place]; Surgeon: Katrine Coho, MD   TONSILLECTOMY     as a child , adnoids removed   UPPER ESOPHAGEAL ENDOSCOPIC ULTRASOUND (EUS) N/A 11/25/2021   Procedure: UPPER ESOPHAGEAL ENDOSCOPIC ULTRASOUND (EUS);  Surgeon: Irving Copas., MD;  Location: [facility] ENDOS[facility];  Service: Gastroenterology;  Laterality: N/A;    Family History  Problem Relation Age of Onset   Hypertension Mother    Congestive Heart Failure Mother    Hypertension Sister    Diabetes Sister    Pancreatitis Sister    COPD Sister    Emphysema Sister        smoked   Pancreatitis Brother    Anemia Neg Hx    Arrhythmia Neg Hx    Asthma Neg Hx    Clotting disorder Neg Hx    Fainting Neg Hx    Heart attack Neg Hx    Heart disease Neg Hx    Heart failure Neg Hx    Hyperlipidemia Neg Hx     Social History:  reports that he has been smoking cigarettes. He has a 51.00 pack-year smoking history. He has been exposed to tobacco smoke. He has never used smokeless tobacco. He reports that he does not drink alcohol and does not use drugs.  Allergies:  Allergies  Allergen Reactions   Ciprofloxacin Swelling and Other (See Comments)    Migraine Headache    Iodinated Contrast Media     Other reaction(s): Vomiting Projectile vomiting   Isosorb Dinitrate-Hydralazine Other (See Comments)    Migraine Headache    Medications reviewed.    ROS Full ROS performed and is otherwise negative other than what is stated in HPI   BP 103/79   Pulse 89   Temp 98.8 F (37.1 C) (Oral)   Ht '5\' 6"'$  (1.676 m)   Wt 281 lb (127.5 kg)   SpO2 94%   BMI 45.35 kg/m   Physical Exam Vitals and nursing note reviewed. Exam conducted with a chaperone present.  Constitutional:      General: He  is not in acute distress.    Appearance: Normal appearance. He is obese.  Cardiovascular:     Rate and Rhythm: Normal rate and regular rhythm.     Heart sounds: Murmur heard.  Pulmonary:     Effort: Pulmonary effort is normal.     Breath sounds: No stridor. No rhonchi.  Abdominal:     General: Abdomen is flat. There is no distension.     Palpations: Abdomen is soft. There is no mass.     Tenderness: There is no abdominal tenderness. There is no guarding or rebound.     Hernia: No hernia is present.     Comments: Robotic  incisions healed.  No evidence of infection no evidence of hernia recurrence  Musculoskeletal:        General: Normal range of motion.     Cervical back: Normal range of motion and neck supple. No rigidity or tenderness.  Lymphadenopathy:     Cervical: No cervical adenopathy.  Skin:    General: Skin is warm and dry.     Capillary Refill: Capillary refill takes less than 2 seconds.  Neurological:     General: No focal deficit present.     Mental Status: He is alert and oriented to person, place, and time.  Psychiatric:        Mood and Affect: Mood normal.        Behavior: Behavior normal.        Thought Content: Thought content normal.        Judgment: Judgment normal.    Assessment/Plan: 47 year old male with upper abdominal pain.  He did have a history of cholecystectomy and umbilical hernia. Differential will include pancreatitis , colitis gastritis From my standpoint I do think that this is all likely related to complications from the hernia or gallbladder.  We will make sure that the pancreas is good since he had a prior history of pancreatitis.  CT scan of the abdomen pelvis will be ordered.  No need For hospitalization or emergent surgical intervention at this time Note that I spent more than 40 minutes in this encounter including personally reviewing imaging studies, coordination of his care, placing orders and performing appropriate documentation   Caroleen Hamman, MD [place] Surgeon

## 2022-11-12 NOTE — Telephone Encounter (Signed)
-----   Message from Jules Husbands, MD sent at 11/12/2022  3:58 PM EDT ----- Please let him knwo CT scan did not show any major surprises, He has chronic pancreatitis ( not new) nothing related to recent GB surgery ----- Message ----- From: Interface, Rad Results In Sent: 11/12/2022   3:30 AM EDT To: Jules Husbands, MD

## 2022-11-14 ENCOUNTER — Encounter: Payer: Self-pay | Admitting: Family

## 2022-11-14 ENCOUNTER — Ambulatory Visit: Payer: 59 | Attending: Family | Admitting: Family

## 2022-11-14 VITALS — BP 94/66 | HR 88 | Wt 284.4 lb

## 2022-11-14 DIAGNOSIS — E119 Type 2 diabetes mellitus without complications: Secondary | ICD-10-CM

## 2022-11-14 DIAGNOSIS — I5022 Chronic systolic (congestive) heart failure: Secondary | ICD-10-CM

## 2022-11-14 DIAGNOSIS — E1122 Type 2 diabetes mellitus with diabetic chronic kidney disease: Secondary | ICD-10-CM | POA: Insufficient documentation

## 2022-11-14 DIAGNOSIS — I252 Old myocardial infarction: Secondary | ICD-10-CM | POA: Diagnosis not present

## 2022-11-14 DIAGNOSIS — I130 Hypertensive heart and chronic kidney disease with heart failure and stage 1 through stage 4 chronic kidney disease, or unspecified chronic kidney disease: Secondary | ICD-10-CM | POA: Diagnosis not present

## 2022-11-14 DIAGNOSIS — Z95810 Presence of automatic (implantable) cardiac defibrillator: Secondary | ICD-10-CM | POA: Insufficient documentation

## 2022-11-14 DIAGNOSIS — N183 Chronic kidney disease, stage 3 unspecified: Secondary | ICD-10-CM | POA: Diagnosis not present

## 2022-11-14 DIAGNOSIS — I480 Paroxysmal atrial fibrillation: Secondary | ICD-10-CM | POA: Diagnosis not present

## 2022-11-14 DIAGNOSIS — Z720 Tobacco use: Secondary | ICD-10-CM

## 2022-11-14 DIAGNOSIS — G4733 Obstructive sleep apnea (adult) (pediatric): Secondary | ICD-10-CM

## 2022-11-14 DIAGNOSIS — J45909 Unspecified asthma, uncomplicated: Secondary | ICD-10-CM | POA: Diagnosis not present

## 2022-11-14 DIAGNOSIS — Z716 Tobacco abuse counseling: Secondary | ICD-10-CM | POA: Diagnosis not present

## 2022-11-14 DIAGNOSIS — Z7984 Long term (current) use of oral hypoglycemic drugs: Secondary | ICD-10-CM | POA: Insufficient documentation

## 2022-11-14 DIAGNOSIS — Z79899 Other long term (current) drug therapy: Secondary | ICD-10-CM | POA: Diagnosis not present

## 2022-11-14 DIAGNOSIS — F17210 Nicotine dependence, cigarettes, uncomplicated: Secondary | ICD-10-CM | POA: Insufficient documentation

## 2022-11-14 DIAGNOSIS — K219 Gastro-esophageal reflux disease without esophagitis: Secondary | ICD-10-CM | POA: Diagnosis not present

## 2022-11-14 DIAGNOSIS — I10 Essential (primary) hypertension: Secondary | ICD-10-CM

## 2022-11-14 DIAGNOSIS — Z794 Long term (current) use of insulin: Secondary | ICD-10-CM

## 2022-11-14 NOTE — Progress Notes (Signed)
Patient ID: Oscar [name], male    DOB: 12-Nov-1975, 47 y.o.   MRN: [id number]  Mr Mittelman is a 47 y/o male with a history of obstructive sleep apnea (currently with CPAP), MI, HTN, hyperlipidemia, GERD, DM, SVT, asthma and chronic heart failure.  Echo 10/20/22: EF <20% along with mildly elevated PA pressure and mild MR. Echo 11/23/20: EF of <20% along with moderate LVH and moderately elevated PA pressure. Echo 02/27/20: EF of <20%. Echo 03/01/2019: EF of 15% along with mild MR. Echo 06/23/18: EF of 20-25% along with mild MR. Echo 02/06/15: EF of 25% which is unchanged from previous echo on 12/10/14 which showed an EF of 25% and mild MR.   Stress test 03/01/2019: Abnormal myocardial perfusion scan no clear evidence of  reversible ischemia inferior persistent defect either scar versus body  habitus artifact severely dilated left ventricular function severely  depressed LV function with ejection fraction of around 11%.  Recommend  medical therapy   Has not been admitted or been in the ED in the last 6 months.   He presents today for a HF follow-up visit with a chief complaint of minimal fatigue with moderate exertion. Chronic in nature. Has associated cough, abdominal pain and decreased appetite. Denies any difficulty sleeping, dizziness, abdominal distention, palpitations, pedal edema, chest pain, wheezing, SOB or weight gain.   Says that he's had recent flare of his pancreatitis but can't pin point what would have triggered this. Hasn't been eating much due to the pain. Had recent abdominal/ pelvic CT done.   Past Medical History:  Diagnosis Date   AICD (automatic cardioverter/defibrillator) present    Aortic atherosclerosis (HCC)    Asthma    Chronic respiratory failure (HCC)    CKD (chronic kidney disease), stage III (HCC)    COPD (chronic obstructive pulmonary disease) (HCC)    Deafness in right ear    Diabetes mellitus without complication (HCC)    Dyspnea    GERD (gastroesophageal  reflux disease)    Gout    HFrEF (heart failure with reduced ejection fraction) ([place])    a.) TTE 12/10/14: EF 25%; diff inf HK; sev LV and mod LA dil; LVH. b.) TTE 06/23/18: EF 20-25%; LVH; mild LV dil, mild BAE. c.) TTE 03/01/19: EF 15%, LVH, BAE; triv PR/TR. d.) TTE 02/27/20: EF < 20%; sev LV dil; mild MR. e.) TTE 11/23/20: EF < 20%; glob HK; sev LV dil; LVH; mild-mod BAE; mod-sev TR, triv AR; G1DD. f.) TTE 05/14/21: EF <15%; LVH; sev LA and mild RV enlar; triv AR/PR, mild TR, mod MR.   Hiatal hernia    History of cardiac catheterization    a.) R/LHC 12/14/2009: normal coronaries. b.) R/LHC 12/11/2014: normal coronaries.   Hyperlipidemia    Hypertension    Hypoxemia    LBBB (left bundle branch block)    NICM (nonischemic cardiomyopathy; dilated cardiomyopathy) ([place])    a.) R/LHC 12/14/2009: normal cors; LVEDP 18 mmHg, mean PA 29 mmHg, mean PCWP 31 mmHg; CO 6 L/min; CI 2.54 L/min/m. b.) TTE 12/10/2014: EF 25%. c.) R/LHC 12/11/2014: mean RA 9 mmHg, mean PA 22 mmHg, mean PCWP 22 mmHg. d.) TTE 06/23/2018: EF 20-25%. e.) TTE 03/01/2019: EF 15%. f.) TTE 02/27/2020: < 20%. g.) TTE 11/23/2020: EF <20%. h.) TTE 05/14/2021: < 15%.   NSTEMI (non-ST elevated myocardial infarction) ([place])    a.) x 3 per patient report ---> 2012, 2014, 2016   Obesity    On supplemental oxygen by nasal cannula  a.) 2-3 L/[place] PRN   OSA on CPAP    PAF (paroxysmal atrial fibrillation) (HCC)    a.) CHA2DS2-VASc = 4 (HFrEF, HTN, prior MI, T2DM). b.) rate/rhythm maintained without pharmacologial intervention; no current anticoagulation.   Pancreatitis    PSVT (paroxysmal supraventricular tachycardia)    Past Surgical History:  Procedure Laterality Date   BIOPSY  11/25/2021   Procedure: BIOPSY;  Surgeon: Rush Landmark Telford Nab., MD;  Location: [facility];  Service: Gastroenterology;;   CARDIAC CATHETERIZATION  12/11/2014   Procedure: RIGHT/LEFT HEART CATH AND CORONARY ANGIOGRAPHY;  Surgeon: Lorretta Harp, MD;  Location: [facility]  CATH LAB;  Service: Cardiovascular;;   COLONOSCOPY WITH PROPOFOL N/A 11/13/2021   Procedure: COLONOSCOPY WITH PROPOFOL;  Surgeon: Lin Landsman, MD;  Location: [facility] ENDOSCOPY;  Service: Gastroenterology;  Laterality: N/A;   ESOPHAGOGASTRODUODENOSCOPY (EGD) WITH PROPOFOL N/A 11/25/2021   Procedure: ESOPHAGOGASTRODUODENOSCOPY (EGD) WITH PROPOFOL;  Surgeon: Rush Landmark Telford Nab., MD;  Location: [facility] ENDOSCOPY;  Service: Gastroenterology;  Laterality: N/A;   ICD LEAD REMOVAL N/A 03/30/2015   Procedure: ICD LEAD REMOVAL;  Surgeon: Marzetta Board, MD;  Location: [facility] ORS;  Service: Cardiovascular;  Laterality: N/A;   IMPLANTABLE CARDIOVERTER DEFIBRILLATOR IMPLANT     INCISION AND DRAINAGE ABSCESS N/A 12/12/2020   Procedure: INCISION AND DRAINAGE ABSCESS;  Surgeon: Jules Husbands, MD;  Location: [facility] ORS;  Service: General;  Laterality: N/A;   INSERT / REPLACE / [place] N/A 12/09/2014   Procedure: LEFT HEART CATHETERIZATION WITH CORONARY ANGIOGRAM;  Surgeon: Burnell Blanks, MD;  Location: [facility] CATH LAB;  Service: Cardiovascular;  Laterality: N/A;   RIGHT/LEFT HEART CATH AND CORONARY ANGIOGRAPHY Bilateral 12/14/2009   Procedure: RIGHT/LEFT HEART CATH AND CORONARY ANGIOGRAPHY; Location: [place]; Surgeon: Katrine Coho, MD   TONSILLECTOMY     as a child , adnoids removed   UPPER ESOPHAGEAL ENDOSCOPIC ULTRASOUND (EUS) N/A 11/25/2021   Procedure: UPPER ESOPHAGEAL ENDOSCOPIC ULTRASOUND (EUS);  Surgeon: Irving Copas., MD;  Location: [facility];  Service: Gastroenterology;  Laterality: N/A;   Family History  Problem Relation Age of Onset   Hypertension Mother    Congestive Heart Failure Mother    Hypertension Sister    Diabetes Sister    Pancreatitis Sister    COPD Sister    Emphysema Sister        smoked   Pancreatitis Brother    Anemia Neg Hx    Arrhythmia Neg Hx    Asthma Neg Hx    Clotting disorder Neg Hx     Fainting Neg Hx    Heart attack Neg Hx    Heart disease Neg Hx    Heart failure Neg Hx    Hyperlipidemia Neg Hx    Social History   Tobacco Use   Smoking status: Every Day    Packs/day: 1.50    Years: 34.00    Additional pack years: 0.00    Total pack years: 51.00    Types: Cigarettes    Passive exposure: Past   Smokeless tobacco: Never   Tobacco comments:    1/2 ppd [place], [place] 04/23/22   Substance Use Topics   Alcohol use: No   Allergies  Allergen Reactions   Ciprofloxacin Swelling and Other (See Comments)    Migraine Headache    Iodinated Contrast Media     Other reaction(s): Vomiting Projectile vomiting   Isosorb Dinitrate-Hydralazine Other (See Comments)    Migraine Headache   Prior to  Admission medications   Medication Sig Start Date End Date Taking? Authorizing Provider  acetaminophen (TYLENOL) 500 MG tablet Take 1,000 mg by mouth every 6 (six) hours as needed for moderate pain or mild pain.   Yes [provider]  allopurinol (ZYLOPRIM) 100 MG tablet Take by mouth.   Yes [provider]  atorvastatin (LIPITOR) 40 MG tablet Take 40 mg by mouth daily.   Yes [provider]  clindamycin (CLEOCIN-T) 1 % lotion Apply to affected areas daily after shower. 06/11/21  Yes [name], MD  colchicine 0.6 MG tablet Take 0.6 mg by mouth as needed (gout). 11/04/21  Yes [provider]  JARDIANCE 10 MG TABS tablet Take 10 mg by mouth daily. 06/25/22  Yes [provider]  ketoconazole (NIZORAL) 2 % cream Apply twice daily to neck until clear 06/11/21  Yes [name], MD  metoprolol succinate (TOPROL XL) 25 MG 24 hr tablet Take 1 tablet (25 mg total) by mouth daily. 10/20/22  Yes Charletha Dalpe, Otila Kluver A, FNP  mupirocin ointment (BACTROBAN) 2 % Apply topically 3 (three) times daily. 08/13/22  Yes [provider]  omeprazole (PRILOSEC) 20 MG capsule Take 20 mg by mouth daily. 07/09/22  Yes [provider]   sacubitril-valsartan (ENTRESTO) 49-51 MG Take 0.5 tablets by mouth 2 (two) times daily.   Yes [provider]  tazarotene (AVAGE) 0.1 % cream Apply topically at bedtime. 06/11/21  Yes [name], MD  torsemide (DEMADEX) 20 MG tablet Take 40 mg by mouth daily.   Yes [provider]  umeclidinium-vilanterol (ANORO ELLIPTA) 62.5-25 MCG/ACT AEPB Inhale 1 puff into the lungs daily. 04/23/22  Yes Tyler Pita, MD  VENTOLIN HFA 108 [phone] Base) MCG/ACT inhaler Inhale 2 puffs into the lungs every 6 (six) hours as needed for shortness of breath. 11/23/20  Yes [provider]  Vitamin D, Ergocalciferol, (DRISDOL) 1.25 MG (50000 UNIT) CAPS capsule Take 50,000 Units by mouth once a week. 08/15/22  Yes [provider]  ENTRESTO 24-26 MG Take 1 tablet by mouth 2 (two) times daily. 09/16/22   [provider]  NON FORMULARY Pt uses a cpap nightly    [provider]  Omeprazole 20 MG TBDD Take 20 mg by mouth daily.    [provider]   Review of Systems  Constitutional:  Positive for appetite change (decreased) and fatigue.  HENT:  Negative for congestion, postnasal drip and sore throat.   Eyes: Negative.   Respiratory:  Positive for cough. Negative for chest tightness, shortness of breath and wheezing.   Cardiovascular:  Negative for chest pain, palpitations and leg swelling.  Gastrointestinal:  Positive for abdominal pain (from pancreatitis). Negative for abdominal distention, diarrhea and nausea.  Endocrine: Negative.   Genitourinary: Negative.   Musculoskeletal:  Negative for arthralgias and back pain.  Skin: Negative.   Allergic/Immunologic: Negative.   Neurological:  Negative for dizziness, light-headedness and headaches.  Hematological:  Negative for adenopathy. Does not bruise/bleed easily.  Psychiatric/Behavioral:  Negative for dysphoric mood and sleep disturbance (sleeping on 3 pillows with nocturnal ventilator/ oxygen). The patient  is not nervous/anxious.    Vitals:   11/14/22 1323  BP: 94/66  Pulse: 88  SpO2: 96%  Weight: 284 lb 6.4 oz (129 kg)   Wt Readings from Last 3 Encounters:  11/14/22 284 lb 6.4 oz (129 kg)  11/10/22 281 lb (127.5 kg)  10/20/22 292 lb 6 oz (132.6 kg)   Physical Exam Vitals and nursing note reviewed.  Constitutional:      Appearance: He is well-developed.  HENT:     Head: Normocephalic and atraumatic.  Neck:     Vascular: No JVD.  Cardiovascular:     Rate and Rhythm: Normal rate and regular rhythm.  Pulmonary:     Effort: Pulmonary effort is normal.     Breath sounds: No wheezing or rales.  Abdominal:     General: There is no distension.     Palpations: Abdomen is soft.     Tenderness: There is abdominal tenderness (RUQ).  Musculoskeletal:        General: No tenderness.     Cervical back: Normal range of motion and neck supple.     Right lower leg: No edema.     Left lower leg: No edema.  Skin:    General: Skin is warm and dry.  Neurological:     Mental Status: He is alert and oriented to person, place, and time.  Psychiatric:        Behavior: Behavior normal.        Thought Content: Thought content normal.     Assessment & Plan:  1: Chronic heart failure with reduced ejection fraction- - NYHA class II - euvolemic - weighing daily; Reminded to call for an overnight weight gain of >2 pounds or a weekly weight gain of >5 pounds.  - weight down 8 pounds from last visit here 1 month ago - echo 10/20/22: EF <20% along with mildly elevated PA pressure and mild MR. Echo 11/23/20: EF of <20% along with moderate LVH and moderately elevated PA pressure. Echo 02/27/20: EF of <20%. Echo 03/01/2019: EF of 15% along with mild MR. Echo 06/23/18: EF of 20-25% along with mild MR. Echo 02/06/15: EF of 25% which is unchanged from previous echo on 12/10/14 which showed an EF of 25% and mild MR.  - Stress test 03/01/2019: Abnormal myocardial perfusion scan no clear evidence of  reversible  ischemia inferior persistent defect either scar versus body  habitus artifact severely dilated left ventricular function severely  depressed LV function with ejection fraction of around 11%.  Recommend  medical therapy  - AICD (2016) in place but denies any shocks "in a long time" - jardiance 10mg  daily; hasn't taken this since pancreatitis flare - entresto 24/26mg  BID - metoprolol succinate 25mg  daily - torsemide 40mg  QD - in the past, spironolactone has caused hypotension - echo results reviewed w/ patient and EF continues to be low; refer to ADHF MD for further evaluation/ work-up; scheduled for 11/20/22 - BNP 12/26/20 was 136.9  2: HTN with CKD- - BP 94/66 - sees PCP (Mancheno) at [facility] 11/22/22 - BMP from 11/10/22 reviewed and showed sodium 138, potassium 3.6, Cr 1.8 and GFR 46 - saw nephrology (Korrapati) 10/07/22  3: Obstructive sleep apnea- - wears nocturnal ventilator QHS, 8 hours/night; reports sleeping better - appears to be benefitting from this therapy - continues wearing oxygen at 3L at bedtime & PRN during the day - saw pulmonology Patsey Berthold) 04/23/22  4: Diabetes- - A1c from 07/30/22 was 7.4% - home glucose this morning was 124  5: Tobacco use- - smokes 1/2 ppd of cigarettes - complete cessation discussed for 3 minutes  6: Atrial fibrillation- - RRR in office today - saw cardiology Dema Severin) 01/30/22   Return next week, sooner if needed.

## 2022-11-20 ENCOUNTER — Encounter: Payer: 59 | Admitting: Cardiology

## 2022-12-03 ENCOUNTER — Encounter: Payer: 59 | Admitting: Internal Medicine

## 2022-12-22 ENCOUNTER — Encounter: Payer: Self-pay | Admitting: Internal Medicine

## 2022-12-22 ENCOUNTER — Ambulatory Visit: Payer: 59 | Attending: Cardiology | Admitting: Internal Medicine

## 2022-12-22 ENCOUNTER — Ambulatory Visit: Payer: 59 | Admitting: Gastroenterology

## 2022-12-22 VITALS — BP 100/78 | HR 85 | Resp 14 | Wt 283.5 lb

## 2022-12-22 DIAGNOSIS — E1122 Type 2 diabetes mellitus with diabetic chronic kidney disease: Secondary | ICD-10-CM | POA: Diagnosis not present

## 2022-12-22 DIAGNOSIS — G4733 Obstructive sleep apnea (adult) (pediatric): Secondary | ICD-10-CM | POA: Diagnosis not present

## 2022-12-22 DIAGNOSIS — F17210 Nicotine dependence, cigarettes, uncomplicated: Secondary | ICD-10-CM | POA: Diagnosis not present

## 2022-12-22 DIAGNOSIS — N1831 Chronic kidney disease, stage 3a: Secondary | ICD-10-CM | POA: Insufficient documentation

## 2022-12-22 DIAGNOSIS — Z79899 Other long term (current) drug therapy: Secondary | ICD-10-CM | POA: Insufficient documentation

## 2022-12-22 DIAGNOSIS — I447 Left bundle-branch block, unspecified: Secondary | ICD-10-CM | POA: Diagnosis not present

## 2022-12-22 DIAGNOSIS — R5383 Other fatigue: Secondary | ICD-10-CM | POA: Insufficient documentation

## 2022-12-22 DIAGNOSIS — Z794 Long term (current) use of insulin: Secondary | ICD-10-CM

## 2022-12-22 DIAGNOSIS — Z95810 Presence of automatic (implantable) cardiac defibrillator: Secondary | ICD-10-CM | POA: Insufficient documentation

## 2022-12-22 DIAGNOSIS — Z720 Tobacco use: Secondary | ICD-10-CM

## 2022-12-22 DIAGNOSIS — I480 Paroxysmal atrial fibrillation: Secondary | ICD-10-CM | POA: Insufficient documentation

## 2022-12-22 DIAGNOSIS — I5022 Chronic systolic (congestive) heart failure: Secondary | ICD-10-CM | POA: Diagnosis present

## 2022-12-22 DIAGNOSIS — R0602 Shortness of breath: Secondary | ICD-10-CM | POA: Diagnosis not present

## 2022-12-22 DIAGNOSIS — Z7984 Long term (current) use of oral hypoglycemic drugs: Secondary | ICD-10-CM | POA: Diagnosis not present

## 2022-12-22 DIAGNOSIS — I130 Hypertensive heart and chronic kidney disease with heart failure and stage 1 through stage 4 chronic kidney disease, or unspecified chronic kidney disease: Secondary | ICD-10-CM | POA: Insufficient documentation

## 2022-12-22 DIAGNOSIS — E119 Type 2 diabetes mellitus without complications: Secondary | ICD-10-CM

## 2022-12-22 NOTE — Progress Notes (Unsigned)
ReDS Vest / Clip - 12/22/22 1234       ReDS Vest / Clip   Station Marker D    Ruler Value 38    ReDS Value Range High volume overload    ReDS Actual Value 53

## 2022-12-22 NOTE — Progress Notes (Unsigned)
ADVANCED HF CLINIC CONSULT NOTE  Referring Physician: Clarisa Kindred, NP Primary Care: Oscar Goodnight Presley Raddle, MD Primary Cardiologist: Dr. Juliann Pares [facility])   HPI:  Oscar Oscar [name] is a 47 y/o male with a history of OSA, PAF, HTN, DM2, CKD 3a, LBBB and chronic systolic heart failure s/p CRT-D. Referred by Clarisa Kindred, NP for further evaluation of his HF.  Has h/o longstanding CM.   Reports h/o MIs but cath 2016 with no CAD  Echo 4/16 EF 25% Echo 10/19 EF 20-25% Echo 6/21 EF < 20% Myoview 6/20 EF 11% no scar or ischemia Echo 2/24 EF < 20% high sphericity index RV mildly down. Mild Oscar   Underwent CRT-D in 2016  Saw Dr. Juliann Pares last week. Referred to Dr. Jose Persia for AHF evaluation   Says he gets around pretty good. Previously was walking to the store (1/4 mile) but over past 2 months stopped doing that because he feels like he gets SOB. Swelling ok. Says he takes torsemide 40 bid. Compliant with other meds as well. Denies CP. BP drops occasionally.    Review of Systems: [y] = yes,  = no   General: Weight gain ; Weight loss ; Anorexia ; Fatigue [ y]; Fever ; Chills ; Weakness   Cardiac: Chest pain/pressure ; Resting SOB ; Exertional SOB [place] ]; Orthopnea ; Pedal Edema ; Palpitations ; Syncope ; Presyncope ; Paroxysmal nocturnal dyspnea[ ]   Pulmonary: Cough ; Wheezing[ ] ; Hemoptysis[ ] ; Sputum ; Snoring   GI: Vomiting[ ] ; Dysphagia[ ] ; Melena[ ] ; Hematochezia ; Heartburn[ ] ; Abdominal pain ; Constipation ; Diarrhea ; BRBPR   GU: Hematuria[ ] ; Dysuria ; Nocturia[ ]   Vascular: Pain in legs with walking ; Pain in feet with lying flat ; Non-healing sores ; Stroke ; TIA ; Slurred speech ;  Neuro: Headaches[ ] ; Vertigo[ ] ; Seizures[ ] ; Paresthesias[ ] ;Blurred vision ; Diplopia ; Vision changes   Ortho/Skin: Arthritis ; Joint pain ; Muscle pain ; Joint swelling ; Back Pain ; Rash    Psych: Depression[ ] ; Anxiety[ ]   Heme: Bleeding problems ; Clotting disorders ; Anemia   Endocrine: Diabetes [ y]; Thyroid dysfunction[ ]    Past Medical History:  Diagnosis Date   AICD (automatic cardioverter/defibrillator) present    Aortic atherosclerosis    Asthma    Chronic respiratory failure    CKD (chronic kidney disease), stage III    COPD (chronic obstructive pulmonary disease)    Deafness in right ear    Diabetes mellitus without complication    Dyspnea    GERD (gastroesophageal reflux disease)    Gout    HFrEF (heart failure with reduced ejection fraction)    a.) TTE 12/10/14: EF 25%; diff inf HK; sev LV and mod LA dil; LVH. b.) TTE 06/23/18: EF 20-25%; LVH; mild LV dil, mild BAE. c.) TTE 03/01/19: EF 15%, LVH, BAE; triv PR/TR. d.) TTE 02/27/20: EF < 20%; sev LV dil; mild Oscar. e.) TTE 11/23/20: EF < 20%; glob HK; sev LV dil; LVH; mild-mod BAE; mod-sev TR, triv AR; G1DD. f.) TTE 05/14/21: EF <15%; LVH; sev LA and mild RV enlar; triv AR/PR, mild TR, mod Oscar.   Hiatal hernia    History of cardiac catheterization    a.) R/LHC 12/14/2009: normal coronaries.  b.) R/LHC 12/11/2014: normal coronaries.   Hyperlipidemia    Hypertension    Hypoxemia    LBBB (left bundle branch block)    NICM (nonischemic cardiomyopathy; dilated cardiomyopathy) (HCC)    a.) R/LHC 12/14/2009: normal cors; LVEDP 18 mmHg, mean PA 29 mmHg, mean PCWP 31 mmHg; CO 6 L/min; CI 2.54 L/min/m. b.) TTE 12/10/2014: EF 25%. c.) R/LHC 12/11/2014: mean RA 9 mmHg, mean PA 22 mmHg, mean PCWP 22 mmHg. d.) TTE 06/23/2018: EF 20-25%. e.) TTE 03/01/2019: EF 15%. f.) TTE 02/27/2020: < 20%. g.) TTE 11/23/2020: EF <20%. h.) TTE 05/14/2021: < 15%.   NSTEMI (non-ST elevated myocardial infarction)    a.) x 3 per patient report ---> 2012, 2014, 2016   Obesity    On supplemental oxygen by nasal cannula    a.) 2-3 L/[place] PRN   OSA on CPAP    PAF (paroxysmal atrial fibrillation)    a.) CHA2DS2-VASc = 4 (HFrEF, HTN, prior MI,  T2DM). b.) rate/rhythm maintained without pharmacologial intervention; no current anticoagulation.   Pancreatitis    PSVT (paroxysmal supraventricular tachycardia)     Current Outpatient Medications  Medication Sig Dispense Refill   acetaminophen (TYLENOL) 500 MG tablet Take 1,000 mg by mouth every 6 (six) hours as needed for moderate pain or mild pain.     allopurinol (ZYLOPRIM) 100 MG tablet Take by mouth.     atorvastatin (LIPITOR) 40 MG tablet Take 40 mg by mouth daily.     clindamycin (CLEOCIN-T) 1 % lotion Apply to affected areas daily after shower. 60 mL 3   colchicine 0.6 MG tablet Take 0.6 mg by mouth as needed (gout).     ENTRESTO 24-26 MG Take 1 tablet by mouth 2 (two) times daily.     JARDIANCE 10 MG TABS tablet Take 10 mg by mouth daily.     ketoconazole (NIZORAL) 2 % cream Apply twice daily to neck until clear 60 g 2   metoprolol succinate (TOPROL XL) 25 MG 24 hr tablet Take 1 tablet (25 mg total) by mouth daily. 30 tablet 5   mupirocin ointment (BACTROBAN) 2 % Apply topically 3 (three) times daily.     NON FORMULARY Pt uses a cpap nightly     omeprazole (PRILOSEC) 20 MG capsule Take 20 mg by mouth daily.     Omeprazole 20 MG TBDD Take 20 mg by mouth daily.     tazarotene (AVAGE) 0.1 % cream Apply topically at bedtime. 60 g 3   torsemide (DEMADEX) 20 MG tablet Take 40 mg by mouth daily.     umeclidinium-vilanterol (ANORO ELLIPTA) 62.5-25 MCG/ACT AEPB Inhale 1 puff into the lungs daily. 60 each 6   VENTOLIN HFA 108 (90 Base) MCG/ACT inhaler Inhale 2 puffs into the lungs every 6 (six) hours as needed for shortness of breath.     Vitamin D, Ergocalciferol, (DRISDOL) 1.25 MG (50000 UNIT) CAPS capsule Take 50,000 Units by mouth once a week.     No current facility-administered medications for this visit.    Allergies  Allergen Reactions   Ciprofloxacin Swelling and Other (See Comments)    Migraine Headache    Iodinated Contrast Media     Other reaction(s):  Vomiting Projectile vomiting   Isosorb Dinitrate-Hydralazine Other (See Comments)    Migraine Headache      Social History   Socioeconomic History   Marital status: Single    Spouse name: Not on file   Number of children: Not on file   Years of education:  Not on file   Highest education level: Not on file  Occupational History   Occupation: unemployed  Tobacco Use   Smoking status: Every Day    Packs/day: 1.50    Years: 34.00    Additional pack years: 0.00    Total pack years: 51.00    Types: Cigarettes    Passive exposure: Past   Smokeless tobacco: Never   Tobacco comments:    1/2 ppd [place], [place] 04/23/22   Vaping Use   Vaping Use: Never used  Substance and Sexual Activity   Alcohol use: No   Drug use: No   Sexual activity: Yes  Other Topics Concern   Not on file  Social History Narrative   Live alone   Social Determinants of Health   Financial Resource Strain: Not on file  Food Insecurity: Not on file  Transportation Needs: Not on file  Physical Activity: Not on file  Stress: Not on file  Social Connections: Not on file  Intimate Partner Violence: Not on file      Family History  Problem Relation Age of Onset   Hypertension Mother    Congestive Heart Failure Mother    Hypertension Sister    Diabetes Sister    Pancreatitis Sister    COPD Sister    Emphysema Sister        smoked   Pancreatitis Brother    Anemia Neg Hx    Arrhythmia Neg Hx    Asthma Neg Hx    Clotting disorder Neg Hx    Fainting Neg Hx    Heart attack Neg Hx    Heart disease Neg Hx    Heart failure Neg Hx    Hyperlipidemia Neg Hx     Vitals:   12/22/22 1136  BP: 100/78  Pulse: 85  Resp: 14  SpO2: 98%  Weight: 283 lb 8 oz (128.6 kg)   Wt Readings from Last 3 Encounters:  12/22/22 283 lb 8 oz (128.6 kg)  11/14/22 284 lb 6.4 oz (129 kg)  11/10/22 281 lb (127.5 kg)     PHYSICAL EXAM: General:  Fatigued appearing. No respiratory difficulty HEENT: normal Neck:  supple. no JVD. Carotids 2+ bilat; no bruits. No lymphadenopathy or thryomegaly appreciated. Cor: PMI laterally displaced. Regular rate & rhythm. No rubs, gallops or murmurs. Lungs: clear Abdomen: soft, nontender, nondistended. No hepatosplenomegaly. No bruits or masses. Good bowel sounds. Extremities: no cyanosis, clubbing, rash, edema Neuro: alert & oriented x 3, cranial nerves grossly intact. moves all 4 extremities w/o difficulty. Affect pleasant.  ECG: NSR 88 LBBB 198 ms Personally reviewed  ICD interrogated today: No VT/AF  Volume ok. No biv pacing Personally reviewed   ASSESSMENT & PLAN:  1: Chronic heart failure with reduced ejection fraction due to NICM -Cath 2016 with no CAD - Echo 4/16 EF 25% - Echo 10/19 EF 20-25% - Echo 6/21 EF < 20% - Echo 2/24 EF < 20% high sphericity index RV mildly down. Mild Oscar - Myoview 6/20 EF 11% no scar or ischemia - Underwent MDT CRT-D in 2016 - NYHA III - Volume status ok on torsemide 40 bid -  Continue Jardiance  daily -  Continue Entresto 24/26mg  BID -  Continue Toprol 25 daily - off spironolactone due to hypotension - ICD interrogated today as above. CRT has not been working - He has NYHA III symptoms with clinical worsening over past 6 months in setting of severe, persistent LV dysfunction. He is on reasonable GDMT but titration  has been limited by low BP. Given wide LBBB will ideally need LV lead re-activated unless CRT was not tolerated in past. Will also need CPX testing for further stratification regarding advanced therapies (Renal function, smoking, BMI and social situation will be prhibitive for transplant but RV likely able to support VAD) - I d/w Dr. Juliann Pares by telephone. Oscar [name] is scheduled to see Dr. Jose Persia ([facility]) on 01/06/23. I have reached out to Dr. Allena Katz as well to discuss.    2: CKD 3a  - Baseline Scr ~1.8 - Followed by Nephrology Oscar [name])  - Continue SGLT2i and ARNI   3: Obstructive sleep apnea- -  typically wears Bipap 8 hours/night been out for a month - saw pulmonology Oscar [name]) 04/23/22   4: Diabetes- - A1c from 07/30/22 was 7.4% - Per PCP - Continue SGLT2i. Consider GLP1RA (Body mass index is 45.76 kg/m.)   5: Tobacco use- - smokes 1/2 ppd of cigarettes - discussed need for cessation    6: PAF - Remains in NSR - Continue DOAC  Total time spent > 60 minutes. Over half that time spent discussing above.    Arvilla Meres, MD  12:11 PM

## 2022-12-22 NOTE — Patient Instructions (Signed)
Follow up in 3 months  Do the following things EVERYDAY: Weigh yourself in the morning before breakfast. Write it down and keep it in a log. Take your medicines as prescribed Eat low salt foods--Limit salt (sodium) to 2000 mg per day.  Stay as active as you can everyday Limit all fluids for the day to less than 2 liters

## 2023-01-08 ENCOUNTER — Encounter: Payer: Self-pay | Admitting: Podiatry

## 2023-01-08 ENCOUNTER — Ambulatory Visit (INDEPENDENT_AMBULATORY_CARE_PROVIDER_SITE_OTHER): Payer: 59

## 2023-01-08 ENCOUNTER — Ambulatory Visit (INDEPENDENT_AMBULATORY_CARE_PROVIDER_SITE_OTHER): Payer: 59 | Admitting: Podiatry

## 2023-01-08 VITALS — BP 107/76 | HR 93

## 2023-01-08 DIAGNOSIS — M7751 Other enthesopathy of right foot: Secondary | ICD-10-CM

## 2023-01-08 DIAGNOSIS — M2021 Hallux rigidus, right foot: Secondary | ICD-10-CM

## 2023-01-08 DIAGNOSIS — M7750 Other enthesopathy of unspecified foot: Secondary | ICD-10-CM

## 2023-01-08 NOTE — Progress Notes (Signed)
Subjective:  Patient ID: Oscar [name], male    DOB: 07/25/1976,  MRN: [id number]  Chief Complaint  Patient presents with   Foot Pain    "My big toe stays sitting up and it's been painful on my right foot.  My ankle has been hurting on the outside."    47 y.o. male presents with the above complaint.  Patient presents with right ankle capsulitis as well as right first metatarsophalangeal joint capsulitis with underlying arthritis.  Patient states been hurting for quite some time is progressive and worse with ambulation  He has not seen anyone else prior to seeing me for this.  He does not want to steroid injection.  He would like to discuss other treatment plans.  He is a diabetic.   Review of Systems: Negative except as noted in the HPI. Denies N/V/F/Ch.  Past Medical History:  Diagnosis Date   AICD (automatic cardioverter/defibrillator) present    Aortic atherosclerosis (HCC)    Asthma    Chronic respiratory failure (HCC)    CKD (chronic kidney disease), stage III (HCC)    COPD (chronic obstructive pulmonary disease) (HCC)    Deafness in right ear    Diabetes mellitus without complication (HCC)    Dyspnea    GERD (gastroesophageal reflux disease)    Gout    HFrEF (heart failure with reduced ejection fraction) (HCC)    a.) TTE 12/10/14: EF 25%; diff inf HK; sev LV and mod LA dil; LVH. b.) TTE 06/23/18: EF 20-25%; LVH; mild LV dil, mild BAE. c.) TTE 03/01/19: EF 15%, LVH, BAE; triv PR/TR. d.) TTE 02/27/20: EF < 20%; sev LV dil; mild MR. e.) TTE 11/23/20: EF < 20%; glob HK; sev LV dil; LVH; mild-mod BAE; mod-sev TR, triv AR; G1DD. f.) TTE 05/14/21: EF <15%; LVH; sev LA and mild RV enlar; triv AR/PR, mild TR, mod MR.   Hiatal hernia    History of cardiac catheterization    a.) R/LHC 12/14/2009: normal coronaries. b.) R/LHC 12/11/2014: normal coronaries.   Hyperlipidemia    Hypertension    Hypoxemia    LBBB (left bundle branch block)    NICM (nonischemic cardiomyopathy; dilated  cardiomyopathy) (HCC)    a.) R/LHC 12/14/2009: normal cors; LVEDP 18 mmHg, mean PA 29 mmHg, mean PCWP 31 mmHg; CO 6 L/min; CI 2.54 L/min/m. b.) TTE 12/10/2014: EF 25%. c.) R/LHC 12/11/2014: mean RA 9 mmHg, mean PA 22 mmHg, mean PCWP 22 mmHg. d.) TTE 06/23/2018: EF 20-25%. e.) TTE 03/01/2019: EF 15%. f.) TTE 02/27/2020: < 20%. g.) TTE 11/23/2020: EF <20%. h.) TTE 05/14/2021: < 15%.   NSTEMI (non-ST elevated myocardial infarction) (HCC)    a.) x 3 per patient report ---> 2012, 2014, 2016   Obesity    On supplemental oxygen by nasal cannula    a.) 2-3 L/[place] PRN   OSA on CPAP    PAF (paroxysmal atrial fibrillation) (HCC)    a.) CHA2DS2-VASc = 4 (HFrEF, HTN, prior MI, T2DM). b.) rate/rhythm maintained without pharmacologial intervention; no current anticoagulation.   Pancreatitis    PSVT (paroxysmal supraventricular tachycardia)     Current Outpatient Medications:    acetaminophen (TYLENOL) 500 MG tablet, Take 1,000 mg by mouth every 6 (six) hours as needed for moderate pain or mild pain., Disp: , Rfl:    allopurinol (ZYLOPRIM) 100 MG tablet, Take by mouth., Disp: , Rfl:    atorvastatin (LIPITOR) 40 MG tablet, Take 40 mg by mouth daily., Disp: , Rfl:    clindamycin (CLEOCIN-T)  1 % lotion, Apply to affected areas daily after shower., Disp: 60 mL, Rfl: 3   colchicine 0.6 MG tablet, Take 0.6 mg by mouth as needed (gout)., Disp: , Rfl:    ENTRESTO 24-26 MG, Take 1 tablet by mouth 2 (two) times daily., Disp: , Rfl:    JARDIANCE 10 MG TABS tablet, Take 10 mg by mouth daily., Disp: , Rfl:    ketoconazole (NIZORAL) 2 % cream, Apply twice daily to neck until clear, Disp: 60 g, Rfl: 2   metoprolol succinate (TOPROL XL) 25 MG 24 hr tablet, Take 1 tablet (25 mg total) by mouth daily., Disp: 30 tablet, Rfl: 5   mupirocin ointment (BACTROBAN) 2 %, Apply topically 3 (three) times daily., Disp: , Rfl:    NON FORMULARY, Pt uses a cpap nightly, Disp: , Rfl:    omeprazole (PRILOSEC) 20 MG capsule, Take 20 mg by  mouth daily., Disp: , Rfl:    Omeprazole 20 MG TBDD, Take 20 mg by mouth daily., Disp: , Rfl:    tazarotene (AVAGE) 0.1 % cream, Apply topically at bedtime., Disp: 60 g, Rfl: 3   torsemide (DEMADEX) 20 MG tablet, Take 40 mg by mouth daily., Disp: , Rfl:    umeclidinium-vilanterol (ANORO ELLIPTA) 62.5-25 MCG/ACT AEPB, Inhale 1 puff into the lungs daily., Disp: 60 each, Rfl: 6   VENTOLIN HFA 108 (90 Base) MCG/ACT inhaler, Inhale 2 puffs into the lungs every 6 (six) hours as needed for shortness of breath., Disp: , Rfl:    Vitamin D, Ergocalciferol, (DRISDOL) 1.25 MG (50000 UNIT) CAPS capsule, Take 50,000 Units by mouth once a week., Disp: , Rfl:   Social History   Tobacco Use  Smoking Status Every Day   Packs/day: 1.50   Years: 34.00   Additional pack years: 0.00   Total pack years: 51.00   Types: Cigarettes   Passive exposure: Past  Smokeless Tobacco Never  Tobacco Comments   1/2 ppd Vernie Murders, [place] 04/23/22     Allergies  Allergen Reactions   Ciprofloxacin Swelling and Other (See Comments)    Migraine Headache    Iodinated Contrast Media     Other reaction(s): Vomiting Projectile vomiting   Isosorb Dinitrate-Hydralazine Other (See Comments)    Migraine Headache   Objective:   Vitals:   01/08/23 0859  BP: 107/76  Pulse: 93   There is no height or weight on file to calculate BMI. Constitutional Well developed. Well nourished.  Vascular Dorsalis pedis pulses palpable bilaterally. Posterior tibial pulses palpable bilaterally. Capillary refill normal to all digits.  No cyanosis or clubbing noted. Pedal hair growth normal.  Neurologic Normal speech. Oriented to person, place, and time. Epicritic sensation to light touch grossly present bilaterally.  Dermatologic Nails well groomed and normal in appearance. No open wounds. No skin lesions.  Orthopedic: Pain on palpation of right first metatarsophalangeal joint pain with range of motion of the joint deep  intra-articular pain noted.  Pain with range of motion of the ankle joint.  Particular pain noted.  Some crepitus clinically appreciated.  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  There is some osteoarthritis noted at the ankle joint MTP arthritis noted with underlying bunion deformity moderate in nature. Assessment:   1. Capsulitis of metatarsophalangeal (MTP) joint of right foot   2. Capsulitis of ankle, right    Plan:  Patient  was evaluated and treated and all questions answered.  Right ankle capsulitis and first metatarsophalangeal joint capsulitis -All questions and concerns were discussed with the patient in extensive detail given the amount of pain that is any benefit from cam boot immobilization.  Cam boot was dispensed.  This will allow the soft tissue structure to heal appropriately and calm the pain down if there is still some residual pain we will discuss steroid injection during next visit.  Patient agrees with the plan  No follow-ups on file.

## 2023-01-16 ENCOUNTER — Ambulatory Visit: Payer: 59 | Admitting: Podiatry

## 2023-01-22 ENCOUNTER — Ambulatory Visit: Payer: 59 | Admitting: Podiatry

## 2023-01-22 IMAGING — CR DG CHEST 2V
1 series · 2 of 2 positions shown · non-contrast
Comparison: 03/28/2020

CLINICAL DATA: Shortness of breath

EXAM:
CHEST - 2 VIEW

[Series 1: dg chest 2 view · 0.14mm/px · 2 of 2 slices shown]
[im 1/2]
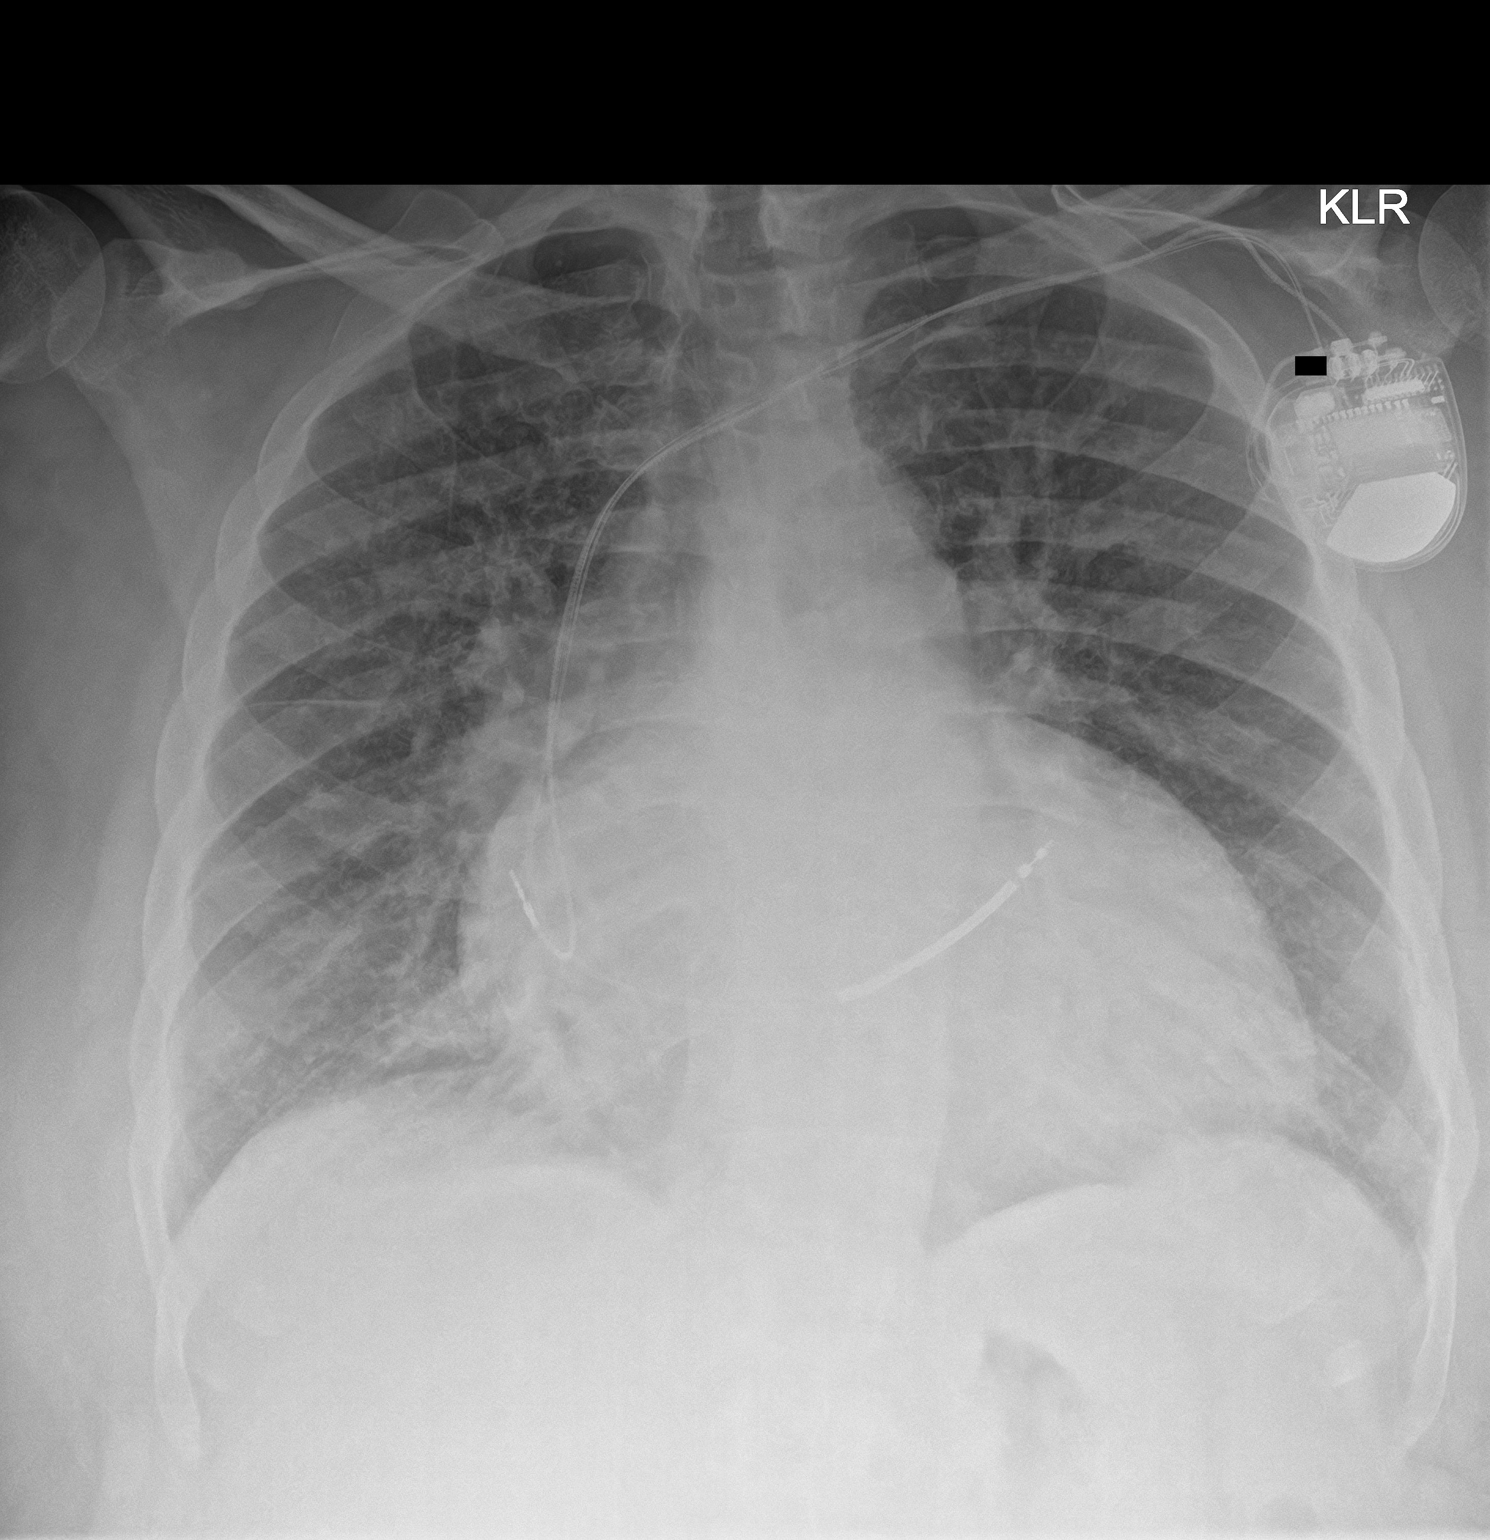
[im 2/2]
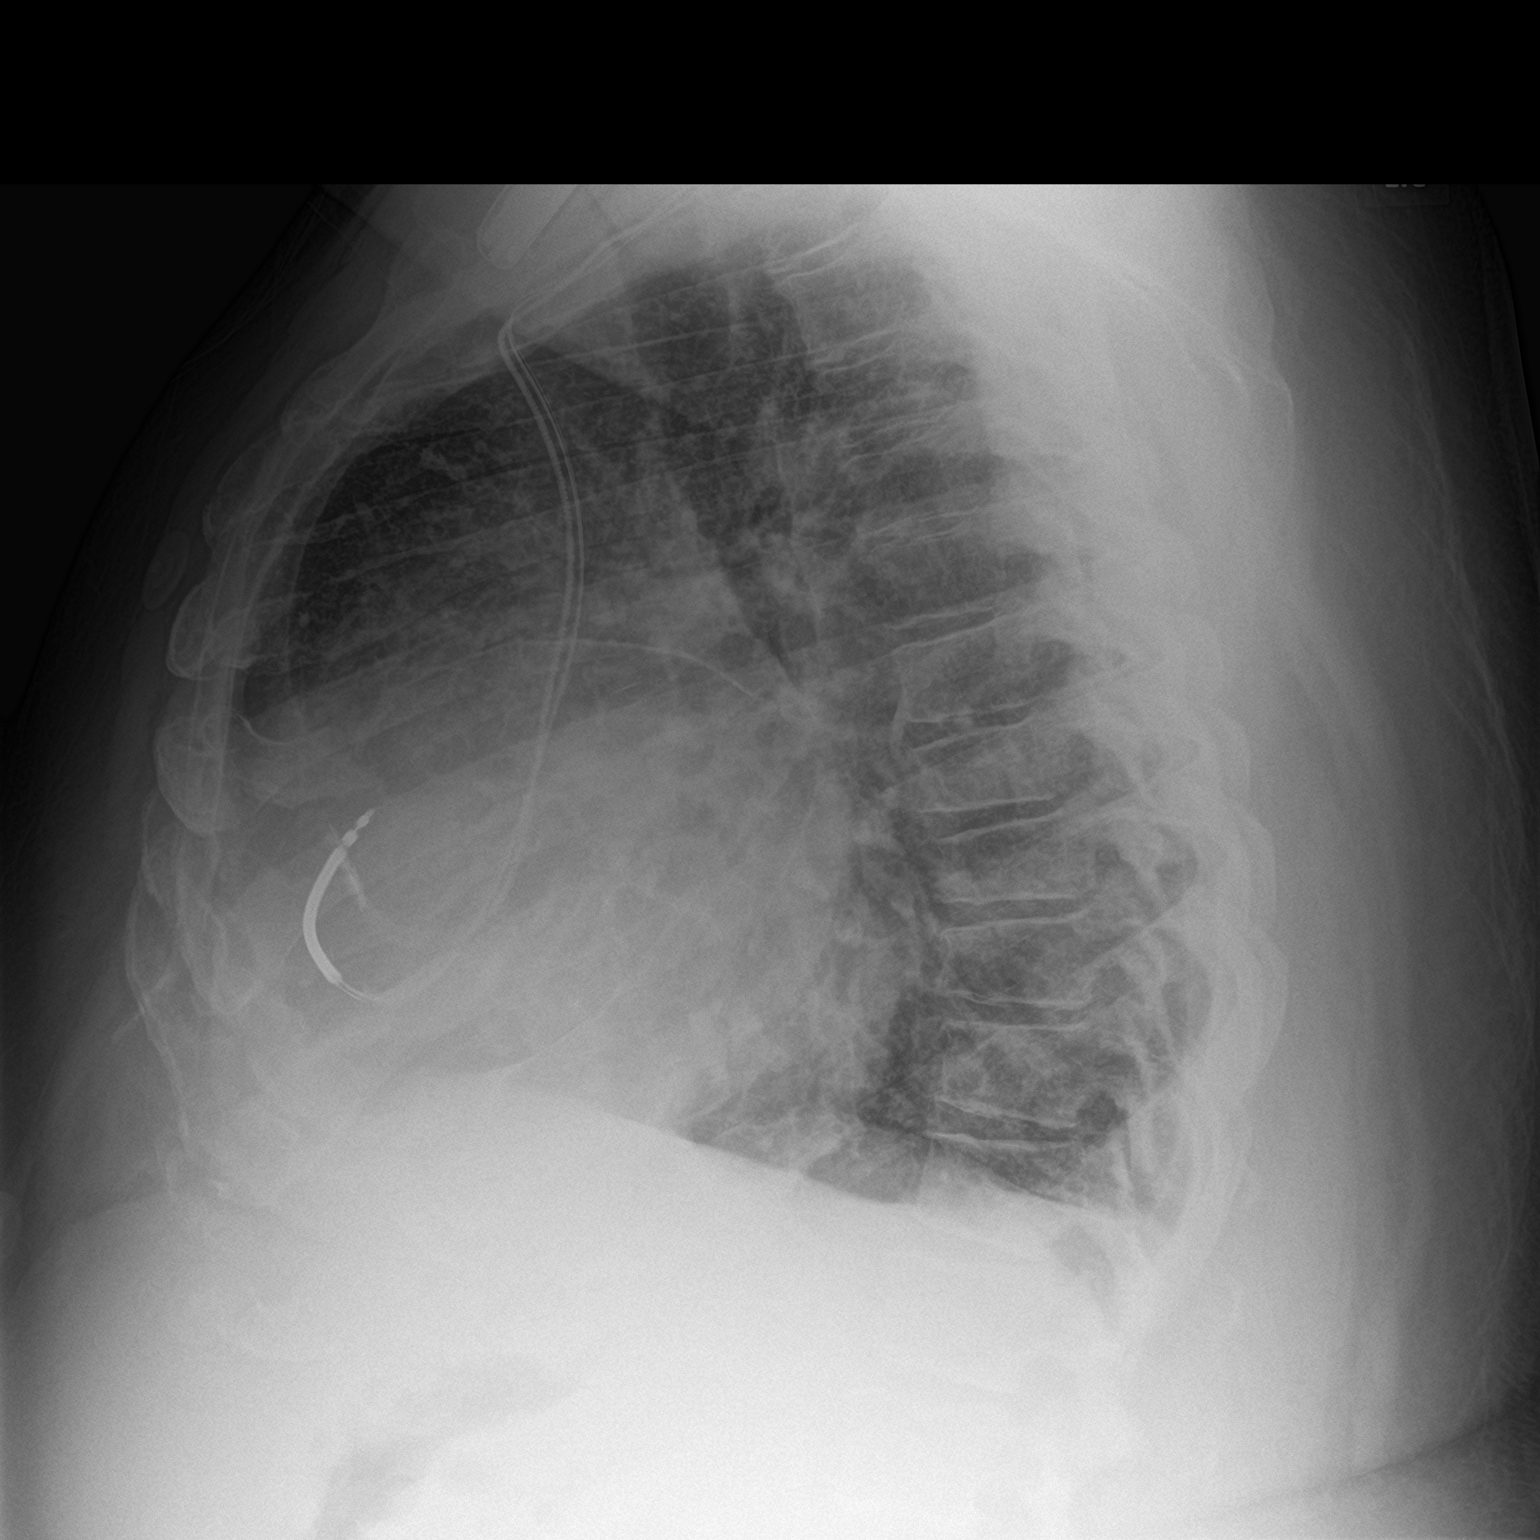

[2 of 2 positions shown; findings below may reference images not displayed]

FINDINGS: Cardiomegaly. New interstitial prominence diffusely. Dual-chamber
ICD/pacer leads in unchanged position. No effusion or pneumothorax
seen.
IMPRESSION: CHF pattern.

## 2023-01-22 IMAGING — CT CT ANGIO CHEST-ABD-PELV FOR DISSECTION W/ AND WO/W CM
1 of 7 series · 7 of 46 positions shown, 8 images · IV contrast (APPLIED)
Comparison: Chest radiograph November 23, 2020; CT abdomen and pelvis
March 28, 2020. Chest radiograph May 28, 2017
COMPARISON: Chest radiograph November 23, 2020; CT abdomen and pelvis
March 28, 2020. Chest radiograph May 28, 2017

Addendum:
CLINICAL DATA: Chest and abdominal pain radiating toward back.
Shortness of breath

EXAM:
CT ANGIOGRAPHY CHEST, ABDOMEN AND PELVIS
TECHNIQUE: Non-contrast CT of the chest was initially obtained.

[Series 6: axial arterial · axial · arterial · 0.88mm/px · z∈[-552,-66]mm · 7 of 216 slices shown, 8 images]
[im 27/216  soft-tissue]
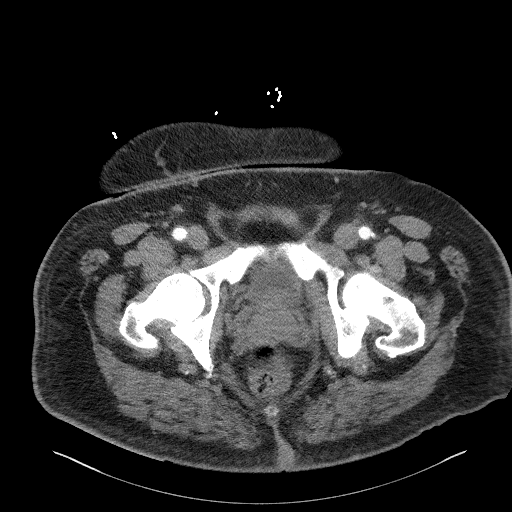
[im 27/216  bone]
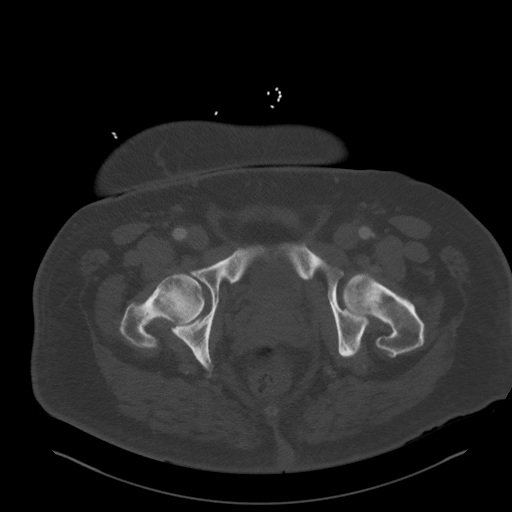
[im 54/216  soft-tissue]
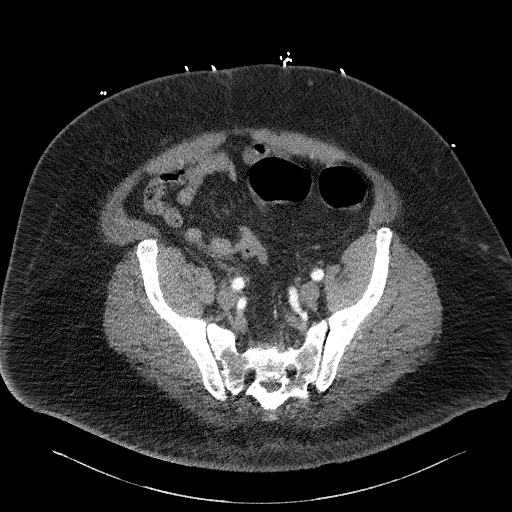
[im 81/216  soft-tissue]
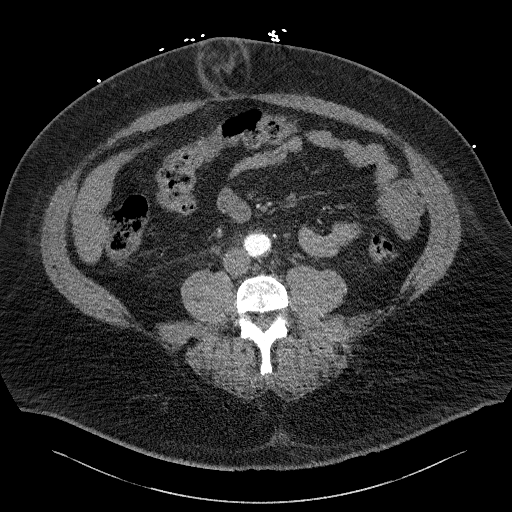
[im 108/216  soft-tissue]
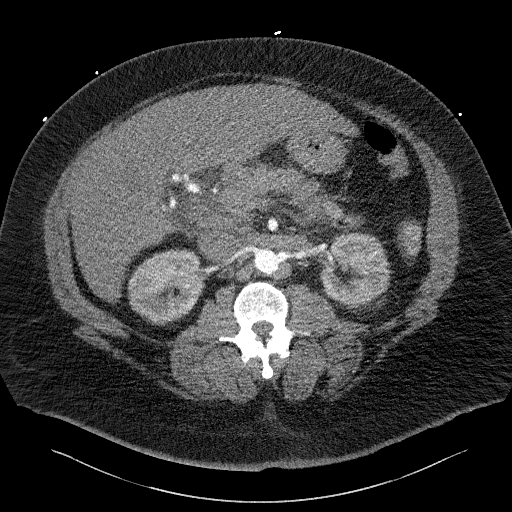
[im 135/216  soft-tissue]
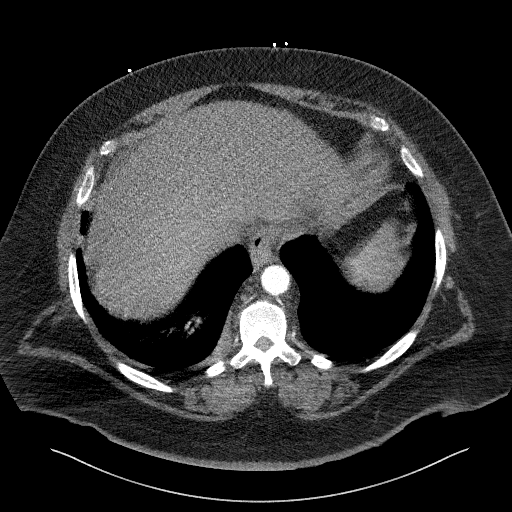
[im 162/216  soft-tissue]
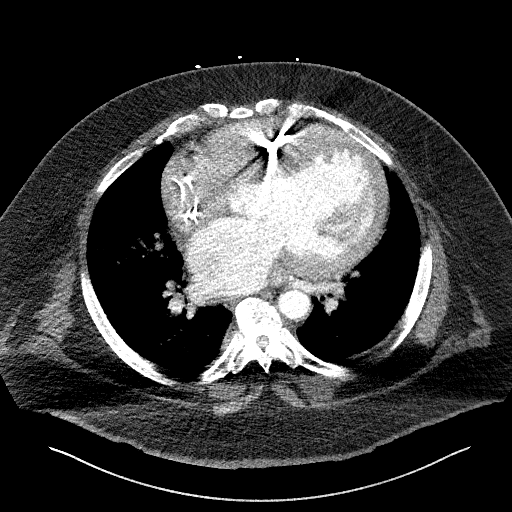
[im 189/216  soft-tissue]
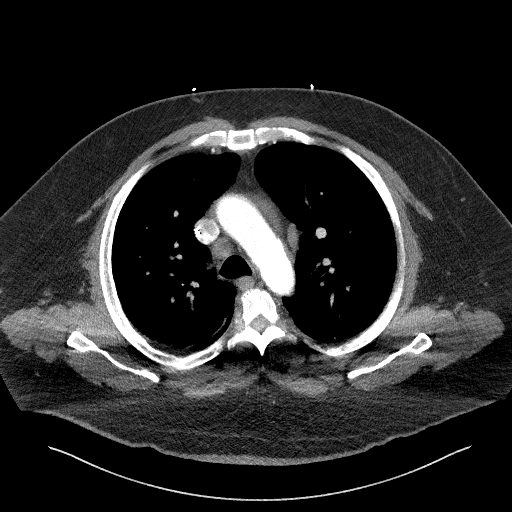

[7 of 46 positions shown; findings below may reference images not displayed]

Multidetector CT imaging through the chest, abdomen and pelvis was
performed using the standard protocol during bolus administration of
intravenous contrast. Multiplanar reconstructed images and MIPs were
obtained and reviewed to evaluate the vascular anatomy.

CONTRAST:  100mL OMNIPAQUE IOHEXOL 350 MG/ML SOLN
FINDINGS: CTA CHEST FINDINGS

Cardiovascular: No evident intramural hematoma in the thoracic aorta
on noncontrast enhanced study. There is no evident mediastinal
hematoma. There is no appreciable thoracic aortic aneurysm or
dissection. Visualized great vessels appear unremarkable. Note that
the right innominate and left common carotid arteries arise as a
common trunk, an anatomic variant. There is no appreciable aneurysm
or dissection involving the visualized great vessels. No appreciable
atherosclerotic plaque noted in the visualized great vessels. Rather
minimal atherosclerotic calcification noted in the aorta. No
evidence of aortic ulceration in the thoracic region.

No appreciable pulmonary embolus. Main pulmonary outflow tract
measures 4.0 cm, enlarged.

Heart is enlarged with pacemaker leads attached to the right atrium
and right ventricle. No pericardial effusion or pericardial
thickening.

Mediastinum/Nodes: Visualized thyroid appears unremarkable. There is
lymph node enlargement slightly to the right of the lower trachea
measuring 1.5 x 1.5 cm. A lymph node in the aortopulmonary window
region measures 1.2 x 1.1 cm. There are scattered subcentimeter
mediastinal lymph nodes as well. No esophageal lesions are evident.

Lungs/Pleura: Scattered bullae are noted in the apices as well as
throughout portions of the right lower lobe. There is no edema or
consolidation. No pleural effusions. There are areas of mosaic
attenuation in the lower lobe regions bilaterally. No pneumothorax.
Trachea and major bronchial structures appear normal.

Musculoskeletal: No acute appearing fracture or dislocation. There
is mild anterior wedging of the T8, T9, and T10 vertebral bodies,
stable. No blastic or lytic bone lesions. No evident chest wall
lesions.

Review of the MIP images confirms the above findings.

CTA ABDOMEN AND PELVIS FINDINGS

VASCULAR

Aorta: There is no abdominal aortic aneurysm or dissection. There is
modest plaque in the distal aorta, not causing hemodynamically
significant obstruction.

Celiac: Celiac artery and its branches are widely patent. No
aneurysm or dissection involving these vessels.

SMA: Superior mesenteric artery and its branches are widely patent.
No aneurysm or dissection involving these vessels.

Renals: There are 2 renal arteries on each side. There is no
appreciable aneurysm or dissection involving renal arteries or
respective branches. No fibromuscular dysplasia. No appreciable
atherosclerotic irregularity in these vessels.

IMA: Inferior mesenteric artery and its branches are widely patent.
No aneurysm or dissection involving these vessels.

Inflow: Minimal atherosclerotic calcification is noted at the
origins of each common iliac artery. There is also calcification in
the proximal left internal iliac artery. Other major arterial pelvic
vessels are widely patent. No aneurysm or dissection involving
pelvic arterial vessels noted. Visualized profunda femoral and
superficial femoral arteries are patent without aneurysm or
dissection involving these vessels.

Veins: No obvious venous abnormality within the limitations of this
arterial phase study.

Review of the MIP images confirms the above findings.

NON-VASCULAR

Hepatobiliary: Liver measures 20.9 cm in length. Subtle irregularity
along the liver capsule again noted. No focal liver lesions are
appreciable. There is questionable thickening of the gallbladder
wall. No evident biliary duct dilatation.

Pancreas: No evident pancreatic mass or inflammatory focus.

Spleen: No splenic lesions are appreciable.

Adrenals/Urinary Tract: Adrenals bilaterally appear normal. No
evident renal mass or hydronephrosis on either side. There is no
appreciable renal or ureteral calculus on either side. Urinary
bladder is midline with wall thickness within normal limits.

Stomach/Bowel: There is no appreciable bowel wall or mesenteric
thickening. The terminal ileum appears normal. There is no evident
bowel obstruction. Appendix appears normal. No evident free air or
portal venous air.

Lymphatic: There are subcentimeter inguinal lymph nodes bilaterally,
considered nonspecific and stable. There is a lymph node anterior to
the inferior vena cava in the periportal region with a short axis
diameter of 1.4 cm, stable. Subcentimeter retroperitoneal lymph
nodes are stable. No new lymph node enlargement evident.

Reproductive: Prostate prominent but stable. Seminal vesicles appear
normal. Prostate abuts the inferior aspect of the bladder, a stable
finding.

Other: No evident abscess or ascites in the abdomen or pelvis. There
is a focal umbilical hernia containing fat but no bowel, unchanged
from prior study.

Musculoskeletal: Degenerative changes noted in the lumbar spine.
There are no blastic or lytic bone lesions. No intramuscular lesions
evident.

Review of the MIP images confirms the above findings.
IMPRESSION: CT angiogram chest:

1. No thoracic aortic aneurysm or dissection. Minimal aortic
atherosclerosis. Visualized great vessels appear unremarkable.

2. Scattered areas of bullous disease, more severe on the right than
on the left. Scattered areas of mosaic attenuation in the lower
lobes likely represents underlying small airways obstructive
disease. No edema or consolidation. No pleural effusions.

3. Prominent inferior pretracheal and aortopulmonary window lymph
nodes of uncertain etiology.

4. Pacemaker leads attached to right heart. There is cardiomegaly.
No pericardial effusion.

5.  No evident pulmonary embolus.

CT angiogram abdomen; CT angiogram pelvis:

1. Mild atherosclerotic plaque in the aorta and proximal common
iliac arteries. There is also atherosclerotic plaque in the internal
iliac artery. No hemodynamically significant obstruction evident in
the aorta, major mesenteric, and major pelvic arterial vessels.

2. No aneurysm or dissection involving the aorta, major mesenteric,
and major pelvic arterial vessels. No fibromuscular dysplasia.

3. The appearance of the liver suggests a degree of cirrhosis. A
prominent pericaval lymph node is stable and may be secondary to
underlying hepatic cirrhosis.

4. Question a degree of gallbladder wall thickening. This finding
may warrant ultrasound of the gallbladder to further assess.

5. No evident bowel wall thickening or bowel obstruction. No abscess
in the abdomen or pelvis. Appendix unremarkable.

6. No renal or ureteral calculus. No hydronephrosis. Urinary bladder
wall thickness normal.

7. Prominent prostate, stable. This finding may warrant correlation
with PSA.

8.  Stable umbilical hernia containing fat but no bowel.

Aortic Atherosclerosis (XC67R-STF.F).

ADDENDUM:
Add to addendum of chest CT angiogram: Enlargement of the main
pulmonary outflow tract, a finding indicative of pulmonary arterial
hypertension.

*** End of Addendum ***
Multidetector CT imaging through the chest, abdomen and pelvis was
performed using the standard protocol during bolus administration of
intravenous contrast. Multiplanar reconstructed images and MIPs were
obtained and reviewed to evaluate the vascular anatomy.

CONTRAST:  100mL OMNIPAQUE IOHEXOL 350 MG/ML SOLN
FINDINGS: CTA CHEST FINDINGS

Cardiovascular: No evident intramural hematoma in the thoracic aorta
on noncontrast enhanced study. There is no evident mediastinal
hematoma. There is no appreciable thoracic aortic aneurysm or
dissection. Visualized great vessels appear unremarkable. Note that
the right innominate and left common carotid arteries arise as a
common trunk, an anatomic variant. There is no appreciable aneurysm
or dissection involving the visualized great vessels. No appreciable
atherosclerotic plaque noted in the visualized great vessels. Rather
minimal atherosclerotic calcification noted in the aorta. No
evidence of aortic ulceration in the thoracic region.

No appreciable pulmonary embolus. Main pulmonary outflow tract
measures 4.0 cm, enlarged.

Heart is enlarged with pacemaker leads attached to the right atrium
and right ventricle. No pericardial effusion or pericardial
thickening.

Mediastinum/Nodes: Visualized thyroid appears unremarkable. There is
lymph node enlargement slightly to the right of the lower trachea
measuring 1.5 x 1.5 cm. A lymph node in the aortopulmonary window
region measures 1.2 x 1.1 cm. There are scattered subcentimeter
mediastinal lymph nodes as well. No esophageal lesions are evident.

Lungs/Pleura: Scattered bullae are noted in the apices as well as
throughout portions of the right lower lobe. There is no edema or
consolidation. No pleural effusions. There are areas of mosaic
attenuation in the lower lobe regions bilaterally. No pneumothorax.
Trachea and major bronchial structures appear normal.

Musculoskeletal: No acute appearing fracture or dislocation. There
is mild anterior wedging of the T8, T9, and T10 vertebral bodies,
stable. No blastic or lytic bone lesions. No evident chest wall
lesions.

Review of the MIP images confirms the above findings.

CTA ABDOMEN AND PELVIS FINDINGS

VASCULAR

Aorta: There is no abdominal aortic aneurysm or dissection. There is
modest plaque in the distal aorta, not causing hemodynamically
significant obstruction.

Celiac: Celiac artery and its branches are widely patent. No
aneurysm or dissection involving these vessels.

SMA: Superior mesenteric artery and its branches are widely patent.
No aneurysm or dissection involving these vessels.

Renals: There are 2 renal arteries on each side. There is no
appreciable aneurysm or dissection involving renal arteries or
respective branches. No fibromuscular dysplasia. No appreciable
atherosclerotic irregularity in these vessels.

IMA: Inferior mesenteric artery and its branches are widely patent.
No aneurysm or dissection involving these vessels.

Inflow: Minimal atherosclerotic calcification is noted at the
origins of each common iliac artery. There is also calcification in
the proximal left internal iliac artery. Other major arterial pelvic
vessels are widely patent. No aneurysm or dissection involving
pelvic arterial vessels noted. Visualized profunda femoral and
superficial femoral arteries are patent without aneurysm or
dissection involving these vessels.

Veins: No obvious venous abnormality within the limitations of this
arterial phase study.

Review of the MIP images confirms the above findings.

NON-VASCULAR

Hepatobiliary: Liver measures 20.9 cm in length. Subtle irregularity
along the liver capsule again noted. No focal liver lesions are
appreciable. There is questionable thickening of the gallbladder
wall. No evident biliary duct dilatation.

Pancreas: No evident pancreatic mass or inflammatory focus.

Spleen: No splenic lesions are appreciable.

Adrenals/Urinary Tract: Adrenals bilaterally appear normal. No
evident renal mass or hydronephrosis on either side. There is no
appreciable renal or ureteral calculus on either side. Urinary
bladder is midline with wall thickness within normal limits.

Stomach/Bowel: There is no appreciable bowel wall or mesenteric
thickening. The terminal ileum appears normal. There is no evident
bowel obstruction. Appendix appears normal. No evident free air or
portal venous air.

Lymphatic: There are subcentimeter inguinal lymph nodes bilaterally,
considered nonspecific and stable. There is a lymph node anterior to
the inferior vena cava in the periportal region with a short axis
diameter of 1.4 cm, stable. Subcentimeter retroperitoneal lymph
nodes are stable. No new lymph node enlargement evident.

Reproductive: Prostate prominent but stable. Seminal vesicles appear
normal. Prostate abuts the inferior aspect of the bladder, a stable
finding.

Other: No evident abscess or ascites in the abdomen or pelvis. There
is a focal umbilical hernia containing fat but no bowel, unchanged
from prior study.

Musculoskeletal: Degenerative changes noted in the lumbar spine.
There are no blastic or lytic bone lesions. No intramuscular lesions
evident.

Review of the MIP images confirms the above findings.
IMPRESSION: CT angiogram chest:

1. No thoracic aortic aneurysm or dissection. Minimal aortic
atherosclerosis. Visualized great vessels appear unremarkable.

2. Scattered areas of bullous disease, more severe on the right than
on the left. Scattered areas of mosaic attenuation in the lower
lobes likely represents underlying small airways obstructive
disease. No edema or consolidation. No pleural effusions.

3. Prominent inferior pretracheal and aortopulmonary window lymph
nodes of uncertain etiology.

4. Pacemaker leads attached to right heart. There is cardiomegaly.
No pericardial effusion.

5.  No evident pulmonary embolus.

CT angiogram abdomen; CT angiogram pelvis:

1. Mild atherosclerotic plaque in the aorta and proximal common
iliac arteries. There is also atherosclerotic plaque in the internal
iliac artery. No hemodynamically significant obstruction evident in
the aorta, major mesenteric, and major pelvic arterial vessels.

2. No aneurysm or dissection involving the aorta, major mesenteric,
and major pelvic arterial vessels. No fibromuscular dysplasia.

3. The appearance of the liver suggests a degree of cirrhosis. A
prominent pericaval lymph node is stable and may be secondary to
underlying hepatic cirrhosis.

4. Question a degree of gallbladder wall thickening. This finding
may warrant ultrasound of the gallbladder to further assess.

5. No evident bowel wall thickening or bowel obstruction. No abscess
in the abdomen or pelvis. Appendix unremarkable.

6. No renal or ureteral calculus. No hydronephrosis. Urinary bladder
wall thickness normal.

7. Prominent prostate, stable. This finding may warrant correlation
with PSA.

8.  Stable umbilical hernia containing fat but no bowel.

Aortic Atherosclerosis (XC67R-STF.F).

## 2023-01-27 ENCOUNTER — Ambulatory Visit (INDEPENDENT_AMBULATORY_CARE_PROVIDER_SITE_OTHER): Payer: 59 | Admitting: Podiatry

## 2023-01-27 ENCOUNTER — Telehealth: Payer: Self-pay | Admitting: Podiatry

## 2023-01-27 DIAGNOSIS — M79674 Pain in right toe(s): Secondary | ICD-10-CM

## 2023-01-27 DIAGNOSIS — E119 Type 2 diabetes mellitus without complications: Secondary | ICD-10-CM

## 2023-01-27 DIAGNOSIS — B351 Tinea unguium: Secondary | ICD-10-CM

## 2023-01-27 DIAGNOSIS — Z794 Long term (current) use of insulin: Secondary | ICD-10-CM

## 2023-01-27 DIAGNOSIS — M79675 Pain in left toe(s): Secondary | ICD-10-CM | POA: Diagnosis not present

## 2023-01-27 DIAGNOSIS — R2689 Other abnormalities of gait and mobility: Secondary | ICD-10-CM

## 2023-01-27 MED ORDER — METHYLPREDNISOLONE 4 MG PO TBPK: ORAL_TABLET | ORAL | 0 refills | Status: DC

## 2023-01-27 NOTE — Progress Notes (Signed)
  Subjective:  Patient ID: Oscar [name], male    DOB: December 26, 1975,  MRN: [id number]  Chief Complaint  Patient presents with   Nail Problem    47 y.o. male presents with the above complaint. History confirmed with patient.  His nails are thickened and elongated and causing discomfort again, says his diabetes is well controlled  Denies tingling numbness or neuropathy symptoms  Objective:  Physical Exam: warm, good capillary refill, no trophic changes or ulcerative lesions, normal DP and PT pulses, and normal sensory exam. Left Foot: dystrophic yellowed discolored nail plates with subungual debris Right Foot: dystrophic yellowed discolored nail plates with subungual debris  Assessment:   1. Pain due to onychomycosis of toenails of both feet   2. Type 2 diabetes mellitus without complication, with long-term current use of insulin (HCC)        Plan:  Patient was evaluated and treated and all questions answered.  Discussed the etiology and treatment options for the condition in detail with the patient. Educated patient on the topical and oral treatment options for mycotic nails. Recommended debridement of the nails today. Sharp and mechanical debridement performed of all painful and mycotic nails today. Nails debrided in length and thickness using a nail nipper to level of comfort. Discussed treatment options including appropriate shoe gear. Follow up as needed for painful nails.    No follow-ups on file.

## 2023-01-27 NOTE — Telephone Encounter (Signed)
"  I'm calling to see if Dr. Allena Katz ordered my walker with a seat yet.  I was there earlier today."  I don't see an order at this time.  I will send Dr. Allena Katz and his assistant a message.

## 2023-01-28 NOTE — Addendum Note (Signed)
Addended by: Nicholes Rough on: 01/28/2023 08:40 AM   Modules accepted: Orders

## 2023-02-04 ENCOUNTER — Encounter: Payer: 59 | Admitting: Dermatology

## 2023-02-10 ENCOUNTER — Ambulatory Visit (INDEPENDENT_AMBULATORY_CARE_PROVIDER_SITE_OTHER): Payer: 59 | Admitting: Podiatry

## 2023-02-10 DIAGNOSIS — M79675 Pain in left toe(s): Secondary | ICD-10-CM

## 2023-02-10 DIAGNOSIS — M2021 Hallux rigidus, right foot: Secondary | ICD-10-CM | POA: Diagnosis not present

## 2023-02-10 DIAGNOSIS — R2689 Other abnormalities of gait and mobility: Secondary | ICD-10-CM

## 2023-02-10 DIAGNOSIS — B351 Tinea unguium: Secondary | ICD-10-CM

## 2023-02-10 DIAGNOSIS — E119 Type 2 diabetes mellitus without complications: Secondary | ICD-10-CM

## 2023-02-10 DIAGNOSIS — M79674 Pain in right toe(s): Secondary | ICD-10-CM

## 2023-02-10 DIAGNOSIS — M7751 Other enthesopathy of right foot: Secondary | ICD-10-CM

## 2023-02-10 MED ORDER — METHYLPREDNISOLONE 4 MG PO TBPK: ORAL_TABLET | ORAL | 0 refills | Status: DC

## 2023-02-10 MED ORDER — MELOXICAM 15 MG PO TABS: 15.0000 mg | ORAL_TABLET | Freq: Every day | ORAL | 0 refills | Status: DC

## 2023-02-10 NOTE — Progress Notes (Signed)
Diabetic shoe was dispensed is functioning well.  No complaints

## 2023-02-17 ENCOUNTER — Other Ambulatory Visit: Payer: Self-pay | Admitting: Gastroenterology

## 2023-02-17 DIAGNOSIS — R1013 Epigastric pain: Secondary | ICD-10-CM

## 2023-03-20 NOTE — Progress Notes (Unsigned)
PCP: Primary Cardiologist:  HPI:  Mr Shell is a 47 y/o male with a history of obstructive sleep apnea (currently with CPAP), MI, HTN, hyperlipidemia, GERD, DM, SVT, asthma and chronic heart failure.  Echo 10/20/22: EF <20% along with mildly elevated PA pressure and mild MR. Echo 11/23/20: EF of <20% along with moderate LVH and moderately elevated PA pressure. Echo 02/27/20: EF of <20%. Echo 03/01/2019: EF of 15% along with mild MR. Echo 06/23/18: EF of 20-25% along with mild MR. Echo 02/06/15: EF of 25% which is unchanged from previous echo on 12/10/14 which showed an EF of 25% and mild MR.   Stress test 03/01/2019: Abnormal myocardial perfusion scan no clear evidence of  reversible ischemia inferior persistent defect either scar versus body  habitus artifact severely dilated left ventricular function severely  depressed LV function with ejection fraction of around 11%.  Recommend  medical therapy   Has not been admitted or been in the ED in the last 6 months.   He presents today for a HF follow-up visit with a chief complaint of minimal fatigue with moderate exertion. Chronic in nature. Has associated cough, abdominal pain and decreased appetite. Denies any difficulty sleeping, dizziness, abdominal distention, palpitations, pedal edema, chest pain, wheezing, SOB or weight gain.   Says that he's had recent flare of his pancreatitis but can't pin point what would have triggered this. Hasn't been eating much due to the pain. Had recent abdominal/ pelvic CT done.      ROS: All systems negative except as listed in HPI, PMH and Problem List.  SH:  Social History   Socioeconomic History   Marital status: Single    Spouse name: Not on file   Number of children: Not on file   Years of education: Not on file   Highest education level: Not on file  Occupational History   Occupation: unemployed  Tobacco Use   Smoking status: Every Day    Current packs/day: 1.50    Average packs/day: 1.5  packs/day for 34.0 years (51.0 ttl pk-yrs)    Types: Cigarettes    Passive exposure: Past   Smokeless tobacco: Never   Tobacco comments:    1/2 ppd Vernie Murders, [facility] 04/23/22   Vaping Use   Vaping status: Never Used  Substance and Sexual Activity   Alcohol use: No   Drug use: No   Sexual activity: Yes  Other Topics Concern   Not on file  Social History Narrative   Live alone   Social Determinants of Health   Financial Resource Strain: Not on file  Food Insecurity: Not on file  Transportation Needs: Not on file  Physical Activity: Not on file  Stress: Not on file  Social Connections: Not on file  Intimate Partner Violence: Not on file    FH:  Family History  Problem Relation Age of Onset   Hypertension Mother    Congestive Heart Failure Mother    Hypertension Sister    Diabetes Sister    Pancreatitis Sister    COPD Sister    Emphysema Sister        smoked   Pancreatitis Brother    Anemia Neg Hx    Arrhythmia Neg Hx    Asthma Neg Hx    Clotting disorder Neg Hx    Fainting Neg Hx    Heart attack Neg Hx    Heart disease Neg Hx    Heart failure Neg Hx    Hyperlipidemia Neg Hx  Past Medical History:  Diagnosis Date   AICD (automatic cardioverter/defibrillator) present    Aortic atherosclerosis (HCC)    Asthma    Chronic respiratory failure (HCC)    CKD (chronic kidney disease), stage III (HCC)    COPD (chronic obstructive pulmonary disease) (HCC)    Deafness in right ear    Diabetes mellitus without complication (HCC)    Dyspnea    GERD (gastroesophageal reflux disease)    Gout    HFrEF (heart failure with reduced ejection fraction) (HCC)    a.) TTE 12/10/14: EF 25%; diff inf HK; sev LV and mod LA dil; LVH. b.) TTE 06/23/18: EF 20-25%; LVH; mild LV dil, mild BAE. c.) TTE 03/01/19: EF 15%, LVH, BAE; triv PR/TR. d.) TTE 02/27/20: EF < 20%; sev LV dil; mild MR. e.) TTE 11/23/20: EF < 20%; glob HK; sev LV dil; LVH; mild-mod BAE; mod-sev TR, triv AR; G1DD. f.)  TTE 05/14/21: EF <15%; LVH; sev LA and mild RV enlar; triv AR/PR, mild TR, mod MR.   Hiatal hernia    History of cardiac catheterization    a.) R/LHC 12/14/2009: normal coronaries. b.) R/LHC 12/11/2014: normal coronaries.   Hyperlipidemia    Hypertension    Hypoxemia    LBBB (left bundle branch block)    NICM (nonischemic cardiomyopathy; dilated cardiomyopathy) (HCC)    a.) R/LHC 12/14/2009: normal cors; LVEDP 18 mmHg, mean PA 29 mmHg, mean PCWP 31 mmHg; CO 6 L/min; CI 2.54 L/min/m. b.) TTE 12/10/2014: EF 25%. c.) R/LHC 12/11/2014: mean RA 9 mmHg, mean PA 22 mmHg, mean PCWP 22 mmHg. d.) TTE 06/23/2018: EF 20-25%. e.) TTE 03/01/2019: EF 15%. f.) TTE 02/27/2020: < 20%. g.) TTE 11/23/2020: EF <20%. h.) TTE 05/14/2021: < 15%.   NSTEMI (non-ST elevated myocardial infarction) (HCC)    a.) x 3 per patient report ---> 2012, 2014, 2016   Obesity    On supplemental oxygen by nasal cannula    a.) 2-3 L/[place] PRN   OSA on CPAP    PAF (paroxysmal atrial fibrillation) (HCC)    a.) CHA2DS2-VASc = 4 (HFrEF, HTN, prior MI, T2DM). b.) rate/rhythm maintained without pharmacologial intervention; no current anticoagulation.   Pancreatitis    PSVT (paroxysmal supraventricular tachycardia)     Current Outpatient Medications  Medication Sig Dispense Refill   acetaminophen (TYLENOL) 500 MG tablet Take 1,000 mg by mouth every 6 (six) hours as needed for moderate pain or mild pain.     allopurinol (ZYLOPRIM) 100 MG tablet Take by mouth.     atorvastatin (LIPITOR) 40 MG tablet Take 40 mg by mouth daily.     clindamycin (CLEOCIN-T) 1 % lotion Apply to affected areas daily after shower. 60 mL 3   colchicine 0.6 MG tablet Take 0.6 mg by mouth as needed (gout).     ENTRESTO 24-26 MG Take 1 tablet by mouth 2 (two) times daily.     JARDIANCE 10 MG TABS tablet Take 10 mg by mouth daily.     ketoconazole (NIZORAL) 2 % cream Apply twice daily to neck until clear 60 g 2   meloxicam (MOBIC) 15 MG tablet Take 1 tablet (15 mg  total) by mouth daily. 30 tablet 0   methylPREDNISolone (MEDROL DOSEPAK) 4 MG TBPK tablet Take as directed 21 each 0   methylPREDNISolone (MEDROL DOSEPAK) 4 MG TBPK tablet Take as directed 21 each 0   metoprolol succinate (TOPROL XL) 25 MG 24 hr tablet Take 1 tablet (25 mg total) by mouth daily. 30 tablet  5   mupirocin ointment (BACTROBAN) 2 % Apply topically 3 (three) times daily.     NON FORMULARY Pt uses a cpap nightly     omeprazole (PRILOSEC) 20 MG capsule Take 20 mg by mouth daily.     Omeprazole 20 MG TBDD Take 20 mg by mouth daily.     tazarotene (AVAGE) 0.1 % cream Apply topically at bedtime. 60 g 3   torsemide (DEMADEX) 20 MG tablet Take 40 mg by mouth daily.     umeclidinium-vilanterol (ANORO ELLIPTA) 62.5-25 MCG/ACT AEPB Inhale 1 puff into the lungs daily. 60 each 6   VENTOLIN HFA 108 (90 Base) MCG/ACT inhaler Inhale 2 puffs into the lungs every 6 (six) hours as needed for shortness of breath.     Vitamin D, Ergocalciferol, (DRISDOL) 1.25 MG (50000 UNIT) CAPS capsule Take 50,000 Units by mouth once a week.     No current facility-administered medications for this visit.      PHYSICAL EXAM:  General:  Well appearing. No resp difficulty HEENT: normal Neck: supple. JVP flat. Carotids 2+ bilaterally; no bruits. No lymphadenopathy or thryomegaly appreciated. Cor: PMI normal. Regular rate & rhythm. No rubs, gallops or murmurs. Lungs: clear Abdomen: soft, nontender, nondistended. No hepatosplenomegaly. No bruits or masses. Good bowel sounds. Extremities: no cyanosis, clubbing, rash, edema Neuro: alert & orientedx3, cranial nerves grossly intact. Moves all 4 extremities w/o difficulty. Affect pleasant.   ECG:   ASSESSMENT & PLAN:  1: Chronic heart failure with reduced ejection fraction- - NYHA class II - euvolemic - weighing daily; Reminded to call for an overnight weight gain of >2 pounds or a weekly weight gain of >5 pounds.  - weight down 8 pounds from last visit here 1  month ago - echo 10/20/22: EF <20% along with mildly elevated PA pressure and mild MR. Echo 11/23/20: EF of <20% along with moderate LVH and moderately elevated PA pressure. Echo 02/27/20: EF of <20%. Echo 03/01/2019: EF of 15% along with mild MR. Echo 06/23/18: EF of 20-25% along with mild MR. Echo 02/06/15: EF of 25% which is unchanged from previous echo on 12/10/14 which showed an EF of 25% and mild MR.  - Stress test 03/01/2019: Abnormal myocardial perfusion scan no clear evidence of  reversible ischemia inferior persistent defect either scar versus body  habitus artifact severely dilated left ventricular function severely  depressed LV function with ejection fraction of around 11%.  Recommend  medical therapy  - AICD (2016) in place but denies any shocks "in a long time" - jardiance 10mg  daily; hasn't taken this since pancreatitis flare - entresto 24/26mg  BID - metoprolol succinate 25mg  daily - torsemide 40mg  QD - in the past, spironolactone has caused hypotension - echo results reviewed w/ patient and EF continues to be low; refer to [facility] MD for further evaluation/ work-up; scheduled for 11/20/22 - BNP 12/26/20 was 136.9  2: HTN with CKD- - BP 94/66 - sees PCP (Mancheno) at [facility] 11/22/22 - BMP from 11/10/22 reviewed and showed sodium 138, potassium 3.6, Cr 1.8 and GFR 46 - saw nephrology (Korrapati) 10/07/22  3: Obstructive sleep apnea- - wears nocturnal ventilator QHS, 8 hours/night; reports sleeping better - appears to be benefitting from this therapy - continues wearing oxygen at 3L at bedtime & PRN during the day - saw pulmonology Jayme Cloud) 04/23/22  4: Diabetes- - A1c from 07/30/22 was 7.4% - home glucose this morning was 124  5: Tobacco use- - smokes 1/2 ppd of cigarettes -  complete cessation discussed for 3 minutes  6: Atrial fibrillation- - RRR in office today - saw cardiology Cliffton Asters) 01/30/22   Return next week, sooner if needed.

## 2023-03-23 ENCOUNTER — Ambulatory Visit (HOSPITAL_BASED_OUTPATIENT_CLINIC_OR_DEPARTMENT_OTHER): Payer: 59 | Admitting: Family

## 2023-03-23 ENCOUNTER — Other Ambulatory Visit
Admission: RE | Admit: 2023-03-23 | Discharge: 2023-03-23 | Disposition: A | Discharge status: Home / Self Care | Payer: 59 | Source: Ambulatory Visit | Attending: Family | Admitting: Family

## 2023-03-23 VITALS — BP 89/67 | HR 82 | Ht 65.0 in | Wt 291.0 lb

## 2023-03-23 DIAGNOSIS — I480 Paroxysmal atrial fibrillation: Secondary | ICD-10-CM | POA: Diagnosis not present

## 2023-03-23 DIAGNOSIS — I10 Essential (primary) hypertension: Secondary | ICD-10-CM | POA: Diagnosis not present

## 2023-03-23 DIAGNOSIS — I5022 Chronic systolic (congestive) heart failure: Secondary | ICD-10-CM

## 2023-03-23 DIAGNOSIS — G4733 Obstructive sleep apnea (adult) (pediatric): Secondary | ICD-10-CM | POA: Diagnosis not present

## 2023-03-23 DIAGNOSIS — E119 Type 2 diabetes mellitus without complications: Secondary | ICD-10-CM | POA: Diagnosis not present

## 2023-03-23 DIAGNOSIS — Z720 Tobacco use: Secondary | ICD-10-CM

## 2023-03-23 DIAGNOSIS — Z794 Long term (current) use of insulin: Secondary | ICD-10-CM

## 2023-03-23 LAB — BASIC METABOLIC PANEL
Anion gap: 8 (ref 5–15)
BUN: 37 mg/dL — ABNORMAL HIGH (ref 6–20)
CO2: 29 mmol/L (ref 22–32)
Calcium: 9 mg/dL (ref 8.9–10.3)
Chloride: 98 mmol/L (ref 98–111)
Creatinine, Ser: 1.61 mg/dL — ABNORMAL HIGH (ref 0.61–1.24)
GFR, Estimated: 53 mL/min — ABNORMAL LOW (ref >60)
Glucose, Bld: 161 mg/dL — ABNORMAL HIGH (ref 70–99)
Potassium: 4.7 mmol/L (ref 3.5–5.1)
Sodium: 135 mmol/L (ref 135–145)

## 2023-03-23 NOTE — Patient Instructions (Signed)
Decrease torsemide to 1 tablet every morning with additional tablet if you need it for weight gain, swelling or worsening shortness of breath.   Hold entresto today and tomorrow. Resume this on Wednesday if your blood pressure is > 100/60

## 2023-03-24 ENCOUNTER — Encounter: Payer: Self-pay | Admitting: Family

## 2023-03-31 ENCOUNTER — Encounter: Payer: Medicare Other | Admitting: Dermatology

## 2023-04-07 NOTE — Progress Notes (Deleted)
PCP: Presley Raddle, MD (last seen 03/24) Primary Cardiologist: Dorothyann Peng, MD (last seen 04/24) HF provider: Cheral Bay, MD (last seen 05/24)  HPI:  Oscar [name] is a 47 y/o male with a history of obstructive sleep apnea (currently with CPAP), MI, HTN, hyperlipidemia, GERD, DM, SVT,asthma, chronic LBBB and chronic heart failure with CRT-D (2016).  Has not been admitted or been in the ED in the last 6 months.   Echo 10/20/22: EF <20% along with mildly elevated PA pressure and mild Oscar.  Echo 11/23/20: EF of <20% along with moderate LVH and moderately elevated PA pressure.  Echo 02/27/20: EF of <20%.  Echo 03/01/2019: EF of 15% along with mild Oscar.  Echo 06/23/18: EF of 20-25% along with mild Oscar.  Echo 02/06/15: EF of 25% which is unchanged from previous echo on 12/10/14 which showed an EF of 25% and mild Oscar.   Stress test 03/01/2019: Abnormal myocardial perfusion scan no clear evidence of  reversible ischemia inferior persistent defect either scar versus body  habitus artifact severely dilated left ventricular function severely  depressed LV function with ejection fraction of around 11%.  Recommend  medical therapy   He presents today for a HF follow-up visit with a chief complaint of   At last visit, entresto was held for a couple of days just to hypotension.    Saw Dr Edwena Blow at the [facility] 05/24 and referral was made to Dr. Maisie Fus who did his original device in 2016 to see about doing a CRT upgrade.    ROS: All systems negative except as listed in HPI, PMH and Problem List.  SH:  Social History   Socioeconomic History   Marital status: Single    Spouse name: Not on file   Number of children: Not on file   Years of education: Not on file   Highest education level: Not on file  Occupational History   Occupation: unemployed  Tobacco Use   Smoking status: Every Day    Current packs/day: 1.50    Average packs/day: 1.5 packs/day for 34.0 years (51.0 ttl pk-yrs)     Types: Cigarettes    Passive exposure: Past   Smokeless tobacco: Never   Tobacco comments:    1/2 ppd Vernie Murders, [facility] 04/23/22   Vaping Use   Vaping status: Never Used  Substance and Sexual Activity   Alcohol use: No   Drug use: No   Sexual activity: Yes  Other Topics Concern   Not on file  Social History Narrative   Live alone   Social Determinants of Health   Financial Resource Strain: Not on file  Food Insecurity: Not on file  Transportation Needs: Not on file  Physical Activity: Not on file  Stress: Not on file  Social Connections: Not on file  Intimate Partner Violence: Not on file    FH:  Family History  Problem Relation Age of Onset   Hypertension Mother    Congestive Heart Failure Mother    Hypertension Sister    Diabetes Sister    Pancreatitis Sister    COPD Sister    Emphysema Sister        smoked   Pancreatitis Brother    Anemia Neg Hx    Arrhythmia Neg Hx    Asthma Neg Hx    Clotting disorder Neg Hx    Fainting Neg Hx    Heart attack Neg Hx    Heart disease Neg Hx    Heart failure Neg Hx  Hyperlipidemia Neg Hx     Past Medical History:  Diagnosis Date   AICD (automatic cardioverter/defibrillator) present    Aortic atherosclerosis (HCC)    Asthma    Chronic respiratory failure (HCC)    CKD (chronic kidney disease), stage III (HCC)    COPD (chronic obstructive pulmonary disease) (HCC)    Deafness in right ear    Diabetes mellitus without complication (HCC)    Dyspnea    GERD (gastroesophageal reflux disease)    Gout    HFrEF (heart failure with reduced ejection fraction) (HCC)    a.) TTE 12/10/14: EF 25%; diff inf HK; sev LV and mod LA dil; LVH. b.) TTE 06/23/18: EF 20-25%; LVH; mild LV dil, mild BAE. c.) TTE 03/01/19: EF 15%, LVH, BAE; triv PR/TR. d.) TTE 02/27/20: EF < 20%; sev LV dil; mild Oscar. e.) TTE 11/23/20: EF < 20%; glob HK; sev LV dil; LVH; mild-mod BAE; mod-sev TR, triv AR; G1DD. f.) TTE 05/14/21: EF <15%; LVH; sev LA and mild RV  enlar; triv AR/PR, mild TR, mod Oscar.   Hiatal hernia    History of cardiac catheterization    a.) R/LHC 12/14/2009: normal coronaries. b.) R/LHC 12/11/2014: normal coronaries.   Hyperlipidemia    Hypertension    Hypoxemia    LBBB (left bundle branch block)    NICM (nonischemic cardiomyopathy; dilated cardiomyopathy) (HCC)    a.) R/LHC 12/14/2009: normal cors; LVEDP 18 mmHg, mean PA 29 mmHg, mean PCWP 31 mmHg; CO 6 L/min; CI 2.54 L/min/m. b.) TTE 12/10/2014: EF 25%. c.) R/LHC 12/11/2014: mean RA 9 mmHg, mean PA 22 mmHg, mean PCWP 22 mmHg. d.) TTE 06/23/2018: EF 20-25%. e.) TTE 03/01/2019: EF 15%. f.) TTE 02/27/2020: < 20%. g.) TTE 11/23/2020: EF <20%. h.) TTE 05/14/2021: < 15%.   NSTEMI (non-ST elevated myocardial infarction) (HCC)    a.) x 3 per patient report ---> 2012, 2014, 2016   Obesity    On supplemental oxygen by nasal cannula    a.) 2-3 L/[place] PRN   OSA on CPAP    PAF (paroxysmal atrial fibrillation) (HCC)    a.) CHA2DS2-VASc = 4 (HFrEF, HTN, prior MI, T2DM). b.) rate/rhythm maintained without pharmacologial intervention; no current anticoagulation.   Pancreatitis    PSVT (paroxysmal supraventricular tachycardia)     Current Outpatient Medications  Medication Sig Dispense Refill   acetaminophen (TYLENOL) 500 MG tablet Take 1,000 mg by mouth every 6 (six) hours as needed for moderate pain or mild pain.     allopurinol (ZYLOPRIM) 100 MG tablet Take by mouth.     atorvastatin (LIPITOR) 40 MG tablet Take 40 mg by mouth daily.     clindamycin (CLEOCIN-T) 1 % lotion Apply to affected areas daily after shower. 60 mL 3   colchicine 0.6 MG tablet Take 0.6 mg by mouth as needed (gout).     ENTRESTO 24-26 MG Take 1 tablet by mouth 2 (two) times daily.     JARDIANCE 10 MG TABS tablet Take 10 mg by mouth daily.     ketoconazole (NIZORAL) 2 % cream Apply twice daily to neck until clear 60 g 2   meloxicam (MOBIC) 15 MG tablet Take 1 tablet (15 mg total) by mouth daily. 30 tablet 0    methylPREDNISolone (MEDROL DOSEPAK) 4 MG TBPK tablet Take as directed 21 each 0   metoprolol succinate (TOPROL XL) 25 MG 24 hr tablet Take 1 tablet (25 mg total) by mouth daily. 30 tablet 5   mupirocin ointment (BACTROBAN) 2 %  Apply topically 3 (three) times daily.     NON FORMULARY Pt uses a cpap nightly     omeprazole (PRILOSEC) 20 MG capsule Take 20 mg by mouth daily.     Omeprazole 20 MG TBDD Take 20 mg by mouth daily. (Patient not taking: Reported on 03/23/2023)     tazarotene (AVAGE) 0.1 % cream Apply topically at bedtime. 60 g 3   torsemide (DEMADEX) 20 MG tablet Take 40 mg by mouth daily.     umeclidinium-vilanterol (ANORO ELLIPTA) 62.5-25 MCG/ACT AEPB Inhale 1 puff into the lungs daily. 60 each 6   VENTOLIN HFA 108 (90 Base) MCG/ACT inhaler Inhale 2 puffs into the lungs every 6 (six) hours as needed for shortness of breath.     Vitamin D, Ergocalciferol, (DRISDOL) 1.25 MG (50000 UNIT) CAPS capsule Take 50,000 Units by mouth once a week.     No current facility-administered medications for this visit.     PHYSICAL EXAM:  General:  Well appearing. No resp difficulty HEENT: normal Neck: supple. JVP flat. Carotids 2+ bilaterally; no bruits. No lymphadenopathy or thryomegaly appreciated. Cor: PMI normal. Regular rate & rhythm. No rubs, gallops or murmurs. Lungs: clear Abdomen: soft, nontender, nondistended. No hepatosplenomegaly. No bruits or masses. Good bowel sounds. Extremities: no cyanosis, clubbing, rash, edema Neuro: alert & oriented x3, cranial nerves grossly intact. Moves all 4 extremities w/o difficulty. Affect pleasant.   ECG: 03/23/23 showed NSR with HR 89 w/ LBBB   ASSESSMENT & PLAN:  1: Chronic heart failure with reduced ejection fraction- - NYHA class III - euvolemic - weighing daily; Reminded to call for an overnight weight gain of >2 pounds or a weekly weight gain of >5 pounds.  - weight 291 pounds from last visit here 2 weeks ago - echo 10/20/22: EF <20% along  with mildly elevated PA pressure and mild Oscar.  - Echo 11/23/20: EF of <20% along with moderate LVH and moderately elevated PA pressure.  - Echo 02/27/20: EF of <20%.  - Echo 03/01/2019: EF of 15% along with mild Oscar.  - Echo 06/23/18: EF of 20-25% along with mild Oscar.  - Echo 02/06/15: EF of 25% which is unchanged from previous echo on 12/10/14 which showed an EF of 25% and mild Oscar.  - Stress test 03/01/2019: Abnormal myocardial perfusion scan no clear evidence of  reversible ischemia inferior persistent defect either scar versus body  habitus artifact severely dilated left ventricular function severely  depressed LV function with ejection fraction of around 11%.  Recommend  medical therapy  - AICD (2016) in place but denies any shocks "in a long time"; referral has been made by [facility] provider back to Dr. Maisie Fus - continue jardiance 10mg  daily - continue entresto 24/26mg  BID - continue metoprolol succinate 25mg  daily - continue torsemide 40mg  daily; if BP remains low, may need to decrease this to 20mg  daily - in the past, spironolactone has caused hypotension - saw HF provider Edwena Blow) 05/24 - BNP 12/26/20 was 136.9  2: HTN with CKD- - BP  - sees PCP (Mancheno) at [facility] 03/24 - BMP 03/23/23 reviewed and showed sodium 135, potassium 4.7, Cr 1.61 and GFR 53 - saw nephrology Suezanne Jacquet) 06/24  3: Obstructive sleep apnea- - wears nocturnal ventilator QHS, 8 hours/night; reports sleeping better - is now feeling more fatigued during the day but has referral regarding his device - continues wearing oxygen at 3L at bedtime & PRN during the day - saw pulmonology Jayme Cloud) 04/23/22  4: Diabetes- - A1c 02/12/23 was 7.3% - continue jardiance 10mg  daily  5: Tobacco use- - smokes 1/2 ppd of cigarettes - complete cessation discussed for 3 minutes  6: Atrial fibrillation- - saw cardiology Juliann Pares) 04/24

## 2023-04-08 ENCOUNTER — Encounter: Payer: 59 | Admitting: Family

## 2023-04-08 ENCOUNTER — Telehealth: Payer: Self-pay | Admitting: Family

## 2023-04-08 NOTE — Telephone Encounter (Signed)
Patient did not show for his [facility] appointment on 04/08/23. This is the 10th appt he has missed.

## 2023-04-27 ENCOUNTER — Encounter: Payer: Self-pay | Admitting: Family

## 2023-04-27 ENCOUNTER — Encounter: Payer: Self-pay | Admitting: Emergency Medicine

## 2023-04-27 ENCOUNTER — Other Ambulatory Visit: Payer: Self-pay

## 2023-04-27 ENCOUNTER — Emergency Department
Admission: EM | Admit: 2023-04-27 | Discharge: 2023-04-27 | Disposition: A | Discharge status: Home / Self Care | Payer: 59 | Attending: Emergency Medicine | Admitting: Emergency Medicine

## 2023-04-27 ENCOUNTER — Encounter: Payer: Self-pay | Admitting: Pharmacist

## 2023-04-27 ENCOUNTER — Ambulatory Visit (HOSPITAL_BASED_OUTPATIENT_CLINIC_OR_DEPARTMENT_OTHER): Payer: 59 | Admitting: Family

## 2023-04-27 ENCOUNTER — Emergency Department: Payer: 59

## 2023-04-27 VITALS — BP 99/67 | HR 88 | Resp 16 | Wt 289.5 lb

## 2023-04-27 DIAGNOSIS — R002 Palpitations: Secondary | ICD-10-CM | POA: Insufficient documentation

## 2023-04-27 DIAGNOSIS — Z6841 Body Mass Index (BMI) 40.0 and over, adult: Secondary | ICD-10-CM | POA: Diagnosis not present

## 2023-04-27 DIAGNOSIS — Z95810 Presence of automatic (implantable) cardiac defibrillator: Secondary | ICD-10-CM | POA: Insufficient documentation

## 2023-04-27 DIAGNOSIS — F17210 Nicotine dependence, cigarettes, uncomplicated: Secondary | ICD-10-CM | POA: Insufficient documentation

## 2023-04-27 DIAGNOSIS — I480 Paroxysmal atrial fibrillation: Secondary | ICD-10-CM | POA: Insufficient documentation

## 2023-04-27 DIAGNOSIS — I130 Hypertensive heart and chronic kidney disease with heart failure and stage 1 through stage 4 chronic kidney disease, or unspecified chronic kidney disease: Secondary | ICD-10-CM | POA: Insufficient documentation

## 2023-04-27 DIAGNOSIS — Z79899 Other long term (current) drug therapy: Secondary | ICD-10-CM | POA: Insufficient documentation

## 2023-04-27 DIAGNOSIS — I509 Heart failure, unspecified: Secondary | ICD-10-CM | POA: Insufficient documentation

## 2023-04-27 DIAGNOSIS — K219 Gastro-esophageal reflux disease without esophagitis: Secondary | ICD-10-CM | POA: Insufficient documentation

## 2023-04-27 DIAGNOSIS — E785 Hyperlipidemia, unspecified: Secondary | ICD-10-CM | POA: Insufficient documentation

## 2023-04-27 DIAGNOSIS — L02214 Cutaneous abscess of groin: Secondary | ICD-10-CM | POA: Insufficient documentation

## 2023-04-27 DIAGNOSIS — I471 Supraventricular tachycardia, unspecified: Secondary | ICD-10-CM | POA: Insufficient documentation

## 2023-04-27 DIAGNOSIS — L0291 Cutaneous abscess, unspecified: Secondary | ICD-10-CM

## 2023-04-27 DIAGNOSIS — J4489 Other specified chronic obstructive pulmonary disease: Secondary | ICD-10-CM | POA: Insufficient documentation

## 2023-04-27 DIAGNOSIS — I5022 Chronic systolic (congestive) heart failure: Secondary | ICD-10-CM | POA: Insufficient documentation

## 2023-04-27 DIAGNOSIS — N183 Chronic kidney disease, stage 3 unspecified: Secondary | ICD-10-CM | POA: Insufficient documentation

## 2023-04-27 DIAGNOSIS — Z8249 Family history of ischemic heart disease and other diseases of the circulatory system: Secondary | ICD-10-CM | POA: Diagnosis not present

## 2023-04-27 DIAGNOSIS — Z833 Family history of diabetes mellitus: Secondary | ICD-10-CM | POA: Diagnosis not present

## 2023-04-27 DIAGNOSIS — I447 Left bundle-branch block, unspecified: Secondary | ICD-10-CM | POA: Insufficient documentation

## 2023-04-27 DIAGNOSIS — Z7984 Long term (current) use of oral hypoglycemic drugs: Secondary | ICD-10-CM | POA: Insufficient documentation

## 2023-04-27 DIAGNOSIS — R0602 Shortness of breath: Secondary | ICD-10-CM | POA: Insufficient documentation

## 2023-04-27 DIAGNOSIS — E1122 Type 2 diabetes mellitus with diabetic chronic kidney disease: Secondary | ICD-10-CM | POA: Insufficient documentation

## 2023-04-27 DIAGNOSIS — I252 Old myocardial infarction: Secondary | ICD-10-CM | POA: Insufficient documentation

## 2023-04-27 DIAGNOSIS — G4733 Obstructive sleep apnea (adult) (pediatric): Secondary | ICD-10-CM | POA: Insufficient documentation

## 2023-04-27 DIAGNOSIS — B9689 Other specified bacterial agents as the cause of diseases classified elsewhere: Secondary | ICD-10-CM | POA: Insufficient documentation

## 2023-04-27 DIAGNOSIS — E119 Type 2 diabetes mellitus without complications: Secondary | ICD-10-CM

## 2023-04-27 DIAGNOSIS — E669 Obesity, unspecified: Secondary | ICD-10-CM | POA: Insufficient documentation

## 2023-04-27 DIAGNOSIS — Z720 Tobacco use: Secondary | ICD-10-CM

## 2023-04-27 DIAGNOSIS — I10 Essential (primary) hypertension: Secondary | ICD-10-CM | POA: Diagnosis not present

## 2023-04-27 DIAGNOSIS — Z794 Long term (current) use of insulin: Secondary | ICD-10-CM

## 2023-04-27 DIAGNOSIS — N189 Chronic kidney disease, unspecified: Secondary | ICD-10-CM | POA: Insufficient documentation

## 2023-04-27 LAB — CBC WITH DIFFERENTIAL/PLATELET
Abs Immature Granulocytes: 0.01 10*3/uL (ref 0.00–0.07)
Basophils Absolute: 0 10*3/uL (ref 0.0–0.1)
Basophils Relative: 0 %
Eosinophils Absolute: 0.1 10*3/uL (ref 0.0–0.5)
Eosinophils Relative: 3 %
HCT: 41.4 % (ref 39.0–52.0)
Hemoglobin: 14.4 g/dL (ref 13.0–17.0)
Immature Granulocytes: 0 %
Lymphocytes Relative: 37 %
Lymphs Abs: 1.8 10*3/uL (ref 0.7–4.0)
MCH: 33.1 pg (ref 26.0–34.0)
MCHC: 34.8 g/dL (ref 30.0–36.0)
MCV: 95.2 fL (ref 80.0–100.0)
Monocytes Absolute: 0.4 10*3/uL (ref 0.1–1.0)
Monocytes Relative: 9 %
Neutro Abs: 2.6 10*3/uL (ref 1.7–7.7)
Neutrophils Relative %: 51 %
Platelets: 142 10*3/uL — ABNORMAL LOW (ref 150–400)
RBC: 4.35 MIL/uL (ref 4.22–5.81)
RDW: 12 % (ref 11.5–15.5)
WBC: 5 10*3/uL (ref 4.0–10.5)
nRBC: 0 % (ref 0.0–0.2)

## 2023-04-27 LAB — COMPREHENSIVE METABOLIC PANEL
ALT: 28 U/L (ref 0–44)
AST: 27 U/L (ref 15–41)
Albumin: 3.7 g/dL (ref 3.5–5.0)
Alkaline Phosphatase: 153 U/L — ABNORMAL HIGH (ref 38–126)
Anion gap: 10 (ref 5–15)
BUN: 39 mg/dL — ABNORMAL HIGH (ref 6–20)
CO2: 29 mmol/L (ref 22–32)
Calcium: 8.7 mg/dL — ABNORMAL LOW (ref 8.9–10.3)
Chloride: 98 mmol/L (ref 98–111)
Creatinine, Ser: 1.76 mg/dL — ABNORMAL HIGH (ref 0.61–1.24)
GFR, Estimated: 47 mL/min — ABNORMAL LOW (ref >60)
Glucose, Bld: 123 mg/dL — ABNORMAL HIGH (ref 70–99)
Potassium: 3.7 mmol/L (ref 3.5–5.1)
Sodium: 137 mmol/L (ref 135–145)
Total Bilirubin: 0.6 mg/dL (ref 0.3–1.2)
Total Protein: 8.2 g/dL — ABNORMAL HIGH (ref 6.5–8.1)

## 2023-04-27 MED ORDER — CEPHALEXIN 500 MG PO CAPS: 500.0000 mg | ORAL_CAPSULE | Freq: Four times a day (QID) | ORAL | 0 refills | Status: AC

## 2023-04-27 MED ORDER — HYDROCODONE-ACETAMINOPHEN 5-325 MG PO TABS: 1.0000 | ORAL_TABLET | Freq: Four times a day (QID) | ORAL | 0 refills | Status: AC | PRN

## 2023-04-27 NOTE — ED Provider Notes (Signed)
[facility] Emergency Department Provider Note     Event Date/Time   First MD Initiated Contact with Patient 04/27/23 1109     (approximate)   History   Abscess   HPI  Oscar [name] is a 48 y.o. male with a history of HTN, CHF, diabetes and obesity presents to the ED for complaint of multiple abscess in the groin area x 1 month.  Progressively increasing in size and pain.  Endorses drainage.  Patient has had this abscess before. It had completley resolved but has now returned in addition to others.  Denies fever and chills.   Physical Exam   Triage Vital Signs: ED Triage Vitals  Encounter Vitals Group     BP 04/27/23 1019 106/89     Systolic BP Percentile --      Diastolic BP Percentile --      Pulse Rate 04/27/23 1019 86     Resp 04/27/23 1019 18     Temp 04/27/23 1019 97.8 F (36.6 C)     Temp Source 04/27/23 1019 Oral     SpO2 04/27/23 1019 99 %     Weight 04/27/23 1018 281 lb (127.5 kg)     Height 04/27/23 1018 5\' 6"  (1.676 m)     Head Circumference --      Peak Flow --      Pain Score 04/27/23 1018 3     Pain Loc --      Pain Education --      Exclude from Growth Chart --     Most recent vital signs: Vitals:   04/27/23 1019  BP: 106/89  Pulse: 86  Resp: 18  Temp: 97.8 F (36.6 C)  SpO2: 99%    General Well appearing. Awake, no distress.  HEENT NCAT. PERRL. EOMI. No rhinorrhea. Mucous membranes are moist.  CV:  Good peripheral perfusion. RRR  RESP:  Normal effort. LCTAB ABD:  No distention.  Other:  Groin area reveals multiple (3) medium sized firm lumps at the base of scrotum and right side perineum area.  Tender to palpation.  Warm.  Mild sanguineous drainage noted.   ED Results / Procedures / Treatments   Labs (all labs ordered are listed, but only abnormal results are displayed) Labs Reviewed  CBC WITH DIFFERENTIAL/PLATELET - Abnormal; Notable for the following components:      Result Value   Platelets 142 (*)     All other components within normal limits  COMPREHENSIVE METABOLIC PANEL - Abnormal; Notable for the following components:   Glucose, Bld 123 (*)    BUN 39 (*)    Creatinine, Ser 1.76 (*)    Calcium 8.7 (*)    Total Protein 8.2 (*)    Alkaline Phosphatase 153 (*)    GFR, Estimated 47 (*)    All other components within normal limits     RADIOLOGY  I personally viewed and evaluated these images as part of my medical decision making, as well as reviewing the written report by the radiologist.  ED Provider Interpretation: CT pelvis is unremarkable  CT PELVIS WO CONTRAST  Result Date: 04/27/2023 CLINICAL DATA:  Perianal abscess or fistula suspected EXAM: CT PELVIS WITHOUT CONTRAST TECHNIQUE: Multidetector CT imaging of the pelvis was performed following the standard protocol without intravenous contrast. RADIATION DOSE REDUCTION: This exam was performed according to the departmental dose-optimization program which includes automated exposure control, adjustment of the mA and/or kV according to patient size and/or use of iterative  reconstruction technique. COMPARISON:  11/10/2022 FINDINGS: Urinary Tract:  Urinary bladder incompletely distended Bowel:  Visualized loops of small bowel and colon are nondilated. Vascular/Lymphatic: Mild aortoiliac calcified plaque. No pelvic adenopathy. Reproductive:  Prostate unremarkable. Other: No perirectal abscess or fistula. No regional inflammatory changes. Musculoskeletal: Degenerative disc disease L5-S1. IMPRESSION: 1. Negative for perirectal abscess or fistula. 2.  Aortic Atherosclerosis (ICD10-I70.0). Electronically Signed   By: Corlis Leak M.D.   On: 04/27/2023 12:44    PROCEDURES:  Critical Care performed: No  Procedures  MEDICATIONS ORDERED IN ED: Medications - No data to display   IMPRESSION / MDM / ASSESSMENT AND PLAN / ED COURSE  I reviewed the triage vital signs and the nursing notes.                               46 y.o. male presents  to the emergency department for evaluation and treatment of recurrent abscess in the groin area. See HPI for further details.    Differential diagnosis includes, but is not limited to abscess, cyst, folliculitis, fistula, hemorrhoids.  Patient's presentation is most consistent with acute complicated illness / injury requiring diagnostic workup.  CBC and CMP are reassuring.  Given patient's allergy to contrast CT with contrast is contraindicated.  CT without of the pelvis revealed no evidence of perianal abscess or fistula.  Given physical exam findings stated above patient will need to follow-up with general surgery.  He has had an abscess in this area before in which he was completely under sedation for I&D.  Explained to patient this cannot be done in the ED and further evaluation and management under general surgery would be appropriate with this presentation.  Patient is prescribed with antibiotics in the interim and a short course of pain medication. Review of the [place] CSRS was performed in accordance of the [employer] prior to dispensing any controlled drugs. Patient is given ED precautions to return to the ED for any worsening or new symptoms. Patient verbalizes understanding. All questions and concerns were addressed during ED visit.    FINAL CLINICAL IMPRESSION(S) / ED DIAGNOSES   Final diagnoses:  Abscess   Rx / DC Orders   ED Discharge Orders          Ordered    HYDROcodone-acetaminophen (NORCO/VICODIN) 5-325 MG tablet  Every 6 hours PRN        04/27/23 1308    cephALEXin (KEFLEX) 500 MG capsule  4 times daily        04/27/23 1308             Note:  This document was prepared using Dragon voice recognition software and may include unintentional dictation errors.    Romeo Apple, Brittiney Dicostanzo A, PA-C 04/27/23 2208    Trinna Post, MD 04/28/23 [phone]

## 2023-04-27 NOTE — ED Triage Notes (Signed)
Patient to ED via POV for abscess to groin area. States abscess has been there x1 month. Hx of same.

## 2023-04-27 NOTE — ED Notes (Signed)
See triage notes. Patient stated he has a cyst in his groin area that popped up a month ago

## 2023-04-27 NOTE — Discharge Instructions (Addendum)
You were seen in the ED for multiple abscess beneath your scrotum.  Your labs were reassuring and CT does not show a perianal abscess.  You opted out of I&D given your history of needing complete sedation.  You will need to follow-up with surgery tomorrow for further evaluation and management.  Take antibiotics as directed

## 2023-04-27 NOTE — Patient Instructions (Signed)
Follow-up with [facility] but call us if you need to be seen for anything

## 2023-04-27 NOTE — Progress Notes (Signed)
PCP: Presley Raddle, MD (last seen 03/24) Primary Cardiologist: Dorothyann Peng, MD (last seen 04/24) HF provider: Cheral Bay, MD (last seen 05/24)  HPI:  Oscar [name] is a 47 y/o male with a history of obstructive sleep apnea (currently with CPAP), MI, HTN, hyperlipidemia, GERD, DM, SVT,asthma, chronic LBBB and chronic heart failure with CRT-D (2016).  Has not been admitted or been in the ED in the last 6 months.   Echo 02/06/15: EF of 25% which is unchanged from previous echo on 12/10/14 which showed an EF of 25% and mild Oscar. Echo 06/23/18: EF of 20-25% along with mild Oscar.  Echo 03/01/2019: EF of 15% along with mild Oscar.  Echo 02/27/20: EF of <20% Echo 11/23/20: EF of <20% along with moderate LVH and moderately elevated PA pressure. Echo 10/20/22: EF <20% along with mildly elevated PA pressure and mild Oscar.    Stress test 03/01/2019: Abnormal myocardial perfusion scan no clear evidence of reversible ischemia inferior persistent defect either scar versus body habitus artifact severely dilated left ventricular function severely depressed LV function with ejection fraction of around 11%. Recommend medical therapy   He presents today for a HF follow-up visit with a chief complaint of minimal SOB with moderate exertion. Chronic in nature. Has associated fatigue, occasional palpitations/ dizziness and groin abscess along with this. Says that the abscess has been present for ~ 1 month.     Saw Dr Edwena Blow at the [facility] 05/24 and referral was made to Dr. Maisie Fus who did his original device in 2016 to see about doing a CRT upgrade.   ROS: All systems negative except as listed in HPI, PMH and Problem List.  SH:  Social History   Socioeconomic History   Marital status: Single    Spouse name: Not on file   Number of children: Not on file   Years of education: Not on file   Highest education level: Not on file  Occupational History   Occupation: unemployed  Tobacco Use   Smoking status:  Every Day    Current packs/day: 1.50    Average packs/day: 1.5 packs/day for 34.0 years (51.0 ttl pk-yrs)    Types: Cigarettes    Passive exposure: Past   Smokeless tobacco: Never   Tobacco comments:    1/2 ppd Oscar [name], [facility] 04/23/22   Vaping Use   Vaping status: Never Used  Substance and Sexual Activity   Alcohol use: No   Drug use: No   Sexual activity: Yes  Other Topics Concern   Not on file  Social History Narrative   Live alone   Social Determinants of Health   Financial Resource Strain: Not on file  Food Insecurity: Not on file  Transportation Needs: Not on file  Physical Activity: Not on file  Stress: Not on file  Social Connections: Not on file  Intimate Partner Violence: Not on file    FH:  Family History  Problem Relation Age of Onset   Hypertension Mother    Congestive Heart Failure Mother    Hypertension Sister    Diabetes Sister    Pancreatitis Sister    COPD Sister    Emphysema Sister        smoked   Pancreatitis Brother    Anemia Neg Hx    Arrhythmia Neg Hx    Asthma Neg Hx    Clotting disorder Neg Hx    Fainting Neg Hx    Heart attack Neg Hx    Heart disease Neg  Hx    Heart failure Neg Hx    Hyperlipidemia Neg Hx     Past Medical History:  Diagnosis Date   AICD (automatic cardioverter/defibrillator) present    Aortic atherosclerosis (HCC)    Asthma    Chronic respiratory failure (HCC)    CKD (chronic kidney disease), stage III (HCC)    COPD (chronic obstructive pulmonary disease) (HCC)    Deafness in right ear    Diabetes mellitus without complication (HCC)    Dyspnea    GERD (gastroesophageal reflux disease)    Gout    HFrEF (heart failure with reduced ejection fraction) (HCC)    a.) TTE 12/10/14: EF 25%; diff inf HK; sev LV and mod LA dil; LVH. b.) TTE 06/23/18: EF 20-25%; LVH; mild LV dil, mild BAE. c.) TTE 03/01/19: EF 15%, LVH, BAE; triv PR/TR. d.) TTE 02/27/20: EF < 20%; sev LV dil; mild Oscar. e.) TTE 11/23/20: EF < 20%; glob HK;  sev LV dil; LVH; mild-mod BAE; mod-sev TR, triv AR; G1DD. f.) TTE 05/14/21: EF <15%; LVH; sev LA and mild RV enlar; triv AR/PR, mild TR, mod Oscar.   Hiatal hernia    History of cardiac catheterization    a.) R/LHC 12/14/2009: normal coronaries. b.) R/LHC 12/11/2014: normal coronaries.   Hyperlipidemia    Hypertension    Hypoxemia    LBBB (left bundle branch block)    NICM (nonischemic cardiomyopathy; dilated cardiomyopathy) (HCC)    a.) R/LHC 12/14/2009: normal cors; LVEDP 18 mmHg, mean PA 29 mmHg, mean PCWP 31 mmHg; CO 6 L/min; CI 2.54 L/min/m. b.) TTE 12/10/2014: EF 25%. c.) R/LHC 12/11/2014: mean RA 9 mmHg, mean PA 22 mmHg, mean PCWP 22 mmHg. d.) TTE 06/23/2018: EF 20-25%. e.) TTE 03/01/2019: EF 15%. f.) TTE 02/27/2020: < 20%. g.) TTE 11/23/2020: EF <20%. h.) TTE 05/14/2021: < 15%.   NSTEMI (non-ST elevated myocardial infarction) (HCC)    a.) x 3 per patient report ---> 2012, 2014, 2016   Obesity    On supplemental oxygen by nasal cannula    a.) 2-3 L/[place] PRN   OSA on CPAP    PAF (paroxysmal atrial fibrillation) (HCC)    a.) CHA2DS2-VASc = 4 (HFrEF, HTN, prior MI, T2DM). b.) rate/rhythm maintained without pharmacologial intervention; no current anticoagulation.   Pancreatitis    PSVT (paroxysmal supraventricular tachycardia)     Current Outpatient Medications  Medication Sig Dispense Refill   acetaminophen (TYLENOL) 500 MG tablet Take 1,000 mg by mouth every 6 (six) hours as needed for moderate pain or mild pain.     allopurinol (ZYLOPRIM) 100 MG tablet Take by mouth.     atorvastatin (LIPITOR) 40 MG tablet Take 40 mg by mouth daily.     clindamycin (CLEOCIN-T) 1 % lotion Apply to affected areas daily after shower. 60 mL 3   colchicine 0.6 MG tablet Take 0.6 mg by mouth as needed (gout).     ENTRESTO 24-26 MG Take 1 tablet by mouth 2 (two) times daily.     JARDIANCE 10 MG TABS tablet Take 10 mg by mouth daily.     ketoconazole (NIZORAL) 2 % cream Apply twice daily to neck until clear  60 g 2   meloxicam (MOBIC) 15 MG tablet Take 1 tablet (15 mg total) by mouth daily. 30 tablet 0   methylPREDNISolone (MEDROL DOSEPAK) 4 MG TBPK tablet Take as directed 21 each 0   metoprolol succinate (TOPROL XL) 25 MG 24 hr tablet Take 1 tablet (25 mg total) by mouth  daily. 30 tablet 5   mupirocin ointment (BACTROBAN) 2 % Apply topically 3 (three) times daily.     NON FORMULARY Pt uses a cpap nightly     omeprazole (PRILOSEC) 20 MG capsule Take 20 mg by mouth daily.     Omeprazole 20 MG TBDD Take 20 mg by mouth daily. (Patient not taking: Reported on 03/23/2023)     tazarotene (AVAGE) 0.1 % cream Apply topically at bedtime. 60 g 3   torsemide (DEMADEX) 20 MG tablet Take 40 mg by mouth daily.     umeclidinium-vilanterol (ANORO ELLIPTA) 62.5-25 MCG/ACT AEPB Inhale 1 puff into the lungs daily. 60 each 6   VENTOLIN HFA 108 (90 Base) MCG/ACT inhaler Inhale 2 puffs into the lungs every 6 (six) hours as needed for shortness of breath.     Vitamin D, Ergocalciferol, (DRISDOL) 1.25 MG (50000 UNIT) CAPS capsule Take 50,000 Units by mouth once a week.     No current facility-administered medications for this visit.   Vitals:   04/27/23 0941  BP: 99/67  Pulse: 88  Resp: 16  SpO2: 98%  Weight: 289 lb 8 oz (131.3 kg)   Wt Readings from Last 3 Encounters:  04/27/23 289 lb 8 oz (131.3 kg)  03/23/23 291 lb (132 kg)  12/22/22 283 lb 8 oz (128.6 kg)   Lab Results  Component Value Date   CREATININE 1.61 (H) 03/23/2023   CREATININE 1.80 (H) 11/10/2022   CREATININE 1.63 (H) 09/30/2022   PHYSICAL EXAM:  General:  Well appearing. No resp difficulty HEENT: normal Neck: supple. JVP flat. No lymphadenopathy or thryomegaly appreciated. Cor: PMI normal. Regular rate & rhythm. No rubs, gallops or murmurs. Lungs: clear Abdomen: soft, nontender, nondistended. No hepatosplenomegaly. No bruits or masses.  Extremities: no cyanosis, clubbing, rash, edema Neuro: alert & oriented x3, cranial nerves grossly  intact. Moves all 4 extremities w/o difficulty. Affect pleasant.   ECG: not done   ASSESSMENT & PLAN:  1: Chronic heart failure with reduced ejection fraction- - NYHA class II - euvolemic - weighing daily; Reminded to call for an overnight weight gain of >2 pounds or a weekly weight gain of >5 pounds.  - weight down 2 pounds from last visit here 1 month ago - Echo 02/06/15: EF of 25% which is unchanged from previous echo on 12/10/14 which showed an EF of 25% and mild Oscar. - Echo 06/23/18: EF of 20-25% along with mild Oscar.  - Echo 03/01/2019: EF of 15% along with mild Oscar.  - Echo 02/27/20: EF of <20% - Echo 11/23/20: EF of <20% along with moderate LVH and moderately elevated PA pressure. - Echo 10/20/22: EF <20% along with mildly elevated PA pressure and mild Oscar.  - Stress test 03/01/2019:   Abnormal myocardial perfusion scan no clear evidence of reversible ischemia inferior persistent defect either scar versus body habitus artifact severely dilated left ventricular function severely depressed LV function with ejection fraction of around 11%.   - AICD (2016) in place but denies any shocks "in a long time"; referral has been made by [facility] provider back to Dr. Maisie Fus - continue jardiance 10mg  daily - continue entresto 1/2 tablet of 24/26mg  BID due to hypotension - continue metoprolol succinate 25mg  daily - continue torsemide 40mg  daily - in the past, spironolactone has caused hypotension - saw HF provider Edwena Blow) 05/24 - explained that since he was now seeing the HF Clinic with [facility], there wasn't a reason to see both them and [place]; explained that we were  here if any weight gain/ increased symptoms occur and he can't get in to see his [facility] HF provider but that we wouldn't make another appointment at this time - he says that he's going to talk to Dr. Juliann Pares to discuss further about him continuing to come to the [facility] instead of seeing the [facility] HF provider - BNP 12/26/20 was 136.9 - PharmD  reconciled meds w/patient  2: HTN with CKD- - BP 99/67 - saw PCP Ephraim Hamburger) at [employer] [facility] 03/24 - BMP 03/23/23 reviewed and showed sodium 135, potassium 4.7, Cr 1.61 and GFR 53 - saw nephrology Suezanne Jacquet) 06/24  3: Obstructive sleep apnea- - wears nocturnal ventilator QHS, ~ 6-8 hours/night; reports sleeping better - continues wearing oxygen at 3L at bedtime & PRN during the day - saw pulmonology Jayme Cloud) 04/23/22  4: Diabetes- - A1c 02/12/23 was 7.3% - continue jardiance 10mg  daily  5: Tobacco use- - smokes 1/2 ppd of cigarettes - complete cessation discussed for 3 minutes  6: Atrial fibrillation- - saw cardiology Juliann Pares) 04/24  Touch base with PCP regarding month long abscess in groin. Return here for HF issues PRN

## 2023-04-30 ENCOUNTER — Ambulatory Visit: Payer: 59 | Admitting: Podiatry

## 2023-05-01 ENCOUNTER — Other Ambulatory Visit: Payer: Self-pay | Admitting: Podiatry

## 2023-05-06 ENCOUNTER — Encounter: Payer: Self-pay | Admitting: Surgery

## 2023-05-06 ENCOUNTER — Ambulatory Visit (INDEPENDENT_AMBULATORY_CARE_PROVIDER_SITE_OTHER): Payer: 59 | Admitting: Surgery

## 2023-05-06 VITALS — BP 117/76 | HR 74 | Temp 98.2°F | Ht 65.0 in | Wt 288.0 lb

## 2023-05-06 DIAGNOSIS — L732 Hidradenitis suppurativa: Secondary | ICD-10-CM

## 2023-05-06 DIAGNOSIS — Z412 Encounter for routine and ritual male circumcision: Secondary | ICD-10-CM

## 2023-05-06 MED ORDER — DOXYCYCLINE HYCLATE 100 MG PO CAPS: 200.0000 mg | ORAL_CAPSULE | Freq: Two times a day (BID) | ORAL | 0 refills | Status: AC

## 2023-05-06 NOTE — Patient Instructions (Addendum)
We are going to send in a new prescription for Doxycycline.   We will refer you to Urology. They will call you to schedule this appointment.   We recommend that you call [facility] to be seen for this condition.   Hidradenitis Suppurativa Hidradenitis suppurativa is a long-term (chronic) skin disease. It is similar to a severe form of acne, but it affects areas of the body where acne would be unusual, especially areas of the body where skin rubs against skin and becomes moist. These include: Underarms. Groin. Genital area. Buttocks. Upper thighs. Breasts. Hidradenitis suppurativa may start out as small lumps or pimples caused by blocked skin pores, sweat glands, or hair follicles. Pimples may develop into deep sores that break open (rupture) and drain pus. Over time, affected areas of skin may thicken and become scarred. This condition is rare and does not spread from person to person (non-contagious). What are the causes? The exact cause of this condition is not known. It may be related to: Male and male hormones. An overactive disease-fighting system (immune system). The immune system may over-react to blocked hair follicles or sweat glands and cause swelling and pus-filled sores. What increases the risk? You are more likely to develop this condition if you: Are male. Are 110-83 years old. Have a family history of hidradenitis suppurativa. Have a personal history of acne. Are overweight. Smoke. Take the medicine lithium. What are the signs or symptoms? The first symptoms are usually painful bumps in the skin, similar to pimples. The condition may get worse over time (progress), or it may only cause mild symptoms. If the disease progresses, symptoms may include: Skin bumps getting bigger and growing deeper into the skin. Bumps rupturing and draining pus. Itchy, infected skin. Skin getting thicker and scarred. Tunnels under the skin (fistulas) where pus drains from a  bump. Pain during daily activities, such as pain during walking if your groin area is affected. Emotional problems, such as stress or depression. This condition may affect your appearance and your ability or willingness to wear certain clothes or do certain activities. How is this diagnosed? This condition is diagnosed by a health care provider who specializes in skin conditions (dermatologist). You may be diagnosed based on: Your symptoms and medical history. A physical exam. Testing a pus sample for infection. Blood tests. How is this treated? Your treatment will depend on how severe your symptoms are. The same treatment will not work for everybody with this condition. You may need to try several treatments to find what works best for you. Treatment may include: Cleaning and bandaging (dressing) your wounds as needed. Lifestyle changes, such as new skin care routines. Taking medicines, such as: Antibiotics. Acne medicines. Medicines to reduce the activity of the immune system. A diabetes medicine (metformin). Birth control pills, for women. Steroids to reduce swelling and pain. Working with a mental health care provider, if you experience emotional distress due to this condition. If you have severe symptoms that do not get better with medicine, you may need surgery. Surgery may involve: Using a laser to clear the skin and remove hair follicles. Opening and draining deep sores. Removing the areas of skin that are diseased and scarred. Follow these instructions at home: Medicines  Take over-the-counter and prescription medicines only as told by your health care provider. If you were prescribed antibiotics, take them as told by your health care provider. Do not stop using the antibiotic even if your condition improves. Skin care If you have open  wounds, cover them with a clean dressing as told by your health care provider. Keep wounds clean by washing them gently with soap and water  when you bathe. Do not shave the areas where you get hidradenitis suppurativa. Wear loose-fitting clothes. Try to avoid getting overheated or sweaty. If you get sweaty or wet, change into clean, dry clothes as soon as you can. To help relieve pain and itchiness, cover sore areas with a warm, clean washcloth (warm compress) for 5-10 minutes as often as needed. Your healthcare provider may recommend an antiperspirant deodorant that may be gentle on your skin. A daily antiseptic wash to cleanse affected areas may be suggested by your healthcare provider. General instructions Learn as much as you can about your disease so that you have an active role in your treatment. Work closely with your health care provider to find treatments that work for you. If you are overweight, work with your health care provider to lose weight as recommended. Do not use any products that contain nicotine or tobacco. These products include cigarettes, chewing tobacco, and vaping devices, such as e-cigarettes. If you need help quitting, ask your health care provider. If you struggle with living with this condition, talk with your health care provider or work with a mental health care provider as recommended. Keep all follow-up visits. Where to find more information Hidradenitis Suppurativa Foundation, Inc.: www.hs-foundation.org American Academy of Dermatology: InfoExam.si Contact a health care provider if: You have a flare-up of hidradenitis suppurativa. You have a fever or chills. You have trouble controlling your symptoms at home. You have trouble doing your daily activities because of your symptoms. You have trouble dealing with emotional problems related to your condition. Summary Hidradenitis suppurativa is a long-term (chronic) skin disease. It is similar to a severe form of acne, but it affects areas of the body where acne would be unusual. The first symptoms are usually painful bumps in the skin, similar to  pimples. The condition may only cause mild symptoms, or it may get worse over time (progress). If you have open wounds, cover them with a clean dressing as told by your health care provider. Keep wounds clean by washing them gently with soap and water when you bathe. Besides skin care, treatment may include medicines, laser treatment, and surgery. This information is not intended to replace advice given to you by your health care provider. Make sure you discuss any questions you have with your health care provider. Document Revised: 10/09/2021 Document Reviewed: 10/09/2021 Elsevier Patient Education  2024 [employer].

## 2023-05-08 NOTE — Progress Notes (Signed)
Outpatient Surgical Follow Up  05/08/2023  Oscar [name] is an 47 y.o. male.   Chief Complaint  Patient presents with   Follow-up    HPI: 47 year old male well-known to me with multiple comorbidities including heart failure diabetes and morbid obesity now presents with peritoneal drainage.  He does have a history of hidradenitis.  Reports some discomfort and mild pain intermittent dull.  No fevers no chills.  No recent imaging. He did have cholecystectomy and umbilical hernia repair by me and is doing well without any evidence of complications or complaints.  Past Medical History:  Diagnosis Date   AICD (automatic cardioverter/defibrillator) present    Aortic atherosclerosis (HCC)    Asthma    Chronic respiratory failure (HCC)    CKD (chronic kidney disease), stage III (HCC)    COPD (chronic obstructive pulmonary disease) (HCC)    Deafness in right ear    Diabetes mellitus without complication (HCC)    Dyspnea    GERD (gastroesophageal reflux disease)    Gout    HFrEF (heart failure with reduced ejection fraction) (HCC)    a.) TTE 12/10/14: EF 25%; diff inf HK; sev LV and mod LA dil; LVH. b.) TTE 06/23/18: EF 20-25%; LVH; mild LV dil, mild BAE. c.) TTE 03/01/19: EF 15%, LVH, BAE; triv PR/TR. d.) TTE 02/27/20: EF < 20%; sev LV dil; mild MR. e.) TTE 11/23/20: EF < 20%; glob HK; sev LV dil; LVH; mild-mod BAE; mod-sev TR, triv AR; G1DD. f.) TTE 05/14/21: EF <15%; LVH; sev LA and mild RV enlar; triv AR/PR, mild TR, mod MR.   Hiatal hernia    History of cardiac catheterization    a.) R/LHC 12/14/2009: normal coronaries. b.) R/LHC 12/11/2014: normal coronaries.   Hyperlipidemia    Hypertension    Hypoxemia    LBBB (left bundle branch block)    NICM (nonischemic cardiomyopathy; dilated cardiomyopathy) (HCC)    a.) R/LHC 12/14/2009: normal cors; LVEDP 18 mmHg, mean PA 29 mmHg, mean PCWP 31 mmHg; CO 6 L/min; CI 2.54 L/min/m. b.) TTE 12/10/2014: EF 25%. c.) R/LHC 12/11/2014: mean RA 9 mmHg,  mean PA 22 mmHg, mean PCWP 22 mmHg. d.) TTE 06/23/2018: EF 20-25%. e.) TTE 03/01/2019: EF 15%. f.) TTE 02/27/2020: < 20%. g.) TTE 11/23/2020: EF <20%. h.) TTE 05/14/2021: < 15%.   NSTEMI (non-ST elevated myocardial infarction) (HCC)    a.) x 3 per patient report ---> 2012, 2014, 2016   Obesity    On supplemental oxygen by nasal cannula    a.) 2-3 L/[place] PRN   OSA on CPAP    PAF (paroxysmal atrial fibrillation) (HCC)    a.) CHA2DS2-VASc = 4 (HFrEF, HTN, prior MI, T2DM). b.) rate/rhythm maintained without pharmacologial intervention; no current anticoagulation.   Pancreatitis    PSVT (paroxysmal supraventricular tachycardia)     Past Surgical History:  Procedure Laterality Date   BIOPSY  11/25/2021   Procedure: BIOPSY;  Surgeon: Meridee Score Netty Starring., MD;  Location: [facility];  Service: Gastroenterology;;   CARDIAC CATHETERIZATION  12/11/2014   Procedure: RIGHT/LEFT HEART CATH AND CORONARY ANGIOGRAPHY;  Surgeon: Runell Gess, MD;  Location: [facility] CATH LAB;  Service: Cardiovascular;;   COLONOSCOPY WITH PROPOFOL N/A 11/13/2021   Procedure: COLONOSCOPY WITH PROPOFOL;  Surgeon: Toney Reil, MD;  Location: [facility] ENDOSCOPY;  Service: Gastroenterology;  Laterality: N/A;   ESOPHAGOGASTRODUODENOSCOPY (EGD) WITH PROPOFOL N/A 11/25/2021   Procedure: ESOPHAGOGASTRODUODENOSCOPY (EGD) WITH PROPOFOL;  Surgeon: Meridee Score Netty Starring., MD;  Location: [facility] ENDOSCOPY;  Service: Gastroenterology;  Laterality: N/A;   ICD LEAD REMOVAL N/A 03/30/2015   Procedure: ICD LEAD REMOVAL;  Surgeon: Sharion Settler, MD;  Location: [facility] ORS;  Service: Cardiovascular;  Laterality: N/A;   IMPLANTABLE CARDIOVERTER DEFIBRILLATOR IMPLANT     INCISION AND DRAINAGE ABSCESS N/A 12/12/2020   Procedure: INCISION AND DRAINAGE ABSCESS;  Surgeon: Leafy Ro, MD;  Location: [facility] ORS;  Service: General;  Laterality: N/A;   INSERT / REPLACE / REMOVE PACEMAKER     LEFT HEART CATHETERIZATION WITH CORONARY ANGIOGRAM N/A  12/09/2014   Procedure: LEFT HEART CATHETERIZATION WITH CORONARY ANGIOGRAM;  Surgeon: Kathleene Hazel, MD;  Location: [facility] CATH LAB;  Service: Cardiovascular;  Laterality: N/A;   RIGHT/LEFT HEART CATH AND CORONARY ANGIOGRAPHY Bilateral 12/14/2009   Procedure: RIGHT/LEFT HEART CATH AND CORONARY ANGIOGRAPHY; Location: [facility]; Surgeon: Rudean Hitt, MD   TONSILLECTOMY     as a child , adnoids removed   UPPER ESOPHAGEAL ENDOSCOPIC ULTRASOUND (EUS) N/A 11/25/2021   Procedure: UPPER ESOPHAGEAL ENDOSCOPIC ULTRASOUND (EUS);  Surgeon: Lemar Lofty., MD;  Location: [facility];  Service: Gastroenterology;  Laterality: N/A;    Family History  Problem Relation Age of Onset   Hypertension Mother    Congestive Heart Failure Mother    Hypertension Sister    Diabetes Sister    Pancreatitis Sister    COPD Sister    Emphysema Sister        smoked   Pancreatitis Brother    Anemia Neg Hx    Arrhythmia Neg Hx    Asthma Neg Hx    Clotting disorder Neg Hx    Fainting Neg Hx    Heart attack Neg Hx    Heart disease Neg Hx    Heart failure Neg Hx    Hyperlipidemia Neg Hx     Social History:  reports that he has been smoking cigarettes. He has a 51 pack-year smoking history. He has been exposed to tobacco smoke. He has never used smokeless tobacco. He reports that he does not drink alcohol and does not use drugs.  Allergies:  Allergies  Allergen Reactions   Ciprofloxacin Swelling and Other (See Comments)    Migraine Headache    Iodinated Contrast Media     Other reaction(s): Vomiting Projectile vomiting   Isosorb Dinitrate-Hydralazine Other (See Comments)    Migraine Headache    Medications reviewed.    ROS Full ROS performed and is otherwise negative other than what is stated in HPI   BP 117/76   Pulse 74   Temp 98.2 F (36.8 C)   Ht 5\' 5"  (1.651 m)   Wt 288 lb (130.6 kg)   SpO2 97%   BMI 47.93 kg/m   Physical Exam Vitals reviewed.  Constitutional:       Appearance: Normal appearance. He is obese.  Abdominal:     General: There is no distension.     Palpations: There is no mass.     Tenderness: There is no abdominal tenderness. There is no rebound.     Hernia: No hernia is present.  Genitourinary:    Comments: Perineal hidradenitis w/o abscess Musculoskeletal:        General: No swelling. Normal range of motion.  Skin:    General: Skin is warm and dry.     Capillary Refill: Capillary refill takes less than 2 seconds.  Neurological:     General: No focal deficit present.     Mental Status: He is alert and oriented to person, place, and time.  Psychiatric:        Mood and Affect: Mood normal.        Behavior: Behavior normal.        Thought Content: Thought content normal.        Judgment: Judgment normal.     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  No need for surgical intervention at this time.  We will do short course thyropathy of doxycycline.  I have also requested a dermatology consult.  Discussed with him in detail about risk factor modification to include improving his BMI, smoking cessation and diabetes control. Please note the spent 30 minutes in this encounter including person reviewing medical records, coordinating his care, placing orders and performing documentation  Sterling Big, MD [facility] General Surgeon

## 2023-05-13 NOTE — Progress Notes (Signed)
Patient ID: Oscar [name], male   DOB: May 16, 1976, 47 y.o.   MRN: [id number] [facility] - HEART FAILURE CLINIC - PHARMACIST COUNSELING NOTE  *HFrEF*  Guideline-Directed Medical Therapy/Evidence Based Medicine  ACE/ARB/ARNI: Sherryll Burger 49/51mg  taking 1/2 tablet twice daily Beta Blocker: none due to hypotension Aldosterone Antagonist: none due to hypotension Diuretic: Torsemide 40 mg BID SGLT2i:  canagliflozin 300mg   changed to Jardiance 10mg  daily  Adherence Assessment  Do you ever forget to take your medication? [] Yes [x] No  Do you ever skip doses due to side effects? [] Yes [x] No  Do you have trouble affording your medicines? [] Yes [x] No  Are you ever unable to pick up your medication due to transportation difficulties? [] Yes [x] No  Do you ever stop taking your medications because you don't believe they are helping? [] Yes [x] No  Do you check your weight daily? [x] Yes [] No   Adherence strategy: pill dispenser  Barriers to obtaining medications: none  Vital signs: HR 81, BP 114/80, weight (pounds) 285 lb ECHO: Date 11/23/20, EF < 20%, notes moderate LVH and moderately elevated PA pressure     Latest Ref Rng & Units 04/27/2023   12:05 PM 03/23/2023   11:36 AM 11/10/2022   11:49 AM  BMP  Glucose 70 - 99 mg/dL 841  324  401   BUN 6 - 20 mg/dL 39  37  40   Creatinine 0.61 - 1.24 mg/dL 0.27  2.53  6.64   Sodium 135 - 145 mmol/L 137  135  138   Potassium 3.5 - 5.1 mmol/L 3.7  4.7  3.6   Chloride 98 - 111 mmol/L 98  98  101   CO2 22 - 32 mmol/L 29  29  29    Calcium 8.9 - 10.3 mg/dL 8.7  9.0  9.3     Past Medical History:  Diagnosis Date   AICD (automatic cardioverter/defibrillator) present    Aortic atherosclerosis (HCC)    Asthma    Chronic respiratory failure (HCC)    CKD (chronic kidney disease), stage III (HCC)    COPD (chronic obstructive pulmonary disease) (HCC)    Deafness in right ear    Diabetes mellitus without complication (HCC)     Dyspnea    GERD (gastroesophageal reflux disease)    Gout    HFrEF (heart failure with reduced ejection fraction) (HCC)    a.) TTE 12/10/14: EF 25%; diff inf HK; sev LV and mod LA dil; LVH. b.) TTE 06/23/18: EF 20-25%; LVH; mild LV dil, mild BAE. c.) TTE 03/01/19: EF 15%, LVH, BAE; triv PR/TR. d.) TTE 02/27/20: EF < 20%; sev LV dil; mild MR. e.) TTE 11/23/20: EF < 20%; glob HK; sev LV dil; LVH; mild-mod BAE; mod-sev TR, triv AR; G1DD. f.) TTE 05/14/21: EF <15%; LVH; sev LA and mild RV enlar; triv AR/PR, mild TR, mod MR.   Hiatal hernia    History of cardiac catheterization    a.) R/LHC 12/14/2009: normal coronaries. b.) R/LHC 12/11/2014: normal coronaries.   Hyperlipidemia    Hypertension    Hypoxemia    LBBB (left bundle branch block)    NICM (nonischemic cardiomyopathy; dilated cardiomyopathy) (HCC)    a.) R/LHC 12/14/2009: normal cors; LVEDP 18 mmHg, mean PA 29 mmHg, mean PCWP 31 mmHg; CO 6 L/min; CI 2.54 L/min/m. b.) TTE 12/10/2014: EF 25%. c.) R/LHC 12/11/2014: mean RA 9 mmHg, mean PA 22 mmHg, mean PCWP 22 mmHg. d.) TTE 06/23/2018: EF 20-25%. e.) TTE 03/01/2019: EF 15%. f.) TTE 02/27/2020: < 20%.  g.) TTE 11/23/2020: EF <20%. h.) TTE 05/14/2021: < 15%.   NSTEMI (non-ST elevated myocardial infarction) (HCC)    a.) x 3 per patient report ---> 2012, 2014, 2016   Obesity    On supplemental oxygen by nasal cannula    a.) 2-3 L/[place] PRN   OSA on CPAP    PAF (paroxysmal atrial fibrillation) (HCC)    a.) CHA2DS2-VASc = 4 (HFrEF, HTN, prior MI, T2DM). b.) rate/rhythm maintained without pharmacologial intervention; no current anticoagulation.   Pancreatitis    PSVT (paroxysmal supraventricular tachycardia)     ASSESSMENT 47 year old male who presents to the HF clinic for follow up. PMH significant for atrial fibrillation, h/o paroxysmal SVT, nonischemic cardiomyopathy with LVEF <20%, s/p dual AICD placement (2016), acute on chronic systolic congestive heart failure, previous NSTEMI's (2014 & 2016)  (normal coronary anatomy per [facility] in 2016), LBBB, hypertension, hyperlipidemia, OSA, COPD, DM type II, tobacco use, obesity and GERD.   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 I & D and debridement completed. Placed on antibiotics. BP improved since last OV and his Invokana was change to [place] today by PCP.  PLAN  -Continue medication as previously prescribed - Will add spironolactone 12.5mg  daily during next OV if BM and BP remains stable.  Time spent: 15 minutes  Arla Boutwell Rodriguez-Guzman PharmD, BCPS 05/13/2023 11:03 AM   Current Outpatient Medications:    allopurinol (ZYLOPRIM) 100 MG tablet, Take by mouth., Disp: , Rfl:    atorvastatin (LIPITOR) 40 MG tablet, Take 40 mg by mouth daily., Disp: , Rfl:    clindamycin (CLEOCIN-T) 1 % lotion, Apply to affected areas daily after shower., Disp: 60 mL, Rfl: 3   colchicine 0.6 MG tablet, Take 0.6 mg by mouth as needed (gout)., Disp: , Rfl:    doxycycline (VIBRAMYCIN) 100 MG capsule, Take 2 capsules (200 mg total) by mouth 2 (two) times daily for 14 days., Disp: 56 capsule, Rfl: 0   ENTRESTO 24-26 MG, Take 0.5 tablets by mouth 2 (two) times daily., Disp: , Rfl:    JARDIANCE 10 MG TABS tablet, Take 10 mg by mouth daily., Disp: , Rfl:    ketoconazole (NIZORAL) 2 % cream, Apply twice daily to neck until clear, Disp: 60 g, Rfl: 2   meloxicam (MOBIC) 15 MG tablet, Take 1 tablet by mouth once daily, Disp: 30 tablet, Rfl: 0   metoprolol succinate (TOPROL XL) 25 MG 24 hr tablet, Take 1 tablet (25 mg total) by mouth daily., Disp: 30 tablet, Rfl: 5   mupirocin ointment (BACTROBAN) 2 %, Apply topically 3 (three) times daily., Disp: , Rfl:    NON FORMULARY, Pt uses a cpap nightly, Disp: , Rfl:    omeprazole (PRILOSEC) 20 MG capsule, Take 20 mg by mouth daily., Disp: , Rfl:    tazarotene (AVAGE) 0.1 % cream, Apply topically at bedtime., Disp: 60 g, Rfl: 3   torsemide (DEMADEX) 20 MG tablet, Take 40 mg by mouth daily., Disp: , Rfl:     umeclidinium-vilanterol (ANORO ELLIPTA) 62.5-25 MCG/ACT AEPB, Inhale 1 puff into the lungs daily., Disp: 60 each, Rfl: 6   VENTOLIN HFA 108 (90 Base) MCG/ACT inhaler, Inhale 2 puffs into the lungs every 6 (six) hours as needed for shortness of breath., Disp: , Rfl:    Vitamin D, Ergocalciferol, (DRISDOL) 1.25 MG (50000 UNIT) CAPS capsule, Take 50,000 Units by mouth once a week., Disp: , Rfl:    MEDICATION ADHERENCES TIPS AND STRATEGIES Taking medication as prescribed improves patient outcomes in heart  failure (reduces hospitalizations, improves symptoms, increases survival) Side effects of medications can be managed by decreasing doses, switching agents, stopping drugs, or adding additional therapy. Please let someone in the Heart Failure Clinic know if you have having bothersome side effects so we can modify your regimen. Do not alter your medication regimen without talking to [place].  Medication reminders can help patients remember to take drugs on time. If you are missing or forgetting doses you can try linking behaviors, using pill boxes, or an electronic reminder like an alarm on your phone or an app. Some people can also get automated phone calls as medication reminders.

## 2023-05-19 ENCOUNTER — Ambulatory Visit (INDEPENDENT_AMBULATORY_CARE_PROVIDER_SITE_OTHER): Payer: 59 | Admitting: Podiatry

## 2023-05-19 DIAGNOSIS — Z794 Long term (current) use of insulin: Secondary | ICD-10-CM

## 2023-05-19 DIAGNOSIS — M79674 Pain in right toe(s): Secondary | ICD-10-CM | POA: Diagnosis not present

## 2023-05-19 DIAGNOSIS — E119 Type 2 diabetes mellitus without complications: Secondary | ICD-10-CM | POA: Diagnosis not present

## 2023-05-19 DIAGNOSIS — M79675 Pain in left toe(s): Secondary | ICD-10-CM

## 2023-05-19 DIAGNOSIS — B351 Tinea unguium: Secondary | ICD-10-CM

## 2023-05-19 NOTE — Progress Notes (Signed)
Subjective:  Patient ID: Oscar [name], male    DOB: 06/22/1976,  MRN: [id number]  Chief Complaint  Patient presents with   Nail Problem    Nail trim    47 y.o. male presents with the above complaint. History confirmed with patient.  His nails are thickened and elongated and causing discomfort again, says his diabetes is well controlled  Denies tingling numbness or neuropathy symptoms  Objective:  Physical Exam: warm, good capillary refill, no trophic changes or ulcerative lesions, normal DP and PT pulses, and normal sensory exam. Left Foot: dystrophic yellowed discolored nail plates with subungual debris Right Foot: dystrophic yellowed discolored nail plates with subungual debris  Assessment:   No diagnosis found.      Plan:  Patient was evaluated and treated and all questions answered.  Discussed the etiology and treatment options for the condition in detail with the patient. Educated patient on the topical and oral treatment options for mycotic nails. Recommended debridement of the nails today. Sharp and mechanical debridement performed of all painful and mycotic nails today. Nails debrided in length and thickness using a nail nipper to level of comfort. Discussed treatment options including appropriate shoe gear. Follow up as needed for painful nails.    No follow-ups on file.

## 2023-06-12 ENCOUNTER — Emergency Department: Payer: 59

## 2023-06-12 ENCOUNTER — Observation Stay
Admission: EM | Admit: 2023-06-12 | Discharge: 2023-06-13 | Disposition: A | Discharge status: Home / Self Care | Payer: 59 | Attending: Internal Medicine | Admitting: Internal Medicine

## 2023-06-12 DIAGNOSIS — I472 Ventricular tachycardia, unspecified: Principal | ICD-10-CM | POA: Diagnosis present

## 2023-06-12 DIAGNOSIS — N1831 Chronic kidney disease, stage 3a: Secondary | ICD-10-CM | POA: Diagnosis not present

## 2023-06-12 DIAGNOSIS — F17210 Nicotine dependence, cigarettes, uncomplicated: Secondary | ICD-10-CM | POA: Insufficient documentation

## 2023-06-12 DIAGNOSIS — I2489 Other forms of acute ischemic heart disease: Secondary | ICD-10-CM

## 2023-06-12 DIAGNOSIS — R0789 Other chest pain: Secondary | ICD-10-CM | POA: Diagnosis present

## 2023-06-12 DIAGNOSIS — E1122 Type 2 diabetes mellitus with diabetic chronic kidney disease: Secondary | ICD-10-CM | POA: Insufficient documentation

## 2023-06-12 DIAGNOSIS — J45909 Unspecified asthma, uncomplicated: Secondary | ICD-10-CM | POA: Insufficient documentation

## 2023-06-12 DIAGNOSIS — Z95810 Presence of automatic (implantable) cardiac defibrillator: Secondary | ICD-10-CM | POA: Insufficient documentation

## 2023-06-12 DIAGNOSIS — E119 Type 2 diabetes mellitus without complications: Secondary | ICD-10-CM

## 2023-06-12 DIAGNOSIS — I471 Supraventricular tachycardia, unspecified: Secondary | ICD-10-CM | POA: Diagnosis not present

## 2023-06-12 DIAGNOSIS — J449 Chronic obstructive pulmonary disease, unspecified: Secondary | ICD-10-CM | POA: Diagnosis not present

## 2023-06-12 DIAGNOSIS — G4733 Obstructive sleep apnea (adult) (pediatric): Secondary | ICD-10-CM | POA: Diagnosis present

## 2023-06-12 DIAGNOSIS — I5022 Chronic systolic (congestive) heart failure: Secondary | ICD-10-CM | POA: Diagnosis not present

## 2023-06-12 DIAGNOSIS — J9612 Chronic respiratory failure with hypercapnia: Secondary | ICD-10-CM | POA: Diagnosis present

## 2023-06-12 DIAGNOSIS — I480 Paroxysmal atrial fibrillation: Secondary | ICD-10-CM | POA: Insufficient documentation

## 2023-06-12 DIAGNOSIS — J9602 Acute respiratory failure with hypercapnia: Secondary | ICD-10-CM | POA: Insufficient documentation

## 2023-06-12 DIAGNOSIS — J441 Chronic obstructive pulmonary disease with (acute) exacerbation: Secondary | ICD-10-CM | POA: Diagnosis present

## 2023-06-12 DIAGNOSIS — I130 Hypertensive heart and chronic kidney disease with heart failure and stage 1 through stage 4 chronic kidney disease, or unspecified chronic kidney disease: Secondary | ICD-10-CM | POA: Diagnosis not present

## 2023-06-12 DIAGNOSIS — Z794 Long term (current) use of insulin: Secondary | ICD-10-CM

## 2023-06-12 LAB — COMPREHENSIVE METABOLIC PANEL
ALT: 32 U/L (ref 0–44)
AST: 38 U/L (ref 15–41)
Albumin: 3.5 g/dL (ref 3.5–5.0)
Alkaline Phosphatase: 182 U/L — ABNORMAL HIGH (ref 38–126)
Anion gap: 12 (ref 5–15)
BUN: 35 mg/dL — ABNORMAL HIGH (ref 6–20)
CO2: 24 mmol/L (ref 22–32)
Calcium: 8.5 mg/dL — ABNORMAL LOW (ref 8.9–10.3)
Chloride: 98 mmol/L (ref 98–111)
Creatinine, Ser: 1.47 mg/dL — ABNORMAL HIGH (ref 0.61–1.24)
GFR, Estimated: 59 mL/min — ABNORMAL LOW (ref >60)
Glucose, Bld: 161 mg/dL — ABNORMAL HIGH (ref 70–99)
Potassium: 3.7 mmol/L (ref 3.5–5.1)
Sodium: 134 mmol/L — ABNORMAL LOW (ref 135–145)
Total Bilirubin: 0.6 mg/dL (ref 0.3–1.2)
Total Protein: 7.9 g/dL (ref 6.5–8.1)

## 2023-06-12 LAB — PROTIME-INR
INR: 1 (ref 0.8–1.2)
Prothrombin Time: 13.7 s (ref 11.4–15.2)

## 2023-06-12 LAB — CBC
HCT: 40.9 % (ref 39.0–52.0)
Hemoglobin: 14.1 g/dL (ref 13.0–17.0)
MCH: 32.3 pg (ref 26.0–34.0)
MCHC: 34.5 g/dL (ref 30.0–36.0)
MCV: 93.8 fL (ref 80.0–100.0)
Platelets: 144 10*3/uL — ABNORMAL LOW (ref 150–400)
RBC: 4.36 MIL/uL (ref 4.22–5.81)
RDW: 12.1 % (ref 11.5–15.5)
WBC: 3.8 10*3/uL — ABNORMAL LOW (ref 4.0–10.5)
nRBC: 0 % (ref 0.0–0.2)

## 2023-06-12 LAB — CBG MONITORING, ED: Glucose-Capillary: 114 mg/dL — ABNORMAL HIGH (ref 70–99)

## 2023-06-12 LAB — APTT: aPTT: 30 s (ref 24–36)

## 2023-06-12 LAB — MAGNESIUM: Magnesium: 1.5 mg/dL — ABNORMAL LOW (ref 1.7–2.4)

## 2023-06-12 LAB — BRAIN NATRIURETIC PEPTIDE: B Natriuretic Peptide: 464.6 pg/mL — ABNORMAL HIGH (ref 0.0–100.0)

## 2023-06-12 LAB — TROPONIN I (HIGH SENSITIVITY)
Troponin I (High Sensitivity): 88 ng/L — ABNORMAL HIGH (ref <18)
Troponin I (High Sensitivity): 377 ng/L (ref <18)

## 2023-06-12 MED ORDER — ATORVASTATIN CALCIUM 20 MG PO TABS
40.0000 mg | ORAL_TABLET | Freq: Every day | ORAL | Status: DC
  Administered 2023-06-12 – 2023-06-13 (×2): 40 mg via ORAL
  Filled 2023-06-12 (×2): qty 2

## 2023-06-12 MED ORDER — INSULIN ASPART 100 UNIT/ML IJ SOLN
0.0000 [IU] | Freq: Three times a day (TID) | INTRAMUSCULAR | Status: DC
  Administered 2023-06-13: 3 [IU] via SUBCUTANEOUS
  Administered 2023-06-13: 2 [IU] via SUBCUTANEOUS
  Filled 2023-06-12 (×2): qty 1

## 2023-06-12 MED ORDER — MAGNESIUM SULFATE 2 GM/50ML IV SOLN
2.0000 g | Freq: Once | INTRAVENOUS | Status: AC
  Administered 2023-06-12: 2 g via INTRAVENOUS
  Filled 2023-06-12: qty 50

## 2023-06-12 MED ORDER — ONDANSETRON HCL 4 MG/2ML IJ SOLN: 4.0000 mg | Freq: Four times a day (QID) | INTRAMUSCULAR | Status: DC | PRN

## 2023-06-12 MED ORDER — TORSEMIDE 20 MG PO TABS
40.0000 mg | ORAL_TABLET | Freq: Every day | ORAL | Status: DC
  Administered 2023-06-13: 40 mg via ORAL
  Filled 2023-06-12: qty 2

## 2023-06-12 MED ORDER — CARVEDILOL 3.125 MG PO TABS
3.1250 mg | ORAL_TABLET | Freq: Two times a day (BID) | ORAL | Status: DC
  Administered 2023-06-12 – 2023-06-13 (×2): 3.125 mg via ORAL
  Filled 2023-06-12 (×2): qty 1

## 2023-06-12 MED ORDER — ACETAMINOPHEN 650 MG RE SUPP: 650.0000 mg | Freq: Four times a day (QID) | RECTAL | Status: DC | PRN

## 2023-06-12 MED ORDER — UMECLIDINIUM-VILANTEROL 62.5-25 MCG/ACT IN AEPB
1.0000 | INHALATION_SPRAY | Freq: Every day | RESPIRATORY_TRACT | Status: DC
  Filled 2023-06-12: qty 14

## 2023-06-12 MED ORDER — AMIODARONE HCL IN DEXTROSE 360-4.14 MG/200ML-% IV SOLN
30.0000 mg/h | INTRAVENOUS | Status: DC
  Administered 2023-06-12: 30 mg/h via INTRAVENOUS
  Filled 2023-06-12: qty 200

## 2023-06-12 MED ORDER — PANTOPRAZOLE SODIUM 40 MG PO TBEC
40.0000 mg | DELAYED_RELEASE_TABLET | Freq: Every day | ORAL | Status: DC
  Administered 2023-06-12 – 2023-06-13 (×2): 40 mg via ORAL
  Filled 2023-06-12 (×2): qty 1

## 2023-06-12 MED ORDER — ACETAMINOPHEN 325 MG PO TABS: 650.0000 mg | ORAL_TABLET | Freq: Four times a day (QID) | ORAL | Status: DC | PRN

## 2023-06-12 MED ORDER — SODIUM CHLORIDE 0.9% FLUSH
3.0000 mL | Freq: Two times a day (BID) | INTRAVENOUS | Status: DC
  Administered 2023-06-12 – 2023-06-13 (×3): 3 mL via INTRAVENOUS

## 2023-06-12 MED ORDER — AMIODARONE LOAD VIA INFUSION
150.0000 mg | Freq: Once | INTRAVENOUS | Status: AC
  Administered 2023-06-12: 150 mg via INTRAVENOUS
  Filled 2023-06-12: qty 83.34

## 2023-06-12 MED ORDER — AMIODARONE HCL IN DEXTROSE 360-4.14 MG/200ML-% IV SOLN
60.0000 mg/h | INTRAVENOUS | Status: AC
  Administered 2023-06-12: 60 mg/h via INTRAVENOUS
  Filled 2023-06-12 (×2): qty 200

## 2023-06-12 MED ORDER — ONDANSETRON HCL 4 MG PO TABS: 4.0000 mg | ORAL_TABLET | Freq: Four times a day (QID) | ORAL | Status: DC | PRN

## 2023-06-12 MED ORDER — ENOXAPARIN SODIUM 80 MG/0.8ML IJ SOSY
0.5000 mg/kg | PREFILLED_SYRINGE | INTRAMUSCULAR | Status: DC
  Administered 2023-06-12: 67.5 mg via SUBCUTANEOUS
  Filled 2023-06-12 (×2): qty 0.68

## 2023-06-12 NOTE — Assessment & Plan Note (Signed)
- 

## 2023-06-12 NOTE — Assessment & Plan Note (Signed)
Relatively well-controlled with last A1c of 7.3% in June 2024.  - Hold home Jardiance - SSI, moderate - A1c pending

## 2023-06-12 NOTE — Progress Notes (Signed)
PHARMACIST - PHYSICIAN COMMUNICATION  CONCERNING:  Enoxaparin (Lovenox) for DVT Prophylaxis    RECOMMENDATION: Patient was prescribed enoxaprin 40mg  q24 hours for VTE prophylaxis.   Filed Weights   06/12/23 1304  Weight: 133.8 kg (295 lb)    Body mass index is 47.61 kg/m.  Estimated Creatinine Clearance: 80.7 mL/min (A) (by C-G formula based on SCr of 1.47 mg/dL (H)).   Based on [facility] policy patient is candidate for enoxaparin 0.5mg /kg TBW SQ every 24 hours based on BMI being >30.  DESCRIPTION: Pharmacy has adjusted enoxaparin dose per [facility] policy.  Patient is now receiving enoxaparin 67.5 mg every 24 hours    Rockwell Alexandria, PharmD Clinical Pharmacist  06/12/2023 2:56 PM

## 2023-06-12 NOTE — Assessment & Plan Note (Addendum)
Patient is presenting with sudden onset shortness of breath with chest pain that resolved secondary to AICD defibrillation in the setting of ventricular tachycardia.  Potentially triggered by hypomagnesemia versus self discontinuation of amiodarone in 2023.  Patient is also not currently on a beta-blocker.  Low suspicion at this time for ACS.  - Telemetry monitoring - Cardiology consulted; appreciate their recommendations - Per discussion between EDP and cardiology, amiodarone bolus and infusion was started; continue for now pending cardiology recommendations - S/p magnesium supplementation - Repeat BMP and magnesium in the a.m.

## 2023-06-12 NOTE — Consult Note (Signed)
[facility] Cardiology  CARDIOLOGY CONSULT NOTE  Patient ID: Oscar [name] MRN: [id number] DOB/AGE: 10-05-1975 47 y.o.  Admit date: 06/12/2023 Referring Physician Huel Cote Primary Physician [place] Primary Cardiologist [facility] Reason for Consultation ventricular tachycardia  HPI: 47 year old gentleman referred for evaluation of ventricular tachycardia.  Patient has known history of nonischemic dilated cardiomyopathy, chronic HFrEF, status post dual-chamber ICD, paroxysmal atrial fibrillation, and chronic kidney disease stage IIIa.  The patient's cardiac history is 10/10/2014 when he presented to [facility] with chest pain at which time left heart catheterization revealed insignificant coronary artery disease with dilated cardiomyopathy, with LVEF 20-25%.  The patient underwent dual-chamber ICD 03/30/2015 by Dr. Gerre Pebbles at [facility].  Patient is followed by Dr. Dan Europe, Dr. Edwena Blow and CHF clinic for chronic HFrEF.  Recent 2D echocardiogram 10/20/2022 revealed LVEF less than 20%.  Patient also has a history of paroxysmal atrial fibrillation, but the patient has been reluctant to start chronic anticoagulation.  He was in his usual state of health until this morning, after waking, experienced dizziness, and 2 shocks from his defibrillator.  The patient called EMS, and the patient was brought to [facility] ED.  Initial ECG revealed sinus tachycardia with left bundle branch block.  ICD interrogation revealed sustained VT, with appropriate ATP x 2, and shock x 3.  Admission labs notable for BUN and creatinine 35 and 1.47, respectively, magnesium 1.5, BNP 464.6, and high sensitivity troponin 0.07.  Patient reports he had been feeling well, without chest pain or shortness of breath.  Review of systems complete and found to be negative unless listed above     Past Medical History:  Diagnosis Date   AICD (automatic cardioverter/defibrillator) present    Aortic atherosclerosis (HCC)    Asthma    Chronic  respiratory failure (HCC)    CKD (chronic kidney disease), stage III (HCC)    COPD (chronic obstructive pulmonary disease) (HCC)    Deafness in right ear    Diabetes mellitus without complication (HCC)    Dyspnea    GERD (gastroesophageal reflux disease)    Gout    HFrEF (heart failure with reduced ejection fraction) (HCC)    a.) TTE 12/10/14: EF 25%; diff inf HK; sev LV and mod LA dil; LVH. b.) TTE 06/23/18: EF 20-25%; LVH; mild LV dil, mild BAE. c.) TTE 03/01/19: EF 15%, LVH, BAE; triv PR/TR. d.) TTE 02/27/20: EF < 20%; sev LV dil; mild MR. e.) TTE 11/23/20: EF < 20%; glob HK; sev LV dil; LVH; mild-mod BAE; mod-sev TR, triv AR; G1DD. f.) TTE 05/14/21: EF <15%; LVH; sev LA and mild RV enlar; triv AR/PR, mild TR, mod MR.   Hiatal hernia    History of cardiac catheterization    a.) R/LHC 12/14/2009: normal coronaries. b.) R/LHC 12/11/2014: normal coronaries.   Hyperlipidemia    Hypertension    Hypoxemia    LBBB (left bundle branch block)    NICM (nonischemic cardiomyopathy; dilated cardiomyopathy) (HCC)    a.) R/LHC 12/14/2009: normal cors; LVEDP 18 mmHg, mean PA 29 mmHg, mean PCWP 31 mmHg; CO 6 L/min; CI 2.54 L/min/m. b.) TTE 12/10/2014: EF 25%. c.) R/LHC 12/11/2014: mean RA 9 mmHg, mean PA 22 mmHg, mean PCWP 22 mmHg. d.) TTE 06/23/2018: EF 20-25%. e.) TTE 03/01/2019: EF 15%. f.) TTE 02/27/2020: < 20%. g.) TTE 11/23/2020: EF <20%. h.) TTE 05/14/2021: < 15%.   NSTEMI (non-ST elevated myocardial infarction) (HCC)    a.) x 3 per patient report ---> 2012, 2014, 2016   Obesity  On supplemental oxygen by nasal cannula    a.) 2-3 L/[place] PRN   OSA on CPAP    PAF (paroxysmal atrial fibrillation) (HCC)    a.) CHA2DS2-VASc = 4 (HFrEF, HTN, prior MI, T2DM). b.) rate/rhythm maintained without pharmacologial intervention; no current anticoagulation.   Pancreatitis    PSVT (paroxysmal supraventricular tachycardia)     Past Surgical History:  Procedure Laterality Date   BIOPSY  11/25/2021   Procedure:  BIOPSY;  Surgeon: Meridee Score Netty Starring., MD;  Location: [facility];  Service: Gastroenterology;;   CARDIAC CATHETERIZATION  12/11/2014   Procedure: RIGHT/LEFT HEART CATH AND CORONARY ANGIOGRAPHY;  Surgeon: Runell Gess, MD;  Location: [facility] CATH LAB;  Service: Cardiovascular;;   COLONOSCOPY WITH PROPOFOL N/A 11/13/2021   Procedure: COLONOSCOPY WITH PROPOFOL;  Surgeon: Toney Reil, MD;  Location: [facility] ENDOSCOPY;  Service: Gastroenterology;  Laterality: N/A;   ESOPHAGOGASTRODUODENOSCOPY (EGD) WITH PROPOFOL N/A 11/25/2021   Procedure: ESOPHAGOGASTRODUODENOSCOPY (EGD) WITH PROPOFOL;  Surgeon: Meridee Score Netty Starring., MD;  Location: [facility] ENDOSCOPY;  Service: Gastroenterology;  Laterality: N/A;   ICD LEAD REMOVAL N/A 03/30/2015   Procedure: ICD LEAD REMOVAL;  Surgeon: Sharion Settler, MD;  Location: [facility] ORS;  Service: Cardiovascular;  Laterality: N/A;   IMPLANTABLE CARDIOVERTER DEFIBRILLATOR IMPLANT     INCISION AND DRAINAGE ABSCESS N/A 12/12/2020   Procedure: INCISION AND DRAINAGE ABSCESS;  Surgeon: Leafy Ro, MD;  Location: [facility] ORS;  Service: General;  Laterality: N/A;   INSERT / REPLACE / REMOVE PACEMAKER     LEFT HEART CATHETERIZATION WITH CORONARY ANGIOGRAM N/A 12/09/2014   Procedure: LEFT HEART CATHETERIZATION WITH CORONARY ANGIOGRAM;  Surgeon: Kathleene Hazel, MD;  Location: [facility] CATH LAB;  Service: Cardiovascular;  Laterality: N/A;   RIGHT/LEFT HEART CATH AND CORONARY ANGIOGRAPHY Bilateral 12/14/2009   Procedure: RIGHT/LEFT HEART CATH AND CORONARY ANGIOGRAPHY; Location: [facility]; Surgeon: Rudean Hitt, MD   TONSILLECTOMY     as a child , adnoids removed   UPPER ESOPHAGEAL ENDOSCOPIC ULTRASOUND (EUS) N/A 11/25/2021   Procedure: UPPER ESOPHAGEAL ENDOSCOPIC ULTRASOUND (EUS);  Surgeon: Lemar Lofty., MD;  Location: [facility];  Service: Gastroenterology;  Laterality: N/A;    (Not in a hospital admission)  Social History   Socioeconomic History   Marital  status: Single    Spouse name: Not on file   Number of children: Not on file   Years of education: Not on file   Highest education level: Not on file  Occupational History   Occupation: unemployed  Tobacco Use   Smoking status: Every Day    Current packs/day: 1.50    Average packs/day: 1.5 packs/day for 34.0 years (51.0 ttl pk-yrs)    Types: Cigarettes    Passive exposure: Past   Smokeless tobacco: Never   Tobacco comments:    1/2 ppd Vernie Murders, [facility] 04/23/22   Vaping Use   Vaping status: Never Used  Substance and Sexual Activity   Alcohol use: No   Drug use: No   Sexual activity: Yes  Other Topics Concern   Not on file  Social History Narrative   Live alone   Social Determinants of Health   Financial Resource Strain: Not on file  Food Insecurity: Not on file  Transportation Needs: Not on file  Physical Activity: Not on file  Stress: Not on file  Social Connections: Not on file  Intimate Partner Violence: Not on file    Family History  Problem Relation Age of Onset   Hypertension Mother    Congestive Heart Failure  Mother    Hypertension Sister    Diabetes Sister    Pancreatitis Sister    COPD Sister    Emphysema Sister        smoked   Pancreatitis Brother    Anemia Neg Hx    Arrhythmia Neg Hx    Asthma Neg Hx    Clotting disorder Neg Hx    Fainting Neg Hx    Heart attack Neg Hx    Heart disease Neg Hx    Heart failure Neg Hx    Hyperlipidemia Neg Hx       Review of systems complete and found to be negative unless listed above      PHYSICAL EXAM  General: Well developed, well nourished, in no acute distress HEENT:  Normocephalic and atramatic Neck:  No JVD.  Lungs: Clear bilaterally to auscultation and percussion. Heart: HRRR . Normal S1 and S2 without gallops or murmurs.  Abdomen: Bowel sounds are positive, abdomen soft and non-tender  Msk:  Back normal, normal gait. Normal strength and tone for age. Extremities: No clubbing, cyanosis or  edema.   Neuro: Alert and oriented X 3. Psych:  Good affect, responds appropriately  Labs:   Lab Results  Component Value Date   WBC 3.8 (L) 06/12/2023   HGB 14.1 06/12/2023   HCT 40.9 06/12/2023   MCV 93.8 06/12/2023   PLT 144 (L) 06/12/2023    Recent Labs  Lab 06/12/23 1306  NA 134*  K 3.7  CL 98  CO2 24  BUN 35*  CREATININE 1.47*  CALCIUM 8.5*  PROT 7.9  BILITOT 0.6  ALKPHOS 182*  ALT 32  AST 38  GLUCOSE 161*   Lab Results  Component Value Date   CKTOTAL 257 (H) 12/19/2012   CKMB 2.0 08/11/2014   TROPONINI 0.07 (HH) 02/04/2018    Lab Results  Component Value Date   CHOL 223 (H) 09/30/2022   CHOL 247 (H) 09/12/2016   CHOL 234 (H) 07/07/2016   Lab Results  Component Value Date   HDL 35 (L) 09/30/2022   HDL 31 (L) 09/12/2016   HDL 31 (L) 07/07/2016   Lab Results  Component Value Date   LDLCALC 168 (H) 09/30/2022   LDLCALC 177 (H) 09/12/2016   LDLCALC 179 (H) 07/07/2016   Lab Results  Component Value Date   TRIG 98 09/30/2022   TRIG 116 10/03/2021   TRIG 157 (H) 05/19/2017   Lab Results  Component Value Date   CHOLHDL 6.4 09/30/2022   CHOLHDL 8.0 09/12/2016   CHOLHDL 7.5 07/07/2016   No results found for: "LDLDIRECT"    Radiology: No results found.  EKG: Sinus tachycardia with left bundle branch block  ASSESSMENT AND PLAN:   1.  Ventricular tachycardia, with appropriate ATP and shock x 3 with successful conversion to sinus tachycardia with underlying chronic left bundle branch block 2.  Chronic HFrEF, LVEF less than 20% 10/20/2022, patient appears euvolemic without recent signs or symptoms of CHF decompensation, on good medical management with Entresto, torsemide, empagliflozin 3.  Nonischemic dilated cardiomyopathy, with normal coronary anatomy by cardiac catheterization 12/09/2014 4.  Paroxysmal atrial fibrillation, currently not anticoagulated per patient's wishes 5.  Chronic kidney disease stage IIIa, stable  Recommendations  1.   Agree with current therapy 2.  Continue amiodarone infusion 1 g load 3.  Transition to amiodarone 400 mg twice daily after 1 g IV load 4.  Magnesium repletion 5.  Add carvedilol 3.125 mg twice daily 6.  Consider upgrade  dual-chamber ICD to CRT-D per Dr. Gerre Pebbles at [facility] as outpatient   Signed: Marcina Millard MD,PhD, [facility] 06/12/2023, 3:27 PM

## 2023-06-12 NOTE — ED Provider Notes (Signed)
[facility] Provider Note    Event Date/Time   First MD Initiated Contact with Patient 06/12/23 1251     (approximate)   History   No chief complaint on file.   HPI  Oscar [name] is a 47 y.o. male with history of CAD with known reduced EF less than 20%, ICD who comes in with concerns for chest pain.  Patient reports some chest pain shortness of breath that started around 1145.  He reports waking up this morning and feeling his normal self when he had sudden onset of discomfort.  He reports feeling a shock x 2.  He did not lose consciousness did not fall down to the ground.  He states that he has not been on any blood thinners please been taking all of his other normal medications.  He denies feeling like he has had any significant weight gain.  He does report having obstructive sleep apnea.  I reviewed his prior blood pressures and he typically runs in the 90s over 60s.  He does have a history of atrial fibrillation but again he is not anticoagulated.   Physical Exam   Triage Vital Signs: ED Triage Vitals  Encounter Vitals Group     BP      Systolic BP Percentile      Diastolic BP Percentile      Pulse      Resp      Temp      Temp src      SpO2      Weight      Height      Head Circumference      Peak Flow      Pain Score      Pain Loc      Pain Education      Exclude from Growth Chart     Most recent vital signs: Vitals:   06/12/23 1303  SpO2: 95%     General: Awake, no distress.  CV:  Good peripheral perfusion.  Resp:  Normal effort.  Abd:  No distention.  Soft nontender Other:  Sensation intact throughout extremities.  Warm well-perfused extremities.   ED Results / Procedures / Treatments   Labs (all labs ordered are listed, but only abnormal results are displayed) Labs Reviewed  CBC  COMPREHENSIVE METABOLIC PANEL  PROTIME-INR  APTT  BRAIN NATRIURETIC PEPTIDE  MAGNESIUM  TROPONIN I (HIGH SENSITIVITY)     EKG  My  interpretation of EKG:  Sinus tachycardia rate of 103 with a left bundle branch block, inferior lateral T wave inversions  T wave inversions appear new from prior  RADIOLOGY I have reviewed the xray personally and interpreted and no pneumonia   PROCEDURES:  Critical Care performed: Yes, see critical care procedure note(s)  .1-3 Lead EKG Interpretation  Performed by: Concha Se, MD Authorized by: Concha Se, MD     Interpretation: normal     ECG rate:  80   ECG rate assessment: normal     Rhythm: sinus rhythm     Ectopy: none     Conduction: normal   .Critical Care  Performed by: Concha Se, MD Authorized by: Concha Se, MD   Critical care provider statement:    Critical care time (minutes):  30   Critical care was necessary to treat or prevent imminent or life-threatening deterioration of the following conditions:  Cardiac failure   Critical care was time spent personally by me on the  following activities:  Development of treatment plan with patient or surrogate, discussions with consultants, evaluation of patient's response to treatment, examination of patient, ordering and review of laboratory studies, ordering and review of radiographic studies, ordering and performing treatments and interventions, pulse oximetry, re-evaluation of patient's condition and review of old charts    MEDICATIONS ORDERED IN ED: Medications  amiodarone (NEXTERONE) 1.8 mg/mL load via infusion 150 mg (has no administration in time range)    Followed by  amiodarone (NEXTERONE PREMIX) 360-4.14 MG/200ML-% (1.8 mg/mL) IV infusion (has no administration in time range)    Followed by  amiodarone (NEXTERONE PREMIX) 360-4.14 MG/200ML-% (1.8 mg/mL) IV infusion (has no administration in time range)  magnesium sulfate IVPB 2 g 50 mL (has no administration in time range)     IMPRESSION / MDM / ASSESSMENT AND PLAN / ED COURSE  I reviewed the triage vital signs and the nursing  notes.   Patient's presentation is most consistent with acute presentation with potential threat to life or bodily function.   Patient comes in with concerns for 2 shocks being delivered from his ICD.  He is got new T wave inversions noted on EKG but does not meet STEMI criteria looks similar with left bundle branch block.  Will get troponins, chest x-ray.  Does not sound consistent with dissection or PE.  He denies feeling fluid overload.  Suspect ventricular tachycardia given he did not lose consciousness.  Will get device interpreted.  Mag  was low patient given some IV mag.  CBC shows stable a low platelets troponin is slightly elevated we will continue to trend out CMP shows stable creatinine.  Does appear that patient was previously on amiodarone but he has not been taking it and then it was eventually discontinued. Discussed with Dr Kenney Houseman load with IV amiodarone and admit for observation.  He may need some additional diuresis but patient feels like he is euvolemic.  Magnesium was low will give some supplementation.  The patient is on the cardiac monitor to evaluate for evidence of arrhythmia and/or significant heart rate changes.      FINAL CLINICAL IMPRESSION(S) / ED DIAGNOSES   Final diagnoses:  Ventricular tachycardia (HCC)  Hypomagnesemia     Rx / DC Orders   ED Discharge Orders     None        Note:  This document was prepared using Dragon voice recognition software and may include unintentional dictation errors.   Concha Se, MD 06/12/23 [phone]

## 2023-06-12 NOTE — ED Triage Notes (Signed)
Pt to ED via EMS following two instances of defibrillation from pacemaker following an instance of SOB and chest pain. Pt has a [employer] pacemaker and states he does not take prescribed blood thinners. Pt denies chest pain/SOB/dizziness on arrival. Pt is A&O x4.   3 L [place] baseline

## 2023-06-12 NOTE — Assessment & Plan Note (Signed)
No wheezing or shortness of breath reported at this time.  - Continue home bronchodilators

## 2023-06-12 NOTE — ED Notes (Signed)
Pt comes via EMs from home with c/o cp. Pt has defibrillator and it has shocked him twice per EMS. Pt EKG showed bunch branch. Pt in afib per EMS. Pt states sob and cp.   Pt taken to [address] as next bed available. RN Ladona Ridgel informed and given report by this RN and EMS again.

## 2023-06-12 NOTE — H&P (Signed)
History and Physical    Patient: Oscar [name] DOB: 1976-03-12 DOA: 06/12/2023 DOS: the patient was seen and examined on 06/12/2023 PCP: Preston Fleeting, MD  Patient coming from: Home  Chief Complaint:  Chief Complaint  Patient presents with   Pacemaker Problem   HPI: Oscar [name] is a 47 y.o. male with medical history significant of HFrEF EF less than 20% secondary to nonischemic dilated cardiomyopathy, type 2 diabetes, CKD stage IIIa, chronic hypoxic respiratory failure on as needed 3 L of supplemental oxygen, paroxysmal atrial fibrillation not on AC, OSA on CPAP, hypertension, hyperlipidemia, gout, COPD, who presents to the ED due to ICD firing.  Oscar [name] states he was in his usual state of health this morning sitting on the couch watching television when he noticed sudden onset shortness of breath, chest tightness and dizziness.  He then felt his ICD fired suddenly.  He was able to stand up and make it to the bathroom when he felt his ICD firing again.  Afterwards, his shortness of breath, chest pain and dizziness resolved.  He called EMS and on route to the ED, he was told that his ICD fired again.  At this time, he denies any chest pain, dizziness, palpitations, shortness of breath.  He states he does not feel hypervolemic and his weight is within 3 pounds of his baseline.  He notes the last time his ICD fired was several years ago.  He notes that he has been stressed lately and has been chain smoking.  Oscar [name] states that he takes Entresto once daily, torsemide 2 tablets once daily, Jardiance once daily, a medication for his gout once daily a medication for GERD once daily.  He is not currently taking amiodarone, metoprolol or carvedilol.  He states that he has historically been unwilling to take any blood thinner as he worries that he would then have to be on it lifelong.  ED course: On arrival to the ED, patient was normotensive at 110/76 with heart  rate of 97.  He was saturating at 99% on room air.  He was afebrile at 97.9.  Initial workup notable for WBC of 3.8, platelets of 144, sodium 134, BUN 35, creatinine 1.47 with GFR 59, troponin 88, BNP 464.  Chest x-ray obtained and pending.  EDP consulted with cardiology who recommended outpatient follow-up versus observation.  [facility] contacted for admission.  Review of Systems: As mentioned in the history of present illness. All other systems reviewed and are negative.  Past Medical History:  Diagnosis Date   AICD (automatic cardioverter/defibrillator) present    Aortic atherosclerosis (HCC)    Asthma    Chronic respiratory failure (HCC)    CKD (chronic kidney disease), stage III (HCC)    COPD (chronic obstructive pulmonary disease) (HCC)    Deafness in right ear    Diabetes mellitus without complication (HCC)    Dyspnea    GERD (gastroesophageal reflux disease)    Gout    HFrEF (heart failure with reduced ejection fraction) (HCC)    a.) TTE 12/10/14: EF 25%; diff inf HK; sev LV and mod LA dil; LVH. b.) TTE 06/23/18: EF 20-25%; LVH; mild LV dil, mild BAE. c.) TTE 03/01/19: EF 15%, LVH, BAE; triv PR/TR. d.) TTE 02/27/20: EF < 20%; sev LV dil; mild MR. e.) TTE 11/23/20: EF < 20%; glob HK; sev LV dil; LVH; mild-mod BAE; mod-sev TR, triv AR; G1DD. f.) TTE 05/14/21: EF <15%; LVH; sev LA and mild RV enlar; triv  AR/PR, mild TR, mod MR.   Hiatal hernia    History of cardiac catheterization    a.) R/LHC 12/14/2009: normal coronaries. b.) R/LHC 12/11/2014: normal coronaries.   Hyperlipidemia    Hypertension    Hypoxemia    LBBB (left bundle branch block)    NICM (nonischemic cardiomyopathy; dilated cardiomyopathy) (HCC)    a.) R/LHC 12/14/2009: normal cors; LVEDP 18 mmHg, mean PA 29 mmHg, mean PCWP 31 mmHg; CO 6 L/min; CI 2.54 L/min/m. b.) TTE 12/10/2014: EF 25%. c.) R/LHC 12/11/2014: mean RA 9 mmHg, mean PA 22 mmHg, mean PCWP 22 mmHg. d.) TTE 06/23/2018: EF 20-25%. e.) TTE 03/01/2019: EF 15%. f.) TTE  02/27/2020: < 20%. g.) TTE 11/23/2020: EF <20%. h.) TTE 05/14/2021: < 15%.   NSTEMI (non-ST elevated myocardial infarction) (HCC)    a.) x 3 per patient report ---> 2012, 2014, 2016   Obesity    On supplemental oxygen by nasal cannula    a.) 2-3 L/[place] PRN   OSA on CPAP    PAF (paroxysmal atrial fibrillation) (HCC)    a.) CHA2DS2-VASc = 4 (HFrEF, HTN, prior MI, T2DM). b.) rate/rhythm maintained without pharmacologial intervention; no current anticoagulation.   Pancreatitis    PSVT (paroxysmal supraventricular tachycardia)    Past Surgical History:  Procedure Laterality Date   BIOPSY  11/25/2021   Procedure: BIOPSY;  Surgeon: Meridee Score Netty Starring., MD;  Location: [facility];  Service: Gastroenterology;;   CARDIAC CATHETERIZATION  12/11/2014   Procedure: RIGHT/LEFT HEART CATH AND CORONARY ANGIOGRAPHY;  Surgeon: Runell Gess, MD;  Location: [facility] CATH LAB;  Service: Cardiovascular;;   COLONOSCOPY WITH PROPOFOL N/A 11/13/2021   Procedure: COLONOSCOPY WITH PROPOFOL;  Surgeon: Toney Reil, MD;  Location: [facility] ENDOSCOPY;  Service: Gastroenterology;  Laterality: N/A;   ESOPHAGOGASTRODUODENOSCOPY (EGD) WITH PROPOFOL N/A 11/25/2021   Procedure: ESOPHAGOGASTRODUODENOSCOPY (EGD) WITH PROPOFOL;  Surgeon: Meridee Score Netty Starring., MD;  Location: [facility] ENDOSCOPY;  Service: Gastroenterology;  Laterality: N/A;   ICD LEAD REMOVAL N/A 03/30/2015   Procedure: ICD LEAD REMOVAL;  Surgeon: Sharion Settler, MD;  Location: [facility] ORS;  Service: Cardiovascular;  Laterality: N/A;   IMPLANTABLE CARDIOVERTER DEFIBRILLATOR IMPLANT     INCISION AND DRAINAGE ABSCESS N/A 12/12/2020   Procedure: INCISION AND DRAINAGE ABSCESS;  Surgeon: Leafy Ro, MD;  Location: [facility] ORS;  Service: General;  Laterality: N/A;   INSERT / REPLACE / REMOVE PACEMAKER     LEFT HEART CATHETERIZATION WITH CORONARY ANGIOGRAM N/A 12/09/2014   Procedure: LEFT HEART CATHETERIZATION WITH CORONARY ANGIOGRAM;  Surgeon: Kathleene Hazel,  MD;  Location: [facility] CATH LAB;  Service: Cardiovascular;  Laterality: N/A;   RIGHT/LEFT HEART CATH AND CORONARY ANGIOGRAPHY Bilateral 12/14/2009   Procedure: RIGHT/LEFT HEART CATH AND CORONARY ANGIOGRAPHY; Location: [facility]; Surgeon: Rudean Hitt, MD   TONSILLECTOMY     as a child , adnoids removed   UPPER ESOPHAGEAL ENDOSCOPIC ULTRASOUND (EUS) N/A 11/25/2021   Procedure: UPPER ESOPHAGEAL ENDOSCOPIC ULTRASOUND (EUS);  Surgeon: Lemar Lofty., MD;  Location: [facility];  Service: Gastroenterology;  Laterality: N/A;   Social History:  reports that he has been smoking cigarettes. He has a 51 pack-year smoking history. He has been exposed to tobacco smoke. He has never used smokeless tobacco. He reports that he does not drink alcohol and does not use drugs.  Allergies  Allergen Reactions   Ciprofloxacin Swelling and Other (See Comments)    Migraine Headache    Iodinated Contrast Media     Other reaction(s): Vomiting Projectile vomiting   Isosorb  Dinitrate-Hydralazine Other (See Comments)    Migraine Headache    Family History  Problem Relation Age of Onset   Hypertension Mother    Congestive Heart Failure Mother    Hypertension Sister    Diabetes Sister    Pancreatitis Sister    COPD Sister    Emphysema Sister        smoked   Pancreatitis Brother    Anemia Neg Hx    Arrhythmia Neg Hx    Asthma Neg Hx    Clotting disorder Neg Hx    Fainting Neg Hx    Heart attack Neg Hx    Heart disease Neg Hx    Heart failure Neg Hx    Hyperlipidemia Neg Hx     Prior to Admission medications   Medication Sig Start Date End Date Taking? Authorizing Provider  allopurinol (ZYLOPRIM) 100 MG tablet Take by mouth.    [provider]  atorvastatin (LIPITOR) 40 MG tablet Take 40 mg by mouth daily.    [provider]  clindamycin (CLEOCIN-T) 1 % lotion Apply to affected areas daily after shower. 06/11/21   Willeen Niece, MD  colchicine 0.6 MG tablet Take 0.6 mg by mouth  as needed (gout). 11/04/21   [provider]  ENTRESTO 24-26 MG Take 0.5 tablets by mouth 2 (two) times daily. 09/16/22   [provider]  JARDIANCE 10 MG TABS tablet Take 10 mg by mouth daily. 06/25/22   [provider]  ketoconazole (NIZORAL) 2 % cream Apply twice daily to neck until clear 06/11/21   Willeen Niece, MD  meloxicam (MOBIC) 15 MG tablet Take 1 tablet by mouth once daily 05/01/23   McCaughan, Dia D, DPM  metoprolol succinate (TOPROL XL) 25 MG 24 hr tablet Take 1 tablet (25 mg total) by mouth daily. 10/20/22   Delma Freeze, FNP  mupirocin ointment (BACTROBAN) 2 % Apply topically 3 (three) times daily. 08/13/22   [provider]  NON FORMULARY Pt uses a cpap nightly    [provider]  omeprazole (PRILOSEC) 20 MG capsule Take 20 mg by mouth daily. 07/09/22   [provider]  tazarotene (AVAGE) 0.1 % cream Apply topically at bedtime. 06/11/21   Willeen Niece, MD  torsemide (DEMADEX) 20 MG tablet Take 40 mg by mouth daily.    [provider]  umeclidinium-vilanterol (ANORO ELLIPTA) 62.5-25 MCG/ACT AEPB Inhale 1 puff into the lungs daily. 04/23/22   Salena Saner, MD  VENTOLIN HFA 108 [phone] Base) MCG/ACT inhaler Inhale 2 puffs into the lungs every 6 (six) hours as needed for shortness of breath. 11/23/20   [provider]  Vitamin D, Ergocalciferol, (DRISDOL) 1.25 MG (50000 UNIT) CAPS capsule Take 50,000 Units by mouth once a week. 08/15/22   [provider]    Physical Exam: Vitals:   06/12/23 1300 06/12/23 1303 06/12/23 1304  BP: 110/76    Pulse: 97    Resp: 15    Temp: 97.9 F (36.6 C)    TempSrc: Oral    SpO2: 99% 95%   Weight:   133.8 kg  Height:   5\' 6"  (1.676 m)   Physical Exam Vitals and nursing note reviewed.  Constitutional:      General: He is not in acute distress.    Appearance: He is obese.  HENT:     Head: Normocephalic and atraumatic.     Mouth/Throat:     Mouth: Mucous  membranes are moist.     Pharynx:  Oropharynx is clear.  Cardiovascular:     Rate and Rhythm: Normal rate and regular rhythm.     Heart sounds: Heart sounds are distant.  Pulmonary:     Effort: Pulmonary effort is normal. No respiratory distress.     Breath sounds: Normal breath sounds. No wheezing, rhonchi or rales.  Musculoskeletal:     Right lower leg: No edema.     Left lower leg: No edema.  Skin:    General: Skin is warm and dry.  Neurological:     Mental Status: He is alert and oriented to person, place, and time. Mental status is at baseline.  Psychiatric:        Mood and Affect: Mood normal.        Behavior: Behavior normal.    Data Reviewed: CBC with WBC of 3.8, hemoglobin of 14.1, platelets of 144 CMP with sodium of 134, potassium 3.7, bicarb 24, glucose 161, BUN 35, creatinine 1.47, AST 38, ALT 32 and GFR 59 Magnesium 1.5 Troponin 88 BNP 464  INR 1.0 PTT 30  EKG personally reviewed.  Sinus rhythm with rate of 103.  Left bundle branch block.  Compared to EKG obtained 3 months prior, no changes noted.  CXR pending  Results are pending, will review when available.  Assessment and Plan:  * Ventricular tachycardia [facility]) Patient is presenting with sudden onset shortness of breath with chest pain that resolved secondary to AICD defibrillation in the setting of ventricular tachycardia.  Potentially triggered by hypomagnesemia versus self discontinuation of amiodarone in 2023.  Patient is also not currently on a beta-blocker.  Low suspicion at this time for ACS.  - Telemetry monitoring - Cardiology consulted; appreciate their recommendations - Per discussion between EDP and cardiology, amiodarone bolus and infusion was started; continue for now pending cardiology recommendations - S/p magnesium supplementation - Repeat BMP and magnesium in the a.m.  Chronic systolic heart failure (HCC) Patient has a history of HFrEF with last EF less than 20% (Feb 2024) in the setting  of nonischemic dilated cardiomyopathy.  He is compliant with his home torsemide and does not appear hypervolemic on examination.  BNP is elevated at 464, however this is the first time it has been checked since 2022. Current GDMT includes Entresto (once daily) and Jardiance. Per AHF clinic notes, chronic hypotension is limiting factor.   - Daily weights - Strict in and out - Continue home torsemide 40 mg daily - Continue home GDMT as tolerated  Demand ischemia (HCC) Troponin chronically elevated, however last stable in the low 50s, now currently 88.  Likely minimal demand in the setting of AICD firing and ventricular tachycardia.  No active chest pain and EKG stable.  Low suspicion for ACS at this time  - Repeat troponin pending  Chronic kidney disease, stage 3a (HCC) Renal function currently at baseline.  No further intervention indicated.  COPD (chronic obstructive pulmonary disease) (HCC) No wheezing or shortness of breath reported at this time.  - Continue home bronchodilators  Obstructive sleep apnea - CPAP at bedtime  Diabetes (HCC) Relatively well-controlled with last A1c of 7.3% in June 2024.  - Hold home Jardiance - SSI, moderate - A1c pending  Chronic respiratory failure with hypercapnia (HCC) Patient is currently saturating at 100% on room air.  - Continue home supplemental oxygen as needed  Advance Care Planning:   Code Status: Full Code verified by patient  Consults: Cardiology  Family Communication: Patient's fianc updated at bedside  Severity of Illness: The appropriate  patient status for this patient is OBSERVATION. Observation status is judged to be reasonable and necessary in order to provide the required intensity of service to ensure the patient's safety. The patient's presenting symptoms, physical exam findings, and initial radiographic and laboratory data in the context of their medical condition is felt to place them at decreased risk for further  clinical deterioration. Furthermore, it is anticipated that the patient will be medically stable for discharge from the hospital within 2 midnights of admission.   Author: Verdene Lennert, MD 06/12/2023 3:11 PM  For on call review [email or web address].

## 2023-06-12 NOTE — Assessment & Plan Note (Addendum)
Patient has a history of HFrEF with last EF less than 20% (Feb 2024) in the setting of nonischemic dilated cardiomyopathy.  He is compliant with his home torsemide and does not appear hypervolemic on examination.  BNP is elevated at 464, however this is the first time it has been checked since 2022. Current GDMT includes Entresto (once daily) and Jardiance. Per AHF clinic notes, chronic hypotension is limiting factor.   - Daily weights - Strict in and out - Continue home torsemide 40 mg daily - Continue home GDMT as tolerated

## 2023-06-12 NOTE — Assessment & Plan Note (Signed)
Troponin chronically elevated, however last stable in the low 50s, now currently 88.  Likely minimal demand in the setting of AICD firing and ventricular tachycardia.  No active chest pain and EKG stable.  Low suspicion for ACS at this time  - Repeat troponin pending

## 2023-06-12 NOTE — Assessment & Plan Note (Signed)
Renal function currently at baseline.  No further intervention indicated.

## 2023-06-12 NOTE — Assessment & Plan Note (Signed)
Patient is currently saturating at 100% on room air.  - Continue home supplemental oxygen as needed

## 2023-06-13 ENCOUNTER — Other Ambulatory Visit: Payer: Self-pay

## 2023-06-13 DIAGNOSIS — I471 Supraventricular tachycardia, unspecified: Secondary | ICD-10-CM | POA: Diagnosis not present

## 2023-06-13 DIAGNOSIS — I472 Ventricular tachycardia, unspecified: Secondary | ICD-10-CM | POA: Diagnosis not present

## 2023-06-13 LAB — BASIC METABOLIC PANEL
Anion gap: 9 (ref 5–15)
BUN: 36 mg/dL — ABNORMAL HIGH (ref 6–20)
CO2: 27 mmol/L (ref 22–32)
Calcium: 8.4 mg/dL — ABNORMAL LOW (ref 8.9–10.3)
Chloride: 99 mmol/L (ref 98–111)
Creatinine, Ser: 1.74 mg/dL — ABNORMAL HIGH (ref 0.61–1.24)
GFR, Estimated: 48 mL/min — ABNORMAL LOW (ref >60)
Glucose, Bld: 157 mg/dL — ABNORMAL HIGH (ref 70–99)
Potassium: 3.8 mmol/L (ref 3.5–5.1)
Sodium: 135 mmol/L (ref 135–145)

## 2023-06-13 LAB — TROPONIN I (HIGH SENSITIVITY)
Troponin I (High Sensitivity): 331 ng/L (ref <18)
Troponin I (High Sensitivity): 328 ng/L (ref <18)

## 2023-06-13 LAB — HIV ANTIBODY (ROUTINE TESTING W REFLEX): HIV Screen 4th Generation wRfx: NONREACTIVE

## 2023-06-13 LAB — CBG MONITORING, ED
Glucose-Capillary: 156 mg/dL — ABNORMAL HIGH (ref 70–99)
Glucose-Capillary: 158 mg/dL — ABNORMAL HIGH (ref 70–99)

## 2023-06-13 LAB — GLUCOSE, CAPILLARY
Glucose-Capillary: 159 mg/dL — ABNORMAL HIGH (ref 70–99)
Glucose-Capillary: 128 mg/dL — ABNORMAL HIGH (ref 70–99)

## 2023-06-13 LAB — MAGNESIUM: Magnesium: 2 mg/dL (ref 1.7–2.4)

## 2023-06-13 LAB — HEMOGLOBIN A1C
Hgb A1c MFr Bld: 7.3 % — ABNORMAL HIGH (ref 4.8–5.6)
Mean Plasma Glucose: 162.81 mg/dL

## 2023-06-13 MED ORDER — CARVEDILOL 3.125 MG PO TABS: 3.1250 mg | ORAL_TABLET | Freq: Two times a day (BID) | ORAL | 0 refills | Status: DC

## 2023-06-13 MED ORDER — AMIODARONE HCL 400 MG PO TABS: 400.0000 mg | ORAL_TABLET | Freq: Two times a day (BID) | ORAL | 0 refills | Status: DC

## 2023-06-13 MED ORDER — MAGNESIUM OXIDE -MG SUPPLEMENT 400 (240 MG) MG PO TABS: 400.0000 mg | ORAL_TABLET | Freq: Every day | ORAL | 0 refills | Status: DC

## 2023-06-13 MED ORDER — AMIODARONE HCL 200 MG PO TABS
400.0000 mg | ORAL_TABLET | Freq: Two times a day (BID) | ORAL | Status: DC
  Administered 2023-06-13: 400 mg via ORAL
  Filled 2023-06-13: qty 2

## 2023-06-13 MED ORDER — MAGNESIUM OXIDE -MG SUPPLEMENT 400 (240 MG) MG PO TABS: 400.0000 mg | ORAL_TABLET | Freq: Every day | ORAL | Status: DC

## 2023-06-13 NOTE — ED Notes (Signed)
This RN is making this note to correct an incorrect collection time in EPIC. The troponin of 331 that says it was collected on 06/03/23 @0037  is NOT CORRECT. This troponin was collected 06/13/23 @0037 . Lab was called and attempted to fix however it still is showing up as the incorrect time.

## 2023-06-13 NOTE — Discharge Summary (Incomplete)
Physician Discharge Summary   Patient: Oscar [name] MRN: [id number] DOB: 16-Aug-1976  Admit date:     06/12/2023  Discharge date: {dischdate:26783}  Discharge Physician: Loyce Dys   PCP: Preston Fleeting, MD   Recommendations at discharge:  {Tip this will not be part of the note when signed- Example include specific recommendations for outpatient follow-up, pending tests to follow-up on. (Optional):26781}  ***  Discharge Diagnoses: Principal Problem:   Ventricular tachycardia (HCC) Active Problems:   Chronic systolic heart failure (HCC)   Demand ischemia (HCC)   Chronic respiratory failure with hypercapnia (HCC)   Diabetes (HCC)   Obstructive sleep apnea   COPD (chronic obstructive pulmonary disease) (HCC)   Chronic kidney disease, stage 3a (HCC)  Resolved Problems:   * No resolved hospital problems. [facility] Course: No notes on file  Assessment and Plan: * Ventricular tachycardia [facility]) Patient is presenting with sudden onset shortness of breath with chest pain that resolved secondary to AICD defibrillation in the setting of ventricular tachycardia.  Potentially triggered by hypomagnesemia versus self discontinuation of amiodarone in 2023.  Patient is also not currently on a beta-blocker.  Low suspicion at this time for ACS.  - Telemetry monitoring - Cardiology consulted; appreciate their recommendations - Per discussion between EDP and cardiology, amiodarone bolus and infusion was started; continue for now pending cardiology recommendations - S/p magnesium supplementation - Repeat BMP and magnesium in the a.m.  Chronic systolic heart failure (HCC) Patient has a history of HFrEF with last EF less than 20% (Feb 2024) in the setting of nonischemic dilated cardiomyopathy.  He is compliant with his home torsemide and does not appear hypervolemic on examination.  BNP is elevated at 464, however this is the first time it has been checked since 2022. Current GDMT  includes Entresto (once daily) and Jardiance. Per AHF clinic notes, chronic hypotension is limiting factor.   - Daily weights - Strict in and out - Continue home torsemide 40 mg daily - Continue home GDMT as tolerated  Demand ischemia (HCC) Troponin chronically elevated, however last stable in the low 50s, now currently 88.  Likely minimal demand in the setting of AICD firing and ventricular tachycardia.  No active chest pain and EKG stable.  Low suspicion for ACS at this time  - Repeat troponin pending  Chronic kidney disease, stage 3a (HCC) Renal function currently at baseline.  No further intervention indicated.  COPD (chronic obstructive pulmonary disease) (HCC) No wheezing or shortness of breath reported at this time.  - Continue home bronchodilators  Obstructive sleep apnea - CPAP at bedtime  Diabetes (HCC) Relatively well-controlled with last A1c of 7.3% in June 2024.  - Hold home Jardiance - SSI, moderate - A1c pending  Chronic respiratory failure with hypercapnia (HCC) Patient is currently saturating at 100% on room air.  - Continue home supplemental oxygen as needed      {Tip this will not be part of the note when signed Body mass index is 47.61 kg/m. , ,  (Optional):26781}  {(NOTE) Pain control PDMP Statment (Optional):26782} Consultants: *** Procedures performed: ***  Disposition: {Plan; Disposition:26390} Diet recommendation:  {Diet_Plan:26776} DISCHARGE MEDICATION: Allergies as of 06/13/2023       Reactions   Ciprofloxacin Swelling, Other (See Comments)   Migraine Headache   Iodinated Contrast Media    Other reaction(s): Vomiting Projectile vomiting   Isosorb Dinitrate-hydralazine Other (See Comments)   Migraine Headache        Medication List  STOP taking these medications    allopurinol 100 MG tablet Commonly known as: ZYLOPRIM   meloxicam 15 MG tablet Commonly known as: MOBIC   metoprolol succinate 25 MG 24 hr  tablet Commonly known as: Toprol XL   omeprazole 20 MG capsule Commonly known as: PRILOSEC   tazarotene 0.1 % cream Commonly known as: AVAGE   Vitamin D (Ergocalciferol) 1.25 MG (50000 UNIT) Caps capsule Commonly known as: DRISDOL       TAKE these medications    amiodarone 400 MG tablet Commonly known as: PACERONE Take 1 tablet (400 mg total) by mouth 2 (two) times daily for 8 days.   Anoro Ellipta 62.5-25 MCG/ACT Aepb Generic drug: umeclidinium-vilanterol Inhale 1 puff into the lungs daily.   atorvastatin 40 MG tablet Commonly known as: LIPITOR Take 40 mg by mouth daily.   carvedilol 3.125 MG tablet Commonly known as: COREG Take 1 tablet (3.125 mg total) by mouth 2 (two) times daily with a meal.   clindamycin 1 % lotion Commonly known as: Cleocin-T Apply to affected areas daily after shower.   colchicine 0.6 MG tablet Take 0.6 mg by mouth as needed (gout).   Entresto 24-26 MG Generic drug: sacubitril-valsartan Take 0.5 tablets by mouth 2 (two) times daily.   Jardiance 10 MG Tabs tablet Generic drug: empagliflozin Take 10 mg by mouth daily.   ketoconazole 2 % cream Commonly known as: NIZORAL Apply twice daily to neck until clear   mupirocin ointment 2 % Commonly known as: BACTROBAN Apply topically 3 (three) times daily.   NON FORMULARY Pt uses a cpap nightly   pantoprazole 40 MG tablet Commonly known as: PROTONIX Take 40 mg by mouth daily.   torsemide 20 MG tablet Commonly known as: DEMADEX Take 40 mg by mouth daily.        Follow-up Information     Callwood, Dwayne D, MD Follow up in 1 week(s).   Specialties: Cardiology, Internal Medicine Contact information: [address] [place] [address] [phone]                Discharge Exam: [name] Weights   06/12/23 1304  Weight: 133.8 kg   ***  Condition at discharge: {DC Condition:26389}  The results of significant diagnostics from this hospitalization (including  imaging, microbiology, ancillary and laboratory) are listed below for reference.   Imaging Studies: DG Chest Portable 1 View  Result Date: 06/12/2023 CLINICAL DATA:  Chest pain EXAM: PORTABLE CHEST 1 VIEW COMPARISON:  10/03/2021 FINDINGS: Cardiomegaly with central vascular congestion and probable mild interstitial edema. No pleural effusion or pneumothorax. Left-sided pacing device as before. IMPRESSION: Cardiomegaly with central vascular congestion and probable mild interstitial edema. Electronically Signed   By: Jasmine Pang M.D.   On: 06/12/2023 15:26    Microbiology: Results for orders placed or performed during the hospital encounter of 10/03/21  Resp Panel by RT-PCR (Flu A&B, Covid) Nasopharyngeal Swab     Status: None   Collection Time: 10/03/21 12:09 PM   Specimen: Nasopharyngeal Swab; Nasopharyngeal(NP) swabs in vial transport medium  Result Value Ref Range Status   SARS Coronavirus 2 by RT PCR NEGATIVE NEGATIVE Final    Comment: (NOTE) SARS-CoV-2 target nucleic acids are NOT DETECTED.  The SARS-CoV-2 RNA is generally detectable in upper respiratory specimens during the acute phase of infection. The lowest concentration of SARS-CoV-2 viral copies this assay can detect is 138 copies/mL. A negative result does not preclude SARS-Cov-2 infection and should not be used as the sole basis  for treatment or other patient management decisions. A negative result may occur with  improper specimen collection/handling, submission of specimen other than nasopharyngeal swab, presence of viral mutation(s) within the areas targeted by this assay, and inadequate number of viral copies(<138 copies/mL). A negative result must be combined with clinical observations, patient history, and epidemiological information. The expected result is Negative.  Fact Sheet for Patients:  BloggerCourse.com  Fact Sheet for Healthcare Providers:   SeriousBroker.it  This test is no t yet approved or cleared by the [place] FDA and  has been authorized for detection and/or diagnosis of SARS-CoV-2 by FDA under an Emergency Use Authorization (EUA). This EUA will remain  in effect (meaning this test can be used) for the duration of the COVID-19 declaration under Section 564(b)(1) of the Act, 21 U.S.C.section 360bbb-3(b)(1), unless the authorization is terminated  or revoked sooner.       Influenza A by PCR NEGATIVE NEGATIVE Final   Influenza B by PCR NEGATIVE NEGATIVE Final    Comment: (NOTE) The Xpert Xpress SARS-CoV-2/FLU/RSV plus assay is intended as an aid in the diagnosis of influenza from Nasopharyngeal swab specimens and should not be used as a sole basis for treatment. Nasal washings and aspirates are unacceptable for Xpert Xpress SARS-CoV-2/FLU/RSV testing.  Fact Sheet for Patients: BloggerCourse.com  Fact Sheet for Healthcare Providers: SeriousBroker.it  This test is not yet approved or cleared by the [place] FDA and has been authorized for detection and/or diagnosis of SARS-CoV-2 by FDA under an Emergency Use Authorization (EUA). This EUA will remain in effect (meaning this test can be used) for the duration of the COVID-19 declaration under Section 564(b)(1) of the Act, 21 U.S.C. section 360bbb-3(b)(1), unless the authorization is terminated or revoked.  Performed at [facility] [address] [place], [place] [address]     Labs: CBC: Recent Labs  Lab 06/12/23 1306  WBC 3.8*  HGB 14.1  HCT 40.9  MCV 93.8  PLT 144*   Basic Metabolic Panel: Recent Labs  Lab 06/12/23 1306 06/13/23 0356  NA 134* 135  K 3.7 3.8  CL 98 99  CO2 24 27  GLUCOSE 161* 157*  BUN 35* 36*  CREATININE 1.47* 1.74*  CALCIUM 8.5* 8.4*  MG 1.5* 2.0   Liver Function Tests: Recent Labs  Lab 06/12/23 1306  AST 38  ALT 32   ALKPHOS 182*  BILITOT 0.6  PROT 7.9  ALBUMIN 3.5   CBG: Recent Labs  Lab 06/12/23 1859 06/13/23 0045 06/13/23 0753 06/13/23 0837  GLUCAP 114* 156* 158* 159*    Discharge time spent: {LESS THAN/GREATER IONG:29528} 30 minutes.  Signed: Loyce Dys, MD [facility] 06/13/2023

## 2023-06-13 NOTE — Progress Notes (Signed)
[facility] Cardiology  SUBJECTIVE: Patient laying in bed, denies chest pain or shortness of breath   Vitals:   06/13/23 0430 06/13/23 0530 06/13/23 0600 06/13/23 0824  BP: 92/74 107/84 102/84 92/71  Pulse: 80 81 73 73  Resp: 20 (!) 23 19 18   Temp:    97.9 F (36.6 C)  TempSrc:    Oral  SpO2: 100% 97% 100% 98%  Weight:      Height:        No intake or output data in the 24 hours ending 06/13/23 1125    PHYSICAL EXAM  General: Well developed, well nourished, in no acute distress HEENT:  Normocephalic and atramatic Neck:  No JVD.  Lungs: Clear bilaterally to auscultation and percussion. Heart: HRRR . Normal S1 and S2 without gallops or murmurs.  Abdomen: Bowel sounds are positive, abdomen soft and non-tender  Msk:  Back normal, normal gait. Normal strength and tone for age. Extremities: No clubbing, cyanosis or edema.   Neuro: Alert and oriented X 3. Psych:  Good affect, responds appropriately   LABS: Basic Metabolic Panel: Recent Labs    06/12/23 1306 06/13/23 0356  NA 134* 135  K 3.7 3.8  CL 98 99  CO2 24 27  GLUCOSE 161* 157*  BUN 35* 36*  CREATININE 1.47* 1.74*  CALCIUM 8.5* 8.4*  MG 1.5* 2.0   Liver Function Tests: Recent Labs    06/12/23 1306  AST 38  ALT 32  ALKPHOS 182*  BILITOT 0.6  PROT 7.9  ALBUMIN 3.5   No results for input(s): "LIPASE", "AMYLASE" in the last 72 hours. CBC: Recent Labs    06/12/23 1306  WBC 3.8*  HGB 14.1  HCT 40.9  MCV 93.8  PLT 144*   Cardiac Enzymes: No results for input(s): "CKTOTAL", "CKMB", "CKMBINDEX", "TROPONINI" in the last 72 hours. BNP: Invalid input(s): "POCBNP" D-Dimer: No results for input(s): "DDIMER" in the last 72 hours. Hemoglobin A1C: Recent Labs    06/13/23 0356  HGBA1C 7.3*   Fasting Lipid Panel: No results for input(s): "CHOL", "HDL", "LDLCALC", "TRIG", "CHOLHDL", "LDLDIRECT" in the last 72 hours. Thyroid Function Tests: No results for input(s): "TSH", "T4TOTAL", "T3FREE", "THYROIDAB" in  the last 72 hours.  Invalid input(s): "FREET3" Anemia Panel: No results for input(s): "VITAMINB12", "FOLATE", "FERRITIN", "TIBC", "IRON", "RETICCTPCT" in the last 72 hours.  DG Chest Portable 1 View  Result Date: 06/12/2023 CLINICAL DATA:  Chest pain EXAM: PORTABLE CHEST 1 VIEW COMPARISON:  10/03/2021 FINDINGS: Cardiomegaly with central vascular congestion and probable mild interstitial edema. No pleural effusion or pneumothorax. Left-sided pacing device as before. IMPRESSION: Cardiomegaly with central vascular congestion and probable mild interstitial edema. Electronically Signed   By: Jasmine Pang M.D.   On: 06/12/2023 15:26     Echo LVEF less than 20% 10/20/2022  TELEMETRY: Sinus rhythm 87 bpm with left bundle branch block:  ASSESSMENT AND PLAN:  Principal Problem:   Ventricular tachycardia (HCC) Active Problems:   Chronic respiratory failure with hypercapnia (HCC)   Chronic systolic heart failure (HCC)   Diabetes (HCC)   Obstructive sleep apnea   COPD (chronic obstructive pulmonary disease) (HCC)   Chronic kidney disease, stage 3a (HCC)   Demand ischemia (HCC)    1. Ventricular tachycardia, with appropriate ATP and shock x 3 with successful conversion to sinus tachycardia with underlying chronic left bundle branch block 2.  Chronic HFrEF, LVEF less than 20% 10/20/2022, patient appears euvolemic without recent signs or symptoms of CHF decompensation, on good medical management with  Entresto, torsemide, empagliflozin 3.  Nonischemic dilated cardiomyopathy, with normal coronary anatomy by cardiac catheterization 12/09/2014 4.  Paroxysmal atrial fibrillation, currently not anticoagulated per patient's wishes 5.  Chronic kidney disease stage IIIa, stable   Recommendations   1.  Agree with current therapy 2.  Transition amiodarone infusion to amiodarone 400 mg p.o. twice daily x 9 days, then reduce to 200 mg twice daily 3.  May discharge home 4.  Follow-up Dr. Juliann Pares 1 week 5.   Consider upgrade dual-chamber ICD to CRT-D per Dr. Gerre Pebbles at [facility] as outpatient   Marcina Millard, MD, PhD, [facility] 06/13/2023 11:25 AM

## 2023-06-13 NOTE — Discharge Summary (Addendum)
Physician Discharge Summary   Patient: Oscar [name] MRN: [id number] DOB: 03/20/1976  Admit date:     06/12/2023  Discharge date: 06/13/23  Discharge Physician: Loyce Dys   PCP: Preston Fleeting, MD   Recommendations at discharge:  Follow-up with cardiology  Discharge Diagnoses: Ventricular tachycardia (HCC) status post successful conversion to sinus rhythm after 3 shocks Chronic systolic heart failure (HCC) Demand ischemia (HCC) Chronic kidney disease, stage 3a (HCC) COPD (chronic obstructive pulmonary disease) (HCC) Obstructive sleep apnea Diabetes (HCC) Chronic respiratory failure with hypercapnia [facility])   Hospital Course: Oscar [name] is a 47 y.o. male with medical history significant of HFrEF EF less than 20% secondary to nonischemic dilated cardiomyopathy, type 2 diabetes, CKD stage IIIa, chronic hypoxic respiratory failure on as needed 3 L of supplemental oxygen, paroxysmal atrial fibrillation not on AC, OSA on CPAP, hypertension, hyperlipidemia, gout, COPD, who presents to the ED due to ICD firing.   [name] states he was in his usual state of health this morning sitting on the couch watching television when he noticed sudden onset shortness of breath, chest tightness and dizziness.  He then felt his ICD fired suddenly.  He was able to stand up and make it to the bathroom when he felt his ICD firing again.  Afterwards, his shortness of breath, chest pain and dizziness resolved.  He called EMS and on route to the ED, he was told that his ICD fired again.  He was seen by cardiologist and was found to have ventricular tachycardia status post successful conversion to sinus rhythm after 3 shocks.  He required amiodarone drip which has been transitioned to oral amiodarone 400 mg twice daily for 80 days as recommended by cardiologist.  Patient has been cleared for discharge today and to follow-up as an outpatient.  Consultants: Cardiology Procedures performed: As  shown above Disposition: Home Diet recommendation:  Cardiac diet DISCHARGE MEDICATION: Allergies as of 06/13/2023       Reactions   Ciprofloxacin Swelling, Other (See Comments)   Migraine Headache   Iodinated Contrast Media    Other reaction(s): Vomiting Projectile vomiting   Isosorb Dinitrate-hydralazine Other (See Comments)   Migraine Headache        Medication List     STOP taking these medications    allopurinol 100 MG tablet Commonly known as: ZYLOPRIM   meloxicam 15 MG tablet Commonly known as: MOBIC   metoprolol succinate 25 MG 24 hr tablet Commonly known as: Toprol XL   omeprazole 20 MG capsule Commonly known as: PRILOSEC   tazarotene 0.1 % cream Commonly known as: AVAGE   Vitamin D (Ergocalciferol) 1.25 MG (50000 UNIT) Caps capsule Commonly known as: DRISDOL       TAKE these medications    amiodarone 400 MG tablet Commonly known as: PACERONE Take 1 tablet (400 mg total) by mouth 2 (two) times daily for 8 days.   Anoro Ellipta 62.5-25 MCG/ACT Aepb Generic drug: umeclidinium-vilanterol Inhale 1 puff into the lungs daily.   atorvastatin 40 MG tablet Commonly known as: LIPITOR Take 40 mg by mouth daily.   carvedilol 3.125 MG tablet Commonly known as: COREG Take 1 tablet (3.125 mg total) by mouth 2 (two) times daily with a meal.   clindamycin 1 % lotion Commonly known as: Cleocin-T Apply to affected areas daily after shower.   colchicine 0.6 MG tablet Take 0.6 mg by mouth as needed (gout).   Entresto 24-26 MG Generic drug: sacubitril-valsartan Take 0.5 tablets by mouth 2 (  two) times daily.   Jardiance 10 MG Tabs tablet Generic drug: empagliflozin Take 10 mg by mouth daily.   ketoconazole 2 % cream Commonly known as: NIZORAL Apply twice daily to neck until clear   magnesium oxide 400 (240 Mg) MG tablet Commonly known as: MAG-OX Take 1 tablet (400 mg total) by mouth daily.   mupirocin ointment 2 % Commonly known as:  BACTROBAN Apply topically 3 (three) times daily.   NON FORMULARY Pt uses a cpap nightly   pantoprazole 40 MG tablet Commonly known as: PROTONIX Take 40 mg by mouth daily.   torsemide 20 MG tablet Commonly known as: DEMADEX Take 40 mg by mouth daily.        Follow-up Information     Callwood, Dwayne D, MD Follow up in 1 week(s).   Specialties: Cardiology, Internal Medicine Contact information: [address] [place] [address] [phone]                Discharge Exam: [name] Weights   06/12/23 1304  Weight: 133.8 kg   General: Well developed, well nourished, in no acute distress HEENT:  Normocephalic and atramatic Neck:   No JVD.  Lungs: Clear bilaterally to auscultation and percussion. Heart: HRRR . Normal S1 and S2 without gallops or murmurs.  Abdomen: Bowel sounds are positive, abdomen soft and non-tender  Msk:  Back normal, normal gait. Normal strength and tone for age. Extremities: No clubbing, cyanosis or edema.   Neuro: Alert and oriented X 3. Psych:  Good affect, responds appropriately  Condition at discharge: good  The results of significant diagnostics from this hospitalization (including imaging, microbiology, ancillary and laboratory) are listed below for reference.   Imaging Studies: DG Chest Portable 1 View  Result Date: 06/12/2023 CLINICAL DATA:  Chest pain EXAM: PORTABLE CHEST 1 VIEW COMPARISON:  10/03/2021 FINDINGS: Cardiomegaly with central vascular congestion and probable mild interstitial edema. No pleural effusion or pneumothorax. Left-sided pacing device as before. IMPRESSION: Cardiomegaly with central vascular congestion and probable mild interstitial edema. Electronically Signed   By: Jasmine Pang M.D.   On: 06/12/2023 15:26    Microbiology: Results for orders placed or performed during the hospital encounter of 10/03/21  Resp Panel by RT-PCR (Flu A&B, Covid) Nasopharyngeal Swab     Status: None   Collection Time:  10/03/21 12:09 PM   Specimen: Nasopharyngeal Swab; Nasopharyngeal(NP) swabs in vial transport medium  Result Value Ref Range Status   SARS Coronavirus 2 by RT PCR NEGATIVE NEGATIVE Final    Comment: (NOTE) SARS-CoV-2 target nucleic acids are NOT DETECTED.  The SARS-CoV-2 RNA is generally detectable in upper respiratory specimens during the acute phase of infection. The lowest concentration of SARS-CoV-2 viral copies this assay can detect is 138 copies/mL. A negative result does not preclude SARS-Cov-2 infection and should not be used as the sole basis for treatment or other patient management decisions. A negative result may occur with  improper specimen collection/handling, submission of specimen other than nasopharyngeal swab, presence of viral mutation(s) within the areas targeted by this assay, and inadequate number of viral copies(<138 copies/mL). A negative result must be combined with clinical observations, patient history, and epidemiological information. The expected result is Negative.  Fact Sheet for Patients:  BloggerCourse.com  Fact Sheet for Healthcare Providers:  SeriousBroker.it  This test is no t yet approved or cleared by the [place] FDA and  has been authorized for detection and/or diagnosis of SARS-CoV-2 by FDA under an Emergency Use Authorization (EUA). This  EUA will remain  in effect (meaning this test can be used) for the duration of the COVID-19 declaration under Section 564(b)(1) of the Act, 21 U.S.C.section 360bbb-3(b)(1), unless the authorization is terminated  or revoked sooner.       Influenza A by PCR NEGATIVE NEGATIVE Final   Influenza B by PCR NEGATIVE NEGATIVE Final    Comment: (NOTE) The Xpert Xpress SARS-CoV-2/FLU/RSV plus assay is intended as an aid in the diagnosis of influenza from Nasopharyngeal swab specimens and should not be used as a sole basis for treatment. Nasal washings  and aspirates are unacceptable for Xpert Xpress SARS-CoV-2/FLU/RSV testing.  Fact Sheet for Patients: BloggerCourse.com  Fact Sheet for Healthcare Providers: SeriousBroker.it  This test is not yet approved or cleared by the [place] FDA and has been authorized for detection and/or diagnosis of SARS-CoV-2 by FDA under an Emergency Use Authorization (EUA). This EUA will remain in effect (meaning this test can be used) for the duration of the COVID-19 declaration under Section 564(b)(1) of the Act, 21 U.S.C. section 360bbb-3(b)(1), unless the authorization is terminated or revoked.  Performed at [facility], [address] [place], [place] [address]     Labs: CBC: Recent Labs  Lab 06/12/23 1306  WBC 3.8*  HGB 14.1  HCT 40.9  MCV 93.8  PLT 144*   Basic Metabolic Panel: Recent Labs  Lab 06/12/23 1306 06/13/23 0356  NA 134* 135  K 3.7 3.8  CL 98 99  CO2 24 27  GLUCOSE 161* 157*  BUN 35* 36*  CREATININE 1.47* 1.74*  CALCIUM 8.5* 8.4*  MG 1.5* 2.0   Liver Function Tests: Recent Labs  Lab 06/12/23 1306  AST 38  ALT 32  ALKPHOS 182*  BILITOT 0.6  PROT 7.9  ALBUMIN 3.5   CBG: Recent Labs  Lab 06/12/23 1859 06/13/23 0045 06/13/23 0753 06/13/23 0837 06/13/23 1157  GLUCAP 114* 156* 158* 159* 128*    Discharge time spent:  .  Signed: Loyce Dys, MD [facility] 06/13/2023   Addendum at 4 PM Initially cardiologist had instructed to discharge patient on amiodarone 400mg  twice daily for 8 days. After reviewing cardiology documentation later in the day I saw amiodarone 400 mg to be given for 8 days and then  200 mg twice daily thereafter.. I have called patient's pharmacy and added a prescription for 200 mg amiodarone twice daily after completion of the 400 mg course.

## 2023-06-22 ENCOUNTER — Encounter: Payer: Self-pay | Admitting: Surgery

## 2023-06-22 ENCOUNTER — Ambulatory Visit (INDEPENDENT_AMBULATORY_CARE_PROVIDER_SITE_OTHER): Payer: 59 | Admitting: Surgery

## 2023-06-22 ENCOUNTER — Other Ambulatory Visit: Payer: Self-pay | Admitting: Dermatology

## 2023-06-22 VITALS — BP 95/70 | HR 73 | Temp 98.0°F | Ht 65.0 in | Wt 298.0 lb

## 2023-06-22 DIAGNOSIS — L732 Hidradenitis suppurativa: Secondary | ICD-10-CM

## 2023-06-22 DIAGNOSIS — L739 Follicular disorder, unspecified: Secondary | ICD-10-CM

## 2023-06-22 DIAGNOSIS — B360 Pityriasis versicolor: Secondary | ICD-10-CM

## 2023-06-22 MED ORDER — SULFAMETHOXAZOLE-TRIMETHOPRIM 800-160 MG PO TABS: 1.0000 | ORAL_TABLET | Freq: Two times a day (BID) | ORAL | 0 refills | Status: AC

## 2023-06-22 NOTE — Patient Instructions (Addendum)
Please call Urology to schedule an appointment. [phone].  We would like for you to follow up with Dermatology for your hidradenitis. [phone].   Hidradenitis Suppurativa Hidradenitis suppurativa is a long-term (chronic) skin disease. It is similar to a severe form of acne, but it affects areas of the body where acne would be unusual, especially areas of the body where skin rubs against skin and becomes moist. These include: Underarms. Groin. Genital area. Buttocks. Upper thighs. Breasts. Hidradenitis suppurativa may start out as small lumps or pimples caused by blocked skin pores, sweat glands, or hair follicles. Pimples may develop into deep sores that break open (rupture) and drain pus. Over time, affected areas of skin may thicken and become scarred. This condition is rare and does not spread from person to person (non-contagious). What are the causes? The exact cause of this condition is not known. It may be related to: Male and male hormones. An overactive disease-fighting system (immune system). The immune system may over-react to blocked hair follicles or sweat glands and cause swelling and pus-filled sores. What increases the risk? You are more likely to develop this condition if you: Are male. Are 86-96 years old. Have a family history of hidradenitis suppurativa. Have a personal history of acne. Are overweight. Smoke. Take the medicine lithium. What are the signs or symptoms? The first symptoms are usually painful bumps in the skin, similar to pimples. The condition may get worse over time (progress), or it may only cause mild symptoms. If the disease progresses, symptoms may include: Skin bumps getting bigger and growing deeper into the skin. Bumps rupturing and draining pus. Itchy, infected skin. Skin getting thicker and scarred. Tunnels under the skin (fistulas) where pus drains from a bump. Pain during daily activities, such as pain during walking if your  groin area is affected. Emotional problems, such as stress or depression. This condition may affect your appearance and your ability or willingness to wear certain clothes or do certain activities. How is this diagnosed? This condition is diagnosed by a health care provider who specializes in skin conditions (dermatologist). You may be diagnosed based on: Your symptoms and medical history. A physical exam. Testing a pus sample for infection. Blood tests. How is this treated? Your treatment will depend on how severe your symptoms are. The same treatment will not work for everybody with this condition. You may need to try several treatments to find what works best for you. Treatment may include: Cleaning and bandaging (dressing) your wounds as needed. Lifestyle changes, such as new skin care routines. Taking medicines, such as: Antibiotics. Acne medicines. Medicines to reduce the activity of the immune system. A diabetes medicine (metformin). Birth control pills, for women. Steroids to reduce swelling and pain. Working with a mental health care provider, if you experience emotional distress due to this condition. If you have severe symptoms that do not get better with medicine, you may need surgery. Surgery may involve: Using a laser to clear the skin and remove hair follicles. Opening and draining deep sores. Removing the areas of skin that are diseased and scarred. Follow these instructions at home: Medicines  Take over-the-counter and prescription medicines only as told by your health care provider. If you were prescribed antibiotics, take them as told by your health care provider. Do not stop using the antibiotic even if your condition improves. Skin care If you have open wounds, cover them with a clean dressing as told by your health care provider. Keep wounds clean by washing  them gently with soap and water when you bathe. Do not shave the areas where you get hidradenitis  suppurativa. Wear loose-fitting clothes. Try to avoid getting overheated or sweaty. If you get sweaty or wet, change into clean, dry clothes as soon as you can. To help relieve pain and itchiness, cover sore areas with a warm, clean washcloth (warm compress) for 5-10 minutes as often as needed. Your healthcare provider may recommend an antiperspirant deodorant that may be gentle on your skin. A daily antiseptic wash to cleanse affected areas may be suggested by your healthcare provider. General instructions Learn as much as you can about your disease so that you have an active role in your treatment. Work closely with your health care provider to find treatments that work for you. If you are overweight, work with your health care provider to lose weight as recommended. Do not use any products that contain nicotine or tobacco. These products include cigarettes, chewing tobacco, and vaping devices, such as e-cigarettes. If you need help quitting, ask your health care provider. If you struggle with living with this condition, talk with your health care provider or work with a mental health care provider as recommended. Keep all follow-up visits. Where to find more information Hidradenitis Suppurativa Foundation, Inc.: www.hs-foundation.org American Academy of Dermatology: InfoExam.si Contact a health care provider if: You have a flare-up of hidradenitis suppurativa. You have a fever or chills. You have trouble controlling your symptoms at home. You have trouble doing your daily activities because of your symptoms. You have trouble dealing with emotional problems related to your condition. Summary Hidradenitis suppurativa is a long-term (chronic) skin disease. It is similar to a severe form of acne, but it affects areas of the body where acne would be unusual. The first symptoms are usually painful bumps in the skin, similar to pimples. The condition may only cause mild symptoms, or it may get  worse over time (progress). If you have open wounds, cover them with a clean dressing as told by your health care provider. Keep wounds clean by washing them gently with soap and water when you bathe. Besides skin care, treatment may include medicines, laser treatment, and surgery. This information is not intended to replace advice given to you by your health care provider. Make sure you discuss any questions you have with your health care provider. Document Revised: 10/09/2021 Document Reviewed: 10/09/2021 Elsevier Patient Education  2024 [employer].

## 2023-06-22 NOTE — Progress Notes (Unsigned)
PCP: Presley Raddle, MD (last seen 03/24) Primary Cardiologist: Dorothyann Peng, MD (last seen 10/24) HF provider: Cheral Bay, MD (last seen 05/24)  HPI:  [name] is a 47 y/o male with a history of obstructive sleep apnea (currently with CPAP), MI, HTN, VT (successful shock X 3), CKD, COPD, PAF, gout, hyperlipidemia, GERD, DM, SVT,asthma, chronic LBBB and chronic heart failure with CRT-D (2016).  Was in the ED 04/27/23 due to groin abscess X 1 month with drainage. CT done with f/u to be done with surgeon. Admitted 06/12/23 due to ICD firing after sudden onset of SOB, chest tightness and dizziness. He was able to stand up and make it to the bathroom when he felt his ICD firing again. Afterwards, his shortness of breath, chest pain and dizziness resolved. He called EMS and on route to the ED, he was told that his ICD fired again. Successful conversion after the 3rd shock. Cardiology consulted. Needed amiodarone drip with transition to or amiodarone.   Echo 02/06/15: EF of 25% which is unchanged from previous echo on 12/10/14 which showed an EF of 25% and mild MR. Echo 06/23/18: EF of 20-25% along with mild MR.  Echo 03/01/2019: EF of 15% along with mild MR.  Echo 02/27/20: EF of <20% Echo 11/23/20: EF of <20% along with moderate LVH and moderately elevated PA pressure. Echo 10/20/22: EF <20% along with mildly elevated PA pressure and mild MR.    Stress test 03/01/2019: Abnormal myocardial perfusion scan no clear evidence of reversible ischemia inferior persistent defect either scar versus body habitus artifact severely dilated left ventricular function severely depressed LV function with ejection fraction of around 11%. Recommend medical therapy   He presents today for a HF follow-up visit with a chief complaint of minimal SOB with moderate exertion. Chronic in nature although he says that he's been feeling "much better". Has associated fatigue and soreness over ICD since he was shocked 3 times.  Denies chest pain, cough, palpitations, abdominal distention, pedal edema, dizziness, difficulty sleeping or weight gain. Has not smoked cigarettes in the last month although is chewing a piece of nicotine gum every 2 hours.    Taking entresto as 1 tablet every morning as 1 tablet BID caused him to become hypotensive.   Saw Dr Edwena Blow at the [name] 05/24 and referral was made to Dr. Maisie Fus who did his original device in 2016 to see about doing a CRT upgrade.   ROS: All systems negative except as listed in HPI, PMH and Problem List.  SH:  Social History   Socioeconomic History   Marital status: Single    Spouse name: Not on file   Number of children: Not on file   Years of education: Not on file   Highest education level: Not on file  Occupational History   Occupation: unemployed  Tobacco Use   Smoking status: Every Day    Current packs/day: 1.50    Average packs/day: 1.5 packs/day for 34.0 years (51.0 ttl pk-yrs)    Types: Cigarettes    Passive exposure: Past   Smokeless tobacco: Never   Tobacco comments:    1/2 ppd [name], [name] 04/23/22   Vaping Use   Vaping status: Never Used  Substance and Sexual Activity   Alcohol use: No   Drug use: No   Sexual activity: Yes  Other Topics Concern   Not on file  Social History Narrative   Live alone   Social Determinants of Corporate investment banker  Strain: Not on file  Food Insecurity: No Food Insecurity (06/13/2023)   Hunger Vital Sign    Worried About Running Out of Food in the Last Year: Never true    Ran Out of Food in the Last Year: Never true  Transportation Needs: No Transportation Needs (06/13/2023)   PRAPARE - Administrator, Civil Service (Medical): No    Lack of Transportation (Non-Medical): No  Physical Activity: Not on file  Stress: Not on file  Social Connections: Not on file  Intimate Partner Violence: Not At Risk (06/13/2023)   Humiliation, Afraid, Rape, and Kick questionnaire     Fear of Current or Ex-Partner: No    Emotionally Abused: No    Physically Abused: No    Sexually Abused: No    FH:  Family History  Problem Relation Age of Onset   Hypertension Mother    Congestive Heart Failure Mother    Hypertension Sister    Diabetes Sister    Pancreatitis Sister    COPD Sister    Emphysema Sister        smoked   Pancreatitis Brother    Anemia Neg Hx    Arrhythmia Neg Hx    Asthma Neg Hx    Clotting disorder Neg Hx    Fainting Neg Hx    Heart attack Neg Hx    Heart disease Neg Hx    Heart failure Neg Hx    Hyperlipidemia Neg Hx     Past Medical History:  Diagnosis Date   AICD (automatic cardioverter/defibrillator) present    Aortic atherosclerosis (HCC)    Asthma    Chronic respiratory failure (HCC)    CKD (chronic kidney disease), stage III (HCC)    COPD (chronic obstructive pulmonary disease) (HCC)    Deafness in right ear    Diabetes mellitus without complication (HCC)    Dyspnea    GERD (gastroesophageal reflux disease)    Gout    HFrEF (heart failure with reduced ejection fraction) (HCC)    a.) TTE 12/10/14: EF 25%; diff inf HK; sev LV and mod LA dil; LVH. b.) TTE 06/23/18: EF 20-25%; LVH; mild LV dil, mild BAE. c.) TTE 03/01/19: EF 15%, LVH, BAE; triv PR/TR. d.) TTE 02/27/20: EF < 20%; sev LV dil; mild MR. e.) TTE 11/23/20: EF < 20%; glob HK; sev LV dil; LVH; mild-mod BAE; mod-sev TR, triv AR; G1DD. f.) TTE 05/14/21: EF <15%; LVH; sev LA and mild RV enlar; triv AR/PR, mild TR, mod MR.   Hiatal hernia    History of cardiac catheterization    a.) R/LHC 12/14/2009: normal coronaries. b.) R/LHC 12/11/2014: normal coronaries.   Hyperlipidemia    Hypertension    Hypoxemia    LBBB (left bundle branch block)    NICM (nonischemic cardiomyopathy; dilated cardiomyopathy) (HCC)    a.) R/LHC 12/14/2009: normal cors; LVEDP 18 mmHg, mean PA 29 mmHg, mean PCWP 31 mmHg; CO 6 L/min; CI 2.54 L/min/m. b.) TTE 12/10/2014: EF 25%. c.) R/LHC 12/11/2014: mean RA 9  mmHg, mean PA 22 mmHg, mean PCWP 22 mmHg. d.) TTE 06/23/2018: EF 20-25%. e.) TTE 03/01/2019: EF 15%. f.) TTE 02/27/2020: < 20%. g.) TTE 11/23/2020: EF <20%. h.) TTE 05/14/2021: < 15%.   NSTEMI (non-ST elevated myocardial infarction) (HCC)    a.) x 3 per patient report ---> 2012, 2014, 2016   Obesity    On supplemental oxygen by nasal cannula    a.) 2-3 L/[name]   OSA on CPAP  PAF (paroxysmal atrial fibrillation) (HCC)    a.) CHA2DS2-VASc = 4 (HFrEF, HTN, prior MI, T2DM). b.) rate/rhythm maintained without pharmacologial intervention; no current anticoagulation.   Pancreatitis    PSVT (paroxysmal supraventricular tachycardia) (HCC)     Current Outpatient Medications  Medication Sig Dispense Refill   atorvastatin (LIPITOR) 40 MG tablet Take 40 mg by mouth daily.     carvedilol (COREG) 3.125 MG tablet Take 1 tablet (3.125 mg total) by mouth 2 (two) times daily with a meal. 60 tablet 0   clindamycin (CLEOCIN-T) 1 % lotion Apply to affected areas daily after shower. 60 mL 3   colchicine 0.6 MG tablet Take 0.6 mg by mouth as needed (gout).     ENTRESTO 24-26 MG Take 0.5 tablets by mouth 2 (two) times daily.     JARDIANCE 10 MG TABS tablet Take 10 mg by mouth daily.     ketoconazole (NIZORAL) 2 % cream Apply twice daily to neck until clear 60 g 2   magnesium oxide (MAG-OX) 400 (240 Mg) MG tablet Take 1 tablet (400 mg total) by mouth daily. 30 tablet 0   mupirocin ointment (BACTROBAN) 2 % Apply topically 3 (three) times daily.     NON FORMULARY Pt uses a cpap nightly     pantoprazole (PROTONIX) 40 MG tablet Take 40 mg by mouth daily.     sulfamethoxazole-trimethoprim (BACTRIM DS) 800-160 MG tablet Take 1 tablet by mouth 2 (two) times daily for 10 days. 20 tablet 0   torsemide (DEMADEX) 20 MG tablet Take 40 mg by mouth daily.     umeclidinium-vilanterol (ANORO ELLIPTA) 62.5-25 MCG/ACT AEPB Inhale 1 puff into the lungs daily. 60 each 6   No current facility-administered medications for this  visit.   Vitals:   06/23/23 1115  BP: 93/80  Pulse: 70  Resp: 14  SpO2: 98%  Weight: (!) 300 lb 6 oz (136.2 kg)   Wt Readings from Last 3 Encounters:  06/23/23 (!) 300 lb 6 oz (136.2 kg)  06/22/23 298 lb (135.2 kg)  06/12/23 295 lb (133.8 kg)   Lab Results  Component Value Date   CREATININE 1.74 (H) 06/13/2023   CREATININE 1.47 (H) 06/12/2023   CREATININE 1.76 (H) 04/27/2023   PHYSICAL EXAM:  General:  Well appearing. No resp difficulty HEENT: normal Neck: supple. JVP flat. No lymphadenopathy or thryomegaly appreciated. Cor: PMI normal. Regular rate & rhythm. No rubs, gallops or murmurs. Lungs: clear Abdomen: soft, nontender, nondistended. No hepatosplenomegaly. No bruits or masses.  Extremities: no cyanosis, clubbing, rash, edema Neuro: alert & oriented x3, cranial nerves grossly intact. Moves all 4 extremities w/o difficulty. Affect pleasant.   ECG: not done   ASSESSMENT & PLAN:  1: Chronic heart failure with reduced ejection fraction- - NYHA class II - euvolemic - weighing daily; Reminded to call for an overnight weight gain of >2 pounds or a weekly weight gain of >5 pounds.  - weight up 11 pounds from last visit here 2 months ago - Echo 02/06/15: EF of 25% which is unchanged from previous echo on 12/10/14 which showed an EF of 25% and mild MR. - Echo 06/23/18: EF of 20-25% along with mild MR.  - Echo 03/01/2019: EF of 15% along with mild MR.  - Echo 02/27/20: EF of <20% - Echo 11/23/20: EF of <20% along with moderate LVH and moderately elevated PA pressure. - Echo 10/20/22: EF <20% along with mildly elevated PA pressure and mild MR.  - Stress test 03/01/2019:  Abnormal myocardial perfusion scan no clear evidence of reversible ischemia inferior persistent defect either scar versus body habitus artifact severely dilated left ventricular function severely depressed LV function with ejection fraction of around 11%.   - AICD (2016) in place but denies any shocks "in a long  time"; referral has been made by [facility] provider back to Dr. Maisie Fus - continue jardiance 10mg  daily - change entresto to 1/2 tablet of 24/26mg  BID instead of the whole tablet in the morning - continue carvedilol 3.125mg  BID - continue torsemide 40mg  daily - in the past, spironolactone has caused hypotension - saw HF provider Edwena Blow) 05/24 - BNP 06/12/23 was 464.6  2: HTN with CKD- - BP 93/80 - saw PCP [name]) at [facility] 03/24 - BMP 06/13/23 reviewed and showed sodium 135, potassium 3.8, Cr 1741 and GFR 48 - saw nephrology Suezanne Jacquet) 06/24  3: Obstructive sleep apnea- - wears nocturnal ventilator QHS, ~ 6-8 hours/night; reports sleeping well - continues wearing oxygen at 3L at bedtime & [name] during the day - saw pulmonology Jayme Cloud) 04/23/22  4: Diabetes- - A1c 06/13/23 was 7.3% - continue jardiance 10mg  daily  5: Tobacco use- - has not smoked cigarettes in the last month - is chewing 1 piece of nicotine gum every 2 hours; encouraged him to start weaning himself off of that as he feels able - complete cessation discussed for 3 minutes  6: Atrial fibrillation- - saw cardiology Juliann Pares) 10/24  7: VT- - magnesium 06/13/23 was 2.0 but on admission (06/12/23) it was 1.5 - continue amiodarone 400mg  daily - continue magnesium 400mg  daily  Return in 5 months, sooner if needed.

## 2023-06-23 ENCOUNTER — Encounter: Payer: Self-pay | Admitting: Family

## 2023-06-23 ENCOUNTER — Ambulatory Visit: Payer: 59 | Attending: Family | Admitting: Family

## 2023-06-23 VITALS — BP 93/80 | HR 70 | Resp 14 | Wt 300.4 lb

## 2023-06-23 DIAGNOSIS — I5022 Chronic systolic (congestive) heart failure: Secondary | ICD-10-CM

## 2023-06-23 DIAGNOSIS — Z716 Tobacco abuse counseling: Secondary | ICD-10-CM | POA: Diagnosis not present

## 2023-06-23 DIAGNOSIS — I480 Paroxysmal atrial fibrillation: Secondary | ICD-10-CM

## 2023-06-23 DIAGNOSIS — Z87891 Personal history of nicotine dependence: Secondary | ICD-10-CM | POA: Insufficient documentation

## 2023-06-23 DIAGNOSIS — Z79899 Other long term (current) drug therapy: Secondary | ICD-10-CM | POA: Insufficient documentation

## 2023-06-23 DIAGNOSIS — M109 Gout, unspecified: Secondary | ICD-10-CM | POA: Insufficient documentation

## 2023-06-23 DIAGNOSIS — N183 Chronic kidney disease, stage 3 unspecified: Secondary | ICD-10-CM | POA: Insufficient documentation

## 2023-06-23 DIAGNOSIS — I252 Old myocardial infarction: Secondary | ICD-10-CM | POA: Diagnosis not present

## 2023-06-23 DIAGNOSIS — I471 Supraventricular tachycardia, unspecified: Secondary | ICD-10-CM | POA: Insufficient documentation

## 2023-06-23 DIAGNOSIS — I10 Essential (primary) hypertension: Secondary | ICD-10-CM

## 2023-06-23 DIAGNOSIS — Z7984 Long term (current) use of oral hypoglycemic drugs: Secondary | ICD-10-CM | POA: Insufficient documentation

## 2023-06-23 DIAGNOSIS — Z720 Tobacco use: Secondary | ICD-10-CM

## 2023-06-23 DIAGNOSIS — E785 Hyperlipidemia, unspecified: Secondary | ICD-10-CM | POA: Insufficient documentation

## 2023-06-23 DIAGNOSIS — I472 Ventricular tachycardia, unspecified: Secondary | ICD-10-CM

## 2023-06-23 DIAGNOSIS — I130 Hypertensive heart and chronic kidney disease with heart failure and stage 1 through stage 4 chronic kidney disease, or unspecified chronic kidney disease: Secondary | ICD-10-CM | POA: Insufficient documentation

## 2023-06-23 DIAGNOSIS — R0602 Shortness of breath: Secondary | ICD-10-CM | POA: Diagnosis present

## 2023-06-23 DIAGNOSIS — I447 Left bundle-branch block, unspecified: Secondary | ICD-10-CM | POA: Diagnosis not present

## 2023-06-23 DIAGNOSIS — E1122 Type 2 diabetes mellitus with diabetic chronic kidney disease: Secondary | ICD-10-CM | POA: Insufficient documentation

## 2023-06-23 DIAGNOSIS — G4733 Obstructive sleep apnea (adult) (pediatric): Secondary | ICD-10-CM | POA: Diagnosis not present

## 2023-06-23 DIAGNOSIS — E119 Type 2 diabetes mellitus without complications: Secondary | ICD-10-CM

## 2023-06-23 DIAGNOSIS — Z95810 Presence of automatic (implantable) cardiac defibrillator: Secondary | ICD-10-CM | POA: Insufficient documentation

## 2023-06-23 DIAGNOSIS — Z794 Long term (current) use of insulin: Secondary | ICD-10-CM

## 2023-06-23 DIAGNOSIS — J4489 Other specified chronic obstructive pulmonary disease: Secondary | ICD-10-CM | POA: Diagnosis not present

## 2023-06-23 NOTE — Patient Instructions (Signed)
Change your entresto to 1/2 tablet in the morning and 1/2 tablet in the evening

## 2023-06-24 NOTE — Progress Notes (Signed)
Outpatient Surgical Follow Up  06/24/2023  Oscar [name] is an 47 y.o. male.   Chief Complaint  Patient presents with   Follow-up    HPI: 47 year old male well-known to me with multiple comorbidities including heart failure diabetes and morbid obesity now presents with peritoneal drainage.  He does have a history of hidradenitis.  He needs to have hidradenitis in the right groin and right scrotum..  No fevers no chills.  No recent imaging.  Not seen dermatology yet Past Medical History:  Diagnosis Date   AICD (automatic cardioverter/defibrillator) present    Aortic atherosclerosis (HCC)    Asthma    Chronic respiratory failure (HCC)    CKD (chronic kidney disease), stage III (HCC)    COPD (chronic obstructive pulmonary disease) (HCC)    Deafness in right ear    Diabetes mellitus without complication (HCC)    Dyspnea    GERD (gastroesophageal reflux disease)    Gout    HFrEF (heart failure with reduced ejection fraction) (HCC)    a.) TTE 12/10/14: EF 25%; diff inf HK; sev LV and mod LA dil; LVH. b.) TTE 06/23/18: EF 20-25%; LVH; mild LV dil, mild BAE. c.) TTE 03/01/19: EF 15%, LVH, BAE; triv PR/TR. d.) TTE 02/27/20: EF < 20%; sev LV dil; mild MR. e.) TTE 11/23/20: EF < 20%; glob HK; sev LV dil; LVH; mild-mod BAE; mod-sev TR, triv AR; G1DD. f.) TTE 05/14/21: EF <15%; LVH; sev LA and mild RV enlar; triv AR/PR, mild TR, mod MR.   Hiatal hernia    History of cardiac catheterization    a.) R/LHC 12/14/2009: normal coronaries. b.) R/LHC 12/11/2014: normal coronaries.   Hyperlipidemia    Hypertension    Hypoxemia    LBBB (left bundle branch block)    NICM (nonischemic cardiomyopathy; dilated cardiomyopathy) (HCC)    a.) R/LHC 12/14/2009: normal cors; LVEDP 18 mmHg, mean PA 29 mmHg, mean PCWP 31 mmHg; CO 6 L/min; CI 2.54 L/min/m. b.) TTE 12/10/2014: EF 25%. c.) R/LHC 12/11/2014: mean RA 9 mmHg, mean PA 22 mmHg, mean PCWP 22 mmHg. d.) TTE 06/23/2018: EF 20-25%. e.) TTE 03/01/2019: EF 15%. f.)  TTE 02/27/2020: < 20%. g.) TTE 11/23/2020: EF <20%. h.) TTE 05/14/2021: < 15%.   NSTEMI (non-ST elevated myocardial infarction) (HCC)    a.) x 3 per patient report ---> 2012, 2014, 2016   Obesity    On supplemental oxygen by nasal cannula    a.) 2-3 L/[place] PRN   OSA on CPAP    PAF (paroxysmal atrial fibrillation) (HCC)    a.) CHA2DS2-VASc = 4 (HFrEF, HTN, prior MI, T2DM). b.) rate/rhythm maintained without pharmacologial intervention; no current anticoagulation.   Pancreatitis    PSVT (paroxysmal supraventricular tachycardia) [facility])     Past Surgical History:  Procedure Laterality Date   BIOPSY  11/25/2021   Procedure: BIOPSY;  Surgeon: Meridee Score Netty Starring., MD;  Location: [facility] ENDOSCOPY;  Service: Gastroenterology;;   CARDIAC CATHETERIZATION  12/11/2014   Procedure: RIGHT/LEFT HEART CATH AND CORONARY ANGIOGRAPHY;  Surgeon: Runell Gess, MD;  Location: [facility] CATH LAB;  Service: Cardiovascular;;   COLONOSCOPY WITH PROPOFOL N/A 11/13/2021   Procedure: COLONOSCOPY WITH PROPOFOL;  Surgeon: Toney Reil, MD;  Location: [facility] ENDOSCOPY;  Service: Gastroenterology;  Laterality: N/A;   ESOPHAGOGASTRODUODENOSCOPY (EGD) WITH PROPOFOL N/A 11/25/2021   Procedure: ESOPHAGOGASTRODUODENOSCOPY (EGD) WITH PROPOFOL;  Surgeon: Meridee Score Netty Starring., MD;  Location: [facility] ENDOSCOPY;  Service: Gastroenterology;  Laterality: N/A;   ICD LEAD REMOVAL N/A 03/30/2015   Procedure:  ICD LEAD REMOVAL;  Surgeon: Sharion Settler, MD;  Location: [facility] ORS;  Service: Cardiovascular;  Laterality: N/A;   IMPLANTABLE CARDIOVERTER DEFIBRILLATOR IMPLANT     INCISION AND DRAINAGE ABSCESS N/A 12/12/2020   Procedure: INCISION AND DRAINAGE ABSCESS;  Surgeon: Leafy Ro, MD;  Location: [facility] ORS;  Service: General;  Laterality: N/A;   INSERT / REPLACE / REMOVE PACEMAKER     LEFT HEART CATHETERIZATION WITH CORONARY ANGIOGRAM N/A 12/09/2014   Procedure: LEFT HEART CATHETERIZATION WITH CORONARY ANGIOGRAM;  Surgeon: Kathleene Hazel, MD;  Location: [facility] CATH LAB;  Service: Cardiovascular;  Laterality: N/A;   RIGHT/LEFT HEART CATH AND CORONARY ANGIOGRAPHY Bilateral 12/14/2009   Procedure: RIGHT/LEFT HEART CATH AND CORONARY ANGIOGRAPHY; Location: [facility]; Surgeon: Rudean Hitt, MD   TONSILLECTOMY     as a child , adnoids removed   UPPER ESOPHAGEAL ENDOSCOPIC ULTRASOUND (EUS) N/A 11/25/2021   Procedure: UPPER ESOPHAGEAL ENDOSCOPIC ULTRASOUND (EUS);  Surgeon: Lemar Lofty., MD;  Location: [facility]OS[facility];  Service: Gastroenterology;  Laterality: N/A;    Family History  Problem Relation Age of Onset   Hypertension Mother    Congestive Heart Failure Mother    Hypertension Sister    Diabetes Sister    Pancreatitis Sister    COPD Sister    Emphysema Sister        smoked   Pancreatitis Brother    Anemia Neg Hx    Arrhythmia Neg Hx    Asthma Neg Hx    Clotting disorder Neg Hx    Fainting Neg Hx    Heart attack Neg Hx    Heart disease Neg Hx    Heart failure Neg Hx    Hyperlipidemia Neg Hx     Social History:  reports that he has quit smoking. His smoking use included cigarettes. He has a 51 pack-year smoking history. He has been exposed to tobacco smoke. He has never used smokeless tobacco. He reports that he does not drink alcohol and does not use drugs.  Allergies:  Allergies  Allergen Reactions   Ciprofloxacin Swelling and Other (See Comments)    Migraine Headache    Iodinated Contrast Media     Other reaction(s): Vomiting Projectile vomiting   Isosorb Dinitrate-Hydralazine Other (See Comments)    Migraine Headache    Medications reviewed.    ROS Full ROS performed and is otherwise negative other than what is stated in HPI   BP 95/70   Pulse 73   Temp 98 F (36.7 C)   Ht 5\' 5"  (1.651 m)   Wt 298 lb (135.2 kg)   SpO2 97%   BMI 49.59 kg/m   Physical Exam  Vitals reviewed.  Constitutional:      Appearance: Normal appearance. He is obese.  Abdominal:     General: There is  no distension.     Palpations: There is no mass.     Tenderness: There is no abdominal tenderness. There is no rebound.     Hernia: No hernia is present.  Genitourinary:    Comments: Perineal and right groin hidradenitis w/o abscess Musculoskeletal:        General: No swelling. Normal range of motion.  Skin:    General: Skin is warm and dry.     Capillary Refill: Capillary refill takes less than 2 seconds.  Neurological:     General: No focal deficit present.     Mental Status: He is alert and oriented to person, place, and time.  Psychiatric:  Mood and Affect: Mood normal.        Behavior: Behavior normal.        Thought Content: Thought content normal.        Judgment: Judgment normal.    Assessment/Plan: 47 year old male with BMI of 50 significant medical issues to include severe heart failure, DM now presents with chronic hidradenitis.  At this point I do not see evidence of a defined abscess.  Recommend antibiotics.  I have recommended the patient in multiple times to see dermatology as I do not think that there is much I can do for him.  We will again make appropriate arrangements.  Return to clinic as needed.  Sterling Big, MD [facility] General Surgeon

## 2023-06-25 ENCOUNTER — Encounter: Payer: Self-pay | Admitting: Adult Health

## 2023-06-25 ENCOUNTER — Ambulatory Visit (INDEPENDENT_AMBULATORY_CARE_PROVIDER_SITE_OTHER): Payer: 59 | Admitting: Adult Health

## 2023-06-25 VITALS — BP 118/70 | HR 80 | Temp 97.7°F | Ht 65.0 in | Wt 298.6 lb

## 2023-06-25 DIAGNOSIS — J9612 Chronic respiratory failure with hypercapnia: Secondary | ICD-10-CM

## 2023-06-25 DIAGNOSIS — J449 Chronic obstructive pulmonary disease, unspecified: Secondary | ICD-10-CM | POA: Diagnosis not present

## 2023-06-25 DIAGNOSIS — G4733 Obstructive sleep apnea (adult) (pediatric): Secondary | ICD-10-CM | POA: Diagnosis not present

## 2023-06-25 NOTE — Assessment & Plan Note (Signed)
Presumed COPD with heavy smoking history and bullous disease on CT scan.  Check PFTs.  Continue on Anoro. Check chest x-ray today  Plan  Patient Instructions  Set up for PFTs  Chest xray today.  Trilogy NIV download.  Continue on Trilogy NIV At bedtime  and with naps -with Oxygen 3l/m  Continue on Anoro 1 puff daily  Albuterol inhaler As needed   Continue on Oxygen with activity to keep O2 sats >88-90%.  Follow up with Dr. Jayme Cloud in 4 months and As needed

## 2023-06-25 NOTE — Assessment & Plan Note (Signed)
Chronic hypercarbic and hypoxic respiratory failure.  Continue on oxygen to maintain O2 saturations greater than 88 to 90%.  Continue on oxygen with trilogy vent at bedtime.

## 2023-06-25 NOTE — Progress Notes (Signed)
@Patient  ID: Oscar [name], male    DOB: 01/14/1976, 47 y.o.   MRN: [id number]  Chief Complaint  Patient presents with   Follow-up    Referring provider: Preston Fleeting*  HPI: 47 year old male former smoker seen for pulmonary consult April 23, 2022 for COPD, chronic respiratory failure on oxygen and obstructive sleep apnea Medical history significant for chronic systolic heart failure (EF less than 20%), nonischemic cardiomyopathy   TEST/EVENTS :  PFTs August 2023 pending  CT chest angio November 23, 2020 negative for aneurysm, scattered areas of bullous disease right greater than left, pacemaker  2D echo October 20, 2022 EF less than 20%, severely decreased function of the left ventricle.  Global hypokinesis of the LV.  Mildly elevated pulmonary artery systolic pressure.  RV SF normal.  RV size is normal.  06/25/2023 Follow up : COPD, O2 RF , OSA  Patient returns for a follow-up visit.  Last seen August 2023.  Patient has presumed COPD as he has a history of heavy smoking.  Previous CT chest has shown scattered bullous disease.  Patient has not completed recommended pulmonary function testing.  He remains on Anoro daily.  Endorses compliance.  Overall says he gets short of breath with activities. Denies any flare of cough or wheezing. Gets winded with prolonged walking. Sedentary . Disabled .  Has aide that helps him at home 2 hr each day. Uses albuterol 3-4 times a week.    Patient has underlying severe obstructive sleep apnea with previous sleep study in 2017 showing AHI at 36/hour titration portion of the study showed improved control on BiPAP support with IPAP 26 and EPAP minimum 20 (residual AHI was/hour 16.8 on BiPAP). Patient says he is on Trilogy vent . We have requested a download. Wears each night . Can not sleep without it. Feels he benefits from use with NIV with decreased daytime sleepiness. Usually gets 6hr of sleep each night .   Is on chronic oxygen 3l/m  with activity and At bedtime  with NIV.  Not on oxygen today in office , O2 sats 98% on room air .    Recent hospitalization earlier this month for V. Tach.  Patient is followed by cardiology for chronic systolic congestive heart failure, nonischemic cardiomyopathy.  Patient has an ICD.  Date of admission patient had significant shortness of breath.  His ICD went off twice.  Patient was seen by cardiology and underwent successful cardioversion to sinus rhythm after 3 shocks.  He was treated with amiodarone drip and then transitioned over to oral amiodarone.  He remains on Jardiance, torsemide and Entresto.  Leg swelling is stable.   Flu shot is up to date .     Allergies  Allergen Reactions   Ciprofloxacin Swelling and Other (See Comments)    Migraine Headache    Iodinated Contrast Media     Other reaction(s): Vomiting Projectile vomiting   Isosorb Dinitrate-Hydralazine Other (See Comments)    Migraine Headache    Immunization History  Administered Date(s) Administered   Influenza-Unspecified 03/02/2023   Moderna Sars-Covid-2 Vaccination 12/31/2019, 02/04/2020    Past Medical History:  Diagnosis Date   AICD (automatic cardioverter/defibrillator) present    Aortic atherosclerosis (HCC)    Asthma    Chronic respiratory failure (HCC)    CKD (chronic kidney disease), stage III (HCC)    COPD (chronic obstructive pulmonary disease) (HCC)    Deafness in right ear    Diabetes mellitus without complication (HCC)    Dyspnea  GERD (gastroesophageal reflux disease)    Gout    HFrEF (heart failure with reduced ejection fraction) (HCC)    a.) TTE 12/10/14: EF 25%; diff inf HK; sev LV and mod LA dil; LVH. b.) TTE 06/23/18: EF 20-25%; LVH; mild LV dil, mild BAE. c.) TTE 03/01/19: EF 15%, LVH, BAE; triv PR/TR. d.) TTE 02/27/20: EF < 20%; sev LV dil; mild MR. e.) TTE 11/23/20: EF < 20%; glob HK; sev LV dil; LVH; mild-mod BAE; mod-sev TR, triv AR; G1DD. f.) TTE 05/14/21: EF <15%; LVH; sev LA and  mild RV enlar; triv AR/PR, mild TR, mod MR.   Hiatal hernia    History of cardiac catheterization    a.) R/LHC 12/14/2009: normal coronaries. b.) R/LHC 12/11/2014: normal coronaries.   Hyperlipidemia    Hypertension    Hypoxemia    LBBB (left bundle branch block)    NICM (nonischemic cardiomyopathy; dilated cardiomyopathy) (HCC)    a.) R/LHC 12/14/2009: normal cors; LVEDP 18 mmHg, mean PA 29 mmHg, mean PCWP 31 mmHg; CO 6 L/min; CI 2.54 L/min/m. b.) TTE 12/10/2014: EF 25%. c.) R/LHC 12/11/2014: mean RA 9 mmHg, mean PA 22 mmHg, mean PCWP 22 mmHg. d.) TTE 06/23/2018: EF 20-25%. e.) TTE 03/01/2019: EF 15%. f.) TTE 02/27/2020: < 20%. g.) TTE 11/23/2020: EF <20%. h.) TTE 05/14/2021: < 15%.   NSTEMI (non-ST elevated myocardial infarction) (HCC)    a.) x 3 per patient report ---> 2012, 2014, 2016   Obesity    On supplemental oxygen by nasal cannula    a.) 2-3 L/[place] PRN   OSA on CPAP    PAF (paroxysmal atrial fibrillation) (HCC)    a.) CHA2DS2-VASc = 4 (HFrEF, HTN, prior MI, T2DM). b.) rate/rhythm maintained without pharmacologial intervention; no current anticoagulation.   Pancreatitis    PSVT (paroxysmal supraventricular tachycardia) (HCC)     Tobacco History: Social History   Tobacco Use  Smoking Status Former   Current packs/day: 0.00   Average packs/day: 1.5 packs/day for 34.0 years (51.0 ttl pk-yrs)   Types: Cigarettes   Quit date: 05/2023   Years since quitting: 0.1   Passive exposure: Past  Smokeless Tobacco Never  Tobacco Comments   Quit smoking 05/2023   Counseling given: Not Answered Tobacco comments: Quit smoking 05/2023   Outpatient Medications Prior to Visit  Medication Sig Dispense Refill   amiodarone (PACERONE) 400 MG tablet Take 400 mg by mouth daily.     atorvastatin (LIPITOR) 40 MG tablet Take 40 mg by mouth daily.     carvedilol (COREG) 3.125 MG tablet Take 1 tablet (3.125 mg total) by mouth 2 (two) times daily with a meal. 60 tablet 0   clindamycin (CLEOCIN-T)  1 % lotion Apply to affected areas daily after shower. 60 mL 3   colchicine 0.6 MG tablet Take 0.6 mg by mouth as needed (gout).     ENTRESTO 24-26 MG Take 0.5 tablets by mouth 2 (two) times daily.     JARDIANCE 10 MG TABS tablet Take 10 mg by mouth daily.     ketoconazole (NIZORAL) 2 % cream Apply twice daily to neck until clear 60 g 2   magnesium oxide (MAG-OX) 400 (240 Mg) MG tablet Take 1 tablet (400 mg total) by mouth daily. 30 tablet 0   mupirocin ointment (BACTROBAN) 2 % Apply topically 3 (three) times daily.     NON FORMULARY Pt uses a cpap nightly     pantoprazole (PROTONIX) 40 MG tablet Take 40 mg by mouth daily.  sulfamethoxazole-trimethoprim (BACTRIM DS) 800-160 MG tablet Take 1 tablet by mouth 2 (two) times daily for 10 days. 20 tablet 0   torsemide (DEMADEX) 20 MG tablet Take 40 mg by mouth daily.     umeclidinium-vilanterol (ANORO ELLIPTA) 62.5-25 MCG/ACT AEPB Inhale 1 puff into the lungs daily. 60 each 6   No facility-administered medications prior to visit.     Review of Systems:   Constitutional:   No  weight loss, night sweats,  Fevers, chills,  +fatigue, or  lassitude.  HEENT:   No headaches,  Difficulty swallowing,  Tooth/dental problems, or  Sore throat,                No sneezing, itching, ear ache, nasal congestion, post nasal drip,   CV:  No chest pain,  Orthopnea, PND,  anasarca, dizziness, palpitations, syncope.   GI  No heartburn, indigestion, abdominal pain, nausea, vomiting, diarrhea, change in bowel habits, loss of appetite, bloody stools.   Resp: No chest wall deformity  Skin: no rash or lesions.  GU: no dysuria, change in color of urine, no urgency or frequency.  No flank pain, no hematuria   MS:  No joint pain or swelling.  No decreased range of motion.  No back pain.    Physical Exam  BP 118/70 (BP Location: Right Arm, Cuff Size: Large)   Pulse 80   Temp 97.7 F (36.5 C)   Ht 5\' 5"  (1.651 m)   Wt 298 lb 9.6 oz (135.4 kg)   SpO2 98%    BMI 49.69 kg/m   GEN: A/Ox3; pleasant , NAD, well nourished    HEENT:  [place]/AT,  NOSE-clear, THROAT-clear, no lesions, no postnasal drip or exudate noted.  Poor dentition   NECK:  Supple w/ fair ROM; no JVD; normal carotid impulses w/o bruits; no thyromegaly or nodules palpated; no lymphadenopathy.    RESP  Clear  P & A; w/o, wheezes/ rales/ or rhonchi. no accessory muscle use, no dullness to percussion  CARD:  RRR, no m/r/g, tr peripheral edema, pulses intact, no cyanosis or clubbing.  GI:   Soft & nt; nml bowel sounds; no organomegaly or masses detected.   Musco: Warm bil, no deformities or joint swelling noted.   Neuro: alert, no focal deficits noted.    Skin: Warm, no lesions or rashes    Lab Results:  CBC   BMET   BNP   ProBNP No results found for: "PROBNP"  Imaging: DG Chest Portable 1 View  Result Date: 06/12/2023 CLINICAL DATA:  Chest pain EXAM: PORTABLE CHEST 1 VIEW COMPARISON:  10/03/2021 FINDINGS: Cardiomegaly with central vascular congestion and probable mild interstitial edema. No pleural effusion or pneumothorax. Left-sided pacing device as before. IMPRESSION: Cardiomegaly with central vascular congestion and probable mild interstitial edema. Electronically Signed   By: Jasmine Pang M.D.   On: 06/12/2023 15:26    Administration History     None           No data to display          No results found for: "NITRICOXIDE"      Assessment & Plan:   COPD (chronic obstructive pulmonary disease) (HCC) Presumed COPD with heavy smoking history and bullous disease on CT scan.  Check PFTs.  Continue on Anoro. Check chest x-ray today  Plan  Patient Instructions  Set up for PFTs  Chest xray today.  Trilogy NIV download.  Continue on Trilogy NIV At bedtime  and with naps -with Oxygen 3l/m  Continue on Anoro 1 puff daily  Albuterol inhaler As needed   Continue on Oxygen with activity to keep O2 sats >88-90%.  Follow up with Dr. Jayme Cloud in 4  months and As needed         Obstructive sleep apnea Severe obstructive sleep apnea with suspected OHS component, also has a component of COPD.  Patient has been maintained chronically on noninvasive vent/trilogy.  Download has been requested.  Plan  Patient Instructions  Set up for PFTs  Chest xray today.  Trilogy NIV download.  Continue on Trilogy NIV At bedtime  and with naps -with Oxygen 3l/m  Continue on Anoro 1 puff daily  Albuterol inhaler As needed   Continue on Oxygen with activity to keep O2 sats >88-90%.  Follow up with Dr. Jayme Cloud in 4 months and As needed         Chronic respiratory failure with hypercapnia (HCC) Chronic hypercarbic and hypoxic respiratory failure.  Continue on oxygen to maintain O2 saturations greater than 88 to 90%.  Continue on oxygen with trilogy vent at bedtime.     Rubye Oaks, NP 06/25/2023

## 2023-06-25 NOTE — Assessment & Plan Note (Addendum)
Severe obstructive sleep apnea with suspected OHS component, also has a component of COPD/Hypercarbic/Hypoxic Respiratory Failure.  Patient has been maintained chronically on noninvasive vent/trilogy.  Download has been requested. Addend:  Trilogy download obtained with excellent compliance with 100% usage.  Daily average usage at 12 hours.  Plan  Patient Instructions  Set up for PFTs  Chest xray today.  Trilogy NIV download.  Continue on Trilogy NIV At bedtime  and with naps -with Oxygen 3l/m  Continue on Anoro 1 puff daily  Albuterol inhaler As needed   Continue on Oxygen with activity to keep O2 sats >88-90%.  Follow up with Dr. Jayme Cloud in 4 months and As needed

## 2023-06-25 NOTE — Patient Instructions (Addendum)
Set up for PFTs  Chest xray today.  Trilogy NIV download.  Continue on Trilogy NIV At bedtime  and with naps -with Oxygen 3l/m  Continue on Anoro 1 puff daily  Albuterol inhaler As needed   Continue on Oxygen with activity to keep O2 sats >88-90%.  Follow up with Dr. Jayme Cloud in 4 months and As needed

## 2023-06-29 ENCOUNTER — Other Ambulatory Visit: Payer: Self-pay | Admitting: Pulmonary Disease

## 2023-07-02 ENCOUNTER — Other Ambulatory Visit: Payer: Self-pay

## 2023-07-02 ENCOUNTER — Emergency Department: Payer: 59

## 2023-07-02 ENCOUNTER — Emergency Department
Admission: EM | Admit: 2023-07-02 | Discharge: 2023-07-02 | Disposition: A | Discharge status: Home / Self Care | Payer: 59 | Attending: Emergency Medicine | Admitting: Emergency Medicine

## 2023-07-02 ENCOUNTER — Encounter: Payer: Self-pay | Admitting: Medical Oncology

## 2023-07-02 DIAGNOSIS — L0231 Cutaneous abscess of buttock: Secondary | ICD-10-CM | POA: Diagnosis not present

## 2023-07-02 DIAGNOSIS — K219 Gastro-esophageal reflux disease without esophagitis: Secondary | ICD-10-CM | POA: Insufficient documentation

## 2023-07-02 DIAGNOSIS — N189 Chronic kidney disease, unspecified: Secondary | ICD-10-CM | POA: Insufficient documentation

## 2023-07-02 DIAGNOSIS — N179 Acute kidney failure, unspecified: Secondary | ICD-10-CM

## 2023-07-02 DIAGNOSIS — L03317 Cellulitis of buttock: Principal | ICD-10-CM | POA: Diagnosis present

## 2023-07-02 DIAGNOSIS — E1122 Type 2 diabetes mellitus with diabetic chronic kidney disease: Secondary | ICD-10-CM | POA: Insufficient documentation

## 2023-07-02 DIAGNOSIS — R42 Dizziness and giddiness: Secondary | ICD-10-CM | POA: Diagnosis present

## 2023-07-02 LAB — CBC
HCT: 39.9 % (ref 39.0–52.0)
Hemoglobin: 14 g/dL (ref 13.0–17.0)
MCH: 32.5 pg (ref 26.0–34.0)
MCHC: 35.1 g/dL (ref 30.0–36.0)
MCV: 92.6 fL (ref 80.0–100.0)
Platelets: 181 10*3/uL (ref 150–400)
RBC: 4.31 MIL/uL (ref 4.22–5.81)
RDW: 12.8 % (ref 11.5–15.5)
WBC: 5 10*3/uL (ref 4.0–10.5)
nRBC: 0 % (ref 0.0–0.2)

## 2023-07-02 LAB — BASIC METABOLIC PANEL
Anion gap: 9 (ref 5–15)
BUN: 71 mg/dL — ABNORMAL HIGH (ref 6–20)
CO2: 23 mmol/L (ref 22–32)
Calcium: 8.9 mg/dL (ref 8.9–10.3)
Chloride: 102 mmol/L (ref 98–111)
Creatinine, Ser: 2.93 mg/dL — ABNORMAL HIGH (ref 0.61–1.24)
GFR, Estimated: 26 mL/min — ABNORMAL LOW (ref >60)
Glucose, Bld: 145 mg/dL — ABNORMAL HIGH (ref 70–99)
Potassium: 4.3 mmol/L (ref 3.5–5.1)
Sodium: 134 mmol/L — ABNORMAL LOW (ref 135–145)

## 2023-07-02 MED ORDER — ACETAMINOPHEN 650 MG RE SUPP: 650.0000 mg | Freq: Four times a day (QID) | RECTAL | Status: DC | PRN

## 2023-07-02 MED ORDER — SODIUM CHLORIDE 0.9 % IV SOLN: 2.0000 g | Freq: Two times a day (BID) | INTRAVENOUS | Status: DC

## 2023-07-02 MED ORDER — ONDANSETRON HCL 4 MG PO TABS: 4.0000 mg | ORAL_TABLET | Freq: Four times a day (QID) | ORAL | Status: DC | PRN

## 2023-07-02 MED ORDER — VANCOMYCIN HCL IN DEXTROSE 1-5 GM/200ML-% IV SOLN
1000.0000 mg | Freq: Once | INTRAVENOUS | Status: DC
  Filled 2023-07-02: qty 200

## 2023-07-02 MED ORDER — ACETAMINOPHEN 325 MG PO TABS: 650.0000 mg | ORAL_TABLET | Freq: Four times a day (QID) | ORAL | Status: DC | PRN

## 2023-07-02 MED ORDER — HEPARIN SODIUM (PORCINE) 5000 UNIT/ML IJ SOLN: 5000.0000 [IU] | Freq: Three times a day (TID) | INTRAMUSCULAR | Status: DC

## 2023-07-02 MED ORDER — CLINDAMYCIN HCL 300 MG PO CAPS: 300.0000 mg | ORAL_CAPSULE | Freq: Three times a day (TID) | ORAL | 0 refills | Status: AC

## 2023-07-02 MED ORDER — VANCOMYCIN VARIABLE DOSE PER UNSTABLE RENAL FUNCTION (PHARMACIST DOSING): Status: DC

## 2023-07-02 MED ORDER — VANCOMYCIN HCL IN DEXTROSE 1-5 GM/200ML-% IV SOLN
1000.0000 mg | Freq: Once | INTRAVENOUS | Status: AC
  Administered 2023-07-02: 1000 mg via INTRAVENOUS
  Filled 2023-07-02: qty 200

## 2023-07-02 MED ORDER — SENNOSIDES-DOCUSATE SODIUM 8.6-50 MG PO TABS: 1.0000 | ORAL_TABLET | Freq: Every evening | ORAL | Status: DC | PRN

## 2023-07-02 MED ORDER — SODIUM CHLORIDE 0.9 % IV BOLUS
1000.0000 mL | Freq: Once | INTRAVENOUS | Status: AC
  Administered 2023-07-02: 1000 mL via INTRAVENOUS

## 2023-07-02 MED ORDER — VANCOMYCIN HCL IN DEXTROSE 1-5 GM/200ML-% IV SOLN
1000.0000 mg | Freq: Once | INTRAVENOUS | Status: AC
  Administered 2023-07-02: 1000 mg via INTRAVENOUS

## 2023-07-02 MED ORDER — ONDANSETRON HCL 4 MG/2ML IJ SOLN: 4.0000 mg | Freq: Four times a day (QID) | INTRAMUSCULAR | Status: DC | PRN

## 2023-07-02 NOTE — Discharge Instructions (Addendum)
Please call the number provided for general surgery to arrange a follow-up appointment.  Please take antibiotics as prescribed for their entire course.  Return to the emergency department for any symptom personally concerning to yourself.  Please follow-up with your nephrologist/kidney doctor soon as possible.

## 2023-07-02 NOTE — ED Triage Notes (Signed)
Pt reports that he has been having issues with an abscess to groin and has been having dizziness with fatigue since yesterday. Pt reports room spinning sensation,

## 2023-07-02 NOTE — ED Provider Notes (Addendum)
[facility] Provider Note    Event Date/Time   First MD Initiated Contact with Patient 07/02/23 [phone]     (approximate)  History   Chief Complaint: Dizziness, Fatigue, and Abscess  HPI  Oscar [name] is a 47 y.o. male with a past medical history of CKD, COPD, diabetes, CHF, presents to the emergency department for an abscess to his groin.  According to the patient her last few days he has been feeling dizzy at times, states for the last 3 weeks he has been dealing with an abscess/cyst to his groin.  Patient states he followed up with general surgery (Dr.Pabon) who referred the patient to dermatology.  Patient states he saw the dermatologist and he was told that this would likely need to be surgically incised drained and washed out and sent the patient to the emergency department.  Patient denies any fever.  Does state moderate pain to this area.  Patient states dizziness/lightheadedness since last night although denies any currently.  Denies any nausea vomiting or diarrhea.  Physical Exam   Triage Vital Signs: ED Triage Vitals  Encounter Vitals Group     BP 07/02/23 0759 107/76     Systolic BP Percentile --      Diastolic BP Percentile --      Pulse Rate 07/02/23 0759 67     Resp 07/02/23 0759 18     Temp 07/02/23 0759 (!) 97.5 F (36.4 C)     Temp Source 07/02/23 0759 Oral     SpO2 07/02/23 0759 99 %     Weight 07/02/23 0800 297 lb 9.9 oz (135 kg)     Height 07/02/23 0800 5\' 5"  (1.651 m)     Head Circumference --      Peak Flow --      Pain Score 07/02/23 0759 2     Pain Loc --      Pain Education --      Exclude from Growth Chart --     Most recent vital signs: Vitals:   07/02/23 0759  BP: 107/76  Pulse: 67  Resp: 18  Temp: (!) 97.5 F (36.4 C)  SpO2: 99%    General: Awake, no distress.  CV:  Good peripheral perfusion.  Regular rate and rhythm  Resp:  Normal effort.  Equal breath sounds bilaterally.  Abd:  No distention.  Soft,  nontender.  No rebound or guarding. Other:  Patient has indurated area to the base of the scrotum extending into the perineum concerning for possible extending abscess.   ED Results / Procedures / Treatments   EKG  EKG viewed and interpreted by myself shows a normal sinus rhythm at 66 bpm with a widened QRS, normal axis, slight QTc prolongation otherwise normal intervals, left bundle branch block without concerning ST changes.  RADIOLOGY  I have reviewed and interpreted CT images.  Patient has areas of stranding in the perennial area. Radiology has read thickened area from the right inguinal region to the right gluteal cleft but no fluid collection or gas.   MEDICATIONS ORDERED IN ED: Medications  sodium chloride 0.9 % bolus 1,000 mL (has no administration in time range)     IMPRESSION / MDM / ASSESSMENT AND PLAN / ED COURSE  I reviewed the triage vital signs and the nursing notes.  Patient's presentation is most consistent with acute presentation with potential threat to life or bodily function.  Patient presents the emergency department for an abscess to his groin/perineum along  with dizziness sensation.  Patient's lab work today does show worsening of the patient's chronic kidney disease.  We will IV hydrate.  Patient's white blood cell count is normal with a reassuring CBC.  However given the indurated area to the groin we will obtain CT imaging and discussed with general surgery for further recommendations.  Patient's labs show reassuring CBC with a normal white blood cell count however chemistry does show worsening renal function with a creatinine of 2.9 from a baseline closer to 1.5.  Patient CT scan shows continued thickening of the skin possible stranding but no fluid collection.  I spoke to general surgery who will be seeing the patient in consultation.  Given the patient's worsening kidney function we will start the patient on IV antibiotics we will admit to the hospital  service.  Patient initially agreeable to admission now states he needs to leave and does not want to be admitted to the hospital.  Patient understands that his kidney function has gone down significantly, he states he is already called his kidney doctor and plans to follow-up with them this week.  We will start the patient on oral antibiotics and have the patient follow-up with general surgery in the office.  Patient understands the risks and going home but still wishes to go home.  FINAL CLINICAL IMPRESSION(S) / ED DIAGNOSES   Cellulitis Acute on chronic renal disease   Note:  This document was prepared using Dragon voice recognition software and may include unintentional dictation errors.   Minna Antis, MD 07/02/23 1131    Minna Antis, MD 07/02/23 1257

## 2023-07-02 NOTE — Progress Notes (Signed)
Pharmacy Antibiotic Note  Oscar [name] is a 47 y.o. male w/ PMH of CKD, COPD, diabetes, CHF admitted on 07/02/2023 with an abscess to his groin.  Pharmacy has been consulted for vancomycin, cefepime dosing. Serum creatinine is elevated well above apparent baseline  Plan:  1) start cefepime 2 grams IV every 12 hours ---follow renal function for needed dose adjustments  2) vancomycin 2000 mg IV x 1  ---vancomycin variable dose marker placed ---random vancomycin level in am to assess clearance ---BMP in am   Height: 5\' 5"  (165.1 cm) Weight: 135 kg (297 lb 9.9 oz) IBW/kg (Calculated) : 61.5  Temp (24hrs), Avg:97.5 F (36.4 C), Min:97.5 F (36.4 C), Max:97.5 F (36.4 C)  Recent Labs  Lab 07/02/23 0801  WBC 5.0  CREATININE 2.93*    Estimated Creatinine Clearance: 39.6 mL/min (A) (by C-G formula based on SCr of 2.93 mg/dL (H)).    Allergies  Allergen Reactions   Ciprofloxacin Swelling and Other (See Comments)    Migraine Headache    Iodinated Contrast Media     Other reaction(s): Vomiting Projectile vomiting   Isosorb Dinitrate-Hydralazine Other (See Comments)    Migraine Headache    Antimicrobials this admission: 10/31 vancomycin >>  10/31 cefepime >>   Microbiology results: 10/31 UCx: pending   Thank you for allowing pharmacy to be a part of this patient's care.  Lowella Bandy 07/02/2023 1:04 PM

## 2023-08-03 ENCOUNTER — Encounter: Payer: Self-pay | Admitting: Emergency Medicine

## 2023-08-03 ENCOUNTER — Emergency Department: Payer: 59

## 2023-08-03 ENCOUNTER — Emergency Department
Admission: EM | Admit: 2023-08-03 | Discharge: 2023-08-03 | Disposition: A | Discharge status: Home / Self Care | Payer: 59 | Attending: Emergency Medicine | Admitting: Emergency Medicine

## 2023-08-03 ENCOUNTER — Other Ambulatory Visit: Payer: Self-pay

## 2023-08-03 DIAGNOSIS — L732 Hidradenitis suppurativa: Secondary | ICD-10-CM | POA: Insufficient documentation

## 2023-08-03 DIAGNOSIS — Z95810 Presence of automatic (implantable) cardiac defibrillator: Secondary | ICD-10-CM | POA: Diagnosis not present

## 2023-08-03 DIAGNOSIS — Z79899 Other long term (current) drug therapy: Secondary | ICD-10-CM | POA: Insufficient documentation

## 2023-08-03 DIAGNOSIS — R1031 Right lower quadrant pain: Secondary | ICD-10-CM | POA: Diagnosis present

## 2023-08-03 LAB — CBC WITH DIFFERENTIAL/PLATELET
Abs Immature Granulocytes: 0.02 10*3/uL (ref 0.00–0.07)
Basophils Absolute: 0 10*3/uL (ref 0.0–0.1)
Basophils Relative: 0 %
Eosinophils Absolute: 0.1 10*3/uL (ref 0.0–0.5)
Eosinophils Relative: 3 %
HCT: 41.3 % (ref 39.0–52.0)
Hemoglobin: 14.2 g/dL (ref 13.0–17.0)
Immature Granulocytes: 0 %
Lymphocytes Relative: 33 %
Lymphs Abs: 1.5 10*3/uL (ref 0.7–4.0)
MCH: 32.3 pg (ref 26.0–34.0)
MCHC: 34.4 g/dL (ref 30.0–36.0)
MCV: 93.9 fL (ref 80.0–100.0)
Monocytes Absolute: 0.5 10*3/uL (ref 0.1–1.0)
Monocytes Relative: 10 %
Neutro Abs: 2.4 10*3/uL (ref 1.7–7.7)
Neutrophils Relative %: 54 %
Platelets: 152 10*3/uL (ref 150–400)
RBC: 4.4 MIL/uL (ref 4.22–5.81)
RDW: 12.5 % (ref 11.5–15.5)
WBC: 4.5 10*3/uL (ref 4.0–10.5)
nRBC: 0 % (ref 0.0–0.2)

## 2023-08-03 LAB — URINALYSIS, ROUTINE W REFLEX MICROSCOPIC
Bilirubin Urine: NEGATIVE
Glucose, UA: NEGATIVE mg/dL
Ketones, ur: NEGATIVE mg/dL
Leukocytes,Ua: NEGATIVE
Nitrite: NEGATIVE
Protein, ur: 30 mg/dL — AB
Specific Gravity, Urine: 1.014 (ref 1.005–1.030)
pH: 5 (ref 5.0–8.0)

## 2023-08-03 LAB — BASIC METABOLIC PANEL
Anion gap: 9 (ref 5–15)
BUN: 36 mg/dL — ABNORMAL HIGH (ref 6–20)
CO2: 25 mmol/L (ref 22–32)
Calcium: 8.8 mg/dL — ABNORMAL LOW (ref 8.9–10.3)
Chloride: 100 mmol/L (ref 98–111)
Creatinine, Ser: 1.71 mg/dL — ABNORMAL HIGH (ref 0.61–1.24)
GFR, Estimated: 49 mL/min — ABNORMAL LOW (ref >60)
Glucose, Bld: 185 mg/dL — ABNORMAL HIGH (ref 70–99)
Potassium: 4 mmol/L (ref 3.5–5.1)
Sodium: 134 mmol/L — ABNORMAL LOW (ref 135–145)

## 2023-08-03 MED ORDER — CLINDAMYCIN HCL 150 MG PO CAPS
450.0000 mg | ORAL_CAPSULE | Freq: Three times a day (TID) | ORAL | 0 refills | Status: AC
Start: 2023-08-03 — End: 2023-08-13

## 2023-08-03 MED ORDER — OXYCODONE HCL 5 MG PO TABS: 5.0000 mg | ORAL_TABLET | Freq: Four times a day (QID) | ORAL | 0 refills | Status: AC | PRN

## 2023-08-03 MED ORDER — OXYCODONE HCL 5 MG PO TABS
5.0000 mg | ORAL_TABLET | Freq: Once | ORAL | Status: AC
  Administered 2023-08-03: 5 mg via ORAL
  Filled 2023-08-03: qty 1

## 2023-08-03 NOTE — ED Provider Notes (Signed)
[facility] Provider Note    Event Date/Time   First MD Initiated Contact with Patient 08/03/23 [phone]     (approximate)   History   Cyst   HPI  Oscar [name] is a 47 y.o. male with history of V. 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  Patient reports right groin swelling and pain.  He reports having a little bit of drainage coming out near the bottom.  He reports that he was seen by surgery placed on Bactrim he then followed up in the emergency room 10 days later and recommended admission.  I reviewed this note where he declined admission and he went home on clindamycin.  He states that he is followed up with dermatology and they stated that they cannot help him.  It looks like Dr. Hurman Horn note mentions this could be from hidantitius on 10/21/patient does report that he is going to see a urologist next week.  He reports that the symptoms have been pretty stable since when he was last seen but is just not improving even with the antibiotics.  He reports still continued pain over the area.   Physical Exam   Triage Vital Signs: ED Triage Vitals  Encounter Vitals Group     BP 08/03/23 0814 (!) 123/96     Systolic BP Percentile --      Diastolic BP Percentile --      Pulse Rate 08/03/23 0814 80     Resp 08/03/23 0814 17     Temp 08/03/23 0814 97.6 F (36.4 C)     Temp Source 08/03/23 0814 Oral     SpO2 08/03/23 0814 94 %     Weight 08/03/23 0830 297 lb 9.9 oz (135 kg)     Height 08/03/23 0830 5\' 5"  (1.651 m)     Head Circumference --      Peak Flow --      Pain Score 08/03/23 0815 3     Pain Loc --      Pain Education --      Exclude from Growth Chart --     Most recent vital signs: Vitals:   08/03/23 0814  BP: (!) 123/96  Pulse: 80  Resp: 17  Temp: 97.6 F (36.4 C)  SpO2: 94%     General: Awake, no distress.  CV:  Good peripheral perfusion.  Resp:  Normal effort.  Abd:  No distention.  Soft  nontender Other:  Chaperone was present I did an exam.  He is got some induration noted of the right groin area into the right testicle slightly.  No obvious fluctuation felt.  No obvious skin color changes.  No discharge from the urethra.   ED Results / Procedures / Treatments   Labs (all labs ordered are listed, but only abnormal results are displayed) Labs Reviewed  BASIC METABOLIC PANEL - Abnormal; Notable for the following components:      Result Value   Sodium 134 (*)    Glucose, Bld 185 (*)    BUN 36 (*)    Creatinine, Ser 1.71 (*)    Calcium 8.8 (*)    GFR, Estimated 49 (*)    All other components within normal limits  CHLAMYDIA/NGC RT PCR ([facility] ONLY)            CBC WITH DIFFERENTIAL/PLATELET  URINALYSIS, ROUTINE W REFLEX MICROSCOPIC      RADIOLOGY I have reviewed the CT personally interpreted and  no evidence of any infection. PROCEDURES:  Critical Care performed: No  Procedures   MEDICATIONS ORDERED IN ED: Medications  oxyCODONE (Oxy IR/ROXICODONE) immediate release tablet 5 mg (5 mg Oral Given 08/03/23 0843)     IMPRESSION / MDM / ASSESSMENT AND PLAN / ED COURSE  I reviewed the triage vital signs and the nursing notes.   Patient's presentation is most consistent with acute presentation with potential threat to life or bodily function.   Patient comes in with some right groin swelling.  Sounds like a chronic issue but will get CT imaging without contrast due to his contrast allergy just to ensure that there is no worsening of any abscess.  Solik this is been more consistent with chronic hidinnitus seen by surgery outpatient.  Will get an ultrasound given CT scan was otherwise reassuring just to ensure no drainable abscess, torsion.  Will get urine to evaluate for any evidence of UTI, gonorrhea, chlamydia.  We can trial a higher dose of clindamycin and have patient follow-up with surgery outpatient.  IMPRESSION: *Along the lateral aspect of right testicle,  there is a collection which exhibits increased through transmission. No discrete wall. There are diffuse homogeneous low-level echoes within. No abnormal vascularity within. This may represent a small hematoma/complex collection. Correlate clinically. *Otherwise, unremarkable exam.   Will d/w urology-discussed with Dr. Signa Kell.  Recommended following up with plastic surgeon.  He does not see anything that can be I&D today on the ultrasound.  He suspects that this is hidradenitis and given patient reports that this has been there for a few months, and he is afebrile with normal white count I do not feel like IV antibiotics are warranted at this time.  He can follow-up outpatient.  We discussed short course of oxycodone to help with pain not to drive while on this.  He expressed understanding and felt comfortable with discharge home with the above plan   FINAL CLINICAL IMPRESSION(S) / ED DIAGNOSES   Final diagnoses:  Hidradenitis     Rx / DC Orders   ED Discharge Orders          Ordered    clindamycin (CLEOCIN) 150 MG capsule  3 times daily        08/03/23 1246    oxyCODONE (ROXICODONE) 5 MG immediate release tablet  Every 6 hours PRN        08/03/23 1246             Note:  This document was prepared using Dragon voice recognition software and may include unintentional dictation errors.   Concha Se, MD 08/03/23 1247

## 2023-08-03 NOTE — ED Triage Notes (Signed)
Pt here with a cyst on the right side of his groin x3 months. Pt states he has been here recently for the same issue and given abx but they are not helping with the pain. Pt states area is sore to the touch. Pt denies fever or pain with urination.

## 2023-08-03 NOTE — Discharge Instructions (Addendum)
Take the antibiotics and call the plastic surgeon to make a follow-up appointment.  Will cover you with antibiotics just to be safe that there is no developing infection associated with it.  You can take Tylenol 1 g every 8 hours and use the oxycodone for breakthrough pain.  Return to the ER if you develop fevers, worsening symptoms or any other concerns.   IMPRESSION: *Along the lateral aspect of right testicle, there is a collection which exhibits increased through transmission. No discrete wall. There are diffuse homogeneous low-level echoes within. No abnormal vascularity within. This may represent a small hematoma/complex collection. Correlate clinically. *Otherwise, unremarkable exam.   Take oxycodone as prescribed. Do not drink alcohol, drive or participate in any other potentially dangerous activities while taking this medication as it may make you sleepy. Do not take this medication with any other sedating medications, either prescription or over-the-counter. If you were prescribed Percocet or Vicodin, do not take these with acetaminophen (Tylenol) as it is already contained within these medications.  This medication is an opiate (or narcotic) pain medication and can be habit forming. Use it as little as possible to achieve adequate pain control. Do not use or use it with extreme caution if you have a history of opiate abuse or dependence. If you are on a pain contract with your primary care doctor or a pain specialist, be sure to let them know you were prescribed this medication today from the Emergency Department. This medication is intended for your use only - do not give any to anyone else and keep it in a secure place where nobody else, especially children, have access to it.

## 2023-08-04 LAB — CHLAMYDIA/NGC RT PCR (ARMC ONLY)
Chlamydia Tr: NOT DETECTED
N gonorrhoeae: NOT DETECTED

## 2023-08-05 ENCOUNTER — Ambulatory Visit: Payer: 59 | Admitting: Urology

## 2023-08-06 ENCOUNTER — Ambulatory Visit (INDEPENDENT_AMBULATORY_CARE_PROVIDER_SITE_OTHER): Payer: 59 | Admitting: Urology

## 2023-08-06 ENCOUNTER — Encounter: Payer: Self-pay | Admitting: Urology

## 2023-08-06 VITALS — BP 104/71 | HR 73 | Ht 65.0 in | Wt 296.0 lb

## 2023-08-06 DIAGNOSIS — L732 Hidradenitis suppurativa: Secondary | ICD-10-CM

## 2023-08-06 NOTE — Progress Notes (Signed)
I, Oscar [name], acting as a scribe for Oscar Altes, MD., have documented all relevant documentation on the behalf of Oscar Altes, MD, as directed by Oscar Altes, MD while in the presence of Oscar Altes, MD.  08/06/2023 12:21 PM   Oscar [name] 1976/02/25 [id number]  Referring provider: Leafy Ro, MD [address] [place],  [place] [address]  Chief Complaint  Patient presents with   Circumcision    HPI: Oscar [name] is a 47 y.o. male referred for circumcision evaluation.   History of chronic hidradenitis perineum/groin.  Circumcision request was initially placed in September 2024. Patient states he has no difficulty retracting his foreskin and not having any problems and was stating he just wondered if he needed to be circumcised.  He saw Dr. Everlene Farrier late October 2024 complaining of right hemiscrotal/groin swelling which was felt to be potentially related to hydradenitis. Due to his chronic hydradenitis, there was not a definable abscess. It was recommended he be seen in the hidradenitis clinic at [facility]. Patient states he has an appointment there on December 18th.  He was seen in the ED 08/03/2023 for right groin swelling. Scrotal ultrasound was performed which showed in the right lateral scrotum collection with low-level echoes. The ED had touch base with the urologist on call who reviewed the studies, and did not feel there was anything drainable and recommended plastic surgery follow-up. Patient states he did call plastic surgery and was told they do not treat hidradenitis.  No significant changes since his ED visit    PMH: Past Medical History:  Diagnosis Date   AICD (automatic cardioverter/defibrillator) present    Aortic atherosclerosis (HCC)    Asthma    Chronic respiratory failure (HCC)    CKD (chronic kidney disease), stage III (HCC)    COPD (chronic obstructive pulmonary disease) (HCC)    Deafness in right ear    Diabetes  mellitus without complication (HCC)    Dyspnea    GERD (gastroesophageal reflux disease)    Gout    HFrEF (heart failure with reduced ejection fraction) (HCC)    a.) TTE 12/10/14: EF 25%; diff inf HK; sev LV and mod LA dil; LVH. b.) TTE 06/23/18: EF 20-25%; LVH; mild LV dil, mild BAE. c.) TTE 03/01/19: EF 15%, LVH, BAE; triv PR/TR. d.) TTE 02/27/20: EF < 20%; sev LV dil; mild MR. e.) TTE 11/23/20: EF < 20%; glob HK; sev LV dil; LVH; mild-mod BAE; mod-sev TR, triv AR; G1DD. f.) TTE 05/14/21: EF <15%; LVH; sev LA and mild RV enlar; triv AR/PR, mild TR, mod MR.   Hiatal hernia    History of cardiac catheterization    a.) R/LHC 12/14/2009: normal coronaries. b.) R/LHC 12/11/2014: normal coronaries.   Hyperlipidemia    Hypertension    Hypoxemia    LBBB (left bundle branch block)    NICM (nonischemic cardiomyopathy; dilated cardiomyopathy) (HCC)    a.) R/LHC 12/14/2009: normal cors; LVEDP 18 mmHg, mean PA 29 mmHg, mean PCWP 31 mmHg; CO 6 L/min; CI 2.54 L/min/m. b.) TTE 12/10/2014: EF 25%. c.) R/LHC 12/11/2014: mean RA 9 mmHg, mean PA 22 mmHg, mean PCWP 22 mmHg. d.) TTE 06/23/2018: EF 20-25%. e.) TTE 03/01/2019: EF 15%. f.) TTE 02/27/2020: < 20%. g.) TTE 11/23/2020: EF <20%. h.) TTE 05/14/2021: < 15%.   NSTEMI (non-ST elevated myocardial infarction) (HCC)    a.) x 3 per patient report ---> 2012, 2014, 2016   Obesity  On supplemental oxygen by nasal cannula    a.) 2-3 L/[place] PRN   OSA on CPAP    PAF (paroxysmal atrial fibrillation) (HCC)    a.) CHA2DS2-VASc = 4 (HFrEF, HTN, prior MI, T2DM). b.) rate/rhythm maintained without pharmacologial intervention; no current anticoagulation.   Pancreatitis    PSVT (paroxysmal supraventricular tachycardia) [facility])     Surgical History: Past Surgical History:  Procedure Laterality Date   BIOPSY  11/25/2021   Procedure: BIOPSY;  Surgeon: Meridee Score Netty Starring., MD;  Location: [facility];  Service: Gastroenterology;;   CARDIAC CATHETERIZATION  12/11/2014    Procedure: RIGHT/LEFT HEART CATH AND CORONARY ANGIOGRAPHY;  Surgeon: Runell Gess, MD;  Location: [facility] CATH LAB;  Service: Cardiovascular;;   COLONOSCOPY WITH PROPOFOL N/A 11/13/2021   Procedure: COLONOSCOPY WITH PROPOFOL;  Surgeon: Toney Reil, MD;  Location: [facility] ENDOSCOPY;  Service: Gastroenterology;  Laterality: N/A;   ESOPHAGOGASTRODUODENOSCOPY (EGD) WITH PROPOFOL N/A 11/25/2021   Procedure: ESOPHAGOGASTRODUODENOSCOPY (EGD) WITH PROPOFOL;  Surgeon: Meridee Score Netty Starring., MD;  Location: [facility] ENDOSCOPY;  Service: Gastroenterology;  Laterality: N/A;   ICD LEAD REMOVAL N/A 03/30/2015   Procedure: ICD LEAD REMOVAL;  Surgeon: Sharion Settler, MD;  Location: [facility] ORS;  Service: Cardiovascular;  Laterality: N/A;   IMPLANTABLE CARDIOVERTER DEFIBRILLATOR IMPLANT     INCISION AND DRAINAGE ABSCESS N/A 12/12/2020   Procedure: INCISION AND DRAINAGE ABSCESS;  Surgeon: Leafy Ro, MD;  Location: [facility] ORS;  Service: General;  Laterality: N/A;   INSERT / REPLACE / REMOVE PACEMAKER     LEFT HEART CATHETERIZATION WITH CORONARY ANGIOGRAM N/A 12/09/2014   Procedure: LEFT HEART CATHETERIZATION WITH CORONARY ANGIOGRAM;  Surgeon: Kathleene Hazel, MD;  Location: [facility] CATH LAB;  Service: Cardiovascular;  Laterality: N/A;   RIGHT/LEFT HEART CATH AND CORONARY ANGIOGRAPHY Bilateral 12/14/2009   Procedure: RIGHT/LEFT HEART CATH AND CORONARY ANGIOGRAPHY; Location: [facility]; Surgeon: Rudean Hitt, MD   TONSILLECTOMY     as a child , adnoids removed   UPPER ESOPHAGEAL ENDOSCOPIC ULTRASOUND (EUS) N/A 11/25/2021   Procedure: UPPER ESOPHAGEAL ENDOSCOPIC ULTRASOUND (EUS);  Surgeon: Lemar Lofty., MD;  Location: [facility];  Service: Gastroenterology;  Laterality: N/A;    Home Medications:  Allergies as of 08/06/2023       Reactions   Ciprofloxacin Swelling, Other (See Comments)   Migraine Headache   Iodinated Contrast Media    Other reaction(s): Vomiting Projectile vomiting   Isosorb  Dinitrate-hydralazine Other (See Comments)   Migraine Headache        Medication List        Accurate as of August 06, 2023 12:21 PM. If you have any questions, ask your nurse or doctor.          amiodarone 400 MG tablet Commonly known as: PACERONE Take 400 mg by mouth daily.   Anoro Ellipta 62.5-25 MCG/ACT Aepb Generic drug: umeclidinium-vilanterol Inhale 1 puff by mouth once daily   atorvastatin 40 MG tablet Commonly known as: LIPITOR Take 40 mg by mouth daily.   carvedilol 3.125 MG tablet Commonly known as: COREG Take 1 tablet (3.125 mg total) by mouth 2 (two) times daily with a meal.   clindamycin 1 % lotion Commonly known as: Cleocin-T Apply to affected areas daily after shower.   clindamycin 150 MG capsule Commonly known as: Cleocin Take 3 capsules (450 mg total) by mouth 3 (three) times daily for 10 days.   colchicine 0.6 MG tablet Take 0.6 mg by mouth as needed (gout).   Entresto 24-26 MG Generic drug: sacubitril-valsartan  Take 0.5 tablets by mouth 2 (two) times daily.   Jardiance 10 MG Tabs tablet Generic drug: empagliflozin Take 10 mg by mouth daily.   ketoconazole 2 % cream Commonly known as: NIZORAL Apply twice daily to neck until clear   magnesium oxide 400 (240 Mg) MG tablet Commonly known as: MAG-OX Take 1 tablet (400 mg total) by mouth daily.   mupirocin ointment 2 % Commonly known as: BACTROBAN Apply topically 3 (three) times daily.   NON FORMULARY Pt uses a cpap nightly   oxyCODONE 5 MG immediate release tablet Commonly known as: Roxicodone Take 1 tablet (5 mg total) by mouth every 6 (six) hours as needed for up to 3 days.   pantoprazole 40 MG tablet Commonly known as: PROTONIX Take 40 mg by mouth daily.   torsemide 20 MG tablet Commonly known as: DEMADEX Take 40 mg by mouth daily.        Allergies:  Allergies  Allergen Reactions   Ciprofloxacin Swelling and Other (See Comments)    Migraine Headache     Iodinated Contrast Media     Other reaction(s): Vomiting Projectile vomiting   Isosorb Dinitrate-Hydralazine Other (See Comments)    Migraine Headache    Family History: Family History  Problem Relation Age of Onset   Hypertension Mother    Congestive Heart Failure Mother    Hypertension Sister    Diabetes Sister    Pancreatitis Sister    COPD Sister    Emphysema Sister        smoked   Pancreatitis Brother    Anemia Neg Hx    Arrhythmia Neg Hx    Asthma Neg Hx    Clotting disorder Neg Hx    Fainting Neg Hx    Heart attack Neg Hx    Heart disease Neg Hx    Heart failure Neg Hx    Hyperlipidemia Neg Hx     Social History:  reports that he quit smoking about 3 months ago. His smoking use included cigarettes. He has a 51 pack-year smoking history. He has been exposed to tobacco smoke. He has never used smokeless tobacco. He reports that he does not drink alcohol and does not use drugs.   Physical Exam: BP 104/71   Pulse 73   Ht 5\' 5"  (1.651 m)   Wt 296 lb (134.3 kg)   BMI 49.26 kg/m   Constitutional:  Alert and oriented, No acute distress. HEENT: [place] AT, moist mucus membranes.  Trachea midline, no masses. Cardiovascular: No clubbing, cyanosis, or edema. Respiratory: Normal respiratory effort, no increased work of breathing. GI: Abdomen is soft, nontender, nondistended, no abdominal masses GU: Phallus uncircumcised. Foreskin retracts. Testes descended bilaterally. Mass right lateral scrotum with induration and no significant fluctuance.  Skin: No rashes, bruises or suspicious lesions. Neurologic: Grossly intact, no focal deficits, moving all 4 extremities. Psychiatric: Normal mood and affect.   Pertinent Imaging: Ultrasound was personally reviewed and interpreted.   Ultrasound  EXAM: SCROTAL ULTRASOUND   DOPPLER ULTRASOUND OF THE TESTICLES   TECHNIQUE: Complete ultrasound examination of the testicles, epididymis, and other scrotal structures was performed.  Color and spectral Doppler ultrasound were also utilized to evaluate blood flow to the testicles.   COMPARISON:  CT scan abdomen and pelvis from earlier the same day.   FINDINGS: Right testicle   Measurements: 1.2 x 1.7 x 3.1 cm. No mass or microlithiasis visualized.   Left testicle   Measurements: 1.7 x 2.4 x 3.9 cm. No mass or microlithiasis  visualized.   Right epididymis:  Normal in size and appearance.   Left epididymis:  Normal in size and appearance.   Hydrocele:  None visualized.   Varicocele:  None visualized.   Pulsed Doppler interrogation of both testes demonstrates normal low resistance arterial and venous waveforms bilaterally.   Along the lateral aspect of right testicle, there is a collection which exhibits increased through transmission. No discrete wall. There are diffuse homogeneous low-level echoes within. No abnormal vascularity within. This may represent a small hematoma/complex collection. Correlate clinically.   IMPRESSION: *Along the lateral aspect of right testicle, there is a collection which exhibits increased through transmission. No discrete wall. There are diffuse homogeneous low-level echoes within. No abnormal vascularity within. This may represent a small hematoma/complex collection. Correlate clinically. *Otherwise, unremarkable exam.     Electronically Signed   By: Jules Schick M.D.   On: 08/03/2023 11:59   Assessment & Plan:    1. Chronic hidradenitis No discernible collection or abscess for drainage. We'll obtain a follow-up scrotal ultrasound 1 week to assess for any changes radiographically.  Keep appointment in the hidradenitis clinic 12/18.  2. Redundant prepuce Would not recommend elective circumcision. BMI is 49, and he would be at risk for developing a buried penis with circumcision.   I have reviewed the above documentation for accuracy and completeness, and I agree with the above.   Oscar Altes,  MD  [facility] [address] [place], [place] [address] [phone]

## 2023-08-18 ENCOUNTER — Ambulatory Visit: Payer: 59 | Admitting: Podiatry

## 2023-08-24 ENCOUNTER — Ambulatory Visit: Payer: 59 | Attending: Urology

## 2023-09-08 LAB — LAB REPORT - SCANNED
A1c: 7.8
EGFR: 55
HM HIV Screening: NEGATIVE
HM Hepatitis Screen: NEGATIVE

## 2023-09-10 ENCOUNTER — Other Ambulatory Visit: Payer: Self-pay | Admitting: Gastroenterology

## 2023-09-10 DIAGNOSIS — K861 Other chronic pancreatitis: Secondary | ICD-10-CM

## 2023-09-21 ENCOUNTER — Emergency Department: Admission: EM | Admit: 2023-09-21 | Discharge: 2023-09-21 | Discharge status: Against Medical Advice | Payer: 59

## 2023-09-21 NOTE — ED Triage Notes (Signed)
Pt up to nurses desk. States called PCP and they will see him today. NAD noted

## 2023-09-21 NOTE — Progress Notes (Unsigned)
PCP: Presley Raddle, MD (last seen 03/24) Primary Cardiologist: Dorothyann Peng, MD (last seen 10/24) HF provider: Cheral Bay, MD (last seen 05/24)  HPI:  Oscar [name] is a 48 y/o male with a history of obstructive sleep apnea (currently with CPAP), MI, HTN, VT (successful shock X 3), CKD, COPD, PAF, gout, hyperlipidemia, GERD, DM, SVT,asthma, chronic LBBB and chronic heart failure with CRT-D (2016).  Was in the ED 04/27/23 due to groin abscess X 1 month with drainage. CT done with f/u to be done with surgeon.   Admitted 06/12/23 due to ICD firing after sudden onset of SOB, chest tightness and dizziness. He was able to stand up and make it to the bathroom when he felt his ICD firing again. Afterwards, his shortness of breath, chest pain and dizziness resolved. He called EMS and on route to the ED, he was told that his ICD fired again. Successful conversion after the 3rd shock. Cardiology consulted. Needed amiodarone drip with transition to or amiodarone.   Echo 02/06/15: EF of 25% which is unchanged from previous echo on 12/10/14 which showed an EF of 25% and mild Oscar. Echo 06/23/18: EF of 20-25% along with mild Oscar.  Echo 03/01/2019: EF of 15% along with mild Oscar.  Echo 02/27/20: EF of <20% Echo 11/23/20: EF of <20% along with moderate LVH and moderately elevated PA pressure. Echo 10/20/22: EF <20% along with mildly elevated PA pressure and mild Oscar.    Stress test 03/01/2019: Abnormal myocardial perfusion scan no clear evidence of reversible ischemia inferior persistent defect either scar versus body habitus artifact severely dilated left ventricular function severely depressed LV function with ejection fraction of around 11%. Recommend medical therapy   He presents today for an acute HF follow-up visit with a chief complaint of     ROS: All systems negative except as listed in HPI, PMH and Problem List.  SH:  Social History   Socioeconomic History   Marital status: Single    Spouse name:  Not on file   Number of children: Not on file   Years of education: Not on file   Highest education level: Not on file  Occupational History   Occupation: unemployed  Tobacco Use   Smoking status: Former    Current packs/day: 0.00    Average packs/day: 1.5 packs/day for 34.0 years (51.0 ttl pk-yrs)    Types: Cigarettes    Quit date: 05/2023    Years since quitting: 0.3    Passive exposure: Past   Smokeless tobacco: Never   Tobacco comments:    Quit smoking 05/2023  Vaping Use   Vaping status: Never Used  Substance and Sexual Activity   Alcohol use: No   Drug use: No   Sexual activity: Yes  Other Topics Concern   Not on file  Social History Narrative   Live alone   Social Drivers of Health   Financial Resource Strain: Not on file  Food Insecurity: No Food Insecurity (06/13/2023)   Hunger Vital Sign    Worried About Running Out of Food in the Last Year: Never true    Ran Out of Food in the Last Year: Never true  Transportation Needs: No Transportation Needs (06/13/2023)   PRAPARE - Administrator, Civil Service (Medical): No    Lack of Transportation (Non-Medical): No  Physical Activity: Not on file  Stress: Not on file  Social Connections: Not on file  Intimate Partner Violence: Not At Risk (06/13/2023)   Humiliation, Afraid, Rape,  and Kick questionnaire    Fear of Current or Ex-Partner: No    Emotionally Abused: No    Physically Abused: No    Sexually Abused: No    FH:  Family History  Problem Relation Age of Onset   Hypertension Mother    Congestive Heart Failure Mother    Hypertension Sister    Diabetes Sister    Pancreatitis Sister    COPD Sister    Emphysema Sister        smoked   Pancreatitis Brother    Anemia Neg Hx    Arrhythmia Neg Hx    Asthma Neg Hx    Clotting disorder Neg Hx    Fainting Neg Hx    Heart attack Neg Hx    Heart disease Neg Hx    Heart failure Neg Hx    Hyperlipidemia Neg Hx     Past Medical History:   Diagnosis Date   AICD (automatic cardioverter/defibrillator) present    Aortic atherosclerosis (HCC)    Asthma    Chronic respiratory failure (HCC)    CKD (chronic kidney disease), stage III (HCC)    COPD (chronic obstructive pulmonary disease) (HCC)    Deafness in right ear    Diabetes mellitus without complication (HCC)    Dyspnea    GERD (gastroesophageal reflux disease)    Gout    HFrEF (heart failure with reduced ejection fraction) (HCC)    a.) TTE 12/10/14: EF 25%; diff inf HK; sev LV and mod LA dil; LVH. b.) TTE 06/23/18: EF 20-25%; LVH; mild LV dil, mild BAE. c.) TTE 03/01/19: EF 15%, LVH, BAE; triv PR/TR. d.) TTE 02/27/20: EF < 20%; sev LV dil; mild Oscar. e.) TTE 11/23/20: EF < 20%; glob HK; sev LV dil; LVH; mild-mod BAE; mod-sev TR, triv AR; G1DD. f.) TTE 05/14/21: EF <15%; LVH; sev LA and mild RV enlar; triv AR/PR, mild TR, mod Oscar.   Hiatal hernia    History of cardiac catheterization    a.) R/LHC 12/14/2009: normal coronaries. b.) R/LHC 12/11/2014: normal coronaries.   Hyperlipidemia    Hypertension    Hypoxemia    LBBB (left bundle branch block)    NICM (nonischemic cardiomyopathy; dilated cardiomyopathy) (HCC)    a.) R/LHC 12/14/2009: normal cors; LVEDP 18 mmHg, mean PA 29 mmHg, mean PCWP 31 mmHg; CO 6 L/min; CI 2.54 L/min/m. b.) TTE 12/10/2014: EF 25%. c.) R/LHC 12/11/2014: mean RA 9 mmHg, mean PA 22 mmHg, mean PCWP 22 mmHg. d.) TTE 06/23/2018: EF 20-25%. e.) TTE 03/01/2019: EF 15%. f.) TTE 02/27/2020: < 20%. g.) TTE 11/23/2020: EF <20%. h.) TTE 05/14/2021: < 15%.   NSTEMI (non-ST elevated myocardial infarction) (HCC)    a.) x 3 per patient report ---> 2012, 2014, 2016   Obesity    On supplemental oxygen by nasal cannula    a.) 2-3 L/[place] PRN   OSA on CPAP    PAF (paroxysmal atrial fibrillation) (HCC)    a.) CHA2DS2-VASc = 4 (HFrEF, HTN, prior MI, T2DM). b.) rate/rhythm maintained without pharmacologial intervention; no current anticoagulation.   Pancreatitis    PSVT  (paroxysmal supraventricular tachycardia) (HCC)     Current Outpatient Medications  Medication Sig Dispense Refill   amiodarone (PACERONE) 400 MG tablet Take 400 mg by mouth daily.     atorvastatin (LIPITOR) 40 MG tablet Take 40 mg by mouth daily.     carvedilol (COREG) 3.125 MG tablet Take 1 tablet (3.125 mg total) by mouth 2 (two) times daily with a  meal. 60 tablet 0   clindamycin (CLEOCIN-T) 1 % lotion Apply to affected areas daily after shower. 60 mL 3   colchicine 0.6 MG tablet Take 0.6 mg by mouth as needed (gout).     ENTRESTO 24-26 MG Take 0.5 tablets by mouth 2 (two) times daily.     JARDIANCE 10 MG TABS tablet Take 10 mg by mouth daily.     ketoconazole (NIZORAL) 2 % cream Apply twice daily to neck until clear 60 g 2   magnesium oxide (MAG-OX) 400 (240 Mg) MG tablet Take 1 tablet (400 mg total) by mouth daily. 30 tablet 0   mupirocin ointment (BACTROBAN) 2 % Apply topically 3 (three) times daily.     NON FORMULARY Pt uses a cpap nightly     pantoprazole (PROTONIX) 40 MG tablet Take 40 mg by mouth daily.     torsemide (DEMADEX) 20 MG tablet Take 40 mg by mouth daily.     umeclidinium-vilanterol (ANORO ELLIPTA) 62.5-25 MCG/ACT AEPB Inhale 1 puff by mouth once daily 180 each 3   No current facility-administered medications for this visit.     PHYSICAL EXAM:  General:  Well appearing. No resp difficulty HEENT: normal Neck: supple. JVP flat. No lymphadenopathy or thryomegaly appreciated. Cor: PMI normal. Regular rate & rhythm. No rubs, gallops or murmurs. Lungs: clear Abdomen: soft, nontender, nondistended. No hepatosplenomegaly. No bruits or masses.  Extremities: no cyanosis, clubbing, rash, edema Neuro: alert & oriented x3, cranial nerves grossly intact. Moves all 4 extremities w/o difficulty. Affect pleasant.   ECG: not done   ASSESSMENT & PLAN:  1: Chronic heart failure with reduced ejection fraction- - NYHA class II - euvolemic - weighing daily; Reminded to call  for an overnight weight gain of >2 pounds or a weekly weight gain of >5 pounds.  - weight 300.6 pounds from last visit here 3 months ago - Echo 02/06/15: EF of 25% which is unchanged from previous echo on 12/10/14 which showed an EF of 25% and mild Oscar. - Echo 06/23/18: EF of 20-25% along with mild Oscar.  - Echo 03/01/2019: EF of 15% along with mild Oscar.  - Echo 02/27/20: EF of <20% - Echo 11/23/20: EF of <20% along with moderate LVH and moderately elevated PA pressure. - Echo 10/20/22: EF <20% along with mildly elevated PA pressure and mild Oscar.  - Stress test 03/01/2019:   Abnormal myocardial perfusion scan no clear evidence of reversible ischemia inferior persistent defect either scar versus body habitus artifact severely dilated left ventricular function severely depressed LV function with ejection fraction of around 11%.   - AICD (2016) in place but denies any shocks "in a long time"; referral has been made by [facility] provider back to Dr. Maisie Fus - continue jardiance 10mg  daily - change entresto to 1/2 tablet of 24/26mg  BID instead of the whole tablet in the morning - continue carvedilol 3.125mg  BID - continue torsemide 40mg  daily - in the past, spironolactone has caused hypotension - saw HF provider Edwena Blow) 05/24 - BNP 06/12/23 was 464.6  2: HTN with CKD- - BP  - saw PCP (Mancheno) at [facility] 03/24 - BMP 08/03/23 reviewed and showed sodium 134, potassium 4.0, Cr 1.71 and GFR 49 - saw nephrology Suezanne Jacquet) 12/24  3: Obstructive sleep apnea- - wears nocturnal ventilator QHS, ~ 6-8 hours/night; reports sleeping well - continues wearing oxygen at 3L at bedtime & PRN during the day - saw pulmonology (Parrett) 10/24  4: Diabetes- - A1c 06/13/23  was 7.3% - continue jardiance 10mg  daily  5: Tobacco use- - has not smoked cigarettes in the last month - is chewing 1 piece of nicotine gum every 2 hours; encouraged him to start weaning himself off of that as he feels able -  complete cessation discussed for 3 minutes  6: Atrial fibrillation- - saw cardiology Juliann Pares) 10/24  7: VT- - magnesium 06/13/23 was 2.0 but on admission (06/12/23) it was 1.5 - continue amiodarone 400mg  daily - continue magnesium 400mg  daily

## 2023-09-22 ENCOUNTER — Ambulatory Visit: Payer: 59 | Attending: Family | Admitting: Family

## 2023-09-22 ENCOUNTER — Telehealth: Payer: Self-pay | Admitting: Pulmonary Disease

## 2023-09-22 ENCOUNTER — Encounter: Payer: Self-pay | Admitting: Family

## 2023-09-22 VITALS — BP 107/77 | HR 79 | Resp 18 | Wt 310.5 lb

## 2023-09-22 DIAGNOSIS — E785 Hyperlipidemia, unspecified: Secondary | ICD-10-CM | POA: Insufficient documentation

## 2023-09-22 DIAGNOSIS — E1122 Type 2 diabetes mellitus with diabetic chronic kidney disease: Secondary | ICD-10-CM | POA: Diagnosis not present

## 2023-09-22 DIAGNOSIS — I471 Supraventricular tachycardia, unspecified: Secondary | ICD-10-CM | POA: Diagnosis not present

## 2023-09-22 DIAGNOSIS — Z95810 Presence of automatic (implantable) cardiac defibrillator: Secondary | ICD-10-CM | POA: Insufficient documentation

## 2023-09-22 DIAGNOSIS — I5022 Chronic systolic (congestive) heart failure: Secondary | ICD-10-CM | POA: Insufficient documentation

## 2023-09-22 DIAGNOSIS — I447 Left bundle-branch block, unspecified: Secondary | ICD-10-CM | POA: Insufficient documentation

## 2023-09-22 DIAGNOSIS — M109 Gout, unspecified: Secondary | ICD-10-CM | POA: Insufficient documentation

## 2023-09-22 DIAGNOSIS — Z716 Tobacco abuse counseling: Secondary | ICD-10-CM | POA: Diagnosis not present

## 2023-09-22 DIAGNOSIS — I480 Paroxysmal atrial fibrillation: Secondary | ICD-10-CM | POA: Diagnosis not present

## 2023-09-22 DIAGNOSIS — N183 Chronic kidney disease, stage 3 unspecified: Secondary | ICD-10-CM | POA: Insufficient documentation

## 2023-09-22 DIAGNOSIS — I472 Ventricular tachycardia, unspecified: Secondary | ICD-10-CM

## 2023-09-22 DIAGNOSIS — I10 Essential (primary) hypertension: Secondary | ICD-10-CM | POA: Diagnosis not present

## 2023-09-22 DIAGNOSIS — G4733 Obstructive sleep apnea (adult) (pediatric): Secondary | ICD-10-CM | POA: Diagnosis not present

## 2023-09-22 DIAGNOSIS — Z87891 Personal history of nicotine dependence: Secondary | ICD-10-CM | POA: Insufficient documentation

## 2023-09-22 DIAGNOSIS — K219 Gastro-esophageal reflux disease without esophagitis: Secondary | ICD-10-CM | POA: Insufficient documentation

## 2023-09-22 DIAGNOSIS — I130 Hypertensive heart and chronic kidney disease with heart failure and stage 1 through stage 4 chronic kidney disease, or unspecified chronic kidney disease: Secondary | ICD-10-CM | POA: Diagnosis not present

## 2023-09-22 DIAGNOSIS — I252 Old myocardial infarction: Secondary | ICD-10-CM | POA: Insufficient documentation

## 2023-09-22 DIAGNOSIS — Z79899 Other long term (current) drug therapy: Secondary | ICD-10-CM | POA: Diagnosis not present

## 2023-09-22 DIAGNOSIS — Z720 Tobacco use: Secondary | ICD-10-CM

## 2023-09-22 DIAGNOSIS — Z01810 Encounter for preprocedural cardiovascular examination: Secondary | ICD-10-CM | POA: Diagnosis not present

## 2023-09-22 DIAGNOSIS — J4489 Other specified chronic obstructive pulmonary disease: Secondary | ICD-10-CM | POA: Insufficient documentation

## 2023-09-22 DIAGNOSIS — E119 Type 2 diabetes mellitus without complications: Secondary | ICD-10-CM

## 2023-09-22 DIAGNOSIS — Z7984 Long term (current) use of oral hypoglycemic drugs: Secondary | ICD-10-CM | POA: Diagnosis not present

## 2023-09-22 DIAGNOSIS — Z794 Long term (current) use of insulin: Secondary | ICD-10-CM

## 2023-09-22 NOTE — Telephone Encounter (Signed)
Pt states he was told after last ov to get a cxr done. Order was not found. He sees Dr. Reece Agar mid Feb

## 2023-09-22 NOTE — Telephone Encounter (Signed)
The patient was seen by Rikki Spearing, NP last.  On reading her note I think she intended to have a chest x-ray done however the patient had had a chest x-ray 10 days prior.  I think this negated having to get a new chest x-ray.  We will reassess this on follow-up appointment.  For now we can hold off on this.

## 2023-09-22 NOTE — Patient Instructions (Signed)
Call your surgeon and get an appointment scheduled.    We will call you if we need further lab work after we get the results from your primary care doctor.

## 2023-09-22 NOTE — Telephone Encounter (Signed)
Lmtcb.

## 2023-09-22 NOTE — Telephone Encounter (Signed)
Can check at office visit with Dr. Jayme Cloud .

## 2023-09-22 NOTE — Telephone Encounter (Signed)
Pt is aware of Dr. Georgann Housekeeper recommendation to wait until 10/26/23 appt. Nothing further needed

## 2023-09-28 ENCOUNTER — Encounter: Payer: Self-pay | Admitting: Surgery

## 2023-09-28 ENCOUNTER — Ambulatory Visit (INDEPENDENT_AMBULATORY_CARE_PROVIDER_SITE_OTHER): Payer: 59 | Admitting: Surgery

## 2023-09-28 VITALS — BP 109/72 | HR 92 | Temp 97.7°F | Ht 65.5 in | Wt 308.6 lb

## 2023-09-28 DIAGNOSIS — L732 Hidradenitis suppurativa: Secondary | ICD-10-CM | POA: Diagnosis not present

## 2023-09-28 NOTE — Patient Instructions (Signed)
A referral has been placed to [facility] (Dr. Jamelle Rushing). They will call you for an appointment.   Hidradenitis Suppurativa Hidradenitis suppurativa is a long-term (chronic) skin disease. It is similar to a severe form of acne, but it affects areas of the body where acne would be unusual, especially areas of the body where skin rubs against skin and becomes moist. These include: Underarms. Groin. Genital area. Buttocks. Upper thighs. Breasts. Hidradenitis suppurativa may start out as small lumps or pimples caused by blocked skin pores, sweat glands, or hair follicles. Pimples may develop into deep sores that break open (rupture) and drain pus. Over time, affected areas of skin may thicken and become scarred. This condition is rare and does not spread from person to person (non-contagious). What are the causes? The exact cause of this condition is not known. It may be related to: Male and male hormones. An overactive disease-fighting system (immune system). The immune system may over-react to blocked hair follicles or sweat glands and cause swelling and pus-filled sores. What increases the risk? You are more likely to develop this condition if you: Are male. Are 3-86 years old. Have a family history of hidradenitis suppurativa. Have a personal history of acne. Are overweight. Smoke. Take the medicine lithium. What are the signs or symptoms? The first symptoms are usually painful bumps in the skin, similar to pimples. The condition may get worse over time (progress), or it may only cause mild symptoms. If the disease progresses, symptoms may include: Skin bumps getting bigger and growing deeper into the skin. Bumps rupturing and draining pus. Itchy, infected skin. Skin getting thicker and scarred. Tunnels under the skin (fistulas) where pus drains from a bump. Pain during daily activities, such as pain during walking if your groin area is affected. Emotional problems, such as  stress or depression. This condition may affect your appearance and your ability or willingness to wear certain clothes or do certain activities. How is this diagnosed? This condition is diagnosed by a health care provider who specializes in skin conditions (dermatologist). You may be diagnosed based on: Your symptoms and medical history. A physical exam. Testing a pus sample for infection. Blood tests. How is this treated? Your treatment will depend on how severe your symptoms are. The same treatment will not work for everybody with this condition. You may need to try several treatments to find what works best for you. Treatment may include: Cleaning and bandaging (dressing) your wounds as needed. Lifestyle changes, such as new skin care routines. Taking medicines, such as: Antibiotics. Acne medicines. Medicines to reduce the activity of the immune system. A diabetes medicine (metformin). Birth control pills, for women. Steroids to reduce swelling and pain. Working with a mental health care provider, if you experience emotional distress due to this condition. If you have severe symptoms that do not get better with medicine, you may need surgery. Surgery may involve: Using a laser to clear the skin and remove hair follicles. Opening and draining deep sores. Removing the areas of skin that are diseased and scarred. Follow these instructions at home: Medicines  Take over-the-counter and prescription medicines only as told by your health care provider. If you were prescribed antibiotics, take them as told by your health care provider. Do not stop using the antibiotic even if your condition improves. Skin care If you have open wounds, cover them with a clean dressing as told by your health care provider. Keep wounds clean by washing them gently with soap and water  when you bathe. Do not shave the areas where you get hidradenitis suppurativa. Wear loose-fitting clothes. Try to avoid  getting overheated or sweaty. If you get sweaty or wet, change into clean, dry clothes as soon as you can. To help relieve pain and itchiness, cover sore areas with a warm, clean washcloth (warm compress) for 5-10 minutes as often as needed. Your healthcare provider may recommend an antiperspirant deodorant that may be gentle on your skin. A daily antiseptic wash to cleanse affected areas may be suggested by your healthcare provider. General instructions Learn as much as you can about your disease so that you have an active role in your treatment. Work closely with your health care provider to find treatments that work for you. If you are overweight, work with your health care provider to lose weight as recommended. Do not use any products that contain nicotine or tobacco. These products include cigarettes, chewing tobacco, and vaping devices, such as e-cigarettes. If you need help quitting, ask your health care provider. If you struggle with living with this condition, talk with your health care provider or work with a mental health care provider as recommended. Keep all follow-up visits. Where to find more information Hidradenitis Suppurativa Foundation, Inc.: www.hs-foundation.org American Academy of Dermatology: InfoExam.si Contact a health care provider if: You have a flare-up of hidradenitis suppurativa. You have a fever or chills. You have trouble controlling your symptoms at home. You have trouble doing your daily activities because of your symptoms. You have trouble dealing with emotional problems related to your condition. Summary Hidradenitis suppurativa is a long-term (chronic) skin disease. It is similar to a severe form of acne, but it affects areas of the body where acne would be unusual. The first symptoms are usually painful bumps in the skin, similar to pimples. The condition may only cause mild symptoms, or it may get worse over time (progress). If you have open wounds, cover  them with a clean dressing as told by your health care provider. Keep wounds clean by washing them gently with soap and water when you bathe. Besides skin care, treatment may include medicines, laser treatment, and surgery. This information is not intended to replace advice given to you by your health care provider. Make sure you discuss any questions you have with your health care provider. Document Revised: 10/09/2021 Document Reviewed: 10/09/2021 Elsevier Patient Education  2024 [employer].

## 2023-10-01 ENCOUNTER — Ambulatory Visit (INDEPENDENT_AMBULATORY_CARE_PROVIDER_SITE_OTHER): Payer: 59 | Admitting: Urology

## 2023-10-01 ENCOUNTER — Encounter: Payer: Self-pay | Admitting: Surgery

## 2023-10-01 VITALS — BP 113/77 | HR 87

## 2023-10-01 DIAGNOSIS — L732 Hidradenitis suppurativa: Secondary | ICD-10-CM | POA: Diagnosis not present

## 2023-10-01 NOTE — Progress Notes (Signed)
Outpatient Surgical Follow Up    Oscar [name] is an 48 y.o. male.   Chief Complaint  Patient presents with   Follow-up    Groin abscess    HPI:  48 year old male well-known to me with multiple comorbidities including heart failure diabetes and morbid obesity now presents with Hidradenitis  He does have a history of hidradenitis and has had multiple rounds of antibiotics, went to dermatology .  Has tried steroids and immunotherapy. He did continues to have intermittent drainage from the right scrotum. .  No fevers no chills.  No recent imaging.   Past Medical History:  Diagnosis Date   AICD (automatic cardioverter/defibrillator) present    Aortic atherosclerosis (HCC)    Asthma    Chronic respiratory failure (HCC)    CKD (chronic kidney disease), stage III (HCC)    COPD (chronic obstructive pulmonary disease) (HCC)    Deafness in right ear    Diabetes mellitus without complication (HCC)    Dyspnea    GERD (gastroesophageal reflux disease)    Gout    HFrEF (heart failure with reduced ejection fraction) (HCC)    a.) TTE 12/10/14: EF 25%; diff inf HK; sev LV and mod LA dil; LVH. b.) TTE 06/23/18: EF 20-25%; LVH; mild LV dil, mild BAE. c.) TTE 03/01/19: EF 15%, LVH, BAE; triv PR/TR. d.) TTE 02/27/20: EF < 20%; sev LV dil; mild MR. e.) TTE 11/23/20: EF < 20%; glob HK; sev LV dil; LVH; mild-mod BAE; mod-sev TR, triv AR; G1DD. f.) TTE 05/14/21: EF <15%; LVH; sev LA and mild RV enlar; triv AR/PR, mild TR, mod MR.   Hiatal hernia    History of cardiac catheterization    a.) R/LHC 12/14/2009: normal coronaries. b.) R/LHC 12/11/2014: normal coronaries.   Hyperlipidemia    Hypertension    Hypoxemia    LBBB (left bundle branch block)    NICM (nonischemic cardiomyopathy; dilated cardiomyopathy) (HCC)    a.) R/LHC 12/14/2009: normal cors; LVEDP 18 mmHg, mean PA 29 mmHg, mean PCWP 31 mmHg; CO 6 L/min; CI 2.54 L/min/m. b.) TTE 12/10/2014: EF 25%. c.) R/LHC 12/11/2014: mean RA 9 mmHg, mean PA 22  mmHg, mean PCWP 22 mmHg. d.) TTE 06/23/2018: EF 20-25%. e.) TTE 03/01/2019: EF 15%. f.) TTE 02/27/2020: < 20%. g.) TTE 11/23/2020: EF <20%. h.) TTE 05/14/2021: < 15%.   NSTEMI (non-ST elevated myocardial infarction) (HCC)    a.) x 3 per patient report ---> 2012, 2014, 2016   Obesity    On supplemental oxygen by nasal cannula    a.) 2-3 L/[place] PRN   OSA on CPAP    PAF (paroxysmal atrial fibrillation) (HCC)    a.) CHA2DS2-VASc = 4 (HFrEF, HTN, prior MI, T2DM). b.) rate/rhythm maintained without pharmacologial intervention; no current anticoagulation.   Pancreatitis    PSVT (paroxysmal supraventricular tachycardia) [facility])     Past Surgical History:  Procedure Laterality Date   BIOPSY  11/25/2021   Procedure: BIOPSY;  Surgeon: Meridee Score Netty Starring., MD;  Location: [facility];  Service: Gastroenterology;;   CARDIAC CATHETERIZATION  12/11/2014   Procedure: RIGHT/LEFT HEART CATH AND CORONARY ANGIOGRAPHY;  Surgeon: Runell Gess, MD;  Location: [facility] CATH LAB;  Service: Cardiovascular;;   COLONOSCOPY WITH PROPOFOL N/A 11/13/2021   Procedure: COLONOSCOPY WITH PROPOFOL;  Surgeon: Toney Reil, MD;  Location: [facility] ENDOSCOPY;  Service: Gastroenterology;  Laterality: N/A;   ESOPHAGOGASTRODUODENOSCOPY (EGD) WITH PROPOFOL N/A 11/25/2021   Procedure: ESOPHAGOGASTRODUODENOSCOPY (EGD) WITH PROPOFOL;  Surgeon: Meridee Score Netty Starring., MD;  Location:  [facility] ENDOSCOPY;  Service: Gastroenterology;  Laterality: N/A;   ICD LEAD REMOVAL N/A 03/30/2015   Procedure: ICD LEAD REMOVAL;  Surgeon: Sharion Settler, MD;  Location: [facility] ORS;  Service: Cardiovascular;  Laterality: N/A;   IMPLANTABLE CARDIOVERTER DEFIBRILLATOR IMPLANT     INCISION AND DRAINAGE ABSCESS N/A 12/12/2020   Procedure: INCISION AND DRAINAGE ABSCESS;  Surgeon: Leafy Ro, MD;  Location: [facility] ORS;  Service: General;  Laterality: N/A;   INSERT / REPLACE / REMOVE PACEMAKER     LEFT HEART CATHETERIZATION WITH CORONARY ANGIOGRAM N/A 12/09/2014    Procedure: LEFT HEART CATHETERIZATION WITH CORONARY ANGIOGRAM;  Surgeon: Kathleene Hazel, MD;  Location: [facility] CATH LAB;  Service: Cardiovascular;  Laterality: N/A;   RIGHT/LEFT HEART CATH AND CORONARY ANGIOGRAPHY Bilateral 12/14/2009   Procedure: RIGHT/LEFT HEART CATH AND CORONARY ANGIOGRAPHY; Location: [facility]; Surgeon: Rudean Hitt, MD   TONSILLECTOMY     as a child , adnoids removed   UPPER ESOPHAGEAL ENDOSCOPIC ULTRASOUND (EUS) N/A 11/25/2021   Procedure: UPPER ESOPHAGEAL ENDOSCOPIC ULTRASOUND (EUS);  Surgeon: Lemar Lofty., MD;  Location: [facility];  Service: Gastroenterology;  Laterality: N/A;    Family History  Problem Relation Age of Onset   Hypertension Mother    Congestive Heart Failure Mother    Hypertension Sister    Diabetes Sister    Pancreatitis Sister    COPD Sister    Emphysema Sister        smoked   Pancreatitis Brother    Anemia Neg Hx    Arrhythmia Neg Hx    Asthma Neg Hx    Clotting disorder Neg Hx    Fainting Neg Hx    Heart attack Neg Hx    Heart disease Neg Hx    Heart failure Neg Hx    Hyperlipidemia Neg Hx     Social History:  reports that he quit smoking about 4 months ago. His smoking use included cigarettes. He has a 51 pack-year smoking history. He has been exposed to tobacco smoke. He has never used smokeless tobacco. He reports that he does not drink alcohol and does not use drugs.  Allergies:  Allergies  Allergen Reactions   Ciprofloxacin Swelling and Other (See Comments)    Migraine Headache    Iodinated Contrast Media     Other reaction(s): Vomiting Projectile vomiting   Isosorb Dinitrate-Hydralazine Other (See Comments)    Migraine Headache    Medications reviewed.    ROS Full ROS performed and is otherwise negative other than what is stated in HPI   BP 109/72   Pulse 92   Temp 97.7 F (36.5 C) (Oral)   Ht 5' 5.5" (1.664 m)   Wt (!) 308 lb 9.6 oz (140 kg)   SpO2 96%   BMI 50.57 kg/m   Physical  Exam  Vitals reviewed.  Constitutional:      Appearance: Normal appearance. He is obese.  Abdominal:     General: There is no distension.     Palpations: There is no mass.     Tenderness: There is no abdominal tenderness. There is no rebound.     Hernia: No hernia is present.  Genitourinary:    Comments: right groin hidradenitis w/o abscess, minimal perineal hidradenitis. Musculoskeletal:        General: No swelling. Normal range of motion.  Skin:    General: Skin is warm and dry.     Capillary Refill: Capillary refill takes less than 2 seconds.  Neurological:  General: No focal deficit present.     Mental Status: He is alert and oriented to person, place, and time.  Psychiatric:        Mood and Affect: Mood normal.        Behavior: Behavior normal.        Thought Content: Thought content normal.        Judgment: Judgment normal.    Assessment/Plan:  48 year old male with BMI of 50 significant medical issues to include severe heart failure, DM now presents with chronic hidradenitis.  At this point I do not see evidence of a defined abscess.  He Is interesting to see urology regarding the scrotal hidradenitis.  Discussed with him again about the difficulty that hidradenitis process as far as treatment and that there are no necessarily a good surgical option . D/W GU in detail return to clinic as needed    Sterling Big, MD [facility] General Surgeon

## 2023-10-01 NOTE — Progress Notes (Signed)
I, [name], acting as a scribe for [name], MD., have documented all relevant documentation on the behalf of [name], MD, as directed by [name], MD while in the presence of [name], MD.  10/01/2023 4:54 PM   Oscar [name] 1976/03/14 [id number]  Referring provider: Preston Fleeting, MD [address] [place],  [place] [address]  Chief Complaint  Patient presents with   Follow-up    cyst on groin area (pt wants to discuss surgery)   Urologic history:  1. Chronic hidradenitis   2. Redundant prepuce  HPI: Oscar [name] is a 48 y.o. male presents for hidradenitis follow-up.   Refer to my previous note 08/06/2023. He recently saw Dr. Everlene Farrier and no identifiable drainable lesion in the groin was identified.  Complains of a scrotal lesion that tracks posteriorly and drains in the perineum. He has been followed by dermatology, treated with antibiotics and immunotherapy,    PMH: Past Medical History:  Diagnosis Date   AICD (automatic cardioverter/defibrillator) present    Aortic atherosclerosis (HCC)    Asthma    Chronic respiratory failure (HCC)    CKD (chronic kidney disease), stage III (HCC)    COPD (chronic obstructive pulmonary disease) (HCC)    Deafness in right ear    Diabetes mellitus without complication (HCC)    Dyspnea    GERD (gastroesophageal reflux disease)    Gout    HFrEF (heart failure with reduced ejection fraction) (HCC)    a.) TTE 12/10/14: EF 25%; diff inf HK; sev LV and mod LA dil; LVH. b.) TTE 06/23/18: EF 20-25%; LVH; mild LV dil, mild BAE. c.) TTE 03/01/19: EF 15%, LVH, BAE; triv PR/TR. d.) TTE 02/27/20: EF < 20%; sev LV dil; mild MR. e.) TTE 11/23/20: EF < 20%; glob HK; sev LV dil; LVH; mild-mod BAE; mod-sev TR, triv AR; G1DD. f.) TTE 05/14/21: EF <15%; LVH; sev LA and mild RV enlar; triv AR/PR, mild TR, mod MR.   Hiatal hernia    History of cardiac catheterization    a.) R/LHC 12/14/2009: normal  coronaries. b.) R/LHC 12/11/2014: normal coronaries.   Hyperlipidemia    Hypertension    Hypoxemia    LBBB (left bundle branch block)    NICM (nonischemic cardiomyopathy; dilated cardiomyopathy) (HCC)    a.) R/LHC 12/14/2009: normal cors; LVEDP 18 mmHg, mean PA 29 mmHg, mean PCWP 31 mmHg; CO 6 L/min; CI 2.54 L/min/m. b.) TTE 12/10/2014: EF 25%. c.) R/LHC 12/11/2014: mean RA 9 mmHg, mean PA 22 mmHg, mean PCWP 22 mmHg. d.) TTE 06/23/2018: EF 20-25%. e.) TTE 03/01/2019: EF 15%. f.) TTE 02/27/2020: < 20%. g.) TTE 11/23/2020: EF <20%. h.) TTE 05/14/2021: < 15%.   NSTEMI (non-ST elevated myocardial infarction) (HCC)    a.) x 3 per patient report ---> 2012, 2014, 2016   Obesity    On supplemental oxygen by nasal cannula    a.) 2-3 L/[place] PRN   OSA on CPAP    PAF (paroxysmal atrial fibrillation) (HCC)    a.) CHA2DS2-VASc = 4 (HFrEF, HTN, prior MI, T2DM). b.) rate/rhythm maintained without pharmacologial intervention; no current anticoagulation.   Pancreatitis    PSVT (paroxysmal supraventricular tachycardia) [facility])     Surgical History: Past Surgical History:  Procedure Laterality Date   BIOPSY  11/25/2021   Procedure: BIOPSY;  Surgeon: Meridee Score Netty Starring., MD;  Location: [facility] ENDOSCOPY;  Service: Gastroenterology;;   CARDIAC CATHETERIZATION  12/11/2014   Procedure: RIGHT/LEFT  HEART CATH AND CORONARY ANGIOGRAPHY;  Surgeon: Runell Gess, MD;  Location[facility] CATH LAB;  Service: Cardiovascular;;   COLONOSCOPY WITH PROPOFOL N/A 11/13/2021   Procedure: COLONOSCOPY WITH PROPOFOL;  Surgeon: Toney Reil, MD;  Location: [facility] ENDOSCOPY;  Service: Gastroenterology;  Laterality: N/A;   ESOPHAGOGASTRODUODENOSCOPY (EGD) WITH PROPOFOL N/A 11/25/2021   Procedure: ESOPHAGOGASTRODUODENOSCOPY (EGD) WITH PROPOFOL;  Surgeon: Meridee Score Netty Starring., MD;  Location: [facility] ENDOSCOPY;  Service: Gastroenterology;  Laterality: N/A;   ICD LEAD REMOVAL N/A 03/30/2015   Procedure: ICD LEAD REMOVAL;  Surgeon: Sharion Settler, MD;  Location: [facility] ORS;  Service: Cardiovascular;  Laterality: N/A;   IMPLANTABLE CARDIOVERTER DEFIBRILLATOR IMPLANT     INCISION AND DRAINAGE ABSCESS N/A 12/12/2020   Procedure: INCISION AND DRAINAGE ABSCESS;  Surgeon: Leafy Ro, MD;  Location: [facility] ORS;  Service: General;  Laterality: N/A;   INSERT / REPLACE / REMOVE PACEMAKER     LEFT HEART CATHETERIZATION WITH CORONARY ANGIOGRAM N/A 12/09/2014   Procedure: LEFT HEART CATHETERIZATION WITH CORONARY ANGIOGRAM;  Surgeon: Kathleene Hazel, MD;  Location: [facility] CATH LAB;  Service: Cardiovascular;  Laterality: N/A;   RIGHT/LEFT HEART CATH AND CORONARY ANGIOGRAPHY Bilateral 12/14/2009   Procedure: RIGHT/LEFT HEART CATH AND CORONARY ANGIOGRAPHY; Location: [facility]; Surgeon: Rudean Hitt, MD   TONSILLECTOMY     as a child , adnoids removed   UPPER ESOPHAGEAL ENDOSCOPIC ULTRASOUND (EUS) N/A 11/25/2021   Procedure: UPPER ESOPHAGEAL ENDOSCOPIC ULTRASOUND (EUS);  Surgeon: Lemar Lofty., MD;  Location: [facility]OS[facility];  Service: Gastroenterology;  Laterality: N/A;    Home Medications:  Allergies as of 10/01/2023       Reactions   Ciprofloxacin Swelling, Other (See Comments)   Migraine Headache   Iodinated Contrast Media    Other reaction(s): Vomiting Projectile vomiting   Isosorb Dinitrate-hydralazine Other (See Comments)   Migraine Headache        Medication List        Accurate as of October 01, 2023  4:54 PM. If you have any questions, ask your nurse or doctor.          amiodarone 400 MG tablet Commonly known as: PACERONE Take 400 mg by mouth daily.   Anoro Ellipta 62.5-25 MCG/ACT Aepb Generic drug: umeclidinium-vilanterol Inhale 1 puff by mouth once daily   atorvastatin 40 MG tablet Commonly known as: LIPITOR Take 40 mg by mouth daily.   carvedilol 3.125 MG tablet Commonly known as: COREG Take 1 tablet (3.125 mg total) by mouth 2 (two) times daily with a meal.   clindamycin 1 % lotion Commonly  known as: Cleocin-T Apply to affected areas daily after shower.   colchicine 0.6 MG tablet Take 0.6 mg by mouth as needed (gout).   Entresto 24-26 MG Generic drug: sacubitril-valsartan Take 0.5 tablets by mouth 2 (two) times daily.   Jardiance 10 MG Tabs tablet Generic drug: empagliflozin Take 10 mg by mouth daily.   ketoconazole 2 % cream Commonly known as: NIZORAL Apply twice daily to neck until clear   magnesium oxide 400 (240 Mg) MG tablet Commonly known as: MAG-OX Take 1 tablet (400 mg total) by mouth daily.   mupirocin ointment 2 % Commonly known as: BACTROBAN Apply topically 3 (three) times daily.   NON FORMULARY Pt uses a cpap nightly   pantoprazole 40 MG tablet Commonly known as: PROTONIX Take 40 mg by mouth daily.   torsemide 20 MG tablet Commonly known as: DEMADEX Take 40 mg by mouth daily.  Allergies:  Allergies  Allergen Reactions   Ciprofloxacin Swelling and Other (See Comments)    Migraine Headache    Iodinated Contrast Media     Other reaction(s): Vomiting Projectile vomiting   Isosorb Dinitrate-Hydralazine Other (See Comments)    Migraine Headache    Family History: Family History  Problem Relation Age of Onset   Hypertension Mother    Congestive Heart Failure Mother    Hypertension Sister    Diabetes Sister    Pancreatitis Sister    COPD Sister    Emphysema Sister        smoked   Pancreatitis Brother    Anemia Neg Hx    Arrhythmia Neg Hx    Asthma Neg Hx    Clotting disorder Neg Hx    Fainting Neg Hx    Heart attack Neg Hx    Heart disease Neg Hx    Heart failure Neg Hx    Hyperlipidemia Neg Hx     Social History:  reports that he quit smoking about 4 months ago. His smoking use included cigarettes. He has a 51 pack-year smoking history. He has been exposed to tobacco smoke. He has never used smokeless tobacco. He reports that he does not drink alcohol and does not use drugs.   Physical Exam: BP 113/77   Pulse  87   Constitutional:  Alert and oriented, No acute distress. HEENT: [place] AT Respiratory: Normal respiratory effort, no increased work of breathing. GU: Phallus without lesions. Testes descended bilaterally without masses or tenderness. There is an indurated area right inferior hemiscrotum. No fluctuance or drainage. There is induration extending back into the perineum.  Psychiatric: Normal mood and affect.    Assessment & Plan:    1. Hidradenitis I do not see any drainable lesions on exam today.  Will get a follow-up scrotal ultrasound to assess for any significant fluid collections We discussed incision and drainage plays a limited role in hidradenitis, as typically there are not abscess cavities  Currently no one at [facility] performing scrotal hidradenitis surgery, and will check to see if there are any providers in the area.    I have reviewed the above documentation for accuracy and completeness, and I agree with the above.   [name], MD  [facility] [address] [place], [place] [address] [phone]

## 2023-10-02 ENCOUNTER — Encounter: Payer: Self-pay | Admitting: Urology

## 2023-10-15 ENCOUNTER — Ambulatory Visit
Admission: RE | Admit: 2023-10-15 | Discharge: 2023-10-15 | Disposition: A | Discharge status: Home / Self Care | Payer: 59 | Source: Ambulatory Visit | Attending: Urology | Admitting: Urology

## 2023-10-15 DIAGNOSIS — L732 Hidradenitis suppurativa: Secondary | ICD-10-CM | POA: Diagnosis present

## 2023-10-19 ENCOUNTER — Telehealth: Payer: Self-pay

## 2023-10-19 DIAGNOSIS — I5022 Chronic systolic (congestive) heart failure: Secondary | ICD-10-CM

## 2023-10-19 NOTE — Telephone Encounter (Signed)
Received lab results from PCP. Per Clarisa Kindred, FNP, pt needs to get Magnesium and CBC checked. Pt lab orders sent into [facility]. Pt agreed to come to have labs drawn.

## 2023-10-26 ENCOUNTER — Other Ambulatory Visit
Admission: RE | Admit: 2023-10-26 | Discharge: 2023-10-26 | Disposition: A | Discharge status: Home / Self Care | Payer: 59 | Attending: Family | Admitting: Family

## 2023-10-26 ENCOUNTER — Encounter: Payer: Self-pay | Admitting: Pulmonary Disease

## 2023-10-26 ENCOUNTER — Ambulatory Visit (INDEPENDENT_AMBULATORY_CARE_PROVIDER_SITE_OTHER): Payer: 59 | Admitting: Pulmonary Disease

## 2023-10-26 ENCOUNTER — Telehealth: Payer: Self-pay | Admitting: *Deleted

## 2023-10-26 VITALS — BP 100/66 | HR 85 | Temp 97.6°F | Ht 65.5 in | Wt 309.6 lb

## 2023-10-26 DIAGNOSIS — E669 Obesity, unspecified: Secondary | ICD-10-CM

## 2023-10-26 DIAGNOSIS — J449 Chronic obstructive pulmonary disease, unspecified: Secondary | ICD-10-CM

## 2023-10-26 DIAGNOSIS — I5022 Chronic systolic (congestive) heart failure: Secondary | ICD-10-CM | POA: Diagnosis not present

## 2023-10-26 DIAGNOSIS — E662 Morbid (severe) obesity with alveolar hypoventilation: Secondary | ICD-10-CM

## 2023-10-26 DIAGNOSIS — Z87891 Personal history of nicotine dependence: Secondary | ICD-10-CM

## 2023-10-26 DIAGNOSIS — G4733 Obstructive sleep apnea (adult) (pediatric): Secondary | ICD-10-CM

## 2023-10-26 DIAGNOSIS — G4736 Sleep related hypoventilation in conditions classified elsewhere: Secondary | ICD-10-CM

## 2023-10-26 LAB — CBC
HCT: 39.3 % (ref 39.0–52.0)
Hemoglobin: 13.7 g/dL (ref 13.0–17.0)
MCH: 32.5 pg (ref 26.0–34.0)
MCHC: 34.9 g/dL (ref 30.0–36.0)
MCV: 93.3 fL (ref 80.0–100.0)
Platelets: 163 10*3/uL (ref 150–400)
RBC: 4.21 MIL/uL — ABNORMAL LOW (ref 4.22–5.81)
RDW: 12.1 % (ref 11.5–15.5)
WBC: 4.6 10*3/uL (ref 4.0–10.5)
nRBC: 0 % (ref 0.0–0.2)

## 2023-10-26 LAB — MAGNESIUM: Magnesium: 2.1 mg/dL (ref 1.7–2.4)

## 2023-10-26 MED ORDER — TRELEGY ELLIPTA 100-62.5-25 MCG/ACT IN AEPB: 1.0000 | INHALATION_SPRAY | Freq: Every day | RESPIRATORY_TRACT | Status: DC

## 2023-10-26 MED ORDER — TRELEGY ELLIPTA 100-62.5-25 MCG/ACT IN AEPB: 1.0000 | INHALATION_SPRAY | Freq: Every day | RESPIRATORY_TRACT | 11 refills | Status: DC

## 2023-10-26 NOTE — Patient Instructions (Signed)
 VISIT SUMMARY:  Today, we discussed your ongoing health concerns, including extreme obesity with obesity hypoventilation syndrome, chronic respiratory failure, and COPD. We reviewed your recent experience with defibrillator shocks, your use of a Trilogy machine, and your current inhaler medications. We also talked about your recent smoking cessation and its positive impact on your health.  YOUR PLAN:  -EXTREME OBESITY WITH OBESITY HYPOVENTILATION SYNDROME: This condition involves severe obesity that leads to difficulty breathing and low oxygen levels. We discussed the need to check with [employer] on the status of your current Trilogy machine and consider a new study to evaluate its effectiveness.  -CHRONIC RESPIRATORY FAILURE WITH HYPOXIA AND HYPERCARBIA: This condition is characterized by low oxygen and high carbon dioxide levels in your blood due to your obesity and COPD.  Your oxygen levels during walking health up well.  We will evaluate the data from your Trilogy machine to see if adjustments are needed.  -CHRONIC OBSTRUCTIVE PULMONARY DISEASE (COPD): COPD is a chronic lung disease that makes it hard to breathe. We will change your inhaler from Anoro to Trelegy, which you should use once a day and remember to rinse your mouth after each use to prevent oral thrush. We will also set up breathing studies to assess your current respiratory status.  INSTRUCTIONS:  Please follow up in 4-6 weeks for a re-evaluation of your conditions and to discuss the results of any new studies or adjustments to your treatment plan.

## 2023-10-26 NOTE — Telephone Encounter (Signed)
 Patient would like to know his result to the u/s done on 10/15/2023.

## 2023-10-26 NOTE — Progress Notes (Signed)
 Subjective:    Patient ID: Oscar [name], male    DOB: [birth year], 48 y.o.   MRN: [id number]  Patient Care Team: Preston Fleeting, MD as PCP - General (Family Medicine) Delma Freeze, FNP as Nurse Practitioner (Cardiology) Alwyn Pea, MD as Consulting Physician (Cardiology)  Chief Complaint  Patient presents with   Follow-up    Shortness of breath on exertion. Occasional cough. Wearing CPAP nightly. No problems with mask or pressure.     BACKGROUND/INTERVAL: Patient is a 48 year old morbidly obese former smoker whom I initially evaluated on August 2023.  Last saw Oscar Oaks, NP on 25 June 2023.  HPI Discussed the use of AI scribe software for clinical note transcription with the patient, who gave verbal consent to proceed.  History of Present Illness   Oscar [name] is a 48 year old male with extreme obesity with alveolar hypoventilation, severe obstructive sleep apnea,and COPD who presents for follow-up.  In October, he experienced three defibrillator shocks, raising concerns about potential cardiac arrhythmias. This event necessitates ongoing monitoring and possibly further cardiac evaluation.  He has follow-up with cardiology.  His LVEF is less than 20%.  He has been using a Trilogy noninvasive vent for sleep apnea for five to seven years but continues to experience fatigue upon waking and often sleeps during the afternoons.  His compliance download is not available today.  He quit smoking in September, which he believes has been beneficial for his health. He is currently using Anoro and albuterol inhalers for COPD management. Despite this, he experiences occasional wheezing and uses oxygen intermittently during the day when needed.  Previously PFTs were ordered however the patient did not follow through with requested testing.  He occasionally uses oxygen during the day when experiencing wheezing but does not use it continuously. No swelling in his  feet and no tenderness in his back.   Not had any fevers, chills or sweats.  No chest pain.     DATA 01/26/2015 sleep study [facility]: Severe sleep apnea AHI 49.9/h, REM related AHI 81.6/h desat to 53.6% during sleep.  Patient titrated to CPAP of 14 cm H2O. 05/08/2016 repeat sleep study: Severe sleep apnea, AHI of 73.1/h, O2 desats to 69% eventually titrated to non- invasive ventilation (Trilogy vent). 11/23/2020 echocardiogram: LVEF less than 20%.  Left ventricular function severely depressed.  Grade 1 DD, moderately reduced right ventricular systolic function, moderately elevated pulmonary artery systolic pressure biatrial enlargement.  Mild MR. 10/20/2022 echocardiogram: LVEF less than 20%, global hypokinesis of the left ventricle, moderately dilated left ventricle.  Right ventricular systolic function is normal, right ventricular size is normal mildly elevated pulmonary artery systolic pressure.  Mild mitral regurgitation.  Review of Systems A 10 point review of systems was performed and it is as noted above otherwise negative.   Patient Active Problem List   Diagnosis Date Noted   Abscess and cellulitis of gluteal region 07/02/2023   Ventricular tachycardia (HCC) 06/12/2023   Demand ischemia (HCC) 06/12/2023   Colon cancer screening    Polyp of sigmoid colon    Idiopathic gout, unspecified site 09/26/2021   Implantable cardioverter-defibrillator (ICD) in situ 09/26/2021   Chronic kidney disease, stage 3a (HCC) 09/26/2021   Hyperlipidemia, unspecified 09/26/2021   Perineal abscess 12/12/2020   Flash pulmonary edema (HCC)    Pulmonary edema 11/25/2020   Obesity, Class III, BMI 40-49.9 (morbid obesity) (HCC) 11/24/2020   Acute on chronic diastolic (congestive) heart failure (HCC) 11/24/2020   Acute exacerbation  of CHF (congestive heart failure) (HCC) 11/23/2020   Hepatic cirrhosis, unspecified hepatic cirrhosis type, unspecified whether ascites present (HCC) 11/23/2020   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) 11/23/2020   Obesity hypoventilation syndrome (HCC) 11/23/2020   Atrial fibrillation with RVR (HCC) 11/23/2020   ARF (acute renal failure) (HCC) 03/28/2020   Acute on chronic combined systolic (congestive) and diastolic (congestive) heart failure (HCC) 02/27/2020   COPD (chronic obstructive pulmonary disease) (HCC) 02/27/2020   Sprain of foot 01/26/2020   Foreign body granuloma of skin and subcutaneous tissue 05/26/2019   Acute pancreatitis 05/19/2017   Pancreatitis 09/12/2016   Bronchitis 05/20/2016   Constipation 11/08/2015   Abdominal pain 11/02/2015   Obstructive sleep apnea 07/31/2015   Tobacco use 07/31/2015   Diabetes (HCC) 07/10/2015   Chronic systolic heart failure (HCC) 03/30/2015   Cardiac pacemaker 03/30/2015   Chronic respiratory failure with hypercapnia (HCC) 12/12/2014   Elevated troponin    Non-ischemic cardiomyopathy (HCC)    Hypoxemia    Acute combined systolic and diastolic heart failure (HCC)    SVT (supraventricular tachycardia) (HCC) 12/09/2014   NSTEMI (non-ST elevated myocardial infarction) (HCC) 12/09/2014   Hypertension 12/09/2014    Social History   Tobacco Use   Smoking status: Former    Current packs/day: 0.00    Average packs/day: 1.5 packs/day for 34.0 years (51.0 ttl pk-yrs)    Types: Cigarettes    Quit date: 05/2023    Years since quitting: 0.4    Passive exposure: Past   Smokeless tobacco: Never   Tobacco comments:    Quit smoking 05/2023  Substance Use Topics   Alcohol use: No    Allergies  Allergen Reactions   Ciprofloxacin Swelling and Other (See Comments)    Migraine Headache    Iodinated Contrast Media     Other reaction(s): Vomiting Projectile vomiting   Isosorb Dinitrate-Hydralazine Other (See Comments)    Migraine Headache    Current Meds  Medication Sig   amiodarone (PACERONE) 400 MG tablet Take 400 mg by mouth daily.   atorvastatin (LIPITOR) 40  MG tablet Take 40 mg by mouth daily.   carvedilol (COREG) 3.125 MG tablet Take 1 tablet (3.125 mg total) by mouth 2 (two) times daily with a meal.   clindamycin (CLEOCIN-T) 1 % lotion Apply to affected areas daily after shower.   colchicine 0.6 MG tablet Take 0.6 mg by mouth as needed (gout).   ENTRESTO 24-26 MG Take 0.5 tablets by mouth 2 (two) times daily.   Fluticasone-Umeclidin-Vilant (TRELEGY ELLIPTA) 100-62.5-25 MCG/ACT AEPB Inhale 1 puff into the lungs daily.   JARDIANCE 10 MG TABS tablet Take 10 mg by mouth daily.   ketoconazole (NIZORAL) 2 % cream Apply twice daily to neck until clear   magnesium oxide (MAG-OX) 400 (240 Mg) MG tablet Take 1 tablet (400 mg total) by mouth daily.   mupirocin ointment (BACTROBAN) 2 % Apply topically 3 (three) times daily.   NON FORMULARY Pt uses a cpap nightly   pantoprazole (PROTONIX) 40 MG tablet Take 40 mg by mouth daily.   torsemide (DEMADEX) 20 MG tablet Take 40 mg by mouth daily.   [DISCONTINUED] umeclidinium-vilanterol (ANORO ELLIPTA) 62.5-25 MCG/ACT AEPB Inhale 1 puff by mouth once daily    Immunization History  Administered Date(s) Administered   Influenza-Unspecified 03/02/2023   Moderna Sars-Covid-2 Vaccination 12/31/2019, 02/04/2020        Objective:     BP 100/66 (BP Location: Left Arm, Patient Position: Sitting, Cuff Size:  Normal)   Pulse 85   Temp 97.6 F (36.4 C) (Temporal)   Ht 5' 5.5" (1.664 m)   Wt (!) 309 lb 9.6 oz (140.4 kg)   SpO2 98%   BMI 50.74 kg/m   SpO2: 98 %  GENERAL: Morbidly obese man, no acute distress.  Older than stated age.  No conversational dyspnea, fully ambulatory. HEAD: Normocephalic, atraumatic.  EYES: Pupils equal, round, reactive to light.  No scleral icterus.  MOUTH: Oral mucosa moist.  No thrush.  Mallampati class IV .  Loose upper.  Presentation [place]. NECK: Supple. No thyromegaly. Trachea midline. No JVD.  No adenopathy. PULMONARY: Good air entry bilaterally.  No adventitious  sounds. CARDIOVASCULAR: S1 and S2. Regular rate and rhythm.  Grade 1-2/6 systolic ejection murmur left sternal border.  ICD present left chest. ABDOMEN: Obese, otherwise benign. MUSCULOSKELETAL: No joint deformity, no clubbing, no edema.  NEUROLOGIC: Neuro grossly normal. SKIN: Intact,warm,dry. PSYCH: Mood and behavior normal.   Ambulatory oxymetry was performed today:  At rest on room air oxygen saturation was 97%, the patient ambulated at a slow pace, completed 2 laps, O2 nadir 94%, mild shortness of breath.  Resting heart rate was 88 bpm at maximum for this exercise any 95 bpm.   Assessment & Plan:     ICD-10-CM   1. COPD suggested by initial evaluation [facility])  J44.9 Pulmonary Function Test [facility] Only    2. Obstructive sleep apnea - SEVERE  G47.33    Study September 2017 AHI 73.1/h O2 nadir 69%    3. Nocturnal hypoxemia due to obesity  E66.9    G47.36     4. Chronic systolic heart failure (HCC)  W09.81     5. Class 3 obesity with alveolar hypoventilation without serious comorbidity with body mass index (BMI) of 50.0 to 59.9 in adult [facility])  X91.478    E66.2    Z68.43     6. Former smoker  Z87.891       Orders Placed This Encounter  Procedures   Pulmonary Function Test [facility] Only    Standing Status:   Future    Expiration Date:   10/25/2024    Full PFT: includes the following: basic spirometry, spirometry pre & post bronchodilator, diffusion capacity (DLCO), lung volumes:   Full PFT    This test can only be performed at:   [facility] ordered this encounter  Medications   Fluticasone-Umeclidin-Vilant (TRELEGY ELLIPTA) 100-62.5-25 MCG/ACT AEPB    Sig: Inhale 1 puff into the lungs daily.    Dispense:  28 each    Lot Number?:   [id number]    Expiration Date?:   01/30/2025    Quantity:   2   Fluticasone-Umeclidin-Vilant (TRELEGY ELLIPTA) 100-62.5-25 MCG/ACT AEPB    Sig: Inhale 1 puff into the lungs daily.    Dispense:  60 each    Refill:  11   Discussion:     Extreme Obesity with Obesity Hypoventilation Syndrome Chronic condition characterized by extreme obesity leading to hypoventilation, chronic respiratory failure with hypoxia and hypercarbia. He has been using a Trilogy ventilator for 5-7 years but reports persistent fatigue and nonrestorative sleep. The defibrillator went off three times in October, indicating potential cardiac issues. Discussed the possibility of needing a new Trilogy vent or restudying to evaluate its effectiveness.  Await compliance download. - Check with [employer] on the current Trilogy ventilator compliance download - Consider restudying to evaluate the effectiveness of the current Trilogy ventilator  Chronic Respiratory Failure  with Hypoxia and Hypercarbia Secondary to extreme obesity and COPD. Uses oxygen intermittently and reports persistent fatigue despite using a noninvasive ventilator. Discussed monitoring oxygen levels during walking and evaluating noninvasive ventilator data from [employer] to determine if adjustments are needed. - Monitor oxygen levels during walking, no desaturations noted. - Evaluate noninvasive ventilator data from [employer]  Chronic Obstructive Pulmonary Disease (COPD) Uses Anoro and albuterol inhalers. Reports occasional wheezing and uses oxygen intermittently. Quit smoking since September, which is a positive step towards managing COPD. Discussed changing Anoro to Trelegy for better management and the importance of rinsing mouth after use to prevent oral thrush. - Change Anoro to Trelegy, one puff a day, and instruct to rinse mouth after use - Set up PFTs to assess current respiratory status - Follow-up in 4-6 weeks  Follow-up - Follow-up appointment in 4-6 weeks.     Advised if symptoms do not improve or worsen, to please contact office for sooner follow up or seek emergency care.    I spent 40 minutes of dedicated to the care of this patient on the date of this encounter to include pre-visit review of  records, face-to-face time with the patient discussing conditions above, post visit ordering of testing, clinical documentation with the electronic health record, making appropriate referrals as documented, and communicating necessary findings to members of the patients care team.     C. Danice Goltz, MD Advanced Bronchoscopy PCCM [facility]    *This note was generated using voice recognition software/Dragon and/or AI transcription program.  Despite best efforts to proofread, errors can occur which can change the meaning. Any transcriptional errors that result from this process are unintentional and may not be fully corrected at the time of dictation.

## 2023-10-27 NOTE — Telephone Encounter (Signed)
 The abnormality previously seen on ultrasound has decreased in size.  I am in the process of getting him referred to one of the reconstructive urologists at [facility] who has experience with hidradenitis.  He has to be referred by dermatology at [facility].  Since he is followed by dermatology there I have messaged his dermatologist to see if that will refer him.

## 2023-10-28 NOTE — Telephone Encounter (Signed)
Notified patient as instructed.   

## 2023-11-07 ENCOUNTER — Emergency Department
Admission: EM | Admit: 2023-11-07 | Discharge: 2023-11-07 | Disposition: A | Discharge status: Home / Self Care | Attending: Emergency Medicine | Admitting: Emergency Medicine

## 2023-11-07 ENCOUNTER — Emergency Department

## 2023-11-07 ENCOUNTER — Other Ambulatory Visit: Payer: Self-pay

## 2023-11-07 DIAGNOSIS — M109 Gout, unspecified: Secondary | ICD-10-CM | POA: Insufficient documentation

## 2023-11-07 DIAGNOSIS — R079 Chest pain, unspecified: Secondary | ICD-10-CM | POA: Diagnosis present

## 2023-11-07 DIAGNOSIS — J449 Chronic obstructive pulmonary disease, unspecified: Secondary | ICD-10-CM | POA: Diagnosis not present

## 2023-11-07 DIAGNOSIS — I5022 Chronic systolic (congestive) heart failure: Secondary | ICD-10-CM | POA: Insufficient documentation

## 2023-11-07 DIAGNOSIS — E119 Type 2 diabetes mellitus without complications: Secondary | ICD-10-CM | POA: Diagnosis not present

## 2023-11-07 LAB — BASIC METABOLIC PANEL
Anion gap: 10 (ref 5–15)
BUN: 35 mg/dL — ABNORMAL HIGH (ref 6–20)
CO2: 24 mmol/L (ref 22–32)
Calcium: 8.8 mg/dL — ABNORMAL LOW (ref 8.9–10.3)
Chloride: 97 mmol/L — ABNORMAL LOW (ref 98–111)
Creatinine, Ser: 1.68 mg/dL — ABNORMAL HIGH (ref 0.61–1.24)
GFR, Estimated: 50 mL/min — ABNORMAL LOW (ref >60)
Glucose, Bld: 194 mg/dL — ABNORMAL HIGH (ref 70–99)
Potassium: 4.4 mmol/L (ref 3.5–5.1)
Sodium: 131 mmol/L — ABNORMAL LOW (ref 135–145)

## 2023-11-07 LAB — CBC
HCT: 37.8 % — ABNORMAL LOW (ref 39.0–52.0)
Hemoglobin: 13.3 g/dL (ref 13.0–17.0)
MCH: 32.8 pg (ref 26.0–34.0)
MCHC: 35.2 g/dL (ref 30.0–36.0)
MCV: 93.1 fL (ref 80.0–100.0)
Platelets: 147 10*3/uL — ABNORMAL LOW (ref 150–400)
RBC: 4.06 MIL/uL — ABNORMAL LOW (ref 4.22–5.81)
RDW: 12.1 % (ref 11.5–15.5)
WBC: 5.4 10*3/uL (ref 4.0–10.5)
nRBC: 0 % (ref 0.0–0.2)

## 2023-11-07 LAB — TROPONIN I (HIGH SENSITIVITY)
Troponin I (High Sensitivity): 79 ng/L — ABNORMAL HIGH (ref <18)
Troponin I (High Sensitivity): 75 ng/L — ABNORMAL HIGH (ref <18)

## 2023-11-07 MED ORDER — COLCHICINE 0.6 MG PO TABS: 0.6000 mg | ORAL_TABLET | ORAL | 0 refills | Status: DC | PRN

## 2023-11-07 MED ORDER — OXYCODONE HCL 5 MG PO TABS: 5.0000 mg | ORAL_TABLET | Freq: Four times a day (QID) | ORAL | 0 refills | Status: AC | PRN

## 2023-11-07 MED ORDER — OXYCODONE HCL 5 MG PO TABS
5.0000 mg | ORAL_TABLET | Freq: Once | ORAL | Status: AC
  Administered 2023-11-07: 5 mg via ORAL
  Filled 2023-11-07: qty 1

## 2023-11-07 NOTE — ED Provider Notes (Addendum)
 [facility] Provider Note    None    (approximate)   History   Chest Pain   HPI  Oscar [name] is a 48 y.o. male  V. tach, ICD on amiodarone, chronic systolic heart failure, COPD, diabetes who comes in with chest pain.  I reviewed the note where patient was admitted on 10/11 until 10/12.  During this admission patient's ICD had fired and he was on amiodarone drip and transition to oral amiodarone  Patient states that it is not really his chest that is hurting is actually his right elbow.  He reports a history of gout that he intermittently will take colchicine for.  He reports 1 to 2 weeks ago he had a gout flare in his toes and he took the colchicine.  He states that after a few days however he started developing some pain now in his right elbow.  He states he has never had gout in his elbow before but he denies any history of fevers, redness, warmth.  He reports just difficulty moving the elbow secondary to the pain.  He denies any injury.  He thinks that because of the pain in his elbow that he developed a little twinge in his chest.  He denies any firing of his ICD, syncope or other concerns.  His biggest concern is his right elbow.  Physical Exam   Triage Vital Signs: ED Triage Vitals  Encounter Vitals Group     BP 11/07/23 1415 106/76     Systolic BP Percentile --      Diastolic BP Percentile --      Pulse Rate 11/07/23 1415 99     Resp 11/07/23 1415 19     Temp 11/07/23 1416 98.6 F (37 C)     Temp Source 11/07/23 1416 Oral     SpO2 11/07/23 1415 98 %     Weight --      Height --      Head Circumference --      Peak Flow --      Pain Score 11/07/23 1411 5     Pain Loc --      Pain Education --      Exclude from Growth Chart --     Most recent vital signs: Vitals:   11/07/23 1415 11/07/23 1416  BP: 106/76   Pulse: 99   Resp: 19   Temp:  98.6 F (37 C)  SpO2: 98%      General: Awake, no distress.  CV:  Good peripheral  perfusion.  Resp:  Normal effort.  Abd:  No distention.  Other:  Patient is able to slightly straighten his arm but somewhat limited due to pain at the elbow.  There is no redness, no warmth noted.  Good distal pulses sensation intact nerves are intact.   ED Results / Procedures / Treatments   Labs (all labs ordered are listed, but only abnormal results are displayed) Labs Reviewed  BASIC METABOLIC PANEL - Abnormal; Notable for the following components:      Result Value   Sodium 131 (*)    Chloride 97 (*)    Glucose, Bld 194 (*)    BUN 35 (*)    Creatinine, Ser 1.68 (*)    Calcium 8.8 (*)    GFR, Estimated 50 (*)    All other components within normal limits  CBC - Abnormal; Notable for the following components:   RBC 4.06 (*)    HCT 37.8 (*)  Platelets 147 (*)    All other components within normal limits  TROPONIN I (HIGH SENSITIVITY) - Abnormal; Notable for the following components:   Troponin I (High Sensitivity) 79 (*)    All other components within normal limits     EKG  My interpretation of EKG:  Normal sinus rate 97 with left bundle branch block negative Sgarbossa criteria, T wave inversions in aVL.  Reviewed prior EKG on 09/22/2023 and this was similar  RADIOLOGY I have reviewed the xray personally and interpreted no evidence of any pneumonia, defibrillator in place   PROCEDURES:  Critical Care performed: No  .1-3 Lead EKG Interpretation  Performed by: Concha Se, MD Authorized by: Concha Se, MD     Interpretation: normal     ECG rate:  90   ECG rate assessment: normal     Rhythm: sinus rhythm     Ectopy: none     Conduction: normal      MEDICATIONS ORDERED IN ED: Medications - No data to display   IMPRESSION / MDM / ASSESSMENT AND PLAN / ED COURSE  I reviewed the triage vital signs and the nursing notes.   Patient's presentation is most consistent with acute presentation with potential threat to life or bodily function.  Triage  complaint was chest pain but to me patient reports is not really chest pain is actually his right elbow.  He does report a little twinge in his chest but he states that it was more of his right elbow that was bothering him.  He has been taking the colchicine with some mild relief in symptoms but he states that he has never had gout in his elbow before so he just want to make sure there is nothing else going on.  He denies any fevers. Denies any trauma- NV intact.  Exam seems most likely consistent with gout given patient's prior history of this.  Troponin is elevated at 79.  BMP shows slightly low sodium of 131 but creatinine is stable at 1.68.  CBC shows stable hemoglobin  Given patient's renal issues we discussed maximum doses of colchicine and the risk of the kidneys with colchicine but he reports having taken it previously without any issues given he is already been on colchicine for acute few days I did recommend holding off for 3 days before starting another treatment of colchicine.  We discussed steroids but patient states that he has a high sugars with the oral steroids.  He states that sometimes he is had steroid injections.  I will give him orthopedics number for follow-up to get steroid injection in the meantime treat with oxycodone.  Repeat evaluation we discussed different options at this time I suspect this is most likely gout.  He reports having this amount of pain when he had gout in his ankles before his oxycodone has helped his pain.  He is already taking colchicine for 3 days we discussed doing a 3-day break before trialing colchicine again he is requesting for another prescription of it.  Given his kidney function has tolerated the colchicine well I feel like if we give him a 3-day break he can retry it again on Tuesday.  In the meantime we will prescribe some oxycodone and recommended he go to the orthopedic clinic to consider steroid injection.  At this time I do not feel that this  represents a septic joint, abscess or other acute pathology.  Patient also feels comfortable with this plan.  I provided a sling  for comfort we discussed range of motion exercises to prevent from getting stiff  His troponins were elevated I considered admission but patient really denies any chest pain he reports his chest and his right elbow and they are downtrending and chronically elevated.  For his slightly low sodium he has follow-up with the heart failure team on Tuesday and will discuss this with them  Patient stated he ran out of his colchicine I went to prescribe it has a lot of flags coming up which I did discuss with patient but he is adamant he has been on it previously has done well with it.  I did discuss the hesitation with the colchicine and he expressed understanding.  He stated that he would try to go to the orthopedic doctors on Monday for potential steroid injection and will only use this if absolutely necessary given he is already taken 3 days worth of colchicine.  I did also receive a call about the amiodarone and the colchicine being combined with pharmacy and explained to them that patient has taken it previously and done okay and his kidney function today is at his baseline.  I discussed again with patient my concerns with the colchicine he is aware he is going to try to limit having to use this and try to do the steroid injection he understands the dangers and risk to his kidneys.  The patient is on the cardiac monitor to evaluate for evidence of arrhythmia and/or significant heart rate changes.      FINAL CLINICAL IMPRESSION(S) / ED DIAGNOSES   Final diagnoses:  Acute gout of right elbow, unspecified cause     Rx / DC Orders   ED Discharge Orders          Ordered    oxyCODONE (ROXICODONE) 5 MG immediate release tablet  Every 6 hours PRN        11/07/23 1706    colchicine 0.6 MG tablet  As needed        11/07/23 1709             Note:  This document was  prepared using Dragon voice recognition software and may include unintentional dictation errors.   Concha Se, MD 11/07/23 1712    Concha Se, MD 11/07/23 [phone]

## 2023-11-07 NOTE — ED Triage Notes (Addendum)
 Pt c/o constant central CP and right arm/elbow pain x3 days. Pt denies N/VD, SHOB, dizzness. Pt denies injury or malpositioning during sleep, denies numbness, tingling, strength equal bilaterally. Pt caox4, nad noted. Pt states he took OxyContin w/ some relief prior to arrival.

## 2023-11-07 NOTE — Discharge Instructions (Addendum)
   The colchicine can be hard on your kidneys especially given all your medications you are on so I would try to avoid using it if necessary and definitely wait 3 days before trying this again given you have already been taking it for 3 days.  I would actually recommend on Monday you go to [facility] walk-in [facility] or you call to make an appointment to discuss possible steroid injection.  In the meantime we prescribed a sling, oxycodone to help with pain.  Return to the ER with worsening symptoms, fever, or any other concerns  Initial: 1.2mg , followed by 0.6 every 1-2 hours; some clinicians recommend a maximum of 3 doses. Wait at least 3 days before initiating another course of therapy

## 2023-11-09 ENCOUNTER — Telehealth: Payer: Self-pay | Admitting: Family

## 2023-11-09 ENCOUNTER — Ambulatory Visit: Admitting: Surgery

## 2023-11-09 NOTE — Telephone Encounter (Signed)
 Pt confirmed appt on 11/10/23

## 2023-11-09 NOTE — Progress Notes (Unsigned)
 Advanced [facility] Note   PCP: Presley Raddle, MD (last seen 01/25) Primary Cardiologist: Dorothyann Peng, MD (last seen 10/24) HF provider: Cheral Bay, MD (last seen 05/24)  Chief Complaint: shortness of breath  HPI:  [name] is a 48 y/o male with a history of obstructive sleep apnea (currently with CPAP), MI, HTN, VT (successful shock X 3), CKD, COPD, PAF, gout, hyperlipidemia, GERD, DM, SVT,asthma, chronic LBBB and chronic heart failure with CRT-D (2016).  Stress test 03/01/2019: Abnormal myocardial perfusion scan no clear evidence of reversible ischemia inferior persistent defect either scar versus body habitus artifact severely dilated left ventricular function severely depressed LV function with ejection fraction of around 11%. Recommend medical therapy   Echo 03/01/2019: EF of 15% along with mild MR.  Echo 02/27/20: EF of <20% Echo 11/23/20: EF of <20% along with moderate LVH and moderately elevated PA pressure. Echo 10/20/22: EF <20% along with mildly elevated PA pressure and mild MR.   Was in the ED 04/27/23 due to groin abscess X 1 month with drainage. CT done with f/u to be done with surgeon.   Admitted 06/12/23 due to ICD firing after sudden onset of SOB, chest tightness and dizziness. He was able to stand up and make it to the bathroom when he felt his ICD firing again. Afterwards, his shortness of breath, chest pain and dizziness resolved. He called EMS and on route to the ED, he was told that his ICD fired again. Successful conversion after the 3rd shock. Cardiology consulted. Needed amiodarone drip with transition to or amiodarone.   Was in the ED 11/07/23 with right elbow pain. Has a history of gout. Was given colchicine but has to wait 3 days before starting because he had been taking it daily for several weeks.  He presents today for a HF follow-up visit with a chief complaint of minimal shortness of breath with moderate exertion. Has fatigue, sternal chest  pain, occasional palpitations and right elbow pain along with this. Denies cough, palpitations, abdominal distention, pedal edema, dizziness, difficulty sleeping or weight gain. Has been off amiodarone and carvedilol since 10/24.  Has also not been on jardiance for "awhile". Has upcoming cMRI next week with f/u with [facility] cardiology.   ROS: All systems negative except as listed in HPI, PMH and Problem List.  SH:  Social History   Socioeconomic History   Marital status: Single    Spouse name: Not on file   Number of children: Not on file   Years of education: Not on file   Highest education level: Not on file  Occupational History   Occupation: unemployed  Tobacco Use   Smoking status: Former    Current packs/day: 0.00    Average packs/day: 1.5 packs/day for 34.0 years (51.0 ttl pk-yrs)    Types: Cigarettes    Quit date: 05/2023    Years since quitting: 0.5    Passive exposure: Past   Smokeless tobacco: Never   Tobacco comments:    Quit smoking 05/2023  Vaping Use   Vaping status: Never Used  Substance and Sexual Activity   Alcohol use: No   Drug use: No   Sexual activity: Yes  Other Topics Concern   Not on file  Social History Narrative   Live alone   Social Drivers of Health   Financial Resource Strain: Not on file  Food Insecurity: No Food Insecurity (06/13/2023)   Hunger Vital Sign    Worried About Running Out of Food in the Last  Year: Never true    Ran Out of Food in the Last Year: Never true  Transportation Needs: No Transportation Needs (06/13/2023)   PRAPARE - Administrator, Civil Service (Medical): No    Lack of Transportation (Non-Medical): No  Physical Activity: Not on file  Stress: Not on file  Social Connections: Not on file  Intimate Partner Violence: Not At Risk (06/13/2023)   Humiliation, Afraid, Rape, and Kick questionnaire    Fear of Current or Ex-Partner: No    Emotionally Abused: No    Physically Abused: No    Sexually Abused: No     FH:  Family History  Problem Relation Age of Onset   Hypertension Mother    Congestive Heart Failure Mother    Hypertension Sister    Diabetes Sister    Pancreatitis Sister    COPD Sister    Emphysema Sister        smoked   Pancreatitis Brother    Anemia Neg Hx    Arrhythmia Neg Hx    Asthma Neg Hx    Clotting disorder Neg Hx    Fainting Neg Hx    Heart attack Neg Hx    Heart disease Neg Hx    Heart failure Neg Hx    Hyperlipidemia Neg Hx     Past Medical History:  Diagnosis Date   AICD (automatic cardioverter/defibrillator) present    Aortic atherosclerosis (HCC)    Asthma    Chronic respiratory failure (HCC)    CKD (chronic kidney disease), stage III (HCC)    COPD (chronic obstructive pulmonary disease) (HCC)    Deafness in right ear    Diabetes mellitus without complication (HCC)    Dyspnea    GERD (gastroesophageal reflux disease)    Gout    HFrEF (heart failure with reduced ejection fraction) (HCC)    a.) TTE 12/10/14: EF 25%; diff inf HK; sev LV and mod LA dil; LVH. b.) TTE 06/23/18: EF 20-25%; LVH; mild LV dil, mild BAE. c.) TTE 03/01/19: EF 15%, LVH, BAE; triv PR/TR. d.) TTE 02/27/20: EF < 20%; sev LV dil; mild MR. e.) TTE 11/23/20: EF < 20%; glob HK; sev LV dil; LVH; mild-mod BAE; mod-sev TR, triv AR; G1DD. f.) TTE 05/14/21: EF <15%; LVH; sev LA and mild RV enlar; triv AR/PR, mild TR, mod MR.   Hiatal hernia    History of cardiac catheterization    a.) R/LHC 12/14/2009: normal coronaries. b.) R/LHC 12/11/2014: normal coronaries.   Hyperlipidemia    Hypertension    Hypoxemia    LBBB (left bundle branch block)    NICM (nonischemic cardiomyopathy; dilated cardiomyopathy) (HCC)    a.) R/LHC 12/14/2009: normal cors; LVEDP 18 mmHg, mean PA 29 mmHg, mean PCWP 31 mmHg; CO 6 L/min; CI 2.54 L/min/m. b.) TTE 12/10/2014: EF 25%. c.) R/LHC 12/11/2014: mean RA 9 mmHg, mean PA 22 mmHg, mean PCWP 22 mmHg. d.) TTE 06/23/2018: EF 20-25%. e.) TTE 03/01/2019: EF 15%. f.) TTE  02/27/2020: < 20%. g.) TTE 11/23/2020: EF <20%. h.) TTE 05/14/2021: < 15%.   NSTEMI (non-ST elevated myocardial infarction) (HCC)    a.) x 3 per patient report ---> 2012, 2014, 2016   Obesity    On supplemental oxygen by nasal cannula    a.) 2-3 L/[place] PRN   OSA on CPAP    PAF (paroxysmal atrial fibrillation) (HCC)    a.) CHA2DS2-VASc = 4 (HFrEF, HTN, prior MI, T2DM). b.) rate/rhythm maintained without pharmacologial intervention; no current anticoagulation.  Pancreatitis    PSVT (paroxysmal supraventricular tachycardia) (HCC)     Current Outpatient Medications  Medication Sig Dispense Refill   amiodarone (PACERONE) 400 MG tablet Take 400 mg by mouth daily.     atorvastatin (LIPITOR) 40 MG tablet Take 40 mg by mouth daily.     carvedilol (COREG) 3.125 MG tablet Take 1 tablet (3.125 mg total) by mouth 2 (two) times daily with a meal. 60 tablet 0   clindamycin (CLEOCIN-T) 1 % lotion Apply to affected areas daily after shower. 60 mL 3   colchicine 0.6 MG tablet Take 1 tablet (0.6 mg total) by mouth as needed for up to 1 day. Initial: 1.2mg  (2 Pills), followed by 0.6 (1 pill) every 1-2 hours; Max of 3 doses. 5 tablet 0   ENTRESTO 24-26 MG Take 0.5 tablets by mouth 2 (two) times daily.     Fluticasone-Umeclidin-Vilant (TRELEGY ELLIPTA) 100-62.5-25 MCG/ACT AEPB Inhale 1 puff into the lungs daily. 28 each    [START ON 11/16/2023] Fluticasone-Umeclidin-Vilant (TRELEGY ELLIPTA) 100-62.5-25 MCG/ACT AEPB Inhale 1 puff into the lungs daily. 60 each 11   JARDIANCE 10 MG TABS tablet Take 10 mg by mouth daily.     ketoconazole (NIZORAL) 2 % cream Apply twice daily to neck until clear 60 g 2   magnesium oxide (MAG-OX) 400 (240 Mg) MG tablet Take 1 tablet (400 mg total) by mouth daily. 30 tablet 0   mupirocin ointment (BACTROBAN) 2 % Apply topically 3 (three) times daily.     NON FORMULARY Pt uses a cpap nightly     oxyCODONE (ROXICODONE) 5 MG immediate release tablet Take 1 tablet (5 mg total) by mouth  every 6 (six) hours as needed for up to 5 days. 20 tablet 0   pantoprazole (PROTONIX) 40 MG tablet Take 40 mg by mouth daily.     torsemide (DEMADEX) 20 MG tablet Take 40 mg by mouth daily.     No current facility-administered medications for this visit.   Vitals:   11/10/23 0913  BP: 96/77  Pulse: 84  SpO2: 96%  Weight: (!) 310 lb (140.6 kg)   Wt Readings from Last 3 Encounters:  11/10/23 (!) 310 lb (140.6 kg)  10/26/23 (!) 309 lb 9.6 oz (140.4 kg)  09/28/23 (!) 308 lb 9.6 oz (140 kg)   Lab Results  Component Value Date   CREATININE 1.68 (H) 11/07/2023   CREATININE 1.71 (H) 08/03/2023   CREATININE 2.93 (H) 07/02/2023    PHYSICAL EXAM:  General: Well appearing. No resp difficulty HEENT: normal Neck: supple, no JVD Cor: Regular rhythm, rate. No rubs, gallops or murmurs Lungs: clear Abdomen: soft, nontender, nondistended. Extremities: no cyanosis, clubbing, rash, edema; reports tenderness and limited ROM in right elbow Neuro: alert & oriented X 3. Moves all 4 extremities w/o difficulty. Affect pleasant   ECG: not done   ASSESSMENT & PLAN:  1: Chronic heart failure with reduced ejection fraction- - NYHA class II - euvolemic - weighing daily; Reminded to call for an overnight weight gain of >2 pounds or a weekly weight gain of >5 pounds.  - weight stable from last visit here 6 weeks ago - Stress test 03/01/2019:   Abnormal myocardial perfusion scan no clear evidence of reversible ischemia inferior persistent defect either scar versus body habitus artifact severely dilated left ventricular function severely depressed LV function with ejection fraction of around 11%.   - Echo 03/01/2019: EF of 15% along with mild MR.  - Echo 02/27/20: EF of <20% -  Echo 11/23/20: EF of <20% along with moderate LVH and moderately elevated PA pressure. - Echo 10/20/22: EF <20% along with mildly elevated PA pressure and mild MR.  - AICD (2016) in place but denies any shocks "in a long time" -  continue entresto 1/2 tablet of 24/26mg  BID  - continue torsemide 40mg  daily; consider decreasing this to allow for BP elevation - has been off carvedilol and jardiance for "awhile" due to hypotension - in the past, spironolactone has caused hypotension - saw HF provider Edwena Blow) 05/24 - BNP 06/12/23 was 464.6  2: HTN with CKD- - BP 96/77 - saw PCP (Mancheno) at [facility] ~ 2 months ago - BMP 11/07/23 reviewed and showed sodium 131, potassium 4.4, Cr 1.68 and GFR 50 - saw nephrology Suezanne Jacquet) 12/24  3: Obstructive sleep apnea- - wears nocturnal ventilator QHS, ~ 6-8 hours/night; reports sleeping well - continues wearing oxygen at 3L at bedtime & PRN during the day - saw pulmonology Jayme Cloud) 02/25  4: Diabetes- - A1c 06/13/23 was 7.3% - continue atorvastatin 40mg  daily  5: Tobacco use- - has not smoked cigarettes since 10/24 - is chewing 1 piece of nicotine gum when he "gets the urge" - complete cessation discussed for 3 minutes  6: Atrial fibrillation- - saw cardiology Juliann Pares) 10/24 - has not been on amiodarone in "awhile"  7: VT- - continue magnesium 400mg  daily - magnesium 06/13/23 was 2.0  - Mg 10/26/23 reviewed and was 2.1   Offered to make appt PRN since he's seeing cardiology and HF provider with [facility] but he prefers to continue coming. Emphasized the importance of bringing medication bottles to every visit and to also take the bottles to other provider appointments. Explained my concern that no one's medication list may be 100% accurate. Cardiology note from 10/24 has multiple meds listed that he's no longer taking and we have meds not listed that he says he's taking although he can't remember names of them. He verbalizes understanding.   Delma Freeze, FNP 11/09/23

## 2023-11-10 ENCOUNTER — Ambulatory Visit: Payer: 59 | Attending: Family | Admitting: Family

## 2023-11-10 ENCOUNTER — Encounter: Payer: Self-pay | Admitting: Family

## 2023-11-10 VITALS — BP 96/77 | HR 84 | Wt 310.0 lb

## 2023-11-10 DIAGNOSIS — E119 Type 2 diabetes mellitus without complications: Secondary | ICD-10-CM | POA: Diagnosis not present

## 2023-11-10 DIAGNOSIS — I480 Paroxysmal atrial fibrillation: Secondary | ICD-10-CM | POA: Diagnosis not present

## 2023-11-10 DIAGNOSIS — I130 Hypertensive heart and chronic kidney disease with heart failure and stage 1 through stage 4 chronic kidney disease, or unspecified chronic kidney disease: Secondary | ICD-10-CM | POA: Diagnosis not present

## 2023-11-10 DIAGNOSIS — E785 Hyperlipidemia, unspecified: Secondary | ICD-10-CM | POA: Diagnosis not present

## 2023-11-10 DIAGNOSIS — Z95810 Presence of automatic (implantable) cardiac defibrillator: Secondary | ICD-10-CM | POA: Diagnosis not present

## 2023-11-10 DIAGNOSIS — J4489 Other specified chronic obstructive pulmonary disease: Secondary | ICD-10-CM | POA: Insufficient documentation

## 2023-11-10 DIAGNOSIS — I10 Essential (primary) hypertension: Secondary | ICD-10-CM

## 2023-11-10 DIAGNOSIS — M109 Gout, unspecified: Secondary | ICD-10-CM | POA: Insufficient documentation

## 2023-11-10 DIAGNOSIS — I472 Ventricular tachycardia, unspecified: Secondary | ICD-10-CM | POA: Insufficient documentation

## 2023-11-10 DIAGNOSIS — N183 Chronic kidney disease, stage 3 unspecified: Secondary | ICD-10-CM | POA: Diagnosis not present

## 2023-11-10 DIAGNOSIS — E1122 Type 2 diabetes mellitus with diabetic chronic kidney disease: Secondary | ICD-10-CM | POA: Diagnosis not present

## 2023-11-10 DIAGNOSIS — I5022 Chronic systolic (congestive) heart failure: Secondary | ICD-10-CM | POA: Insufficient documentation

## 2023-11-10 DIAGNOSIS — Z87891 Personal history of nicotine dependence: Secondary | ICD-10-CM | POA: Diagnosis not present

## 2023-11-10 DIAGNOSIS — G4733 Obstructive sleep apnea (adult) (pediatric): Secondary | ICD-10-CM

## 2023-11-10 DIAGNOSIS — Z794 Long term (current) use of insulin: Secondary | ICD-10-CM

## 2023-11-10 DIAGNOSIS — K219 Gastro-esophageal reflux disease without esophagitis: Secondary | ICD-10-CM | POA: Diagnosis not present

## 2023-11-10 DIAGNOSIS — I252 Old myocardial infarction: Secondary | ICD-10-CM | POA: Insufficient documentation

## 2023-11-10 DIAGNOSIS — I447 Left bundle-branch block, unspecified: Secondary | ICD-10-CM | POA: Insufficient documentation

## 2023-11-10 DIAGNOSIS — Z720 Tobacco use: Secondary | ICD-10-CM

## 2023-11-10 DIAGNOSIS — R0602 Shortness of breath: Secondary | ICD-10-CM | POA: Diagnosis present

## 2023-11-10 DIAGNOSIS — I471 Supraventricular tachycardia, unspecified: Secondary | ICD-10-CM | POA: Insufficient documentation

## 2023-11-10 NOTE — Patient Instructions (Signed)
 Please bring all of your medications to your appointments!   Follow-Up in: 4 months with Clarisa Kindred, FNP  At the [facility], you and your health needs are our priority. We have a designated team specialized in the treatment of Heart Failure. This Care Team includes your primary Heart Failure Specialized Cardiologist (physician), Advanced Practice Providers (APPs- Physician Assistants and Nurse Practitioners), and Pharmacist who all work together to provide you with the care you need, when you need it.   You may see any of the following providers on your designated Care Team at your next follow up:  Dr. Arvilla Meres Dr. Marca Ancona Dr. Dorthula Nettles Dr. Theresia Bough Clarisa Kindred, FNP Enos Fling, RPH-CPP  Please be sure to bring in all your medications bottles to every appointment.   Need to Contact us:  If you have any questions or concerns before your next appointment please send [place] a message through [place] or call our office at [phone].    TO LEAVE A MESSAGE FOR THE NURSE SELECT OPTION 2, PLEASE LEAVE A MESSAGE INCLUDING: YOUR NAME DATE OF BIRTH CALL BACK NUMBER REASON FOR CALL**this is important as we prioritize the call backs  YOU WILL RECEIVE A CALL BACK THE SAME DAY AS LONG AS YOU CALL BEFORE 4:00 PM

## 2023-11-26 ENCOUNTER — Ambulatory Visit (INDEPENDENT_AMBULATORY_CARE_PROVIDER_SITE_OTHER): Admitting: Podiatry

## 2023-11-26 DIAGNOSIS — Z91198 Patient's noncompliance with other medical treatment and regimen for other reason: Secondary | ICD-10-CM

## 2023-11-26 NOTE — Progress Notes (Signed)
 1. Failure to attend appointment with reason given    Patient sick. Will call back to reschedule.

## 2023-12-01 ENCOUNTER — Observation Stay
Admit: 2023-12-01 | Discharge: 2023-12-01 | Disposition: A | Discharge status: Home / Self Care | Attending: Family Medicine | Admitting: Family Medicine

## 2023-12-01 ENCOUNTER — Encounter: Payer: Self-pay | Admitting: Emergency Medicine

## 2023-12-01 ENCOUNTER — Observation Stay
Admission: EM | Admit: 2023-12-01 | Discharge: 2023-12-03 | Disposition: A | Discharge status: Home / Self Care | Attending: Internal Medicine | Admitting: Internal Medicine

## 2023-12-01 ENCOUNTER — Other Ambulatory Visit: Payer: Self-pay

## 2023-12-01 ENCOUNTER — Emergency Department

## 2023-12-01 DIAGNOSIS — M109 Gout, unspecified: Secondary | ICD-10-CM

## 2023-12-01 DIAGNOSIS — N1831 Chronic kidney disease, stage 3a: Secondary | ICD-10-CM | POA: Insufficient documentation

## 2023-12-01 DIAGNOSIS — Z95810 Presence of automatic (implantable) cardiac defibrillator: Secondary | ICD-10-CM | POA: Insufficient documentation

## 2023-12-01 DIAGNOSIS — I509 Heart failure, unspecified: Secondary | ICD-10-CM

## 2023-12-01 DIAGNOSIS — E0965 Drug or chemical induced diabetes mellitus with hyperglycemia: Secondary | ICD-10-CM | POA: Insufficient documentation

## 2023-12-01 DIAGNOSIS — Z79899 Other long term (current) drug therapy: Secondary | ICD-10-CM | POA: Insufficient documentation

## 2023-12-01 DIAGNOSIS — J9621 Acute and chronic respiratory failure with hypoxia: Secondary | ICD-10-CM

## 2023-12-01 DIAGNOSIS — I130 Hypertensive heart and chronic kidney disease with heart failure and stage 1 through stage 4 chronic kidney disease, or unspecified chronic kidney disease: Secondary | ICD-10-CM | POA: Diagnosis not present

## 2023-12-01 DIAGNOSIS — I214 Non-ST elevation (NSTEMI) myocardial infarction: Secondary | ICD-10-CM | POA: Insufficient documentation

## 2023-12-01 DIAGNOSIS — I5023 Acute on chronic systolic (congestive) heart failure: Principal | ICD-10-CM | POA: Insufficient documentation

## 2023-12-01 DIAGNOSIS — E669 Obesity, unspecified: Secondary | ICD-10-CM

## 2023-12-01 DIAGNOSIS — I5A Non-ischemic myocardial injury (non-traumatic): Secondary | ICD-10-CM | POA: Insufficient documentation

## 2023-12-01 DIAGNOSIS — I480 Paroxysmal atrial fibrillation: Secondary | ICD-10-CM | POA: Insufficient documentation

## 2023-12-01 DIAGNOSIS — Z87891 Personal history of nicotine dependence: Secondary | ICD-10-CM | POA: Diagnosis not present

## 2023-12-01 DIAGNOSIS — G4733 Obstructive sleep apnea (adult) (pediatric): Secondary | ICD-10-CM | POA: Diagnosis not present

## 2023-12-01 DIAGNOSIS — E1149 Type 2 diabetes mellitus with other diabetic neurological complication: Secondary | ICD-10-CM | POA: Diagnosis not present

## 2023-12-01 DIAGNOSIS — J441 Chronic obstructive pulmonary disease with (acute) exacerbation: Secondary | ICD-10-CM | POA: Diagnosis not present

## 2023-12-01 DIAGNOSIS — R059 Cough, unspecified: Secondary | ICD-10-CM | POA: Diagnosis present

## 2023-12-01 DIAGNOSIS — I5043 Acute on chronic combined systolic (congestive) and diastolic (congestive) heart failure: Principal | ICD-10-CM

## 2023-12-01 DIAGNOSIS — E785 Hyperlipidemia, unspecified: Secondary | ICD-10-CM

## 2023-12-01 DIAGNOSIS — M1000 Idiopathic gout, unspecified site: Secondary | ICD-10-CM | POA: Diagnosis not present

## 2023-12-01 DIAGNOSIS — E1122 Type 2 diabetes mellitus with diabetic chronic kidney disease: Secondary | ICD-10-CM | POA: Insufficient documentation

## 2023-12-01 LAB — TROPONIN I (HIGH SENSITIVITY)
Troponin I (High Sensitivity): 92 ng/L — ABNORMAL HIGH (ref <18)
Troponin I (High Sensitivity): 74 ng/L — ABNORMAL HIGH (ref <18)

## 2023-12-01 LAB — CBG MONITORING, ED: Glucose-Capillary: 419 mg/dL — ABNORMAL HIGH (ref 70–99)

## 2023-12-01 LAB — CBC
HCT: 37.6 % — ABNORMAL LOW (ref 39.0–52.0)
Hemoglobin: 13.2 g/dL (ref 13.0–17.0)
MCH: 32.6 pg (ref 26.0–34.0)
MCHC: 35.1 g/dL (ref 30.0–36.0)
MCV: 92.8 fL (ref 80.0–100.0)
Platelets: 144 10*3/uL — ABNORMAL LOW (ref 150–400)
RBC: 4.05 MIL/uL — ABNORMAL LOW (ref 4.22–5.81)
RDW: 12 % (ref 11.5–15.5)
WBC: 5 10*3/uL (ref 4.0–10.5)
nRBC: 0 % (ref 0.0–0.2)

## 2023-12-01 LAB — BASIC METABOLIC PANEL WITH GFR
Anion gap: 11 (ref 5–15)
BUN: 51 mg/dL — ABNORMAL HIGH (ref 6–20)
CO2: 22 mmol/L (ref 22–32)
Calcium: 8.9 mg/dL (ref 8.9–10.3)
Chloride: 101 mmol/L (ref 98–111)
Creatinine, Ser: 1.61 mg/dL — ABNORMAL HIGH (ref 0.61–1.24)
GFR, Estimated: 52 mL/min — ABNORMAL LOW (ref >60)
Glucose, Bld: 170 mg/dL — ABNORMAL HIGH (ref 70–99)
Potassium: 4 mmol/L (ref 3.5–5.1)
Sodium: 134 mmol/L — ABNORMAL LOW (ref 135–145)

## 2023-12-01 LAB — BRAIN NATRIURETIC PEPTIDE: B Natriuretic Peptide: 319.6 pg/mL — ABNORMAL HIGH (ref 0.0–100.0)

## 2023-12-01 MED ORDER — METHYLPREDNISOLONE SODIUM SUCC 40 MG IJ SOLR
40.0000 mg | Freq: Two times a day (BID) | INTRAMUSCULAR | Status: AC
  Administered 2023-12-01 – 2023-12-02 (×2): 40 mg via INTRAVENOUS
  Filled 2023-12-01 (×2): qty 1

## 2023-12-01 MED ORDER — IPRATROPIUM-ALBUTEROL 0.5-2.5 (3) MG/3ML IN SOLN
3.0000 mL | Freq: Four times a day (QID) | RESPIRATORY_TRACT | Status: DC
  Administered 2023-12-01 – 2023-12-03 (×8): 3 mL via RESPIRATORY_TRACT
  Filled 2023-12-01 (×8): qty 3

## 2023-12-01 MED ORDER — METHYLPREDNISOLONE SODIUM SUCC 125 MG IJ SOLR
125.0000 mg | Freq: Once | INTRAMUSCULAR | Status: AC
  Administered 2023-12-01: 125 mg via INTRAVENOUS
  Filled 2023-12-01: qty 2

## 2023-12-01 MED ORDER — SODIUM CHLORIDE 0.9 % IV SOLN: 250.0000 mL | INTRAVENOUS | Status: AC | PRN

## 2023-12-01 MED ORDER — FUROSEMIDE 10 MG/ML IJ SOLN
60.0000 mg | Freq: Once | INTRAMUSCULAR | Status: AC
  Administered 2023-12-01: 60 mg via INTRAVENOUS
  Filled 2023-12-01: qty 8

## 2023-12-01 MED ORDER — ONDANSETRON HCL 4 MG/2ML IJ SOLN: 4.0000 mg | Freq: Four times a day (QID) | INTRAMUSCULAR | Status: DC | PRN

## 2023-12-01 MED ORDER — AZITHROMYCIN 250 MG PO TABS
500.0000 mg | ORAL_TABLET | Freq: Every day | ORAL | Status: AC
  Administered 2023-12-02 – 2023-12-03 (×2): 500 mg via ORAL
  Filled 2023-12-01: qty 1
  Filled 2023-12-01: qty 2

## 2023-12-01 MED ORDER — INSULIN ASPART 100 UNIT/ML IJ SOLN
0.0000 [IU] | Freq: Three times a day (TID) | INTRAMUSCULAR | Status: DC
  Administered 2023-12-01: 15 [IU] via SUBCUTANEOUS
  Administered 2023-12-02 (×3): 11 [IU] via SUBCUTANEOUS
  Administered 2023-12-03: 15 [IU] via SUBCUTANEOUS
  Filled 2023-12-01 (×5): qty 1

## 2023-12-01 MED ORDER — PREDNISONE 20 MG PO TABS
40.0000 mg | ORAL_TABLET | Freq: Every day | ORAL | Status: DC
  Administered 2023-12-03: 40 mg via ORAL
  Filled 2023-12-01: qty 2

## 2023-12-01 MED ORDER — IPRATROPIUM-ALBUTEROL 0.5-2.5 (3) MG/3ML IN SOLN
3.0000 mL | Freq: Once | RESPIRATORY_TRACT | Status: AC
  Administered 2023-12-01: 3 mL via RESPIRATORY_TRACT
  Filled 2023-12-01: qty 3

## 2023-12-01 MED ORDER — ONDANSETRON HCL 4 MG PO TABS
4.0000 mg | ORAL_TABLET | Freq: Four times a day (QID) | ORAL | Status: DC | PRN
Start: 2023-12-01 — End: 2023-12-03

## 2023-12-01 MED ORDER — FUROSEMIDE 10 MG/ML IJ SOLN
60.0000 mg | Freq: Two times a day (BID) | INTRAMUSCULAR | Status: DC
  Administered 2023-12-01: 60 mg via INTRAVENOUS
  Filled 2023-12-01: qty 8

## 2023-12-01 MED ORDER — ALLOPURINOL 100 MG PO TABS
100.0000 mg | ORAL_TABLET | Freq: Every day | ORAL | Status: DC
  Administered 2023-12-02 – 2023-12-03 (×2): 100 mg via ORAL
  Filled 2023-12-01 (×2): qty 1

## 2023-12-01 MED ORDER — ALBUTEROL SULFATE HFA 108 (90 BASE) MCG/ACT IN AERS: 2.0000 | INHALATION_SPRAY | RESPIRATORY_TRACT | Status: DC | PRN

## 2023-12-01 MED ORDER — POTASSIUM CHLORIDE CRYS ER 20 MEQ PO TBCR
40.0000 meq | EXTENDED_RELEASE_TABLET | Freq: Once | ORAL | Status: AC
  Administered 2023-12-01: 40 meq via ORAL
  Filled 2023-12-01: qty 2

## 2023-12-01 MED ORDER — ALBUTEROL SULFATE (2.5 MG/3ML) 0.083% IN NEBU: 2.5000 mg | INHALATION_SOLUTION | RESPIRATORY_TRACT | Status: DC | PRN

## 2023-12-01 MED ORDER — SODIUM CHLORIDE 0.9% FLUSH: 3.0000 mL | INTRAVENOUS | Status: DC | PRN

## 2023-12-01 MED ORDER — PERFLUTREN LIPID MICROSPHERE
1.0000 mL | INTRAVENOUS | Status: AC | PRN
  Administered 2023-12-01: 1 mL via INTRAVENOUS

## 2023-12-01 MED ORDER — APIXABAN 5 MG PO TABS
5.0000 mg | ORAL_TABLET | Freq: Two times a day (BID) | ORAL | Status: DC
  Administered 2023-12-01 – 2023-12-02 (×3): 5 mg via ORAL
  Filled 2023-12-01 (×4): qty 1

## 2023-12-01 MED ORDER — SODIUM CHLORIDE 0.9 % IV SOLN
500.0000 mg | INTRAVENOUS | Status: AC
  Administered 2023-12-01: 500 mg via INTRAVENOUS
  Filled 2023-12-01: qty 5

## 2023-12-01 MED ORDER — ACETAMINOPHEN 325 MG PO TABS: 650.0000 mg | ORAL_TABLET | Freq: Four times a day (QID) | ORAL | Status: DC | PRN

## 2023-12-01 MED ORDER — SODIUM CHLORIDE 0.9% FLUSH
3.0000 mL | Freq: Two times a day (BID) | INTRAVENOUS | Status: DC
  Administered 2023-12-01 – 2023-12-03 (×4): 3 mL via INTRAVENOUS

## 2023-12-01 MED ORDER — ATORVASTATIN CALCIUM 20 MG PO TABS
40.0000 mg | ORAL_TABLET | Freq: Every day | ORAL | Status: DC
Start: 2023-12-01 — End: 2023-12-03
  Administered 2023-12-01 – 2023-12-02 (×2): 40 mg via ORAL
  Filled 2023-12-01 (×2): qty 2

## 2023-12-01 MED ORDER — ENOXAPARIN SODIUM 80 MG/0.8ML IJ SOSY: 70.0000 mg | PREFILLED_SYRINGE | INTRAMUSCULAR | Status: DC

## 2023-12-01 MED ORDER — FUROSEMIDE 10 MG/ML IJ SOLN: 60.0000 mg | Freq: Once | INTRAMUSCULAR | Status: DC

## 2023-12-01 MED ORDER — PANTOPRAZOLE SODIUM 40 MG PO TBEC
40.0000 mg | DELAYED_RELEASE_TABLET | Freq: Every day | ORAL | Status: DC
Start: 2023-12-01 — End: 2023-12-03
  Administered 2023-12-01 – 2023-12-03 (×3): 40 mg via ORAL
  Filled 2023-12-01 (×3): qty 1

## 2023-12-01 NOTE — ED Triage Notes (Addendum)
 Pt to ED via POV from home. Pt reports cough x3 wks that will not go away. Pt reports wears 3L [place] at baseline. Pt presented to ED without oxygen and RA sats 95%. Pt placed on his 3L [place] that he wears at baseline. Pt reports SOB w/ exertion and orthopnea. Pt reports on diuretics.

## 2023-12-01 NOTE — ED Notes (Signed)
 See triage note  Presents with some cough and SOB  No fever  But states the cough has been preset for 3 weeks

## 2023-12-01 NOTE — Assessment & Plan Note (Signed)
 Cr 1.6 w/ GFR in the 50s  Near baseline  Monitor

## 2023-12-01 NOTE — H&P (Addendum)
 History and Physical    Patient: Oscar [name]:[id number] DOB: [birth year] DOA: 12/01/2023 DOS: the patient was seen and examined on 12/01/2023 PCP: Preston Fleeting, MD  Patient coming from: Home  Chief Complaint:  Chief Complaint  Patient presents with   Cough   HPI: [name] is a 48 y.o. male with medical history significant of HFrEF EF less than 20% secondary to nonischemic dilated cardiomyopathy, type 2 diabetes, CKD stage IIIa, chronic hypoxic respiratory failure on as needed 3 L of supplemental oxygen, paroxysmal atrial fibrillation not on AC, OSA on CPAP, hypertension, hyperlipidemia, gout, COPD presented with acute on chronic respiratory failure with hypoxia, acute on chronic HFrEF, COPD exacerbation.  Patient reports increased work of breathing over multiple days.  Positive cough wheezing and increased pedal production.  Also with orthopnea and PND.  Has been compliant with home diuretic regimen.  Does admit to eating high salt.  No reported NSAID intake.  No longer smokes.  No reported alcohol or illicit drug use.  No abdominal pain vomiting or diarrhea.  No focal hemiparesis or confusion.  Symptoms have progressively worsened at home despite home inhaler use.  Has had to slowly uptitrate oxygen use past 3 L. Presented to the ER afebrile, hemodynamically stable.  Requiring 4-5 Lnasal cannula to keep O2 sats greater than 90%.  White count 5, hemoglobin 13.2, platelets 144, troponin 92-74.  BNP of 320.  Creatinine 1.61, glucose 170.  Chest x-ray cardiomegaly and pulmonary vascular congestion. Review of Systems: As mentioned in the history of present illness. All other systems reviewed and are negative. Past Medical History:  Diagnosis Date   AICD (automatic cardioverter/defibrillator) present    Aortic atherosclerosis (HCC)    Asthma    Chronic respiratory failure (HCC)    CKD (chronic kidney disease), stage III (HCC)    COPD (chronic obstructive pulmonary disease)  (HCC)    Deafness in right ear    Diabetes mellitus without complication (HCC)    Dyspnea    GERD (gastroesophageal reflux disease)    Gout    HFrEF (heart failure with reduced ejection fraction) (HCC)    a.) TTE 12/10/14: EF 25%; diff inf HK; sev LV and mod LA dil; LVH. b.) TTE 06/23/18: EF 20-25%; LVH; mild LV dil, mild BAE. c.) TTE 03/01/19: EF 15%, LVH, BAE; triv PR/TR. d.) TTE 02/27/20: EF < 20%; sev LV dil; mild MR. e.) TTE 11/23/20: EF < 20%; glob HK; sev LV dil; LVH; mild-mod BAE; mod-sev TR, triv AR; G1DD. f.) TTE 05/14/21: EF <15%; LVH; sev LA and mild RV enlar; triv AR/PR, mild TR, mod MR.   Hiatal hernia    History of cardiac catheterization    a.) R/LHC 12/14/2009: normal coronaries. b.) R/LHC 12/11/2014: normal coronaries.   Hyperlipidemia    Hypertension    Hypoxemia    LBBB (left bundle branch block)    NICM (nonischemic cardiomyopathy; dilated cardiomyopathy) (HCC)    a.) R/LHC 12/14/2009: normal cors; LVEDP 18 mmHg, mean PA 29 mmHg, mean PCWP 31 mmHg; CO 6 L/min; CI 2.54 L/min/m. b.) TTE 12/10/2014: EF 25%. c.) R/LHC 12/11/2014: mean RA 9 mmHg, mean PA 22 mmHg, mean PCWP 22 mmHg. d.) TTE 06/23/2018: EF 20-25%. e.) TTE 03/01/2019: EF 15%. f.) TTE 02/27/2020: < 20%. g.) TTE 11/23/2020: EF <20%. h.) TTE 05/14/2021: < 15%.   NSTEMI (non-ST elevated myocardial infarction) (HCC)    a.) x 3 per patient report ---> 2012, 2014, 2016   Obesity    On  supplemental oxygen by nasal cannula    a.) 2-3 L/[place] PRN   OSA on CPAP    PAF (paroxysmal atrial fibrillation) (HCC)    a.) CHA2DS2-VASc = 4 (HFrEF, HTN, prior MI, T2DM). b.) rate/rhythm maintained without pharmacologial intervention; no current anticoagulation.   Pancreatitis    PSVT (paroxysmal supraventricular tachycardia) [facility])    Past Surgical History:  Procedure Laterality Date   BIOPSY  11/25/2021   Procedure: BIOPSY;  Surgeon: Meridee Score Netty Starring., MD;  Location: [facility] ENDOSCOPY;  Service: Gastroenterology;;   CARDIAC  CATHETERIZATION  12/11/2014   Procedure: RIGHT/LEFT HEART CATH AND CORONARY ANGIOGRAPHY;  Surgeon: Runell Gess, MD;  Location: [facility] CATH LAB;  Service: Cardiovascular;;   COLONOSCOPY WITH PROPOFOL N/A 11/13/2021   Procedure: COLONOSCOPY WITH PROPOFOL;  Surgeon: Toney Reil, MD;  Location[facility] ENDOSCOPY;  Service: Gastroenterology;  Laterality: N/A;   ESOPHAGOGASTRODUODENOSCOPY (EGD) WITH PROPOFOL N/A 11/25/2021   Procedure: ESOPHAGOGASTRODUODENOSCOPY (EGD) WITH PROPOFOL;  Surgeon: Meridee Score Netty Starring., MD;  Location: [facility] ENDOSCOPY;  Service: Gastroenterology;  Laterality: N/A;   ICD LEAD REMOVAL N/A 03/30/2015   Procedure: ICD LEAD REMOVAL;  Surgeon: Sharion Settler, MD;  Location: [facility] ORS;  Service: Cardiovascular;  Laterality: N/A;   IMPLANTABLE CARDIOVERTER DEFIBRILLATOR IMPLANT     INCISION AND DRAINAGE ABSCESS N/A 12/12/2020   Procedure: INCISION AND DRAINAGE ABSCESS;  Surgeon: Leafy Ro, MD;  Location: [facility] ORS;  Service: General;  Laterality: N/A;   INSERT / REPLACE / REMOVE PACEMAKER     LEFT HEART CATHETERIZATION WITH CORONARY ANGIOGRAM N/A 12/09/2014   Procedure: LEFT HEART CATHETERIZATION WITH CORONARY ANGIOGRAM;  Surgeon: Kathleene Hazel, MD;  Location: [facility] CATH LAB;  Service: Cardiovascular;  Laterality: N/A;   RIGHT/LEFT HEART CATH AND CORONARY ANGIOGRAPHY Bilateral 12/14/2009   Procedure: RIGHT/LEFT HEART CATH AND CORONARY ANGIOGRAPHY; Location: [facility]; Surgeon: Rudean Hitt, MD   TONSILLECTOMY     as a child , adnoids removed   UPPER ESOPHAGEAL ENDOSCOPIC ULTRASOUND (EUS) N/A 11/25/2021   Procedure: UPPER ESOPHAGEAL ENDOSCOPIC ULTRASOUND (EUS);  Surgeon: Lemar Lofty., MD;  Location: [facility] ENDOSCOPY;  Service: Gastroenterology;  Laterality: N/A;   Social History:  reports that he quit smoking about 6 months ago. His smoking use included cigarettes. He has a 51 pack-year smoking history. He has been exposed to tobacco smoke. He has never used  smokeless tobacco. He reports that he does not drink alcohol and does not use drugs.  Allergies  Allergen Reactions   Ciprofloxacin Swelling and Other (See Comments)    Migraine Headache    Iodinated Contrast Media     Other reaction(s): Vomiting Projectile vomiting   Isosorb Dinitrate-Hydralazine Other (See Comments)    Migraine Headache    Family History  Problem Relation Age of Onset   Hypertension Mother    Congestive Heart Failure Mother    Hypertension Sister    Diabetes Sister    Pancreatitis Sister    COPD Sister    Emphysema Sister        smoked   Pancreatitis Brother    Anemia Neg Hx    Arrhythmia Neg Hx    Asthma Neg Hx    Clotting disorder Neg Hx    Fainting Neg Hx    Heart attack Neg Hx    Heart disease Neg Hx    Heart failure Neg Hx    Hyperlipidemia Neg Hx     Prior to Admission medications   Medication Sig Start Date End Date Taking? Authorizing Provider  allopurinol (  ZYLOPRIM) 100 MG tablet Take 100 mg by mouth daily.    [provider]  atorvastatin (LIPITOR) 40 MG tablet Take 40 mg by mouth daily.    [provider]  clindamycin (CLEOCIN-T) 1 % lotion Apply to affected areas daily after shower. 06/11/21   Willeen Niece, MD  colchicine 0.6 MG tablet Take 1 tablet (0.6 mg total) by mouth as needed for up to 1 day. Initial: 1.2mg  (2 Pills), followed by 0.6 (1 pill) every 1-2 hours; Max of 3 doses. 11/07/23 11/10/23  Concha Se, MD  ENTRESTO 24-26 MG Take 0.5 tablets by mouth 2 (two) times daily. 09/16/22   [provider]  Fluticasone-Umeclidin-Vilant (TRELEGY ELLIPTA) 100-62.5-25 MCG/ACT AEPB Inhale 1 puff into the lungs daily. 10/26/23   Salena Saner, MD  Fluticasone-Umeclidin-Vilant (TRELEGY ELLIPTA) 100-62.5-25 MCG/ACT AEPB Inhale 1 puff into the lungs daily. 11/16/23   Salena Saner, MD  ketoconazole (NIZORAL) 2 % cream Apply twice daily to neck until clear 06/11/21   Willeen Niece, MD  magnesium oxide (MAG-OX) 400  (240 Mg) MG tablet Take 1 tablet (400 mg total) by mouth daily. 06/13/23   Loyce Dys, MD  mupirocin ointment (BACTROBAN) 2 % Apply topically 3 (three) times daily. 08/13/22   [provider]  NON FORMULARY Pt uses a cpap nightly    [provider]  pantoprazole (PROTONIX) 40 MG tablet Take 40 mg by mouth daily.    [provider]  torsemide (DEMADEX) 20 MG tablet Take 40 mg by mouth daily.    [provider]    Physical Exam: Vitals:   12/01/23 0827 12/01/23 0849 12/01/23 1244  BP: (!) 124/110  120/88  Pulse: (!) 112  81  Resp: 20  20  Temp: 98.6 F (37 C)  98 F (36.7 C)  TempSrc: Oral  Oral  SpO2: 98%  98%  Weight:  (!) 140 kg   Height:  5' 5.5" (1.664 m)    Physical Exam Constitutional:      Appearance: He is obese.  HENT:     Head: Normocephalic and atraumatic.     Nose: Nose normal.     Mouth/Throat:     Mouth: Mucous membranes are moist.  Eyes:     Pupils: Pupils are equal, round, and reactive to light.  Cardiovascular:     Rate and Rhythm: Normal rate and regular rhythm.  Pulmonary:     Effort: Pulmonary effort is normal.     Breath sounds: Wheezing and rales present.  Abdominal:     General: Bowel sounds are normal.  Musculoskeletal:     Right lower leg: Edema present.     Left lower leg: Edema present.  Skin:    General: Skin is warm.  Neurological:     General: No focal deficit present.  Psychiatric:        Mood and Affect: Mood normal.     Data Reviewed:  There are no new results to review at this time.  DG Chest 2 View CLINICAL DATA:  Cough.  EXAM: CHEST - 2 VIEW  COMPARISON:  Chest radiograph dated 11/07/2023.  FINDINGS: Stable cardiomegaly and central pulmonary vascular congestion. Stable left-sided pacemaker. No evidence of overt edema. No focal consolidation, pleural effusion, or pneumothorax. No acute osseous abnormality.  IMPRESSION: Stable cardiomegaly with central pulmonary vascular  congestion.  Electronically Signed   By: Hart Robinsons M.D.   On: 12/01/2023 09:51  Last metabolic panel Lab Results  Component Value Date  GLUCOSE 170 (H) 12/01/2023   NA 134 (L) 12/01/2023   K 4.0 12/01/2023   CL 101 12/01/2023   CO2 22 12/01/2023   BUN 51 (H) 12/01/2023   CREATININE 1.61 (H) 12/01/2023   GFRNONAA 52 (L) 12/01/2023   CALCIUM 8.9 12/01/2023   PROT 7.9 06/12/2023   ALBUMIN 3.5 06/12/2023   BILITOT 0.6 06/12/2023   ALKPHOS 182 (H) 06/12/2023   AST 38 06/12/2023   ALT 32 06/12/2023   ANIONGAP 11 12/01/2023   Lab Results  Component Value Date   WBC 5.0 12/01/2023   HGB 13.2 12/01/2023   HCT 37.6 (L) 12/01/2023   MCV 92.8 12/01/2023   PLT 144 (L) 12/01/2023     Assessment and Plan: Acute on chronic respiratory failure with hypoxia (HCC) Acute on chronic HFrEF COPD exacerbation Decompensated resp status now requiring 4-5L [place] in setting of acute on chronic HFrEF, COPD Exacerbation  Baseline 3L [place] O2 use at home  2D echo February 2024 with EF of less than 20% Positive diffuse wheezing on exam Chest x-ray today with cardiomegaly and pulmonary vascular congestion BNP today 320 which is mildly above baseline. 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 Observation status is judged to be reasonable and necessary in order to provide the required intensity of service to ensure the patient's safety. The patient's presenting symptoms, physical exam findings, and initial radiographic and laboratory data in the context of their medical condition is felt to place them at decreased risk for further clinical deterioration. Furthermore, it is anticipated that the patient will be medically stable for discharge from the hospital within 2 midnights of admission.   Author: Floydene Flock, MD 12/01/2023 2:57 PM  For on call review [email or web address].

## 2023-12-01 NOTE — Assessment & Plan Note (Signed)
Cont allopurinol

## 2023-12-01 NOTE — Assessment & Plan Note (Addendum)
 Troponin 90s to 70s on presentation in the setting of decompensated respiratory failure associated with COPD exacerbation and acute on chronic HFrEF Suspect demand mismatch Continue home regimen including statin Monitor

## 2023-12-01 NOTE — Progress Notes (Signed)
 PHARMACIST - PHYSICIAN COMMUNICATION  CONCERNING:  Enoxaparin (Lovenox) for DVT Prophylaxis    RECOMMENDATION: Patient was prescribed enoxaprin 40mg  q24 hours for VTE prophylaxis.   Filed Weights   12/01/23 0849  Weight: (!) 140 kg (308 lb 10.3 oz)    Body mass index is 50.58 kg/m.  Estimated Creatinine Clearance: 74.3 mL/min (A) (by C-G formula based on SCr of 1.61 mg/dL (H)).   Based on [facility] policy patient is candidate for enoxaparin 0.5mg /kg TBW SQ every 24 hours based on BMI being >30.   DESCRIPTION: Pharmacy has adjusted enoxaparin dose per [facility] policy.  Patient is now receiving enoxaparin 70 mg every 24 hours    Barrie Folk, PharmD Clinical Pharmacist  12/01/2023 1:33 PM

## 2023-12-01 NOTE — Assessment & Plan Note (Signed)
 CPAP.

## 2023-12-01 NOTE — Assessment & Plan Note (Addendum)
 Acute on chronic HFrEF COPD exacerbation Decompensated resp status now requiring 4-5L [place] in setting of acute on chronic HFrEF, COPD Exacerbation  Baseline 3L [place] O2 use at home  2D echo February 2024 with EF of less than 20% Positive diffuse wheezing on exam Chest x-ray today with cardiomegaly and pulmonary vascular congestion BNP today 320 which is mildly above baseline. No longer smoking IV Lasix IV Solu-Medrol DuoNebs IV azithromycin Supplemental oxygen Discussed diet and lifestyle modification at length

## 2023-12-01 NOTE — Consult Note (Signed)
 [facility] CARDIOLOGY CONSULT NOTE       Patient ID: Oscar [name] MRN: [id number] DOB/AGE: 10/30/1975 48 y.o.  Admit date: 12/01/2023 Referring Physician Dr. Doree Albee Primary Physician Revelo, Presley Raddle, MD  Primary Cardiologist Dorothyann Peng, MD Reason for Consultation Acute on chronic heart failure  HPI: Oscar [name] is a 48 y.o. male  with a past medical history of chronic HFrEF, nonischemic cardiomyopathy, LBBB s/p CRT-D, OSA, severe COPD (home O2 2-3L), CKD, type 2 diabetes mellitus, hyperlipidemia, hypertension, paroxysmal atrial fibrillation who presented to the ED on 12/01/2023 for worsening shortness of breath. Cardiology was consulted for further evaluation.   Patient presented to the ED today due to worsening shortness of breath for the past 2 weeks. Workup in the ED notable for BP 124/110, HR 112, afebrile. Na 134, K 4.0, Cr 1.61 (around baseline), Hgb 13.2, platelets 144. CXR stable cardiomegaly and bilateral pulmonary congestion. EKG showed sinus rhythm, rate 78 bpm.  BNP 319.  Troponin trending flat 92 > 74.  Patient received antibiotics, Solu-Medrol, DuoNebs, 1 dose of IV Lasix 60 mg, PO KCl 40 mEq.  At the time of my evaluation, patient was laying in hospital bed at a slight incline. Patient states he's been having worsening shortness of breath for past 2 weeks. Patient reports cough, lightheadedness, dizziness, and mild chest pain intermittently.  Patient endorses slight tightness in his abdomen that has been improving.  Patient states his shortness of breath has improved since presenting to the hospital.  Patient denies any leg swelling. BP and HR have been stable.   Review of systems complete and found to be negative unless listed above    Past Medical History:  Diagnosis Date   AICD (automatic cardioverter/defibrillator) present    Aortic atherosclerosis (HCC)    Asthma    Chronic respiratory failure (HCC)    CKD (chronic kidney disease), stage  III (HCC)    COPD (chronic obstructive pulmonary disease) (HCC)    Deafness in right ear    Diabetes mellitus without complication (HCC)    Dyspnea    GERD (gastroesophageal reflux disease)    Gout    HFrEF (heart failure with reduced ejection fraction) (HCC)    a.) TTE 12/10/14: EF 25%; diff inf HK; sev LV and mod LA dil; LVH. b.) TTE 06/23/18: EF 20-25%; LVH; mild LV dil, mild BAE. c.) TTE 03/01/19: EF 15%, LVH, BAE; triv PR/TR. d.) TTE 02/27/20: EF < 20%; sev LV dil; mild MR. e.) TTE 11/23/20: EF < 20%; glob HK; sev LV dil; LVH; mild-mod BAE; mod-sev TR, triv AR; G1DD. f.) TTE 05/14/21: EF <15%; LVH; sev LA and mild RV enlar; triv AR/PR, mild TR, mod MR.   Hiatal hernia    History of cardiac catheterization    a.) R/LHC 12/14/2009: normal coronaries. b.) R/LHC 12/11/2014: normal coronaries.   Hyperlipidemia    Hypertension    Hypoxemia    LBBB (left bundle branch block)    NICM (nonischemic cardiomyopathy; dilated cardiomyopathy) (HCC)    a.) R/LHC 12/14/2009: normal cors; LVEDP 18 mmHg, mean PA 29 mmHg, mean PCWP 31 mmHg; CO 6 L/min; CI 2.54 L/min/m. b.) TTE 12/10/2014: EF 25%. c.) R/LHC 12/11/2014: mean RA 9 mmHg, mean PA 22 mmHg, mean PCWP 22 mmHg. d.) TTE 06/23/2018: EF 20-25%. e.) TTE 03/01/2019: EF 15%. f.) TTE 02/27/2020: < 20%. g.) TTE 11/23/2020: EF <20%. h.) TTE 05/14/2021: < 15%.   NSTEMI (non-ST elevated myocardial infarction) (HCC)    a.) x 3 per  patient report ---> 2012, 2014, 2016   Obesity    On supplemental oxygen by nasal cannula    a.) 2-3 L/[place] PRN   OSA on CPAP    PAF (paroxysmal atrial fibrillation) (HCC)    a.) CHA2DS2-VASc = 4 (HFrEF, HTN, prior MI, T2DM). b.) rate/rhythm maintained without pharmacologial intervention; no current anticoagulation.   Pancreatitis    PSVT (paroxysmal supraventricular tachycardia) [facility])     Past Surgical History:  Procedure Laterality Date   BIOPSY  11/25/2021   Procedure: BIOPSY;  Surgeon: Meridee Score Netty Starring., MD;  Location: [facility]  ENDOSCOPY;  Service: Gastroenterology;;   CARDIAC CATHETERIZATION  12/11/2014   Procedure: RIGHT/LEFT HEART CATH AND CORONARY ANGIOGRAPHY;  Surgeon: Runell Gess, MD;  Location[facility] CATH LAB;  Service: Cardiovascular;;   COLONOSCOPY WITH PROPOFOL N/A 11/13/2021   Procedure: COLONOSCOPY WITH PROPOFOL;  Surgeon: Toney Reil, MD;  Location: [facility] ENDOSCOPY;  Service: Gastroenterology;  Laterality: N/A;   ESOPHAGOGASTRODUODENOSCOPY (EGD) WITH PROPOFOL N/A 11/25/2021   Procedure: ESOPHAGOGASTRODUODENOSCOPY (EGD) WITH PROPOFOL;  Surgeon: Meridee Score Netty Starring., MD;  Location: [facility] ENDOSCOPY;  Service: Gastroenterology;  Laterality: N/A;   ICD LEAD REMOVAL N/A 03/30/2015   Procedure: ICD LEAD REMOVAL;  Surgeon: Sharion Settler, MD;  Location: [facility] ORS;  Service: Cardiovascular;  Laterality: N/A;   IMPLANTABLE CARDIOVERTER DEFIBRILLATOR IMPLANT     INCISION AND DRAINAGE ABSCESS N/A 12/12/2020   Procedure: INCISION AND DRAINAGE ABSCESS;  Surgeon: Leafy Ro, MD;  Location: [facility] ORS;  Service: General;  Laterality: N/A;   INSERT / REPLACE / REMOVE PACEMAKER     LEFT HEART CATHETERIZATION WITH CORONARY ANGIOGRAM N/A 12/09/2014   Procedure: LEFT HEART CATHETERIZATION WITH CORONARY ANGIOGRAM;  Surgeon: Kathleene Hazel, MD;  Location: [facility] CATH LAB;  Service: Cardiovascular;  Laterality: N/A;   RIGHT/LEFT HEART CATH AND CORONARY ANGIOGRAPHY Bilateral 12/14/2009   Procedure: RIGHT/LEFT HEART CATH AND CORONARY ANGIOGRAPHY; Location: [facility]; Surgeon: Rudean Hitt, MD   TONSILLECTOMY     as a child , adnoids removed   UPPER ESOPHAGEAL ENDOSCOPIC ULTRASOUND (EUS) N/A 11/25/2021   Procedure: UPPER ESOPHAGEAL ENDOSCOPIC ULTRASOUND (EUS);  Surgeon: Lemar Lofty., MD;  Location: [facility] ENDOSCOPY;  Service: Gastroenterology;  Laterality: N/A;    (Not in a hospital admission)  Social History   Socioeconomic History   Marital status: Single    Spouse name: Not on file   Number of  children: Not on file   Years of education: Not on file   Highest education level: Not on file  Occupational History   Occupation: unemployed  Tobacco Use   Smoking status: Former    Current packs/day: 0.00    Average packs/day: 1.5 packs/day for 34.0 years (51.0 ttl pk-yrs)    Types: Cigarettes    Quit date: 05/2023    Years since quitting: 0.5    Passive exposure: Past   Smokeless tobacco: Never   Tobacco comments:    Quit smoking 05/2023  Vaping Use   Vaping status: Never Used  Substance and Sexual Activity   Alcohol use: No   Drug use: No   Sexual activity: Yes  Other Topics Concern   Not on file  Social History Narrative   Live alone   Social Drivers of Health   Financial Resource Strain: Low Risk  (11/23/2023)   Received from [facility]   Overall Financial Resource Strain (CARDIA)    Difficulty of Paying Living Expenses: Not very hard  Food Insecurity: Food Insecurity Present (11/23/2023)  [facility]   Hunger Vital Sign    Worried About Running Out of Food in the Last Year: Sometimes true    Ran Out of Food in the Last Year: Sometimes true  Transportation Needs: No Transportation Needs (11/23/2023)   Received from [facility]    In the past 12 months, has lack of transportation kept you from medical appointments or from getting medications?: No    Lack of Transportation (Non-Medical): No  Physical Activity: Not on file  Stress: Not on file  Social Connections: Not on file  Intimate Partner Violence: Not At Risk (06/13/2023)   Humiliation, Afraid, Rape, and Kick questionnaire    Fear of Current or Ex-Partner: No    Emotionally Abused: No    Physically Abused: No    Sexually Abused: No    Family History  Problem Relation Age of Onset   Hypertension Mother    Congestive Heart Failure Mother    Hypertension Sister    Diabetes Sister    Pancreatitis Sister    COPD  Sister    Emphysema Sister        smoked   Pancreatitis Brother    Anemia Neg Hx    Arrhythmia Neg Hx    Asthma Neg Hx    Clotting disorder Neg Hx    Fainting Neg Hx    Heart attack Neg Hx    Heart disease Neg Hx    Heart failure Neg Hx    Hyperlipidemia Neg Hx      Vitals:   12/01/23 0827 12/01/23 0849 12/01/23 1244  BP: (!) 124/110  120/88  Pulse: (!) 112  81  Resp: 20  20  Temp: 98.6 F (37 C)  98 F (36.7 C)  TempSrc: Oral  Oral  SpO2: 98%  98%  Weight:  (!) 140 kg   Height:  5' 5.5" (1.664 m)     PHYSICAL EXAM General: Chronically ill-appearing male, well nourished, in no acute distress. HEENT: Normocephalic and atraumatic. Neck: No JVD.  Lungs: Normal respiratory effort on 4L O2 [place]. Rhonchi bilaterally.  Heart: HRRR. Normal S1 and S2 without gallops or murmurs.  Abdomen: Non-distended appearing.  Msk: Normal strength and tone for age. Extremities: Warm and well perfused. No clubbing, cyanosis. No edema.  Neuro: Alert and oriented X 3. Psych: Answers questions appropriately.   Labs: Basic Metabolic Panel: Recent Labs    12/01/23 0831  NA 134*  K 4.0  CL 101  CO2 22  GLUCOSE 170*  BUN 51*  CREATININE 1.61*  CALCIUM 8.9   Liver Function Tests: No results for input(s): "AST", "ALT", "ALKPHOS", "BILITOT", "PROT", "ALBUMIN" in the last 72 hours. No results for input(s): "LIPASE", "AMYLASE" in the last 72 hours. CBC: Recent Labs    12/01/23 0831  WBC 5.0  HGB 13.2  HCT 37.6*  MCV 92.8  PLT 144*   Cardiac Enzymes: Recent Labs    12/01/23 0831 12/01/23 1310  TROPONINIHS 92* 74*   BNP: Recent Labs    12/01/23 0840  BNP 319.6*   D-Dimer: No results for input(s): "DDIMER" in the last 72 hours. Hemoglobin A1C: No results for input(s): "HGBA1C" in the last 72 hours. Fasting Lipid Panel: No results for input(s): "CHOL", "HDL", "LDLCALC", "TRIG", "CHOLHDL", "LDLDIRECT" in the last 72 hours. Thyroid Function Tests: No results for input(s):  "TSH", "T4TOTAL", "T3FREE", "THYROIDAB" in the last 72 hours.  Invalid input(s): "FREET3" Anemia Panel: No results  for input(s): "VITAMINB12", "FOLATE", "FERRITIN", "TIBC", "IRON", "RETICCTPCT" in the last 72 hours.   Radiology: DG Chest 2 View Result Date: 12/01/2023 CLINICAL DATA:  Cough. EXAM: CHEST - 2 VIEW COMPARISON:  Chest radiograph dated 11/07/2023. FINDINGS: Stable cardiomegaly and central pulmonary vascular congestion. Stable left-sided pacemaker. No evidence of overt edema. No focal consolidation, pleural effusion, or pneumothorax. No acute osseous abnormality. IMPRESSION: Stable cardiomegaly with central pulmonary vascular congestion. Electronically Signed   By: Hart Robinsons M.D.   On: 12/01/2023 09:51   DG Elbow Complete Right Result Date: 11/07/2023 CLINICAL DATA:  pain EXAM: RIGHT ELBOW - COMPLETE 3+ VIEW COMPARISON:  None Available. FINDINGS: No acute fracture or dislocation. Joint spaces and alignment are maintained. No area of erosion or osseous destruction. No unexpected radiopaque foreign body. Soft tissues are unremarkable. IMPRESSION: No acute fracture or dislocation. Electronically Signed   By: Meda Klinefelter M.D.   On: 11/07/2023 16:31   DG Chest 2 View Result Date: 11/07/2023 CLINICAL DATA:  Chest pain for 3 days. EXAM: CHEST - 2 VIEW COMPARISON:  June 12, 2023. FINDINGS: Stable cardiomegaly. Left-sided defibrillator is unchanged. Both lungs are clear. The visualized skeletal structures are unremarkable. IMPRESSION: No active cardiopulmonary disease. Electronically Signed   By: Lupita Raider M.D.   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 male  with a past medical history of chronic HFrEF, nonischemic cardiomyopathy, LBBB s/p CRT-D, OSA, severe COPD (home O2 2-3L), CKD, type 2 diabetes mellitus, hyperlipidemia, hypertension, paroxysmal atrial fibrillation who presented to the ED on 12/01/2023 for worsening shortness of breath. Cardiology was consulted for further evaluation.   # Acute on chronic HFrEF (EF < 20% 10/20/2022) # Nonischemic Cardiomyopathy, LBBB s/p CRT-D  # COPD exacerbation Patient reports worsening SOB for the past 2 weeks, endorses cough, and denies any lower extremity swelling.  Patient reports that his shortness of breath has improved since presenting to the ED. BNP 319. Troponin trending flat 92 > 74. Patient on 4L O2 [place] with baseline at 2-3L at home. S/p 1 dose of IV lasix in the ED. -Echo ordered -Start IV Lasix 60 mg BID, monitor renal function and UOP closely. -Likely plan to resume home Entresto tomorrow. Plan to optimize other GDMT throughout admisison.  -Manage COPD per primary. -Continue atorvastatin 40 mg daily -Mildly elevated and flat trending troponin likely demand in the setting of acute heart failure and COPD exacerbation. No plan for further cardiac invasive evaluation.   # Paroxysmal atrial fibrillation -Will start BB as heart failure improves.  -Resume eliquis 5 mg twice daily for stroke risk reduction.   This patient's plan of care was discussed and created with Dr. Darrold Junker and he is in agreement.  Signed: Gale Journey, PA-C  12/01/2023, 3:29 PM [facility]

## 2023-12-01 NOTE — ED Provider Notes (Signed)
 [facility] Provider Note    Event Date/Time   First MD Initiated Contact with Patient 12/01/23 [phone]     (approximate)   History   Cough   HPI  Oscar [name] is a 48 y.o. male who presents to the ED for evaluation of Cough   I reviewed the CHF clinic visit from about 3 weeks ago.  Morbidly obese patient with history of OSA, ACS, COPD, CKD, ventricular tachycardia, left bundle and CHF with CRT-D.  Anticoagulated on Xarelto.  Patient presents with subacutely worsening shortness of breath on exertion, orthopnea and minimally productive cough.   Physical Exam   Triage Vital Signs: ED Triage Vitals  Encounter Vitals Group     BP 12/01/23 0827 (!) 124/110     Systolic BP Percentile --      Diastolic BP Percentile --      Pulse Rate 12/01/23 0827 (!) 112     Resp 12/01/23 0827 20     Temp 12/01/23 0827 98.6 F (37 C)     Temp Source 12/01/23 0827 Oral     SpO2 12/01/23 0827 98 %     Weight --      Height --      Head Circumference --      Peak Flow --      Pain Score 12/01/23 0828 0     Pain Loc --      Pain Education --      Exclude from Growth Chart --     Most recent vital signs: Vitals:   12/01/23 0827  BP: (!) 124/110  Pulse: (!) 112  Resp: 20  Temp: 98.6 F (37 C)  SpO2: 98%    General: Awake, no distress.  Sitting upright in a wheelchair and seems uncomfortable, on supplemental oxygen CV:  Good peripheral perfusion.  Resp:  Minimal tachypnea to the low 20s, no distress.  Wheezing throughout.  Bibasilar crackles are present. Abd:  No distention.  Soft MSK:  No deformity noted.  Edematous lower extremities Neuro:  No focal deficits appreciated. Other:     ED Results / Procedures / Treatments   Labs (all labs ordered are listed, but only abnormal results are displayed) Labs Reviewed  CBC - Abnormal; Notable for the following components:      Result Value   RBC 4.05 (*)    HCT 37.6 (*)    Platelets 144 (*)    All  other components within normal limits  BASIC METABOLIC PANEL WITH GFR - Abnormal; Notable for the following components:   Sodium 134 (*)    Glucose, Bld 170 (*)    BUN 51 (*)    Creatinine, Ser 1.61 (*)    GFR, Estimated 52 (*)    All other components within normal limits  BRAIN NATRIURETIC PEPTIDE - Abnormal; Notable for the following components:   B Natriuretic Peptide 319.6 (*)    All other components within normal limits  TROPONIN I (HIGH SENSITIVITY) - Abnormal; Notable for the following components:   Troponin I (High Sensitivity) 92 (*)    All other components within normal limits  TROPONIN I (HIGH SENSITIVITY)    EKG Sinus rhythm with a rate of 78 bpm.  Left bundle morphology.  No STEMI by Sgarbossa criteria.  Nonspecific changes Wandering baseline  RADIOLOGY 2 view CXR interpreted by me with cardiomegaly and mild pulmonary vascular congestion  Official radiology report(s): DG Chest 2 View Result Date: 12/01/2023 CLINICAL DATA:  Cough. EXAM:  CHEST - 2 VIEW COMPARISON:  Chest radiograph dated 11/07/2023. FINDINGS: Stable cardiomegaly and central pulmonary vascular congestion. Stable left-sided pacemaker. No evidence of overt edema. No focal consolidation, pleural effusion, or pneumothorax. No acute osseous abnormality. IMPRESSION: Stable cardiomegaly with central pulmonary vascular congestion. Electronically Signed   By: Hart Robinsons M.D.   On: 12/01/2023 09:51    PROCEDURES and INTERVENTIONS:  .1-3 Lead EKG Interpretation  Performed by: Delton Prairie, MD Authorized by: Delton Prairie, MD     Interpretation: abnormal     ECG rate:  110   ECG rate assessment: tachycardic     Rhythm: sinus tachycardia     Ectopy: none     Conduction: normal     Medications  methylPREDNISolone sodium succinate (SOLU-MEDROL) 125 mg/2 mL injection 125 mg (125 mg Intravenous Given 12/01/23 0941)  ipratropium-albuterol (DUONEB) 0.5-2.5 (3) MG/3ML nebulizer solution 3 mL (3 mLs Nebulization  Given 12/01/23 0943)  furosemide (LASIX) injection 60 mg (60 mg Intravenous Given 12/01/23 0944)  potassium chloride SA (KLOR-CON M) CR tablet 40 mEq (40 mEq Oral Given 12/01/23 0947)     IMPRESSION / MDM / ASSESSMENT AND PLAN / ED COURSE  I reviewed the triage vital signs and the nursing notes.  Differential diagnosis includes, but is not limited to, ACS, PTX, PNA, muscle strain/spasm, PE, dissection, anxiety, pleural effusion  {Patient presents with symptoms of an acute illness or injury that is potentially life-threatening.  Patient presents with evidence of a COPD and CHF exacerbation requiring medical admission.  He is not hypoxic but symptomatic and uncomfortable.  Congested CXR and elevated BNP.  Wheezing throughout and signs of COPD.  Renal dysfunction near baseline.  No signs of sepsis or pneumonia.  Started on diuretics and breathing treatments alongside steroids.  Clinical Course as of 12/01/23 1022  Tue Dec 01, 2023  1006 Consult with medicine who agrees to admit [DS]    Clinical Course User Index [DS] Delton Prairie, MD     FINAL CLINICAL IMPRESSION(S) / ED DIAGNOSES   Final diagnoses:  Acute on chronic combined systolic and diastolic congestive heart failure (HCC)  COPD exacerbation (HCC)     Rx / DC Orders   ED Discharge Orders     None        Note:  This document was prepared using Dragon voice recognition software and may include unintentional dictation errors.   Delton Prairie, MD 12/01/23 1022

## 2023-12-01 NOTE — Evaluation (Signed)
 Physical Therapy Evaluation Patient Details Name: Oscar [name] MRN: [id number] DOB: [birth year] Today's Date: 12/01/2023  History of Present Illness  Oscar [name] is a 48 y.o. male  with a past medical history of chronic HFrEF, nonischemic cardiomyopathy, LBBB s/p CRT-D, OSA, severe COPD (home O2 2-3L), CKD, type 2 diabetes mellitus, hyperlipidemia, hypertension, paroxysmal atrial fibrillation who presented to the ED on 12/01/2023 for worsening shortness of breath.    Clinical Impression  Pt received in Semi-Fowler's position and agreeable to therapy.  Pt performed transfers well without any physical assist or cuing necessary.  Pt able to done slipped and transfer into standing position before ambulating in the hallway.  Pt did need supplemental O2 and remained >90% throughout the ambulation attempt.  Pt returned to the bed after two laps in the ED.  Patient is at baseline, all education completed, and time is given to address all questions/concerns. No additional skilled PT services needed at this time, PT signing off. PT recommends daily ambulation ad lib or with nursing staff as needed to prevent deconditioning.          If plan is discharge home, recommend the following:     Can travel by private vehicle        Equipment Recommendations None recommended by PT  Recommendations for Other Services       Functional Status Assessment Patient has not had a recent decline in their functional status     Precautions / Restrictions Precautions Precautions: None      Mobility  Bed Mobility Overal bed mobility: Modified Independent                  Transfers Overall transfer level: Modified independent                      Ambulation/Gait Ambulation/Gait assistance: Modified independent (Device/Increase time) Gait Distance (Feet): 220 Feet Assistive device: None Gait Pattern/deviations: Step-through pattern Gait velocity: good speed     General Gait  Details: Pt with good gait mechanics and no SOB noted when ambulating with therapist.  Stairs            Wheelchair Mobility     Tilt Bed    Modified Rankin (Stroke Patients Only)       Balance Overall balance assessment: Modified Independent                                           Pertinent Vitals/Pain Pain Assessment Pain Assessment: Faces Faces Pain Scale: Hurts a little bit Pain Location: R elbow due to gout flare up. Pain Descriptors / Indicators: Aching Pain Intervention(s): Monitored during session, Repositioned    Home Living Family/patient expects to be discharged to:: Private residence Living Arrangements: Spouse/significant other Available Help at Discharge: Personal care attendant;Available PRN/intermittently Type of Home: House Home Access: Ramped entrance       Home Layout: One level Home Equipment: Cane - quad;Cane - single point Additional Comments: Pt has PCA available 2 hrs/day that assists with cleaning, but needs someone to assist with reminding him to take his medications.    Prior Function Prior Level of Function : Independent/Modified Independent             Mobility Comments: Pt only utilizes cane when having gout flare up in the LE's.       Extremity/Trunk Assessment   Upper  Extremity Assessment Upper Extremity Assessment: Overall WFL for tasks assessed    Lower Extremity Assessment Lower Extremity Assessment: Overall WFL for tasks assessed       Communication   Communication Communication: No apparent difficulties    Cognition Arousal: Alert Behavior During Therapy: WFL for tasks assessed/performed   PT - Cognitive impairments: No apparent impairments                                 Cueing       General Comments      Exercises     Assessment/Plan    PT Assessment Patient does not need any further PT services  PT Problem List         PT Treatment Interventions       PT Goals (Current goals can be found in the Care Plan section)  Acute Rehab PT Goals PT Goal Formulation: All assessment and education complete, DC therapy    Frequency       Co-evaluation               AM-PAC PT "6 Clicks" Mobility  Outcome Measure Help needed turning from your back to your side while in a flat bed without using bedrails?: None Help needed moving from lying on your back to sitting on the side of a flat bed without using bedrails?: None Help needed moving to and from a bed to a chair (including a wheelchair)?: None Help needed standing up from a chair using your arms (e.g., wheelchair or bedside chair)?: None Help needed to walk in hospital room?: None Help needed climbing 3-5 steps with a railing? : None 6 Click Score: 24    End of Session Equipment Utilized During Treatment: Oxygen Activity Tolerance: Patient tolerated treatment well Patient left: in bed;with call bell/phone within reach Nurse Communication: Mobility status PT Visit Diagnosis: Muscle weakness (generalized) (M62.81)    Time: 3474-2595 PT Time Calculation (min) (ACUTE ONLY): 17 min   Charges:   PT Evaluation $PT Eval Low Complexity: 1 Low   PT General Charges $$ ACUTE PT VISIT: 1 Visit         Nolon Bussing, PT, DPT Physical Therapist - [facility]  [facility]  12/01/23, 4:16 PM

## 2023-12-01 NOTE — Assessment & Plan Note (Signed)
 Blood sugars 170s  SSI  A1C

## 2023-12-02 ENCOUNTER — Other Ambulatory Visit (HOSPITAL_COMMUNITY): Payer: Self-pay

## 2023-12-02 ENCOUNTER — Telehealth (HOSPITAL_COMMUNITY): Payer: Self-pay | Admitting: Pharmacy Technician

## 2023-12-02 ENCOUNTER — Encounter: Payer: Self-pay | Admitting: Family Medicine

## 2023-12-02 DIAGNOSIS — R0602 Shortness of breath: Secondary | ICD-10-CM | POA: Diagnosis not present

## 2023-12-02 DIAGNOSIS — G4733 Obstructive sleep apnea (adult) (pediatric): Secondary | ICD-10-CM | POA: Diagnosis not present

## 2023-12-02 DIAGNOSIS — U071 COVID-19: Secondary | ICD-10-CM | POA: Diagnosis not present

## 2023-12-02 DIAGNOSIS — N1831 Chronic kidney disease, stage 3a: Secondary | ICD-10-CM | POA: Diagnosis not present

## 2023-12-02 DIAGNOSIS — J9621 Acute and chronic respiratory failure with hypoxia: Secondary | ICD-10-CM | POA: Diagnosis not present

## 2023-12-02 LAB — CBC
HCT: 36.1 % — ABNORMAL LOW (ref 39.0–52.0)
Hemoglobin: 12.8 g/dL — ABNORMAL LOW (ref 13.0–17.0)
MCH: 32.2 pg (ref 26.0–34.0)
MCHC: 35.5 g/dL (ref 30.0–36.0)
MCV: 90.7 fL (ref 80.0–100.0)
Platelets: 141 10*3/uL — ABNORMAL LOW (ref 150–400)
RBC: 3.98 MIL/uL — ABNORMAL LOW (ref 4.22–5.81)
RDW: 11.9 % (ref 11.5–15.5)
WBC: 6.2 10*3/uL (ref 4.0–10.5)
nRBC: 0 % (ref 0.0–0.2)

## 2023-12-02 LAB — ECHOCARDIOGRAM COMPLETE
Area-P 1/2: 4.18 cm2
Height: 65.5 in
S' Lateral: 6.8 cm
Weight: 4938.3 [oz_av]

## 2023-12-02 LAB — GLUCOSE, CAPILLARY
Glucose-Capillary: 294 mg/dL — ABNORMAL HIGH (ref 70–99)
Glucose-Capillary: 309 mg/dL — ABNORMAL HIGH (ref 70–99)
Glucose-Capillary: 313 mg/dL — ABNORMAL HIGH (ref 70–99)

## 2023-12-02 LAB — COMPREHENSIVE METABOLIC PANEL WITH GFR
ALT: 34 U/L (ref 0–44)
AST: 33 U/L (ref 15–41)
Albumin: 3.3 g/dL — ABNORMAL LOW (ref 3.5–5.0)
Alkaline Phosphatase: 176 U/L — ABNORMAL HIGH (ref 38–126)
Anion gap: 11 (ref 5–15)
BUN: 57 mg/dL — ABNORMAL HIGH (ref 6–20)
CO2: 22 mmol/L (ref 22–32)
Calcium: 8.9 mg/dL (ref 8.9–10.3)
Chloride: 99 mmol/L (ref 98–111)
Creatinine, Ser: 1.87 mg/dL — ABNORMAL HIGH (ref 0.61–1.24)
GFR, Estimated: 44 mL/min — ABNORMAL LOW (ref >60)
Glucose, Bld: 374 mg/dL — ABNORMAL HIGH (ref 70–99)
Potassium: 4.1 mmol/L (ref 3.5–5.1)
Sodium: 132 mmol/L — ABNORMAL LOW (ref 135–145)
Total Bilirubin: 0.6 mg/dL (ref 0.0–1.2)
Total Protein: 8.3 g/dL — ABNORMAL HIGH (ref 6.5–8.1)

## 2023-12-02 LAB — CBG MONITORING, ED
Glucose-Capillary: 346 mg/dL — ABNORMAL HIGH (ref 70–99)
Glucose-Capillary: 311 mg/dL — ABNORMAL HIGH (ref 70–99)

## 2023-12-02 MED ORDER — SACUBITRIL-VALSARTAN 24-26 MG PO TABS
1.0000 | ORAL_TABLET | Freq: Two times a day (BID) | ORAL | Status: DC
  Administered 2023-12-02 – 2023-12-03 (×3): 1 via ORAL
  Filled 2023-12-02 (×3): qty 1

## 2023-12-02 MED ORDER — INSULIN ASPART 100 UNIT/ML IJ SOLN
10.0000 [IU] | Freq: Three times a day (TID) | INTRAMUSCULAR | Status: DC
  Administered 2023-12-02 – 2023-12-03 (×2): 10 [IU] via SUBCUTANEOUS
  Filled 2023-12-02 (×2): qty 1

## 2023-12-02 MED ORDER — INSULIN GLARGINE-YFGN 100 UNIT/ML ~~LOC~~ SOLN
6.0000 [IU] | Freq: Every day | SUBCUTANEOUS | Status: DC
  Administered 2023-12-02: 6 [IU] via SUBCUTANEOUS
  Filled 2023-12-02 (×2): qty 0.06

## 2023-12-02 NOTE — Care Management Obs Status (Signed)
 MEDICARE OBSERVATION STATUS NOTIFICATION   Patient Details  Name: Oscar [name] MRN: [id number] Date of Birth: [birth year]   Medicare Observation Status Notification Given:  Orland Dec, CMA 12/02/2023, 10:22 AM

## 2023-12-02 NOTE — Progress Notes (Addendum)
 Progress Note   Patient: Oscar [name] JWJ:[id number] DOB: Dec 17, 1975 DOA: 12/01/2023     0 DOS: the patient was seen and examined on 12/02/2023   Brief hospital course: " Oscar [name] is a 48 y.o. male with medical history significant of HFrEF EF less than 20% secondary to nonischemic dilated cardiomyopathy, type 2 diabetes, CKD stage IIIa, chronic hypoxic respiratory failure on as needed 3 L of supplemental oxygen, paroxysmal atrial fibrillation not on AC, OSA on CPAP, hypertension, hyperlipidemia, gout, COPD presented with acute on chronic respiratory failure with hypoxia, acute on chronic HFrEF, COPD exacerbation.  " See H&P for full HPI on admission & ED course.  Patient was admitted for further evaluation and management. Started on IV Lasix for diuresis, IV steroids for COPD exacerbation. Cardiology was consulted.  Further hospital course and management as outlined below.   Assessment and Plan:  Acute on chronic respiratory failure with hypoxia (HCC) Acute on chronic HFrEF COPD exacerbation Decompensated resp status requiring 4-5L O2 on admission in setting of acute on chronic HFrEF, COPD Exacerbation with diffuse wheezing on exam. Baseline 3L [place] O2 use at home  2D echo February 2024 with EF of less than 20% --Cardiology stopped IV Lasix with Cr bump today --Likely PO diuretic tomorrow --Oscar [name] resumed  --Other GDMT per Cardiology --Continue IV Solu-Medrol, DuoNebs,  Zithromax --Supplemental oxygen, wean as tolerated, target sat >90%  Myocardial injury - troponin's elevated due to demand ischemia Troponin 90s to 70s on presentation in the setting of decompensated respiratory failure associated with COPD exacerbation and acute on chronic HFrEF. 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  He reports feeling better and asking if he can go home today.  No other complaints   Physical Exam: Vitals:   12/02/23 1130 12/02/23 1154 12/02/23 1200 12/02/23 1509  BP: 116/71  138/75 (!) 119/98  Pulse: 85  72 80  Resp: 17  11 17   Temp:  97.7 F (36.5 C)  97.8 F (36.6 C)  TempSrc:  Oral  Oral  SpO2: 98%  96% 98%  Weight:      Height:       General exam: awake, alert, no acute distress, morbidly obese HEENT: wearing CPAP mask, moist mucus membranes, hearing grossly normal  Respiratory system: CTAB diminished due to body habitus, + expiratory wheezes diffusely, normal respiratory effort at rest. Cardiovascular system: normal S1/S2, RRR, no pedal edema.   Gastrointestinal system: soft, NT, ND, no HSM felt, +bowel sounds. Central nervous system: A&O x3. no gross focal neurologic deficits, normal speech Extremities: moves all, no edema, normal tone Skin: dry, intact, normal temperature Psychiatry: normal mood, congruent affect   Data Reviewed:  Notable labs --  Na 132 Glucose 374 BUN 57 Cr 1.87 Alk phos 176 Albumin 3.3 Hbg 12.8  Family Communication: none, pt updated in detail  Disposition: Status is: Observation Remains admitted until cleared by  cardiology. On IV steroids, monitoring renal function with diuresis   Planned Discharge Destination: Home    Time spent: 42 minutes  Author: Pennie Banter, DO 12/02/2023 4:43 PM  For on call review [email or web address].

## 2023-12-02 NOTE — Progress Notes (Signed)
 Education Assessment and Provision:  Detailed education and instructions provided on heart failure disease management including the following:  Signs and symptoms of Heart Failure When to call the physician Importance of daily weights Low sodium diet Fluid restriction Medication management Anticipated future follow-up appointments  Patient education given on each of the above topics.  Patient acknowledges understanding via teach back method and acceptance of all instructions.  Education Materials:  "Living Better With Heart Failure" Booklet, HF zone tool, & Daily Weight Tracker Tool.  Patient has scale at home: Yes Patient has pill box at home: Yes    Hospital Follow-Up scheduled with the [facility] on 12/09/23 @ 8:30 AM.  Patient arranged transportation already to appointment.  Navigator will sign off at this time.  Roxy Horseman, RN, BSN [facility] Heart Failure Navigator Secure Chat Only

## 2023-12-02 NOTE — Progress Notes (Signed)
 OT Cancellation Note  Patient Details Name: Oscar [name] MRN: [id number] DOB: Dec 23, 1975   Cancelled Treatment:    Reason Eval/Treat Not Completed: OT screened, no needs identified, will sign off. OT arrived to patient's room and he reports being back at baseline for mobility and self care needs. Per pt evaluation, pt ambulating 220' without AD at mod I level on 4/1. OT to complete orders at this time.   Jackquline Denmark, MS, OTR/L , CBIS ascom [phone]  12/02/23, 11:02 AM

## 2023-12-02 NOTE — Telephone Encounter (Addendum)
 Patient Product/process development scientist completed.    The patient is insured through [facility]. Patient has Medicare and is not eligible for a copay card, but may be able to apply for patient assistance or Medicare RX Payment Plan (Patient Must reach out to their plan, if eligible for payment plan), if available.    Ran test claim for Eliquis 5 mg and the current 30 day co-pay is $0.00.  Ran test claim for Farxiga 10 mg and the current 30 day co-pay is $0.00.  Ran test claim for Jardiance 10 mg and the current 30 day co-pay is $0.00.  This test claim was processed through [facility]- copay amounts may vary at other pharmacies due to pharmacy/plan contracts, or as the patient moves through the different stages of their insurance plan.     Roland Earl, CPHT Pharmacy Technician III Certified Patient Advocate [facility] Patient Advocate Team Direct Number: [phone]  Fax: [phone]

## 2023-12-02 NOTE — ED Notes (Signed)
 Assumed patient care and received report from the previous nurse.

## 2023-12-02 NOTE — Progress Notes (Signed)
 Heart Failure Stewardship Pharmacy Note  PCP: Preston Fleeting, MD PCP-Cardiologist: None  HPI: Oscar [name] is a 48 y.o. 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 On admission, BNP was 319.6, HS-troponin was 92 > 74. Chest x-ray noted central pulmonary vascular congestion.   Pertinent cardiac history: LHC in 2011 and 2016 with normal coronaries. Echo in 12/2014 showed LVEF of 25%. CRT-D placed in 2016. Echo in 06/2018 showed LVEF 20-25%. Echo 01/2020 with LVEF further reduced to <20%. Echo 10/2020 with LVEF unchanged and mdoerately reduced RV function. Echo 10/2022 with LVEF unchanged, normal RV function, mild MR, mild AR.  Pertinent Lab Values: Creatinine  Date Value Ref Range Status  08/22/2014 1.08 0.60 - 1.30 mg/dL Final   Creatinine, Ser  Date Value Ref Range Status  12/02/2023 1.87 (H) 0.61 - 1.24 mg/dL Final   BUN  Date Value Ref Range Status  12/02/2023 57 (H) 6 - 20 mg/dL Final  82/95/6213 11 4 - 21 mg/dL Final  08/65/7846 21 (H) 7 - 18 mg/dL Final   Potassium  Date Value Ref Range Status  12/02/2023 4.1 3.5 - 5.1 mmol/L Final  08/22/2014 4.1 3.5 - 5.1 mmol/L Final   Sodium  Date Value Ref Range Status  12/02/2023 132 (L) 135 - 145 mmol/L Final  10/18/2015 140 137 - 147 mmol/L Final  08/22/2014 135 (L) 136 - 145 mmol/L Final   B Natriuretic Peptide  Date Value Ref Range Status  12/01/2023 319.6 (H) 0.0 - 100.0 pg/mL Final    Comment:    Performed at [facility], [address] [place], [place] [address]   Magnesium  Date Value Ref Range Status  10/26/2023 2.1 1.7 - 2.4 mg/dL Final    Comment:    Performed at [facility], [address] [place], [place] [address]  02/07/2012 1.7 (L) mg/dL Final    Comment:     2.4-4.0 THERAPEUTIC RANGE: 4-7 mg/dL TOXIC: > 10 mg/dL  -----------------------    Hemoglobin A1C  Date Value Ref Range Status  10/18/2015 7.5  Final   Hgb A1c MFr Bld  Date Value Ref Range Status  06/13/2023 7.3 (H) 4.8 - 5.6 % Final    Comment:    (NOTE) Pre diabetes:          5.7%-6.4%  Diabetes:              >6.4%  Glycemic control for   <7.0% adults with diabetes    TSH  Date Value Ref Range Status  11/24/2020 2.250 0.350 - 4.500 uIU/mL Final    Comment:    Performed by a 3rd Generation assay with a functional sensitivity of <=0.01 uIU/mL. Performed at [facility], [address] [place], [place] [address]   12/10/2014 0.648 0.350 - 4.500 uIU/mL Final    Vital Signs: Admission weight: 308.64 lbs Temp:  [97.6 F (36.4 C)-98.7 F (37.1 C)] 97.6 F (36.4 C) (04/02 0750) Pulse Rate:  [64-88] 70 (04/02 0730) Cardiac Rhythm: Normal sinus rhythm (04/01 1632) Resp:  [11-28] 18 (04/02 0600) BP: (103-133)/(58-108) 107/63 (04/02 0730) SpO2:  [93 %-99 %] 94 % (04/02 0730) FiO2 (%):  [32 %] 32 % (04/01 1632) Weight:  [140 kg (308 lb 10.3 oz)] 140 kg (308 lb 10.3 oz) (04/01 0849)  Intake/Output Summary (Last 24 hours) at  12/02/2023 0834 Last data filed at 12/01/2023 1925 Gross per 24 hour  Intake 250 ml  Output 625 ml  Net -375 ml    Current Heart Failure Medications:  Loop diuretic: none, s/p furosemide 60 mg IV BID Beta-Blocker: none ACEI/ARB/ARNI: Entresto 24-26 mg BID MRA: none SGLT2i: none Other: none  Prior to admission Heart Failure Medications:  Loop diuretic: torsemide 20 mg BID Beta-Blocker: none ACEI/ARB/ARNI: Entresto 24-26 mg BID MRA: none SGLT2i: canagliflozin 100 mg daily Other: none  Assessment: 1. Acute on chronic systolic heart failure (LVEF 30-35%)  , due to presumed NICM. NYHA class III symptoms.  -Symptoms: Shortness of breath has improved, but still with dyspnea on exertion. Reports appetite is fine. Still with some  orthopnea. -Volume: Appears nearly euvolemic. Urine output not documented. No weight today. Would consider torsemide 40 mg daily when oral diuretics started. -Hemodynamics: BP is elevated. HR is 70s. -BB: Previously on carvedilol, but stopped due to hypotension. Would consider starting metoprolol succinate 25 mg daily before discharge. -ACEI/ARB/ARNI: Continue Entresto 24-26 mg BID -MRA: Previously on spironolactone, but stopped due to ?hypotension? Can consider finerenone outpatient given CKD and DM. -SGLT2i: Consider transitioning canagliflozin to dapagliflozin. Patient has tried Jardiance in the past and it made him dizzy.  Plan: 1) Medication changes recommended at this time: -Consider starting Farxiga 10 mg daily to replace canagliflozin.  2) Patient assistance: -Marcelline Deist is $0  3) Education: - Patient has been educated on current HF medications and potential additions to HF medication regimen - Patient verbalizes understanding that over the next few months, these medication doses may change and more medications may be added to optimize HF regimen - Patient has been educated on basic disease state pathophysiology and goals of therapy  Medication Assistance / Insurance Benefits Check: Does the patient have prescription insurance?    Type of insurance plan:  Does the patient qualify for medication assistance through manufacturers or grants? Pending  Outpatient Pharmacy: Prior to admission outpatient pharmacy: [facility]      Please do not hesitate to reach out with questions or concerns,  Enos Fling, PharmD, CPP, BCPS Heart Failure Pharmacist  Phone - [phone] 12/02/2023 2:30 PM

## 2023-12-02 NOTE — Plan of Care (Signed)

## 2023-12-02 NOTE — Progress Notes (Addendum)
 [facility] CLINIC CARDIOLOGY PROGRESS NOTE       Patient ID: [name] MRN: [id number] DOB/AGE: Jan 29, 1976 48 y.o.  Admit date: 12/01/2023 Referring Physician Dr. Doree Albee Primary Physician Revelo, Presley Raddle, MD  Primary Cardiologist Dorothyann Peng, MD Reason for Consultation Acute on chronic heart failure  HPI: Oscar [name] is a 48 y.o. male  with a past medical history of chronic HFrEF, nonischemic cardiomyopathy, LBBB s/p CRT-D, OSA, severe COPD (home O2 2-3L), CKD, type 2 diabetes mellitus, hyperlipidemia, hypertension, paroxysmal atrial fibrillation who presented to the ED on 12/01/2023 for worsening shortness of breath. Cardiology was consulted for further evaluation.   In history: -Patient seen and examined this morning, resting comfortably in hospital bed. -States that he is feeling much better today.  Shortness of breath much improved, no lower extremity edema.  Now on room air. -BP and heart rate remained stable.  Renal function slightly up today.  Review of systems complete and found to be negative unless listed above    Past Medical History:  Diagnosis Date   AICD (automatic cardioverter/defibrillator) present    Aortic atherosclerosis (HCC)    Asthma    Chronic respiratory failure (HCC)    CKD (chronic kidney disease), stage III (HCC)    COPD (chronic obstructive pulmonary disease) (HCC)    Deafness in right ear    Diabetes mellitus without complication (HCC)    Dyspnea    GERD (gastroesophageal reflux disease)    Gout    HFrEF (heart failure with reduced ejection fraction) (HCC)    a.) TTE 12/10/14: EF 25%; diff inf HK; sev LV and mod LA dil; LVH. b.) TTE 06/23/18: EF 20-25%; LVH; mild LV dil, mild BAE. c.) TTE 03/01/19: EF 15%, LVH, BAE; triv PR/TR. d.) TTE 02/27/20: EF < 20%; sev LV dil; mild MR. e.) TTE 11/23/20: EF < 20%; glob HK; sev LV dil; LVH; mild-mod BAE; mod-sev TR, triv AR; G1DD. f.) TTE 05/14/21: EF <15%; LVH; sev LA and mild RV enlar;  triv AR/PR, mild TR, mod MR.   Hiatal hernia    History of cardiac catheterization    a.) R/LHC 12/14/2009: normal coronaries. b.) R/LHC 12/11/2014: normal coronaries.   Hyperlipidemia    Hypertension    Hypoxemia    LBBB (left bundle branch block)    NICM (nonischemic cardiomyopathy; dilated cardiomyopathy) (HCC)    a.) R/LHC 12/14/2009: normal cors; LVEDP 18 mmHg, mean PA 29 mmHg, mean PCWP 31 mmHg; CO 6 L/min; CI 2.54 L/min/m. b.) TTE 12/10/2014: EF 25%. c.) R/LHC 12/11/2014: mean RA 9 mmHg, mean PA 22 mmHg, mean PCWP 22 mmHg. d.) TTE 06/23/2018: EF 20-25%. e.) TTE 03/01/2019: EF 15%. f.) TTE 02/27/2020: < 20%. g.) TTE 11/23/2020: EF <20%. h.) TTE 05/14/2021: < 15%.   NSTEMI (non-ST elevated myocardial infarction) (HCC)    a.) x 3 per patient report ---> 2012, 2014, 2016   Obesity    On supplemental oxygen by nasal cannula    a.) 2-3 L/[place] PRN   OSA on CPAP    PAF (paroxysmal atrial fibrillation) (HCC)    a.) CHA2DS2-VASc = 4 (HFrEF, HTN, prior MI, T2DM). b.) rate/rhythm maintained without pharmacologial intervention; no current anticoagulation.   Pancreatitis    PSVT (paroxysmal supraventricular tachycardia) [facility])     Past Surgical History:  Procedure Laterality Date   BIOPSY  11/25/2021   Procedure: BIOPSY;  Surgeon: Meridee Score Netty Starring., MD;  Location: [facility] ENDOSCOPY;  Service: Gastroenterology;;   CARDIAC CATHETERIZATION  12/11/2014   Procedure:  RIGHT/LEFT HEART CATH AND CORONARY ANGIOGRAPHY;  Surgeon: Runell Gess, MD;  Location: [facility] CATH LAB;  Service: Cardiovascular;;   COLONOSCOPY WITH PROPOFOL N/A 11/13/2021   Procedure: COLONOSCOPY WITH PROPOFOL;  Surgeon: Toney Reil, MD;  Location: [facility] ENDOSCOPY;  Service: Gastroenterology;  Laterality: N/A;   ESOPHAGOGASTRODUODENOSCOPY (EGD) WITH PROPOFOL N/A 11/25/2021   Procedure: ESOPHAGOGASTRODUODENOSCOPY (EGD) WITH PROPOFOL;  Surgeon: Meridee Score Netty Starring., MD;  Location: [facility] ENDOSCOPY;  Service: Gastroenterology;   Laterality: N/A;   ICD LEAD REMOVAL N/A 03/30/2015   Procedure: ICD LEAD REMOVAL;  Surgeon: Sharion Settler, MD;  Location: [facility] ORS;  Service: Cardiovascular;  Laterality: N/A;   IMPLANTABLE CARDIOVERTER DEFIBRILLATOR IMPLANT     INCISION AND DRAINAGE ABSCESS N/A 12/12/2020   Procedure: INCISION AND DRAINAGE ABSCESS;  Surgeon: Leafy Ro, MD;  Location: [facility] ORS;  Service: General;  Laterality: N/A;   INSERT / REPLACE / REMOVE PACEMAKER     LEFT HEART CATHETERIZATION WITH CORONARY ANGIOGRAM N/A 12/09/2014   Procedure: LEFT HEART CATHETERIZATION WITH CORONARY ANGIOGRAM;  Surgeon: Kathleene Hazel, MD;  Location: [facility] CATH LAB;  Service: Cardiovascular;  Laterality: N/A;   RIGHT/LEFT HEART CATH AND CORONARY ANGIOGRAPHY Bilateral 12/14/2009   Procedure: RIGHT/LEFT HEART CATH AND CORONARY ANGIOGRAPHY; Location: [facility]; Surgeon: Rudean Hitt, MD   TONSILLECTOMY     as a child , adnoids removed   UPPER ESOPHAGEAL ENDOSCOPIC ULTRASOUND (EUS) N/A 11/25/2021   Procedure: UPPER ESOPHAGEAL ENDOSCOPIC ULTRASOUND (EUS);  Surgeon: Lemar Lofty., MD;  Location: [facility];  Service: Gastroenterology;  Laterality: N/A;    (Not in a hospital admission)  Social History   Socioeconomic History   Marital status: Single    Spouse name: Not on file   Number of children: Not on file   Years of education: Not on file   Highest education level: Not on file  Occupational History   Occupation: unemployed  Tobacco Use   Smoking status: Former    Current packs/day: 0.00    Average packs/day: 1.5 packs/day for 34.0 years (51.0 ttl pk-yrs)    Types: Cigarettes    Quit date: 05/2023    Years since quitting: 0.5    Passive exposure: Past   Smokeless tobacco: Never   Tobacco comments:    Quit smoking 05/2023  Vaping Use   Vaping status: Never Used  Substance and Sexual Activity   Alcohol use: No   Drug use: No   Sexual activity: Yes  Other Topics Concern   Not on file  Social  History Narrative   Live alone   Social Drivers of Health   Financial Resource Strain: Low Risk  (11/23/2023)   Received from [facility]   Overall Financial Resource Strain (CARDIA)    Difficulty of Paying Living Expenses: Not very hard  Food Insecurity: Food Insecurity Present (11/23/2023)   [facility]   Hunger Vital Sign    Worried About Running Out of Food in the Last Year: Sometimes true    Ran Out of Food in the Last Year: Sometimes true  Transportation Needs: No Transportation Needs (11/23/2023)   Received from [facility]   PRAPARE - Transportation    In the past 12 months, has lack of transportation kept you from medical appointments or from getting medications?: No    Lack of Transportation (Non-Medical): No  Physical Activity: Not on file  Stress: Not on file  Social Connections: Not on file  Intimate Partner Violence: Not At  Risk (06/13/2023)   Humiliation, Afraid, Rape, and Kick questionnaire    Fear of Current or Ex-Partner: No    Emotionally Abused: No    Physically Abused: No    Sexually Abused: No    Family History  Problem Relation Age of Onset   Hypertension Mother    Congestive Heart Failure Mother    Hypertension Sister    Diabetes Sister    Pancreatitis Sister    COPD Sister    Emphysema Sister        smoked   Pancreatitis Brother    Anemia Neg Hx    Arrhythmia Neg Hx    Asthma Neg Hx    Clotting disorder Neg Hx    Fainting Neg Hx    Heart attack Neg Hx    Heart disease Neg Hx    Heart failure Neg Hx    Hyperlipidemia Neg Hx      Vitals:   12/02/23 0900 12/02/23 0901 12/02/23 1000 12/02/23 1030  BP: 128/76 128/76 (!) 105/52 118/74  Pulse: 87 70  68  Resp: 20 19 (!) 21 13  Temp:      TempSrc:      SpO2: 91% 97%  96%  Weight:      Height:        PHYSICAL EXAM General: Chronically ill-appearing male, well nourished, in no acute distress. HEENT: Normocephalic and  atraumatic. Neck: No JVD.  Lungs: Normal respiratory effort on room air. Rhonchi bilaterally.  Heart: HRRR. Normal S1 and S2 without gallops or murmurs.  Abdomen: Non-distended appearing.  Msk: Normal strength and tone for age. Extremities: Warm and well perfused. No clubbing, cyanosis. No edema.  Neuro: Alert and oriented X 3. Psych: Answers questions appropriately.   Labs: Basic Metabolic Panel: Recent Labs    12/01/23 0831 12/02/23 0638  NA 134* 132*  K 4.0 4.1  CL 101 99  CO2 22 22  GLUCOSE 170* 374*  BUN 51* 57*  CREATININE 1.61* 1.87*  CALCIUM 8.9 8.9   Liver Function Tests: Recent Labs    12/02/23 0638  AST 33  ALT 34  ALKPHOS 176*  BILITOT 0.6  PROT 8.3*  ALBUMIN 3.3*   No results for input(s): "LIPASE", "AMYLASE" in the last 72 hours. CBC: Recent Labs    12/01/23 0831 12/02/23 0638  WBC 5.0 6.2  HGB 13.2 12.8*  HCT 37.6* 36.1*  MCV 92.8 90.7  PLT 144* 141*   Cardiac Enzymes: Recent Labs    12/01/23 0831 12/01/23 1310  TROPONINIHS 92* 74*   BNP: Recent Labs    12/01/23 0840  BNP 319.6*   D-Dimer: No results for input(s): "DDIMER" in the last 72 hours. Hemoglobin A1C: No results for input(s): "HGBA1C" in the last 72 hours. Fasting Lipid Panel: No results for input(s): "CHOL", "HDL", "LDLCALC", "TRIG", "CHOLHDL", "LDLDIRECT" in the last 72 hours. Thyroid Function Tests: No results for input(s): "TSH", "T4TOTAL", "T3FREE", "THYROIDAB" in the last 72 hours.  Invalid input(s): "FREET3" Anemia Panel: No results for input(s): "VITAMINB12", "FOLATE", "FERRITIN", "TIBC", "IRON", "RETICCTPCT" in the last 72 hours.   Radiology: DG Chest 2 View Result Date: 12/01/2023 CLINICAL DATA:  Cough. EXAM: CHEST - 2 VIEW COMPARISON:  Chest radiograph dated 11/07/2023. FINDINGS: Stable cardiomegaly and central pulmonary vascular congestion. Stable left-sided pacemaker. No evidence of overt edema. No focal consolidation, pleural effusion, or pneumothorax. No  acute osseous abnormality. IMPRESSION: Stable cardiomegaly with central pulmonary vascular congestion. Electronically Signed   By: Hart Robinsons M.D.   On:  12/01/2023 09:51   DG Elbow Complete Right Result Date: 11/07/2023 CLINICAL DATA:  pain EXAM: RIGHT ELBOW - COMPLETE 3+ VIEW COMPARISON:  None Available. FINDINGS: No acute fracture or dislocation. Joint spaces and alignment are maintained. No area of erosion or osseous destruction. No unexpected radiopaque foreign body. Soft tissues are unremarkable. IMPRESSION: No acute fracture or dislocation. Electronically Signed   By: Meda Klinefelter M.D.   On: 11/07/2023 16:31   DG Chest 2 View Result Date: 11/07/2023 CLINICAL DATA:  Chest pain for 3 days. EXAM: CHEST - 2 VIEW COMPARISON:  June 12, 2023. FINDINGS: Stable cardiomegaly. Left-sided defibrillator is unchanged. Both lungs are clear. The visualized skeletal structures are unremarkable. IMPRESSION: No active cardiopulmonary disease. Electronically Signed   By: Lupita Raider M.D.   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 male  with a past medical history of chronic HFrEF, nonischemic cardiomyopathy, LBBB s/p CRT-D, OSA, severe COPD (home O2 2-3L), CKD, type 2 diabetes mellitus, hyperlipidemia, hypertension, paroxysmal atrial fibrillation who presented to the ED on 12/01/2023 for  worsening shortness of breath. Cardiology was consulted for further evaluation.   # Acute on chronic HFrEF (EF < 20% 10/20/2022) # Nonischemic Cardiomyopathy, LBBB s/p CRT-D  # COPD exacerbation Patient reports worsening SOB for the past 2 weeks, endorses cough, and denies any lower extremity swelling.  Patient reports that his shortness of breath has improved since presenting to the ED. BNP 319. Troponin trending flat 92 > 74. Patient on 4L O2 [place] with baseline at 2-3L at home. S/p 1 dose of IV lasix in the ED. -Echo pending -Likely resume p.o. torsemide tomorrow. -Resume home Entresto 24-26 mg twice daily. Plan to optimize other GDMT throughout admisison.  -Manage COPD per primary. -Continue atorvastatin 40 mg daily -Mildly elevated and flat trending troponin likely demand in the setting of acute heart failure and COPD exacerbation. No plan for further cardiac invasive evaluation.   # Paroxysmal atrial fibrillation -Will start BB as heart failure improves.  Likely tomorrow. -Continue eliquis 5 mg twice daily for stroke risk reduction.   This patient's plan of care was discussed and created with Dr. Darrold Junker and he is in agreement.  Signed: Gale Journey, PA-C  12/02/2023, 11:27 AM [facility]

## 2023-12-03 ENCOUNTER — Other Ambulatory Visit (HOSPITAL_COMMUNITY): Payer: Self-pay

## 2023-12-03 ENCOUNTER — Emergency Department
Admission: EM | Admit: 2023-12-03 | Discharge: 2023-12-04 | Disposition: A | Discharge status: Home / Self Care | Source: Home / Self Care | Attending: Emergency Medicine | Admitting: Emergency Medicine

## 2023-12-03 ENCOUNTER — Emergency Department

## 2023-12-03 ENCOUNTER — Other Ambulatory Visit: Payer: Self-pay

## 2023-12-03 DIAGNOSIS — R0602 Shortness of breath: Secondary | ICD-10-CM | POA: Insufficient documentation

## 2023-12-03 DIAGNOSIS — R079 Chest pain, unspecified: Secondary | ICD-10-CM | POA: Insufficient documentation

## 2023-12-03 DIAGNOSIS — I509 Heart failure, unspecified: Secondary | ICD-10-CM | POA: Insufficient documentation

## 2023-12-03 DIAGNOSIS — G4733 Obstructive sleep apnea (adult) (pediatric): Secondary | ICD-10-CM | POA: Diagnosis not present

## 2023-12-03 DIAGNOSIS — N1831 Chronic kidney disease, stage 3a: Secondary | ICD-10-CM | POA: Diagnosis not present

## 2023-12-03 DIAGNOSIS — E1149 Type 2 diabetes mellitus with other diabetic neurological complication: Secondary | ICD-10-CM

## 2023-12-03 DIAGNOSIS — J9621 Acute and chronic respiratory failure with hypoxia: Secondary | ICD-10-CM | POA: Diagnosis not present

## 2023-12-03 LAB — BASIC METABOLIC PANEL WITH GFR
Anion gap: 12 (ref 5–15)
Anion gap: 14 (ref 5–15)
BUN: 50 mg/dL — ABNORMAL HIGH (ref 6–20)
BUN: 50 mg/dL — ABNORMAL HIGH (ref 6–20)
CO2: 23 mmol/L (ref 22–32)
CO2: 21 mmol/L — ABNORMAL LOW (ref 22–32)
Calcium: 9 mg/dL (ref 8.9–10.3)
Calcium: 8.9 mg/dL (ref 8.9–10.3)
Chloride: 100 mmol/L (ref 98–111)
Chloride: 100 mmol/L (ref 98–111)
Creatinine, Ser: 1.47 mg/dL — ABNORMAL HIGH (ref 0.61–1.24)
Creatinine, Ser: 1.77 mg/dL — ABNORMAL HIGH (ref 0.61–1.24)
GFR, Estimated: 58 mL/min — ABNORMAL LOW (ref >60)
GFR, Estimated: 47 mL/min — ABNORMAL LOW (ref >60)
Glucose, Bld: 246 mg/dL — ABNORMAL HIGH (ref 70–99)
Glucose, Bld: 325 mg/dL — ABNORMAL HIGH (ref 70–99)
Potassium: 3.9 mmol/L (ref 3.5–5.1)
Potassium: 4.6 mmol/L (ref 3.5–5.1)
Sodium: 135 mmol/L (ref 135–145)
Sodium: 135 mmol/L (ref 135–145)

## 2023-12-03 LAB — CBC
HCT: 38 % — ABNORMAL LOW (ref 39.0–52.0)
Hemoglobin: 13.4 g/dL (ref 13.0–17.0)
MCH: 33.3 pg (ref 26.0–34.0)
MCHC: 35.3 g/dL (ref 30.0–36.0)
MCV: 94.3 fL (ref 80.0–100.0)
Platelets: 161 10*3/uL (ref 150–400)
RBC: 4.03 MIL/uL — ABNORMAL LOW (ref 4.22–5.81)
RDW: 12.3 % (ref 11.5–15.5)
WBC: 10 10*3/uL (ref 4.0–10.5)
nRBC: 0 % (ref 0.0–0.2)

## 2023-12-03 LAB — HEMOGLOBIN A1C
Hgb A1c MFr Bld: 8.5 % — ABNORMAL HIGH (ref 4.8–5.6)
Mean Plasma Glucose: 197.25 mg/dL

## 2023-12-03 LAB — BRAIN NATRIURETIC PEPTIDE: B Natriuretic Peptide: 326.9 pg/mL — ABNORMAL HIGH (ref 0.0–100.0)

## 2023-12-03 LAB — TROPONIN I (HIGH SENSITIVITY): Troponin I (High Sensitivity): 82 ng/L — ABNORMAL HIGH (ref <18)

## 2023-12-03 LAB — GLUCOSE, CAPILLARY: Glucose-Capillary: 351 mg/dL — ABNORMAL HIGH (ref 70–99)

## 2023-12-03 MED ORDER — DAPAGLIFLOZIN PROPANEDIOL 10 MG PO TABS
10.0000 mg | ORAL_TABLET | Freq: Every day | ORAL | 2 refills | Status: DC
Start: 2023-12-04 — End: 2024-04-18

## 2023-12-03 MED ORDER — METOPROLOL SUCCINATE ER 25 MG PO TB24: 12.5000 mg | ORAL_TABLET | Freq: Every day | ORAL | 1 refills | Status: DC

## 2023-12-03 MED ORDER — PANTOPRAZOLE SODIUM 40 MG PO TBEC: 40.0000 mg | DELAYED_RELEASE_TABLET | Freq: Every day | ORAL | Status: AC

## 2023-12-03 MED ORDER — INSULIN GLARGINE 100 UNIT/ML SOLOSTAR PEN: 15.0000 [IU] | PEN_INJECTOR | Freq: Every day | SUBCUTANEOUS | 0 refills | Status: DC

## 2023-12-03 MED ORDER — PREDNISONE 20 MG PO TABS: ORAL_TABLET | ORAL | 0 refills | Status: DC

## 2023-12-03 MED ORDER — APIXABAN 5 MG PO TABS: 5.0000 mg | ORAL_TABLET | Freq: Two times a day (BID) | ORAL | 1 refills | Status: DC

## 2023-12-03 MED ORDER — TORSEMIDE 20 MG PO TABS
40.0000 mg | ORAL_TABLET | Freq: Every day | ORAL | Status: DC
  Administered 2023-12-03: 40 mg via ORAL
  Filled 2023-12-03: qty 2

## 2023-12-03 MED ORDER — DAPAGLIFLOZIN PROPANEDIOL 10 MG PO TABS
10.0000 mg | ORAL_TABLET | Freq: Every day | ORAL | Status: DC
  Administered 2023-12-03: 10 mg via ORAL
  Filled 2023-12-03: qty 1

## 2023-12-03 MED ORDER — METOPROLOL SUCCINATE ER 25 MG PO TB24
12.5000 mg | ORAL_TABLET | Freq: Every day | ORAL | Status: DC
  Administered 2023-12-03: 12.5 mg via ORAL
  Filled 2023-12-03: qty 1

## 2023-12-03 NOTE — TOC Benefit Eligibility Note (Signed)
 Pharmacy Patient Advocate Encounter  Insurance verification completed.    The patient is insured through  [employer] Part D . Patient has Medicare and is not eligible for a copay card, but may be able to apply for patient assistance or Medicare RX Payment Plan (Patient Must reach out to their plan, if eligible for payment plan), if available.    Ran test claim for Eliquis and the current 30 day co-pay is $0.   This test claim was processed through [facility]- copay amounts may vary at other pharmacies due to [employer], or as the patient moves through the different stages of their insurance plan.

## 2023-12-03 NOTE — Inpatient Diabetes Management (Signed)
 Inpatient Diabetes Program Recommendations  AACE/ADA: New Consensus Statement on Inpatient Glycemic Control  Target Ranges:  Prepandial:   less than 140 mg/dL      Peak postprandial:   less than 180 mg/dL (1-2 hours)      Critically ill patients:  140 - 180 mg/dL    Latest Reference Range & Units 12/02/23 07:27 12/02/23 11:29 12/02/23 16:07 12/02/23 20:36 12/02/23 23:47 12/03/23 08:50  Glucose-Capillary 70 - 99 mg/dL 161 (H) 096 (H) 045 (H) 313 (H) 294 (H) 351 (H)     Latest Reference Range & Units 06/13/23 03:56  Hemoglobin A1C 4.8 - 5.6 % 7.3 (H)   Review of Glycemic Control  Diabetes history: DM2 Outpatient Diabetes medications: Invokana 100 mg daily Current orders for Inpatient glycemic control: Farxiga 10 mg daily, Semglee 6 units at bedtime, Novolog 10 units TID with meals, Novolog 0-15 units TID with meals; Prednisone 40 mg QAM  Inpatient Diabetes Program Recommendations:    Outpatient DM: At time of discharge if steroids are prescribed outpatient, please consider discharging on Lantus 15 units at bedtime. Would make note that as steroids are tapered and stopped, insulin will likely need to be adjusted or stopped. Would need Rx for Lantus Solostar [phone]) and insulin pen needles [phone]).  NOTE: Spoke with patient over the phone. Patient reports that he takes Invokana 100 mg daily as an outpatient for DM control. Patient states that cardiologist today told him that what to switch the Invokana to [place].  Patient reports that he has been on steroids for 2 weeks prior to admission and CBGs have been in the 300's mg/dl since being on the steroids. Patient states that he has taken insulin in the past (prefers insulin pens) and he would be willing to use insulin again. Discussed hyperglycemia with steroids currently and that if he is discharged on steroids, he will need to use insulin at least while on steroids. Discussed checking glucose 3-4 times per day and explained that as steroids  are tapered and he is able to stop, glucose should improve and come down. Therefore, as glucose comes down, insulin would need to be adjusted or possibly stopped. Asked patient to make follow up appointment with PCP regarding DM control. Discussed hypoglycemia along with treatment. Asked patient to reach out to his PCP if he has any issues at all with hypoglycemia so he can be instructed what to do about the insulin.  Discussed that it would be recommended that he be discharged on once a day basal insulin along with oral DM medication. Patient verbalized understanding and has no questions at this time.  Thanks, Orlando Penner, RN, MSN, CDCES Diabetes Coordinator Inpatient Diabetes Program [phone] (Team Pager from 8am to 5pm)

## 2023-12-03 NOTE — Plan of Care (Signed)
 Problem: Education: Goal: Ability to describe self-care measures that may prevent or decrease complications (Diabetes Survival Skills Education) will improve Outcome: Adequate for Discharge Goal: Individualized Educational Video(s) Outcome: Adequate for Discharge   Problem: Coping: Goal: Ability to adjust to condition or change in health will improve Outcome: Adequate for Discharge   Problem: Fluid Volume: Goal: Ability to maintain a balanced intake and output will improve Outcome: Adequate for Discharge   Problem: Health Behavior/Discharge Planning: Goal: Ability to identify and utilize available resources and services will improve Outcome: Adequate for Discharge Goal: Ability to manage health-related needs will improve Outcome: Adequate for Discharge   Problem: Metabolic: Goal: Ability to maintain appropriate glucose levels will improve Outcome: Adequate for Discharge   Problem: Nutritional: Goal: Maintenance of adequate nutrition will improve Outcome: Adequate for Discharge Goal: Progress toward achieving an optimal weight will improve Outcome: Adequate for Discharge   Problem: Skin Integrity: Goal: Risk for impaired skin integrity will decrease Outcome: Adequate for Discharge   Problem: Tissue Perfusion: Goal: Adequacy of tissue perfusion will improve Outcome: Adequate for Discharge   Problem: Education: Goal: Knowledge of General Education information will improve Description: Including pain rating scale, medication(s)/side effects and non-pharmacologic comfort measures Outcome: Adequate for Discharge   Problem: Health Behavior/Discharge Planning: Goal: Ability to manage health-related needs will improve Outcome: Adequate for Discharge   Problem: Clinical Measurements: Goal: Ability to maintain clinical measurements within normal limits will improve Outcome: Adequate for Discharge Goal: Will remain free from infection Outcome: Adequate for Discharge Goal:  Diagnostic test results will improve Outcome: Adequate for Discharge Goal: Respiratory complications will improve Outcome: Adequate for Discharge Goal: Cardiovascular complication will be avoided Outcome: Adequate for Discharge   Problem: Activity: Goal: Risk for activity intolerance will decrease Outcome: Adequate for Discharge   Problem: Nutrition: Goal: Adequate nutrition will be maintained Outcome: Adequate for Discharge   Problem: Coping: Goal: Level of anxiety will decrease Outcome: Adequate for Discharge   Problem: Elimination: Goal: Will not experience complications related to bowel motility Outcome: Adequate for Discharge Goal: Will not experience complications related to urinary retention Outcome: Adequate for Discharge   Problem: Pain Managment: Goal: General experience of comfort will improve and/or be controlled Outcome: Adequate for Discharge   Problem: Safety: Goal: Ability to remain free from injury will improve Outcome: Adequate for Discharge   Problem: Skin Integrity: Goal: Risk for impaired skin integrity will decrease Outcome: Adequate for Discharge   Problem: Education: Goal: Ability to demonstrate management of disease process will improve Outcome: Adequate for Discharge Goal: Ability to verbalize understanding of medication therapies will improve Outcome: Adequate for Discharge Goal: Individualized Educational Video(s) Outcome: Adequate for Discharge   Problem: Activity: Goal: Capacity to carry out activities will improve Outcome: Adequate for Discharge   Problem: Cardiac: Goal: Ability to achieve and maintain adequate cardiopulmonary perfusion will improve Outcome: Adequate for Discharge   Problem: Education: Goal: Knowledge of disease or condition will improve Outcome: Adequate for Discharge Goal: Knowledge of the prescribed therapeutic regimen will improve Outcome: Adequate for Discharge Goal: Individualized Educational  Video(s) Outcome: Adequate for Discharge   Problem: Activity: Goal: Ability to tolerate increased activity will improve Outcome: Adequate for Discharge Goal: Will verbalize the importance of balancing activity with adequate rest periods Outcome: Adequate for Discharge   Problem: Respiratory: Goal: Ability to maintain a clear airway will improve Outcome: Adequate for Discharge Goal: Levels of oxygenation will improve Outcome: Adequate for Discharge Goal: Ability to maintain adequate ventilation will improve Outcome: Adequate for Discharge

## 2023-12-03 NOTE — Discharge Summary (Signed)
 Physician Discharge Summary   Patient: Oscar [name] MRN: [id number] DOB: 1976-02-11  Admit date:     12/01/2023  Discharge date: {dischdate:26783}  Discharge Physician: Pennie Banter   PCP: Preston Fleeting, MD   Recommendations at discharge:  {Tip this will not be part of the note when signed- Example include specific recommendations for outpatient follow-up, pending tests to follow-up on. 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 *  Hospital Course: No notes on file  Assessment and Plan: Acute on chronic respiratory failure with hypoxia (HCC) Acute on chronic HFrEF COPD exacerbation Decompensated resp status now requiring 4-5L [place] in setting of acute on chronic HFrEF, COPD Exacerbation  Baseline 3L [place] O2 use at home  2D echo February 2024 with EF of less than 20% Positive diffuse wheezing on exam Chest x-ray today with cardiomegaly and pulmonary vascular congestion BNP today 320 which is mildly above baseline. 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 , ,  (Optional):26781}  {(NOTE) Pain control PDMP Statment (Optional):26782} Consultants: *** Procedures performed: ***  Disposition: {Plan; Disposition:26390} Diet recommendation:  {Diet_Plan:26776} DISCHARGE MEDICATION: Allergies as of 12/03/2023       Reactions   Ciprofloxacin Swelling, Other (See Comments)   Migraine Headache   Iodinated Contrast Media    Other reaction(s): Vomiting Projectile vomiting   Isosorb Dinitrate-hydralazine Other (See Comments)   Migraine Headache     Med Rec must be completed prior to using this SMARTLINK***       Follow-up Information     Callwood, Dwayne D, MD. Go in 1 week(s).   Specialties: Cardiology, Internal Medicine Contact information: [address] [place] [address] [phone]         [facility]. Go on 12/09/2023.   Specialty: Cardiology Why: Hospital Follow-Up 12/09/2023 @ 8:30 AM Please bring all medications to follow-up appointment [facility], [address] Floor Free Valet Parking at the door Contact information: [address] [place] [address] [phone]               Discharge Exam: [name] Weights   12/01/23 0849 12/03/23 0500  Weight: (!) 140 kg (!) 138.4 kg   ***  Condition at discharge: {DC Condition:26389}  The results of significant diagnostics from this hospitalization (including imaging, microbiology, ancillary and laboratory)  are listed below for reference.   Imaging Studies: ECHOCARDIOGRAM COMPLETE Result Date: 12/02/2023    ECHOCARDIOGRAM REPORT   Patient Name:   Oscar [name] Date of Exam: 12/01/2023 Medical Rec #:  [id number]         Height:       65.5 in Accession #:    [id number]       Weight:       308.6 lb Date of Birth:  04-30-1976       BSA:          2.392 m Patient Age:    48 years         BP:           133/108 mmHg Patient Gender: M                HR:           68 bpm. Exam Location:  [facility] Procedure: 2D Echo, Cardiac Doppler, Color Doppler and Intracardiac            Opacification Agent (Both Spectral and Color Flow Doppler were            utilized during procedure). Indications:     I50.21 Acute Systolic Heart Failure  History:         Patient has prior history of Echocardiogram examinations, most                  recent 10/20/2022. Defibrillator, COPD, Arrythmias:LBBB,                  Signs/Symptoms:Dyspnea; Risk Factors:Hypertension, Diabetes and                  Dyslipidemia. Nonischemic cardiomyopathy. Obstructive sleep                  apnea-CPAP.  Sonographer:     Daphine Deutscher RDCS Referring Phys:  [id number] Francoise Schaumann [place] Diagnosing Phys: Marcina Millard MD IMPRESSIONS  1. Left ventricular ejection fraction, by estimation, is 30 to 35%. The left ventricle has moderately decreased function. The left ventricle demonstrates regional wall motion abnormalities (see scoring diagram/findings for description). The left ventricular internal cavity size was moderately dilated. Left ventricular diastolic parameters are indeterminate.  2. Right ventricular systolic function is normal. The right ventricular size is normal.  3. The mitral valve is normal in structure. Mild mitral valve regurgitation. No evidence of mitral stenosis.  4. The aortic valve is normal in structure. Aortic valve regurgitation is not visualized. No aortic stenosis is present.  5. The inferior vena cava is normal in size with greater than 50% respiratory variability, suggesting right atrial pressure of 3 mmHg. FINDINGS  Left Ventricle: Left ventricular ejection fraction, by estimation, is 30 to 35%. The left ventricle has moderately decreased function. The  left ventricle demonstrates regional wall motion abnormalities. Definity contrast agent was given IV to delineate the left ventricular endocardial borders. Strain was performed and the global longitudinal strain is indeterminate. The left ventricular internal cavity size was moderately dilated. There is no left ventricular hypertrophy. Left ventricular diastolic parameters are indeterminate.  LV Wall Scoring: The apical lateral segment and apex are akinetic. The mid anterolateral segment and apical septal segment are hypokinetic. Right Ventricle: The right ventricular size is normal. No increase in right ventricular wall thickness. Right ventricular systolic function is normal. Left Atrium: Left atrial size was normal in size. Right Atrium: Right atrial size was normal in size. Pericardium: There is no evidence  of pericardial effusion. Mitral Valve: The mitral valve is normal in structure. Mild mitral valve regurgitation. No evidence of mitral valve stenosis. Tricuspid Valve: The tricuspid valve is normal in structure. Tricuspid valve regurgitation is mild . No evidence of tricuspid stenosis. Aortic Valve: The aortic valve is normal in structure. Aortic valve regurgitation is not visualized. No aortic stenosis is present. Pulmonic Valve: The pulmonic valve was normal in structure. Pulmonic valve regurgitation is not visualized. No evidence of pulmonic stenosis. Aorta: The aortic root is normal in size and structure. Venous: The inferior vena cava is normal in size with greater than 50% respiratory variability, suggesting right atrial pressure of 3 mmHg. IAS/Shunts: No atrial level shunt detected by color flow Doppler. Additional Comments: 3D was performed not requiring image post processing on an independent workstation and was indeterminate.  LEFT VENTRICLE PLAX 2D LVIDd:         8.20 cm Diastology LVIDs:         6.80 cm LV e' medial:    5.38 cm/s LV PW:         0.90 cm LV E/e' medial:  9.9 LV IVS:        0.80 cm  LV e' lateral:   6.36 cm/s                        LV E/e' lateral: 8.4  RIGHT VENTRICLE             IVC RV Basal diam:  4.70 cm     IVC diam: 1.70 cm RV S prime:     12.60 cm/s TAPSE (M-mode): 2.7 cm LEFT ATRIUM              Index        RIGHT ATRIUM           Index LA diam:        6.40 cm  2.68 cm/m   RA Area:     15.60 cm LA Vol (A2C):   83.0 ml  34.70 ml/m  RA Volume:   45.30 ml  18.94 ml/m LA Vol (A4C):   113.0 ml 47.24 ml/m LA Biplane Vol: 99.5 ml  41.59 ml/m  AORTIC VALVE LVOT Vmax:   94.47 cm/s LVOT Vmean:  59.633 cm/s LVOT VTI:    0.159 m  AORTA Ao Root diam: 3.70 cm Ao Asc diam:  4.20 cm MITRAL VALVE MV Area (PHT): 4.18 cm    SHUNTS MV Decel Time: 182 msec    Systemic VTI: 0.16 m MV E velocity: 53.55 cm/s MV A velocity: 86.20 cm/s MV E/A ratio:  0.62 Marcina Millard MD Electronically signed by Marcina Millard MD Signature Date/Time: 12/02/2023/1:40:39 PM    Final    DG Chest 2 View Result Date: 12/01/2023 CLINICAL DATA:  Cough. EXAM: CHEST - 2 VIEW COMPARISON:  Chest radiograph dated 11/07/2023. FINDINGS: Stable cardiomegaly and central pulmonary vascular congestion. Stable left-sided pacemaker. No evidence of overt edema. No focal consolidation, pleural effusion, or pneumothorax. No acute osseous abnormality. IMPRESSION: Stable cardiomegaly with central pulmonary vascular congestion. Electronically Signed   By: Hart Robinsons M.D.   On: 12/01/2023 09:51   DG Elbow Complete Right Result Date: 11/07/2023 CLINICAL DATA:  pain EXAM: RIGHT ELBOW - COMPLETE 3+ VIEW COMPARISON:  None Available. FINDINGS: No acute fracture or dislocation. Joint spaces and alignment are maintained. No area of erosion or osseous destruction. No unexpected radiopaque foreign body. Soft tissues are unremarkable. IMPRESSION: No acute fracture or  dislocation. Electronically Signed   By: Meda Klinefelter M.D.   On: 11/07/2023 16:31   DG Chest 2 View Result Date: 11/07/2023 CLINICAL DATA:  Chest pain for 3 days.  EXAM: CHEST - 2 VIEW COMPARISON:  June 12, 2023. FINDINGS: Stable cardiomegaly. Left-sided defibrillator is unchanged. Both lungs are clear. The visualized skeletal structures are unremarkable. IMPRESSION: No active cardiopulmonary disease. Electronically Signed   By: Lupita Raider M.D.   On: 11/07/2023 15:14    Microbiology: Results for orders placed or performed during the hospital encounter of 08/03/23  Chlamydia/NGC rt PCR ([facility] only)     Status: None   Collection Time: 08/03/23 11:26 AM   Specimen: Urine, Random  Result Value Ref Range Status   Specimen source GC/Chlam URINE, RANDOM  Final   Chlamydia Tr NOT DETECTED NOT DETECTED Final   N gonorrhoeae NOT DETECTED NOT DETECTED Final    Comment: (NOTE) This CT/NG assay has not been evaluated in patients with a history of  hysterectomy. Performed at [facility], [address] [place], [place] [address]     Labs: CBC: Recent Labs  Lab 12/01/23 0831 12/02/23 0638  WBC 5.0 6.2  HGB 13.2 12.8*  HCT 37.6* 36.1*  MCV 92.8 90.7  PLT 144* 141*   Basic Metabolic Panel: Recent Labs  Lab 12/01/23 0831 12/02/23 0638 12/03/23 0432  NA 134* 132* 135  K 4.0 4.1 3.9  CL 101 99 100  CO2 22 22 23   GLUCOSE 170* 374* 246*  BUN 51* 57* 50*  CREATININE 1.61* 1.87* 1.47*  CALCIUM 8.9 8.9 9.0   Liver Function Tests: Recent Labs  Lab 12/02/23 0638  AST 33  ALT 34  ALKPHOS 176*  BILITOT 0.6  PROT 8.3*  ALBUMIN 3.3*   CBG: Recent Labs  Lab 12/02/23 1129 12/02/23 1607 12/02/23 2036 12/02/23 2347 12/03/23 0850  GLUCAP 311* 309* 313* 294* 351*    Discharge time spent: {LESS THAN/GREATER XBJY:78295} 30 minutes.  Signed: Pennie Banter, DO [facility] 12/03/2023

## 2023-12-03 NOTE — ED Triage Notes (Signed)
 Pt arrived home BIB [facility] after being discharge earlier today from admission for NSTEMI, CHF exacerbation, CKD, & acute resp failure. Pt st around 9 pm was getting in shower and had CP, light headed and sob, pt thinks it may have been from not waring his oxygen and concern his fluid has built back up. Pt has no CP at current. A&O x4, Sats 96% on his normal 3L [place], reports SOB w/exertion.   EMS vitals 116/78 359 CBG 89 HR 96% 3L

## 2023-12-03 NOTE — TOC Benefit Eligibility Note (Signed)
 Pharmacy Patient Advocate Encounter  Insurance verification completed.    The patient is insured through  [employer] Part D . Patient has Medicare and is not eligible for a copay card, but may be able to apply for patient assistance or Medicare RX Payment Plan (Patient Must reach out to their plan, if eligible for payment plan), if available.    Ran test claim for Lantus Solostart and the current 30 day co-pay is $0.  Ran test claim for Toujeo and the current 30 day co-pay is $0.  This test claim was processed through [employer]- copay amounts may vary at other pharmacies due to [employer], or as the patient moves through the different stages of their insurance plan.

## 2023-12-03 NOTE — Progress Notes (Signed)
 [facility] CLINIC CARDIOLOGY PROGRESS NOTE       Patient ID: Oscar [name] MRN: [id number] DOB/AGE: 1976-07-19 48 y.o.  Admit date: 12/01/2023 Referring Physician Dr. Doree Albee Primary Physician Revelo, Presley Raddle, MD  Primary Cardiologist Dorothyann Peng, MD Reason for Consultation Acute on chronic heart failure  HPI: Oscar [name] is a 48 y.o. male  with a past medical history of chronic HFrEF, nonischemic cardiomyopathy, LBBB s/p CRT-D, OSA, severe COPD (home O2 2-3L), CKD, type 2 diabetes mellitus, hyperlipidemia, hypertension, paroxysmal atrial fibrillation who presented to the ED on 12/01/2023 for worsening shortness of breath. Cardiology was consulted for further evaluation.   In history: -Patient seen and examined this morning, resting comfortably in hospital bed. -States that he is feeling much better today.  Shortness of breath much improved, no lower extremity edema.  Now on room air. -BP and heart rate remained stable.  Renal function improved today.  Review of systems complete and found to be negative unless listed above    Past Medical History:  Diagnosis Date   AICD (automatic cardioverter/defibrillator) present    Aortic atherosclerosis (HCC)    Asthma    Chronic respiratory failure (HCC)    CKD (chronic kidney disease), stage III (HCC)    COPD (chronic obstructive pulmonary disease) (HCC)    Deafness in right ear    Diabetes mellitus without complication (HCC)    Dyspnea    GERD (gastroesophageal reflux disease)    Gout    HFrEF (heart failure with reduced ejection fraction) (HCC)    a.) TTE 12/10/14: EF 25%; diff inf HK; sev LV and mod LA dil; LVH. b.) TTE 06/23/18: EF 20-25%; LVH; mild LV dil, mild BAE. c.) TTE 03/01/19: EF 15%, LVH, BAE; triv PR/TR. d.) TTE 02/27/20: EF < 20%; sev LV dil; mild MR. e.) TTE 11/23/20: EF < 20%; glob HK; sev LV dil; LVH; mild-mod BAE; mod-sev TR, triv AR; G1DD. f.) TTE 05/14/21: EF <15%; LVH; sev LA and mild RV enlar; triv  AR/PR, mild TR, mod MR.   Hiatal hernia    History of cardiac catheterization    a.) R/LHC 12/14/2009: normal coronaries. b.) R/LHC 12/11/2014: normal coronaries.   Hyperlipidemia    Hypertension    Hypoxemia    LBBB (left bundle branch block)    NICM (nonischemic cardiomyopathy; dilated cardiomyopathy) (HCC)    a.) R/LHC 12/14/2009: normal cors; LVEDP 18 mmHg, mean PA 29 mmHg, mean PCWP 31 mmHg; CO 6 L/min; CI 2.54 L/min/m. b.) TTE 12/10/2014: EF 25%. c.) R/LHC 12/11/2014: mean RA 9 mmHg, mean PA 22 mmHg, mean PCWP 22 mmHg. d.) TTE 06/23/2018: EF 20-25%. e.) TTE 03/01/2019: EF 15%. f.) TTE 02/27/2020: < 20%. g.) TTE 11/23/2020: EF <20%. h.) TTE 05/14/2021: < 15%.   NSTEMI (non-ST elevated myocardial infarction) (HCC)    a.) x 3 per patient report ---> 2012, 2014, 2016   Obesity    On supplemental oxygen by nasal cannula    a.) 2-3 L/[place] PRN   OSA on CPAP    PAF (paroxysmal atrial fibrillation) (HCC)    a.) CHA2DS2-VASc = 4 (HFrEF, HTN, prior MI, T2DM). b.) rate/rhythm maintained without pharmacologial intervention; no current anticoagulation.   Pancreatitis    PSVT (paroxysmal supraventricular tachycardia) [facility])     Past Surgical History:  Procedure Laterality Date   BIOPSY  11/25/2021   Procedure: BIOPSY;  Surgeon: Meridee Score Netty Starring., MD;  Location: [facility] ENDOSCOPY;  Service: Gastroenterology;;   CARDIAC CATHETERIZATION  12/11/2014   Procedure: RIGHT/LEFT  HEART CATH AND CORONARY ANGIOGRAPHY;  Surgeon: Runell Gess, MD;  Location: [facility] CATH LAB;  Service: Cardiovascular;;   COLONOSCOPY WITH PROPOFOL N/A 11/13/2021   Procedure: COLONOSCOPY WITH PROPOFOL;  Surgeon: Toney Reil, MD;  Location: [facility] ENDOSCOPY;  Service: Gastroenterology;  Laterality: N/A;   ESOPHAGOGASTRODUODENOSCOPY (EGD) WITH PROPOFOL N/A 11/25/2021   Procedure: ESOPHAGOGASTRODUODENOSCOPY (EGD) WITH PROPOFOL;  Surgeon: Meridee Score Netty Starring., MD;  Location: [facility] ENDOSCOPY;  Service: Gastroenterology;   Laterality: N/A;   ICD LEAD REMOVAL N/A 03/30/2015   Procedure: ICD LEAD REMOVAL;  Surgeon: Sharion Settler, MD;  Location: [facility] ORS;  Service: Cardiovascular;  Laterality: N/A;   IMPLANTABLE CARDIOVERTER DEFIBRILLATOR IMPLANT     INCISION AND DRAINAGE ABSCESS N/A 12/12/2020   Procedure: INCISION AND DRAINAGE ABSCESS;  Surgeon: Leafy Ro, MD;  Location: [facility] ORS;  Service: General;  Laterality: N/A;   INSERT / REPLACE / REMOVE PACEMAKER     LEFT HEART CATHETERIZATION WITH CORONARY ANGIOGRAM N/A 12/09/2014   Procedure: LEFT HEART CATHETERIZATION WITH CORONARY ANGIOGRAM;  Surgeon: Kathleene Hazel, MD;  Location: [facility] CATH LAB;  Service: Cardiovascular;  Laterality: N/A;   RIGHT/LEFT HEART CATH AND CORONARY ANGIOGRAPHY Bilateral 12/14/2009   Procedure: RIGHT/LEFT HEART CATH AND CORONARY ANGIOGRAPHY; Location: [facility]; Surgeon: Rudean Hitt, MD   TONSILLECTOMY     as a child , adnoids removed   UPPER ESOPHAGEAL ENDOSCOPIC ULTRASOUND (EUS) N/A 11/25/2021   Procedure: UPPER ESOPHAGEAL ENDOSCOPIC ULTRASOUND (EUS);  Surgeon: Lemar Lofty., MD;  Location: [facility] ENDOSCOPY;  Service: Gastroenterology;  Laterality: N/A;    Medications Prior to Admission  Medication Sig Dispense Refill Last Dose/Taking   allopurinol (ZYLOPRIM) 100 MG tablet Take 100 mg by mouth daily.   11/30/2023 at  9:00 AM   atorvastatin (LIPITOR) 40 MG tablet Take 40 mg by mouth daily.   11/30/2023 Morning   BIMZELX 160 MG/ML pen Inject 2 mLs into the skin as directed. Every 2 weeks   11/26/2023   ENTRESTO 24-26 MG Take 0.5 tablets by mouth 2 (two) times daily.   11/30/2023 at  3:00 PM   Fluticasone-Umeclidin-Vilant (TRELEGY ELLIPTA) 100-62.5-25 MCG/ACT AEPB Inhale 1 puff into the lungs daily. 60 each 11 11/30/2023 Morning   torsemide (DEMADEX) 20 MG tablet Take 40 mg by mouth daily.   11/30/2023 at  3:00 PM   NON FORMULARY Pt uses a cpap nightly      Social History   Socioeconomic History   Marital status: Single     Spouse name: Not on file   Number of children: 0   Years of education: Not on file   Highest education level: Some college, no degree  Occupational History   Occupation: unemployed  Tobacco Use   Smoking status: Former    Current packs/day: 0.00    Average packs/day: 1.5 packs/day for 34.0 years (51.0 ttl pk-yrs)    Types: Cigarettes    Quit date: 05/2023    Years since quitting: 0.5    Passive exposure: Past   Smokeless tobacco: Never   Tobacco comments:    Quit smoking 05/2023  Vaping Use   Vaping status: Never Used  Substance and Sexual Activity   Alcohol use: No   Drug use: No   Sexual activity: Yes  Other Topics Concern   Not on file  Social History Narrative   Live alone   Social Drivers of Health   Financial Resource Strain: Medium Risk (12/02/2023)   Overall Financial Resource Strain (CARDIA)    Difficulty of Paying  Living Expenses: Somewhat hard  Food Insecurity: No Food Insecurity (12/02/2023)   Hunger Vital Sign    Worried About Running Out of Food in the Last Year: Never true    Ran Out of Food in the Last Year: Never true  Recent Concern: Food Insecurity - Food Insecurity Present (11/23/2023)   Received from [facility]   Hunger Vital Sign    Worried About Running Out of Food in the Last Year: Sometimes true    Ran Out of Food in the Last Year: Sometimes true  Transportation Needs: No Transportation Needs (12/02/2023)   PRAPARE - Administrator, Civil Service (Medical): No    Lack of Transportation (Non-Medical): No  Physical Activity: Not on file  Stress: Not on file  Social Connections: Unknown (12/02/2023)   Social Connection and Isolation Panel [NHANES]    Frequency of Communication with Friends and Family: Twice a week    Frequency of Social Gatherings with Friends and Family: Once a week    Attends Religious Services: More than 4 times per year    Active Member of [employer] or Organizations: No    Attends Banker  Meetings: Never    Marital Status: Patient declined  Catering manager Violence: Not At Risk (12/02/2023)   Humiliation, Afraid, Rape, and Kick questionnaire    Fear of Current or Ex-Partner: No    Emotionally Abused: No    Physically Abused: No    Sexually Abused: No    Family History  Problem Relation Age of Onset   Hypertension Mother    Congestive Heart Failure Mother    Hypertension Sister    Diabetes Sister    Pancreatitis Sister    COPD Sister    Emphysema Sister        smoked   Pancreatitis Brother    Anemia Neg Hx    Arrhythmia Neg Hx    Asthma Neg Hx    Clotting disorder Neg Hx    Fainting Neg Hx    Heart attack Neg Hx    Heart disease Neg Hx    Heart failure Neg Hx    Hyperlipidemia Neg Hx      Vitals:   12/03/23 0500 12/03/23 0529 12/03/23 0730 12/03/23 0902  BP:  101/69  105/83  Pulse:  74  74  Resp:  20    Temp:  97.9 F (36.6 C)  98.1 F (36.7 C)  TempSrc:      SpO2:  98% 94% 94%  Weight: (!) 138.4 kg     Height:        PHYSICAL EXAM General: Chronically ill-appearing male, well nourished, in no acute distress. HEENT: Normocephalic and atraumatic. Neck: No JVD.  Lungs: Normal respiratory effort on room air. Rhonchi bilaterally.  Heart: HRRR. Normal S1 and S2 without gallops or murmurs.  Abdomen: Non-distended appearing.  Msk: Normal strength and tone for age. Extremities: Warm and well perfused. No clubbing, cyanosis. No edema.  Neuro: Alert and oriented X 3. Psych: Answers questions appropriately.   Labs: Basic Metabolic Panel: Recent Labs    12/02/23 0638 12/03/23 0432  NA 132* 135  K 4.1 3.9  CL 99 100  CO2 22 23  GLUCOSE 374* 246*  BUN 57* 50*  CREATININE 1.87* 1.47*  CALCIUM 8.9 9.0   Liver Function Tests: Recent Labs    12/02/23 0638  AST 33  ALT 34  ALKPHOS 176*  BILITOT 0.6  PROT 8.3*  ALBUMIN 3.3*  No results for input(s): "LIPASE", "AMYLASE" in the last 72 hours. CBC: Recent Labs    12/01/23 0831  12/02/23 0638  WBC 5.0 6.2  HGB 13.2 12.8*  HCT 37.6* 36.1*  MCV 92.8 90.7  PLT 144* 141*   Cardiac Enzymes: Recent Labs    12/01/23 0831 12/01/23 1310  TROPONINIHS 92* 74*   BNP: Recent Labs    12/01/23 0840  BNP 319.6*   D-Dimer: No results for input(s): "DDIMER" in the last 72 hours. Hemoglobin A1C: No results for input(s): "HGBA1C" in the last 72 hours. Fasting Lipid Panel: No results for input(s): "CHOL", "HDL", "LDLCALC", "TRIG", "CHOLHDL", "LDLDIRECT" in the last 72 hours. Thyroid Function Tests: No results for input(s): "TSH", "T4TOTAL", "T3FREE", "THYROIDAB" in the last 72 hours.  Invalid input(s): "FREET3" Anemia Panel: No results for input(s): "VITAMINB12", "FOLATE", "FERRITIN", "TIBC", "IRON", "RETICCTPCT" in the last 72 hours.   Radiology: ECHOCARDIOGRAM COMPLETE Result Date: 12/02/2023    ECHOCARDIOGRAM REPORT   Patient Name:   FORTINO HAAG Date of Exam: 12/01/2023 Medical Rec #:  [id number]        Height:       65.5 in Accession #:    [id number]       Weight:       308.6 lb Date of Birth:  10-Oct-1975       BSA:          2.392 m Patient Age:    47 years         BP:           133/108 mmHg Patient Gender: M                HR:           68 bpm. Exam Location:  [facility] Procedure: 2D Echo, Cardiac Doppler, Color Doppler and Intracardiac            Opacification Agent (Both Spectral and Color Flow Doppler were            utilized during procedure). Indications:     I50.21 Acute Systolic Heart Failure  History:         Patient has prior history of Echocardiogram examinations, most                  recent 10/20/2022. Defibrillator, COPD, Arrythmias:LBBB,                  Signs/Symptoms:Dyspnea; Risk Factors:Hypertension, Diabetes and                  Dyslipidemia. Nonischemic cardiomyopathy. Obstructive sleep                  apnea-CPAP.  Sonographer:     Daphine Deutscher RDCS Referring Phys:  [id number] Francoise Schaumann [place] Diagnosing Phys: Marcina Millard MD IMPRESSIONS  1.  Left ventricular ejection fraction, by estimation, is 30 to 35%. The left ventricle has moderately decreased function. The left ventricle demonstrates regional wall motion abnormalities (see scoring diagram/findings for description). The left ventricular internal cavity size was moderately dilated. Left ventricular diastolic parameters are indeterminate.  2. Right ventricular systolic function is normal. The right ventricular size is normal.  3. The mitral valve is normal in structure. Mild mitral valve regurgitation. No evidence of mitral stenosis.  4. The aortic valve is normal in structure. Aortic valve regurgitation is not visualized. No aortic stenosis is present.  5. The inferior vena cava is normal in size with greater than 50% respiratory variability, suggesting  right atrial pressure of 3 mmHg. FINDINGS  Left Ventricle: Left ventricular ejection fraction, by estimation, is 30 to 35%. The left ventricle has moderately decreased function. The left ventricle demonstrates regional wall motion abnormalities. Definity contrast agent was given IV to delineate the left ventricular endocardial borders. Strain was performed and the global longitudinal strain is indeterminate. The left ventricular internal cavity size was moderately dilated. There is no left ventricular hypertrophy. Left ventricular diastolic parameters are indeterminate.  LV Wall Scoring: The apical lateral segment and apex are akinetic. The mid anterolateral segment and apical septal segment are hypokinetic. Right Ventricle: The right ventricular size is normal. No increase in right ventricular wall thickness. Right ventricular systolic function is normal. Left Atrium: Left atrial size was normal in size. Right Atrium: Right atrial size was normal in size. Pericardium: There is no evidence of pericardial effusion. Mitral Valve: The mitral valve is normal in structure. Mild mitral valve regurgitation. No evidence of mitral valve stenosis. Tricuspid  Valve: The tricuspid valve is normal in structure. Tricuspid valve regurgitation is mild . No evidence of tricuspid stenosis. Aortic Valve: The aortic valve is normal in structure. Aortic valve regurgitation is not visualized. No aortic stenosis is present. Pulmonic Valve: The pulmonic valve was normal in structure. Pulmonic valve regurgitation is not visualized. No evidence of pulmonic stenosis. Aorta: The aortic root is normal in size and structure. Venous: The inferior vena cava is normal in size with greater than 50% respiratory variability, suggesting right atrial pressure of 3 mmHg. IAS/Shunts: No atrial level shunt detected by color flow Doppler. Additional Comments: 3D was performed not requiring image post processing on an independent workstation and was indeterminate.  LEFT VENTRICLE PLAX 2D LVIDd:         8.20 cm Diastology LVIDs:         6.80 cm LV e' medial:    5.38 cm/s LV PW:         0.90 cm LV E/e' medial:  9.9 LV IVS:        0.80 cm LV e' lateral:   6.36 cm/s                        LV E/e' lateral: 8.4  RIGHT VENTRICLE             IVC RV Basal diam:  4.70 cm     IVC diam: 1.70 cm RV S prime:     12.60 cm/s TAPSE (M-mode): 2.7 cm LEFT ATRIUM              Index        RIGHT ATRIUM           Index LA diam:        6.40 cm  2.68 cm/m   RA Area:     15.60 cm LA Vol (A2C):   83.0 ml  34.70 ml/m  RA Volume:   45.30 ml  18.94 ml/m LA Vol (A4C):   113.0 ml 47.24 ml/m LA Biplane Vol: 99.5 ml  41.59 ml/m  AORTIC VALVE LVOT Vmax:   94.47 cm/s LVOT Vmean:  59.633 cm/s LVOT VTI:    0.159 m  AORTA Ao Root diam: 3.70 cm Ao Asc diam:  4.20 cm MITRAL VALVE MV Area (PHT): 4.18 cm    SHUNTS MV Decel Time: 182 msec    Systemic VTI: 0.16 m MV E velocity: 53.55 cm/s MV A velocity: 86.20 cm/s MV E/A ratio:  0.62 Marcina Millard MD  Electronically signed by Marcina Millard MD Signature Date/Time: 12/02/2023/1:40:39 PM    Final    DG Chest 2 View Result Date: 12/01/2023 CLINICAL DATA:  Cough. EXAM: CHEST - 2  VIEW COMPARISON:  Chest radiograph dated 11/07/2023. FINDINGS: Stable cardiomegaly and central pulmonary vascular congestion. Stable left-sided pacemaker. No evidence of overt edema. No focal consolidation, pleural effusion, or pneumothorax. No acute osseous abnormality. IMPRESSION: Stable cardiomegaly with central pulmonary vascular congestion. Electronically Signed   By: Hart Robinsons M.D.   On: 12/01/2023 09:51   DG Elbow Complete Right Result Date: 11/07/2023 CLINICAL DATA:  pain EXAM: RIGHT ELBOW - COMPLETE 3+ VIEW COMPARISON:  None Available. FINDINGS: No acute fracture or dislocation. Joint spaces and alignment are maintained. No area of erosion or osseous destruction. No unexpected radiopaque foreign body. Soft tissues are unremarkable. IMPRESSION: No acute fracture or dislocation. Electronically Signed   By: Meda Klinefelter M.D.   On: 11/07/2023 16:31   DG Chest 2 View Result Date: 11/07/2023 CLINICAL DATA:  Chest pain for 3 days. EXAM: CHEST - 2 VIEW COMPARISON:  June 12, 2023. FINDINGS: Stable cardiomegaly. Left-sided defibrillator is unchanged. Both lungs are clear. The visualized skeletal structures are unremarkable. IMPRESSION: No active cardiopulmonary disease. Electronically Signed   By: Lupita Raider M.D.   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 male  with a past  medical history of chronic HFrEF, nonischemic cardiomyopathy, LBBB s/p CRT-D, OSA, severe COPD (home O2 2-3L), CKD, type 2 diabetes mellitus, hyperlipidemia, hypertension, paroxysmal atrial fibrillation who presented to the ED on 12/01/2023 for worsening shortness of breath. Cardiology was consulted for further evaluation.   # Acute on chronic HFrEF (EF < 20% 10/20/2022) # Nonischemic Cardiomyopathy, LBBB s/p CRT-D  # COPD exacerbation Patient reports worsening SOB for the past 2 weeks, endorses cough, and denies any lower extremity swelling.  Patient reports that his shortness of breath has improved since presenting to the ED. BNP 319. Troponin trending flat 92 > 74. Patient on 4L O2 [place] with baseline at 2-3L at home. S/p 1 dose of IV lasix in the ED. echo this admission with EF 30-35%. -Resume home torsemide.  Switch home Invokana to Farxiga 10 mg daily. -Start low dose metoprolol succinate 12.5 mg daily. Continue home Entresto 24-26 mg twice daily.  -Manage COPD per primary. -Continue atorvastatin 40 mg daily -Mildly elevated and flat trending troponin likely demand in the setting of acute heart failure and COPD exacerbation. No plan for further cardiac invasive evaluation.   # Paroxysmal atrial fibrillation -Beta-blocker as above. -Patient refuses to take Eliquis or any other blood thinners.  Educated on risk associated with not taking Eliquis and having atrial fibrillation.  Ok for discharge today from a cardiac perspective. Will arrange for follow up in clinic with Dr. Juliann Pares in 1-2 weeks.    This patient's plan of  care was discussed and created with Dr. Darrold Junker and he is in agreement.  Signed: Gale Journey, PA-C  12/03/2023, 9:34 AM [facility] Cardiology

## 2023-12-03 NOTE — Progress Notes (Signed)
 Heart Failure Stewardship Pharmacy Note  PCP: Preston Fleeting, MD PCP-Cardiologist: None  HPI: Oscar [name] is a 48 y.o. 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 On admission, BNP was 319.6, HS-troponin was 92 > 74. Chest x-ray noted central pulmonary vascular congestion.   Pertinent cardiac history: LHC in 2011 and 2016 with normal coronaries. Echo in 12/2014 showed LVEF of 25%. CRT-D placed in 2016. Echo in 06/2018 showed LVEF 20-25%. Echo 01/2020 with LVEF further reduced to <20%. Echo 10/2020 with LVEF unchanged and mdoerately reduced RV function. Echo 10/2022 with LVEF unchanged, normal RV function, mild MR, mild AR.  Pertinent Lab Values: Creatinine  Date Value Ref Range Status  08/22/2014 1.08 0.60 - 1.30 mg/dL Final   Creatinine, Ser  Date Value Ref Range Status  12/03/2023 1.47 (H) 0.61 - 1.24 mg/dL Final   BUN  Date Value Ref Range Status  12/03/2023 50 (H) 6 - 20 mg/dL Final  16/06/9603 11 4 - 21 mg/dL Final  54/05/8118 21 (H) 7 - 18 mg/dL Final   Potassium  Date Value Ref Range Status  12/03/2023 3.9 3.5 - 5.1 mmol/L Final  08/22/2014 4.1 3.5 - 5.1 mmol/L Final   Sodium  Date Value Ref Range Status  12/03/2023 135 135 - 145 mmol/L Final  10/18/2015 140 137 - 147 mmol/L Final  08/22/2014 135 (L) 136 - 145 mmol/L Final   B Natriuretic Peptide  Date Value Ref Range Status  12/01/2023 319.6 (H) 0.0 - 100.0 pg/mL Final    Comment:    Performed at [facility], [address] [place], [place] [address]   Magnesium  Date Value Ref Range Status  10/26/2023 2.1 1.7 - 2.4 mg/dL Final    Comment:    Performed at [facility], [address] [place], [place] [address]  02/07/2012 1.7 (L) mg/dL Final    Comment:     3.0-8.6 THERAPEUTIC RANGE: 4-7 mg/dL TOXIC: > 10 mg/dL  -----------------------    Hemoglobin A1C  Date Value Ref Range Status  10/18/2015 7.5  Final   Hgb A1c MFr Bld  Date Value Ref Range Status  06/13/2023 7.3 (H) 4.8 - 5.6 % Final    Comment:    (NOTE) Pre diabetes:          5.7%-6.4%  Diabetes:              >6.4%  Glycemic control for   <7.0% adults with diabetes    TSH  Date Value Ref Range Status  11/24/2020 2.250 0.350 - 4.500 uIU/mL Final    Comment:    Performed by a 3rd Generation assay with a functional sensitivity of <=0.01 uIU/mL. Performed at [facility], [address] [place], [place] [address]   12/10/2014 0.648 0.350 - 4.500 uIU/mL Final    Vital Signs: Admission weight: 308.64 lbs Temp:  [97.3 F (36.3 C)-97.9 F (36.6 C)] 97.9 F (36.6 C) (04/03 0529) Pulse Rate:  [60-88] 74 (04/03 0529) Cardiac Rhythm: Normal sinus rhythm;Ventricular tachycardia (04/02 1900) Resp:  [11-21] 20 (04/03 0529) BP: (101-138)/(49-98) 101/69 (04/03 0529) SpO2:  [91 %-98 %] 94 % (04/03 0730) Weight:  [138.4 kg (305 lb 1.9 oz)] 138.4 kg (305 lb 1.9 oz) (04/03 0500)  Intake/Output Summary (Last 24 hours) at 12/03/2023 0743 Last data filed at 12/02/2023 1900 Gross  per 24 hour  Intake 240 ml  Output --  Net 240 ml    Current Heart Failure Medications:  Loop diuretic: torsemide 40 mg daily  Beta-Blocker: metoprolol succinate 12.5 mg daily ACEI/ARB/ARNI: Entresto 24-26 mg BID MRA: none SGLT2i: none Other: none  Prior to admission Heart Failure Medications:  Loop diuretic: torsemide 20 mg BID Beta-Blocker: none ACEI/ARB/ARNI: Entresto 24-26 mg BID MRA: none SGLT2i: canagliflozin 100 mg daily Other: none  Assessment: 1. Acute on chronic systolic heart failure (LVEF 30-35%)  , due to presumed NICM. NYHA class III symptoms.  -Symptoms: Shortness of breath has improved, but still with dyspnea on exertion. Reports appetite is fine. Still with some  orthopnea. -Volume: Appears euvolemic. Urine output not documented. Weight down 3 lbs from admission. Creatinine is improved. Home torsemide 20 mg BID changed to torsemide 40 mg daily. -Hemodynamics: BP is elevated. HR is 70s. -BB: Previously on carvedilol, but stopped due to hypotension. Metoprolol succinate 12.5 mg daily started. -ACEI/ARB/ARNI: Continue Entresto 24-26 mg BID -MRA: Previously on spironolactone, but stopped due to ?hypotension? Can consider finerenone outpatient given CKD and DM. -SGLT2i: Canagliflozin changed to dapagliflozin due to more robust data in CHF. Patient has tried Jardiance in the past and it made him dizzy.  Plan: 1) Medication changes recommended at this time: -None  2) Patient assistance: -Marcelline Deist is $0  3) Education: - Patient has been educated on current HF medications and potential additions to HF medication regimen - Patient verbalizes understanding that over the next few months, these medication doses may change and more medications may be added to optimize HF regimen - Patient has been educated on basic disease state pathophysiology and goals of therapy  Medication Assistance / Insurance Benefits Check: Does the patient have prescription insurance?    Type of insurance plan:  Does the patient qualify for medication assistance through manufacturers or grants? Pending  Outpatient Pharmacy: Prior to admission outpatient pharmacy: [facility]      Please do not hesitate to reach out with questions or concerns,  Enos Fling, PharmD, CPP, BCPS Heart Failure Pharmacist  Phone - [phone] 12/03/2023 7:43 AM

## 2023-12-03 NOTE — Plan of Care (Signed)
  Problem: Education: Goal: Individualized Educational Video(s) Outcome: Progressing   Problem: Coping: Goal: Ability to adjust to condition or change in health will improve Outcome: Progressing   Problem: Fluid Volume: Goal: Ability to maintain a balanced intake and output will improve Outcome: Progressing   Problem: Health Behavior/Discharge Planning: Goal: Ability to identify and utilize available resources and services will improve Outcome: Progressing Goal: Ability to manage health-related needs will improve Outcome: Progressing

## 2023-12-03 NOTE — Progress Notes (Signed)
 AVS given and reviewed with patient. PIV removed. All questions answered

## 2023-12-04 ENCOUNTER — Other Ambulatory Visit: Payer: Self-pay

## 2023-12-04 ENCOUNTER — Inpatient Hospital Stay
Admission: EM | Admit: 2023-12-04 | Discharge: 2023-12-08 | DRG: 177 | Disposition: A | Discharge status: Home / Self Care | Attending: Internal Medicine | Admitting: Internal Medicine

## 2023-12-04 DIAGNOSIS — I447 Left bundle-branch block, unspecified: Secondary | ICD-10-CM | POA: Diagnosis present

## 2023-12-04 DIAGNOSIS — I130 Hypertensive heart and chronic kidney disease with heart failure and stage 1 through stage 4 chronic kidney disease, or unspecified chronic kidney disease: Secondary | ICD-10-CM | POA: Diagnosis present

## 2023-12-04 DIAGNOSIS — E871 Hypo-osmolality and hyponatremia: Secondary | ICD-10-CM | POA: Insufficient documentation

## 2023-12-04 DIAGNOSIS — Z91041 Radiographic dye allergy status: Secondary | ICD-10-CM

## 2023-12-04 DIAGNOSIS — Z79899 Other long term (current) drug therapy: Secondary | ICD-10-CM

## 2023-12-04 DIAGNOSIS — E1165 Type 2 diabetes mellitus with hyperglycemia: Secondary | ICD-10-CM | POA: Diagnosis present

## 2023-12-04 DIAGNOSIS — I420 Dilated cardiomyopathy: Secondary | ICD-10-CM | POA: Diagnosis present

## 2023-12-04 DIAGNOSIS — E8882 Obesity due to disruption of MC4R pathway: Secondary | ICD-10-CM | POA: Diagnosis present

## 2023-12-04 DIAGNOSIS — R000 Tachycardia, unspecified: Secondary | ICD-10-CM | POA: Diagnosis not present

## 2023-12-04 DIAGNOSIS — Z9981 Dependence on supplemental oxygen: Secondary | ICD-10-CM

## 2023-12-04 DIAGNOSIS — I252 Old myocardial infarction: Secondary | ICD-10-CM

## 2023-12-04 DIAGNOSIS — Z7901 Long term (current) use of anticoagulants: Secondary | ICD-10-CM

## 2023-12-04 DIAGNOSIS — Z794 Long term (current) use of insulin: Secondary | ICD-10-CM

## 2023-12-04 DIAGNOSIS — N1831 Chronic kidney disease, stage 3a: Secondary | ICD-10-CM | POA: Diagnosis present

## 2023-12-04 DIAGNOSIS — E785 Hyperlipidemia, unspecified: Secondary | ICD-10-CM | POA: Diagnosis present

## 2023-12-04 DIAGNOSIS — Z560 Unemployment, unspecified: Secondary | ICD-10-CM

## 2023-12-04 DIAGNOSIS — J9621 Acute and chronic respiratory failure with hypoxia: Secondary | ICD-10-CM | POA: Diagnosis present

## 2023-12-04 DIAGNOSIS — Z87891 Personal history of nicotine dependence: Secondary | ICD-10-CM

## 2023-12-04 DIAGNOSIS — N179 Acute kidney failure, unspecified: Secondary | ICD-10-CM | POA: Diagnosis not present

## 2023-12-04 DIAGNOSIS — Z5986 Financial insecurity: Secondary | ICD-10-CM

## 2023-12-04 DIAGNOSIS — E1122 Type 2 diabetes mellitus with diabetic chronic kidney disease: Secondary | ICD-10-CM | POA: Diagnosis present

## 2023-12-04 DIAGNOSIS — Z95810 Presence of automatic (implantable) cardiac defibrillator: Secondary | ICD-10-CM

## 2023-12-04 DIAGNOSIS — I493 Ventricular premature depolarization: Secondary | ICD-10-CM | POA: Diagnosis present

## 2023-12-04 DIAGNOSIS — Z6841 Body Mass Index (BMI) 40.0 and over, adult: Secondary | ICD-10-CM

## 2023-12-04 DIAGNOSIS — E662 Morbid (severe) obesity with alveolar hypoventilation: Secondary | ICD-10-CM | POA: Diagnosis present

## 2023-12-04 DIAGNOSIS — Z833 Family history of diabetes mellitus: Secondary | ICD-10-CM

## 2023-12-04 DIAGNOSIS — U071 COVID-19: Secondary | ICD-10-CM | POA: Diagnosis not present

## 2023-12-04 DIAGNOSIS — I480 Paroxysmal atrial fibrillation: Secondary | ICD-10-CM | POA: Diagnosis present

## 2023-12-04 DIAGNOSIS — Z8249 Family history of ischemic heart disease and other diseases of the circulatory system: Secondary | ICD-10-CM

## 2023-12-04 DIAGNOSIS — I5A Non-ischemic myocardial injury (non-traumatic): Secondary | ICD-10-CM | POA: Diagnosis present

## 2023-12-04 DIAGNOSIS — R7989 Other specified abnormal findings of blood chemistry: Secondary | ICD-10-CM | POA: Insufficient documentation

## 2023-12-04 DIAGNOSIS — I2510 Atherosclerotic heart disease of native coronary artery without angina pectoris: Secondary | ICD-10-CM | POA: Diagnosis present

## 2023-12-04 DIAGNOSIS — Z825 Family history of asthma and other chronic lower respiratory diseases: Secondary | ICD-10-CM

## 2023-12-04 DIAGNOSIS — N492 Inflammatory disorders of scrotum: Secondary | ICD-10-CM | POA: Diagnosis present

## 2023-12-04 DIAGNOSIS — I5023 Acute on chronic systolic (congestive) heart failure: Secondary | ICD-10-CM | POA: Diagnosis present

## 2023-12-04 DIAGNOSIS — M109 Gout, unspecified: Secondary | ICD-10-CM | POA: Diagnosis present

## 2023-12-04 DIAGNOSIS — R0602 Shortness of breath: Secondary | ICD-10-CM

## 2023-12-04 DIAGNOSIS — J441 Chronic obstructive pulmonary disease with (acute) exacerbation: Principal | ICD-10-CM | POA: Diagnosis present

## 2023-12-04 DIAGNOSIS — J9622 Acute and chronic respiratory failure with hypercapnia: Secondary | ICD-10-CM | POA: Diagnosis present

## 2023-12-04 DIAGNOSIS — H9191 Unspecified hearing loss, right ear: Secondary | ICD-10-CM | POA: Diagnosis present

## 2023-12-04 DIAGNOSIS — I2489 Other forms of acute ischemic heart disease: Secondary | ICD-10-CM | POA: Diagnosis present

## 2023-12-04 DIAGNOSIS — K219 Gastro-esophageal reflux disease without esophagitis: Secondary | ICD-10-CM | POA: Diagnosis present

## 2023-12-04 DIAGNOSIS — E66813 Obesity, class 3: Secondary | ICD-10-CM | POA: Diagnosis present

## 2023-12-04 DIAGNOSIS — Z888 Allergy status to other drugs, medicaments and biological substances status: Secondary | ICD-10-CM

## 2023-12-04 LAB — COMPREHENSIVE METABOLIC PANEL WITH GFR
ALT: 50 U/L — ABNORMAL HIGH (ref 0–44)
AST: 48 U/L — ABNORMAL HIGH (ref 15–41)
Albumin: 3.6 g/dL (ref 3.5–5.0)
Alkaline Phosphatase: 150 U/L — ABNORMAL HIGH (ref 38–126)
Anion gap: 10 (ref 5–15)
BUN: 53 mg/dL — ABNORMAL HIGH (ref 6–20)
CO2: 26 mmol/L (ref 22–32)
Calcium: 8.9 mg/dL (ref 8.9–10.3)
Chloride: 101 mmol/L (ref 98–111)
Creatinine, Ser: 1.86 mg/dL — ABNORMAL HIGH (ref 0.61–1.24)
GFR, Estimated: 44 mL/min — ABNORMAL LOW (ref >60)
Glucose, Bld: 215 mg/dL — ABNORMAL HIGH (ref 70–99)
Potassium: 3.9 mmol/L (ref 3.5–5.1)
Sodium: 137 mmol/L (ref 135–145)
Total Bilirubin: 0.6 mg/dL (ref 0.0–1.2)
Total Protein: 8.1 g/dL (ref 6.5–8.1)

## 2023-12-04 LAB — CBC
HCT: 39.1 % (ref 39.0–52.0)
Hemoglobin: 13.5 g/dL (ref 13.0–17.0)
MCH: 32.1 pg (ref 26.0–34.0)
MCHC: 34.5 g/dL (ref 30.0–36.0)
MCV: 92.9 fL (ref 80.0–100.0)
Platelets: 161 10*3/uL (ref 150–400)
RBC: 4.21 MIL/uL — ABNORMAL LOW (ref 4.22–5.81)
RDW: 12.6 % (ref 11.5–15.5)
WBC: 8.9 10*3/uL (ref 4.0–10.5)
nRBC: 0 % (ref 0.0–0.2)

## 2023-12-04 LAB — RESP PANEL BY RT-PCR (RSV, FLU A&B, COVID)  RVPGX2
Influenza A by PCR: NEGATIVE
Influenza B by PCR: NEGATIVE
Resp Syncytial Virus by PCR: NEGATIVE
SARS Coronavirus 2 by RT PCR: POSITIVE — AB

## 2023-12-04 LAB — CBG MONITORING, ED: Glucose-Capillary: 431 mg/dL — ABNORMAL HIGH (ref 70–99)

## 2023-12-04 LAB — D-DIMER, QUANTITATIVE: D-Dimer, Quant: 0.47 ug{FEU}/mL (ref 0.00–0.50)

## 2023-12-04 LAB — BRAIN NATRIURETIC PEPTIDE: B Natriuretic Peptide: 455.1 pg/mL — ABNORMAL HIGH (ref 0.0–100.0)

## 2023-12-04 LAB — GLUCOSE, CAPILLARY: Glucose-Capillary: 342 mg/dL — ABNORMAL HIGH (ref 70–99)

## 2023-12-04 LAB — BLOOD GAS, VENOUS
Acid-Base Excess: 4.2 mmol/L — ABNORMAL HIGH (ref 0.0–2.0)
Bicarbonate: 29.2 mmol/L — ABNORMAL HIGH (ref 20.0–28.0)
O2 Saturation: 89.3 %
Patient temperature: 37
pCO2, Ven: 44 mmHg (ref 44–60)
pH, Ven: 7.43 (ref 7.25–7.43)
pO2, Ven: 55 mmHg — ABNORMAL HIGH (ref 32–45)

## 2023-12-04 LAB — TROPONIN I (HIGH SENSITIVITY)
Troponin I (High Sensitivity): 121 ng/L (ref <18)
Troponin I (High Sensitivity): 130 ng/L (ref <18)
Troponin I (High Sensitivity): 137 ng/L (ref <18)

## 2023-12-04 MED ORDER — ASPIRIN 81 MG PO CHEW
324.0000 mg | CHEWABLE_TABLET | Freq: Once | ORAL | Status: AC
  Administered 2023-12-04: 324 mg via ORAL
  Filled 2023-12-04: qty 4

## 2023-12-04 MED ORDER — ALLOPURINOL 100 MG PO TABS
100.0000 mg | ORAL_TABLET | Freq: Every day | ORAL | Status: DC
  Administered 2023-12-04 – 2023-12-08 (×5): 100 mg via ORAL
  Filled 2023-12-04 (×5): qty 1

## 2023-12-04 MED ORDER — IPRATROPIUM-ALBUTEROL 20-100 MCG/ACT IN AERS
1.0000 | INHALATION_SPRAY | Freq: Four times a day (QID) | RESPIRATORY_TRACT | Status: DC
Start: 2023-12-04 — End: 2023-12-04
  Filled 2023-12-04: qty 4

## 2023-12-04 MED ORDER — METHYLPREDNISOLONE SODIUM SUCC 125 MG IJ SOLR
125.0000 mg | Freq: Once | INTRAMUSCULAR | Status: AC
  Administered 2023-12-04: 125 mg via INTRAVENOUS
  Filled 2023-12-04: qty 2

## 2023-12-04 MED ORDER — ONDANSETRON HCL 4 MG PO TABS: 4.0000 mg | ORAL_TABLET | Freq: Four times a day (QID) | ORAL | Status: DC | PRN

## 2023-12-04 MED ORDER — APIXABAN 5 MG PO TABS
5.0000 mg | ORAL_TABLET | Freq: Two times a day (BID) | ORAL | Status: DC
  Administered 2023-12-04 – 2023-12-08 (×9): 5 mg via ORAL
  Filled 2023-12-04 (×9): qty 1

## 2023-12-04 MED ORDER — METOPROLOL TARTRATE 5 MG/5ML IV SOLN
5.0000 mg | Freq: Once | INTRAVENOUS | Status: AC
  Administered 2023-12-04: 5 mg via INTRAVENOUS
  Filled 2023-12-04: qty 5

## 2023-12-04 MED ORDER — TORSEMIDE 20 MG PO TABS: 40.0000 mg | ORAL_TABLET | Freq: Every day | ORAL | Status: DC

## 2023-12-04 MED ORDER — INSULIN ASPART 100 UNIT/ML IJ SOLN
0.0000 [IU] | Freq: Three times a day (TID) | INTRAMUSCULAR | Status: DC
  Administered 2023-12-04: 20 [IU] via SUBCUTANEOUS
  Filled 2023-12-04: qty 1

## 2023-12-04 MED ORDER — DIGOXIN 0.25 MG/ML IJ SOLN
0.1250 mg | Freq: Four times a day (QID) | INTRAMUSCULAR | Status: DC
Start: 2023-12-04 — End: 2023-12-04
  Filled 2023-12-04: qty 2

## 2023-12-04 MED ORDER — GUAIFENESIN ER 600 MG PO TB12
1200.0000 mg | ORAL_TABLET | Freq: Two times a day (BID) | ORAL | Status: DC
  Administered 2023-12-04 – 2023-12-08 (×9): 1200 mg via ORAL
  Filled 2023-12-04 (×11): qty 2

## 2023-12-04 MED ORDER — UMECLIDINIUM BROMIDE 62.5 MCG/ACT IN AEPB
1.0000 | INHALATION_SPRAY | Freq: Every day | RESPIRATORY_TRACT | Status: DC
  Administered 2023-12-04 – 2023-12-08 (×4): 1 via RESPIRATORY_TRACT
  Filled 2023-12-04 (×2): qty 7

## 2023-12-04 MED ORDER — INSULIN GLARGINE-YFGN 100 UNIT/ML ~~LOC~~ SOLN
10.0000 [IU] | Freq: Every day | SUBCUTANEOUS | Status: DC
  Administered 2023-12-04 – 2023-12-05 (×2): 10 [IU] via SUBCUTANEOUS
  Filled 2023-12-04 (×3): qty 0.1

## 2023-12-04 MED ORDER — IPRATROPIUM BROMIDE HFA 17 MCG/ACT IN AERS
2.0000 | INHALATION_SPRAY | RESPIRATORY_TRACT | Status: DC
  Administered 2023-12-04 – 2023-12-05 (×2): 2 via RESPIRATORY_TRACT
  Filled 2023-12-04: qty 12.9

## 2023-12-04 MED ORDER — PREDNISONE 20 MG PO TABS
40.0000 mg | ORAL_TABLET | Freq: Every day | ORAL | Status: DC
  Administered 2023-12-04 – 2023-12-05 (×2): 40 mg via ORAL
  Filled 2023-12-04 (×2): qty 2

## 2023-12-04 MED ORDER — PANTOPRAZOLE SODIUM 40 MG PO TBEC
40.0000 mg | DELAYED_RELEASE_TABLET | Freq: Every day | ORAL | Status: DC
  Administered 2023-12-04 – 2023-12-08 (×5): 40 mg via ORAL
  Filled 2023-12-04 (×5): qty 1

## 2023-12-04 MED ORDER — INSULIN ASPART 100 UNIT/ML IJ SOLN
0.0000 [IU] | Freq: Every day | INTRAMUSCULAR | Status: DC
  Administered 2023-12-04: 4 [IU] via SUBCUTANEOUS
  Filled 2023-12-04: qty 1

## 2023-12-04 MED ORDER — SACUBITRIL-VALSARTAN 24-26 MG PO TABS
1.0000 | ORAL_TABLET | Freq: Two times a day (BID) | ORAL | Status: DC
  Administered 2023-12-04 – 2023-12-07 (×6): 1 via ORAL
  Filled 2023-12-04 (×8): qty 1

## 2023-12-04 MED ORDER — ONDANSETRON HCL 4 MG/2ML IJ SOLN: 4.0000 mg | Freq: Four times a day (QID) | INTRAMUSCULAR | Status: DC | PRN

## 2023-12-04 MED ORDER — ATORVASTATIN CALCIUM 20 MG PO TABS
40.0000 mg | ORAL_TABLET | Freq: Every day | ORAL | Status: DC
  Administered 2023-12-04 – 2023-12-08 (×5): 40 mg via ORAL
  Filled 2023-12-04 (×5): qty 2

## 2023-12-04 MED ORDER — FLUTICASONE FUROATE-VILANTEROL 100-25 MCG/ACT IN AEPB
1.0000 | INHALATION_SPRAY | Freq: Every day | RESPIRATORY_TRACT | Status: DC
  Administered 2023-12-05 – 2023-12-08 (×4): 1 via RESPIRATORY_TRACT
  Filled 2023-12-04: qty 28

## 2023-12-04 MED ORDER — SODIUM CHLORIDE 0.9 % IV SOLN: 100.0000 mg | Freq: Every day | INTRAVENOUS | Status: DC

## 2023-12-04 MED ORDER — METOPROLOL SUCCINATE ER 25 MG PO TB24
12.5000 mg | ORAL_TABLET | Freq: Every day | ORAL | Status: DC
  Administered 2023-12-04 – 2023-12-06 (×2): 12.5 mg via ORAL
  Filled 2023-12-04 (×3): qty 1

## 2023-12-04 MED ORDER — LEVALBUTEROL TARTRATE 45 MCG/ACT IN AERO: 2.0000 | INHALATION_SPRAY | Freq: Four times a day (QID) | RESPIRATORY_TRACT | Status: DC | PRN

## 2023-12-04 MED ORDER — IPRATROPIUM BROMIDE 0.02 % IN SOLN
0.5000 mg | RESPIRATORY_TRACT | Status: DC
  Administered 2023-12-04 (×2): 0.5 mg via RESPIRATORY_TRACT
  Filled 2023-12-04 (×5): qty 2.5

## 2023-12-04 MED ORDER — FUROSEMIDE 10 MG/ML IJ SOLN
40.0000 mg | Freq: Two times a day (BID) | INTRAMUSCULAR | Status: DC
  Administered 2023-12-04 (×2): 40 mg via INTRAVENOUS
  Filled 2023-12-04 (×2): qty 4

## 2023-12-04 MED ORDER — IPRATROPIUM-ALBUTEROL 0.5-2.5 (3) MG/3ML IN SOLN: 3.0000 mL | Freq: Four times a day (QID) | RESPIRATORY_TRACT | Status: DC

## 2023-12-04 MED ORDER — DAPAGLIFLOZIN PROPANEDIOL 10 MG PO TABS
10.0000 mg | ORAL_TABLET | Freq: Every day | ORAL | Status: DC
  Administered 2023-12-04 – 2023-12-08 (×4): 10 mg via ORAL
  Filled 2023-12-04 (×5): qty 1

## 2023-12-04 MED ORDER — IPRATROPIUM-ALBUTEROL 0.5-2.5 (3) MG/3ML IN SOLN
9.0000 mL | Freq: Once | RESPIRATORY_TRACT | Status: AC
  Administered 2023-12-04: 9 mL via RESPIRATORY_TRACT
  Filled 2023-12-04: qty 3

## 2023-12-04 MED ORDER — SODIUM CHLORIDE 0.9 % IV SOLN
200.0000 mg | Freq: Once | INTRAVENOUS | Status: DC
  Filled 2023-12-04: qty 40

## 2023-12-04 MED ORDER — DIGOXIN 0.25 MG/ML IJ SOLN
0.1250 mg | Freq: Four times a day (QID) | INTRAMUSCULAR | Status: AC
  Administered 2023-12-04 – 2023-12-05 (×3): 0.125 mg via INTRAVENOUS
  Filled 2023-12-04 (×7): qty 2

## 2023-12-04 NOTE — ED Provider Notes (Addendum)
 [facility] Provider Note    Event Date/Time   First MD Initiated Contact with Patient 12/04/23 [phone]     (approximate)   History   Shortness of Breath   HPI  Oscar [name] is a 48 y.o. male past medical history significant for COPD on baseline 3 L of home oxygen, HFrEF, recent hospitalization, who presents to the emergency department shortness of breath.  Patient had a recent hospitalization for an NSTEMI and decompensated heart failure.  Felt that troponin elevation was secondary to HFpEF.  Patient was evaluated by cardiology at that time.  Patient returns to the emergency department for worsening shortness of breath.  Patient was evaluated last night and then again this morning for worsening shortness of breath.  States that he is not feeling well and feeling like he cannot catch a deep breath.  Did not take his steroids today.  Received DuoNebs with EMS.  Mild chest pressure at this time which she feels like is from not being able to breathe.  Denies any history of DVT or PE.  Endorses some nausea earlier today but denies any active nausea at this time.     Physical Exam   Triage Vital Signs: ED Triage Vitals  Encounter Vitals Group     BP 12/04/23 0906 102/68     Systolic BP Percentile --      Diastolic BP Percentile --      Pulse Rate 12/04/23 0906 98     Resp 12/04/23 0906 (!) 21     Temp 12/04/23 0906 (!) 97.2 F (36.2 C)     Temp Source 12/04/23 0906 Oral     SpO2 12/04/23 0859 94 %     Weight 12/04/23 0907 (!) 330 lb 12.8 oz (150 kg)     Height 12/04/23 0907 5\' 5"  (1.651 m)     Head Circumference --      Peak Flow --      Pain Score 12/04/23 0907 0     Pain Loc --      Pain Education --      Exclude from Growth Chart --     Most recent vital signs: Vitals:   12/04/23 0859 12/04/23 0906  BP:  102/68  Pulse:  98  Resp:  (!) 21  Temp:  (!) 97.2 F (36.2 C)  SpO2: 94% 98%    Physical Exam Constitutional:      Appearance: He is  well-developed.  HENT:     Head: Atraumatic.  Eyes:     Conjunctiva/sclera: Conjunctivae normal.     Pupils: Pupils are equal, round, and reactive to light.  Cardiovascular:     Rate and Rhythm: Regular rhythm. Tachycardia present.  Pulmonary:     Effort: Tachypnea, accessory muscle usage and respiratory distress present.     Breath sounds: Wheezing and rhonchi present.  Abdominal:     Palpations: Abdomen is soft.  Musculoskeletal:     Cervical back: Normal range of motion.     Right lower leg: No edema.     Left lower leg: No edema.  Skin:    General: Skin is warm.     Capillary Refill: Capillary refill takes less than 2 seconds.  Neurological:     General: No focal deficit present.     Mental Status: He is alert. Mental status is at baseline.     IMPRESSION / MDM / ASSESSMENT AND PLAN / ED COURSE  I reviewed the triage vital signs and  the nursing notes.  Differential diagnosis including acute on chronic heart failure exacerbation, COPD exacerbation, ACS, anemia, pneumonia, viral illness including COVID/influenza   Low risk Wells criteria will obtain a screening D-dimer to further risk ratified for pulmonary embolism  EKG  I, Corena Herter, the attending physician, personally viewed and interpreted this ECG.  EKG with underlying left bundle branch block.  No significant change when compared to prior.  No findings of acute ischemia or dysrhythmia.  Negative Sgarbossa's criteria. No tachycardic or bradycardic dysrhythmias while on cardiac telemetry.  RADIOLOGY Patient had a chest x-ray done earlier today with cardiomegaly with no signs of pneumonia  LABS (all labs ordered are listed, but only abnormal results are displayed) Labs interpreted as -    Labs Reviewed  RESP PANEL BY RT-PCR (RSV, FLU A&B, COVID)  RVPGX2 - Abnormal; Notable for the following components:      Result Value   SARS Coronavirus 2 by RT PCR POSITIVE (*)    All other components within normal  limits  CBC - Abnormal; Notable for the following components:   RBC 4.21 (*)    All other components within normal limits  COMPREHENSIVE METABOLIC PANEL WITH GFR - Abnormal; Notable for the following components:   Glucose, Bld 215 (*)    BUN 53 (*)    Creatinine, Ser 1.86 (*)    AST 48 (*)    ALT 50 (*)    Alkaline Phosphatase 150 (*)    GFR, Estimated 44 (*)    All other components within normal limits  BLOOD GAS, VENOUS - Abnormal; Notable for the following components:   pO2, Ven 55 (*)    Bicarbonate 29.2 (*)    Acid-Base Excess 4.2 (*)    All other components within normal limits  BRAIN NATRIURETIC PEPTIDE - Abnormal; Notable for the following components:   B Natriuretic Peptide 455.1 (*)    All other components within normal limits  TROPONIN I (HIGH SENSITIVITY) - Abnormal; Notable for the following components:   Troponin I (High Sensitivity) 137 (*)    All other components within normal limits  D-DIMER, QUANTITATIVE (NOT AT [facility])  TROPONIN I (HIGH SENSITIVITY)     MDM  Lab work with no significant leukocytosis.  No significant anemia.  Creatinine appears to be at his baseline.  Hyperglycemia but no signs of DKA.  Normal electrolytes.  COVID testing is positive.  D-dimer is negative I have a low suspicion for pulmonary embolism do not feel that CTA is necessary.  Mildly increased elevation of BNP.  Troponin elevated at 137.  Likely demand ischemia in the setting of COPD/CHF exacerbation secondary to COVID.  Chest pain-free at this time.  Patient was given DuoNeb treatments and IV Solu-Medrol.  On reevaluation continues to have increased work of breathing.  Mild findings concerning for pulmonary edema however has a soft blood pressure we will hold on any further Lasix at this time.  Did have some improvement following DuoNeb treatment.  Continues to have increased work of breathing likely due to COPD exacerbation from COVID infection.  Consulted hospitalist for admission.      PROCEDURES:  Critical Care performed: No  Procedures  Patient's presentation is most consistent with acute presentation with potential threat to life or bodily function.   MEDICATIONS ORDERED IN ED: Medications  aspirin chewable tablet 324 mg (has no administration in time range)  ipratropium-albuterol (DUONEB) 0.5-2.5 (3) MG/3ML nebulizer solution 9 mL (9 mLs Nebulization Given 12/04/23 0946)  methylPREDNISolone sodium succinate (  SOLU-MEDROL) 125 mg/2 mL injection 125 mg (125 mg Intravenous Given 12/04/23 0946)    FINAL CLINICAL IMPRESSION(S) / ED DIAGNOSES   Final diagnoses:  COPD exacerbation (HCC)  SOB (shortness of breath)  COVID     Rx / DC Orders   ED Discharge Orders     None        Note:  This document was prepared using Dragon voice recognition software and may include unintentional dictation errors.   Corena Herter, MD 12/04/23 1103    Corena Herter, MD 12/04/23 1122

## 2023-12-04 NOTE — Plan of Care (Signed)
  Problem: Education: Goal: Knowledge of risk factors and measures for prevention of condition will improve Outcome: Progressing   Problem: Coping: Goal: Psychosocial and spiritual needs will be supported Outcome: Progressing   Problem: Respiratory: Goal: Will maintain a patent airway Outcome: Progressing   Problem: Education: Goal: Ability to describe self-care measures that may prevent or decrease complications (Diabetes Survival Skills Education) will improve Outcome: Progressing   Problem: Coping: Goal: Ability to adjust to condition or change in health will improve Outcome: Progressing   Problem: Fluid Volume: Goal: Ability to maintain a balanced intake and output will improve Outcome: Progressing   Problem: Health Behavior/Discharge Planning: Goal: Ability to identify and utilize available resources and services will improve Outcome: Progressing   Problem: Metabolic: Goal: Ability to maintain appropriate glucose levels will improve Outcome: Progressing   Problem: Nutritional: Goal: Maintenance of adequate nutrition will improve Outcome: Progressing   Problem: Skin Integrity: Goal: Risk for impaired skin integrity will decrease Outcome: Progressing   Problem: Tissue Perfusion: Goal: Adequacy of tissue perfusion will improve Outcome: Progressing   Problem: Education: Goal: Knowledge of General Education information will improve Description: Including pain rating scale, medication(s)/side effects and non-pharmacologic comfort measures Outcome: Progressing   Problem: Activity: Goal: Risk for activity intolerance will decrease Outcome: Progressing   Problem: Nutrition: Goal: Adequate nutrition will be maintained Outcome: Progressing   Problem: Coping: Goal: Level of anxiety will decrease Outcome: Progressing   Problem: Elimination: Goal: Will not experience complications related to bowel motility Outcome: Progressing Goal: Will not experience  complications related to urinary retention Outcome: Progressing   Problem: Pain Managment: Goal: General experience of comfort will improve and/or be controlled Outcome: Progressing   Problem: Safety: Goal: Ability to remain free from injury will improve Outcome: Progressing   Problem: Skin Integrity: Goal: Risk for impaired skin integrity will decrease Outcome: Progressing

## 2023-12-04 NOTE — ED Notes (Addendum)
 Digoxin given pt awake and alert at this time will continue to monitor. Hr 105, BP 85/61, O2 93. No complaints at this time.

## 2023-12-04 NOTE — H&P (Addendum)
 History and Physical    Oscar [name] GNF:[id number] DOB: 1976-08-04 DOA: 12/04/2023  PCP: Preston Fleeting, MD (Confirm with patient/family/NH records and if not entered, this has to be entered at [facility] point of entry) Patient coming from: Home  I have personally briefly reviewed patient's old medical records in [facility]  Chief Complaint: Cough, SOB  HPI: Oscar [name] is a 48 y.o. male with medical history significant of chronic HFrEF with LVEF less than 20%, nonischemic dilated cardiomyopathy, IDDM, CKD stage IIIa, chronic hypoxic respiratory failure on 3 L oxygen continuously, PAF not on anticoagulation, OSA on CPAP, hypertension, HLD, COPD, gout, morbid obesity, presented with worsening of shortness of breath.  Patient reported that he was recently hospitalized for increasing shortness of breath.  It was found was seen CHF decompensation, and patient was treated with diuresis and discharged home yesterday afternoon.  However patient started develop worsening of shortness of breath and cough last night and came back to ED.  No fever or chills.  Small amount of light-colored phlegm.  Denies any peripheral edema.  No fever or chills.  COVID vaccination x1 and booster x 1 and no COVID infection in the past.  ED Course: Afebrile, no tachycardia normal tachypneic O2 saturation 94% on 4 L.  Check surgical repair showing mild pulmonary congestion.  COVID test came back positive.  Blood work showed WBC 8.9, hemoglobin 13.5, BUN 53, creatinine 1.8 compared to baseline 1.4-1.8, K3.9 bicarb 26.  He was given breathing treatment x 2 in the ED.  Review of Systems: As per HPI otherwise 14 point review of systems negative.    Past Medical History:  Diagnosis Date   AICD (automatic cardioverter/defibrillator) present    Aortic atherosclerosis (HCC)    Asthma    Chronic respiratory failure (HCC)    CKD (chronic kidney disease), stage III (HCC)    COPD (chronic obstructive pulmonary  disease) (HCC)    Deafness in right ear    Diabetes mellitus without complication (HCC)    Dyspnea    GERD (gastroesophageal reflux disease)    Gout    HFrEF (heart failure with reduced ejection fraction) (HCC)    a.) TTE 12/10/14: EF 25%; diff inf HK; sev LV and mod LA dil; LVH. b.) TTE 06/23/18: EF 20-25%; LVH; mild LV dil, mild BAE. c.) TTE 03/01/19: EF 15%, LVH, BAE; triv PR/TR. d.) TTE 02/27/20: EF < 20%; sev LV dil; mild MR. e.) TTE 11/23/20: EF < 20%; glob HK; sev LV dil; LVH; mild-mod BAE; mod-sev TR, triv AR; G1DD. f.) TTE 05/14/21: EF <15%; LVH; sev LA and mild RV enlar; triv AR/PR, mild TR, mod MR.   Hiatal hernia    History of cardiac catheterization    a.) R/LHC 12/14/2009: normal coronaries. b.) R/LHC 12/11/2014: normal coronaries.   Hyperlipidemia    Hypertension    Hypoxemia    LBBB (left bundle branch block)    NICM (nonischemic cardiomyopathy; dilated cardiomyopathy) (HCC)    a.) R/LHC 12/14/2009: normal cors; LVEDP 18 mmHg, mean PA 29 mmHg, mean PCWP 31 mmHg; CO 6 L/min; CI 2.54 L/min/m. b.) TTE 12/10/2014: EF 25%. c.) R/LHC 12/11/2014: mean RA 9 mmHg, mean PA 22 mmHg, mean PCWP 22 mmHg. d.) TTE 06/23/2018: EF 20-25%. e.) TTE 03/01/2019: EF 15%. f.) TTE 02/27/2020: < 20%. g.) TTE 11/23/2020: EF <20%. h.) TTE 05/14/2021: < 15%.   NSTEMI (non-ST elevated myocardial infarction) (HCC)    a.) x 3 per patient report ---> 2012, 2014, 2016  Obesity    On supplemental oxygen by nasal cannula    a.) 2-3 L/Datil PRN   OSA on CPAP    PAF (paroxysmal atrial fibrillation) (HCC)    a.) CHA2DS2-VASc = 4 (HFrEF, HTN, prior MI, T2DM). b.) rate/rhythm maintained without pharmacologial intervention; no current anticoagulation.   Pancreatitis    PSVT (paroxysmal supraventricular tachycardia) [facility])     Past Surgical History:  Procedure Laterality Date   BIOPSY  11/25/2021   Procedure: BIOPSY;  Surgeon: Meridee Score Netty Starring., MD;  Location: [facility];  Service: Gastroenterology;;    CARDIAC CATHETERIZATION  12/11/2014   Procedure: RIGHT/LEFT HEART CATH AND CORONARY ANGIOGRAPHY;  Surgeon: Runell Gess, MD;  Location: [facility] CATH LAB;  Service: Cardiovascular;;   COLONOSCOPY WITH PROPOFOL N/A 11/13/2021   Procedure: COLONOSCOPY WITH PROPOFOL;  Surgeon: Toney Reil, MD;  Location: [facility] ENDOSCOPY;  Service: Gastroenterology;  Laterality: N/A;   ESOPHAGOGASTRODUODENOSCOPY (EGD) WITH PROPOFOL N/A 11/25/2021   Procedure: ESOPHAGOGASTRODUODENOSCOPY (EGD) WITH PROPOFOL;  Surgeon: Meridee Score Netty Starring., MD;  Location: [facility] ENDOSCOPY;  Service: Gastroenterology;  Laterality: N/A;   ICD LEAD REMOVAL N/A 03/30/2015   Procedure: ICD LEAD REMOVAL;  Surgeon: Sharion Settler, MD;  Location: [facility] ORS;  Service: Cardiovascular;  Laterality: N/A;   IMPLANTABLE CARDIOVERTER DEFIBRILLATOR IMPLANT     INCISION AND DRAINAGE ABSCESS N/A 12/12/2020   Procedure: INCISION AND DRAINAGE ABSCESS;  Surgeon: Leafy Ro, MD;  Location: [facility] ORS;  Service: General;  Laterality: N/A;   INSERT / REPLACE / REMOVE PACEMAKER     LEFT HEART CATHETERIZATION WITH CORONARY ANGIOGRAM N/A 12/09/2014   Procedure: LEFT HEART CATHETERIZATION WITH CORONARY ANGIOGRAM;  Surgeon: Kathleene Hazel, MD;  Location: [facility] CATH LAB;  Service: Cardiovascular;  Laterality: N/A;   RIGHT/LEFT HEART CATH AND CORONARY ANGIOGRAPHY Bilateral 12/14/2009   Procedure: RIGHT/LEFT HEART CATH AND CORONARY ANGIOGRAPHY; Location: [facility]; Surgeon: Rudean Hitt, MD   TONSILLECTOMY     as a child , adnoids removed   UPPER ESOPHAGEAL ENDOSCOPIC ULTRASOUND (EUS) N/A 11/25/2021   Procedure: UPPER ESOPHAGEAL ENDOSCOPIC ULTRASOUND (EUS);  Surgeon: Lemar Lofty., MD;  Location: [facility];  Service: Gastroenterology;  Laterality: N/A;     reports that he quit smoking about 7 months ago. His smoking use included cigarettes. He has a 51 pack-year smoking history. He has been exposed to tobacco smoke. He has never used smokeless  tobacco. He reports that he does not drink alcohol and does not use drugs.  Allergies  Allergen Reactions   Ciprofloxacin Swelling and Other (See Comments)    Migraine Headache    Iodinated Contrast Media     Other reaction(s): Vomiting Projectile vomiting   Isosorb Dinitrate-Hydralazine Other (See Comments)    Migraine Headache    Family History  Problem Relation Age of Onset   Hypertension Mother    Congestive Heart Failure Mother    Hypertension Sister    Diabetes Sister    Pancreatitis Sister    COPD Sister    Emphysema Sister        smoked   Pancreatitis Brother    Anemia Neg Hx    Arrhythmia Neg Hx    Asthma Neg Hx    Clotting disorder Neg Hx    Fainting Neg Hx    Heart attack Neg Hx    Heart disease Neg Hx    Heart failure Neg Hx    Hyperlipidemia Neg Hx      Prior to Admission medications   Medication Sig Start Date End  Date Taking? Authorizing Provider  allopurinol (ZYLOPRIM) 100 MG tablet Take 100 mg by mouth daily.   Yes [provider]  atorvastatin (LIPITOR) 40 MG tablet Take 40 mg by mouth daily.   Yes [provider]  BIMZELX 160 MG/ML pen Inject 2 mLs into the skin as directed. Every 2 weeks 11/13/23  Yes [provider]  dapagliflozin propanediol (FARXIGA) 10 MG TABS tablet Take 1 tablet (10 mg total) by mouth daily. 12/04/23  Yes Esaw Grandchild A, DO  ENTRESTO 24-26 MG Take 0.5 tablets by mouth 2 (two) times daily. 09/16/22  Yes [provider]  Fluticasone-Umeclidin-Vilant (TRELEGY ELLIPTA) 100-62.5-25 MCG/ACT AEPB Inhale 1 puff into the lungs daily. 11/16/23  Yes Salena Saner, MD  insulin glargine (LANTUS) 100 UNIT/ML Solostar Pen Inject 15 Units into the skin at bedtime for 7 days. 12/03/23 12/10/23 Yes Esaw Grandchild A, DO  metoprolol succinate (TOPROL-XL) 25 MG 24 hr tablet Take 0.5 tablets (12.5 mg total) by mouth daily. 12/04/23  Yes Esaw Grandchild A, DO  NON FORMULARY Pt uses a cpap nightly   Yes [provider]  pantoprazole (PROTONIX) 40 MG tablet Take 1 tablet (40 mg total) by mouth daily. 12/03/23  Yes Esaw Grandchild A, DO  predniSONE (DELTASONE) 20 MG tablet Take 2 tablets (40 mg total) by mouth daily with breakfast for 1 day, THEN 1.5 tablets (30 mg total) daily with breakfast for 2 days, THEN 1 tablet (20 mg total) daily with breakfast for 2 days, THEN 0.5 tablets (10 mg total) daily with breakfast for 2 days. 12/04/23 12/11/23 Yes Esaw Grandchild A, DO  torsemide (DEMADEX) 20 MG tablet Take 40 mg by mouth daily.   Yes [provider]  apixaban (ELIQUIS) 5 MG TABS tablet Take 1 tablet (5 mg total) by mouth 2 (two) times daily. 12/03/23   Pennie Banter, DO    Physical Exam: Vitals:   12/04/23 0859 12/04/23 0906 12/04/23 0907  BP:  102/68   Pulse:  98   Resp:  (!) 21   Temp:  (!) 97.2 F (36.2 C)   TempSrc:  Oral   SpO2: 94% 98%   Weight:   (!) 150 kg  Height:   5\' 5"  (1.651 m)    Constitutional: NAD, calm, comfortable Vitals:   12/04/23 0859 12/04/23 0906 12/04/23 0907  BP:  102/68   Pulse:  98   Resp:  (!) 21   Temp:  (!) 97.2 F (36.2 C)   TempSrc:  Oral   SpO2: 94% 98%   Weight:   (!) 150 kg  Height:   5\' 5"  (1.651 m)   Eyes: PERRL, lids and conjunctivae normal ENMT: Mucous membranes are moist. Posterior pharynx clear of any exudate or lesions.Normal dentition.  Neck: normal, supple, no masses, no thyromegaly Respiratory: clear to auscultation bilaterally, no wheezing, fine crackles on bilateral lower fields, increasing respiratory effort. No accessory muscle use.  Cardiovascular: Regular rate and rhythm, no murmurs / rubs / gallops. 1+ extremity edema. 2+ pedal pulses. No carotid bruits.  Abdomen: no tenderness, no masses palpated. No hepatosplenomegaly. Bowel sounds positive.  Musculoskeletal: no clubbing / cyanosis. No joint deformity upper and lower extremities. Good ROM, no contractures. Normal muscle tone.  Skin: no rashes, lesions, ulcers. No  induration Neurologic: CN 2-12 grossly intact. Sensation intact, DTR normal. Strength 5/5 in all 4.  Psychiatric: Normal judgment and insight. Alert and oriented x 3. Normal mood.     Labs on Admission: I have personally  reviewed following labs and imaging studies  CBC: Recent Labs  Lab 12/01/23 0831 12/02/23 0638 12/03/23 2143 12/04/23 0928  WBC 5.0 6.2 10.0 8.9  HGB 13.2 12.8* 13.4 13.5  HCT 37.6* 36.1* 38.0* 39.1  MCV 92.8 90.7 94.3 92.9  PLT 144* 141* 161 161   Basic Metabolic Panel: Recent Labs  Lab 12/01/23 0831 12/02/23 0638 12/03/23 0432 12/03/23 2143 12/04/23 0928  NA 134* 132* 135 135 137  K 4.0 4.1 3.9 4.6 3.9  CL 101 99 100 100 101  CO2 22 22 23  21* 26  GLUCOSE 170* 374* 246* 325* 215*  BUN 51* 57* 50* 50* 53*  CREATININE 1.61* 1.87* 1.47* 1.77* 1.86*  CALCIUM 8.9 8.9 9.0 8.9 8.9   GFR: Estimated Creatinine Clearance: 66.6 mL/min (A) (by C-G formula based on SCr of 1.86 mg/dL (H)). Liver Function Tests: Recent Labs  Lab 12/02/23 1610 12/04/23 0928  AST 33 48*  ALT 34 50*  ALKPHOS 176* 150*  BILITOT 0.6 0.6  PROT 8.3* 8.1  ALBUMIN 3.3* 3.6   No results for input(s): "LIPASE", "AMYLASE" in the last 168 hours. No results for input(s): "AMMONIA" in the last 168 hours. Coagulation Profile: No results for input(s): "INR", "PROTIME" in the last 168 hours. Cardiac Enzymes: No results for input(s): "CKTOTAL", "CKMB", "CKMBINDEX", "TROPONINI" in the last 168 hours. BNP (last 3 results) No results for input(s): "PROBNP" in the last 8760 hours. HbA1C: Recent Labs    12/03/23 0432  HGBA1C 8.5*   CBG: Recent Labs  Lab 12/02/23 1129 12/02/23 1607 12/02/23 2036 12/02/23 2347 12/03/23 0850  GLUCAP 311* 309* 313* 294* 351*   Lipid Profile: No results for input(s): "CHOL", "HDL", "LDLCALC", "TRIG", "CHOLHDL", "LDLDIRECT" in the last 72 hours. Thyroid Function Tests: No results for input(s): "TSH", "T4TOTAL", "FREET4", "T3FREE", "THYROIDAB" in  the last 72 hours. Anemia Panel: No results for input(s): "VITAMINB12", "FOLATE", "FERRITIN", "TIBC", "IRON", "RETICCTPCT" in the last 72 hours. Urine analysis:    Component Value Date/Time   COLORURINE YELLOW (A) 08/03/2023 1126   APPEARANCEUR CLEAR (A) 08/03/2023 1126   APPEARANCEUR Hazy 08/21/2014 0800   LABSPEC 1.014 08/03/2023 1126   LABSPEC 1.015 08/21/2014 0800   PHURINE 5.0 08/03/2023 1126   GLUCOSEU NEGATIVE 08/03/2023 1126   GLUCOSEU Negative 08/21/2014 0800   HGBUR MODERATE (A) 08/03/2023 1126   BILIRUBINUR NEGATIVE 08/03/2023 1126   BILIRUBINUR Negative 08/21/2014 0800   KETONESUR NEGATIVE 08/03/2023 1126   PROTEINUR 30 (A) 08/03/2023 1126   NITRITE NEGATIVE 08/03/2023 1126   LEUKOCYTESUR NEGATIVE 08/03/2023 1126   LEUKOCYTESUR 1+ 08/21/2014 0800    Radiological Exams on Admission: DG Chest 2 View Result Date: 12/03/2023 CLINICAL DATA:  Chest pain, dyspnea EXAM: CHEST - 2 VIEW COMPARISON:  None Available. FINDINGS: Lungs are well expanded, symmetric, and clear. No pneumothorax or pleural effusion. Stable cardiomegaly. Left subclavian dual lead pacemaker defibrillator is unchanged. Pulmonary vascularity is normal. Osseous structures are age-appropriate. No acute bone abnormality. IMPRESSION: 1. No active cardiopulmonary disease.  Stable cardiomegaly. Electronically Signed   By: Helyn Numbers M.D.   On: 12/03/2023 21:59    EKG: Independently reviewed.  Sinus, LBBB chronic  Assessment/Plan Principal Problem:   COVID Active Problems:   COVID-19 virus infection  (please populate well all problems here in Problem List. (For example, if patient is on BP meds at home and you resume or decide to hold them, it is a problem that needs to be her. Same for CAD, COPD, HLD and so on)  Acute on  chronic hypoxic respiratory failure - Probably multifactorial, main driving force appears to be COVID-19 infection.  He also has symptoms and signs of CHF decompensation.  COVID 19  infection - Given the fact that patient has a new onset symptoms and increasing oxygen requirements, risk of decompensation is high, given complicated CHF and pulmonary history including morbid obesity COPD CHF, decided to give short course of Paxlovid - Increased prednisone dosage to 40 mg daily  Acute on chronic HFrEF decompensation - Contribute Lasix to IV 40 mg twice daily at least for 2 doses and repeat chest x-ray tomorrow - Continue Entresto  Elevated troponins -No chest pains, EKG showed chronic LBBB.  ACS unlikely -Troponin elevation likely secondary to demand ischemia from acute on chronic hypoxia and CHF decompensation.  Management as above.  CKD stage IIIa - Mild volume overload - Creatinine level stable - IV Lasix and monitor kidney function  IIDM -SSI  Morbid obesity - Consider outpatient bariatric evaluation  PAF - Sinus rhythm.  Not on chronic anticoagulation as per patient's wish according to cardiology note in October 2024.  DVT prophylaxis: Lovenox Code Status: Full code Family Communication: None at present Disposition Plan: Expect less than 2 midnight hospital stay Consults called: None Admission status: Telemetry observation   Emeline General MD [facility] Pager [phone]  12/04/2023, 12:35 PM

## 2023-12-04 NOTE — ED Notes (Signed)
 Pt complaining of 3/10 new stabbing centralized chest pain and states he feels "uncomfortable". Pt calm, breathing even and unlabored at this time, no diaphoresis noted.  Dr. Chipper Herb called and messaged. Waiting for call back at this time.

## 2023-12-04 NOTE — ED Notes (Signed)
 EDP notified of abnormal labs, Pt ambulated to BR no incident. Back in bed with precautions in place.

## 2023-12-04 NOTE — ED Notes (Signed)
 Pt reporting "he feels like he can't breathe" and is not endorsing chest pain. MD zhang notfiied.

## 2023-12-04 NOTE — ED Provider Notes (Signed)
 [facility] Provider Note   Event Date/Time   First MD Initiated Contact with Patient 12/03/23 2341     (approximate) History  Chest Pain  HPI Oscar [name] is a 48 y.o. male with a stated past medical history of chronic heart failure on 3 L oxygen chronically who presents complaining of an episode of of chest pain just prior to arrival.  Patient states that he was getting in the shower when he had an episode of chest pain, lightheadedness, and shortness of breath.  Patient believes that this is because he was not wearing his oxygen.  Patient has had symptoms similar to this in the past when he was not wearing his oxygen.  However, patient was concerned that he may have increased fluid buildup and return to the emergency department.  Patient denies any continued chest pain or shortness of breath ROS: Patient currently denies any vision changes, tinnitus, difficulty speaking, facial droop, sore throat, chest pain, shortness of breath, abdominal pain, nausea/vomiting/diarrhea, dysuria, or weakness/numbness/paresthesias in any extremity   Physical Exam  Triage Vital Signs: ED Triage Vitals  Encounter Vitals Group     BP 12/03/23 2134 97/84     Systolic BP Percentile --      Diastolic BP Percentile --      Pulse Rate 12/03/23 2134 89     Resp 12/03/23 2134 (!) 22     Temp 12/03/23 2134 98.6 F (37 C)     Temp Source 12/03/23 2134 Oral     SpO2 12/03/23 2132 96 %     Weight 12/03/23 2139 (!) 305 lb 1.9 oz (138.4 kg)     Height 12/03/23 2139 5' 5.5" (1.664 m)     Head Circumference --      Peak Flow --      Pain Score 12/03/23 2137 0     Pain Loc --      Pain Education --      Exclude from Growth Chart --    Most recent vital signs: Vitals:   12/03/23 2345 12/04/23 0000  BP: 102/74 101/73  Pulse: 68 72  Resp: 14 13  Temp:    SpO2: 96% 95%   General: Awake, oriented x4. CV:  Good peripheral perfusion.  Resp:  Normal effort.  3 L nasal cannula in  place Abd:  No distention.  Other:  Middle-aged obese African-American male resting comfortably in no acute distress ED Results / Procedures / Treatments  Labs (all labs ordered are listed, but only abnormal results are displayed) Labs Reviewed  BASIC METABOLIC PANEL WITH GFR - Abnormal; Notable for the following components:      Result Value   CO2 21 (*)    Glucose, Bld 325 (*)    BUN 50 (*)    Creatinine, Ser 1.77 (*)    GFR, Estimated 47 (*)    All other components within normal limits  CBC - Abnormal; Notable for the following components:   RBC 4.03 (*)    HCT 38.0 (*)    All other components within normal limits  BRAIN NATRIURETIC PEPTIDE - Abnormal; Notable for the following components:   B Natriuretic Peptide 326.9 (*)    All other components within normal limits  TROPONIN I (HIGH SENSITIVITY) - Abnormal; Notable for the following components:   Troponin I (High Sensitivity) 82 (*)    All other components within normal limits  TROPONIN I (HIGH SENSITIVITY) - Abnormal; Notable for the following components:   Troponin  I (High Sensitivity) 130 (*)    All other components within normal limits   EKG ED ECG REPORT I, Merwyn Katos, the attending physician, personally viewed and interpreted this ECG. Date: 12/04/2023 EKG Time: 2137 Rate: 89 Rhythm: normal sinus rhythm QRS Axis: normal Intervals: Left bundle branch block ST/T Wave abnormalities: normal Narrative Interpretation: Normal sinus rhythm with LBBB.  No evidence of acute ischemia RADIOLOGY ED MD interpretation: 2 view chest x-ray interpreted by me shows no evidence of acute abnormalities including no pneumonia, pneumothorax, or widened mediastinum.  Stable cardiomegaly -Agree with radiology assessment Official radiology report(s): DG Chest 2 View Result Date: 12/03/2023 CLINICAL DATA:  Chest pain, dyspnea EXAM: CHEST - 2 VIEW COMPARISON:  None Available. FINDINGS: Lungs are well expanded, symmetric, and clear. No  pneumothorax or pleural effusion. Stable cardiomegaly. Left subclavian dual lead pacemaker defibrillator is unchanged. Pulmonary vascularity is normal. Osseous structures are age-appropriate. No acute bone abnormality. IMPRESSION: 1. No active cardiopulmonary disease.  Stable cardiomegaly. Electronically Signed   By: Helyn Numbers M.D.   On: 12/03/2023 21:59   PROCEDURES: Critical Care performed: No Procedures MEDICATIONS ORDERED IN ED: Medications - No data to display IMPRESSION / MDM / ASSESSMENT AND PLAN / ED COURSE  I reviewed the triage vital signs and the nursing notes.                             The patient is on the cardiac monitor to evaluate for evidence of arrhythmia and/or significant heart rate changes. Patient's presentation is most consistent with acute presentation with potential threat to life or bodily function. The patient is suffering from shortness of breath, but the immediate cause is not apparent.  However, this may have been due to patient showering without his oxygen on as he states he has had similar symptoms in the past.  Potential causes considered include, but are not limited to, asthma or COPD, congestive heart failure, pulmonary embolism, pneumothorax, coronary syndrome, pneumonia, and pleural effusion. Patient's initial troponin was at his baseline at 80 and's second troponin was mildly elevated to 130.  Patient denies any continued chest pain.  There is a stable EKG with no signs of acute ischemia.   Despite the evaluation including history, exam, and testing, the cause of the shortness of breath remains unclear. However, during the ED stay, patient's condition improved, and at the time of discharge the shortness of breath is resolved, they are feeling well, and want to go home.  Patient will be discharged with strict return precautions and advice to follow up with primary MD within 24 hours for further evaluation.   FINAL CLINICAL IMPRESSION(S) / ED DIAGNOSES    Final diagnoses:  Nonspecific chest pain  Shortness of breath   Rx / DC Orders   ED Discharge Orders     None      Note:  This document was prepared using Dragon voice recognition software and may include unintentional dictation errors.   Merwyn Katos, MD 12/04/23 [phone]

## 2023-12-04 NOTE — ED Notes (Signed)
 Fall precautions in place for Pt. This RN placed fall band, fall grip socks, bed alarm and fall sign.

## 2023-12-04 NOTE — Inpatient Diabetes Management (Signed)
 Inpatient Diabetes Program Recommendations  AACE/ADA: New Consensus Statement on Inpatient Glycemic Control (2015)  Target Ranges:  Prepandial:   less than 140 mg/dL      Peak postprandial:   less than 180 mg/dL (1-2 hours)      Critically ill patients:  140 - 180 mg/dL   Lab Results  Component Value Date   GLUCAP 351 (H) 12/03/2023   HGBA1C 8.5 (H) 12/03/2023    Latest Reference Range & Units 12/02/23 16:07 12/02/23 20:36 12/02/23 23:47 12/03/23 08:50  Glucose-Capillary 70 - 99 mg/dL 098 (H) 119 (H) 147 (H) 351 (H)  (H): Data is abnormally high Review of Glycemic Control  Diabetes history: type 2 Outpatient Diabetes medications: Lantus 15 units daily,(for 7 days) Farxiga 10 mg daily Current orders for Inpatient glycemic control: none  Inpatient Diabetes Program Recommendations:   Noted that patient's blood sugars have been greater than 180 mg/dl.   Recommend starting Semglee 20 units daily, Novolog 0-20 units correction scale TID, Novolog 0-5 units HS scale while in the hospital. Titrate dosages as needed.   Smith Mince RN BSN CDE Diabetes Coordinator Pager: [phone]  8am-5pm

## 2023-12-04 NOTE — ED Notes (Signed)
 Pt's Hr began to jump into 130's sustaining. EKG done and MD Zhang notified. EKG read critical result and charge notified to page MD Chipper Herb. MD responded via secure chat "pt has known LBBB". MD Zhang ordered 5mg  metropolol via IV. Pt was given 4mg  and BP went to 69/52. This Clinical research associate did not push last 1 mg. MD zhang was notified of incomplete dose. Pt is A&OX4, no complaints of chest pain, endorses SOB. Will continue to monitor.

## 2023-12-04 NOTE — ED Triage Notes (Signed)
 S: - Pt arrives via EMS from home  B: - Medical Hx: DM, CHF, COPD - Pertinent Medications: Prednisone x3weeks A: - EMS Assessment: Wheezing upper lungs, dim lower lobes, a little clearer afterx1 Tx - EMS Vitals: 94% RA, BP 120/74, HR: 88, Sugar 190 A/ox4 - EMS interventions:x1 duoneb Is Pt on O2? Is this their baseline? 3L O2 @ home,  - This RNs Assessment: Pt alert and oriented  Airway - Patent  Breathing -  3L O2 R: - What brought Pt into ER today? Pt was d/c x2 from hospital within last week, states his breathing felt off today and couldn't breathe so he called EMS. States his 3L didn't help feels like he has fluid in his lungs.

## 2023-12-04 NOTE — ED Notes (Signed)
 MD at bedside digoxin ordered.

## 2023-12-04 NOTE — ED Notes (Signed)
 Pt states he uses 6 pillows to prop himself up to breath

## 2023-12-04 NOTE — ED Notes (Addendum)
 Pt given incentive spirometer with teachback. Breathing Tx equipment sent to specials area where Pt moving.

## 2023-12-04 NOTE — Progress Notes (Signed)
       CROSS COVER NOTE  NAME: Oscar [name] MRN: [id number] DOB : March 18, 1976 ATTENDING PHYSICIAN: Emeline General, MD    Date of Service   12/04/2023   HPI/Events of Note   Received message from RN patient with OSA uses CPAP at home and requesting here  Interventions   Assessment/Plan: Review of chart patient has chronic respiratory failure with hypoxia and hypercarbia (nocturnal hypoxemia due to obesity,  severe OSA with use of trilogy, COPD, obesity hypoventilation in addition to CHF) CPAP ordered - RT to assess for pressures, may require BIPAP at HS here        Donnie Mesa NP [facility] Cross Cover 7pm-7am - check amion for availability Pager [phone]

## 2023-12-04 NOTE — ED Notes (Signed)
 Urine sent to lab with Pt label

## 2023-12-05 ENCOUNTER — Observation Stay

## 2023-12-05 DIAGNOSIS — N179 Acute kidney failure, unspecified: Secondary | ICD-10-CM | POA: Diagnosis not present

## 2023-12-05 DIAGNOSIS — R0602 Shortness of breath: Secondary | ICD-10-CM | POA: Diagnosis present

## 2023-12-05 DIAGNOSIS — I480 Paroxysmal atrial fibrillation: Secondary | ICD-10-CM | POA: Diagnosis present

## 2023-12-05 DIAGNOSIS — E66813 Obesity, class 3: Secondary | ICD-10-CM | POA: Insufficient documentation

## 2023-12-05 DIAGNOSIS — I493 Ventricular premature depolarization: Secondary | ICD-10-CM | POA: Diagnosis present

## 2023-12-05 DIAGNOSIS — M109 Gout, unspecified: Secondary | ICD-10-CM | POA: Diagnosis present

## 2023-12-05 DIAGNOSIS — E1122 Type 2 diabetes mellitus with diabetic chronic kidney disease: Secondary | ICD-10-CM | POA: Diagnosis present

## 2023-12-05 DIAGNOSIS — I447 Left bundle-branch block, unspecified: Secondary | ICD-10-CM | POA: Diagnosis present

## 2023-12-05 DIAGNOSIS — Z6841 Body Mass Index (BMI) 40.0 and over, adult: Secondary | ICD-10-CM | POA: Diagnosis not present

## 2023-12-05 DIAGNOSIS — I5A Non-ischemic myocardial injury (non-traumatic): Secondary | ICD-10-CM | POA: Diagnosis present

## 2023-12-05 DIAGNOSIS — J441 Chronic obstructive pulmonary disease with (acute) exacerbation: Secondary | ICD-10-CM | POA: Diagnosis present

## 2023-12-05 DIAGNOSIS — I420 Dilated cardiomyopathy: Secondary | ICD-10-CM | POA: Diagnosis present

## 2023-12-05 DIAGNOSIS — J9621 Acute and chronic respiratory failure with hypoxia: Secondary | ICD-10-CM | POA: Diagnosis present

## 2023-12-05 DIAGNOSIS — R000 Tachycardia, unspecified: Secondary | ICD-10-CM | POA: Diagnosis not present

## 2023-12-05 DIAGNOSIS — E871 Hypo-osmolality and hyponatremia: Secondary | ICD-10-CM | POA: Insufficient documentation

## 2023-12-05 DIAGNOSIS — Z794 Long term (current) use of insulin: Secondary | ICD-10-CM | POA: Diagnosis not present

## 2023-12-05 DIAGNOSIS — Z9981 Dependence on supplemental oxygen: Secondary | ICD-10-CM | POA: Diagnosis not present

## 2023-12-05 DIAGNOSIS — U071 COVID-19: Secondary | ICD-10-CM

## 2023-12-05 DIAGNOSIS — E1165 Type 2 diabetes mellitus with hyperglycemia: Secondary | ICD-10-CM

## 2023-12-05 DIAGNOSIS — E785 Hyperlipidemia, unspecified: Secondary | ICD-10-CM | POA: Diagnosis present

## 2023-12-05 DIAGNOSIS — E662 Morbid (severe) obesity with alveolar hypoventilation: Secondary | ICD-10-CM | POA: Diagnosis present

## 2023-12-05 DIAGNOSIS — I2489 Other forms of acute ischemic heart disease: Secondary | ICD-10-CM | POA: Diagnosis present

## 2023-12-05 DIAGNOSIS — N1831 Chronic kidney disease, stage 3a: Secondary | ICD-10-CM

## 2023-12-05 DIAGNOSIS — I4891 Unspecified atrial fibrillation: Secondary | ICD-10-CM | POA: Diagnosis not present

## 2023-12-05 DIAGNOSIS — J9622 Acute and chronic respiratory failure with hypercapnia: Secondary | ICD-10-CM | POA: Diagnosis present

## 2023-12-05 DIAGNOSIS — I5023 Acute on chronic systolic (congestive) heart failure: Secondary | ICD-10-CM | POA: Diagnosis present

## 2023-12-05 DIAGNOSIS — I130 Hypertensive heart and chronic kidney disease with heart failure and stage 1 through stage 4 chronic kidney disease, or unspecified chronic kidney disease: Secondary | ICD-10-CM | POA: Diagnosis present

## 2023-12-05 LAB — BASIC METABOLIC PANEL WITH GFR
Anion gap: 10 (ref 5–15)
BUN: 61 mg/dL — ABNORMAL HIGH (ref 6–20)
CO2: 23 mmol/L (ref 22–32)
Calcium: 8.7 mg/dL — ABNORMAL LOW (ref 8.9–10.3)
Chloride: 101 mmol/L (ref 98–111)
Creatinine, Ser: 2.05 mg/dL — ABNORMAL HIGH (ref 0.61–1.24)
GFR, Estimated: 39 mL/min — ABNORMAL LOW (ref >60)
Glucose, Bld: 257 mg/dL — ABNORMAL HIGH (ref 70–99)
Potassium: 4.5 mmol/L (ref 3.5–5.1)
Sodium: 134 mmol/L — ABNORMAL LOW (ref 135–145)

## 2023-12-05 LAB — TROPONIN I (HIGH SENSITIVITY)
Troponin I (High Sensitivity): 100 ng/L (ref <18)
Troponin I (High Sensitivity): 92 ng/L — ABNORMAL HIGH (ref <18)

## 2023-12-05 LAB — GLUCOSE, CAPILLARY
Glucose-Capillary: 231 mg/dL — ABNORMAL HIGH (ref 70–99)
Glucose-Capillary: 269 mg/dL — ABNORMAL HIGH (ref 70–99)
Glucose-Capillary: 310 mg/dL — ABNORMAL HIGH (ref 70–99)
Glucose-Capillary: 245 mg/dL — ABNORMAL HIGH (ref 70–99)

## 2023-12-05 MED ORDER — IPRATROPIUM-ALBUTEROL 0.5-2.5 (3) MG/3ML IN SOLN: 3.0000 mL | Freq: Four times a day (QID) | RESPIRATORY_TRACT | Status: DC

## 2023-12-05 MED ORDER — INSULIN ASPART 100 UNIT/ML IJ SOLN
0.0000 [IU] | Freq: Three times a day (TID) | INTRAMUSCULAR | Status: DC
  Administered 2023-12-05: 11 [IU] via SUBCUTANEOUS
  Administered 2023-12-05: 8 [IU] via SUBCUTANEOUS
  Administered 2023-12-06: 3 [IU] via SUBCUTANEOUS
  Administered 2023-12-06: 5 [IU] via SUBCUTANEOUS
  Administered 2023-12-06: 3 [IU] via SUBCUTANEOUS
  Administered 2023-12-07: 5 [IU] via SUBCUTANEOUS
  Administered 2023-12-07 – 2023-12-08 (×4): 3 [IU] via SUBCUTANEOUS
  Filled 2023-12-05 (×10): qty 1

## 2023-12-05 MED ORDER — IPRATROPIUM-ALBUTEROL 20-100 MCG/ACT IN AERS
2.0000 | INHALATION_SPRAY | Freq: Four times a day (QID) | RESPIRATORY_TRACT | Status: DC
  Administered 2023-12-05 – 2023-12-08 (×12): 2 via RESPIRATORY_TRACT
  Filled 2023-12-05: qty 4

## 2023-12-05 MED ORDER — IPRATROPIUM-ALBUTEROL 20-100 MCG/ACT IN AERS: 2.0000 | INHALATION_SPRAY | Freq: Four times a day (QID) | RESPIRATORY_TRACT | Status: DC

## 2023-12-05 MED ORDER — PREDNISONE 20 MG PO TABS: 20.0000 mg | ORAL_TABLET | Freq: Every day | ORAL | Status: DC

## 2023-12-05 MED ORDER — INSULIN ASPART 100 UNIT/ML IJ SOLN
4.0000 [IU] | Freq: Three times a day (TID) | INTRAMUSCULAR | Status: DC
  Administered 2023-12-05 – 2023-12-08 (×10): 4 [IU] via SUBCUTANEOUS
  Filled 2023-12-05 (×10): qty 1

## 2023-12-05 MED ORDER — IPRATROPIUM-ALBUTEROL 20-100 MCG/ACT IN AERS: 1.0000 | INHALATION_SPRAY | Freq: Four times a day (QID) | RESPIRATORY_TRACT | Status: DC

## 2023-12-05 MED ORDER — ALBUMIN HUMAN 25 % IV SOLN
25.0000 g | Freq: Once | INTRAVENOUS | Status: AC
  Administered 2023-12-05: 25 g via INTRAVENOUS
  Filled 2023-12-05: qty 100

## 2023-12-05 MED ORDER — IPRATROPIUM-ALBUTEROL 0.5-2.5 (3) MG/3ML IN SOLN
3.0000 mL | Freq: Four times a day (QID) | RESPIRATORY_TRACT | Status: DC
  Filled 2023-12-05: qty 3

## 2023-12-05 MED ORDER — METOPROLOL TARTRATE 5 MG/5ML IV SOLN: 5.0000 mg | Freq: Four times a day (QID) | INTRAVENOUS | Status: DC | PRN

## 2023-12-05 MED ORDER — INSULIN ASPART 100 UNIT/ML IJ SOLN
0.0000 [IU] | Freq: Every day | INTRAMUSCULAR | Status: DC
  Administered 2023-12-05: 2 [IU] via SUBCUTANEOUS
  Administered 2023-12-06: 4 [IU] via SUBCUTANEOUS
  Administered 2023-12-07: 3 [IU] via SUBCUTANEOUS
  Filled 2023-12-05 (×3): qty 1

## 2023-12-05 NOTE — Plan of Care (Signed)
  Problem: Education: Goal: Knowledge of risk factors and measures for prevention of condition will improve Outcome: Progressing   Problem: Coping: Goal: Psychosocial and spiritual needs will be supported Outcome: Progressing   Problem: Respiratory: Goal: Will maintain a patent airway Outcome: Progressing   Problem: Education: Goal: Ability to describe self-care measures that may prevent or decrease complications (Diabetes Survival Skills Education) will improve Outcome: Progressing   Problem: Health Behavior/Discharge Planning: Goal: Ability to identify and utilize available resources and services will improve Outcome: Progressing   Problem: Metabolic: Goal: Ability to maintain appropriate glucose levels will improve Outcome: Progressing   Problem: Nutritional: Goal: Maintenance of adequate nutrition will improve Outcome: Progressing   Problem: Activity: Goal: Risk for activity intolerance will decrease Outcome: Progressing   Problem: Nutrition: Goal: Adequate nutrition will be maintained Outcome: Progressing   Problem: Coping: Goal: Level of anxiety will decrease Outcome: Progressing   Problem: Elimination: Goal: Will not experience complications related to bowel motility Outcome: Progressing

## 2023-12-05 NOTE — Hospital Course (Signed)
 Oscar [name] is a 48 y.o. male with medical history significant of chronic HFrEF with LVEF less than 20%, nonischemic dilated cardiomyopathy, IDDM, CKD stage IIIa, chronic hypoxic respiratory failure on 3 L oxygen continuously, PAF not on anticoagulation, OSA on CPAP, hypertension, HLD, COPD, gout, morbid obesity, presented with worsening of shortness of breath.  Was found to have positive COVID. She initially received IV Lasix, started volume status better afterwards.  Patient had a COPD exacerbation from COVID, condition has improved.  Medically stable for discharge.

## 2023-12-05 NOTE — Progress Notes (Signed)
 Patient brought to the unit via wheel chair by transport personnel. No obvious distress noted. Orientated to unit and call bell. Made comfortable in bed.

## 2023-12-05 NOTE — Progress Notes (Addendum)
 Progress Note   Patient: Oscar [name] ZOX:[id number] DOB: 19-Nov-1975 DOA: 12/04/2023     0 DOS: the patient was seen and examined on 12/05/2023   Brief hospital course: CALAB SACHSE is a 48 y.o. male with medical history significant of chronic HFrEF with LVEF less than 20%, nonischemic dilated cardiomyopathy, IDDM, CKD stage IIIa, chronic hypoxic respiratory failure on 3 L oxygen continuously, PAF not on anticoagulation, OSA on CPAP, hypertension, HLD, COPD, gout, morbid obesity, presented with worsening of shortness of breath.  Was found to have positive COVID.   Principal Problem:   COVID Active Problems:   COPD with acute exacerbation (HCC)   Acute renal failure superimposed on stage 3a chronic kidney disease (HCC)   COVID-19 virus infection   Class 3 obesity   Hyponatremia   Uncontrolled type 2 diabetes mellitus with hyperglycemia, with long-term current use of insulin (HCC)   Assessment and Plan: Acute on chronic respiratory failure with hypoxemia COPD exacerbation secondary to COVID infection. COVID infection. Patient had a worsening short of breath and came back the same day after discharge. Found to have a COVID-positive.  Volume status have improved.  Worsening condition mainly from COPD exacerbation from COVID.  He has received IV steroids, on oral prednisone, I will decrease dose to 20 mg daily.  Scheduled bronchodilator. Antiviral agent not started due to interaction with heart failure treatment.  Acute on chronic systolic congestive heart failure. Myocardial injury. Patient received iv Lasix, currently volume status is better.  Renal function is worse after Lasix, will hold off additional diuretics.  Will assess daily for need of diuretics. Patient had elevated troponin in the 121, this is secondary to demand ischemia.  Patient complaining of more chest discomfort, this could be secondary to COPD exacerbation, we will obtain another EKG and a troponin to rule out  acute coronary syndrome.  Acute renal failure superimposed on stage IIIa chronic kidney disease. Hyponatremia. Renal function is worse today, with dose of albumin.  Hold off diuretics.  Uncontrolled type 2 diabetes with hyperglycemia. Resume home dose insulin, adjust the dose of NovoLog to keep scheduled and sliding scale insulin.  May need additional adjustment with the weaning of steroids.  Class III morbid obesity with BMI 55. Obstructive sleep apnea.  On CPAP. Complicated prognosis and treatment.  Diet exercise advised.  Paroxysmal atrial fibrillation. Currently on Eliquis.   Addendum: 1451. Patient developed wide complex tachycardia. I reviewed EKG and all tele strips over the last 3 days. Looks like atrial fib with RVR with bundle branch block. Trop has come down. Will re-consult cardiology (notified Dr. Juliann Pares). Give metoprolol.   Subjective:  Patient still has significant chest discomfort, short of breath.  Wheezing better.   Physical Exam: Vitals:   12/04/23 2031 12/04/23 2231 12/05/23 0412 12/05/23 0945  BP: (!) 149/86 96/66 (!) 103/41 91/76  Pulse:  76 71 (!) 56  Resp: 20 20 16 18   Temp: 98 F (36.7 C) 97.9 F (36.6 C) (!) 97.5 F (36.4 C) 97.7 F (36.5 C)  TempSrc:   Oral Oral  SpO2: 98% 98% 96% 93%  Weight:      Height:       General exam: Appears calm and comfortable, morbid obese. Respiratory system: Decreased breath sounds. Respiratory effort normal. Cardiovascular system: S1 & S2 heard, RRR. No JVD, murmurs, rubs, gallops or clicks. No pedal edema. Gastrointestinal system: Abdomen is nondistended, soft and nontender. No organomegaly or masses felt. Normal bowel sounds heard. Central nervous system: Alert  and oriented. No focal neurological deficits. Extremities: Symmetric 5 x 5 power. Skin: No rashes, lesions or ulcers Psychiatry: Judgement and insight appear normal. Mood & affect appropriate.    Data Reviewed:  Chest x-ray results, lab  results.  Family Communication: None  Disposition: Status is: Observation The patient will require care spanning > 2 midnights and should be moved to inpatient because: Severity of disease.     Time spent: 55 minutes  Author: Marrion Coy, MD 12/05/2023 10:47 AM  For on call review [email or web address].

## 2023-12-05 NOTE — Progress Notes (Signed)
 Spoke to provider about heart rate per Tele concerns with Wide QRS and QTC ranges. Dr. Chipper Herb informed me that pt is in Afib with BBB.

## 2023-12-05 NOTE — Plan of Care (Signed)
 Pt is alert and oriented, denies ay c/o pain. Pt positive for COVID on isolation. Pt has hx HF with EF <20% with chronic oxygen 3L/[place] and Afib with BBB. Pt on Tele, heart rate elevates with any exertion.  Problem: Education: Goal: Knowledge of risk factors and measures for prevention of condition will improve Outcome: Not Progressing   Problem: Coping: Goal: Psychosocial and spiritual needs will be supported Outcome: Not Progressing   Problem: Respiratory: Goal: Will maintain a patent airway Outcome: Not Progressing Goal: Complications related to the disease process, condition or treatment will be avoided or minimized Outcome: Not Progressing   Problem: Education: Goal: Ability to describe self-care measures that may prevent or decrease complications (Diabetes Survival Skills Education) will improve Outcome: Not Progressing Goal: Individualized Educational Video(s) Outcome: Not Progressing   Problem: Coping: Goal: Ability to adjust to condition or change in health will improve Outcome: Not Progressing   Problem: Fluid Volume: Goal: Ability to maintain a balanced intake and output will improve Outcome: Not Progressing   Problem: Health Behavior/Discharge Planning: Goal: Ability to identify and utilize available resources and services will improve Outcome: Not Progressing Goal: Ability to manage health-related needs will improve Outcome: Not Progressing   Problem: Metabolic: Goal: Ability to maintain appropriate glucose levels will improve Outcome: Not Progressing   Problem: Nutritional: Goal: Maintenance of adequate nutrition will improve Outcome: Not Progressing Goal: Progress toward achieving an optimal weight will improve Outcome: Not Progressing   Problem: Skin Integrity: Goal: Risk for impaired skin integrity will decrease Outcome: Not Progressing   Problem: Tissue Perfusion: Goal: Adequacy of tissue perfusion will improve Outcome: Not Progressing   Problem:  Education: Goal: Knowledge of General Education information will improve Description: Including pain rating scale, medication(s)/side effects and non-pharmacologic comfort measures Outcome: Not Progressing   Problem: Health Behavior/Discharge Planning: Goal: Ability to manage health-related needs will improve Outcome: Not Progressing   Problem: Clinical Measurements: Goal: Ability to maintain clinical measurements within normal limits will improve Outcome: Not Progressing Goal: Will remain free from infection Outcome: Not Progressing Goal: Diagnostic test results will improve Outcome: Not Progressing Goal: Respiratory complications will improve Outcome: Not Progressing Goal: Cardiovascular complication will be avoided Outcome: Not Progressing   Problem: Activity: Goal: Risk for activity intolerance will decrease Outcome: Not Progressing   Problem: Nutrition: Goal: Adequate nutrition will be maintained Outcome: Not Progressing   Problem: Coping: Goal: Level of anxiety will decrease Outcome: Not Progressing   Problem: Elimination: Goal: Will not experience complications related to bowel motility Outcome: Not Progressing Goal: Will not experience complications related to urinary retention Outcome: Not Progressing   Problem: Pain Managment: Goal: General experience of comfort will improve and/or be controlled Outcome: Not Progressing   Problem: Safety: Goal: Ability to remain free from injury will improve Outcome: Not Progressing   Problem: Skin Integrity: Goal: Risk for impaired skin integrity will decrease Outcome: Not Progressing

## 2023-12-06 DIAGNOSIS — U071 COVID-19: Secondary | ICD-10-CM | POA: Diagnosis not present

## 2023-12-06 DIAGNOSIS — J441 Chronic obstructive pulmonary disease with (acute) exacerbation: Secondary | ICD-10-CM

## 2023-12-06 DIAGNOSIS — N179 Acute kidney failure, unspecified: Secondary | ICD-10-CM | POA: Diagnosis not present

## 2023-12-06 DIAGNOSIS — J9621 Acute and chronic respiratory failure with hypoxia: Secondary | ICD-10-CM | POA: Diagnosis not present

## 2023-12-06 LAB — BASIC METABOLIC PANEL WITH GFR
Anion gap: 8 (ref 5–15)
BUN: 61 mg/dL — ABNORMAL HIGH (ref 6–20)
CO2: 25 mmol/L (ref 22–32)
Calcium: 8.9 mg/dL (ref 8.9–10.3)
Chloride: 100 mmol/L (ref 98–111)
Creatinine, Ser: 1.6 mg/dL — ABNORMAL HIGH (ref 0.61–1.24)
GFR, Estimated: 53 mL/min — ABNORMAL LOW (ref >60)
Glucose, Bld: 235 mg/dL — ABNORMAL HIGH (ref 70–99)
Potassium: 4.2 mmol/L (ref 3.5–5.1)
Sodium: 133 mmol/L — ABNORMAL LOW (ref 135–145)

## 2023-12-06 LAB — GLUCOSE, CAPILLARY
Glucose-Capillary: 185 mg/dL — ABNORMAL HIGH (ref 70–99)
Glucose-Capillary: 225 mg/dL — ABNORMAL HIGH (ref 70–99)
Glucose-Capillary: 179 mg/dL — ABNORMAL HIGH (ref 70–99)
Glucose-Capillary: 319 mg/dL — ABNORMAL HIGH (ref 70–99)

## 2023-12-06 LAB — MAGNESIUM: Magnesium: 2.3 mg/dL (ref 1.7–2.4)

## 2023-12-06 MED ORDER — AMIODARONE HCL 200 MG PO TABS
200.0000 mg | ORAL_TABLET | Freq: Two times a day (BID) | ORAL | Status: DC
  Administered 2023-12-06 – 2023-12-08 (×5): 200 mg via ORAL
  Filled 2023-12-06 (×5): qty 1

## 2023-12-06 MED ORDER — METOPROLOL SUCCINATE ER 25 MG PO TB24: 25.0000 mg | ORAL_TABLET | Freq: Every day | ORAL | Status: DC

## 2023-12-06 MED ORDER — INSULIN GLARGINE-YFGN 100 UNIT/ML ~~LOC~~ SOLN
16.0000 [IU] | Freq: Every day | SUBCUTANEOUS | Status: DC
  Administered 2023-12-06 – 2023-12-08 (×3): 16 [IU] via SUBCUTANEOUS
  Filled 2023-12-06 (×3): qty 0.16

## 2023-12-06 MED ORDER — FUROSEMIDE 40 MG PO TABS
40.0000 mg | ORAL_TABLET | Freq: Two times a day (BID) | ORAL | Status: DC
  Administered 2023-12-06 (×2): 40 mg via ORAL
  Filled 2023-12-06 (×2): qty 1

## 2023-12-06 MED ORDER — PREDNISONE 10 MG PO TABS
10.0000 mg | ORAL_TABLET | Freq: Every day | ORAL | Status: DC
  Filled 2023-12-06: qty 1

## 2023-12-06 NOTE — Progress Notes (Signed)
 Progress Note   Patient: Oscar [name] BJY:[id number] DOB: 1976-07-18 DOA: 12/04/2023     1 DOS: the patient was seen and examined on 12/06/2023   Brief hospital course: Oscar [name] is a 48 y.o. male with medical history significant of chronic HFrEF with LVEF less than 20%, nonischemic dilated cardiomyopathy, IDDM, CKD stage IIIa, chronic hypoxic respiratory failure on 3 L oxygen continuously, PAF not on anticoagulation, OSA on CPAP, hypertension, HLD, COPD, gout, morbid obesity, presented with worsening of shortness of breath.  Was found to have positive COVID.   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 COVID infection. 4/5.Patient had a worsening short of breath and came back the same day after discharge. Found to have a COVID-positive.  Volume status have improved.  Worsening condition mainly from COPD exacerbation from COVID.  He has received IV steroids, on oral prednisone, I will decrease dose to 20 mg daily.  Scheduled bronchodilator. Antiviral agent not started due to interaction with heart failure treatment. 4/6.  Short of breath has been improving, refused to take additional steroids.  Continue scheduled bronchodilator, discontinue steroids.  Patient may be able to discharge tomorrow.   Acute on chronic systolic congestive heart failure s/p AICD. Myocardial injury. Patient received iv Lasix, currently volume status is better.   Renal function is worse after Lasix, will hold off additional diuretics.  Will assess daily for need of diuretics. Patient had chest discomfort on 4/5, improved after giving bronchodilator.  Troponin trend down, not consistent with acute coronary syndrome. Renal function has been better, restart oral Lasix.   Acute renal failure superimposed on stage IIIa chronic kidney disease. Hyponatremia. Renal function much improved after giving albumin.  Restart Lasix.   Uncontrolled type 2 diabetes with hyperglycemia. Increase the dose of insulin glargine, discontinue steroids.  Continue to follow.   Class III morbid obesity with BMI 55. Obstructive sleep apnea.  On CPAP. Complicated prognosis and treatment.  Diet exercise advised.   Paroxysmal atrial fibrillation with RVR. Patient converted back to atrial fibrillation yesterday, he has some wide-complex tachycardia in the setting of atrial fibrillation.  Wide-complex was due to LBBB. He still has some tachycardia with exertion, increased dose of metoprolol 25 mg daily.      Subjective:  History has been short of breath, but generally improved since yesterday.  Chest discomfort has resolved.   Physical Exam: Vitals:   12/06/23 0050 12/06/23 0527 12/06/23 0753 12/06/23 1154  BP: 112/86 (!) 136/125 111/81 (!) 94/53  Pulse: 71 (!) 53  76  Resp: 20 20 20    Temp:   97.7 F (36.5 C)   TempSrc:      SpO2: 99% 100% 97% 100%  Weight:      Height:       General exam: Appears calm and comfortable, morbid obese. Respiratory system: Decreased breath sounds. Respiratory effort normal. Cardiovascular system: Irregularly irregular. No JVD, murmurs, rubs, gallops or clicks. No pedal edema. Gastrointestinal  system: Abdomen is nondistended, soft and nontender. No organomegaly or masses felt. Normal bowel sounds heard. Central nervous system: Alert and oriented. No focal neurological deficits. Extremities: Symmetric 5 x 5 power. Skin: No rashes,  lesions or ulcers Psychiatry: Judgement and insight appear normal. Mood & affect appropriate.    Data Reviewed:  Lab results reviewed.  Family Communication: None  Disposition: Status is: Inpatient Remains inpatient appropriate because: Severity of disease.     Time spent: 50 minutes  Author: Marrion Coy, MD 12/06/2023 12:41 PM  For on call review [email or web address].

## 2023-12-06 NOTE — Plan of Care (Signed)

## 2023-12-06 NOTE — Plan of Care (Signed)
  Problem: Education: Goal: Knowledge of risk factors and measures for prevention of condition will improve Outcome: Progressing   Problem: Coping: Goal: Psychosocial and spiritual needs will be supported Outcome: Progressing   Problem: Respiratory: Goal: Will maintain a patent airway Outcome: Progressing Goal: Complications related to the disease process, condition or treatment will be avoided or minimized Outcome: Progressing   Problem: Education: Goal: Ability to describe self-care measures that may prevent or decrease complications (Diabetes Survival Skills Education) will improve Outcome: Progressing   Problem: Coping: Goal: Ability to adjust to condition or change in health will improve Outcome: Progressing   Problem: Fluid Volume: Goal: Ability to maintain a balanced intake and output will improve Outcome: Progressing   Problem: Health Behavior/Discharge Planning: Goal: Ability to identify and utilize available resources and services will improve Outcome: Progressing Goal: Ability to manage health-related needs will improve Outcome: Progressing   Problem: Metabolic: Goal: Ability to maintain appropriate glucose levels will improve Outcome: Progressing   Problem: Nutritional: Goal: Maintenance of adequate nutrition will improve Outcome: Progressing Goal: Progress toward achieving an optimal weight will improve Outcome: Progressing   Problem: Skin Integrity: Goal: Risk for impaired skin integrity will decrease Outcome: Progressing   Problem: Tissue Perfusion: Goal: Adequacy of tissue perfusion will improve Outcome: Progressing   Problem: Education: Goal: Knowledge of General Education information will improve Description: Including pain rating scale, medication(s)/side effects and non-pharmacologic comfort measures Outcome: Progressing   Problem: Health Behavior/Discharge Planning: Goal: Ability to manage health-related needs will improve Outcome:  Progressing   Problem: Clinical Measurements: Goal: Ability to maintain clinical measurements within normal limits will improve Outcome: Progressing Goal: Will remain free from infection Outcome: Progressing Goal: Diagnostic test results will improve Outcome: Progressing Goal: Respiratory complications will improve Outcome: Progressing Goal: Cardiovascular complication will be avoided Outcome: Progressing   Problem: Activity: Goal: Risk for activity intolerance will decrease Outcome: Progressing   Problem: Nutrition: Goal: Adequate nutrition will be maintained Outcome: Progressing   Problem: Coping: Goal: Level of anxiety will decrease Outcome: Progressing   Problem: Elimination: Goal: Will not experience complications related to bowel motility Outcome: Progressing Goal: Will not experience complications related to urinary retention Outcome: Progressing   Problem: Pain Managment: Goal: General experience of comfort will improve and/or be controlled Outcome: Progressing   Problem: Safety: Goal: Ability to remain free from injury will improve Outcome: Progressing   Problem: Skin Integrity: Goal: Risk for impaired skin integrity will decrease Outcome: Progressing

## 2023-12-06 NOTE — Consult Note (Signed)
 WOC consulted for groin wound.  Secure chat to primary nurse and MD for photo to be uploaded.   Please note that the [facility] nursing team is utilizing a standardized work plan to manage patient consults. We are triaging consults and will try to see the patients within 48 hours. Wound photos in the patient's chart allow [place] to consult on the patient in the most efficient and timely manner.    Thank you,    Priscella Mann MSN, RN-BC, [employer] [phone]

## 2023-12-06 NOTE — Consult Note (Signed)
 CARDIOLOGY CONSULT NOTE               Patient ID: Oscar [name] MRN: [id number] DOB/AGE: [id number] y.o.  Admit date: 12/04/2023 Referring Physician Dr Mikey College hospitalist Primary Physician Dr. Preston [facility] health Primary Cardiologist [facility] Reason for Consultation PVCs SOB Tachycardia  HPI: Patient is a 48 year old obese black male HFrEF ejection fraction initially less than 20% had device placed AICD CR CAD with significant improvement in LV function to about 30 to 35% patient has nonischemic cardiomyopathy diabetes renal insufficiency stage IIIb chronic hypoxic respiratory failure on 3 L obstructive sleep apnea paroxysmal atrial fibrillation with bundle branch block obstructive sleep apnea on CPAP hypertension hyperlipidemia COPD gout morbid obesity persistent shortness of breath states she was doing okay but developed respiratory fraction found to be COVID over the last 2 weeks or so has gotten progressively worse so came to the emergency room and was advised to be admitted for respiratory management with pulmonary congestion COVID-positive.  Denies any chest pain has felt some palpitations tachycardia irregular heartbeat no chest pain patient has been maintained on Eliquis metoprolol for A-fib as well as GDMT for heart failure  Review of systems complete and found to be negative unless listed above     Past Medical History:  Diagnosis Date   AICD (automatic cardioverter/defibrillator) present    Aortic atherosclerosis (HCC)    Asthma    Chronic respiratory failure (HCC)    CKD (chronic kidney disease), stage III (HCC)    COPD (chronic obstructive pulmonary disease) (HCC)    Deafness in right ear    Diabetes mellitus without complication (HCC)    Dyspnea    GERD (gastroesophageal reflux disease)    Gout    HFrEF (heart failure with reduced ejection fraction) (HCC)    a.) TTE 12/10/14: EF 25%; diff inf HK; sev LV and mod LA dil; LVH. b.) TTE  06/23/18: EF 20-25%; LVH; mild LV dil, mild BAE. c.) TTE 03/01/19: EF 15%, LVH, BAE; triv PR/TR. d.) TTE 02/27/20: EF < 20%; sev LV dil; mild MR. e.) TTE 11/23/20: EF < 20%; glob HK; sev LV dil; LVH; mild-mod BAE; mod-sev TR, triv AR; G1DD. f.) TTE 05/14/21: EF <15%; LVH; sev LA and mild RV enlar; triv AR/PR, mild TR, mod MR.   Hiatal hernia    History of cardiac catheterization    a.) R/LHC 12/14/2009: normal coronaries. b.) R/LHC 12/11/2014: normal coronaries.   Hyperlipidemia    Hypertension    Hypoxemia    LBBB (left bundle branch block)    NICM (nonischemic cardiomyopathy; dilated cardiomyopathy) (HCC)    a.) R/LHC 12/14/2009: normal cors; LVEDP 18 mmHg, mean PA 29 mmHg, mean PCWP 31 mmHg; CO 6 L/min; CI 2.54 L/min/m. b.) TTE 12/10/2014: EF 25%. c.) R/LHC 12/11/2014: mean RA 9 mmHg, mean PA 22 mmHg, mean PCWP 22 mmHg. d.) TTE 06/23/2018: EF 20-25%. e.) TTE 03/01/2019: EF 15%. f.) TTE 02/27/2020: < 20%. g.) TTE 11/23/2020: EF <20%. h.) TTE 05/14/2021: < 15%.   NSTEMI (non-ST elevated myocardial infarction) (HCC)    a.) x 3 per patient report ---> 2012, 2014, 2016   Obesity    On supplemental oxygen by nasal cannula    a.) 2-3 L/[place] PRN   OSA on CPAP    PAF (paroxysmal atrial fibrillation) (HCC)    a.) CHA2DS2-VASc = 4 (HFrEF, HTN, prior MI, T2DM). b.) rate/rhythm maintained without pharmacologial intervention; no current anticoagulation.   Pancreatitis    PSVT (  paroxysmal supraventricular tachycardia) [facility])     Past Surgical History:  Procedure Laterality Date   BIOPSY  11/25/2021   Procedure: BIOPSY;  Surgeon: Meridee Score Netty Starring., MD;  Location: [facility] ENDOSCOPY;  Service: Gastroenterology;;   CARDIAC CATHETERIZATION  12/11/2014   Procedure: RIGHT/LEFT HEART CATH AND CORONARY ANGIOGRAPHY;  Surgeon: Runell Gess, MD;  Location: [facility] CATH LAB;  Service: Cardiovascular;;   COLONOSCOPY WITH PROPOFOL N/A 11/13/2021   Procedure: COLONOSCOPY WITH PROPOFOL;  Surgeon: Toney Reil, MD;   Location: [facility] ENDOSCOPY;  Service: Gastroenterology;  Laterality: N/A;   ESOPHAGOGASTRODUODENOSCOPY (EGD) WITH PROPOFOL N/A 11/25/2021   Procedure: ESOPHAGOGASTRODUODENOSCOPY (EGD) WITH PROPOFOL;  Surgeon: Meridee Score Netty Starring., MD;  Location: [facility] ENDOSCOPY;  Service: Gastroenterology;  Laterality: N/A;   ICD LEAD REMOVAL N/A 03/30/2015   Procedure: ICD LEAD REMOVAL;  Surgeon: Sharion Settler, MD;  Location: [facility] ORS;  Service: Cardiovascular;  Laterality: N/A;   IMPLANTABLE CARDIOVERTER DEFIBRILLATOR IMPLANT     INCISION AND DRAINAGE ABSCESS N/A 12/12/2020   Procedure: INCISION AND DRAINAGE ABSCESS;  Surgeon: Leafy Ro, MD;  Location: [facility] ORS;  Service: General;  Laterality: N/A;   INSERT / REPLACE / REMOVE PACEMAKER     LEFT HEART CATHETERIZATION WITH CORONARY ANGIOGRAM N/A 12/09/2014   Procedure: LEFT HEART CATHETERIZATION WITH CORONARY ANGIOGRAM;  Surgeon: Kathleene Hazel, MD;  Location: [facility] CATH LAB;  Service: Cardiovascular;  Laterality: N/A;   RIGHT/LEFT HEART CATH AND CORONARY ANGIOGRAPHY Bilateral 12/14/2009   Procedure: RIGHT/LEFT HEART CATH AND CORONARY ANGIOGRAPHY; Location: [facility]; Surgeon: Rudean Hitt, MD   TONSILLECTOMY     as a child , adnoids removed   UPPER ESOPHAGEAL ENDOSCOPIC ULTRASOUND (EUS) N/A 11/25/2021   Procedure: UPPER ESOPHAGEAL ENDOSCOPIC ULTRASOUND (EUS);  Surgeon: Lemar Lofty., MD;  Location: [facility];  Service: Gastroenterology;  Laterality: N/A;    Medications Prior to Admission  Medication Sig Dispense Refill Last Dose/Taking   allopurinol (ZYLOPRIM) 100 MG tablet Take 100 mg by mouth daily.   12/03/2023   atorvastatin (LIPITOR) 40 MG tablet Take 40 mg by mouth daily.   12/03/2023   BIMZELX 160 MG/ML pen Inject 2 mLs into the skin as directed. Every 2 weeks   12/03/2023   dapagliflozin propanediol (FARXIGA) 10 MG TABS tablet Take 1 tablet (10 mg total) by mouth daily. 30 tablet 2 12/03/2023   ENTRESTO 24-26 MG Take 0.5 tablets by mouth  2 (two) times daily.   12/03/2023   Fluticasone-Umeclidin-Vilant (TRELEGY ELLIPTA) 100-62.5-25 MCG/ACT AEPB Inhale 1 puff into the lungs daily. 60 each 11 12/04/2023   insulin glargine (LANTUS) 100 UNIT/ML Solostar Pen Inject 15 Units into the skin at bedtime for 7 days. 1.05 mL 0 12/03/2023   metoprolol succinate (TOPROL-XL) 25 MG 24 hr tablet Take 0.5 tablets (12.5 mg total) by mouth daily. 30 tablet 1 12/03/2023   NON FORMULARY Pt uses a cpap nightly   12/03/2023   pantoprazole (PROTONIX) 40 MG tablet Take 1 tablet (40 mg total) by mouth daily.   12/03/2023   predniSONE (DELTASONE) 20 MG tablet Take 2 tablets (40 mg total) by mouth daily with breakfast for 1 day, THEN 1.5 tablets (30 mg total) daily with breakfast for 2 days, THEN 1 tablet (20 mg total) daily with breakfast for 2 days, THEN 0.5 tablets (10 mg total) daily with breakfast for 2 days. 8 tablet 0 12/04/2023   torsemide (DEMADEX) 20 MG tablet Take 40 mg by mouth daily.   12/03/2023   apixaban (ELIQUIS) 5 MG TABS tablet  Take 1 tablet (5 mg total) by mouth 2 (two) times daily. 60 tablet 1    Social History   Socioeconomic History   Marital status: Single    Spouse name: Not on file   Number of children: 0   Years of education: Not on file   Highest education level: Some college, no degree  Occupational History   Occupation: unemployed  Tobacco Use   Smoking status: Former    Current packs/day: 0.00    Average packs/day: 1.5 packs/day for 34.0 years (51.0 ttl pk-yrs)    Types: Cigarettes    Quit date: 05/2023    Years since quitting: 0.5    Passive exposure: Past   Smokeless tobacco: Never   Tobacco comments:    Quit smoking 05/2023  Vaping Use   Vaping status: Never Used  Substance and Sexual Activity   Alcohol use: No   Drug use: No   Sexual activity: Yes  Other Topics Concern   Not on file  Social History Narrative   Live alone   Social Drivers of Health   Financial Resource Strain: Medium Risk (12/02/2023)   Overall  Financial Resource Strain (CARDIA)    Difficulty of Paying Living Expenses: Somewhat hard  Food Insecurity: No Food Insecurity (12/04/2023)   Hunger Vital Sign    Worried About Running Out of Food in the Last Year: Never true    Ran Out of Food in the Last Year: Never true  Recent Concern: Food Insecurity - Food Insecurity Present (11/23/2023)   Received from [facility]   Hunger Vital Sign    Worried About Running Out of Food in the Last Year: Sometimes true    Ran Out of Food in the Last Year: Sometimes true  Transportation Needs: No Transportation Needs (12/04/2023)   PRAPARE - Administrator, Civil Service (Medical): No    Lack of Transportation (Non-Medical): No  Physical Activity: Not on file  Stress: Not on file  Social Connections: Moderately Isolated (12/04/2023)   Social Connection and Isolation Panel [NHANES]    Frequency of Communication with Friends and Family: More than three times a week    Frequency of Social Gatherings with Friends and Family: Once a week    Attends Religious Services: More than 4 times per year    Active Member of [employer] or Organizations: No    Attends Banker Meetings: Never    Marital Status: Never married  Intimate Partner Violence: Not At Risk (12/04/2023)   Humiliation, Afraid, Rape, and Kick questionnaire    Fear of Current or Ex-Partner: No    Emotionally Abused: No    Physically Abused: No    Sexually Abused: No    Family History  Problem Relation Age of Onset   Hypertension Mother    Congestive Heart Failure Mother    Hypertension Sister    Diabetes Sister    Pancreatitis Sister    COPD Sister    Emphysema Sister        smoked   Pancreatitis Brother    Anemia Neg Hx    Arrhythmia Neg Hx    Asthma Neg Hx    Clotting disorder Neg Hx    Fainting Neg Hx    Heart attack Neg Hx    Heart disease Neg Hx    Heart failure Neg Hx    Hyperlipidemia Neg Hx       Review of systems complete and found  to be negative unless  listed above      PHYSICAL EXAM  General: Well developed, well nourished, in no acute distress HEENT:  Normocephalic and atramatic Neck:  No JVD.  Lungs: Clear bilaterally to auscultation and percussion. Heart: HRRR . Normal S1 and S2 without gallops or murmurs.  Abdomen: Bowel sounds are positive, abdomen soft and non-tender  Msk:  Back normal, normal gait. Normal strength and tone for age. Extremities: No clubbing, cyanosis or edema.   Neuro: Alert and oriented X 3. Psych:  Good affect, responds appropriately  Labs:   Lab Results  Component Value Date   WBC 8.9 12/04/2023   HGB 13.5 12/04/2023   HCT 39.1 12/04/2023   MCV 92.9 12/04/2023   PLT 161 12/04/2023    Recent Labs  Lab 12/04/23 0928 12/05/23 0458 12/06/23 0435  NA 137   < > 133*  K 3.9   < > 4.2  CL 101   < > 100  CO2 26   < > 25  BUN 53*   < > 61*  CREATININE 1.86*   < > 1.60*  CALCIUM 8.9   < > 8.9  PROT 8.1  --   --   BILITOT 0.6  --   --   ALKPHOS 150*  --   --   ALT 50*  --   --   AST 48*  --   --   GLUCOSE 215*   < > 235*   < > = values in this interval not displayed.   Lab Results  Component Value Date   CKTOTAL 257 (H) 12/19/2012   CKMB 2.0 08/11/2014   TROPONINI 0.07 (HH) 02/04/2018    Lab Results  Component Value Date   CHOL 223 (H) 09/30/2022   CHOL 247 (H) 09/12/2016   CHOL 234 (H) 07/07/2016   Lab Results  Component Value Date   HDL 35 (L) 09/30/2022   HDL 31 (L) 09/12/2016   HDL 31 (L) 07/07/2016   Lab Results  Component Value Date   LDLCALC 168 (H) 09/30/2022   LDLCALC 177 (H) 09/12/2016   LDLCALC 179 (H) 07/07/2016   Lab Results  Component Value Date   TRIG 98 09/30/2022   TRIG 116 10/03/2021   TRIG 157 (H) 05/19/2017   Lab Results  Component Value Date   CHOLHDL 6.4 09/30/2022   CHOLHDL 8.0 09/12/2016   CHOLHDL 7.5 07/07/2016   No results found for: "LDLDIRECT"    Radiology: DG Chest 1 View Result Date: 12/05/2023 CLINICAL DATA:   48 year old male with CHF, cardiomyopathy. EXAM: CHEST  1 VIEW COMPARISON:  Chest radiographs 12/03/2023 and earlier. FINDINGS: AP view at 0654 hours. Stable left chest AICD. Stable cardiomegaly and mediastinal contours. Visualized tracheal air column is within normal limits. Lung volumes are stable and within normal limits. No overt edema and allowing for portable technique the lungs are clear. No acute osseous abnormality identified. IMPRESSION: Stable cardiomegaly. No acute cardiopulmonary abnormality. Electronically Signed   By: Odessa Fleming M.D.   On: 12/05/2023 07:34   DG Chest 2 View Result Date: 12/03/2023 CLINICAL DATA:  Chest pain, dyspnea EXAM: CHEST - 2 VIEW COMPARISON:  None Available. FINDINGS: Lungs are well expanded, symmetric, and clear. No pneumothorax or pleural effusion. Stable cardiomegaly. Left subclavian dual lead pacemaker defibrillator is unchanged. Pulmonary vascularity is normal. Osseous structures are age-appropriate. No acute bone abnormality. IMPRESSION: 1. No active cardiopulmonary disease.  Stable cardiomegaly. Electronically Signed   By: Helyn Numbers M.D.   On: 12/03/2023 21:59  ECHOCARDIOGRAM COMPLETE Result Date: 12/02/2023    ECHOCARDIOGRAM REPORT   Patient Name:   Oscar [name] Date of Exam: 12/01/2023 Medical Rec #:  [id number]        Height:       65.5 in Accession #:    [id number]       Weight:       308.6 lb Date of Birth:  August 17, 1976       BSA:          2.392 m Patient Age:    47 years         BP:           133/108 mmHg Patient Gender: M                HR:           68 bpm. Exam Location:  [facility] Procedure: 2D Echo, Cardiac Doppler, Color Doppler and Intracardiac            Opacification Agent (Both Spectral and Color Flow Doppler were            utilized during procedure). Indications:     I50.21 Acute Systolic Heart Failure  History:         Patient has prior history of Echocardiogram examinations, most                  recent 10/20/2022. Defibrillator, COPD,  Arrythmias:LBBB,                  Signs/Symptoms:Dyspnea; Risk Factors:Hypertension, Diabetes and                  Dyslipidemia. Nonischemic cardiomyopathy. Obstructive sleep                  apnea-CPAP.  Sonographer:     Daphine Deutscher RDCS Referring Phys:  [id number] Francoise Schaumann [place] Diagnosing Phys: Marcina Millard MD IMPRESSIONS  1. Left ventricular ejection fraction, by estimation, is 30 to 35%. The left ventricle has moderately decreased function. The left ventricle demonstrates regional wall motion abnormalities (see scoring diagram/findings for description). The left ventricular internal cavity size was moderately dilated. Left ventricular diastolic parameters are indeterminate.  2. Right ventricular systolic function is normal. The right ventricular size is normal.  3. The mitral valve is normal in structure. Mild mitral valve regurgitation. No evidence of mitral stenosis.  4. The aortic valve is normal in structure. Aortic valve regurgitation is not visualized. No aortic stenosis is present.  5. The inferior vena cava is normal in size with greater than 50% respiratory variability, suggesting right atrial pressure of 3 mmHg. FINDINGS  Left Ventricle: Left ventricular ejection fraction, by estimation, is 30 to 35%. The left ventricle has moderately decreased function. The left ventricle demonstrates regional wall motion abnormalities. Definity contrast agent was given IV to delineate the left ventricular endocardial borders. Strain was performed and the global longitudinal strain is indeterminate. The left ventricular internal cavity size was moderately dilated. There is no left ventricular hypertrophy. Left ventricular diastolic parameters are indeterminate.  LV Wall Scoring: The apical lateral segment and apex are akinetic. The mid anterolateral segment and apical septal segment are hypokinetic. Right Ventricle: The right ventricular size is normal. No increase in right ventricular wall thickness.  Right ventricular systolic function is normal. Left Atrium: Left atrial size was normal in size. Right Atrium: Right atrial size was normal in size. Pericardium: There is no evidence of pericardial effusion. Mitral Valve: The mitral valve is  normal in structure. Mild mitral valve regurgitation. No evidence of mitral valve stenosis. Tricuspid Valve: The tricuspid valve is normal in structure. Tricuspid valve regurgitation is mild . No evidence of tricuspid stenosis. Aortic Valve: The aortic valve is normal in structure. Aortic valve regurgitation is not visualized. No aortic stenosis is present. Pulmonic Valve: The pulmonic valve was normal in structure. Pulmonic valve regurgitation is not visualized. No evidence of pulmonic stenosis. Aorta: The aortic root is normal in size and structure. Venous: The inferior vena cava is normal in size with greater than 50% respiratory variability, suggesting right atrial pressure of 3 mmHg. IAS/Shunts: No atrial level shunt detected by color flow Doppler. Additional Comments: 3D was performed not requiring image post processing on an independent workstation and was indeterminate.  LEFT VENTRICLE PLAX 2D LVIDd:         8.20 cm Diastology LVIDs:         6.80 cm LV e' medial:    5.38 cm/s LV PW:         0.90 cm LV E/e' medial:  9.9 LV IVS:        0.80 cm LV e' lateral:   6.36 cm/s                        LV E/e' lateral: 8.4  RIGHT VENTRICLE             IVC RV Basal diam:  4.70 cm     IVC diam: 1.70 cm RV S prime:     12.60 cm/s TAPSE (M-mode): 2.7 cm LEFT ATRIUM              Index        RIGHT ATRIUM           Index LA diam:        6.40 cm  2.68 cm/m   RA Area:     15.60 cm LA Vol (A2C):   83.0 ml  34.70 ml/m  RA Volume:   45.30 ml  18.94 ml/m LA Vol (A4C):   113.0 ml 47.24 ml/m LA Biplane Vol: 99.5 ml  41.59 ml/m  AORTIC VALVE LVOT Vmax:   94.47 cm/s LVOT Vmean:  59.633 cm/s LVOT VTI:    0.159 m  AORTA Ao Root diam: 3.70 cm Ao Asc diam:  4.20 cm MITRAL VALVE MV Area (PHT):  4.18 cm    SHUNTS MV Decel Time: 182 msec    Systemic VTI: 0.16 m MV E velocity: 53.55 cm/s MV A velocity: 86.20 cm/s MV E/A ratio:  0.62 Marcina Millard MD Electronically signed by Marcina Millard MD Signature Date/Time: 12/02/2023/1:40:39 PM    Final    DG Chest 2 View Result Date: 12/01/2023 CLINICAL DATA:  Cough. EXAM: CHEST - 2 VIEW COMPARISON:  Chest radiograph dated 11/07/2023. FINDINGS: Stable cardiomegaly and central pulmonary vascular congestion. Stable left-sided pacemaker. No evidence of overt edema. No focal consolidation, pleural effusion, or pneumothorax. No acute osseous abnormality. IMPRESSION: Stable cardiomegaly with central pulmonary vascular congestion. Electronically Signed   By: Hart Robinsons M.D.   On: 12/01/2023 09:51   DG Elbow Complete Right Result Date: 11/07/2023 CLINICAL DATA:  pain EXAM: RIGHT ELBOW - COMPLETE 3+ VIEW COMPARISON:  None Available. FINDINGS: No acute fracture or dislocation. Joint spaces and alignment are maintained. No area of erosion or osseous destruction. No unexpected radiopaque foreign body. Soft tissues are unremarkable. IMPRESSION: No acute fracture or dislocation. Electronically Signed   By: Meda Klinefelter  M.D.   On: 11/07/2023 16:31   DG Chest 2 View Result Date: 11/07/2023 CLINICAL DATA:  Chest pain for 3 days. EXAM: CHEST - 2 VIEW COMPARISON:  June 12, 2023. FINDINGS: Stable cardiomegaly. Left-sided defibrillator is unchanged. Both lungs are clear. The visualized skeletal structures are unremarkable. IMPRESSION: No active cardiopulmonary disease. Electronically Signed   By: Lupita Raider M.D.   On: 11/07/2023 15:14    EKG: Atrial fibrillation bundle branch block rate of 90 occasional PVCs  ASSESSMENT AND PLAN:  Atrial fibrillation Frequent PVCs Nonischemic cardiomyopathy Systolic congestive heart failure chronic COPD Obstructive sleep apnea Morbid obesity AICD in place Acute COVID infection Shortness of  breath . Plan Continue supportive care for COVID-19 infection continue inhalers continue supportive care CPAP BiPAP for obstructive disease Diuretic therapy for heart failure Continue GDMT for heart failure management Recommend weight loss exercise portion control Supplemental oxygen as necessary Agree with amiodarone therapy to help with arrhythmia suppression     Signed: Alwyn Pea MD 12/06/2023, 9:38 AM

## 2023-12-07 DIAGNOSIS — R7989 Other specified abnormal findings of blood chemistry: Secondary | ICD-10-CM | POA: Insufficient documentation

## 2023-12-07 DIAGNOSIS — U071 COVID-19: Secondary | ICD-10-CM | POA: Diagnosis not present

## 2023-12-07 DIAGNOSIS — N179 Acute kidney failure, unspecified: Secondary | ICD-10-CM | POA: Diagnosis not present

## 2023-12-07 DIAGNOSIS — N1831 Chronic kidney disease, stage 3a: Secondary | ICD-10-CM | POA: Diagnosis not present

## 2023-12-07 DIAGNOSIS — J9621 Acute and chronic respiratory failure with hypoxia: Secondary | ICD-10-CM | POA: Diagnosis not present

## 2023-12-07 LAB — CBC
HCT: 38.3 % — ABNORMAL LOW (ref 39.0–52.0)
Hemoglobin: 13.4 g/dL (ref 13.0–17.0)
MCH: 32 pg (ref 26.0–34.0)
MCHC: 35 g/dL (ref 30.0–36.0)
MCV: 91.4 fL (ref 80.0–100.0)
Platelets: 173 10*3/uL (ref 150–400)
RBC: 4.19 MIL/uL — ABNORMAL LOW (ref 4.22–5.81)
RDW: 12.3 % (ref 11.5–15.5)
WBC: 7.3 10*3/uL (ref 4.0–10.5)
nRBC: 0 % (ref 0.0–0.2)

## 2023-12-07 LAB — BASIC METABOLIC PANEL WITH GFR
Anion gap: 9 (ref 5–15)
BUN: 61 mg/dL — ABNORMAL HIGH (ref 6–20)
CO2: 24 mmol/L (ref 22–32)
Calcium: 8.7 mg/dL — ABNORMAL LOW (ref 8.9–10.3)
Chloride: 102 mmol/L (ref 98–111)
Creatinine, Ser: 1.9 mg/dL — ABNORMAL HIGH (ref 0.61–1.24)
GFR, Estimated: 43 mL/min — ABNORMAL LOW (ref >60)
Glucose, Bld: 207 mg/dL — ABNORMAL HIGH (ref 70–99)
Potassium: 4.2 mmol/L (ref 3.5–5.1)
Sodium: 135 mmol/L (ref 135–145)

## 2023-12-07 LAB — GLUCOSE, CAPILLARY
Glucose-Capillary: 175 mg/dL — ABNORMAL HIGH (ref 70–99)
Glucose-Capillary: 196 mg/dL — ABNORMAL HIGH (ref 70–99)
Glucose-Capillary: 227 mg/dL — ABNORMAL HIGH (ref 70–99)
Glucose-Capillary: 268 mg/dL — ABNORMAL HIGH (ref 70–99)

## 2023-12-07 MED ORDER — FUROSEMIDE 40 MG PO TABS: 40.0000 mg | ORAL_TABLET | Freq: Every day | ORAL | Status: DC

## 2023-12-07 MED ORDER — DOXYCYCLINE HYCLATE 100 MG PO TABS
100.0000 mg | ORAL_TABLET | Freq: Two times a day (BID) | ORAL | Status: DC
  Administered 2023-12-07 – 2023-12-08 (×3): 100 mg via ORAL
  Filled 2023-12-07 (×3): qty 1

## 2023-12-07 MED ORDER — METOPROLOL SUCCINATE ER 25 MG PO TB24
25.0000 mg | ORAL_TABLET | Freq: Every day | ORAL | Status: DC
  Administered 2023-12-07 – 2023-12-08 (×2): 25 mg via ORAL
  Filled 2023-12-07 (×2): qty 1

## 2023-12-07 MED ORDER — AMOXICILLIN-POT CLAVULANATE 875-125 MG PO TABS
1.0000 | ORAL_TABLET | Freq: Two times a day (BID) | ORAL | Status: DC
  Administered 2023-12-07 – 2023-12-08 (×3): 1 via ORAL
  Filled 2023-12-07 (×3): qty 1

## 2023-12-07 MED ORDER — METOPROLOL SUCCINATE ER 50 MG PO TB24: 50.0000 mg | ORAL_TABLET | Freq: Every day | ORAL | Status: DC

## 2023-12-07 NOTE — Consult Note (Signed)
 WOC consult DCed Quanika [facility], CNS, CWON-AP [phone]

## 2023-12-07 NOTE — Progress Notes (Signed)
 Progress Note   Patient: Oscar [name] AVW:[id number] DOB: 1976-04-23 DOA: 12/04/2023     2 DOS: the patient was seen and examined on 12/07/2023   Brief hospital course: CHARLIES RAYBURN is a 48 y.o. male with medical history significant of chronic HFrEF with LVEF less than 20%, nonischemic dilated cardiomyopathy, IDDM, CKD stage IIIa, chronic hypoxic respiratory failure on 3 L oxygen continuously, PAF not on anticoagulation, OSA on CPAP, hypertension, HLD, COPD, gout, morbid obesity, presented with worsening of shortness of breath.  Was found to have positive COVID.   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 COVID infection. 4/5.Patient had a worsening short of breath and came back the same day after discharge. Found to have a COVID-positive.  Volume status have improved.  Worsening condition mainly from COPD exacerbation from COVID.  He has received IV steroids, on oral prednisone, I will decrease dose to 20 mg daily.  Scheduled bronchodilator. Antiviral agent not started due to interaction with heart failure treatment. 4/6.  Short of breath has been improving, refused to take additional steroids.  Continue scheduled bronchodilator, discontinue steroids.   4/7.  Patient still feels short of breath, does not feel to go home today.  Will hold him for another day.   Acute on chronic systolic congestive heart failure s/p  AICD. Myocardial injury. Patient received iv Lasix, currently volume status is better.  Renal function is worse after Lasix, will hold off additional diuretics.  Will assess daily for need of diuretics. Patient had chest discomfort on 4/5, improved after giving bronchodilator.  Troponin trend down, not consistent with acute coronary syndrome. Seen by cardiology, holding Lasix for today.   Acute renal failure superimposed on stage IIIa chronic kidney disease. Hyponatremia. Renal function much improved after giving albumin.  Restart Lasix.   Uncontrolled type 2 diabetes with hyperglycemia. Increase the dose of insulin glargine, discontinue steroids.  Continue to follow.   Class III morbid obesity with BMI 55. Obstructive sleep apnea.  On CPAP. Complicated prognosis and treatment.  Diet exercise advised.   Paroxysmal atrial fibrillation with RVR. Patient converted back to atrial fibrillation yesterday, he has some wide-complex tachycardia in the setting of atrial fibrillation.  Wide-complex was due to LBBB. He still has some tachycardia with exertion, increased dose of metoprolol 25 mg daily. Better controlled.  Small Scrotal abscess. Patient had a small scrotal abscess, has drained spontaneously.  Initially had purulent draining, became clear today.  Will treat with 3 days of antibiotics orally.      Subjective:  Patient still has some short of breath, no wheezing.  Physical Exam: Vitals:   12/06/23 2210 12/07/23 0037 12/07/23 0634 12/07/23 0734  BP: (!) 83/63 100/64 (!) 85/60 (!) 155/94  Pulse: 63 (!) 58 71 76  Resp: 15 15 16 16   Temp: 97.7 F (36.5 C) 97.8 F (36.6 C) 98 F (36.7 C) 98 F (36.7 C)  TempSrc: Oral Oral  Oral  SpO2:  95% 96% 98% 100%  Weight:      Height:       General exam: Appears calm and comfortable, morbid obese. Respiratory system: Clear to auscultation. Respiratory effort normal. Cardiovascular system: S1 & S2 heard, RRR. No JVD, murmurs, rubs,  gallops or clicks. No pedal edema. Gastrointestinal system: Abdomen is nondistended, soft and nontender. No organomegaly or masses felt. Normal bowel sounds heard. Central nervous system: Alert and oriented. No focal neurological deficits. Extremities: Symmetric 5 x 5 power. Skin: No rashes, lesions or ulcers Psychiatry: Judgement and insight appear normal. Mood & affect appropriate.    Data Reviewed:  Lab results reviewed.  Family Communication: none  Disposition: Status is: Inpatient Remains inpatient appropriate because: Severity of disease.     Time spent: 35 minutes  Author: Marrion Coy, MD 12/07/2023 9:55 AM  For on call review [email or web address].

## 2023-12-07 NOTE — Plan of Care (Signed)
  Problem: Education: Goal: Knowledge of risk factors and measures for prevention of condition will improve Outcome: Progressing   Problem: Coping: Goal: Psychosocial and spiritual needs will be supported Outcome: Progressing   Problem: Respiratory: Goal: Will maintain a patent airway Outcome: Progressing Goal: Complications related to the disease process, condition or treatment will be avoided or minimized Outcome: Progressing   Problem: Coping: Goal: Ability to adjust to condition or change in health will improve Outcome: Progressing   Problem: Fluid Volume: Goal: Ability to maintain a balanced intake and output will improve Outcome: Progressing   Problem: Health Behavior/Discharge Planning: Goal: Ability to identify and utilize available resources and services will improve Outcome: Progressing Goal: Ability to manage health-related needs will improve Outcome: Progressing   Problem: Metabolic: Goal: Ability to maintain appropriate glucose levels will improve Outcome: Progressing   Problem: Nutritional: Goal: Maintenance of adequate nutrition will improve Outcome: Progressing Goal: Progress toward achieving an optimal weight will improve Outcome: Progressing   Problem: Skin Integrity: Goal: Risk for impaired skin integrity will decrease Outcome: Progressing   Problem: Tissue Perfusion: Goal: Adequacy of tissue perfusion will improve Outcome: Progressing   Problem: Education: Goal: Knowledge of General Education information will improve Description: Including pain rating scale, medication(s)/side effects and non-pharmacologic comfort measures Outcome: Progressing   Problem: Health Behavior/Discharge Planning: Goal: Ability to manage health-related needs will improve Outcome: Progressing   Problem: Clinical Measurements: Goal: Ability to maintain clinical measurements within normal limits will improve Outcome: Progressing Goal: Will remain free from  infection Outcome: Progressing Goal: Diagnostic test results will improve Outcome: Progressing Goal: Respiratory complications will improve Outcome: Progressing Goal: Cardiovascular complication will be avoided Outcome: Progressing   Problem: Activity: Goal: Risk for activity intolerance will decrease Outcome: Progressing   Problem: Nutrition: Goal: Adequate nutrition will be maintained Outcome: Progressing   Problem: Coping: Goal: Level of anxiety will decrease Outcome: Progressing   Problem: Elimination: Goal: Will not experience complications related to bowel motility Outcome: Progressing Goal: Will not experience complications related to urinary retention Outcome: Progressing   Problem: Pain Managment: Goal: General experience of comfort will improve and/or be controlled Outcome: Progressing   Problem: Safety: Goal: Ability to remain free from injury will improve Outcome: Progressing   Problem: Skin Integrity: Goal: Risk for impaired skin integrity will decrease Outcome: Progressing

## 2023-12-07 NOTE — Progress Notes (Signed)
 [facility] CLINIC CARDIOLOGY PROGRESS NOTE   Patient ID: Oscar [name] MRN: [id number] DOB/AGE: Jan 08, 1976 48 y.o.  Admit date: 12/04/2023 Referring Physician Dr. Marrion Coy Primary Physician Revelo, Presley Raddle, MD  Primary Cardiologist Dr. Juliann Pares Reason for Consultation PVCs, tachycardia  HPI: Oscar [name] is a 48 y.o. male with a past medical history of chronic HFrEF, nonischemic cardiomyopathy, LBBB s/p CRT-D, OSA, severe COPD (home O2 2-3L), CKD, type 2 diabetes mellitus, hyperlipidemia, hypertension, paroxysmal atrial fibrillation who presented to the ED on 12/04/2023 for shortness of breath.   Interval History:  -Patient seen and examined this AM, sleeping comfortably in bed on BiPAP.  -Renal function slightly up today. BP borderline.  -HR controlled.   Review of systems complete and found to be negative unless listed above    Vitals:   12/06/23 2210 12/07/23 0037 12/07/23 0634 12/07/23 0734  BP: (!) 83/63 100/64 (!) 85/60 (!) 155/94  Pulse: 63 (!) 58 71 76  Resp: 15 15 16 16   Temp: 97.7 F (36.5 C) 97.8 F (36.6 C) 98 F (36.7 C) 98 F (36.7 C)  TempSrc: Oral Oral  Oral  SpO2: 95% 96% 98% 100%  Weight:      Height:         Intake/Output Summary (Last 24 hours) at 12/07/2023 4098 Last data filed at 12/07/2023 1191 Gross per 24 hour  Intake --  Output 500 ml  Net -500 ml     PHYSICAL EXAM General: Ill appearing male, well nourished, in no acute distress. HEENT: Normocephalic and atraumatic. Neck: No JVD.  Lungs: Normal respiratory effort on BiPAP. Heart: Irregularly irregular, controlled rate. Normal S1 and S2 without gallops or murmurs. Radial & DP pulses 2+ bilaterally. Abdomen: Non-distended appearing.  Msk: Normal strength and tone for age. Extremities: No clubbing, cyanosis or edema.   Neuro: Alert and oriented X 3. Psych: Mood appropriate, affect congruent.    LABS: Basic Metabolic Panel: Recent Labs    12/06/23 0435 12/07/23 0351  NA  133* 135  K 4.2 4.2  CL 100 102  CO2 25 24  GLUCOSE 235* 207*  BUN 61* 61*  CREATININE 1.60* 1.90*  CALCIUM 8.9 8.7*  MG 2.3  --    Liver Function Tests: No results for input(s): "AST", "ALT", "ALKPHOS", "BILITOT", "PROT", "ALBUMIN" in the last 72 hours. No results for input(s): "LIPASE", "AMYLASE" in the last 72 hours. CBC: Recent Labs    12/07/23 0351  WBC 7.3  HGB 13.4  HCT 38.3*  MCV 91.4  PLT 173   Cardiac Enzymes: Recent Labs    12/04/23 1125 12/05/23 0458 12/05/23 1252  TROPONINIHS 121* 100* 92*   BNP: No results for input(s): "BNP" in the last 72 hours. D-Dimer: Recent Labs    12/04/23 0942  DDIMER 0.47   Hemoglobin A1C: No results for input(s): "HGBA1C" in the last 72 hours. Fasting Lipid Panel: No results for input(s): "CHOL", "HDL", "LDLCALC", "TRIG", "CHOLHDL", "LDLDIRECT" in the last 72 hours. Thyroid Function Tests: No results for input(s): "TSH", "T4TOTAL", "T3FREE", "THYROIDAB" in the last 72 hours.  Invalid input(s): "FREET3" Anemia Panel: No results for input(s): "VITAMINB12", "FOLATE", "FERRITIN", "TIBC", "IRON", "RETICCTPCT" in the last 72 hours.  No results found.   ECHO 12/01/2023: 1. Left ventricular ejection fraction, by estimation, is 30 to 35%. The left ventricle has moderately decreased function. The left ventricle demonstrates regional wall motion abnormalities (see scoring diagram/findings for description). The left ventricular internal cavity size was moderately dilated. Left ventricular diastolic parameters are  indeterminate.   2. Right ventricular systolic function is normal. The right ventricular  size is normal.   3. The mitral valve is normal in structure. Mild mitral valve  regurgitation. No evidence of mitral stenosis.   4. The aortic valve is normal in structure. Aortic valve regurgitation is  not visualized. No aortic stenosis is present.   5. The inferior vena cava is normal in size with greater than 50%  respiratory  variability, suggesting right atrial pressure of 3 mmHg.    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 male with a past medical history of chronic HFrEF, nonischemic cardiomyopathy, LBBB s/p CRT-D, OSA, severe COPD (home O2 2-3L), CKD, type 2 diabetes mellitus, hyperlipidemia, hypertension, paroxysmal atrial fibrillation who presented to the ED on 12/04/2023 for shortness of breath.   # COVID # Chronic HFrEF (EF < 20% 10/20/2022) # Nonischemic Cardiomyopathy, LBBB s/p CRT-D  # COPD exacerbation Patient with worsening SOB since DC from admission for COPD exacerbation and acute heart failure. Found to be COVID positive this admission.  BNP 455. Troponin mildly elevated and flat trending. Patient on BiPAP this AM with baseline at 2-3L at home. Echo last admission with EF 30-35%. -Hold po loop diuretic today. Continue farxiga 10 mg daily.  -Increase metoprolol to 25 mg daily. Continue home Entresto 24-26 mg twice daily.  -Manage COPD per primary. -Continue atorvastatin 40 mg daily -Mildly elevated and flat trending troponin likely demand in the setting of acute heart failure and COPD exacerbation. No plan for further cardiac invasive evaluation.    #  Paroxysmal atrial fibrillation -Beta-blocker as above. -Eliquis prescribed since admission. Patient refuses to take Eliquis or any other blood thinners outpatient.  Educated on risk associated with not taking Eliquis and having atrial fibrillation.  This patient's case was discussed and created with Dr. Melton Alar and she is in agreement.  Signed:  Gale Journey, PA-C  12/07/2023, 9:38 AM [facility]

## 2023-12-08 DIAGNOSIS — U071 COVID-19: Secondary | ICD-10-CM | POA: Diagnosis not present

## 2023-12-08 DIAGNOSIS — J441 Chronic obstructive pulmonary disease with (acute) exacerbation: Secondary | ICD-10-CM | POA: Diagnosis not present

## 2023-12-08 DIAGNOSIS — J9621 Acute and chronic respiratory failure with hypoxia: Secondary | ICD-10-CM | POA: Diagnosis not present

## 2023-12-08 LAB — CBC
HCT: 38.5 % — ABNORMAL LOW (ref 39.0–52.0)
Hemoglobin: 13.3 g/dL (ref 13.0–17.0)
MCH: 32.1 pg (ref 26.0–34.0)
MCHC: 34.5 g/dL (ref 30.0–36.0)
MCV: 93 fL (ref 80.0–100.0)
Platelets: 156 10*3/uL (ref 150–400)
RBC: 4.14 MIL/uL — ABNORMAL LOW (ref 4.22–5.81)
RDW: 12.4 % (ref 11.5–15.5)
WBC: 6.2 10*3/uL (ref 4.0–10.5)
nRBC: 0 % (ref 0.0–0.2)

## 2023-12-08 LAB — BASIC METABOLIC PANEL WITH GFR
Anion gap: 6 (ref 5–15)
BUN: 66 mg/dL — ABNORMAL HIGH (ref 6–20)
CO2: 26 mmol/L (ref 22–32)
Calcium: 8.6 mg/dL — ABNORMAL LOW (ref 8.9–10.3)
Chloride: 102 mmol/L (ref 98–111)
Creatinine, Ser: 1.93 mg/dL — ABNORMAL HIGH (ref 0.61–1.24)
GFR, Estimated: 42 mL/min — ABNORMAL LOW (ref >60)
Glucose, Bld: 168 mg/dL — ABNORMAL HIGH (ref 70–99)
Potassium: 4.4 mmol/L (ref 3.5–5.1)
Sodium: 134 mmol/L — ABNORMAL LOW (ref 135–145)

## 2023-12-08 LAB — GLUCOSE, CAPILLARY
Glucose-Capillary: 151 mg/dL — ABNORMAL HIGH (ref 70–99)
Glucose-Capillary: 197 mg/dL — ABNORMAL HIGH (ref 70–99)

## 2023-12-08 MED ORDER — AMIODARONE HCL 200 MG PO TABS: 200.0000 mg | ORAL_TABLET | Freq: Two times a day (BID) | ORAL | 0 refills | Status: DC

## 2023-12-08 MED ORDER — METOPROLOL SUCCINATE ER 25 MG PO TB24: 25.0000 mg | ORAL_TABLET | Freq: Every day | ORAL | Status: DC

## 2023-12-08 MED ORDER — IPRATROPIUM-ALBUTEROL 20-100 MCG/ACT IN AERS: 2.0000 | INHALATION_SPRAY | Freq: Four times a day (QID) | RESPIRATORY_TRACT | 0 refills | Status: DC | PRN

## 2023-12-08 MED ORDER — AMOXICILLIN-POT CLAVULANATE 875-125 MG PO TABS: 1.0000 | ORAL_TABLET | Freq: Two times a day (BID) | ORAL | 0 refills | Status: AC

## 2023-12-08 MED ORDER — DOXYCYCLINE HYCLATE 100 MG PO TABS: 100.0000 mg | ORAL_TABLET | Freq: Two times a day (BID) | ORAL | 0 refills | Status: AC

## 2023-12-08 NOTE — Plan of Care (Signed)

## 2023-12-08 NOTE — Discharge Summary (Signed)
 Physician Discharge Summary   Patient: Oscar [name] MRN: [id number] DOB: 1976/08/25  Admit date:     12/04/2023  Discharge date: 12/08/23  Discharge Physician: Marrion Coy   PCP: Preston Fleeting, MD   Recommendations at discharge:   PCP in 1 week. Follow-up with cardiology as scheduled. BMP at next office visit.  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 *  Hospital Course: Oscar [name] is a 48 y.o. male with medical history significant of chronic HFrEF with LVEF less than 20%, nonischemic dilated cardiomyopathy, IDDM, CKD stage IIIa, chronic hypoxic respiratory failure on 3 L oxygen continuously, PAF not on anticoagulation, OSA on CPAP, hypertension, HLD, COPD, gout, morbid obesity, presented with worsening of shortness of breath.  Was found to have positive COVID. She initially received IV Lasix, started volume status better afterwards.  Patient had a COPD exacerbation from COVID, condition has improved.  Medically stable for discharge.  Assessment and Plan: Acute on chronic respiratory failure with hypoxemia COPD exacerbation secondary to COVID infection. COVID infection. 4/5.Patient had a worsening short of breath and came back the same day after discharge. Found to have a COVID-positive.  Volume status have improved.  Worsening condition mainly from COPD exacerbation from COVID.  He has received IV steroids, on oral prednisone, I will decrease dose to 20  mg daily.  Scheduled bronchodilator. Antiviral agent not started due to interaction with heart failure treatment. 4/6.  Short of breath has been improving, refused to take additional steroids.  Continue scheduled bronchodilator, discontinue steroids.   4/7.  Patient still feels short of breath, does not feel to go home today.  Will hold him for another day. 4/8.  Shortness of breath much improved today, medically stable for discharge.   Acute on chronic systolic congestive heart failure s/p AICD. Myocardial injury. Patient received iv Lasix, currently volume status is better.  Renal function is worse after Lasix, will hold off additional diuretics.  Will assess daily for need of diuretics. Patient had chest discomfort on 4/5, improved after giving bronchodilator.  Troponin trend down, not consistent with acute coronary syndrome. Continue to hold diuretics and Entresto due to renal function.   Acute renal failure superimposed on stage IIIa chronic kidney disease. Hyponatremia. Renal function slightly worsened after diuretics.  Recheck a BMP at the next office visit.   Uncontrolled type 2 diabetes with hyperglycemia. Increase the dose of insulin glargine, discontinue steroids.  Continue to follow.   Class III morbid obesity with BMI 55. Obstructive sleep apnea.  On CPAP. Complicated prognosis and treatment.  Diet exercise advised.   Paroxysmal atrial fibrillation with RVR. Patient converted back to atrial fibrillation yesterday, he has some wide-complex tachycardia in the setting of atrial fibrillation.  Wide-complex was due to LBBB. He still has some tachycardia with exertion, increased dose of metoprolol 25 mg daily.    Small Scrotal abscess. Patient had a small scrotal abscess, has drained spontaneously.  Initially had purulent draining, became clear today.  Will treat with 3 days of antibiotics orally.            Consultants: Cardiology Procedures performed: None   Disposition: Home Diet recommendation:  Discharge Diet Orders (From admission, onward)     Start     Ordered   12/08/23 0000  Diet - low sodium heart healthy        12/08/23 0930           Cardiac diet DISCHARGE MEDICATION: Allergies as of 12/08/2023       Reactions   Ciprofloxacin Swelling, Other (See Comments)   Migraine Headache   Iodinated Contrast Media    Other reaction(s): Vomiting Projectile vomiting   Isosorb Dinitrate-hydralazine Other (See Comments)   Migraine Headache        Medication List     STOP taking these medications    Entresto 24-26 MG Generic drug: sacubitril-valsartan   predniSONE 20 MG tablet Commonly known as: DELTASONE   torsemide 20 MG tablet Commonly known as: DEMADEX       TAKE these medications    allopurinol 100 MG tablet Commonly known as: ZYLOPRIM Take 100 mg by mouth daily.   amiodarone 200 MG tablet Commonly known as: PACERONE Take 1 tablet (200 mg total) by mouth 2 (two) times daily.   amoxicillin-clavulanate 875-125 MG tablet Commonly known as: AUGMENTIN Take 1 tablet by mouth every 12 (twelve) hours for 2 days.   apixaban 5 MG Tabs tablet Commonly known as: ELIQUIS Take 1 tablet (5 mg total) by mouth 2 (two) times daily.   atorvastatin 40 MG tablet Commonly known as: LIPITOR Take 40 mg by mouth daily.   Bimzelx 160 MG/ML pen Generic drug: bimekizumab-bkzx Inject 2 mLs into the skin as directed. Every 2 weeks   dapagliflozin propanediol 10 MG Tabs tablet Commonly known as: FARXIGA Take 1 tablet (10 mg total) by mouth daily.   doxycycline 100 MG tablet Commonly known as: VIBRA-TABS Take 1 tablet (100 mg total) by mouth every 12 (twelve) hours for 2 days.   insulin glargine 100 UNIT/ML Solostar Pen Commonly known as: LANTUS Inject 15 Units into the skin at bedtime for 7 days.   Ipratropium-Albuterol 20-100 MCG/ACT Aers respimat Commonly known as: COMBIVENT Inhale 2 puffs into the lungs every 6  (six) hours as needed for wheezing.   metoprolol succinate 25 MG 24 hr tablet Commonly known as: TOPROL-XL Take 1 tablet (25 mg total) by mouth daily. What changed: how much to take   NON FORMULARY Pt uses a cpap nightly   pantoprazole 40 MG tablet Commonly known as: PROTONIX Take 1 tablet (40 mg total) by mouth daily.   Trelegy Ellipta 100-62.5-25 MCG/ACT Aepb Generic drug: Fluticasone-Umeclidin-Vilant Inhale 1 puff into the lungs daily.        Follow-up Information     Revelo, Presley Raddle, MD Follow up in 1 week(s).   Specialty: Family Medicine Why: Hospital follow up Contact information: [address] [place] [address] [phone]         Alwyn Pea, MD Follow up.   Specialties: Cardiology, Internal Medicine Why: Hospital follow-up scheduled with Dr. Juliann Pares on 4/10 Contact information: [address] [place] [address] [phone]         [facility]. Go on 12/16/2023.   Specialty: Cardiology Why: Hospital Follow-Up 12/16/23 @ 8:30 am Please bring all medications to follow-up appointment [facility], [address] Second Floor Free Valet Parking at the door  Contact information: [address] [place] [address] [phone]               Discharge Exam: [name] Weights   12/04/23 4696  Weight: (!) 150 kg   General exam: Appears calm and comfortable, morbid obese Respiratory system: Clear to auscultation. Respiratory effort normal. Cardiovascular system: S1 & S2 heard, RRR. No JVD, murmurs, rubs, gallops or clicks. No pedal edema. Gastrointestinal system: Abdomen is nondistended, soft and nontender. No organomegaly or masses felt. Normal bowel sounds heard. Central nervous system: Alert and oriented. No focal neurological deficits. Extremities: Symmetric 5 x 5 power. Skin: No rashes, lesions or ulcers Psychiatry: Judgement and  insight appear normal. Mood & affect appropriate.    Condition at discharge: good  The results of significant diagnostics from this hospitalization (including imaging, microbiology, ancillary and laboratory) are listed below for reference.   Imaging Studies: DG Chest 1 View Result Date: 12/05/2023 CLINICAL DATA:  48 year old male with CHF, cardiomyopathy. EXAM: CHEST  1 VIEW COMPARISON:  Chest radiographs 12/03/2023 and earlier. FINDINGS: AP view at 0654 hours. Stable left chest AICD. Stable cardiomegaly and mediastinal contours. Visualized tracheal air column is within normal limits. Lung volumes are stable and within normal limits. No overt edema and allowing for portable technique the lungs are clear. No acute osseous abnormality identified. IMPRESSION: Stable cardiomegaly. No acute cardiopulmonary abnormality. Electronically Signed   By: Odessa Fleming M.D.   On: 12/05/2023 07:34   DG Chest 2 View Result Date: 12/03/2023 CLINICAL DATA:  Chest pain, dyspnea EXAM: CHEST - 2 VIEW COMPARISON:  None Available. FINDINGS: Lungs are well expanded, symmetric, and clear. No pneumothorax or pleural effusion. Stable cardiomegaly. Left subclavian dual lead pacemaker defibrillator is unchanged. Pulmonary vascularity is normal. Osseous structures are age-appropriate. No acute bone abnormality. IMPRESSION: 1. No active cardiopulmonary disease.  Stable cardiomegaly. Electronically Signed   By: Helyn Numbers M.D.   On: 12/03/2023 21:59   ECHOCARDIOGRAM COMPLETE Result Date: 12/02/2023    ECHOCARDIOGRAM REPORT   Patient Name:   JAQUAY POSTHUMUS Date of Exam: 12/01/2023 Medical Rec #:  [id number]        Height:       65.5 in Accession #:    [id number]       Weight:       308.6 lb Date of Birth:  [birth year]       BSA:          2.392 m Patient Age:    47 years         BP:           133/108 mmHg Patient Gender: M                HR:           68 bpm. Exam Location:  [facility] Procedure: 2D Echo, Cardiac Doppler, Color Doppler and  Intracardiac            Opacification Agent (Both Spectral and Color Flow Doppler were            utilized during procedure). Indications:     I50.21 Acute Systolic Heart Failure  History:         Patient has prior history of Echocardiogram examinations, most                  recent 10/20/2022. Defibrillator, COPD, Arrythmias:LBBB,                  Signs/Symptoms:Dyspnea; Risk  Factors:Hypertension, Diabetes and                  Dyslipidemia. Nonischemic cardiomyopathy. Obstructive sleep                  apnea-CPAP.  Sonographer:     Daphine Deutscher RDCS Referring Phys:  [id number] Francoise Schaumann [place] Diagnosing Phys: Marcina Millard MD IMPRESSIONS  1. Left ventricular ejection fraction, by estimation, is 30 to 35%. The left ventricle has moderately decreased function. The left ventricle demonstrates regional wall motion abnormalities (see scoring diagram/findings for description). The left ventricular internal cavity size was moderately dilated. Left ventricular diastolic parameters are indeterminate.  2. Right ventricular systolic function is normal. The right ventricular size is normal.  3. The mitral valve is normal in structure. Mild mitral valve regurgitation. No evidence of mitral stenosis.  4. The aortic valve is normal in structure. Aortic valve regurgitation is not visualized. No aortic stenosis is present.  5. The inferior vena cava is normal in size with greater than 50% respiratory variability, suggesting right atrial pressure of 3 mmHg. FINDINGS  Left Ventricle: Left ventricular ejection fraction, by estimation, is 30 to 35%. The left ventricle has moderately decreased function. The left ventricle demonstrates regional wall motion abnormalities. Definity contrast agent was given IV to delineate the left ventricular endocardial borders. Strain was performed and the global longitudinal strain is indeterminate. The left ventricular internal cavity size was moderately dilated. There is no left ventricular  hypertrophy. Left ventricular diastolic parameters are indeterminate.  LV Wall Scoring: The apical lateral segment and apex are akinetic. The mid anterolateral segment and apical septal segment are hypokinetic. Right Ventricle: The right ventricular size is normal. No increase in right ventricular wall thickness. Right ventricular systolic function is normal. Left Atrium: Left atrial size was normal in size. Right Atrium: Right atrial size was normal in size. Pericardium: There is no evidence of pericardial effusion. Mitral Valve: The mitral valve is normal in structure. Mild mitral valve regurgitation. No evidence of mitral valve stenosis. Tricuspid Valve: The tricuspid valve is normal in structure. Tricuspid valve regurgitation is mild . No evidence of tricuspid stenosis. Aortic Valve: The aortic valve is normal in structure. Aortic valve regurgitation is not visualized. No aortic stenosis is present. Pulmonic Valve: The pulmonic valve was normal in structure. Pulmonic valve regurgitation is not visualized. No evidence of pulmonic stenosis. Aorta: The aortic root is normal in size and structure. Venous: The inferior vena cava is normal in size with greater than 50% respiratory variability, suggesting right atrial pressure of 3 mmHg. IAS/Shunts: No atrial level shunt detected by color flow Doppler. Additional Comments: 3D was performed not requiring image post processing on an independent workstation and was indeterminate.  LEFT VENTRICLE PLAX 2D LVIDd:         8.20 cm Diastology LVIDs:         6.80 cm LV e' medial:    5.38 cm/s LV PW:         0.90 cm LV E/e' medial:  9.9 LV IVS:        0.80 cm LV e' lateral:   6.36 cm/s                        LV E/e' lateral: 8.4  RIGHT VENTRICLE             IVC RV Basal diam:  4.70 cm     IVC diam: 1.70 cm RV S prime:  12.60 cm/s TAPSE (M-mode): 2.7 cm LEFT ATRIUM              Index        RIGHT ATRIUM           Index LA diam:        6.40 cm  2.68 cm/m   RA Area:     15.60  cm LA Vol (A2C):   83.0 ml  34.70 ml/m  RA Volume:   45.30 ml  18.94 ml/m LA Vol (A4C):   113.0 ml 47.24 ml/m LA Biplane Vol: 99.5 ml  41.59 ml/m  AORTIC VALVE LVOT Vmax:   94.47 cm/s LVOT Vmean:  59.633 cm/s LVOT VTI:    0.159 m  AORTA Ao Root diam: 3.70 cm Ao Asc diam:  4.20 cm MITRAL VALVE MV Area (PHT): 4.18 cm    SHUNTS MV Decel Time: 182 msec    Systemic VTI: 0.16 m MV E velocity: 53.55 cm/s MV A velocity: 86.20 cm/s MV E/A ratio:  0.62 Marcina Millard MD Electronically signed by Marcina Millard MD Signature Date/Time: 12/02/2023/1:40:39 PM    Final    DG Chest 2 View Result Date: 12/01/2023 CLINICAL DATA:  Cough. EXAM: CHEST - 2 VIEW COMPARISON:  Chest radiograph dated 11/07/2023. FINDINGS: Stable cardiomegaly and central pulmonary vascular congestion. Stable left-sided pacemaker. No evidence of overt edema. No focal consolidation, pleural effusion, or pneumothorax. No acute osseous abnormality. IMPRESSION: Stable cardiomegaly with central pulmonary vascular congestion. Electronically Signed   By: Hart Robinsons M.D.   On: 12/01/2023 09:51    Microbiology: Results for orders placed or performed during the hospital encounter of 12/04/23  Resp panel by RT-PCR (RSV, Flu A&B, Covid) Anterior Nasal Swab     Status: Abnormal   Collection Time: 12/04/23  9:29 AM   Specimen: Anterior Nasal Swab  Result Value Ref Range Status   SARS Coronavirus 2 by RT PCR POSITIVE (A) NEGATIVE Final    Comment: (NOTE) SARS-CoV-2 target nucleic acids are DETECTED.  The SARS-CoV-2 RNA is generally detectable in upper respiratory specimens during the acute phase of infection. Positive results are indicative of the presence of the identified virus, but do not rule out bacterial infection or co-infection with other pathogens not detected by the test. Clinical correlation with patient history and other diagnostic information is necessary to determine patient infection status. The expected result is  Negative.  Fact Sheet for Patients: BloggerCourse.com  Fact Sheet for Healthcare Providers: SeriousBroker.it  This test is not yet approved or cleared by the [place] FDA and  has been authorized for detection and/or diagnosis of SARS-CoV-2 by FDA under an Emergency Use Authorization (EUA).  This EUA will remain in effect (meaning this test can be used) for the duration of  the COVID-19 declaration under Section 564(b)(1) of the A ct, 21 U.S.C. section 360bbb-3(b)(1), unless the authorization is terminated or revoked sooner.     Influenza A by PCR NEGATIVE NEGATIVE Final   Influenza B by PCR NEGATIVE NEGATIVE Final    Comment: (NOTE) The Xpert Xpress SARS-CoV-2/FLU/RSV plus assay is intended as an aid in the diagnosis of influenza from Nasopharyngeal swab specimens and should not be used as a sole basis for treatment. Nasal washings and aspirates are unacceptable for Xpert Xpress SARS-CoV-2/FLU/RSV testing.  Fact Sheet for Patients: BloggerCourse.com  Fact Sheet for Healthcare Providers: SeriousBroker.it  This test is not yet approved or cleared by the [place] FDA and has been authorized for detection and/or diagnosis of  SARS-CoV-2 by FDA under an Emergency Use Authorization (EUA). This EUA will remain in effect (meaning this test can be used) for the duration of the COVID-19 declaration under Section 564(b)(1) of the Act, 21 U.S.C. section 360bbb-3(b)(1), unless the authorization is terminated or revoked.     Resp Syncytial Virus by PCR NEGATIVE NEGATIVE Final    Comment: (NOTE) Fact Sheet for Patients: BloggerCourse.com  Fact Sheet for Healthcare Providers: SeriousBroker.it  This test is not yet approved or cleared by the [place] FDA and has been authorized for detection and/or diagnosis of SARS-CoV-2  by FDA under an Emergency Use Authorization (EUA). This EUA will remain in effect (meaning this test can be used) for the duration of the COVID-19 declaration under Section 564(b)(1) of the Act, 21 U.S.C. section 360bbb-3(b)(1), unless the authorization is terminated or revoked.  Performed at [facility], [address] [place], [place] [address]     Labs: CBC: Recent Labs  Lab 12/02/23 [phone] 12/03/23 2143 12/04/23 0928 12/07/23 0351 12/08/23 0442  WBC 6.2 10.0 8.9 7.3 6.2  HGB 12.8* 13.4 13.5 13.4 13.3  HCT 36.1* 38.0* 39.1 38.3* 38.5*  MCV 90.7 94.3 92.9 91.4 93.0  PLT 141* 161 161 173 156   Basic Metabolic Panel: Recent Labs  Lab 12/04/23 0928 12/05/23 0458 12/06/23 0435 12/07/23 0351 12/08/23 0442  NA 137 134* 133* 135 134*  K 3.9 4.5 4.2 4.2 4.4  CL 101 101 100 102 102  CO2 26 23 25 24 26   GLUCOSE 215* 257* 235* 207* 168*  BUN 53* 61* 61* 61* 66*  CREATININE 1.86* 2.05* 1.60* 1.90* 1.93*  CALCIUM 8.9 8.7* 8.9 8.7* 8.6*  MG  --   --  2.3  --   --    Liver Function Tests: Recent Labs  Lab 12/02/23 0638 12/04/23 0928  AST 33 48*  ALT 34 50*  ALKPHOS 176* 150*  BILITOT 0.6 0.6  PROT 8.3* 8.1  ALBUMIN 3.3* 3.6   CBG: Recent Labs  Lab 12/07/23 0738 12/07/23 1228 12/07/23 1659 12/07/23 2053 12/08/23 0837  GLUCAP 175* 196* 227* 268* 151*    Discharge time spent: greater than 30 minutes.  Signed: Marrion Coy, MD [facility] 12/08/2023

## 2023-12-08 NOTE — TOC Transition Note (Addendum)
 Transition of Care [facility]) - Discharge Note   Patient Details  Name: Oscar [name] MRN: [id number] Date of Birth: [birth year]  [facility]) CM/SW Contact:  Cherre Blanc, RN Phone Number: 12/08/2023, 10:03 AM   Clinical Narrative:     Patient is medically clear for dc to home. Nurse outreached to [facility] advising the patient stated he is on 2 L [place] at home and doesn't have a portable O2 tank. TOC called referral to Mitch with [employer] who accepted the referral. [employer] will deliver the tank to the room.  No other TOC needs identified.  Final next level of care: Home/Self Care Barriers to Discharge: Continued Medical Work up   Patient Goals and CMS Choice            Discharge Placement                       Discharge Plan and Services Additional resources added to the After Visit Summary for                                       Social Drivers of Health (SDOH) Interventions SDOH Screenings   Food Insecurity: No Food Insecurity (12/04/2023)  Recent Concern: Food Insecurity - Food Insecurity Present (11/23/2023)   Received from [facility]  Housing: Low Risk  (12/04/2023)  Recent Concern: Housing - High Risk (11/23/2023)   Received from [facility]  Transportation Needs: No Transportation Needs (12/04/2023)  Utilities: Not At Risk (12/04/2023)  Depression (PHQ2-9): Low Risk  (10/17/2021)  Financial Resource Strain: Medium Risk (12/02/2023)  Social Connections: Moderately Isolated (12/04/2023)  Tobacco Use: Medium Risk (12/04/2023)     Readmission Risk Interventions     No data to display

## 2023-12-08 NOTE — Progress Notes (Signed)
 [facility] CLINIC CARDIOLOGY PROGRESS NOTE   Patient ID: Oscar [name] MRN: [id number] DOB/AGE: 1976/02/07 48 y.o.  Admit date: 12/04/2023 Referring Physician Dr. Marrion Coy Primary Physician Revelo, Presley Raddle, MD  Primary Cardiologist Dr. Juliann Pares Reason for Consultation PVCs, tachycardia  HPI: Oscar [name] is a 48 y.o. male with a past medical history of chronic HFrEF, nonischemic cardiomyopathy, LBBB s/p CRT-D, OSA, severe COPD (home O2 2-3L), CKD, type 2 diabetes mellitus, hyperlipidemia, hypertension, paroxysmal atrial fibrillation who presented to the ED on 12/04/2023 for shortness of breath.   Interval History:  -Patient seen and examined this AM, sitting upright in bedside chair. -Renal function stable today. BP borderline. No dizziness. -HR controlled. No palpitations or CP.  Review of systems complete and found to be negative unless listed above    Vitals:   12/07/23 2047 12/08/23 0106 12/08/23 0606 12/08/23 0837  BP: 118/78 97/60 99/60  (!) 112/57  Pulse: 60 62 64 81  Resp:    16  Temp: 98 F (36.7 C) 97.6 F (36.4 C)  (!) 97.4 F (36.3 C)  TempSrc: Oral Oral    SpO2: 100% 99% 100% 100%  Weight:      Height:         Intake/Output Summary (Last 24 hours) at 12/08/2023 9562 Last data filed at 12/08/2023 0546 Gross per 24 hour  Intake 480 ml  Output 1000 ml  Net -520 ml     PHYSICAL EXAM General: Chronically Ill appearing male, well nourished, in no acute distress. HEENT: Normocephalic and atraumatic. Neck: No JVD.  Lungs: Normal respiratory effort on [place]. Heart: Irregularly irregular, controlled rate. Normal S1 and S2 without gallops or murmurs. Radial & DP pulses 2+ bilaterally. Abdomen: Non-distended appearing.  Msk: Normal strength and tone for age. Extremities: No clubbing, cyanosis or edema.   Neuro: Alert and oriented X 3. Psych: Mood appropriate, affect congruent.    LABS: Basic Metabolic Panel: Recent Labs    12/06/23 0435  12/07/23 0351 12/08/23 0442  NA 133* 135 134*  K 4.2 4.2 4.4  CL 100 102 102  CO2 25 24 26   GLUCOSE 235* 207* 168*  BUN 61* 61* 66*  CREATININE 1.60* 1.90* 1.93*  CALCIUM 8.9 8.7* 8.6*  MG 2.3  --   --    Liver Function Tests: No results for input(s): "AST", "ALT", "ALKPHOS", "BILITOT", "PROT", "ALBUMIN" in the last 72 hours. No results for input(s): "LIPASE", "AMYLASE" in the last 72 hours. CBC: Recent Labs    12/07/23 0351 12/08/23 0442  WBC 7.3 6.2  HGB 13.4 13.3  HCT 38.3* 38.5*  MCV 91.4 93.0  PLT 173 156   Cardiac Enzymes: Recent Labs    12/05/23 1252  TROPONINIHS 92*   BNP: No results for input(s): "BNP" in the last 72 hours. D-Dimer: No results for input(s): "DDIMER" in the last 72 hours.  Hemoglobin A1C: No results for input(s): "HGBA1C" in the last 72 hours. Fasting Lipid Panel: No results for input(s): "CHOL", "HDL", "LDLCALC", "TRIG", "CHOLHDL", "LDLDIRECT" in the last 72 hours. Thyroid Function Tests: No results for input(s): "TSH", "T4TOTAL", "T3FREE", "THYROIDAB" in the last 72 hours.  Invalid input(s): "FREET3" Anemia Panel: No results for input(s): "VITAMINB12", "FOLATE", "FERRITIN", "TIBC", "IRON", "RETICCTPCT" in the last 72 hours.  No results found.   ECHO 12/01/2023: 1. Left ventricular ejection fraction, by estimation, is 30 to 35%. The left ventricle has moderately decreased function. The left ventricle demonstrates regional wall motion abnormalities (see scoring diagram/findings for description). The left ventricular  internal cavity size was moderately dilated. Left ventricular diastolic parameters are indeterminate.   2. Right ventricular systolic function is normal. The right ventricular  size is normal.   3. The mitral valve is normal in structure. Mild mitral valve  regurgitation. No evidence of mitral stenosis.   4. The aortic valve is normal in structure. Aortic valve regurgitation is  not visualized. No aortic stenosis is  present.   5. The inferior vena cava is normal in size with greater than 50%  respiratory variability, suggesting right atrial pressure of 3 mmHg.    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 male with a past medical history of chronic HFrEF, nonischemic cardiomyopathy, LBBB s/p CRT-D, OSA, severe COPD (home O2 2-3L), CKD, type 2 diabetes mellitus, hyperlipidemia, hypertension, paroxysmal atrial fibrillation who presented to the ED on 12/04/2023 for shortness of breath.   # COVID # Chronic HFrEF (EF < 20% 10/20/2022) # Nonischemic Cardiomyopathy, LBBB s/p CRT-D  # COPD exacerbation Patient with worsening SOB since DC from admission for COPD exacerbation and acute heart failure. Found to be COVID positive this admission.  BNP 455. Troponin mildly elevated and flat trending. Patient on BiPAP this AM with baseline at 2-3L at home. Echo last admission with EF 30-35%. -Hold po loop diuretic today. Continue farxiga 10 mg daily.  Resume p.o. diuretic on discharge. -Continue metoprolol to 25 mg daily. Continue home Entresto 24-26 mg twice daily.  -Manage COPD per primary. -Continue atorvastatin 40 mg daily -Mildly elevated and flat trending troponin  likely demand in the setting of acute heart failure and COPD exacerbation. No plan for further cardiac invasive evaluation.    # Paroxysmal atrial fibrillation -Beta-blocker as above. -Eliquis prescribed since admission. Patient refuses to take Eliquis or any other blood thinners outpatient.  Educated on risk associated with not taking Eliquis and having atrial fibrillation.  Ok for discharge today from a cardiac perspective. Will arrange for follow up in clinic with Dr. Juliann Pares on 4/10.   This patient's case was discussed and created with Dr. Melton Alar and she is in agreement.  Signed:  Gale Journey, PA-C  12/08/2023, 9:37 AM [facility] Cardiology

## 2023-12-08 NOTE — Care Management Important Message (Signed)
 Important Message  Patient Details  Name: Oscar [name] MRN: [id number] Date of Birth: 1976-06-03   Important Message Given:  Yes - Medicare IM     Cristela Blue, CMA 12/08/2023, 9:59 AM

## 2023-12-09 ENCOUNTER — Encounter: Admitting: Family

## 2023-12-10 ENCOUNTER — Ambulatory Visit: Payer: 59 | Admitting: Pulmonary Disease

## 2023-12-11 ENCOUNTER — Telehealth: Payer: Self-pay | Admitting: Pulmonary Disease

## 2023-12-11 NOTE — Telephone Encounter (Signed)
 Offered April 23rd with Rhunette Croft in [place]. Pt hung up

## 2023-12-15 ENCOUNTER — Telehealth: Payer: Self-pay | Admitting: Family

## 2023-12-15 NOTE — Progress Notes (Unsigned)
 Advanced Heart Failure Clinic Note   PCP: Presley Raddle, MD (last seen 01/25) Primary Cardiologist: Dorothyann Peng, MD (last seen 10/24) HF provider: Cheral Bay, MD (last seen 05/24)  Chief Complaint: shortness of breath  HPI:  Mr Quesinberry is a 48 y/o male with a history of obstructive sleep apnea (currently with CPAP), MI, HTN, VT (successful shock X 3), CKD, COPD, PAF, gout, hyperlipidemia, GERD, DM, SVT,asthma, chronic LBBB and chronic heart failure with CRT-D (2016).  Stress test 03/01/2019: Abnormal myocardial perfusion scan no clear evidence of reversible ischemia inferior persistent defect either scar versus body habitus artifact severely dilated left ventricular function severely depressed LV function with ejection fraction of around 11%. Recommend medical therapy   Echo 03/01/2019: EF of 15% along with mild MR.  Echo 02/27/20: EF of <20% Echo 11/23/20: EF of <20% along with moderate LVH and moderately elevated PA pressure. Echo 10/20/22: EF <20% along with mildly elevated PA pressure and mild MR.   Was in the ED 04/27/23 due to groin abscess X 1 month with drainage. CT done with f/u to be done with surgeon.   Admitted 06/12/23 due to ICD firing after sudden onset of SOB, chest tightness and dizziness. He was able to stand up and make it to the bathroom when he felt his ICD firing again. Afterwards, his shortness of breath, chest pain and dizziness resolved. He called EMS and on route to the ED, he was told that his ICD fired again. Successful conversion after the 3rd shock. Cardiology consulted. Needed amiodarone drip with transition to or amiodarone.   Was in the ED 11/07/23 with right elbow pain. Has a history of gout. Was given colchicine but has to wait 3 days before starting because he had been taking it daily for several weeks.  He presents today for a HF follow-up visit with a chief complaint of minimal shortness of breath with moderate exertion. Has fatigue, sternal chest  pain, occasional palpitations and right elbow pain along with this. Denies cough, palpitations, abdominal distention, pedal edema, dizziness, difficulty sleeping or weight gain. Has been off amiodarone and carvedilol since 10/24.  Has also not been on jardiance for "awhile". Has upcoming cMRI next week with f/u with [facility] cardiology.   ROS: All systems negative except as listed in HPI, PMH and Problem List.  SH:  Social History   Socioeconomic History   Marital status: Single    Spouse name: Not on file   Number of children: 0   Years of education: Not on file   Highest education level: Some college, no degree  Occupational History   Occupation: unemployed  Tobacco Use   Smoking status: Former    Current packs/day: 0.00    Average packs/day: 1.5 packs/day for 34.0 years (51.0 ttl pk-yrs)    Types: Cigarettes    Quit date: 05/2023    Years since quitting: 0.6    Passive exposure: Past   Smokeless tobacco: Never   Tobacco comments:    Quit smoking 05/2023  Vaping Use   Vaping status: Never Used  Substance and Sexual Activity   Alcohol use: No   Drug use: No   Sexual activity: Yes  Other Topics Concern   Not on file  Social History Narrative   Live alone   Social Drivers of Health   Financial Resource Strain: Medium Risk (12/02/2023)   Overall Financial Resource Strain (CARDIA)    Difficulty of Paying Living Expenses: Somewhat hard  Food Insecurity: No Food Insecurity (12/04/2023)  Hunger Vital Sign    Worried About Running Out of Food in the Last Year: Never true    Ran Out of Food in the Last Year: Never true  Recent Concern: Food Insecurity - Food Insecurity Present (11/23/2023)   Received from [facility]   Hunger Vital Sign    Worried About Running Out of Food in the Last Year: Sometimes true    Ran Out of Food in the Last Year: Sometimes true  Transportation Needs: No Transportation Needs (12/04/2023)   PRAPARE - Scientist, research (physical sciences) (Medical): No    Lack of Transportation (Non-Medical): No  Physical Activity: Not on file  Stress: Not on file  Social Connections: Moderately Isolated (12/04/2023)   Social Connection and Isolation Panel [NHANES]    Frequency of Communication with Friends and Family: More than three times a week    Frequency of Social Gatherings with Friends and Family: Once a week    Attends Religious Services: More than 4 times per year    Active Member of [employer] or Organizations: No    Attends Banker Meetings: Never    Marital Status: Never married  Intimate Partner Violence: Not At Risk (12/04/2023)   Humiliation, Afraid, Rape, and Kick questionnaire    Fear of Current or Ex-Partner: No    Emotionally Abused: No    Physically Abused: No    Sexually Abused: No    FH:  Family History  Problem Relation Age of Onset   Hypertension Mother    Congestive Heart Failure Mother    Hypertension Sister    Diabetes Sister    Pancreatitis Sister    COPD Sister    Emphysema Sister        smoked   Pancreatitis Brother    Anemia Neg Hx    Arrhythmia Neg Hx    Asthma Neg Hx    Clotting disorder Neg Hx    Fainting Neg Hx    Heart attack Neg Hx    Heart disease Neg Hx    Heart failure Neg Hx    Hyperlipidemia Neg Hx     Past Medical History:  Diagnosis Date   AICD (automatic cardioverter/defibrillator) present    Aortic atherosclerosis (HCC)    Asthma    Chronic respiratory failure (HCC)    CKD (chronic kidney disease), stage III (HCC)    COPD (chronic obstructive pulmonary disease) (HCC)    Deafness in right ear    Diabetes mellitus without complication (HCC)    Dyspnea    GERD (gastroesophageal reflux disease)    Gout    HFrEF (heart failure with reduced ejection fraction) (HCC)    a.) TTE 12/10/14: EF 25%; diff inf HK; sev LV and mod LA dil; LVH. b.) TTE 06/23/18: EF 20-25%; LVH; mild LV dil, mild BAE. c.) TTE 03/01/19: EF 15%, LVH, BAE; triv PR/TR. d.) TTE 02/27/20:  EF < 20%; sev LV dil; mild MR. e.) TTE 11/23/20: EF < 20%; glob HK; sev LV dil; LVH; mild-mod BAE; mod-sev TR, triv AR; G1DD. f.) TTE 05/14/21: EF <15%; LVH; sev LA and mild RV enlar; triv AR/PR, mild TR, mod MR.   Hiatal hernia    History of cardiac catheterization    a.) R/LHC 12/14/2009: normal coronaries. b.) R/LHC 12/11/2014: normal coronaries.   Hyperlipidemia    Hypertension    Hypoxemia    LBBB (left bundle branch block)    NICM (nonischemic cardiomyopathy; dilated cardiomyopathy) (HCC)  a.) R/LHC 12/14/2009: normal cors; LVEDP 18 mmHg, mean PA 29 mmHg, mean PCWP 31 mmHg; CO 6 L/min; CI 2.54 L/min/m. b.) TTE 12/10/2014: EF 25%. c.) R/LHC 12/11/2014: mean RA 9 mmHg, mean PA 22 mmHg, mean PCWP 22 mmHg. d.) TTE 06/23/2018: EF 20-25%. e.) TTE 03/01/2019: EF 15%. f.) TTE 02/27/2020: < 20%. g.) TTE 11/23/2020: EF <20%. h.) TTE 05/14/2021: < 15%.   NSTEMI (non-ST elevated myocardial infarction) (HCC)    a.) x 3 per patient report ---> 2012, 2014, 2016   Obesity    On supplemental oxygen by nasal cannula    a.) 2-3 L/ PRN   OSA on CPAP    PAF (paroxysmal atrial fibrillation) (HCC)    a.) CHA2DS2-VASc = 4 (HFrEF, HTN, prior MI, T2DM). b.) rate/rhythm maintained without pharmacologial intervention; no current anticoagulation.   Pancreatitis    PSVT (paroxysmal supraventricular tachycardia) (HCC)     Current Outpatient Medications  Medication Sig Dispense Refill   allopurinol (ZYLOPRIM) 100 MG tablet Take 100 mg by mouth daily.     amiodarone (PACERONE) 200 MG tablet Take 1 tablet (200 mg total) by mouth 2 (two) times daily. 60 tablet 0   apixaban (ELIQUIS) 5 MG TABS tablet Take 1 tablet (5 mg total) by mouth 2 (two) times daily. 60 tablet 1   atorvastatin (LIPITOR) 40 MG tablet Take 40 mg by mouth daily.     BIMZELX 160 MG/ML pen Inject 2 mLs into the skin as directed. Every 2 weeks     dapagliflozin propanediol (FARXIGA) 10 MG TABS tablet Take 1 tablet (10 mg total) by mouth daily. 30  tablet 2   Fluticasone-Umeclidin-Vilant (TRELEGY ELLIPTA) 100-62.5-25 MCG/ACT AEPB Inhale 1 puff into the lungs daily. 60 each 11   insulin glargine (LANTUS) 100 UNIT/ML Solostar Pen Inject 15 Units into the skin at bedtime for 7 days. 1.05 mL 0   Ipratropium-Albuterol (COMBIVENT) 20-100 MCG/ACT AERS respimat Inhale 2 puffs into the lungs every 6 (six) hours as needed for wheezing. 4 g 0   metoprolol succinate (TOPROL-XL) 25 MG 24 hr tablet Take 1 tablet (25 mg total) by mouth daily.     NON FORMULARY Pt uses a cpap nightly     pantoprazole (PROTONIX) 40 MG tablet Take 1 tablet (40 mg total) by mouth daily.     No current facility-administered medications for this visit.   There were no vitals filed for this visit.  Wt Readings from Last 3 Encounters:  12/04/23 (!) 330 lb 12.8 oz (150 kg)  12/03/23 (!) 305 lb 1.9 oz (138.4 kg)  12/03/23 (!) 305 lb 1.9 oz (138.4 kg)   Lab Results  Component Value Date   CREATININE 1.93 (H) 12/08/2023   CREATININE 1.90 (H) 12/07/2023   CREATININE 1.60 (H) 12/06/2023    PHYSICAL EXAM:  General: Well appearing. No resp difficulty HEENT: normal Neck: supple, no JVD Cor: Regular rhythm, rate. No rubs, gallops or murmurs Lungs: clear Abdomen: soft, nontender, nondistended. Extremities: no cyanosis, clubbing, rash, edema; reports tenderness and limited ROM in right elbow Neuro: alert & oriented X 3. Moves all 4 extremities w/o difficulty. Affect pleasant   ECG: not done   ASSESSMENT & PLAN:  1: Chronic heart failure with reduced ejection fraction- - NYHA class II - euvolemic - weighing daily; Reminded to call for an overnight weight gain of >2 pounds or a weekly weight gain of >5 pounds.  - weight stable from last visit here 6 weeks ago - Stress test 03/01/2019:  Abnormal myocardial perfusion scan no clear evidence of reversible ischemia inferior persistent defect either scar versus body habitus artifact severely dilated left ventricular  function severely depressed LV function with ejection fraction of around 11%.   - Echo 03/01/2019: EF of 15% along with mild MR.  - Echo 02/27/20: EF of <20% - Echo 11/23/20: EF of <20% along with moderate LVH and moderately elevated PA pressure. - Echo 10/20/22: EF <20% along with mildly elevated PA pressure and mild MR.  - AICD (2016) in place but denies any shocks "in a long time" - continue entresto 1/2 tablet of 24/26mg  BID  - continue torsemide 40mg  daily; consider decreasing this to allow for BP elevation - has been off carvedilol and jardiance for "awhile" due to hypotension - in the past, spironolactone has caused hypotension - saw HF provider Corie Diamond) 05/24 - BNP 06/12/23 was 464.6  2: HTN with CKD- - BP 96/77 - saw PCP (Mancheno) at [facility] ~ 2 months ago - BMP 11/07/23 reviewed and showed sodium 131, potassium 4.4, Cr 1.68 and GFR 50 - saw nephrology Milta Alosa) 12/24  3: Obstructive sleep apnea- - wears nocturnal ventilator QHS, ~ 6-8 hours/night; reports sleeping well - continues wearing oxygen at 3L at bedtime & PRN during the day - saw pulmonology Viva Grise) 02/25  4: Diabetes- - A1c 06/13/23 was 7.3% - continue atorvastatin 40mg  daily  5: Tobacco use- - has not smoked cigarettes since 10/24 - is chewing 1 piece of nicotine gum when he "gets the urge" - complete cessation discussed for 3 minutes  6: Atrial fibrillation- - saw cardiology Beau Bound) 10/24 - has not been on amiodarone in "awhile"  7: VT- - continue magnesium 400mg  daily - magnesium 06/13/23 was 2.0  - Mg 10/26/23 reviewed and was 2.1   Offered to make appt PRN since he's seeing cardiology and HF provider with [facility] but he prefers to continue coming. Emphasized the importance of bringing medication bottles to every visit and to also take the bottles to other provider appointments. Explained my concern that no one's medication list may be 100% accurate. Cardiology note from 10/24 has multiple  meds listed that he's no longer taking and we have meds not listed that he says he's taking although he can't remember names of them. He verbalizes understanding.   Charlette Console, FNP 12/15/23

## 2023-12-15 NOTE — Telephone Encounter (Signed)
 Called to confirm/remind patient of their appointment at the Advanced Heart Failure Clinic on 12/16/23***.   Appointment:   [] Confirmed  [x] Left mess   [] No answer/No voice mail  [] Phone not in service  Patient reminded to bring all medications and/or complete list.  Confirmed patient has transportation. Gave directions, instructed to utilize valet parking.

## 2023-12-16 ENCOUNTER — Other Ambulatory Visit
Admission: RE | Admit: 2023-12-16 | Discharge: 2023-12-16 | Disposition: A | Discharge status: Home / Self Care | Source: Ambulatory Visit | Attending: Family | Admitting: Family

## 2023-12-16 ENCOUNTER — Ambulatory Visit: Admitting: Family

## 2023-12-16 ENCOUNTER — Telehealth: Payer: Self-pay | Admitting: Family

## 2023-12-16 ENCOUNTER — Encounter: Payer: Self-pay | Admitting: Family

## 2023-12-16 VITALS — BP 118/79 | HR 76 | Wt 309.4 lb

## 2023-12-16 DIAGNOSIS — G4733 Obstructive sleep apnea (adult) (pediatric): Secondary | ICD-10-CM

## 2023-12-16 DIAGNOSIS — N183 Chronic kidney disease, stage 3 unspecified: Secondary | ICD-10-CM | POA: Insufficient documentation

## 2023-12-16 DIAGNOSIS — E785 Hyperlipidemia, unspecified: Secondary | ICD-10-CM | POA: Diagnosis not present

## 2023-12-16 DIAGNOSIS — Z95810 Presence of automatic (implantable) cardiac defibrillator: Secondary | ICD-10-CM | POA: Insufficient documentation

## 2023-12-16 DIAGNOSIS — I472 Ventricular tachycardia, unspecified: Secondary | ICD-10-CM

## 2023-12-16 DIAGNOSIS — I252 Old myocardial infarction: Secondary | ICD-10-CM | POA: Diagnosis not present

## 2023-12-16 DIAGNOSIS — I130 Hypertensive heart and chronic kidney disease with heart failure and stage 1 through stage 4 chronic kidney disease, or unspecified chronic kidney disease: Secondary | ICD-10-CM | POA: Diagnosis not present

## 2023-12-16 DIAGNOSIS — I480 Paroxysmal atrial fibrillation: Secondary | ICD-10-CM | POA: Diagnosis not present

## 2023-12-16 DIAGNOSIS — E119 Type 2 diabetes mellitus without complications: Secondary | ICD-10-CM

## 2023-12-16 DIAGNOSIS — Z794 Long term (current) use of insulin: Secondary | ICD-10-CM

## 2023-12-16 DIAGNOSIS — Z79899 Other long term (current) drug therapy: Secondary | ICD-10-CM | POA: Insufficient documentation

## 2023-12-16 DIAGNOSIS — E1122 Type 2 diabetes mellitus with diabetic chronic kidney disease: Secondary | ICD-10-CM | POA: Diagnosis not present

## 2023-12-16 DIAGNOSIS — I447 Left bundle-branch block, unspecified: Secondary | ICD-10-CM | POA: Diagnosis not present

## 2023-12-16 DIAGNOSIS — I10 Essential (primary) hypertension: Secondary | ICD-10-CM

## 2023-12-16 DIAGNOSIS — I5022 Chronic systolic (congestive) heart failure: Secondary | ICD-10-CM | POA: Diagnosis present

## 2023-12-16 DIAGNOSIS — K219 Gastro-esophageal reflux disease without esophagitis: Secondary | ICD-10-CM | POA: Diagnosis not present

## 2023-12-16 DIAGNOSIS — J4489 Other specified chronic obstructive pulmonary disease: Secondary | ICD-10-CM | POA: Insufficient documentation

## 2023-12-16 LAB — BASIC METABOLIC PANEL WITH GFR
Anion gap: 8 (ref 5–15)
BUN: 51 mg/dL — ABNORMAL HIGH (ref 6–20)
CO2: 29 mmol/L (ref 22–32)
Calcium: 9.2 mg/dL (ref 8.9–10.3)
Chloride: 100 mmol/L (ref 98–111)
Creatinine, Ser: 1.7 mg/dL — ABNORMAL HIGH (ref 0.61–1.24)
GFR, Estimated: 49 mL/min — ABNORMAL LOW (ref >60)
Glucose, Bld: 208 mg/dL — ABNORMAL HIGH (ref 70–99)
Potassium: 4.1 mmol/L (ref 3.5–5.1)
Sodium: 137 mmol/L (ref 135–145)

## 2023-12-16 NOTE — Progress Notes (Signed)
 [facility] - HEART FAILURE CLINIC - PHARMACIST COUNSELING NOTE  Adherence Assessment  Do you ever forget to take your medication? [] Yes [x] No  Do you ever skip doses due to side effects?  [] Yes [x] No  Do you have trouble affording your medicines? [] Yes [x] No  Are you ever unable to pick up your medication due to transportation difficulties? [] Yes [x] No  Do you ever stop taking your medications because you don't believe they are helping? [] Yes [x] No  Do you check your weight daily? [x] Yes [] No  Do you check your blood pressure daily? [] Yes [x] No  Adherence strategy: Pill box  Barriers to obtaining medications: None reported   Vital signs: HR 76, BP 118/79, weight 309 lbs.  ECHO: Date 12/02/23, EF 30-35% Renal function: Date 12/08/23, GFR 42  Current Guideline-Directed Medical Therapy/Evidence Based Medicine  ACE/ARB/ARNI: Sacubitril-valsartan 24-26 mg twice daily Target dose: 97/103 mg twice daily  Beta Blocker: Metoprolol succinate 25 mg daily Target dose: 200 mg daily  Aldosterone Antagonist:  N/A Target dose: N/A  SGLT2i: Dapagliflozin 10 mg daily Target dose: 10 mg daily   Diuretic: Torsemide 20 mg daily  ASSESSMENT 48 year old male who presents to the HF clinic for a follow-up appointment. PMH is significant for obstructive sleep apnea (currently with CPAP), MI, HTN, VT (successful shock X 3), CKD, COPD, PAF, gout, hyperlipidemia, GERD, IDDM, SVT,asthma, chronic LBBB and chronic heart failure with CRT-D (2016). Brought bag of medications with him to appointment but it did not have everything in it. Changes were made to medications since recently discharged from the hospital and there seems to have been some confusion about what should and should not have been stopped. Unable to determine what exactly patient is taking at home during appointment.   Recent ED visit or hospitalization (past 6 months):  Date - 12/04/23 - 12/08/23, [facility] - SOB, Admission Dx -  COPDe, COVID Date - 12/01/23 - 12/03/23, CC - cough, Admission Dx - acute on chronic combined CHF, COPDe Date - 11/07/23, CC - chest pain  Date - 08/03/23, CC - cyst  Date - 06/21/33, CC - Dizziness, fatigue, abscess  PLAN Continue current regimen as directed by NP Saw cardiology yesterday who started Farxiga 10 mg daily and switched metoprolol to carvedilol 3.125 mg BID Consider eventual addition of spironolactone if/when BP allows (had been on previously but this caused hypotension)  Time spent: 20 minutes  Pansy Bogus, PharmD Pharmacy Resident  12/17/2023 9:37 PM

## 2023-12-16 NOTE — Patient Instructions (Signed)
 Please be sure to bring in all your medications bottles to every appointment.   Lab Work:  Go DOWN to LOWER LEVEL (LL) to have your blood work completed inside of [employer] office.  We will only call you if the results are abnormal or if the provider would like to make medication changes.   Follow-Up in: 3-4 weeks with Shawnee Dellen, FNP.  At the Advanced Heart Failure Clinic, you and your health needs are our priority. We have a designated team specialized in the treatment of Heart Failure. This Care Team includes your primary Heart Failure Specialized Cardiologist (physician), Advanced Practice Providers (APPs- Physician Assistants and Nurse Practitioners), and Pharmacist who all work together to provide you with the care you need, when you need it.   You may see any of the following providers on your designated Care Team at your next follow up:  Dr. Jules Oar Dr. Peder Bourdon Dr. Alwin Baars Dr. Judyth Nunnery Shawnee Dellen, FNP Bevely Brush, RPH-CPP   Need to Contact Us :  If you have any questions or concerns before your next appointment please send us  a message through [facility] or call our office at [phone].    TO LEAVE A MESSAGE FOR THE NURSE SELECT OPTION 2, PLEASE LEAVE A MESSAGE INCLUDING: YOUR NAME DATE OF BIRTH CALL BACK NUMBER REASON FOR CALL**this is important as we prioritize the call backs  YOU WILL RECEIVE A CALL BACK THE SAME DAY AS LONG AS YOU CALL BEFORE 4:00 PM

## 2023-12-16 NOTE — Telephone Encounter (Signed)
 Received a message from PCP asking if we would place cardiac rehab referral. Will ask nursing staff to place referral.

## 2023-12-21 ENCOUNTER — Other Ambulatory Visit: Payer: Self-pay

## 2023-12-21 MED ORDER — TORSEMIDE 20 MG PO TABS
20.0000 mg | ORAL_TABLET | Freq: Every day | ORAL | Status: DC
Start: 2023-12-21 — End: 2024-02-04

## 2023-12-21 NOTE — Telephone Encounter (Signed)
-----   Message from Charlette Console sent at 12/16/2023 12:47 PM EDT ----- Potassium is normal and kidney function is improving. With starting farxiga , decrease torsemide  to 20mg  daily (currently taking 40mg ). Will recheck labs at next visit.

## 2023-12-21 NOTE — Progress Notes (Signed)
 Cardiac rehab referral placed per Shawnee Dellen, FNP at pt's request.

## 2023-12-22 ENCOUNTER — Ambulatory Visit: Admitting: Podiatry

## 2023-12-23 ENCOUNTER — Telehealth: Payer: Self-pay

## 2023-12-23 ENCOUNTER — Ambulatory Visit: Admitting: Nurse Practitioner

## 2023-12-23 ENCOUNTER — Other Ambulatory Visit (HOSPITAL_COMMUNITY): Payer: Self-pay

## 2023-12-23 ENCOUNTER — Encounter: Payer: Self-pay | Admitting: Nurse Practitioner

## 2023-12-23 VITALS — BP 108/68 | HR 66 | Ht 65.0 in | Wt 306.8 lb

## 2023-12-23 DIAGNOSIS — I5020 Unspecified systolic (congestive) heart failure: Secondary | ICD-10-CM | POA: Diagnosis not present

## 2023-12-23 DIAGNOSIS — J441 Chronic obstructive pulmonary disease with (acute) exacerbation: Secondary | ICD-10-CM

## 2023-12-23 DIAGNOSIS — Z87891 Personal history of nicotine dependence: Secondary | ICD-10-CM

## 2023-12-23 DIAGNOSIS — J9610 Chronic respiratory failure, unspecified whether with hypoxia or hypercapnia: Secondary | ICD-10-CM | POA: Diagnosis not present

## 2023-12-23 DIAGNOSIS — G4733 Obstructive sleep apnea (adult) (pediatric): Secondary | ICD-10-CM

## 2023-12-23 DIAGNOSIS — J449 Chronic obstructive pulmonary disease, unspecified: Secondary | ICD-10-CM

## 2023-12-23 MED ORDER — ALBUTEROL SULFATE HFA 108 (90 BASE) MCG/ACT IN AERS: 2.0000 | INHALATION_SPRAY | Freq: Four times a day (QID) | RESPIRATORY_TRACT | 2 refills | Status: DC | PRN

## 2023-12-23 MED ORDER — DOXYCYCLINE HYCLATE 100 MG PO TABS: 100.0000 mg | ORAL_TABLET | Freq: Two times a day (BID) | ORAL | 0 refills | Status: AC

## 2023-12-23 NOTE — Progress Notes (Signed)
 @Patient  ID: Oscar [name], male    DOB: Jan 22, 1976, 48 y.o.   MRN: [id number]  Chief Complaint  Patient presents with   Follow-up    Hospital follow up[ and needing supplies for cpap     Referring provider: Kenton Pearl*  HPI: 48 year old male, former smoker followed for OHS/OSA and COPD. He is a patient of Dr. Isidro Margo and last seen in office 10/26/2023. Past medical history significant for HFrEF, a fib RVR on Eliquis , HTN, NICM s/p ICD/pacemaker, hepatic cirrhosis, DM insulin  dependent, CKD, obesity, gout, HLD.   TEST/EVENTS:   12/23/2023: Today - hospital follow up Discussed the use of AI scribe software for clinical note transcription with the patient, who gave verbal consent to proceed.  History of Present Illness   He is a 48 year old male with COPD who presents for follow-up after recent hospitalizations for acute HF exacerbation and a COPD flare-up.  He was hospitalized twice this month. The first hospitalization was due to fluid overload from acute CHF exacerbation from 4/1-4/3. He was discharged only to be readmitted the same day due to COVID-19 and AECOPD. During the second hospitalization, he was treated with steroids. Antiviral was held due to interaction with HF treatment. His chest x-ray was normal, and his fluid status was stable during this admission. He was discharged 12/08/2023.   Since returning home, he feels 'fine' but experiences occasional grogginess. His energy levels have not fully returned, which he attributes to the recent hospitalizations and COVID. His breathing feels better and back to his baseline. He is currently using Trelegy once daily and does not have a rescue inhaler right now. He would like to have one. He   He has a productive cough with dark cream to white sputum still. Has some nasal congestion and drainage still. No hemoptysis, fevers, chills. He uses oxygen some days due to wheezing but not checking his oxygen levels. He does sleep  with it nightly bled through his NIV. He needs a new order for supplies for his machine.   His weight has been stable, with a recent weight of 306 pounds, down from 309 pounds at a cardiology visit last week. No leg swelling beyond normal and no new or worsening symptoms.     11/15/2023-12/14/2023 NIV Download Min ventilation 11.8 l/min Avg breaths 20.4  16/30 days; 11.7 h average use   Allergies  Allergen Reactions   Ciprofloxacin  Swelling and Other (See Comments)    Migraine Headache    Iodinated Contrast Media     Other reaction(s): Vomiting Projectile vomiting   Isosorb Dinitrate-Hydralazine  Other (See Comments)    Migraine Headache    Immunization History  Administered Date(s) Administered   Influenza-Unspecified 03/02/2023   Moderna Sars-Covid-2 Vaccination 12/31/2019, 02/04/2020    Past Medical History:  Diagnosis Date   AICD (automatic cardioverter/defibrillator) present    Aortic atherosclerosis (HCC)    Asthma    Chronic respiratory failure (HCC)    CKD (chronic kidney disease), stage III (HCC)    COPD (chronic obstructive pulmonary disease) (HCC)    Deafness in right ear    Diabetes mellitus without complication (HCC)    Dyspnea    GERD (gastroesophageal reflux disease)    Gout    HFrEF (heart failure with reduced ejection fraction) (HCC)    a.) TTE 12/10/14: EF 25%; diff inf HK; sev LV and mod LA dil; LVH. b.) TTE 06/23/18: EF 20-25%; LVH; mild LV dil, mild BAE. c.) TTE 03/01/19: EF 15%, LVH, BAE;  triv PR/TR. d.) TTE 02/27/20: EF < 20%; sev LV dil; mild MR. e.) TTE 11/23/20: EF < 20%; glob HK; sev LV dil; LVH; mild-mod BAE; mod-sev TR, triv AR; G1DD. f.) TTE 05/14/21: EF <15%; LVH; sev LA and mild RV enlar; triv AR/PR, mild TR, mod MR.   Hiatal hernia    History of cardiac catheterization    a.) R/LHC 12/14/2009: normal coronaries. b.) R/LHC 12/11/2014: normal coronaries.   Hyperlipidemia    Hypertension    Hypoxemia    LBBB (left bundle branch block)    NICM  (nonischemic cardiomyopathy; dilated cardiomyopathy) (HCC)    a.) R/LHC 12/14/2009: normal cors; LVEDP 18 mmHg, mean PA 29 mmHg, mean PCWP 31 mmHg; CO 6 L/min; CI 2.54 L/min/m. b.) TTE 12/10/2014: EF 25%. c.) R/LHC 12/11/2014: mean RA 9 mmHg, mean PA 22 mmHg, mean PCWP 22 mmHg. d.) TTE 06/23/2018: EF 20-25%. e.) TTE 03/01/2019: EF 15%. f.) TTE 02/27/2020: < 20%. g.) TTE 11/23/2020: EF <20%. h.) TTE 05/14/2021: < 15%.   NSTEMI (non-ST elevated myocardial infarction) (HCC)    a.) x 3 per patient report ---> 2012, 2014, 2016   Obesity    On supplemental oxygen by nasal cannula    a.) 2-3 L/[place] PRN   OSA on CPAP    PAF (paroxysmal atrial fibrillation) (HCC)    a.) CHA2DS2-VASc = 4 (HFrEF, HTN, prior MI, T2DM). b.) rate/rhythm maintained without pharmacologial intervention; no current anticoagulation.   Pancreatitis    PSVT (paroxysmal supraventricular tachycardia) (HCC)     Tobacco History: Social History   Tobacco Use  Smoking Status Former   Current packs/day: 0.00   Average packs/day: 1.5 packs/day for 34.0 years (51.0 ttl pk-yrs)   Types: Cigarettes   Quit date: 05/2023   Years since quitting: 0.6   Passive exposure: Past  Smokeless Tobacco Never  Tobacco Comments   Quit smoking 05/2023   Counseling given: Not Answered Tobacco comments: Quit smoking 05/2023   Outpatient Medications Prior to Visit  Medication Sig Dispense Refill   allopurinol  (ZYLOPRIM ) 100 MG tablet Take 100 mg by mouth daily.     amiodarone  (PACERONE ) 200 MG tablet Take 1 tablet (200 mg total) by mouth 2 (two) times daily. (Patient taking differently: Take 200 mg by mouth daily.) 60 tablet 0   apixaban  (ELIQUIS ) 5 MG TABS tablet Take 1 tablet (5 mg total) by mouth 2 (two) times daily. 60 tablet 1   atorvastatin  (LIPITOR ) 40 MG tablet Take 40 mg by mouth daily.     BIMZELX 160 MG/ML pen Inject 2 mLs into the skin as directed. Every 2 weeks     carvedilol  (COREG ) 3.125 MG tablet Take 3.125 mg by mouth 2 (two)  times daily with a meal.     colchicine  0.6 MG tablet Take 0.6 mg by mouth daily.     dapagliflozin  propanediol (FARXIGA ) 10 MG TABS tablet Take 1 tablet (10 mg total) by mouth daily. 30 tablet 2   Fluticasone -Umeclidin-Vilant (TRELEGY ELLIPTA ) 100-62.5-25 MCG/ACT AEPB Inhale 1 puff into the lungs daily. 60 each 11   Ipratropium-Albuterol  (COMBIVENT ) 20-100 MCG/ACT AERS respimat Inhale 2 puffs into the lungs every 6 (six) hours as needed for wheezing. 4 g 0   NON FORMULARY Pt uses a cpap nightly     pantoprazole  (PROTONIX ) 40 MG tablet Take 1 tablet (40 mg total) by mouth daily.     sacubitril -valsartan  (ENTRESTO ) 24-26 MG Take by mouth 2 (two) times daily. Taking 1/2 tablet     torsemide  (DEMADEX )  20 MG tablet Take 1 tablet (20 mg total) by mouth daily.     insulin  glargine (LANTUS ) 100 UNIT/ML Solostar Pen Inject 15 Units into the skin at bedtime for 7 days. 1.05 mL 0   No facility-administered medications prior to visit.     Review of Systems:   Constitutional: No weight loss or gain, night sweats, fevers, chills, or lassitude. +fatigue  HEENT: No headaches, difficulty swallowing, tooth/dental problems, or sore throat. No sneezing, itching, ear ache +nasal congestion, post nasal drip CV:  No chest pain, orthopnea, PND, swelling in lower extremities, anasarca, dizziness, palpitations, syncope Resp: +baseline shortness of breath with exertion; productive cough; occasional wheeze. No hemoptysis. No chest wall deformity GI:  No heartburn, indigestion, abdominal pain, nausea, vomiting, diarrhea, change in bowel habits, loss of appetite, bloody stools.  GU: No dysuria, change in color of urine, urgency or frequency.   Skin: No rash, lesions, ulcerations MSK:  No joint pain or swelling.   Neuro: No dizziness or lightheadedness.  Psych: No depression or anxiety. Mood stable.     Physical Exam:  BP 108/68   Pulse 66   Ht 5\' 5"  (1.651 m)   Wt (!) 306 lb 12.8 oz (139.2 kg)   SpO2 97%    BMI 51.05 kg/m   GEN: Pleasant, interactive, well-kempt; obese; in no acute distress HEENT:  Normocephalic and atraumatic. PERRLA. Sclera white. Nasal turbinates pink, moist and patent bilaterally. No rhinorrhea present. Oropharynx pink and moist, without exudate or edema. No lesions, ulcerations, or postnasal drip.  NECK:  Supple w/ fair ROM. No JVD present. No lymphadenopathy.   CV: RRR, no m/r/g, no peripheral edema. Pulses intact, +2 bilaterally. No cyanosis, pallor or clubbing. PULMONARY:  Unlabored, regular breathing. Diminished bibasilar airflow otherwise clear bilaterally A&P w/o wheezes/rales/rhonchi. No accessory muscle use.  GI: BS present and normoactive. Soft, non-tender to palpation. No organomegaly or masses detected.  MSK: No erythema, warmth or tenderness. Cap refil <2 sec all extrem. No deformities or joint swelling noted.  Neuro: A/Ox3. No focal deficits noted.   Skin: Warm, no lesions or rashe Psych: Normal affect and behavior. Judgement and thought content appropriate.     Lab Results:  CBC    Component Value Date/Time   WBC 6.2 12/08/2023 0442   RBC 4.14 (L) 12/08/2023 0442   HGB 13.3 12/08/2023 0442   HGB 14.3 08/22/2014 0358   HCT 38.5 (L) 12/08/2023 0442   HCT 43.1 08/22/2014 0358   PLT 156 12/08/2023 0442   PLT 179 08/22/2014 0358   MCV 93.0 12/08/2023 0442   MCV 98 08/22/2014 0358   MCH 32.1 12/08/2023 0442   MCHC 34.5 12/08/2023 0442   RDW 12.4 12/08/2023 0442   RDW 12.6 08/22/2014 0358   LYMPHSABS 1.5 08/03/2023 0853   LYMPHSABS 2.5 08/22/2014 0358   MONOABS 0.5 08/03/2023 0853   MONOABS 0.3 08/22/2014 0358   EOSABS 0.1 08/03/2023 0853   EOSABS 0.1 08/22/2014 0358   BASOSABS 0.0 08/03/2023 0853   BASOSABS 0.0 08/22/2014 0358    BMET    Component Value Date/Time   NA 137 12/16/2023 0951   NA 140 10/18/2015 0000   NA 135 (L) 08/22/2014 0358   K 4.1 12/16/2023 0951   K 4.1 08/22/2014 0358   CL 100 12/16/2023 0951   CL 103 08/22/2014 0358    CO2 29 12/16/2023 0951   CO2 27 08/22/2014 0358   GLUCOSE 208 (H) 12/16/2023 0951   GLUCOSE 138 (H) 08/22/2014 0358  BUN 51 (H) 12/16/2023 0951   BUN 11 10/18/2015 0000   BUN 21 (H) 08/22/2014 0358   CREATININE 1.70 (H) 12/16/2023 0951   CREATININE 1.08 08/22/2014 0358   CALCIUM  9.2 12/16/2023 0951   CALCIUM  8.0 (L) 08/22/2014 0358   GFRNONAA 49 (L) 12/16/2023 0951   GFRNONAA >60 08/22/2014 0358   GFRNONAA >60 04/19/2014 0428   GFRAA 52 (L) 04/12/2020 0804   GFRAA >60 08/22/2014 0358   GFRAA >60 04/19/2014 0428    BNP    Component Value Date/Time   BNP 455.1 (H) 12/04/2023 0929     Imaging:  DG Chest 1 View Result Date: 12/05/2023 CLINICAL DATA:  48 year old male with CHF, cardiomyopathy. EXAM: CHEST  1 VIEW COMPARISON:  Chest radiographs 12/03/2023 and earlier. FINDINGS: AP view at 0654 hours. Stable left chest AICD. Stable cardiomegaly and mediastinal contours. Visualized tracheal air column is within normal limits. Lung volumes are stable and within normal limits. No overt edema and allowing for portable technique the lungs are clear. No acute osseous abnormality identified. IMPRESSION: Stable cardiomegaly. No acute cardiopulmonary abnormality. Electronically Signed   By: Marlise Simpers M.D.   On: 12/05/2023 07:34   DG Chest 2 View Result Date: 12/03/2023 CLINICAL DATA:  Chest pain, dyspnea EXAM: CHEST - 2 VIEW COMPARISON:  None Available. FINDINGS: Lungs are well expanded, symmetric, and clear. No pneumothorax or pleural effusion. Stable cardiomegaly. Left subclavian dual lead pacemaker defibrillator is unchanged. Pulmonary vascularity is normal. Osseous structures are age-appropriate. No acute bone abnormality. IMPRESSION: 1. No active cardiopulmonary disease.  Stable cardiomegaly. Electronically Signed   By: Worthy Heads M.D.   On: 12/03/2023 21:59   ECHOCARDIOGRAM COMPLETE Result Date: 12/02/2023    ECHOCARDIOGRAM REPORT   Patient Name:   DUSTAN HYAMS Date of Exam: 12/01/2023  Medical Rec #:  [id number]        Height:       65.5 in Accession #:    [id number]       Weight:       308.6 lb Date of Birth:  1976-03-18       BSA:          2.392 m Patient Age:    47 years         BP:           133/108 mmHg Patient Gender: M                HR:           68 bpm. Exam Location:  [facility] Procedure: 2D Echo, Cardiac Doppler, Color Doppler and Intracardiac            Opacification Agent (Both Spectral and Color Flow Doppler were            utilized during procedure). Indications:     I50.21 Acute Systolic Heart Failure  History:         Patient has prior history of Echocardiogram examinations, most                  recent 10/20/2022. Defibrillator, COPD, Arrythmias:LBBB,                  Signs/Symptoms:Dyspnea; Risk Factors:Hypertension, Diabetes and                  Dyslipidemia. Nonischemic cardiomyopathy. Obstructive sleep                  apnea-CPAP.  Sonographer:     NaTashia Rodgers-Jones  RDCS Referring Phys:  [id number] Alayne Allis [place] Diagnosing Phys: Percival Brace MD IMPRESSIONS  1. Left ventricular ejection fraction, by estimation, is 30 to 35%. The left ventricle has moderately decreased function. The left ventricle demonstrates regional wall motion abnormalities (see scoring diagram/findings for description). The left ventricular internal cavity size was moderately dilated. Left ventricular diastolic parameters are indeterminate.  2. Right ventricular systolic function is normal. The right ventricular size is normal.  3. The mitral valve is normal in structure. Mild mitral valve regurgitation. No evidence of mitral stenosis.  4. The aortic valve is normal in structure. Aortic valve regurgitation is not visualized. No aortic stenosis is present.  5. The inferior vena cava is normal in size with greater than 50% respiratory variability, suggesting right atrial pressure of 3 mmHg. FINDINGS  Left Ventricle: Left ventricular ejection fraction, by estimation, is 30 to 35%. The left ventricle has  moderately decreased function. The left ventricle demonstrates regional wall motion abnormalities. Definity  contrast agent was given IV to delineate the left ventricular endocardial borders. Strain was performed and the global longitudinal strain is indeterminate. The left ventricular internal cavity size was moderately dilated. There is no left ventricular hypertrophy. Left ventricular diastolic parameters are indeterminate.  LV Wall Scoring: The apical lateral segment and apex are akinetic. The mid anterolateral segment and apical septal segment are hypokinetic. Right Ventricle: The right ventricular size is normal. No increase in right ventricular wall thickness. Right ventricular systolic function is normal. Left Atrium: Left atrial size was normal in size. Right Atrium: Right atrial size was normal in size. Pericardium: There is no evidence of pericardial effusion. Mitral Valve: The mitral valve is normal in structure. Mild mitral valve regurgitation. No evidence of mitral valve stenosis. Tricuspid Valve: The tricuspid valve is normal in structure. Tricuspid valve regurgitation is mild . No evidence of tricuspid stenosis. Aortic Valve: The aortic valve is normal in structure. Aortic valve regurgitation is not visualized. No aortic stenosis is present. Pulmonic Valve: The pulmonic valve was normal in structure. Pulmonic valve regurgitation is not visualized. No evidence of pulmonic stenosis. Aorta: The aortic root is normal in size and structure. Venous: The inferior vena cava is normal in size with greater than 50% respiratory variability, suggesting right atrial pressure of 3 mmHg. IAS/Shunts: No atrial level shunt detected by color flow Doppler. Additional Comments: 3D was performed not requiring image post processing on an independent workstation and was indeterminate.  LEFT VENTRICLE PLAX 2D LVIDd:         8.20 cm Diastology LVIDs:         6.80 cm LV e' medial:    5.38 cm/s LV PW:         0.90 cm LV E/e'  medial:  9.9 LV IVS:        0.80 cm LV e' lateral:   6.36 cm/s                        LV E/e' lateral: 8.4  RIGHT VENTRICLE             IVC RV Basal diam:  4.70 cm     IVC diam: 1.70 cm RV S prime:     12.60 cm/s TAPSE (M-mode): 2.7 cm LEFT ATRIUM              Index        RIGHT ATRIUM           Index LA diam:  6.40 cm  2.68 cm/m   RA Area:     15.60 cm LA Vol (A2C):   83.0 ml  34.70 ml/m  RA Volume:   45.30 ml  18.94 ml/m LA Vol (A4C):   113.0 ml 47.24 ml/m LA Biplane Vol: 99.5 ml  41.59 ml/m  AORTIC VALVE LVOT Vmax:   94.47 cm/s LVOT Vmean:  59.633 cm/s LVOT VTI:    0.159 m  AORTA Ao Root diam: 3.70 cm Ao Asc diam:  4.20 cm MITRAL VALVE MV Area (PHT): 4.18 cm    SHUNTS MV Decel Time: 182 msec    Systemic VTI: 0.16 m MV E velocity: 53.55 cm/s MV A velocity: 86.20 cm/s MV E/A ratio:  0.62 Percival Brace MD Electronically signed by Percival Brace MD Signature Date/Time: 12/02/2023/1:40:39 PM    Final    DG Chest 2 View Result Date: 12/01/2023 CLINICAL DATA:  Cough. EXAM: CHEST - 2 VIEW COMPARISON:  Chest radiograph dated 11/07/2023. FINDINGS: Stable cardiomegaly and central pulmonary vascular congestion. Stable left-sided pacemaker. No evidence of overt edema. No focal consolidation, pleural effusion, or pneumothorax. No acute osseous abnormality. IMPRESSION: Stable cardiomegaly with central pulmonary vascular congestion. Electronically Signed   By: Mannie Seek M.D.   On: 12/01/2023 09:51    Administration History     None           No data to display          No results found for: "NITRICOXIDE"      Assessment & Plan:     Chronic Obstructive Pulmonary Disease (COPD) Recent exacerbation requiring hospitalization, secondary to COVID infection. Clinically improving. Experiencing grogginess and fatigue post-hospitalization. Residual productive cough with purulent sputum. No acute process on CXR. Will treat him with empiric doxycycline  course for potential bacterial  coinfection and mucociliary clearance. Advised to continue utilizing Trelegy inhaler. Refilled SABA. Action plan in place.  - Refill albuterol  for rescue inhaler. - Continue Trelegy 1 puff daily. Follow with oral hygiene  - Monitor frequency of rescue inhaler use and report if usage increases. - Doxycycline  1 tab Twice daily for 7 days - Guaifenesin  OTC   COVID-19 Recent infection leading to hospitalization and COPD exacerbation. Recovering with residual fatigue and productive cough.  Chronic respiratory failure Using oxygen intermittently, primarily when wheezy, and consistently using CPAP with oxygen at night. No desaturations during in-office walk test.  - Continue monitoring oxygen levels with activity at home for goal >88-90% - Continue to utilize nightly  OSA/OHS Compliant with NIV. Missed days this last 30 day period due to illness and hospitalizations. Encourage to continue using nightly. Will place orders for new supplies to DME. Aware of risks of untreated OSA. Safe driving practices reviewed. - Continue to use NIV nightly - Maintain clean equipment and change supplies regularly   HFrEF No signs of fluid overload. Weight stable and no increased leg swelling. - Monitor weights at home - Follow up with cardiology as scheduled   Follow-up - 3 months or PRN       Advised if symptoms do not improve or worsen, to please contact office for sooner follow up or seek emergency care.   I spent 45 minutes of dedicated to the care of this patient on the date of this encounter to include pre-visit review of records, face-to-face time with the patient discussing conditions above, post visit ordering of testing, clinical documentation with the electronic health record, making appropriate referrals as documented, and communicating necessary findings to members of the patients  care team.  Roetta Clarke, NP 12/23/2023  Pt aware and understands NP's role.

## 2023-12-23 NOTE — Progress Notes (Signed)
Agree with the details of the visit as noted by Katherine Cobb, NP. ° °C. Laura Yasin Ducat, MD °Advanced Bronchoscopy °P[facility] ° °

## 2023-12-23 NOTE — Patient Instructions (Addendum)
 Continue albuterol  inhaler 2 puffs every 6 hours as needed for shortness of breath or wheezing. Notify if symptoms persist despite rescue inhaler/neb use.  Continue Trelegy 1 puff daily. Brush tongue and rinse mouth afterwards  Continue fluid pills as directed by your heart doctor Continue to use Trilogy vent at night with supplemental oxygen 2 lpm bled through Monitor oxygen levels during the day and if you are 88-90% or below, use your oxygen 2 lpm. Your walk test looked good today without any low oxygen levels on room air  Doxycycline  1 tab Twice daily for 7 days. Take with food. Guaifenesin  (mucinex ) [phone] mg Twice daily for cough/congestion   Follow up in 3 months with Dr. Viva Grise or Katie Artasia Thang,NP. If symptoms do not improve or worsen, please contact office for sooner follow up or seek emergency care.

## 2023-12-23 NOTE — Telephone Encounter (Signed)
 Pharmacy Patient Advocate Encounter   Received notification from CoverMyMeds that prior authorization for Albuterol  Sulfate HFA 108 (90 Base)MCG/ACT aerosol  is required/requested.   Insurance verification completed.   The patient is insured through [facility] .   Per test claim: Refill too soon. PA is not needed at this time. Medication was filled 04/23. Next eligible fill date is 05/02.

## 2023-12-29 ENCOUNTER — Ambulatory Visit (INDEPENDENT_AMBULATORY_CARE_PROVIDER_SITE_OTHER): Admitting: Podiatry

## 2023-12-29 DIAGNOSIS — M79675 Pain in left toe(s): Secondary | ICD-10-CM | POA: Diagnosis not present

## 2023-12-29 DIAGNOSIS — E119 Type 2 diabetes mellitus without complications: Secondary | ICD-10-CM

## 2023-12-29 DIAGNOSIS — Z794 Long term (current) use of insulin: Secondary | ICD-10-CM

## 2023-12-29 DIAGNOSIS — B351 Tinea unguium: Secondary | ICD-10-CM

## 2023-12-29 DIAGNOSIS — M79674 Pain in right toe(s): Secondary | ICD-10-CM | POA: Diagnosis not present

## 2023-12-29 NOTE — Progress Notes (Signed)
  Subjective:  Patient ID: Oscar [name], male    DOB: 12-08-1975,  MRN: [id number]  Chief Complaint  Patient presents with   Nail Problem    Nail trim    48 y.o. male presents with the above complaint. History confirmed with patient.  His nails are thickened and elongated and causing discomfort again, says his diabetes is well controlled  Denies tingling numbness or neuropathy symptoms  Objective:  Physical Exam: warm, good capillary refill, no trophic changes or ulcerative lesions, normal DP and PT pulses, and normal sensory exam. Left Foot: dystrophic yellowed discolored nail plates with subungual debris Right Foot: dystrophic yellowed discolored nail plates with subungual debris  Assessment:   No diagnosis found.      Plan:  Patient was evaluated and treated and all questions answered.  Discussed the etiology and treatment options for the condition in detail with the patient. Educated patient on the topical and oral treatment options for mycotic nails. Recommended debridement of the nails today. Sharp and mechanical debridement performed of all painful and mycotic nails today. Nails debrided in length and thickness using a nail nipper to level of comfort. Discussed treatment options including appropriate shoe gear. Follow up as needed for painful nails.    No follow-ups on file.

## 2024-01-12 NOTE — Progress Notes (Unsigned)
 Advanced Heart Failure Clinic Note    PCP: Alyn Babe, MD (last seen 01/25) Primary Cardiologist: Burney Carter, MD (last seen 04/25; returns 10/25) HF provider: Verneda Golder, MD (last seen 05/24)  Chief Complaint:  HPI:  Mr Saracco is a 48 y/o male with a history of obstructive sleep apnea (currently with CPAP), MI, HTN, VT (successful shock X 3), CKD, COPD, PAF, gout, hyperlipidemia, GERD, IDDM, SVT,asthma, chronic LBBB and chronic heart failure with CRT-D (2016).  Stress test 03/01/2019: Abnormal myocardial perfusion scan no clear evidence of reversible ischemia inferior persistent defect either scar versus body habitus artifact severely dilated left ventricular function severely depressed LV function with ejection fraction of around 11%. Recommend medical therapy   Echo 03/01/2019: EF of 15% along with mild MR.  Echo 02/27/20: EF of <20% Echo 11/23/20: EF of <20% along with moderate LVH and moderately elevated PA pressure. Echo 10/20/22: EF <20% along with mildly elevated PA pressure and mild MR.   Was in the ED 04/27/23 due to groin abscess X 1 month with drainage. CT done with f/u to be done with surgeon.   Admitted 06/12/23 due to ICD firing after sudden onset of SOB, chest tightness and dizziness. He was able to stand up and make it to the bathroom when he felt his ICD firing again. Afterwards, his shortness of breath, chest pain and dizziness resolved. He called EMS and on route to the ED, he was told that his ICD fired again. Successful conversion after the 3rd shock. Cardiology consulted. Needed amiodarone  drip with transition to or amiodarone .   Was in the ED 11/07/23 with right elbow pain. Has a history of gout. Was given colchicine  but has to wait 3 days before starting because he had been taking it daily for several weeks.  Admitted 12/01/23 with HF/ COPD exacerbation. IV diuresed. Cardiology consulted. IV solu-medrol  given with nebulizer, antibiotics. Given prednisone   taper at discharge. Troponin elevated due to demand ischemia. Echo 12/01/23: EF 30-35% with normal RV, mild MR  Was in the ED 12/03/23 with an episode of chest pain when getting in the shower. Also had lightheadedness and shortness of breath. Trponin 80 & then 130. EKG without acute ischemia. SOB improved and he was released.   Admitted 12/04/23 with worsening shortness of breath. Found to be + covid. Given IV lasix  for a/c heart failure. Given IV steroids with transition to oral prednisone  for COPD exacerbation. Had episode of chest pain, improved after giving bronchodilator. Troponin trending down not consistent with ACS.   Seen in [facility] 04/25 and had torsemide  decreased to 20mg  daily after getting BMET back.   He presents today for a HF follow-up visit with a chief complaint of     ROS: All systems negative except as listed in HPI, PMH and Problem List.  SH:  Social History   Socioeconomic History   Marital status: Single    Spouse name: Not on file   Number of children: 0   Years of education: Not on file   Highest education level: Some college, no degree  Occupational History   Occupation: unemployed  Tobacco Use   Smoking status: Former    Current packs/day: 0.00    Average packs/day: 1.5 packs/day for 34.0 years (51.0 ttl pk-yrs)    Types: Cigarettes    Quit date: 05/2023    Years since quitting: 0.6    Passive exposure: Past   Smokeless tobacco: Never   Tobacco comments:    Quit smoking 05/2023  Vaping  Use   Vaping status: Never Used  Substance and Sexual Activity   Alcohol use: No   Drug use: No   Sexual activity: Yes  Other Topics Concern   Not on file  Social History Narrative   Live alone   Social Drivers of Health   Financial Resource Strain: Medium Risk (12/02/2023)   Overall Financial Resource Strain (CARDIA)    Difficulty of Paying Living Expenses: Somewhat hard  Food Insecurity: No Food Insecurity (12/04/2023)   Hunger Vital Sign    Worried About Running Out  of Food in the Last Year: Never true    Ran Out of Food in the Last Year: Never true  Recent Concern: Food Insecurity - Food Insecurity Present (11/23/2023)   Received from [facility]   Hunger Vital Sign    Worried About Running Out of Food in the Last Year: Sometimes true    Ran Out of Food in the Last Year: Sometimes true  Transportation Needs: No Transportation Needs (12/04/2023)   PRAPARE - Administrator, Civil Service (Medical): No    Lack of Transportation (Non-Medical): No  Physical Activity: Not on file  Stress: Not on file  Social Connections: Moderately Isolated (12/04/2023)   Social Connection and Isolation Panel [NHANES]    Frequency of Communication with Friends and Family: More than three times a week    Frequency of Social Gatherings with Friends and Family: Once a week    Attends Religious Services: More than 4 times per year    Active Member of [employer] or Organizations: No    Attends Banker Meetings: Never    Marital Status: Never married  Intimate Partner Violence: Not At Risk (12/04/2023)   Humiliation, Afraid, Rape, and Kick questionnaire    Fear of Current or Ex-Partner: No    Emotionally Abused: No    Physically Abused: No    Sexually Abused: No    FH:  Family History  Problem Relation Age of Onset   Hypertension Mother    Congestive Heart Failure Mother    Hypertension Sister    Diabetes Sister    Pancreatitis Sister    COPD Sister    Emphysema Sister        smoked   Pancreatitis Brother    Anemia Neg Hx    Arrhythmia Neg Hx    Asthma Neg Hx    Clotting disorder Neg Hx    Fainting Neg Hx    Heart attack Neg Hx    Heart disease Neg Hx    Heart failure Neg Hx    Hyperlipidemia Neg Hx     Past Medical History:  Diagnosis Date   AICD (automatic cardioverter/defibrillator) present    Aortic atherosclerosis (HCC)    Asthma    Chronic respiratory failure (HCC)    CKD (chronic kidney disease), stage III (HCC)     COPD (chronic obstructive pulmonary disease) (HCC)    Deafness in right ear    Diabetes mellitus without complication (HCC)    Dyspnea    GERD (gastroesophageal reflux disease)    Gout    HFrEF (heart failure with reduced ejection fraction) (HCC)    a.) TTE 12/10/14: EF 25%; diff inf HK; sev LV and mod LA dil; LVH. b.) TTE 06/23/18: EF 20-25%; LVH; mild LV dil, mild BAE. c.) TTE 03/01/19: EF 15%, LVH, BAE; triv PR/TR. d.) TTE 02/27/20: EF < 20%; sev LV dil; mild MR. e.) TTE 11/23/20: EF < 20%; glob  HK; sev LV dil; LVH; mild-mod BAE; mod-sev TR, triv AR; G1DD. f.) TTE 05/14/21: EF <15%; LVH; sev LA and mild RV enlar; triv AR/PR, mild TR, mod MR.   Hiatal hernia    History of cardiac catheterization    a.) R/LHC 12/14/2009: normal coronaries. b.) R/LHC 12/11/2014: normal coronaries.   Hyperlipidemia    Hypertension    Hypoxemia    LBBB (left bundle branch block)    NICM (nonischemic cardiomyopathy; dilated cardiomyopathy) (HCC)    a.) R/LHC 12/14/2009: normal cors; LVEDP 18 mmHg, mean PA 29 mmHg, mean PCWP 31 mmHg; CO 6 L/min; CI 2.54 L/min/m. b.) TTE 12/10/2014: EF 25%. c.) R/LHC 12/11/2014: mean RA 9 mmHg, mean PA 22 mmHg, mean PCWP 22 mmHg. d.) TTE 06/23/2018: EF 20-25%. e.) TTE 03/01/2019: EF 15%. f.) TTE 02/27/2020: < 20%. g.) TTE 11/23/2020: EF <20%. h.) TTE 05/14/2021: < 15%.   NSTEMI (non-ST elevated myocardial infarction) (HCC)    a.) x 3 per patient report ---> 2012, 2014, 2016   Obesity    On supplemental oxygen by nasal cannula    a.) 2-3 L/[place] PRN   OSA on CPAP    PAF (paroxysmal atrial fibrillation) (HCC)    a.) CHA2DS2-VASc = 4 (HFrEF, HTN, prior MI, T2DM). b.) rate/rhythm maintained without pharmacologial intervention; no current anticoagulation.   Pancreatitis    PSVT (paroxysmal supraventricular tachycardia) (HCC)     Current Outpatient Medications  Medication Sig Dispense Refill   albuterol  (VENTOLIN  HFA) 108 (90 Base) MCG/ACT inhaler Inhale 2 puffs into the lungs every  6 (six) hours as needed for wheezing or shortness of breath. 8 g 2   allopurinol  (ZYLOPRIM ) 100 MG tablet Take 100 mg by mouth daily.     amiodarone  (PACERONE ) 200 MG tablet Take 1 tablet (200 mg total) by mouth 2 (two) times daily. (Patient taking differently: Take 200 mg by mouth daily.) 60 tablet 0   apixaban  (ELIQUIS ) 5 MG TABS tablet Take 1 tablet (5 mg total) by mouth 2 (two) times daily. 60 tablet 1   atorvastatin  (LIPITOR ) 40 MG tablet Take 40 mg by mouth daily.     BIMZELX 160 MG/ML pen Inject 2 mLs into the skin as directed. Every 2 weeks     carvedilol  (COREG ) 3.125 MG tablet Take 3.125 mg by mouth 2 (two) times daily with a meal.     colchicine  0.6 MG tablet Take 0.6 mg by mouth daily.     dapagliflozin  propanediol (FARXIGA ) 10 MG TABS tablet Take 1 tablet (10 mg total) by mouth daily. 30 tablet 2   Fluticasone -Umeclidin-Vilant (TRELEGY ELLIPTA ) 100-62.5-25 MCG/ACT AEPB Inhale 1 puff into the lungs daily. 60 each 11   insulin  glargine (LANTUS ) 100 UNIT/ML Solostar Pen Inject 15 Units into the skin at bedtime for 7 days. 1.05 mL 0   NON FORMULARY Pt uses a cpap nightly     pantoprazole  (PROTONIX ) 40 MG tablet Take 1 tablet (40 mg total) by mouth daily.     sacubitril -valsartan  (ENTRESTO ) 24-26 MG Take by mouth 2 (two) times daily. Taking 1/2 tablet     torsemide  (DEMADEX ) 20 MG tablet Take 1 tablet (20 mg total) by mouth daily.     No current facility-administered medications for this visit.      PHYSICAL EXAM:  General: Well appearing. No resp difficulty HEENT: normal Neck: supple, no JVD Cor: Regular rhythm, rate. No rubs, gallops or murmurs Lungs: clear Abdomen: soft, nontender, nondistended. Extremities: no cyanosis, clubbing, rash, 1+ pitting edema bilateral lower  legs to knees Neuro: alert & oriented X 3. Moves all 4 extremities w/o difficulty. Affect pleasant   ECG:    ASSESSMENT & PLAN:  1: Chronic heart failure with reduced ejection fraction- - NYHA class  II - euvolemic - weighing daily; Reminded to call for an overnight weight gain of >2 pounds or a weekly weight gain of >5 pounds.  - weight 309.4 from last visit here 1 month ago - Stress test 03/01/2019:   Abnormal myocardial perfusion scan no clear evidence of reversible ischemia inferior persistent defect either scar versus body habitus artifact severely dilated left ventricular function severely depressed LV function with ejection fraction of around 11%.   - Echo 03/01/2019: EF of 15% along with mild MR.  - Echo 02/27/20: EF of <20% - Echo 11/23/20: EF of <20% along with moderate LVH and moderately elevated PA pressure. - Echo 10/20/22: EF <20% along with mildly elevated PA pressure and mild MR.  - Echo 12/01/23: EF 30-35% with normal RV, mild MR - AICD (2016) in place but denies any shocks "in a long time" - continue entresto  1/2 tablet of 24/26mg  BID  - continue torsemide  20mg  daily - continue carvedilol  3.125mg  BID  - continue farxiga  10mg  daily  - in the past, spironolactone  has caused hypotension - saw HF provider Corie Diamond) 05/24 - BNP 12/04/23 was 455.1  2: HTN with CKD- - BP  - sees PCP (Mancheno) at [facility]  - BMP 12/16/23 reviewed: sodium 137, potassium 4.1, Cr 1.7 and GFR 49 - BMET today - saw nephrology Milta Alosa) 05/25  3: Obstructive sleep apnea- - wears nocturnal ventilator QHS, ~ 6-8 hours/night; reports sleeping well - continues wearing oxygen at 3L at bedtime & PRN during the day - saw pulmonology Cleo Dace) 04/25  4: Diabetes- - A1c 12/03/23 was 8.5% - continue atorvastatin  40mg  daily - LDL 09/30/22 was 168  5: Atrial fibrillation- - saw cardiology Beau Bound) 04/25 - currently taking amiodarone  200mg  daily although previously said he was taking it BID - does not want to resume apixaban ; aware of stroke risk  6: VT- - magnesium  06/13/23 was 2.0  - Mg 12/06/23 reviewed and was 2.3     Charlette Console, FNP 01/12/24

## 2024-01-13 ENCOUNTER — Ambulatory Visit: Attending: Family | Admitting: Family

## 2024-01-13 ENCOUNTER — Encounter: Payer: Self-pay | Admitting: Family

## 2024-01-13 VITALS — BP 108/73 | HR 78 | Wt 310.0 lb

## 2024-01-13 DIAGNOSIS — K219 Gastro-esophageal reflux disease without esophagitis: Secondary | ICD-10-CM | POA: Insufficient documentation

## 2024-01-13 DIAGNOSIS — Z95810 Presence of automatic (implantable) cardiac defibrillator: Secondary | ICD-10-CM | POA: Insufficient documentation

## 2024-01-13 DIAGNOSIS — E1122 Type 2 diabetes mellitus with diabetic chronic kidney disease: Secondary | ICD-10-CM | POA: Insufficient documentation

## 2024-01-13 DIAGNOSIS — Z833 Family history of diabetes mellitus: Secondary | ICD-10-CM | POA: Diagnosis not present

## 2024-01-13 DIAGNOSIS — I10 Essential (primary) hypertension: Secondary | ICD-10-CM

## 2024-01-13 DIAGNOSIS — M109 Gout, unspecified: Secondary | ICD-10-CM | POA: Insufficient documentation

## 2024-01-13 DIAGNOSIS — I4891 Unspecified atrial fibrillation: Secondary | ICD-10-CM | POA: Insufficient documentation

## 2024-01-13 DIAGNOSIS — N183 Chronic kidney disease, stage 3 unspecified: Secondary | ICD-10-CM | POA: Diagnosis not present

## 2024-01-13 DIAGNOSIS — J4489 Other specified chronic obstructive pulmonary disease: Secondary | ICD-10-CM | POA: Diagnosis not present

## 2024-01-13 DIAGNOSIS — Z79899 Other long term (current) drug therapy: Secondary | ICD-10-CM | POA: Insufficient documentation

## 2024-01-13 DIAGNOSIS — I480 Paroxysmal atrial fibrillation: Secondary | ICD-10-CM | POA: Diagnosis not present

## 2024-01-13 DIAGNOSIS — Z7984 Long term (current) use of oral hypoglycemic drugs: Secondary | ICD-10-CM | POA: Insufficient documentation

## 2024-01-13 DIAGNOSIS — I252 Old myocardial infarction: Secondary | ICD-10-CM | POA: Diagnosis not present

## 2024-01-13 DIAGNOSIS — Z8249 Family history of ischemic heart disease and other diseases of the circulatory system: Secondary | ICD-10-CM | POA: Diagnosis not present

## 2024-01-13 DIAGNOSIS — Z8616 Personal history of COVID-19: Secondary | ICD-10-CM | POA: Insufficient documentation

## 2024-01-13 DIAGNOSIS — Z5986 Financial insecurity: Secondary | ICD-10-CM | POA: Insufficient documentation

## 2024-01-13 DIAGNOSIS — I471 Supraventricular tachycardia, unspecified: Secondary | ICD-10-CM | POA: Insufficient documentation

## 2024-01-13 DIAGNOSIS — Z794 Long term (current) use of insulin: Secondary | ICD-10-CM | POA: Insufficient documentation

## 2024-01-13 DIAGNOSIS — E119 Type 2 diabetes mellitus without complications: Secondary | ICD-10-CM | POA: Diagnosis not present

## 2024-01-13 DIAGNOSIS — G4733 Obstructive sleep apnea (adult) (pediatric): Secondary | ICD-10-CM | POA: Insufficient documentation

## 2024-01-13 DIAGNOSIS — I447 Left bundle-branch block, unspecified: Secondary | ICD-10-CM | POA: Diagnosis not present

## 2024-01-13 DIAGNOSIS — I5022 Chronic systolic (congestive) heart failure: Secondary | ICD-10-CM | POA: Diagnosis not present

## 2024-01-13 DIAGNOSIS — I130 Hypertensive heart and chronic kidney disease with heart failure and stage 1 through stage 4 chronic kidney disease, or unspecified chronic kidney disease: Secondary | ICD-10-CM | POA: Insufficient documentation

## 2024-01-13 DIAGNOSIS — I472 Ventricular tachycardia, unspecified: Secondary | ICD-10-CM | POA: Insufficient documentation

## 2024-01-13 DIAGNOSIS — E785 Hyperlipidemia, unspecified: Secondary | ICD-10-CM | POA: Insufficient documentation

## 2024-01-13 MED ORDER — LOSARTAN POTASSIUM 25 MG PO TABS: 25.0000 mg | ORAL_TABLET | Freq: Every day | ORAL | 11 refills | Status: DC

## 2024-01-13 MED ORDER — METOPROLOL SUCCINATE ER 25 MG PO TB24: 25.0000 mg | ORAL_TABLET | Freq: Every day | ORAL | 11 refills | Status: DC

## 2024-01-13 NOTE — Patient Instructions (Addendum)
 Medication Changes:  STOP COREG  AND ENTRESTO   START METOPROLOL  SUCCINATE 12.5 MG ONCE DAILY  START LOSARTAN 25 MG ONCE DAILY   Lab Work:  Go DOWN to LOWER LEVEL (LL) to have your blood work completed inside of [employer] office.  We will only call you if the results are abnormal or if the provider would like to make medication changes.   Follow-Up in: 1 MONTH WITH Shawnee Dellen, FNP.  At the Advanced [facility] Failure Clinic, you and your health needs are our priority. We have a designated team specialized in the treatment of Heart Failure. This Care Team includes your primary Heart Failure Specialized Cardiologist (physician), Advanced Practice Providers (APPs- Physician Assistants and Nurse Practitioners), and Pharmacist who all work together to provide you with the care you need, when you need it.   You may see any of the following providers on your designated Care Team at your next follow up:  Dr. Jules Oar Dr. Peder Bourdon Dr. Alwin Baars Dr. Judyth Nunnery Shawnee Dellen, FNP Bevely Brush, RPH-CPP  Please be sure to bring in all your medications bottles to every appointment.   Need to Contact Us :  If you have any questions or concerns before your next appointment please send us  a message through [place] or call our office at [phone].    TO LEAVE A MESSAGE FOR THE NURSE SELECT OPTION 2, PLEASE LEAVE A MESSAGE INCLUDING: YOUR NAME DATE OF BIRTH CALL BACK NUMBER REASON FOR CALL**this is important as we prioritize the call backs  YOU WILL RECEIVE A CALL BACK THE SAME DAY AS LONG AS YOU CALL BEFORE 4:00 PM

## 2024-01-13 NOTE — Progress Notes (Signed)
 [facility] - HEART FAILURE CLINIC - PHARMACIST COUNSELING NOTE  Oscar [name] is a 48 y.o. year old male who presents to the HF clinic for a 3-4 week follow up appointment. They have a past medical history significant for obstructive sleep apnea (currently with CPAP), MI, HTN, VT (successful shock X 3), CKD, COPD, PAF, gout, hyperlipidemia, GERD, IDDM, SVT,asthma, chronic LBBB and chronic heart failure with CRT-D (2016). Initially, they were diagnosed with HFrEF on 01/2019 secondary to unclear etiology with an EF of 15%. At their last appointment on 12/16/2023, they had seen cardiology the day before who made changes to their medications. They changed their metoprolol  to Coreg  and added Farxiga .   Current Guideline-Directed Medical Therapy (GDMT)  ACE/ARB/ARNI: Sacubitril -valsartan  24-26 mg twice daily - 0.5 tablet  Beta Blocker: Carvedilol  3.125 mg twice daily - MD told them to take it once per day due to BP Aldosterone Antagonist: NA Diuretic: Torsemide  20 mg BID SGLT2i: Dapagliflozin  10 mg daily  Vital Signs and Trends  Recent Trends BP Readings from Last 3 Encounters:  12/23/23 108/68  12/16/23 118/79  12/08/23 (!) 112/57   Wt Readings from Last 3 Encounters:  12/23/23 (!) 306 lb 12.8 oz (139.2 kg)  12/16/23 (!) 309 lb 6.4 oz (140.3 kg)  12/04/23 (!) 330 lb 12.8 oz (150 kg)   Today's visit HR 78 BP 108/73 Weight (pounds) 310 lbs  Do you check your weight daily? [] Yes [x] No  Do you check you blood pressure daily [] Yes [x] No  PTA weights: 306 - 310, diet is probably the reason (eat too much salt) PTA BP: 80-84/60 - was the lowest they see   Cardiac Imaging  Echo 03/01/2019: EF of 15% along with mild MR.  Echo 02/27/20: EF of <20% Echo 11/23/20: EF of <20% along with moderate LVH and moderately elevated PA pressure. Echo 10/20/22: EF <20% along with mildly elevated PA pressure and mild MR.   Changes Since Last Visit   Hospitalizations/ED visits (last 6  months): -- Date - 12/04/23 - 12/08/23, [facility] - SOB, Admission Dx - COPDe, COVID -- Date - 12/01/23 - 12/03/23, [facility] - cough, Admission Dx - acute on chronic combined CHF, COPDe -- Date - 11/07/23, [facility] - chest pain  -- Date - 08/03/23, [facility] - cyst  -- Date - 06/22/23, [facility] - Dizziness, fatigue, abscess Medication changes: NA  Pertinent Labs  Lab Results  Component Value Date   NA 137 12/16/2023   CL 100 12/16/2023   K 4.1 12/16/2023   CO2 29 12/16/2023   BUN 51 (H) 12/16/2023   CREATININE 1.70 (H) 12/16/2023   GFRNONAA 49 (L) 12/16/2023   CALCIUM  9.2 12/16/2023   ALBUMIN  3.6 12/04/2023   GLUCOSE 208 (H) 12/16/2023   Lab Results  Component Value Date   LDLCALC 168 (H) 09/30/2022   Lab Results  Component Value Date   HGBA1C 8.5 (H) 12/03/2023    ASSESSMENT  Reason for today's visit   To assess current guideline directed medical therapy. This is a routine follow-up after medication changes by cardiology. Last week they reported taking double their torsemide  due to excess fluid retention after eating excess salt. They reported they did not take their medications today before clinic because it makes them feel too fatigued and sick. Patient reports timing their medications around when they have to be active and when they can rest. When asked why the patient was changed from metoprolol  to Coreg , they weren't sure why but would be ok if  we changed them back since Coreg  has more BP lowering effects.   Guideline-Directed Medical Therapy Evaluation ACEi/ARB/ARNi Target Achieved [] Yes [x] No Up-titration candidate today [] Yes [x] No  Beta-Blocker  Target Achieved [] Yes [x] No Up-titration candidate today [] Yes [x] No  MRA Patient is not on therapy because they previously experience hypotension when taking it previously  SGLT-2 Target Achieved [x] Yes [] No Up-titration candidate today [] Yes [x] No  Loop  Increase dose today  [x] Yes [] No  Heart Failure Symptoms & Volume Status   Dyspnea on  exertion, walking a distance or up steps [x] Yes [] No  Fatigue, happens 2-3 times per week is usual [x] Yes [] No  Lower extremity edema, up and down, just up and down with diet [x] Yes [] No  Weight gain (> 5 lbs), all over the place [x] Yes [] No  Cough or wheezing - goes away after using the fluid pill [x] Yes [] No  Abdominal bloating/Discomfort [] Yes [x] No  Early satiety or poor appetite [] Yes [x] No  Dizziness of lightheadedness, due to BP,  [x] Yes [] No   Adherence Assessment  Do you ever forget to take your medication? [] Yes [x] No  Do you ever skip doses due to side effects? [] Yes [x] No  Do you have trouble affording your medicines? [] Yes [x] No  Are you ever unable to pick up your medication due to transportation difficulties? [] Yes [x] No  Do you ever stop taking your medications because you don't believe they are helping? [] Yes [x] No   Adherence strategy: Use the HERO machine Barriers to obtaining medications: None identified  Did you take your medications before your appointment today: [] Yes [x] No  Preventative Care   Parameter Most Recent Result Goal/Status Current Medications  LDL 168 < 70 - 55 mg/dL Above goal Statin: Atorvastatin  40 mg daily   A1c 8.5  7 - 8 % Above goal Lantus    BP Control 108/73 < 130/80 A little low NA  ECHO 30-35% 6 - 12 months Last 12/02/2023   PLAN  Medication Interventions:  -- Consider stopping their Entresto  and adding a low dose ARB (losartan) since it still has heart benefits but is a less potent BP lowering agent -- Consider changing them back to metoprolol  from Coreg  since it impacts their BP less  -- Consider adding 20 mg torsemide  PRN and counseled patient on avoiding high salt foods -- Continue medication regimen per NP Preventative Care Follow-up:  -- Consider repeat LDL -- Patient likely candidate for additional statin lowering agents like Zetia or a PCSK9i Lab or imaging orders:  -- Annual ECHO up to date   Time spent:  15 minutes  Thank you for allowing pharmacy to participate in this patient's care.   Miesha Bachmann K Cesilia Shinn, Pharm.D. Pharmacy Resident 01/13/2024 7:20 AM

## 2024-01-14 ENCOUNTER — Ambulatory Visit: Admitting: Urology

## 2024-01-15 ENCOUNTER — Ambulatory Visit (INDEPENDENT_AMBULATORY_CARE_PROVIDER_SITE_OTHER): Admitting: Urology

## 2024-01-15 ENCOUNTER — Encounter: Payer: Self-pay | Admitting: Urology

## 2024-01-15 VITALS — BP 98/72 | HR 78 | Ht 65.0 in | Wt 310.0 lb

## 2024-01-15 DIAGNOSIS — L732 Hidradenitis suppurativa: Secondary | ICD-10-CM

## 2024-01-15 MED ORDER — DOXYCYCLINE HYCLATE 100 MG PO CAPS: 100.0000 mg | ORAL_CAPSULE | Freq: Two times a day (BID) | ORAL | 0 refills | Status: DC

## 2024-01-15 NOTE — Progress Notes (Signed)
 01/15/2024 7:48 PM   Oscar [name] 1975/11/16 [id number]  Referring provider: Kenton Pearl, MD [address] [place],  [place] [address]  Urological history: 1. None  Chief Complaint  Patient presents with   Testicle Pain    HPI: Oscar [name] is a 48 y.o. man who presents today for testicular cyst.    Previous records reviewed.   Patient was seen in January for hidradenitis follow-up.  He has been followed by [facility] dermatology and was last seen on 11/13/2023 and has an appointment with them on June 11th.    He has been dealing with draining cysts in his right groin area for the last year.   He states he has been treated with antibiotics and injections through his dermatologist.    He has not had any fevers, chills, nausea or vomiting.     PMH: Past Medical History:  Diagnosis Date   AICD (automatic cardioverter/defibrillator) present    Aortic atherosclerosis (HCC)    Asthma    Chronic respiratory failure (HCC)    CKD (chronic kidney disease), stage III (HCC)    COPD (chronic obstructive pulmonary disease) (HCC)    Deafness in right ear    Diabetes mellitus without complication (HCC)    Dyspnea    GERD (gastroesophageal reflux disease)    Gout    HFrEF (heart failure with reduced ejection fraction) (HCC)    a.) TTE 12/10/14: EF 25%; diff inf HK; sev LV and mod LA dil; LVH. b.) TTE 06/23/18: EF 20-25%; LVH; mild LV dil, mild BAE. c.) TTE 03/01/19: EF 15%, LVH, BAE; triv PR/TR. d.) TTE 02/27/20: EF < 20%; sev LV dil; mild MR. e.) TTE 11/23/20: EF < 20%; glob HK; sev LV dil; LVH; mild-mod BAE; mod-sev TR, triv AR; G1DD. f.) TTE 05/14/21: EF <15%; LVH; sev LA and mild RV enlar; triv AR/PR, mild TR, mod MR.   Hiatal hernia    History of cardiac catheterization    a.) R/LHC 12/14/2009: normal coronaries. b.) R/LHC 12/11/2014: normal coronaries.   Hyperlipidemia    Hypertension    Hypoxemia    LBBB (left bundle branch block)    NICM (nonischemic  cardiomyopathy; dilated cardiomyopathy) (HCC)    a.) R/LHC 12/14/2009: normal cors; LVEDP 18 mmHg, mean PA 29 mmHg, mean PCWP 31 mmHg; CO 6 L/min; CI 2.54 L/min/m. b.) TTE 12/10/2014: EF 25%. c.) R/LHC 12/11/2014: mean RA 9 mmHg, mean PA 22 mmHg, mean PCWP 22 mmHg. d.) TTE 06/23/2018: EF 20-25%. e.) TTE 03/01/2019: EF 15%. f.) TTE 02/27/2020: < 20%. g.) TTE 11/23/2020: EF <20%. h.) TTE 05/14/2021: < 15%.   NSTEMI (non-ST elevated myocardial infarction) (HCC)    a.) x 3 per patient report ---> 2012, 2014, 2016   Obesity    On supplemental oxygen by nasal cannula    a.) 2-3 L/[place] PRN   OSA on CPAP    PAF (paroxysmal atrial fibrillation) (HCC)    a.) CHA2DS2-VASc = 4 (HFrEF, HTN, prior MI, T2DM). b.) rate/rhythm maintained without pharmacologial intervention; no current anticoagulation.   Pancreatitis    PSVT (paroxysmal supraventricular tachycardia) [facility])     Surgical History: Past Surgical History:  Procedure Laterality Date   BIOPSY  11/25/2021   Procedure: BIOPSY;  Surgeon: Brice Campi Albino Alu., MD;  Location: [facility] ENDOSCOPY;  Service: Gastroenterology;;   CARDIAC CATHETERIZATION  12/11/2014   Procedure: RIGHT/LEFT HEART CATH AND CORONARY ANGIOGRAPHY;  Surgeon: Avanell Leigh, MD;  Location: [facility] CATH LAB;  Service: Cardiovascular;;  COLONOSCOPY WITH PROPOFOL  N/A 11/13/2021   Procedure: COLONOSCOPY WITH PROPOFOL ;  Surgeon: Selena Daily, MD;  Location: [facility] ENDOSCOPY;  Service: Gastroenterology;  Laterality: N/A;   ESOPHAGOGASTRODUODENOSCOPY (EGD) WITH PROPOFOL  N/A 11/25/2021   Procedure: ESOPHAGOGASTRODUODENOSCOPY (EGD) WITH PROPOFOL ;  Surgeon: Brice Campi Albino Alu., MD;  Location: [facility] ENDOSCOPY;  Service: Gastroenterology;  Laterality: N/A;   ICD LEAD REMOVAL N/A 03/30/2015   Procedure: ICD LEAD REMOVAL;  Surgeon: Jerrie Morales, MD;  Location: [facility] ORS;  Service: Cardiovascular;  Laterality: N/A;   IMPLANTABLE CARDIOVERTER DEFIBRILLATOR IMPLANT     INCISION AND DRAINAGE  ABSCESS N/A 12/12/2020   Procedure: INCISION AND DRAINAGE ABSCESS;  Surgeon: Alben Alma, MD;  Location: [facility] ORS;  Service: General;  Laterality: N/A;   INSERT / REPLACE / REMOVE PACEMAKER     LEFT HEART CATHETERIZATION WITH CORONARY ANGIOGRAM N/A 12/09/2014   Procedure: LEFT HEART CATHETERIZATION WITH CORONARY ANGIOGRAM;  Surgeon: Odie Benne, MD;  Location: [facility] CATH LAB;  Service: Cardiovascular;  Laterality: N/A;   RIGHT/LEFT HEART CATH AND CORONARY ANGIOGRAPHY Bilateral 12/14/2009   Procedure: RIGHT/LEFT HEART CATH AND CORONARY ANGIOGRAPHY; Location: [facility]; Surgeon: Thais Fill, MD   TONSILLECTOMY     as a child , adnoids removed   UPPER ESOPHAGEAL ENDOSCOPIC ULTRASOUND (EUS) N/A 11/25/2021   Procedure: UPPER ESOPHAGEAL ENDOSCOPIC ULTRASOUND (EUS);  Surgeon: Normie Becton., MD;  Location: [facility];  Service: Gastroenterology;  Laterality: N/A;    Home Medications:  Allergies as of 01/15/2024       Reactions   Ciprofloxacin  Swelling, Other (See Comments)   Migraine Headache   Iodinated Contrast Media    Other reaction(s): Vomiting Projectile vomiting   Isosorb Dinitrate-hydralazine  Other (See Comments)   Migraine Headache        Medication List        Accurate as of Jan 15, 2024  7:48 PM. If you have any questions, ask your nurse or doctor.          STOP taking these medications    apixaban  5 MG Tabs tablet Commonly known as: ELIQUIS  Stopped by: Saher Davee   Bimzelx 160 MG/ML pen Generic drug: bimekizumab-bkzx Stopped by: Elier Zellars   insulin  glargine 100 UNIT/ML Solostar Pen Commonly known as: LANTUS  Stopped by: Matilde Son       TAKE these medications    albuterol  108 (90 Base) MCG/ACT inhaler Commonly known as: VENTOLIN  HFA Inhale 2 puffs into the lungs every 6 (six) hours as needed for wheezing or shortness of breath.   allopurinol  100 MG tablet Commonly known as: ZYLOPRIM  Take 100 mg by mouth daily.    amiodarone  200 MG tablet Commonly known as: PACERONE  Take 1 tablet (200 mg total) by mouth 2 (two) times daily. What changed: when to take this   atorvastatin  40 MG tablet Commonly known as: LIPITOR  Take 40 mg by mouth daily.   colchicine  0.6 MG tablet Take 0.6 mg by mouth daily.   dapagliflozin  propanediol 10 MG Tabs tablet Commonly known as: FARXIGA  Take 1 tablet (10 mg total) by mouth daily.   doxycycline  100 MG capsule Commonly known as: VIBRAMYCIN  Take 1 capsule (100 mg total) by mouth every 12 (twelve) hours. Started by: Matilde Son   losartan 25 MG tablet Commonly known as: COZAAR Take 1 tablet (25 mg total) by mouth daily.   metoprolol  succinate 25 MG 24 hr tablet Commonly known as: TOPROL -XL Take 1 tablet (25 mg total) by mouth daily. Take with or immediately following a meal.  NON FORMULARY Pt uses a cpap nightly   pantoprazole  40 MG tablet Commonly known as: PROTONIX  Take 1 tablet (40 mg total) by mouth daily.   torsemide  20 MG tablet Commonly known as: DEMADEX  Take 1 tablet (20 mg total) by mouth daily.   Trelegy Ellipta  100-62.5-25 MCG/ACT Aepb Generic drug: Fluticasone -Umeclidin-Vilant Inhale 1 puff into the lungs daily.        Allergies:  Allergies  Allergen Reactions   Ciprofloxacin  Swelling and Other (See Comments)    Migraine Headache    Iodinated Contrast Media     Other reaction(s): Vomiting Projectile vomiting   Isosorb Dinitrate-Hydralazine  Other (See Comments)    Migraine Headache    Family History: Family History  Problem Relation Age of Onset   Hypertension Mother    Congestive Heart Failure Mother    Hypertension Sister    Diabetes Sister    Pancreatitis Sister    COPD Sister    Emphysema Sister        smoked   Pancreatitis Brother    Anemia Neg Hx    Arrhythmia Neg Hx    Asthma Neg Hx    Clotting disorder Neg Hx    Fainting Neg Hx    Heart attack Neg Hx    Heart disease Neg Hx    Heart failure Neg Hx     Hyperlipidemia Neg Hx     Social History:  reports that he quit smoking about 8 months ago. His smoking use included cigarettes. He has a 51 pack-year smoking history. He has been exposed to tobacco smoke. He has never used smokeless tobacco. He reports that he does not drink alcohol and does not use drugs.  ROS: Pertinent ROS in HPI  Physical Exam: BP 98/72   Pulse 78   Ht 5\' 5"  (1.651 m)   Wt (!) 310 lb (140.6 kg)   BMI 51.59 kg/m   Constitutional:  Well nourished. Alert and oriented, No acute distress. HEENT: [place] AT, moist mucus membranes.  Trachea midline Cardiovascular: No clubbing, cyanosis, or edema. Respiratory: Normal respiratory effort, no increased work of breathing. GU: No CVA tenderness.  No bladder fullness or masses.  Patient with buried phallus. Foreskin easily retracted  Urethral meatus is patent.  No penile discharge. No penile lesions or rashes. Right hemiscrotum with an indurated tract that extends into the right buttocks near the anus.  I could not detect any fluctuant masses, there is small purulent drainage from tracts when palpated, no crepitus is noted.  Testicles are located scrotally bilaterally. No masses are appreciated in the testicles.  Neurologic: Grossly intact, no focal deficits, moving all 4 extremities. Psychiatric: Normal mood and affect.  Laboratory Data: Lab Results  Component Value Date   WBC 6.2 12/08/2023   HGB 13.3 12/08/2023   HCT 38.5 (L) 12/08/2023   MCV 93.0 12/08/2023   PLT 156 12/08/2023    Lab Results  Component Value Date   CREATININE 1.70 (H) 12/16/2023    Lab Results  Component Value Date   HGBA1C 8.5 (H) 12/03/2023    Lab Results  Component Value Date   AST 48 (H) 12/04/2023   Lab Results  Component Value Date   ALT 50 (H) 12/04/2023  I have reviewed the labs.   Pertinent Imaging: N/A  Assessment & Plan:    1. Hidradenitis - explained that his condition is chronic and that I did not see a distinct area to  drain on today's exam - explained the referral process at [facility]  is to see dermatology first and once they have exhausted treatment options, the dermatology clinic with place the referral to Dr. Rose Conception for possible surgical treatment, we cannot refer directly to Dr. Figler - prescribed doxycycline  100 mg BID and will have him return in one week for repeat exam to see if there are any drainable collections  - reviewed return to clinic recautions   Return in about 1 week (around 01/22/2024) for repeat exam and possible I & D .  These notes generated with voice recognition software. I apologize for typographical errors.  Briant Camper  [facility] [address]  [address] [place], [place] [address] [phone]

## 2024-01-20 ENCOUNTER — Encounter: Attending: Internal Medicine | Admitting: *Deleted

## 2024-01-20 ENCOUNTER — Encounter: Payer: Self-pay | Admitting: *Deleted

## 2024-01-20 DIAGNOSIS — Z5189 Encounter for other specified aftercare: Secondary | ICD-10-CM | POA: Insufficient documentation

## 2024-01-20 DIAGNOSIS — I5022 Chronic systolic (congestive) heart failure: Secondary | ICD-10-CM

## 2024-01-20 NOTE — Progress Notes (Signed)
 Virtual orientation call completed today. he has an appointment on Date: 01/26/2024 for EP eval and gym Orientation.  Documentation of diagnosis can be found in [facility] 12/16/2023 .

## 2024-01-22 ENCOUNTER — Ambulatory Visit: Admitting: Urology

## 2024-01-26 ENCOUNTER — Encounter

## 2024-01-26 VITALS — Ht 69.1 in | Wt 308.3 lb

## 2024-01-26 DIAGNOSIS — I5022 Chronic systolic (congestive) heart failure: Secondary | ICD-10-CM | POA: Diagnosis present

## 2024-01-26 DIAGNOSIS — Z5189 Encounter for other specified aftercare: Secondary | ICD-10-CM | POA: Diagnosis not present

## 2024-01-26 NOTE — Patient Instructions (Signed)
 Patient Instructions  Patient Details  Name: Oscar [name] MRN: [id number] Date of Birth: 03-03-1976 Referring Provider:  Antonette Batters, MD  Below are your personal goals for exercise, nutrition, and risk factors. Our goal is to help you stay on track towards obtaining and maintaining these goals. We will be discussing your progress on these goals with you throughout the program.  Initial Exercise Prescription:  Initial Exercise Prescription - 01/26/24 1000       Date of Initial Exercise RX and Referring Provider   Date 01/26/24    Referring Provider Burney Carter, MD      Oxygen   Oxygen Continuous    Liters 2-3L    Maintain Oxygen Saturation 88% or higher      Treadmill   MPH 1.4    Grade 0    Minutes 15    METs 2.07      Recumbant Bike   Level 2    RPM 50    Watts 25    Minutes 15    METs 2.06      NuStep   Level 2    SPM 80    Minutes 15    METs 2.06      T5 Nustep   Level 2    SPM 80    Minutes 15    METs 2.06      Biostep-RELP   Level 2    SPM 50    Minutes 15    METs 2.06      Prescription Details   Frequency (times per week) 2    Duration Progress to 30 minutes of continuous aerobic without signs/symptoms of physical distress      Intensity   THRR 40-80% of Max Heartrate 104-149    Ratings of Perceived Exertion 11-13    Perceived Dyspnea 0-4      Progression   Progression Continue to progress workloads to maintain intensity without signs/symptoms of physical distress.      Resistance Training   Training Prescription Yes    Weight 3 lb, 5 lb   3 lb (R), 5 lb (L)   Reps 10-15             Exercise Goals: Frequency: Be able to perform aerobic exercise two to three times per week in program working toward 2-5 days per week of home exercise.  Intensity: Work with a perceived exertion of 11 (fairly light) - 15 (hard) while following your exercise prescription.  We will make changes to your prescription with you as you progress  through the program.   Duration: Be able to do 30 to 45 minutes of continuous aerobic exercise in addition to a 5 minute warm-up and a 5 minute cool-down routine.   Nutrition Goals: Your personal nutrition goals will be established when you do your nutrition analysis with the dietician.  The following are general nutrition guidelines to follow: Cholesterol < 200mg /day Sodium < 1500mg /day Fiber: Men under 50 yrs - 38 grams per day  Personal Goals:  Personal Goals and Risk Factors at Admission - 01/26/24 1038       Core Components/Risk Factors/Patient Goals on Admission    Weight Management Yes;Obesity;Weight Loss    Intervention Weight Management: Develop a combined nutrition and exercise program designed to reach desired caloric intake, while maintaining appropriate intake of nutrient and fiber, sodium and fats, and appropriate energy expenditure required for the weight goal.;Weight Management: Provide education and appropriate resources to help participant work on and attain  dietary goals.;Weight Management/Obesity: Establish reasonable short term and long term weight goals.;Obesity: Provide education and appropriate resources to help participant work on and attain dietary goals.    Admit Weight 308 lb 4.8 oz (139.8 kg)    Goal Weight: Short Term 274 lb (124.3 kg)    Goal Weight: Long Term 240 lb (108.9 kg)    Expected Outcomes Short Term: Continue to assess and modify interventions until short term weight is achieved;Long Term: Adherence to nutrition and physical activity/exercise program aimed toward attainment of established weight goal;Weight Loss: Understanding of general recommendations for a balanced deficit meal plan, which promotes 1-2 lb weight loss per week and includes a negative energy balance of [phone] kcal/d;Understanding recommendations for meals to include 15-35% energy as protein, 25-35% energy from fat, 35-60% energy from carbohydrates, less than 200mg  of dietary  cholesterol, 20-35 gm of total fiber daily;Understanding of distribution of calorie intake throughout the day with the consumption of 4-5 meals/snacks    Diabetes Yes    Intervention Provide education about signs/symptoms and action to take for hypo/hyperglycemia.;Provide education about proper nutrition, including hydration, and aerobic/resistive exercise prescription along with prescribed medications to achieve blood glucose in normal ranges: Fasting glucose 65-99 mg/dL    Expected Outcomes Short Term: Participant verbalizes understanding of the signs/symptoms and immediate care of hyper/hypoglycemia, proper foot care and importance of medication, aerobic/resistive exercise and nutrition plan for blood glucose control.;Long Term: Attainment of HbA1C < 7%.    Heart Failure Yes    Intervention Provide a combined exercise and nutrition program that is supplemented with education, support and counseling about heart failure. Directed toward relieving symptoms such as shortness of breath, decreased exercise tolerance, and extremity edema.    Expected Outcomes Improve functional capacity of life;Short term: Attendance in program 2-3 days a week with increased exercise capacity. Reported lower sodium intake. Reported increased fruit and vegetable intake. Reports medication compliance.;Short term: Daily weights obtained and reported for increase. Utilizing diuretic protocols set by physician.;Long term: Adoption of self-care skills and reduction of barriers for early signs and symptoms recognition and intervention leading to self-care maintenance.    Hypertension Yes    Intervention Provide education on lifestyle modifcations including regular physical activity/exercise, weight management, moderate sodium restriction and increased consumption of fresh fruit, vegetables, and low fat dairy, alcohol moderation, and smoking cessation.;Monitor prescription use compliance.    Expected Outcomes Short Term: Continued  assessment and intervention until BP is < 140/19mm HG in hypertensive participants. < 130/53mm HG in hypertensive participants with diabetes, heart failure or chronic kidney disease.;Long Term: Maintenance of blood pressure at goal levels.    Lipids Yes    Intervention Provide education and support for participant on nutrition & aerobic/resistive exercise along with prescribed medications to achieve LDL 70mg , HDL >40mg .    Expected Outcomes Short Term: Participant states understanding of desired cholesterol values and is compliant with medications prescribed. Participant is following exercise prescription and nutrition guidelines.;Long Term: Cholesterol controlled with medications as prescribed, with individualized exercise RX and with personalized nutrition plan. Value goals: LDL < 70mg , HDL > 40 mg.             Tobacco Use Initial Evaluation: Social History   Tobacco Use  Smoking Status Former   Current packs/day: 0.00   Average packs/day: 1.5 packs/day for 34.0 years (51.0 ttl pk-yrs)   Types: Cigarettes   Quit date: 05/2023   Years since quitting: 0.7   Passive exposure: Past  Smokeless Tobacco Never  Tobacco Comments  Quit smoking 05/2023    Exercise Goals and Review:  Exercise Goals     Row Name 01/26/24 1036             Exercise Goals   Increase Physical Activity Yes       Intervention Provide advice, education, support and counseling about physical activity/exercise needs.;Develop an individualized exercise prescription for aerobic and resistive training based on initial evaluation findings, risk stratification, comorbidities and participant's personal goals.       Expected Outcomes Short Term: Attend rehab on a regular basis to increase amount of physical activity.;Long Term: Add in home exercise to make exercise part of routine and to increase amount of physical activity.;Long Term: Exercising regularly at least 3-5 days a week.       Increase Strength and Stamina  Yes       Intervention Provide advice, education, support and counseling about physical activity/exercise needs.;Develop an individualized exercise prescription for aerobic and resistive training based on initial evaluation findings, risk stratification, comorbidities and participant's personal goals.       Expected Outcomes Short Term: Increase workloads from initial exercise prescription for resistance, speed, and METs.;Short Term: Perform resistance training exercises routinely during rehab and add in resistance training at home;Long Term: Improve cardiorespiratory fitness, muscular endurance and strength as measured by increased METs and functional capacity ( )       Able to understand and use rate of perceived exertion (RPE) scale Yes       Intervention Provide education and explanation on how to use RPE scale       Expected Outcomes Short Term: Able to use RPE daily in rehab to express subjective intensity level;Long Term:  Able to use RPE to guide intensity level when exercising independently       Able to understand and use Dyspnea scale Yes       Intervention Provide education and explanation on how to use Dyspnea scale       Expected Outcomes Short Term: Able to use Dyspnea scale daily in rehab to express subjective sense of shortness of breath during exertion;Long Term: Able to use Dyspnea scale to guide intensity level when exercising independently       Knowledge and understanding of Target Heart Rate Range (THRR) Yes       Intervention Provide education and explanation of THRR including how the numbers were predicted and where they are located for reference       Expected Outcomes Short Term: Able to state/look up THRR;Short Term: Able to use daily as guideline for intensity in rehab;Long Term: Able to use THRR to govern intensity when exercising independently       Able to check pulse independently Yes       Intervention Provide education and demonstration on how to check pulse in  carotid and radial arteries.;Review the importance of being able to check your own pulse for safety during independent exercise       Expected Outcomes Short Term: Able to explain why pulse checking is important during independent exercise;Long Term: Able to check pulse independently and accurately       Understanding of Exercise Prescription Yes       Intervention Provide education, explanation, and written materials on patient's individual exercise prescription       Expected Outcomes Short Term: Able to explain program exercise prescription;Long Term: Able to explain home exercise prescription to exercise independently

## 2024-01-26 NOTE — Progress Notes (Signed)
 Cardiac Individual Treatment Plan  Patient Details  Name: Oscar [name] MRN: [id number] Date of Birth: May 28, 1976 Referring Provider:   Flowsheet Row Cardiac Rehab from 01/26/2024 in [facility] Cardiac and Pulmonary Rehab  Referring Provider Burney Carter, MD       Initial Encounter Date:  Flowsheet Row Cardiac Rehab from 01/26/2024 in [facility] Cardiac and Pulmonary Rehab  Date 01/26/24       Visit Diagnosis: Chronic systolic heart failure (HCC)  Patient's Home Medications on Admission:  Current Outpatient Medications:    albuterol  (VENTOLIN  HFA) 108 (90 Base) MCG/ACT inhaler, Inhale 2 puffs into the lungs every 6 (six) hours as needed for wheezing or shortness of breath., Disp: 8 g, Rfl: 2   allopurinol  (ZYLOPRIM ) 100 MG tablet, Take 100 mg by mouth daily., Disp: , Rfl:    amiodarone  (PACERONE ) 200 MG tablet, Take 1 tablet (200 mg total) by mouth 2 (two) times daily. (Patient taking differently: Take 200 mg by mouth daily.), Disp: 60 tablet, Rfl: 0   amLODipine  (NORVASC ) 5 MG tablet, Take 5 mg by mouth daily., Disp: , Rfl:    atorvastatin  (LIPITOR ) 40 MG tablet, Take 40 mg by mouth daily., Disp: , Rfl:    colchicine  0.6 MG tablet, Take 0.6 mg by mouth daily., Disp: , Rfl:    dapagliflozin  propanediol (FARXIGA ) 10 MG TABS tablet, Take 1 tablet (10 mg total) by mouth daily., Disp: 30 tablet, Rfl: 2   doxycycline  (VIBRAMYCIN ) 100 MG capsule, Take 1 capsule (100 mg total) by mouth every 12 (twelve) hours., Disp: 60 capsule, Rfl: 0   Fluticasone -Umeclidin-Vilant (TRELEGY ELLIPTA ) 100-62.5-25 MCG/ACT AEPB, Inhale 1 puff into the lungs daily., Disp: 60 each, Rfl: 11   losartan  (COZAAR ) 25 MG tablet, Take 1 tablet (25 mg total) by mouth daily., Disp: 30 tablet, Rfl: 11   metoprolol  succinate (TOPROL -XL) 25 MG 24 hr tablet, Take 1 tablet (25 mg total) by mouth daily. Take with or immediately following a meal., Disp: 30 tablet, Rfl: 11   NON FORMULARY, Pt uses a cpap nightly, Disp: , Rfl:     pantoprazole  (PROTONIX ) 40 MG tablet, Take 1 tablet (40 mg total) by mouth daily., Disp: , Rfl:    torsemide  (DEMADEX ) 20 MG tablet, Take 1 tablet (20 mg total) by mouth daily., Disp: , Rfl:   Past Medical History: Past Medical History:  Diagnosis Date   AICD (automatic cardioverter/defibrillator) present    Aortic atherosclerosis (HCC)    Asthma    Chronic respiratory failure (HCC)    CKD (chronic kidney disease), stage III (HCC)    COPD (chronic obstructive pulmonary disease) (HCC)    Deafness in right ear    Diabetes mellitus without complication (HCC)    Dyspnea    GERD (gastroesophageal reflux disease)    Gout    HFrEF (heart failure with reduced ejection fraction) (HCC)    a.) TTE 12/10/14: EF 25%; diff inf HK; sev LV and mod LA dil; LVH. b.) TTE 06/23/18: EF 20-25%; LVH; mild LV dil, mild BAE. c.) TTE 03/01/19: EF 15%, LVH, BAE; triv PR/TR. d.) TTE 02/27/20: EF < 20%; sev LV dil; mild MR. e.) TTE 11/23/20: EF < 20%; glob HK; sev LV dil; LVH; mild-mod BAE; mod-sev TR, triv AR; G1DD. f.) TTE 05/14/21: EF <15%; LVH; sev LA and mild RV enlar; triv AR/PR, mild TR, mod MR.   Hiatal hernia    History of cardiac catheterization    a.) R/LHC 12/14/2009: normal coronaries. b.) R/LHC 12/11/2014: normal coronaries.  Hyperlipidemia    Hypertension    Hypoxemia    LBBB (left bundle branch block)    NICM (nonischemic cardiomyopathy; dilated cardiomyopathy) (HCC)    a.) R/LHC 12/14/2009: normal cors; LVEDP 18 mmHg, mean PA 29 mmHg, mean PCWP 31 mmHg; CO 6 L/min; CI 2.54 L/min/m. b.) TTE 12/10/2014: EF 25%. c.) R/LHC 12/11/2014: mean RA 9 mmHg, mean PA 22 mmHg, mean PCWP 22 mmHg. d.) TTE 06/23/2018: EF 20-25%. e.) TTE 03/01/2019: EF 15%. f.) TTE 02/27/2020: < 20%. g.) TTE 11/23/2020: EF <20%. h.) TTE 05/14/2021: < 15%.   NSTEMI (non-ST elevated myocardial infarction) (HCC)    a.) x 3 per patient report ---> 2012, 2014, 2016   Obesity    On supplemental oxygen by nasal cannula    a.) 2-3 L/[place] PRN    OSA on CPAP    PAF (paroxysmal atrial fibrillation) (HCC)    a.) CHA2DS2-VASc = 4 (HFrEF, HTN, prior MI, T2DM). b.) rate/rhythm maintained without pharmacologial intervention; no current anticoagulation.   Pancreatitis    PSVT (paroxysmal supraventricular tachycardia) (HCC)     Tobacco Use: Social History   Tobacco Use  Smoking Status Former   Current packs/day: 0.00   Average packs/day: 1.5 packs/day for 34.0 years (51.0 ttl pk-yrs)   Types: Cigarettes   Quit date: 05/2023   Years since quitting: 0.7   Passive exposure: Past  Smokeless Tobacco Never  Tobacco Comments   Quit smoking 05/2023    Labs: Review Flowsheet  More data exists      Latest Ref Rng & Units 12/25/2021 09/30/2022 06/13/2023 12/03/2023 12/04/2023  Labs for ITP Cardiac and Pulmonary Rehab  Cholestrol 0 - 200 mg/dL - 098  - - -  LDL (calc) 0 - 99 mg/dL - 119  - - -  HDL-C >14 mg/dL - 35  - - -  Trlycerides <150 mg/dL - 98  - - -  Hemoglobin A1c 4.8 - 5.6 % 6.8  - 7.3  8.5  -  Bicarbonate 20.0 - 28.0 mmol/L - - - - 29.2   O2 Saturation % - - - - 89.3      Exercise Target Goals: Exercise Program Goal: Individual exercise prescription set using results from initial 6 min walk test and THRR while considering  patient's activity barriers and safety.   Exercise Prescription Goal: Initial exercise prescription builds to 30-45 minutes a day of aerobic activity, 2-3 days per week.  Home exercise guidelines will be given to patient during program as part of exercise prescription that the participant will acknowledge.   Education: Aerobic Exercise: - Group verbal and visual presentation on the components of exercise prescription. Introduces F.I.T.T principle from ACSM for exercise prescriptions.  Reviews F.I.T.T. principles of aerobic exercise including progression. Written material given at graduation. Flowsheet Row Cardiac Rehab from 01/26/2024 in [facility] Cardiac and Pulmonary Rehab  Education need identified  01/26/24       Education: Resistance Exercise: - Group verbal and visual presentation on the components of exercise prescription. Introduces F.I.T.T principle from ACSM for exercise prescriptions  Reviews F.I.T.T. principles of resistance exercise including progression. Written material given at graduation. Flowsheet Row Cardiac Rehab from 01/26/2024 in [facility] Cardiac and Pulmonary Rehab  Education need identified 01/26/24        Education: Exercise & Equipment Safety: - Individual verbal instruction and demonstration of equipment use and safety with use of the equipment. Flowsheet Row Cardiac Rehab from 01/26/2024 in [facility] Cardiac and Pulmonary Rehab  Date 01/26/24  Educator MB  Instruction Review Code 1- Verbalizes Understanding       Education: Exercise Physiology & General Exercise Guidelines: - Group verbal and written instruction with models to review the exercise physiology of the cardiovascular system and associated critical values. Provides general exercise guidelines with specific guidelines to those with heart or lung disease.  Flowsheet Row Cardiac Rehab from 01/26/2024 in [facility] Cardiac and Pulmonary Rehab  Education need identified 01/26/24       Education: Flexibility, Balance, Mind/Body Relaxation: - Group verbal and visual presentation with interactive activity on the components of exercise prescription. Introduces F.I.T.T principle from ACSM for exercise prescriptions. Reviews F.I.T.T. principles of flexibility and balance exercise training including progression. Also discusses the mind body connection.  Reviews various relaxation techniques to help reduce and manage stress (i.e. Deep breathing, progressive muscle relaxation, and visualization). Balance handout provided to take home. Written material given at graduation.   Activity Barriers & Risk Stratification:  Activity Barriers & Cardiac Risk Stratification - 01/26/24 1028       Activity Barriers & Cardiac Risk  Stratification   Activity Barriers Joint Problems;Other (comment)    Comments Elbow joint, gout in both feet    Cardiac Risk Stratification High             6 Minute Walk:  6 Minute Walk     Row Name 01/26/24 1027         6 Minute Walk   Phase Initial     Distance 800 feet     Walk Time 6 minutes     # of Rest Breaks 0     MPH 1.52     METS 2.06     RPE 11     Perceived Dyspnea  1     VO2 Peak 7.21     Symptoms No     Resting HR 60 bpm     Resting BP 104/70     Resting Oxygen Saturation  98 %     Exercise Oxygen Saturation  during 6 min walk 97 %     Max Ex. HR 100 bpm     Max Ex. BP 118/80     2 Minute Post BP 100/70              Oxygen Initial Assessment:  Oxygen Initial Assessment - 01/20/24 1430       Home Oxygen   Home Oxygen Device Home Concentrator;E-Tanks    Sleep Oxygen Prescription Continuous;CPAP    Liters per minute 3    Home Exercise Oxygen Prescription Continuous    Liters per minute 3   when winded, 2 l when not winded   Home Resting Oxygen Prescription Continuous    Liters per minute 2    Compliance with Home Oxygen Use Yes      Intervention   Short Term Goals To learn and exhibit compliance with exercise, home and travel O2 prescription;To learn and understand importance of monitoring SPO2 with pulse oximeter and demonstrate accurate use of the pulse oximeter.;To learn and understand importance of maintaining oxygen saturations>88%;To learn and demonstrate proper pursed lip breathing techniques or other breathing techniques. ;To learn and demonstrate proper use of respiratory medications    Long  Term Goals Exhibits compliance with exercise, home  and travel O2 prescription;Verbalizes importance of monitoring SPO2 with pulse oximeter and return demonstration;Maintenance of O2 saturations>88%;Exhibits proper breathing techniques, such as pursed lip breathing or other method taught during program session;Compliance with respiratory  medication;Demonstrates proper use of MDI's  Oxygen Re-Evaluation:   Oxygen Discharge (Final Oxygen Re-Evaluation):   Initial Exercise Prescription:  Initial Exercise Prescription - 01/26/24 1000       Date of Initial Exercise RX and Referring Provider   Date 01/26/24    Referring Provider Burney Carter, MD      Oxygen   Oxygen Continuous    Liters 2-3L    Maintain Oxygen Saturation 88% or higher      Treadmill   MPH 1.4    Grade 0    Minutes 15    METs 2.07      Recumbant Bike   Level 2    RPM 50    Watts 25    Minutes 15    METs 2.06      NuStep   Level 2    SPM 80    Minutes 15    METs 2.06      T5 Nustep   Level 2    SPM 80    Minutes 15    METs 2.06      Biostep-RELP   Level 2    SPM 50    Minutes 15    METs 2.06      Prescription Details   Frequency (times per week) 2    Duration Progress to 30 minutes of continuous aerobic without signs/symptoms of physical distress      Intensity   THRR 40-80% of Max Heartrate 104-149    Ratings of Perceived Exertion 11-13    Perceived Dyspnea 0-4      Progression   Progression Continue to progress workloads to maintain intensity without signs/symptoms of physical distress.      Resistance Training   Training Prescription Yes    Weight 3 lb, 5 lb   3 lb (R), 5 lb (L)   Reps 10-15             Perform Capillary Blood Glucose checks as needed.  Exercise Prescription Changes:   Exercise Prescription Changes     Row Name 01/26/24 1000             Response to Exercise   Blood Pressure (Admit) 104/70       Blood Pressure (Exercise) 118/80       Blood Pressure (Exit) 100/70       Heart Rate (Admit) 60 bpm       Heart Rate (Exercise) 100 bpm       Heart Rate (Exit) 60 bpm       Oxygen Saturation (Admit) 98 %       Oxygen Saturation (Exercise) 97 %       Oxygen Saturation (Exit) 97 %       Rating of Perceived Exertion (Exercise) 11       Perceived Dyspnea (Exercise) 1        Symptoms none       Comments results       Intensity THRR New         Progression   Average METs 2.06                Exercise Comments:   Exercise Goals and Review:   Exercise Goals     Row Name 01/26/24 1036             Exercise Goals   Increase Physical Activity Yes       Intervention Provide advice, education, support and counseling about physical activity/exercise needs.;Develop an individualized exercise prescription for aerobic and resistive  training based on initial evaluation findings, risk stratification, comorbidities and participant's personal goals.       Expected Outcomes Short Term: Attend rehab on a regular basis to increase amount of physical activity.;Long Term: Add in home exercise to make exercise part of routine and to increase amount of physical activity.;Long Term: Exercising regularly at least 3-5 days a week.       Increase Strength and Stamina Yes       Intervention Provide advice, education, support and counseling about physical activity/exercise needs.;Develop an individualized exercise prescription for aerobic and resistive training based on initial evaluation findings, risk stratification, comorbidities and participant's personal goals.       Expected Outcomes Short Term: Increase workloads from initial exercise prescription for resistance, speed, and METs.;Short Term: Perform resistance training exercises routinely during rehab and add in resistance training at home;Long Term: Improve cardiorespiratory fitness, muscular endurance and strength as measured by increased METs and functional capacity ( )       Able to understand and use rate of perceived exertion (RPE) scale Yes       Intervention Provide education and explanation on how to use RPE scale       Expected Outcomes Short Term: Able to use RPE daily in rehab to express subjective intensity level;Long Term:  Able to use RPE to guide intensity level when exercising independently        Able to understand and use Dyspnea scale Yes       Intervention Provide education and explanation on how to use Dyspnea scale       Expected Outcomes Short Term: Able to use Dyspnea scale daily in rehab to express subjective sense of shortness of breath during exertion;Long Term: Able to use Dyspnea scale to guide intensity level when exercising independently       Knowledge and understanding of Target Heart Rate Range (THRR) Yes       Intervention Provide education and explanation of THRR including how the numbers were predicted and where they are located for reference       Expected Outcomes Short Term: Able to state/look up THRR;Short Term: Able to use daily as guideline for intensity in rehab;Long Term: Able to use THRR to govern intensity when exercising independently       Able to check pulse independently Yes       Intervention Provide education and demonstration on how to check pulse in carotid and radial arteries.;Review the importance of being able to check your own pulse for safety during independent exercise       Expected Outcomes Short Term: Able to explain why pulse checking is important during independent exercise;Long Term: Able to check pulse independently and accurately       Understanding of Exercise Prescription Yes       Intervention Provide education, explanation, and written materials on patient's individual exercise prescription       Expected Outcomes Short Term: Able to explain program exercise prescription;Long Term: Able to explain home exercise prescription to exercise independently                Exercise Goals Re-Evaluation :   Discharge Exercise Prescription (Final Exercise Prescription Changes):  Exercise Prescription Changes - 01/26/24 1000       Response to Exercise   Blood Pressure (Admit) 104/70    Blood Pressure (Exercise) 118/80    Blood Pressure (Exit) 100/70    Heart Rate (Admit) 60 bpm    Heart Rate (Exercise) 100 bpm  Heart Rate (Exit)  60 bpm    Oxygen Saturation (Admit) 98 %    Oxygen Saturation (Exercise) 97 %    Oxygen Saturation (Exit) 97 %    Rating of Perceived Exertion (Exercise) 11    Perceived Dyspnea (Exercise) 1    Symptoms none    Comments results    Intensity THRR New      Progression   Average METs 2.06             Nutrition:  Target Goals: Understanding of nutrition guidelines, daily intake of sodium 1500mg , cholesterol 200mg , calories 30% from fat and 7% or less from saturated fats, daily to have 5 or more servings of fruits and vegetables.  Education: All About Nutrition: -Group instruction provided by verbal, written material, interactive activities, discussions, models, and posters to present general guidelines for heart healthy nutrition including fat, fiber, MyPlate, the role of sodium in heart healthy nutrition, utilization of the nutrition label, and utilization of this knowledge for meal planning. Follow up email sent as well. Written material given at graduation. Flowsheet Row Cardiac Rehab from 01/26/2024 in [facility] Cardiac and Pulmonary Rehab  Education need identified 01/26/24       Biometrics:  Pre Biometrics - 01/26/24 1036       Pre Biometrics   Height 5' 9.1" (1.755 m)    Weight 308 lb 4.8 oz (139.8 kg)    Waist Circumference 58 inches    Hip Circumference 55 inches    Waist to Hip Ratio 1.05 %    BMI (Calculated) 45.4    Single Leg Stand 7.9 seconds              Nutrition Therapy Plan and Nutrition Goals:  Nutrition Therapy & Goals - 01/26/24 1037       Personal Nutrition Goals   Nutrition Goal Will meet with RD on 6/2      Intervention Plan   Intervention Prescribe, educate and counsel regarding individualized specific dietary modifications aiming towards targeted core components such as weight, hypertension, lipid management, diabetes, heart failure and other comorbidities.    Expected Outcomes Short Term Goal: Understand basic principles of dietary  content, such as calories, fat, sodium, cholesterol and nutrients.             Nutrition Assessments:  MEDIFICTS Score Key: >=70 Need to make dietary changes  40-70 Heart Healthy Diet <= 40 Therapeutic Level Cholesterol Diet   Picture Your Plate Scores: <56 Unhealthy dietary pattern with much room for improvement. 41-50 Dietary pattern unlikely to meet recommendations for good health and room for improvement. 51-60 More healthful dietary pattern, with some room for improvement.  >60 Healthy dietary pattern, although there may be some specific behaviors that could be improved.    Nutrition Goals Re-Evaluation:   Nutrition Goals Discharge (Final Nutrition Goals Re-Evaluation):   Psychosocial: Target Goals: Acknowledge presence or absence of significant depression and/or stress, maximize coping skills, provide positive support system. Participant is able to verbalize types and ability to use techniques and skills needed for reducing stress and depression.   Education: Stress, Anxiety, and Depression - Group verbal and visual presentation to define topics covered.  Reviews how body is impacted by stress, anxiety, and depression.  Also discusses healthy ways to reduce stress and to treat/manage anxiety and depression.  Written material given at graduation.   Education: Sleep Hygiene -Provides group verbal and written instruction about how sleep can affect your health.  Define sleep hygiene, discuss sleep cycles  and impact of sleep habits. Review good sleep hygiene tips.    Initial Review & Psychosocial Screening:  Initial Psych Review & Screening - 01/20/24 1432       Initial Review   Current issues with None Identified      Family Dynamics   Good Support System? Yes   siblings,     Barriers   Psychosocial barriers to participate in program There are no identifiable barriers or psychosocial needs.      Screening Interventions   Interventions Encouraged to exercise;To  provide support and resources with identified psychosocial needs;Provide feedback about the scores to participant    Expected Outcomes Short Term goal: Utilizing psychosocial counselor, staff and physician to assist with identification of specific Stressors or current issues interfering with healing process. Setting desired goal for each stressor or current issue identified.;Long Term Goal: Stressors or current issues are controlled or eliminated.;Short Term goal: Identification and review with participant of any Quality of Life or Depression concerns found by scoring the questionnaire.;Long Term goal: The participant improves quality of Life and PHQ9 Scores as seen by post scores and/or verbalization of changes             Quality of Life Scores:   Scores of 19 and below usually indicate a poorer quality of life in these areas.  A difference of  2-3 points is a clinically meaningful difference.  A difference of 2-3 points in the total score of the Quality of Life Index has been associated with significant improvement in overall quality of life, self-image, physical symptoms, and general health in studies assessing change in quality of life.  PHQ-9: Review Flowsheet  More data exists      01/26/2024 10/17/2021 06/15/2018 05/18/2018 03/05/2018  Depression screen PHQ 2/9  Decreased Interest 1 0 0 0 0  Down, Depressed, Hopeless 0 0 0 0 1  PHQ - 2 Score 1 0 0 0 1  Altered sleeping 3 - - - -  Tired, decreased energy 1 - - - -  Change in appetite 1 - - - -  Feeling bad or failure about yourself  0 - - - -  Trouble concentrating 0 - - - -  Moving slowly or fidgety/restless 1 - - - -  Suicidal thoughts 0 - - - -  PHQ-9 Score 7 - - - -  Difficult doing work/chores Somewhat difficult - - - -   Interpretation of Total Score  Total Score Depression Severity:  1-4 = Minimal depression, 5-9 = Mild depression, 10-14 = Moderate depression, 15-19 = Moderately severe depression, 20-27 = Severe depression    Psychosocial Evaluation and Intervention:  Psychosocial Evaluation - 01/20/24 1447       Psychosocial Evaluation & Interventions   Comments Oscar [name] has no barriers to attending the program. He lives alone. He is ready to start the program to work on exercise and learning more about amanging his heart failure.    Expected Outcomes STG attend all scheduled sessions, work on exercise progression as tolerated. Attend education sessions LTG COntinues with exercise progression, utilizes nutrition and education information to continue managing his health    Continue Psychosocial Services  Follow up required by staff             Psychosocial Re-Evaluation:   Psychosocial Discharge (Final Psychosocial Re-Evaluation):   Vocational Rehabilitation: Provide vocational rehab assistance to qualifying candidates.   Vocational Rehab Evaluation & Intervention:   Education: Education Goals: Education classes will be provided on a  variety of topics geared toward better understanding of heart health and risk factor modification. Participant will state understanding/return demonstration of topics presented as noted by education test scores.  Learning Barriers/Preferences:   General Cardiac Education Topics:  AED/CPR: - Group verbal and written instruction with the use of models to demonstrate the basic use of the AED with the basic ABC's of resuscitation.   Anatomy and Cardiac Procedures: - Group verbal and visual presentation and models provide information about basic cardiac anatomy and function. Reviews the testing methods done to diagnose heart disease and the outcomes of the test results. Describes the treatment choices: Medical Management, Angioplasty, or Coronary Bypass Surgery for treating various heart conditions including Myocardial Infarction, Angina, Valve Disease, and Cardiac Arrhythmias.  Written material given at graduation. Flowsheet Row Cardiac Rehab from 01/26/2024 in [facility]  Cardiac and Pulmonary Rehab  Education need identified 01/26/24       Medication Safety: - Group verbal and visual instruction to review commonly prescribed medications for heart and lung disease. Reviews the medication, class of the drug, and side effects. Includes the steps to properly store meds and maintain the prescription regimen.  Written material given at graduation.   Intimacy: - Group verbal instruction through game format to discuss how heart and lung disease can affect sexual intimacy. Written material given at graduation..   Know Your Numbers and Heart Failure: - Group verbal and visual instruction to discuss disease risk factors for cardiac and pulmonary disease and treatment options.  Reviews associated critical values for Overweight/Obesity, Hypertension, Cholesterol, and Diabetes.  Discusses basics of heart failure: signs/symptoms and treatments.  Introduces Heart Failure Zone chart for action plan for heart failure.  Written material given at graduation. Flowsheet Row Cardiac Rehab from 01/26/2024 in [facility] Cardiac and Pulmonary Rehab  Education need identified 01/26/24       Infection Prevention: - Provides verbal and written material to individual with discussion of infection control including proper hand washing and proper equipment cleaning during exercise session. Flowsheet Row Cardiac Rehab from 01/26/2024 in [facility] Cardiac and Pulmonary Rehab  Date 01/26/24  Educator MB  Instruction Review Code 1- Verbalizes Understanding       Falls Prevention: - Provides verbal and written material to individual with discussion of falls prevention and safety. Flowsheet Row Cardiac Rehab from 01/26/2024 in [facility] Cardiac and Pulmonary Rehab  Date 01/26/24  Educator MB  Instruction Review Code 1- Verbalizes Understanding       Other: -Provides group and verbal instruction on various topics (see comments)   Knowledge Questionnaire Score:  Knowledge Questionnaire Score -  01/26/24 1038       Knowledge Questionnaire Score   Pre Score 20/26             Core Components/Risk Factors/Patient Goals at Admission:  Personal Goals and Risk Factors at Admission - 01/26/24 1038       Core Components/Risk Factors/Patient Goals on Admission    Weight Management Yes;Obesity;Weight Loss    Intervention Weight Management: Develop a combined nutrition and exercise program designed to reach desired caloric intake, while maintaining appropriate intake of nutrient and fiber, sodium and fats, and appropriate energy expenditure required for the weight goal.;Weight Management: Provide education and appropriate resources to help participant work on and attain dietary goals.;Weight Management/Obesity: Establish reasonable short term and long term weight goals.;Obesity: Provide education and appropriate resources to help participant work on and attain dietary goals.    Admit Weight 308 lb 4.8 oz (139.8 kg)  Goal Weight: Short Term 274 lb (124.3 kg)    Goal Weight: Long Term 240 lb (108.9 kg)    Expected Outcomes Short Term: Continue to assess and modify interventions until short term weight is achieved;Long Term: Adherence to nutrition and physical activity/exercise program aimed toward attainment of established weight goal;Weight Loss: Understanding of general recommendations for a balanced deficit meal plan, which promotes 1-2 lb weight loss per week and includes a negative energy balance of [phone] kcal/d;Understanding recommendations for meals to include 15-35% energy as protein, 25-35% energy from fat, 35-60% energy from carbohydrates, less than 200mg  of dietary cholesterol, 20-35 gm of total fiber daily;Understanding of distribution of calorie intake throughout the day with the consumption of 4-5 meals/snacks    Diabetes Yes    Intervention Provide education about signs/symptoms and action to take for hypo/hyperglycemia.;Provide education about proper nutrition, including  hydration, and aerobic/resistive exercise prescription along with prescribed medications to achieve blood glucose in normal ranges: Fasting glucose 65-99 mg/dL    Expected Outcomes Short Term: Participant verbalizes understanding of the signs/symptoms and immediate care of hyper/hypoglycemia, proper foot care and importance of medication, aerobic/resistive exercise and nutrition plan for blood glucose control.;Long Term: Attainment of HbA1C < 7%.    Heart Failure Yes    Intervention Provide a combined exercise and nutrition program that is supplemented with education, support and counseling about heart failure. Directed toward relieving symptoms such as shortness of breath, decreased exercise tolerance, and extremity edema.    Expected Outcomes Improve functional capacity of life;Short term: Attendance in program 2-3 days a week with increased exercise capacity. Reported lower sodium intake. Reported increased fruit and vegetable intake. Reports medication compliance.;Short term: Daily weights obtained and reported for increase. Utilizing diuretic protocols set by physician.;Long term: Adoption of self-care skills and reduction of barriers for early signs and symptoms recognition and intervention leading to self-care maintenance.    Hypertension Yes    Intervention Provide education on lifestyle modifcations including regular physical activity/exercise, weight management, moderate sodium restriction and increased consumption of fresh fruit, vegetables, and low fat dairy, alcohol moderation, and smoking cessation.;Monitor prescription use compliance.    Expected Outcomes Short Term: Continued assessment and intervention until BP is < 140/58mm HG in hypertensive participants. < 130/70mm HG in hypertensive participants with diabetes, heart failure or chronic kidney disease.;Long Term: Maintenance of blood pressure at goal levels.    Lipids Yes    Intervention Provide education and support for participant on  nutrition & aerobic/resistive exercise along with prescribed medications to achieve LDL 70mg , HDL >40mg .    Expected Outcomes Short Term: Participant states understanding of desired cholesterol values and is compliant with medications prescribed. Participant is following exercise prescription and nutrition guidelines.;Long Term: Cholesterol controlled with medications as prescribed, with individualized exercise RX and with personalized nutrition plan. Value goals: LDL < 70mg , HDL > 40 mg.             Education:Diabetes - Individual verbal and written instruction to review signs/symptoms of diabetes, desired ranges of glucose level fasting, after meals and with exercise. Acknowledge that pre and post exercise glucose checks will be done for 3 sessions at entry of program.   Core Components/Risk Factors/Patient Goals Review:    Core Components/Risk Factors/Patient Goals at Discharge (Final Review):    ITP Comments:  ITP Comments     Row Name 01/20/24 1443 01/26/24 1027         ITP Comments Virtual orientation call completed today. he has an appointment on Date:  01/26/2024  for EP eval and gym Orientation.  Documentation of diagnosis can be found in [facility] 12/16/2023 . Completed and gym orientation for cardiac rehab. Initial ITP created and sent for review to Dr. Firman Hughes, Medical Director.               Comments: Initial ITP

## 2024-02-01 ENCOUNTER — Encounter: Attending: Family Medicine

## 2024-02-01 ENCOUNTER — Encounter

## 2024-02-01 DIAGNOSIS — I5022 Chronic systolic (congestive) heart failure: Secondary | ICD-10-CM | POA: Insufficient documentation

## 2024-02-01 LAB — GLUCOSE, CAPILLARY
Glucose-Capillary: 213 mg/dL — ABNORMAL HIGH (ref 70–99)
Glucose-Capillary: 217 mg/dL — ABNORMAL HIGH (ref 70–99)

## 2024-02-01 NOTE — Progress Notes (Signed)
 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 Says he has tried to move away from soda, reports he was drinking 4-6L of soda a day. Explained to him the negative impact sugary beverages have on his health, blood sugar and weight. Set goal to switch over and drink more water, with short term goal of ~32-48oz of water daily with 64-80oz as a long term goal. Educated on carb portions of ~30-60g per meal, the importance of eating throughout the day and choosing high quality carbs rather than junk food and sugary beverages. Provided him with Mediterranean diet handout. Educated on types of fats, sources, and how to read labels. He reports he adds salt to his foods, has been told to limit and look for better alternatives like misses dash. Confirmed and encouraged him to not salt food. Reinforced focus on keeping carb portions controlled at meals and limiting sodium.     Goal 1: Cut out sugary beverages, drink 32-48oz water daily Goal 2: Eat 15-30gProtein and 30-60gCarbs at each meal. Goal 3: Read labels and reduce sodium intake to below 2300mg . Ideally 1500mg  per day.   End time 11:02 AM

## 2024-02-01 NOTE — Progress Notes (Signed)
 Cardiac Individual Treatment Plan  Patient Details  Name: Oscar [name] MRN: [id number] Date of Birth: 01-Jan-1976 Referring Provider:   Flowsheet Row Cardiac Rehab from 01/26/2024 in [facility] Cardiac and Pulmonary Rehab  Referring Provider Burney Carter, MD       Initial Encounter Date:  Flowsheet Row Cardiac Rehab from 01/26/2024 in [facility] Cardiac and Pulmonary Rehab  Date 01/26/24       Visit Diagnosis: Chronic systolic heart failure (HCC)  Patient's Home Medications on Admission:  Current Outpatient Medications:    albuterol  (VENTOLIN  HFA) 108 (90 Base) MCG/ACT inhaler, Inhale 2 puffs into the lungs every 6 (six) hours as needed for wheezing or shortness of breath., Disp: 8 g, Rfl: 2   allopurinol  (ZYLOPRIM ) 100 MG tablet, Take 100 mg by mouth daily., Disp: , Rfl:    amiodarone  (PACERONE ) 200 MG tablet, Take 1 tablet (200 mg total) by mouth 2 (two) times daily. (Patient taking differently: Take 200 mg by mouth daily.), Disp: 60 tablet, Rfl: 0   amLODipine  (NORVASC ) 5 MG tablet, Take 5 mg by mouth daily., Disp: , Rfl:    atorvastatin  (LIPITOR ) 40 MG tablet, Take 40 mg by mouth daily., Disp: , Rfl:    colchicine  0.6 MG tablet, Take 0.6 mg by mouth daily., Disp: , Rfl:    dapagliflozin  propanediol (FARXIGA ) 10 MG TABS tablet, Take 1 tablet (10 mg total) by mouth daily., Disp: 30 tablet, Rfl: 2   doxycycline  (VIBRAMYCIN ) 100 MG capsule, Take 1 capsule (100 mg total) by mouth every 12 (twelve) hours., Disp: 60 capsule, Rfl: 0   Fluticasone -Umeclidin-Vilant (TRELEGY ELLIPTA ) 100-62.5-25 MCG/ACT AEPB, Inhale 1 puff into the lungs daily., Disp: 60 each, Rfl: 11   losartan  (COZAAR ) 25 MG tablet, Take 1 tablet (25 mg total) by mouth daily., Disp: 30 tablet, Rfl: 11   metoprolol  succinate (TOPROL -XL) 25 MG 24 hr tablet, Take 1 tablet (25 mg total) by mouth daily. Take with or immediately following a meal., Disp: 30 tablet, Rfl: 11   NON FORMULARY, Pt uses a cpap nightly, Disp: , Rfl:     pantoprazole  (PROTONIX ) 40 MG tablet, Take 1 tablet (40 mg total) by mouth daily., Disp: , Rfl:    torsemide  (DEMADEX ) 20 MG tablet, Take 1 tablet (20 mg total) by mouth daily., Disp: , Rfl:   Past Medical History: Past Medical History:  Diagnosis Date   AICD (automatic cardioverter/defibrillator) present    Aortic atherosclerosis (HCC)    Asthma    Chronic respiratory failure (HCC)    CKD (chronic kidney disease), stage III (HCC)    COPD (chronic obstructive pulmonary disease) (HCC)    Deafness in right ear    Diabetes mellitus without complication (HCC)    Dyspnea    GERD (gastroesophageal reflux disease)    Gout    HFrEF (heart failure with reduced ejection fraction) (HCC)    a.) TTE 12/10/14: EF 25%; diff inf HK; sev LV and mod LA dil; LVH. b.) TTE 06/23/18: EF 20-25%; LVH; mild LV dil, mild BAE. c.) TTE 03/01/19: EF 15%, LVH, BAE; triv PR/TR. d.) TTE 02/27/20: EF < 20%; sev LV dil; mild MR. e.) TTE 11/23/20: EF < 20%; glob HK; sev LV dil; LVH; mild-mod BAE; mod-sev TR, triv AR; G1DD. f.) TTE 05/14/21: EF <15%; LVH; sev LA and mild RV enlar; triv AR/PR, mild TR, mod MR.   Hiatal hernia    History of cardiac catheterization    a.) R/LHC 12/14/2009: normal coronaries. b.) R/LHC 12/11/2014: normal coronaries.  Hyperlipidemia    Hypertension    Hypoxemia    LBBB (left bundle branch block)    NICM (nonischemic cardiomyopathy; dilated cardiomyopathy) (HCC)    a.) R/LHC 12/14/2009: normal cors; LVEDP 18 mmHg, mean PA 29 mmHg, mean PCWP 31 mmHg; CO 6 L/min; CI 2.54 L/min/m. b.) TTE 12/10/2014: EF 25%. c.) R/LHC 12/11/2014: mean RA 9 mmHg, mean PA 22 mmHg, mean PCWP 22 mmHg. d.) TTE 06/23/2018: EF 20-25%. e.) TTE 03/01/2019: EF 15%. f.) TTE 02/27/2020: < 20%. g.) TTE 11/23/2020: EF <20%. h.) TTE 05/14/2021: < 15%.   NSTEMI (non-ST elevated myocardial infarction) (HCC)    a.) x 3 per patient report ---> 2012, 2014, 2016   Obesity    On supplemental oxygen by nasal cannula    a.) 2-3 L/[place] PRN    OSA on CPAP    PAF (paroxysmal atrial fibrillation) (HCC)    a.) CHA2DS2-VASc = 4 (HFrEF, HTN, prior MI, T2DM). b.) rate/rhythm maintained without pharmacologial intervention; no current anticoagulation.   Pancreatitis    PSVT (paroxysmal supraventricular tachycardia) (HCC)     Tobacco Use: Social History   Tobacco Use  Smoking Status Former   Current packs/day: 0.00   Average packs/day: 1.5 packs/day for 34.0 years (51.0 ttl pk-yrs)   Types: Cigarettes   Quit date: 05/2023   Years since quitting: 0.7   Passive exposure: Past  Smokeless Tobacco Never  Tobacco Comments   Quit smoking 05/2023    Labs: Review Flowsheet  More data exists      Latest Ref Rng & Units 12/25/2021 09/30/2022 06/13/2023 12/03/2023 12/04/2023  Labs for ITP Cardiac and Pulmonary Rehab  Cholestrol 0 - 200 mg/dL - 914  - - -  LDL (calc) 0 - 99 mg/dL - 782  - - -  HDL-C >95 mg/dL - 35  - - -  Trlycerides <150 mg/dL - 98  - - -  Hemoglobin A1c 4.8 - 5.6 % 6.8  - 7.3  8.5  -  Bicarbonate 20.0 - 28.0 mmol/L - - - - 29.2   O2 Saturation % - - - - 89.3      Exercise Target Goals: Exercise Program Goal: Individual exercise prescription set using results from initial 6 min walk test and THRR while considering  patient's activity barriers and safety.   Exercise Prescription Goal: Initial exercise prescription builds to 30-45 minutes a day of aerobic activity, 2-3 days per week.  Home exercise guidelines will be given to patient during program as part of exercise prescription that the participant will acknowledge.   Education: Aerobic Exercise: - Group verbal and visual presentation on the components of exercise prescription. Introduces F.I.T.T principle from ACSM for exercise prescriptions.  Reviews F.I.T.T. principles of aerobic exercise including progression. Written material given at graduation. Flowsheet Row Cardiac Rehab from 01/26/2024 in [facility] Cardiac and Pulmonary Rehab  Education need identified  01/26/24       Education: Resistance Exercise: - Group verbal and visual presentation on the components of exercise prescription. Introduces F.I.T.T principle from ACSM for exercise prescriptions  Reviews F.I.T.T. principles of resistance exercise including progression. Written material given at graduation. Flowsheet Row Cardiac Rehab from 01/26/2024 in [facility] Cardiac and Pulmonary Rehab  Education need identified 01/26/24        Education: Exercise & Equipment Safety: - Individual verbal instruction and demonstration of equipment use and safety with use of the equipment. Flowsheet Row Cardiac Rehab from 01/26/2024 in [facility] Cardiac and Pulmonary Rehab  Date 01/26/24  Educator MB  Instruction Review Code 1- Verbalizes Understanding       Education: Exercise Physiology & General Exercise Guidelines: - Group verbal and written instruction with models to review the exercise physiology of the cardiovascular system and associated critical values. Provides general exercise guidelines with specific guidelines to those with heart or lung disease.  Flowsheet Row Cardiac Rehab from 01/26/2024 in [facility] Cardiac and Pulmonary Rehab  Education need identified 01/26/24       Education: Flexibility, Balance, Mind/Body Relaxation: - Group verbal and visual presentation with interactive activity on the components of exercise prescription. Introduces F.I.T.T principle from ACSM for exercise prescriptions. Reviews F.I.T.T. principles of flexibility and balance exercise training including progression. Also discusses the mind body connection.  Reviews various relaxation techniques to help reduce and manage stress (i.e. Deep breathing, progressive muscle relaxation, and visualization). Balance handout provided to take home. Written material given at graduation.   Activity Barriers & Risk Stratification:  Activity Barriers & Cardiac Risk Stratification - 01/26/24 1028       Activity Barriers & Cardiac Risk  Stratification   Activity Barriers Joint Problems;Other (comment)    Comments Elbow joint, gout in both feet    Cardiac Risk Stratification High             6 Minute Walk:  6 Minute Walk     Row Name 01/26/24 1027         6 Minute Walk   Phase Initial     Distance 800 feet     Walk Time 6 minutes     # of Rest Breaks 0     MPH 1.52     METS 2.06     RPE 11     Perceived Dyspnea  1     VO2 Peak 7.21     Symptoms No     Resting HR 60 bpm     Resting BP 104/70     Resting Oxygen Saturation  98 %     Exercise Oxygen Saturation  during 6 min walk 97 %     Max Ex. HR 100 bpm     Max Ex. BP 118/80     2 Minute Post BP 100/70              Oxygen Initial Assessment:  Oxygen Initial Assessment - 01/20/24 1430       Home Oxygen   Home Oxygen Device Home Concentrator;E-Tanks    Sleep Oxygen Prescription Continuous;CPAP    Liters per minute 3    Home Exercise Oxygen Prescription Continuous    Liters per minute 3   when winded, 2 l when not winded   Home Resting Oxygen Prescription Continuous    Liters per minute 2    Compliance with Home Oxygen Use Yes      Intervention   Short Term Goals To learn and exhibit compliance with exercise, home and travel O2 prescription;To learn and understand importance of monitoring SPO2 with pulse oximeter and demonstrate accurate use of the pulse oximeter.;To learn and understand importance of maintaining oxygen saturations>88%;To learn and demonstrate proper pursed lip breathing techniques or other breathing techniques. ;To learn and demonstrate proper use of respiratory medications    Long  Term Goals Exhibits compliance with exercise, home  and travel O2 prescription;Verbalizes importance of monitoring SPO2 with pulse oximeter and return demonstration;Maintenance of O2 saturations>88%;Exhibits proper breathing techniques, such as pursed lip breathing or other method taught during program session;Compliance with respiratory  medication;Demonstrates proper use of MDI's  Oxygen Re-Evaluation:  Oxygen Re-Evaluation     Row Name 02/01/24 1014             Home Oxygen   Home Oxygen Device Home Concentrator;E-Tanks       Sleep Oxygen Prescription Continuous;CPAP       Liters per minute 3       Home Exercise Oxygen Prescription Continuous       Liters per minute 3       Home Resting Oxygen Prescription Continuous       Liters per minute 2       Compliance with Home Oxygen Use Yes         Goals/Expected Outcomes   Short Term Goals To learn and exhibit compliance with exercise, home and travel O2 prescription;To learn and understand importance of monitoring SPO2 with pulse oximeter and demonstrate accurate use of the pulse oximeter.;To learn and understand importance of maintaining oxygen saturations>88%;To learn and demonstrate proper pursed lip breathing techniques or other breathing techniques. ;To learn and demonstrate proper use of respiratory medications       Long  Term Goals Exhibits compliance with exercise, home  and travel O2 prescription;Verbalizes importance of monitoring SPO2 with pulse oximeter and return demonstration;Maintenance of O2 saturations>88%;Exhibits proper breathing techniques, such as pursed lip breathing or other method taught during program session;Compliance with respiratory medication;Demonstrates proper use of MDI's       Comments Reviewed RPE and dyspnea scale, THR and program prescription with pt today.  Pt voiced understanding and was given a copy of goals to take home.       Goals/Expected Outcomes Short: Use RPE daily to regulate intensity. Long: Follow program prescription in THR.                Oxygen Discharge (Final Oxygen Re-Evaluation):  Oxygen Re-Evaluation - 02/01/24 1014       Home Oxygen   Home Oxygen Device Home Concentrator;E-Tanks    Sleep Oxygen Prescription Continuous;CPAP    Liters per minute 3    Home Exercise Oxygen Prescription  Continuous    Liters per minute 3    Home Resting Oxygen Prescription Continuous    Liters per minute 2    Compliance with Home Oxygen Use Yes      Goals/Expected Outcomes   Short Term Goals To learn and exhibit compliance with exercise, home and travel O2 prescription;To learn and understand importance of monitoring SPO2 with pulse oximeter and demonstrate accurate use of the pulse oximeter.;To learn and understand importance of maintaining oxygen saturations>88%;To learn and demonstrate proper pursed lip breathing techniques or other breathing techniques. ;To learn and demonstrate proper use of respiratory medications    Long  Term Goals Exhibits compliance with exercise, home  and travel O2 prescription;Verbalizes importance of monitoring SPO2 with pulse oximeter and return demonstration;Maintenance of O2 saturations>88%;Exhibits proper breathing techniques, such as pursed lip breathing or other method taught during program session;Compliance with respiratory medication;Demonstrates proper use of MDI's    Comments Reviewed RPE and dyspnea scale, THR and program prescription with pt today.  Pt voiced understanding and was given a copy of goals to take home.    Goals/Expected Outcomes Short: Use RPE daily to regulate intensity. Long: Follow program prescription in THR.             Initial Exercise Prescription:  Initial Exercise Prescription - 01/26/24 1000       Date of Initial Exercise RX and Referring Provider   Date 01/26/24  Referring Provider Burney Carter, MD      Oxygen   Oxygen Continuous    Liters 2-3L    Maintain Oxygen Saturation 88% or higher      Treadmill   MPH 1.4    Grade 0    Minutes 15    METs 2.07      Recumbant Bike   Level 2    RPM 50    Watts 25    Minutes 15    METs 2.06      NuStep   Level 2    SPM 80    Minutes 15    METs 2.06      T5 Nustep   Level 2    SPM 80    Minutes 15    METs 2.06      Biostep-RELP   Level 2    SPM 50     Minutes 15    METs 2.06      Prescription Details   Frequency (times per week) 2    Duration Progress to 30 minutes of continuous aerobic without signs/symptoms of physical distress      Intensity   THRR 40-80% of Max Heartrate 104-149    Ratings of Perceived Exertion 11-13    Perceived Dyspnea 0-4      Progression   Progression Continue to progress workloads to maintain intensity without signs/symptoms of physical distress.      Resistance Training   Training Prescription Yes    Weight 3 lb, 5 lb   3 lb (R), 5 lb (L)   Reps 10-15             Perform Capillary Blood Glucose checks as needed.  Exercise Prescription Changes:   Exercise Prescription Changes     Row Name 01/26/24 1000             Response to Exercise   Blood Pressure (Admit) 104/70       Blood Pressure (Exercise) 118/80       Blood Pressure (Exit) 100/70       Heart Rate (Admit) 60 bpm       Heart Rate (Exercise) 100 bpm       Heart Rate (Exit) 60 bpm       Oxygen Saturation (Admit) 98 %       Oxygen Saturation (Exercise) 97 %       Oxygen Saturation (Exit) 97 %       Rating of Perceived Exertion (Exercise) 11       Perceived Dyspnea (Exercise) 1       Symptoms none       Comments results       Intensity THRR New         Progression   Average METs 2.06                Exercise Comments:   Exercise Comments     Row Name 02/01/24 1012           Exercise Comments First full day of exercise!  Patient was oriented to gym and equipment including functions, settings, policies, and procedures.  Patient's individual exercise prescription and treatment plan were reviewed.  All starting workloads were established based on the results of the 6 minute walk test done at initial orientation visit.  The plan for exercise progression was also introduced and progression will be customized based on patient's performance and goals.  Exercise Goals and Review:   Exercise  Goals     Row Name 01/26/24 1036             Exercise Goals   Increase Physical Activity Yes       Intervention Provide advice, education, support and counseling about physical activity/exercise needs.;Develop an individualized exercise prescription for aerobic and resistive training based on initial evaluation findings, risk stratification, comorbidities and participant's personal goals.       Expected Outcomes Short Term: Attend rehab on a regular basis to increase amount of physical activity.;Long Term: Add in home exercise to make exercise part of routine and to increase amount of physical activity.;Long Term: Exercising regularly at least 3-5 days a week.       Increase Strength and Stamina Yes       Intervention Provide advice, education, support and counseling about physical activity/exercise needs.;Develop an individualized exercise prescription for aerobic and resistive training based on initial evaluation findings, risk stratification, comorbidities and participant's personal goals.       Expected Outcomes Short Term: Increase workloads from initial exercise prescription for resistance, speed, and METs.;Short Term: Perform resistance training exercises routinely during rehab and add in resistance training at home;Long Term: Improve cardiorespiratory fitness, muscular endurance and strength as measured by increased METs and functional capacity ( )       Able to understand and use rate of perceived exertion (RPE) scale Yes       Intervention Provide education and explanation on how to use RPE scale       Expected Outcomes Short Term: Able to use RPE daily in rehab to express subjective intensity level;Long Term:  Able to use RPE to guide intensity level when exercising independently       Able to understand and use Dyspnea scale Yes       Intervention Provide education and explanation on how to use Dyspnea scale       Expected Outcomes Short Term: Able to use Dyspnea scale daily in  rehab to express subjective sense of shortness of breath during exertion;Long Term: Able to use Dyspnea scale to guide intensity level when exercising independently       Knowledge and understanding of Target Heart Rate Range (THRR) Yes       Intervention Provide education and explanation of THRR including how the numbers were predicted and where they are located for reference       Expected Outcomes Short Term: Able to state/look up THRR;Short Term: Able to use daily as guideline for intensity in rehab;Long Term: Able to use THRR to govern intensity when exercising independently       Able to check pulse independently Yes       Intervention Provide education and demonstration on how to check pulse in carotid and radial arteries.;Review the importance of being able to check your own pulse for safety during independent exercise       Expected Outcomes Short Term: Able to explain why pulse checking is important during independent exercise;Long Term: Able to check pulse independently and accurately       Understanding of Exercise Prescription Yes       Intervention Provide education, explanation, and written materials on patient's individual exercise prescription       Expected Outcomes Short Term: Able to explain program exercise prescription;Long Term: Able to explain home exercise prescription to exercise independently                Exercise Goals Re-Evaluation :  Discharge Exercise Prescription (Final Exercise Prescription Changes):  Exercise Prescription Changes - 01/26/24 1000       Response to Exercise   Blood Pressure (Admit) 104/70    Blood Pressure (Exercise) 118/80    Blood Pressure (Exit) 100/70    Heart Rate (Admit) 60 bpm    Heart Rate (Exercise) 100 bpm    Heart Rate (Exit) 60 bpm    Oxygen Saturation (Admit) 98 %    Oxygen Saturation (Exercise) 97 %    Oxygen Saturation (Exit) 97 %    Rating of Perceived Exertion (Exercise) 11    Perceived Dyspnea (Exercise) 1     Symptoms none    Comments results    Intensity THRR New      Progression   Average METs 2.06             Nutrition:  Target Goals: Understanding of nutrition guidelines, daily intake of sodium 1500mg , cholesterol 200mg , calories 30% from fat and 7% or less from saturated fats, daily to have 5 or more servings of fruits and vegetables.  Education: All About Nutrition: -Group instruction provided by verbal, written material, interactive activities, discussions, models, and posters to present general guidelines for heart healthy nutrition including fat, fiber, MyPlate, the role of sodium in heart healthy nutrition, utilization of the nutrition label, and utilization of this knowledge for meal planning. Follow up email sent as well. Written material given at graduation. Flowsheet Row Cardiac Rehab from 01/26/2024 in [facility] Cardiac and Pulmonary Rehab  Education need identified 01/26/24       Biometrics:  Pre Biometrics - 01/26/24 1036       Pre Biometrics   Height 5' 9.1" (1.755 m)    Weight 308 lb 4.8 oz (139.8 kg)    Waist Circumference 58 inches    Hip Circumference 55 inches    Waist to Hip Ratio 1.05 %    BMI (Calculated) 45.4    Single Leg Stand 7.9 seconds              Nutrition Therapy Plan and Nutrition Goals:  Nutrition Therapy & Goals - 02/01/24 1340       Nutrition Therapy   Diet Carb controlled, Cardiac, Low Na    Protein (specify units) 90g    Fiber 30 grams    Whole Grain Foods 3 servings    Saturated Fats 15 max. grams    Fruits and Vegetables 5 servings/day    Sodium 2 grams      Personal Nutrition Goals   Nutrition Goal Cut out sugary beverages, drink 32-48oz water daily    Personal Goal #2 Eat 15-30gProtein and 30-60gCarbs at each meal.    Personal Goal #3 Read labels and reduce sodium intake to below 2300mg . Ideally 1500mg  per day.    Comments Patient drinking mostly sugary beverages like juice and soda. Says he has tried to move away  from soda, reports he was drinking 4-6L of soda a day. Explained to him the negative impact sugary beverages have on his health, blood sugar and weight. Set goal to switch over and drink more water, with short term goal of ~32-48oz of water daily with 64-80oz as a long term goal. Educated on carb portions of ~30-60g per meal, the importance of eating throughout the day and choosing high quality carbs rather than junk food and sugary beverages. Provided him with Mediterranean diet handout. Educated on types of fats, sources, and how to read labels. He reports he adds salt to  his foods, has been told to limit and look for better alternatives like misses dash. Confirmed and encouraged him to not salt food. Reinforced focus on keeping carb portions controlled at meals and limiting sodium.      Intervention Plan   Intervention Prescribe, educate and counsel regarding individualized specific dietary modifications aiming towards targeted core components such as weight, hypertension, lipid management, diabetes, heart failure and other comorbidities.;Nutrition handout(s) given to patient.    Expected Outcomes Short Term Goal: Understand basic principles of dietary content, such as calories, fat, sodium, cholesterol and nutrients.;Short Term Goal: A plan has been developed with personal nutrition goals set during dietitian appointment.;Long Term Goal: Adherence to prescribed nutrition plan.             Nutrition Assessments:  MEDIFICTS Score Key: >=70 Need to make dietary changes  40-70 Heart Healthy Diet <= 40 Therapeutic Level Cholesterol Diet  Flowsheet Row Cardiac Rehab from 02/01/2024 in [facility] Cardiac and Pulmonary Rehab  Picture Your Plate Total Score on Admission 60      Picture Your Plate Scores: <29 Unhealthy dietary pattern with much room for improvement. 41-50 Dietary pattern unlikely to meet recommendations for good health and room for improvement. 51-60 More healthful dietary pattern, with  some room for improvement.  >60 Healthy dietary pattern, although there may be some specific behaviors that could be improved.    Nutrition Goals Re-Evaluation:   Nutrition Goals Discharge (Final Nutrition Goals Re-Evaluation):   Psychosocial: Target Goals: Acknowledge presence or absence of significant depression and/or stress, maximize coping skills, provide positive support system. Participant is able to verbalize types and ability to use techniques and skills needed for reducing stress and depression.   Education: Stress, Anxiety, and Depression - Group verbal and visual presentation to define topics covered.  Reviews how body is impacted by stress, anxiety, and depression.  Also discusses healthy ways to reduce stress and to treat/manage anxiety and depression.  Written material given at graduation.   Education: Sleep Hygiene -Provides group verbal and written instruction about how sleep can affect your health.  Define sleep hygiene, discuss sleep cycles and impact of sleep habits. Review good sleep hygiene tips.    Initial Review & Psychosocial Screening:  Initial Psych Review & Screening - 01/20/24 1432       Initial Review   Current issues with None Identified      Family Dynamics   Good Support System? Yes   siblings,     Barriers   Psychosocial barriers to participate in program There are no identifiable barriers or psychosocial needs.      Screening Interventions   Interventions Encouraged to exercise;To provide support and resources with identified psychosocial needs;Provide feedback about the scores to participant    Expected Outcomes Short Term goal: Utilizing psychosocial counselor, staff and physician to assist with identification of specific Stressors or current issues interfering with healing process. Setting desired goal for each stressor or current issue identified.;Long Term Goal: Stressors or current issues are controlled or eliminated.;Short Term goal:  Identification and review with participant of any Quality of Life or Depression concerns found by scoring the questionnaire.;Long Term goal: The participant improves quality of Life and PHQ9 Scores as seen by post scores and/or verbalization of changes             Quality of Life Scores:   Quality of Life - 02/01/24 1704       Quality of Life   Select Quality of Life  Quality of Life Scores   Health/Function Pre 18.32 %    Socioeconomic Pre 27.5 %    Psych/Spiritual Pre 30 %    Family Pre 30 %    GLOBAL Pre 23.2 %            Scores of 19 and below usually indicate a poorer quality of life in these areas.  A difference of  2-3 points is a clinically meaningful difference.  A difference of 2-3 points in the total score of the Quality of Life Index has been associated with significant improvement in overall quality of life, self-image, physical symptoms, and general health in studies assessing change in quality of life.  PHQ-9: Review Flowsheet  More data exists      01/26/2024 10/17/2021 06/15/2018 05/18/2018 03/05/2018  Depression screen PHQ 2/9  Decreased Interest 1 0 0 0 0  Down, Depressed, Hopeless 0 0 0 0 1  PHQ - 2 Score 1 0 0 0 1  Altered sleeping 3 - - - -  Tired, decreased energy 1 - - - -  Change in appetite 1 - - - -  Feeling bad or failure about yourself  0 - - - -  Trouble concentrating 0 - - - -  Moving slowly or fidgety/restless 1 - - - -  Suicidal thoughts 0 - - - -  PHQ-9 Score 7 - - - -  Difficult doing work/chores Somewhat difficult - - - -   Interpretation of Total Score  Total Score Depression Severity:  1-4 = Minimal depression, 5-9 = Mild depression, 10-14 = Moderate depression, 15-19 = Moderately severe depression, 20-27 = Severe depression   Psychosocial Evaluation and Intervention:  Psychosocial Evaluation - 01/20/24 1447       Psychosocial Evaluation & Interventions   Comments Oscar [name] has no barriers to attending the program. He lives  alone. He is ready to start the program to work on exercise and learning more about amanging his heart failure.    Expected Outcomes STG attend all scheduled sessions, work on exercise progression as tolerated. Attend education sessions LTG COntinues with exercise progression, utilizes nutrition and education information to continue managing his health    Continue Psychosocial Services  Follow up required by staff             Psychosocial Re-Evaluation:   Psychosocial Discharge (Final Psychosocial Re-Evaluation):   Vocational Rehabilitation: Provide vocational rehab assistance to qualifying candidates.   Vocational Rehab Evaluation & Intervention:   Education: Education Goals: Education classes will be provided on a variety of topics geared toward better understanding of heart health and risk factor modification. Participant will state understanding/return demonstration of topics presented as noted by education test scores.  Learning Barriers/Preferences:   General Cardiac Education Topics:  AED/CPR: - Group verbal and written instruction with the use of models to demonstrate the basic use of the AED with the basic ABC's of resuscitation.   Anatomy and Cardiac Procedures: - Group verbal and visual presentation and models provide information about basic cardiac anatomy and function. Reviews the testing methods done to diagnose heart disease and the outcomes of the test results. Describes the treatment choices: Medical Management, Angioplasty, or Coronary Bypass Surgery for treating various heart conditions including Myocardial Infarction, Angina, Valve Disease, and Cardiac Arrhythmias.  Written material given at graduation. Flowsheet Row Cardiac Rehab from 01/26/2024 in [facility] Cardiac and Pulmonary Rehab  Education need identified 01/26/24       Medication Safety: - Group verbal and visual instruction  to review commonly prescribed medications for heart and lung disease. Reviews  the medication, class of the drug, and side effects. Includes the steps to properly store meds and maintain the prescription regimen.  Written material given at graduation.   Intimacy: - Group verbal instruction through game format to discuss how heart and lung disease can affect sexual intimacy. Written material given at graduation..   Know Your Numbers and Heart Failure: - Group verbal and visual instruction to discuss disease risk factors for cardiac and pulmonary disease and treatment options.  Reviews associated critical values for Overweight/Obesity, Hypertension, Cholesterol, and Diabetes.  Discusses basics of heart failure: signs/symptoms and treatments.  Introduces Heart Failure Zone chart for action plan for heart failure.  Written material given at graduation. Flowsheet Row Cardiac Rehab from 01/26/2024 in [facility] Cardiac and Pulmonary Rehab  Education need identified 01/26/24       Infection Prevention: - Provides verbal and written material to individual with discussion of infection control including proper hand washing and proper equipment cleaning during exercise session. Flowsheet Row Cardiac Rehab from 01/26/2024 in [facility] Cardiac and Pulmonary Rehab  Date 01/26/24  Educator MB  Instruction Review Code 1- Verbalizes Understanding       Falls Prevention: - Provides verbal and written material to individual with discussion of falls prevention and safety. Flowsheet Row Cardiac Rehab from 01/26/2024 in [facility] Cardiac and Pulmonary Rehab  Date 01/26/24  Educator MB  Instruction Review Code 1- Verbalizes Understanding       Other: -Provides group and verbal instruction on various topics (see comments)   Knowledge Questionnaire Score:  Knowledge Questionnaire Score - 01/26/24 1038       Knowledge Questionnaire Score   Pre Score 20/26             Core Components/Risk Factors/Patient Goals at Admission:  Personal Goals and Risk Factors at Admission - 01/26/24 1038        Core Components/Risk Factors/Patient Goals on Admission    Weight Management Yes;Obesity;Weight Loss    Intervention Weight Management: Develop a combined nutrition and exercise program designed to reach desired caloric intake, while maintaining appropriate intake of nutrient and fiber, sodium and fats, and appropriate energy expenditure required for the weight goal.;Weight Management: Provide education and appropriate resources to help participant work on and attain dietary goals.;Weight Management/Obesity: Establish reasonable short term and long term weight goals.;Obesity: Provide education and appropriate resources to help participant work on and attain dietary goals.    Admit Weight 308 lb 4.8 oz (139.8 kg)    Goal Weight: Short Term 274 lb (124.3 kg)    Goal Weight: Long Term 240 lb (108.9 kg)    Expected Outcomes Short Term: Continue to assess and modify interventions until short term weight is achieved;Long Term: Adherence to nutrition and physical activity/exercise program aimed toward attainment of established weight goal;Weight Loss: Understanding of general recommendations for a balanced deficit meal plan, which promotes 1-2 lb weight loss per week and includes a negative energy balance of [phone] kcal/d;Understanding recommendations for meals to include 15-35% energy as protein, 25-35% energy from fat, 35-60% energy from carbohydrates, less than 200mg  of dietary cholesterol, 20-35 gm of total fiber daily;Understanding of distribution of calorie intake throughout the day with the consumption of 4-5 meals/snacks    Diabetes Yes    Intervention Provide education about signs/symptoms and action to take for hypo/hyperglycemia.;Provide education about proper nutrition, including hydration, and aerobic/resistive exercise prescription along with prescribed medications to achieve blood glucose in normal ranges:  Fasting glucose 65-99 mg/dL    Expected Outcomes Short Term: Participant verbalizes  understanding of the signs/symptoms and immediate care of hyper/hypoglycemia, proper foot care and importance of medication, aerobic/resistive exercise and nutrition plan for blood glucose control.;Long Term: Attainment of HbA1C < 7%.    Heart Failure Yes    Intervention Provide a combined exercise and nutrition program that is supplemented with education, support and counseling about heart failure. Directed toward relieving symptoms such as shortness of breath, decreased exercise tolerance, and extremity edema.    Expected Outcomes Improve functional capacity of life;Short term: Attendance in program 2-3 days a week with increased exercise capacity. Reported lower sodium intake. Reported increased fruit and vegetable intake. Reports medication compliance.;Short term: Daily weights obtained and reported for increase. Utilizing diuretic protocols set by physician.;Long term: Adoption of self-care skills and reduction of barriers for early signs and symptoms recognition and intervention leading to self-care maintenance.    Hypertension Yes    Intervention Provide education on lifestyle modifcations including regular physical activity/exercise, weight management, moderate sodium restriction and increased consumption of fresh fruit, vegetables, and low fat dairy, alcohol moderation, and smoking cessation.;Monitor prescription use compliance.    Expected Outcomes Short Term: Continued assessment and intervention until BP is < 140/47mm HG in hypertensive participants. < 130/63mm HG in hypertensive participants with diabetes, heart failure or chronic kidney disease.;Long Term: Maintenance of blood pressure at goal levels.    Lipids Yes    Intervention Provide education and support for participant on nutrition & aerobic/resistive exercise along with prescribed medications to achieve LDL 70mg , HDL >40mg .    Expected Outcomes Short Term: Participant states understanding of desired cholesterol values and is compliant  with medications prescribed. Participant is following exercise prescription and nutrition guidelines.;Long Term: Cholesterol controlled with medications as prescribed, with individualized exercise RX and with personalized nutrition plan. Value goals: LDL < 70mg , HDL > 40 mg.             Education:Diabetes - Individual verbal and written instruction to review signs/symptoms of diabetes, desired ranges of glucose level fasting, after meals and with exercise. Acknowledge that pre and post exercise glucose checks will be done for 3 sessions at entry of program.   Core Components/Risk Factors/Patient Goals Review:    Core Components/Risk Factors/Patient Goals at Discharge (Final Review):    ITP Comments:  ITP Comments     Row Name 01/20/24 1443 01/26/24 1027 02/01/24 1012       ITP Comments Virtual orientation call completed today. he has an appointment on Date: 01/26/2024  for EP eval and gym Orientation.  Documentation of diagnosis can be found in [facility] 12/16/2023 . Completed and gym orientation for cardiac rehab. Initial ITP created and sent for review to Dr. Firman Hughes, Medical Director. First full day of exercise!  Patient was oriented to gym and equipment including functions, settings, policies, and procedures.  Patient's individual exercise prescription and treatment plan were reviewed.  All starting workloads were established based on the results of the 6 minute walk test done at initial orientation visit.  The plan for exercise progression was also introduced and progression will be customized based on patient's performance and goals.              Comments: Updated Initial ITP

## 2024-02-01 NOTE — Progress Notes (Signed)
 Daily Session Note  Patient Details  Name: Oscar [name] MRN: [id number] Date of Birth: [birth year] Referring Provider:   Flowsheet Row Cardiac Rehab from 01/26/2024 in [facility] Cardiac and Pulmonary Rehab  Referring Provider Burney Carter, MD       Encounter Date: 02/01/2024  Check In:  Session Check In - 02/01/24 1011       Check-In   Supervising physician immediately available to respond to emergencies See telemetry face sheet for immediately available ER MD    Location [facility]-Cardiac & Pulmonary Rehab    Staff Present Lyell Samuel, MS, Exercise Physiologist;Hadassa Cermak Dawne Euler, ACSM CEP, Exercise Physiologist;Zavannah Deblois RN,BSN,MPA;Joseph Hood RCP,RRT,BSRT    Virtual Visit No    Medication changes reported     No    Fall or balance concerns reported    No    Warm-up and Cool-down Performed on first and last piece of equipment    Resistance Training Performed Yes    VAD Patient? No    PAD/SET Patient? No      Pain Assessment   Currently in Pain? No/denies                Social History   Tobacco Use  Smoking Status Former   Current packs/day: 0.00   Average packs/day: 1.5 packs/day for 34.0 years (51.0 ttl pk-yrs)   Types: Cigarettes   Quit date: 05/2023   Years since quitting: 0.7   Passive exposure: Past  Smokeless Tobacco Never  Tobacco Comments   Quit smoking 05/2023    Goals Met:  Independence with exercise equipment Exercise tolerated well No report of concerns or symptoms today Strength training completed today  Goals Unmet:  Not Applicable  Comments: First full day of exercise!  Patient was oriented to gym and equipment including functions, settings, policies, and procedures.  Patient's individual exercise prescription and treatment plan were reviewed.  All starting workloads were established based on the results of the 6 minute walk test done at initial orientation visit.  The plan for exercise progression was also introduced and progression  will be customized based on patient's performance and goals.     Dr. Firman Hughes is Medical Director for [facility] Cardiac Rehabilitation.  Dr. Fuad Aleskerov is Medical Director for [facility] Pulmonary Rehabilitation.

## 2024-02-03 ENCOUNTER — Other Ambulatory Visit: Payer: Self-pay | Admitting: Family

## 2024-02-03 ENCOUNTER — Telehealth: Payer: Self-pay | Admitting: Family

## 2024-02-03 ENCOUNTER — Ambulatory Visit
Admission: RE | Admit: 2024-02-03 | Discharge: 2024-02-03 | Disposition: A | Discharge status: Home / Self Care | Source: Ambulatory Visit | Attending: Family | Admitting: Family

## 2024-02-03 ENCOUNTER — Encounter

## 2024-02-03 ENCOUNTER — Ambulatory Visit (HOSPITAL_BASED_OUTPATIENT_CLINIC_OR_DEPARTMENT_OTHER): Admitting: Family

## 2024-02-03 ENCOUNTER — Ambulatory Visit: Payer: Self-pay | Admitting: Family

## 2024-02-03 ENCOUNTER — Encounter: Payer: Self-pay | Admitting: *Deleted

## 2024-02-03 ENCOUNTER — Encounter: Payer: Self-pay | Admitting: Family

## 2024-02-03 VITALS — BP 87/63 | HR 80 | Wt 317.0 lb

## 2024-02-03 DIAGNOSIS — I471 Supraventricular tachycardia, unspecified: Secondary | ICD-10-CM | POA: Diagnosis not present

## 2024-02-03 DIAGNOSIS — N183 Chronic kidney disease, stage 3 unspecified: Secondary | ICD-10-CM | POA: Diagnosis not present

## 2024-02-03 DIAGNOSIS — K219 Gastro-esophageal reflux disease without esophagitis: Secondary | ICD-10-CM | POA: Diagnosis not present

## 2024-02-03 DIAGNOSIS — I5022 Chronic systolic (congestive) heart failure: Secondary | ICD-10-CM

## 2024-02-03 DIAGNOSIS — E1122 Type 2 diabetes mellitus with diabetic chronic kidney disease: Secondary | ICD-10-CM | POA: Insufficient documentation

## 2024-02-03 DIAGNOSIS — Z95810 Presence of automatic (implantable) cardiac defibrillator: Secondary | ICD-10-CM | POA: Insufficient documentation

## 2024-02-03 DIAGNOSIS — G4733 Obstructive sleep apnea (adult) (pediatric): Secondary | ICD-10-CM

## 2024-02-03 DIAGNOSIS — Z794 Long term (current) use of insulin: Secondary | ICD-10-CM

## 2024-02-03 DIAGNOSIS — I480 Paroxysmal atrial fibrillation: Secondary | ICD-10-CM | POA: Diagnosis not present

## 2024-02-03 DIAGNOSIS — E119 Type 2 diabetes mellitus without complications: Secondary | ICD-10-CM | POA: Diagnosis not present

## 2024-02-03 DIAGNOSIS — R0602 Shortness of breath: Secondary | ICD-10-CM | POA: Diagnosis present

## 2024-02-03 DIAGNOSIS — I130 Hypertensive heart and chronic kidney disease with heart failure and stage 1 through stage 4 chronic kidney disease, or unspecified chronic kidney disease: Secondary | ICD-10-CM | POA: Diagnosis not present

## 2024-02-03 DIAGNOSIS — I447 Left bundle-branch block, unspecified: Secondary | ICD-10-CM | POA: Insufficient documentation

## 2024-02-03 DIAGNOSIS — I10 Essential (primary) hypertension: Secondary | ICD-10-CM

## 2024-02-03 DIAGNOSIS — Z7984 Long term (current) use of oral hypoglycemic drugs: Secondary | ICD-10-CM | POA: Insufficient documentation

## 2024-02-03 DIAGNOSIS — J4489 Other specified chronic obstructive pulmonary disease: Secondary | ICD-10-CM | POA: Diagnosis not present

## 2024-02-03 DIAGNOSIS — I252 Old myocardial infarction: Secondary | ICD-10-CM | POA: Insufficient documentation

## 2024-02-03 DIAGNOSIS — E785 Hyperlipidemia, unspecified: Secondary | ICD-10-CM | POA: Insufficient documentation

## 2024-02-03 DIAGNOSIS — M109 Gout, unspecified: Secondary | ICD-10-CM | POA: Insufficient documentation

## 2024-02-03 DIAGNOSIS — I472 Ventricular tachycardia, unspecified: Secondary | ICD-10-CM

## 2024-02-03 DIAGNOSIS — Z87891 Personal history of nicotine dependence: Secondary | ICD-10-CM | POA: Diagnosis not present

## 2024-02-03 LAB — BASIC METABOLIC PANEL WITH GFR
Anion gap: 9 (ref 5–15)
BUN: 48 mg/dL — ABNORMAL HIGH (ref 6–20)
CO2: 23 mmol/L (ref 22–32)
Calcium: 9 mg/dL (ref 8.9–10.3)
Chloride: 102 mmol/L (ref 98–111)
Creatinine, Ser: 1.78 mg/dL — ABNORMAL HIGH (ref 0.61–1.24)
GFR, Estimated: 46 mL/min — ABNORMAL LOW (ref >60)
Glucose, Bld: 189 mg/dL — ABNORMAL HIGH (ref 70–99)
Potassium: 4 mmol/L (ref 3.5–5.1)
Sodium: 134 mmol/L — ABNORMAL LOW (ref 135–145)

## 2024-02-03 LAB — BRAIN NATRIURETIC PEPTIDE: B Natriuretic Peptide: 316.2 pg/mL — ABNORMAL HIGH (ref 0.0–100.0)

## 2024-02-03 LAB — GLUCOSE, CAPILLARY: Glucose-Capillary: 199 mg/dL — ABNORMAL HIGH (ref 70–99)

## 2024-02-03 MED ORDER — MIDODRINE HCL 5 MG PO TABS: 5.0000 mg | ORAL_TABLET | Freq: Three times a day (TID) | ORAL | 5 refills | Status: DC | PRN

## 2024-02-03 MED ORDER — FUROSEMIDE 10 MG/ML IJ SOLN
INTRAMUSCULAR | Status: AC
  Filled 2024-02-03: qty 8

## 2024-02-03 MED ORDER — FUROSEMIDE 10 MG/ML IJ SOLN
80.0000 mg | Freq: Once | INTRAMUSCULAR | Status: AC
  Administered 2024-02-03: 80 mg via INTRAVENOUS

## 2024-02-03 MED ORDER — POTASSIUM CHLORIDE CRYS ER 20 MEQ PO TBCR
EXTENDED_RELEASE_TABLET | ORAL | Status: AC
  Filled 2024-02-03: qty 2

## 2024-02-03 MED ORDER — POTASSIUM CHLORIDE CRYS ER 20 MEQ PO TBCR
40.0000 meq | EXTENDED_RELEASE_TABLET | Freq: Once | ORAL | Status: AC
  Administered 2024-02-03: 40 meq via ORAL

## 2024-02-03 NOTE — Progress Notes (Unsigned)
 Advanced [facility] Note    PCP: Alyn Babe, MD (last seen 01/25) Primary Cardiologist: Burney Carter, MD (last seen 04/25; returns 10/25) HF provider: Verneda Golder, MD (last seen 05/24)  Chief Complaint: shortness of breath   HPI:  Oscar [name] is a 48 y/o male with a history of obstructive sleep apnea (currently with CPAP), MI, HTN, VT (successful shock X 3), CKD, COPD, PAF, gout, hyperlipidemia, GERD, IDDM, SVT,asthma, chronic LBBB and chronic heart failure with CRT-D (2016).  Stress test 03/01/2019: Abnormal myocardial perfusion scan no clear evidence of reversible ischemia inferior persistent defect either scar versus body habitus artifact severely dilated left ventricular function severely depressed LV function with ejection fraction of around 11%. Recommend medical therapy   Echo 03/01/2019: EF of 15% along with mild Oscar.  Echo 02/27/20: EF of <20% Echo 11/23/20: EF of <20% along with moderate LVH and moderately elevated PA pressure. Echo 10/20/22: EF <20% along with mildly elevated PA pressure and mild Oscar.   Was in the ED 04/27/23 due to groin abscess X 1 month with drainage. CT done with f/u to be done with surgeon.   Admitted 06/12/23 due to ICD firing after sudden onset of SOB, chest tightness and dizziness. He was able to stand up and make it to the bathroom when he felt his ICD firing again. Afterwards, his shortness of breath, chest pain and dizziness resolved. He called EMS and on route to the ED, he was told that his ICD fired again. Successful conversion after the 3rd shock. Cardiology consulted. Needed amiodarone  drip with transition to or amiodarone .   Was in the ED 11/07/23 with right elbow pain. Has a history of gout. Was given colchicine  but has to wait 3 days before starting because he had been taking it daily for several weeks.  Admitted 12/01/23 with HF/ COPD exacerbation. IV diuresed. Cardiology consulted. IV solu-medrol  given with nebulizer,  antibiotics. Given prednisone  taper at discharge. Troponin elevated due to demand ischemia. Echo 12/01/23: EF 30-35% with normal RV, mild Oscar  Was in the ED 12/03/23 with an episode of chest pain when getting in the shower. Also had lightheadedness and shortness of breath. Trponin 80 & then 130. EKG without acute ischemia. SOB improved and he was released.   Admitted 12/04/23 with worsening shortness of breath. Found to be + covid. Given IV lasix  for a/c heart failure. Given IV steroids with transition to oral prednisone  for COPD exacerbation. Had episode of chest pain, improved after giving bronchodilator. Troponin trending down not consistent with ACS.   Seen in [facility] 04/25 and had torsemide  decreased to 20mg  daily after getting BMET back.   Seen in [facility] yesterday and sent for IV lasix . Amlodipine  and losartan  were both stopped due to hypotension.   He presents today for a HF follow-up visit after receiving IV lasix  yesterday for HF exacerbation. Continues to have shortness of breath (improving). Has associated chest heaviness although improved. Slept well last night.  Denies palpitations, abdominal distention, pedal edema or dizziness. Did not bring meds and is unclear if he stopped the meds from yesterday but then says he hasn't been taking losartan  "for awhile". He says that he definitely feels better then yesterday although he feels like he still has fluid on board.    ROS: All systems negative except as listed in HPI, PMH and Problem List.  SH:  Social History   Socioeconomic History   Marital status: Single    Spouse name: Not on file   Number  of children: 0   Years of education: Not on file   Highest education level: Some college, no degree  Occupational History   Occupation: unemployed  Tobacco Use   Smoking status: Former    Current packs/day: 0.00    Average packs/day: 1.5 packs/day for 34.0 years (51.0 ttl pk-yrs)    Types: Cigarettes    Quit date: 05/2023    Years since quitting:  0.7    Passive exposure: Past   Smokeless tobacco: Never   Tobacco comments:    Quit smoking 05/2023  Vaping Use   Vaping status: Never Used  Substance and Sexual Activity   Alcohol use: No   Drug use: No   Sexual activity: Yes  Other Topics Concern   Not on file  Social History Narrative   Live alone   Social Drivers of Health   Financial Resource Strain: Medium Risk (12/02/2023)   Overall Financial Resource Strain (CARDIA)    Difficulty of Paying Living Expenses: Somewhat hard  Food Insecurity: No Food Insecurity (12/04/2023)   Hunger Vital Sign    Worried About Running Out of Food in the Last Year: Never true    Ran Out of Food in the Last Year: Never true  Recent Concern: Food Insecurity - Food Insecurity Present (11/23/2023)   Received from [facility]   Hunger Vital Sign    Worried About Running Out of Food in the Last Year: Sometimes true    Ran Out of Food in the Last Year: Sometimes true  Transportation Needs: No Transportation Needs (12/04/2023)   PRAPARE - Administrator, Civil Service (Medical): No    Lack of Transportation (Non-Medical): No  Physical Activity: Not on file  Stress: Not on file  Social Connections: Moderately Isolated (12/04/2023)   Social Connection and Isolation Panel [NHANES]    Frequency of Communication with Friends and Family: More than three times a week    Frequency of Social Gatherings with Friends and Family: Once a week    Attends Religious Services: More than 4 times per year    Active Member of [employer] or Organizations: No    Attends Banker Meetings: Never    Marital Status: Never married  Intimate Partner Violence: Not At Risk (12/04/2023)   Humiliation, Afraid, Rape, and Kick questionnaire    Fear of Current or Ex-Partner: No    Emotionally Abused: No    Physically Abused: No    Sexually Abused: No    FH:  Family History  Problem Relation Age of Onset   Hypertension Mother    Congestive  Heart Failure Mother    Hypertension Sister    Diabetes Sister    Pancreatitis Sister    COPD Sister    Emphysema Sister        smoked   Pancreatitis Brother    Anemia Neg Hx    Arrhythmia Neg Hx    Asthma Neg Hx    Clotting disorder Neg Hx    Fainting Neg Hx    Heart attack Neg Hx    Heart disease Neg Hx    Heart failure Neg Hx    Hyperlipidemia Neg Hx     Past Medical History:  Diagnosis Date   AICD (automatic cardioverter/defibrillator) present    Aortic atherosclerosis (HCC)    Asthma    Chronic respiratory failure (HCC)    CKD (chronic kidney disease), stage III (HCC)    COPD (chronic obstructive pulmonary disease) (HCC)  Deafness in right ear    Diabetes mellitus without complication (HCC)    Dyspnea    GERD (gastroesophageal reflux disease)    Gout    HFrEF (heart failure with reduced ejection fraction) (HCC)    a.) TTE 12/10/14: EF 25%; diff inf HK; sev LV and mod LA dil; LVH. b.) TTE 06/23/18: EF 20-25%; LVH; mild LV dil, mild BAE. c.) TTE 03/01/19: EF 15%, LVH, BAE; triv PR/TR. d.) TTE 02/27/20: EF < 20%; sev LV dil; mild Oscar. e.) TTE 11/23/20: EF < 20%; glob HK; sev LV dil; LVH; mild-mod BAE; mod-sev TR, triv AR; G1DD. f.) TTE 05/14/21: EF <15%; LVH; sev LA and mild RV enlar; triv AR/PR, mild TR, mod Oscar.   Hiatal hernia    History of cardiac catheterization    a.) R/LHC 12/14/2009: normal coronaries. b.) R/LHC 12/11/2014: normal coronaries.   Hyperlipidemia    Hypertension    Hypoxemia    LBBB (left bundle branch block)    NICM (nonischemic cardiomyopathy; dilated cardiomyopathy) (HCC)    a.) R/LHC 12/14/2009: normal cors; LVEDP 18 mmHg, mean PA 29 mmHg, mean PCWP 31 mmHg; CO 6 L/min; CI 2.54 L/min/m. b.) TTE 12/10/2014: EF 25%. c.) R/LHC 12/11/2014: mean RA 9 mmHg, mean PA 22 mmHg, mean PCWP 22 mmHg. d.) TTE 06/23/2018: EF 20-25%. e.) TTE 03/01/2019: EF 15%. f.) TTE 02/27/2020: < 20%. g.) TTE 11/23/2020: EF <20%. h.) TTE 05/14/2021: < 15%.   NSTEMI (non-ST elevated  myocardial infarction) (HCC)    a.) x 3 per patient report ---> 2012, 2014, 2016   Obesity    On supplemental oxygen by nasal cannula    a.) 2-3 L/[place] PRN   OSA on CPAP    PAF (paroxysmal atrial fibrillation) (HCC)    a.) CHA2DS2-VASc = 4 (HFrEF, HTN, prior MI, T2DM). b.) rate/rhythm maintained without pharmacologial intervention; no current anticoagulation.   Pancreatitis    PSVT (paroxysmal supraventricular tachycardia) (HCC)     Current Outpatient Medications  Medication Sig Dispense Refill   albuterol  (VENTOLIN  HFA) 108 (90 Base) MCG/ACT inhaler Inhale 2 puffs into the lungs every 6 (six) hours as needed for wheezing or shortness of breath. 8 g 2   allopurinol  (ZYLOPRIM ) 100 MG tablet Take 100 mg by mouth daily.     amiodarone  (PACERONE ) 200 MG tablet Take 1 tablet (200 mg total) by mouth 2 (two) times daily. (Patient taking differently: Take 200 mg by mouth daily.) 60 tablet 0   atorvastatin  (LIPITOR ) 40 MG tablet Take 40 mg by mouth daily.     colchicine  0.6 MG tablet Take 0.6 mg by mouth daily.     dapagliflozin  propanediol (FARXIGA ) 10 MG TABS tablet Take 1 tablet (10 mg total) by mouth daily. 30 tablet 2   doxycycline  (VIBRAMYCIN ) 100 MG capsule Take 1 capsule (100 mg total) by mouth every 12 (twelve) hours. 60 capsule 0   Fluticasone -Umeclidin-Vilant (TRELEGY ELLIPTA ) 100-62.5-25 MCG/ACT AEPB Inhale 1 puff into the lungs daily. 60 each 11   metoprolol  succinate (TOPROL -XL) 25 MG 24 hr tablet Take 1 tablet (25 mg total) by mouth daily. Take with or immediately following a meal. 30 tablet 11   midodrine  (PROAMATINE ) 5 MG tablet Take 1 tablet (5 mg total) by mouth 3 (three) times daily as needed. If bp top number is lower than 100 90 tablet 5   NON FORMULARY Pt uses a cpap nightly     pantoprazole  (PROTONIX ) 40 MG tablet Take 1 tablet (40 mg total) by mouth daily.  torsemide  (DEMADEX ) 20 MG tablet Take 1 tablet (20 mg total) by mouth daily.     No current facility-administered  medications for this visit.   Vitals:   02/04/24 0906  BP: 112/83  Pulse: 81  SpO2: 98%  Weight: (!) 312 lb (141.5 kg)   Wt Readings from Last 3 Encounters:  02/04/24 (!) 312 lb (141.5 kg)  02/03/24 (!) 317 lb (143.8 kg)  01/26/24 (!) 308 lb 4.8 oz (139.8 kg)   Lab Results  Component Value Date   CREATININE 1.78 (H) 02/03/2024   CREATININE 1.70 (H) 12/16/2023   CREATININE 1.93 (H) 12/08/2023    PHYSICAL EXAM:  General: Well appearing. No resp difficulty HEENT: normal Neck: supple, no JVD Cor: Regular rhythm, rate. No rubs, gallops or murmurs Lungs: clear Abdomen: soft, nontender, nondistended. Extremities: no cyanosis, clubbing, rash, edema Neuro: alert & oriented X 3. Moves all 4 extremities w/o difficulty. Affect pleasant   ECG: not done  ReDs: Low quality   ASSESSMENT & PLAN:  1: Chronic heart failure with reduced ejection fraction- - NYHA class III - still fluid up with symptoms although improving - weighing daily; Reminded to call for an overnight weight gain of >2 pounds or a weekly weight gain of >5 pounds.  - weight down 5 pounds from last visit here yesterday - received 80mg  IV lasix / 40meq PO potassium yesterday - emphasized decreasing fluid intake to 2L (currently drinking 3L) - Stress test 03/01/2019:   Abnormal myocardial perfusion scan no clear evidence of reversible ischemia inferior persistent defect either scar versus body habitus artifact severely dilated left ventricular function severely depressed LV function with ejection fraction of around 11%.   - Echo 03/01/2019: EF of 15% along with mild Oscar.  - Echo 02/27/20: EF of <20% - Echo 11/23/20: EF of <20% along with moderate LVH and moderately elevated PA pressure. - Echo 10/20/22: EF <20% along with mildly elevated PA pressure and mild Oscar.  - Echo 12/01/23: EF 30-35% with normal RV, mild Oscar - AICD (2016) in place but denies any shocks "in a long time" - increase torsemide  to 40mg  daily X 3 days then  resume 20mg  daily with extra 20mg  PRN; we discussed furoscix  but he declined this - continue metoprolol  succinate 25mg  daily  - continue farxiga  10mg  daily  - losartan  stopped 02/03/24; wrote on AVS to make sure this was stopped - in the past, spironolactone  has caused hypotension - saw HF provider Oscar [name]) 05/24 - BNP 02/03/24 was 316.2 - BMET, BNP today  2: HTN with CKD- - BP 112/83 - amlodipine  stopped 02/03/24 - begin midodrine  5mg  TID if SBP <100 - sees PCP (Oscar [name]) at [facility]  - BMP 02/03/24 reviewed: sodium 134, potassium 4.0, Cr 1.78 and GFR 46 - BMET today - saw nephrology Oscar [name]) 05/25  3: Obstructive sleep apnea- - wears nocturnal ventilator QHS, ~ 6-8 hours/night; reports sleeping well - continues wearing oxygen at 3L at bedtime & PRN during the day - saw pulmonology Oscar [name]) 04/25  4: Diabetes- - A1c 12/03/23 was 8.5% - continue atorvastatin  40mg  daily - LDL 09/30/22 was 168; recheck lipid panel today  5: Atrial fibrillation- - saw cardiology Oscar [name]) 04/25 - currently taking amiodarone  200mg  daily  - does not want to resume apixaban ; aware of stroke risk  6: VT- - magnesium  06/13/23 was 2.0  - Mg 12/06/23 reviewed and was 2.3   Keep already scheduled appt later this month, sooner return if needed.   Charlette Console, FNP 02/03/24

## 2024-02-03 NOTE — Telephone Encounter (Signed)
 Called to confirm/remind patient of their appointment at the [facility] on 02/04/24.   Appointment:   [x] Confirmed  [] Left mess   [] No answer/No voice mail  [] VM Full/unable to leave message  [] Phone not in service  Patient reminded to bring all medications and/or complete list.  Confirmed patient has transportation. Gave directions, instructed to utilize valet parking.

## 2024-02-03 NOTE — Progress Notes (Signed)
 Oscar [name]

## 2024-02-03 NOTE — Progress Notes (Signed)
 Cardiac Individual Treatment Plan  Patient Details  Name: Oscar [name] MRN: [id number] Date of Birth: [birth year] Referring Provider:   Flowsheet Row Cardiac Rehab from 01/26/2024 in [facility] Cardiac and Pulmonary Rehab  Referring Provider Burney Carter, MD       Initial Encounter Date:  Flowsheet Row Cardiac Rehab from 01/26/2024 in [facility] Cardiac and Pulmonary Rehab  Date 01/26/24       Visit Diagnosis: Chronic systolic heart failure (HCC)  Patient's Home Medications on Admission:  Current Outpatient Medications:    albuterol  (VENTOLIN  HFA) 108 (90 Base) MCG/ACT inhaler, Inhale 2 puffs into the lungs every 6 (six) hours as needed for wheezing or shortness of breath., Disp: 8 g, Rfl: 2   allopurinol  (ZYLOPRIM ) 100 MG tablet, Take 100 mg by mouth daily., Disp: , Rfl:    amiodarone  (PACERONE ) 200 MG tablet, Take 1 tablet (200 mg total) by mouth 2 (two) times daily. (Patient taking differently: Take 200 mg by mouth daily.), Disp: 60 tablet, Rfl: 0   amLODipine  (NORVASC ) 5 MG tablet, Take 5 mg by mouth daily., Disp: , Rfl:    atorvastatin  (LIPITOR ) 40 MG tablet, Take 40 mg by mouth daily., Disp: , Rfl:    colchicine  0.6 MG tablet, Take 0.6 mg by mouth daily., Disp: , Rfl:    dapagliflozin  propanediol (FARXIGA ) 10 MG TABS tablet, Take 1 tablet (10 mg total) by mouth daily., Disp: 30 tablet, Rfl: 2   doxycycline  (VIBRAMYCIN ) 100 MG capsule, Take 1 capsule (100 mg total) by mouth every 12 (twelve) hours., Disp: 60 capsule, Rfl: 0   Fluticasone -Umeclidin-Vilant (TRELEGY ELLIPTA ) 100-62.5-25 MCG/ACT AEPB, Inhale 1 puff into the lungs daily., Disp: 60 each, Rfl: 11   losartan  (COZAAR ) 25 MG tablet, Take 1 tablet (25 mg total) by mouth daily., Disp: 30 tablet, Rfl: 11   metoprolol  succinate (TOPROL -XL) 25 MG 24 hr tablet, Take 1 tablet (25 mg total) by mouth daily. Take with or immediately following a meal., Disp: 30 tablet, Rfl: 11   NON FORMULARY, Pt uses a cpap nightly, Disp: , Rfl:     pantoprazole  (PROTONIX ) 40 MG tablet, Take 1 tablet (40 mg total) by mouth daily., Disp: , Rfl:    torsemide  (DEMADEX ) 20 MG tablet, Take 1 tablet (20 mg total) by mouth daily., Disp: , Rfl:   Past Medical History: Past Medical History:  Diagnosis Date   AICD (automatic cardioverter/defibrillator) present    Aortic atherosclerosis (HCC)    Asthma    Chronic respiratory failure (HCC)    CKD (chronic kidney disease), stage III (HCC)    COPD (chronic obstructive pulmonary disease) (HCC)    Deafness in right ear    Diabetes mellitus without complication (HCC)    Dyspnea    GERD (gastroesophageal reflux disease)    Gout    HFrEF (heart failure with reduced ejection fraction) (HCC)    a.) TTE 12/10/14: EF 25%; diff inf HK; sev LV and mod LA dil; LVH. b.) TTE 06/23/18: EF 20-25%; LVH; mild LV dil, mild BAE. c.) TTE 03/01/19: EF 15%, LVH, BAE; triv PR/TR. d.) TTE 02/27/20: EF < 20%; sev LV dil; mild MR. e.) TTE 11/23/20: EF < 20%; glob HK; sev LV dil; LVH; mild-mod BAE; mod-sev TR, triv AR; G1DD. f.) TTE 05/14/21: EF <15%; LVH; sev LA and mild RV enlar; triv AR/PR, mild TR, mod MR.   Hiatal hernia    History of cardiac catheterization    a.) R/LHC 12/14/2009: normal coronaries. b.) R/LHC 12/11/2014: normal coronaries.  Hyperlipidemia    Hypertension    Hypoxemia    LBBB (left bundle branch block)    NICM (nonischemic cardiomyopathy; dilated cardiomyopathy) (HCC)    a.) R/LHC 12/14/2009: normal cors; LVEDP 18 mmHg, mean PA 29 mmHg, mean PCWP 31 mmHg; CO 6 L/min; CI 2.54 L/min/m. b.) TTE 12/10/2014: EF 25%. c.) R/LHC 12/11/2014: mean RA 9 mmHg, mean PA 22 mmHg, mean PCWP 22 mmHg. d.) TTE 06/23/2018: EF 20-25%. e.) TTE 03/01/2019: EF 15%. f.) TTE 02/27/2020: < 20%. g.) TTE 11/23/2020: EF <20%. h.) TTE 05/14/2021: < 15%.   NSTEMI (non-ST elevated myocardial infarction) (HCC)    a.) x 3 per patient report ---> 2012, 2014, 2016   Obesity    On supplemental oxygen by nasal cannula    a.) 2-3 L/[place] PRN    OSA on CPAP    PAF (paroxysmal atrial fibrillation) (HCC)    a.) CHA2DS2-VASc = 4 (HFrEF, HTN, prior MI, T2DM). b.) rate/rhythm maintained without pharmacologial intervention; no current anticoagulation.   Pancreatitis    PSVT (paroxysmal supraventricular tachycardia) (HCC)     Tobacco Use: Social History   Tobacco Use  Smoking Status Former   Current packs/day: 0.00   Average packs/day: 1.5 packs/day for 34.0 years (51.0 ttl pk-yrs)   Types: Cigarettes   Quit date: 05/2023   Years since quitting: 0.7   Passive exposure: Past  Smokeless Tobacco Never  Tobacco Comments   Quit smoking 05/2023    Labs: Review Flowsheet  More data exists      Latest Ref Rng & Units 12/25/2021 09/30/2022 06/13/2023 12/03/2023 12/04/2023  Labs for ITP Cardiac and Pulmonary Rehab  Cholestrol 0 - 200 mg/dL - 161  - - -  LDL (calc) 0 - 99 mg/dL - 096  - - -  HDL-C >04 mg/dL - 35  - - -  Trlycerides <150 mg/dL - 98  - - -  Hemoglobin A1c 4.8 - 5.6 % 6.8  - 7.3  8.5  -  Bicarbonate 20.0 - 28.0 mmol/L - - - - 29.2   O2 Saturation % - - - - 89.3      Exercise Target Goals: Exercise Program Goal: Individual exercise prescription set using results from initial 6 min walk test and THRR while considering  patient's activity barriers and safety.   Exercise Prescription Goal: Initial exercise prescription builds to 30-45 minutes a day of aerobic activity, 2-3 days per week.  Home exercise guidelines will be given to patient during program as part of exercise prescription that the participant will acknowledge.   Education: Aerobic Exercise: - Group verbal and visual presentation on the components of exercise prescription. Introduces F.I.T.T principle from ACSM for exercise prescriptions.  Reviews F.I.T.T. principles of aerobic exercise including progression. Written material given at graduation. Flowsheet Row Cardiac Rehab from 01/26/2024 in [facility] Cardiac and Pulmonary Rehab  Education need identified  01/26/24       Education: Resistance Exercise: - Group verbal and visual presentation on the components of exercise prescription. Introduces F.I.T.T principle from ACSM for exercise prescriptions  Reviews F.I.T.T. principles of resistance exercise including progression. Written material given at graduation. Flowsheet Row Cardiac Rehab from 01/26/2024 in [facility] Cardiac and Pulmonary Rehab  Education need identified 01/26/24        Education: Exercise & Equipment Safety: - Individual verbal instruction and demonstration of equipment use and safety with use of the equipment. Flowsheet Row Cardiac Rehab from 01/26/2024 in [facility] Cardiac and Pulmonary Rehab  Date 01/26/24  Educator MB  Instruction Review Code 1- Verbalizes Understanding       Education: Exercise Physiology & General Exercise Guidelines: - Group verbal and written instruction with models to review the exercise physiology of the cardiovascular system and associated critical values. Provides general exercise guidelines with specific guidelines to those with heart or lung disease.  Flowsheet Row Cardiac Rehab from 01/26/2024 in [facility] Cardiac and Pulmonary Rehab  Education need identified 01/26/24       Education: Flexibility, Balance, Mind/Body Relaxation: - Group verbal and visual presentation with interactive activity on the components of exercise prescription. Introduces F.I.T.T principle from ACSM for exercise prescriptions. Reviews F.I.T.T. principles of flexibility and balance exercise training including progression. Also discusses the mind body connection.  Reviews various relaxation techniques to help reduce and manage stress (i.e. Deep breathing, progressive muscle relaxation, and visualization). Balance handout provided to take home. Written material given at graduation.   Activity Barriers & Risk Stratification:  Activity Barriers & Cardiac Risk Stratification - 01/26/24 1028       Activity Barriers & Cardiac Risk  Stratification   Activity Barriers Joint Problems;Other (comment)    Comments Elbow joint, gout in both feet    Cardiac Risk Stratification High             6 Minute Walk:  6 Minute Walk     Row Name 01/26/24 1027         6 Minute Walk   Phase Initial     Distance 800 feet     Walk Time 6 minutes     # of Rest Breaks 0     MPH 1.52     METS 2.06     RPE 11     Perceived Dyspnea  1     VO2 Peak 7.21     Symptoms No     Resting HR 60 bpm     Resting BP 104/70     Resting Oxygen Saturation  98 %     Exercise Oxygen Saturation  during 6 min walk 97 %     Max Ex. HR 100 bpm     Max Ex. BP 118/80     2 Minute Post BP 100/70              Oxygen Initial Assessment:  Oxygen Initial Assessment - 01/20/24 1430       Home Oxygen   Home Oxygen Device Home Concentrator;E-Tanks    Sleep Oxygen Prescription Continuous;CPAP    Liters per minute 3    Home Exercise Oxygen Prescription Continuous    Liters per minute 3   when winded, 2 l when not winded   Home Resting Oxygen Prescription Continuous    Liters per minute 2    Compliance with Home Oxygen Use Yes      Intervention   Short Term Goals To learn and exhibit compliance with exercise, home and travel O2 prescription;To learn and understand importance of monitoring SPO2 with pulse oximeter and demonstrate accurate use of the pulse oximeter.;To learn and understand importance of maintaining oxygen saturations>88%;To learn and demonstrate proper pursed lip breathing techniques or other breathing techniques. ;To learn and demonstrate proper use of respiratory medications    Long  Term Goals Exhibits compliance with exercise, home  and travel O2 prescription;Verbalizes importance of monitoring SPO2 with pulse oximeter and return demonstration;Maintenance of O2 saturations>88%;Exhibits proper breathing techniques, such as pursed lip breathing or other method taught during program session;Compliance with respiratory  medication;Demonstrates proper use of MDI's  Oxygen Re-Evaluation:  Oxygen Re-Evaluation     Row Name 02/01/24 1014             Home Oxygen   Home Oxygen Device Home Concentrator;E-Tanks       Sleep Oxygen Prescription Continuous;CPAP       Liters per minute 3       Home Exercise Oxygen Prescription Continuous       Liters per minute 3       Home Resting Oxygen Prescription Continuous       Liters per minute 2       Compliance with Home Oxygen Use Yes         Goals/Expected Outcomes   Short Term Goals To learn and exhibit compliance with exercise, home and travel O2 prescription;To learn and understand importance of monitoring SPO2 with pulse oximeter and demonstrate accurate use of the pulse oximeter.;To learn and understand importance of maintaining oxygen saturations>88%;To learn and demonstrate proper pursed lip breathing techniques or other breathing techniques. ;To learn and demonstrate proper use of respiratory medications       Long  Term Goals Exhibits compliance with exercise, home  and travel O2 prescription;Verbalizes importance of monitoring SPO2 with pulse oximeter and return demonstration;Maintenance of O2 saturations>88%;Exhibits proper breathing techniques, such as pursed lip breathing or other method taught during program session;Compliance with respiratory medication;Demonstrates proper use of MDI's       Comments Reviewed RPE and dyspnea scale, THR and program prescription with pt today.  Pt voiced understanding and was given a copy of goals to take home.       Goals/Expected Outcomes Short: Use RPE daily to regulate intensity. Long: Follow program prescription in THR.                Oxygen Discharge (Final Oxygen Re-Evaluation):  Oxygen Re-Evaluation - 02/01/24 1014       Home Oxygen   Home Oxygen Device Home Concentrator;E-Tanks    Sleep Oxygen Prescription Continuous;CPAP    Liters per minute 3    Home Exercise Oxygen Prescription  Continuous    Liters per minute 3    Home Resting Oxygen Prescription Continuous    Liters per minute 2    Compliance with Home Oxygen Use Yes      Goals/Expected Outcomes   Short Term Goals To learn and exhibit compliance with exercise, home and travel O2 prescription;To learn and understand importance of monitoring SPO2 with pulse oximeter and demonstrate accurate use of the pulse oximeter.;To learn and understand importance of maintaining oxygen saturations>88%;To learn and demonstrate proper pursed lip breathing techniques or other breathing techniques. ;To learn and demonstrate proper use of respiratory medications    Long  Term Goals Exhibits compliance with exercise, home  and travel O2 prescription;Verbalizes importance of monitoring SPO2 with pulse oximeter and return demonstration;Maintenance of O2 saturations>88%;Exhibits proper breathing techniques, such as pursed lip breathing or other method taught during program session;Compliance with respiratory medication;Demonstrates proper use of MDI's    Comments Reviewed RPE and dyspnea scale, THR and program prescription with pt today.  Pt voiced understanding and was given a copy of goals to take home.    Goals/Expected Outcomes Short: Use RPE daily to regulate intensity. Long: Follow program prescription in THR.             Initial Exercise Prescription:  Initial Exercise Prescription - 01/26/24 1000       Date of Initial Exercise RX and Referring Provider   Date 01/26/24  Referring Provider Burney Carter, MD      Oxygen   Oxygen Continuous    Liters 2-3L    Maintain Oxygen Saturation 88% or higher      Treadmill   MPH 1.4    Grade 0    Minutes 15    METs 2.07      Recumbant Bike   Level 2    RPM 50    Watts 25    Minutes 15    METs 2.06      NuStep   Level 2    SPM 80    Minutes 15    METs 2.06      T5 Nustep   Level 2    SPM 80    Minutes 15    METs 2.06      Biostep-RELP   Level 2    SPM 50     Minutes 15    METs 2.06      Prescription Details   Frequency (times per week) 2    Duration Progress to 30 minutes of continuous aerobic without signs/symptoms of physical distress      Intensity   THRR 40-80% of Max Heartrate 104-149    Ratings of Perceived Exertion 11-13    Perceived Dyspnea 0-4      Progression   Progression Continue to progress workloads to maintain intensity without signs/symptoms of physical distress.      Resistance Training   Training Prescription Yes    Weight 3 lb, 5 lb   3 lb (R), 5 lb (L)   Reps 10-15             Perform Capillary Blood Glucose checks as needed.  Exercise Prescription Changes:   Exercise Prescription Changes     Row Name 01/26/24 1000             Response to Exercise   Blood Pressure (Admit) 104/70       Blood Pressure (Exercise) 118/80       Blood Pressure (Exit) 100/70       Heart Rate (Admit) 60 bpm       Heart Rate (Exercise) 100 bpm       Heart Rate (Exit) 60 bpm       Oxygen Saturation (Admit) 98 %       Oxygen Saturation (Exercise) 97 %       Oxygen Saturation (Exit) 97 %       Rating of Perceived Exertion (Exercise) 11       Perceived Dyspnea (Exercise) 1       Symptoms none       Comments results       Intensity THRR New         Progression   Average METs 2.06                Exercise Comments:   Exercise Comments     Row Name 02/01/24 1012           Exercise Comments First full day of exercise!  Patient was oriented to gym and equipment including functions, settings, policies, and procedures.  Patient's individual exercise prescription and treatment plan were reviewed.  All starting workloads were established based on the results of the 6 minute walk test done at initial orientation visit.  The plan for exercise progression was also introduced and progression will be customized based on patient's performance and goals.  Exercise Goals and Review:   Exercise  Goals     Row Name 01/26/24 1036             Exercise Goals   Increase Physical Activity Yes       Intervention Provide advice, education, support and counseling about physical activity/exercise needs.;Develop an individualized exercise prescription for aerobic and resistive training based on initial evaluation findings, risk stratification, comorbidities and participant's personal goals.       Expected Outcomes Short Term: Attend rehab on a regular basis to increase amount of physical activity.;Long Term: Add in home exercise to make exercise part of routine and to increase amount of physical activity.;Long Term: Exercising regularly at least 3-5 days a week.       Increase Strength and Stamina Yes       Intervention Provide advice, education, support and counseling about physical activity/exercise needs.;Develop an individualized exercise prescription for aerobic and resistive training based on initial evaluation findings, risk stratification, comorbidities and participant's personal goals.       Expected Outcomes Short Term: Increase workloads from initial exercise prescription for resistance, speed, and METs.;Short Term: Perform resistance training exercises routinely during rehab and add in resistance training at home;Long Term: Improve cardiorespiratory fitness, muscular endurance and strength as measured by increased METs and functional capacity ( )       Able to understand and use rate of perceived exertion (RPE) scale Yes       Intervention Provide education and explanation on how to use RPE scale       Expected Outcomes Short Term: Able to use RPE daily in rehab to express subjective intensity level;Long Term:  Able to use RPE to guide intensity level when exercising independently       Able to understand and use Dyspnea scale Yes       Intervention Provide education and explanation on how to use Dyspnea scale       Expected Outcomes Short Term: Able to use Dyspnea scale daily in  rehab to express subjective sense of shortness of breath during exertion;Long Term: Able to use Dyspnea scale to guide intensity level when exercising independently       Knowledge and understanding of Target Heart Rate Range (THRR) Yes       Intervention Provide education and explanation of THRR including how the numbers were predicted and where they are located for reference       Expected Outcomes Short Term: Able to state/look up THRR;Short Term: Able to use daily as guideline for intensity in rehab;Long Term: Able to use THRR to govern intensity when exercising independently       Able to check pulse independently Yes       Intervention Provide education and demonstration on how to check pulse in carotid and radial arteries.;Review the importance of being able to check your own pulse for safety during independent exercise       Expected Outcomes Short Term: Able to explain why pulse checking is important during independent exercise;Long Term: Able to check pulse independently and accurately       Understanding of Exercise Prescription Yes       Intervention Provide education, explanation, and written materials on patient's individual exercise prescription       Expected Outcomes Short Term: Able to explain program exercise prescription;Long Term: Able to explain home exercise prescription to exercise independently                Exercise Goals Re-Evaluation :  Discharge Exercise Prescription (Final Exercise Prescription Changes):  Exercise Prescription Changes - 01/26/24 1000       Response to Exercise   Blood Pressure (Admit) 104/70    Blood Pressure (Exercise) 118/80    Blood Pressure (Exit) 100/70    Heart Rate (Admit) 60 bpm    Heart Rate (Exercise) 100 bpm    Heart Rate (Exit) 60 bpm    Oxygen Saturation (Admit) 98 %    Oxygen Saturation (Exercise) 97 %    Oxygen Saturation (Exit) 97 %    Rating of Perceived Exertion (Exercise) 11    Perceived Dyspnea (Exercise) 1     Symptoms none    Comments results    Intensity THRR New      Progression   Average METs 2.06             Nutrition:  Target Goals: Understanding of nutrition guidelines, daily intake of sodium 1500mg , cholesterol 200mg , calories 30% from fat and 7% or less from saturated fats, daily to have 5 or more servings of fruits and vegetables.  Education: All About Nutrition: -Group instruction provided by verbal, written material, interactive activities, discussions, models, and posters to present general guidelines for heart healthy nutrition including fat, fiber, MyPlate, the role of sodium in heart healthy nutrition, utilization of the nutrition label, and utilization of this knowledge for meal planning. Follow up email sent as well. Written material given at graduation. Flowsheet Row Cardiac Rehab from 01/26/2024 in [facility] Cardiac and Pulmonary Rehab  Education need identified 01/26/24       Biometrics:  Pre Biometrics - 01/26/24 1036       Pre Biometrics   Height 5' 9.1" (1.755 m)    Weight 308 lb 4.8 oz (139.8 kg)    Waist Circumference 58 inches    Hip Circumference 55 inches    Waist to Hip Ratio 1.05 %    BMI (Calculated) 45.4    Single Leg Stand 7.9 seconds              Nutrition Therapy Plan and Nutrition Goals:  Nutrition Therapy & Goals - 02/01/24 1340       Nutrition Therapy   Diet Carb controlled, Cardiac, Low Na    Protein (specify units) 90g    Fiber 30 grams    Whole Grain Foods 3 servings    Saturated Fats 15 max. grams    Fruits and Vegetables 5 servings/day    Sodium 2 grams      Personal Nutrition Goals   Nutrition Goal Cut out sugary beverages, drink 32-48oz water daily    Personal Goal #2 Eat 15-30gProtein and 30-60gCarbs at each meal.    Personal Goal #3 Read labels and reduce sodium intake to below 2300mg . Ideally 1500mg  per day.    Comments Patient drinking mostly sugary beverages like juice and soda. Says he has tried to move away  from soda, reports he was drinking 4-6L of soda a day. Explained to him the negative impact sugary beverages have on his health, blood sugar and weight. Set goal to switch over and drink more water, with short term goal of ~32-48oz of water daily with 64-80oz as a long term goal. Educated on carb portions of ~30-60g per meal, the importance of eating throughout the day and choosing high quality carbs rather than junk food and sugary beverages. Provided him with Mediterranean diet handout. Educated on types of fats, sources, and how to read labels. He reports he adds salt to  his foods, has been told to limit and look for better alternatives like misses dash. Confirmed and encouraged him to not salt food. Reinforced focus on keeping carb portions controlled at meals and limiting sodium.      Intervention Plan   Intervention Prescribe, educate and counsel regarding individualized specific dietary modifications aiming towards targeted core components such as weight, hypertension, lipid management, diabetes, heart failure and other comorbidities.;Nutrition handout(s) given to patient.    Expected Outcomes Short Term Goal: Understand basic principles of dietary content, such as calories, fat, sodium, cholesterol and nutrients.;Short Term Goal: A plan has been developed with personal nutrition goals set during dietitian appointment.;Long Term Goal: Adherence to prescribed nutrition plan.             Nutrition Assessments:  MEDIFICTS Score Key: >=70 Need to make dietary changes  40-70 Heart Healthy Diet <= 40 Therapeutic Level Cholesterol Diet  Flowsheet Row Cardiac Rehab from 02/01/2024 in [facility] Cardiac and Pulmonary Rehab  Picture Your Plate Total Score on Admission 60      Picture Your Plate Scores: <45 Unhealthy dietary pattern with much room for improvement. 41-50 Dietary pattern unlikely to meet recommendations for good health and room for improvement. 51-60 More healthful dietary pattern, with  some room for improvement.  >60 Healthy dietary pattern, although there may be some specific behaviors that could be improved.    Nutrition Goals Re-Evaluation:   Nutrition Goals Discharge (Final Nutrition Goals Re-Evaluation):   Psychosocial: Target Goals: Acknowledge presence or absence of significant depression and/or stress, maximize coping skills, provide positive support system. Participant is able to verbalize types and ability to use techniques and skills needed for reducing stress and depression.   Education: Stress, Anxiety, and Depression - Group verbal and visual presentation to define topics covered.  Reviews how body is impacted by stress, anxiety, and depression.  Also discusses healthy ways to reduce stress and to treat/manage anxiety and depression.  Written material given at graduation.   Education: Sleep Hygiene -Provides group verbal and written instruction about how sleep can affect your health.  Define sleep hygiene, discuss sleep cycles and impact of sleep habits. Review good sleep hygiene tips.    Initial Review & Psychosocial Screening:  Initial Psych Review & Screening - 01/20/24 1432       Initial Review   Current issues with None Identified      Family Dynamics   Good Support System? Yes   siblings,     Barriers   Psychosocial barriers to participate in program There are no identifiable barriers or psychosocial needs.      Screening Interventions   Interventions Encouraged to exercise;To provide support and resources with identified psychosocial needs;Provide feedback about the scores to participant    Expected Outcomes Short Term goal: Utilizing psychosocial counselor, staff and physician to assist with identification of specific Stressors or current issues interfering with healing process. Setting desired goal for each stressor or current issue identified.;Long Term Goal: Stressors or current issues are controlled or eliminated.;Short Term goal:  Identification and review with participant of any Quality of Life or Depression concerns found by scoring the questionnaire.;Long Term goal: The participant improves quality of Life and PHQ9 Scores as seen by post scores and/or verbalization of changes             Quality of Life Scores:   Quality of Life - 02/01/24 1704       Quality of Life   Select Quality of Life  Quality of Life Scores   Health/Function Pre 18.32 %    Socioeconomic Pre 27.5 %    Psych/Spiritual Pre 30 %    Family Pre 30 %    GLOBAL Pre 23.2 %            Scores of 19 and below usually indicate a poorer quality of life in these areas.  A difference of  2-3 points is a clinically meaningful difference.  A difference of 2-3 points in the total score of the Quality of Life Index has been associated with significant improvement in overall quality of life, self-image, physical symptoms, and general health in studies assessing change in quality of life.  PHQ-9: Review Flowsheet  More data exists      01/26/2024 10/17/2021 06/15/2018 05/18/2018 03/05/2018  Depression screen PHQ 2/9  Decreased Interest 1 0 0 0 0  Down, Depressed, Hopeless 0 0 0 0 1  PHQ - 2 Score 1 0 0 0 1  Altered sleeping 3 - - - -  Tired, decreased energy 1 - - - -  Change in appetite 1 - - - -  Feeling bad or failure about yourself  0 - - - -  Trouble concentrating 0 - - - -  Moving slowly or fidgety/restless 1 - - - -  Suicidal thoughts 0 - - - -  PHQ-9 Score 7 - - - -  Difficult doing work/chores Somewhat difficult - - - -   Interpretation of Total Score  Total Score Depression Severity:  1-4 = Minimal depression, 5-9 = Mild depression, 10-14 = Moderate depression, 15-19 = Moderately severe depression, 20-27 = Severe depression   Psychosocial Evaluation and Intervention:  Psychosocial Evaluation - 01/20/24 1447       Psychosocial Evaluation & Interventions   Comments Zaevion has no barriers to attending the program. He lives  alone. He is ready to start the program to work on exercise and learning more about amanging his heart failure.    Expected Outcomes STG attend all scheduled sessions, work on exercise progression as tolerated. Attend education sessions LTG COntinues with exercise progression, utilizes nutrition and education information to continue managing his health    Continue Psychosocial Services  Follow up required by staff             Psychosocial Re-Evaluation:   Psychosocial Discharge (Final Psychosocial Re-Evaluation):   Vocational Rehabilitation: Provide vocational rehab assistance to qualifying candidates.   Vocational Rehab Evaluation & Intervention:   Education: Education Goals: Education classes will be provided on a variety of topics geared toward better understanding of heart health and risk factor modification. Participant will state understanding/return demonstration of topics presented as noted by education test scores.  Learning Barriers/Preferences:   General Cardiac Education Topics:  AED/CPR: - Group verbal and written instruction with the use of models to demonstrate the basic use of the AED with the basic ABC's of resuscitation.   Anatomy and Cardiac Procedures: - Group verbal and visual presentation and models provide information about basic cardiac anatomy and function. Reviews the testing methods done to diagnose heart disease and the outcomes of the test results. Describes the treatment choices: Medical Management, Angioplasty, or Coronary Bypass Surgery for treating various heart conditions including Myocardial Infarction, Angina, Valve Disease, and Cardiac Arrhythmias.  Written material given at graduation. Flowsheet Row Cardiac Rehab from 01/26/2024 in [facility] Cardiac and Pulmonary Rehab  Education need identified 01/26/24       Medication Safety: - Group verbal and visual instruction  to review commonly prescribed medications for heart and lung disease. Reviews  the medication, class of the drug, and side effects. Includes the steps to properly store meds and maintain the prescription regimen.  Written material given at graduation.   Intimacy: - Group verbal instruction through game format to discuss how heart and lung disease can affect sexual intimacy. Written material given at graduation..   Know Your Numbers and Heart Failure: - Group verbal and visual instruction to discuss disease risk factors for cardiac and pulmonary disease and treatment options.  Reviews associated critical values for Overweight/Obesity, Hypertension, Cholesterol, and Diabetes.  Discusses basics of heart failure: signs/symptoms and treatments.  Introduces Heart Failure Zone chart for action plan for heart failure.  Written material given at graduation. Flowsheet Row Cardiac Rehab from 01/26/2024 in [facility] Cardiac and Pulmonary Rehab  Education need identified 01/26/24       Infection Prevention: - Provides verbal and written material to individual with discussion of infection control including proper hand washing and proper equipment cleaning during exercise session. Flowsheet Row Cardiac Rehab from 01/26/2024 in [facility] Cardiac and Pulmonary Rehab  Date 01/26/24  Educator MB  Instruction Review Code 1- Verbalizes Understanding       Falls Prevention: - Provides verbal and written material to individual with discussion of falls prevention and safety. Flowsheet Row Cardiac Rehab from 01/26/2024 in [facility] Cardiac and Pulmonary Rehab  Date 01/26/24  Educator MB  Instruction Review Code 1- Verbalizes Understanding       Other: -Provides group and verbal instruction on various topics (see comments)   Knowledge Questionnaire Score:  Knowledge Questionnaire Score - 01/26/24 1038       Knowledge Questionnaire Score   Pre Score 20/26             Core Components/Risk Factors/Patient Goals at Admission:  Personal Goals and Risk Factors at Admission - 01/26/24 1038        Core Components/Risk Factors/Patient Goals on Admission    Weight Management Yes;Obesity;Weight Loss    Intervention Weight Management: Develop a combined nutrition and exercise program designed to reach desired caloric intake, while maintaining appropriate intake of nutrient and fiber, sodium and fats, and appropriate energy expenditure required for the weight goal.;Weight Management: Provide education and appropriate resources to help participant work on and attain dietary goals.;Weight Management/Obesity: Establish reasonable short term and long term weight goals.;Obesity: Provide education and appropriate resources to help participant work on and attain dietary goals.    Admit Weight 308 lb 4.8 oz (139.8 kg)    Goal Weight: Short Term 274 lb (124.3 kg)    Goal Weight: Long Term 240 lb (108.9 kg)    Expected Outcomes Short Term: Continue to assess and modify interventions until short term weight is achieved;Long Term: Adherence to nutrition and physical activity/exercise program aimed toward attainment of established weight goal;Weight Loss: Understanding of general recommendations for a balanced deficit meal plan, which promotes 1-2 lb weight loss per week and includes a negative energy balance of [phone] kcal/d;Understanding recommendations for meals to include 15-35% energy as protein, 25-35% energy from fat, 35-60% energy from carbohydrates, less than 200mg  of dietary cholesterol, 20-35 gm of total fiber daily;Understanding of distribution of calorie intake throughout the day with the consumption of 4-5 meals/snacks    Diabetes Yes    Intervention Provide education about signs/symptoms and action to take for hypo/hyperglycemia.;Provide education about proper nutrition, including hydration, and aerobic/resistive exercise prescription along with prescribed medications to achieve blood glucose in normal ranges:  Fasting glucose 65-99 mg/dL    Expected Outcomes Short Term: Participant verbalizes  understanding of the signs/symptoms and immediate care of hyper/hypoglycemia, proper foot care and importance of medication, aerobic/resistive exercise and nutrition plan for blood glucose control.;Long Term: Attainment of HbA1C < 7%.    Heart Failure Yes    Intervention Provide a combined exercise and nutrition program that is supplemented with education, support and counseling about heart failure. Directed toward relieving symptoms such as shortness of breath, decreased exercise tolerance, and extremity edema.    Expected Outcomes Improve functional capacity of life;Short term: Attendance in program 2-3 days a week with increased exercise capacity. Reported lower sodium intake. Reported increased fruit and vegetable intake. Reports medication compliance.;Short term: Daily weights obtained and reported for increase. Utilizing diuretic protocols set by physician.;Long term: Adoption of self-care skills and reduction of barriers for early signs and symptoms recognition and intervention leading to self-care maintenance.    Hypertension Yes    Intervention Provide education on lifestyle modifcations including regular physical activity/exercise, weight management, moderate sodium restriction and increased consumption of fresh fruit, vegetables, and low fat dairy, alcohol moderation, and smoking cessation.;Monitor prescription use compliance.    Expected Outcomes Short Term: Continued assessment and intervention until BP is < 140/35mm HG in hypertensive participants. < 130/43mm HG in hypertensive participants with diabetes, heart failure or chronic kidney disease.;Long Term: Maintenance of blood pressure at goal levels.    Lipids Yes    Intervention Provide education and support for participant on nutrition & aerobic/resistive exercise along with prescribed medications to achieve LDL 70mg , HDL >40mg .    Expected Outcomes Short Term: Participant states understanding of desired cholesterol values and is compliant  with medications prescribed. Participant is following exercise prescription and nutrition guidelines.;Long Term: Cholesterol controlled with medications as prescribed, with individualized exercise RX and with personalized nutrition plan. Value goals: LDL < 70mg , HDL > 40 mg.             Education:Diabetes - Individual verbal and written instruction to review signs/symptoms of diabetes, desired ranges of glucose level fasting, after meals and with exercise. Acknowledge that pre and post exercise glucose checks will be done for 3 sessions at entry of program.   Core Components/Risk Factors/Patient Goals Review:    Core Components/Risk Factors/Patient Goals at Discharge (Final Review):    ITP Comments:  ITP Comments     Row Name 01/20/24 1443 01/26/24 1027 02/01/24 1012 02/03/24 1041     ITP Comments Virtual orientation call completed today. he has an appointment on Date: 01/26/2024  for EP eval and gym Orientation.  Documentation of diagnosis [facility] be found in [facility] 12/16/2023 . Completed and gym orientation for cardiac rehab. Initial ITP created and sent for review to Dr. Firman Hughes, Medical Director. First full day of exercise!  Patient was oriented to gym and equipment including functions, settings, policies, and procedures.  Patient's individual exercise prescription and treatment plan were reviewed.  All starting workloads were established based on the results of the 6 minute walk test done at initial orientation visit.  The plan for exercise progression was also introduced and progression will be customized based on patient's performance and goals. 30 Day review completed. Medical Director ITP review done, changes made as directed, and signed approval by Medical Director.    new to program             Comments:

## 2024-02-03 NOTE — Progress Notes (Signed)
 Advanced [facility] Failure Clinic Note    PCP: Alyn Babe, MD (last seen 01/25) Primary Cardiologist: Burney Carter, MD (last seen 04/25; returns 10/25) HF provider: Verneda Golder, MD (last seen 05/24)  Chief Complaint: shortness of breath   HPI:  Mr Matsuura is a 48 y/o male with a history of obstructive sleep apnea (currently with CPAP), MI, HTN, VT (successful shock X 3), CKD, COPD, PAF, gout, hyperlipidemia, GERD, IDDM, SVT,asthma, chronic LBBB and chronic heart failure with CRT-D (2016).  Stress test 03/01/2019: Abnormal myocardial perfusion scan no clear evidence of reversible ischemia inferior persistent defect either scar versus body habitus artifact severely dilated left ventricular function severely depressed LV function with ejection fraction of around 11%. Recommend medical therapy   Echo 03/01/2019: EF of 15% along with mild MR.  Echo 02/27/20: EF of <20% Echo 11/23/20: EF of <20% along with moderate LVH and moderately elevated PA pressure. Echo 10/20/22: EF <20% along with mildly elevated PA pressure and mild MR.   Was in the ED 04/27/23 due to groin abscess X 1 month with drainage. CT done with f/u to be done with surgeon.   Admitted 06/12/23 due to ICD firing after sudden onset of SOB, chest tightness and dizziness. He was able to stand up and make it to the bathroom when he felt his ICD firing again. Afterwards, his shortness of breath, chest pain and dizziness resolved. He called EMS and on route to the ED, he was told that his ICD fired again. Successful conversion after the 3rd shock. Cardiology consulted. Needed amiodarone  drip with transition to or amiodarone .   Was in the ED 11/07/23 with right elbow pain. Has a history of gout. Was given colchicine  but has to wait 3 days before starting because he had been taking it daily for several weeks.  Admitted 12/01/23 with HF/ COPD exacerbation. IV diuresed. Cardiology consulted. IV solu-medrol  given with nebulizer,  antibiotics. Given prednisone  taper at discharge. Troponin elevated due to demand ischemia. Echo 12/01/23: EF 30-35% with normal RV, mild MR  Was in the ED 12/03/23 with an episode of chest pain when getting in the shower. Also had lightheadedness and shortness of breath. Trponin 80 & then 130. EKG without acute ischemia. SOB improved and he was released.   Admitted 12/04/23 with worsening shortness of breath. Found to be + covid. Given IV lasix  for a/c heart failure. Given IV steroids with transition to oral prednisone  for COPD exacerbation. Had episode of chest pain, improved after giving bronchodilator. Troponin trending down not consistent with ACS.   Seen in [facility] 04/25 and had torsemide  decreased to 20mg  daily after getting BMET back.   He presents today for an acute visit with a chief complaint of shortness of breath. Worsening over the last couple of days. Has associated weight gain of 9 pounds in 2 days, fatigue, chest heaviness, abdominal distention, cough, blurry vision. Denies pedal edema. Participating in cardiac rehab. Not adding salt but says that he's drinking ~ 3L of fluids daily because his mouth stays so dry. He is aware that he's drinking too much fluid. Has not taken any of his medications today and says that his BP at cardiac rehab "was good"  ROS: All systems negative except as listed in HPI, PMH and Problem List.  SH:  Social History   Socioeconomic History   Marital status: Single    Spouse name: Not on file   Number of children: 0   Years of education: Not on file   Highest  education level: Some college, no degree  Occupational History   Occupation: unemployed  Tobacco Use   Smoking status: Former    Current packs/day: 0.00    Average packs/day: 1.5 packs/day for 34.0 years (51.0 ttl pk-yrs)    Types: Cigarettes    Quit date: 05/2023    Years since quitting: 0.7    Passive exposure: Past   Smokeless tobacco: Never   Tobacco comments:    Quit smoking 05/2023  Vaping  Use   Vaping status: Never Used  Substance and Sexual Activity   Alcohol use: No   Drug use: No   Sexual activity: Yes  Other Topics Concern   Not on file  Social History Narrative   Live alone   Social Drivers of Health   Financial Resource Strain: Medium Risk (12/02/2023)   Overall Financial Resource Strain (CARDIA)    Difficulty of Paying Living Expenses: Somewhat hard  Food Insecurity: No Food Insecurity (12/04/2023)   Hunger Vital Sign    Worried About Running Out of Food in the Last Year: Never true    Ran Out of Food in the Last Year: Never true  Recent Concern: Food Insecurity - Food Insecurity Present (11/23/2023)   Received from [facility]   Hunger Vital Sign    Worried About Running Out of Food in the Last Year: Sometimes true    Ran Out of Food in the Last Year: Sometimes true  Transportation Needs: No Transportation Needs (12/04/2023)   PRAPARE - Administrator, Civil Service (Medical): No    Lack of Transportation (Non-Medical): No  Physical Activity: Not on file  Stress: Not on file  Social Connections: Moderately Isolated (12/04/2023)   Social Connection and Isolation Panel [NHANES]    Frequency of Communication with Friends and Family: More than three times a week    Frequency of Social Gatherings with Friends and Family: Once a week    Attends Religious Services: More than 4 times per year    Active Member of [employer] or Organizations: No    Attends Banker Meetings: Never    Marital Status: Never married  Intimate Partner Violence: Not At Risk (12/04/2023)   Humiliation, Afraid, Rape, and Kick questionnaire    Fear of Current or Ex-Partner: No    Emotionally Abused: No    Physically Abused: No    Sexually Abused: No    FH:  Family History  Problem Relation Age of Onset   Hypertension Mother    Congestive Heart Failure Mother    Hypertension Sister    Diabetes Sister    Pancreatitis Sister    COPD Sister     Emphysema Sister        smoked   Pancreatitis Brother    Anemia Neg Hx    Arrhythmia Neg Hx    Asthma Neg Hx    Clotting disorder Neg Hx    Fainting Neg Hx    Heart attack Neg Hx    Heart disease Neg Hx    Heart failure Neg Hx    Hyperlipidemia Neg Hx     Past Medical History:  Diagnosis Date   AICD (automatic cardioverter/defibrillator) present    Aortic atherosclerosis (HCC)    Asthma    Chronic respiratory failure (HCC)    CKD (chronic kidney disease), stage III (HCC)    COPD (chronic obstructive pulmonary disease) (HCC)    Deafness in right ear    Diabetes mellitus without complication (HCC)  Dyspnea    GERD (gastroesophageal reflux disease)    Gout    HFrEF (heart failure with reduced ejection fraction) (HCC)    a.) TTE 12/10/14: EF 25%; diff inf HK; sev LV and mod LA dil; LVH. b.) TTE 06/23/18: EF 20-25%; LVH; mild LV dil, mild BAE. c.) TTE 03/01/19: EF 15%, LVH, BAE; triv PR/TR. d.) TTE 02/27/20: EF < 20%; sev LV dil; mild MR. e.) TTE 11/23/20: EF < 20%; glob HK; sev LV dil; LVH; mild-mod BAE; mod-sev TR, triv AR; G1DD. f.) TTE 05/14/21: EF <15%; LVH; sev LA and mild RV enlar; triv AR/PR, mild TR, mod MR.   Hiatal hernia    History of cardiac catheterization    a.) R/LHC 12/14/2009: normal coronaries. b.) R/LHC 12/11/2014: normal coronaries.   Hyperlipidemia    Hypertension    Hypoxemia    LBBB (left bundle branch block)    NICM (nonischemic cardiomyopathy; dilated cardiomyopathy) (HCC)    a.) R/LHC 12/14/2009: normal cors; LVEDP 18 mmHg, mean PA 29 mmHg, mean PCWP 31 mmHg; CO 6 L/min; CI 2.54 L/min/m. b.) TTE 12/10/2014: EF 25%. c.) R/LHC 12/11/2014: mean RA 9 mmHg, mean PA 22 mmHg, mean PCWP 22 mmHg. d.) TTE 06/23/2018: EF 20-25%. e.) TTE 03/01/2019: EF 15%. f.) TTE 02/27/2020: < 20%. g.) TTE 11/23/2020: EF <20%. h.) TTE 05/14/2021: < 15%.   NSTEMI (non-ST elevated myocardial infarction) (HCC)    a.) x 3 per patient report ---> 2012, 2014, 2016   Obesity    On  supplemental oxygen by nasal cannula    a.) 2-3 L/[place] PRN   OSA on CPAP    PAF (paroxysmal atrial fibrillation) (HCC)    a.) CHA2DS2-VASc = 4 (HFrEF, HTN, prior MI, T2DM). b.) rate/rhythm maintained without pharmacologial intervention; no current anticoagulation.   Pancreatitis    PSVT (paroxysmal supraventricular tachycardia) (HCC)     Current Outpatient Medications  Medication Sig Dispense Refill   albuterol  (VENTOLIN  HFA) 108 (90 Base) MCG/ACT inhaler Inhale 2 puffs into the lungs every 6 (six) hours as needed for wheezing or shortness of breath. 8 g 2   allopurinol  (ZYLOPRIM ) 100 MG tablet Take 100 mg by mouth daily.     amiodarone  (PACERONE ) 200 MG tablet Take 1 tablet (200 mg total) by mouth 2 (two) times daily. (Patient taking differently: Take 200 mg by mouth daily.) 60 tablet 0   amLODipine  (NORVASC ) 5 MG tablet Take 5 mg by mouth daily.     atorvastatin  (LIPITOR ) 40 MG tablet Take 40 mg by mouth daily.     colchicine  0.6 MG tablet Take 0.6 mg by mouth daily.     dapagliflozin  propanediol (FARXIGA ) 10 MG TABS tablet Take 1 tablet (10 mg total) by mouth daily. 30 tablet 2   doxycycline  (VIBRAMYCIN ) 100 MG capsule Take 1 capsule (100 mg total) by mouth every 12 (twelve) hours. 60 capsule 0   Fluticasone -Umeclidin-Vilant (TRELEGY ELLIPTA ) 100-62.5-25 MCG/ACT AEPB Inhale 1 puff into the lungs daily. 60 each 11   losartan  (COZAAR ) 25 MG tablet Take 1 tablet (25 mg total) by mouth daily. 30 tablet 11   metoprolol  succinate (TOPROL -XL) 25 MG 24 hr tablet Take 1 tablet (25 mg total) by mouth daily. Take with or immediately following a meal. 30 tablet 11   NON FORMULARY Pt uses a cpap nightly     pantoprazole  (PROTONIX ) 40 MG tablet Take 1 tablet (40 mg total) by mouth daily.     torsemide  (DEMADEX ) 20 MG tablet Take 1 tablet (20 mg  total) by mouth daily.     No current facility-administered medications for this visit.   Vitals:   02/03/24 1014  BP: (!) 87/63  Pulse: 80  SpO2: 97%   Weight: (!) 317 lb (143.8 kg)   Wt Readings from Last 3 Encounters:  02/03/24 (!) 317 lb (143.8 kg)  01/26/24 (!) 308 lb 4.8 oz (139.8 kg)  01/15/24 (!) 310 lb (140.6 kg)   Lab Results  Component Value Date   CREATININE 1.78 (H) 02/03/2024   CREATININE 1.70 (H) 12/16/2023   CREATININE 1.93 (H) 12/08/2023    PHYSICAL EXAM:  General: Well appearing. No resp difficulty HEENT: normal Neck: supple, elevated JVD Cor: Regular rhythm, rate. No rubs, gallops or murmurs Lungs: clear Abdomen: soft, nontender, distended. Extremities: no cyanosis, clubbing, rash, edema Neuro: alert & oriented X 3. Moves all 4 extremities w/o difficulty. Affect pleasant  ECG: not done  Reds: Low quality   ASSESSMENT & PLAN:  1: Chronic heart failure with reduced ejection fraction- - NYHA class III - fluid up with weight gain and worsening symptoms - weighing daily; Reminded to call for an overnight weight gain of >2 pounds or a weekly weight gain of >5 pounds.  - weight up 7 pounds from last visit here 3 weeks ago; up 9 pounds in 2 days per home weight. - will send for 80mg  IV lasix / 40meq PO potassium; BMP/ BNP today - emphasized decreasing fluid intake to 2L (currently drinking 3L) - Stress test 03/01/2019:   Abnormal myocardial perfusion scan no clear evidence of reversible ischemia inferior persistent defect either scar versus body habitus artifact severely dilated left ventricular function severely depressed LV function with ejection fraction of around 11%.   - Echo 03/01/2019: EF of 15% along with mild MR.  - Echo 02/27/20: EF of <20% - Echo 11/23/20: EF of <20% along with moderate LVH and moderately elevated PA pressure. - Echo 10/20/22: EF <20% along with mildly elevated PA pressure and mild MR.  - Echo 12/01/23: EF 30-35% with normal RV, mild MR - AICD (2016) in place but denies any shocks "in a long time" - continue torsemide  20mg  daily & additional 20mg  PRN - continue metoprolol  succinate 25mg   daily  - stop losartan  d/t hypotension - continue farxiga  10mg  daily  - in the past, spironolactone  has caused hypotension - saw HF provider Corie Diamond) 05/24 - BNP 12/04/23 was 455.1 - high risk of admission due to fluid overload with hypotension  2: HTN with CKD- - BP 87/63 - stop amlodipine  - begin midodrine  5mg  TID if SBP <100 - sees PCP (Mancheno) at [facility]  - BMP 12/16/23 reviewed: sodium 137, potassium 4.1, Cr 1.7 and GFR 49 - BMET today - saw nephrology Milta Alosa) 05/25  3: Obstructive sleep apnea- - wears nocturnal ventilator QHS, ~ 6-8 hours/night; reports sleeping well - continues wearing oxygen at 3L at bedtime & PRN during the day - saw pulmonology Cleo Dace) 04/25  4: Diabetes- - A1c 12/03/23 was 8.5% - continue atorvastatin  40mg  daily - LDL 09/30/22 was 168; recheck next visit  5: Atrial fibrillation- - saw cardiology Beau Bound) 04/25 - currently taking amiodarone  200mg  daily  - does not want to resume apixaban ; aware of stroke risk  6: VT- - magnesium  06/13/23 was 2.0  - Mg 12/06/23 reviewed and was 2.3   Return tomorrow to reassess  Charlette Console, FNP 02/03/24

## 2024-02-03 NOTE — Progress Notes (Signed)
 Daily Session Note  Patient Details  Name: Oscar [name] MRN: [id number] Date of Birth: [birth year] Referring Provider:   Flowsheet Row Cardiac Rehab from 01/26/2024 in [facility] Cardiac [facility] Pulmonary [facility]  Referring Provider Burney Carter, MD       Encounter Date: 02/03/2024  Check In:  Session Check In - 02/03/24 0934       Check-In   Supervising physician immediately available to respond to emergencies See telemetry face sheet for immediately available ER MD    Location [facility]-Cardiac & Pulmonary Rehab    Staff Present Sherle Dire, BS, Exercise Physiologist;Taziyah Iannuzzi RN,BSN,MPA;Maxon Conetta BS, Exercise Physiologist;Joseph Lacinda Pica RCP,RRT,BSRT    Virtual Visit No    Medication changes reported     No    Fall or balance concerns reported    No    Warm-up and Cool-down Performed on first and last piece of equipment    Resistance Training Performed Yes    VAD Patient? No    PAD/SET Patient? No      Pain Assessment   Currently in Pain? No/denies                Social History   Tobacco Use  Smoking Status Former   Current packs/day: 0.00   Average packs/day: 1.5 packs/day for 34.0 years (51.0 ttl pk-yrs)   Types: Cigarettes   Quit date: 05/2023   Years since quitting: 0.7   Passive exposure: Past  Smokeless Tobacco Never  Tobacco Comments   Quit smoking 05/2023    Goals Met:  Independence with exercise equipment Exercise tolerated well No report of concerns or symptoms today  Goals Unmet:  Not Applicable  Comments: Pt able to follow exercise prescription today without complaint.  Will continue to monitor for progression.    Dr. Firman Hughes is Medical Director for [facility] Cardiac Rehabilitation.  Dr. Fuad Aleskerov is Medical Director for [facility] Pulmonary Rehabilitation.

## 2024-02-04 ENCOUNTER — Encounter: Payer: Self-pay | Admitting: Family

## 2024-02-04 ENCOUNTER — Ambulatory Visit: Payer: Self-pay | Admitting: Family

## 2024-02-04 ENCOUNTER — Ambulatory Visit (HOSPITAL_BASED_OUTPATIENT_CLINIC_OR_DEPARTMENT_OTHER): Admitting: Family

## 2024-02-04 ENCOUNTER — Encounter: Admitting: Family

## 2024-02-04 ENCOUNTER — Other Ambulatory Visit
Admission: RE | Admit: 2024-02-04 | Discharge: 2024-02-04 | Disposition: A | Discharge status: Home / Self Care | Source: Ambulatory Visit | Attending: Family | Admitting: Family

## 2024-02-04 VITALS — BP 112/83 | HR 81 | Wt 312.0 lb

## 2024-02-04 DIAGNOSIS — I472 Ventricular tachycardia, unspecified: Secondary | ICD-10-CM

## 2024-02-04 DIAGNOSIS — I480 Paroxysmal atrial fibrillation: Secondary | ICD-10-CM | POA: Insufficient documentation

## 2024-02-04 DIAGNOSIS — G4733 Obstructive sleep apnea (adult) (pediatric): Secondary | ICD-10-CM | POA: Insufficient documentation

## 2024-02-04 DIAGNOSIS — Z7984 Long term (current) use of oral hypoglycemic drugs: Secondary | ICD-10-CM | POA: Diagnosis not present

## 2024-02-04 DIAGNOSIS — Z95810 Presence of automatic (implantable) cardiac defibrillator: Secondary | ICD-10-CM | POA: Insufficient documentation

## 2024-02-04 DIAGNOSIS — I471 Supraventricular tachycardia, unspecified: Secondary | ICD-10-CM | POA: Insufficient documentation

## 2024-02-04 DIAGNOSIS — I447 Left bundle-branch block, unspecified: Secondary | ICD-10-CM | POA: Insufficient documentation

## 2024-02-04 DIAGNOSIS — Z87891 Personal history of nicotine dependence: Secondary | ICD-10-CM | POA: Diagnosis not present

## 2024-02-04 DIAGNOSIS — I5022 Chronic systolic (congestive) heart failure: Secondary | ICD-10-CM | POA: Insufficient documentation

## 2024-02-04 DIAGNOSIS — N183 Chronic kidney disease, stage 3 unspecified: Secondary | ICD-10-CM | POA: Insufficient documentation

## 2024-02-04 DIAGNOSIS — Z79899 Other long term (current) drug therapy: Secondary | ICD-10-CM | POA: Insufficient documentation

## 2024-02-04 DIAGNOSIS — Z794 Long term (current) use of insulin: Secondary | ICD-10-CM

## 2024-02-04 DIAGNOSIS — Z5986 Financial insecurity: Secondary | ICD-10-CM | POA: Insufficient documentation

## 2024-02-04 DIAGNOSIS — I10 Essential (primary) hypertension: Secondary | ICD-10-CM

## 2024-02-04 DIAGNOSIS — E1122 Type 2 diabetes mellitus with diabetic chronic kidney disease: Secondary | ICD-10-CM | POA: Insufficient documentation

## 2024-02-04 DIAGNOSIS — I252 Old myocardial infarction: Secondary | ICD-10-CM | POA: Insufficient documentation

## 2024-02-04 DIAGNOSIS — I420 Dilated cardiomyopathy: Secondary | ICD-10-CM | POA: Insufficient documentation

## 2024-02-04 DIAGNOSIS — E119 Type 2 diabetes mellitus without complications: Secondary | ICD-10-CM | POA: Diagnosis not present

## 2024-02-04 DIAGNOSIS — I130 Hypertensive heart and chronic kidney disease with heart failure and stage 1 through stage 4 chronic kidney disease, or unspecified chronic kidney disease: Secondary | ICD-10-CM | POA: Insufficient documentation

## 2024-02-04 DIAGNOSIS — E785 Hyperlipidemia, unspecified: Secondary | ICD-10-CM | POA: Diagnosis not present

## 2024-02-04 DIAGNOSIS — K219 Gastro-esophageal reflux disease without esophagitis: Secondary | ICD-10-CM | POA: Diagnosis not present

## 2024-02-04 LAB — BASIC METABOLIC PANEL WITH GFR
Anion gap: 8 (ref 5–15)
BUN: 53 mg/dL — ABNORMAL HIGH (ref 6–20)
CO2: 27 mmol/L (ref 22–32)
Calcium: 9.2 mg/dL (ref 8.9–10.3)
Chloride: 102 mmol/L (ref 98–111)
Creatinine, Ser: 1.77 mg/dL — ABNORMAL HIGH (ref 0.61–1.24)
GFR, Estimated: 47 mL/min — ABNORMAL LOW (ref >60)
Glucose, Bld: 213 mg/dL — ABNORMAL HIGH (ref 70–99)
Potassium: 4.5 mmol/L (ref 3.5–5.1)
Sodium: 137 mmol/L (ref 135–145)

## 2024-02-04 LAB — LIPID PANEL
Cholesterol: 256 mg/dL — ABNORMAL HIGH (ref 0–200)
HDL: 41 mg/dL (ref >40)
LDL Cholesterol: 194 mg/dL — ABNORMAL HIGH (ref 0–99)
Total CHOL/HDL Ratio: 6.2 ratio
Triglycerides: 103 mg/dL (ref <150)
VLDL: 21 mg/dL (ref 0–40)

## 2024-02-04 LAB — BRAIN NATRIURETIC PEPTIDE: B Natriuretic Peptide: 357.2 pg/mL — ABNORMAL HIGH (ref 0.0–100.0)

## 2024-02-04 MED ORDER — TORSEMIDE 40 MG PO TABS: 40.0000 mg | ORAL_TABLET | Freq: Every day | ORAL | 0 refills | Status: DC

## 2024-02-04 NOTE — Patient Instructions (Addendum)
 Medication Changes:  INCREASE TORSEMIDE  TO 40 MG ONCE DAILY FOR 3 DAYS.   Lab Work:  Go over to the [facility]ICAL [facility]ALL. Go pass the gift shop and have your blood work completed.  We will only call you if the results are abnormal or if the provider would like to make medication changes.   Special Instructions // Education:  Please do NOT take Amlodipine  or Losartan .   Follow-Up on: WEDNESDAY, JUNE 25TH AT 9:00 AM.  At the [facility], you and your health needs are our priority. We have a designated team specialized in the treatment of Heart Failure. This Care Team includes your primary Heart Failure Specialized Cardiologist (physician), Advanced Practice Providers (APPs- Physician Assistants and Nurse Practitioners), and Pharmacist who all work together to provide you with the care you need, when you need it.   You may see any of the following providers on your designated Care Team at your next follow up:  Dr. Jules Oar Dr. Peder Bourdon Dr. Alwin Baars Dr. Judyth Nunnery Shawnee Dellen, FNP Bevely Brush, RPH-CPP  Please be sure to bring in all your medications bottles to every appointment.   Need to Contact Us :  If you have any questions or concerns before your next appointment please send us  a message through [place] or call our office at [phone].    TO LEAVE A MESSAGE FOR THE NURSE SELECT OPTION 2, PLEASE LEAVE A MESSAGE INCLUDING: YOUR NAME DATE OF BIRTH CALL BACK NUMBER REASON FOR CALL**this is important as we prioritize the call backs  YOU WILL RECEIVE A CALL BACK THE SAME DAY AS LONG AS YOU CALL BEFORE 4:00 PM

## 2024-02-08 ENCOUNTER — Encounter

## 2024-02-10 ENCOUNTER — Encounter

## 2024-02-15 ENCOUNTER — Encounter

## 2024-02-17 ENCOUNTER — Ambulatory Visit (INDEPENDENT_AMBULATORY_CARE_PROVIDER_SITE_OTHER): Admitting: Surgery

## 2024-02-17 ENCOUNTER — Encounter: Payer: Self-pay | Admitting: Surgery

## 2024-02-17 ENCOUNTER — Encounter

## 2024-02-17 VITALS — BP 94/64 | HR 75 | Temp 97.9°F | Ht 65.0 in | Wt 303.6 lb

## 2024-02-17 DIAGNOSIS — L732 Hidradenitis suppurativa: Secondary | ICD-10-CM | POA: Diagnosis not present

## 2024-02-17 NOTE — Progress Notes (Signed)
 Outpatient Surgical Follow Up  02/17/2024  Oscar [name] is an 48 y.o. male.   Chief Complaint  Patient presents with   Follow-up    Groin abscess    HPI:  48 year old male well-known to me with multiple comorbidities including heart failure diabetes and morbid obesity now presents with Hidradenitis  He does have a history of hidradenitis and has had multiple rounds of antibiotics, went to dermatology .  Has tried steroids and immunotherapy. He did continues to have intermittent drainage from the right scrotum. . No fevers no chills.  No recent imaging.  Was seen by dermatologist and they talked about immunotherapy.  They also talked about general surgery and refer him to surgeon in [place] .  Unfortunately patient cannot drive over there.    Past Medical History:  Diagnosis Date   AICD (automatic cardioverter/defibrillator) present    Aortic atherosclerosis (HCC)    Asthma    Chronic respiratory failure (HCC)    CKD (chronic kidney disease), stage III (HCC)    COPD (chronic obstructive pulmonary disease) (HCC)    Deafness in right ear    Diabetes mellitus without complication (HCC)    Dyspnea    GERD (gastroesophageal reflux disease)    Gout    HFrEF (heart failure with reduced ejection fraction) (HCC)    a.) TTE 12/10/14: EF 25%; diff inf HK; sev LV and mod LA dil; LVH. b.) TTE 06/23/18: EF 20-25%; LVH; mild LV dil, mild BAE. c.) TTE 03/01/19: EF 15%, LVH, BAE; triv PR/TR. d.) TTE 02/27/20: EF < 20%; sev LV dil; mild MR. e.) TTE 11/23/20: EF < 20%; glob HK; sev LV dil; LVH; mild-mod BAE; mod-sev TR, triv AR; G1DD. f.) TTE 05/14/21: EF <15%; LVH; sev LA and mild RV enlar; triv AR/PR, mild TR, mod MR.   Hiatal hernia    History of cardiac catheterization    a.) R/LHC 12/14/2009: normal coronaries. b.) R/LHC 12/11/2014: normal coronaries.   Hyperlipidemia    Hypertension    Hypoxemia    LBBB (left bundle branch block)    NICM (nonischemic cardiomyopathy; dilated  cardiomyopathy) (HCC)    a.) R/LHC 12/14/2009: normal cors; LVEDP 18 mmHg, mean PA 29 mmHg, mean PCWP 31 mmHg; CO 6 L/min; CI 2.54 L/min/m. b.) TTE 12/10/2014: EF 25%. c.) R/LHC 12/11/2014: mean RA 9 mmHg, mean PA 22 mmHg, mean PCWP 22 mmHg. d.) TTE 06/23/2018: EF 20-25%. e.) TTE 03/01/2019: EF 15%. f.) TTE 02/27/2020: < 20%. g.) TTE 11/23/2020: EF <20%. h.) TTE 05/14/2021: < 15%.   NSTEMI (non-ST elevated myocardial infarction) (HCC)    a.) x 3 per patient report ---> 2012, 2014, 2016   Obesity    On supplemental oxygen by nasal cannula    a.) 2-3 L/Gordon PRN   OSA on CPAP    PAF (paroxysmal atrial fibrillation) (HCC)    a.) CHA2DS2-VASc = 4 (HFrEF, HTN, prior MI, T2DM). b.) rate/rhythm maintained without pharmacologial intervention; no current anticoagulation.   Pancreatitis    PSVT (paroxysmal supraventricular tachycardia) [facility])     Past Surgical History:  Procedure Laterality Date   BIOPSY  11/25/2021   Procedure: BIOPSY;  Surgeon: Brice Campi Albino Alu., MD;  Location: [facility];  Service: Gastroenterology;;   CARDIAC CATHETERIZATION  12/11/2014   Procedure: RIGHT/LEFT HEART CATH AND CORONARY ANGIOGRAPHY;  Surgeon: Avanell Leigh, MD;  Location: [facility] CATH LAB;  Service: Cardiovascular;;   COLONOSCOPY WITH PROPOFOL  N/A 11/13/2021   Procedure: COLONOSCOPY WITH PROPOFOL ;  Surgeon: Selena Daily, MD;  Location: [facility] ENDOSCOPY;  Service: Gastroenterology;  Laterality: N/A;   ESOPHAGOGASTRODUODENOSCOPY (EGD) WITH PROPOFOL  N/A 11/25/2021   Procedure: ESOPHAGOGASTRODUODENOSCOPY (EGD) WITH PROPOFOL ;  Surgeon: Brice Campi Albino Alu., MD;  Location: [facility] ENDOSCOPY;  Service: Gastroenterology;  Laterality: N/A;   ICD LEAD REMOVAL N/A 03/30/2015   Procedure: ICD LEAD REMOVAL;  Surgeon: Jerrie Morales, MD;  Location: [facility] ORS;  Service: Cardiovascular;  Laterality: N/A;   IMPLANTABLE CARDIOVERTER DEFIBRILLATOR IMPLANT     INCISION AND DRAINAGE ABSCESS N/A 12/12/2020   Procedure: INCISION  AND DRAINAGE ABSCESS;  Surgeon: Alben Alma, MD;  Location: [facility] ORS;  Service: General;  Laterality: N/A;   INSERT / REPLACE / REMOVE PACEMAKER     LEFT HEART CATHETERIZATION WITH CORONARY ANGIOGRAM N/A 12/09/2014   Procedure: LEFT HEART CATHETERIZATION WITH CORONARY ANGIOGRAM;  Surgeon: Odie Benne, MD;  Location: [facility] CATH LAB;  Service: Cardiovascular;  Laterality: N/A;   RIGHT/LEFT HEART CATH AND CORONARY ANGIOGRAPHY Bilateral 12/14/2009   Procedure: RIGHT/LEFT HEART CATH AND CORONARY ANGIOGRAPHY; Location: [facility]; Surgeon: Thais Fill, MD   TONSILLECTOMY     as a child , adnoids removed   UPPER ESOPHAGEAL ENDOSCOPIC ULTRASOUND (EUS) N/A 11/25/2021   Procedure: UPPER ESOPHAGEAL ENDOSCOPIC ULTRASOUND (EUS);  Surgeon: Normie Becton., MD;  Location: [facility];  Service: Gastroenterology;  Laterality: N/A;    Family History  Problem Relation Age of Onset   Hypertension Mother    Congestive Heart Failure Mother    Hypertension Sister    Diabetes Sister    Pancreatitis Sister    COPD Sister    Emphysema Sister        smoked   Pancreatitis Brother    Anemia Neg Hx    Arrhythmia Neg Hx    Asthma Neg Hx    Clotting disorder Neg Hx    Fainting Neg Hx    Heart attack Neg Hx    Heart disease Neg Hx    Heart failure Neg Hx    Hyperlipidemia Neg Hx     Social History:  reports that he quit smoking about 9 months ago. His smoking use included cigarettes. He has a 51 pack-year smoking history. He has been exposed to tobacco smoke. He has never used smokeless tobacco. He reports that he does not drink alcohol and does not use drugs.  Allergies:  Allergies  Allergen Reactions   Ciprofloxacin  Swelling and Other (See Comments)    Migraine Headache    Iodinated Contrast Media     Other reaction(s): Vomiting Projectile vomiting   Isosorb Dinitrate-Hydralazine  Other (See Comments)    Migraine Headache    Medications reviewed.    ROS Full ROS performed  and is otherwise negative other than what is stated in HPI   BP 94/64   Pulse 75   Temp 97.9 F (36.6 C) (Oral)   Ht 5' 5 (1.651 m)   Wt (!) 303 lb 9.6 oz (137.7 kg)   SpO2 94%   BMI 50.52 kg/m   Physical Exam  Vitals reviewed.  Constitutional:      Appearance: Normal appearance. He is obese.  Abdominal:     General: There is no distension.     Palpations: There is no mass.     Tenderness: There is no abdominal tenderness. There is no rebound.     Hernia: No hernia is present.  Genitourinary:    Comments: right groin hidradenitis w/o abscess, minimal perineal hidradenitis. Musculoskeletal:        General: No swelling. Normal range  of motion.  Skin:    General: Skin is warm and dry.     Capillary Refill: Capillary refill takes less than 2 seconds.  Neurological:     General: No focal deficit present.     Mental Status: He is alert and oriented to person, place, and time.  Psychiatric:        Mood and Affect: Mood normal.        Behavior: Behavior normal.        Thought Content: Thought content normal.        Judgment: Judgment normal.     Assessment/Plan: 48 year old male with chronic inguinal and scrotal hidradenitis.  Discussed with the patient in detail about his disease process.  No evidence of an abscess.  I have made referral to dermatology.  Given his significant heart failure I do think that he is better treated at a tertiary institution where he has access to cardiac anesthesia and further monitoring.  More importantly this is a very tricky disease to treat and 1 institution treatment will make the most sense.  It we will make referral to general surgery at [facility].  I personally spent a total of 30 minutes in the care of the patient today including performing a medically appropriate exam/evaluation, counseling and educating, placing orders, referring and communicating with other health care professionals, documenting clinical information in the EHR, independently  interpreting and reviewing images studies and coordinating care.   Evelia Hipp, MD [facility] General Surgeon

## 2024-02-17 NOTE — Patient Instructions (Signed)
 Hidradenitis Suppurativa    Hidradenitis suppurativa is a long-term (chronic) skin disease. It is similar to a severe form of acne, but it affects areas of the body where acne would be unusual, especially areas of the body where skin rubs against skin and becomes moist. These include:  Underarms.  Groin.  Genital area.  Buttocks.  Upper thighs.  Breasts.  Hidradenitis suppurativa may start out as small lumps or pimples caused by blocked skin pores, sweat glands, or hair follicles. Pimples may develop into deep sores that break open (rupture) and drain pus. Over time, affected areas of skin may thicken and become scarred. This condition is rare and does not spread from person to person (non-contagious).  What are the causes?  The exact cause of this condition is not known. It may be related to:  Male and male hormones.  An overactive disease-fighting system (immune system). The immune system may over-react to blocked hair follicles or sweat glands and cause swelling and pus-filled sores.  What increases the risk?  You are more likely to develop this condition if you:  Are male.  Are 23-75 years old.  Have a family history of hidradenitis suppurativa.  Have a personal history of acne.  Are overweight.  Smoke.  Take the medicine lithium.  What are the signs or symptoms?  The first symptoms are usually painful bumps in the skin, similar to pimples. The condition may get worse over time (progress), or it may only cause mild symptoms. If the disease progresses, symptoms may include:  Skin bumps getting bigger and growing deeper into the skin.  Bumps rupturing and draining pus.  Itchy, infected skin.  Skin getting thicker and scarred.  Tunnels under the skin (fistulas) where pus drains from a bump.  Pain during daily activities, such as pain during walking if your groin area is affected.  Emotional problems, such as stress or depression. This condition may affect your appearance and your ability or willingness to wear  certain clothes or do certain activities.  How is this diagnosed?  This condition is diagnosed by a health care provider who specializes in skin conditions (dermatologist). You may be diagnosed based on:  Your symptoms and medical history.  A physical exam.  Testing a pus sample for infection.  Blood tests.  How is this treated?  Your treatment will depend on how severe your symptoms are. The same treatment will not work for everybody with this condition. You may need to try several treatments to find what works best for you. Treatment may include:  Cleaning and bandaging (dressing) your wounds as needed.  Lifestyle changes, such as new skin care routines.  Taking medicines, such as:  Antibiotics.  Acne medicines.  Medicines to reduce the activity of the immune system.  A diabetes medicine (metformin).  Birth control pills, for women.  Steroids to reduce swelling and pain.  Working with a mental health care provider, if you experience emotional distress due to this condition.  If you have severe symptoms that do not get better with medicine, you may need surgery. Surgery may involve:  Using a laser to clear the skin and remove hair follicles.  Opening and draining deep sores.  Removing the areas of skin that are diseased and scarred.  Follow these instructions at home:  Medicines    Take over-the-counter and prescription medicines only as told by your health care provider.  If you were prescribed antibiotics, take them as told by your health care provider. Do not  stop using the antibiotic even if your condition improves.  Skin care  If you have open wounds, cover them with a clean dressing as told by your health care provider. Keep wounds clean by washing them gently with soap and water when you bathe.  Do not shave the areas where you get hidradenitis suppurativa.  Wear loose-fitting clothes.  Try to avoid getting overheated or sweaty. If you get sweaty or wet, change into clean, dry clothes as soon as you can.  To  help relieve pain and itchiness, cover sore areas with a warm, clean washcloth (warm compress) for 5-10 minutes as often as needed.  Your healthcare provider may recommend an antiperspirant deodorant that may be gentle on your skin.  A daily antiseptic wash to cleanse affected areas may be suggested by your healthcare provider.  General instructions  Learn as much as you can about your disease so that you have an active role in your treatment. Work closely with your health care provider to find treatments that work for you.  If you are overweight, work with your health care provider to lose weight as recommended.  Do not use any products that contain nicotine or tobacco. These products include cigarettes, chewing tobacco, and vaping devices, such as e-cigarettes. If you need help quitting, ask your health care provider.  If you struggle with living with this condition, talk with your health care provider or work with a mental health care provider as recommended.  Keep all follow-up visits.  Where to find more information  Hidradenitis Suppurativa Foundation, Inc.: www.hs-foundation.org  American Academy of Dermatology: InfoExam.si  Contact a health care provider if:  You have a flare-up of hidradenitis suppurativa.  You have a fever or chills.  You have trouble controlling your symptoms at home.  You have trouble doing your daily activities because of your symptoms.  You have trouble dealing with emotional problems related to your condition.  Summary  Hidradenitis suppurativa is a long-term (chronic) skin disease. It is similar to a severe form of acne, but it affects areas of the body where acne would be unusual.  The first symptoms are usually painful bumps in the skin, similar to pimples. The condition may only cause mild symptoms, or it may get worse over time (progress).  If you have open wounds, cover them with a clean dressing as told by your health care provider. Keep wounds clean by washing them gently with  soap and water when you bathe.  Besides skin care, treatment may include medicines, laser treatment, and surgery.  This information is not intended to replace advice given to you by your health care provider. Make sure you discuss any questions you have with your health care provider.  Document Revised: 10/09/2021 Document Reviewed: 10/09/2021  Elsevier Patient Education  2024 [employer].

## 2024-02-19 ENCOUNTER — Telehealth: Payer: Self-pay

## 2024-02-19 NOTE — Telephone Encounter (Signed)
 Faxed referral to Dr. Netta Bar [facility]) at [phone].

## 2024-02-22 ENCOUNTER — Encounter

## 2024-02-23 ENCOUNTER — Telehealth: Payer: Self-pay | Admitting: Family

## 2024-02-23 NOTE — Telephone Encounter (Signed)
 Called to confirm/remind patient of their appointment at [facility] on 02/24/[facility].   Appointment:   [x] Confirmed  [] Left mess   [] No answer/No voice mail  [] VM Full/unable to leave message  [] Phone not in service  Patient reminded to bring all medications and/or complete list.  Confirmed patient has transportation. Gave directions, instructed to utilize valet parking.

## 2024-02-24 ENCOUNTER — Encounter: Payer: Self-pay | Admitting: Family

## 2024-02-24 ENCOUNTER — Ambulatory Visit: Admitting: Family

## 2024-02-24 ENCOUNTER — Encounter

## 2024-02-24 ENCOUNTER — Other Ambulatory Visit
Admission: RE | Admit: 2024-02-24 | Discharge: 2024-02-24 | Disposition: A | Discharge status: Home / Self Care | Source: Ambulatory Visit | Attending: Family | Admitting: Family

## 2024-02-24 ENCOUNTER — Telehealth: Payer: Self-pay

## 2024-02-24 VITALS — BP 99/74 | HR 76 | Wt 309.0 lb

## 2024-02-24 DIAGNOSIS — Z794 Long term (current) use of insulin: Secondary | ICD-10-CM | POA: Insufficient documentation

## 2024-02-24 DIAGNOSIS — Z79899 Other long term (current) drug therapy: Secondary | ICD-10-CM | POA: Insufficient documentation

## 2024-02-24 DIAGNOSIS — I252 Old myocardial infarction: Secondary | ICD-10-CM | POA: Insufficient documentation

## 2024-02-24 DIAGNOSIS — N183 Chronic kidney disease, stage 3 unspecified: Secondary | ICD-10-CM | POA: Insufficient documentation

## 2024-02-24 DIAGNOSIS — G4733 Obstructive sleep apnea (adult) (pediatric): Secondary | ICD-10-CM | POA: Diagnosis not present

## 2024-02-24 DIAGNOSIS — E119 Type 2 diabetes mellitus without complications: Secondary | ICD-10-CM | POA: Diagnosis not present

## 2024-02-24 DIAGNOSIS — K219 Gastro-esophageal reflux disease without esophagitis: Secondary | ICD-10-CM | POA: Insufficient documentation

## 2024-02-24 DIAGNOSIS — J441 Chronic obstructive pulmonary disease with (acute) exacerbation: Secondary | ICD-10-CM | POA: Insufficient documentation

## 2024-02-24 DIAGNOSIS — I5022 Chronic systolic (congestive) heart failure: Secondary | ICD-10-CM | POA: Insufficient documentation

## 2024-02-24 DIAGNOSIS — I480 Paroxysmal atrial fibrillation: Secondary | ICD-10-CM

## 2024-02-24 DIAGNOSIS — Z7984 Long term (current) use of oral hypoglycemic drugs: Secondary | ICD-10-CM | POA: Insufficient documentation

## 2024-02-24 DIAGNOSIS — I447 Left bundle-branch block, unspecified: Secondary | ICD-10-CM | POA: Insufficient documentation

## 2024-02-24 DIAGNOSIS — Z87891 Personal history of nicotine dependence: Secondary | ICD-10-CM | POA: Insufficient documentation

## 2024-02-24 DIAGNOSIS — E782 Mixed hyperlipidemia: Secondary | ICD-10-CM

## 2024-02-24 DIAGNOSIS — I10 Essential (primary) hypertension: Secondary | ICD-10-CM | POA: Diagnosis not present

## 2024-02-24 DIAGNOSIS — M109 Gout, unspecified: Secondary | ICD-10-CM | POA: Insufficient documentation

## 2024-02-24 DIAGNOSIS — E785 Hyperlipidemia, unspecified: Secondary | ICD-10-CM | POA: Insufficient documentation

## 2024-02-24 DIAGNOSIS — I130 Hypertensive heart and chronic kidney disease with heart failure and stage 1 through stage 4 chronic kidney disease, or unspecified chronic kidney disease: Secondary | ICD-10-CM | POA: Insufficient documentation

## 2024-02-24 DIAGNOSIS — I471 Supraventricular tachycardia, unspecified: Secondary | ICD-10-CM | POA: Insufficient documentation

## 2024-02-24 DIAGNOSIS — I472 Ventricular tachycardia, unspecified: Secondary | ICD-10-CM

## 2024-02-24 DIAGNOSIS — Z8616 Personal history of COVID-19: Secondary | ICD-10-CM | POA: Insufficient documentation

## 2024-02-24 DIAGNOSIS — E1122 Type 2 diabetes mellitus with diabetic chronic kidney disease: Secondary | ICD-10-CM | POA: Insufficient documentation

## 2024-02-24 LAB — BASIC METABOLIC PANEL WITH GFR
Anion gap: 11 (ref 5–15)
BUN: 51 mg/dL — ABNORMAL HIGH (ref 6–20)
CO2: 25 mmol/L (ref 22–32)
Calcium: 9.1 mg/dL (ref 8.9–10.3)
Chloride: 102 mmol/L (ref 98–111)
Creatinine, Ser: 1.83 mg/dL — ABNORMAL HIGH (ref 0.61–1.24)
GFR, Estimated: 45 mL/min — ABNORMAL LOW (ref >60)
Glucose, Bld: 223 mg/dL — ABNORMAL HIGH (ref 70–99)
Potassium: 4.1 mmol/L (ref 3.5–5.1)
Sodium: 138 mmol/L (ref 135–145)

## 2024-02-24 LAB — LIPID PANEL
Cholesterol: 212 mg/dL — ABNORMAL HIGH (ref 0–200)
HDL: 38 mg/dL — ABNORMAL LOW (ref >40)
LDL Cholesterol: 157 mg/dL — ABNORMAL HIGH (ref 0–99)
Total CHOL/HDL Ratio: 5.6 ratio
Triglycerides: 87 mg/dL (ref <150)
VLDL: 17 mg/dL (ref 0–40)

## 2024-02-24 LAB — TSH: TSH: 4.803 u[IU]/mL — ABNORMAL HIGH (ref 0.350–4.500)

## 2024-02-24 MED ORDER — AMIODARONE HCL 200 MG PO TABS
200.0000 mg | ORAL_TABLET | Freq: Every day | ORAL | Status: AC
Start: 2024-02-24 — End: ?

## 2024-02-24 NOTE — Progress Notes (Signed)
 Advanced [facility] Failure Clinic Note    PCP: Antone Bottoms, MD (last seen 01/25) Primary Cardiologist: Florencio Kava, MD / Elodie Ronal Maxwell, NP (last seen 06/25; returns 10/25) HF provider: Bulah Cornet, MD (last seen 05/24)  Chief Complaint: fatigue   HPI:  Oscar [name] is a 48 y/o male with a history of obstructive sleep apnea (currently with CPAP), MI, HTN, VT (successful shock X 3), CKD, COPD, PAF, gout, hyperlipidemia, GERD, IDDM, SVT,asthma, chronic LBBB and chronic heart failure with CRT-D (2016).  Stress test 03/01/2019: Abnormal myocardial perfusion scan no clear evidence of reversible ischemia inferior persistent defect either scar versus body habitus artifact severely dilated left ventricular function severely depressed LV function with ejection fraction of around 11%. Recommend medical therapy   Echo 03/01/2019: EF of 15% along with mild Oscar.  Echo 02/27/20: EF of <20% Echo 11/23/20: EF of <20% along with moderate LVH and moderately elevated PA pressure. Echo 10/20/22: EF <20% along with mildly elevated PA pressure and mild Oscar.   Was in the ED 04/27/23 due to groin abscess X 1 month with drainage. CT done with f/u to be done with surgeon.   Admitted 06/12/23 due to ICD firing after sudden onset of SOB, chest tightness and dizziness. He was able to stand up and make it to the bathroom when he felt his ICD firing again. Afterwards, his shortness of breath, chest pain and dizziness resolved. He called EMS and on route to the ED, he was told that his ICD fired again. Successful conversion after the 3rd shock. Cardiology consulted. Needed amiodarone  drip with transition to or amiodarone .   CPET done 10/09/23 revealed sub-maximal exercise test, so maximal cardiorespiratory capacity could not be determined. Moderate to severe functional impairment appreciated. There were signs of a mixed cardiopulmonary limitation to exercise.   Was in the ED 11/07/23 with right elbow pain. Has a  history of gout. Was given colchicine  but has to wait 3 days before starting because he had been taking it daily for several weeks.  Admitted 12/01/23 with HF/ COPD exacerbation. IV diuresed. Cardiology consulted. IV solu-medrol  given with nebulizer, antibiotics. Given prednisone  taper at discharge. Troponin elevated due to demand ischemia. Echo 12/01/23: EF 30-35% with normal RV, mild Oscar  Was in the ED 12/03/23 with an episode of chest pain when getting in the shower. Also had lightheadedness and shortness of breath. Trponin 80 & then 130. EKG without acute ischemia. SOB improved and he was released.   Admitted 12/04/23 with worsening shortness of breath. Found to be + covid. Given IV lasix  for a/c heart failure. Given IV steroids with transition to oral prednisone  for COPD exacerbation. Had episode of chest pain, improved after giving bronchodilator. Troponin trending down not consistent with ACS.   Seen in [facility] 04/25 and had torsemide  decreased to 20mg  daily after getting BMET back.   Seen in [facility] 06/25 and sent for IV lasix . Amlodipine  and losartan  were both stopped due to hypotension.   He presents today for a HF follow-up visit with a chief complaint of moderate fatigue. Sleeping well. Denies shortness of breath, chest pain, palpitations, abdominal distention, pedal edema, dizziness. At last visit, losartan  was stopped but patient says that he has continued to take this. Saw [facility] cardiology yesterday and referral has been made back to Dr. Debby. Last eye exam was ~ 3 months ago.   He says that he has continued to wear his NIV but that he's having difficulty getting consistent respiratory supplies. Last sleep  study 07/2016.   ROS: All systems negative except as listed in HPI, PMH and Problem List.  SH:  Social History   Socioeconomic History   Marital status: Single    Spouse name: Not on file   Number of children: 0   Years of education: Not on file   Highest education level: Some  college, no degree  Occupational History   Occupation: unemployed  Tobacco Use   Smoking status: Former    Current packs/day: 0.00    Average packs/day: 1.5 packs/day for 34.0 years (51.0 ttl pk-yrs)    Types: Cigarettes    Quit date: 05/2023    Years since quitting: 0.8    Passive exposure: Past   Smokeless tobacco: Never   Tobacco comments:    Quit smoking 05/2023  Vaping Use   Vaping status: Never Used  Substance and Sexual Activity   Alcohol use: No   Drug use: No   Sexual activity: Yes  Other Topics Concern   Not on file  Social History Narrative   Live alone   Social Drivers of Health   Financial Resource Strain: Medium Risk (12/02/2023)   Overall Financial Resource Strain (CARDIA)    Difficulty of Paying Living Expenses: Somewhat hard  Food Insecurity: No Food Insecurity (12/04/2023)   Hunger Vital Sign    Worried About Running Out of Food in the Last Year: Never true    Ran Out of Food in the Last Year: Never true  Recent Concern: Food Insecurity - Food Insecurity Present (11/23/2023)   Received from [facility]   Hunger Vital Sign    Within the past 12 months, you worried that your food would run out before you got the money to buy more.: Sometimes true    Within the past 12 months, the food you bought just didn't last and you didn't have money to get more.: Sometimes true  Transportation Needs: No Transportation Needs (12/04/2023)   PRAPARE - Administrator, Civil Service (Medical): No    Lack of Transportation (Non-Medical): No  Physical Activity: Not on file  Stress: Not on file  Social Connections: Moderately Isolated (12/04/2023)   Social Connection and Isolation Panel    Frequency of Communication with Friends and Family: More than three times a week    Frequency of Social Gatherings with Friends and Family: Once a week    Attends Religious Services: More than 4 times per year    Active Member of [employer] or Organizations: No     Attends Banker Meetings: Never    Marital Status: Never married  Intimate Partner Violence: Not At Risk (12/04/2023)   Humiliation, Afraid, Rape, and Kick questionnaire    Fear of Current or Ex-Partner: No    Emotionally Abused: No    Physically Abused: No    Sexually Abused: No    FH:  Family History  Problem Relation Age of Onset   Hypertension Mother    Congestive Heart Failure Mother    Hypertension Sister    Diabetes Sister    Pancreatitis Sister    COPD Sister    Emphysema Sister        smoked   Pancreatitis Brother    Anemia Neg Hx    Arrhythmia Neg Hx    Asthma Neg Hx    Clotting disorder Neg Hx    Fainting Neg Hx    Heart attack Neg Hx    Heart disease Neg Hx  Heart failure Neg Hx    Hyperlipidemia Neg Hx     Past Medical History:  Diagnosis Date   AICD (automatic cardioverter/defibrillator) present    Aortic atherosclerosis (HCC)    Asthma    Chronic respiratory failure (HCC)    CKD (chronic kidney disease), stage III (HCC)    COPD (chronic obstructive pulmonary disease) (HCC)    Deafness in right ear    Diabetes mellitus without complication (HCC)    Dyspnea    GERD (gastroesophageal reflux disease)    Gout    HFrEF (heart failure with reduced ejection fraction) (HCC)    a.) TTE 12/10/14: EF 25%; diff inf HK; sev LV and mod LA dil; LVH. b.) TTE 06/23/18: EF 20-25%; LVH; mild LV dil, mild BAE. c.) TTE 03/01/19: EF 15%, LVH, BAE; triv PR/TR. d.) TTE 02/27/20: EF < 20%; sev LV dil; mild Oscar. e.) TTE 11/23/20: EF < 20%; glob HK; sev LV dil; LVH; mild-mod BAE; mod-sev TR, triv AR; G1DD. f.) TTE 05/14/21: EF <15%; LVH; sev LA and mild RV enlar; triv AR/PR, mild TR, mod Oscar.   Hiatal hernia    History of cardiac catheterization    a.) R/LHC 12/14/2009: normal coronaries. b.) R/LHC 12/11/2014: normal coronaries.   Hyperlipidemia    Hypertension    Hypoxemia    LBBB (left bundle branch block)    NICM (nonischemic cardiomyopathy; dilated cardiomyopathy)  (HCC)    a.) R/LHC 12/14/2009: normal cors; LVEDP 18 mmHg, mean PA 29 mmHg, mean PCWP 31 mmHg; CO 6 L/min; CI 2.54 L/min/m. b.) TTE 12/10/2014: EF 25%. c.) R/LHC 12/11/2014: mean RA 9 mmHg, mean PA 22 mmHg, mean PCWP 22 mmHg. d.) TTE 06/23/2018: EF 20-25%. e.) TTE 03/01/2019: EF 15%. f.) TTE 02/27/2020: < 20%. g.) TTE 11/23/2020: EF <20%. h.) TTE 05/14/2021: < 15%.   NSTEMI (non-ST elevated myocardial infarction) (HCC)    a.) x 3 per patient report ---> 2012, 2014, 2016   Obesity    On supplemental oxygen by nasal cannula    a.) 2-3 L/[place] PRN   OSA on CPAP    PAF (paroxysmal atrial fibrillation) (HCC)    a.) CHA2DS2-VASc = 4 (HFrEF, HTN, prior MI, T2DM). b.) rate/rhythm maintained without pharmacologial intervention; no current anticoagulation.   Pancreatitis    PSVT (paroxysmal supraventricular tachycardia) (HCC)     Current Outpatient Medications  Medication Sig Dispense Refill   albuterol  (VENTOLIN  HFA) 108 (90 Base) MCG/ACT inhaler Inhale 2 puffs into the lungs every 6 (six) hours as needed for wheezing or shortness of breath. 8 g 2   allopurinol  (ZYLOPRIM ) 100 MG tablet Take 100 mg by mouth daily.     amiodarone  (PACERONE ) 200 MG tablet Take 1 tablet (200 mg total) by mouth 2 (two) times daily. (Patient taking differently: Take 200 mg by mouth daily.) 60 tablet 0   atorvastatin  (LIPITOR ) 40 MG tablet Take 40 mg by mouth daily.     colchicine  0.6 MG tablet Take 0.6 mg by mouth daily.     dapagliflozin  propanediol (FARXIGA ) 10 MG TABS tablet Take 1 tablet (10 mg total) by mouth daily. 30 tablet 2   Fluticasone -Umeclidin-Vilant (TRELEGY ELLIPTA ) 100-62.5-25 MCG/ACT AEPB Inhale 1 puff into the lungs daily. 60 each 11   metoprolol  succinate (TOPROL -XL) 25 MG 24 hr tablet Take 1 tablet (25 mg total) by mouth daily. Take with or immediately following a meal. 30 tablet 11   midodrine  (PROAMATINE ) 5 MG tablet Take 1 tablet (5 mg total) by mouth 3 (three)  times daily as needed. If bp top number is  lower than 100 90 tablet 5   NON FORMULARY Pt uses a cpap nightly     pantoprazole  (PROTONIX ) 40 MG tablet Take 1 tablet (40 mg total) by mouth daily.     Torsemide  40 MG TABS Take 40 mg by mouth daily for 3 days. 3 tablet 0   No current facility-administered medications for this visit.   Vitals:   02/24/24 0905  BP: 99/74  Pulse: 76  SpO2: 98%  Weight: (!) 309 lb (140.2 kg)   Wt Readings from Last 3 Encounters:  02/24/24 (!) 309 lb (140.2 kg)  02/17/24 (!) 303 lb 9.6 oz (137.7 kg)  02/04/24 (!) 312 lb (141.5 kg)   Lab Results  Component Value Date   CREATININE 1.77 (H) 02/04/2024   CREATININE 1.78 (H) 02/03/2024   CREATININE 1.70 (H) 12/16/2023    PHYSICAL EXAM:  General: Well appearing. No resp difficulty HEENT: normal Neck: supple, no JVD Cor: Regular rhythm, rate. No rubs, gallops or murmurs Lungs: clear Abdomen: soft, nontender, nondistended. Extremities: no cyanosis, clubbing, rash, edema Neuro: alert & oriented X 3. Moves all 4 extremities w/o difficulty. Affect pleasant   ECG: not done   ASSESSMENT & PLAN:  1: Chronic heart failure with reduced ejection fraction- - NYHA [name] III - euvolemic - weighing daily; Reminded to call for an overnight weight gain of >2 pounds or a weekly weight gain of >5 pounds.  - weight down 3 pounds from last visit here 3 weeks ago - encouraged continued fluid restriction of no more than 2L - Stress test 03/01/2019:   Abnormal myocardial perfusion scan no clear evidence of reversible ischemia inferior persistent defect either scar versus body habitus artifact severely dilated left ventricular function severely depressed LV function with ejection fraction of around 11%.   - Echo 03/01/2019: EF of 15% along with mild Oscar.  - Echo 02/27/20: EF of <20% - Echo 11/23/20: EF of <20% along with moderate LVH and moderately elevated PA pressure. - Echo 10/20/22: EF <20% along with mildly elevated PA pressure and mild Oscar.  - Echo 12/01/23: EF  30-35% with normal RV, mild Oscar - AICD (2016) in place but denies any shocks in a long time - continue farxiga  10mg  daily  - continue metoprolol  succinate 25mg  daily  - continue torsemide  40mg  daily - losartan  stopped 02/03/24 but patient has continued to take this; losartan  bottle was removed from patient's medication bag - in the past, spironolactone  has caused hypotension - saw HF provider Pearl) 05/24 - BNP 02/04/24 was 357.2 - BMET  2: HTN with CKD- - BP 99/74; removed losartan  bottle from patient medication bag today - amlodipine  stopped 02/03/24 - continue midodrine  5mg  TID if SBP <100 - sees PCP (Mancheno) at [facility]  - BMP 02/04/24 reviewed: sodium 137, potassium 4.5, Cr 1.77 and GFR 47 - BMET today - saw nephrology Kolleen) 05/25  3: Obstructive sleep apnea- - wears nocturnal ventilator QHS, ~ 6-8 hours/night; reports sleeping well - continues wearing oxygen at 3L at bedtime & PRN during the day - saw pulmonology Gayl) 04/25 - advised to f/u with pulmonology about getting sleep study updated so that he can get consistent supplies  4: Diabetes- - A1c 12/03/23 was 8.5%  5: Atrial fibrillation- - saw cardiology Debby) 06/25 - currently taking amiodarone  200mg  daily  - does not want to resume apixaban ; aware of stroke risk - last eye exam ~ 03/25 - TSH today  6: VT- - Mg 12/06/23 reviewed and was 2.3 - 8 months ICD estimated battery longevity. Referred to Dr. Debby, per [facility] cardiology, to evaluate for CRT-D upgrade with HFrEF and LBBB.    7: Hyperlipidemia- - LDL 02/04/24 was 194 - lipid panel today now that he's consistently taking his statin.  - continue atorvastatin  40mg  daily   Return in 5 months, sooner if needed.  Oscar DELENA Class, FNP 02/24/24

## 2024-02-24 NOTE — Patient Instructions (Signed)
 There has been no changes to your medications.  Go DOWN to LOWER LEVEL (LL) to have your blood work completed inside of [employer] office.  We will only call you if the results are abnormal or if the provider would like to make medication changes.  PLEASE CALL YOUR PULMONOLOGIST ( LUNG DOCTOR) TO ARRANGE A SLEEP STUDY.  Follow-Up in: 5 months ( November)   At the [facility], you and your health needs are our priority. We have a designated team specialized in the treatment of Heart Failure. This Care Team includes your primary Heart Failure Specialized Cardiologist (physician), Advanced Practice Providers (APPs- Physician Assistants and Nurse Practitioners), and Pharmacist who all work together to provide you with the care you need, when you need it.   You may see any of the following providers on your designated Care Team at your next follow up:  Dr. Toribio Fuel Dr. Ezra Shuck Dr. Ria Commander Dr. Odis Brownie Ellouise Class, FNP Jaun Bash, RPH-CPP  Please be sure to bring in all your medications bottles to every appointment.   Need to Contact Us :  If you have any questions or concerns before your next appointment please send us  a message through [place] or call our office at [phone].    TO LEAVE A MESSAGE FOR THE NURSE SELECT OPTION 2, PLEASE LEAVE A MESSAGE INCLUDING: YOUR NAME DATE OF BIRTH CALL BACK NUMBER REASON FOR CALL**this is important as we prioritize the call backs  YOU WILL RECEIVE A CALL BACK THE SAME DAY AS LONG AS YOU CALL BEFORE 4:00 PM

## 2024-02-24 NOTE — Telephone Encounter (Signed)
 Oscar [name] reports he was not attended Cardiac Rehab due to a flare up of Gout. Reports he will be able to attend on 02/29/24

## 2024-02-25 ENCOUNTER — Ambulatory Visit: Payer: Self-pay | Admitting: Family

## 2024-02-26 ENCOUNTER — Other Ambulatory Visit

## 2024-02-29 ENCOUNTER — Encounter

## 2024-02-29 ENCOUNTER — Telehealth: Payer: Self-pay | Admitting: Surgery

## 2024-02-29 DIAGNOSIS — I5022 Chronic systolic (congestive) heart failure: Secondary | ICD-10-CM | POA: Diagnosis not present

## 2024-02-29 LAB — GLUCOSE, CAPILLARY
Glucose-Capillary: 243 mg/dL — ABNORMAL HIGH (ref 70–99)
Glucose-Capillary: 213 mg/dL — ABNORMAL HIGH (ref 70–99)

## 2024-02-29 NOTE — Telephone Encounter (Signed)
 Pt said that the dr was referring him to someone for Hair cyst removal and the person her had referred him to is 1 yr wait. He wants  to know if there is someone else you could send him to for it is starting to hurt him when he is setting down. Please advise. Pt [phone]

## 2024-02-29 NOTE — Progress Notes (Signed)
 Daily Session Note  Patient Details  Name: Oscar [name] MRN: [id number] Date of Birth: 25-Feb-1976 Referring Provider:   Flowsheet Row Cardiac Rehab from 01/26/2024 in [facility]iac [facility] Pulmonary [facility]  Referring Provider Florencio Kava, MD    Encounter Date: 02/29/2024  Check In:  Session Check In - 02/29/24 0858       Check-In   Supervising physician immediately available to respond to emergencies See telemetry face sheet for immediately available ER MD    Location [facility]-Cardiac & Pulmonary Rehab    Staff Present Burnard Davenport RN,BSN,MPA;Maxon Conetta BS, Exercise Physiologist;Joseph Rolinda RCP,RRT,BSRT    Virtual Visit No    Medication changes reported     No    Fall or balance concerns reported    No    Warm-up and Cool-down Performed on first and last piece of equipment    Resistance Training Performed Yes    VAD Patient? No    PAD/SET Patient? No      Pain Assessment   Currently in Pain? No/denies             Social History   Tobacco Use  Smoking Status Former   Current packs/day: 0.00   Average packs/day: 1.5 packs/day for 34.0 years (51.0 ttl pk-yrs)   Types: Cigarettes   Quit date: 05/2023   Years since quitting: 0.8   Passive exposure: Past  Smokeless Tobacco Never  Tobacco Comments   Quit smoking 05/2023    Goals Met:  Independence with exercise equipment Exercise tolerated well No report of concerns or symptoms today Strength training completed today  Goals Unmet:  Not Applicable  Comments: Pt able to follow exercise prescription today without complaint.  Will continue to monitor for progression.    Dr. Oneil Pinal is Medical Director for [facility] Cardiac Rehabilitation.  Dr. Fuad Aleskerov is Medical Director for [facility] Pulmonary Rehabilitation.

## 2024-03-02 ENCOUNTER — Encounter: Payer: Self-pay | Admitting: *Deleted

## 2024-03-02 ENCOUNTER — Encounter: Attending: Family Medicine

## 2024-03-02 DIAGNOSIS — I5022 Chronic systolic (congestive) heart failure: Secondary | ICD-10-CM

## 2024-03-02 MED ORDER — ATORVASTATIN CALCIUM 80 MG PO TABS: 80.0000 mg | ORAL_TABLET | Freq: Every day | ORAL | 3 refills | Status: AC

## 2024-03-02 NOTE — Progress Notes (Signed)
 Cardiac Individual Treatment Plan  Patient Details  Name: Oscar [name] MRN: [id number] Date of Birth: 1975/11/16 Referring Provider:   Flowsheet Row Cardiac Rehab from 01/26/2024 in [facility] Cardiac and Pulmonary Rehab  Referring Provider Florencio Kava, MD    Initial Encounter Date:  Flowsheet Row Cardiac Rehab from 01/26/2024 in [facility] Cardiac and Pulmonary Rehab  Date 01/26/24    Visit Diagnosis: Chronic systolic heart failure (HCC)  Patient's Home Medications on Admission:  Current Outpatient Medications:    albuterol  (VENTOLIN  HFA) 108 (90 Base) MCG/ACT inhaler, Inhale 2 puffs into the lungs every 6 (six) hours as needed for wheezing or shortness of breath., Disp: 8 g, Rfl: 2   allopurinol  (ZYLOPRIM ) 100 MG tablet, Take 100 mg by mouth daily., Disp: , Rfl:    amiodarone  (PACERONE ) 200 MG tablet, Take 1 tablet (200 mg total) by mouth daily., Disp: , Rfl:    atorvastatin  (LIPITOR ) 40 MG tablet, Take 40 mg by mouth daily., Disp: , Rfl:    colchicine  0.6 MG tablet, Take 0.6 mg by mouth daily., Disp: , Rfl:    dapagliflozin  propanediol (FARXIGA ) 10 MG TABS tablet, Take 1 tablet (10 mg total) by mouth daily., Disp: 30 tablet, Rfl: 2   doxycycline  (ADOXA) 100 MG tablet, Take 100 mg by mouth 2 (two) times daily., Disp: , Rfl:    Fluticasone -Umeclidin-Vilant (TRELEGY ELLIPTA ) 100-62.5-25 MCG/ACT AEPB, Inhale 1 puff into the lungs daily., Disp: 60 each, Rfl: 11   metoprolol  succinate (TOPROL -XL) 25 MG 24 hr tablet, Take 1 tablet (25 mg total) by mouth daily. Take with or immediately following a meal., Disp: 30 tablet, Rfl: 11   midodrine  (PROAMATINE ) 5 MG tablet, Take 1 tablet (5 mg total) by mouth 3 (three) times daily as needed. If bp top number is lower than 100, Disp: 90 tablet, Rfl: 5   NON FORMULARY, Pt uses a cpap nightly, Disp: , Rfl:    pantoprazole  (PROTONIX ) 40 MG tablet, Take 1 tablet (40 mg total) by mouth daily., Disp: , Rfl:    Torsemide  40 MG TABS, Take 40 mg by mouth daily  for 3 days., Disp: 3 tablet, Rfl: 0  Past Medical History: Past Medical History:  Diagnosis Date   AICD (automatic cardioverter/defibrillator) present    Aortic atherosclerosis (HCC)    Asthma    Chronic respiratory failure (HCC)    CKD (chronic kidney disease), stage III (HCC)    COPD (chronic obstructive pulmonary disease) (HCC)    Deafness in right ear    Diabetes mellitus without complication (HCC)    Dyspnea    GERD (gastroesophageal reflux disease)    Gout    HFrEF (heart failure with reduced ejection fraction) (HCC)    a.) TTE 12/10/14: EF 25%; diff inf HK; sev LV and mod LA dil; LVH. b.) TTE 06/23/18: EF 20-25%; LVH; mild LV dil, mild BAE. c.) TTE 03/01/19: EF 15%, LVH, BAE; triv PR/TR. d.) TTE 02/27/20: EF < 20%; sev LV dil; mild MR. e.) TTE 11/23/20: EF < 20%; glob HK; sev LV dil; LVH; mild-mod BAE; mod-sev TR, triv AR; G1DD. f.) TTE 05/14/21: EF <15%; LVH; sev LA and mild RV enlar; triv AR/PR, mild TR, mod MR.   Hiatal hernia    History of cardiac catheterization    a.) R/LHC 12/14/2009: normal coronaries. b.) R/LHC 12/11/2014: normal coronaries.   Hyperlipidemia    Hypertension    Hypoxemia    LBBB (left bundle branch block)    NICM (nonischemic cardiomyopathy; dilated cardiomyopathy) (HCC)  a.) R/LHC 12/14/2009: normal cors; LVEDP 18 mmHg, mean PA 29 mmHg, mean PCWP 31 mmHg; CO 6 L/min; CI 2.54 L/min/m. b.) TTE 12/10/2014: EF 25%. c.) R/LHC 12/11/2014: mean RA 9 mmHg, mean PA 22 mmHg, mean PCWP 22 mmHg. d.) TTE 06/23/2018: EF 20-25%. e.) TTE 03/01/2019: EF 15%. f.) TTE 02/27/2020: < 20%. g.) TTE 11/23/2020: EF <20%. h.) TTE 05/14/2021: < 15%.   NSTEMI (non-ST elevated myocardial infarction) (HCC)    a.) x 3 per patient report ---> 2012, 2014, 2016   Obesity    On supplemental oxygen by nasal cannula    a.) 2-3 L/[place] PRN   OSA on CPAP    PAF (paroxysmal atrial fibrillation) (HCC)    a.) CHA2DS2-VASc = 4 (HFrEF, HTN, prior MI, T2DM). b.) rate/rhythm maintained without  pharmacologial intervention; no current anticoagulation.   Pancreatitis    PSVT (paroxysmal supraventricular tachycardia) (HCC)     Tobacco Use: Social History   Tobacco Use  Smoking Status Former   Current packs/day: 0.00   Average packs/day: 1.5 packs/day for 34.0 years (51.0 ttl pk-yrs)   Types: Cigarettes   Quit date: 05/2023   Years since quitting: 0.8   Passive exposure: Past  Smokeless Tobacco Never  Tobacco Comments   Quit smoking 05/2023    Labs: Review Flowsheet  More data exists      Latest Ref Rng & Units 06/13/2023 12/03/2023 12/04/2023 02/04/2024 02/24/2024  Labs for ITP Cardiac and Pulmonary Rehab  Cholestrol 0 - 200 mg/dL - - - 743  787   LDL (calc) 0 - 99 mg/dL - - - 805  842   HDL-C >40 mg/dL - - - 41  38   Trlycerides <150 mg/dL - - - 896  87   Hemoglobin A1c 4.8 - 5.6 % 7.3  8.5  - - -  Bicarbonate 20.0 - 28.0 mmol/L - - 29.2  - -  O2 Saturation % - - 89.3  - -     Exercise Target Goals: Exercise Program Goal: Individual exercise prescription set using results from initial 6 min walk test and THRR while considering  patient's activity barriers and safety.   Exercise Prescription Goal: Initial exercise prescription builds to 30-45 minutes a day of aerobic activity, 2-3 days per week.  Home exercise guidelines will be given to patient during program as part of exercise prescription that the participant will acknowledge.   Education: Aerobic Exercise: - Group verbal and visual presentation on the components of exercise prescription. Introduces F.I.T.T principle from ACSM for exercise prescriptions.  Reviews F.I.T.T. principles of aerobic exercise including progression. Written material given at graduation. Flowsheet Row Cardiac Rehab from 03/02/2024 in [facility] Cardiac and Pulmonary Rehab  Education need identified 01/26/24    Education: Resistance Exercise: - Group verbal and visual presentation on the components of exercise prescription. Introduces F.I.T.T  principle from ACSM for exercise prescriptions  Reviews F.I.T.T. principles of resistance exercise including progression. Written material given at graduation. Flowsheet Row Cardiac Rehab from 03/02/2024 in [facility] Cardiac and Pulmonary Rehab  Education need identified 01/26/24     Education: Exercise & Equipment Safety: - Individual verbal instruction and demonstration of equipment use and safety with use of the equipment. Flowsheet Row Cardiac Rehab from 03/02/2024 in [facility] Cardiac and Pulmonary Rehab  Date 01/26/24  Educator MB  Instruction Review Code 1- Verbalizes Understanding    Education: Exercise Physiology & General Exercise Guidelines: - Group verbal and written instruction with models to review the exercise physiology of the cardiovascular  system and associated critical values. Provides general exercise guidelines with specific guidelines to those with heart or lung disease.  Flowsheet Row Cardiac Rehab from 03/02/2024 in [facility] Cardiac and Pulmonary Rehab  Education need identified 01/26/24    Education: Flexibility, Balance, Mind/Body Relaxation: - Group verbal and visual presentation with interactive activity on the components of exercise prescription. Introduces F.I.T.T principle from ACSM for exercise prescriptions. Reviews F.I.T.T. principles of flexibility and balance exercise training including progression. Also discusses the mind body connection.  Reviews various relaxation techniques to help reduce and manage stress (i.e. Deep breathing, progressive muscle relaxation, and visualization). Balance handout provided to take home. Written material given at graduation.   Activity Barriers & Risk Stratification:  Activity Barriers & Cardiac Risk Stratification - 01/26/24 1028       Activity Barriers & Cardiac Risk Stratification   Activity Barriers Joint Problems;Other (comment)    Comments Elbow joint, gout in both feet    Cardiac Risk Stratification High          6 Minute  Walk:  6 Minute Walk     Row Name 01/26/24 1027         6 Minute Walk   Phase Initial     Distance 800 feet     Walk Time 6 minutes     # of Rest Breaks 0     MPH 1.52     METS 2.06     RPE 11     Perceived Dyspnea  1     VO2 Peak 7.21     Symptoms No     Resting HR 60 bpm     Resting BP 104/70     Resting Oxygen Saturation  98 %     Exercise Oxygen Saturation  during 6 min walk 97 %     Max Ex. HR 100 bpm     Max Ex. BP 118/80     2 Minute Post BP 100/70        Oxygen Initial Assessment:  Oxygen Initial Assessment - 01/20/24 1430       Home Oxygen   Home Oxygen Device Home Concentrator;E-Tanks    Sleep Oxygen Prescription Continuous;CPAP    Liters per minute 3    Home Exercise Oxygen Prescription Continuous    Liters per minute 3   when winded, 2 l when not winded   Home Resting Oxygen Prescription Continuous    Liters per minute 2    Compliance with Home Oxygen Use Yes      Intervention   Short Term Goals To learn and exhibit compliance with exercise, home and travel O2 prescription;To learn and understand importance of monitoring SPO2 with pulse oximeter and demonstrate accurate use of the pulse oximeter.;To learn and understand importance of maintaining oxygen saturations>88%;To learn and demonstrate proper pursed lip breathing techniques or other breathing techniques. ;To learn and demonstrate proper use of respiratory medications    Long  Term Goals Exhibits compliance with exercise, home  and travel O2 prescription;Verbalizes importance of monitoring SPO2 with pulse oximeter and return demonstration;Maintenance of O2 saturations>88%;Exhibits proper breathing techniques, such as pursed lip breathing or other method taught during program session;Compliance with respiratory medication;Demonstrates proper use of MDI's          Oxygen Re-Evaluation:  Oxygen Re-Evaluation     Row Name 02/01/24 1014             Home Oxygen   Home Oxygen Device Home  Concentrator;E-Tanks  Sleep Oxygen Prescription Continuous;CPAP       Liters per minute 3       Home Exercise Oxygen Prescription Continuous       Liters per minute 3       Home Resting Oxygen Prescription Continuous       Liters per minute 2       Compliance with Home Oxygen Use Yes         Goals/Expected Outcomes   Short Term Goals To learn and exhibit compliance with exercise, home and travel O2 prescription;To learn and understand importance of monitoring SPO2 with pulse oximeter and demonstrate accurate use of the pulse oximeter.;To learn and understand importance of maintaining oxygen saturations>88%;To learn and demonstrate proper pursed lip breathing techniques or other breathing techniques. ;To learn and demonstrate proper use of respiratory medications       Long  Term Goals Exhibits compliance with exercise, home  and travel O2 prescription;Verbalizes importance of monitoring SPO2 with pulse oximeter and return demonstration;Maintenance of O2 saturations>88%;Exhibits proper breathing techniques, such as pursed lip breathing or other method taught during program session;Compliance with respiratory medication;Demonstrates proper use of MDI's       Comments Reviewed RPE and dyspnea scale, THR and program prescription with pt today.  Pt voiced understanding and was given a copy of goals to take home.       Goals/Expected Outcomes Short: Use RPE daily to regulate intensity. Long: Follow program prescription in THR.          Oxygen Discharge (Final Oxygen Re-Evaluation):  Oxygen Re-Evaluation - 02/01/24 1014       Home Oxygen   Home Oxygen Device Home Concentrator;E-Tanks    Sleep Oxygen Prescription Continuous;CPAP    Liters per minute 3    Home Exercise Oxygen Prescription Continuous    Liters per minute 3    Home Resting Oxygen Prescription Continuous    Liters per minute 2    Compliance with Home Oxygen Use Yes      Goals/Expected Outcomes   Short Term Goals To learn and  exhibit compliance with exercise, home and travel O2 prescription;To learn and understand importance of monitoring SPO2 with pulse oximeter and demonstrate accurate use of the pulse oximeter.;To learn and understand importance of maintaining oxygen saturations>88%;To learn and demonstrate proper pursed lip breathing techniques or other breathing techniques. ;To learn and demonstrate proper use of respiratory medications    Long  Term Goals Exhibits compliance with exercise, home  and travel O2 prescription;Verbalizes importance of monitoring SPO2 with pulse oximeter and return demonstration;Maintenance of O2 saturations>88%;Exhibits proper breathing techniques, such as pursed lip breathing or other method taught during program session;Compliance with respiratory medication;Demonstrates proper use of MDI's    Comments Reviewed RPE and dyspnea scale, THR and program prescription with pt today.  Pt voiced understanding and was given a copy of goals to take home.    Goals/Expected Outcomes Short: Use RPE daily to regulate intensity. Long: Follow program prescription in THR.          Initial Exercise Prescription:  Initial Exercise Prescription - 01/26/24 1000       Date of Initial Exercise RX and Referring Provider   Date 01/26/24    Referring Provider Florencio Kava, MD      Oxygen   Oxygen Continuous    Liters 2-3L    Maintain Oxygen Saturation 88% or higher      Treadmill   MPH 1.4    Grade 0    Minutes 15  METs 2.07      Recumbant Bike   Level 2    RPM 50    Watts 25    Minutes 15    METs 2.06      NuStep   Level 2    SPM 80    Minutes 15    METs 2.06      T5 Nustep   Level 2    SPM 80    Minutes 15    METs 2.06      Biostep-RELP   Level 2    SPM 50    Minutes 15    METs 2.06      Prescription Details   Frequency (times per week) 2    Duration Progress to 30 minutes of continuous aerobic without signs/symptoms of physical distress      Intensity   THRR  40-80% of Max Heartrate 104-149    Ratings of Perceived Exertion 11-13    Perceived Dyspnea 0-4      Progression   Progression Continue to progress workloads to maintain intensity without signs/symptoms of physical distress.      Resistance Training   Training Prescription Yes    Weight 3 lb, 5 lb   3 lb (R), 5 lb (L)   Reps 10-15          Perform Capillary Blood Glucose checks as needed.  Exercise Prescription Changes:   Exercise Prescription Changes     Row Name 01/26/24 1000 02/18/24 1600           Response to Exercise   Blood Pressure (Admit) 104/70 108/72      Blood Pressure (Exercise) 118/80 128/80      Blood Pressure (Exit) 100/70 118/74      Heart Rate (Admit) 60 bpm 81 bpm      Heart Rate (Exercise) 100 bpm 105 bpm      Heart Rate (Exit) 60 bpm 75 bpm      Oxygen Saturation (Admit) 98 % --      Oxygen Saturation (Exercise) 97 % --      Oxygen Saturation (Exit) 97 % --      Rating of Perceived Exertion (Exercise) 11 11      Perceived Dyspnea (Exercise) 1 1      Symptoms none none      Comments results first 2 weeks of exercise      Duration -- Progress to 30 minutes of  aerobic without signs/symptoms of physical distress      Intensity THRR New THRR unchanged        Progression   Progression -- Continue to progress workloads to maintain intensity without signs/symptoms of physical distress.      Average METs 2.06 5.46        Resistance Training   Training Prescription -- Yes      Weight -- 3 lb, 5 lb  3 lb (R), 5 lb (L)      Reps -- 10-15        Interval Training   Interval Training -- No        Oxygen   Oxygen -- Continuous      Liters -- 3L        Treadmill   MPH -- 1      Grade -- 0      Minutes -- 15      METs -- 1.77        T5 Nustep   Level -- 2  Minutes -- 15      METs -- 2        Oxygen   Maintain Oxygen Saturation -- 88% or higher         Exercise Comments:   Exercise Comments     Row Name 02/01/24 1012            Exercise Comments First full day of exercise!  Patient was oriented to gym and equipment including functions, settings, policies, and procedures.  Patient's individual exercise prescription and treatment plan were reviewed.  All starting workloads were established based on the results of the 6 minute walk test done at initial orientation visit.  The plan for exercise progression was also introduced and progression will be customized based on patient's performance and goals.          Exercise Goals and Review:   Exercise Goals     Row Name 01/26/24 1036             Exercise Goals   Increase Physical Activity Yes       Intervention Provide advice, education, support and counseling about physical activity/exercise needs.;Develop an individualized exercise prescription for aerobic and resistive training based on initial evaluation findings, risk stratification, comorbidities and participant's personal goals.       Expected Outcomes Short Term: Attend rehab on a regular basis to increase amount of physical activity.;Long Term: Add in home exercise to make exercise part of routine and to increase amount of physical activity.;Long Term: Exercising regularly at least 3-5 days a week.       Increase Strength and Stamina Yes       Intervention Provide advice, education, support and counseling about physical activity/exercise needs.;Develop an individualized exercise prescription for aerobic and resistive training based on initial evaluation findings, risk stratification, comorbidities and participant's personal goals.       Expected Outcomes Short Term: Increase workloads from initial exercise prescription for resistance, speed, and METs.;Short Term: Perform resistance training exercises routinely during rehab and add in resistance training at home;Long Term: Improve cardiorespiratory fitness, muscular endurance and strength as measured by increased METs and functional capacity ( )       Able to  understand and use rate of perceived exertion (RPE) scale Yes       Intervention Provide education and explanation on how to use RPE scale       Expected Outcomes Short Term: Able to use RPE daily in rehab to express subjective intensity level;Long Term:  Able to use RPE to guide intensity level when exercising independently       Able to understand and use Dyspnea scale Yes       Intervention Provide education and explanation on how to use Dyspnea scale       Expected Outcomes Short Term: Able to use Dyspnea scale daily in rehab to express subjective sense of shortness of breath during exertion;Long Term: Able to use Dyspnea scale to guide intensity level when exercising independently       Knowledge and understanding of Target Heart Rate Range (THRR) Yes       Intervention Provide education and explanation of THRR including how the numbers were predicted and where they are located for reference       Expected Outcomes Short Term: Able to state/look up THRR;Short Term: Able to use daily as guideline for intensity in rehab;Long Term: Able to use THRR to govern intensity when exercising independently       Able to check pulse independently  Yes       Intervention Provide education and demonstration on how to check pulse in carotid and radial arteries.;Review the importance of being able to check your own pulse for safety during independent exercise       Expected Outcomes Short Term: Able to explain why pulse checking is important during independent exercise;Long Term: Able to check pulse independently and accurately       Understanding of Exercise Prescription Yes       Intervention Provide education, explanation, and written materials on patient's individual exercise prescription       Expected Outcomes Short Term: Able to explain program exercise prescription;Long Term: Able to explain home exercise prescription to exercise independently          Exercise Goals Re-Evaluation :  Exercise Goals  Re-Evaluation     Row Name 02/18/24 1629 03/01/24 1337           Exercise Goal Re-Evaluation   Exercise Goals Review Increase Physical Activity;Increase Strength and Stamina;Understanding of Exercise Prescription Increase Physical Activity;Increase Strength and Stamina;Understanding of Exercise Prescription      Comments Oscar [name] is off to a good start in the program. He was able to attend his first 2 sessions during this review period. During these sessions he was able to use the treadmill at 1 mph and no incline, and the T5 nustep at level 2. We will continue to monitor his progress in the program. Oscar [name] has not attended rehab since the last review. We will contact him to see when he plans to return to regular attendance in the program. We will continue to monitor his progress when he returns to rehab.      Expected Outcomes Short: Continue to follow exercise prescription. Long: Continue exercise to improve strength and stamina. Short: Return to rehab when appropriate. Long: Graduate from the program.         Discharge Exercise Prescription (Final Exercise Prescription Changes):  Exercise Prescription Changes - 02/18/24 1600       Response to Exercise   Blood Pressure (Admit) 108/72    Blood Pressure (Exercise) 128/80    Blood Pressure (Exit) 118/74    Heart Rate (Admit) 81 bpm    Heart Rate (Exercise) 105 bpm    Heart Rate (Exit) 75 bpm    Rating of Perceived Exertion (Exercise) 11    Perceived Dyspnea (Exercise) 1    Symptoms none    Comments first 2 weeks of exercise    Duration Progress to 30 minutes of  aerobic without signs/symptoms of physical distress    Intensity THRR unchanged      Progression   Progression Continue to progress workloads to maintain intensity without signs/symptoms of physical distress.    Average METs 5.46      Resistance Training   Training Prescription Yes    Weight 3 lb, 5 lb   3 lb (R), 5 lb (L)   Reps 10-15      Interval Training   Interval  Training No      Oxygen   Oxygen Continuous    Liters 3L      Treadmill   MPH 1    Grade 0    Minutes 15    METs 1.77      T5 Nustep   Level 2    Minutes 15    METs 2      Oxygen   Maintain Oxygen Saturation 88% or higher  Nutrition:  Target Goals: Understanding of nutrition guidelines, daily intake of sodium 1500mg , cholesterol 200mg , calories 30% from fat and 7% or less from saturated fats, daily to have 5 or more servings of fruits and vegetables.  Education: All About Nutrition: -Group instruction provided by verbal, written material, interactive activities, discussions, models, and posters to present general guidelines for heart healthy nutrition including fat, fiber, MyPlate, the role of sodium in heart healthy nutrition, utilization of the nutrition label, and utilization of this knowledge for meal planning. Follow up email sent as well. Written material given at graduation. Flowsheet Row Cardiac Rehab from 03/02/2024 in [facility] Cardiac and Pulmonary Rehab  Education need identified 01/26/24    Biometrics:  Pre Biometrics - 01/26/24 1036       Pre Biometrics   Height 5' 9.1 (1.755 m)    Weight 308 lb 4.8 oz (139.8 kg)    Waist Circumference 58 inches    Hip Circumference 55 inches    Waist to Hip Ratio 1.05 %    BMI (Calculated) 45.4    Single Leg Stand 7.9 seconds           Nutrition Therapy Plan and Nutrition Goals:  Nutrition Therapy & Goals - 02/01/24 1340       Nutrition Therapy   Diet Carb controlled, Cardiac, Low Na    Protein (specify units) 90g    Fiber 30 grams    Whole Grain Foods 3 servings    Saturated Fats 15 max. grams    Fruits and Vegetables 5 servings/day    Sodium 2 grams      Personal Nutrition Goals   Nutrition Goal Cut out sugary beverages, drink 32-48oz water daily    Personal Goal #2 Eat 15-30gProtein and 30-60gCarbs at each meal.    Personal Goal #3 Read labels and reduce sodium intake to below 2300mg . Ideally  1500mg  per day.    Comments Patient drinking mostly sugary beverages like juice and soda. Says he has tried to move away from soda, reports he was drinking 4-6L of soda a day. Explained to him the negative impact sugary beverages have on his health, blood sugar and weight. Set goal to switch over and drink more water, with short term goal of ~32-48oz of water daily with 64-80oz as a long term goal. Educated on carb portions of ~30-60g per meal, the importance of eating throughout the day and choosing high quality carbs rather than junk food and sugary beverages. Provided him with Mediterranean diet handout. Educated on types of fats, sources, and how to read labels. He reports he adds salt to his foods, has been told to limit and look for better alternatives like misses dash. Confirmed and encouraged him to not salt food. Reinforced focus on keeping carb portions controlled at meals and limiting sodium.      Intervention Plan   Intervention Prescribe, educate and counsel regarding individualized specific dietary modifications aiming towards targeted core components such as weight, hypertension, lipid management, diabetes, heart failure and other comorbidities.;Nutrition handout(s) given to patient.    Expected Outcomes Short Term Goal: Understand basic principles of dietary content, such as calories, fat, sodium, cholesterol and nutrients.;Short Term Goal: A plan has been developed with personal nutrition goals set during dietitian appointment.;Long Term Goal: Adherence to prescribed nutrition plan.          Nutrition Assessments:  MEDIFICTS Score Key: >=70 Need to make dietary changes  40-70 Heart Healthy Diet <= 40 Therapeutic Level Cholesterol Diet  Flowsheet Row  Cardiac Rehab from 02/01/2024 in [facility] Cardiac and Pulmonary Rehab  Picture Your Plate Total Score on Admission 60   Picture Your Plate Scores: <59 Unhealthy dietary pattern with much room for improvement. 41-50 Dietary pattern  unlikely to meet recommendations for good health and room for improvement. 51-60 More healthful dietary pattern, with some room for improvement.  >60 Healthy dietary pattern, although there may be some specific behaviors that could be improved.    Nutrition Goals Re-Evaluation:   Nutrition Goals Discharge (Final Nutrition Goals Re-Evaluation):   Psychosocial: Target Goals: Acknowledge presence or absence of significant depression and/or stress, maximize coping skills, provide positive support system. Participant is able to verbalize types and ability to use techniques and skills needed for reducing stress and depression.   Education: Stress, Anxiety, and Depression - Group verbal and visual presentation to define topics covered.  Reviews how body is impacted by stress, anxiety, and depression.  Also discusses healthy ways to reduce stress and to treat/manage anxiety and depression.  Written material given at graduation.   Education: Sleep Hygiene -Provides group verbal and written instruction about how sleep can affect your health.  Define sleep hygiene, discuss sleep cycles and impact of sleep habits. Review good sleep hygiene tips.    Initial Review & Psychosocial Screening:  Initial Psych Review & Screening - 01/20/24 1432       Initial Review   Current issues with None Identified      Family Dynamics   Good Support System? Yes   siblings,     Barriers   Psychosocial barriers to participate in program There are no identifiable barriers or psychosocial needs.      Screening Interventions   Interventions Encouraged to exercise;To provide support and resources with identified psychosocial needs;Provide feedback about the scores to participant    Expected Outcomes Short Term goal: Utilizing psychosocial counselor, staff and physician to assist with identification of specific Stressors or current issues interfering with healing process. Setting desired goal for each stressor or  current issue identified.;Long Term Goal: Stressors or current issues are controlled or eliminated.;Short Term goal: Identification and review with participant of any Quality of Life or Depression concerns found by scoring the questionnaire.;Long Term goal: The participant improves quality of Life and PHQ9 Scores as seen by post scores and/or verbalization of changes          Quality of Life Scores:   Quality of Life - 02/01/24 1704       Quality of Life   Select Quality of Life      Quality of Life Scores   Health/Function Pre 18.32 %    Socioeconomic Pre 27.5 %    Psych/Spiritual Pre 30 %    Family Pre 30 %    GLOBAL Pre 23.2 %         Scores of 19 and below usually indicate a poorer quality of life in these areas.  A difference of  2-3 points is a clinically meaningful difference.  A difference of 2-3 points in the total score of the Quality of Life Index has been associated with significant improvement in overall quality of life, self-image, physical symptoms, and general health in studies assessing change in quality of life.  PHQ-9: Review Flowsheet  More data exists      01/26/2024 10/17/2021 06/15/2018 05/18/2018 03/05/2018  Depression screen PHQ 2/9  Decreased Interest 1 0 0 0 0  Down, Depressed, Hopeless 0 0 0 0 1  PHQ - 2 Score 1 0  0 0 1  Altered sleeping 3 - - - -  Tired, decreased energy 1 - - - -  Change in appetite 1 - - - -  Feeling bad or failure about yourself  0 - - - -  Trouble concentrating 0 - - - -  Moving slowly or fidgety/restless 1 - - - -  Suicidal thoughts 0 - - - -  PHQ-9 Score 7 - - - -  Difficult doing work/chores Somewhat difficult - - - -   Interpretation of Total Score  Total Score Depression Severity:  1-4 = Minimal depression, 5-9 = Mild depression, 10-14 = Moderate depression, 15-19 = Moderately severe depression, 20-27 = Severe depression   Psychosocial Evaluation and Intervention:  Psychosocial Evaluation - 01/20/24 1447        Psychosocial Evaluation & Interventions   Comments Oscar [name] has no barriers to attending the program. He lives alone. He is ready to start the program to work on exercise and learning more about amanging his heart failure.    Expected Outcomes STG attend all scheduled sessions, work on exercise progression as tolerated. Attend education sessions LTG COntinues with exercise progression, utilizes nutrition and education information to continue managing his health    Continue Psychosocial Services  Follow up required by staff          Psychosocial Re-Evaluation:   Psychosocial Discharge (Final Psychosocial Re-Evaluation):   Vocational Rehabilitation: Provide vocational rehab assistance to qualifying candidates.   Vocational Rehab Evaluation & Intervention:   Education: Education Goals: Education classes will be provided on a variety of topics geared toward better understanding of heart health and risk factor modification. Participant will state understanding/return demonstration of topics presented as noted by education test scores.  Learning Barriers/Preferences:   General Cardiac Education Topics:  AED/CPR: - Group verbal and written instruction with the use of models to demonstrate the basic use of the AED with the basic ABC's of resuscitation.   Anatomy and Cardiac Procedures: - Group verbal and visual presentation and models provide information about basic cardiac anatomy and function. Reviews the testing methods done to diagnose heart disease and the outcomes of the test results. Describes the treatment choices: Medical Management, Angioplasty, or Coronary Bypass Surgery for treating various heart conditions including Myocardial Infarction, Angina, Valve Disease, and Cardiac Arrhythmias.  Written material given at graduation. Flowsheet Row Cardiac Rehab from 03/02/2024 in [facility] Cardiac and Pulmonary Rehab  Education need identified 01/26/24    Medication Safety: - Group verbal  and visual instruction to review commonly prescribed medications for heart and lung disease. Reviews the medication, class of the drug, and side effects. Includes the steps to properly store meds and maintain the prescription regimen.  Written material given at graduation.   Intimacy: - Group verbal instruction through game format to discuss how heart and lung disease can affect sexual intimacy. Written material given at graduation..   Know Your Numbers and Heart Failure: - Group verbal and visual instruction to discuss disease risk factors for cardiac and pulmonary disease and treatment options.  Reviews associated critical values for Overweight/Obesity, Hypertension, Cholesterol, and Diabetes.  Discusses basics of heart failure: signs/symptoms and treatments.  Introduces Heart Failure Zone chart for action plan for heart failure.  Written material given at graduation. Flowsheet Row Cardiac Rehab from 03/02/2024 in [facility] Cardiac and Pulmonary Rehab  Education need identified 01/26/24  Date 03/02/24  Educator sb  Instruction Review Code 1- Verbalizes Understanding    Infection Prevention: - Provides verbal and written  material to individual with discussion of infection control including proper hand washing and proper equipment cleaning during exercise session. Flowsheet Row Cardiac Rehab from 03/02/2024 in [facility] Cardiac and Pulmonary Rehab  Date 01/26/24  Educator MB  Instruction Review Code 1- Verbalizes Understanding    Falls Prevention: - Provides verbal and written material to individual with discussion of falls prevention and safety. Flowsheet Row Cardiac Rehab from 03/02/2024 in [facility] Cardiac and Pulmonary Rehab  Date 01/26/24  Educator MB  Instruction Review Code 1- Verbalizes Understanding    Other: -Provides group and verbal instruction on various topics (see comments)   Knowledge Questionnaire Score:  Knowledge Questionnaire Score - 01/26/24 1038       Knowledge Questionnaire  Score   Pre Score 20/26          Core Components/Risk Factors/Patient Goals at Admission:  Personal Goals and Risk Factors at Admission - 01/26/24 1038       Core Components/Risk Factors/Patient Goals on Admission    Weight Management Yes;Obesity;Weight Loss    Intervention Weight Management: Develop a combined nutrition and exercise program designed to reach desired caloric intake, while maintaining appropriate intake of nutrient and fiber, sodium and fats, and appropriate energy expenditure required for the weight goal.;Weight Management: Provide education and appropriate resources to help participant work on and attain dietary goals.;Weight Management/Obesity: Establish reasonable short term and long term weight goals.;Obesity: Provide education and appropriate resources to help participant work on and attain dietary goals.    Admit Weight 308 lb 4.8 oz (139.8 kg)    Goal Weight: Short Term 274 lb (124.3 kg)    Goal Weight: Long Term 240 lb (108.9 kg)    Expected Outcomes Short Term: Continue to assess and modify interventions until short term weight is achieved;Long Term: Adherence to nutrition and physical activity/exercise program aimed toward attainment of established weight goal;Weight Loss: Understanding of general recommendations for a balanced deficit meal plan, which promotes 1-2 lb weight loss per week and includes a negative energy balance of [phone] kcal/d;Understanding recommendations for meals to include 15-35% energy as protein, 25-35% energy from fat, 35-60% energy from carbohydrates, less than 200mg  of dietary cholesterol, 20-35 gm of total fiber daily;Understanding of distribution of calorie intake throughout the day with the consumption of 4-5 meals/snacks    Diabetes Yes    Intervention Provide education about signs/symptoms and action to take for hypo/hyperglycemia.;Provide education about proper nutrition, including hydration, and aerobic/resistive exercise prescription  along with prescribed medications to achieve blood glucose in normal ranges: Fasting glucose 65-99 mg/dL    Expected Outcomes Short Term: Participant verbalizes understanding of the signs/symptoms and immediate care of hyper/hypoglycemia, proper foot care and importance of medication, aerobic/resistive exercise and nutrition plan for blood glucose control.;Long Term: Attainment of HbA1C < 7%.    Heart Failure Yes    Intervention Provide a combined exercise and nutrition program that is supplemented with education, support and counseling about heart failure. Directed toward relieving symptoms such as shortness of breath, decreased exercise tolerance, and extremity edema.    Expected Outcomes Improve functional capacity of life;Short term: Attendance in program 2-3 days a week with increased exercise capacity. Reported lower sodium intake. Reported increased fruit and vegetable intake. Reports medication compliance.;Short term: Daily weights obtained and reported for increase. Utilizing diuretic protocols set by physician.;Long term: Adoption of self-care skills and reduction of barriers for early signs and symptoms recognition and intervention leading to self-care maintenance.    Hypertension Yes    Intervention Provide education  on lifestyle modifcations including regular physical activity/exercise, weight management, moderate sodium restriction and increased consumption of fresh fruit, vegetables, and low fat dairy, alcohol moderation, and smoking cessation.;Monitor prescription use compliance.    Expected Outcomes Short Term: Continued assessment and intervention until BP is < 140/89mm HG in hypertensive participants. < 130/61mm HG in hypertensive participants with diabetes, heart failure or chronic kidney disease.;Long Term: Maintenance of blood pressure at goal levels.    Lipids Yes    Intervention Provide education and support for participant on nutrition & aerobic/resistive exercise along with  prescribed medications to achieve LDL 70mg , HDL >40mg .    Expected Outcomes Short Term: Participant states understanding of desired cholesterol values and is compliant with medications prescribed. Participant is following exercise prescription and nutrition guidelines.;Long Term: Cholesterol controlled with medications as prescribed, with individualized exercise RX and with personalized nutrition plan. Value goals: LDL < 70mg , HDL > 40 mg.          Education:Diabetes - Individual verbal and written instruction to review signs/symptoms of diabetes, desired ranges of glucose level fasting, after meals and with exercise. Acknowledge that pre and post exercise glucose checks will be done for 3 sessions at entry of program.   Core Components/Risk Factors/Patient Goals Review:    Core Components/Risk Factors/Patient Goals at Discharge (Final Review):    ITP Comments:  ITP Comments     Row Name 01/20/24 1443 01/26/24 1027 02/01/24 1012 02/03/24 1041 03/02/24 1036   ITP Comments Virtual orientation call completed today. he has an appointment on Date: 01/26/2024  for EP eval and gym Orientation.  Documentation of diagnosis can be found in [facility] 12/16/2023 . Completed and gym orientation for cardiac rehab. Initial ITP created and sent for review to Dr. Oneil Pinal, Medical Director. First full day of exercise!  Patient was oriented to gym and equipment including functions, settings, policies, and procedures.  Patient's individual exercise prescription and treatment plan were reviewed.  All starting workloads were established based on the results of the 6 minute walk test done at initial orientation visit.  The plan for exercise progression was also introduced and progression will be customized based on patient's performance and goals. 30 Day review completed. Medical Director ITP review done, changes made as directed, and signed approval by Medical Director.    new to program 30 Day review completed.  Medical Director ITP review done, changes made as directed, and signed approval by Medical Director.      Comments: 30 day review

## 2024-03-02 NOTE — Progress Notes (Signed)
 Daily Session Note  Patient Details  Name: KOSTANTINOS TALLMAN MRN: [id number] Date of Birth: Jul 17, 1976 Referring Provider:   Flowsheet Row Cardiac Rehab from 01/26/2024 in [facility] Cardiac and Pulmonary Rehab  Referring Provider Florencio Kava, MD    Encounter Date: 03/02/2024  Check In:  Session Check In - 03/02/24 0918       Check-In   Supervising physician immediately available to respond to emergencies See telemetry face sheet for immediately available ER MD    Location [facility]-Cardiac & Pulmonary Rehab    Staff Present Burnard Davenport RN,BSN,MPA;Maxon Conetta BS, Exercise Physiologist;Joseph Rolinda NORWOOD HARMAN Hobert Best, MS, Exercise Physiologist    Virtual Visit No    Medication changes reported     No    Fall or balance concerns reported    No    Warm-up and Cool-down Performed on first and last piece of equipment    Resistance Training Performed Yes    VAD Patient? No    PAD/SET Patient? No      Pain Assessment   Currently in Pain? No/denies             Social History   Tobacco Use  Smoking Status Former   Current packs/day: 0.00   Average packs/day: 1.5 packs/day for 34.0 years (51.0 ttl pk-yrs)   Types: Cigarettes   Quit date: 05/2023   Years since quitting: 0.8   Passive exposure: Past  Smokeless Tobacco Never  Tobacco Comments   Quit smoking 05/2023    Goals Met:  Independence with exercise equipment Exercise tolerated well No report of concerns or symptoms today Strength training completed today  Goals Unmet:  Not Applicable  Comments: Pt able to follow exercise prescription today without complaint.  Will continue to monitor for progression.    Dr. Oneil Pinal is Medical Director for [facility] Cardiac Rehabilitation.  Dr. Fuad Aleskerov is Medical Director for [facility] Pulmonary Rehabilitation.

## 2024-03-03 ENCOUNTER — Other Ambulatory Visit: Payer: Self-pay

## 2024-03-03 ENCOUNTER — Telehealth: Payer: Self-pay

## 2024-03-03 DIAGNOSIS — L732 Hidradenitis suppurativa: Secondary | ICD-10-CM

## 2024-03-03 NOTE — Telephone Encounter (Signed)
 Pt called stating that [facility] in [place] is a year wait. He wanted a referral to another facility at [facility].  Faxed referral to Dr. Arland Ito at [phone].

## 2024-03-07 ENCOUNTER — Encounter

## 2024-03-07 DIAGNOSIS — I5022 Chronic systolic (congestive) heart failure: Secondary | ICD-10-CM

## 2024-03-07 NOTE — Progress Notes (Signed)
 Daily Session Note  Patient Details  Name: Oscar [name] MRN: [id number] Date of Birth: 1976-01-05 Referring Provider:   Flowsheet Row Cardiac Rehab from 01/26/2024 in [facility] Cardiac and Pulmonary Rehab  Referring Provider Florencio Kava, MD    Encounter Date: 03/07/2024  Check In:  Session Check In - 03/07/24 0847       Check-In   Supervising physician immediately available to respond to emergencies See telemetry face sheet for immediately available ER MD    Location [facility]-Cardiac & Pulmonary Rehab    Staff Present Burnard Davenport RN,BSN,MPA;Joseph Rolinda RCP,RRT,BSRT;Jason Elnor RDN,LDN;Meredith Tressa RN,BSN    Virtual Visit No    Medication changes reported     No    Fall or balance concerns reported    No    Warm-up and Cool-down Performed on first and last piece of equipment    Resistance Training Performed Yes    VAD Patient? No    PAD/SET Patient? No      Pain Assessment   Currently in Pain? No/denies             Social History   Tobacco Use  Smoking Status Former   Current packs/day: 0.00   Average packs/day: 1.5 packs/day for 34.0 years (51.0 ttl pk-yrs)   Types: Cigarettes   Quit date: 05/2023   Years since quitting: 0.8   Passive exposure: Past  Smokeless Tobacco Never  Tobacco Comments   Quit smoking 05/2023    Goals Met:  Independence with exercise equipment Exercise tolerated well No report of concerns or symptoms today Strength training completed today  Goals Unmet:  Not Applicable  Comments: Pt able to follow exercise prescription today without complaint.  Will continue to monitor for progression.    Dr. Oneil Pinal is Medical Director for [facility] Cardiac Rehabilitation.  Dr. Fuad Aleskerov is Medical Director for [facility] Pulmonary Rehabilitation.

## 2024-03-09 ENCOUNTER — Encounter

## 2024-03-09 DIAGNOSIS — I5022 Chronic systolic (congestive) heart failure: Secondary | ICD-10-CM | POA: Diagnosis not present

## 2024-03-09 NOTE — Progress Notes (Signed)
 Daily Session Note  Patient Details  Name: Oscar [name] MRN: [id number] Date of Birth: 1976/07/08 Referring Provider:   Flowsheet Row Cardiac Rehab from 01/26/2024 in [facility] Cardiac and Pulmonary Rehab  Referring Provider Florencio Kava, MD    Encounter Date: 03/09/2024  Check In:  Session Check In - 03/09/24 0919       Check-In   Supervising physician immediately available to respond to emergencies See telemetry face sheet for immediately available ER MD    Location [facility]-Cardiac & Pulmonary Rehab    Staff Present Burnard Davenport RN,BSN,MPA;Joseph [facility] RCP,RRT,BSRT;Margaret Best, MS, Exercise Physiologist;Jason Elnor [facility]    Virtual Visit No    Medication changes reported     No    Fall or balance concerns reported    No    Warm-up and Cool-down Performed on first and last piece of equipment    Resistance Training Performed Yes    VAD Patient? No    PAD/SET Patient? No      Pain Assessment   Currently in Pain? No/denies             Social History   Tobacco Use  Smoking Status Former   Current packs/day: 0.00   Average packs/day: 1.5 packs/day for 34.0 years (51.0 ttl pk-yrs)   Types: Cigarettes   Quit date: 05/2023   Years since quitting: 0.8   Passive exposure: Past  Smokeless Tobacco Never  Tobacco Comments   Quit smoking 05/2023    Goals Met:  Independence with exercise equipment Exercise tolerated well No report of concerns or symptoms today Strength training completed today  Goals Unmet:  Not Applicable  Comments: Pt able to follow exercise prescription today without complaint.  Will continue to monitor for progression.    Dr. Oneil Pinal is Medical Director for [facility] Cardiac Rehabilitation.  Dr. Fuad Aleskerov is Medical Director for [facility] Pulmonary Rehabilitation.

## 2024-03-14 ENCOUNTER — Telehealth: Payer: Self-pay | Admitting: Family

## 2024-03-14 ENCOUNTER — Encounter

## 2024-03-14 NOTE — Progress Notes (Deleted)
 Advanced Heart Failure Clinic Note    PCP: Antone Bottoms, MD (last seen 01/25) Primary Cardiologist: Florencio Kava, MD / Elodie Ronal Maxwell, NP (last seen 06/25; returns 10/25) HF provider: Bulah Cornet, MD (last seen 05/24)  Chief Complaint: fatigue   HPI:  Oscar [name] is a 48 y/o male with a history of obstructive sleep apnea (currently with CPAP), MI, HTN, VT (successful shock X 3), CKD, COPD, PAF, gout, hyperlipidemia, GERD, IDDM, SVT,asthma, chronic LBBB and chronic heart failure with CRT-D (2016).  Stress test 03/01/2019: Abnormal myocardial perfusion scan no clear evidence of reversible ischemia inferior persistent defect either scar versus body habitus artifact severely dilated left ventricular function severely depressed LV function with ejection fraction of around 11%. Recommend medical therapy   Echo 03/01/2019: EF of 15% along with mild Oscar.  Echo 02/27/20: EF of <20% Echo 11/23/20: EF of <20% along with moderate LVH and moderately elevated PA pressure. Echo 10/20/22: EF <20% along with mildly elevated PA pressure and mild Oscar.   Was in the ED 04/27/23 due to groin abscess X 1 month with drainage. CT done with f/u to be done with surgeon.   Admitted 06/12/23 due to ICD firing after sudden onset of SOB, chest tightness and dizziness. He was able to stand up and make it to the bathroom when he felt his ICD firing again. Afterwards, his shortness of breath, chest pain and dizziness resolved. He called EMS and on route to the ED, he was told that his ICD fired again. Successful conversion after the 3rd shock. Cardiology consulted. Needed amiodarone  drip with transition to or amiodarone .   CPET done 10/09/23 revealed sub-maximal exercise test, so maximal cardiorespiratory capacity could not be determined. Moderate to severe functional impairment appreciated. There were signs of a mixed cardiopulmonary limitation to exercise.   Was in the ED 11/07/23 with right elbow pain. Has a  history of gout. Was given colchicine  but has to wait 3 days before starting because he had been taking it daily for several weeks.  Admitted 12/01/23 with HF/ COPD exacerbation. IV diuresed. Cardiology consulted. IV solu-medrol  given with nebulizer, antibiotics. Given prednisone  taper at discharge. Troponin elevated due to demand ischemia. Echo 12/01/23: EF 30-35% with normal RV, mild Oscar  Was in the ED 12/03/23 with an episode of chest pain when getting in the shower. Also had lightheadedness and shortness of breath. Trponin 80 & then 130. EKG without acute ischemia. SOB improved and he was released.   Admitted 12/04/23 with worsening shortness of breath. Found to be + covid. Given IV lasix  for a/c heart failure. Given IV steroids with transition to oral prednisone  for COPD exacerbation. Had episode of chest pain, improved after giving bronchodilator. Troponin trending down not consistent with ACS.   Seen in [facility] 04/25 and had torsemide  decreased to 20mg  daily after getting BMET back.   Seen in [facility] 06/25 and sent for IV lasix . Amlodipine  and losartan  were both stopped due to hypotension.   He presents today for a HF follow-up visit with a chief complaint of moderate fatigue. Sleeping well. Denies shortness of breath, chest pain, palpitations, abdominal distention, pedal edema, dizziness. At last visit, losartan  was stopped but patient says that he has continued to take this. Saw [facility] cardiology yesterday and referral has been made back to Dr. Debby. Last eye exam was ~ 3 months ago.   He says that he has continued to wear his NIV but that he's having difficulty getting consistent respiratory supplies. Last sleep  study 07/2016.   ROS: All systems negative except as listed in HPI, PMH and Problem List.  SH:  Social History   Socioeconomic History   Marital status: Single    Spouse name: Not on file   Number of children: 0   Years of education: Not on file   Highest education level: Some  college, no degree  Occupational History   Occupation: unemployed  Tobacco Use   Smoking status: Former    Current packs/day: 0.00    Average packs/day: 1.5 packs/day for 34.0 years (51.0 ttl pk-yrs)    Types: Cigarettes    Quit date: 05/2023    Years since quitting: 0.8    Passive exposure: Past   Smokeless tobacco: Never   Tobacco comments:    Quit smoking 05/2023  Vaping Use   Vaping status: Never Used  Substance and Sexual Activity   Alcohol use: No   Drug use: No   Sexual activity: Yes  Other Topics Concern   Not on file  Social History Narrative   Live alone   Social Drivers of Health   Financial Resource Strain: Medium Risk (12/02/2023)   Overall Financial Resource Strain (CARDIA)    Difficulty of Paying Living Expenses: Somewhat hard  Food Insecurity: No Food Insecurity (12/04/2023)   Hunger Vital Sign    Worried About Running Out of Food in the Last Year: Never true    Ran Out of Food in the Last Year: Never true  Recent Concern: Food Insecurity - Food Insecurity Present (11/23/2023)   Received from [facility]   Hunger Vital Sign    Within the past 12 months, you worried that your food would run out before you got the money to buy more.: Sometimes true    Within the past 12 months, the food you bought just didn't last and you didn't have money to get more.: Sometimes true  Transportation Needs: No Transportation Needs (12/04/2023)   PRAPARE - Administrator, Civil Service (Medical): No    Lack of Transportation (Non-Medical): No  Physical Activity: Not on file  Stress: Not on file  Social Connections: Moderately Isolated (12/04/2023)   Social Connection and Isolation Panel    Frequency of Communication with Friends and Family: More than three times a week    Frequency of Social Gatherings with Friends and Family: Once a week    Attends Religious Services: More than 4 times per year    Active Member of [employer] or Organizations: No     Attends Banker Meetings: Never    Marital Status: Never married  Intimate Partner Violence: Not At Risk (12/04/2023)   Humiliation, Afraid, Rape, and Kick questionnaire    Fear of Current or Ex-Partner: No    Emotionally Abused: No    Physically Abused: No    Sexually Abused: No    FH:  Family History  Problem Relation Age of Onset   Hypertension Mother    Congestive Heart Failure Mother    Hypertension Sister    Diabetes Sister    Pancreatitis Sister    COPD Sister    Emphysema Sister        smoked   Pancreatitis Brother    Anemia Neg Hx    Arrhythmia Neg Hx    Asthma Neg Hx    Clotting disorder Neg Hx    Fainting Neg Hx    Heart attack Neg Hx    Heart disease Neg Hx  Heart failure Neg Hx    Hyperlipidemia Neg Hx     Past Medical History:  Diagnosis Date   AICD (automatic cardioverter/defibrillator) present    Aortic atherosclerosis (HCC)    Asthma    Chronic respiratory failure (HCC)    CKD (chronic kidney disease), stage III (HCC)    COPD (chronic obstructive pulmonary disease) (HCC)    Deafness in right ear    Diabetes mellitus without complication (HCC)    Dyspnea    GERD (gastroesophageal reflux disease)    Gout    HFrEF (heart failure with reduced ejection fraction) (HCC)    a.) TTE 12/10/14: EF 25%; diff inf HK; sev LV and mod LA dil; LVH. b.) TTE 06/23/18: EF 20-25%; LVH; mild LV dil, mild BAE. c.) TTE 03/01/19: EF 15%, LVH, BAE; triv PR/TR. d.) TTE 02/27/20: EF < 20%; sev LV dil; mild Oscar. e.) TTE 11/23/20: EF < 20%; glob HK; sev LV dil; LVH; mild-mod BAE; mod-sev TR, triv AR; G1DD. f.) TTE 05/14/21: EF <15%; LVH; sev LA and mild RV enlar; triv AR/PR, mild TR, mod Oscar.   Hiatal hernia    History of cardiac catheterization    a.) R/LHC 12/14/2009: normal coronaries. b.) R/LHC 12/11/2014: normal coronaries.   Hyperlipidemia    Hypertension    Hypoxemia    LBBB (left bundle branch block)    NICM (nonischemic cardiomyopathy; dilated cardiomyopathy)  (HCC)    a.) R/LHC 12/14/2009: normal cors; LVEDP 18 mmHg, mean PA 29 mmHg, mean PCWP 31 mmHg; CO 6 L/min; CI 2.54 L/min/m. b.) TTE 12/10/2014: EF 25%. c.) R/LHC 12/11/2014: mean RA 9 mmHg, mean PA 22 mmHg, mean PCWP 22 mmHg. d.) TTE 06/23/2018: EF 20-25%. e.) TTE 03/01/2019: EF 15%. f.) TTE 02/27/2020: < 20%. g.) TTE 11/23/2020: EF <20%. h.) TTE 05/14/2021: < 15%.   NSTEMI (non-ST elevated myocardial infarction) (HCC)    a.) x 3 per patient report ---> 2012, 2014, 2016   Obesity    On supplemental oxygen by nasal cannula    a.) 2-3 L/[place] PRN   OSA on CPAP    PAF (paroxysmal atrial fibrillation) (HCC)    a.) CHA2DS2-VASc = 4 (HFrEF, HTN, prior MI, T2DM). b.) rate/rhythm maintained without pharmacologial intervention; no current anticoagulation.   Pancreatitis    PSVT (paroxysmal supraventricular tachycardia) (HCC)     Current Outpatient Medications  Medication Sig Dispense Refill   albuterol  (VENTOLIN  HFA) 108 (90 Base) MCG/ACT inhaler Inhale 2 puffs into the lungs every 6 (six) hours as needed for wheezing or shortness of breath. 8 g 2   allopurinol  (ZYLOPRIM ) 100 MG tablet Take 100 mg by mouth daily.     amiodarone  (PACERONE ) 200 MG tablet Take 1 tablet (200 mg total) by mouth daily.     atorvastatin  (LIPITOR ) 80 MG tablet Take 1 tablet (80 mg total) by mouth at bedtime. 90 tablet 3   colchicine  0.6 MG tablet Take 0.6 mg by mouth daily.     dapagliflozin  propanediol (FARXIGA ) 10 MG TABS tablet Take 1 tablet (10 mg total) by mouth daily. 30 tablet 2   doxycycline  (ADOXA) 100 MG tablet Take 100 mg by mouth 2 (two) times daily.     Fluticasone -Umeclidin-Vilant (TRELEGY ELLIPTA ) 100-62.5-25 MCG/ACT AEPB Inhale 1 puff into the lungs daily. 60 each 11   metoprolol  succinate (TOPROL -XL) 25 MG 24 hr tablet Take 1 tablet (25 mg total) by mouth daily. Take with or immediately following a meal. 30 tablet 11   midodrine  (PROAMATINE ) 5 MG tablet  Take 1 tablet (5 mg total) by mouth 3 (three) times daily  as needed. If bp top number is lower than 100 90 tablet 5   NON FORMULARY Pt uses a cpap nightly     pantoprazole  (PROTONIX ) 40 MG tablet Take 1 tablet (40 mg total) by mouth daily.     Torsemide  40 MG TABS Take 40 mg by mouth daily for 3 days. 3 tablet 0   No current facility-administered medications for this visit.   There were no vitals filed for this visit.  Wt Readings from Last 3 Encounters:  02/24/24 (!) 309 lb (140.2 kg)  02/17/24 (!) 303 lb 9.6 oz (137.7 kg)  02/04/24 (!) 312 lb (141.5 kg)   Lab Results  Component Value Date   CREATININE 1.83 (H) 02/24/2024   CREATININE 1.77 (H) 02/04/2024   CREATININE 1.78 (H) 02/03/2024    PHYSICAL EXAM:  General: Well appearing. No resp difficulty HEENT: normal Neck: supple, no JVD Cor: Regular rhythm, rate. No rubs, gallops or murmurs Lungs: clear Abdomen: soft, nontender, nondistended. Extremities: no cyanosis, clubbing, rash, edema Neuro: alert & oriented X 3. Moves all 4 extremities w/o difficulty. Affect pleasant   ECG: not done   ASSESSMENT & PLAN:  1: Chronic heart failure with reduced ejection fraction- - NYHA [name] III - euvolemic - weighing daily; Reminded to call for an overnight weight gain of >2 pounds or a weekly weight gain of >5 pounds.  - weight down 3 pounds from last visit here 3 weeks ago - encouraged continued fluid restriction of no more than 2L - Stress test 03/01/2019:   Abnormal myocardial perfusion scan no clear evidence of reversible ischemia inferior persistent defect either scar versus body habitus artifact severely dilated left ventricular function severely depressed LV function with ejection fraction of around 11%.   - Echo 03/01/2019: EF of 15% along with mild Oscar.  - Echo 02/27/20: EF of <20% - Echo 11/23/20: EF of <20% along with moderate LVH and moderately elevated PA pressure. - Echo 10/20/22: EF <20% along with mildly elevated PA pressure and mild Oscar.  - Echo 12/01/23: EF 30-35% with normal  RV, mild Oscar - AICD (2016) in place but denies any shocks in a long time - continue farxiga  10mg  daily  - continue metoprolol  succinate 25mg  daily  - continue torsemide  40mg  daily - losartan  stopped 02/03/24 but patient has continued to take this; losartan  bottle was removed from patient's medication bag - in the past, spironolactone  has caused hypotension - saw HF provider Pearl) 05/24 - BNP 02/04/24 was 357.2 - BMET  2: HTN with CKD- - BP 99/74; removed losartan  bottle from patient medication bag today - amlodipine  stopped 02/03/24 - continue midodrine  5mg  TID if SBP <100 - sees PCP (Mancheno) at [facility]  - BMP 02/04/24 reviewed: sodium 137, potassium 4.5, Cr 1.77 and GFR 47 - BMET today - saw nephrology Kolleen) 05/25  3: Obstructive sleep apnea- - wears nocturnal ventilator QHS, ~ 6-8 hours/night; reports sleeping well - continues wearing oxygen at 3L at bedtime & PRN during the day - saw pulmonology Gayl) 04/25 - advised to f/u with pulmonology about getting sleep study updated so that he can get consistent supplies  4: Diabetes- - A1c 12/03/23 was 8.5%  5: Atrial fibrillation- - saw cardiology Debby) 06/25 - currently taking amiodarone  200mg  daily  - does not want to resume apixaban ; aware of stroke risk - last eye exam ~ 03/25 - TSH today  6: VT- - Mg  12/06/23 reviewed and was 2.3 - 8 months ICD estimated battery longevity. Referred to Dr. Debby, per [facility] cardiology, to evaluate for CRT-D upgrade with HFrEF and LBBB.    7: Hyperlipidemia- - LDL 02/04/24 was 194 - lipid panel today now that he's consistently taking his statin.  - continue atorvastatin  40mg  daily   Return in 5 months, sooner if needed.  Oscar DELENA Class, FNP 03/14/24

## 2024-03-14 NOTE — Telephone Encounter (Signed)
 Called to confirm/remind patient of their appointment at the Advanced Heart Failure Clinic on 03/15/24.   Appointment:   [] Confirmed  [x] Left mess   [] No answer/No voice mail  [] VM Full/unable to leave message  [] Phone not in service  Patient reminded to bring all medications and/or complete list.  Confirmed patient has transportation. Gave directions, instructed to utilize valet parking.

## 2024-03-15 ENCOUNTER — Telehealth: Payer: Self-pay | Admitting: Family

## 2024-03-15 ENCOUNTER — Encounter: Admitting: Family

## 2024-03-15 NOTE — Telephone Encounter (Signed)
 Patient did not show for his Heart Failure Clinic appointment on 03/15/24. This is the 11th appointment that he has missed.

## 2024-03-16 ENCOUNTER — Encounter

## 2024-03-21 ENCOUNTER — Encounter

## 2024-03-21 DIAGNOSIS — I5022 Chronic systolic (congestive) heart failure: Secondary | ICD-10-CM

## 2024-03-21 NOTE — Progress Notes (Signed)
 Daily Session Note  Patient Details  Name: Oscar [name] MRN: [id number] Date of Birth: May 28, 1976 Referring Provider:   Flowsheet Row Cardiac Rehab from 01/26/2024 in [facility] Cardiac [facility] Pul[facility]  Referring Provider Florencio Kava, MD    Encounter Date: 03/21/2024  Check In:  Session Check In - 03/21/24 0910       Check-In   Supervising physician immediately available to respond to emergencies See telemetry face sheet for immediately available ER MD    Location [facility]-Cardiac & Pulmonary Rehab    Staff Present Burnard Davenport RN,BSN,MPA;Maxon Burnell BS, Exercise Physiologist;[facility] Dyane BS, ACSM CEP, Exercise Physiologist    Virtual Visit No    Medication changes reported     No    Fall or balance concerns reported    No    Warm-up and Cool-down Performed on first and last piece of equipment    Resistance Training Performed Yes    VAD Patient? No    PAD/SET Patient? No      Pain Assessment   Currently in Pain? No/denies             Social History   Tobacco Use  Smoking Status Former   Current packs/day: 0.00   Average packs/day: 1.5 packs/day for 34.0 years (51.0 ttl pk-yrs)   Types: Cigarettes   Quit date: 05/2023   Years since quitting: 0.8   Passive exposure: Past  Smokeless Tobacco Never  Tobacco Comments   Quit smoking 05/2023    Goals Met:  Independence with exercise equipment Exercise tolerated well No report of concerns or symptoms today Strength training completed today  Goals Unmet:  Not Applicable  Comments: Pt able to follow exercise prescription today. Pt stated he wasn't feeling well due to extra fluid he has and is leaving  a little early to go home and take his fluid pill.  Will continue to monitor for progression.    Dr. Oneil Pinal is Medical Director for [facility] Cardiac Rehabilitation.  Dr. Fuad Aleskerov is Medical Director for [facility] Pulmonary Rehabilitation.

## 2024-03-23 ENCOUNTER — Encounter

## 2024-03-23 ENCOUNTER — Ambulatory Visit: Admitting: Pulmonary Disease

## 2024-03-24 ENCOUNTER — Telehealth: Payer: Self-pay | Admitting: Pulmonary Disease

## 2024-03-24 NOTE — Telephone Encounter (Signed)
 We have not received an order for this patient.  I have sent a message to Patricia/Brad/Mitch with [employer] to see if they can refax the order.

## 2024-03-24 NOTE — Telephone Encounter (Signed)
 Per message from [place] with [employer]- Hello Evalene,  I will have to add Mitch one this. I sent him an email with your request below.  I do not see anything regarding this in the account.  Thanks,  [employer]

## 2024-03-24 NOTE — Telephone Encounter (Signed)
 Copied from CRM [id number]. Topic: Clinical - Prescription Issue >> Mar 24, 2024  3:19 PM Corean SAUNDERS wrote: Reason for CRM: Patient states [employer] has sent over a request for his Bipap machine and is requesting the provider to please sign and fax it back. We haven't received anything from [employer] for this patient.

## 2024-03-25 NOTE — Telephone Encounter (Signed)
 Per Mitch : Oscar [name], I sent that order to [facility] yesterday. She replied this morning saying she printed it and put it in Dr. Evalyn box.

## 2024-03-25 NOTE — Telephone Encounter (Signed)
 On the recent items that were on my folder to sign there was no order for Oscar [name].  I have also signed orders previously on this patient.  Also Oscar [name] was supposed to follow-up with me this week and did not do so.  I will be happy to sign it AGAIN if it is put on my folder.

## 2024-03-28 ENCOUNTER — Encounter

## 2024-03-29 ENCOUNTER — Ambulatory Visit: Admitting: Podiatry

## 2024-03-29 NOTE — Telephone Encounter (Signed)
 Order has been signed and faxed back.  Nothing further needed.

## 2024-03-30 ENCOUNTER — Encounter

## 2024-03-30 DIAGNOSIS — I5022 Chronic systolic (congestive) heart failure: Secondary | ICD-10-CM

## 2024-03-30 NOTE — Progress Notes (Signed)
 Cardiac Individual Treatment Plan  Patient Details  Name: Oscar [name] MRN: [id number] Date of Birth: 12-Nov-1975 Referring Provider:   Flowsheet Row Cardiac Rehab from 01/26/2024 in [facility] Cardiac and Pulmonary Rehab  Referring Provider Florencio Kava, MD    Initial Encounter Date:  Flowsheet Row Cardiac Rehab from 01/26/2024 in [facility] Cardiac and Pulmonary Rehab  Date 01/26/24    Visit Diagnosis: Chronic systolic heart failure (HCC)  Patient's Home Medications on Admission:  Current Outpatient Medications:    albuterol  (VENTOLIN  HFA) 108 (90 Base) MCG/ACT inhaler, Inhale 2 puffs into the lungs every 6 (six) hours as needed for wheezing or shortness of breath., Disp: 8 g, Rfl: 2   allopurinol  (ZYLOPRIM ) 100 MG tablet, Take 100 mg by mouth daily., Disp: , Rfl:    amiodarone  (PACERONE ) 200 MG tablet, Take 1 tablet (200 mg total) by mouth daily., Disp: , Rfl:    atorvastatin  (LIPITOR ) 80 MG tablet, Take 1 tablet (80 mg total) by mouth at bedtime., Disp: 90 tablet, Rfl: 3   colchicine  0.6 MG tablet, Take 0.6 mg by mouth daily., Disp: , Rfl:    dapagliflozin  propanediol (FARXIGA ) 10 MG TABS tablet, Take 1 tablet (10 mg total) by mouth daily., Disp: 30 tablet, Rfl: 2   doxycycline  (ADOXA) 100 MG tablet, Take 100 mg by mouth 2 (two) times daily., Disp: , Rfl:    Fluticasone -Umeclidin-Vilant (TRELEGY ELLIPTA ) 100-62.5-25 MCG/ACT AEPB, Inhale 1 puff into the lungs daily., Disp: 60 each, Rfl: 11   metoprolol  succinate (TOPROL -XL) 25 MG 24 hr tablet, Take 1 tablet (25 mg total) by mouth daily. Take with or immediately following a meal., Disp: 30 tablet, Rfl: 11   midodrine  (PROAMATINE ) 5 MG tablet, Take 1 tablet (5 mg total) by mouth 3 (three) times daily as needed. If bp top number is lower than 100, Disp: 90 tablet, Rfl: 5   NON FORMULARY, Pt uses a cpap nightly, Disp: , Rfl:    pantoprazole  (PROTONIX ) 40 MG tablet, Take 1 tablet (40 mg total) by mouth daily., Disp: , Rfl:    Torsemide  40 MG  TABS, Take 40 mg by mouth daily for 3 days., Disp: 3 tablet, Rfl: 0  Past Medical History: Past Medical History:  Diagnosis Date   AICD (automatic cardioverter/defibrillator) present    Aortic atherosclerosis (HCC)    Asthma    Chronic respiratory failure (HCC)    CKD (chronic kidney disease), stage III (HCC)    COPD (chronic obstructive pulmonary disease) (HCC)    Deafness in right ear    Diabetes mellitus without complication (HCC)    Dyspnea    GERD (gastroesophageal reflux disease)    Gout    HFrEF (heart failure with reduced ejection fraction) (HCC)    a.) TTE 12/10/14: EF 25%; diff inf HK; sev LV and mod LA dil; LVH. b.) TTE 06/23/18: EF 20-25%; LVH; mild LV dil, mild BAE. c.) TTE 03/01/19: EF 15%, LVH, BAE; triv PR/TR. d.) TTE 02/27/20: EF < 20%; sev LV dil; mild MR. e.) TTE 11/23/20: EF < 20%; glob HK; sev LV dil; LVH; mild-mod BAE; mod-sev TR, triv AR; G1DD. f.) TTE 05/14/21: EF <15%; LVH; sev LA and mild RV enlar; triv AR/PR, mild TR, mod MR.   Hiatal hernia    History of cardiac catheterization    a.) R/LHC 12/14/2009: normal coronaries. b.) R/LHC 12/11/2014: normal coronaries.   Hyperlipidemia    Hypertension    Hypoxemia    LBBB (left bundle branch block)    NICM (nonischemic  cardiomyopathy; dilated cardiomyopathy) (HCC)    a.) R/LHC 12/14/2009: normal cors; LVEDP 18 mmHg, mean PA 29 mmHg, mean PCWP 31 mmHg; CO 6 L/min; CI 2.54 L/min/m. b.) TTE 12/10/2014: EF 25%. c.) R/LHC 12/11/2014: mean RA 9 mmHg, mean PA 22 mmHg, mean PCWP 22 mmHg. d.) TTE 06/23/2018: EF 20-25%. e.) TTE 03/01/2019: EF 15%. f.) TTE 02/27/2020: < 20%. g.) TTE 11/23/2020: EF <20%. h.) TTE 05/14/2021: < 15%.   NSTEMI (non-ST elevated myocardial infarction) (HCC)    a.) x 3 per patient report ---> 2012, 2014, 2016   Obesity    On supplemental oxygen by nasal cannula    a.) 2-3 L/ PRN   OSA on CPAP    PAF (paroxysmal atrial fibrillation) (HCC)    a.) CHA2DS2-VASc = 4 (HFrEF, HTN, prior MI, T2DM). b.)  rate/rhythm maintained without pharmacologial intervention; no current anticoagulation.   Pancreatitis    PSVT (paroxysmal supraventricular tachycardia) (HCC)     Tobacco Use: Social History   Tobacco Use  Smoking Status Former   Current packs/day: 0.00   Average packs/day: 1.5 packs/day for 34.0 years (51.0 ttl pk-yrs)   Types: Cigarettes   Quit date: 05/2023   Years since quitting: 0.9   Passive exposure: Past  Smokeless Tobacco Never  Tobacco Comments   Quit smoking 05/2023    Labs: Review Flowsheet  More data exists      Latest Ref Rng & Units 06/13/2023 12/03/2023 12/04/2023 02/04/2024 02/24/2024  Labs for ITP Cardiac and Pulmonary Rehab  Cholestrol 0 - 200 mg/dL - - - 743  787   LDL (calc) 0 - 99 mg/dL - - - 805  842   HDL-C >40 mg/dL - - - 41  38   Trlycerides <150 mg/dL - - - 896  87   Hemoglobin A1c 4.8 - 5.6 % 7.3  8.5  - - -  Bicarbonate 20.0 - 28.0 mmol/L - - 29.2  - -  O2 Saturation % - - 89.3  - -     Exercise Target Goals: Exercise Program Goal: Individual exercise prescription set using results from initial 6 min walk test and THRR while considering  patient's activity barriers and safety.   Exercise Prescription Goal: Initial exercise prescription builds to 30-45 minutes a day of aerobic activity, 2-3 days per week.  Home exercise guidelines will be given to patient during program as part of exercise prescription that the participant will acknowledge.   Education: Aerobic Exercise: - Group verbal and visual presentation on the components of exercise prescription. Introduces F.I.T.T principle from ACSM for exercise prescriptions.  Reviews F.I.T.T. principles of aerobic exercise including progression. Written material given at graduation. Flowsheet Row Cardiac Rehab from 03/09/2024 in [facility] Cardiac and Pulmonary Rehab  Education need identified 01/26/24    Education: Resistance Exercise: - Group verbal and visual presentation on the components of exercise  prescription. Introduces F.I.T.T principle from ACSM for exercise prescriptions  Reviews F.I.T.T. principles of resistance exercise including progression. Written material given at graduation. Flowsheet Row Cardiac Rehab from 03/09/2024 in [facility] Cardiac and Pulmonary Rehab  Education need identified 01/26/24     Education: Exercise & Equipment Safety: - Individual verbal instruction and demonstration of equipment use and safety with use of the equipment. Flowsheet Row Cardiac Rehab from 03/09/2024 in [facility] Cardiac and Pulmonary Rehab  Date 01/26/24  Educator MB  Instruction Review Code 1- Verbalizes Understanding    Education: Exercise Physiology & General Exercise Guidelines: - Group verbal and written instruction with models to  review the exercise physiology of the cardiovascular system and associated critical values. Provides general exercise guidelines with specific guidelines to those with heart or lung disease.  Flowsheet Row Cardiac Rehab from 03/09/2024 in [facility] Cardiac and Pulmonary Rehab  Education need identified 01/26/24    Education: Flexibility, Balance, Mind/Body Relaxation: - Group verbal and visual presentation with interactive activity on the components of exercise prescription. Introduces F.I.T.T principle from ACSM for exercise prescriptions. Reviews F.I.T.T. principles of flexibility and balance exercise training including progression. Also discusses the mind body connection.  Reviews various relaxation techniques to help reduce and manage stress (i.e. Deep breathing, progressive muscle relaxation, and visualization). Balance handout provided to take home. Written material given at graduation.   Activity Barriers & Risk Stratification:  Activity Barriers & Cardiac Risk Stratification - 01/26/24 1028       Activity Barriers & Cardiac Risk Stratification   Activity Barriers Joint Problems;Other (comment)    Comments Elbow joint, gout in both feet    Cardiac Risk Stratification  High          6 Minute Walk:  6 Minute Walk     Row Name 01/26/24 1027         6 Minute Walk   Phase Initial     Distance 800 feet     Walk Time 6 minutes     # of Rest Breaks 0     MPH 1.52     METS 2.06     RPE 11     Perceived Dyspnea  1     VO2 Peak 7.21     Symptoms No     Resting HR 60 bpm     Resting BP 104/70     Resting Oxygen Saturation  98 %     Exercise Oxygen Saturation  during 6 min walk 97 %     Max Ex. HR 100 bpm     Max Ex. BP 118/80     2 Minute Post BP 100/70        Oxygen Initial Assessment:  Oxygen Initial Assessment - 01/20/24 1430       Home Oxygen   Home Oxygen Device Home Concentrator;E-Tanks    Sleep Oxygen Prescription Continuous;CPAP    Liters per minute 3    Home Exercise Oxygen Prescription Continuous    Liters per minute 3   when winded, 2 l when not winded   Home Resting Oxygen Prescription Continuous    Liters per minute 2    Compliance with Home Oxygen Use Yes      Intervention   Short Term Goals To learn and exhibit compliance with exercise, home and travel O2 prescription;To learn and understand importance of monitoring SPO2 with pulse oximeter and demonstrate accurate use of the pulse oximeter.;To learn and understand importance of maintaining oxygen saturations>88%;To learn and demonstrate proper pursed lip breathing techniques or other breathing techniques. ;To learn and demonstrate proper use of respiratory medications    Long  Term Goals Exhibits compliance with exercise, home  and travel O2 prescription;Verbalizes importance of monitoring SPO2 with pulse oximeter and return demonstration;Maintenance of O2 saturations>88%;Exhibits proper breathing techniques, such as pursed lip breathing or other method taught during program session;Compliance with respiratory medication;Demonstrates proper use of MDI's          Oxygen Re-Evaluation:  Oxygen Re-Evaluation     Row Name 02/01/24 1014             Home Oxygen    Home Oxygen Device  Home Concentrator;E-Tanks       Sleep Oxygen Prescription Continuous;CPAP       Liters per minute 3       Home Exercise Oxygen Prescription Continuous       Liters per minute 3       Home Resting Oxygen Prescription Continuous       Liters per minute 2       Compliance with Home Oxygen Use Yes         Goals/Expected Outcomes   Short Term Goals To learn and exhibit compliance with exercise, home and travel O2 prescription;To learn and understand importance of monitoring SPO2 with pulse oximeter and demonstrate accurate use of the pulse oximeter.;To learn and understand importance of maintaining oxygen saturations>88%;To learn and demonstrate proper pursed lip breathing techniques or other breathing techniques. ;To learn and demonstrate proper use of respiratory medications       Long  Term Goals Exhibits compliance with exercise, home  and travel O2 prescription;Verbalizes importance of monitoring SPO2 with pulse oximeter and return demonstration;Maintenance of O2 saturations>88%;Exhibits proper breathing techniques, such as pursed lip breathing or other method taught during program session;Compliance with respiratory medication;Demonstrates proper use of MDI's       Comments Reviewed RPE and dyspnea scale, THR and program prescription with pt today.  Pt voiced understanding and was given a copy of goals to take home.       Goals/Expected Outcomes Short: Use RPE daily to regulate intensity. Long: Follow program prescription in THR.          Oxygen Discharge (Final Oxygen Re-Evaluation):  Oxygen Re-Evaluation - 02/01/24 1014       Home Oxygen   Home Oxygen Device Home Concentrator;E-Tanks    Sleep Oxygen Prescription Continuous;CPAP    Liters per minute 3    Home Exercise Oxygen Prescription Continuous    Liters per minute 3    Home Resting Oxygen Prescription Continuous    Liters per minute 2    Compliance with Home Oxygen Use Yes      Goals/Expected Outcomes   Short  Term Goals To learn and exhibit compliance with exercise, home and travel O2 prescription;To learn and understand importance of monitoring SPO2 with pulse oximeter and demonstrate accurate use of the pulse oximeter.;To learn and understand importance of maintaining oxygen saturations>88%;To learn and demonstrate proper pursed lip breathing techniques or other breathing techniques. ;To learn and demonstrate proper use of respiratory medications    Long  Term Goals Exhibits compliance with exercise, home  and travel O2 prescription;Verbalizes importance of monitoring SPO2 with pulse oximeter and return demonstration;Maintenance of O2 saturations>88%;Exhibits proper breathing techniques, such as pursed lip breathing or other method taught during program session;Compliance with respiratory medication;Demonstrates proper use of MDI's    Comments Reviewed RPE and dyspnea scale, THR and program prescription with pt today.  Pt voiced understanding and was given a copy of goals to take home.    Goals/Expected Outcomes Short: Use RPE daily to regulate intensity. Long: Follow program prescription in THR.          Initial Exercise Prescription:  Initial Exercise Prescription - 01/26/24 1000       Date of Initial Exercise RX and Referring Provider   Date 01/26/24    Referring Provider Florencio Kava, MD      Oxygen   Oxygen Continuous    Liters 2-3L    Maintain Oxygen Saturation 88% or higher      Treadmill   MPH 1.4  Grade 0    Minutes 15    METs 2.07      Recumbant Bike   Level 2    RPM 50    Watts 25    Minutes 15    METs 2.06      NuStep   Level 2    SPM 80    Minutes 15    METs 2.06      T5 Nustep   Level 2    SPM 80    Minutes 15    METs 2.06      Biostep-RELP   Level 2    SPM 50    Minutes 15    METs 2.06      Prescription Details   Frequency (times per week) 2    Duration Progress to 30 minutes of continuous aerobic without signs/symptoms of physical distress       Intensity   THRR 40-80% of Max Heartrate 104-149    Ratings of Perceived Exertion 11-13    Perceived Dyspnea 0-4      Progression   Progression Continue to progress workloads to maintain intensity without signs/symptoms of physical distress.      Resistance Training   Training Prescription Yes    Weight 3 lb, 5 lb   3 lb (R), 5 lb (L)   Reps 10-15          Perform Capillary Blood Glucose checks as needed.  Exercise Prescription Changes:   Exercise Prescription Changes     Row Name 01/26/24 1000 02/18/24 1600 03/15/24 1100 03/29/24 0800       Response to Exercise   Blood Pressure (Admit) 104/70 108/72 136/70 100/62    Blood Pressure (Exercise) 118/80 128/80 118/70 --    Blood Pressure (Exit) 100/70 118/74 118/62 90/68    Heart Rate (Admit) 60 bpm 81 bpm 70 bpm 55 bpm    Heart Rate (Exercise) 100 bpm 105 bpm 107 bpm 94 bpm    Heart Rate (Exit) 60 bpm 75 bpm 86 bpm 81 bpm    Oxygen Saturation (Admit) 98 % -- -- --    Oxygen Saturation (Exercise) 97 % -- -- --    Oxygen Saturation (Exit) 97 % -- -- --    Rating of Perceived Exertion (Exercise) 11 11 14 12     Perceived Dyspnea (Exercise) 1 1 0 --    Symptoms none none none none    Comments results first 2 weeks of exercise -- --    Duration -- Progress to 30 minutes of  aerobic without signs/symptoms of physical distress Progress to 30 minutes of  aerobic without signs/symptoms of physical distress Progress to 30 minutes of  aerobic without signs/symptoms of physical distress    Intensity THRR New THRR unchanged THRR unchanged THRR unchanged      Progression   Progression -- Continue to progress workloads to maintain intensity without signs/symptoms of physical distress. Continue to progress workloads to maintain intensity without signs/symptoms of physical distress. Continue to progress workloads to maintain intensity without signs/symptoms of physical distress.    Average METs 2.06 5.46 1.97 2      Resistance  Training   Training Prescription -- Yes Yes Yes    Weight -- 3 lb, 5 lb  3 lb (R), 5 lb (L) 3 lb, 5 lb  3 lb (R), 5 lb (L) 0  Did not complete any resistance training since last review    Reps -- 10-15 10-15 10-15  Interval Training   Interval Training -- No No No      Oxygen   Oxygen -- Continuous Continuous Continuous    Liters -- 3L 3L 3L      Treadmill   MPH -- 1 1.3 1.3    Grade -- 0 0 0    Minutes -- 15 15 15     METs -- 1.77 1.99 1.99      Recumbant Bike   Level -- -- 3 --    Watts -- -- 25 --    Minutes -- -- 15 --    METs -- -- 2.56 --      T5 Nustep   Level -- 2 3 --    Minutes -- 15 15 --    METs -- 2 2 --      Biostep-RELP   Level -- -- -- 1    Minutes -- -- -- 15    METs -- -- -- 2      Oxygen   Maintain Oxygen Saturation -- 88% or higher 88% or higher 88% or higher       Exercise Comments:   Exercise Comments     Row Name 02/01/24 1012 03/21/24 0944         Exercise Comments First full day of exercise!  Patient was oriented to gym and equipment including functions, settings, policies, and procedures.  Patient's individual exercise prescription and treatment plan were reviewed.  All starting workloads were established based on the results of the 6 minute walk test done at initial orientation visit.  The plan for exercise progression was also introduced and progression will be customized based on patient's performance and goals. Pt able to follow exercise prescription today. Pt stated he wasn't feeling well due to extra fluid he has and is leaving  a little early to go home and take his fluid pill.  Will continue to monitor for progression.         Exercise Goals and Review:   Exercise Goals     Row Name 01/26/24 1036             Exercise Goals   Increase Physical Activity Yes       Intervention Provide advice, education, support and counseling about physical activity/exercise needs.;Develop an individualized exercise prescription for  aerobic and resistive training based on initial evaluation findings, risk stratification, comorbidities and participant's personal goals.       Expected Outcomes Short Term: Attend rehab on a regular basis to increase amount of physical activity.;Long Term: Add in home exercise to make exercise part of routine and to increase amount of physical activity.;Long Term: Exercising regularly at least 3-5 days a week.       Increase Strength and Stamina Yes       Intervention Provide advice, education, support and counseling about physical activity/exercise needs.;Develop an individualized exercise prescription for aerobic and resistive training based on initial evaluation findings, risk stratification, comorbidities and participant's personal goals.       Expected Outcomes Short Term: Increase workloads from initial exercise prescription for resistance, speed, and METs.;Short Term: Perform resistance training exercises routinely during rehab and add in resistance training at home;Long Term: Improve cardiorespiratory fitness, muscular endurance and strength as measured by increased METs and functional capacity ( )       Able to understand and use rate of perceived exertion (RPE) scale Yes       Intervention Provide education and explanation on how to use RPE scale  Expected Outcomes Short Term: Able to use RPE daily in rehab to express subjective intensity level;Long Term:  Able to use RPE to guide intensity level when exercising independently       Able to understand and use Dyspnea scale Yes       Intervention Provide education and explanation on how to use Dyspnea scale       Expected Outcomes Short Term: Able to use Dyspnea scale daily in rehab to express subjective sense of shortness of breath during exertion;Long Term: Able to use Dyspnea scale to guide intensity level when exercising independently       Knowledge and understanding of Target Heart Rate Range (THRR) Yes       Intervention Provide  education and explanation of THRR including how the numbers were predicted and where they are located for reference       Expected Outcomes Short Term: Able to state/look up THRR;Short Term: Able to use daily as guideline for intensity in rehab;Long Term: Able to use THRR to govern intensity when exercising independently       Able to check pulse independently Yes       Intervention Provide education and demonstration on how to check pulse in carotid and radial arteries.;Review the importance of being able to check your own pulse for safety during independent exercise       Expected Outcomes Short Term: Able to explain why pulse checking is important during independent exercise;Long Term: Able to check pulse independently and accurately       Understanding of Exercise Prescription Yes       Intervention Provide education, explanation, and written materials on patient's individual exercise prescription       Expected Outcomes Short Term: Able to explain program exercise prescription;Long Term: Able to explain home exercise prescription to exercise independently          Exercise Goals Re-Evaluation :  Exercise Goals Re-Evaluation     Row Name 02/18/24 1629 03/01/24 1337 03/15/24 1103 03/29/24 0830       Exercise Goal Re-Evaluation   Exercise Goals Review Increase Physical Activity;Increase Strength and Stamina;Understanding of Exercise Prescription Increase Physical Activity;Increase Strength and Stamina;Understanding of Exercise Prescription Increase Physical Activity;Increase Strength and Stamina;Understanding of Exercise Prescription Increase Physical Activity;Increase Strength and Stamina;Understanding of Exercise Prescription    Comments Jerico is off to a good start in the program. He was able to attend his first 2 sessions during this review period. During these sessions he was able to use the treadmill at 1 mph and no incline, and the T5 nustep at level 2. We will continue to monitor his  progress in the program. Cotey has not attended rehab since the last review. We will contact him to see when he plans to return to regular attendance in the program. We will continue to monitor his progress when he returns to rehab. Blase is doing well in rehab. He has been able to increase his speed on the treadmill from 1 to 1.86mph. He has also been able to increase to level 3 on both the T5 nustep and recumbent bike. We will continue to monitor his progress in the program. Jemmie has only attended one session since the last review. He was able to walk the treadmill at a speed of 1.3 mph with no incline. He also worked at level 1 on the biostep, but did not do any resistance training due to him not feeling well and leaving early. We will continue to monitor his progress  in the program.    Expected Outcomes Short: Continue to follow exercise prescription. Long: Continue exercise to improve strength and stamina. Short: Return to rehab when appropriate. Long: Graduate from the program. Short: Continue to increase treadmill workload. Long: Continue exercise to improve strength and stamina. Short: Attend rehab more consistently. Long: Continue exercise to improve strength and stamina.       Discharge Exercise Prescription (Final Exercise Prescription Changes):  Exercise Prescription Changes - 03/29/24 0800       Response to Exercise   Blood Pressure (Admit) 100/62    Blood Pressure (Exit) 90/68    Heart Rate (Admit) 55 bpm    Heart Rate (Exercise) 94 bpm    Heart Rate (Exit) 81 bpm    Rating of Perceived Exertion (Exercise) 12    Symptoms none    Duration Progress to 30 minutes of  aerobic without signs/symptoms of physical distress    Intensity THRR unchanged      Progression   Progression Continue to progress workloads to maintain intensity without signs/symptoms of physical distress.    Average METs 2      Resistance Training   Training Prescription Yes    Weight 0   Did not complete any  resistance training since last review   Reps 10-15      Interval Training   Interval Training No      Oxygen   Oxygen Continuous    Liters 3L      Treadmill   MPH 1.3    Grade 0    Minutes 15    METs 1.99      Biostep-RELP   Level 1    Minutes 15    METs 2      Oxygen   Maintain Oxygen Saturation 88% or higher          Nutrition:  Target Goals: Understanding of nutrition guidelines, daily intake of sodium 1500mg , cholesterol 200mg , calories 30% from fat and 7% or less from saturated fats, daily to have 5 or more servings of fruits and vegetables.  Education: All About Nutrition: -Group instruction provided by verbal, written material, interactive activities, discussions, models, and posters to present general guidelines for heart healthy nutrition including fat, fiber, MyPlate, the role of sodium in heart healthy nutrition, utilization of the nutrition label, and utilization of this knowledge for meal planning. Follow up email sent as well. Written material given at graduation. Flowsheet Row Cardiac Rehab from 03/09/2024 in [facility] Cardiac and Pulmonary Rehab  Education need identified 01/26/24    Biometrics:  Pre Biometrics - 01/26/24 1036       Pre Biometrics   Height 5' 9.1 (1.755 m)    Weight 308 lb 4.8 oz (139.8 kg)    Waist Circumference 58 inches    Hip Circumference 55 inches    Waist to Hip Ratio 1.05 %    BMI (Calculated) 45.4    Single Leg Stand 7.9 seconds           Nutrition Therapy Plan and Nutrition Goals:  Nutrition Therapy & Goals - 02/01/24 1340       Nutrition Therapy   Diet Carb controlled, Cardiac, Low Na    Protein (specify units) 90g    Fiber 30 grams    Whole Grain Foods 3 servings    Saturated Fats 15 max. grams    Fruits and Vegetables 5 servings/day    Sodium 2 grams      Personal Nutrition Goals   Nutrition Goal  Cut out sugary beverages, drink 32-48oz water daily    Personal Goal #2 Eat 15-30gProtein and 30-60gCarbs at  each meal.    Personal Goal #3 Read labels and reduce sodium intake to below 2300mg . Ideally 1500mg  per day.    Comments Patient drinking mostly sugary beverages like juice and soda. Says he has tried to move away from soda, reports he was drinking 4-6L of soda a day. Explained to him the negative impact sugary beverages have on his health, blood sugar and weight. Set goal to switch over and drink more water, with short term goal of ~32-48oz of water daily with 64-80oz as a long term goal. Educated on carb portions of ~30-60g per meal, the importance of eating throughout the day and choosing high quality carbs rather than junk food and sugary beverages. Provided him with Mediterranean diet handout. Educated on types of fats, sources, and how to read labels. He reports he adds salt to his foods, has been told to limit and look for better alternatives like misses dash. Confirmed and encouraged him to not salt food. Reinforced focus on keeping carb portions controlled at meals and limiting sodium.      Intervention Plan   Intervention Prescribe, educate and counsel regarding individualized specific dietary modifications aiming towards targeted core components such as weight, hypertension, lipid management, diabetes, heart failure and other comorbidities.;Nutrition handout(s) given to patient.    Expected Outcomes Short Term Goal: Understand basic principles of dietary content, such as calories, fat, sodium, cholesterol and nutrients.;Short Term Goal: A plan has been developed with personal nutrition goals set during dietitian appointment.;Long Term Goal: Adherence to prescribed nutrition plan.          Nutrition Assessments:  MEDIFICTS Score Key: >=70 Need to make dietary changes  40-70 Heart Healthy Diet <= 40 Therapeutic Level Cholesterol Diet  Flowsheet Row Cardiac Rehab from 02/01/2024 in [facility] Cardiac and Pulmonary Rehab  Picture Your Plate Total Score on Admission 60   Picture Your Plate  Scores: <59 Unhealthy dietary pattern with much room for improvement. 41-50 Dietary pattern unlikely to meet recommendations for good health and room for improvement. 51-60 More healthful dietary pattern, with some room for improvement.  >60 Healthy dietary pattern, although there may be some specific behaviors that could be improved.    Nutrition Goals Re-Evaluation:   Nutrition Goals Discharge (Final Nutrition Goals Re-Evaluation):   Psychosocial: Target Goals: Acknowledge presence or absence of significant depression and/or stress, maximize coping skills, provide positive support system. Participant is able to verbalize types and ability to use techniques and skills needed for reducing stress and depression.   Education: Stress, Anxiety, and Depression - Group verbal and visual presentation to define topics covered.  Reviews how body is impacted by stress, anxiety, and depression.  Also discusses healthy ways to reduce stress and to treat/manage anxiety and depression.  Written material given at graduation.   Education: Sleep Hygiene -Provides group verbal and written instruction about how sleep can affect your health.  Define sleep hygiene, discuss sleep cycles and impact of sleep habits. Review good sleep hygiene tips.    Initial Review & Psychosocial Screening:  Initial Psych Review & Screening - 01/20/24 1432       Initial Review   Current issues with None Identified      Family Dynamics   Good Support System? Yes   siblings,     Barriers   Psychosocial barriers to participate in program There are no identifiable barriers or psychosocial needs.  Screening Interventions   Interventions Encouraged to exercise;To provide support and resources with identified psychosocial needs;Provide feedback about the scores to participant    Expected Outcomes Short Term goal: Utilizing psychosocial counselor, staff and physician to assist with identification of specific Stressors or  current issues interfering with healing process. Setting desired goal for each stressor or current issue identified.;Long Term Goal: Stressors or current issues are controlled or eliminated.;Short Term goal: Identification and review with participant of any Quality of Life or Depression concerns found by scoring the questionnaire.;Long Term goal: The participant improves quality of Life and PHQ9 Scores as seen by post scores and/or verbalization of changes          Quality of Life Scores:   Quality of Life - 02/01/24 1704       Quality of Life   Select Quality of Life      Quality of Life Scores   Health/Function Pre 18.32 %    Socioeconomic Pre 27.5 %    Psych/Spiritual Pre 30 %    Family Pre 30 %    GLOBAL Pre 23.2 %         Scores of 19 and below usually indicate a poorer quality of life in these areas.  A difference of  2-3 points is a clinically meaningful difference.  A difference of 2-3 points in the total score of the Quality of Life Index has been associated with significant improvement in overall quality of life, self-image, physical symptoms, and general health in studies assessing change in quality of life.  PHQ-9: Review Flowsheet  More data exists      01/26/2024 10/17/2021 06/15/2018 05/18/2018 03/05/2018  Depression screen PHQ 2/9  Decreased Interest 1 0 0 0 0  Down, Depressed, Hopeless 0 0 0 0 1  PHQ - 2 Score 1 0 0 0 1  Altered sleeping 3 - - - -  Tired, decreased energy 1 - - - -  Change in appetite 1 - - - -  Feeling bad or failure about yourself  0 - - - -  Trouble concentrating 0 - - - -  Moving slowly or fidgety/restless 1 - - - -  Suicidal thoughts 0 - - - -  PHQ-9 Score 7 - - - -  Difficult doing work/chores Somewhat difficult - - - -   Interpretation of Total Score  Total Score Depression Severity:  1-4 = Minimal depression, 5-9 = Mild depression, 10-14 = Moderate depression, 15-19 = Moderately severe depression, 20-27 = Severe depression    Psychosocial Evaluation and Intervention:  Psychosocial Evaluation - 01/20/24 1447       Psychosocial Evaluation & Interventions   Comments Keilan has no barriers to attending the program. He lives alone. He is ready to start the program to work on exercise and learning more about amanging his heart failure.    Expected Outcomes STG attend all scheduled sessions, work on exercise progression as tolerated. Attend education sessions LTG COntinues with exercise progression, utilizes nutrition and education information to continue managing his health    Continue Psychosocial Services  Follow up required by staff          Psychosocial Re-Evaluation:   Psychosocial Discharge (Final Psychosocial Re-Evaluation):   Vocational Rehabilitation: Provide vocational rehab assistance to qualifying candidates.   Vocational Rehab Evaluation & Intervention:   Education: Education Goals: Education classes will be provided on a variety of topics geared toward better understanding of heart health and risk factor modification. Participant will state understanding/return  demonstration of topics presented as noted by education test scores.  Learning Barriers/Preferences:   General Cardiac Education Topics:  AED/CPR: - Group verbal and written instruction with the use of models to demonstrate the basic use of the AED with the basic ABC's of resuscitation.   Anatomy and Cardiac Procedures: - Group verbal and visual presentation and models provide information about basic cardiac anatomy and function. Reviews the testing methods done to diagnose heart disease and the outcomes of the test results. Describes the treatment choices: Medical Management, Angioplasty, or Coronary Bypass Surgery for treating various heart conditions including Myocardial Infarction, Angina, Valve Disease, and Cardiac Arrhythmias.  Written material given at graduation. Flowsheet Row Cardiac Rehab from 03/09/2024 in [facility] Cardiac and  Pulmonary Rehab  Education need identified 01/26/24    Medication Safety: - Group verbal and visual instruction to review commonly prescribed medications for heart and lung disease. Reviews the medication, class of the drug, and side effects. Includes the steps to properly store meds and maintain the prescription regimen.  Written material given at graduation.   Intimacy: - Group verbal instruction through game format to discuss how heart and lung disease can affect sexual intimacy. Written material given at graduation..   Know Your Numbers and Heart Failure: - Group verbal and visual instruction to discuss disease risk factors for cardiac and pulmonary disease and treatment options.  Reviews associated critical values for Overweight/Obesity, Hypertension, Cholesterol, and Diabetes.  Discusses basics of heart failure: signs/symptoms and treatments.  Introduces Heart Failure Zone chart for action plan for heart failure.  Written material given at graduation. Flowsheet Row Cardiac Rehab from 03/09/2024 in [facility] Cardiac and Pulmonary Rehab  Education need identified 01/26/24  Date 03/02/24  Educator sb  Instruction Review Code 1- Verbalizes Understanding    Infection Prevention: - Provides verbal and written material to individual with discussion of infection control including proper hand washing and proper equipment cleaning during exercise session. Flowsheet Row Cardiac Rehab from 03/09/2024 in [facility] Cardiac and Pulmonary Rehab  Date 01/26/24  Educator MB  Instruction Review Code 1- Verbalizes Understanding    Falls Prevention: - Provides verbal and written material to individual with discussion of falls prevention and safety. Flowsheet Row Cardiac Rehab from 03/09/2024 in [facility] Cardiac and Pulmonary Rehab  Date 01/26/24  Educator MB  Instruction Review Code 1- Verbalizes Understanding    Other: -Provides group and verbal instruction on various topics (see comments)   Knowledge  Questionnaire Score:  Knowledge Questionnaire Score - 01/26/24 1038       Knowledge Questionnaire Score   Pre Score 20/26          Core Components/Risk Factors/Patient Goals at Admission:  Personal Goals and Risk Factors at Admission - 01/26/24 1038       Core Components/Risk Factors/Patient Goals on Admission    Weight Management Yes;Obesity;Weight Loss    Intervention Weight Management: Develop a combined nutrition and exercise program designed to reach desired caloric intake, while maintaining appropriate intake of nutrient and fiber, sodium and fats, and appropriate energy expenditure required for the weight goal.;Weight Management: Provide education and appropriate resources to help participant work on and attain dietary goals.;Weight Management/Obesity: Establish reasonable short term and long term weight goals.;Obesity: Provide education and appropriate resources to help participant work on and attain dietary goals.    Admit Weight 308 lb 4.8 oz (139.8 kg)    Goal Weight: Short Term 274 lb (124.3 kg)    Goal Weight: Long Term 240 lb (108.9 kg)  Expected Outcomes Short Term: Continue to assess and modify interventions until short term weight is achieved;Long Term: Adherence to nutrition and physical activity/exercise program aimed toward attainment of established weight goal;Weight Loss: Understanding of general recommendations for a balanced deficit meal plan, which promotes 1-2 lb weight loss per week and includes a negative energy balance of [phone] kcal/d;Understanding recommendations for meals to include 15-35% energy as protein, 25-35% energy from fat, 35-60% energy from carbohydrates, less than 200mg  of dietary cholesterol, 20-35 gm of total fiber daily;Understanding of distribution of calorie intake throughout the day with the consumption of 4-5 meals/snacks    Diabetes Yes    Intervention Provide education about signs/symptoms and action to take for  hypo/hyperglycemia.;Provide education about proper nutrition, including hydration, and aerobic/resistive exercise prescription along with prescribed medications to achieve blood glucose in normal ranges: Fasting glucose 65-99 mg/dL    Expected Outcomes Short Term: Participant verbalizes understanding of the signs/symptoms and immediate care of hyper/hypoglycemia, proper foot care and importance of medication, aerobic/resistive exercise and nutrition plan for blood glucose control.;Long Term: Attainment of HbA1C < 7%.    Heart Failure Yes    Intervention Provide a combined exercise and nutrition program that is supplemented with education, support and counseling about heart failure. Directed toward relieving symptoms such as shortness of breath, decreased exercise tolerance, and extremity edema.    Expected Outcomes Improve functional capacity of life;Short term: Attendance in program 2-3 days a week with increased exercise capacity. Reported lower sodium intake. Reported increased fruit and vegetable intake. Reports medication compliance.;Short term: Daily weights obtained and reported for increase. Utilizing diuretic protocols set by physician.;Long term: Adoption of self-care skills and reduction of barriers for early signs and symptoms recognition and intervention leading to self-care maintenance.    Hypertension Yes    Intervention Provide education on lifestyle modifcations including regular physical activity/exercise, weight management, moderate sodium restriction and increased consumption of fresh fruit, vegetables, and low fat dairy, alcohol moderation, and smoking cessation.;Monitor prescription use compliance.    Expected Outcomes Short Term: Continued assessment and intervention until BP is < 140/53mm HG in hypertensive participants. < 130/22mm HG in hypertensive participants with diabetes, heart failure or chronic kidney disease.;Long Term: Maintenance of blood pressure at goal levels.    Lipids  Yes    Intervention Provide education and support for participant on nutrition & aerobic/resistive exercise along with prescribed medications to achieve LDL 70mg , HDL >40mg .    Expected Outcomes Short Term: Participant states understanding of desired cholesterol values and is compliant with medications prescribed. Participant is following exercise prescription and nutrition guidelines.;Long Term: Cholesterol controlled with medications as prescribed, with individualized exercise RX and with personalized nutrition plan. Value goals: LDL < 70mg , HDL > 40 mg.          Education:Diabetes - Individual verbal and written instruction to review signs/symptoms of diabetes, desired ranges of glucose level fasting, after meals and with exercise. Acknowledge that pre and post exercise glucose checks will be done for 3 sessions at entry of program.   Core Components/Risk Factors/Patient Goals Review:    Core Components/Risk Factors/Patient Goals at Discharge (Final Review):    ITP Comments:  ITP Comments     Row Name 01/20/24 1443 01/26/24 1027 02/01/24 1012 02/03/24 1041 03/02/24 1036   ITP Comments Virtual orientation call completed today. he has an appointment on Date: 01/26/2024  for EP eval and gym Orientation.  Documentation of diagnosis can be found in [facility] 12/16/2023 . Completed and gym orientation for  cardiac rehab. Initial ITP created and sent for review to Dr. Oneil Pinal, Medical Director. First full day of exercise!  Patient was oriented to gym and equipment including functions, settings, policies, and procedures.  Patient's individual exercise prescription and treatment plan were reviewed.  All starting workloads were established based on the results of the 6 minute walk test done at initial orientation visit.  The plan for exercise progression was also introduced and progression will be customized based on patient's performance and goals. 30 Day review completed. Medical Director ITP  review done, changes made as directed, and signed approval by Medical Director.    new to program 30 Day review completed. Medical Director ITP review done, changes made as directed, and signed approval by Medical Director.    Row Name 03/21/24 0944 03/30/24 0958         ITP Comments Pt able to follow exercise prescription today. Pt stated he wasn't feeling well due to extra fluid he has and is leaving  a little early to go home and take his fluid pill.  Will continue to monitor for progression. 30 Day review completed. Medical Director ITP review done, changes made as directed, and signed approval by Medical Director.         Comments: 30 day review

## 2024-04-01 ENCOUNTER — Telehealth: Payer: Self-pay | Admitting: Family

## 2024-04-01 NOTE — Telephone Encounter (Signed)
 Called to confirm/remind patient of their appointment at the [facility] on 04/04/24.   Appointment:   [x] Confirmed  [] Left mess   [] No answer/No voice mail  [] VM Full/unable to leave message  [] Phone not in service  Patient reminded to bring all medications and/or complete list.  Confirmed patient has transportation. Gave directions, instructed to utilize valet parking.

## 2024-04-04 ENCOUNTER — Encounter: Payer: Self-pay | Admitting: *Deleted

## 2024-04-04 ENCOUNTER — Ambulatory Visit: Admitting: Adult Health

## 2024-04-04 ENCOUNTER — Encounter

## 2024-04-04 ENCOUNTER — Other Ambulatory Visit
Admission: RE | Admit: 2024-04-04 | Discharge: 2024-04-04 | Disposition: A | Discharge status: Home / Self Care | Source: Ambulatory Visit | Attending: Family | Admitting: Family

## 2024-04-04 ENCOUNTER — Ambulatory Visit (HOSPITAL_COMMUNITY): Payer: Self-pay | Admitting: Adult Health

## 2024-04-04 VITALS — BP 110/64 | HR 70 | Wt 308.5 lb

## 2024-04-04 DIAGNOSIS — I5022 Chronic systolic (congestive) heart failure: Secondary | ICD-10-CM | POA: Insufficient documentation

## 2024-04-04 DIAGNOSIS — G4733 Obstructive sleep apnea (adult) (pediatric): Secondary | ICD-10-CM | POA: Diagnosis not present

## 2024-04-04 DIAGNOSIS — R0602 Shortness of breath: Secondary | ICD-10-CM

## 2024-04-04 DIAGNOSIS — I480 Paroxysmal atrial fibrillation: Secondary | ICD-10-CM | POA: Diagnosis not present

## 2024-04-04 LAB — BASIC METABOLIC PANEL WITH GFR
Anion gap: 11 (ref 5–15)
BUN: 55 mg/dL — ABNORMAL HIGH (ref 6–20)
CO2: 27 mmol/L (ref 22–32)
Calcium: 9.1 mg/dL (ref 8.9–10.3)
Chloride: 101 mmol/L (ref 98–111)
Creatinine, Ser: 1.9 mg/dL — ABNORMAL HIGH (ref 0.61–1.24)
GFR, Estimated: 43 mL/min — ABNORMAL LOW (ref >60)
Glucose, Bld: 311 mg/dL — ABNORMAL HIGH (ref 70–99)
Potassium: 4 mmol/L (ref 3.5–5.1)
Sodium: 139 mmol/L (ref 135–145)

## 2024-04-04 MED ORDER — SACUBITRIL-VALSARTAN 24-26 MG PO TABS: 1.0000 | ORAL_TABLET | Freq: Two times a day (BID) | ORAL | 3 refills | Status: DC

## 2024-04-04 NOTE — Patient Instructions (Addendum)
 2 Medication Changes:  BEGIN taking Entresto  24-26 MG twice daily  Lab Work:  Labs done today, your results will be available in MyChart, we will contact you for abnormal readings.   Special Instructions // Education:  Do the following things EVERYDAY: Weigh yourself in the morning before breakfast. Write it down and keep it in a log. Take your medicines as prescribed Eat low salt foods--Limit salt (sodium) to 2000 mg per day.  Stay as active as you can everyday Limit all fluids for the day to less than 2 liters   Follow-Up in: 2 weeks with Ellouise Class, NP.    If you have any questions or concerns before your next appointment please send us  a message through [place] or call our office at [phone], If it is after office hours your call will be answered by our answering service and directed appropriately.     At the [facility], you and your health needs are our priority. We have a designated team specialized in the treatment of Heart Failure. This Care Team includes your primary Heart Failure Specialized Cardiologist (physician), Advanced Practice Providers (APPs- Physician Assistants and Nurse Practitioners), and Pharmacist who all work together to provide you with the care you need, when you need it.   You may see any of the following providers on your designated Care Team at your next follow up:  Dr. Toribio Fuel Dr. Ezra Shuck Dr. Ria Commander Dr. Odis Brownie Greig Mosses, NP Caffie Shed, [place] [address] [place], [place] Beckey Coe, NP [place] Lee, NP Ellouise Class, NP Jaun Bash, PharmD

## 2024-04-04 NOTE — Progress Notes (Signed)
 Advanced Heart Failure Clinic Note    PCP: Antone Bottoms, MD (last seen 01/25) Primary Cardiologist: Florencio Kava, MD / Elodie Ronal Maxwell, NP (last seen 06/25; returns 10/25) HF provider: Bulah Cornet, MD (last seen 05/24)  Chief Complaint: Heart Failure   HPI: Oscar [name] is a 48 y/o male with a history of obstructive sleep apnea (currently with CPAP), MI, HTN, VT (successful shock X 3), CKD, COPD, PAF, gout, hyperlipidemia, GERD, IDDM, SVT,asthma, chronic LBBB and chronic heart failure with CRT-D (2016).  Stress test 03/01/2019: Abnormal myocardial perfusion scan no clear evidence of reversible ischemia inferior persistent defect either scar versus body habitus artifact severely dilated left ventricular function severely depressed LV function with ejection fraction of around 11%. Recommend medical therapy   Echo 03/01/2019: EF of 15% along with mild Oscar.  Echo 02/27/20: EF of <20% Echo 11/23/20: EF of <20% along with moderate LVH and moderately elevated PA pressure. Echo 10/20/22: EF <20% along with mildly elevated PA pressure and mild Oscar.   Admitted 06/12/23 due to ICD firing after sudden. Admitted and loaded on amiodarone . Needed amiodarone  drip with transition to or amiodarone .   CPET done 10/09/23 revealed sub-maximal exercise test, so maximal cardiorespiratory capacity could not be determined. Moderate to severe functional impairment appreciated. There were signs of a mixed cardiopulmonary limitation to exercise.   Admitted 12/01/23 with HF/ COPD exacerbation. IV diuresed. Cardiology consulted. IV solu-medrol  given with nebulizer, antibiotics. Given prednisone  taper at discharge. Troponin elevated due to demand ischemia. Echo 12/01/23: EF 30-35% with normal RV, mild Oscar  Admitted 12/04/23 with worsening shortness of breath. Found to be + covid. Given IV lasix  for a/c heart failure. Given IV steroids with transition to oral prednisone  for COPD exacerbation. Had episode of chest pain,  improved after giving bronchodilator. Troponin trending down not consistent with ACS.   Seen in [facility] 06/25 and sent for IV lasix . Amlodipine  and losartan  were both stopped due to hypotension.   Overall feeling better. Gets SOB steps and orthopnea. Appetite ok. No fever or chills. Weight at home 308 pounds. Taking all medications but says he only took 20 mg of torsemide  yesterday instead on 40 mg daily. He declines anticoagulants.   ROS: All systems negative except as listed in HPI, PMH and Problem List.  SH:  Social History   Socioeconomic History   Marital status: Single    Spouse name: Not on file   Number of children: 0   Years of education: Not on file   Highest education level: Some college, no degree  Occupational History   Occupation: unemployed  Tobacco Use   Smoking status: Former    Current packs/day: 0.00    Average packs/day: 1.5 packs/day for 34.0 years (51.0 ttl pk-yrs)    Types: Cigarettes    Quit date: 05/2023    Years since quitting: 0.9    Passive exposure: Past   Smokeless tobacco: Never   Tobacco comments:    Quit smoking 05/2023  Vaping Use   Vaping status: Never Used  Substance and Sexual Activity   Alcohol use: No   Drug use: No   Sexual activity: Yes  Other Topics Concern   Not on file  Social History Narrative   Live alone   Social Drivers of Health   Financial Resource Strain: Medium Risk (12/02/2023)   Overall Financial Resource Strain (CARDIA)    Difficulty of Paying Living Expenses: Somewhat hard  Food Insecurity: No Food Insecurity (12/04/2023)   Hunger Vital Sign  Worried About Programme researcher, broadcasting/film/video in the Last Year: Never true    Ran Out of Food in the Last Year: Never true  Recent Concern: Food Insecurity - Food Insecurity Present (11/23/2023)   Received from [facility]   Hunger Vital Sign    Within the past 12 months, you worried that your food would run out before you got the money to buy more.: Sometimes true     Within the past 12 months, the food you bought just didn't last and you didn't have money to get more.: Sometimes true  Transportation Needs: No Transportation Needs (12/04/2023)   PRAPARE - Administrator, Civil Service (Medical): No    Lack of Transportation (Non-Medical): No  Physical Activity: Not on file  Stress: Not on file  Social Connections: Moderately Isolated (12/04/2023)   Social Connection and Isolation Panel    Frequency of Communication with Friends and Family: More than three times a week    Frequency of Social Gatherings with Friends and Family: Once a week    Attends Religious Services: More than 4 times per year    Active Member of [employer] or Organizations: No    Attends Banker Meetings: Never    Marital Status: Never married  Intimate Partner Violence: Not At Risk (12/04/2023)   Humiliation, Afraid, Rape, and Kick questionnaire    Fear of Current or Ex-Partner: No    Emotionally Abused: No    Physically Abused: No    Sexually Abused: No    FH:  Family History  Problem Relation Age of Onset   Hypertension Mother    Congestive Heart Failure Mother    Hypertension Sister    Diabetes Sister    Pancreatitis Sister    COPD Sister    Emphysema Sister        smoked   Pancreatitis Brother    Anemia Neg Hx    Arrhythmia Neg Hx    Asthma Neg Hx    Clotting disorder Neg Hx    Fainting Neg Hx    Heart attack Neg Hx    Heart disease Neg Hx    Heart failure Neg Hx    Hyperlipidemia Neg Hx     Past Medical History:  Diagnosis Date   AICD (automatic cardioverter/defibrillator) present    Aortic atherosclerosis (HCC)    Asthma    Chronic respiratory failure (HCC)    CKD (chronic kidney disease), stage III (HCC)    COPD (chronic obstructive pulmonary disease) (HCC)    Deafness in right ear    Diabetes mellitus without complication (HCC)    Dyspnea    GERD (gastroesophageal reflux disease)    Gout    HFrEF (heart failure with reduced  ejection fraction) (HCC)    a.) TTE 12/10/14: EF 25%; diff inf HK; sev LV and mod LA dil; LVH. b.) TTE 06/23/18: EF 20-25%; LVH; mild LV dil, mild BAE. c.) TTE 03/01/19: EF 15%, LVH, BAE; triv PR/TR. d.) TTE 02/27/20: EF < 20%; sev LV dil; mild Oscar. e.) TTE 11/23/20: EF < 20%; glob HK; sev LV dil; LVH; mild-mod BAE; mod-sev TR, triv AR; G1DD. f.) TTE 05/14/21: EF <15%; LVH; sev LA and mild RV enlar; triv AR/PR, mild TR, mod Oscar.   Hiatal hernia    History of cardiac catheterization    a.) R/LHC 12/14/2009: normal coronaries. b.) R/LHC 12/11/2014: normal coronaries.   Hyperlipidemia    Hypertension    Hypoxemia  LBBB (left bundle branch block)    NICM (nonischemic cardiomyopathy; dilated cardiomyopathy) (HCC)    a.) R/LHC 12/14/2009: normal cors; LVEDP 18 mmHg, mean PA 29 mmHg, mean PCWP 31 mmHg; CO 6 L/min; CI 2.54 L/min/m. b.) TTE 12/10/2014: EF 25%. c.) R/LHC 12/11/2014: mean RA 9 mmHg, mean PA 22 mmHg, mean PCWP 22 mmHg. d.) TTE 06/23/2018: EF 20-25%. e.) TTE 03/01/2019: EF 15%. f.) TTE 02/27/2020: < 20%. g.) TTE 11/23/2020: EF <20%. h.) TTE 05/14/2021: < 15%.   NSTEMI (non-ST elevated myocardial infarction) (HCC)    a.) x 3 per patient report ---> 2012, 2014, 2016   Obesity    On supplemental oxygen by nasal cannula    a.) 2-3 L/[place] PRN   OSA on CPAP    PAF (paroxysmal atrial fibrillation) (HCC)    a.) CHA2DS2-VASc = 4 (HFrEF, HTN, prior MI, T2DM). b.) rate/rhythm maintained without pharmacologial intervention; no current anticoagulation.   Pancreatitis    PSVT (paroxysmal supraventricular tachycardia) (HCC)     Current Outpatient Medications  Medication Sig Dispense Refill   albuterol  (VENTOLIN  HFA) 108 (90 Base) MCG/ACT inhaler Inhale 2 puffs into the lungs every 6 (six) hours as needed for wheezing or shortness of breath. 8 g 2   allopurinol  (ZYLOPRIM ) 100 MG tablet Take 100 mg by mouth daily.     amiodarone  (PACERONE ) 200 MG tablet Take 1 tablet (200 mg total) by mouth daily.      atorvastatin  (LIPITOR ) 80 MG tablet Take 1 tablet (80 mg total) by mouth at bedtime. 90 tablet 3   colchicine  0.6 MG tablet Take 0.6 mg by mouth daily.     dapagliflozin  propanediol (FARXIGA ) 10 MG TABS tablet Take 1 tablet (10 mg total) by mouth daily. 30 tablet 2   doxycycline  (ADOXA) 100 MG tablet Take 100 mg by mouth 2 (two) times daily.     Fluticasone -Umeclidin-Vilant (TRELEGY ELLIPTA ) 100-62.5-25 MCG/ACT AEPB Inhale 1 puff into the lungs daily. 60 each 11   metoprolol  succinate (TOPROL -XL) 25 MG 24 hr tablet Take 1 tablet (25 mg total) by mouth daily. Take with or immediately following a meal. 30 tablet 11   midodrine  (PROAMATINE ) 5 MG tablet Take 1 tablet (5 mg total) by mouth 3 (three) times daily as needed. If bp top number is lower than 100 90 tablet 5   pantoprazole  (PROTONIX ) 40 MG tablet Take 1 tablet (40 mg total) by mouth daily.     torsemide  (DEMADEX ) 20 MG tablet Take 40 mg by mouth daily.     NON FORMULARY Pt uses a cpap nightly     No current facility-administered medications for this visit.   Vitals:   04/04/24 0849  BP: 110/64  Pulse: 70  SpO2: 97%  Weight: (!) 308 lb 8 oz (139.9 kg)    Wt Readings from Last 3 Encounters:  04/04/24 (!) 308 lb 8 oz (139.9 kg)  02/24/24 (!) 309 lb (140.2 kg)  02/17/24 (!) 303 lb 9.6 oz (137.7 kg)   Lab Results  Component Value Date   CREATININE 1.83 (H) 02/24/2024   CREATININE 1.77 (H) 02/04/2024   CREATININE 1.78 (H) 02/03/2024    PHYSICAL EXAM: General:   No resp difficulty Neck: JVP difficult to assess due to body habitus.  Cor: Regular rate & rhythm. Lungs: clear Abdomen: soft, nontender, nondistended.  Extremities: no  edema Neuro: alert & oriented x3   ASSESSMENT & PLAN:  1. Chronic heart failure with reduced ejection fraction - - Echo 03/01/2019: EF of 15% along  with mild Oscar.  - Echo 02/27/20: EF of <20% - Echo 11/23/20: EF of <20% along with moderate LVH and moderately elevated PA pressure. - Echo 10/20/22: EF  <20% along with mildly elevated PA pressure and mild Oscar.  - Echo 12/01/23: EF 30-35% with normal RV, mild Oscar - NYHA III confounded by COPD. GDMT limited by CKD and hypotension.  -  continue farxiga  10mg  daily  - continue metoprolol  succinate 25mg  daily  - continue torsemide  40mg  daily. Discussed medication compliance. Discussed low salt food choices.  -  He was adamant he was wants to try entresto  again. I checked BMET. Creatinine 1.9 and he is on midodrine . I called him and instructed to not take entresto . He verbalized understanding.   2. Hypotension  -  continue midodrine  5mg  TID if SBP <100  3: Obstructive sleep apnea- - wears nocturnal ventilator at bedtime - followed by pulmonary.   4: Diabetes - A1c 12/03/23 was 8.5% -Continue farxiga  10 mg daily   5: Atrial fibrillation- - saw cardiology Debby) 06/25 - currently taking amiodarone  200mg  daily  - Adamant he does not want to take eliquis . He understands the risk of stroke.  - last eye exam ~ 03/25  6: VT - Followed by [facility] for possible device upgrade.   7: Hyperlipidemia- - continue atorvastatin  40mg  daily    Follow up in 2 weeks  with Ellouise Class NP to reassess volume status.    Oscar Mosses, NP 04/04/24

## 2024-04-06 ENCOUNTER — Encounter: Attending: Internal Medicine

## 2024-04-06 ENCOUNTER — Encounter

## 2024-04-06 DIAGNOSIS — I5022 Chronic systolic (congestive) heart failure: Secondary | ICD-10-CM | POA: Diagnosis present

## 2024-04-06 NOTE — Progress Notes (Signed)
 Daily Session Note  Patient Details  Name: Oscar [name] MRN: [id number] Date of Birth: 07-01-1976 Referring Provider:   Flowsheet Row Cardiac Rehab from 01/26/2024 in [facility]  Referring Provider Florencio Kava, MD    Encounter Date: 04/06/2024  Check In:  Session Check In - 04/06/24 9081       Check-In   Supervising physician immediately available to respond to emergencies See telemetry face sheet for immediately available ER MD    Location [facility]-Cardiac & Pulmonary Rehab    Staff Present Burnard Davenport RN,BSN,MPA;[facility] RCP,RRT,BSRT;Margaret Best, MS, Exercise Physiologist;Jason Elnor [facility]    Virtual Visit No    Medication changes reported     No    Fall or balance concerns reported    No    Warm-up and Cool-down Performed on first and last piece of equipment    Resistance Training Performed Yes    VAD Patient? No    PAD/SET Patient? No      Pain Assessment   Currently in Pain? No/denies             Social History   Tobacco Use  Smoking Status Former   Current packs/day: 0.00   Average packs/day: 1.5 packs/day for 34.0 years (51.0 ttl pk-yrs)   Types: Cigarettes   Quit date: 05/2023   Years since quitting: 0.9   Passive exposure: Past  Smokeless Tobacco Never  Tobacco Comments   Quit smoking 05/2023    Goals Met:  Independence with exercise equipment Exercise tolerated well No report of concerns or symptoms today Strength training completed today  Goals Unmet:  Not Applicable  Comments: Pt able to follow exercise prescription today without complaint.  Will continue to monitor for progression.    Dr. Oneil Pinal is Medical Director for [facility] Cardiac Rehabilitation.  Dr. Fuad Aleskerov is Medical Director for [facility] [place] Pulmonary Rehabilitation.

## 2024-04-07 ENCOUNTER — Ambulatory Visit (INDEPENDENT_AMBULATORY_CARE_PROVIDER_SITE_OTHER): Admitting: Podiatry

## 2024-04-07 DIAGNOSIS — B351 Tinea unguium: Secondary | ICD-10-CM

## 2024-04-07 DIAGNOSIS — Z794 Long term (current) use of insulin: Secondary | ICD-10-CM

## 2024-04-07 DIAGNOSIS — M79674 Pain in right toe(s): Secondary | ICD-10-CM

## 2024-04-07 DIAGNOSIS — M79675 Pain in left toe(s): Secondary | ICD-10-CM

## 2024-04-07 DIAGNOSIS — E119 Type 2 diabetes mellitus without complications: Secondary | ICD-10-CM | POA: Diagnosis not present

## 2024-04-07 NOTE — Progress Notes (Signed)
  Subjective:  Patient ID: Oscar [name], male    DOB: 12-08-1975,  MRN: [id number]  Chief Complaint  Patient presents with   Nail Problem    Nail trim    48 y.o. male presents with the above complaint. History confirmed with patient.  His nails are thickened and elongated and causing discomfort again, says his diabetes is well controlled  Denies tingling numbness or neuropathy symptoms  Objective:  Physical Exam: warm, good capillary refill, no trophic changes or ulcerative lesions, normal DP and PT pulses, and normal sensory exam. Left Foot: dystrophic yellowed discolored nail plates with subungual debris Right Foot: dystrophic yellowed discolored nail plates with subungual debris  Assessment:   No diagnosis found.      Plan:  Patient was evaluated and treated and all questions answered.  Discussed the etiology and treatment options for the condition in detail with the patient. Educated patient on the topical and oral treatment options for mycotic nails. Recommended debridement of the nails today. Sharp and mechanical debridement performed of all painful and mycotic nails today. Nails debrided in length and thickness using a nail nipper to level of comfort. Discussed treatment options including appropriate shoe gear. Follow up as needed for painful nails.    No follow-ups on file.

## 2024-04-11 ENCOUNTER — Encounter

## 2024-04-13 ENCOUNTER — Encounter

## 2024-04-13 ENCOUNTER — Telehealth: Payer: Self-pay | Admitting: *Deleted

## 2024-04-13 NOTE — Telephone Encounter (Signed)
 Oscar [name] had missed several days of appointments in cardiac rehab. He was called to follow up on this and he stated he was planning to come back on Monday 04/18/2024. He was advised to call in the future if he was unable to make his appointments.

## 2024-04-15 ENCOUNTER — Telehealth: Payer: Self-pay | Admitting: Family

## 2024-04-15 NOTE — Telephone Encounter (Signed)
 Called to confirm/remind patient of their appointment at the [facility] on 04/18/24.   Appointment:   [x] Confirmed  [] Left mess   [] No answer/No voice mail  [] VM Full/unable to leave message  [] Phone not in service  Patient reminded to bring all medications and/or complete list.  Confirmed patient has transportation. Gave directions, instructed to utilize valet parking.

## 2024-04-17 NOTE — Progress Notes (Unsigned)
 Advanced [facility] Failure Clinic Note    PCP: Antone Bottoms, MD  Primary Cardiologist: Florencio Kava, MD / Elodie Ronal Maxwell, NP (last seen 06/25; returns 10/25) HF provider: Bulah Cornet, MD (last seen 05/24)  Chief Complaint:    HPI:  Oscar [name] is a 48 y/o male with a history of obstructive sleep apnea (currently with CPAP), MI, HTN, VT (successful shock X 3), CKD, COPD, PAF, gout, hyperlipidemia, GERD, IDDM, SVT,asthma, chronic LBBB and chronic heart failure with CRT-D (2016).  Stress test 03/01/2019: Abnormal myocardial perfusion scan no clear evidence of reversible ischemia inferior persistent defect either scar versus body habitus artifact severely dilated left ventricular function severely depressed LV function with ejection fraction of around 11%. Recommend medical therapy   Echo 03/01/2019: EF of 15% along with mild Oscar.  Echo 02/27/20: EF of <20% Echo 11/23/20: EF of <20% along with moderate LVH and moderately elevated PA pressure. Echo 10/20/22: EF <20% along with mildly elevated PA pressure and mild Oscar.   Was in the ED 04/27/23 due to groin abscess X 1 month with drainage. CT done with f/u to be done with surgeon.   Admitted 06/12/23 due to ICD firing after sudden onset of SOB, chest tightness and dizziness. He was able to stand up and make it to the bathroom when he felt his ICD firing again. Afterwards, his shortness of breath, chest pain and dizziness resolved. He called EMS and on route to the ED, he was told that his ICD fired again. Successful conversion after the 3rd shock. Cardiology consulted. Needed amiodarone  drip with transition to or amiodarone .   CPET done 10/09/23 revealed sub-maximal exercise test, so maximal cardiorespiratory capacity could not be determined. Moderate to severe functional impairment appreciated. There were signs of a mixed cardiopulmonary limitation to exercise.   Was in the ED 11/07/23 with right elbow pain. Has a history of gout. Was given  colchicine  but has to wait 3 days before starting because he had been taking it daily for several weeks.  Admitted 12/01/23 with HF/ COPD exacerbation. IV diuresed. Cardiology consulted. IV solu-medrol  given with nebulizer, antibiotics. Given prednisone  taper at discharge. Troponin elevated due to demand ischemia. Echo 12/01/23: EF 30-35% with normal RV, mild Oscar  Was in the ED 12/03/23 with an episode of chest pain when getting in the shower. Also had lightheadedness and shortness of breath. Trponin 80 & then 130. EKG without acute ischemia. SOB improved and he was released.   Admitted 12/04/23 with worsening shortness of breath. Found to be + covid. Given IV lasix  for a/c heart failure. Given IV steroids with transition to oral prednisone  for COPD exacerbation. Had episode of chest pain, improved after giving bronchodilator. Troponin trending down not consistent with ACS.   Seen in [facility] 04/25 and had torsemide  decreased to 20mg  daily after getting BMET back.   Seen in [facility] 06/25 and sent for IV lasix . Amlodipine  and losartan  were both stopped due to hypotension.   He presents today for a HF follow-up visit with a chief complaint of   He says that he has continued to wear his NIV but that he's having difficulty getting consistent respiratory supplies. Last sleep study 07/2016.   ROS: All systems negative except as listed in HPI, PMH and Problem List.  SH:  Social History   Socioeconomic History   Marital status: Single    Spouse name: Not on file   Number of children: 0   Years of education: Not on file   Highest  education level: Some college, no degree  Occupational History   Occupation: unemployed  Tobacco Use   Smoking status: Former    Current packs/day: 0.00    Average packs/day: 1.5 packs/day for 34.0 years (51.0 ttl pk-yrs)    Types: Cigarettes    Quit date: 05/2023    Years since quitting: 0.9    Passive exposure: Past   Smokeless tobacco: Never   Tobacco comments:    Quit  smoking 05/2023  Vaping Use   Vaping status: Never Used  Substance and Sexual Activity   Alcohol use: No   Drug use: No   Sexual activity: Yes  Other Topics Concern   Not on file  Social History Narrative   Live alone   Social Drivers of Health   Financial Resource Strain: Medium Risk (12/02/2023)   Overall Financial Resource Strain (CARDIA)    Difficulty of Paying Living Expenses: Somewhat hard  Food Insecurity: No Food Insecurity (12/04/2023)   Hunger Vital Sign    Worried About Running Out of Food in the Last Year: Never true    Ran Out of Food in the Last Year: Never true  Recent Concern: Food Insecurity - Food Insecurity Present (11/23/2023)   Received from [facility]   Hunger Vital Sign    Within the past 12 months, you worried that your food would run out before you got the money to buy more.: Sometimes true    Within the past 12 months, the food you bought just didn't last and you didn't have money to get more.: Sometimes true  Transportation Needs: No Transportation Needs (12/04/2023)   PRAPARE - Administrator, Civil Service (Medical): No    Lack of Transportation (Non-Medical): No  Physical Activity: Not on file  Stress: Not on file  Social Connections: Moderately Isolated (12/04/2023)   Social Connection and Isolation Panel    Frequency of Communication with Friends and Family: More than three times a week    Frequency of Social Gatherings with Friends and Family: Once a week    Attends Religious Services: More than 4 times per year    Active Member of [employer] or Organizations: No    Attends Banker Meetings: Never    Marital Status: Never married  Intimate Partner Violence: Not At Risk (12/04/2023)   Humiliation, Afraid, Rape, and Kick questionnaire    Fear of Current or Ex-Partner: No    Emotionally Abused: No    Physically Abused: No    Sexually Abused: No    FH:  Family History  Problem Relation Age of Onset    Hypertension Mother    Congestive Heart Failure Mother    Hypertension Sister    Diabetes Sister    Pancreatitis Sister    COPD Sister    Emphysema Sister        smoked   Pancreatitis Brother    Anemia Neg Hx    Arrhythmia Neg Hx    Asthma Neg Hx    Clotting disorder Neg Hx    Fainting Neg Hx    Heart attack Neg Hx    Heart disease Neg Hx    Heart failure Neg Hx    Hyperlipidemia Neg Hx     Past Medical History:  Diagnosis Date   AICD (automatic cardioverter/defibrillator) present    Aortic atherosclerosis (HCC)    Asthma    Chronic respiratory failure (HCC)    CKD (chronic kidney disease), stage III (HCC)  COPD (chronic obstructive pulmonary disease) (HCC)    Deafness in right ear    Diabetes mellitus without complication (HCC)    Dyspnea    GERD (gastroesophageal reflux disease)    Gout    HFrEF (heart failure with reduced ejection fraction) (HCC)    a.) TTE 12/10/14: EF 25%; diff inf HK; sev LV and mod LA dil; LVH. b.) TTE 06/23/18: EF 20-25%; LVH; mild LV dil, mild BAE. c.) TTE 03/01/19: EF 15%, LVH, BAE; triv PR/TR. d.) TTE 02/27/20: EF < 20%; sev LV dil; mild Oscar. e.) TTE 11/23/20: EF < 20%; glob HK; sev LV dil; LVH; mild-mod BAE; mod-sev TR, triv AR; G1DD. f.) TTE 05/14/21: EF <15%; LVH; sev LA and mild RV enlar; triv AR/PR, mild TR, mod Oscar.   Hiatal hernia    History of cardiac catheterization    a.) R/LHC 12/14/2009: normal coronaries. b.) R/LHC 12/11/2014: normal coronaries.   Hyperlipidemia    Hypertension    Hypoxemia    LBBB (left bundle branch block)    NICM (nonischemic cardiomyopathy; dilated cardiomyopathy) (HCC)    a.) R/LHC 12/14/2009: normal cors; LVEDP 18 mmHg, mean PA 29 mmHg, mean PCWP 31 mmHg; CO 6 L/min; CI 2.54 L/min/m. b.) TTE 12/10/2014: EF 25%. c.) R/LHC 12/11/2014: mean RA 9 mmHg, mean PA 22 mmHg, mean PCWP 22 mmHg. d.) TTE 06/23/2018: EF 20-25%. e.) TTE 03/01/2019: EF 15%. f.) TTE 02/27/2020: < 20%. g.) TTE 11/23/2020: EF <20%. h.) TTE  05/14/2021: < 15%.   NSTEMI (non-ST elevated myocardial infarction) (HCC)    a.) x 3 per patient report ---> 2012, 2014, 2016   Obesity    On supplemental oxygen by nasal cannula    a.) 2-3 L/Ceiba PRN   OSA on CPAP    PAF (paroxysmal atrial fibrillation) (HCC)    a.) CHA2DS2-VASc = 4 (HFrEF, HTN, prior MI, T2DM). b.) rate/rhythm maintained without pharmacologial intervention; no current anticoagulation.   Pancreatitis    PSVT (paroxysmal supraventricular tachycardia) (HCC)     Current Outpatient Medications  Medication Sig Dispense Refill   albuterol  (VENTOLIN  HFA) 108 (90 Base) MCG/ACT inhaler Inhale 2 puffs into the lungs every 6 (six) hours as needed for wheezing or shortness of breath. 8 g 2   allopurinol  (ZYLOPRIM ) 100 MG tablet Take 100 mg by mouth daily.     amiodarone  (PACERONE ) 200 MG tablet Take 1 tablet (200 mg total) by mouth daily.     atorvastatin  (LIPITOR ) 80 MG tablet Take 1 tablet (80 mg total) by mouth at bedtime. 90 tablet 3   colchicine  0.6 MG tablet Take 0.6 mg by mouth daily.     dapagliflozin  propanediol (FARXIGA ) 10 MG TABS tablet Take 1 tablet (10 mg total) by mouth daily. 30 tablet 2   doxycycline  (ADOXA) 100 MG tablet Take 100 mg by mouth 2 (two) times daily.     Fluticasone -Umeclidin-Vilant (TRELEGY ELLIPTA ) 100-62.5-25 MCG/ACT AEPB Inhale 1 puff into the lungs daily. 60 each 11   metoprolol  succinate (TOPROL -XL) 25 MG 24 hr tablet Take 1 tablet (25 mg total) by mouth daily. Take with or immediately following a meal. 30 tablet 11   midodrine  (PROAMATINE ) 5 MG tablet Take 1 tablet (5 mg total) by mouth 3 (three) times daily as needed. If bp top number is lower than 100 90 tablet 5   NON FORMULARY Pt uses a cpap nightly     pantoprazole  (PROTONIX ) 40 MG tablet Take 1 tablet (40 mg total) by mouth daily.  sacubitril -valsartan  (ENTRESTO ) 24-26 MG Take 1 tablet by mouth 2 (two) times daily. 60 tablet 3   torsemide  (DEMADEX ) 20 MG tablet Take 40 mg by mouth daily.      No current facility-administered medications for this visit.      PHYSICAL EXAM:  General: Well appearing. No resp difficulty HEENT: normal Neck: supple, no JVD Cor: Regular rhythm, rate. No rubs, gallops or murmurs Lungs: clear Abdomen: soft, nontender, nondistended. Extremities: no cyanosis, clubbing, rash, edema Neuro: alert & oriented X 3. Moves all 4 extremities w/o difficulty. Affect pleasant   ECG: not done   ASSESSMENT & PLAN:  1: Chronic heart failure with reduced ejection fraction- - NYHA class III - euvolemic - weight 308.8 pounds from last visit here 2 weeks ago - encouraged continued fluid restriction of no more than 2L - Stress test 03/01/2019:   Abnormal myocardial perfusion scan no clear evidence of reversible ischemia inferior persistent defect either scar versus body habitus artifact severely dilated left ventricular function severely depressed LV function with ejection fraction of around 11%.   - Echo 03/01/2019: EF of 15% along with mild Oscar.  - Echo 02/27/20: EF of <20% - Echo 11/23/20: EF of <20% along with moderate LVH and moderately elevated PA pressure. - Echo 10/20/22: EF <20% along with mildly elevated PA pressure and mild Oscar.  - Echo 12/01/23: EF 30-35% with normal RV, mild Oscar - AICD (2016) in place but denies any shocks in a long time - GDMT limited by hypotension and CKD - continue farxiga  10mg  daily  - continue metoprolol  succinate 25mg  daily  - continue torsemide  40mg  daily - losartan  stopped 02/03/24 but patient has continued to take this; losartan  bottle was removed from patient's medication bag - in the past, spironolactone  has caused hypotension - saw HF provider Oscar [name]) 05/24 - BNP 02/04/24 was 357.2  2: HTN with CKD- - BP  - continue midodrine  5mg  TID if SBP <100 - sees PCP (Oscar [name]) at [facility]  - BMP 04/04/24 reviewed: sodium 139, potassium 4.0, Cr 1.9 and GFR 43 - saw nephrology Oscar [name]) 08/25  3: Obstructive sleep  apnea- - wears nocturnal ventilator QHS, ~ 6-8 hours/night; reports sleeping well - continues wearing oxygen at 3L at bedtime & PRN during the day - saw pulmonology Oscar [name]) 04/25 - advised to f/u with pulmonology about getting sleep study updated so that he can get consistent supplies  4: Diabetes- - A1c 12/03/23 was 8.5%  5: Atrial fibrillation- - saw cardiology Oscar [name]) 06/25 - currently taking amiodarone  200mg  daily  - adamant that he does not want to resume apixaban ; aware of stroke risk - last eye exam ~ 03/25 - TSH 02/24/24 was 4.803  6: VT- - Mg 12/06/23 reviewed and was 2.3 - 8 months ICD estimated battery longevity. Referred to Dr. Debby, per [facility] cardiology, to evaluate for CRT-D upgrade with HFrEF and LBBB.    7: Hyperlipidemia- - LDL 02/24/24 was 157 - continue atorvastatin  80mg  daily (increased 06/25)     Ellouise DELENA Class, FNP 04/17/24

## 2024-04-18 ENCOUNTER — Ambulatory Visit: Attending: Family | Admitting: Family

## 2024-04-18 ENCOUNTER — Encounter

## 2024-04-18 ENCOUNTER — Encounter: Payer: Self-pay | Admitting: *Deleted

## 2024-04-18 ENCOUNTER — Encounter: Payer: Self-pay | Admitting: Family

## 2024-04-18 VITALS — BP 86/66 | HR 72 | Wt 311.4 lb

## 2024-04-18 DIAGNOSIS — I471 Supraventricular tachycardia, unspecified: Secondary | ICD-10-CM | POA: Diagnosis not present

## 2024-04-18 DIAGNOSIS — Z87891 Personal history of nicotine dependence: Secondary | ICD-10-CM | POA: Diagnosis not present

## 2024-04-18 DIAGNOSIS — I5022 Chronic systolic (congestive) heart failure: Secondary | ICD-10-CM | POA: Insufficient documentation

## 2024-04-18 DIAGNOSIS — E785 Hyperlipidemia, unspecified: Secondary | ICD-10-CM | POA: Insufficient documentation

## 2024-04-18 DIAGNOSIS — I428 Other cardiomyopathies: Secondary | ICD-10-CM | POA: Insufficient documentation

## 2024-04-18 DIAGNOSIS — Z8249 Family history of ischemic heart disease and other diseases of the circulatory system: Secondary | ICD-10-CM | POA: Insufficient documentation

## 2024-04-18 DIAGNOSIS — M109 Gout, unspecified: Secondary | ICD-10-CM | POA: Insufficient documentation

## 2024-04-18 DIAGNOSIS — Z5986 Financial insecurity: Secondary | ICD-10-CM | POA: Insufficient documentation

## 2024-04-18 DIAGNOSIS — E782 Mixed hyperlipidemia: Secondary | ICD-10-CM

## 2024-04-18 DIAGNOSIS — E1122 Type 2 diabetes mellitus with diabetic chronic kidney disease: Secondary | ICD-10-CM | POA: Diagnosis not present

## 2024-04-18 DIAGNOSIS — Z794 Long term (current) use of insulin: Secondary | ICD-10-CM | POA: Diagnosis not present

## 2024-04-18 DIAGNOSIS — Z95810 Presence of automatic (implantable) cardiac defibrillator: Secondary | ICD-10-CM | POA: Insufficient documentation

## 2024-04-18 DIAGNOSIS — I447 Left bundle-branch block, unspecified: Secondary | ICD-10-CM | POA: Insufficient documentation

## 2024-04-18 DIAGNOSIS — I252 Old myocardial infarction: Secondary | ICD-10-CM | POA: Diagnosis not present

## 2024-04-18 DIAGNOSIS — I10 Essential (primary) hypertension: Secondary | ICD-10-CM

## 2024-04-18 DIAGNOSIS — Z833 Family history of diabetes mellitus: Secondary | ICD-10-CM | POA: Diagnosis not present

## 2024-04-18 DIAGNOSIS — I130 Hypertensive heart and chronic kidney disease with heart failure and stage 1 through stage 4 chronic kidney disease, or unspecified chronic kidney disease: Secondary | ICD-10-CM | POA: Insufficient documentation

## 2024-04-18 DIAGNOSIS — N183 Chronic kidney disease, stage 3 unspecified: Secondary | ICD-10-CM | POA: Insufficient documentation

## 2024-04-18 DIAGNOSIS — I480 Paroxysmal atrial fibrillation: Secondary | ICD-10-CM | POA: Insufficient documentation

## 2024-04-18 DIAGNOSIS — Z5941 Food insecurity: Secondary | ICD-10-CM | POA: Diagnosis not present

## 2024-04-18 DIAGNOSIS — Z79899 Other long term (current) drug therapy: Secondary | ICD-10-CM | POA: Insufficient documentation

## 2024-04-18 DIAGNOSIS — E119 Type 2 diabetes mellitus without complications: Secondary | ICD-10-CM

## 2024-04-18 DIAGNOSIS — I472 Ventricular tachycardia, unspecified: Secondary | ICD-10-CM

## 2024-04-18 DIAGNOSIS — Z8616 Personal history of COVID-19: Secondary | ICD-10-CM | POA: Diagnosis not present

## 2024-04-18 DIAGNOSIS — Z7984 Long term (current) use of oral hypoglycemic drugs: Secondary | ICD-10-CM | POA: Insufficient documentation

## 2024-04-18 DIAGNOSIS — G4733 Obstructive sleep apnea (adult) (pediatric): Secondary | ICD-10-CM | POA: Diagnosis not present

## 2024-04-18 DIAGNOSIS — K219 Gastro-esophageal reflux disease without esophagitis: Secondary | ICD-10-CM | POA: Insufficient documentation

## 2024-04-18 DIAGNOSIS — J4489 Other specified chronic obstructive pulmonary disease: Secondary | ICD-10-CM | POA: Insufficient documentation

## 2024-04-18 MED ORDER — MIDODRINE HCL 5 MG PO TABS: 5.0000 mg | ORAL_TABLET | Freq: Two times a day (BID) | ORAL | Status: DC

## 2024-04-18 NOTE — Progress Notes (Signed)
 ReDS Vest / Clip - 04/18/24 0800       ReDS Vest / Clip   Station Marker D    Ruler Value 39    ReDS Value Range High volume overload    ReDS Actual Value 47

## 2024-04-18 NOTE — Patient Instructions (Addendum)
 Medication Changes:  STOP Farxiga   START Midodrine  5mg  (1 tab) two times daily unless your top number of your blood pressure is greater than 120.  FOR TWO DAYS ONLY INCREASE Torsemide  80mg  (4 tabs) daily THEN RESUME Torsemide  40mg  (2 tabs) daily.    Follow-Up in: Please follow up Wednesday 04/20/24 at 8:30.   Thank you for choosing [facility].    At the Advanced Heart Failure Clinic, you and your health needs are our priority. We have a designated team specialized in the treatment of Heart Failure. This Care Team includes your primary Heart Failure Specialized Cardiologist (physician), Advanced Practice Providers (APPs- Physician Assistants and Nurse Practitioners), and Pharmacist who all work together to provide you with the care you need, when you need it.   You may see any of the following providers on your designated Care Team at your next follow up:  Dr. Toribio Fuel Dr. Ezra Shuck Dr. Ria Commander Dr. Morene Brownie Ellouise Class, FNP Jaun Bash, RPH-CPP  Please be sure to bring in all your medications bottles to every appointment.   Need to Contact Us :  If you have any questions or concerns before your next appointment please send us  a message through [place] or call our office at [phone].    TO LEAVE A MESSAGE FOR THE NURSE SELECT OPTION 2, PLEASE LEAVE A MESSAGE INCLUDING: YOUR NAME DATE OF BIRTH CALL BACK NUMBER REASON FOR CALL**this is important as we prioritize the call backs  YOU WILL RECEIVE A CALL BACK THE SAME DAY AS LONG AS YOU CALL BEFORE 4:00 PM

## 2024-04-19 ENCOUNTER — Telehealth: Payer: Self-pay | Admitting: Family

## 2024-04-19 NOTE — Telephone Encounter (Signed)
 Called to confirm/remind patient of their appointment at the Advanced [facility] Failure Clinic on 04/20/24.   Appointment:   [] Confirmed  [x] Left mess   [] No answer/No voice mail  [] VM Full/unable to leave message  [] Phone not in service  Patient reminded to bring all medications and/or complete list.  Confirmed patient has transportation. Gave directions, instructed to utilize valet parking.

## 2024-04-19 NOTE — Progress Notes (Unsigned)
 Advanced Heart Failure Clinic Note    PCP: Antone Bottoms, MD  Primary Cardiologist: Florencio Kava, MD / Elodie Ronal Maxwell, NP (last seen 06/25; returns 09/25) HF provider: Bulah Cornet, MD (last seen 05/24)  Chief Complaint: shortness of breath   HPI:  Oscar [name] is a 48 y/o male with a history of obstructive sleep apnea (currently with CPAP), MI, HTN, VT (successful shock X 3), CKD, COPD, PAF, gout, hyperlipidemia, GERD, IDDM, SVT,asthma,  hidradenitis suppurative, chronic LBBB and chronic heart failure with CRT-D (2016).  Stress test 03/01/2019: Abnormal myocardial perfusion scan no clear evidence of reversible ischemia inferior persistent defect either scar versus body habitus artifact severely dilated left ventricular function severely depressed LV function with ejection fraction of around 11%. Recommend medical therapy   Echo 03/01/2019: EF of 15% along with mild Oscar.  Echo 02/27/20: EF of <20% Echo 11/23/20: EF of <20% along with moderate LVH and moderately elevated PA pressure. Echo 10/20/22: EF <20% along with mildly elevated PA pressure and mild Oscar.   Was in the ED 04/27/23 due to groin abscess X 1 month with drainage. CT done with f/u to be done with surgeon.   Admitted 06/12/23 due to ICD firing after sudden onset of SOB, chest tightness and dizziness. He was able to stand up and make it to the bathroom when he felt his ICD firing again. Afterwards, his shortness of breath, chest pain and dizziness resolved. He called EMS and on route to the ED, he was told that his ICD fired again. Successful conversion after the 3rd shock. Cardiology consulted. Needed amiodarone  drip with transition to or amiodarone .   CPET done 10/09/23 revealed sub-maximal exercise test, so maximal cardiorespiratory capacity could not be determined. Moderate to severe functional impairment appreciated. There were signs of a mixed cardiopulmonary limitation to exercise.   Was in the ED 11/07/23 with right  elbow pain. Has a history of gout. Was given colchicine  but has to wait 3 days before starting because he had been taking it daily for several weeks.  Admitted 12/01/23 with HF/ COPD exacerbation. IV diuresed. Cardiology consulted. IV solu-medrol  given with nebulizer, antibiotics. Given prednisone  taper at discharge. Troponin elevated due to demand ischemia. Echo 12/01/23: EF 30-35% with normal RV, mild Oscar  Was in the ED 12/03/23 with an episode of chest pain when getting in the shower. Also had lightheadedness and shortness of breath. Trponin 80 & then 130. EKG without acute ischemia. SOB improved and he was released.   Admitted 12/04/23 with worsening shortness of breath. Found to be + covid. Given IV lasix  for a/c heart failure. Given IV steroids with transition to oral prednisone  for COPD exacerbation. Had episode of chest pain, improved after giving bronchodilator. Troponin trending down not consistent with ACS.   Seen in [facility] 04/25 and had torsemide  decreased to 20mg  daily after getting BMET back.   Seen in [facility] 06/25 and sent for IV lasix . Amlodipine  and losartan  were both stopped due to hypotension.   Seen in [facility] 04/18/24 fluid up with elevated ReDs reading and worsening symptoms where torsemide  was increased to 80mg  daily and farxiga  was stopped. Midodrine  started BID as he rarely took it because he wasn't checking his BP.   He presents today for a HF follow-up visit with a chief complaint of shortness of breath (improving). Has associated fatigue, occasional palpitations, dizziness, abdominal swelling (improving), neck pain, right sided low back pain. Taking his midodrine  BID although hasn't taken any medications yet this morning.  Drinking 48 ounces of fluid daily. Using Mrs Hollis for seasoning. He says that he has continued to wear his NIV nightly.   ROS: All systems negative except as listed in HPI, PMH and Problem List.  SH:  Social History   Socioeconomic History   Marital status: Single     Spouse name: Not on file   Number of children: 0   Years of education: Not on file   Highest education level: Some college, no degree  Occupational History   Occupation: unemployed  Tobacco Use   Smoking status: Former    Current packs/day: 0.00    Average packs/day: 1.5 packs/day for 34.0 years (51.0 ttl pk-yrs)    Types: Cigarettes    Quit date: 05/2023    Years since quitting: 0.9    Passive exposure: Past   Smokeless tobacco: Never   Tobacco comments:    Quit smoking 05/2023  Vaping Use   Vaping status: Never Used  Substance and Sexual Activity   Alcohol use: No   Drug use: No   Sexual activity: Yes  Other Topics Concern   Not on file  Social History Narrative   Live alone   Social Drivers of Health   Financial Resource Strain: Medium Risk (12/02/2023)   Overall Financial Resource Strain (CARDIA)    Difficulty of Paying Living Expenses: Somewhat hard  Food Insecurity: No Food Insecurity (12/04/2023)   Hunger Vital Sign    Worried About Running Out of Food in the Last Year: Never true    Ran Out of Food in the Last Year: Never true  Recent Concern: Food Insecurity - Food Insecurity Present (11/23/2023)   Received from [facility]   Hunger Vital Sign    Within the past 12 months, you worried that your food would run out before you got the money to buy more.: Sometimes true    Within the past 12 months, the food you bought just didn't last and you didn't have money to get more.: Sometimes true  Transportation Needs: No Transportation Needs (12/04/2023)   PRAPARE - Administrator, Civil Service (Medical): No    Lack of Transportation (Non-Medical): No  Physical Activity: Not on file  Stress: Not on file  Social Connections: Moderately Isolated (12/04/2023)   Social Connection and Isolation Panel    Frequency of Communication with Friends and Family: More than three times a week    Frequency of Social Gatherings with Friends and Family: Once a  week    Attends Religious Services: More than 4 times per year    Active Member of [employer] or Organizations: No    Attends Banker Meetings: Never    Marital Status: Never married  Intimate Partner Violence: Not At Risk (12/04/2023)   Humiliation, Afraid, Rape, and Kick questionnaire    Fear of Current or Ex-Partner: No    Emotionally Abused: No    Physically Abused: No    Sexually Abused: No    FH:  Family History  Problem Relation Age of Onset   Hypertension Mother    Congestive Heart Failure Mother    Hypertension Sister    Diabetes Sister    Pancreatitis Sister    COPD Sister    Emphysema Sister        smoked   Pancreatitis Brother    Anemia Neg Hx    Arrhythmia Neg Hx    Asthma Neg Hx    Clotting disorder Neg Hx  Fainting Neg Hx    Heart attack Neg Hx    Heart disease Neg Hx    Heart failure Neg Hx    Hyperlipidemia Neg Hx     Past Medical History:  Diagnosis Date   AICD (automatic cardioverter/defibrillator) present    Aortic atherosclerosis (HCC)    Asthma    Chronic respiratory failure (HCC)    CKD (chronic kidney disease), stage III (HCC)    COPD (chronic obstructive pulmonary disease) (HCC)    Deafness in right ear    Diabetes mellitus without complication (HCC)    Dyspnea    GERD (gastroesophageal reflux disease)    Gout    HFrEF (heart failure with reduced ejection fraction) (HCC)    a.) TTE 12/10/14: EF 25%; diff inf HK; sev LV and mod LA dil; LVH. b.) TTE 06/23/18: EF 20-25%; LVH; mild LV dil, mild BAE. c.) TTE 03/01/19: EF 15%, LVH, BAE; triv PR/TR. d.) TTE 02/27/20: EF < 20%; sev LV dil; mild Oscar. e.) TTE 11/23/20: EF < 20%; glob HK; sev LV dil; LVH; mild-mod BAE; mod-sev TR, triv AR; G1DD. f.) TTE 05/14/21: EF <15%; LVH; sev LA and mild RV enlar; triv AR/PR, mild TR, mod Oscar.   Hiatal hernia    History of cardiac catheterization    a.) R/LHC 12/14/2009: normal coronaries. b.) R/LHC 12/11/2014: normal coronaries.   Hyperlipidemia     Hypertension    Hypoxemia    LBBB (left bundle branch block)    NICM (nonischemic cardiomyopathy; dilated cardiomyopathy) (HCC)    a.) R/LHC 12/14/2009: normal cors; LVEDP 18 mmHg, mean PA 29 mmHg, mean PCWP 31 mmHg; CO 6 L/min; CI 2.54 L/min/m. b.) TTE 12/10/2014: EF 25%. c.) R/LHC 12/11/2014: mean RA 9 mmHg, mean PA 22 mmHg, mean PCWP 22 mmHg. d.) TTE 06/23/2018: EF 20-25%. e.) TTE 03/01/2019: EF 15%. f.) TTE 02/27/2020: < 20%. g.) TTE 11/23/2020: EF <20%. h.) TTE 05/14/2021: < 15%.   NSTEMI (non-ST elevated myocardial infarction) (HCC)    a.) x 3 per patient report ---> 2012, 2014, 2016   Obesity    On supplemental oxygen by nasal cannula    a.) 2-3 L/[place] PRN   OSA on CPAP    PAF (paroxysmal atrial fibrillation) (HCC)    a.) CHA2DS2-VASc = 4 (HFrEF, HTN, prior MI, T2DM). b.) rate/rhythm maintained without pharmacologial intervention; no current anticoagulation.   Pancreatitis    PSVT (paroxysmal supraventricular tachycardia) (HCC)     Current Outpatient Medications  Medication Sig Dispense Refill   albuterol  (VENTOLIN  HFA) 108 (90 Base) MCG/ACT inhaler Inhale 2 puffs into the lungs every 6 (six) hours as needed for wheezing or shortness of breath. 8 g 2   allopurinol  (ZYLOPRIM ) 100 MG tablet Take 100 mg by mouth daily.     amiodarone  (PACERONE ) 200 MG tablet Take 1 tablet (200 mg total) by mouth daily.     atorvastatin  (LIPITOR ) 80 MG tablet Take 1 tablet (80 mg total) by mouth at bedtime. 90 tablet 3   colchicine  0.6 MG tablet Take 0.6 mg by mouth daily.     doxycycline  (ADOXA) 100 MG tablet Take 100 mg by mouth 2 (two) times daily.     Fluticasone -Umeclidin-Vilant (TRELEGY ELLIPTA ) 100-62.5-25 MCG/ACT AEPB Inhale 1 puff into the lungs daily. 60 each 11   insulin  glargine (LANTUS ) 100 UNIT/ML injection Inject 15 Units into the skin daily.     metoprolol  succinate (TOPROL -XL) 25 MG 24 hr tablet Take 1 tablet (25 mg total) by mouth daily. Take  with or immediately following a meal. 30  tablet 11   midodrine  (PROAMATINE ) 5 MG tablet Take 1 tablet (5 mg total) by mouth 2 (two) times daily with a meal. Stop if SBP is >120     NON FORMULARY Pt uses a cpap nightly     pantoprazole  (PROTONIX ) 40 MG tablet Take 1 tablet (40 mg total) by mouth daily.     sacubitril -valsartan  (ENTRESTO ) 24-26 MG Take 1 tablet by mouth 2 (two) times daily. (Patient not taking: Reported on 04/18/2024) 60 tablet 3   torsemide  (DEMADEX ) 20 MG tablet Take 40 mg by mouth daily.     No current facility-administered medications for this visit.   Vitals:   04/20/24 0840  BP: 91/74  Pulse: 75  SpO2: 96%  Weight: (!) 310 lb 3.2 oz (140.7 kg)   Wt Readings from Last 3 Encounters:  04/20/24 (!) 310 lb 3.2 oz (140.7 kg)  04/18/24 (!) 311 lb 6.4 oz (141.3 kg)  04/04/24 (!) 308 lb 8 oz (139.9 kg)   Lab Results  Component Value Date   CREATININE 1.89 (H) 04/20/2024   CREATININE 1.90 (H) 04/04/2024   CREATININE 1.83 (H) 02/24/2024    PHYSICAL EXAM:  General: Well appearing. NAD, no SOB when talking Cor: No JVD. Regular rhythm, rate.  Lungs: clear Abdomen: soft, nontender, distended although improved. Extremities: no edema Neuro:. Affect pleasant   ECG: not done  ReDs reading: 52 %, abnormal (see below)    ASSESSMENT & PLAN:  1: NICM with reduced ejection fraction- - NYHA class III - continues to be fluid up with elevated ReDs and symptoms although question ReDs reading as it doesn't match with symptoms - weight down 1.2 pounds from last visit here 2 days ago - ReDs 52%; 2 days ago it was 47%. He doesn't appear to be severely symptomatic and he does have a broad chest so am wondering if the reading is accurate - Stress test 03/01/2019:   Abnormal myocardial perfusion scan no clear evidence of reversible ischemia inferior persistent defect either scar versus body habitus artifact severely dilated left ventricular function severely depressed LV function with ejection fraction of around 11%.    - Echo 03/01/2019: EF of 15% along with mild Oscar.  - Echo 02/27/20: EF of <20% - Echo 11/23/20: EF of <20% along with moderate LVH and moderately elevated PA pressure. - Echo 10/20/22: EF <20% along with mildly elevated PA pressure and mild Oscar.  - Echo 12/01/23: EF 30-35% with normal RV, mild Oscar - AICD (2016) in place but denies any shocks in a long time - GDMT limited by hypotension and CKD - farxiga  stopped at last visit due to continued groin infection and possibly contributing to his hypotension - continue metoprolol  succinate 25mg  daily  - continue torsemide  40mg  daily.  - based on symptoms, will give metolazone  2.5mg  every other day X 3 doses. Will get BMET today to evaluate whether he needs potassium supplement - in the past, spironolactone , losartan , entresto  has caused hypotension - saw HF provider Pearl) 05/24 - BNP 02/04/24 was 357.2. BNP today  2: HTN with CKD- - BP 91/74 - take midodrine  5mg  BID unless SBP is >120.  - sees PCP (Mancheno) at [facility]  - BMP 04/04/24 reviewed: sodium 139, potassium 4.0, Cr 1.9 and GFR 43. BMET today - saw nephrology Kolleen) 08/25  3: Obstructive sleep apnea- - wears nocturnal ventilator QHS, ~ 6-8 hours/night; reports sleeping well - continues wearing oxygen at 3L at bedtime &  PRN during the day - saw pulmonology Gayl) 04/25  4: Diabetes (managed by PCP)- - A1c 12/03/23 was 8.5% - continue lantus  15 units daily   5: Atrial fibrillation- - saw cardiology Debby) 06/25 - continue amiodarone  200mg  daily  - adamant that he does not want to resume apixaban ; aware of stroke risk - last eye exam ~ 03/25 - TSH 02/24/24 was 4.803  6: VT- - Mg 12/06/23 reviewed and was 2.3 - 8 months ICD estimated battery longevity. Sees EP, Dr. Debby 09/25 to evaluate for CRT-D upgrade with HFrEF and LBBB.    7: Hyperlipidemia- - LDL 02/24/24 was 157 - continue atorvastatin  80mg  daily (increased 06/25)   Return in 1 week, sooner if needed.    Ellouise DELENA Class, FNP 04/19/24

## 2024-04-20 ENCOUNTER — Ambulatory Visit: Payer: Self-pay | Admitting: Family

## 2024-04-20 ENCOUNTER — Encounter: Payer: Self-pay | Admitting: Family

## 2024-04-20 ENCOUNTER — Other Ambulatory Visit
Admission: RE | Admit: 2024-04-20 | Discharge: 2024-04-20 | Disposition: A | Discharge status: Home / Self Care | Source: Ambulatory Visit | Attending: Family | Admitting: Family

## 2024-04-20 ENCOUNTER — Encounter

## 2024-04-20 ENCOUNTER — Ambulatory Visit (HOSPITAL_BASED_OUTPATIENT_CLINIC_OR_DEPARTMENT_OTHER): Admitting: Family

## 2024-04-20 VITALS — BP 91/74 | HR 75 | Wt 310.2 lb

## 2024-04-20 DIAGNOSIS — Z794 Long term (current) use of insulin: Secondary | ICD-10-CM

## 2024-04-20 DIAGNOSIS — G4733 Obstructive sleep apnea (adult) (pediatric): Secondary | ICD-10-CM

## 2024-04-20 DIAGNOSIS — I5022 Chronic systolic (congestive) heart failure: Secondary | ICD-10-CM | POA: Diagnosis present

## 2024-04-20 DIAGNOSIS — E782 Mixed hyperlipidemia: Secondary | ICD-10-CM

## 2024-04-20 DIAGNOSIS — E119 Type 2 diabetes mellitus without complications: Secondary | ICD-10-CM

## 2024-04-20 DIAGNOSIS — I472 Ventricular tachycardia, unspecified: Secondary | ICD-10-CM

## 2024-04-20 DIAGNOSIS — I10 Essential (primary) hypertension: Secondary | ICD-10-CM

## 2024-04-20 DIAGNOSIS — I480 Paroxysmal atrial fibrillation: Secondary | ICD-10-CM

## 2024-04-20 LAB — BRAIN NATRIURETIC PEPTIDE: B Natriuretic Peptide: 392.9 pg/mL — ABNORMAL HIGH (ref 0.0–100.0)

## 2024-04-20 LAB — BASIC METABOLIC PANEL WITH GFR
Anion gap: 11 (ref 5–15)
BUN: 49 mg/dL — ABNORMAL HIGH (ref 6–20)
CO2: 25 mmol/L (ref 22–32)
Calcium: 9 mg/dL (ref 8.9–10.3)
Chloride: 100 mmol/L (ref 98–111)
Creatinine, Ser: 1.89 mg/dL — ABNORMAL HIGH (ref 0.61–1.24)
GFR, Estimated: 43 mL/min — ABNORMAL LOW (ref >60)
Glucose, Bld: 198 mg/dL — ABNORMAL HIGH (ref 70–99)
Potassium: 3.9 mmol/L (ref 3.5–5.1)
Sodium: 136 mmol/L (ref 135–145)

## 2024-04-20 MED ORDER — POTASSIUM CHLORIDE CRYS ER 20 MEQ PO TBCR: 40.0000 meq | EXTENDED_RELEASE_TABLET | ORAL | 0 refills | Status: DC

## 2024-04-20 MED ORDER — METOLAZONE 2.5 MG PO TABS: 2.5000 mg | ORAL_TABLET | ORAL | 0 refills | Status: DC

## 2024-04-20 NOTE — Telephone Encounter (Signed)
 Spoke to pt about adding potassium 40meq every other day while taking metolazone . Pt agreeable. No further questions at this time.

## 2024-04-20 NOTE — Patient Instructions (Signed)
 Medication Changes:  START Metolazone  2.5mg  (1 tab) every other day for 3 days ONLY  Lab Work:  Go over to the [facility]. Go pass the gift shop and have your blood work completed.  We will only call you if the results are abnormal or if the provider would like to make medication changes.  No news is good news.    Follow-Up in: Please follow up with the Advanced [facility] Failure Clinic in 1 week with Ellouise Class, FNP.   Thank you for choosing [facility].    At the Advanced Heart Failure Clinic, you and your health needs are our priority. We have a designated team specialized in the treatment of Heart Failure. This Care Team includes your primary Heart Failure Specialized Cardiologist (physician), Advanced Practice Providers (APPs- Physician Assistants and Nurse Practitioners), and Pharmacist who all work together to provide you with the care you need, when you need it.   You may see any of the following providers on your designated Care Team at your next follow up:  Dr. Toribio Fuel Dr. Ezra Shuck Dr. Ria Commander Dr. Morene Brownie Ellouise Class, FNP Jaun Bash, RPH-CPP  Please be sure to bring in all your medications bottles to every appointment.   Need to Contact Us :  If you have any questions or concerns before your next appointment please send us  a message through [place] or call our office at [phone].    TO LEAVE A MESSAGE FOR THE NURSE SELECT OPTION 2, PLEASE LEAVE A MESSAGE INCLUDING: YOUR NAME DATE OF BIRTH CALL BACK NUMBER REASON FOR CALL**this is important as we prioritize the call backs  YOU WILL RECEIVE A CALL BACK THE SAME DAY AS LONG AS YOU CALL BEFORE 4:00 PM

## 2024-04-25 ENCOUNTER — Encounter

## 2024-04-26 ENCOUNTER — Telehealth: Payer: Self-pay | Admitting: Family

## 2024-04-26 NOTE — Progress Notes (Unsigned)
 Advanced [facility] Failure Clinic Note    PCP: Antone Bottoms, MD  Primary Cardiologist: Florencio Kava, MD / Elodie Ronal Maxwell, NP (last seen 06/25; returns 09/25) HF provider: Bulah Cornet, MD (last seen 05/24)  Chief Complaint:    HPI:  [name] is a 48 y/o male with a history of obstructive sleep apnea (currently with CPAP), MI, HTN, VT (successful shock X 3), CKD, COPD, PAF, gout, hyperlipidemia, GERD, IDDM, SVT,asthma,  hidradenitis suppurative, chronic LBBB and chronic heart failure with CRT-D (2016).  Stress test 03/01/2019: Abnormal myocardial perfusion scan no clear evidence of reversible ischemia inferior persistent defect either scar versus body habitus artifact severely dilated left ventricular function severely depressed LV function with ejection fraction of around 11%. Recommend medical therapy   Echo 03/01/2019: EF of 15% along with mild MR.  Echo 02/27/20: EF of <20% Echo 11/23/20: EF of <20% along with moderate LVH and moderately elevated PA pressure. Echo 10/20/22: EF <20% along with mildly elevated PA pressure and mild MR.   Was in the ED 04/27/23 due to groin abscess X 1 month with drainage. CT done with f/u to be done with surgeon.   Admitted 06/12/23 due to ICD firing after sudden onset of SOB, chest tightness and dizziness. He was able to stand up and make it to the bathroom when he felt his ICD firing again. Afterwards, his shortness of breath, chest pain and dizziness resolved. He called EMS and on route to the ED, he was told that his ICD fired again. Successful conversion after the 3rd shock. Cardiology consulted. Needed amiodarone  drip with transition to or amiodarone .   CPET done 10/09/23 revealed sub-maximal exercise test, so maximal cardiorespiratory capacity could not be determined. Moderate to severe functional impairment appreciated. There were signs of a mixed cardiopulmonary limitation to exercise.   Was in the ED 11/07/23 with right elbow pain. Has a  history of gout. Was given colchicine  but has to wait 3 days before starting because he had been taking it daily for several weeks.  Admitted 12/01/23 with HF/ COPD exacerbation. IV diuresed. Cardiology consulted. IV solu-medrol  given with nebulizer, antibiotics. Given prednisone  taper at discharge. Troponin elevated due to demand ischemia. Echo 12/01/23: EF 30-35% with normal RV, mild MR  Was in the ED 12/03/23 with an episode of chest pain when getting in the shower. Also had lightheadedness and shortness of breath. Trponin 80 & then 130. EKG without acute ischemia. SOB improved and he was released.   Admitted 12/04/23 with worsening shortness of breath. Found to be + covid. Given IV lasix  for a/c heart failure. Given IV steroids with transition to oral prednisone  for COPD exacerbation. Had episode of chest pain, improved after giving bronchodilator. Troponin trending down not consistent with ACS.   Seen in [facility] 04/25 and had torsemide  decreased to 20mg  daily after getting BMET back.   Seen in [facility] 06/25 and sent for IV lasix . Amlodipine  and losartan  were both stopped due to hypotension.   Seen in [facility] 04/18/24 fluid up with elevated ReDs reading and worsening symptoms where torsemide  was increased to 80mg  daily and farxiga  was stopped. Midodrine  started BID as he rarely took it because he wasn't checking his BP.   He presents today for a HF follow-up visit with a chief complaint of improved shortness of breath.   Reports overall feeling better. Has taken Torsemide  and Midodrine  as ordered.   Adequate rest but sleeps with 6 pillows. Notes no further decline to quality of life and activity.  Has noted decline in weight of about 6lbs. Continues drinking 48 ounces of fluid daily an using Mrs Hollis for seasoning. He says that he has continued to wear his NIV nightly.   Has upcoming appointment with Pulmonology and Cardiology.    ROS: All systems negative except as listed in HPI, PMH and Problem  List.  SH:  Social History   Socioeconomic History   Marital status: Single    Spouse name: Not on file   Number of children: 0   Years of education: Not on file   Highest education level: Some college, no degree  Occupational History   Occupation: unemployed  Tobacco Use   Smoking status: Former    Current packs/day: 0.00    Average packs/day: 1.5 packs/day for 34.0 years (51.0 ttl pk-yrs)    Types: Cigarettes    Quit date: 05/2023    Years since quitting: 0.9    Passive exposure: Past   Smokeless tobacco: Never   Tobacco comments:    Quit smoking 05/2023  Vaping Use   Vaping status: Never Used  Substance and Sexual Activity   Alcohol use: No   Drug use: No   Sexual activity: Yes  Other Topics Concern   Not on file  Social History Narrative   Live alone   Social Drivers of Health   Financial Resource Strain: Medium Risk (12/02/2023)   Overall Financial Resource Strain (CARDIA)    Difficulty of Paying Living Expenses: Somewhat hard  Food Insecurity: No Food Insecurity (12/04/2023)   Hunger Vital Sign    Worried About Running Out of Food in the Last Year: Never true    Ran Out of Food in the Last Year: Never true  Recent Concern: Food Insecurity - Food Insecurity Present (11/23/2023)   Received from [facility]   Hunger Vital Sign    Within the past 12 months, you worried that your food would run out before you got the money to buy more.: Sometimes true    Within the past 12 months, the food you bought just didn't last and you didn't have money to get more.: Sometimes true  Transportation Needs: No Transportation Needs (12/04/2023)   PRAPARE - Administrator, Civil Service (Medical): No    Lack of Transportation (Non-Medical): No  Physical Activity: Not on file  Stress: Not on file  Social Connections: Moderately Isolated (12/04/2023)   Social Connection and Isolation Panel    Frequency of Communication with Friends and Family: More than  three times a week    Frequency of Social Gatherings with Friends and Family: Once a week    Attends Religious Services: More than 4 times per year    Active Member of [employer] or Organizations: No    Attends Banker Meetings: Never    Marital Status: Never married  Intimate Partner Violence: Not At Risk (12/04/2023)   Humiliation, Afraid, Rape, and Kick questionnaire    Fear of Current or Ex-Partner: No    Emotionally Abused: No    Physically Abused: No    Sexually Abused: No    FH:  Family History  Problem Relation Age of Onset   Hypertension Mother    Congestive Heart Failure Mother    Hypertension Sister    Diabetes Sister    Pancreatitis Sister    COPD Sister    Emphysema Sister        smoked   Pancreatitis Brother    Anemia Neg Hx  Arrhythmia Neg Hx    Asthma Neg Hx    Clotting disorder Neg Hx    Fainting Neg Hx    Heart attack Neg Hx    Heart disease Neg Hx    Heart failure Neg Hx    Hyperlipidemia Neg Hx     Past Medical History:  Diagnosis Date   AICD (automatic cardioverter/defibrillator) present    Aortic atherosclerosis (HCC)    Asthma    Chronic respiratory failure (HCC)    CKD (chronic kidney disease), stage III (HCC)    COPD (chronic obstructive pulmonary disease) (HCC)    Deafness in right ear    Diabetes mellitus without complication (HCC)    Dyspnea    GERD (gastroesophageal reflux disease)    Gout    HFrEF (heart failure with reduced ejection fraction) (HCC)    a.) TTE 12/10/14: EF 25%; diff inf HK; sev LV and mod LA dil; LVH. b.) TTE 06/23/18: EF 20-25%; LVH; mild LV dil, mild BAE. c.) TTE 03/01/19: EF 15%, LVH, BAE; triv PR/TR. d.) TTE 02/27/20: EF < 20%; sev LV dil; mild MR. e.) TTE 11/23/20: EF < 20%; glob HK; sev LV dil; LVH; mild-mod BAE; mod-sev TR, triv AR; G1DD. f.) TTE 05/14/21: EF <15%; LVH; sev LA and mild RV enlar; triv AR/PR, mild TR, mod MR.   Hiatal hernia    History of cardiac catheterization    a.) R/LHC 12/14/2009:  normal coronaries. b.) R/LHC 12/11/2014: normal coronaries.   Hyperlipidemia    Hypertension    Hypoxemia    LBBB (left bundle branch block)    NICM (nonischemic cardiomyopathy; dilated cardiomyopathy) (HCC)    a.) R/LHC 12/14/2009: normal cors; LVEDP 18 mmHg, mean PA 29 mmHg, mean PCWP 31 mmHg; CO 6 L/min; CI 2.54 L/min/m. b.) TTE 12/10/2014: EF 25%. c.) R/LHC 12/11/2014: mean RA 9 mmHg, mean PA 22 mmHg, mean PCWP 22 mmHg. d.) TTE 06/23/2018: EF 20-25%. e.) TTE 03/01/2019: EF 15%. f.) TTE 02/27/2020: < 20%. g.) TTE 11/23/2020: EF <20%. h.) TTE 05/14/2021: < 15%.   NSTEMI (non-ST elevated myocardial infarction) (HCC)    a.) x 3 per patient report ---> 2012, 2014, 2016   Obesity    On supplemental oxygen by nasal cannula    a.) 2-3 L/[place] PRN   OSA on CPAP    PAF (paroxysmal atrial fibrillation) (HCC)    a.) CHA2DS2-VASc = 4 (HFrEF, HTN, prior MI, T2DM). b.) rate/rhythm maintained without pharmacologial intervention; no current anticoagulation.   Pancreatitis    PSVT (paroxysmal supraventricular tachycardia) (HCC)     Current Outpatient Medications  Medication Sig Dispense Refill   albuterol  (VENTOLIN  HFA) 108 (90 Base) MCG/ACT inhaler Inhale 2 puffs into the lungs every 6 (six) hours as needed for wheezing or shortness of breath. 8 g 2   allopurinol  (ZYLOPRIM ) 100 MG tablet Take 100 mg by mouth daily.     amiodarone  (PACERONE ) 200 MG tablet Take 1 tablet (200 mg total) by mouth daily.     atorvastatin  (LIPITOR ) 80 MG tablet Take 1 tablet (80 mg total) by mouth at bedtime. 90 tablet 3   colchicine  0.6 MG tablet Take 0.6 mg by mouth daily.     doxycycline  (ADOXA) 100 MG tablet Take 100 mg by mouth 2 (two) times daily.     Fluticasone -Umeclidin-Vilant (TRELEGY ELLIPTA ) 100-62.5-25 MCG/ACT AEPB Inhale 1 puff into the lungs daily. 60 each 11   insulin  glargine (LANTUS ) 100 UNIT/ML injection Inject 15 Units into the skin daily.  metolazone  (ZAROXOLYN ) 2.5 MG tablet Take 1 tablet (2.5 mg  total) by mouth every other day. 3 tablet 0   metoprolol  succinate (TOPROL -XL) 25 MG 24 hr tablet Take 1 tablet (25 mg total) by mouth daily. Take with or immediately following a meal. 30 tablet 11   midodrine  (PROAMATINE ) 5 MG tablet Take 1 tablet (5 mg total) by mouth 2 (two) times daily with a meal. Stop if SBP is >120     NON FORMULARY Pt uses a cpap nightly     pantoprazole  (PROTONIX ) 40 MG tablet Take 1 tablet (40 mg total) by mouth daily.     potassium chloride  SA (KLOR-CON  M) 20 MEQ tablet Take 2 tablets (40 mEq total) by mouth as directed. Take 2 tabs with every dose of metolazone  15 tablet 0   sacubitril -valsartan  (ENTRESTO ) 24-26 MG Take 1 tablet by mouth 2 (two) times daily. (Patient not taking: Reported on 04/20/2024) 60 tablet 3   torsemide  (DEMADEX ) 20 MG tablet Take 40 mg by mouth 2 (two) times daily.     No current facility-administered medications for this visit.     PHYSICAL EXAM:  General: Well appearing, no SOB when talking ot acute signs of distress Cor: No JVD. Regular rhythm, rate.  Lungs: clear, symmetrical chest expansion, no cough. Abdomen: soft, nontender, distended although improved. Extremities: no edema Neuro:. Affect pleasant Skin: Appropriate for ethnicity   Today's ReDs reading: 50 %, abnormal but questionable given improvement of symptoms. BNP ordered today to confirm reading.   Vitals:   04/27/24 0815  BP: 94/76  Pulse: 75  SpO2: 96%  Weight: (!) 137.9 kg   Wt Readings from Last 3 Encounters:  04/27/24 (!) 137.9 kg  04/20/24 (!) 140.7 kg  04/18/24 (!) 141.3 kg     ECG: not done   ASSESSMENT & PLAN:  1: NICM with reduced ejection fraction- - NYHA class II -Continues to have elevated ReDs although ReDs reading as doesn't match with symptoms and weight decline.   ReDs 50%; today and was 47% 1 week ago. He doesn't appear to be severely symptomatic and he does have a broad chest so am wondering if the reading is accurate - Weight down 6  pounds from last visit here 1 week ago - BNP 04/20/24 was 392.9 from BNP 357.2 on 02/04/24. Repeat BNP ordered today. - Stress test 03/01/2019:   Abnormal myocardial perfusion scan no clear evidence of reversible ischemia inferior persistent defect either scar versus body habitus artifact severely dilated left ventricular function severely depressed LV function with ejection fraction of around 11%.   - Echo 03/01/2019: EF of 15% along with mild MR.  - Echo 02/27/20: EF of <20% - Echo 11/23/20: EF of <20% along with moderate LVH and moderately elevated PA pressure. - Echo 10/20/22: EF <20% along with mildly elevated PA pressure and mild MR.  - Echo 12/01/23: EF 30-35% with normal RV, mild MR - AICD (2016) in place but denies any shocks in a long time - GDMT limited by hypotension and CKD - farxiga  stopped at last visit due to continued groin infection and possibly contributing to his hypotension - continue metoprolol  succinate 25mg  daily  - continue torsemide  40mg  daily as directed -Will get BMET today to evaluate whether he needs potassium supplement - in the past, spironolactone , losartan , entresto  has caused hypotension - saw HF provider Pearl) 05/24   2: HTN with CKD- - BP 94/76 - Currently taking midodrine  5mg  BID unless SBP is >120.  -Following BMET  may discontinue Torsemide  for hypotension - BMP 04/20/24 reviewed: sodium 136, potassium 3.9, Cr 1.89 and GFR 43. BMET ordered today.  - saw nephrology Kolleen) 08/25 - sees PCP (Mancheno) at [facility]   3: Obstructive sleep apnea- - wears nocturnal ventilator QHS, ~ 6-8 hours/night; reports sleeping well - continues wearing oxygen at 3L at bedtime & PRN during the day - see's pulmonology Herlene) tomorrow 08/28  4: Diabetes (managed by PCP)- - A1c 12/03/23 was 8.5% - continue lantus  15 units daily   5: Atrial fibrillation- - saw cardiology Philippe) 08/25 - continue amiodarone  200mg  daily  - adamant that he does not want to  resume apixaban ; aware of stroke risk - last eye exam ~ 03/25 - TSH 02/24/24 was 4.803  6: VT- - Mg 12/06/23 reviewed and was 2.3 - 8 months ICD estimated battery longevity. Sees EP, Dr. Debby 09/25 to evaluate for CRT-D upgrade with HFrEF and LBBB.    7: Hyperlipidemia- - LDL 02/24/24 was 157 - continue atorvastatin  80mg  daily (increased 06/25)   Return in November, sooner if needed.   Solomon Blumenthal, RN 04/26/24

## 2024-04-26 NOTE — Telephone Encounter (Signed)
 Called to confirm/remind patient of their appointment at the [facility] on 04/27/24.   Appointment:   [x] Confirmed  [] Left mess   [] No answer/No voice mail  [] VM Full/unable to leave message  [] Phone not in service  Patient reminded to bring all medications and/or complete list.  Confirmed patient has transportation. Gave directions, instructed to utilize valet parking.

## 2024-04-27 ENCOUNTER — Ambulatory Visit: Payer: Self-pay | Admitting: Family

## 2024-04-27 ENCOUNTER — Other Ambulatory Visit
Admission: RE | Admit: 2024-04-27 | Discharge: 2024-04-27 | Disposition: A | Discharge status: Home / Self Care | Source: Ambulatory Visit | Attending: Family | Admitting: Family

## 2024-04-27 ENCOUNTER — Encounter

## 2024-04-27 ENCOUNTER — Encounter: Payer: Self-pay | Admitting: Family

## 2024-04-27 ENCOUNTER — Ambulatory Visit: Admitting: Family

## 2024-04-27 VITALS — BP 94/76 | HR 75 | Wt 304.0 lb

## 2024-04-27 DIAGNOSIS — I5022 Chronic systolic (congestive) heart failure: Secondary | ICD-10-CM

## 2024-04-27 DIAGNOSIS — E108 Type 1 diabetes mellitus with unspecified complications: Secondary | ICD-10-CM

## 2024-04-27 DIAGNOSIS — I10 Essential (primary) hypertension: Secondary | ICD-10-CM

## 2024-04-27 DIAGNOSIS — I480 Paroxysmal atrial fibrillation: Secondary | ICD-10-CM

## 2024-04-27 DIAGNOSIS — G4733 Obstructive sleep apnea (adult) (pediatric): Secondary | ICD-10-CM

## 2024-04-27 DIAGNOSIS — I472 Ventricular tachycardia, unspecified: Secondary | ICD-10-CM

## 2024-04-27 DIAGNOSIS — E782 Mixed hyperlipidemia: Secondary | ICD-10-CM

## 2024-04-27 LAB — BASIC METABOLIC PANEL WITH GFR
Anion gap: 13 (ref 5–15)
BUN: 88 mg/dL — ABNORMAL HIGH (ref 6–20)
CO2: 26 mmol/L (ref 22–32)
Calcium: 9.2 mg/dL (ref 8.9–10.3)
Chloride: 94 mmol/L — ABNORMAL LOW (ref 98–111)
Creatinine, Ser: 2.44 mg/dL — ABNORMAL HIGH (ref 0.61–1.24)
GFR, Estimated: 32 mL/min — ABNORMAL LOW (ref >60)
Glucose, Bld: 205 mg/dL — ABNORMAL HIGH (ref 70–99)
Potassium: 3.5 mmol/L (ref 3.5–5.1)
Sodium: 133 mmol/L — ABNORMAL LOW (ref 135–145)

## 2024-04-27 LAB — BRAIN NATRIURETIC PEPTIDE: B Natriuretic Peptide: 350.9 pg/mL — ABNORMAL HIGH (ref 0.0–100.0)

## 2024-04-27 NOTE — Progress Notes (Signed)
 ReDS Vest / Clip - 04/27/24 0800       ReDS Vest / Clip   Station Marker D    Ruler Value 40    ReDS Value Range High volume overload    ReDS Actual Value 50

## 2024-04-27 NOTE — Telephone Encounter (Signed)
 Spoke to pt about lab work and the need to check blood work again in 1 week. Pt is agreeable. No further questions at this time. Orders placed.

## 2024-04-27 NOTE — Progress Notes (Signed)
 Cardiac Individual Treatment Plan  Patient Details  Name: Oscar [name] MRN: [id number] Date of Birth: Feb 19, 1976 Referring Provider:   Flowsheet Row Cardiac Rehab from 01/26/2024 in [facility] Cardiac and Pulmonary Rehab  Referring Provider Florencio Kava, MD    Initial Encounter Date:  Flowsheet Row Cardiac Rehab from 01/26/2024 in [facility] Cardiac and Pulmonary Rehab  Date 01/26/24    Visit Diagnosis: Chronic systolic heart failure (HCC)  Patient's Home Medications on Admission:  Current Outpatient Medications:    albuterol  (VENTOLIN  HFA) 108 (90 Base) MCG/ACT inhaler, Inhale 2 puffs into the lungs every 6 (six) hours as needed for wheezing or shortness of breath., Disp: 8 g, Rfl: 2   allopurinol  (ZYLOPRIM ) 100 MG tablet, Take 100 mg by mouth daily., Disp: , Rfl:    amiodarone  (PACERONE ) 200 MG tablet, Take 1 tablet (200 mg total) by mouth daily., Disp: , Rfl:    atorvastatin  (LIPITOR ) 80 MG tablet, Take 1 tablet (80 mg total) by mouth at bedtime., Disp: 90 tablet, Rfl: 3   colchicine  0.6 MG tablet, Take 0.6 mg by mouth daily., Disp: , Rfl:    doxycycline  (ADOXA) 100 MG tablet, Take 100 mg by mouth 2 (two) times daily., Disp: , Rfl:    Fluticasone -Umeclidin-Vilant (TRELEGY ELLIPTA ) 100-62.5-25 MCG/ACT AEPB, Inhale 1 puff into the lungs daily., Disp: 60 each, Rfl: 11   insulin  glargine (LANTUS ) 100 UNIT/ML injection, Inject 15 Units into the skin daily., Disp: , Rfl:    metolazone  (ZAROXOLYN ) 2.5 MG tablet, Take 1 tablet (2.5 mg total) by mouth every other day., Disp: 3 tablet, Rfl: 0   metoprolol  succinate (TOPROL -XL) 25 MG 24 hr tablet, Take 1 tablet (25 mg total) by mouth daily. Take with or immediately following a meal., Disp: 30 tablet, Rfl: 11   midodrine  (PROAMATINE ) 5 MG tablet, Take 1 tablet (5 mg total) by mouth 2 (two) times daily with a meal. Stop if SBP is >120, Disp: , Rfl:    NON FORMULARY, Pt uses a cpap nightly, Disp: , Rfl:    pantoprazole  (PROTONIX ) 40 MG tablet, Take 1  tablet (40 mg total) by mouth daily., Disp: , Rfl:    potassium chloride  SA (KLOR-CON  M) 20 MEQ tablet, Take 2 tablets (40 mEq total) by mouth as directed. Take 2 tabs with every dose of metolazone , Disp: 15 tablet, Rfl: 0   sacubitril -valsartan  (ENTRESTO ) 24-26 MG, Take 1 tablet by mouth 2 (two) times daily. (Patient not taking: Reported on 04/27/2024), Disp: 60 tablet, Rfl: 3   torsemide  (DEMADEX ) 20 MG tablet, Take 40 mg by mouth 2 (two) times daily., Disp: , Rfl:   Past Medical History: Past Medical History:  Diagnosis Date   AICD (automatic cardioverter/defibrillator) present    Aortic atherosclerosis (HCC)    Asthma    Chronic respiratory failure (HCC)    CKD (chronic kidney disease), stage III (HCC)    COPD (chronic obstructive pulmonary disease) (HCC)    Deafness in right ear    Diabetes mellitus without complication (HCC)    Dyspnea    GERD (gastroesophageal reflux disease)    Gout    HFrEF (heart failure with reduced ejection fraction) (HCC)    a.) TTE 12/10/14: EF 25%; diff inf HK; sev LV and mod LA dil; LVH. b.) TTE 06/23/18: EF 20-25%; LVH; mild LV dil, mild BAE. c.) TTE 03/01/19: EF 15%, LVH, BAE; triv PR/TR. d.) TTE 02/27/20: EF < 20%; sev LV dil; mild MR. e.) TTE 11/23/20: EF < 20%; glob HK; sev LV  dil; LVH; mild-mod BAE; mod-sev TR, triv AR; G1DD. f.) TTE 05/14/21: EF <15%; LVH; sev LA and mild RV enlar; triv AR/PR, mild TR, mod MR.   Hiatal hernia    History of cardiac catheterization    a.) R/LHC 12/14/2009: normal coronaries. b.) R/LHC 12/11/2014: normal coronaries.   Hyperlipidemia    Hypertension    Hypoxemia    LBBB (left bundle branch block)    NICM (nonischemic cardiomyopathy; dilated cardiomyopathy) (HCC)    a.) R/LHC 12/14/2009: normal cors; LVEDP 18 mmHg, mean PA 29 mmHg, mean PCWP 31 mmHg; CO 6 L/min; CI 2.54 L/min/m. b.) TTE 12/10/2014: EF 25%. c.) R/LHC 12/11/2014: mean RA 9 mmHg, mean PA 22 mmHg, mean PCWP 22 mmHg. d.) TTE 06/23/2018: EF 20-25%. e.) TTE  03/01/2019: EF 15%. f.) TTE 02/27/2020: < 20%. g.) TTE 11/23/2020: EF <20%. h.) TTE 05/14/2021: < 15%.   NSTEMI (non-ST elevated myocardial infarction) (HCC)    a.) x 3 per patient report ---> 2012, 2014, 2016   Obesity    On supplemental oxygen by nasal cannula    a.) 2-3 L/[place] PRN   OSA on CPAP    PAF (paroxysmal atrial fibrillation) (HCC)    a.) CHA2DS2-VASc = 4 (HFrEF, HTN, prior MI, T2DM). b.) rate/rhythm maintained without pharmacologial intervention; no current anticoagulation.   Pancreatitis    PSVT (paroxysmal supraventricular tachycardia) (HCC)     Tobacco Use: Social History   Tobacco Use  Smoking Status Former   Current packs/day: 0.00   Average packs/day: 1.5 packs/day for 34.0 years (51.0 ttl pk-yrs)   Types: Cigarettes   Quit date: 05/2023   Years since quitting: 0.9   Passive exposure: Past  Smokeless Tobacco Never  Tobacco Comments   Quit smoking 05/2023    Labs: Review Flowsheet  More data exists      Latest Ref Rng & Units 06/13/2023 12/03/2023 12/04/2023 02/04/2024 02/24/2024  Labs for ITP Cardiac and Pulmonary Rehab  Cholestrol 0 - 200 mg/dL - - - 743  787   LDL (calc) 0 - 99 mg/dL - - - 805  842   HDL-C >40 mg/dL - - - 41  38   Trlycerides <150 mg/dL - - - 896  87   Hemoglobin A1c 4.8 - 5.6 % 7.3  8.5  - - -  Bicarbonate 20.0 - 28.0 mmol/L - - 29.2  - -  O2 Saturation % - - 89.3  - -     Exercise Target Goals: Exercise Program Goal: Individual exercise prescription set using results from initial 6 min walk test and THRR while considering  patient's activity barriers and safety.   Exercise Prescription Goal: Initial exercise prescription builds to 30-45 minutes a day of aerobic activity, 2-3 days per week.  Home exercise guidelines will be given to patient during program as part of exercise prescription that the participant will acknowledge.   Education: Aerobic Exercise: - Group verbal and visual presentation on the components of exercise  prescription. Introduces F.I.T.T principle from ACSM for exercise prescriptions.  Reviews F.I.T.T. principles of aerobic exercise including progression. Written material provided at class time. Flowsheet Row Cardiac Rehab from 04/06/2024 in [facility] Cardiac and Pulmonary Rehab  Education need identified 01/26/24  Date 04/06/24  Educator nt  Instruction Review Code 1- [employer] Understanding    Education: Resistance Exercise: - Group verbal and visual presentation on the components of exercise prescription. Introduces F.I.T.T principle from ACSM for exercise prescriptions  Reviews F.I.T.T. principles of resistance exercise including progression. Written material  provided at class time. Flowsheet Row Cardiac Rehab from 04/06/2024 in [facility] Cardiac and Pulmonary Rehab  Education need identified 01/26/24     Education: Exercise & Equipment Safety: - Individual verbal instruction and demonstration of equipment use and safety with use of the equipment. Flowsheet Row Cardiac Rehab from 04/06/2024 in [facility] Cardiac and Pulmonary Rehab  Date 01/26/24  Educator MB  Instruction Review Code 1- Verbalizes Understanding    Education: Exercise Physiology & General Exercise Guidelines: - Group verbal and written instruction with models to review the exercise physiology of the cardiovascular system and associated critical values. Provides general exercise guidelines with specific guidelines to those with heart or lung disease. Written material provided at class time. Flowsheet Row Cardiac Rehab from 04/06/2024 in [facility] Cardiac and Pulmonary Rehab  Education need identified 01/26/24    Education: Flexibility, Balance, Mind/Body Relaxation: - Group verbal and visual presentation with interactive activity on the components of exercise prescription. Introduces F.I.T.T principle from ACSM for exercise prescriptions. Reviews F.I.T.T. principles of flexibility and balance exercise training including progression. Also discusses  the mind body connection.  Reviews various relaxation techniques to help reduce and manage stress (i.e. Deep breathing, progressive muscle relaxation, and visualization). Balance handout provided to take home. Written material provided at class time.   Activity Barriers & Risk Stratification:  Activity Barriers & Cardiac Risk Stratification - 01/26/24 1028       Activity Barriers & Cardiac Risk Stratification   Activity Barriers Joint Problems;Other (comment)    Comments Elbow joint, gout in both feet    Cardiac Risk Stratification High          6 Minute Walk:  6 Minute Walk     Row Name 01/26/24 1027         6 Minute Walk   Phase Initial     Distance 800 feet     Walk Time 6 minutes     # of Rest Breaks 0     MPH 1.52     METS 2.06     RPE 11     Perceived Dyspnea  1     VO2 Peak 7.21     Symptoms No     Resting HR 60 bpm     Resting BP 104/70     Resting Oxygen Saturation  98 %     Exercise Oxygen Saturation  during 6 min walk 97 %     Max Ex. HR 100 bpm     Max Ex. BP 118/80     2 Minute Post BP 100/70        Oxygen Initial Assessment:  Oxygen Initial Assessment - 01/20/24 1430       Home Oxygen   Home Oxygen Device Home Concentrator;E-Tanks    Sleep Oxygen Prescription Continuous;CPAP    Liters per minute 3    Home Exercise Oxygen Prescription Continuous    Liters per minute 3   when winded, 2 l when not winded   Home Resting Oxygen Prescription Continuous    Liters per minute 2    Compliance with Home Oxygen Use Yes      Intervention   Short Term Goals To learn and exhibit compliance with exercise, home and travel O2 prescription;To learn and understand importance of monitoring SPO2 with pulse oximeter and demonstrate accurate use of the pulse oximeter.;To learn and understand importance of maintaining oxygen saturations>88%;To learn and demonstrate proper pursed lip breathing techniques or other breathing techniques. ;To learn and demonstrate proper  use  of respiratory medications    Long  Term Goals Exhibits compliance with exercise, home  and travel O2 prescription;Verbalizes importance of monitoring SPO2 with pulse oximeter and return demonstration;Maintenance of O2 saturations>88%;Exhibits proper breathing techniques, such as pursed lip breathing or other method taught during program session;Compliance with respiratory medication;Demonstrates proper use of MDI's          Oxygen Re-Evaluation:  Oxygen Re-Evaluation     Row Name 02/01/24 1014             Home Oxygen   Home Oxygen Device Home Concentrator;E-Tanks       Sleep Oxygen Prescription Continuous;CPAP       Liters per minute 3       Home Exercise Oxygen Prescription Continuous       Liters per minute 3       Home Resting Oxygen Prescription Continuous       Liters per minute 2       Compliance with Home Oxygen Use Yes         Goals/Expected Outcomes   Short Term Goals To learn and exhibit compliance with exercise, home and travel O2 prescription;To learn and understand importance of monitoring SPO2 with pulse oximeter and demonstrate accurate use of the pulse oximeter.;To learn and understand importance of maintaining oxygen saturations>88%;To learn and demonstrate proper pursed lip breathing techniques or other breathing techniques. ;To learn and demonstrate proper use of respiratory medications       Long  Term Goals Exhibits compliance with exercise, home  and travel O2 prescription;Verbalizes importance of monitoring SPO2 with pulse oximeter and return demonstration;Maintenance of O2 saturations>88%;Exhibits proper breathing techniques, such as pursed lip breathing or other method taught during program session;Compliance with respiratory medication;Demonstrates proper use of MDI's       Comments Reviewed RPE and dyspnea scale, THR and program prescription with pt today.  Pt voiced understanding and was given a copy of goals to take home.       Goals/Expected Outcomes  Short: Use RPE daily to regulate intensity. Long: Follow program prescription in THR.          Oxygen Discharge (Final Oxygen Re-Evaluation):  Oxygen Re-Evaluation - 02/01/24 1014       Home Oxygen   Home Oxygen Device Home Concentrator;E-Tanks    Sleep Oxygen Prescription Continuous;CPAP    Liters per minute 3    Home Exercise Oxygen Prescription Continuous    Liters per minute 3    Home Resting Oxygen Prescription Continuous    Liters per minute 2    Compliance with Home Oxygen Use Yes      Goals/Expected Outcomes   Short Term Goals To learn and exhibit compliance with exercise, home and travel O2 prescription;To learn and understand importance of monitoring SPO2 with pulse oximeter and demonstrate accurate use of the pulse oximeter.;To learn and understand importance of maintaining oxygen saturations>88%;To learn and demonstrate proper pursed lip breathing techniques or other breathing techniques. ;To learn and demonstrate proper use of respiratory medications    Long  Term Goals Exhibits compliance with exercise, home  and travel O2 prescription;Verbalizes importance of monitoring SPO2 with pulse oximeter and return demonstration;Maintenance of O2 saturations>88%;Exhibits proper breathing techniques, such as pursed lip breathing or other method taught during program session;Compliance with respiratory medication;Demonstrates proper use of MDI's    Comments Reviewed RPE and dyspnea scale, THR and program prescription with pt today.  Pt voiced understanding and was given a copy of goals to take home.  Goals/Expected Outcomes Short: Use RPE daily to regulate intensity. Long: Follow program prescription in THR.          Initial Exercise Prescription:  Initial Exercise Prescription - 01/26/24 1000       Date of Initial Exercise RX and Referring Provider   Date 01/26/24    Referring Provider Florencio Kava, MD      Oxygen   Oxygen Continuous    Liters 2-3L    Maintain Oxygen  Saturation 88% or higher      Treadmill   MPH 1.4    Grade 0    Minutes 15    METs 2.07      Recumbant Bike   Level 2    RPM 50    Watts 25    Minutes 15    METs 2.06      NuStep   Level 2    SPM 80    Minutes 15    METs 2.06      T5 Nustep   Level 2    SPM 80    Minutes 15    METs 2.06      Biostep-RELP   Level 2    SPM 50    Minutes 15    METs 2.06      Prescription Details   Frequency (times per week) 2    Duration Progress to 30 minutes of continuous aerobic without signs/symptoms of physical distress      Intensity   THRR 40-80% of Max Heartrate 104-149    Ratings of Perceived Exertion 11-13    Perceived Dyspnea 0-4      Progression   Progression Continue to progress workloads to maintain intensity without signs/symptoms of physical distress.      Resistance Training   Training Prescription Yes    Weight 3 lb, 5 lb   3 lb (R), 5 lb (L)   Reps 10-15          Perform Capillary Blood Glucose checks as needed.  Exercise Prescription Changes:   Exercise Prescription Changes     Row Name 01/26/24 1000 02/18/24 1600 03/15/24 1100 03/29/24 0800 04/06/24 0900     Response to Exercise   Blood Pressure (Admit) 104/70 108/72 136/70 100/62 --   Blood Pressure (Exercise) 118/80 128/80 118/70 -- --   Blood Pressure (Exit) 100/70 118/74 118/62 90/68 --   Heart Rate (Admit) 60 bpm 81 bpm 70 bpm 55 bpm --   Heart Rate (Exercise) 100 bpm 105 bpm 107 bpm 94 bpm --   Heart Rate (Exit) 60 bpm 75 bpm 86 bpm 81 bpm --   Oxygen Saturation (Admit) 98 % -- -- -- --   Oxygen Saturation (Exercise) 97 % -- -- -- --   Oxygen Saturation (Exit) 97 % -- -- -- --   Rating of Perceived Exertion (Exercise) 11 11 14 12  --   Perceived Dyspnea (Exercise) 1 1 0 -- --   Symptoms none none none none --   Comments results first 2 weeks of exercise -- -- --   Duration -- Progress to 30 minutes of  aerobic without signs/symptoms of physical distress Progress to 30 minutes of   aerobic without signs/symptoms of physical distress Progress to 30 minutes of  aerobic without signs/symptoms of physical distress --   Intensity THRR New THRR unchanged THRR unchanged THRR unchanged --     Progression   Progression -- Continue to progress workloads to maintain intensity without signs/symptoms of physical distress. Continue  to progress workloads to maintain intensity without signs/symptoms of physical distress. Continue to progress workloads to maintain intensity without signs/symptoms of physical distress. --   Average METs 2.06 5.46 1.97 2 --     Resistance Training   Training Prescription -- Yes Yes Yes --   Weight -- 3 lb, 5 lb  3 lb (R), 5 lb (L) 3 lb, 5 lb  3 lb (R), 5 lb (L) 0  Did not complete any resistance training since last review --   Reps -- 10-15 10-15 10-15 --     Interval Training   Interval Training -- No No No --     Oxygen   Oxygen -- Continuous Continuous Continuous --   Liters -- 3L 3L 3L --     Treadmill   MPH -- 1 1.3 1.3 --   Grade -- 0 0 0 --   Minutes -- 15 15 15  --   METs -- 1.77 1.99 1.99 --     Recumbant Bike   Level -- -- 3 -- --   Watts -- -- 25 -- --   Minutes -- -- 15 -- --   METs -- -- 2.56 -- --     T5 Nustep   Level -- 2 3 -- --   Minutes -- 15 15 -- --   METs -- 2 2 -- --     Biostep-RELP   Level -- -- -- 1 --   Minutes -- -- -- 15 --   METs -- -- -- 2 --     Home Exercise Plan   Plans to continue exercise at -- -- -- -- Home (comment)  Treadmill at home, looking into getting dumbbells   Frequency -- -- -- -- Add 3 additional days to program exercise sessions.   Initial Home Exercises Provided -- -- -- -- 04/06/24     Oxygen   Maintain Oxygen Saturation -- 88% or higher 88% or higher 88% or higher --    Row Name 04/14/24 0900             Response to Exercise   Blood Pressure (Admit) 108/62       Blood Pressure (Exit) 118/64       Heart Rate (Admit) 89 bpm       Heart Rate (Exercise) 101 bpm       Heart  Rate (Exit) 84 bpm       Rating of Perceived Exertion (Exercise) 15       Symptoms none       Duration Progress to 30 minutes of  aerobic without signs/symptoms of physical distress       Intensity THRR unchanged         Progression   Progression Continue to progress workloads to maintain intensity without signs/symptoms of physical distress.       Average METs 1.84         Resistance Training   Training Prescription Yes       Weight 0  Did not complete any resistance training since last review       Reps 10-15         Interval Training   Interval Training No         Oxygen   Oxygen Continuous       Liters 3L         Treadmill   MPH 1.1       Grade 0       Minutes 15  METs 1.84         T5 Nustep   Level 1       Minutes 15         Home Exercise Plan   Plans to continue exercise at Home (comment)  Treadmill at home, looking into getting dumbbells       Frequency Add 3 additional days to program exercise sessions.       Initial Home Exercises Provided 04/06/24         Oxygen   Maintain Oxygen Saturation 88% or higher          Exercise Comments:   Exercise Comments     Row Name 02/01/24 1012 03/21/24 0944         Exercise Comments First full day of exercise!  Patient was oriented to gym and equipment including functions, settings, policies, and procedures.  Patient's individual exercise prescription and treatment plan were reviewed.  All starting workloads were established based on the results of the 6 minute walk test done at initial orientation visit.  The plan for exercise progression was also introduced and progression will be customized based on patient's performance and goals. Pt able to follow exercise prescription today. Pt stated he wasn't feeling well due to extra fluid he has and is leaving  a little early to go home and take his fluid pill.  Will continue to monitor for progression.         Exercise Goals and Review:   Exercise Goals     Row Name  01/26/24 1036             Exercise Goals   Increase Physical Activity Yes       Intervention Provide advice, education, support and counseling about physical activity/exercise needs.;Develop an individualized exercise prescription for aerobic and resistive training based on initial evaluation findings, risk stratification, comorbidities and participant's personal goals.       Expected Outcomes Short Term: Attend rehab on a regular basis to increase amount of physical activity.;Long Term: Add in home exercise to make exercise part of routine and to increase amount of physical activity.;Long Term: Exercising regularly at least 3-5 days a week.       Increase Strength and Stamina Yes       Intervention Provide advice, education, support and counseling about physical activity/exercise needs.;Develop an individualized exercise prescription for aerobic and resistive training based on initial evaluation findings, risk stratification, comorbidities and participant's personal goals.       Expected Outcomes Short Term: Increase workloads from initial exercise prescription for resistance, speed, and METs.;Short Term: Perform resistance training exercises routinely during rehab and add in resistance training at home;Long Term: Improve cardiorespiratory fitness, muscular endurance and strength as measured by increased METs and functional capacity ( )       Able to understand and use rate of perceived exertion (RPE) scale Yes       Intervention Provide education and explanation on how to use RPE scale       Expected Outcomes Short Term: Able to use RPE daily in rehab to express subjective intensity level;Long Term:  Able to use RPE to guide intensity level when exercising independently       Able to understand and use Dyspnea scale Yes       Intervention Provide education and explanation on how to use Dyspnea scale       Expected Outcomes Short Term: Able to use Dyspnea scale daily in rehab to express  subjective sense of  shortness of breath during exertion;Long Term: Able to use Dyspnea scale to guide intensity level when exercising independently       Knowledge and understanding of Target Heart Rate Range (THRR) Yes       Intervention Provide education and explanation of THRR including how the numbers were predicted and where they are located for reference       Expected Outcomes Short Term: Able to state/look up THRR;Short Term: Able to use daily as guideline for intensity in rehab;Long Term: Able to use THRR to govern intensity when exercising independently       Able to check pulse independently Yes       Intervention Provide education and demonstration on how to check pulse in carotid and radial arteries.;Review the importance of being able to check your own pulse for safety during independent exercise       Expected Outcomes Short Term: Able to explain why pulse checking is important during independent exercise;Long Term: Able to check pulse independently and accurately       Understanding of Exercise Prescription Yes       Intervention Provide education, explanation, and written materials on patient's individual exercise prescription       Expected Outcomes Short Term: Able to explain program exercise prescription;Long Term: Able to explain home exercise prescription to exercise independently          Exercise Goals Re-Evaluation :  Exercise Goals Re-Evaluation     Row Name 02/18/24 1629 03/01/24 1337 03/15/24 1103 03/29/24 0830 04/06/24 0947     Exercise Goal Re-Evaluation   Exercise Goals Review Increase Physical Activity;Increase Strength and Stamina;Understanding of Exercise Prescription Increase Physical Activity;Increase Strength and Stamina;Understanding of Exercise Prescription Increase Physical Activity;Increase Strength and Stamina;Understanding of Exercise Prescription Increase Physical Activity;Increase Strength and Stamina;Understanding of Exercise Prescription Increase  Physical Activity;Increase Strength and Stamina;Understanding of Exercise Prescription;Able to understand and use Dyspnea scale;Knowledge and understanding of Target Heart Rate Range (THRR);Able to understand and use rate of perceived exertion (RPE) scale;Able to check pulse independently   Comments Anton is off to a good start in the program. He was able to attend his first 2 sessions during this review period. During these sessions he was able to use the treadmill at 1 mph and no incline, and the T5 nustep at level 2. We will continue to monitor his progress in the program. Nels has not attended rehab since the last review. We will contact him to see when he plans to return to regular attendance in the program. We will continue to monitor his progress when he returns to rehab. Kyllian is doing well in rehab. He has been able to increase his speed on the treadmill from 1 to 1.39mph. He has also been able to increase to level 3 on both the T5 nustep and recumbent bike. We will continue to monitor his progress in the program. [place] has only attended one session since the last review. He was able to walk the treadmill at a speed of 1.3 mph with no incline. He also worked at level 1 on the biostep, but did not do any resistance training due to him not feeling well and leaving early. We will continue to monitor his progress in the program. Reviewed home exercise with pt today.  Pt plans to use his treadmill at home for aerobic exercise and looking into getting dumbbells for resistance for exercise. He plans to add 3-5 additional days of exercise at home. Reviewed THR, pulse, RPE, sign and symptoms,  pulse oximetery and when to call 911 or MD.  Also discussed weather considerations and indoor options.  Pt voiced understanding.   Expected Outcomes Short: Continue to follow exercise prescription. Long: Continue exercise to improve strength and stamina. Short: Return to rehab when appropriate. Long: Graduate from the  program. Short: Continue to increase treadmill workload. Long: Continue exercise to improve strength and stamina. Short: Attend rehab more consistently. Long: Continue exercise to improve strength and stamina. Short: Add 3 additional days of exercise at home. Long: Continue to exercise at home independently.    Row Name 04/14/24 0945             Exercise Goal Re-Evaluation   Exercise Goals Review Increase Physical Activity;Increase Strength and Stamina;Understanding of Exercise Prescription       Comments Yee has only attended one session since the last review. During his one session his treadmill speed decreased from 1.3 mph to 1.1 mph with no incline. He also decreased to level 1 on the T5 nustep. We will encourage him to attend rehab more consistently and will continue to monitor his progress in the program.          Discharge Exercise Prescription (Final Exercise Prescription Changes):  Exercise Prescription Changes - 04/14/24 0900       Response to Exercise   Blood Pressure (Admit) 108/62    Blood Pressure (Exit) 118/64    Heart Rate (Admit) 89 bpm    Heart Rate (Exercise) 101 bpm    Heart Rate (Exit) 84 bpm    Rating of Perceived Exertion (Exercise) 15    Symptoms none    Duration Progress to 30 minutes of  aerobic without signs/symptoms of physical distress    Intensity THRR unchanged      Progression   Progression Continue to progress workloads to maintain intensity without signs/symptoms of physical distress.    Average METs 1.84      Resistance Training   Training Prescription Yes    Weight 0   Did not complete any resistance training since last review   Reps 10-15      Interval Training   Interval Training No      Oxygen   Oxygen Continuous    Liters 3L      Treadmill   MPH 1.1    Grade 0    Minutes 15    METs 1.84      T5 Nustep   Level 1    Minutes 15      Home Exercise Plan   Plans to continue exercise at Home (comment)   Treadmill at home,  looking into getting dumbbells   Frequency Add 3 additional days to program exercise sessions.    Initial Home Exercises Provided 04/06/24      Oxygen   Maintain Oxygen Saturation 88% or higher          Nutrition:  Target Goals: Understanding of nutrition guidelines, daily intake of sodium 1500mg , cholesterol 200mg , calories 30% from fat and 7% or less from saturated fats, daily to have 5 or more servings of fruits and vegetables.  Education: Nutrition 1 -Group instruction provided by verbal, written material, interactive activities, discussions, models, and posters to present general guidelines for heart healthy nutrition including macronutrients, label reading, and promoting whole foods over processed counterparts. Education serves as Pensions consultant of discussion of heart healthy eating for all. Written material provided at class time.    Education: Nutrition 2 -Group instruction provided by verbal, written material, interactive activities,  discussions, models, and posters to present general guidelines for heart healthy nutrition including sodium, cholesterol, and saturated fat. Providing guidance of habit forming to improve blood pressure, cholesterol, and body weight. Written material provided at class time.     Biometrics:  Pre Biometrics - 01/26/24 1036       Pre Biometrics   Height 5' 9.1 (1.755 m)    Weight 308 lb 4.8 oz (139.8 kg)    Waist Circumference 58 inches    Hip Circumference 55 inches    Waist to Hip Ratio 1.05 %    BMI (Calculated) 45.4    Single Leg Stand 7.9 seconds           Nutrition Therapy Plan and Nutrition Goals:  Nutrition Therapy & Goals - 02/01/24 1340       Nutrition Therapy   Diet Carb controlled, Cardiac, Low Na    Protein (specify units) 90g    Fiber 30 grams    Whole Grain Foods 3 servings    Saturated Fats 15 max. grams    Fruits and Vegetables 5 servings/day    Sodium 2 grams      Personal Nutrition Goals   Nutrition Goal  Cut out sugary beverages, drink 32-48oz water daily    Personal Goal #2 Eat 15-30gProtein and 30-60gCarbs at each meal.    Personal Goal #3 Read labels and reduce sodium intake to below 2300mg . Ideally 1500mg  per day.    Comments Patient drinking mostly sugary beverages like juice and soda. Says he has tried to move away from soda, reports he was drinking 4-6L of soda a day. Explained to him the negative impact sugary beverages have on his health, blood sugar and weight. Set goal to switch over and drink more water, with short term goal of ~32-48oz of water daily with 64-80oz as a long term goal. Educated on carb portions of ~30-60g per meal, the importance of eating throughout the day and choosing high quality carbs rather than junk food and sugary beverages. Provided him with Mediterranean diet handout. Educated on types of fats, sources, and how to read labels. He reports he adds salt to his foods, has been told to limit and look for better alternatives like misses dash. Confirmed and encouraged him to not salt food. Reinforced focus on keeping carb portions controlled at meals and limiting sodium.      Intervention Plan   Intervention Prescribe, educate and counsel regarding individualized specific dietary modifications aiming towards targeted core components such as weight, hypertension, lipid management, diabetes, heart failure and other comorbidities.;Nutrition handout(s) given to patient.    Expected Outcomes Short Term Goal: Understand basic principles of dietary content, such as calories, fat, sodium, cholesterol and nutrients.;Short Term Goal: A plan has been developed with personal nutrition goals set during dietitian appointment.;Long Term Goal: Adherence to prescribed nutrition plan.          Nutrition Assessments:  MEDIFICTS Score Key: >=70 Need to make dietary changes  40-70 Heart Healthy Diet <= 40 Therapeutic Level Cholesterol Diet  Flowsheet Row Cardiac Rehab from 02/01/2024 in  [facility]  Picture Your Plate Total Score on Admission 60   Picture Your Plate Scores: <59 Unhealthy dietary pattern with much room for improvement. 41-50 Dietary pattern unlikely to meet recommendations for good health and room for improvement. 51-60 More healthful dietary pattern, with some room for improvement.  >60 Healthy dietary pattern, although there may be some specific behaviors that could be improved.  Nutrition Goals Re-Evaluation:   Nutrition Goals Discharge (Final Nutrition Goals Re-Evaluation):   Psychosocial: Target Goals: Acknowledge presence or absence of significant depression and/or stress, maximize coping skills, provide positive support system. Participant is able to verbalize types and ability to use techniques and skills needed for reducing stress and depression.   Education: Stress, Anxiety, and Depression - Group verbal and visual presentation to define topics covered.  Reviews how body is impacted by stress, anxiety, and depression.  Also discusses healthy ways to reduce stress and to treat/manage anxiety and depression. Written material provided at class time.   Education: Sleep Hygiene -Provides group verbal and written instruction about how sleep can affect your health.  Define sleep hygiene, discuss sleep cycles and impact of sleep habits. Review good sleep hygiene tips.   Initial Review & Psychosocial Screening:  Initial Psych Review & Screening - 01/20/24 1432       Initial Review   Current issues with None Identified      Family Dynamics   Good Support System? Yes   siblings,     Barriers   Psychosocial barriers to participate in program There are no identifiable barriers or psychosocial needs.      Screening Interventions   Interventions Encouraged to exercise;To provide support and resources with identified psychosocial needs;Provide feedback about the scores to participant    Expected Outcomes Short Term goal:  Utilizing psychosocial counselor, staff and physician to assist with identification of specific Stressors or current issues interfering with healing process. Setting desired goal for each stressor or current issue identified.;Long Term Goal: Stressors or current issues are controlled or eliminated.;Short Term goal: Identification and review with participant of any Quality of Life or Depression concerns found by scoring the questionnaire.;Long Term goal: The participant improves quality of Life and PHQ9 Scores as seen by post scores and/or verbalization of changes          Quality of Life Scores:   Quality of Life - 02/01/24 1704       Quality of Life   Select Quality of Life      Quality of Life Scores   Health/Function Pre 18.32 %    Socioeconomic Pre 27.5 %    Psych/Spiritual Pre 30 %    Family Pre 30 %    GLOBAL Pre 23.2 %         Scores of 19 and below usually indicate a poorer quality of life in these areas.  A difference of  2-3 points is a clinically meaningful difference.  A difference of 2-3 points in the total score of the Quality of Life Index has been associated with significant improvement in overall quality of life, self-image, physical symptoms, and general health in studies assessing change in quality of life.  PHQ-9: Review Flowsheet  More data exists      01/26/2024 10/17/2021 06/15/2018 05/18/2018 03/05/2018  Depression screen PHQ 2/9  Decreased Interest 1 0 0 0 0  Down, Depressed, Hopeless 0 0 0 0 1  PHQ - 2 Score 1 0 0 0 1  Altered sleeping 3 - - - -  Tired, decreased energy 1 - - - -  Change in appetite 1 - - - -  Feeling bad or failure about yourself  0 - - - -  Trouble concentrating 0 - - - -  Moving slowly or fidgety/restless 1 - - - -  Suicidal thoughts 0 - - - -  PHQ-9 Score 7 - - - -  Difficult doing  work/chores Somewhat difficult - - - -   Interpretation of Total Score  Total Score Depression Severity:  1-4 = Minimal depression, 5-9 = Mild  depression, 10-14 = Moderate depression, 15-19 = Moderately severe depression, 20-27 = Severe depression   Psychosocial Evaluation and Intervention:  Psychosocial Evaluation - 01/20/24 1447       Psychosocial Evaluation & Interventions   Comments Dayveon has no barriers to attending the program. He lives alone. He is ready to start the program to work on exercise and learning more about amanging his heart failure.    Expected Outcomes STG attend all scheduled sessions, work on exercise progression as tolerated. Attend education sessions LTG COntinues with exercise progression, utilizes nutrition and education information to continue managing his health    Continue Psychosocial Services  Follow up required by staff          Psychosocial Re-Evaluation:   Psychosocial Discharge (Final Psychosocial Re-Evaluation):   Vocational Rehabilitation: Provide vocational rehab assistance to qualifying candidates.   Vocational Rehab Evaluation & Intervention:   Education: Education Goals: Education classes will be provided on a variety of topics geared toward better understanding of heart health and risk factor modification. Participant will state understanding/return demonstration of topics presented as noted by education test scores.  Learning Barriers/Preferences:   General Cardiac Education Topics:  AED/CPR: - Group verbal and written instruction with the use of models to demonstrate the basic use of the AED with the basic ABC's of resuscitation.   Test and Procedures: - Group verbal and visual presentation and models provide information about basic cardiac anatomy and function. Reviews the testing methods done to diagnose heart disease and the outcomes of the test results. Describes the treatment choices: Medical Management, Angioplasty, or Coronary Bypass Surgery for treating various heart conditions including Myocardial Infarction, Angina, Valve Disease, and Cardiac Arrhythmias. Written  material provided at class time. Flowsheet Row Cardiac Rehab from 04/06/2024 in [facility] Card[facility] Rehab  Education need identified 01/26/24    Medication Safety: - Group verbal and visual instruction to review commonly prescribed medications for heart and lung disease. Reviews the medication, class of the drug, and side effects. Includes the steps to properly store meds and maintain the prescription regimen. Written material provided at class time.   Intimacy: - Group verbal instruction through game format to discuss how heart and lung disease can affect sexual intimacy. Written material provided at class time. Flowsheet Row Cardiac Rehab from 04/06/2024 in [facility] Cardiac and Pulmonary Rehab  Date 04/06/24  Educator nt  Instruction Review Code 1- TEFL teacher Understanding    Know Your Numbers and Heart Failure: - Group verbal and visual instruction to discuss disease risk factors for cardiac and pulmonary disease and treatment options.  Reviews associated critical values for Overweight/Obesity, Hypertension, Cholesterol, and Diabetes.  Discusses basics of heart failure: signs/symptoms and treatments.  Introduces Heart Failure Zone chart for action plan for heart failure. Written material provided at class time. Flowsheet Row Cardiac Rehab from 04/06/2024 in [facility] Cardiac and Pulmonary Rehab  Education need identified 01/26/24  Date 03/02/24  Educator sb  Instruction Review Code 1- Verbalizes Understanding    Infection Prevention: - Provides verbal and written material to individual with discussion of infection control including proper hand washing and proper equipment cleaning during exercise session. Flowsheet Row Cardiac Rehab from 04/06/2024 in [facility] Cardiac and Pulmonary Rehab  Date 01/26/24  Educator MB  Instruction Review Code 1- Verbalizes Understanding    Falls Prevention: - Provides verbal and  written material to individual with discussion of falls prevention and  safety. Flowsheet Row Cardiac Rehab from 04/06/2024 in [facility] Cardiac and Pulmonary Rehab  Date 01/26/24  Educator MB  Instruction Review Code 1- Verbalizes Understanding    Other: -Provides group and verbal instruction on various topics (see comments)   Knowledge Questionnaire Score:  Knowledge Questionnaire Score - 01/26/24 1038       Knowledge Questionnaire Score   Pre Score 20/26          Core Components/Risk Factors/Patient Goals at Admission:  Personal Goals and Risk Factors at Admission - 01/26/24 1038       Core Components/Risk Factors/Patient Goals on Admission    Weight Management Yes;Obesity;Weight Loss    Intervention Weight Management: Develop a combined nutrition and exercise program designed to reach desired caloric intake, while maintaining appropriate intake of nutrient and fiber, sodium and fats, and appropriate energy expenditure required for the weight goal.;Weight Management: Provide education and appropriate resources to help participant work on and attain dietary goals.;Weight Management/Obesity: Establish reasonable short term and long term weight goals.;Obesity: Provide education and appropriate resources to help participant work on and attain dietary goals.    Admit Weight 308 lb 4.8 oz (139.8 kg)    Goal Weight: Short Term 274 lb (124.3 kg)    Goal Weight: Long Term 240 lb (108.9 kg)    Expected Outcomes Short Term: Continue to assess and modify interventions until short term weight is achieved;Long Term: Adherence to nutrition and physical activity/exercise program aimed toward attainment of established weight goal;Weight Loss: Understanding of general recommendations for a balanced deficit meal plan, which promotes 1-2 lb weight loss per week and includes a negative energy balance of [phone] kcal/d;Understanding recommendations for meals to include 15-35% energy as protein, 25-35% energy from fat, 35-60% energy from carbohydrates, less than 200mg  of dietary  cholesterol, 20-35 gm of total fiber daily;Understanding of distribution of calorie intake throughout the day with the consumption of 4-5 meals/snacks    Diabetes Yes    Intervention Provide education about signs/symptoms and action to take for hypo/hyperglycemia.;Provide education about proper nutrition, including hydration, and aerobic/resistive exercise prescription along with prescribed medications to achieve blood glucose in normal ranges: Fasting glucose 65-99 mg/dL    Expected Outcomes Short Term: Participant verbalizes understanding of the signs/symptoms and immediate care of hyper/hypoglycemia, proper foot care and importance of medication, aerobic/resistive exercise and nutrition plan for blood glucose control.;Long Term: Attainment of HbA1C < 7%.    Heart Failure Yes    Intervention Provide a combined exercise and nutrition program that is supplemented with education, support and counseling about heart failure. Directed toward relieving symptoms such as shortness of breath, decreased exercise tolerance, and extremity edema.    Expected Outcomes Improve functional capacity of life;Short term: Attendance in program 2-3 days a week with increased exercise capacity. Reported lower sodium intake. Reported increased fruit and vegetable intake. Reports medication compliance.;Short term: Daily weights obtained and reported for increase. Utilizing diuretic protocols set by physician.;Long term: Adoption of self-care skills and reduction of barriers for early signs and symptoms recognition and intervention leading to self-care maintenance.    Hypertension Yes    Intervention Provide education on lifestyle modifcations including regular physical activity/exercise, weight management, moderate sodium restriction and increased consumption of fresh fruit, vegetables, and low fat dairy, alcohol moderation, and smoking cessation.;Monitor prescription use compliance.    Expected Outcomes Short Term: Continued  assessment and intervention until BP is < 140/27mm HG in hypertensive participants. <  130/65mm HG in hypertensive participants with diabetes, heart failure or chronic kidney disease.;Long Term: Maintenance of blood pressure at goal levels.    Lipids Yes    Intervention Provide education and support for participant on nutrition & aerobic/resistive exercise along with prescribed medications to achieve LDL 70mg , HDL >40mg .    Expected Outcomes Short Term: Participant states understanding of desired cholesterol values and is compliant with medications prescribed. Participant is following exercise prescription and nutrition guidelines.;Long Term: Cholesterol controlled with medications as prescribed, with individualized exercise RX and with personalized nutrition plan. Value goals: LDL < 70mg , HDL > 40 mg.          Education:Diabetes - Individual verbal and written instruction to review signs/symptoms of diabetes, desired ranges of glucose level fasting, after meals and with exercise. Acknowledge that pre and post exercise glucose checks will be done for 3 sessions at entry of program.   Core Components/Risk Factors/Patient Goals Review:    Core Components/Risk Factors/Patient Goals at Discharge (Final Review):    ITP Comments:  ITP Comments     Row Name 01/20/24 1443 01/26/24 1027 02/01/24 1012 02/03/24 1041 03/02/24 1036   ITP Comments Virtual orientation call completed today. he has an appointment on Date: 01/26/2024  for EP eval and gym Orientation.  Documentation of diagnosis can be found in [facility] 12/16/2023 . Completed and gym orientation for cardiac rehab. Initial ITP created and sent for review to Dr. Oneil Pinal, Medical Director. First full day of exercise!  Patient was oriented to gym and equipment including functions, settings, policies, and procedures.  Patient's individual exercise prescription and treatment plan were reviewed.  All starting workloads were established based on the  results of the 6 minute walk test done at initial orientation visit.  The plan for exercise progression was also introduced and progression will be customized based on patient's performance and goals. 30 Day review completed. Medical Director ITP review done, changes made as directed, and signed approval by Medical Director.    new to program 30 Day review completed. Medical Director ITP review done, changes made as directed, and signed approval by Medical Director.    Row Name 03/21/24 [phone] 03/30/24 0958 04/04/24 1112 04/18/24 0940 04/27/24 1007   ITP Comments Pt able to follow exercise prescription today. Pt stated he wasn't feeling well due to extra fluid he has and is leaving  a little early to go home and take his fluid pill.  Will continue to monitor for progression. 30 Day review completed. Medical Director ITP review done, changes made as directed, and signed approval by Medical Director. Patient called to inform staff that he would be out for 2 weeks as he works with the [facility] to manage his fluid retention. Patient is still having fluid issues and is following with the heart failure clinic. He wishes to be on hold until Sept 15th. 30 Day review completed. Medical Director ITP review done; changes made as directed and signed approval by Medical Director.      Comments: 30 day review

## 2024-04-27 NOTE — Patient Instructions (Signed)
 Medication Changes:  No medication changes today!  Lab Work:  Go over to the [facility]. Go pass the gift shop and have your blood work completed.  We will only call you if the results are abnormal or if the provider would like to make medication changes.  No news is good news.   Follow-Up in: Please keep follow up appointment with the [facility] in November.   Thank you for choosing [facility].    At the [facility], you and your health needs are our priority. We have a designated team specialized in the treatment of Heart Failure. This Care Team includes your primary Heart Failure Specialized Cardiologist (physician), Advanced Practice Providers (APPs- Physician Assistants and Nurse Practitioners), and Pharmacist who all work together to provide you with the care you need, when you need it.   You may see any of the following providers on your designated Care Team at your next follow up:  Dr. Toribio Fuel Dr. Ezra Shuck Dr. Ria Commander Dr. Morene Brownie Ellouise Class, FNP Jaun Bash, RPH-CPP  Please be sure to bring in all your medications bottles to every appointment.   Need to Contact Us :  If you have any questions or concerns before your next appointment please send us  a message through [facility] or call our office at [phone].    TO LEAVE A MESSAGE FOR THE NURSE SELECT OPTION 2, PLEASE LEAVE A MESSAGE INCLUDING: YOUR NAME DATE OF BIRTH CALL BACK NUMBER REASON FOR CALL**this is important as we prioritize the call backs  YOU WILL RECEIVE A CALL BACK THE SAME DAY AS LONG AS YOU CALL BEFORE 4:00 PM

## 2024-04-28 ENCOUNTER — Ambulatory Visit (INDEPENDENT_AMBULATORY_CARE_PROVIDER_SITE_OTHER): Admitting: Pulmonary Disease

## 2024-04-28 ENCOUNTER — Encounter: Payer: Self-pay | Admitting: Pulmonary Disease

## 2024-04-28 VITALS — BP 120/90 | HR 77 | Temp 98.0°F | Ht 65.0 in | Wt 304.0 lb

## 2024-04-28 DIAGNOSIS — I5022 Chronic systolic (congestive) heart failure: Secondary | ICD-10-CM

## 2024-04-28 DIAGNOSIS — J4489 Other specified chronic obstructive pulmonary disease: Secondary | ICD-10-CM

## 2024-04-28 DIAGNOSIS — Z87891 Personal history of nicotine dependence: Secondary | ICD-10-CM

## 2024-04-28 DIAGNOSIS — G4733 Obstructive sleep apnea (adult) (pediatric): Secondary | ICD-10-CM | POA: Diagnosis not present

## 2024-04-28 DIAGNOSIS — Z6841 Body Mass Index (BMI) 40.0 and over, adult: Secondary | ICD-10-CM

## 2024-04-28 DIAGNOSIS — E66813 Obesity, class 3: Secondary | ICD-10-CM | POA: Diagnosis not present

## 2024-04-28 DIAGNOSIS — N1832 Chronic kidney disease, stage 3b: Secondary | ICD-10-CM

## 2024-04-28 DIAGNOSIS — R0602 Shortness of breath: Secondary | ICD-10-CM

## 2024-04-28 LAB — NITRIC OXIDE: Nitric Oxide: 38

## 2024-04-28 MED ORDER — ALBUTEROL SULFATE HFA 108 (90 BASE) MCG/ACT IN AERS: 2.0000 | INHALATION_SPRAY | Freq: Four times a day (QID) | RESPIRATORY_TRACT | 6 refills | Status: AC | PRN

## 2024-04-28 MED ORDER — TRELEGY ELLIPTA 200-62.5-25 MCG/ACT IN AEPB
1.0000 | INHALATION_SPRAY | Freq: Every day | RESPIRATORY_TRACT | 11 refills | Status: AC
Start: 2024-04-28 — End: ?

## 2024-04-28 NOTE — Progress Notes (Signed)
 Subjective:    Patient ID: Oscar [name], male    DOB: 01-08-1976, 48 y.o.   MRN: [id number]  Patient Care Team: Ernie Yancy Roof, MD as PCP - General (Family Medicine) Donette Ellouise LABOR, FNP as Nurse Practitioner (Cardiology) Florencio Cara BIRCH, MD as Consulting Physician (Cardiology)  Chief Complaint  Patient presents with   COPD    Shortness of breath on exertion and at rest. Reports he has fluid in his chest. He has been f/b cardio.     BACKGROUND/INTERVAL:Patient is a 48 year old morbidly obese former smoker whom I initially evaluated on August 2023.  Last saw Izetta Rouleau, NP on 23 December 2023.  He presents today for follow-up of multiple chronic issues to include COPD, severe obstructive sleep apnea, obesity with alveolar hypoventilation and chronic systolic heart failure to enumerate a few.  HPI Discussed the use of AI scribe software for clinical note transcription with the patient, who gave verbal consent to proceed.  History of Present Illness   Oscar [name] is a 48 year old male with extreme obesity, obesity hypoventilation hypoventilation, severe obstructive sleep apnea, and chronic systolic heart failure who presents with difficulty breathing and sleep issues.  He experiences difficulty breathing, particularly feeling winded with physical activity. He describes episodes where it feels like 'the wind is being sucked out of me,' and notes that his albuterol  inhaler does not alleviate these symptoms. He sometimes experiences wheezing, but not consistently. He uses an albuterol  inhaler as needed when he feels winded.  He has a history of obstructive sleep apnea and has been having trouble sleeping due to issues with his noninvasive ventilator supplies. He mentions that he has asked for new supplies from [employer] but has not received them, and is currently using the same hose, which he feels is not functioning properly. His current machine is approximately four to 49  years old.  He was told by the respiratory therapist who services that that he probably should get new equipment.  He has a history of fluid buildup on his chest, which led him to consult a cardiologist. He mentions feeling winded a couple of times, which prompted the visit. He also reports that it sometimes hurts in his chest area when he has difficulty breathing, which he associates with possibly reflux.  He is currently using Trelegy 100, which he feels is helping, but he still experiences significant symptoms.  He has never had PFTs that have been ordered several times previously.  He has failed to schedule the test.  Reviewed the compliance download from his noninvasive ventilator this is a Trilogy Evo, that shows 100% compliance indicates that the patient uses it during the night as well as during naps.  Average minute ventilation is 10.7, average VTE is 524.8 mL, average breaths per minute 19.9.    DATA 01/26/2015 sleep study [facility]: Severe sleep apnea AHI 49.9/h, REM related AHI 81.6/h desat to 53.6% during sleep.  Patient titrated to CPAP of 14 cm H2O. 05/08/2016 repeat sleep study: Severe sleep apnea, AHI of 73.1/h, O2 desats to 69% eventually titrated to non- invasive ventilation (Trilogy vent). 11/23/2020 echocardiogram: LVEF less than 20%.  Left ventricular function severely depressed.  Grade 1 DD, moderately reduced right ventricular systolic function, moderately elevated pulmonary artery systolic pressure biatrial enlargement.  Mild MR. 10/20/2022 echocardiogram: LVEF less than 20%, global hypokinesis of the left ventricle, moderately dilated left ventricle.  Right ventricular systolic function is normal, right ventricular size is normal mildly elevated pulmonary artery  systolic pressure.  Mild mitral regurgitation. 12/01/2023 echocardiogram: LVEF 30 to 35%, moderate decreased function of the LV.  LV demonstrates regional wall motion abnormalities.  LV cavity size moderately  dilated.  LV diastolic parameters indeterminate.  RV systolic function normal.  RV size normal.  Mild mitral regurgitation.  No aortic valve abnormalities.  Review of Systems A 10 point review of systems was performed and it is as noted above otherwise negative.   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 with body mass index (BMI) of 50.0 to 59.9 in adult [facility]) 12/05/2023   Uncontrolled type 2 diabetes mellitus with hyperglycemia, with long-term current use of insulin  (HCC) 12/05/2023   Acute on chronic respiratory failure with hypoxemia (HCC) 12/05/2023   COVID-19 virus infection 12/04/2023   COVID 12/04/2023   CHF exacerbation (HCC) 12/01/2023   Acute on chronic respiratory failure with hypoxia (HCC) 12/01/2023   Abscess and cellulitis of gluteal region 07/02/2023   Ventricular tachycardia (HCC) 06/12/2023   Demand ischemia (HCC) 06/12/2023   Colon cancer screening    Polyp of sigmoid colon    Idiopathic gout, unspecified site 09/26/2021   Implantable cardioverter-defibrillator (ICD) in situ 09/26/2021   Chronic kidney disease, stage 3a (HCC) 09/26/2021   Hyperlipidemia, unspecified 09/26/2021   Perineal abscess 12/12/2020   Flash pulmonary edema (HCC)    Pulmonary edema 11/25/2020   Obesity, Class III, BMI 40-49.9 (morbid obesity) 11/24/2020   Acute on chronic diastolic (congestive) heart failure (HCC) 11/24/2020   Acute exacerbation of CHF (congestive heart failure) (HCC) 11/23/2020   Hepatic cirrhosis, unspecified hepatic cirrhosis type, unspecified whether ascites present (HCC) 11/23/2020   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) 11/23/2020   Obesity hypoventilation syndrome (HCC) 11/23/2020   Atrial fibrillation with RVR (HCC) 11/23/2020   Acute renal failure superimposed on stage 3a chronic kidney disease (HCC) 03/28/2020   Acute on chronic  combined systolic (congestive) and diastolic (congestive) heart failure (HCC) 02/27/2020   COPD with acute exacerbation (HCC) 02/27/2020   Sprain of foot 01/26/2020   Foreign body granuloma of skin and subcutaneous tissue 05/26/2019   Acute pancreatitis 05/19/2017   Pancreatitis 09/12/2016   Bronchitis 05/20/2016   Constipation 11/08/2015   Abdominal pain 11/02/2015   Obstructive sleep apnea 07/31/2015   Tobacco use 07/31/2015   Diabetes (HCC) 07/10/2015   Chronic systolic heart failure (HCC) 03/30/2015   Cardiac pacemaker 03/30/2015   Chronic respiratory failure with hypercapnia (HCC) 12/12/2014   Elevated troponin    Non-ischemic cardiomyopathy (HCC)    Hypoxemia    Acute combined systolic and diastolic heart failure (HCC)    SVT (supraventricular tachycardia) (HCC) 12/09/2014   NSTEMI (non-ST elevated myocardial infarction) (HCC) 12/09/2014   Hypertension 12/09/2014    Social History   Tobacco Use   Smoking status: Former    Current packs/day: 0.00    Average packs/day: 1.5 packs/day for 34.0 years (51.0 ttl pk-yrs)    Types: Cigarettes    Quit date: 05/2023    Years since quitting: 0.9    Passive exposure: Past   Smokeless tobacco: Never   Tobacco comments:    Quit smoking 05/2023  Substance Use Topics   Alcohol use: No    Allergies  Allergen Reactions   Ciprofloxacin  Swelling and Other (See Comments)    Migraine Headache    Iodinated Contrast Media     Other reaction(s): Vomiting Projectile vomiting  Isosorb Dinitrate-Hydralazine  Other (See Comments)    Migraine Headache    Current Meds  Medication Sig   allopurinol  (ZYLOPRIM ) 100 MG tablet Take 100 mg by mouth daily.   amiodarone  (PACERONE ) 200 MG tablet Take 1 tablet (200 mg total) by mouth daily.   atorvastatin  (LIPITOR ) 80 MG tablet Take 1 tablet (80 mg total) by mouth at bedtime.   colchicine  0.6 MG tablet Take 0.6 mg by mouth daily.   doxycycline  (ADOXA) 100 MG tablet Take 100 mg by mouth 2 (two)  times daily.   Fluticasone -Umeclidin-Vilant (TRELEGY ELLIPTA ) 200-62.5-25 MCG/ACT AEPB Inhale 1 puff into the lungs daily.   insulin  glargine (LANTUS ) 100 UNIT/ML injection Inject 15 Units into the skin daily.   metolazone  (ZAROXOLYN ) 2.5 MG tablet Take 1 tablet (2.5 mg total) by mouth every other day.   metoprolol  succinate (TOPROL -XL) 25 MG 24 hr tablet Take 1 tablet (25 mg total) by mouth daily. Take with or immediately following a meal.   midodrine  (PROAMATINE ) 5 MG tablet Take 1 tablet (5 mg total) by mouth 2 (two) times daily with a meal. Stop if SBP is >120   NON FORMULARY Pt uses a cpap nightly   potassium chloride  SA (KLOR-CON  M) 20 MEQ tablet Take 2 tablets (40 mEq total) by mouth as directed. Take 2 tabs with every dose of metolazone    torsemide  (DEMADEX ) 20 MG tablet Take 40 mg by mouth 2 (two) times daily.   [DISCONTINUED] albuterol  (VENTOLIN  HFA) 108 (90 Base) MCG/ACT inhaler Inhale 2 puffs into the lungs every 6 (six) hours as needed for wheezing or shortness of breath.   [DISCONTINUED] Fluticasone -Umeclidin-Vilant (TRELEGY ELLIPTA ) 100-62.5-25 MCG/ACT AEPB Inhale 1 puff into the lungs daily.    Immunization History  Administered Date(s) Administered   Influenza-Unspecified 03/02/2023   Moderna Sars-Covid-2 Vaccination 12/31/2019, 02/04/2020        Objective:    BP (!) 120/90   Pulse 77   Temp 98 F (36.7 C) (Oral)   Ht 5' 5 (1.651 m)   Wt (!) 304 lb (137.9 kg)   SpO2 97%   BMI 50.59 kg/m   SpO2: 97 %  GENERAL: Morbidly obese man, no acute distress.  Appears older than stated age.  No conversational dyspnea, fully ambulatory. HEAD: Normocephalic, atraumatic.  EYES: Pupils equal, round, reactive to light.  No scleral icterus.  MOUTH: Oral mucosa moist.  No thrush.  Mallampati class IV .  Some missing teeth. NECK: Supple. No thyromegaly. Trachea midline. No JVD.  No adenopathy. PULMONARY: Good air entry bilaterally.  Coarse, otherwise, no adventitious  sounds. CARDIOVASCULAR: S1 and S2. Regular rate and rhythm.  Grade 1-2/6 systolic ejection murmur left sternal border.  ICD present left chest. ABDOMEN: Obese, otherwise benign. MUSCULOSKELETAL: No joint deformity, no clubbing, no edema.  NEUROLOGIC: Neuro grossly normal. SKIN: Intact,warm,dry. PSYCH: Mood and behavior normal.  Lab Results  Component Value Date   NITRICOXIDE 38 04/28/2024  *Evidence of moderate type II inflammation       Assessment & Plan:     ICD-10-CM   1. COPD with asthma (HCC)  J44.89 Pulmonary function test    albuterol  (VENTOLIN  HFA) 108 (90 Base) MCG/ACT inhaler    Nitric oxide     2. Obstructive sleep apnea  G47.33 Cpap titration    3. Class 3 obesity with alveolar hypoventilation without serious comorbidity with body mass index (BMI) of 50.0 to 59.9 in adult (HCC)  Z33.186 Cpap titration   E66.2    Z68.43  4. Stage 3b chronic kidney disease (HCC)  N18.32    This issue has complexity to his management    5. Chronic systolic heart failure (HCC)  P49.77     6. Shortness of breath  R06.02 Pulmonary function test    Nitric oxide     7. Former smoker  Z87.891       Orders Placed This Encounter  Procedures   Nitric oxide    Pulmonary function test    Standing Status:   Future    Expiration Date:   04/28/2025    Where should this test be performed?:   [facility]    What type of PFT is being ordered?:   Full PFT   Cpap titration    Standing Status:   Future    Expected Date:   05/12/2024    Expiration Date:   04/28/2025    Scheduling Instructions:     Titrate to BiPAP/AVAPS as needed.  Known severe obstructive sleep apnea and obesity with obesity hypoventilation.    Where should this test be performed::   [facility]    Meds ordered this encounter  Medications   Fluticasone -Umeclidin-Vilant (TRELEGY ELLIPTA ) 200-62.5-25 MCG/ACT AEPB    Sig: Inhale 1 puff into the lungs daily.    Dispense:  60 each    Refill:  11   albuterol   (VENTOLIN  HFA) 108 (90 Base) MCG/ACT inhaler    Sig: Inhale 2 puffs into the lungs every 6 (six) hours as needed for wheezing or shortness of breath.    Dispense:  8.5 g    Refill:  6   Discussion:    Extreme obesity with alveolar hypoventilation and severe obstructive sleep apnea Extreme obesity with associated alveolar hypoventilation and severe obstructive sleep apnea. Reports difficulty obtaining supplies for noninvasive ventilator from [employer], impacting sleep quality. Current machine is approximately 56-61 years old and may need replacement. Symptoms include dyspnea on exertion and occasional wheezing. Noninvasive ventilator is necessary for management. Potential need for equipment update and titration study to optimize treatment. - Contact [employer] to resolve supply issues for noninvasive ventilator. - Order titration study to assess equipment needs and order new equipment if necessary. - Order download of current machine data from [employer]. - Schedule follow-up in 4-6 weeks to assess progress.  Asthma Asthma with airway inflammation contributing to respiratory symptoms. Reports occasional wheezing and dyspnea, with albuterol  providing limited relief. Increase in Trelegy strength is planned to address inflammation. - Increase strength of Trelegy to 200 mcg, to better manage airway inflammation. - Refill albuterol  inhaler for symptomatic relief. - Schedule pulmonary function tests to be conducted in-office.  Chronic systolic heart failure This issue adds complexity to his management.  Continue follow-up with cardiology and heart failure clinic.  Advised if symptoms do not improve or worsen, to please contact office for sooner follow up or seek emergency care.    I spent 40 minutes of dedicated to the care of this patient on the date of this encounter to include pre-visit review of records, face-to-face time with the patient discussing conditions above, post visit ordering of testing, clinical  documentation with the electronic health record, making appropriate referrals as documented, and communicating necessary findings to members of the patients care team.     C. Leita Sanders, MD [facility]    *This note was generated using voice recognition software/Dragon and/or AI transcription program.  Despite best efforts to proofread, errors can occur which can change the meaning. Any transcriptional errors that result  from this process are unintentional and may not be fully corrected at the time of dictation.

## 2024-04-28 NOTE — Patient Instructions (Signed)
 VISIT SUMMARY:  Today, we discussed your ongoing issues with breathing and sleep. We reviewed your history of extreme obesity, hypoventilation, severe obstructive sleep apnea, and diastolic heart failure. You reported difficulty breathing, especially during physical activity, and issues with your noninvasive ventilator supplies. We also talked about your asthma symptoms and the limited relief from your albuterol  inhaler.  YOUR PLAN:  -EXTREME OBESITY WITH ALVEOLAR HYPOVENTILATION AND SEVERE OBSTRUCTIVE SLEEP APNEA: Extreme obesity with alveolar hypoventilation and severe obstructive sleep apnea means that your weight is affecting your ability to breathe properly, especially during sleep. We will contact [employer] to resolve the supply issues for your noninvasive ventilator and order a titration study to assess your equipment needs. We will also download data from your current machine and schedule breathing tests in-office. Additionally, we will increase the strength of your Trelegy medication to help manage your symptoms. Please follow up in 4-6 weeks to assess your progress.  -ASTHMA: Asthma is a condition where your airways become inflamed, leading to breathing difficulties. We will increase the strength of your Trelegy medication to help manage the inflammation and refill your albuterol  inhaler for symptomatic relief.  INSTRUCTIONS:  Please follow up in 4-6 weeks to assess your progress. Contact [employer] to resolve the supply issues for your noninvasive ventilator. We will also schedule a titration study and breathing tests in-office.

## 2024-05-04 ENCOUNTER — Encounter

## 2024-05-09 ENCOUNTER — Encounter

## 2024-05-11 ENCOUNTER — Encounter

## 2024-05-16 ENCOUNTER — Encounter

## 2024-05-18 ENCOUNTER — Encounter

## 2024-05-19 ENCOUNTER — Ambulatory Visit: Admitting: Pulmonary Disease

## 2024-05-23 ENCOUNTER — Telehealth: Payer: Self-pay | Admitting: Family

## 2024-05-23 ENCOUNTER — Encounter

## 2024-05-23 NOTE — Progress Notes (Unsigned)
 Advanced [facility] Failure Clinic Note    PCP: Antone Bottoms, MD  Primary Cardiologist: Florencio Kava, MD / Elodie Ronal Maxwell, NP (last seen 06/25; returns 09/25) HF provider: Bulah Cornet, MD (last seen 05/24)  Chief Complaint: shortness of breath   HPI:  [name] is a 48 y/o male with a history of obstructive sleep apnea (currently with CPAP), MI, HTN, VT (successful shock X 3), CKD, COPD, PAF, gout, hyperlipidemia, GERD, IDDM, SVT,asthma,  hidradenitis suppurative, chronic LBBB and chronic heart failure with CRT-D (2016).  Stress test 03/01/2019: Abnormal myocardial perfusion scan no clear evidence of reversible ischemia inferior persistent defect either scar versus body habitus artifact severely dilated left ventricular function severely depressed LV function with ejection fraction of around 11%. Recommend medical therapy   Echo 03/01/2019: EF of 15% along with mild MR.  Echo 02/27/20: EF of <20% Echo 11/23/20: EF of <20% along with moderate LVH and moderately elevated PA pressure. Echo 10/20/22: EF <20% along with mildly elevated PA pressure and mild MR.   Was in the ED 04/27/23 due to groin abscess X 1 month with drainage. CT done with f/u to be done with surgeon.   Admitted 06/12/23 due to ICD firing after sudden onset of SOB, chest tightness and dizziness. He was able to stand up and make it to the bathroom when he felt his ICD firing again. Afterwards, his shortness of breath, chest pain and dizziness resolved. He called EMS and on route to the ED, he was told that his ICD fired again. Successful conversion after the 3rd shock. Cardiology consulted. Needed amiodarone  drip with transition to or amiodarone .   CPET done 10/09/23 revealed sub-maximal exercise test, so maximal cardiorespiratory capacity could not be determined. Moderate to severe functional impairment appreciated. There were signs of a mixed cardiopulmonary limitation to exercise.   Was in the ED 11/07/23 with right  elbow pain. Has a history of gout. Was given colchicine  but has to wait 3 days before starting because he had been taking it daily for several weeks.  Admitted 12/01/23 with HF/ COPD exacerbation. IV diuresed. Cardiology consulted. IV solu-medrol  given with nebulizer, antibiotics. Given prednisone  taper at discharge. Troponin elevated due to demand ischemia. Echo 12/01/23: EF 30-35% with normal RV, mild MR  Was in the ED 12/03/23 with an episode of chest pain when getting in the shower. Also had lightheadedness and shortness of breath. Trponin 80 & then 130. EKG without acute ischemia. SOB improved and he was released.   Admitted 12/04/23 with worsening shortness of breath. Found to be + covid. Given IV lasix  for a/c heart failure. Given IV steroids with transition to oral prednisone  for COPD exacerbation. Had episode of chest pain, improved after giving bronchodilator. Troponin trending down not consistent with ACS.   Seen in [facility] 04/25 and had torsemide  decreased to 20mg  daily after getting BMET back.   Seen in [facility] 06/25 and sent for IV lasix . Amlodipine  and losartan  were both stopped due to hypotension.   Seen in [facility] 04/18/24 fluid up with elevated ReDs reading and worsening symptoms where torsemide  was increased to 80mg  daily and farxiga  was stopped. Midodrine  started BID as he rarely took it because he wasn't checking his BP.   He presents today for a HF follow-up visit with a chief complaint of shortness of breath (worsening) both at rest and on exertion. He further endorses right sided chest pain, fatigue, lightheadedness, abdominal distention, and he feels heart pulsing. He uses 5 pillows at night and endorses  poor sleep quality due to orthopnea and PND. His symptoms began over the weekend. He did taken Toresmide 80mg  TID which did help to reduce his weight but not his symptoms . States his weight went from 315lbs over the weekend to 310lbs today. He did not take any additional potassium. He reports  adherence with all further medication. He did complete metolazone  2.5mg  every other day X 3 doses per last visit.   He is currently using continuous O2 on 3L daily with symptom relief but endorses if it is removed he will be short of breath. Reports that on Friday, [employer] came to remove ventilator from home due to insurance coverage and replaced it with a BiPAP machine that provides no relief.   He also expresses concerns of potential UTI. His has noticed a weak stream and low urinary output, along with right side abdominal and bilaterally flank pain. Denies dysuria, burning, and fever. He did have an appt with Urology yesterday to discuss surgery for a cyst. UTI symptoms not discussed. Patient expresses need to got to ED to have rule out UTI.   He was seen on 05/13/24 with his PCP where similar symptoms of shortness of breath were present. Provider discussed potentional ICD wire expiration. Patint has an appt November 12th to have ICD wires changed.   He denies any dietary changes. Continues to 48 ounces of fluid daily and using Mrs Hollis for seasoning.   ROS: All systems negative except as listed in HPI, PMH and Problem List.  SH:  Social History   Socioeconomic History   Marital status: Single    Spouse name: Not on file   Number of children: 0   Years of education: Not on file   Highest education level: Some college, no degree  Occupational History   Occupation: unemployed  Tobacco Use   Smoking status: Former    Current packs/day: 0.00    Average packs/day: 1.5 packs/day for 34.0 years (51.0 ttl pk-yrs)    Types: Cigarettes    Quit date: 05/2023    Years since quitting: 1.0    Passive exposure: Past   Smokeless tobacco: Never   Tobacco comments:    Quit smoking 05/2023  Vaping Use   Vaping status: Never Used  Substance and Sexual Activity   Alcohol use: No   Drug use: No   Sexual activity: Yes  Other Topics Concern   Not on file  Social History Narrative   Live alone    Social Drivers of Health   Financial Resource Strain: Medium Risk (12/02/2023)   Overall Financial Resource Strain (CARDIA)    Difficulty of Paying Living Expenses: Somewhat hard  Food Insecurity: No Food Insecurity (12/04/2023)   Hunger Vital Sign    Worried About Running Out of Food in the Last Year: Never true    Ran Out of Food in the Last Year: Never true  Recent Concern: Food Insecurity - Food Insecurity Present (11/23/2023)   Received from [facility]   Hunger Vital Sign    Within the past 12 months, you worried that your food would run out before you got the money to buy more.: Sometimes true    Within the past 12 months, the food you bought just didn't last and you didn't have money to get more.: Sometimes true  Transportation Needs: No Transportation Needs (12/04/2023)   PRAPARE - Administrator, Civil Service (Medical): No    Lack of Transportation (Non-Medical): No  Physical Activity:  Not on file  Stress: Not on file  Social Connections: Moderately Isolated (12/04/2023)   Social Connection and Isolation Panel    Frequency of Communication with Friends and Family: More than three times a week    Frequency of Social Gatherings with Friends and Family: Once a week    Attends Religious Services: More than 4 times per year    Active Member of [employer] or Organizations: No    Attends Banker Meetings: Never    Marital Status: Never married  Intimate Partner Violence: Not At Risk (12/04/2023)   Humiliation, Afraid, Rape, and Kick questionnaire    Fear of Current or Ex-Partner: No    Emotionally Abused: No    Physically Abused: No    Sexually Abused: No    FH:  Family History  Problem Relation Age of Onset   Hypertension Mother    Congestive Heart Failure Mother    Hypertension Sister    Diabetes Sister    Pancreatitis Sister    COPD Sister    Emphysema Sister        smoked   Pancreatitis Brother    Anemia Neg Hx    Arrhythmia Neg  Hx    Asthma Neg Hx    Clotting disorder Neg Hx    Fainting Neg Hx    Heart attack Neg Hx    Heart disease Neg Hx    Heart failure Neg Hx    Hyperlipidemia Neg Hx     Past Medical History:  Diagnosis Date   AICD (automatic cardioverter/defibrillator) present    Aortic atherosclerosis    Asthma    Chronic respiratory failure (HCC)    CKD (chronic kidney disease), stage III (HCC)    COPD (chronic obstructive pulmonary disease) (HCC)    Deafness in right ear    Diabetes mellitus without complication (HCC)    Dyspnea    GERD (gastroesophageal reflux disease)    Gout    HFrEF (heart failure with reduced ejection fraction) (HCC)    a.) TTE 12/10/14: EF 25%; diff inf HK; sev LV and mod LA dil; LVH. b.) TTE 06/23/18: EF 20-25%; LVH; mild LV dil, mild BAE. c.) TTE 03/01/19: EF 15%, LVH, BAE; triv PR/TR. d.) TTE 02/27/20: EF < 20%; sev LV dil; mild MR. e.) TTE 11/23/20: EF < 20%; glob HK; sev LV dil; LVH; mild-mod BAE; mod-sev TR, triv AR; G1DD. f.) TTE 05/14/21: EF <15%; LVH; sev LA and mild RV enlar; triv AR/PR, mild TR, mod MR.   Hiatal hernia    History of cardiac catheterization    a.) R/LHC 12/14/2009: normal coronaries. b.) R/LHC 12/11/2014: normal coronaries.   Hyperlipidemia    Hypertension    Hypoxemia    LBBB (left bundle branch block)    NICM (nonischemic cardiomyopathy; dilated cardiomyopathy) (HCC)    a.) R/LHC 12/14/2009: normal cors; LVEDP 18 mmHg, mean PA 29 mmHg, mean PCWP 31 mmHg; CO 6 L/min; CI 2.54 L/min/m. b.) TTE 12/10/2014: EF 25%. c.) R/LHC 12/11/2014: mean RA 9 mmHg, mean PA 22 mmHg, mean PCWP 22 mmHg. d.) TTE 06/23/2018: EF 20-25%. e.) TTE 03/01/2019: EF 15%. f.) TTE 02/27/2020: < 20%. g.) TTE 11/23/2020: EF <20%. h.) TTE 05/14/2021: < 15%.   NSTEMI (non-ST elevated myocardial infarction) (HCC)    a.) x 3 per patient report ---> 2012, 2014, 2016   Obesity    On supplemental oxygen by nasal cannula    a.) 2-3 L/[place] PRN   OSA on CPAP  PAF (paroxysmal atrial  fibrillation) (HCC)    a.) CHA2DS2-VASc = 4 (HFrEF, HTN, prior MI, T2DM). b.) rate/rhythm maintained without pharmacologial intervention; no current anticoagulation.   Pancreatitis    PSVT (paroxysmal supraventricular tachycardia)     Current Outpatient Medications  Medication Sig Dispense Refill   albuterol  (VENTOLIN  HFA) 108 (90 Base) MCG/ACT inhaler Inhale 2 puffs into the lungs every 6 (six) hours as needed for wheezing or shortness of breath. 8.5 g 6   allopurinol  (ZYLOPRIM ) 100 MG tablet Take 100 mg by mouth daily.     amiodarone  (PACERONE ) 200 MG tablet Take 1 tablet (200 mg total) by mouth daily.     atorvastatin  (LIPITOR ) 80 MG tablet Take 1 tablet (80 mg total) by mouth at bedtime. 90 tablet 3   colchicine  0.6 MG tablet Take 0.6 mg by mouth daily.     doxycycline  (ADOXA) 100 MG tablet Take 100 mg by mouth 2 (two) times daily.     Fluticasone -Umeclidin-Vilant (TRELEGY ELLIPTA ) 200-62.5-25 MCG/ACT AEPB Inhale 1 puff into the lungs daily. 60 each 11   insulin  glargine (LANTUS ) 100 UNIT/ML injection Inject 15 Units into the skin daily.     metolazone  (ZAROXOLYN ) 2.5 MG tablet Take 1 tablet (2.5 mg total) by mouth every other day. 3 tablet 0   metoprolol  succinate (TOPROL -XL) 25 MG 24 hr tablet Take 1 tablet (25 mg total) by mouth daily. Take with or immediately following a meal. 30 tablet 11   midodrine  (PROAMATINE ) 5 MG tablet Take 1 tablet (5 mg total) by mouth 2 (two) times daily with a meal. Stop if SBP is >120     NON FORMULARY Pt uses a cpap nightly     pantoprazole  (PROTONIX ) 40 MG tablet Take 1 tablet (40 mg total) by mouth daily. (Patient not taking: Reported on 04/28/2024)     potassium chloride  SA (KLOR-CON  M) 20 MEQ tablet Take 2 tablets (40 mEq total) by mouth as directed. Take 2 tabs with every dose of metolazone  15 tablet 0   torsemide  (DEMADEX ) 20 MG tablet Take 40 mg by mouth 2 (two) times daily.     No current facility-administered medications for this visit.   There  were no vitals filed for this visit.  Wt Readings from Last 3 Encounters:  04/28/24 (!) 137.9 kg  04/27/24 (!) 137.9 kg  04/20/24 (!) 140.7 kg   Lab Results  Component Value Date   CREATININE 2.44 (H) 04/27/2024   CREATININE 1.89 (H) 04/20/2024   CREATININE 1.90 (H) 04/04/2024    PHYSICAL EXAM:  General: Well appearing. No acute signs of distress. No SOB when talking Cor: No JVD. Regular rhythm, rate.  Lungs: Clear bilaterally. Symmetrical chest expansion. No cough.  Abdomen: Tight, distended, nontender.  Extremities: No edema Neuro:  AO x3. Affect pleasant. Appropriate speech and judgement.    ECG: not done   ASSESSMENT & PLAN:  1: NICM with reduced ejection fraction- - NYHA class III - Euvolemic  - Weight up 7 lbs pounds from last visit here nearly 1 month ago.  - IV lasix  80mg  and potassium 40mg  today for symptoms relief  - Stress test 03/01/2019: Abnormal myocardial perfusion scan no clear evidence of reversible ischemia inferior persistent defect either scar versus body habitus artifact severely dilated left ventricular function severely depressed LV function with ejection fraction of around 11%.   - Echo 03/01/2019: EF of 15% along with mild MR.  - Echo 02/27/20: EF of <20% - Echo 11/23/20: EF of <20% along with  moderate LVH and moderately elevated PA pressure. - Echo 10/20/22: EF <20% along with mildly elevated PA pressure and mild MR.  - Echo 12/01/23: EF 30-35% with normal RV, mild MR - AICD (2016) in place but denies any shocks in a long time - GDMT limited by hypotension and CKD - farxiga  stopped at last visit due to continued groin infection and possibly contributing to his hypotension - continue metoprolol  succinate 25mg  daily  - continue torsemide  40mg  daily.  - in the past, spironolactone , losartan , entresto  has caused hypotension - saw HF provider Pearl) 05/24 - BNP 04/27/24 was 350.9. BNP  - BMET today   2: HTN with CKD- - BP 106/84 - continue  midodrine  5mg  BID unless SBP is >120.  - sees PCP (Mancheno) at [facility]  - BMP 04/27/24 reviewed: sodium 133, potassium 3.5, Cr 2.44 and GFR 32. - BMET today - saw nephrology Kolleen) 08/25, next appt 11/25  3: Obstructive sleep apnea- - poor sleep with use of Bipap  - wearing oxygen at 3L daily for symptoms relief  - patient to follow up with pulmonology for ventilator changes.  - upcoming sleep study per pt  - saw pulmonology Doyal) 04/28/24  4: Diabetes (managed by PCP)- - A1c 12/03/23 was 8.5% - continue lantus  15 units daily   5: Atrial fibrillation- - saw cardiology Debby) 06/25 - continue amiodarone  200mg  daily  - adamant that he does not want to resume apixaban ; aware of stroke risk - last eye exam ~ 03/25 - TSH 02/24/24 was 4.803  6: VT- - Mg 12/06/23 reviewed and was 2.3.  - Saw EP, Dr. Debby 05/13/24 to evaluate for CRT-D upgrade with HFrEF and LBBB.   - ICD wires to be changed 07/13/24 with EP Cathy)  7: Hyperlipidemia- - LDL 02/24/24 was 157 - continue atorvastatin  80mg  daily (increased 06/25)  8. UTI  - advised patient to see PCP to rule out    Discussed risk vs benefits of going to ED. Patient verbalized understanding. If symptoms worsen go to the ED.    Return Friday 05/27/24 , sooner if needed.   Solomon Blumenthal, RN 05/23/24

## 2024-05-23 NOTE — Telephone Encounter (Signed)
 Called to confirm/remind patient of their appointment at the [facility] on 05/24/24.   Appointment:   [x] Confirmed  [] Left mess   [] No answer/No voice mail  [] VM Full/unable to leave message  [] Phone not in service  Patient reminded to bring all medications and/or complete list.  Confirmed patient has transportation. Gave directions, instructed to utilize valet parking.

## 2024-05-24 ENCOUNTER — Encounter: Payer: Self-pay | Admitting: Family

## 2024-05-24 ENCOUNTER — Ambulatory Visit (HOSPITAL_BASED_OUTPATIENT_CLINIC_OR_DEPARTMENT_OTHER): Admitting: Family

## 2024-05-24 ENCOUNTER — Other Ambulatory Visit: Payer: Self-pay | Admitting: Family

## 2024-05-24 ENCOUNTER — Ambulatory Visit: Payer: Self-pay | Admitting: Family

## 2024-05-24 ENCOUNTER — Emergency Department
Admission: EM | Admit: 2024-05-24 | Discharge: 2024-05-24 | Disposition: A | Discharge status: Home / Self Care | Attending: Emergency Medicine | Admitting: Emergency Medicine

## 2024-05-24 ENCOUNTER — Ambulatory Visit
Admission: RE | Admit: 2024-05-24 | Discharge: 2024-05-24 | Disposition: A | Discharge status: Home / Self Care | Source: Ambulatory Visit | Attending: Family | Admitting: Family

## 2024-05-24 ENCOUNTER — Other Ambulatory Visit: Payer: Self-pay

## 2024-05-24 ENCOUNTER — Emergency Department

## 2024-05-24 VITALS — BP 106/84 | HR 84 | Wt 310.0 lb

## 2024-05-24 DIAGNOSIS — Z9981 Dependence on supplemental oxygen: Secondary | ICD-10-CM | POA: Insufficient documentation

## 2024-05-24 DIAGNOSIS — L732 Hidradenitis suppurativa: Secondary | ICD-10-CM | POA: Insufficient documentation

## 2024-05-24 DIAGNOSIS — I5020 Unspecified systolic (congestive) heart failure: Secondary | ICD-10-CM

## 2024-05-24 DIAGNOSIS — N183 Chronic kidney disease, stage 3 unspecified: Secondary | ICD-10-CM | POA: Insufficient documentation

## 2024-05-24 DIAGNOSIS — R059 Cough, unspecified: Secondary | ICD-10-CM

## 2024-05-24 DIAGNOSIS — R350 Frequency of micturition: Secondary | ICD-10-CM | POA: Insufficient documentation

## 2024-05-24 DIAGNOSIS — I130 Hypertensive heart and chronic kidney disease with heart failure and stage 1 through stage 4 chronic kidney disease, or unspecified chronic kidney disease: Secondary | ICD-10-CM | POA: Insufficient documentation

## 2024-05-24 DIAGNOSIS — Z95810 Presence of automatic (implantable) cardiac defibrillator: Secondary | ICD-10-CM | POA: Insufficient documentation

## 2024-05-24 DIAGNOSIS — I110 Hypertensive heart disease with heart failure: Secondary | ICD-10-CM | POA: Diagnosis not present

## 2024-05-24 DIAGNOSIS — Z8616 Personal history of COVID-19: Secondary | ICD-10-CM | POA: Insufficient documentation

## 2024-05-24 DIAGNOSIS — Z794 Long term (current) use of insulin: Secondary | ICD-10-CM | POA: Insufficient documentation

## 2024-05-24 DIAGNOSIS — Z87891 Personal history of nicotine dependence: Secondary | ICD-10-CM | POA: Insufficient documentation

## 2024-05-24 DIAGNOSIS — E119 Type 2 diabetes mellitus without complications: Secondary | ICD-10-CM

## 2024-05-24 DIAGNOSIS — I5022 Chronic systolic (congestive) heart failure: Secondary | ICD-10-CM | POA: Diagnosis present

## 2024-05-24 DIAGNOSIS — R0602 Shortness of breath: Secondary | ICD-10-CM | POA: Diagnosis present

## 2024-05-24 DIAGNOSIS — I447 Left bundle-branch block, unspecified: Secondary | ICD-10-CM | POA: Insufficient documentation

## 2024-05-24 DIAGNOSIS — I471 Supraventricular tachycardia, unspecified: Secondary | ICD-10-CM | POA: Insufficient documentation

## 2024-05-24 DIAGNOSIS — I509 Heart failure, unspecified: Secondary | ICD-10-CM | POA: Insufficient documentation

## 2024-05-24 DIAGNOSIS — I2510 Atherosclerotic heart disease of native coronary artery without angina pectoris: Secondary | ICD-10-CM | POA: Insufficient documentation

## 2024-05-24 DIAGNOSIS — Z79899 Other long term (current) drug therapy: Secondary | ICD-10-CM | POA: Insufficient documentation

## 2024-05-24 DIAGNOSIS — N390 Urinary tract infection, site not specified: Secondary | ICD-10-CM | POA: Insufficient documentation

## 2024-05-24 DIAGNOSIS — E785 Hyperlipidemia, unspecified: Secondary | ICD-10-CM | POA: Insufficient documentation

## 2024-05-24 DIAGNOSIS — I472 Ventricular tachycardia, unspecified: Secondary | ICD-10-CM | POA: Diagnosis not present

## 2024-05-24 DIAGNOSIS — K219 Gastro-esophageal reflux disease without esophagitis: Secondary | ICD-10-CM | POA: Insufficient documentation

## 2024-05-24 DIAGNOSIS — J4489 Other specified chronic obstructive pulmonary disease: Secondary | ICD-10-CM | POA: Insufficient documentation

## 2024-05-24 DIAGNOSIS — N1831 Chronic kidney disease, stage 3a: Secondary | ICD-10-CM

## 2024-05-24 DIAGNOSIS — E1122 Type 2 diabetes mellitus with diabetic chronic kidney disease: Secondary | ICD-10-CM | POA: Insufficient documentation

## 2024-05-24 DIAGNOSIS — J449 Chronic obstructive pulmonary disease, unspecified: Secondary | ICD-10-CM | POA: Diagnosis not present

## 2024-05-24 DIAGNOSIS — I252 Old myocardial infarction: Secondary | ICD-10-CM | POA: Insufficient documentation

## 2024-05-24 DIAGNOSIS — I10 Essential (primary) hypertension: Secondary | ICD-10-CM | POA: Diagnosis not present

## 2024-05-24 DIAGNOSIS — I480 Paroxysmal atrial fibrillation: Secondary | ICD-10-CM | POA: Insufficient documentation

## 2024-05-24 DIAGNOSIS — G4733 Obstructive sleep apnea (adult) (pediatric): Secondary | ICD-10-CM | POA: Insufficient documentation

## 2024-05-24 DIAGNOSIS — E782 Mixed hyperlipidemia: Secondary | ICD-10-CM

## 2024-05-24 DIAGNOSIS — I428 Other cardiomyopathies: Secondary | ICD-10-CM | POA: Insufficient documentation

## 2024-05-24 LAB — URINALYSIS, W/ REFLEX TO CULTURE (INFECTION SUSPECTED)
Bilirubin Urine: NEGATIVE
Glucose, UA: NEGATIVE mg/dL
Hgb urine dipstick: NEGATIVE
Ketones, ur: NEGATIVE mg/dL
Leukocytes,Ua: NEGATIVE
Nitrite: NEGATIVE
Protein, ur: NEGATIVE mg/dL
Specific Gravity, Urine: 1.006 (ref 1.005–1.030)
pH: 7 (ref 5.0–8.0)

## 2024-05-24 LAB — BRAIN NATRIURETIC PEPTIDE: B Natriuretic Peptide: 413.5 pg/mL — ABNORMAL HIGH (ref 0.0–100.0)

## 2024-05-24 LAB — CBC
HCT: 37.8 % — ABNORMAL LOW (ref 39.0–52.0)
Hemoglobin: 12.6 g/dL — ABNORMAL LOW (ref 13.0–17.0)
MCH: 31.7 pg (ref 26.0–34.0)
MCHC: 33.3 g/dL (ref 30.0–36.0)
MCV: 95.2 fL (ref 80.0–100.0)
Platelets: 140 K/uL — ABNORMAL LOW (ref 150–400)
RBC: 3.97 MIL/uL — ABNORMAL LOW (ref 4.22–5.81)
RDW: 13.2 % (ref 11.5–15.5)
WBC: 4.3 K/uL (ref 4.0–10.5)
nRBC: 0 % (ref 0.0–0.2)

## 2024-05-24 LAB — RESP PANEL BY RT-PCR (RSV, FLU A&B, COVID)  RVPGX2
Influenza A by PCR: NEGATIVE
Influenza B by PCR: NEGATIVE
Resp Syncytial Virus by PCR: NEGATIVE
SARS Coronavirus 2 by RT PCR: NEGATIVE

## 2024-05-24 LAB — BASIC METABOLIC PANEL WITH GFR
Anion gap: 9 (ref 5–15)
BUN: 44 mg/dL — ABNORMAL HIGH (ref 6–20)
CO2: 27 mmol/L (ref 22–32)
Calcium: 8.7 mg/dL — ABNORMAL LOW (ref 8.9–10.3)
Chloride: 99 mmol/L (ref 98–111)
Creatinine, Ser: 1.85 mg/dL — ABNORMAL HIGH (ref 0.61–1.24)
GFR, Estimated: 44 mL/min — ABNORMAL LOW (ref >60)
Glucose, Bld: 213 mg/dL — ABNORMAL HIGH (ref 70–99)
Potassium: 4 mmol/L (ref 3.5–5.1)
Sodium: 135 mmol/L (ref 135–145)

## 2024-05-24 LAB — TROPONIN I (HIGH SENSITIVITY)
Troponin I (High Sensitivity): 70 ng/L — ABNORMAL HIGH (ref <18)
Troponin I (High Sensitivity): 68 ng/L — ABNORMAL HIGH (ref <18)

## 2024-05-24 MED ORDER — FUROSEMIDE 10 MG/ML IJ SOLN
INTRAMUSCULAR | Status: AC
  Filled 2024-05-24: qty 8

## 2024-05-24 MED ORDER — FUROSEMIDE 10 MG/ML IJ SOLN
80.0000 mg | Freq: Once | INTRAMUSCULAR | Status: AC
  Administered 2024-05-24: 80 mg via INTRAVENOUS

## 2024-05-24 MED ORDER — POTASSIUM CHLORIDE CRYS ER 20 MEQ PO TBCR
40.0000 meq | EXTENDED_RELEASE_TABLET | Freq: Once | ORAL | Status: AC
  Administered 2024-05-24: 40 meq via ORAL

## 2024-05-24 MED ORDER — POTASSIUM CHLORIDE CRYS ER 20 MEQ PO TBCR
EXTENDED_RELEASE_TABLET | ORAL | Status: AC
  Filled 2024-05-24: qty 2

## 2024-05-24 NOTE — Progress Notes (Signed)
 Patient here in SDS from [facility] for labs, IV Lasix  and PO Potassium. IV inserted to right AC and flushed. Patient requesting to go to ED after SDS and patient was wheeled down in w/c by volunteer. IV left in place.

## 2024-05-24 NOTE — ED Triage Notes (Addendum)
 Pt to ED via POV from heart clinic. Pt reports increased SOB, chest tightness, cough and congestion. PT reports possible UTI as well. Pt was sent for diuresis due to hx of CHF. Wears 3L [place] chronically. Pt was brought down without oxygen tank but with tubing.   BNP and BMP done at heart care. BNP 413

## 2024-05-24 NOTE — ED Provider Notes (Signed)
 [facility] Provider Note    Event Date/Time   First MD Initiated Contact with Patient 05/24/24 1142     (approximate)   History   Shortness of Breath   HPI  Oscar [name] is a 48 year old male presenting to the ER for evaluation of congestion and urinary frequency.  Patient has a history of OSA, CAD, HTN, COPD, CHF for which she follows with heart failure clinic.  Had a visit today during which she was planned for IV diuresis with an outpatient (not to be admitted for diuresis, clarification from triage note).  Did get his dose of this and does not feel that he has had any worsening of his breathing since his appointment.  However, he notes that he has had some cough and congestion and presented to the ER as he wanted a COVID test.  He additionally reports that he has had some urinary frequency which he thinks may be related to diuretic but wants to see if he has a urine infection.  Wears 3 L nasal cannula oxygen at home.  Denies feeling short of breath on this currently.   Physical Exam   Triage Vital Signs: ED Triage Vitals  Encounter Vitals Group     BP 05/24/24 1056 108/80     Girls Systolic BP Percentile --      Girls Diastolic BP Percentile --      Boys Systolic BP Percentile --      Boys Diastolic BP Percentile --      Pulse Rate 05/24/24 1055 72     Resp 05/24/24 1055 18     Temp 05/24/24 1055 98.1 F (36.7 C)     Temp Source 05/24/24 1055 Oral     SpO2 05/24/24 1055 98 %     Weight --      Height --      Head Circumference --      Peak Flow --      Pain Score 05/24/24 1054 4     Pain Loc --      Pain Education --      Exclude from Growth Chart --     Most recent vital signs: Vitals:   05/24/24 1055 05/24/24 1056  BP:  108/80  Pulse: 72   Resp: 18   Temp: 98.1 F (36.7 C)   SpO2: 98%      General: Awake, interactive  CV:  Regular rate, good peripheral perfusion.  Resp:  Unlabored respirations, lung sounds mildly  diminished Abd:  Nondistended, soft, nontender Neuro:  Symmetric facial movement, fluid speech   ED Results / Procedures / Treatments   Labs (all labs ordered are listed, but only abnormal results are displayed) Labs Reviewed  CBC - Abnormal; Notable for the following components:      Result Value   RBC 3.97 (*)    Hemoglobin 12.6 (*)    HCT 37.8 (*)    Platelets 140 (*)    All other components within normal limits  URINALYSIS, W/ REFLEX TO CULTURE (INFECTION SUSPECTED) - Abnormal; Notable for the following components:   Color, Urine STRAW (*)    APPearance CLEAR (*)    Bacteria, UA RARE (*)    All other components within normal limits  TROPONIN I (HIGH SENSITIVITY) - Abnormal; Notable for the following components:   Troponin I (High Sensitivity) 70 (*)    All other components within normal limits  TROPONIN I (HIGH SENSITIVITY) - Abnormal; Notable for the following components:  Troponin I (High Sensitivity) 68 (*)    All other components within normal limits  RESP PANEL BY RT-PCR (RSV, FLU A&B, COVID)  [id number]     EKG EKG independently reviewed and interpreted by myself demonstrates:  EKG demonstrates sinus rhythm at a rate of 82, PR 202, QRS 176, QTc 567, nonspecific ST changes morphology overall similar to prior from 4/425  RADIOLOGY Imaging independently reviewed and interpreted by myself demonstrates:  CXR with cardiomegaly, possible mild edema  Formal Radiology Read:  [facility] Chest Port 1 View Result Date: 05/24/2024 CLINICAL DATA:  Shortness of breath. EXAM: PORTABLE CHEST 1 VIEW COMPARISON:  Radiographs 12/05/2023 and 12/03/2023. Chest CTA 11/23/2020. FINDINGS: 1118 hours. Left subclavian AICD leads appear unchanged, projecting over the right atrium and right ventricle. The heart is enlarged. There is increased vascular congestion with possible mild interstitial edema. No confluent airspace disease, pneumothorax or significant pleural effusion. The bones appear unchanged.  IMPRESSION: Cardiomegaly with increased vascular congestion and possible mild interstitial edema. Electronically Signed   By: Elsie Perone M.D.   On: 05/24/2024 11:57    PROCEDURES:  Critical Care performed: No  Procedures   MEDICATIONS ORDERED IN ED: Medications - No data to display   IMPRESSION / MDM / ASSESSMENT AND PLAN / ED COURSE  I reviewed the triage vital signs and the nursing notes.  Differential diagnosis includes, but is not limited to, CHF exacerbation, viral illness, lower suspicion ACS, UTI, urinary frequency related to diuresis  Patient's presentation is most consistent with acute presentation with potential threat to life or bodily function.  48 year old male presenting to the emergency department for evaluation of cough and congestion.  Had labs performed in clinic demonstrating stable renal impairment, normal potassium at 4.  BNP elevated at 413.  Did get a dose of 80 mg of IV Lasix  already.  Labs here with CBC with mild anemia not significantly changed from prior.  Initial troponin elevated at 70, history of chronic troponin elevation, repeat stable at 68.  Suspect likely baseline troponin leak versus possible mild elevation in the setting of fluid overload.  BNP is elevated with some signs of pulmonary edema on x-Dio Giller, but patient is satting appropriately on his home oxygen without significantly labored respirations.  He does have his heart failure team that coordinating outpatient diuresis.  With this, do not feel that he requires inpatient admission for management of his CHF currently.  His viral swab came back negative.  Urinalysis without evidence of infection.  Patient updated on these results and continues to report that he would like to be discharged home which I do think is reasonable.  Strict return precautions provided.  Patient discharged stable condition.     FINAL CLINICAL IMPRESSION(S) / ED DIAGNOSES   Final diagnoses:  Congestive heart failure,  unspecified HF chronicity, unspecified heart failure type (HCC)  Cough, unspecified type  Urinary frequency     Rx / DC Orders   ED Discharge Orders     None        Note:  This document was prepared using Dragon voice recognition software and may include unintentional dictation errors.   Levander Slate, MD 05/24/24 [phone]

## 2024-05-24 NOTE — Discharge Instructions (Signed)
 Continue to follow-up with your primary care doctor and heart failure team for further evaluation of your symptoms.  Return to the ER for new or worsening symptoms.

## 2024-05-25 ENCOUNTER — Encounter

## 2024-05-25 DIAGNOSIS — I5022 Chronic systolic (congestive) heart failure: Secondary | ICD-10-CM

## 2024-05-25 NOTE — Progress Notes (Signed)
 Cardiac Individual Treatment Plan  Patient Details  Name: Oscar [name] MRN: [id number] Date of Birth: [birth year] Referring Provider:   Flowsheet Row Cardiac Rehab from 01/26/2024 in [facility] Cardiac and Pulmonary Rehab  Referring Provider Florencio Kava, MD    Initial Encounter Date:  Flowsheet Row Cardiac Rehab from 01/26/2024 in [facility] Cardiac and Pulmonary Rehab  Date 01/26/24    Visit Diagnosis: Chronic systolic heart failure (HCC)  Patient's Home Medications on Admission:  Current Outpatient Medications:    albuterol  (VENTOLIN  HFA) 108 (90 Base) MCG/ACT inhaler, Inhale 2 puffs into the lungs every 6 (six) hours as needed for wheezing or shortness of breath., Disp: 8.5 g, Rfl: 6   allopurinol  (ZYLOPRIM ) 100 MG tablet, Take 100 mg by mouth daily., Disp: , Rfl:    amiodarone  (PACERONE ) 200 MG tablet, Take 1 tablet (200 mg total) by mouth daily., Disp: , Rfl:    atorvastatin  (LIPITOR ) 80 MG tablet, Take 1 tablet (80 mg total) by mouth at bedtime., Disp: 90 tablet, Rfl: 3   colchicine  0.6 MG tablet, Take 0.6 mg by mouth daily., Disp: , Rfl:    doxycycline  (ADOXA) 100 MG tablet, Take 100 mg by mouth 2 (two) times daily., Disp: , Rfl:    Fluticasone -Umeclidin-Vilant (TRELEGY ELLIPTA ) 200-62.5-25 MCG/ACT AEPB, Inhale 1 puff into the lungs daily., Disp: 60 each, Rfl: 11   insulin  glargine (LANTUS ) 100 UNIT/ML injection, Inject 15 Units into the skin daily., Disp: , Rfl:    metolazone  (ZAROXOLYN ) 2.5 MG tablet, Take 1 tablet (2.5 mg total) by mouth every other day. (Patient not taking: Reported on 05/24/2024), Disp: 3 tablet, Rfl: 0   metoprolol  succinate (TOPROL -XL) 25 MG 24 hr tablet, Take 1 tablet (25 mg total) by mouth daily. Take with or immediately following a meal., Disp: 30 tablet, Rfl: 11   midodrine  (PROAMATINE ) 5 MG tablet, Take 1 tablet (5 mg total) by mouth 2 (two) times daily with a meal. Stop if SBP is >120, Disp: , Rfl:    NON FORMULARY, Pt uses a cpap nightly, Disp: , Rfl:     pantoprazole  (PROTONIX ) 40 MG tablet, Take 1 tablet (40 mg total) by mouth daily., Disp: , Rfl:    potassium chloride  SA (KLOR-CON  M) 20 MEQ tablet, Take 2 tablets (40 mEq total) by mouth as directed. Take 2 tabs with every dose of metolazone , Disp: 15 tablet, Rfl: 0   torsemide  (DEMADEX ) 20 MG tablet, Take 40 mg by mouth 2 (two) times daily., Disp: , Rfl:   Past Medical History: Past Medical History:  Diagnosis Date   AICD (automatic cardioverter/defibrillator) present    Aortic atherosclerosis    Asthma    Chronic respiratory failure (HCC)    CKD (chronic kidney disease), stage III (HCC)    COPD (chronic obstructive pulmonary disease) (HCC)    Deafness in right ear    Diabetes mellitus without complication (HCC)    Dyspnea    GERD (gastroesophageal reflux disease)    Gout    HFrEF (heart failure with reduced ejection fraction) (HCC)    a.) TTE 12/10/14: EF 25%; diff inf HK; sev LV and mod LA dil; LVH. b.) TTE 06/23/18: EF 20-25%; LVH; mild LV dil, mild BAE. c.) TTE 03/01/19: EF 15%, LVH, BAE; triv PR/TR. d.) TTE 02/27/20: EF < 20%; sev LV dil; mild MR. e.) TTE 11/23/20: EF < 20%; glob HK; sev LV dil; LVH; mild-mod BAE; mod-sev TR, triv AR; G1DD. f.) TTE 05/14/21: EF <15%; LVH; sev LA and mild RV enlar;  triv AR/PR, mild TR, mod MR.   Hiatal hernia    History of cardiac catheterization    a.) R/LHC 12/14/2009: normal coronaries. b.) R/LHC 12/11/2014: normal coronaries.   Hyperlipidemia    Hypertension    Hypoxemia    LBBB (left bundle branch block)    NICM (nonischemic cardiomyopathy; dilated cardiomyopathy) (HCC)    a.) R/LHC 12/14/2009: normal cors; LVEDP 18 mmHg, mean PA 29 mmHg, mean PCWP 31 mmHg; CO 6 L/min; CI 2.54 L/min/m. b.) TTE 12/10/2014: EF 25%. c.) R/LHC 12/11/2014: mean RA 9 mmHg, mean PA 22 mmHg, mean PCWP 22 mmHg. d.) TTE 06/23/2018: EF 20-25%. e.) TTE 03/01/2019: EF 15%. f.) TTE 02/27/2020: < 20%. g.) TTE 11/23/2020: EF <20%. h.) TTE 05/14/2021: < 15%.   NSTEMI (non-ST  elevated myocardial infarction) (HCC)    a.) x 3 per patient report ---> 2012, 2014, 2016   Obesity    On supplemental oxygen by nasal cannula    a.) 2-3 L/Avis PRN   OSA on CPAP    PAF (paroxysmal atrial fibrillation) (HCC)    a.) CHA2DS2-VASc = 4 (HFrEF, HTN, prior MI, T2DM). b.) rate/rhythm maintained without pharmacologial intervention; no current anticoagulation.   Pancreatitis    PSVT (paroxysmal supraventricular tachycardia)     Tobacco Use: Social History   Tobacco Use  Smoking Status Former   Current packs/day: 0.00   Average packs/day: 1.5 packs/day for 34.0 years (51.0 ttl pk-yrs)   Types: Cigarettes   Quit date: 05/2023   Years since quitting: 1.0   Passive exposure: Past  Smokeless Tobacco Never  Tobacco Comments   Quit smoking 05/2023    Labs: Review Flowsheet  More data exists      Latest Ref Rng & Units 06/13/2023 12/03/2023 12/04/2023 02/04/2024 02/24/2024  Labs for ITP Cardiac and Pulmonary Rehab  Cholestrol 0 - 200 mg/dL - - - 743  787   LDL (calc) 0 - 99 mg/dL - - - 805  842   HDL-C >40 mg/dL - - - 41  38   Trlycerides <150 mg/dL - - - 896  87   Hemoglobin A1c 4.8 - 5.6 % 7.3  8.5  - - -  Bicarbonate 20.0 - 28.0 mmol/L - - 29.2  - -  O2 Saturation % - - 89.3  - -     Exercise Target Goals: Exercise Program Goal: Individual exercise prescription set using results from initial 6 min walk test and THRR while considering  patient's activity barriers and safety.   Exercise Prescription Goal: Initial exercise prescription builds to 30-45 minutes a day of aerobic activity, 2-3 days per week.  Home exercise guidelines will be given to patient during program as part of exercise prescription that the participant will acknowledge.   Education: Aerobic Exercise: - Group verbal and visual presentation on the components of exercise prescription. Introduces F.I.T.T principle from ACSM for exercise prescriptions.  Reviews F.I.T.T. principles of aerobic exercise  including progression. Written material provided at class time. Flowsheet Row Cardiac Rehab from 04/06/2024 in [facility] Cardiac and Pulmonary Rehab  Education need identified 01/26/24  Date 04/06/24  Educator nt  Instruction Review Code 1- [employer] Understanding    Education: Resistance Exercise: - Group verbal and visual presentation on the components of exercise prescription. Introduces F.I.T.T principle from ACSM for exercise prescriptions  Reviews F.I.T.T. principles of resistance exercise including progression. Written material provided at class time. Flowsheet Row Cardiac Rehab from 04/06/2024 in [facility] Cardiac and Pulmonary Rehab  Education need identified 01/26/24  Education: Exercise & Equipment Safety: - Individual verbal instruction and demonstration of equipment use and safety with use of the equipment. Flowsheet Row Cardiac Rehab from 04/06/2024 in [facility] Cardiac and Pulmonary Rehab  Date 01/26/24  Educator MB  Instruction Review Code 1- Verbalizes Understanding    Education: Exercise Physiology & General Exercise Guidelines: - Group verbal and written instruction with models to review the exercise physiology of the cardiovascular system and associated critical values. Provides general exercise guidelines with specific guidelines to those with heart or lung disease. Written material provided at class time. Flowsheet Row Cardiac Rehab from 04/06/2024 in [facility] Cardiac and Pulmonary Rehab  Education need identified 01/26/24    Education: Flexibility, Balance, Mind/Body Relaxation: - Group verbal and visual presentation with interactive activity on the components of exercise prescription. Introduces F.I.T.T principle from ACSM for exercise prescriptions. Reviews F.I.T.T. principles of flexibility and balance exercise training including progression. Also discusses the mind body connection.  Reviews various relaxation techniques to help reduce and manage stress (i.e. Deep breathing,  progressive muscle relaxation, and visualization). Balance handout provided to take home. Written material provided at class time.   Activity Barriers & Risk Stratification:  Activity Barriers & Cardiac Risk Stratification - 01/26/24 1028       Activity Barriers & Cardiac Risk Stratification   Activity Barriers Joint Problems;Other (comment)    Comments Elbow joint, gout in both feet    Cardiac Risk Stratification High          6 Minute Walk:  6 Minute Walk     Row Name 01/26/24 1027         6 Minute Walk   Phase Initial     Distance 800 feet     Walk Time 6 minutes     # of Rest Breaks 0     MPH 1.52     METS 2.06     RPE 11     Perceived Dyspnea  1     VO2 Peak 7.21     Symptoms No     Resting HR 60 bpm     Resting BP 104/70     Resting Oxygen Saturation  98 %     Exercise Oxygen Saturation  during 6 min walk 97 %     Max Ex. HR 100 bpm     Max Ex. BP 118/80     2 Minute Post BP 100/70        Oxygen Initial Assessment:  Oxygen Initial Assessment - 01/20/24 1430       Home Oxygen   Home Oxygen Device Home Concentrator;E-Tanks    Sleep Oxygen Prescription Continuous;CPAP    Liters per minute 3    Home Exercise Oxygen Prescription Continuous    Liters per minute 3   when winded, 2 l when not winded   Home Resting Oxygen Prescription Continuous    Liters per minute 2    Compliance with Home Oxygen Use Yes      Intervention   Short Term Goals To learn and exhibit compliance with exercise, home and travel O2 prescription;To learn and understand importance of monitoring SPO2 with pulse oximeter and demonstrate accurate use of the pulse oximeter.;To learn and understand importance of maintaining oxygen saturations>88%;To learn and demonstrate proper pursed lip breathing techniques or other breathing techniques. ;To learn and demonstrate proper use of respiratory medications    Long  Term Goals Exhibits compliance with exercise, home  and travel O2  prescription;Verbalizes importance of monitoring SPO2 with  pulse oximeter and return demonstration;Maintenance of O2 saturations>88%;Exhibits proper breathing techniques, such as pursed lip breathing or other method taught during program session;Compliance with respiratory medication;Demonstrates proper use of MDI's          Oxygen Re-Evaluation:  Oxygen Re-Evaluation     Row Name 02/01/24 1014             Home Oxygen   Home Oxygen Device Home Concentrator;E-Tanks       Sleep Oxygen Prescription Continuous;CPAP       Liters per minute 3       Home Exercise Oxygen Prescription Continuous       Liters per minute 3       Home Resting Oxygen Prescription Continuous       Liters per minute 2       Compliance with Home Oxygen Use Yes         Goals/Expected Outcomes   Short Term Goals To learn and exhibit compliance with exercise, home and travel O2 prescription;To learn and understand importance of monitoring SPO2 with pulse oximeter and demonstrate accurate use of the pulse oximeter.;To learn and understand importance of maintaining oxygen saturations>88%;To learn and demonstrate proper pursed lip breathing techniques or other breathing techniques. ;To learn and demonstrate proper use of respiratory medications       Long  Term Goals Exhibits compliance with exercise, home  and travel O2 prescription;Verbalizes importance of monitoring SPO2 with pulse oximeter and return demonstration;Maintenance of O2 saturations>88%;Exhibits proper breathing techniques, such as pursed lip breathing or other method taught during program session;Compliance with respiratory medication;Demonstrates proper use of MDI's       Comments Reviewed RPE and dyspnea scale, THR and program prescription with pt today.  Pt voiced understanding and was given a copy of goals to take home.       Goals/Expected Outcomes Short: Use RPE daily to regulate intensity. Long: Follow program prescription in THR.          Oxygen  Discharge (Final Oxygen Re-Evaluation):  Oxygen Re-Evaluation - 02/01/24 1014       Home Oxygen   Home Oxygen Device Home Concentrator;E-Tanks    Sleep Oxygen Prescription Continuous;CPAP    Liters per minute 3    Home Exercise Oxygen Prescription Continuous    Liters per minute 3    Home Resting Oxygen Prescription Continuous    Liters per minute 2    Compliance with Home Oxygen Use Yes      Goals/Expected Outcomes   Short Term Goals To learn and exhibit compliance with exercise, home and travel O2 prescription;To learn and understand importance of monitoring SPO2 with pulse oximeter and demonstrate accurate use of the pulse oximeter.;To learn and understand importance of maintaining oxygen saturations>88%;To learn and demonstrate proper pursed lip breathing techniques or other breathing techniques. ;To learn and demonstrate proper use of respiratory medications    Long  Term Goals Exhibits compliance with exercise, home  and travel O2 prescription;Verbalizes importance of monitoring SPO2 with pulse oximeter and return demonstration;Maintenance of O2 saturations>88%;Exhibits proper breathing techniques, such as pursed lip breathing or other method taught during program session;Compliance with respiratory medication;Demonstrates proper use of MDI's    Comments Reviewed RPE and dyspnea scale, THR and program prescription with pt today.  Pt voiced understanding and was given a copy of goals to take home.    Goals/Expected Outcomes Short: Use RPE daily to regulate intensity. Long: Follow program prescription in THR.          Initial  Exercise Prescription:  Initial Exercise Prescription - 01/26/24 1000       Date of Initial Exercise RX and Referring Provider   Date 01/26/24    Referring Provider Florencio Kava, MD      Oxygen   Oxygen Continuous    Liters 2-3L    Maintain Oxygen Saturation 88% or higher      Treadmill   MPH 1.4    Grade 0    Minutes 15    METs 2.07       Recumbant Bike   Level 2    RPM 50    Watts 25    Minutes 15    METs 2.06      NuStep   Level 2    SPM 80    Minutes 15    METs 2.06      T5 Nustep   Level 2    SPM 80    Minutes 15    METs 2.06      Biostep-RELP   Level 2    SPM 50    Minutes 15    METs 2.06      Prescription Details   Frequency (times per week) 2    Duration Progress to 30 minutes of continuous aerobic without signs/symptoms of physical distress      Intensity   THRR 40-80% of Max Heartrate 104-149    Ratings of Perceived Exertion 11-13    Perceived Dyspnea 0-4      Progression   Progression Continue to progress workloads to maintain intensity without signs/symptoms of physical distress.      Resistance Training   Training Prescription Yes    Weight 3 lb, 5 lb   3 lb (R), 5 lb (L)   Reps 10-15          Perform Capillary Blood Glucose checks as needed.  Exercise Prescription Changes:   Exercise Prescription Changes     Row Name 01/26/24 1000 02/18/24 1600 03/15/24 1100 03/29/24 0800 04/06/24 0900     Response to Exercise   Blood Pressure (Admit) 104/70 108/72 136/70 100/62 --   Blood Pressure (Exercise) 118/80 128/80 118/70 -- --   Blood Pressure (Exit) 100/70 118/74 118/62 90/68 --   Heart Rate (Admit) 60 bpm 81 bpm 70 bpm 55 bpm --   Heart Rate (Exercise) 100 bpm 105 bpm 107 bpm 94 bpm --   Heart Rate (Exit) 60 bpm 75 bpm 86 bpm 81 bpm --   Oxygen Saturation (Admit) 98 % -- -- -- --   Oxygen Saturation (Exercise) 97 % -- -- -- --   Oxygen Saturation (Exit) 97 % -- -- -- --   Rating of Perceived Exertion (Exercise) 11 11 14 12  --   Perceived Dyspnea (Exercise) 1 1 0 -- --   Symptoms none none none none --   Comments results first 2 weeks of exercise -- -- --   Duration -- Progress to 30 minutes of  aerobic without signs/symptoms of physical distress Progress to 30 minutes of  aerobic without signs/symptoms of physical distress Progress to 30 minutes of  aerobic without  signs/symptoms of physical distress --   Intensity THRR New THRR unchanged THRR unchanged THRR unchanged --     Progression   Progression -- Continue to progress workloads to maintain intensity without signs/symptoms of physical distress. Continue to progress workloads to maintain intensity without signs/symptoms of physical distress. Continue to progress workloads to maintain intensity without signs/symptoms of physical distress. --  Average METs 2.06 5.46 1.97 2 --     Resistance Training   Training Prescription -- Yes Yes Yes --   Weight -- 3 lb, 5 lb  3 lb (R), 5 lb (L) 3 lb, 5 lb  3 lb (R), 5 lb (L) 0  Did not complete any resistance training since last review --   Reps -- 10-15 10-15 10-15 --     Interval Training   Interval Training -- No No No --     Oxygen   Oxygen -- Continuous Continuous Continuous --   Liters -- 3L 3L 3L --     Treadmill   MPH -- 1 1.3 1.3 --   Grade -- 0 0 0 --   Minutes -- 15 15 15  --   METs -- 1.77 1.99 1.99 --     Recumbant Bike   Level -- -- 3 -- --   Watts -- -- 25 -- --   Minutes -- -- 15 -- --   METs -- -- 2.56 -- --     T5 Nustep   Level -- 2 3 -- --   Minutes -- 15 15 -- --   METs -- 2 2 -- --     Biostep-RELP   Level -- -- -- 1 --   Minutes -- -- -- 15 --   METs -- -- -- 2 --     Home Exercise Plan   Plans to continue exercise at -- -- -- -- Home (comment)  Treadmill at home, looking into getting dumbbells   Frequency -- -- -- -- Add 3 additional days to program exercise sessions.   Initial Home Exercises Provided -- -- -- -- 04/06/24     Oxygen   Maintain Oxygen Saturation -- 88% or higher 88% or higher 88% or higher --    Row Name 04/14/24 0900             Response to Exercise   Blood Pressure (Admit) 108/62       Blood Pressure (Exit) 118/64       Heart Rate (Admit) 89 bpm       Heart Rate (Exercise) 101 bpm       Heart Rate (Exit) 84 bpm       Rating of Perceived Exertion (Exercise) 15       Symptoms none        Duration Progress to 30 minutes of  aerobic without signs/symptoms of physical distress       Intensity THRR unchanged         Progression   Progression Continue to progress workloads to maintain intensity without signs/symptoms of physical distress.       Average METs 1.84         Resistance Training   Training Prescription Yes       Weight 0  Did not complete any resistance training since last review       Reps 10-15         Interval Training   Interval Training No         Oxygen   Oxygen Continuous       Liters 3L         Treadmill   MPH 1.1       Grade 0       Minutes 15       METs 1.84         T5 Nustep   Level 1  Minutes 15         Home Exercise Plan   Plans to continue exercise at Home (comment)  Treadmill at home, looking into getting dumbbells       Frequency Add 3 additional days to program exercise sessions.       Initial Home Exercises Provided 04/06/24         Oxygen   Maintain Oxygen Saturation 88% or higher          Exercise Comments:   Exercise Comments     Row Name 02/01/24 1012 03/21/24 0944         Exercise Comments First full day of exercise!  Patient was oriented to gym and equipment including functions, settings, policies, and procedures.  Patient's individual exercise prescription and treatment plan were reviewed.  All starting workloads were established based on the results of the 6 minute walk test done at initial orientation visit.  The plan for exercise progression was also introduced and progression will be customized based on patient's performance and goals. Pt able to follow exercise prescription today. Pt stated he wasn't feeling well due to extra fluid he has and is leaving  a little early to go home and take his fluid pill.  Will continue to monitor for progression.         Exercise Goals and Review:   Exercise Goals     Row Name 01/26/24 1036             Exercise Goals   Increase Physical Activity Yes        Intervention Provide advice, education, support and counseling about physical activity/exercise needs.;Develop an individualized exercise prescription for aerobic and resistive training based on initial evaluation findings, risk stratification, comorbidities and participant's personal goals.       Expected Outcomes Short Term: Attend rehab on a regular basis to increase amount of physical activity.;Long Term: Add in home exercise to make exercise part of routine and to increase amount of physical activity.;Long Term: Exercising regularly at least 3-5 days a week.       Increase Strength and Stamina Yes       Intervention Provide advice, education, support and counseling about physical activity/exercise needs.;Develop an individualized exercise prescription for aerobic and resistive training based on initial evaluation findings, risk stratification, comorbidities and participant's personal goals.       Expected Outcomes Short Term: Increase workloads from initial exercise prescription for resistance, speed, and METs.;Short Term: Perform resistance training exercises routinely during rehab and add in resistance training at home;Long Term: Improve cardiorespiratory fitness, muscular endurance and strength as measured by increased METs and functional capacity ( )       Able to understand and use rate of perceived exertion (RPE) scale Yes       Intervention Provide education and explanation on how to use RPE scale       Expected Outcomes Short Term: Able to use RPE daily in rehab to express subjective intensity level;Long Term:  Able to use RPE to guide intensity level when exercising independently       Able to understand and use Dyspnea scale Yes       Intervention Provide education and explanation on how to use Dyspnea scale       Expected Outcomes Short Term: Able to use Dyspnea scale daily in rehab to express subjective sense of shortness of breath during exertion;Long Term: Able to use Dyspnea scale to  guide intensity level when exercising independently  Knowledge and understanding of Target Heart Rate Range (THRR) Yes       Intervention Provide education and explanation of THRR including how the numbers were predicted and where they are located for reference       Expected Outcomes Short Term: Able to state/look up THRR;Short Term: Able to use daily as guideline for intensity in rehab;Long Term: Able to use THRR to govern intensity when exercising independently       Able to check pulse independently Yes       Intervention Provide education and demonstration on how to check pulse in carotid and radial arteries.;Review the importance of being able to check your own pulse for safety during independent exercise       Expected Outcomes Short Term: Able to explain why pulse checking is important during independent exercise;Long Term: Able to check pulse independently and accurately       Understanding of Exercise Prescription Yes       Intervention Provide education, explanation, and written materials on patient's individual exercise prescription       Expected Outcomes Short Term: Able to explain program exercise prescription;Long Term: Able to explain home exercise prescription to exercise independently          Exercise Goals Re-Evaluation :  Exercise Goals Re-Evaluation     Row Name 02/18/24 1629 03/01/24 1337 03/15/24 1103 03/29/24 0830 04/06/24 0947     Exercise Goal Re-Evaluation   Exercise Goals Review Increase Physical Activity;Increase Strength and Stamina;Understanding of Exercise Prescription Increase Physical Activity;Increase Strength and Stamina;Understanding of Exercise Prescription Increase Physical Activity;Increase Strength and Stamina;Understanding of Exercise Prescription Increase Physical Activity;Increase Strength and Stamina;Understanding of Exercise Prescription Increase Physical Activity;Increase Strength and Stamina;Understanding of Exercise Prescription;Able to  understand and use Dyspnea scale;Knowledge and understanding of Target Heart Rate Range (THRR);Able to understand and use rate of perceived exertion (RPE) scale;Able to check pulse independently   Comments Taige is off to a good start in the program. He was able to attend his first 2 sessions during this review period. During these sessions he was able to use the treadmill at 1 mph and no incline, and the T5 nustep at level 2. We will continue to monitor his progress in the program. Kunta has not attended rehab since the last review. We will contact him to see when he plans to return to regular attendance in the program. We will continue to monitor his progress when he returns to rehab. Carvell is doing well in rehab. He has been able to increase his speed on the treadmill from 1 to 1.58mph. He has also been able to increase to level 3 on both the T5 nustep and recumbent bike. We will continue to monitor his progress in the program. Vaun has only attended one session since the last review. He was able to walk the treadmill at a speed of 1.3 mph with no incline. He also worked at level 1 on the biostep, but did not do any resistance training due to him not feeling well and leaving early. We will continue to monitor his progress in the program. Reviewed home exercise with pt today.  Pt plans to use his treadmill at home for aerobic exercise and looking into getting dumbbells for resistance for exercise. He plans to add 3-5 additional days of exercise at home. Reviewed THR, pulse, RPE, sign and symptoms, pulse oximetery and when to call 911 or MD.  Also discussed weather considerations and indoor options.  Pt voiced understanding.   Expected  Outcomes Short: Continue to follow exercise prescription. Long: Continue exercise to improve strength and stamina. Short: Return to rehab when appropriate. Long: Graduate from the program. Short: Continue to increase treadmill workload. Long: Continue exercise to improve strength  and stamina. Short: Attend rehab more consistently. Long: Continue exercise to improve strength and stamina. Short: Add 3 additional days of exercise at home. Long: Continue to exercise at home independently.    Row Name 04/14/24 0945 04/28/24 0806 05/12/24 0753 05/24/24 1509       Exercise Goal Re-Evaluation   Exercise Goals Review Increase Physical Activity;Increase Strength and Stamina;Understanding of Exercise Prescription Increase Physical Activity;Increase Strength and Stamina;Understanding of Exercise Prescription -- Increase Physical Activity;Increase Strength and Stamina;Understanding of Exercise Prescription    Comments Arul has only attended one session since the last review. During his one session his treadmill speed decreased from 1.3 mph to 1.1 mph with no incline. He also decreased to level 1 on the T5 nustep. We will encourage him to attend rehab more consistently and will continue to monitor his progress in the program. Pascal has not attended rehab since the last review. He last attended on 08/27 and has been on medical hold due to issues with fluid retention. We will continue to monitor his progress when he returns to the program. Abdul continues to be on medical hold due to issues with fluid retention. He last attended rehab on 08/27. We will continue to monitor his progress when he returns to the program. Reshawn continues to be on medical hold. He last attended rehab on 08/27. We will call him to see when he plans to return. We will continue to monitor his progress when he returns to the program.    Expected Outcomes -- Short: Return to rehab when appropriate. Long: Continue to exercise at home independently. Short: Return to rehab when appropriate. Long: Continue to exercise at home independently. Short: Return to rehab when appropriate. Long: Continue to exercise at home independently.       Discharge Exercise Prescription (Final Exercise Prescription Changes):  Exercise  Prescription Changes - 04/14/24 0900       Response to Exercise   Blood Pressure (Admit) 108/62    Blood Pressure (Exit) 118/64    Heart Rate (Admit) 89 bpm    Heart Rate (Exercise) 101 bpm    Heart Rate (Exit) 84 bpm    Rating of Perceived Exertion (Exercise) 15    Symptoms none    Duration Progress to 30 minutes of  aerobic without signs/symptoms of physical distress    Intensity THRR unchanged      Progression   Progression Continue to progress workloads to maintain intensity without signs/symptoms of physical distress.    Average METs 1.84      Resistance Training   Training Prescription Yes    Weight 0   Did not complete any resistance training since last review   Reps 10-15      Interval Training   Interval Training No      Oxygen   Oxygen Continuous    Liters 3L      Treadmill   MPH 1.1    Grade 0    Minutes 15    METs 1.84      T5 Nustep   Level 1    Minutes 15      Home Exercise Plan   Plans to continue exercise at Home (comment)   Treadmill at home, looking into getting dumbbells   Frequency Add 3 additional  days to program exercise sessions.    Initial Home Exercises Provided 04/06/24      Oxygen   Maintain Oxygen Saturation 88% or higher          Nutrition:  Target Goals: Understanding of nutrition guidelines, daily intake of sodium 1500mg , cholesterol 200mg , calories 30% from fat and 7% or less from saturated fats, daily to have 5 or more servings of fruits and vegetables.  Education: Nutrition 1 -Group instruction provided by verbal, written material, interactive activities, discussions, models, and posters to present general guidelines for heart healthy nutrition including macronutrients, label reading, and promoting whole foods over processed counterparts. Education serves as Pensions consultant of discussion of heart healthy eating for all. Written material provided at class time.    Education: Nutrition 2 -Group instruction provided by verbal,  written material, interactive activities, discussions, models, and posters to present general guidelines for heart healthy nutrition including sodium, cholesterol, and saturated fat. Providing guidance of habit forming to improve blood pressure, cholesterol, and body weight. Written material provided at class time.     Biometrics:  Pre Biometrics - 01/26/24 1036       Pre Biometrics   Height 5' 9.1 (1.755 m)    Weight 308 lb 4.8 oz (139.8 kg)    Waist Circumference 58 inches    Hip Circumference 55 inches    Waist to Hip Ratio 1.05 %    BMI (Calculated) 45.4    Single Leg Stand 7.9 seconds           Nutrition Therapy Plan and Nutrition Goals:  Nutrition Therapy & Goals - 02/01/24 1340       Nutrition Therapy   Diet Carb controlled, Cardiac, Low Na    Protein (specify units) 90g    Fiber 30 grams    Whole Grain Foods 3 servings    Saturated Fats 15 max. grams    Fruits and Vegetables 5 servings/day    Sodium 2 grams      Personal Nutrition Goals   Nutrition Goal Cut out sugary beverages, drink 32-48oz water daily    Personal Goal #2 Eat 15-30gProtein and 30-60gCarbs at each meal.    Personal Goal #3 Read labels and reduce sodium intake to below 2300mg . Ideally 1500mg  per day.    Comments Patient drinking mostly sugary beverages like juice and soda. Says he has tried to move away from soda, reports he was drinking 4-6L of soda a day. Explained to him the negative impact sugary beverages have on his health, blood sugar and weight. Set goal to switch over and drink more water, with short term goal of ~32-48oz of water daily with 64-80oz as a long term goal. Educated on carb portions of ~30-60g per meal, the importance of eating throughout the day and choosing high quality carbs rather than junk food and sugary beverages. Provided him with Mediterranean diet handout. Educated on types of fats, sources, and how to read labels. He reports he adds salt to his foods, has been told to  limit and look for better alternatives like misses dash. Confirmed and encouraged him to not salt food. Reinforced focus on keeping carb portions controlled at meals and limiting sodium.      Intervention Plan   Intervention Prescribe, educate and counsel regarding individualized specific dietary modifications aiming towards targeted core components such as weight, hypertension, lipid management, diabetes, heart failure and other comorbidities.;Nutrition handout(s) given to patient.    Expected Outcomes Short Term Goal: Understand basic principles of dietary content,  such as calories, fat, sodium, cholesterol and nutrients.;Short Term Goal: A plan has been developed with personal nutrition goals set during dietitian appointment.;Long Term Goal: Adherence to prescribed nutrition plan.          Nutrition Assessments:  MEDIFICTS Score Key: >=70 Need to make dietary changes  40-70 Heart Healthy Diet <= 40 Therapeutic Level Cholesterol Diet  Flowsheet Row Cardiac Rehab from 02/01/2024 in [facility] Cardiac [facility] Pul[facility] Rehab  Picture Your Plate Total Score on Admission 60   Picture Your Plate Scores: <59 Unhealthy dietary pattern with much room for improvement. 41-50 Dietary pattern unlikely to meet recommendations for good health and room for improvement. 51-60 More healthful dietary pattern, with some room for improvement.  >60 Healthy dietary pattern, although there may be some specific behaviors that could be improved.    Nutrition Goals Re-Evaluation:   Nutrition Goals Discharge (Final Nutrition Goals Re-Evaluation):   Psychosocial: Target Goals: Acknowledge presence or absence of significant depression and/or stress, maximize coping skills, provide positive support system. Participant is able to verbalize types and ability to use techniques and skills needed for reducing stress and depression.   Education: Stress, Anxiety, and Depression - Group verbal and visual presentation to define  topics covered.  Reviews how body is impacted by stress, anxiety, and depression.  Also discusses healthy ways to reduce stress and to treat/manage anxiety and depression. Written material provided at class time.   Education: Sleep Hygiene -Provides group verbal and written instruction about how sleep can affect your health.  Define sleep hygiene, discuss sleep cycles and impact of sleep habits. Review good sleep hygiene tips.   Initial Review & Psychosocial Screening:  Initial Psych Review & Screening - 01/20/24 1432       Initial Review   Current issues with None Identified      Family Dynamics   Good Support System? Yes   siblings,     Barriers   Psychosocial barriers to participate in program There are no identifiable barriers or psychosocial needs.      Screening Interventions   Interventions Encouraged to exercise;To provide support and resources with identified psychosocial needs;Provide feedback about the scores to participant    Expected Outcomes Short Term goal: Utilizing psychosocial counselor, staff and physician to assist with identification of specific Stressors or current issues interfering with healing process. Setting desired goal for each stressor or current issue identified.;Long Term Goal: Stressors or current issues are controlled or eliminated.;Short Term goal: Identification and review with participant of any Quality of Life or Depression concerns found by scoring the questionnaire.;Long Term goal: The participant improves quality of Life and PHQ9 Scores as seen by post scores and/or verbalization of changes          Quality of Life Scores:   Quality of Life - 02/01/24 1704       Quality of Life   Select Quality of Life      Quality of Life Scores   Health/Function Pre 18.32 %    Socioeconomic Pre 27.5 %    Psych/Spiritual Pre 30 %    Family Pre 30 %    GLOBAL Pre 23.2 %         Scores of 19 and below usually indicate a poorer quality of life in  these areas.  A difference of  2-3 points is a clinically meaningful difference.  A difference of 2-3 points in the total score of the Quality of Life Index has been associated with significant improvement in overall  quality of life, self-image, physical symptoms, and general health in studies assessing change in quality of life.  PHQ-9: Review Flowsheet  More data exists      01/26/2024 10/17/2021 06/15/2018 05/18/2018 03/05/2018  Depression screen PHQ 2/9  Decreased Interest 1 0 0 0 0  Down, Depressed, Hopeless 0 0 0 0 1  PHQ - 2 Score 1 0 0 0 1  Altered sleeping 3 - - - -  Tired, decreased energy 1 - - - -  Change in appetite 1 - - - -  Feeling bad or failure about yourself  0 - - - -  Trouble concentrating 0 - - - -  Moving slowly or fidgety/restless 1 - - - -  Suicidal thoughts 0 - - - -  PHQ-9 Score 7 - - - -  Difficult doing work/chores Somewhat difficult - - - -   Interpretation of Total Score  Total Score Depression Severity:  1-4 = Minimal depression, 5-9 = Mild depression, 10-14 = Moderate depression, 15-19 = Moderately severe depression, 20-27 = Severe depression   Psychosocial Evaluation and Intervention:  Psychosocial Evaluation - 01/20/24 1447       Psychosocial Evaluation & Interventions   Comments Hamdan has no barriers to attending the program. He lives alone. He is ready to start the program to work on exercise and learning more about amanging his heart failure.    Expected Outcomes STG attend all scheduled sessions, work on exercise progression as tolerated. Attend education sessions LTG COntinues with exercise progression, utilizes nutrition and education information to continue managing his health    Continue Psychosocial Services  Follow up required by staff          Psychosocial Re-Evaluation:   Psychosocial Discharge (Final Psychosocial Re-Evaluation):   Vocational Rehabilitation: Provide vocational rehab assistance to qualifying candidates.    Vocational Rehab Evaluation & Intervention:   Education: Education Goals: Education classes will be provided on a variety of topics geared toward better understanding of heart health and risk factor modification. Participant will state understanding/return demonstration of topics presented as noted by education test scores.  Learning Barriers/Preferences:   General Cardiac Education Topics:  AED/CPR: - Group verbal and written instruction with the use of models to demonstrate the basic use of the AED with the basic ABC's of resuscitation.   Test and Procedures: - Group verbal and visual presentation and models provide information about basic cardiac anatomy and function. Reviews the testing methods done to diagnose heart disease and the outcomes of the test results. Describes the treatment choices: Medical Management, Angioplasty, or Coronary Bypass Surgery for treating various heart conditions including Myocardial Infarction, Angina, Valve Disease, and Cardiac Arrhythmias. Written material provided at class time. Flowsheet Row Cardiac Rehab from 04/06/2024 in [facility] Cardiac and Pulmonary Rehab  Education need identified 01/26/24    Medication Safety: - Group verbal and visual instruction to review commonly prescribed medications for heart and lung disease. Reviews the medication, class of the drug, and side effects. Includes the steps to properly store meds and maintain the prescription regimen. Written material provided at class time.   Intimacy: - Group verbal instruction through game format to discuss how heart and lung disease can affect sexual intimacy. Written material provided at class time. Flowsheet Row Cardiac Rehab from 04/06/2024 in [facility] Cardiac and Pulmonary Rehab  Date 04/06/24  Educator nt  Instruction Review Code 1- Verbalizes Understanding    Know Your Numbers and Heart Failure: - Group verbal and visual instruction to discuss  disease risk factors for cardiac and  pulmonary disease and treatment options.  Reviews associated critical values for Overweight/Obesity, Hypertension, Cholesterol, and Diabetes.  Discusses basics of heart failure: signs/symptoms and treatments.  Introduces Heart Failure Zone chart for action plan for heart failure. Written material provided at class time. Flowsheet Row Cardiac Rehab from 04/06/2024 in [facility] Cardiac and Pulmonary Rehab  Education need identified 01/26/24  Date 03/02/24  Educator sb  Instruction Review Code 1- Verbalizes Understanding    Infection Prevention: - Provides verbal and written material to individual with discussion of infection control including proper hand washing and proper equipment cleaning during exercise session. Flowsheet Row Cardiac Rehab from 04/06/2024 in [facility] Cardiac and Pulmonary Rehab  Date 01/26/24  Educator MB  Instruction Review Code 1- Verbalizes Understanding    Falls Prevention: - Provides verbal and written material to individual with discussion of falls prevention and safety. Flowsheet Row Cardiac Rehab from 04/06/2024 in [facility] Cardiac and Pulmonary Rehab  Date 01/26/24  Educator MB  Instruction Review Code 1- Verbalizes Understanding    Other: -Provides group and verbal instruction on various topics (see comments)   Knowledge Questionnaire Score:  Knowledge Questionnaire Score - 01/26/24 1038       Knowledge Questionnaire Score   Pre Score 20/26          Core Components/Risk Factors/Patient Goals at Admission:  Personal Goals and Risk Factors at Admission - 01/26/24 1038       Core Components/Risk Factors/Patient Goals on Admission    Weight Management Yes;Obesity;Weight Loss    Intervention Weight Management: Develop a combined nutrition and exercise program designed to reach desired caloric intake, while maintaining appropriate intake of nutrient and fiber, sodium and fats, and appropriate energy expenditure required for the weight goal.;Weight Management: Provide  education and appropriate resources to help participant work on and attain dietary goals.;Weight Management/Obesity: Establish reasonable short term and long term weight goals.;Obesity: Provide education and appropriate resources to help participant work on and attain dietary goals.    Admit Weight 308 lb 4.8 oz (139.8 kg)    Goal Weight: Short Term 274 lb (124.3 kg)    Goal Weight: Long Term 240 lb (108.9 kg)    Expected Outcomes Short Term: Continue to assess and modify interventions until short term weight is achieved;Long Term: Adherence to nutrition and physical activity/exercise program aimed toward attainment of established weight goal;Weight Loss: Understanding of general recommendations for a balanced deficit meal plan, which promotes 1-2 lb weight loss per week and includes a negative energy balance of [phone] kcal/d;Understanding recommendations for meals to include 15-35% energy as protein, 25-35% energy from fat, 35-60% energy from carbohydrates, less than 200mg  of dietary cholesterol, 20-35 gm of total fiber daily;Understanding of distribution of calorie intake throughout the day with the consumption of 4-5 meals/snacks    Diabetes Yes    Intervention Provide education about signs/symptoms and action to take for hypo/hyperglycemia.;Provide education about proper nutrition, including hydration, and aerobic/resistive exercise prescription along with prescribed medications to achieve blood glucose in normal ranges: Fasting glucose 65-99 mg/dL    Expected Outcomes Short Term: Participant verbalizes understanding of the signs/symptoms and immediate care of hyper/hypoglycemia, proper foot care and importance of medication, aerobic/resistive exercise and nutrition plan for blood glucose control.;Long Term: Attainment of HbA1C < 7%.    Heart Failure Yes    Intervention Provide a combined exercise and nutrition program that is supplemented with education, support and counseling about heart failure.  Directed toward relieving symptoms such as  shortness of breath, decreased exercise tolerance, and extremity edema.    Expected Outcomes Improve functional capacity of life;Short term: Attendance in program 2-3 days a week with increased exercise capacity. Reported lower sodium intake. Reported increased fruit and vegetable intake. Reports medication compliance.;Short term: Daily weights obtained and reported for increase. Utilizing diuretic protocols set by physician.;Long term: Adoption of self-care skills and reduction of barriers for early signs and symptoms recognition and intervention leading to self-care maintenance.    Hypertension Yes    Intervention Provide education on lifestyle modifcations including regular physical activity/exercise, weight management, moderate sodium restriction and increased consumption of fresh fruit, vegetables, and low fat dairy, alcohol moderation, and smoking cessation.;Monitor prescription use compliance.    Expected Outcomes Short Term: Continued assessment and intervention until BP is < 140/80mm HG in hypertensive participants. < 130/71mm HG in hypertensive participants with diabetes, heart failure or chronic kidney disease.;Long Term: Maintenance of blood pressure at goal levels.    Lipids Yes    Intervention Provide education and support for participant on nutrition & aerobic/resistive exercise along with prescribed medications to achieve LDL 70mg , HDL >40mg .    Expected Outcomes Short Term: Participant states understanding of desired cholesterol values and is compliant with medications prescribed. Participant is following exercise prescription and nutrition guidelines.;Long Term: Cholesterol controlled with medications as prescribed, with individualized exercise RX and with personalized nutrition plan. Value goals: LDL < 70mg , HDL > 40 mg.          Education:Diabetes - Individual verbal and written instruction to review signs/symptoms of diabetes, desired  ranges of glucose level fasting, after meals and with exercise. Acknowledge that pre and post exercise glucose checks will be done for 3 sessions at entry of program.   Core Components/Risk Factors/Patient Goals Review:    Core Components/Risk Factors/Patient Goals at Discharge (Final Review):    ITP Comments:  ITP Comments     Row Name 01/20/24 1443 01/26/24 1027 02/01/24 1012 02/03/24 1041 03/02/24 1036   ITP Comments Virtual orientation call completed today. he has an appointment on Date: 01/26/2024  for EP eval and gym Orientation.  Documentation of diagnosis can be found in [facility] 12/16/2023 . Completed and gym orientation for cardiac rehab. Initial ITP created and sent for review to Dr. Oneil Pinal, Medical Director. First full day of exercise!  Patient was oriented to gym and equipment including functions, settings, policies, and procedures.  Patient's individual exercise prescription and treatment plan were reviewed.  All starting workloads were established based on the results of the 6 minute walk test done at initial orientation visit.  The plan for exercise progression was also introduced and progression will be customized based on patient's performance and goals. 30 Day review completed. Medical Director ITP review done, changes made as directed, and signed approval by Medical Director.    new to program 30 Day review completed. Medical Director ITP review done, changes made as directed, and signed approval by Medical Director.    Row Name 03/21/24 [phone] 03/30/24 0958 04/04/24 1112 04/18/24 0940 04/27/24 1007   ITP Comments Pt able to follow exercise prescription today. Pt stated he wasn't feeling well due to extra fluid he has and is leaving  a little early to go home and take his fluid pill.  Will continue to monitor for progression. 30 Day review completed. Medical Director ITP review done, changes made as directed, and signed approval by Medical Director. Patient called to inform  staff that he would be out for 2  weeks as he works with the [facility] to manage his fluid retention. Patient is still having fluid issues and is following with the heart failure clinic. He wishes to be on hold until Sept 15th. 30 Day review completed. Medical Director ITP review done; changes made as directed and signed approval by Medical Director.    Row Name 05/25/24 1453           ITP Comments 30 Day review completed. Medical Director ITP review done; changes made as directed and signed approval by Medical Director. On medical hold.          Comments: 30 day review

## 2024-05-27 ENCOUNTER — Encounter: Payer: Self-pay | Admitting: Family

## 2024-05-27 ENCOUNTER — Ambulatory Visit: Payer: Self-pay | Admitting: Family

## 2024-05-27 ENCOUNTER — Ambulatory Visit (HOSPITAL_BASED_OUTPATIENT_CLINIC_OR_DEPARTMENT_OTHER): Admitting: Family

## 2024-05-27 ENCOUNTER — Other Ambulatory Visit
Admission: RE | Admit: 2024-05-27 | Discharge: 2024-05-27 | Disposition: A | Discharge status: Home / Self Care | Source: Ambulatory Visit | Attending: Family | Admitting: Family

## 2024-05-27 VITALS — BP 94/72 | HR 82 | Wt 310.0 lb

## 2024-05-27 DIAGNOSIS — I5020 Unspecified systolic (congestive) heart failure: Secondary | ICD-10-CM | POA: Diagnosis not present

## 2024-05-27 DIAGNOSIS — G4733 Obstructive sleep apnea (adult) (pediatric): Secondary | ICD-10-CM

## 2024-05-27 DIAGNOSIS — E782 Mixed hyperlipidemia: Secondary | ICD-10-CM

## 2024-05-27 DIAGNOSIS — I480 Paroxysmal atrial fibrillation: Secondary | ICD-10-CM

## 2024-05-27 DIAGNOSIS — I10 Essential (primary) hypertension: Secondary | ICD-10-CM

## 2024-05-27 DIAGNOSIS — Z794 Long term (current) use of insulin: Secondary | ICD-10-CM

## 2024-05-27 DIAGNOSIS — I472 Ventricular tachycardia, unspecified: Secondary | ICD-10-CM

## 2024-05-27 DIAGNOSIS — E119 Type 2 diabetes mellitus without complications: Secondary | ICD-10-CM

## 2024-05-27 LAB — BASIC METABOLIC PANEL WITH GFR
Anion gap: 12 (ref 5–15)
BUN: 41 mg/dL — ABNORMAL HIGH (ref 6–20)
CO2: 26 mmol/L (ref 22–32)
Calcium: 8.7 mg/dL — ABNORMAL LOW (ref 8.9–10.3)
Chloride: 99 mmol/L (ref 98–111)
Creatinine, Ser: 1.63 mg/dL — ABNORMAL HIGH (ref 0.61–1.24)
GFR, Estimated: 52 mL/min — ABNORMAL LOW
Glucose, Bld: 214 mg/dL — ABNORMAL HIGH (ref 70–99)
Potassium: 4.3 mmol/L (ref 3.5–5.1)
Sodium: 137 mmol/L (ref 135–145)

## 2024-05-27 LAB — BRAIN NATRIURETIC PEPTIDE: B Natriuretic Peptide: 370.7 pg/mL — ABNORMAL HIGH (ref 0.0–100.0)

## 2024-05-27 MED ORDER — METOLAZONE 2.5 MG PO TABS
2.5000 mg | ORAL_TABLET | Freq: Every day | ORAL | 0 refills | Status: DC
Start: 2024-05-27 — End: 2024-06-13

## 2024-05-27 NOTE — Patient Instructions (Signed)
 Medication Changes:  Take Metolazone  2.5 MG once daily for 2 days.  Take Potasium 40 MEQ  once daily for 2 days with the Metolazone .   Lab Work:  Go over to the [facility]. Go pass the gift shop and have your blood work completed.  We will only call you if the results are abnormal or if the provider would like to make medication changes.  No news is good news.    Special Instructions // Education:  It was good to see you today!  If you receive a satisfaction survey regarding the [facility], please take the time to fill it out. This way we can continue to provide excellent care and make any changes that need to be made.    Follow-Up in: 1 week with Ellouise Class, FNP.   Thank you for choosing [facility].    At the [facility], you and your health needs are our priority. We have a designated team specialized in the treatment of Heart Failure. This Care Team includes your primary Heart Failure Specialized Cardiologist (physician), Advanced Practice Providers (APPs- Physician Assistants and Nurse Practitioners), and Pharmacist who all work together to provide you with the care you need, when you need it.   You may see any of the following providers on your designated Care Team at your next follow up:  Dr. Toribio Fuel Dr. Ezra Shuck Dr. Ria Commander Dr. Morene Brownie Ellouise Class, FNP Jaun Bash, RPH-CPP  Please be sure to bring in all your medications bottles to every appointment.   Need to Contact Us :  If you have any questions or concerns before your next appointment please send us  a message through [place] or call our office at [phone].    TO LEAVE A MESSAGE FOR THE NURSE SELECT OPTION 2, PLEASE LEAVE A MESSAGE INCLUDING: YOUR NAME DATE OF BIRTH CALL BACK NUMBER REASON FOR CALL**this is important as we prioritize the call backs  YOU WILL RECEIVE A CALL BACK THE SAME DAY AS LONG AS  YOU CALL BEFORE 4:00 PM

## 2024-05-27 NOTE — Progress Notes (Signed)
 [facility] Note    PCP: Antone Bottoms, MD  Primary Cardiologist: Florencio Kava, MD / Elodie Ronal Maxwell, NP (last seen 06/25; returns 09/25) HF provider: Bulah Cornet, MD (last seen 05/24)  Chief Complaint: shortness of breath   HPI:  [name] is a 48 y/o male with a history of obstructive sleep apnea (currently with CPAP), MI, HTN, VT (successful shock X 3), CKD, COPD, PAF, gout, hyperlipidemia, GERD, IDDM, SVT,asthma,  hidradenitis suppurative, chronic LBBB and chronic heart failure with CRT-D (2016).  Stress test 03/01/2019: Abnormal myocardial perfusion scan no clear evidence of reversible ischemia inferior persistent defect either scar versus body habitus artifact severely dilated left ventricular function severely depressed LV function with ejection fraction of around 11%. Recommend medical therapy   Echo 03/01/2019: EF of 15% along with mild MR.  Echo 02/27/20: EF of <20% Echo 11/23/20: EF of <20% along with moderate LVH and moderately elevated PA pressure. Echo 10/20/22: EF <20% along with mildly elevated PA pressure and mild MR.   Was in the ED 04/27/23 due to groin abscess X 1 month with drainage. CT done with f/u to be done with surgeon.   Admitted 06/12/23 due to ICD firing after sudden onset of SOB, chest tightness and dizziness. He was able to stand up and make it to the bathroom when he felt his ICD firing again. Afterwards, his shortness of breath, chest pain and dizziness resolved. He called EMS and on route to the ED, he was told that his ICD fired again. Successful conversion after the 3rd shock. Cardiology consulted. Needed amiodarone  drip with transition to or amiodarone .   CPET done 10/09/23 revealed sub-maximal exercise test, so maximal cardiorespiratory capacity could not be determined. Moderate to severe functional impairment appreciated. There were signs of a mixed cardiopulmonary limitation to exercise.   Was in the ED 11/07/23 with right  elbow pain. Has a history of gout. Was given colchicine  but has to wait 3 days before starting because he had been taking it daily for several weeks.  Admitted 12/01/23 with HF/ COPD exacerbation. IV diuresed. Cardiology consulted. IV solu-medrol  given with nebulizer, antibiotics. Given prednisone  taper at discharge. Troponin elevated due to demand ischemia. Echo 12/01/23: EF 30-35% with normal RV, mild MR  Was in the ED 12/03/23 with an episode of chest pain when getting in the shower. Also had lightheadedness and shortness of breath. Trponin 80 & then 130. EKG without acute ischemia. SOB improved and he was released.   Admitted 12/04/23 with worsening shortness of breath. Found to be + covid. Given IV lasix  for a/c heart failure. Given IV steroids with transition to oral prednisone  for COPD exacerbation. Had episode of chest pain, improved after giving bronchodilator. Troponin trending down not consistent with ACS.   Seen in [facility] 04/25 and had torsemide  decreased to 20mg  daily after getting BMET back.   Seen in [facility] 06/25 and sent for IV lasix . Amlodipine  and losartan  were both stopped due to hypotension.   Seen in [facility] 04/18/24 fluid up with elevated ReDs reading and worsening symptoms where torsemide  was increased to 80mg  daily and farxiga  was stopped. Midodrine  started BID as he rarely took it because he wasn't checking his BP.   Last seen in [facility] 05/24/24 where he was sent for outpatient IV lasix . After receiving IV lasix , he went to the ER with varying complaints. Chronic troponin elevation. BNP elevated but he had already received IV lasix . Viral swab was negative. UA without infection.   He  presents today for a HF follow-up visit with a chief complaint of shortness of breath (improving). Has associated fatigue, occasional chest pain, abdominal distention (improved) and tastes blood in his mouth after he coughs. Able to produce brown sputum, no bright red blood in sputum. Received 80mg  IV lasix  3 days ago  and says that he feels a little bit better.   He is currently using continuous O2 on 3L daily with symptom relief but endorses if it is removed he will be short of breath.   Patint has an appt November 12th to have ICD wires changed.   ROS: All systems negative except as listed in HPI, PMH and Problem List.  SH:  Social History   Socioeconomic History   Marital status: Single    Spouse name: Not on file   Number of children: 0   Years of education: Not on file   Highest education level: Some college, no degree  Occupational History   Occupation: unemployed  Tobacco Use   Smoking status: Former    Current packs/day: 0.00    Average packs/day: 1.5 packs/day for 34.0 years (51.0 ttl pk-yrs)    Types: Cigarettes    Quit date: 05/2023    Years since quitting: 1.0    Passive exposure: Past   Smokeless tobacco: Never   Tobacco comments:    Quit smoking 05/2023  Vaping Use   Vaping status: Never Used  Substance and Sexual Activity   Alcohol use: No   Drug use: No   Sexual activity: Yes  Other Topics Concern   Not on file  Social History Narrative   Live alone   Social Drivers of Health   Financial Resource Strain: Medium Risk (12/02/2023)   Overall Financial Resource Strain (CARDIA)    Difficulty of Paying Living Expenses: Somewhat hard  Food Insecurity: No Food Insecurity (12/04/2023)   Hunger Vital Sign    Worried About Running Out of Food in the Last Year: Never true    Ran Out of Food in the Last Year: Never true  Recent Concern: Food Insecurity - Food Insecurity Present (11/23/2023)   Received from [facility]   Hunger Vital Sign    Within the past 12 months, you worried that your food would run out before you got the money to buy more.: Sometimes true    Within the past 12 months, the food you bought just didn't last and you didn't have money to get more.: Sometimes true  Transportation Needs: No Transportation Needs (12/04/2023)   PRAPARE -  Administrator, Civil Service (Medical): No    Lack of Transportation (Non-Medical): No  Physical Activity: Not on file  Stress: Not on file  Social Connections: Moderately Isolated (12/04/2023)   Social Connection and Isolation Panel    Frequency of Communication with Friends and Family: More than three times a week    Frequency of Social Gatherings with Friends and Family: Once a week    Attends Religious Services: More than 4 times per year    Active Member of [employer] or Organizations: No    Attends Banker Meetings: Never    Marital Status: Never married  Intimate Partner Violence: Not At Risk (12/04/2023)   Humiliation, Afraid, Rape, and Kick questionnaire    Fear of Current or Ex-Partner: No    Emotionally Abused: No    Physically Abused: No    Sexually Abused: No    FH:  Family History  Problem Relation Age  of Onset   Hypertension Mother    Congestive Heart Failure Mother    Hypertension Sister    Diabetes Sister    Pancreatitis Sister    COPD Sister    Emphysema Sister        smoked   Pancreatitis Brother    Anemia Neg Hx    Arrhythmia Neg Hx    Asthma Neg Hx    Clotting disorder Neg Hx    Fainting Neg Hx    Heart attack Neg Hx    Heart disease Neg Hx    Heart failure Neg Hx    Hyperlipidemia Neg Hx     Past Medical History:  Diagnosis Date   AICD (automatic cardioverter/defibrillator) present    Aortic atherosclerosis    Asthma    Chronic respiratory failure (HCC)    CKD (chronic kidney disease), stage III (HCC)    COPD (chronic obstructive pulmonary disease) (HCC)    Deafness in right ear    Diabetes mellitus without complication (HCC)    Dyspnea    GERD (gastroesophageal reflux disease)    Gout    HFrEF (heart failure with reduced ejection fraction) (HCC)    a.) TTE 12/10/14: EF 25%; diff inf HK; sev LV and mod LA dil; LVH. b.) TTE 06/23/18: EF 20-25%; LVH; mild LV dil, mild BAE. c.) TTE 03/01/19: EF 15%, LVH, BAE; triv PR/TR. d.)  TTE 02/27/20: EF < 20%; sev LV dil; mild MR. e.) TTE 11/23/20: EF < 20%; glob HK; sev LV dil; LVH; mild-mod BAE; mod-sev TR, triv AR; G1DD. f.) TTE 05/14/21: EF <15%; LVH; sev LA and mild RV enlar; triv AR/PR, mild TR, mod MR.   Hiatal hernia    History of cardiac catheterization    a.) R/LHC 12/14/2009: normal coronaries. b.) R/LHC 12/11/2014: normal coronaries.   Hyperlipidemia    Hypertension    Hypoxemia    LBBB (left bundle branch block)    NICM (nonischemic cardiomyopathy; dilated cardiomyopathy) (HCC)    a.) R/LHC 12/14/2009: normal cors; LVEDP 18 mmHg, mean PA 29 mmHg, mean PCWP 31 mmHg; CO 6 L/min; CI 2.54 L/min/m. b.) TTE 12/10/2014: EF 25%. c.) R/LHC 12/11/2014: mean RA 9 mmHg, mean PA 22 mmHg, mean PCWP 22 mmHg. d.) TTE 06/23/2018: EF 20-25%. e.) TTE 03/01/2019: EF 15%. f.) TTE 02/27/2020: < 20%. g.) TTE 11/23/2020: EF <20%. h.) TTE 05/14/2021: < 15%.   NSTEMI (non-ST elevated myocardial infarction) (HCC)    a.) x 3 per patient report ---> 2012, 2014, 2016   Obesity    On supplemental oxygen by nasal cannula    a.) 2-3 L/[place] PRN   OSA on CPAP    PAF (paroxysmal atrial fibrillation) (HCC)    a.) CHA2DS2-VASc = 4 (HFrEF, HTN, prior MI, T2DM). b.) rate/rhythm maintained without pharmacologial intervention; no current anticoagulation.   Pancreatitis    PSVT (paroxysmal supraventricular tachycardia)     Current Outpatient Medications  Medication Sig Dispense Refill   albuterol  (VENTOLIN  HFA) 108 (90 Base) MCG/ACT inhaler Inhale 2 puffs into the lungs every 6 (six) hours as needed for wheezing or shortness of breath. 8.5 g 6   allopurinol  (ZYLOPRIM ) 100 MG tablet Take 100 mg by mouth daily.     amiodarone  (PACERONE ) 200 MG tablet Take 1 tablet (200 mg total) by mouth daily.     atorvastatin  (LIPITOR ) 80 MG tablet Take 1 tablet (80 mg total) by mouth at bedtime. 90 tablet 3   colchicine  0.6 MG tablet Take 0.6 mg by mouth  daily.     doxycycline  (ADOXA) 100 MG tablet Take 100 mg by mouth  2 (two) times daily.     Fluticasone -Umeclidin-Vilant (TRELEGY ELLIPTA ) 200-62.5-25 MCG/ACT AEPB Inhale 1 puff into the lungs daily. 60 each 11   insulin  glargine (LANTUS ) 100 UNIT/ML injection Inject 15 Units into the skin daily.     metolazone  (ZAROXOLYN ) 2.5 MG tablet Take 1 tablet (2.5 mg total) by mouth every other day. (Patient not taking: Reported on 05/24/2024) 3 tablet 0   metoprolol  succinate (TOPROL -XL) 25 MG 24 hr tablet Take 1 tablet (25 mg total) by mouth daily. Take with or immediately following a meal. 30 tablet 11   midodrine  (PROAMATINE ) 5 MG tablet Take 1 tablet (5 mg total) by mouth 2 (two) times daily with a meal. Stop if SBP is >120     NON FORMULARY Pt uses a cpap nightly     pantoprazole  (PROTONIX ) 40 MG tablet Take 1 tablet (40 mg total) by mouth daily.     potassium chloride  SA (KLOR-CON  M) 20 MEQ tablet Take 2 tablets (40 mEq total) by mouth as directed. Take 2 tabs with every dose of metolazone  15 tablet 0   torsemide  (DEMADEX ) 20 MG tablet Take 40 mg by mouth 2 (two) times daily.     No current facility-administered medications for this visit.   Vitals:   05/27/24 0928  BP: 94/72  Pulse: 82  SpO2: 95%  Weight: (!) 310 lb (140.6 kg)   Wt Readings from Last 3 Encounters:  05/27/24 (!) 310 lb (140.6 kg)  05/24/24 (!) 310 lb (140.6 kg)  04/28/24 (!) 304 lb (137.9 kg)   Lab Results  Component Value Date   CREATININE 1.85 (H) 05/24/2024   CREATININE 2.44 (H) 04/27/2024   CREATININE 1.89 (H) 04/20/2024    PHYSICAL EXAM:  General: Well appearing.  Cor: No JVD. Regular rhythm, rate.  Lungs: clear. Wearing oxygen at 3L Abdomen: soft, nontender, distended. Extremities: no edema Neuro:. Affect pleasant   ECG: not done   ASSESSMENT & PLAN:  1: NICM with reduced ejection fraction- - NYHA class III - still remains fluid up with symptoms - Weight unchanged from last visit here 3 days ago.  - IV lasix  80mg  and potassium 40mg  3 days ago - he is not  interested in furoscix . Discussed IV lasix  again or metolazone  and he prefers metolazone . 2.5mg  daily X 2 with 40meq potassium with each dose of metolazone  - Stress test 03/01/2019: Abnormal myocardial perfusion scan no clear evidence of reversible ischemia inferior persistent defect either scar versus body habitus artifact severely dilated left ventricular function severely depressed LV function with ejection fraction of around 11%.   - Echo 03/01/2019: EF of 15% along with mild MR.  - Echo 02/27/20: EF of <20% - Echo 11/23/20: EF of <20% along with moderate LVH and moderately elevated PA pressure. - Echo 10/20/22: EF <20% along with mildly elevated PA pressure and mild MR.  - Echo 12/01/23: EF 30-35% with normal RV, mild MR - AICD (2016) in place but denies any shocks in a long time - GDMT limited by hypotension and CKD - farxiga  stopped due to continued groin infection and possibly contributing to his hypotension - continue metoprolol  succinate 25mg  daily  - continue torsemide  40mg  BID.  - in the past, spironolactone , losartan , entresto  has caused hypotension - saw HF provider Pearl) 05/24 - BNP 05/24/24 was 413.5 - BMET / BNP today   2: HTN with CKD- - BP 106/84 - continue  midodrine  5mg  BID unless SBP is >120.  - sees PCP (Mancheno) at [facility]  - BMP 05/24/24 reviewed: sodium 135, potassium 4.0, Cr 1.85 and GFR 44 - BMET today - saw nephrology Kolleen) 08/25, next appt 11/25  3: Obstructive sleep apnea- - poor sleep with use of Bipap  - wearing oxygen at 3L daily for symptoms relief  - patient to follow up with pulmonology for ventilator changes.  - upcoming sleep study per pt  - saw pulmonology Doyal) 08/25  4: Diabetes (managed by PCP)- - A1c 12/03/23 was 8.5% - continue lantus  15 units daily   5: Atrial fibrillation- - saw cardiology Debby) 06/25 - continue amiodarone  200mg  daily  - adamant that he does not want to resume apixaban ; aware of stroke risk - last  eye exam ~ 03/25 - TSH 02/24/24 was 4.803  6: VT- - Mg 12/06/23 reviewed and was 2.3.  - Saw EP, Dr. Debby 05/13/24 to evaluate for CRT-D upgrade with HFrEF and LBBB.   - ICD wires to be changed 07/13/24 with EP Cathy)  7: Hyperlipidemia- - LDL 02/24/24 was 157 - continue atorvastatin  80mg  daily (increased 06/25)   Return in 1 week, sooner if needed.   I spent 30 minutes reviewing records, interviewing/ examing patient and managing plan/ orders.    Ellouise DELENA Class, FNP 05/27/24

## 2024-05-30 ENCOUNTER — Telehealth: Payer: Self-pay | Admitting: Emergency Medicine

## 2024-05-30 ENCOUNTER — Ambulatory Visit: Attending: Sleep Medicine

## 2024-05-30 DIAGNOSIS — E662 Morbid (severe) obesity with alveolar hypoventilation: Secondary | ICD-10-CM

## 2024-05-30 DIAGNOSIS — G4736 Sleep related hypoventilation in conditions classified elsewhere: Secondary | ICD-10-CM | POA: Insufficient documentation

## 2024-05-30 DIAGNOSIS — G4733 Obstructive sleep apnea (adult) (pediatric): Secondary | ICD-10-CM | POA: Diagnosis present

## 2024-05-30 NOTE — Telephone Encounter (Signed)
 Called and left voicemail for patient regarding his status in Cardiac Rehab. Left number and request for return phone call.

## 2024-05-31 ENCOUNTER — Telehealth: Payer: Self-pay | Admitting: Family

## 2024-05-31 NOTE — Telephone Encounter (Signed)
 Called to confirm/remind patient of their appointment at the [facility] on 05/31/24.   Appointment:   [] Confirmed  [x] Left mess   [] No answer/No voice mail  [] VM Full/unable to leave message  [] Phone not in service  Patient reminded to bring all medications and/or complete list.  Confirmed patient has transportation. Gave directions, instructed to utilize valet parking.

## 2024-05-31 NOTE — Progress Notes (Unsigned)
 Advanced Heart Failure Clinic Note    PCP: Antone Bottoms, MD  Primary Cardiologist: Florencio Kava, MD / Elodie Ronal Maxwell, NP (last seen 06/25; returns 09/25) HF provider: Bulah Cornet, MD (last seen 05/24)  Chief Complaint: shortness of breath   HPI:  Oscar [name] is a 48 y/o male with a history of obstructive sleep apnea (currently with CPAP), MI, HTN, VT (successful shock X 3), CKD, COPD, PAF, gout, hyperlipidemia, GERD, IDDM, SVT,asthma,  hidradenitis suppurative, chronic LBBB and chronic heart failure with CRT-D (2016).  Stress test 03/01/2019: Abnormal myocardial perfusion scan no clear evidence of reversible ischemia inferior persistent defect either scar versus body habitus artifact severely dilated left ventricular function severely depressed LV function with ejection fraction of around 11%. Recommend medical therapy   Echo 03/01/2019: EF of 15% along with mild Oscar.  Echo 02/27/20: EF of <20% Echo 11/23/20: EF of <20% along with moderate LVH and moderately elevated PA pressure. Echo 10/20/22: EF <20% along with mildly elevated PA pressure and mild Oscar.   Was in the ED 04/27/23 due to groin abscess X 1 month with drainage. CT done with f/u to be done with surgeon.   Admitted 06/12/23 due to ICD firing after sudden onset of SOB, chest tightness and dizziness. He was able to stand up and make it to the bathroom when he felt his ICD firing again. Afterwards, his shortness of breath, chest pain and dizziness resolved. He called EMS and on route to the ED, he was told that his ICD fired again. Successful conversion after the 3rd shock. Cardiology consulted. Needed amiodarone  drip with transition to or amiodarone .   CPET done 10/09/23 revealed sub-maximal exercise test, so maximal cardiorespiratory capacity could not be determined. Moderate to severe functional impairment appreciated. There were signs of a mixed cardiopulmonary limitation to exercise.   Was in the ED 11/07/23 with right  elbow pain. Has a history of gout. Was given colchicine  but has to wait 3 days before starting because he had been taking it daily for several weeks.  Admitted 12/01/23 with HF/ COPD exacerbation. IV diuresed. Cardiology consulted. IV solu-medrol  given with nebulizer, antibiotics. Given prednisone  taper at discharge. Troponin elevated due to demand ischemia. Echo 12/01/23: EF 30-35% with normal RV, mild Oscar  Was in the ED 12/03/23 with an episode of chest pain when getting in the shower. Also had lightheadedness and shortness of breath. Trponin 80 & then 130. EKG without acute ischemia. SOB improved and he was released.   Admitted 12/04/23 with worsening shortness of breath. Found to be + covid. Given IV lasix  for a/c heart failure. Given IV steroids with transition to oral prednisone  for COPD exacerbation. Had episode of chest pain, improved after giving bronchodilator. Troponin trending down not consistent with ACS.   Seen in [facility] 04/25 and had torsemide  decreased to 20mg  daily after getting BMET back.   Seen in [facility] 06/25 and sent for IV lasix . Amlodipine  and losartan  were both stopped due to hypotension.   Seen in [facility] 04/18/24 fluid up with elevated ReDs reading and worsening symptoms where torsemide  was increased to 80mg  daily and farxiga  was stopped. Midodrine  started BID as he rarely took it because he wasn't checking his BP.   Last seen in [facility] 05/24/24 where he was sent for outpatient IV lasix . After receiving IV lasix , he went to the ER with varying complaints. Chronic troponin elevation. BNP elevated but he had already received IV lasix . Viral swab was negative. UA without infection.   He  presents today for a HF follow-up visit with a chief complaint of shortness of breath (improving). Has associated fatigue, occasional chest pain, abdominal distention (improved) and tastes blood in his mouth after he coughs. Able to produce brown sputum, no bright red blood in sputum. Received 80mg  IV lasix  3 days ago  and says that he feels a little bit better.   He is currently using continuous O2 on 3L daily with symptom relief but endorses if it is removed he will be short of breath.   Patint has an appt November 12th to have ICD wires changed.   ROS: All systems negative except as listed in HPI, PMH and Problem List.  SH:  Social History   Socioeconomic History   Marital status: Single    Spouse name: Not on file   Number of children: 0   Years of education: Not on file   Highest education level: Some college, no degree  Occupational History   Occupation: unemployed  Tobacco Use   Smoking status: Former    Current packs/day: 0.00    Average packs/day: 1.5 packs/day for 34.0 years (51.0 ttl pk-yrs)    Types: Cigarettes    Quit date: 05/2023    Years since quitting: 1.0    Passive exposure: Past   Smokeless tobacco: Never   Tobacco comments:    Quit smoking 05/2023  Vaping Use   Vaping status: Never Used  Substance and Sexual Activity   Alcohol use: No   Drug use: No   Sexual activity: Yes  Other Topics Concern   Not on file  Social History Narrative   Live alone   Social Drivers of Health   Financial Resource Strain: Medium Risk (12/02/2023)   Overall Financial Resource Strain (CARDIA)    Difficulty of Paying Living Expenses: Somewhat hard  Food Insecurity: No Food Insecurity (12/04/2023)   Hunger Vital Sign    Worried About Running Out of Food in the Last Year: Never true    Ran Out of Food in the Last Year: Never true  Recent Concern: Food Insecurity - Food Insecurity Present (11/23/2023)   Received from [facility]   Hunger Vital Sign    Within the past 12 months, you worried that your food would run out before you got the money to buy more.: Sometimes true    Within the past 12 months, the food you bought just didn't last and you didn't have money to get more.: Sometimes true  Transportation Needs: No Transportation Needs (12/04/2023)   PRAPARE -  Administrator, Civil Service (Medical): No    Lack of Transportation (Non-Medical): No  Physical Activity: Not on file  Stress: Not on file  Social Connections: Moderately Isolated (12/04/2023)   Social Connection and Isolation Panel    Frequency of Communication with Friends and Family: More than three times a week    Frequency of Social Gatherings with Friends and Family: Once a week    Attends Religious Services: More than 4 times per year    Active Member of [employer] or Organizations: No    Attends Banker Meetings: Never    Marital Status: Never married  Intimate Partner Violence: Not At Risk (12/04/2023)   Humiliation, Afraid, Rape, and Kick questionnaire    Fear of Current or Ex-Partner: No    Emotionally Abused: No    Physically Abused: No    Sexually Abused: No    FH:  Family History  Problem Relation Age  of Onset   Hypertension Mother    Congestive Heart Failure Mother    Hypertension Sister    Diabetes Sister    Pancreatitis Sister    COPD Sister    Emphysema Sister        smoked   Pancreatitis Brother    Anemia Neg Hx    Arrhythmia Neg Hx    Asthma Neg Hx    Clotting disorder Neg Hx    Fainting Neg Hx    Heart attack Neg Hx    Heart disease Neg Hx    Heart failure Neg Hx    Hyperlipidemia Neg Hx     Past Medical History:  Diagnosis Date   AICD (automatic cardioverter/defibrillator) present    Aortic atherosclerosis    Asthma    Chronic respiratory failure (HCC)    CKD (chronic kidney disease), stage III (HCC)    COPD (chronic obstructive pulmonary disease) (HCC)    Deafness in right ear    Diabetes mellitus without complication (HCC)    Dyspnea    GERD (gastroesophageal reflux disease)    Gout    HFrEF (heart failure with reduced ejection fraction) (HCC)    a.) TTE 12/10/14: EF 25%; diff inf HK; sev LV and mod LA dil; LVH. b.) TTE 06/23/18: EF 20-25%; LVH; mild LV dil, mild BAE. c.) TTE 03/01/19: EF 15%, LVH, BAE; triv PR/TR. d.)  TTE 02/27/20: EF < 20%; sev LV dil; mild Oscar. e.) TTE 11/23/20: EF < 20%; glob HK; sev LV dil; LVH; mild-mod BAE; mod-sev TR, triv AR; G1DD. f.) TTE 05/14/21: EF <15%; LVH; sev LA and mild RV enlar; triv AR/PR, mild TR, mod Oscar.   Hiatal hernia    History of cardiac catheterization    a.) R/LHC 12/14/2009: normal coronaries. b.) R/LHC 12/11/2014: normal coronaries.   Hyperlipidemia    Hypertension    Hypoxemia    LBBB (left bundle branch block)    NICM (nonischemic cardiomyopathy; dilated cardiomyopathy) (HCC)    a.) R/LHC 12/14/2009: normal cors; LVEDP 18 mmHg, mean PA 29 mmHg, mean PCWP 31 mmHg; CO 6 L/min; CI 2.54 L/min/m. b.) TTE 12/10/2014: EF 25%. c.) R/LHC 12/11/2014: mean RA 9 mmHg, mean PA 22 mmHg, mean PCWP 22 mmHg. d.) TTE 06/23/2018: EF 20-25%. e.) TTE 03/01/2019: EF 15%. f.) TTE 02/27/2020: < 20%. g.) TTE 11/23/2020: EF <20%. h.) TTE 05/14/2021: < 15%.   NSTEMI (non-ST elevated myocardial infarction) (HCC)    a.) x 3 per patient report ---> 2012, 2014, 2016   Obesity    On supplemental oxygen by nasal cannula    a.) 2-3 L/[place] PRN   OSA on CPAP    PAF (paroxysmal atrial fibrillation) (HCC)    a.) CHA2DS2-VASc = 4 (HFrEF, HTN, prior MI, T2DM). b.) rate/rhythm maintained without pharmacologial intervention; no current anticoagulation.   Pancreatitis    PSVT (paroxysmal supraventricular tachycardia)     Current Outpatient Medications  Medication Sig Dispense Refill   albuterol  (VENTOLIN  HFA) 108 (90 Base) MCG/ACT inhaler Inhale 2 puffs into the lungs every 6 (six) hours as needed for wheezing or shortness of breath. 8.5 g 6   allopurinol  (ZYLOPRIM ) 100 MG tablet Take 100 mg by mouth daily.     amiodarone  (PACERONE ) 200 MG tablet Take 1 tablet (200 mg total) by mouth daily.     atorvastatin  (LIPITOR ) 80 MG tablet Take 1 tablet (80 mg total) by mouth at bedtime. 90 tablet 3   colchicine  0.6 MG tablet Take 0.6 mg by mouth  daily.     doxycycline  (ADOXA) 100 MG tablet Take 100 mg by mouth  2 (two) times daily.     Fluticasone -Umeclidin-Vilant (TRELEGY ELLIPTA ) 200-62.5-25 MCG/ACT AEPB Inhale 1 puff into the lungs daily. 60 each 11   insulin  glargine (LANTUS ) 100 UNIT/ML injection Inject 15 Units into the skin daily.     metolazone  (ZAROXOLYN ) 2.5 MG tablet Take 1 tablet (2.5 mg total) by mouth every other day. (Patient not taking: Reported on 05/27/2024) 3 tablet 0   metolazone  (ZAROXOLYN ) 2.5 MG tablet Take 1 tablet (2.5 mg total) by mouth daily for 2 days. 2 tablet 0   metoprolol  succinate (TOPROL -XL) 25 MG 24 hr tablet Take 1 tablet (25 mg total) by mouth daily. Take with or immediately following a meal. 30 tablet 11   midodrine  (PROAMATINE ) 5 MG tablet Take 1 tablet (5 mg total) by mouth 2 (two) times daily with a meal. Stop if SBP is >120     NON FORMULARY Pt uses a cpap nightly     pantoprazole  (PROTONIX ) 40 MG tablet Take 1 tablet (40 mg total) by mouth daily.     potassium chloride  SA (KLOR-CON  M) 20 MEQ tablet Take 2 tablets (40 mEq total) by mouth as directed. Take 2 tabs with every dose of metolazone  15 tablet 0   torsemide  (DEMADEX ) 20 MG tablet Take 40 mg by mouth 2 (two) times daily.     No current facility-administered medications for this visit.   There were no vitals filed for this visit.  Wt Readings from Last 3 Encounters:  05/27/24 (!) 140.6 kg  05/24/24 (!) 140.6 kg  04/28/24 (!) 137.9 kg   Lab Results  Component Value Date   CREATININE 1.63 (H) 05/27/2024   CREATININE 1.85 (H) 05/24/2024   CREATININE 2.44 (H) 04/27/2024    PHYSICAL EXAM:  General: Well appearing.  Cor: No JVD. Regular rhythm, rate.  Lungs: clear. Wearing oxygen at 3L Abdomen: soft, nontender, distended. Extremities: no edema Neuro:. Affect pleasant   ECG: not done   ASSESSMENT & PLAN:  1: NICM with reduced ejection fraction- - NYHA class III - still remains fluid up with symptoms - Weight unchanged from last visit here 3 days ago.  - IV lasix  80mg  and potassium 40mg  3  days ago - he is not interested in furoscix . Discussed IV lasix  again or metolazone  and he prefers metolazone . 2.5mg  daily X 2 with 40meq potassium with each dose of metolazone  - Stress test 03/01/2019: Abnormal myocardial perfusion scan no clear evidence of reversible ischemia inferior persistent defect either scar versus body habitus artifact severely dilated left ventricular function severely depressed LV function with ejection fraction of around 11%.   - Echo 03/01/2019: EF of 15% along with mild Oscar.  - Echo 02/27/20: EF of <20% - Echo 11/23/20: EF of <20% along with moderate LVH and moderately elevated PA pressure. - Echo 10/20/22: EF <20% along with mildly elevated PA pressure and mild Oscar.  - Echo 12/01/23: EF 30-35% with normal RV, mild Oscar - AICD (2016) in place but denies any shocks in a long time - GDMT limited by hypotension and CKD - farxiga  stopped due to continued groin infection and possibly contributing to his hypotension - continue metoprolol  succinate 25mg  daily  - continue torsemide  40mg  BID.  - in the past, spironolactone , losartan , entresto  has caused hypotension - saw HF provider Pearl) 05/24 - BNP 05/24/24 was 413.5 - BMET / BNP today   2: HTN with CKD- - BP 106/84 -  continue midodrine  5mg  BID unless SBP is >120.  - sees PCP (Mancheno) at [facility]  - BMP 05/24/24 reviewed: sodium 135, potassium 4.0, Cr 1.85 and GFR 44 - BMET today - saw nephrology Kolleen) 08/25, next appt 11/25  3: Obstructive sleep apnea- - poor sleep with use of Bipap  - wearing oxygen at 3L daily for symptoms relief  - patient to follow up with pulmonology for ventilator changes.  - upcoming sleep study per pt  - saw pulmonology Doyal) 08/25  4: Diabetes (managed by PCP)- - A1c 12/03/23 was 8.5% - continue lantus  15 units daily   5: Atrial fibrillation- - saw cardiology Debby) 06/25 - continue amiodarone  200mg  daily  - adamant that he does not want to resume apixaban ; aware  of stroke risk - last eye exam ~ 03/25 - TSH 02/24/24 was 4.803  6: VT- - Mg 12/06/23 reviewed and was 2.3.  - Saw EP, Dr. Debby 05/13/24 to evaluate for CRT-D upgrade with HFrEF and LBBB.   - ICD wires to be changed 07/13/24 with EP Cathy)  7: Hyperlipidemia- - LDL 02/24/24 was 157 - continue atorvastatin  80mg  daily (increased 06/25)   Return in 1 week, sooner if needed.   I spent 30 minutes reviewing records, interviewing/ examing patient and managing plan/ orders.    Oscar Blumenthal, RN 05/31/24

## 2024-06-01 ENCOUNTER — Telehealth: Payer: Self-pay | Admitting: Family

## 2024-06-01 ENCOUNTER — Encounter: Payer: Self-pay | Admitting: Family

## 2024-06-01 ENCOUNTER — Ambulatory Visit: Attending: Family | Admitting: Family

## 2024-06-01 VITALS — BP 97/70 | HR 81 | Wt 307.6 lb

## 2024-06-01 DIAGNOSIS — I5020 Unspecified systolic (congestive) heart failure: Secondary | ICD-10-CM

## 2024-06-01 DIAGNOSIS — I10 Essential (primary) hypertension: Secondary | ICD-10-CM | POA: Diagnosis not present

## 2024-06-01 DIAGNOSIS — G4733 Obstructive sleep apnea (adult) (pediatric): Secondary | ICD-10-CM | POA: Diagnosis not present

## 2024-06-01 DIAGNOSIS — I252 Old myocardial infarction: Secondary | ICD-10-CM | POA: Insufficient documentation

## 2024-06-01 DIAGNOSIS — I130 Hypertensive heart and chronic kidney disease with heart failure and stage 1 through stage 4 chronic kidney disease, or unspecified chronic kidney disease: Secondary | ICD-10-CM | POA: Insufficient documentation

## 2024-06-01 DIAGNOSIS — I447 Left bundle-branch block, unspecified: Secondary | ICD-10-CM | POA: Insufficient documentation

## 2024-06-01 DIAGNOSIS — Z87891 Personal history of nicotine dependence: Secondary | ICD-10-CM | POA: Diagnosis not present

## 2024-06-01 DIAGNOSIS — I471 Supraventricular tachycardia, unspecified: Secondary | ICD-10-CM | POA: Insufficient documentation

## 2024-06-01 DIAGNOSIS — N1831 Chronic kidney disease, stage 3a: Secondary | ICD-10-CM

## 2024-06-01 DIAGNOSIS — N183 Chronic kidney disease, stage 3 unspecified: Secondary | ICD-10-CM | POA: Insufficient documentation

## 2024-06-01 DIAGNOSIS — Z95810 Presence of automatic (implantable) cardiac defibrillator: Secondary | ICD-10-CM | POA: Insufficient documentation

## 2024-06-01 DIAGNOSIS — Z79899 Other long term (current) drug therapy: Secondary | ICD-10-CM | POA: Insufficient documentation

## 2024-06-01 DIAGNOSIS — J449 Chronic obstructive pulmonary disease, unspecified: Secondary | ICD-10-CM | POA: Diagnosis not present

## 2024-06-01 DIAGNOSIS — E785 Hyperlipidemia, unspecified: Secondary | ICD-10-CM | POA: Insufficient documentation

## 2024-06-01 DIAGNOSIS — E1122 Type 2 diabetes mellitus with diabetic chronic kidney disease: Secondary | ICD-10-CM | POA: Insufficient documentation

## 2024-06-01 DIAGNOSIS — I428 Other cardiomyopathies: Secondary | ICD-10-CM | POA: Insufficient documentation

## 2024-06-01 DIAGNOSIS — R0602 Shortness of breath: Secondary | ICD-10-CM | POA: Diagnosis present

## 2024-06-01 DIAGNOSIS — E119 Type 2 diabetes mellitus without complications: Secondary | ICD-10-CM

## 2024-06-01 DIAGNOSIS — I480 Paroxysmal atrial fibrillation: Secondary | ICD-10-CM

## 2024-06-01 DIAGNOSIS — I472 Ventricular tachycardia, unspecified: Secondary | ICD-10-CM

## 2024-06-01 DIAGNOSIS — K219 Gastro-esophageal reflux disease without esophagitis: Secondary | ICD-10-CM | POA: Insufficient documentation

## 2024-06-01 DIAGNOSIS — E782 Mixed hyperlipidemia: Secondary | ICD-10-CM

## 2024-06-01 DIAGNOSIS — Z794 Long term (current) use of insulin: Secondary | ICD-10-CM

## 2024-06-01 NOTE — Patient Instructions (Signed)
 Medication Changes:  NO MEDICATION CHANGES.   Lab Work:  Go downstairs to [place] on LOWER LEVEL to have your blood work completed.  We will only call you if the results are abnormal or if the provider would like to make medication changes.  No news is good news.   Follow-Up in: 2 MONTHS WITH ELLOUISE CLASS, FNP.   Thank you for choosing [facility].    At the Advanced Heart Failure Clinic, you and your health needs are our priority. We have a designated team specialized in the treatment of Heart Failure. This Care Team includes your primary Heart Failure Specialized Cardiologist (physician), Advanced Practice Providers (APPs- Physician Assistants and Nurse Practitioners), and Pharmacist who all work together to provide you with the care you need, when you need it.   You may see any of the following providers on your designated Care Team at your next follow up:  Dr. Toribio Fuel Dr. Ezra Shuck Dr. Ria Commander Dr. Morene Brownie ELLOUISE CLASS, FNP Jaun Bash, RPH-CPP  Please be sure to bring in all your medications bottles to every appointment.   Need to Contact Us :  If you have any questions or concerns before your next appointment please send us  a message through [place] or call our office at [phone].    TO LEAVE A MESSAGE FOR THE NURSE SELECT OPTION 2, PLEASE LEAVE A MESSAGE INCLUDING: YOUR NAME DATE OF BIRTH CALL BACK NUMBER REASON FOR CALL**this is important as we prioritize the call backs  YOU WILL RECEIVE A CALL BACK THE SAME DAY AS LONG AS YOU CALL BEFORE 4:00 PM

## 2024-06-02 ENCOUNTER — Ambulatory Visit: Payer: Self-pay | Admitting: Family

## 2024-06-02 ENCOUNTER — Other Ambulatory Visit: Payer: Self-pay | Admitting: Family

## 2024-06-02 LAB — HEMOGLOBIN A1C
Est. average glucose Bld gHb Est-mCnc: 203 mg/dL
Hgb A1c MFr Bld: 8.7 % — ABNORMAL HIGH (ref 4.8–5.6)

## 2024-06-02 LAB — BASIC METABOLIC PANEL WITH GFR
BUN/Creatinine Ratio: 26 — ABNORMAL HIGH (ref 9–20)
BUN: 50 mg/dL — ABNORMAL HIGH (ref 6–24)
CO2: 23 mmol/L (ref 20–29)
Calcium: 9.3 mg/dL (ref 8.7–10.2)
Chloride: 94 mmol/L — ABNORMAL LOW (ref 96–106)
Creatinine, Ser: 1.89 mg/dL — ABNORMAL HIGH (ref 0.76–1.27)
Glucose: 215 mg/dL — ABNORMAL HIGH (ref 70–99)
Potassium: 4.1 mmol/L (ref 3.5–5.2)
Sodium: 135 mmol/L (ref 134–144)
eGFR: 43 mL/min/1.73 — ABNORMAL LOW (ref >59)

## 2024-06-02 LAB — MAGNESIUM: Magnesium: 1.8 mg/dL (ref 1.6–2.3)

## 2024-06-02 NOTE — Telephone Encounter (Signed)
 Spoke with patient. He is going to call pulmonology office to send send study.Oscar [name]

## 2024-06-03 ENCOUNTER — Telehealth: Payer: Self-pay

## 2024-06-03 NOTE — Telephone Encounter (Signed)
 Spoke to Faith at [employer]. She reports that patient had vent removed, due to payment. Patient reports his insurance should cover vent. Patient will call [employer].   Patient did complete sleep study on Monday 05/30/2024. Dr. Jess has not reviewed sleep study.

## 2024-06-03 NOTE — Telephone Encounter (Signed)
 Copied from CRM #[id number]. Topic: Clinical - Order For Equipment >> Jun 03, 2024 12:48 PM Rozanna MATSU wrote: Reason for CRM: Pt called stated he needs a new order to get the vent stated [employer] is needing this information. Pt is on Bipap machine and wants to switch back the Ventilator. Stated they are needing the home sleep results.

## 2024-06-06 ENCOUNTER — Encounter: Payer: Self-pay | Admitting: Emergency Medicine

## 2024-06-06 ENCOUNTER — Telehealth: Payer: Self-pay | Admitting: Emergency Medicine

## 2024-06-06 DIAGNOSIS — I5022 Chronic systolic (congestive) heart failure: Secondary | ICD-10-CM

## 2024-06-06 NOTE — Telephone Encounter (Signed)
 Spoke with Oscar [name] regarding his continuation of cardiac rehab. Patient states that he does not wish to continue at this time due to changes in his medical treatment. Encouraged patient to have his provider sent another referral if he wishes to return in the future. Will discharge patient early at this time.

## 2024-06-06 NOTE — Progress Notes (Unsigned)
 Cardiac Individual Treatment Plan  Patient Details  Name: Oscar [name] MRN: [id number] Date of Birth: 07-Apr-1976 Referring Provider:   Flowsheet Row Cardiac Rehab from 01/26/2024 in [facility] Cardiac and Pulmonary Rehab  Referring Provider Florencio Kava, MD    Initial Encounter Date:  Flowsheet Row Cardiac Rehab from 01/26/2024 in [facility] Cardiac and Pulmonary Rehab  Date 01/26/24    Visit Diagnosis: Chronic systolic heart failure (HCC)  Patient's Home Medications on Admission:  Current Outpatient Medications:    albuterol  (VENTOLIN  HFA) 108 (90 Base) MCG/ACT inhaler, Inhale 2 puffs into the lungs every 6 (six) hours as needed for wheezing or shortness of breath., Disp: 8.5 g, Rfl: 6   allopurinol  (ZYLOPRIM ) 100 MG tablet, Take 100 mg by mouth daily., Disp: , Rfl:    amiodarone  (PACERONE ) 200 MG tablet, Take 1 tablet (200 mg total) by mouth daily., Disp: , Rfl:    atorvastatin  (LIPITOR ) 80 MG tablet, Take 1 tablet (80 mg total) by mouth at bedtime., Disp: 90 tablet, Rfl: 3   colchicine  0.6 MG tablet, Take 0.6 mg by mouth daily., Disp: , Rfl:    doxycycline  (ADOXA) 100 MG tablet, Take 100 mg by mouth 2 (two) times daily., Disp: , Rfl:    Fluticasone -Umeclidin-Vilant (TRELEGY ELLIPTA ) 200-62.5-25 MCG/ACT AEPB, Inhale 1 puff into the lungs daily., Disp: 60 each, Rfl: 11   insulin  glargine (LANTUS ) 100 UNIT/ML injection, Inject 15 Units into the skin daily., Disp: , Rfl:    metolazone  (ZAROXOLYN ) 2.5 MG tablet, Take 1 tablet (2.5 mg total) by mouth every other day. (Patient not taking: Reported on 06/01/2024), Disp: 3 tablet, Rfl: 0   metolazone  (ZAROXOLYN ) 2.5 MG tablet, Take 1 tablet (2.5 mg total) by mouth daily for 2 days. (Patient not taking: Reported on 06/01/2024), Disp: 2 tablet, Rfl: 0   metoprolol  succinate (TOPROL -XL) 25 MG 24 hr tablet, Take 1 tablet (25 mg total) by mouth daily. Take with or immediately following a meal., Disp: 30 tablet, Rfl: 11   midodrine  (PROAMATINE ) 5 MG  tablet, Take 1 tablet (5 mg total) by mouth 2 (two) times daily with a meal. Stop if SBP is >120 (Patient not taking: Reported on 06/01/2024), Disp: , Rfl:    NON FORMULARY, Pt uses a cpap nightly, Disp: , Rfl:    pantoprazole  (PROTONIX ) 40 MG tablet, Take 1 tablet (40 mg total) by mouth daily., Disp: , Rfl:    potassium chloride  SA (KLOR-CON  M) 20 MEQ tablet, Take 2 tablets (40 mEq total) by mouth as directed. Take 2 tabs with every dose of metolazone , Disp: 15 tablet, Rfl: 0   torsemide  (DEMADEX ) 20 MG tablet, Take 40 mg by mouth 2 (two) times daily., Disp: , Rfl:   Past Medical History: Past Medical History:  Diagnosis Date   AICD (automatic cardioverter/defibrillator) present    Aortic atherosclerosis    Asthma    Chronic respiratory failure (HCC)    CKD (chronic kidney disease), stage III (HCC)    COPD (chronic obstructive pulmonary disease) (HCC)    Deafness in right ear    Diabetes mellitus without complication (HCC)    Dyspnea    GERD (gastroesophageal reflux disease)    Gout    HFrEF (heart failure with reduced ejection fraction) (HCC)    a.) TTE 12/10/14: EF 25%; diff inf HK; sev LV and mod LA dil; LVH. b.) TTE 06/23/18: EF 20-25%; LVH; mild LV dil, mild BAE. c.) TTE 03/01/19: EF 15%, LVH, BAE; triv PR/TR. d.) TTE 02/27/20: EF < 20%;  sev LV dil; mild MR. e.) TTE 11/23/20: EF < 20%; glob HK; sev LV dil; LVH; mild-mod BAE; mod-sev TR, triv AR; G1DD. f.) TTE 05/14/21: EF <15%; LVH; sev LA and mild RV enlar; triv AR/PR, mild TR, mod MR.   Hiatal hernia    History of cardiac catheterization    a.) R/LHC 12/14/2009: normal coronaries. b.) R/LHC 12/11/2014: normal coronaries.   Hyperlipidemia    Hypertension    Hypoxemia    LBBB (left bundle branch block)    NICM (nonischemic cardiomyopathy; dilated cardiomyopathy) (HCC)    a.) R/LHC 12/14/2009: normal cors; LVEDP 18 mmHg, mean PA 29 mmHg, mean PCWP 31 mmHg; CO 6 L/min; CI 2.54 L/min/m. b.) TTE 12/10/2014: EF 25%. c.) R/LHC 12/11/2014:  mean RA 9 mmHg, mean PA 22 mmHg, mean PCWP 22 mmHg. d.) TTE 06/23/2018: EF 20-25%. e.) TTE 03/01/2019: EF 15%. f.) TTE 02/27/2020: < 20%. g.) TTE 11/23/2020: EF <20%. h.) TTE 05/14/2021: < 15%.   NSTEMI (non-ST elevated myocardial infarction) (HCC)    a.) x 3 per patient report ---> 2012, 2014, 2016   Obesity    On supplemental oxygen by nasal cannula    a.) 2-3 L/ PRN   OSA on CPAP    PAF (paroxysmal atrial fibrillation) (HCC)    a.) CHA2DS2-VASc = 4 (HFrEF, HTN, prior MI, T2DM). b.) rate/rhythm maintained without pharmacologial intervention; no current anticoagulation.   Pancreatitis    PSVT (paroxysmal supraventricular tachycardia)     Tobacco Use: Social History   Tobacco Use  Smoking Status Former   Current packs/day: 0.00   Average packs/day: 1.5 packs/day for 34.0 years (51.0 ttl pk-yrs)   Types: Cigarettes   Quit date: 05/2023   Years since quitting: 1.0   Passive exposure: Past  Smokeless Tobacco Never  Tobacco Comments   Quit smoking 05/2023    Labs: Review Flowsheet  More data exists      Latest Ref Rng & Units 12/03/2023 12/04/2023 02/04/2024 02/24/2024 06/01/2024  Labs for ITP Cardiac and Pulmonary Rehab  Cholestrol 0 - 200 mg/dL - - 743  787  -  LDL (calc) 0 - 99 mg/dL - - 805  842  -  HDL-C >40 mg/dL - - 41  38  -  Trlycerides <150 mg/dL - - 896  87  -  Hemoglobin A1c 4.8 - 5.6 % 8.5  - - - 8.7   Bicarbonate 20.0 - 28.0 mmol/L - 29.2  - - -  O2 Saturation % - 89.3  - - -     Exercise Target Goals: Exercise Program Goal: Individual exercise prescription set using results from initial 6 min walk test and THRR while considering  patient's activity barriers and safety.   Exercise Prescription Goal: Initial exercise prescription builds to 30-45 minutes a day of aerobic activity, 2-3 days per week.  Home exercise guidelines will be given to patient during program as part of exercise prescription that the participant will acknowledge.   Education: Aerobic  Exercise: - Group verbal and visual presentation on the components of exercise prescription. Introduces F.I.T.T principle from ACSM for exercise prescriptions.  Reviews F.I.T.T. principles of aerobic exercise including progression. Written material provided at class time. Flowsheet Row Cardiac Rehab from 04/06/2024 in [facility] Cardiac and Pulmonary Rehab  Education need identified 01/26/24  Date 04/06/24  Educator nt  Instruction Review Code 1- [employer] Understanding    Education: Resistance Exercise: - Group verbal and visual presentation on the components of exercise prescription. Introduces F.I.T.T principle from ACSM  for exercise prescriptions  Reviews F.I.T.T. principles of resistance exercise including progression. Written material provided at class time. Flowsheet Row Cardiac Rehab from 04/06/2024 in [facility] Cardiac and Pulmonary Rehab  Education need identified 01/26/24     Education: Exercise & Equipment Safety: - Individual verbal instruction and demonstration of equipment use and safety with use of the equipment. Flowsheet Row Cardiac Rehab from 04/06/2024 in [facility] Cardiac and Pulmonary Rehab  Date 01/26/24  Educator MB  Instruction Review Code 1- Verbalizes Understanding    Education: Exercise Physiology & General Exercise Guidelines: - Group verbal and written instruction with models to review the exercise physiology of the cardiovascular system and associated critical values. Provides general exercise guidelines with specific guidelines to those with heart or lung disease. Written material provided at class time. Flowsheet Row Cardiac Rehab from 04/06/2024 in [facility] Cardiac and Pulmonary Rehab  Education need identified 01/26/24    Education: Flexibility, Balance, Mind/Body Relaxation: - Group verbal and visual presentation with interactive activity on the components of exercise prescription. Introduces F.I.T.T principle from ACSM for exercise prescriptions. Reviews F.I.T.T. principles of  flexibility and balance exercise training including progression. Also discusses the mind body connection.  Reviews various relaxation techniques to help reduce and manage stress (i.e. Deep breathing, progressive muscle relaxation, and visualization). Balance handout provided to take home. Written material provided at class time.   Activity Barriers & Risk Stratification:  Activity Barriers & Cardiac Risk Stratification - 01/26/24 1028       Activity Barriers & Cardiac Risk Stratification   Activity Barriers Joint Problems;Other (comment)    Comments Elbow joint, gout in both feet    Cardiac Risk Stratification High          6 Minute Walk:  6 Minute Walk     Row Name 01/26/24 1027         6 Minute Walk   Phase Initial     Distance 800 feet     Walk Time 6 minutes     # of Rest Breaks 0     MPH 1.52     METS 2.06     RPE 11     Perceived Dyspnea  1     VO2 Peak 7.21     Symptoms No     Resting HR 60 bpm     Resting BP 104/70     Resting Oxygen Saturation  98 %     Exercise Oxygen Saturation  during 6 min walk 97 %     Max Ex. HR 100 bpm     Max Ex. BP 118/80     2 Minute Post BP 100/70        Oxygen Initial Assessment:  Oxygen Initial Assessment - 01/20/24 1430       Home Oxygen   Home Oxygen Device Home Concentrator;E-Tanks    Sleep Oxygen Prescription Continuous;CPAP    Liters per minute 3    Home Exercise Oxygen Prescription Continuous    Liters per minute 3   when winded, 2 l when not winded   Home Resting Oxygen Prescription Continuous    Liters per minute 2    Compliance with Home Oxygen Use Yes      Intervention   Short Term Goals To learn and exhibit compliance with exercise, home and travel O2 prescription;To learn and understand importance of monitoring SPO2 with pulse oximeter and demonstrate accurate use of the pulse oximeter.;To learn and understand importance of maintaining oxygen saturations>88%;To learn and demonstrate proper pursed  lip  breathing techniques or other breathing techniques. ;To learn and demonstrate proper use of respiratory medications    Long  Term Goals Exhibits compliance with exercise, home  and travel O2 prescription;Verbalizes importance of monitoring SPO2 with pulse oximeter and return demonstration;Maintenance of O2 saturations>88%;Exhibits proper breathing techniques, such as pursed lip breathing or other method taught during program session;Compliance with respiratory medication;Demonstrates proper use of MDI's          Oxygen Re-Evaluation:  Oxygen Re-Evaluation     Row Name 02/01/24 1014             Home Oxygen   Home Oxygen Device Home Concentrator;E-Tanks       Sleep Oxygen Prescription Continuous;CPAP       Liters per minute 3       Home Exercise Oxygen Prescription Continuous       Liters per minute 3       Home Resting Oxygen Prescription Continuous       Liters per minute 2       Compliance with Home Oxygen Use Yes         Goals/Expected Outcomes   Short Term Goals To learn and exhibit compliance with exercise, home and travel O2 prescription;To learn and understand importance of monitoring SPO2 with pulse oximeter and demonstrate accurate use of the pulse oximeter.;To learn and understand importance of maintaining oxygen saturations>88%;To learn and demonstrate proper pursed lip breathing techniques or other breathing techniques. ;To learn and demonstrate proper use of respiratory medications       Long  Term Goals Exhibits compliance with exercise, home  and travel O2 prescription;Verbalizes importance of monitoring SPO2 with pulse oximeter and return demonstration;Maintenance of O2 saturations>88%;Exhibits proper breathing techniques, such as pursed lip breathing or other method taught during program session;Compliance with respiratory medication;Demonstrates proper use of MDI's       Comments Reviewed RPE and dyspnea scale, THR and program prescription with pt today.  Pt voiced  understanding and was given a copy of goals to take home.       Goals/Expected Outcomes Short: Use RPE daily to regulate intensity. Long: Follow program prescription in THR.          Oxygen Discharge (Final Oxygen Re-Evaluation):  Oxygen Re-Evaluation - 02/01/24 1014       Home Oxygen   Home Oxygen Device Home Concentrator;E-Tanks    Sleep Oxygen Prescription Continuous;CPAP    Liters per minute 3    Home Exercise Oxygen Prescription Continuous    Liters per minute 3    Home Resting Oxygen Prescription Continuous    Liters per minute 2    Compliance with Home Oxygen Use Yes      Goals/Expected Outcomes   Short Term Goals To learn and exhibit compliance with exercise, home and travel O2 prescription;To learn and understand importance of monitoring SPO2 with pulse oximeter and demonstrate accurate use of the pulse oximeter.;To learn and understand importance of maintaining oxygen saturations>88%;To learn and demonstrate proper pursed lip breathing techniques or other breathing techniques. ;To learn and demonstrate proper use of respiratory medications    Long  Term Goals Exhibits compliance with exercise, home  and travel O2 prescription;Verbalizes importance of monitoring SPO2 with pulse oximeter and return demonstration;Maintenance of O2 saturations>88%;Exhibits proper breathing techniques, such as pursed lip breathing or other method taught during program session;Compliance with respiratory medication;Demonstrates proper use of MDI's    Comments Reviewed RPE and dyspnea scale, THR and program prescription with pt today.  Pt voiced  understanding and was given a copy of goals to take home.    Goals/Expected Outcomes Short: Use RPE daily to regulate intensity. Long: Follow program prescription in THR.          Initial Exercise Prescription:  Initial Exercise Prescription - 01/26/24 1000       Date of Initial Exercise RX and Referring Provider   Date 01/26/24    Referring Provider  Florencio Kava, MD      Oxygen   Oxygen Continuous    Liters 2-3L    Maintain Oxygen Saturation 88% or higher      Treadmill   MPH 1.4    Grade 0    Minutes 15    METs 2.07      Recumbant Bike   Level 2    RPM 50    Watts 25    Minutes 15    METs 2.06      NuStep   Level 2    SPM 80    Minutes 15    METs 2.06      T5 Nustep   Level 2    SPM 80    Minutes 15    METs 2.06      Biostep-RELP   Level 2    SPM 50    Minutes 15    METs 2.06      Prescription Details   Frequency (times per week) 2    Duration Progress to 30 minutes of continuous aerobic without signs/symptoms of physical distress      Intensity   THRR 40-80% of Max Heartrate 104-149    Ratings of Perceived Exertion 11-13    Perceived Dyspnea 0-4      Progression   Progression Continue to progress workloads to maintain intensity without signs/symptoms of physical distress.      Resistance Training   Training Prescription Yes    Weight 3 lb, 5 lb   3 lb (R), 5 lb (L)   Reps 10-15          Perform Capillary Blood Glucose checks as needed.  Exercise Prescription Changes:   Exercise Prescription Changes     Row Name 01/26/24 1000 02/18/24 1600 03/15/24 1100 03/29/24 0800 04/06/24 0900     Response to Exercise   Blood Pressure (Admit) 104/70 108/72 136/70 100/62 --   Blood Pressure (Exercise) 118/80 128/80 118/70 -- --   Blood Pressure (Exit) 100/70 118/74 118/62 90/68 --   Heart Rate (Admit) 60 bpm 81 bpm 70 bpm 55 bpm --   Heart Rate (Exercise) 100 bpm 105 bpm 107 bpm 94 bpm --   Heart Rate (Exit) 60 bpm 75 bpm 86 bpm 81 bpm --   Oxygen Saturation (Admit) 98 % -- -- -- --   Oxygen Saturation (Exercise) 97 % -- -- -- --   Oxygen Saturation (Exit) 97 % -- -- -- --   Rating of Perceived Exertion (Exercise) 11 11 14 12  --   Perceived Dyspnea (Exercise) 1 1 0 -- --   Symptoms none none none none --   Comments results first 2 weeks of exercise -- -- --   Duration -- Progress to 30  minutes of  aerobic without signs/symptoms of physical distress Progress to 30 minutes of  aerobic without signs/symptoms of physical distress Progress to 30 minutes of  aerobic without signs/symptoms of physical distress --   Intensity THRR New THRR unchanged THRR unchanged THRR unchanged --     Progression   Progression --  Continue to progress workloads to maintain intensity without signs/symptoms of physical distress. Continue to progress workloads to maintain intensity without signs/symptoms of physical distress. Continue to progress workloads to maintain intensity without signs/symptoms of physical distress. --   Average METs 2.06 5.46 1.97 2 --     Resistance Training   Training Prescription -- Yes Yes Yes --   Weight -- 3 lb, 5 lb  3 lb (R), 5 lb (L) 3 lb, 5 lb  3 lb (R), 5 lb (L) 0  Did not complete any resistance training since last review --   Reps -- 10-15 10-15 10-15 --     Interval Training   Interval Training -- No No No --     Oxygen   Oxygen -- Continuous Continuous Continuous --   Liters -- 3L 3L 3L --     Treadmill   MPH -- 1 1.3 1.3 --   Grade -- 0 0 0 --   Minutes -- 15 15 15  --   METs -- 1.77 1.99 1.99 --     Recumbant Bike   Level -- -- 3 -- --   Watts -- -- 25 -- --   Minutes -- -- 15 -- --   METs -- -- 2.56 -- --     T5 Nustep   Level -- 2 3 -- --   Minutes -- 15 15 -- --   METs -- 2 2 -- --     Biostep-RELP   Level -- -- -- 1 --   Minutes -- -- -- 15 --   METs -- -- -- 2 --     Home Exercise Plan   Plans to continue exercise at -- -- -- -- Home (comment)  Treadmill at home, looking into getting dumbbells   Frequency -- -- -- -- Add 3 additional days to program exercise sessions.   Initial Home Exercises Provided -- -- -- -- 04/06/24     Oxygen   Maintain Oxygen Saturation -- 88% or higher 88% or higher 88% or higher --    Row Name 04/14/24 0900             Response to Exercise   Blood Pressure (Admit) 108/62       Blood Pressure (Exit)  118/64       Heart Rate (Admit) 89 bpm       Heart Rate (Exercise) 101 bpm       Heart Rate (Exit) 84 bpm       Rating of Perceived Exertion (Exercise) 15       Symptoms none       Duration Progress to 30 minutes of  aerobic without signs/symptoms of physical distress       Intensity THRR unchanged         Progression   Progression Continue to progress workloads to maintain intensity without signs/symptoms of physical distress.       Average METs 1.84         Resistance Training   Training Prescription Yes       Weight 0  Did not complete any resistance training since last review       Reps 10-15         Interval Training   Interval Training No         Oxygen   Oxygen Continuous       Liters 3L         Treadmill   MPH 1.1  Grade 0       Minutes 15       METs 1.84         T5 Nustep   Level 1       Minutes 15         Home Exercise Plan   Plans to continue exercise at Home (comment)  Treadmill at home, looking into getting dumbbells       Frequency Add 3 additional days to program exercise sessions.       Initial Home Exercises Provided 04/06/24         Oxygen   Maintain Oxygen Saturation 88% or higher          Exercise Comments:   Exercise Comments     Row Name 02/01/24 1012 03/21/24 0944         Exercise Comments First full day of exercise!  Patient was oriented to gym and equipment including functions, settings, policies, and procedures.  Patient's individual exercise prescription and treatment plan were reviewed.  All starting workloads were established based on the results of the 6 minute walk test done at initial orientation visit.  The plan for exercise progression was also introduced and progression will be customized based on patient's performance and goals. Pt able to follow exercise prescription today. Pt stated he wasn't feeling well due to extra fluid he has and is leaving  a little early to go home and take his fluid pill.  Will continue to monitor  for progression.         Exercise Goals and Review:   Exercise Goals     Row Name 01/26/24 1036             Exercise Goals   Increase Physical Activity Yes       Intervention Provide advice, education, support and counseling about physical activity/exercise needs.;Develop an individualized exercise prescription for aerobic and resistive training based on initial evaluation findings, risk stratification, comorbidities and participant's personal goals.       Expected Outcomes Short Term: Attend rehab on a regular basis to increase amount of physical activity.;Long Term: Add in home exercise to make exercise part of routine and to increase amount of physical activity.;Long Term: Exercising regularly at least 3-5 days a week.       Increase Strength and Stamina Yes       Intervention Provide advice, education, support and counseling about physical activity/exercise needs.;Develop an individualized exercise prescription for aerobic and resistive training based on initial evaluation findings, risk stratification, comorbidities and participant's personal goals.       Expected Outcomes Short Term: Increase workloads from initial exercise prescription for resistance, speed, and METs.;Short Term: Perform resistance training exercises routinely during rehab and add in resistance training at home;Long Term: Improve cardiorespiratory fitness, muscular endurance and strength as measured by increased METs and functional capacity ( )       Able to understand and use rate of perceived exertion (RPE) scale Yes       Intervention Provide education and explanation on how to use RPE scale       Expected Outcomes Short Term: Able to use RPE daily in rehab to express subjective intensity level;Long Term:  Able to use RPE to guide intensity level when exercising independently       Able to understand and use Dyspnea scale Yes       Intervention Provide education and explanation on how to use Dyspnea scale        Expected  Outcomes Short Term: Able to use Dyspnea scale daily in rehab to express subjective sense of shortness of breath during exertion;Long Term: Able to use Dyspnea scale to guide intensity level when exercising independently       Knowledge and understanding of Target Heart Rate Range (THRR) Yes       Intervention Provide education and explanation of THRR including how the numbers were predicted and where they are located for reference       Expected Outcomes Short Term: Able to state/look up THRR;Short Term: Able to use daily as guideline for intensity in rehab;Long Term: Able to use THRR to govern intensity when exercising independently       Able to check pulse independently Yes       Intervention Provide education and demonstration on how to check pulse in carotid and radial arteries.;Review the importance of being able to check your own pulse for safety during independent exercise       Expected Outcomes Short Term: Able to explain why pulse checking is important during independent exercise;Long Term: Able to check pulse independently and accurately       Understanding of Exercise Prescription Yes       Intervention Provide education, explanation, and written materials on patient's individual exercise prescription       Expected Outcomes Short Term: Able to explain program exercise prescription;Long Term: Able to explain home exercise prescription to exercise independently          Exercise Goals Re-Evaluation :  Exercise Goals Re-Evaluation     Row Name 02/18/24 1629 03/01/24 1337 03/15/24 1103 03/29/24 0830 04/06/24 0947     Exercise Goal Re-Evaluation   Exercise Goals Review Increase Physical Activity;Increase Strength and Stamina;Understanding of Exercise Prescription Increase Physical Activity;Increase Strength and Stamina;Understanding of Exercise Prescription Increase Physical Activity;Increase Strength and Stamina;Understanding of Exercise Prescription Increase Physical  Activity;Increase Strength and Stamina;Understanding of Exercise Prescription Increase Physical Activity;Increase Strength and Stamina;Understanding of Exercise Prescription;Able to understand and use Dyspnea scale;Knowledge and understanding of Target Heart Rate Range (THRR);Able to understand and use rate of perceived exertion (RPE) scale;Able to check pulse independently   Comments Brandley is off to a good start in the program. He was able to attend his first 2 sessions during this review period. During these sessions he was able to use the treadmill at 1 mph and no incline, and the T5 nustep at level 2. We will continue to monitor his progress in the program. Cope has not attended rehab since the last review. We will contact him to see when he plans to return to regular attendance in the program. We will continue to monitor his progress when he returns to rehab. Dayvian is doing well in rehab. He has been able to increase his speed on the treadmill from 1 to 1.82mph. He has also been able to increase to level 3 on both the T5 nustep and recumbent bike. We will continue to monitor his progress in the program. Everest has only attended one session since the last review. He was able to walk the treadmill at a speed of 1.3 mph with no incline. He also worked at level 1 on the biostep, but did not do any resistance training due to him not feeling well and leaving early. We will continue to monitor his progress in the program. Reviewed home exercise with pt today.  Pt plans to use his treadmill at home for aerobic exercise and looking into getting dumbbells for resistance for exercise. He plans  to add 3-5 additional days of exercise at home. Reviewed THR, pulse, RPE, sign and symptoms, pulse oximetery and when to call 911 or MD.  Also discussed weather considerations and indoor options.  Pt voiced understanding.   Expected Outcomes Short: Continue to follow exercise prescription. Long: Continue exercise to improve  strength and stamina. Short: Return to rehab when appropriate. Long: Graduate from the program. Short: Continue to increase treadmill workload. Long: Continue exercise to improve strength and stamina. Short: Attend rehab more consistently. Long: Continue exercise to improve strength and stamina. Short: Add 3 additional days of exercise at home. Long: Continue to exercise at home independently.    Row Name 04/14/24 0945 04/28/24 0806 05/12/24 0753 05/24/24 1509       Exercise Goal Re-Evaluation   Exercise Goals Review Increase Physical Activity;Increase Strength and Stamina;Understanding of Exercise Prescription Increase Physical Activity;Increase Strength and Stamina;Understanding of Exercise Prescription -- Increase Physical Activity;Increase Strength and Stamina;Understanding of Exercise Prescription    Comments Abdel has only attended one session since the last review. During his one session his treadmill speed decreased from 1.3 mph to 1.1 mph with no incline. He also decreased to level 1 on the T5 nustep. We will encourage him to attend rehab more consistently and will continue to monitor his progress in the program. Jamareon has not attended rehab since the last review. He last attended on 08/27 and has been on medical hold due to issues with fluid retention. We will continue to monitor his progress when he returns to the program. Jonathen continues to be on medical hold due to issues with fluid retention. He last attended rehab on 08/27. We will continue to monitor his progress when he returns to the program. Johnta continues to be on medical hold. He last attended rehab on 08/27. We will call him to see when he plans to return. We will continue to monitor his progress when he returns to the program.    Expected Outcomes -- Short: Return to rehab when appropriate. Long: Continue to exercise at home independently. Short: Return to rehab when appropriate. Long: Continue to exercise at home independently.  Short: Return to rehab when appropriate. Long: Continue to exercise at home independently.       Discharge Exercise Prescription (Final Exercise Prescription Changes):  Exercise Prescription Changes - 04/14/24 0900       Response to Exercise   Blood Pressure (Admit) 108/62    Blood Pressure (Exit) 118/64    Heart Rate (Admit) 89 bpm    Heart Rate (Exercise) 101 bpm    Heart Rate (Exit) 84 bpm    Rating of Perceived Exertion (Exercise) 15    Symptoms none    Duration Progress to 30 minutes of  aerobic without signs/symptoms of physical distress    Intensity THRR unchanged      Progression   Progression Continue to progress workloads to maintain intensity without signs/symptoms of physical distress.    Average METs 1.84      Resistance Training   Training Prescription Yes    Weight 0   Did not complete any resistance training since last review   Reps 10-15      Interval Training   Interval Training No      Oxygen   Oxygen Continuous    Liters 3L      Treadmill   MPH 1.1    Grade 0    Minutes 15    METs 1.84      T5 Nustep  Level 1    Minutes 15      Home Exercise Plan   Plans to continue exercise at Home (comment)   Treadmill at home, looking into getting dumbbells   Frequency Add 3 additional days to program exercise sessions.    Initial Home Exercises Provided 04/06/24      Oxygen   Maintain Oxygen Saturation 88% or higher          Nutrition:  Target Goals: Understanding of nutrition guidelines, daily intake of sodium 1500mg , cholesterol 200mg , calories 30% from fat and 7% or less from saturated fats, daily to have 5 or more servings of fruits and vegetables.  Education: Nutrition 1 -Group instruction provided by verbal, written material, interactive activities, discussions, models, and posters to present general guidelines for heart healthy nutrition including macronutrients, label reading, and promoting whole foods over processed counterparts.  Education serves as Pensions consultant of discussion of heart healthy eating for all. Written material provided at class time.    Education: Nutrition 2 -Group instruction provided by verbal, written material, interactive activities, discussions, models, and posters to present general guidelines for heart healthy nutrition including sodium, cholesterol, and saturated fat. Providing guidance of habit forming to improve blood pressure, cholesterol, and body weight. Written material provided at class time.     Biometrics:  Pre Biometrics - 01/26/24 1036       Pre Biometrics   Height 5' 9.1 (1.755 m)    Weight 308 lb 4.8 oz (139.8 kg)    Waist Circumference 58 inches    Hip Circumference 55 inches    Waist to Hip Ratio 1.05 %    BMI (Calculated) 45.4    Single Leg Stand 7.9 seconds           Nutrition Therapy Plan and Nutrition Goals:  Nutrition Therapy & Goals - 02/01/24 1340       Nutrition Therapy   Diet Carb controlled, Cardiac, Low Na    Protein (specify units) 90g    Fiber 30 grams    Whole Grain Foods 3 servings    Saturated Fats 15 max. grams    Fruits and Vegetables 5 servings/day    Sodium 2 grams      Personal Nutrition Goals   Nutrition Goal Cut out sugary beverages, drink 32-48oz water daily    Personal Goal #2 Eat 15-30gProtein and 30-60gCarbs at each meal.    Personal Goal #3 Read labels and reduce sodium intake to below 2300mg . Ideally 1500mg  per day.    Comments Patient drinking mostly sugary beverages like juice and soda. Says he has tried to move away from soda, reports he was drinking 4-6L of soda a day. Explained to him the negative impact sugary beverages have on his health, blood sugar and weight. Set goal to switch over and drink more water, with short term goal of ~32-48oz of water daily with 64-80oz as a long term goal. Educated on carb portions of ~30-60g per meal, the importance of eating throughout the day and choosing high quality carbs rather than junk  food and sugary beverages. Provided him with Mediterranean diet handout. Educated on types of fats, sources, and how to read labels. He reports he adds salt to his foods, has been told to limit and look for better alternatives like misses dash. Confirmed and encouraged him to not salt food. Reinforced focus on keeping carb portions controlled at meals and limiting sodium.      Intervention Plan   Intervention Prescribe, educate and counsel regarding  individualized specific dietary modifications aiming towards targeted core components such as weight, hypertension, lipid management, diabetes, heart failure and other comorbidities.;Nutrition handout(s) given to patient.    Expected Outcomes Short Term Goal: Understand basic principles of dietary content, such as calories, fat, sodium, cholesterol and nutrients.;Short Term Goal: A plan has been developed with personal nutrition goals set during dietitian appointment.;Long Term Goal: Adherence to prescribed nutrition plan.          Nutrition Assessments:  MEDIFICTS Score Key: >=70 Need to make dietary changes  40-70 Heart Healthy Diet <= 40 Therapeutic Level Cholesterol Diet  Flowsheet Row Cardiac Rehab from 02/01/2024 in [facility] Cardiac [facility] Pul[facility]  Picture Your Plate Total Score on Admission 60   Picture Your Plate Scores: <59 Unhealthy dietary pattern with much room for improvement. 41-50 Dietary pattern unlikely to meet recommendations for good health and room for improvement. 51-60 More healthful dietary pattern, with some room for improvement.  >60 Healthy dietary pattern, although there may be some specific behaviors that could be improved.    Nutrition Goals Re-Evaluation:   Nutrition Goals Discharge (Final Nutrition Goals Re-Evaluation):   Psychosocial: Target Goals: Acknowledge presence or absence of significant depression and/or stress, maximize coping skills, provide positive support system. Participant is able to  verbalize types and ability to use techniques and skills needed for reducing stress and depression.   Education: Stress, Anxiety, and Depression - Group verbal and visual presentation to define topics covered.  Reviews how body is impacted by stress, anxiety, and depression.  Also discusses healthy ways to reduce stress and to treat/manage anxiety and depression. Written material provided at class time.   Education: Sleep Hygiene -Provides group verbal and written instruction about how sleep can affect your health.  Define sleep hygiene, discuss sleep cycles and impact of sleep habits. Review good sleep hygiene tips.   Initial Review & Psychosocial Screening:  Initial Psych Review & Screening - 01/20/24 1432       Initial Review   Current issues with None Identified      Family Dynamics   Good Support System? Yes   siblings,     Barriers   Psychosocial barriers to participate in program There are no identifiable barriers or psychosocial needs.      Screening Interventions   Interventions Encouraged to exercise;To provide support and resources with identified psychosocial needs;Provide feedback about the scores to participant    Expected Outcomes Short Term goal: Utilizing psychosocial counselor, staff and physician to assist with identification of specific Stressors or current issues interfering with healing process. Setting desired goal for each stressor or current issue identified.;Long Term Goal: Stressors or current issues are controlled or eliminated.;Short Term goal: Identification and review with participant of any Quality of Life or Depression concerns found by scoring the questionnaire.;Long Term goal: The participant improves quality of Life and PHQ9 Scores as seen by post scores and/or verbalization of changes          Quality of Life Scores:   Quality of Life - 02/01/24 1704       Quality of Life   Select Quality of Life      Quality of Life Scores   Health/Function  Pre 18.32 %    Socioeconomic Pre 27.5 %    Psych/Spiritual Pre 30 %    Family Pre 30 %    GLOBAL Pre 23.2 %         Scores of 19 and below usually indicate a poorer quality of life  in these areas.  A difference of  2-3 points is a clinically meaningful difference.  A difference of 2-3 points in the total score of the Quality of Life Index has been associated with significant improvement in overall quality of life, self-image, physical symptoms, and general health in studies assessing change in quality of life.  PHQ-9: Review Flowsheet  More data exists      01/26/2024 10/17/2021 06/15/2018 05/18/2018 03/05/2018  Depression screen PHQ 2/9  Decreased Interest 1 0 0 0 0  Down, Depressed, Hopeless 0 0 0 0 1  PHQ - 2 Score 1 0 0 0 1  Altered sleeping 3 - - - -  Tired, decreased energy 1 - - - -  Change in appetite 1 - - - -  Feeling bad or failure about yourself  0 - - - -  Trouble concentrating 0 - - - -  Moving slowly or fidgety/restless 1 - - - -  Suicidal thoughts 0 - - - -  PHQ-9 Score 7 - - - -  Difficult doing work/chores Somewhat difficult - - - -   Interpretation of Total Score  Total Score Depression Severity:  1-4 = Minimal depression, 5-9 = Mild depression, 10-14 = Moderate depression, 15-19 = Moderately severe depression, 20-27 = Severe depression   Psychosocial Evaluation and Intervention:  Psychosocial Evaluation - 01/20/24 1447       Psychosocial Evaluation & Interventions   Comments Libero has no barriers to attending the program. He lives alone. He is ready to start the program to work on exercise and learning more about amanging his heart failure.    Expected Outcomes STG attend all scheduled sessions, work on exercise progression as tolerated. Attend education sessions LTG COntinues with exercise progression, utilizes nutrition and education information to continue managing his health    Continue Psychosocial Services  Follow up required by staff           Psychosocial Re-Evaluation:   Psychosocial Discharge (Final Psychosocial Re-Evaluation):   Vocational Rehabilitation: Provide vocational rehab assistance to qualifying candidates.   Vocational Rehab Evaluation & Intervention:   Education: Education Goals: Education classes will be provided on a variety of topics geared toward better understanding of heart health and risk factor modification. Participant will state understanding/return demonstration of topics presented as noted by education test scores.  Learning Barriers/Preferences:   General Cardiac Education Topics:  AED/CPR: - Group verbal and written instruction with the use of models to demonstrate the basic use of the AED with the basic ABC's of resuscitation.   Test and Procedures: - Group verbal and visual presentation and models provide information about basic cardiac anatomy and function. Reviews the testing methods done to diagnose heart disease and the outcomes of the test results. Describes the treatment choices: Medical Management, Angioplasty, or Coronary Bypass Surgery for treating various heart conditions including Myocardial Infarction, Angina, Valve Disease, and Cardiac Arrhythmias. Written material provided at class time. Flowsheet Row Cardiac Rehab from 04/06/2024 in [facility] Cardiac and Pulmonary Rehab  Education need identified 01/26/24    Medication Safety: - Group verbal and visual instruction to review commonly prescribed medications for heart and lung disease. Reviews the medication, class of the drug, and side effects. Includes the steps to properly store meds and maintain the prescription regimen. Written material provided at class time.   Intimacy: - Group verbal instruction through game format to discuss how heart and lung disease can affect sexual intimacy. Written material provided at class time. Flowsheet Row Cardiac  Rehab from 04/06/2024 in [facility] Cardiac and Pulmonary Rehab  Date 04/06/24   Educator nt  Instruction Review Code 1- Verbalizes Understanding    Know Your Numbers and Heart Failure: - Group verbal and visual instruction to discuss disease risk factors for cardiac and pulmonary disease and treatment options.  Reviews associated critical values for Overweight/Obesity, Hypertension, Cholesterol, and Diabetes.  Discusses basics of heart failure: signs/symptoms and treatments.  Introduces Heart Failure Zone chart for action plan for heart failure. Written material provided at class time. Flowsheet Row Cardiac Rehab from 04/06/2024 in [facility] Cardiac and Pulmonary Rehab  Education need identified 01/26/24  Date 03/02/24  Educator sb  Instruction Review Code 1- Verbalizes Understanding    Infection Prevention: - Provides verbal and written material to individual with discussion of infection control including proper hand washing and proper equipment cleaning during exercise session. Flowsheet Row Cardiac Rehab from 04/06/2024 in [facility] Cardiac and Pulmonary Rehab  Date 01/26/24  Educator MB  Instruction Review Code 1- Verbalizes Understanding    Falls Prevention: - Provides verbal and written material to individual with discussion of falls prevention and safety. Flowsheet Row Cardiac Rehab from 04/06/2024 in [facility] Cardiac and Pulmonary Rehab  Date 01/26/24  Educator MB  Instruction Review Code 1- Verbalizes Understanding    Other: -Provides group and verbal instruction on various topics (see comments)   Knowledge Questionnaire Score:  Knowledge Questionnaire Score - 01/26/24 1038       Knowledge Questionnaire Score   Pre Score 20/26          Core Components/Risk Factors/Patient Goals at Admission:  Personal Goals and Risk Factors at Admission - 01/26/24 1038       Core Components/Risk Factors/Patient Goals on Admission    Weight Management Yes;Obesity;Weight Loss    Intervention Weight Management: Develop a combined nutrition and exercise program designed to  reach desired caloric intake, while maintaining appropriate intake of nutrient and fiber, sodium and fats, and appropriate energy expenditure required for the weight goal.;Weight Management: Provide education and appropriate resources to help participant work on and attain dietary goals.;Weight Management/Obesity: Establish reasonable short term and long term weight goals.;Obesity: Provide education and appropriate resources to help participant work on and attain dietary goals.    Admit Weight 308 lb 4.8 oz (139.8 kg)    Goal Weight: Short Term 274 lb (124.3 kg)    Goal Weight: Long Term 240 lb (108.9 kg)    Expected Outcomes Short Term: Continue to assess and modify interventions until short term weight is achieved;Long Term: Adherence to nutrition and physical activity/exercise program aimed toward attainment of established weight goal;Weight Loss: Understanding of general recommendations for a balanced deficit meal plan, which promotes 1-2 lb weight loss per week and includes a negative energy balance of [phone] kcal/d;Understanding recommendations for meals to include 15-35% energy as protein, 25-35% energy from fat, 35-60% energy from carbohydrates, less than 200mg  of dietary cholesterol, 20-35 gm of total fiber daily;Understanding of distribution of calorie intake throughout the day with the consumption of 4-5 meals/snacks    Diabetes Yes    Intervention Provide education about signs/symptoms and action to take for hypo/hyperglycemia.;Provide education about proper nutrition, including hydration, and aerobic/resistive exercise prescription along with prescribed medications to achieve blood glucose in normal ranges: Fasting glucose 65-99 mg/dL    Expected Outcomes Short Term: Participant verbalizes understanding of the signs/symptoms and immediate care of hyper/hypoglycemia, proper foot care and importance of medication, aerobic/resistive exercise and nutrition plan for blood glucose control.;Long Term:  Attainment of HbA1C < 7%.    Heart Failure Yes    Intervention Provide a combined exercise and nutrition program that is supplemented with education, support and counseling about heart failure. Directed toward relieving symptoms such as shortness of breath, decreased exercise tolerance, and extremity edema.    Expected Outcomes Improve functional capacity of life;Short term: Attendance in program 2-3 days a week with increased exercise capacity. Reported lower sodium intake. Reported increased fruit and vegetable intake. Reports medication compliance.;Short term: Daily weights obtained and reported for increase. Utilizing diuretic protocols set by physician.;Long term: Adoption of self-care skills and reduction of barriers for early signs and symptoms recognition and intervention leading to self-care maintenance.    Hypertension Yes    Intervention Provide education on lifestyle modifcations including regular physical activity/exercise, weight management, moderate sodium restriction and increased consumption of fresh fruit, vegetables, and low fat dairy, alcohol moderation, and smoking cessation.;Monitor prescription use compliance.    Expected Outcomes Short Term: Continued assessment and intervention until BP is < 140/43mm HG in hypertensive participants. < 130/61mm HG in hypertensive participants with diabetes, heart failure or chronic kidney disease.;Long Term: Maintenance of blood pressure at goal levels.    Lipids Yes    Intervention Provide education and support for participant on nutrition & aerobic/resistive exercise along with prescribed medications to achieve LDL 70mg , HDL >40mg .    Expected Outcomes Short Term: Participant states understanding of desired cholesterol values and is compliant with medications prescribed. Participant is following exercise prescription and nutrition guidelines.;Long Term: Cholesterol controlled with medications as prescribed, with individualized exercise RX and with  personalized nutrition plan. Value goals: LDL < 70mg , HDL > 40 mg.          Education:Diabetes - Individual verbal and written instruction to review signs/symptoms of diabetes, desired ranges of glucose level fasting, after meals and with exercise. Acknowledge that pre and post exercise glucose checks will be done for 3 sessions at entry of program.   Core Components/Risk Factors/Patient Goals Review:    Core Components/Risk Factors/Patient Goals at Discharge (Final Review):    ITP Comments:  ITP Comments     Row Name 01/20/24 1443 01/26/24 1027 02/01/24 1012 02/03/24 1041 03/02/24 1036   ITP Comments Virtual orientation call completed today. he has an appointment on Date: 01/26/2024  for EP eval and gym Orientation.  Documentation of diagnosis can be found in [facility] 12/16/2023 . Completed and gym orientation for cardiac rehab. Initial ITP created and sent for review to Dr. Oneil Pinal, Medical Director. First full day of exercise!  Patient was oriented to gym and equipment including functions, settings, policies, and procedures.  Patient's individual exercise prescription and treatment plan were reviewed.  All starting workloads were established based on the results of the 6 minute walk test done at initial orientation visit.  The plan for exercise progression was also introduced and progression will be customized based on patient's performance and goals. 30 Day review completed. Medical Director ITP review done, changes made as directed, and signed approval by Medical Director.    new to program 30 Day review completed. Medical Director ITP review done, changes made as directed, and signed approval by Medical Director.    Row Name 03/21/24 [phone] 03/30/24 0958 04/04/24 1112 04/18/24 0940 04/27/24 1007   ITP Comments Pt able to follow exercise prescription today. Pt stated he wasn't feeling well due to extra fluid he has and is leaving  a little early to go home and take his fluid pill.  Will  continue to monitor for progression. 30 Day review completed. Medical Director ITP review done, changes made as directed, and signed approval by Medical Director. Patient called to inform staff that he would be out for 2 weeks as he works with the [facility] to manage his fluid retention. Patient is still having fluid issues and is following with the heart failure clinic. He wishes to be on hold until Sept 15th. 30 Day review completed. Medical Director ITP review done; changes made as directed and signed approval by Medical Director.    Row Name 05/25/24 1453 06/06/24 0931         ITP Comments 30 Day review completed. Medical Director ITP review done; changes made as directed and signed approval by Medical Director. On medical hold. Patient requesting to discharge early from program at this time due to medical reasons. Patient has successfully completed 13 of 36 sessions of cardiac rehab. Informed patient to have a new referral sent to clinic if he wishes to return to the program in the future.         Comments: Early Discharge ITP

## 2024-06-06 NOTE — Progress Notes (Unsigned)
 Early Discharge Summary  Oscar [name] DOB: 1976-05-24  Oscar [name] is discharging early due to medical concerns. He completed 13 of 36 sessions.    6 Minute Walk     Row Name 01/26/24 1027         6 Minute Walk   Phase Initial     Distance 800 feet     Walk Time 6 minutes     # of Rest Breaks 0     MPH 1.52     METS 2.06     RPE 11     Perceived Dyspnea  1     VO2 Peak 7.21     Symptoms No     Resting HR 60 bpm     Resting BP 104/70     Resting Oxygen Saturation  98 %     Exercise Oxygen Saturation  during 6 min walk 97 %     Max Ex. HR 100 bpm     Max Ex. BP 118/80     2 Minute Post BP 100/70

## 2024-06-07 NOTE — Telephone Encounter (Signed)
 Bipap Titration Study done 05/30/24

## 2024-06-08 ENCOUNTER — Encounter: Payer: Self-pay | Admitting: *Deleted

## 2024-06-09 ENCOUNTER — Encounter

## 2024-06-13 ENCOUNTER — Other Ambulatory Visit
Admission: RE | Admit: 2024-06-13 | Discharge: 2024-06-13 | Disposition: A | Discharge status: Home / Self Care | Source: Ambulatory Visit | Attending: Physician Assistant | Admitting: Physician Assistant

## 2024-06-13 ENCOUNTER — Telehealth: Payer: Self-pay

## 2024-06-13 ENCOUNTER — Ambulatory Visit (HOSPITAL_BASED_OUTPATIENT_CLINIC_OR_DEPARTMENT_OTHER): Admitting: Physician Assistant

## 2024-06-13 ENCOUNTER — Other Ambulatory Visit: Payer: Self-pay | Admitting: Physician Assistant

## 2024-06-13 ENCOUNTER — Other Ambulatory Visit: Payer: Self-pay

## 2024-06-13 ENCOUNTER — Ambulatory Visit: Payer: Self-pay | Admitting: Pulmonary Disease

## 2024-06-13 ENCOUNTER — Ambulatory Visit
Admission: RE | Admit: 2024-06-13 | Discharge: 2024-06-13 | Disposition: A | Discharge status: Home / Self Care | Source: Ambulatory Visit | Attending: Family | Admitting: Family

## 2024-06-13 VITALS — BP 95/56 | HR 82 | Wt 312.0 lb

## 2024-06-13 DIAGNOSIS — I447 Left bundle-branch block, unspecified: Secondary | ICD-10-CM | POA: Insufficient documentation

## 2024-06-13 DIAGNOSIS — I5020 Unspecified systolic (congestive) heart failure: Secondary | ICD-10-CM

## 2024-06-13 DIAGNOSIS — E8770 Fluid overload, unspecified: Secondary | ICD-10-CM | POA: Insufficient documentation

## 2024-06-13 DIAGNOSIS — Z794 Long term (current) use of insulin: Secondary | ICD-10-CM | POA: Insufficient documentation

## 2024-06-13 DIAGNOSIS — G4733 Obstructive sleep apnea (adult) (pediatric): Secondary | ICD-10-CM

## 2024-06-13 DIAGNOSIS — Z9981 Dependence on supplemental oxygen: Secondary | ICD-10-CM | POA: Diagnosis not present

## 2024-06-13 DIAGNOSIS — I5023 Acute on chronic systolic (congestive) heart failure: Secondary | ICD-10-CM | POA: Diagnosis present

## 2024-06-13 DIAGNOSIS — J9612 Chronic respiratory failure with hypercapnia: Secondary | ICD-10-CM

## 2024-06-13 DIAGNOSIS — N1831 Chronic kidney disease, stage 3a: Secondary | ICD-10-CM | POA: Diagnosis not present

## 2024-06-13 DIAGNOSIS — Z95810 Presence of automatic (implantable) cardiac defibrillator: Secondary | ICD-10-CM | POA: Diagnosis not present

## 2024-06-13 DIAGNOSIS — R140 Abdominal distension (gaseous): Secondary | ICD-10-CM | POA: Insufficient documentation

## 2024-06-13 DIAGNOSIS — E1122 Type 2 diabetes mellitus with diabetic chronic kidney disease: Secondary | ICD-10-CM | POA: Insufficient documentation

## 2024-06-13 DIAGNOSIS — I480 Paroxysmal atrial fibrillation: Secondary | ICD-10-CM

## 2024-06-13 DIAGNOSIS — J4489 Other specified chronic obstructive pulmonary disease: Secondary | ICD-10-CM | POA: Insufficient documentation

## 2024-06-13 DIAGNOSIS — Z79899 Other long term (current) drug therapy: Secondary | ICD-10-CM | POA: Insufficient documentation

## 2024-06-13 DIAGNOSIS — E662 Morbid (severe) obesity with alveolar hypoventilation: Secondary | ICD-10-CM | POA: Diagnosis not present

## 2024-06-13 DIAGNOSIS — I428 Other cardiomyopathies: Secondary | ICD-10-CM

## 2024-06-13 DIAGNOSIS — I472 Ventricular tachycardia, unspecified: Secondary | ICD-10-CM | POA: Diagnosis not present

## 2024-06-13 DIAGNOSIS — I130 Hypertensive heart and chronic kidney disease with heart failure and stage 1 through stage 4 chronic kidney disease, or unspecified chronic kidney disease: Secondary | ICD-10-CM | POA: Diagnosis not present

## 2024-06-13 DIAGNOSIS — Z87891 Personal history of nicotine dependence: Secondary | ICD-10-CM | POA: Diagnosis not present

## 2024-06-13 DIAGNOSIS — E669 Obesity, unspecified: Secondary | ICD-10-CM

## 2024-06-13 DIAGNOSIS — Z8616 Personal history of COVID-19: Secondary | ICD-10-CM | POA: Diagnosis not present

## 2024-06-13 DIAGNOSIS — E66813 Obesity, class 3: Secondary | ICD-10-CM

## 2024-06-13 LAB — COMPREHENSIVE METABOLIC PANEL WITH GFR
ALT: 37 U/L (ref 0–44)
AST: 36 U/L (ref 15–41)
Albumin: 3.7 g/dL (ref 3.5–5.0)
Alkaline Phosphatase: 198 U/L — ABNORMAL HIGH (ref 38–126)
Anion gap: 10 (ref 5–15)
BUN: 44 mg/dL — ABNORMAL HIGH (ref 6–20)
CO2: 28 mmol/L (ref 22–32)
Calcium: 8.7 mg/dL — ABNORMAL LOW (ref 8.9–10.3)
Chloride: 99 mmol/L (ref 98–111)
Creatinine, Ser: 1.81 mg/dL — ABNORMAL HIGH (ref 0.61–1.24)
GFR, Estimated: 46 mL/min — ABNORMAL LOW (ref >60)
Glucose, Bld: 197 mg/dL — ABNORMAL HIGH (ref 70–99)
Potassium: 4.3 mmol/L (ref 3.5–5.1)
Sodium: 137 mmol/L (ref 135–145)
Total Bilirubin: 0.9 mg/dL (ref 0.0–1.2)
Total Protein: 8.5 g/dL — ABNORMAL HIGH (ref 6.5–8.1)

## 2024-06-13 LAB — BRAIN NATRIURETIC PEPTIDE: B Natriuretic Peptide: 485.7 pg/mL — ABNORMAL HIGH (ref 0.0–100.0)

## 2024-06-13 MED ORDER — METOLAZONE 2.5 MG PO TABS: 2.5000 mg | ORAL_TABLET | ORAL | 0 refills | Status: AC

## 2024-06-13 MED ORDER — FUROSEMIDE 10 MG/ML IJ SOLN
INTRAMUSCULAR | Status: AC
  Filled 2024-06-13: qty 8

## 2024-06-13 MED ORDER — FUROSEMIDE 10 MG/ML IJ SOLN
80.0000 mg | Freq: Once | INTRAMUSCULAR | Status: AC
  Filled 2024-06-13: qty 8

## 2024-06-13 MED ORDER — FUROSEMIDE 10 MG/ML IJ SOLN
80.0000 mg | Freq: Once | INTRAMUSCULAR | Status: AC
  Administered 2024-06-13: 80 mg via INTRAVENOUS

## 2024-06-13 MED ORDER — TORSEMIDE 20 MG PO TABS: 40.0000 mg | ORAL_TABLET | Freq: Two times a day (BID) | ORAL | 3 refills | Status: DC

## 2024-06-13 MED ORDER — POTASSIUM CHLORIDE CRYS ER 20 MEQ PO TBCR: 40.0000 meq | EXTENDED_RELEASE_TABLET | ORAL | 0 refills | Status: DC

## 2024-06-13 NOTE — Patient Instructions (Signed)
 Medication Changes:  FOR TWO DAYS ONLY Take Metolazone  2.5mg  (1 tab) daily. Please take 40meq of potassium with the Metolazaone  STOP Metoprolol   START Torsemide  40mg   two times daily starting tomorrow 06/14/24.   Lab Work:  Go over to the [facility]. Go pass the gift shop and have your blood work completed.   We will only call you if the results are abnormal or if the provider would like to make medication changes.  No news is good news.    Follow-Up in: Please follow up with the [facility] in November with Ellouise Class, FNP.   Thank you for choosing [facility].    At the [facility], you and your health needs are our priority. We have a designated team specialized in the treatment of Heart Failure. This Care Team includes your primary Heart Failure Specialized Cardiologist (physician), Advanced Practice Providers (APPs- Physician Assistants and Nurse Practitioners), and Pharmacist who all work together to provide you with the care you need, when you need it.   You may see any of the following providers on your designated Care Team at your next follow up:  Dr. Toribio Fuel Dr. Ezra Shuck Dr. Ria Commander Dr. Morene Brownie Ellouise Class, FNP Jaun Bash, RPH-CPP  Please be sure to bring in all your medications bottles to every appointment.   Need to Contact Us :  If you have any questions or concerns before your next appointment please send us  a message through [place] or call our office at [phone].    TO LEAVE A MESSAGE FOR THE NURSE SELECT OPTION 2, PLEASE LEAVE A MESSAGE INCLUDING: YOUR NAME DATE OF BIRTH CALL BACK NUMBER REASON FOR CALL**this is important as we prioritize the call backs  YOU WILL RECEIVE A CALL BACK THE SAME DAY AS LONG AS YOU CALL BEFORE 4:00 PM

## 2024-06-13 NOTE — Telephone Encounter (Signed)
 Spoke with patient for the [employer]. He will not qualify until 2026.

## 2024-06-13 NOTE — Progress Notes (Signed)
 AVAP order has been placed.

## 2024-06-13 NOTE — Progress Notes (Signed)
 [facility] Note    PCP: Antone Bottoms, MD  Primary Cardiologist: Florencio Kava, MD  HF Cardiologist: Has seen Dr. Bensimhon [facility]) and Dr. Bulah ([facility])  Chief Complaint: Acute on chronic HFrEF   HPI:  Mr Balan is a 48 y/o male with a history of OSA/OHS on BiPAP, COPD, PAF, HTN, DM2, CKD 3a, hx VT, LBBB and chronic systolic heart failure s/p Medtronic dual chamber ICD 2016. Has h/o longstanding NICM.    Reports h/o MIs but cath 2016 with no CAD CRT-D in 2016 Echo 4/16 EF 25% Echo 10/19 EF 20-25% Echo 6/21 EF < 20% Myoview 6/20 EF 11% no scar or ischemia Echo 2/24 EF < 20% high sphericity index RV mildly down. Mild MR Echo 5/24: EF reported as 30-35%, RV okay, LVIDd 8.2 cm  Saw Dr. Bulah at [facility] in 5/24.    Admitted 06/12/23 w/ ICD shocks 2/2 VT. He was loaded with IV amiodarone  then transitioned to PO.  CPX at [facility] 2/25: sub-maximal test, likely moderate to severe functional impairment with peak VO2 8.9 ml/kg/min, slope 31, RER 0.90, mixed cardiopulmonary limitation to exercise, no pulmonary limitation to exercise.  Multiple admissions over the last couple of years with acute on chronic CHF. More recently he's had increasing difficultly tolerating GDMT and is now on midodrine .  Saw Dr. Debby at [facility] 9/25. His ICD is at [facility], planning for upgrade to CRT-D next month.  Admitted in 4/25 with acute on chronic respiratory failure in setting of COVID-19 infection and acute on chronic CHF He has been followed closely in HF clinic. Recently required addition of intermittent metolazone  last month.   Here today for CHF follow-up. Feels terrible. He is short of breath at rest and has trouble sleeping d/t orthopnea. States dyspnea worsened a couple of days ago. Also notes abdominal bloating but no lower extremity edema. Home weight a few days ago was 304 lb, 312 lb in clinic today. Med list suggests he should be taking Torsemide  40 BID, he states he was only on  40 daily until he increased it to 40 BID a couple of days ago. He reports he isn't responding as well as he usually does to diuretics. Sees Dr. Florencio for follow-up tomorrow.  Having trouble tolerating his BiPAP at night, states he slept better when he had NIV. Follows with Pulmonary. Wears 3L O2 continuously.  He has some family in the area and a friend who drives him to appointment. Also has a home health aide that assists him a couple of times a week.  ROS: All systems negative except as listed in HPI, PMH and Problem List.  SH:  Social History   Socioeconomic History   Marital status: Single    Spouse name: Not on file   Number of children: 0   Years of education: Not on file   Highest education level: Some college, no degree  Occupational History   Occupation: unemployed  Tobacco Use   Smoking status: Former    Current packs/day: 0.00    Average packs/day: 1.5 packs/day for 34.0 years (51.0 ttl pk-yrs)    Types: Cigarettes    Quit date: 05/2023    Years since quitting: 1.1    Passive exposure: Past   Smokeless tobacco: Never   Tobacco comments:    Quit smoking 05/2023  Vaping Use   Vaping status: Never Used  Substance and Sexual Activity   Alcohol use: No   Drug use: No   Sexual activity: Yes  Other Topics Concern   Not on file  Social History Narrative   Live alone   Social Drivers of Health   Financial Resource Strain: Medium Risk (12/02/2023)   Overall Financial Resource Strain (CARDIA)    Difficulty of Paying Living Expenses: Somewhat hard  Food Insecurity: No Food Insecurity (12/04/2023)   Hunger Vital Sign    Worried About Running Out of Food in the Last Year: Never true    Ran Out of Food in the Last Year: Never true  Recent Concern: Food Insecurity - Food Insecurity Present (11/23/2023)   Received from [facility]   Hunger Vital Sign    Within the past 12 months, you worried that your food would run out before you got the money to buy  more.: Sometimes true    Within the past 12 months, the food you bought just didn't last and you didn't have money to get more.: Sometimes true  Transportation Needs: No Transportation Needs (12/04/2023)   PRAPARE - Administrator, Civil Service (Medical): No    Lack of Transportation (Non-Medical): No  Physical Activity: Not on file  Stress: Not on file  Social Connections: Moderately Isolated (12/04/2023)   Social Connection and Isolation Panel    Frequency of Communication with Friends and Family: More than three times a week    Frequency of Social Gatherings with Friends and Family: Once a week    Attends Religious Services: More than 4 times per year    Active Member of [employer] or Organizations: No    Attends Banker Meetings: Never    Marital Status: Never married  Intimate Partner Violence: Not At Risk (12/04/2023)   Humiliation, Afraid, Rape, and Kick questionnaire    Fear of Current or Ex-Partner: No    Emotionally Abused: No    Physically Abused: No    Sexually Abused: No    FH:  Family History  Problem Relation Age of Onset   Hypertension Mother    Congestive Heart Failure Mother    Hypertension Sister    Diabetes Sister    Pancreatitis Sister    COPD Sister    Emphysema Sister        smoked   Pancreatitis Brother    Anemia Neg Hx    Arrhythmia Neg Hx    Asthma Neg Hx    Clotting disorder Neg Hx    Fainting Neg Hx    Heart attack Neg Hx    Heart disease Neg Hx    Heart failure Neg Hx    Hyperlipidemia Neg Hx     Past Medical History:  Diagnosis Date   AICD (automatic cardioverter/defibrillator) present    Aortic atherosclerosis    Asthma    Chronic respiratory failure (HCC)    CKD (chronic kidney disease), stage III (HCC)    COPD (chronic obstructive pulmonary disease) (HCC)    Deafness in right ear    Diabetes mellitus without complication (HCC)    Dyspnea    GERD (gastroesophageal reflux disease)    Gout    HFrEF (heart failure  with reduced ejection fraction) (HCC)    a.) TTE 12/10/14: EF 25%; diff inf HK; sev LV and mod LA dil; LVH. b.) TTE 06/23/18: EF 20-25%; LVH; mild LV dil, mild BAE. c.) TTE 03/01/19: EF 15%, LVH, BAE; triv PR/TR. d.) TTE 02/27/20: EF < 20%; sev LV dil; mild MR. e.) TTE 11/23/20: EF < 20%; glob HK; sev LV dil; LVH; mild-mod BAE;  mod-sev TR, triv AR; G1DD. f.) TTE 05/14/21: EF <15%; LVH; sev LA and mild RV enlar; triv AR/PR, mild TR, mod MR.   Hiatal hernia    History of cardiac catheterization    a.) R/LHC 12/14/2009: normal coronaries. b.) R/LHC 12/11/2014: normal coronaries.   Hyperlipidemia    Hypertension    Hypoxemia    LBBB (left bundle branch block)    NICM (nonischemic cardiomyopathy; dilated cardiomyopathy) (HCC)    a.) R/LHC 12/14/2009: normal cors; LVEDP 18 mmHg, mean PA 29 mmHg, mean PCWP 31 mmHg; CO 6 L/min; CI 2.54 L/min/m. b.) TTE 12/10/2014: EF 25%. c.) R/LHC 12/11/2014: mean RA 9 mmHg, mean PA 22 mmHg, mean PCWP 22 mmHg. d.) TTE 06/23/2018: EF 20-25%. e.) TTE 03/01/2019: EF 15%. f.) TTE 02/27/2020: < 20%. g.) TTE 11/23/2020: EF <20%. h.) TTE 05/14/2021: < 15%.   NSTEMI (non-ST elevated myocardial infarction) (HCC)    a.) x 3 per patient report ---> 2012, 2014, 2016   Obesity    On supplemental oxygen by nasal cannula    a.) 2-3 L/[place] PRN   OSA on CPAP    PAF (paroxysmal atrial fibrillation) (HCC)    a.) CHA2DS2-VASc = 4 (HFrEF, HTN, prior MI, T2DM). b.) rate/rhythm maintained without pharmacologial intervention; no current anticoagulation.   Pancreatitis    PSVT (paroxysmal supraventricular tachycardia)     Current Outpatient Medications  Medication Sig Dispense Refill   albuterol  (VENTOLIN  HFA) 108 (90 Base) MCG/ACT inhaler Inhale 2 puffs into the lungs every 6 (six) hours as needed for wheezing or shortness of breath. 8.5 g 6   allopurinol  (ZYLOPRIM ) 100 MG tablet Take 100 mg by mouth daily.     amiodarone  (PACERONE ) 200 MG tablet Take 1 tablet (200 mg total) by mouth daily.      atorvastatin  (LIPITOR ) 80 MG tablet Take 1 tablet (80 mg total) by mouth at bedtime. 90 tablet 3   colchicine  0.6 MG tablet Take 0.6 mg by mouth daily.     doxycycline  (ADOXA) 100 MG tablet Take 100 mg by mouth 2 (two) times daily.     Fluticasone -Umeclidin-Vilant (TRELEGY ELLIPTA ) 200-62.5-25 MCG/ACT AEPB Inhale 1 puff into the lungs daily. 60 each 11   insulin  glargine (LANTUS ) 100 UNIT/ML injection Inject 15 Units into the skin daily.     metolazone  (ZAROXOLYN ) 2.5 MG tablet Take 1 tablet (2.5 mg total) by mouth every other day. (Patient not taking: Reported on 06/01/2024) 3 tablet 0   metolazone  (ZAROXOLYN ) 2.5 MG tablet Take 1 tablet (2.5 mg total) by mouth daily for 2 days. (Patient not taking: Reported on 06/01/2024) 2 tablet 0   midodrine  (PROAMATINE ) 5 MG tablet Take 1 tablet (5 mg total) by mouth 2 (two) times daily with a meal. Stop if SBP is >120 (Patient not taking: Reported on 06/01/2024)     NON FORMULARY Pt uses a cpap nightly     pantoprazole  (PROTONIX ) 40 MG tablet Take 1 tablet (40 mg total) by mouth daily.     potassium chloride  SA (KLOR-CON  M) 20 MEQ tablet Take 2 tablets (40 mEq total) by mouth as directed. Take 2 tabs with every dose of metolazone  15 tablet 0   torsemide  (DEMADEX ) 20 MG tablet Take 2 tablets (40 mg total) by mouth 2 (two) times daily. 120 tablet 3   Current Facility-Administered Medications  Medication Dose Route Frequency Provider Last Rate Last Admin   furosemide  (LASIX ) injection 80 mg  80 mg Intravenous Once        Vitals:  06/13/24 1134 06/13/24 1135  BP: (!) 95/56 (!) 95/56  Pulse: 84 82  SpO2: 96% 99%  Weight: (!) 312 lb (141.5 kg)     Wt Readings from Last 3 Encounters:  06/13/24 (!) 312 lb (141.5 kg)  06/01/24 (!) 307 lb 9.6 oz (139.5 kg)  05/27/24 (!) 310 lb (140.6 kg)     PHYSICAL EXAM: General:  Fatigued appearing. Looks older than stated age. Cor: JVP difficult d/t thick neck but appears elevated Regular rate & rhythm. No  murmurs. Lungs: + conversational dyspnea, breath sounds diminished Abdomen: obese, distended Extremities: trace edema, extremities are cool Neuro: alert & orientedx3. Affect pleasant   Medtronic device interrogation: no VT, VF, no AT/AF, 2.1 hr activity a day, unable to view additional data   ASSESSMENT & PLAN:  NICM/Acute on chronic HFrEF  - Longstanding hx of NICM. No prior cath report available - Hx AICD placement in 2016 - Echo 03/01/2019: EF 15% - Echo 10/20/22: EF <20% - CPX 2/25: sub-maximal test, likely moderate to severe functional impairment with peak VO2 8.9 ml/kg/min, slope 31, RER 0.90, mixed cardiopulmonary limitation to exercise, no pulmonary limitation to exercise - Echo 12/01/23: EF 30-35%, RV okay, mild MR, LVIDd 8.2 cm - Has LBBB. Planning for CRT-D upgrade at [facility] next month - Worse last few days, NYHA IV currently. Volume overloaded. Recently doubled his diuretics with poor response. Do worry about adherence with home medications.  - He would prefer to avoid admission if at all possible. Agrees to present to the ER tonight if symptoms worsen. Will send him for 80 mg lasix  IV at infusion center today. Resume 40 mg Torsemide  BID tomorrow. Take 2.5 mg metolazone  today and tomorrow. Take extra 40 mEq K with each dose of metolazone .  - CMET and BNP today - GDMT limited by hypotension and CKD. Not currently able to tolerate any GDMT. - Off farxiga  d/t hx groin infection, has diagnosis of hidradenitis suppurativa  - stop beta blocker with acute exacerbation/concern for low-output - Prescribed midodrine  but not taking - Worry that he may have low output HF. If he does not respond to above regimen, may require admission with inotropic support. Dr. Cherrie discussed with Dr. Florencio via phone. Dr. Florencio will see patient in clinic tomorrow and review with [facility] team.   2. HTN - BP soft in setting of systolic heart failure. Stopped metoprolol  as  above.  3. OSA/OHS - Wears 3L O2 chronically and BiPAP at night. Recently completed titration study. - Continue to follow with pulmonary  4. Diabetes (managed by PCP) - A1c 12/03/23 was 8.7% - continue lantus  15 units daily  - No SGLT2i w/ history of groin infections  5. PAF - No recent episodes on device - continue amiodarone  200mg  daily, follow thyroid  function and liver function studies - he has not wanted to take apixaban  and is aware of stroke risk - last eye exam ~ 03/25  6. VT - Hx ICD shocks for VT in 10/24 - Now on amiodarone  200  daily - No recent device discharges  7. LBBB - With wide QRS, > 170 ms - Scheduled for CRT-D upgrade at [facility] next month   Follow-up: Dr. Florencio as scheduled tomorrow, Ellouise, NP as scheduled next month, sooner if needed  [facility], MANUELITA SAILOR, PA-C 06/13/24

## 2024-06-13 NOTE — Addendum Note (Signed)
 Addended by: SHARL GRATE A on: 06/13/2024 02:28 PM   Modules accepted: Orders

## 2024-06-14 ENCOUNTER — Ambulatory Visit (HOSPITAL_COMMUNITY): Payer: Self-pay | Admitting: Physician Assistant

## 2024-06-14 NOTE — Telephone Encounter (Signed)
 DME order placed. NFN.

## 2024-06-14 NOTE — Telephone Encounter (Signed)
 The order was sent to [employer]. I received a message from [place] with [employer] [facility], Adine   This was sent in stat this moring at 902am for intake review.

## 2024-06-15 NOTE — Telephone Encounter (Signed)
 Order has been sent to [facility] as 06/14/24. I received a message from [place] with [employer] [facility], Adine   This was sent in stat this moring at 9:02am for intake review.

## 2024-06-17 ENCOUNTER — Ambulatory Visit (INDEPENDENT_AMBULATORY_CARE_PROVIDER_SITE_OTHER): Admitting: Pulmonary Disease

## 2024-06-17 ENCOUNTER — Encounter: Payer: Self-pay | Admitting: Pulmonary Disease

## 2024-06-17 VITALS — BP 100/70 | HR 80 | Ht 65.0 in | Wt 306.8 lb

## 2024-06-17 DIAGNOSIS — Z95810 Presence of automatic (implantable) cardiac defibrillator: Secondary | ICD-10-CM | POA: Diagnosis not present

## 2024-06-17 DIAGNOSIS — J4489 Other specified chronic obstructive pulmonary disease: Secondary | ICD-10-CM

## 2024-06-17 DIAGNOSIS — J449 Chronic obstructive pulmonary disease, unspecified: Secondary | ICD-10-CM

## 2024-06-17 DIAGNOSIS — J9612 Chronic respiratory failure with hypercapnia: Secondary | ICD-10-CM

## 2024-06-17 DIAGNOSIS — I509 Heart failure, unspecified: Secondary | ICD-10-CM | POA: Diagnosis not present

## 2024-06-17 DIAGNOSIS — R0602 Shortness of breath: Secondary | ICD-10-CM

## 2024-06-17 DIAGNOSIS — J9611 Chronic respiratory failure with hypoxia: Secondary | ICD-10-CM | POA: Diagnosis not present

## 2024-06-17 DIAGNOSIS — I5022 Chronic systolic (congestive) heart failure: Secondary | ICD-10-CM

## 2024-06-17 DIAGNOSIS — E669 Obesity, unspecified: Secondary | ICD-10-CM

## 2024-06-17 DIAGNOSIS — N1832 Chronic kidney disease, stage 3b: Secondary | ICD-10-CM

## 2024-06-17 DIAGNOSIS — E662 Morbid (severe) obesity with alveolar hypoventilation: Secondary | ICD-10-CM

## 2024-06-17 DIAGNOSIS — Z87891 Personal history of nicotine dependence: Secondary | ICD-10-CM

## 2024-06-17 NOTE — Progress Notes (Unsigned)
 Subjective:    Patient ID: Oscar [name], male    DOB: 1976/01/29, 48 y.o.   MRN: [id number]  Patient Care Team: Ernie Yancy Roof, MD as PCP - General (Family Medicine) Donette Ellouise LABOR, FNP as Nurse Practitioner (Cardiology) Florencio Cara BIRCH, MD as Consulting Physician (Cardiology)  Chief Complaint  Patient presents with   COPD    Little SOB. Little wheezing. Cough with green/yellow/brown sputum. Albuterol  once a day. Trelegy once a day, helps with his breathing.     BACKGROUND/INTERVAL:Patient is a 48 year old morbidly obese former smoker whom I initially evaluated on August 2023. He presents today for follow-up of multiple chronic issues to include COPD, severe obstructive sleep apnea, obesity with alveolar hypoventilation and chronic systolic heart failure to enumerate a few.  He was last seen on 28 April 2024.  This is a scheduled visit.  HPI Discussed the use of AI scribe software for clinical note transcription with the patient, who gave verbal consent to proceed.  History of Present Illness   Oscar [name] is a 48 year old male with COPD, chronic hypoxia, respiratory failure, and cardiomyopathy who presents with issues related to oxygen therapy and fluid management.  He is currently without his portable oxygen tank because the supplier has not provided a refillable tank. He is considering a portable oxygen concentrator, and an order has been placed for it.  He has been experiencing fluid buildup in his chest, which he attributes to congestive heart failure. Previously, he used a ventilator to manage this condition effectively, preventing fluid accumulation during sleep. However, his insurance company replaced the ventilator with a BiPAP machine, which he finds insufficient. He has been hospitalized multiple times over the past two to three weeks due to fluid buildup and has required frequent use of Lasix .  He mentions that the BiPAP machine does not provide the  same level of support as the ventilator. He has been told by his provider that the ventilator model he used is no longer available, and that a new machine,AVAPS, is intended to offer similar support.  He is scheduled to have his defibrillator replaced on November 12th and plans to receive his flu shot and other vaccinations after the surgery.      DATA 01/26/2015 sleep study [facility]: Severe sleep apnea AHI 49.9/h, REM related AHI 81.6/h desat to 53.6% during sleep.  Patient titrated to CPAP of 14 cm H2O. 05/08/2016 repeat sleep study: Severe sleep apnea, AHI of 73.1/h, O2 desats to 69% eventually titrated to non- invasive ventilation (Trilogy vent). 11/23/2020 echocardiogram: LVEF less than 20%.  Left ventricular function severely depressed.  Grade 1 DD, moderately reduced right ventricular systolic function, moderately elevated pulmonary artery systolic pressure biatrial enlargement.  Mild MR. 10/20/2022 echocardiogram: LVEF less than 20%, global hypokinesis of the left ventricle, moderately dilated left ventricle.  Right ventricular systolic function is normal, right ventricular size is normal mildly elevated pulmonary artery systolic pressure.  Mild mitral regurgitation. 12/01/2023 echocardiogram: LVEF 30 to 35%, moderate decreased function of the LV.  LV demonstrates regional wall motion abnormalities.  LV cavity size moderately dilated.  LV diastolic parameters indeterminate.  RV systolic function normal.  RV size normal.  Mild mitral regurgitation.  No aortic valve abnormalities.  Review of Systems A 10 point review of systems was performed and it is as noted above otherwise negative.   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 with body mass index (BMI) of 50.0 to 59.9 in adult [facility]) 12/05/2023   Uncontrolled type 2 diabetes mellitus with hyperglycemia, with long-term current use of insulin   (HCC) 12/05/2023   Acute on chronic respiratory failure with hypoxemia (HCC) 12/05/2023   COVID-19 virus infection 12/04/2023   COVID 12/04/2023   CHF exacerbation (HCC) 12/01/2023   Acute on chronic respiratory failure with hypoxia (HCC) 12/01/2023   Abscess and cellulitis of gluteal region 07/02/2023   Ventricular tachycardia (HCC) 06/12/2023   Demand ischemia (HCC) 06/12/2023   Colon cancer screening    Polyp of sigmoid colon    Idiopathic gout, unspecified site 09/26/2021   Implantable cardioverter-defibrillator (ICD) in situ 09/26/2021   Chronic kidney disease, stage 3a (HCC) 09/26/2021   Hyperlipidemia, unspecified 09/26/2021   Perineal abscess 12/12/2020   Flash pulmonary edema (HCC)    Pulmonary edema 11/25/2020   Obesity, Class III, BMI 40-49.9 (morbid obesity) (HCC) 11/24/2020   Acute on chronic diastolic (congestive) heart failure (HCC) 11/24/2020   Acute exacerbation of CHF (congestive heart failure) (HCC) 11/23/2020   Hepatic cirrhosis, unspecified hepatic cirrhosis type, unspecified whether ascites present (HCC) 11/23/2020   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) 11/23/2020   Obesity hypoventilation syndrome (HCC) 11/23/2020   Atrial fibrillation with RVR (HCC) 11/23/2020   Acute renal failure superimposed on stage 3a chronic kidney disease (HCC) 03/28/2020   Acute on chronic combined systolic (congestive) and diastolic (congestive) heart failure (HCC) 02/27/2020   COPD with acute exacerbation (HCC) 02/27/2020   Sprain of foot 01/26/2020   Foreign body granuloma of skin and subcutaneous tissue 05/26/2019   Acute pancreatitis 05/19/2017   Pancreatitis 09/12/2016   Bronchitis 05/20/2016   Constipation 11/08/2015   Abdominal pain 11/02/2015   Obstructive sleep apnea 07/31/2015   Tobacco use 07/31/2015   Diabetes (HCC) 07/10/2015   Chronic systolic heart failure (HCC) 03/30/2015   Cardiac pacemaker 03/30/2015   Chronic  respiratory failure with hypercapnia (HCC) 12/12/2014   Elevated troponin    Non-ischemic cardiomyopathy (HCC)    Hypoxemia    Acute combined systolic and diastolic heart failure (HCC)    SVT (supraventricular tachycardia) 12/09/2014   NSTEMI (non-ST elevated myocardial infarction) (HCC) 12/09/2014   Hypertension 12/09/2014    Social History   Tobacco Use   Smoking status: Former    Current packs/day: 0.00    Average packs/day: 1.5 packs/day for 34.0 years (51.0 ttl pk-yrs)    Types: Cigarettes    Quit date: 05/2023    Years since quitting: 1.1    Passive exposure: Past   Smokeless tobacco: Never   Tobacco comments:    Quit smoking 05/2023  Substance Use Topics   Alcohol use: No    Allergies  Allergen Reactions   Ciprofloxacin  Swelling and Other (See Comments)    Migraine Headache    Iodinated Contrast Media     Other reaction(s): Vomiting Projectile vomiting   Isosorb Dinitrate-Hydralazine  Other (See Comments)    Migraine Headache    Current Meds  Medication Sig   albuterol  (VENTOLIN  HFA) 108 (90 Base) MCG/ACT inhaler Inhale 2 puffs into the lungs every 6 (six) hours as needed for wheezing or shortness of breath.   allopurinol  (ZYLOPRIM ) 100 MG tablet Take 100 mg by mouth daily.   amiodarone  (PACERONE ) 200 MG tablet Take 1 tablet (200 mg total) by mouth daily.   atorvastatin  (LIPITOR ) 80 MG tablet Take 1 tablet (80 mg total) by mouth at bedtime.   colchicine  0.6  MG tablet Take 0.6 mg by mouth daily.   doxycycline  (ADOXA) 100 MG tablet Take 100 mg by mouth 2 (two) times daily.   FARXIGA  10 MG TABS tablet Take 10 mg by mouth daily.   Fluticasone -Umeclidin-Vilant (TRELEGY ELLIPTA ) 200-62.5-25 MCG/ACT AEPB Inhale 1 puff into the lungs daily.   insulin  glargine (LANTUS ) 100 UNIT/ML injection Inject 15 Units into the skin daily.   metolazone  (ZAROXOLYN ) 2.5 MG tablet Take 1 tablet (2.5 mg total) by mouth as directed.   midodrine  (PROAMATINE ) 5 MG tablet Take 1 tablet (5  mg total) by mouth 2 (two) times daily with a meal. Stop if SBP is >120   NON FORMULARY Pt uses a cpap nightly   pantoprazole  (PROTONIX ) 40 MG tablet Take 1 tablet (40 mg total) by mouth daily.   potassium chloride  SA (KLOR-CON  M) 20 MEQ tablet Take 2 tablets (40 mEq total) by mouth as directed. Take 2 tabs with every dose of metolazone    torsemide  (DEMADEX ) 20 MG tablet Take 80 mg by mouth once.   [DISCONTINUED] torsemide  (DEMADEX ) 20 MG tablet Take 2 tablets (40 mg total) by mouth 2 (two) times daily.   Current Facility-Administered Medications for the 06/17/24 encounter (Office Visit) with Tamea Dedra CROME, MD  Medication   furosemide  (LASIX ) injection 80 mg    Immunization History  Administered Date(s) Administered   Influenza-Unspecified 03/02/2023   Moderna Sars-Covid-2 Vaccination 12/31/2019, 02/04/2020        Objective:     BP 100/70   Pulse 80   Ht 5' 5 (1.651 m)   Wt (!) 306 lb 12.8 oz (139.2 kg)   SpO2 96%   BMI 51.05 kg/m   SpO2: 96 %  GENERAL: Morbidly obese man, no acute distress.  Appears older than stated age.  No conversational dyspnea, fully ambulatory. HEAD: Normocephalic, atraumatic.  EYES: Pupils equal, round, reactive to light.  No scleral icterus.  MOUTH: Oral mucosa moist.  No thrush.  Mallampati class IV .  Some missing teeth. NECK: Supple. No thyromegaly. Trachea midline. No JVD.  No adenopathy. PULMONARY: Good air entry bilaterally.  Coarse, otherwise, no adventitious sounds. CARDIOVASCULAR: S1 and S2. Regular rate and rhythm.  Grade 1-2/6 systolic ejection murmur left sternal border.  ICD present left chest. ABDOMEN: Obese, otherwise benign. MUSCULOSKELETAL: No joint deformity, no clubbing, no edema.  NEUROLOGIC: Neuro grossly normal. SKIN: Intact,warm,dry. PSYCH: Mood and behavior normal.       AVAPS ordered 10/13, DME is [facility].     Assessment & Plan:     ICD-10-CM   1. COPD with asthma (HCC)  J44.89 Pulmonary function test     AMB REFERRAL FOR DME    2. Chronic respiratory failure with hypercapnia (HCC)  J96.12     3. Shortness of breath  R06.02 Pulmonary function test    AMB REFERRAL FOR DME    4. Extreme obesity with alveolar hypoventilation (HCC)  E66.2 AMB REFERRAL FOR DME    5. Stage 3b chronic kidney disease (HCC)  N18.32     6. Chronic systolic heart failure (HCC)  P49.77       Orders Placed This Encounter  Procedures   AMB REFERRAL FOR DME    Referral Priority:   Urgent    Referral Type:   Durable Medical Equipment Purchase    Number of Visits Requested:   1   Discussion:    Chronic obstructive pulmonary disease (COPD) with chronic respiratory failure and hypoxia Chronic respiratory failure and hypoxia secondary to COPD.  Current BiPAP settings are insufficient for effective management. requires AVAPS due to its enhanced capabilities compared to BiPAP. - Ordered AVAPS with settings similar to previous ventilator - Coordinated with [employer] for AVAPS delivery - Ordered portable oxygen concentrator  Congestive heart failure with recurrent fluid overload Recurrent fluid overload due to congestive heart failure. Previous ventilator use was effective in preventing fluid accumulation during sleep. Current BiPAP is inadequate, necessitating AVAPS for better management. - Ordered AVAPS with settings similar to previous ventilator  Implantable cardioverter-defibrillator (ICD) in situ ICD in situ with upcoming replacement scheduled for November 12th. He plans to receive flu shot post-surgery. - Plan for flu vaccine post-ICD replacement surgery     Advised if symptoms do not improve or worsen, to please contact office for sooner follow up or seek emergency care.    I spent 33 minutes of dedicated to the care of this patient on the date of this encounter to include pre-visit review of records, face-to-face time with the patient discussing conditions above, post visit ordering of testing, clinical  documentation with the electronic health record, making appropriate referrals as documented, and communicating necessary findings to members of the patients care team.     C. Leita Sanders, MD Advanced Bronchoscopy P[facility]    *This note was generated using voice recognition software/Dragon and/or AI transcription program.  Despite best efforts to proofread, errors can occur which can change the meaning. Any transcriptional errors that result from this process are unintentional and may not be fully corrected at the time of dictation.

## 2024-06-17 NOTE — Patient Instructions (Signed)
 VISIT SUMMARY:  During your visit, we discussed your ongoing issues with COPD, chronic hypoxia, respiratory failure, and cardiomyopathy, focusing on your oxygen therapy and fluid management. We also reviewed your upcoming defibrillator replacement surgery and plans for vaccinations.  YOUR PLAN:  -CHRONIC OBSTRUCTIVE PULMONARY DISEASE (COPD) WITH CHRONIC RESPIRATORY FAILURE AND HYPOXIA: COPD is a chronic lung disease that makes it hard to breathe and can lead to respiratory failure and low oxygen levels. Your current BiPAP machine is not providing enough support, so we have ordered an AVAPS machine, which offers better capabilities. We have also coordinated with [employer] for the delivery of the AVAPS and ordered a portable oxygen concentrator for you.  -CONGESTIVE HEART FAILURE WITH RECURRENT FLUID OVERLOAD: Congestive heart failure occurs when the heart cannot pump blood effectively, leading to fluid buildup. Your previous ventilator helped prevent fluid accumulation during sleep, but the current BiPAP is inadequate. We have ordered an AVAPS machine to better manage your condition.  -IMPLANTABLE CARDIOVERTER-DEFIBRILLATOR (ICD) IN SITU: An ICD is a device implanted in your chest to help regulate heart rhythms. Your ICD replacement is scheduled for November 12th, and you plan to receive your flu shot after the surgery.  INSTRUCTIONS:  Please follow up with us  after your ICD replacement surgery to receive your flu shot and other vaccinations. Ensure you coordinate with [employer] for the delivery of your new AVAPS machine and portable oxygen concentrator.

## 2024-06-22 NOTE — Telephone Encounter (Signed)
 Copied from CRM #[id number]. Topic: Clinical - Order For Equipment >> Jun 03, 2024 12:48 PM Rozanna MATSU wrote: Reason for CRM: Pt called stated he needs a new order to get the vent stated [employer] [facility] is needing this information. Pt is on Bipap machine and wants to switch back the Ventilator. Stated they are needing the home sleep results. >> Jun 22, 2024  4:02 PM Russell PARAS wrote: Pt contacting clinic regarding order for POC that was sent to [employer]. He spoke with them today and they are not showing the order in their system.   Pt requesting order be refaxed to [employer]  CB#  [phone]

## 2024-06-24 NOTE — Telephone Encounter (Signed)
-----   Message from Donzell ORN sent at 06/23/2024  4:28 PM EDT ----- Message from Henrietta with [employer] Jackson Avelina Vannie Donzell GLADSTONE Joylene Carlean Jackson Avelina; Tucker, Dolanda; Cain, Mitchell; 1 other Order was created on 06/17/2024. I have asked the [place] branch to please follow up. Their direct number is [phone] If he would like to also reach out.       ----- Message ----- From: Lang Exie PARAS, CMA Sent: 06/22/2024   4:15 PM EDT To: Donzell GORMAN Vannie; [facility] Triage  ----- Message from Exie PARAS Lang, CMA sent at 06/22/2024  4:15 PM EDT -----  [employer] seeking order and notes to be re-sent for POC oder placed 10/17 and was confirmed as received by [employer] on 10/17 by Ephraim Dollar.

## 2024-06-24 NOTE — Telephone Encounter (Addendum)
 I spoke with Oscar [name] with [employer], he said the patient is currently on the strongest BiPAP they offer. If you are wanting to transition him to a AVAPS  he will have to have a qualifying ABG with a PCO2 of 52 or greater. He said the last ABG was 3 years ago and it would not qualify him for the AVAPS.

## 2024-06-27 NOTE — Progress Notes (Signed)
 Patient advised he will need ABG, to qualify for VENT.

## 2024-06-27 NOTE — Progress Notes (Signed)
 His titration study recently done was for AVAPS.  We can certainly order arterial blood gas cardiopulmonary.

## 2024-07-05 ENCOUNTER — Ambulatory Visit

## 2024-07-10 NOTE — Progress Notes (Deleted)
 Advanced Heart Failure Clinic Note    PCP: Antone Bottoms, MD  Primary Cardiologist: Florencio Kava, MD (last seen 10/25) HF Cardiologist: Has seen Dr. Bensimhon [facility]) and Dr. Devore ([facility])  Chief Complaint:    HPI:  [name] is a 48 y/o male with a history of OSA/OHS on BiPAP, COPD, PAF, HTN, DM2, CKD 3a, hx VT, LBBB and chronic systolic heart failure s/p Medtronic dual chamber ICD 2016. Has h/o longstanding NICM.    Reports h/o MIs but cath 2016 with no CAD CRT-D in 2016 Echo 4/16 EF 25% Echo 10/19 EF 20-25% Echo 6/21 EF < 20% Myoview 6/20 EF 11% no scar or ischemia Echo 2/24 EF < 20% high sphericity index RV mildly down. Mild MR Echo 5/24: EF reported as 30-35%, RV okay, LVIDd 8.2 cm  Saw Dr. Bulah at [facility] in 5/24.    Admitted 06/12/23 w/ ICD shocks 2/2 VT. He was loaded with IV amiodarone  then transitioned to PO.  CPX at [facility] 2/25: sub-maximal test, likely moderate to severe functional impairment with peak VO2 8.9 ml/kg/min, slope 31, RER 0.90, mixed cardiopulmonary limitation to exercise, no pulmonary limitation to exercise.  Multiple admissions over the last couple of years with acute on chronic CHF. More recently he's had increasing difficultly tolerating GDMT and is now on midodrine .  Saw Dr. Debby at [facility] 9/25. His ICD is at [facility], planning for upgrade to CRT-D next month.  Admitted in 4/25 with acute on chronic respiratory failure in setting of COVID-19 infection and acute on chronic CHF He has been followed closely in HF clinic. Recently required addition of intermittent metolazone  last month.   He presents today for a HF follow-up visit with a chief complaint of   Having trouble tolerating his BiPAP at night, states he slept better when he had NIV. Follows with Pulmonary. Wears 3L O2 continuously.    ROS: All systems negative except as listed in HPI, PMH and Problem List.  SH:  Social History   Socioeconomic History   Marital status: Single     Spouse name: Not on file   Number of children: 0   Years of education: Not on file   Highest education level: Some college, no degree  Occupational History   Occupation: unemployed  Tobacco Use   Smoking status: Former    Current packs/day: 0.00    Average packs/day: 1.5 packs/day for 34.0 years (51.0 ttl pk-yrs)    Types: Cigarettes    Quit date: 05/2023    Years since quitting: 1.1    Passive exposure: Past   Smokeless tobacco: Never   Tobacco comments:    Quit smoking 05/2023  Vaping Use   Vaping status: Never Used  Substance and Sexual Activity   Alcohol use: No   Drug use: No   Sexual activity: Yes  Other Topics Concern   Not on file  Social History Narrative   Live alone   Social Drivers of Health   Financial Resource Strain: Medium Risk (12/02/2023)   Overall Financial Resource Strain (CARDIA)    Difficulty of Paying Living Expenses: Somewhat hard  Food Insecurity: No Food Insecurity (12/04/2023)   Hunger Vital Sign    Worried About Running Out of Food in the Last Year: Never true    Ran Out of Food in the Last Year: Never true  Recent Concern: Food Insecurity - Food Insecurity Present (11/23/2023)   Received from [facility]   Hunger Vital Sign    Within the past  12 months, you worried that your food would run out before you got the money to buy more.: Sometimes true    Within the past 12 months, the food you bought just didn't last and you didn't have money to get more.: Sometimes true  Transportation Needs: No Transportation Needs (12/04/2023)   PRAPARE - Administrator, Civil Service (Medical): No    Lack of Transportation (Non-Medical): No  Physical Activity: Not on file  Stress: Not on file  Social Connections: Moderately Isolated (12/04/2023)   Social Connection and Isolation Panel    Frequency of Communication with Friends and Family: More than three times a week    Frequency of Social Gatherings with Friends and Family: Once a week     Attends Religious Services: More than 4 times per year    Active Member of [employer] or Organizations: No    Attends Banker Meetings: Never    Marital Status: Never married  Intimate Partner Violence: Not At Risk (12/04/2023)   Humiliation, Afraid, Rape, and Kick questionnaire    Fear of Current or Ex-Partner: No    Emotionally Abused: No    Physically Abused: No    Sexually Abused: No    FH:  Family History  Problem Relation Age of Onset   Hypertension Mother    Congestive Heart Failure Mother    Hypertension Sister    Diabetes Sister    Pancreatitis Sister    COPD Sister    Emphysema Sister        smoked   Pancreatitis Brother    Anemia Neg Hx    Arrhythmia Neg Hx    Asthma Neg Hx    Clotting disorder Neg Hx    Fainting Neg Hx    Heart attack Neg Hx    Heart disease Neg Hx    Heart failure Neg Hx    Hyperlipidemia Neg Hx     Past Medical History:  Diagnosis Date   AICD (automatic cardioverter/defibrillator) present    Aortic atherosclerosis    Asthma    Chronic respiratory failure (HCC)    CKD (chronic kidney disease), stage III (HCC)    COPD (chronic obstructive pulmonary disease) (HCC)    Deafness in right ear    Diabetes mellitus without complication (HCC)    Dyspnea    GERD (gastroesophageal reflux disease)    Gout    HFrEF (heart failure with reduced ejection fraction) (HCC)    a.) TTE 12/10/14: EF 25%; diff inf HK; sev LV and mod LA dil; LVH. b.) TTE 06/23/18: EF 20-25%; LVH; mild LV dil, mild BAE. c.) TTE 03/01/19: EF 15%, LVH, BAE; triv PR/TR. d.) TTE 02/27/20: EF < 20%; sev LV dil; mild MR. e.) TTE 11/23/20: EF < 20%; glob HK; sev LV dil; LVH; mild-mod BAE; mod-sev TR, triv AR; G1DD. f.) TTE 05/14/21: EF <15%; LVH; sev LA and mild RV enlar; triv AR/PR, mild TR, mod MR.   Hiatal hernia    History of cardiac catheterization    a.) R/LHC 12/14/2009: normal coronaries. b.) R/LHC 12/11/2014: normal coronaries.   Hyperlipidemia    Hypertension     Hypoxemia    LBBB (left bundle branch block)    NICM (nonischemic cardiomyopathy; dilated cardiomyopathy) (HCC)    a.) R/LHC 12/14/2009: normal cors; LVEDP 18 mmHg, mean PA 29 mmHg, mean PCWP 31 mmHg; CO 6 L/min; CI 2.54 L/min/m. b.) TTE 12/10/2014: EF 25%. c.) R/LHC 12/11/2014: mean RA 9 mmHg, mean PA 22 mmHg,  mean PCWP 22 mmHg. d.) TTE 06/23/2018: EF 20-25%. e.) TTE 03/01/2019: EF 15%. f.) TTE 02/27/2020: < 20%. g.) TTE 11/23/2020: EF <20%. h.) TTE 05/14/2021: < 15%.   NSTEMI (non-ST elevated myocardial infarction) (HCC)    a.) x 3 per patient report ---> 2012, 2014, 2016   Obesity    On supplemental oxygen by nasal cannula    a.) 2-3 L/[place] PRN   OSA on CPAP    PAF (paroxysmal atrial fibrillation) (HCC)    a.) CHA2DS2-VASc = 4 (HFrEF, HTN, prior MI, T2DM). b.) rate/rhythm maintained without pharmacologial intervention; no current anticoagulation.   Pancreatitis    PSVT (paroxysmal supraventricular tachycardia)     Current Outpatient Medications  Medication Sig Dispense Refill   albuterol  (VENTOLIN  HFA) 108 (90 Base) MCG/ACT inhaler Inhale 2 puffs into the lungs every 6 (six) hours as needed for wheezing or shortness of breath. 8.5 g 6   allopurinol  (ZYLOPRIM ) 100 MG tablet Take 100 mg by mouth daily.     amiodarone  (PACERONE ) 200 MG tablet Take 1 tablet (200 mg total) by mouth daily.     atorvastatin  (LIPITOR ) 80 MG tablet Take 1 tablet (80 mg total) by mouth at bedtime. 90 tablet 3   colchicine  0.6 MG tablet Take 0.6 mg by mouth daily.     doxycycline  (ADOXA) 100 MG tablet Take 100 mg by mouth 2 (two) times daily.     FARXIGA  10 MG TABS tablet Take 10 mg by mouth daily.     Fluticasone -Umeclidin-Vilant (TRELEGY ELLIPTA ) 200-62.5-25 MCG/ACT AEPB Inhale 1 puff into the lungs daily. 60 each 11   insulin  glargine (LANTUS ) 100 UNIT/ML injection Inject 15 Units into the skin daily.     metolazone  (ZAROXOLYN ) 2.5 MG tablet Take 1 tablet (2.5 mg total) by mouth every other day. (Patient not  taking: Reported on 06/17/2024) 3 tablet 0   metolazone  (ZAROXOLYN ) 2.5 MG tablet Take 1 tablet (2.5 mg total) by mouth as directed. 5 tablet 0   midodrine  (PROAMATINE ) 5 MG tablet Take 1 tablet (5 mg total) by mouth 2 (two) times daily with a meal. Stop if SBP is >120     NON FORMULARY Pt uses a cpap nightly     pantoprazole  (PROTONIX ) 40 MG tablet Take 1 tablet (40 mg total) by mouth daily.     potassium chloride  SA (KLOR-CON  M) 20 MEQ tablet Take 2 tablets (40 mEq total) by mouth as directed. Take 2 tabs with every dose of metolazone  15 tablet 0   torsemide  (DEMADEX ) 20 MG tablet Take 80 mg by mouth once.     Current Facility-Administered Medications  Medication Dose Route Frequency Provider Last Rate Last Admin   furosemide  (LASIX ) injection 80 mg  80 mg Intravenous Once            PHYSICAL EXAM: General:  Fatigued appearing. Looks older than stated age. Cor: JVP difficult d/t thick neck but appears elevated Regular rate & rhythm. No murmurs. Lungs: + conversational dyspnea, breath sounds diminished Abdomen: obese, distended Extremities: trace edema, extremities are cool Neuro: alert & orientedx3. Affect pleasant   Medtronic device interrogation: no VT, VF, no AT/AF, 2.1 hr activity a day, unable to view additional data   ASSESSMENT & PLAN:  NICM/Acute on chronic HFrEF  - Longstanding hx of NICM. No prior cath report available - Hx AICD placement in 2016 - Echo 03/01/2019: EF 15% - Echo 10/20/22: EF <20% - CPX 2/25: sub-maximal test, likely moderate to severe functional impairment with peak VO2  8.9 ml/kg/min, slope 31, RER 0.90, mixed cardiopulmonary limitation to exercise, no pulmonary limitation to exercise - Echo 12/01/23: EF 30-35%, RV okay, mild MR, LVIDd 8.2 cm - NYHA class - euvolemic - weight 312 pounds from last visit here 1 month ago - Has LBBB. Planning for CRT-D upgrade at [facility] next month - GDMT limited by hypotension and CKD. Not currently able to tolerate any  GDMT. - Off farxiga  d/t hx groin infection, has diagnosis of hidradenitis suppurativa  - stop beta blocker with acute exacerbation/concern for low-output - Prescribed midodrine  but not taking - BNP 06/13/24 was 485.7  2. HTN - BP  - BMET 06/13/24 reviewed: sodium 135, potassium 4.3, creatinine 1.81, GFR 46  3. OSA/OHS - Wears 3L O2 chronically and BiPAP at night. Recently completed titration study. - Continue to follow with pulmonary. Last seen 10/25  4. Diabetes (managed by PCP) - A1c 06/01/24 was 8.7% - continue lantus  15 units daily  - No SGLT2i w/ history of groin infections  5. PAF - No recent episodes on device - continue amiodarone  200mg  daily, follow thyroid  function and liver function studies - he has not wanted to take apixaban  and is aware of stroke risk - last eye exam ~ 03/25 - saw cardiology Philippe) 10/25  6. VT - Hx ICD shocks for VT in 10/24 - Now on amiodarone  200  daily - No recent device discharges  7. LBBB - With wide QRS, > 170 ms - Scheduled for CRT-D upgrade at [facility] next month   Ellouise DELENA Class, [place] 07/10/24

## 2024-07-11 ENCOUNTER — Encounter: Admitting: Family

## 2024-07-11 ENCOUNTER — Ambulatory Visit: Attending: Family | Admitting: Family

## 2024-07-11 ENCOUNTER — Encounter: Payer: Self-pay | Admitting: Family

## 2024-07-11 VITALS — BP 98/74 | HR 80 | Wt 312.0 lb

## 2024-07-11 DIAGNOSIS — I472 Ventricular tachycardia, unspecified: Secondary | ICD-10-CM | POA: Insufficient documentation

## 2024-07-11 DIAGNOSIS — R140 Abdominal distension (gaseous): Secondary | ICD-10-CM | POA: Insufficient documentation

## 2024-07-11 DIAGNOSIS — N1831 Chronic kidney disease, stage 3a: Secondary | ICD-10-CM | POA: Diagnosis not present

## 2024-07-11 DIAGNOSIS — Z95810 Presence of automatic (implantable) cardiac defibrillator: Secondary | ICD-10-CM | POA: Insufficient documentation

## 2024-07-11 DIAGNOSIS — I447 Left bundle-branch block, unspecified: Secondary | ICD-10-CM | POA: Insufficient documentation

## 2024-07-11 DIAGNOSIS — R0602 Shortness of breath: Secondary | ICD-10-CM | POA: Diagnosis present

## 2024-07-11 DIAGNOSIS — I5022 Chronic systolic (congestive) heart failure: Secondary | ICD-10-CM | POA: Insufficient documentation

## 2024-07-11 DIAGNOSIS — Z79899 Other long term (current) drug therapy: Secondary | ICD-10-CM | POA: Diagnosis not present

## 2024-07-11 DIAGNOSIS — J449 Chronic obstructive pulmonary disease, unspecified: Secondary | ICD-10-CM | POA: Diagnosis not present

## 2024-07-11 DIAGNOSIS — I130 Hypertensive heart and chronic kidney disease with heart failure and stage 1 through stage 4 chronic kidney disease, or unspecified chronic kidney disease: Secondary | ICD-10-CM | POA: Diagnosis not present

## 2024-07-11 DIAGNOSIS — R600 Localized edema: Secondary | ICD-10-CM | POA: Diagnosis not present

## 2024-07-11 DIAGNOSIS — I428 Other cardiomyopathies: Secondary | ICD-10-CM | POA: Diagnosis not present

## 2024-07-11 DIAGNOSIS — G4733 Obstructive sleep apnea (adult) (pediatric): Secondary | ICD-10-CM | POA: Insufficient documentation

## 2024-07-11 DIAGNOSIS — R5383 Other fatigue: Secondary | ICD-10-CM | POA: Insufficient documentation

## 2024-07-11 DIAGNOSIS — E119 Type 2 diabetes mellitus without complications: Secondary | ICD-10-CM

## 2024-07-11 DIAGNOSIS — I480 Paroxysmal atrial fibrillation: Secondary | ICD-10-CM | POA: Insufficient documentation

## 2024-07-11 DIAGNOSIS — Z9981 Dependence on supplemental oxygen: Secondary | ICD-10-CM | POA: Insufficient documentation

## 2024-07-11 DIAGNOSIS — I10 Essential (primary) hypertension: Secondary | ICD-10-CM | POA: Diagnosis not present

## 2024-07-11 DIAGNOSIS — Z794 Long term (current) use of insulin: Secondary | ICD-10-CM | POA: Insufficient documentation

## 2024-07-11 DIAGNOSIS — E1122 Type 2 diabetes mellitus with diabetic chronic kidney disease: Secondary | ICD-10-CM | POA: Diagnosis not present

## 2024-07-11 NOTE — Patient Instructions (Signed)
 It was good to see you today!

## 2024-07-11 NOTE — Progress Notes (Signed)
 Advanced Heart Failure Clinic Note    PCP: Antone Bottoms, MD  Primary Cardiologist: Florencio Kava, MD (last seen 10/25) HF Cardiologist: Has seen Dr. Bensimhon [facility]) and Dr. Devore ([facility])  Chief Complaint: shortness of breath/ feels fluid up   HPI:  Oscar [name] is a 48 y/o male with a history of OSA/OHS on BiPAP, COPD, PAF, HTN, DM2, CKD 3a, hx VT, LBBB and chronic systolic heart failure s/p Medtronic dual chamber ICD 2016. Has h/o longstanding NICM.    Reports h/o MIs but cath 2016 with no CAD CRT-D in 2016 Echo 4/16 EF 25% Echo 10/19 EF 20-25% Echo 6/21 EF < 20% Myoview 6/20 EF 11% no scar or ischemia Echo 2/24 EF < 20% high sphericity index RV mildly down. Mild Oscar Echo 5/24: EF reported as 30-35%, RV okay, LVIDd 8.2 cm  Saw Dr. Bulah at [facility] in 5/24.    Admitted 06/12/23 w/ ICD shocks 2/2 VT. He was loaded with IV amiodarone  then transitioned to PO.  CPX at [facility] 2/25: sub-maximal test, likely moderate to severe functional impairment with peak VO2 8.9 ml/kg/min, slope 31, RER 0.90, mixed cardiopulmonary limitation to exercise, no pulmonary limitation to exercise.  Multiple admissions over the last couple of years with acute on chronic CHF. More recently he's had increasing difficultly tolerating GDMT and is now on midodrine .  Saw Dr. Debby at [facility] 9/25. His ICD is at [facility], planning for upgrade to CRT-D next month.  Admitted in 4/25 with acute on chronic respiratory failure in setting of COVID-19 infection and acute on chronic CHF He has been followed closely in HF clinic. Recently required addition of intermittent metolazone  last month.   He presents today for a HF follow-up visit with a chief complaint of worsening shortness of breath. Has associated fatigue, pedal edema, abdominal distention and weight gain. Sleeping ok. Asking if he can take a dose of metolazone  today prior to his CRT-D upgrade in 2 days. .   Having trouble tolerating his BiPAP at night, states  he slept better when he had NIV. Follows with Pulmonary. Wears 3L O2 continuously.  Has seen Dr. Florencio, Dr Merline and Dr Tobie since last OV here.   ROS: All systems negative except as listed in HPI, PMH and Problem List.  SH:  Social History   Socioeconomic History   Marital status: Single    Spouse name: Not on file   Number of children: 0   Years of education: Not on file   Highest education level: Some college, no degree  Occupational History   Occupation: unemployed  Tobacco Use   Smoking status: Former    Current packs/day: 0.00    Average packs/day: 1.5 packs/day for 34.0 years (51.0 ttl pk-yrs)    Types: Cigarettes    Quit date: 05/2023    Years since quitting: 1.1    Passive exposure: Past   Smokeless tobacco: Never   Tobacco comments:    Quit smoking 05/2023  Vaping Use   Vaping status: Never Used  Substance and Sexual Activity   Alcohol use: No   Drug use: No   Sexual activity: Yes  Other Topics Concern   Not on file  Social History Narrative   Live alone   Social Drivers of Health   Financial Resource Strain: Medium Risk (12/02/2023)   Overall Financial Resource Strain (CARDIA)    Difficulty of Paying Living Expenses: Somewhat hard  Food Insecurity: No Food Insecurity (12/04/2023)   Hunger Vital Sign    Worried  About Running Out of Food in the Last Year: Never true    Ran Out of Food in the Last Year: Never true  Recent Concern: Food Insecurity - Food Insecurity Present (11/23/2023)   Received from [facility]   Hunger Vital Sign    Within the past 12 months, you worried that your food would run out before you got the money to buy more.: Sometimes true    Within the past 12 months, the food you bought just didn't last and you didn't have money to get more.: Sometimes true  Transportation Needs: No Transportation Needs (12/04/2023)   PRAPARE - Administrator, Civil Service (Medical): No    Lack of Transportation (Non-Medical):  No  Physical Activity: Not on file  Stress: Not on file  Social Connections: Moderately Isolated (12/04/2023)   Social Connection and Isolation Panel    Frequency of Communication with Friends and Family: More than three times a week    Frequency of Social Gatherings with Friends and Family: Once a week    Attends Religious Services: More than 4 times per year    Active Member of [employer] or Organizations: No    Attends Banker Meetings: Never    Marital Status: Never married  Intimate Partner Violence: Not At Risk (12/04/2023)   Humiliation, Afraid, Rape, and Kick questionnaire    Fear of Current or Ex-Partner: No    Emotionally Abused: No    Physically Abused: No    Sexually Abused: No    FH:  Family History  Problem Relation Age of Onset   Hypertension Mother    Congestive Heart Failure Mother    Hypertension Sister    Diabetes Sister    Pancreatitis Sister    COPD Sister    Emphysema Sister        smoked   Pancreatitis Brother    Anemia Neg Hx    Arrhythmia Neg Hx    Asthma Neg Hx    Clotting disorder Neg Hx    Fainting Neg Hx    Heart attack Neg Hx    Heart disease Neg Hx    Heart failure Neg Hx    Hyperlipidemia Neg Hx     Past Medical History:  Diagnosis Date   AICD (automatic cardioverter/defibrillator) present    Aortic atherosclerosis    Asthma    Chronic respiratory failure (HCC)    CKD (chronic kidney disease), stage III (HCC)    COPD (chronic obstructive pulmonary disease) (HCC)    Deafness in right ear    Diabetes mellitus without complication (HCC)    Dyspnea    GERD (gastroesophageal reflux disease)    Gout    HFrEF (heart failure with reduced ejection fraction) (HCC)    a.) TTE 12/10/14: EF 25%; diff inf HK; sev LV and mod LA dil; LVH. b.) TTE 06/23/18: EF 20-25%; LVH; mild LV dil, mild BAE. c.) TTE 03/01/19: EF 15%, LVH, BAE; triv PR/TR. d.) TTE 02/27/20: EF < 20%; sev LV dil; mild Oscar. e.) TTE 11/23/20: EF < 20%; glob HK; sev LV dil; LVH;  mild-mod BAE; mod-sev TR, triv AR; G1DD. f.) TTE 05/14/21: EF <15%; LVH; sev LA and mild RV enlar; triv AR/PR, mild TR, mod Oscar.   Hiatal hernia    History of cardiac catheterization    a.) R/LHC 12/14/2009: normal coronaries. b.) R/LHC 12/11/2014: normal coronaries.   Hyperlipidemia    Hypertension    Hypoxemia    LBBB (left  bundle branch block)    NICM (nonischemic cardiomyopathy; dilated cardiomyopathy) (HCC)    a.) R/LHC 12/14/2009: normal cors; LVEDP 18 mmHg, mean PA 29 mmHg, mean PCWP 31 mmHg; CO 6 L/min; CI 2.54 L/min/m. b.) TTE 12/10/2014: EF 25%. c.) R/LHC 12/11/2014: mean RA 9 mmHg, mean PA 22 mmHg, mean PCWP 22 mmHg. d.) TTE 06/23/2018: EF 20-25%. e.) TTE 03/01/2019: EF 15%. f.) TTE 02/27/2020: < 20%. g.) TTE 11/23/2020: EF <20%. h.) TTE 05/14/2021: < 15%.   NSTEMI (non-ST elevated myocardial infarction) (HCC)    a.) x 3 per patient report ---> 2012, 2014, 2016   Obesity    On supplemental oxygen by nasal cannula    a.) 2-3 L/[place] PRN   OSA on CPAP    PAF (paroxysmal atrial fibrillation) (HCC)    a.) CHA2DS2-VASc = 4 (HFrEF, HTN, prior MI, T2DM). b.) rate/rhythm maintained without pharmacologial intervention; no current anticoagulation.   Pancreatitis    PSVT (paroxysmal supraventricular tachycardia)     Current Outpatient Medications  Medication Sig Dispense Refill   albuterol  (VENTOLIN  HFA) 108 (90 Base) MCG/ACT inhaler Inhale 2 puffs into the lungs every 6 (six) hours as needed for wheezing or shortness of breath. 8.5 g 6   allopurinol  (ZYLOPRIM ) 100 MG tablet Take 100 mg by mouth daily.     amiodarone  (PACERONE ) 200 MG tablet Take 1 tablet (200 mg total) by mouth daily.     atorvastatin  (LIPITOR ) 80 MG tablet Take 1 tablet (80 mg total) by mouth at bedtime. 90 tablet 3   colchicine  0.6 MG tablet Take 0.6 mg by mouth daily. (Patient taking differently: Take 0.6 mg by mouth daily as needed (gout).)     doxycycline  (ADOXA) 100 MG tablet Take 100 mg by mouth 2 (two) times daily.      FARXIGA  10 MG TABS tablet Take 10 mg by mouth daily.     Fluticasone -Umeclidin-Vilant (TRELEGY ELLIPTA ) 200-62.5-25 MCG/ACT AEPB Inhale 1 puff into the lungs daily. 60 each 11   insulin  glargine (LANTUS ) 100 UNIT/ML injection Inject 15 Units into the skin daily.     midodrine  (PROAMATINE ) 5 MG tablet Take 1 tablet (5 mg total) by mouth 2 (two) times daily with a meal. Stop if SBP is >120     NON FORMULARY Pt uses a cpap nightly     torsemide  (DEMADEX ) 20 MG tablet Take 80 mg by mouth once. (Patient taking differently: Take 40 mg by mouth 2 (two) times daily.)     metolazone  (ZAROXOLYN ) 2.5 MG tablet Take 1 tablet (2.5 mg total) by mouth every other day. (Patient not taking: Reported on 07/11/2024) 3 tablet 0   metolazone  (ZAROXOLYN ) 2.5 MG tablet Take 1 tablet (2.5 mg total) by mouth as directed. (Patient not taking: Reported on 07/11/2024) 5 tablet 0   pantoprazole  (PROTONIX ) 40 MG tablet Take 1 tablet (40 mg total) by mouth daily. (Patient not taking: Reported on 07/11/2024)     potassium chloride  SA (KLOR-CON  M) 20 MEQ tablet Take 2 tablets (40 mEq total) by mouth as directed. Take 2 tabs with every dose of metolazone  (Patient not taking: Reported on 07/11/2024) 15 tablet 0   Current Facility-Administered Medications  Medication Dose Route Frequency Provider Last Rate Last Admin   furosemide  (LASIX ) injection 80 mg  80 mg Intravenous Once         Vitals:   07/11/24 1208  BP: 98/74  Pulse: 80  SpO2: 96%  Weight: (!) 312 lb (141.5 kg)   Wt Readings from Last  3 Encounters:  07/11/24 (!) 312 lb (141.5 kg)  06/17/24 (!) 306 lb 12.8 oz (139.2 kg)  06/13/24 (!) 312 lb (141.5 kg)   Lab Results  Component Value Date   CREATININE 1.81 (H) 06/13/2024   CREATININE 1.89 (H) 06/01/2024   CREATININE 1.63 (H) 05/27/2024   PHYSICAL EXAM:  General: Well appearing.  Cor: No JVD although difficult to assess due to neck size. Regular rhythm, rate.  Lungs: clear Abdomen: soft, nontender,  nondistended. Extremities: no edema Neuro:. Affect pleasant    Medtronic device interrogation: device interrogated but at time of note completion, information was not sent yet   ASSESSMENT & PLAN:  NICM with reduced ejection fraction- - Longstanding hx of NICM. No prior cath report available - Hx AICD placement in 2016 - Echo 03/01/2019: EF 15% - Echo 10/20/22: EF <20% - CPX 2/25: sub-maximal test, likely moderate to severe functional impairment with peak VO2 8.9 ml/kg/min, slope 31, RER 0.90, mixed cardiopulmonary limitation to exercise, no pulmonary limitation to exercise - Echo 12/01/23: EF 30-35%, RV okay, mild Oscar, LVIDd 8.2 cm - NYHA class III - euvolemic by exam although symptomatically he feels short of breath and reports home weight gain - ok to take 2.5mg  metolazone  X 1 - weight stable from last visit here 1 month ago - Has LBBB. Planning for CRT-D upgrade at [facility] in 2 days - GDMT limited by hypotension and CKD. Not currently able to tolerate any GDMT. - Off farxiga  d/t hx groin infection, has diagnosis of hidradenitis suppurativa  - BNP 06/13/24 was 485.7  2. HTN - BP 98/74. Denies dizziness - BMET 06/13/24 reviewed: sodium 135, potassium 4.3, creatinine 1.81, GFR 46  3. OSA/OHS - Wears 3L O2 chronically and BiPAP at night. Recently completed titration study. - Continue to follow with pulmonary. Last seen 10/25  4. Diabetes (managed by PCP) - A1c 06/01/24 was 8.7% - continue lantus  15 units daily  - No SGLT2i w/ history of groin infections  5. PAF - No recent episodes on device - continue amiodarone  200mg  daily, follow thyroid  function and liver function studies - he has not wanted to take apixaban  and is aware of stroke risk - last eye exam ~ 03/25 - saw cardiology Philippe) 10/25  6. VT - Hx ICD shocks for VT in 10/24 - Now on amiodarone  200  daily - No recent device discharges  7. LBBB - With wide QRS, > 170 ms - Scheduled for CRT-D upgrade at [facility] in 2  days   Return in 1 month, sooner if needed.   I spent 21 minutes reviewing records, interviewing/ examing patient and managing plan/ orders.   Ellouise DELENA Class, FNP 07/11/24

## 2024-07-19 ENCOUNTER — Ambulatory Visit (INDEPENDENT_AMBULATORY_CARE_PROVIDER_SITE_OTHER): Admitting: Podiatry

## 2024-07-19 DIAGNOSIS — M2041 Other hammer toe(s) (acquired), right foot: Secondary | ICD-10-CM

## 2024-07-19 DIAGNOSIS — E119 Type 2 diabetes mellitus without complications: Secondary | ICD-10-CM

## 2024-07-19 DIAGNOSIS — M79674 Pain in right toe(s): Secondary | ICD-10-CM | POA: Diagnosis not present

## 2024-07-19 DIAGNOSIS — B351 Tinea unguium: Secondary | ICD-10-CM

## 2024-07-19 DIAGNOSIS — Z794 Long term (current) use of insulin: Secondary | ICD-10-CM

## 2024-07-19 DIAGNOSIS — M2042 Other hammer toe(s) (acquired), left foot: Secondary | ICD-10-CM

## 2024-07-19 DIAGNOSIS — M79675 Pain in left toe(s): Secondary | ICD-10-CM

## 2024-07-19 MED ORDER — COLCHICINE 0.6 MG PO TABS: 0.6000 mg | ORAL_TABLET | Freq: Every day | ORAL | 0 refills | Status: AC

## 2024-07-19 NOTE — Progress Notes (Signed)
  Subjective:  Patient ID: Oscar [name], male    DOB: Sep 30, 1975,  MRN: [id number]  Chief Complaint  Patient presents with   Nail Problem    Nail trim     48 y.o. male presents with the above complaint. History confirmed with patient.  His nails are thickened and elongated and causing discomfort again, says his diabetes is well controlled  Denies tingling numbness or neuropathy symptoms he would also like to do diabetic shoes.  He states been more than a year since his previous pair  Objective:  Physical Exam: warm, good capillary refill, no trophic changes or ulcerative lesions, normal DP and PT pulses, and normal sensory exam. Left Foot: dystrophic yellowed discolored nail plates with subungual debris Right Foot: dystrophic yellowed discolored nail plates with subungual debris Bilateral hammertoe deformities noted semiflexible in nature pain on palpation no open wounds or ulceration noted at this time  Assessment:   1. Type 2 diabetes mellitus without complication, with long-term current use of insulin  (HCC)   2. Hammertoe, bilateral         Plan:  Patient was evaluated and treated and all questions answered.  Discussed the etiology and treatment options for the condition in detail with the patient. Educated patient on the topical and oral treatment options for mycotic nails. Recommended debridement of the nails today. Sharp and mechanical debridement performed of all painful and mycotic nails today. Nails debrided in length and thickness using a nail nipper to level of comfort. Discussed treatment options including appropriate shoe gear. Follow up as needed for painful nails.  Hammertoes bilateral with underlying diabetes - All questions and concerns were discussed with the patient in extensive detail given the presence of hammertoe deformity with diabetes patient would benefit from diabetic shoes to decrease the recurrence of ulceration he states understanding to proceed  with diabetic shoes - Prescription for diabetic shoes was given    No follow-ups on file.

## 2024-08-03 ENCOUNTER — Encounter: Admitting: Family

## 2024-08-12 ENCOUNTER — Telehealth: Payer: Self-pay | Admitting: Family

## 2024-08-12 NOTE — Telephone Encounter (Signed)
 Called to confirm/remind patient of their appointment at the [facility] on 08/15/24.   Appointment:   [x] Confirmed  [] Left mess   [] No answer/No voice mail  [] VM Full/unable to leave message  [] Phone not in service  Patient reminded to bring all medications and/or complete list.  Confirmed patient has transportation. Gave directions, instructed to utilize valet parking.

## 2024-08-14 NOTE — Progress Notes (Unsigned)
 [facility] Note    PCP: Antone Bottoms, MD  Primary Cardiologist: Florencio Kava, MD (last seen 10/25) HF Cardiologist: Has seen Dr. Bensimhon [facility]) and Dr. Devore ([facility])  Chief Complaint: shortness of breath/ feels fluid up   HPI:  Oscar [name] is a 48 y/o male with a history of OSA/OHS on BiPAP, COPD, PAF, HTN, DM2, CKD 3a, hx VT, LBBB and chronic systolic heart failure s/p Medtronic dual chamber ICD 2016. Has h/o longstanding NICM.    Reports h/o MIs but cath 2016 with no CAD CRT-D in 2016 Echo 4/16 EF 25% Echo 10/19 EF 20-25% Echo 6/21 EF < 20% Myoview 6/20 EF 11% no scar or ischemia Echo 2/24 EF < 20% high sphericity index RV mildly down. Mild Oscar Echo 5/24: EF reported as 30-35%, RV okay, LVIDd 8.2 cm  Saw Dr. Bulah at [facility] in 5/24.    Admitted 06/12/23 w/ ICD shocks 2/2 VT. He was loaded with IV amiodarone  then transitioned to PO.  CPX at [facility] 2/25: sub-maximal test, likely moderate to severe functional impairment with peak VO2 8.9 ml/kg/min, slope 31, RER 0.90, mixed cardiopulmonary limitation to exercise, no pulmonary limitation to exercise.  Multiple admissions over the last couple of years with acute on chronic CHF. More recently he's had increasing difficultly tolerating GDMT and is now on midodrine .  Saw Dr. Debby at [facility] 9/25. His ICD is at [facility], planning for upgrade to CRT-D next month.  Admitted in 4/25 with acute on chronic respiratory failure in setting of COVID-19 infection and acute on chronic CHF He has been followed closely in HF clinic. Recently required addition of intermittent metolazone  last month.   He presents today for a HF follow-up visit with a chief complaint of worsening shortness of breath. Has associated fatigue, pedal edema, abdominal distention and weight gain. Sleeping ok. Asking if he can take a dose of metolazone  today prior to his CRT-D upgrade in 2 days. .   Having trouble tolerating his BiPAP at night, states  he slept better when he had NIV. Follows with Pulmonary. Wears 3L O2 continuously.  Has seen Dr. Florencio, Dr Merline and Dr Tobie since last OV here.   ROS: All systems negative except as listed in HPI, PMH and Problem List.  SH:  Social History   Socioeconomic History   Marital status: Single    Spouse name: Not on file   Number of children: 0   Years of education: Not on file   Highest education level: Some college, no degree  Occupational History   Occupation: unemployed  Tobacco Use   Smoking status: Former    Current packs/day: 0.00    Average packs/day: 1.5 packs/day for 34.0 years (51.0 ttl pk-yrs)    Types: Cigarettes    Quit date: 05/2023    Years since quitting: 1.2    Passive exposure: Past   Smokeless tobacco: Never   Tobacco comments:    Quit smoking 05/2023  Vaping Use   Vaping status: Never Used  Substance and Sexual Activity   Alcohol use: No   Drug use: No   Sexual activity: Yes  Other Topics Concern   Not on file  Social History Narrative   Live alone   Social Drivers of Health   Tobacco Use: Medium Risk (07/11/2024)   Patient History    Smoking Tobacco Use: Former    Smokeless Tobacco Use: Never    Passive Exposure: Past  Physicist, Medical Strain: Medium Risk (12/02/2023)   Overall  Financial Resource Strain (CARDIA)    Difficulty of Paying Living Expenses: Somewhat hard  Food Insecurity: No Food Insecurity (12/04/2023)   Hunger Vital Sign    Worried About Running Out of Food in the Last Year: Never true    Ran Out of Food in the Last Year: Never true  Recent Concern: Food Insecurity - Food Insecurity Present (11/23/2023)   Received [facility]   Epic    Within the past 12 months, you worried that your food would run out before you got the money to buy more.: Sometimes true    Within the past 12 months, the food you bought just didn't last and you didn't have money to get more.: Sometimes true  Transportation Needs: No  Transportation Needs (12/04/2023)   PRAPARE - Administrator, Civil Service (Medical): No    Lack of Transportation (Non-Medical): No  Physical Activity: Not on file  Stress: Not on file  Social Connections: Moderately Isolated (12/04/2023)   Social Connection and Isolation Panel    Frequency of Communication with Friends and Family: More than three times a week    Frequency of Social Gatherings with Friends and Family: Once a week    Attends Religious Services: More than 4 times per year    Active Member of [employer] or Organizations: No    Attends Banker Meetings: Never    Marital Status: Never married  Intimate Partner Violence: Not At Risk (12/04/2023)   Humiliation, Afraid, Rape, and Kick questionnaire    Fear of Current or Ex-Partner: No    Emotionally Abused: No    Physically Abused: No    Sexually Abused: No  Depression (PHQ2-9): Medium Risk (01/26/2024)   Depression (PHQ2-9)    PHQ-2 Score: 7  Alcohol Screen: Not on file  Housing: Low Risk (12/04/2023)   Housing Stability Vital Sign    Unable to Pay for Housing in the Last Year: No    Number of Times Moved in the Last Year: 0    Homeless in the Last Year: No  Recent Concern: Housing - High Risk (11/23/2023)   Received from [facility]   Epic    In the last 12 months, was there a time when you were not able to pay the mortgage or rent on time?: Yes    Number of Times Moved in the Last Year: Not on file    At any time in the past 12 months, were you homeless or living in a shelter (including now)?: No  Utilities: Not At Risk (12/04/2023)   AHC Utilities    Threatened with loss of utilities: No  Health Literacy: Not on file    FH:  Family History  Problem Relation Age of Onset   Hypertension Mother    Congestive Heart Failure Mother    Hypertension Sister    Diabetes Sister    Pancreatitis Sister    COPD Sister    Emphysema Sister        smoked   Pancreatitis Brother    Anemia Neg  Hx    Arrhythmia Neg Hx    Asthma Neg Hx    Clotting disorder Neg Hx    Fainting Neg Hx    Heart attack Neg Hx    Heart disease Neg Hx    Heart failure Neg Hx    Hyperlipidemia Neg Hx     Past Medical History:  Diagnosis Date   AICD (automatic cardioverter/defibrillator) present  Aortic atherosclerosis    Asthma    Chronic respiratory failure (HCC)    CKD (chronic kidney disease), stage III (HCC)    COPD (chronic obstructive pulmonary disease) (HCC)    Deafness in right ear    Diabetes mellitus without complication (HCC)    Dyspnea    GERD (gastroesophageal reflux disease)    Gout    HFrEF (heart failure with reduced ejection fraction) (HCC)    a.) TTE 12/10/14: EF 25%; diff inf HK; sev LV and mod LA dil; LVH. b.) TTE 06/23/18: EF 20-25%; LVH; mild LV dil, mild BAE. c.) TTE 03/01/19: EF 15%, LVH, BAE; triv PR/TR. d.) TTE 02/27/20: EF < 20%; sev LV dil; mild Oscar. e.) TTE 11/23/20: EF < 20%; glob HK; sev LV dil; LVH; mild-mod BAE; mod-sev TR, triv AR; G1DD. f.) TTE 05/14/21: EF <15%; LVH; sev LA and mild RV enlar; triv AR/PR, mild TR, mod Oscar.   Hiatal hernia    History of cardiac catheterization    a.) R/LHC 12/14/2009: normal coronaries. b.) R/LHC 12/11/2014: normal coronaries.   Hyperlipidemia    Hypertension    Hypoxemia    LBBB (left bundle branch block)    NICM (nonischemic cardiomyopathy; dilated cardiomyopathy) (HCC)    a.) R/LHC 12/14/2009: normal cors; LVEDP 18 mmHg, mean PA 29 mmHg, mean PCWP 31 mmHg; CO 6 L/min; CI 2.54 L/min/m. b.) TTE 12/10/2014: EF 25%. c.) R/LHC 12/11/2014: mean RA 9 mmHg, mean PA 22 mmHg, mean PCWP 22 mmHg. d.) TTE 06/23/2018: EF 20-25%. e.) TTE 03/01/2019: EF 15%. f.) TTE 02/27/2020: < 20%. g.) TTE 11/23/2020: EF <20%. h.) TTE 05/14/2021: < 15%.   NSTEMI (non-ST elevated myocardial infarction) (HCC)    a.) x 3 per patient report ---> 2012, 2014, 2016   Obesity    On supplemental oxygen by nasal cannula    a.) 2-3 L/[place]herty PRN   OSA on CPAP    PAF  (paroxysmal atrial fibrillation) (HCC)    a.) CHA2DS2-VASc = 4 (HFrEF, HTN, prior MI, T2DM). b.) rate/rhythm maintained without pharmacologial intervention; no current anticoagulation.   Pancreatitis    PSVT (paroxysmal supraventricular tachycardia)     Current Outpatient Medications  Medication Sig Dispense Refill   albuterol  (VENTOLIN  HFA) 108 (90 Base) MCG/ACT inhaler Inhale 2 puffs into the lungs every 6 (six) hours as needed for wheezing or shortness of breath. 8.5 g 6   allopurinol  (ZYLOPRIM ) 100 MG tablet Take 100 mg by mouth daily.     amiodarone  (PACERONE ) 200 MG tablet Take 1 tablet (200 mg total) by mouth daily.     atorvastatin  (LIPITOR ) 80 MG tablet Take 1 tablet (80 mg total) by mouth at bedtime. 90 tablet 3   colchicine  0.6 MG tablet Take 0.6 mg by mouth daily. (Patient taking differently: Take 0.6 mg by mouth daily as needed (gout).)     colchicine  0.6 MG tablet Take 1 tablet (0.6 mg total) by mouth daily. 60 tablet 0   doxycycline  (ADOXA) 100 MG tablet Take 100 mg by mouth 2 (two) times daily.     FARXIGA  10 MG TABS tablet Take 10 mg by mouth daily.     Fluticasone -Umeclidin-Vilant (TRELEGY ELLIPTA ) 200-62.5-25 MCG/ACT AEPB Inhale 1 puff into the lungs daily. 60 each 11   insulin  glargine (LANTUS ) 100 UNIT/ML injection Inject 15 Units into the skin daily.     metolazone  (ZAROXOLYN ) 2.5 MG tablet Take 1 tablet (2.5 mg total) by mouth every other day. (Patient not taking: Reported on 07/11/2024) 3 tablet  0   metolazone  (ZAROXOLYN ) 2.5 MG tablet Take 1 tablet (2.5 mg total) by mouth as directed. (Patient not taking: Reported on 07/11/2024) 5 tablet 0   midodrine  (PROAMATINE ) 5 MG tablet Take 1 tablet (5 mg total) by mouth 2 (two) times daily with a meal. Stop if SBP is >120     NON FORMULARY Pt uses a cpap nightly     pantoprazole  (PROTONIX ) 40 MG tablet Take 1 tablet (40 mg total) by mouth daily. (Patient not taking: Reported on 07/11/2024)     potassium chloride  SA (KLOR-CON  M)  20 MEQ tablet Take 2 tablets (40 mEq total) by mouth as directed. Take 2 tabs with every dose of metolazone  (Patient not taking: Reported on 07/11/2024) 15 tablet 0   torsemide  (DEMADEX ) 20 MG tablet Take 80 mg by mouth once. (Patient taking differently: Take 40 mg by mouth 2 (two) times daily.)     Current Facility-Administered Medications  Medication Dose Route Frequency Provider Last Rate Last Admin   furosemide  (LASIX ) injection 80 mg  80 mg Intravenous Once         There were no vitals filed for this visit.  Wt Readings from Last 3 Encounters:  07/11/24 (!) 312 lb (141.5 kg)  06/17/24 (!) 306 lb 12.8 oz (139.2 kg)  06/13/24 (!) 312 lb (141.5 kg)   Lab Results  Component Value Date   CREATININE 1.81 (H) 06/13/2024   CREATININE 1.89 (H) 06/01/2024   CREATININE 1.63 (H) 05/27/2024   PHYSICAL EXAM:  General: Well appearing.  Cor: No JVD although difficult to assess due to neck size. Regular rhythm, rate.  Lungs: clear Abdomen: soft, nontender, nondistended. Extremities: no edema Neuro:. Affect pleasant    Medtronic device interrogation: device interrogated but at time of note completion, information was not sent yet   ASSESSMENT & PLAN:  NICM with reduced ejection fraction- - Longstanding hx of NICM. No prior cath report available - Hx AICD placement in 2016 - Echo 03/01/2019: EF 15% - Echo 10/20/22: EF <20% - CPX 2/25: sub-maximal test, likely moderate to severe functional impairment with peak VO2 8.9 ml/kg/min, slope 31, RER 0.90, mixed cardiopulmonary limitation to exercise, no pulmonary limitation to exercise - Echo 12/01/23: EF 30-35%, RV okay, mild Oscar, LVIDd 8.2 cm - NYHA [name] III - euvolemic by exam although symptomatically he feels short of breath and reports home weight gain - ok to take 2.5mg  metolazone  X 1 - weight stable from last visit here 1 month ago - Has LBBB. Planning for CRT-D upgrade at [facility] in 2 days - GDMT limited by hypotension and CKD. Not  currently able to tolerate any GDMT. - Off farxiga  d/t hx groin infection, has diagnosis of hidradenitis suppurativa  - BNP 06/13/24 was 485.7  2. HTN - BP 98/74. Denies dizziness - BMET 06/13/24 reviewed: sodium 135, potassium 4.3, creatinine 1.81, GFR 46  3. OSA/OHS - Wears 3L O2 chronically and BiPAP at night. Recently completed titration study. - Continue to follow with pulmonary. Last seen 10/25  4. Diabetes (managed by PCP) - A1c 06/01/24 was 8.7% - continue lantus  15 units daily  - No SGLT2i w/ history of groin infections  5. PAF - No recent episodes on device - continue amiodarone  200mg  daily, follow thyroid  function and liver function studies - he has not wanted to take apixaban  and is aware of stroke risk - last eye exam ~ 03/25 - saw cardiology Philippe) 10/25  6. VT - Hx ICD shocks for VT in 10/24 -  Now on amiodarone  200  daily - No recent device discharges  7. LBBB - With wide QRS, > 170 ms - Scheduled for CRT-D upgrade at [facility] in 2 days   Return in 1 month, sooner if needed.   I spent 21 minutes reviewing records, interviewing/ examing patient and managing plan/ orders.   Oscar DELENA Class, FNP 08/14/2024

## 2024-08-15 ENCOUNTER — Encounter: Payer: Self-pay | Admitting: Family

## 2024-08-15 ENCOUNTER — Ambulatory Visit: Attending: Family | Admitting: Family

## 2024-08-15 VITALS — BP 99/68 | HR 85 | Wt 307.6 lb

## 2024-08-15 DIAGNOSIS — I10 Essential (primary) hypertension: Secondary | ICD-10-CM

## 2024-08-15 DIAGNOSIS — I5022 Chronic systolic (congestive) heart failure: Secondary | ICD-10-CM | POA: Diagnosis not present

## 2024-08-15 DIAGNOSIS — I447 Left bundle-branch block, unspecified: Secondary | ICD-10-CM | POA: Diagnosis not present

## 2024-08-15 DIAGNOSIS — E119 Type 2 diabetes mellitus without complications: Secondary | ICD-10-CM

## 2024-08-15 DIAGNOSIS — J449 Chronic obstructive pulmonary disease, unspecified: Secondary | ICD-10-CM | POA: Diagnosis not present

## 2024-08-15 DIAGNOSIS — I428 Other cardiomyopathies: Secondary | ICD-10-CM | POA: Diagnosis not present

## 2024-08-15 DIAGNOSIS — Z794 Long term (current) use of insulin: Secondary | ICD-10-CM

## 2024-08-15 DIAGNOSIS — I472 Ventricular tachycardia, unspecified: Secondary | ICD-10-CM

## 2024-08-15 DIAGNOSIS — G4733 Obstructive sleep apnea (adult) (pediatric): Secondary | ICD-10-CM | POA: Diagnosis not present

## 2024-08-15 DIAGNOSIS — Z9981 Dependence on supplemental oxygen: Secondary | ICD-10-CM | POA: Diagnosis not present

## 2024-08-15 DIAGNOSIS — Z87891 Personal history of nicotine dependence: Secondary | ICD-10-CM | POA: Diagnosis not present

## 2024-08-15 DIAGNOSIS — Z7984 Long term (current) use of oral hypoglycemic drugs: Secondary | ICD-10-CM | POA: Diagnosis not present

## 2024-08-15 DIAGNOSIS — I480 Paroxysmal atrial fibrillation: Secondary | ICD-10-CM

## 2024-08-15 DIAGNOSIS — R5383 Other fatigue: Secondary | ICD-10-CM | POA: Diagnosis present

## 2024-08-15 DIAGNOSIS — E1122 Type 2 diabetes mellitus with diabetic chronic kidney disease: Secondary | ICD-10-CM | POA: Diagnosis not present

## 2024-08-15 DIAGNOSIS — Z95810 Presence of automatic (implantable) cardiac defibrillator: Secondary | ICD-10-CM | POA: Diagnosis not present

## 2024-08-15 DIAGNOSIS — Z79899 Other long term (current) drug therapy: Secondary | ICD-10-CM | POA: Diagnosis not present

## 2024-08-15 DIAGNOSIS — I130 Hypertensive heart and chronic kidney disease with heart failure and stage 1 through stage 4 chronic kidney disease, or unspecified chronic kidney disease: Secondary | ICD-10-CM | POA: Diagnosis not present

## 2024-08-15 DIAGNOSIS — N1831 Chronic kidney disease, stage 3a: Secondary | ICD-10-CM | POA: Diagnosis not present

## 2024-08-15 NOTE — Patient Instructions (Signed)
 Medication Changes:  No medication changes today!  Lab Work:   Go downstairs to [facility] on [address]OWER [address]EV[address] to have your blood work completed.  We will only call you if the results are abnormal or if the provider would like to make medication changes.  No news is good news.    Referrals:  You have been referred to [facility]. They will contact you in order to schedule that appointment    Follow-Up in: Please follow up with the [facility] in 4 months with Oscar Class, FNP.   Thank you for choosing  [facility].    At the [facility], you and your health needs are our priority. We have a designated team specialized in the treatment of Heart Failure. This Care Team includes your primary Heart Failure Specialized Cardiologist (physician), Advanced Practice Providers (APPs- Physician Assistants and Nurse Practitioners), and Pharmacist who all work together to provide you with the care you need, when you need it.   You may see any of the following providers on your designated Care Team at your next follow up:  Dr. Toribio Fuel Dr. Ezra Shuck Dr. Ria Commander Dr. Morene Brownie Oscar Class, FNP Jaun Bash, RPH-CPP  Please be sure to bring in all your medications bottles to every appointment.   Need to Contact Us :  If you have any questions or concerns before your next appointment please send us  a message through [place] or call our office at [phone].    TO LEAVE A MESSAGE FOR THE NURSE SELECT OPTION 2, PLEASE LEAVE A MESSAGE INCLUDING: YOUR NAME DATE OF BIRTH CALL BACK NUMBER REASON FOR CALL**this is important as we prioritize the call backs  YOU WILL RECEIVE A CALL BACK THE SAME DAY AS LONG AS YOU CALL BEFORE 4:00 PM

## 2024-08-16 ENCOUNTER — Ambulatory Visit: Payer: Self-pay | Admitting: Family

## 2024-08-16 LAB — BASIC METABOLIC PANEL WITH GFR
BUN/Creatinine Ratio: 22 — ABNORMAL HIGH (ref 9–20)
BUN: 40 mg/dL — ABNORMAL HIGH (ref 6–24)
CO2: 23 mmol/L (ref 20–29)
Calcium: 9.1 mg/dL (ref 8.7–10.2)
Chloride: 95 mmol/L — ABNORMAL LOW (ref 96–106)
Creatinine, Ser: 1.8 mg/dL — ABNORMAL HIGH (ref 0.76–1.27)
Glucose: 282 mg/dL — ABNORMAL HIGH (ref 70–99)
Potassium: 4.2 mmol/L (ref 3.5–5.2)
Sodium: 141 mmol/L (ref 134–144)
eGFR: 46 mL/min/1.73 — ABNORMAL LOW (ref >59)

## 2024-08-16 LAB — MAGNESIUM: Magnesium: 2 mg/dL (ref 1.6–2.3)

## 2024-08-29 ENCOUNTER — Ambulatory Visit: Admitting: Pulmonary Disease

## 2024-08-29 ENCOUNTER — Encounter

## 2024-09-06 NOTE — Progress Notes (Signed)
 " Dermatology Note   Assessment and Plan:    Hidradenitis Suppurativa (HS): Hurley stage I/II (recurrent abscesses with tract formation and cicatrization [single or multiple widely separated lesions]) - chronic, flaring - Prior therapy: bimzelx (inadequate response), oral prednisone , repeat antibiotics courses - Discussed the chronic, relapsing nature of this skin disease, characterized by recurring inflamed painful nodules with abscess and sinus formation, and scarring - Explained to patient that usually early lesion can remit with medical treatment, but once sinus tracts are established treatment options are limited and surgery is the only definitive treatment; however, recurrence rate can be up to 25% even with surgery - Discussed option to trial Cosentyx; not a good candidate for TNF alpha inhibitors in s/o CHF - patient defers at this time and prefers surgery - Restart doxycycline  100 mg BID - Hold Augmentin  (amoxicillin -clavulanate 875-125mg  per tablet); Take 1 tablet by mouth two (2) times a day - Saw Dr. Levonia (urologist) who can do scrotal operation but wants to get clearance from cardiology first; patient is getting defibrillator in November so will wait until after that (clearance letter uploaded into media tab) - Recommended to see Dr Cyndee Shams for surgery on buttocks - placed referral previously but patient reports he does not have transportation to [place] or [place] area  - START chlorhexidine  (HIBICLENS ) 4 % external liquid; Use as wash daily to active areas of groin - START clindamycin  (CLEOCIN  T) 1 % lotion; Apply daily to active areas    The patient was advised to call for an appointment should any new, changing, or symptomatic lesions develop.   RTC: Return for Next scheduled follow up. or sooner as needed  _________________________________________________________________   Chief Complaint   Chief Complaint  Patient presents with   Chronic Condition Follow-Up     HS Painful flare up    HPI   Oscar [name] is a 49 y.o. male who presents as a returning patient (last seen 06/28/2024) to Dermatology for follow up of hidradenitis suppurativa.   History of Present Illness Oscar [name] is a 49 year old male with hidradenitis suppurativa who presents with groin pain and swelling.  He reports a year of groin pain and swelling with difficulty retracting the foreskin and significant discomfort. He has been on oral Augmentin  since his last visit with only partial relief and itching.  The area bleeds and drains daily and has required him to wear a pad every day for about a year. He finds the groin involvement most bothersome.  He notes doxycycline  previously worked better than Augmentin . He applies clindamycin  lotion after showering. He was prescribed Hempaclens for cleansing but needs a refill. He bathes with Dial soap.  He has an upcoming urology appointment this month. He requests a referral to a general surgeon closer to home because of transportation difficulties. He brought a letter from his cardiologist stating he is cleared for surgery.     FH:     Patient Mother Father Son Daughter Brother Sister Maternal Grandmother Maternal Grandfather Paternal Grandmother Paternal Grandfather Maternal Aunt Maternal Uncle Paternal Aunt Paternal Uncle Other:  Derm Hidradenitis Suppurativa   x                                  Pilonidal sinus  Acne   x                                  Dissecting cellulitis(Scalp)                                    Eczema                                     Allergies                                   Rheum Joint pains                                     SAPHO                                    Pyoderma gangrenosum                                    Back pain                                    Auto-immune disease                                     Asthma                                    Endo Polycystic ovarian syndrome                                     Thyroid  disease                                    Type 1 diabetes                                    Type 2 diabetes x                                  Vitamin D deficiency                                  Psych Anxiety                                    Depression  Dementia                                    Suicidal thoughts*                                  Cardio Hypertension                                    High cholesterol                                    Heart attack  x                                  Stroke                                    Hem-onc Cancer ______________                                    Anemia                                   GI IBD (UC/Crohn's)                                  ID HIV                                     Syphilis                                     Other                                        Social History: Current or former smoker? former Amount smoking: less than half ppd How many years:  ED visits in the last 5 years?  Difficulty affording medications?  Marital Status:  Living with some one?    Prior treatments: Topical: clindamycin  Systemic: Bimzelx, many courses of antibiotics Past surgical procedures: prior surgical intervention in groin Past laser procedures: none Past Medical History, Family History, Social History, Medication List, Allergies, and Problem List were reviewed in the rooming section of Epic.   ROS: Other than symptoms mentioned in the HPI, no fevers, chills, or other skin complaints  Physical Examination   GENERAL: Well-appearing male in no acute distress, resting comfortably. NEURO: Alert and oriented, answers questions appropriately PSYCH: Normal mood and affect RESP: No increased work of breathing SKIN: Examination of the buttocks and groin was performed - Scarred nodules of the medial  buttocks, 1 draining tunnel - Perineum/scrotum with 1 DS, 1 IN (  contiguous with buttocks tunnel - likely about 10-12 cm all together from posterior scrotum to just superior to the anus.  All areas not commented on are within normal limits or unremarkable   (Approved Template 05/14/2020) "

## 2024-09-09 ENCOUNTER — Encounter: Payer: Self-pay | Admitting: Urology

## 2024-09-09 ENCOUNTER — Ambulatory Visit: Admitting: Urology

## 2024-09-09 VITALS — BP 117/80 | HR 84 | Ht 66.0 in | Wt 314.0 lb

## 2024-09-09 DIAGNOSIS — N471 Phimosis: Secondary | ICD-10-CM | POA: Diagnosis not present

## 2024-09-09 NOTE — Progress Notes (Unsigned)
 "  09/09/2024 *** 9:17 AM   Oscar [name] 09-11-1975 [id number]  Referring provider: Ernie Yancy Roof, MD [address] [place],  [place] [address]  Chief Complaint  Patient presents with   Circumcision    Evaluation    HPI: Oscar [name] is a 49 y.o. male presents today to discuss circumcision.  Patient with chronic hidradenitis suppurativa states he has had difficulty retracting his foreskin causing fissuring in the foreskin that is tight Saw Dr. Levonia 05/23/2024; cardiology clearance recommended and if cleared to consider unroofing of scrotal/perineal tract He has been cleared by cardiology.  Dermatology had also recommended surgical evaluation for treatment of areas on the buttock and this is pending   PMH: Past Medical History:  Diagnosis Date   AICD (automatic cardioverter/defibrillator) present    Aortic atherosclerosis    Asthma    Chronic respiratory failure (HCC)    CKD (chronic kidney disease), stage III (HCC)    COPD (chronic obstructive pulmonary disease) (HCC)    Deafness in right ear    Diabetes mellitus without complication (HCC)    Dyspnea    GERD (gastroesophageal reflux disease)    Gout    HFrEF (heart failure with reduced ejection fraction) (HCC)    a.) TTE 12/10/14: EF 25%; diff inf HK; sev LV and mod LA dil; LVH. b.) TTE 06/23/18: EF 20-25%; LVH; mild LV dil, mild BAE. c.) TTE 03/01/19: EF 15%, LVH, BAE; triv PR/TR. d.) TTE 02/27/20: EF < 20%; sev LV dil; mild MR. e.) TTE 11/23/20: EF < 20%; glob HK; sev LV dil; LVH; mild-mod BAE; mod-sev TR, triv AR; G1DD. f.) TTE 05/14/21: EF <15%; LVH; sev LA and mild RV enlar; triv AR/PR, mild TR, mod MR.   Hiatal hernia    History of cardiac catheterization    a.) R/LHC 12/14/2009: normal coronaries. b.) R/LHC 12/11/2014: normal coronaries.   Hyperlipidemia    Hypertension    Hypoxemia    LBBB (left bundle branch block)    NICM (nonischemic cardiomyopathy; dilated cardiomyopathy) (HCC)    a.)  R/LHC 12/14/2009: normal cors; LVEDP 18 mmHg, mean PA 29 mmHg, mean PCWP 31 mmHg; CO 6 L/min; CI 2.54 L/min/m. b.) TTE 12/10/2014: EF 25%. c.) R/LHC 12/11/2014: mean RA 9 mmHg, mean PA 22 mmHg, mean PCWP 22 mmHg. d.) TTE 06/23/2018: EF 20-25%. e.) TTE 03/01/2019: EF 15%. f.) TTE 02/27/2020: < 20%. g.) TTE 11/23/2020: EF <20%. h.) TTE 05/14/2021: < 15%.   NSTEMI (non-ST elevated myocardial infarction) (HCC)    a.) x 3 per patient report ---> 2012, 2014, 2016   Obesity    On supplemental oxygen by nasal cannula    a.) 2-3 L/[place] PRN   OSA on CPAP    PAF (paroxysmal atrial fibrillation) (HCC)    a.) CHA2DS2-VASc = 4 (HFrEF, HTN, prior MI, T2DM). b.) rate/rhythm maintained without pharmacologial intervention; no current anticoagulation.   Pancreatitis    PSVT (paroxysmal supraventricular tachycardia)     Surgical History: Past Surgical History:  Procedure Laterality Date   BIOPSY  11/25/2021   Procedure: BIOPSY;  Surgeon: Wilhelmenia Aloha Raddle., MD;  Location: [facility];  Service: Gastroenterology;;   CARDIAC CATHETERIZATION  12/11/2014   Procedure: RIGHT/LEFT HEART CATH AND CORONARY ANGIOGRAPHY;  Surgeon: Dorn JINNY Lesches, MD;  Location: [facility] CATH LAB;  Service: Cardiovascular;;   COLONOSCOPY WITH PROPOFOL  N/A 11/13/2021   Procedure: COLONOSCOPY WITH PROPOFOL ;  Surgeon: Unk Corinn Skiff, MD;  Location: [facility] ENDOSCOPY;  Service: Gastroenterology;  Laterality: N/A;  ESOPHAGOGASTRODUODENOSCOPY (EGD) WITH PROPOFOL  N/A 11/25/2021   Procedure: ESOPHAGOGASTRODUODENOSCOPY (EGD) WITH PROPOFOL ;  Surgeon: Wilhelmenia Aloha Raddle., MD;  Location: [facility];  Service: Gastroenterology;  Laterality: N/A;   ICD LEAD REMOVAL N/A 03/30/2015   Procedure: ICD LEAD REMOVAL;  Surgeon: Franky LITTIE Ned, MD;  Location: [facility];  Service: Cardiovascular;  Laterality: N/A;   IMPLANTABLE CARDIOVERTER DEFIBRILLATOR IMPLANT     INCISION AND DRAINAGE ABSCESS N/A 12/12/2020   Procedure: INCISION AND DRAINAGE  ABSCESS;  Surgeon: Jordis Laneta FALCON, MD;  Location: [facility];  Service: General;  Laterality: N/A;   INSERT / REPLACE / REMOVE PACEMAKER     LEFT HEART CATHETERIZATION WITH CORONARY ANGIOGRAM N/A 12/09/2014   Procedure: LEFT HEART CATHETERIZATION WITH CORONARY ANGIOGRAM;  Surgeon: Lonni JONETTA Cash, MD;  Location: [facility] CATH LAB;  Service: Cardiovascular;  Laterality: N/A;   RIGHT/LEFT HEART CATH AND CORONARY ANGIOGRAPHY Bilateral 12/14/2009   Procedure: RIGHT/LEFT HEART CATH AND CORONARY ANGIOGRAPHY; Location: [facility]; Surgeon: Florencio Shine, MD   TONSILLECTOMY     as a child , adnoids removed   UPPER ESOPHAGEAL ENDOSCOPIC ULTRASOUND (EUS) N/A 11/25/2021   Procedure: UPPER ESOPHAGEAL ENDOSCOPIC ULTRASOUND (EUS);  Surgeon: Wilhelmenia Aloha Raddle., MD;  Location: [facility];  Service: Gastroenterology;  Laterality: N/A;    Home Medications:  Allergies as of 09/09/2024       Reactions   Ciprofloxacin  Swelling, Other (See Comments)   Migraine Headache   Iodinated Contrast Media    Other reaction(s): Vomiting Projectile vomiting   Isosorb Dinitrate-hydralazine  Other (See Comments)   Migraine Headache        Medication List        Accurate as of September 09, 2024  9:17 AM. If you have any questions, ask your nurse or doctor.          STOP taking these medications    midodrine  5 MG tablet Commonly known as: PROAMATINE  Stopped by: Glendia Barba, MD       TAKE these medications    albuterol  108 (90 Base) MCG/ACT inhaler Commonly known as: VENTOLIN  HFA Inhale 2 puffs into the lungs every 6 (six) hours as needed for wheezing or shortness of breath.   allopurinol  100 MG tablet Commonly known as: ZYLOPRIM  Take 100 mg by mouth daily.   amiodarone  200 MG tablet Commonly known as: PACERONE  Take 1 tablet (200 mg total) by mouth daily.   amoxicillin -clavulanate 875-125 MG tablet Commonly known as: AUGMENTIN  Take 1 tablet by mouth 2 (two) times daily.   atorvastatin  80 MG  tablet Commonly known as: LIPITOR  Take 1 tablet (80 mg total) by mouth at bedtime.   colchicine  0.6 MG tablet Take 0.6 mg by mouth daily. What changed:  when to take this reasons to take this   colchicine  0.6 MG tablet Take 1 tablet (0.6 mg total) by mouth daily. What changed: Another medication with the same name was changed. Make sure you understand how and when to take each.   Lantus  100 UNIT/ML injection Generic drug: insulin  glargine Inject 15 Units into the skin daily.   metolazone  2.5 MG tablet Commonly known as: ZAROXOLYN  Take 1 tablet (2.5 mg total) by mouth as directed.   metoprolol  succinate 25 MG 24 hr tablet Commonly known as: TOPROL -XL Take 25 mg by mouth daily.   NON FORMULARY Pt uses a cpap nightly   pantoprazole  40 MG tablet Commonly known as: PROTONIX  Take 1 tablet (40 mg total) by mouth daily.   torsemide  20 MG tablet Commonly known as: DEMADEX  Take  40 mg by mouth daily.   Trelegy Ellipta  200-62.5-25 MCG/ACT Aepb Generic drug: Fluticasone -Umeclidin-Vilant Inhale 1 puff into the lungs daily.        Allergies: Allergies[1]  Family History: Family History  Problem Relation Age of Onset   Hypertension Mother    Congestive Heart Failure Mother    Hypertension Sister    Diabetes Sister    Pancreatitis Sister    COPD Sister    Emphysema Sister        smoked   Pancreatitis Brother    Anemia Neg Hx    Arrhythmia Neg Hx    Asthma Neg Hx    Clotting disorder Neg Hx    Fainting Neg Hx    Heart attack Neg Hx    Heart disease Neg Hx    Heart failure Neg Hx    Hyperlipidemia Neg Hx     Social History:  reports that he quit smoking about 16 months ago. His smoking use included cigarettes. He has a 51 pack-year smoking history. He has been exposed to tobacco smoke. He has never used smokeless tobacco. He reports that he does not drink alcohol and does not use drugs.   Physical Exam: BP 117/80   Pulse 84   Ht 5' 6 (1.676 m)   Wt (!) 314  lb (142.4 kg)   BMI 50.68 kg/m   Constitutional:  Alert, No acute distress. HEENT: [place] AT Respiratory: Normal respiratory effort, no increased work of breathing. GU: Very tight prepuce and did not attempt to retract; large pannus with decreased penile length Psychiatric: Normal mood and affect.   Assessment & Plan:    1.  Phimosis Would not recommend circumcision with large pannus and the possibility of buried penis Could consider dorsal slit however will check with Dr. Levonia to see if any contraindication to dorsal slit with possible need for reconstruction in the future   Glendia JAYSON Barba, MD  [facility] [address] [place], [place] [address] [phone]     [1]  Allergies Allergen Reactions   Ciprofloxacin  Swelling and Other (See Comments)    Migraine Headache    Iodinated Contrast Media     Other reaction(s): Vomiting Projectile vomiting   Isosorb Dinitrate-Hydralazine  Other (See Comments)    Migraine Headache   "

## 2024-10-20 ENCOUNTER — Ambulatory Visit: Admitting: Podiatry

## 2024-12-12 ENCOUNTER — Ambulatory Visit: Admitting: Family
